# Patient Record
Sex: Female | Born: 1935 | Race: White | Hispanic: No | Smoking: Never smoker
Health system: Southern US, Community
[De-identification: ages and names within clinical notes are randomized; demographics above are authoritative.]

## PROBLEM LIST (undated history)

## (undated) DIAGNOSIS — M1990 Unspecified osteoarthritis, unspecified site: Secondary | ICD-10-CM

## (undated) DIAGNOSIS — Z8619 Personal history of other infectious and parasitic diseases: Secondary | ICD-10-CM

## (undated) DIAGNOSIS — Z85828 Personal history of other malignant neoplasm of skin: Secondary | ICD-10-CM

## (undated) DIAGNOSIS — R1031 Right lower quadrant pain: Secondary | ICD-10-CM

## (undated) DIAGNOSIS — D472 Monoclonal gammopathy: Secondary | ICD-10-CM

## (undated) DIAGNOSIS — I499 Cardiac arrhythmia, unspecified: Secondary | ICD-10-CM

## (undated) DIAGNOSIS — Z950 Presence of cardiac pacemaker: Secondary | ICD-10-CM

## (undated) DIAGNOSIS — K649 Unspecified hemorrhoids: Secondary | ICD-10-CM

## (undated) DIAGNOSIS — K219 Gastro-esophageal reflux disease without esophagitis: Secondary | ICD-10-CM

## (undated) DIAGNOSIS — D696 Thrombocytopenia, unspecified: Secondary | ICD-10-CM

## (undated) DIAGNOSIS — Z8489 Family history of other specified conditions: Secondary | ICD-10-CM

## (undated) DIAGNOSIS — C801 Malignant (primary) neoplasm, unspecified: Secondary | ICD-10-CM

## (undated) DIAGNOSIS — I5033 Acute on chronic diastolic (congestive) heart failure: Secondary | ICD-10-CM

## (undated) DIAGNOSIS — R002 Palpitations: Secondary | ICD-10-CM

## (undated) DIAGNOSIS — I10 Essential (primary) hypertension: Secondary | ICD-10-CM

## (undated) DIAGNOSIS — E039 Hypothyroidism, unspecified: Secondary | ICD-10-CM

## (undated) DIAGNOSIS — E785 Hyperlipidemia, unspecified: Secondary | ICD-10-CM

## (undated) DIAGNOSIS — D649 Anemia, unspecified: Secondary | ICD-10-CM

## (undated) DIAGNOSIS — I4891 Unspecified atrial fibrillation: Secondary | ICD-10-CM

## (undated) HISTORY — DX: Hyperlipidemia, unspecified: E78.5

## (undated) HISTORY — DX: Personal history of other infectious and parasitic diseases: Z86.19

## (undated) HISTORY — DX: Palpitations: R00.2

## (undated) HISTORY — DX: Essential (primary) hypertension: I10

## (undated) HISTORY — PX: CATARACT EXTRACTION: SUR2

## (undated) HISTORY — DX: Right lower quadrant pain: R10.31

## (undated) HISTORY — PX: INSERT / REPLACE / REMOVE PACEMAKER: SUR710

## (undated) HISTORY — DX: Monoclonal gammopathy: D47.2

## (undated) HISTORY — DX: Unspecified osteoarthritis, unspecified site: M19.90

## (undated) HISTORY — DX: Personal history of other malignant neoplasm of skin: Z85.828

## (undated) HISTORY — DX: Anemia, unspecified: D64.9

## (undated) HISTORY — DX: Acute on chronic diastolic (congestive) heart failure: I50.33

## (undated) HISTORY — DX: Thrombocytopenia, unspecified: D69.6

---

## 1994-11-14 HISTORY — PX: SHOULDER SURGERY: SHX246

## 1997-11-14 HISTORY — PX: CHOLECYSTECTOMY: SHX55

## 1998-07-14 ENCOUNTER — Other Ambulatory Visit: Admission: RE | Admit: 1998-07-14 | Discharge: 1998-07-14 | Payer: Self-pay | Admitting: Obstetrics and Gynecology

## 2000-01-20 ENCOUNTER — Emergency Department (HOSPITAL_COMMUNITY): Admission: EM | Admit: 2000-01-20 | Discharge: 2000-01-20 | Payer: Self-pay

## 2000-04-18 ENCOUNTER — Ambulatory Visit (HOSPITAL_COMMUNITY): Admission: RE | Admit: 2000-04-18 | Discharge: 2000-04-18 | Payer: Self-pay | Admitting: Gastroenterology

## 2002-04-16 ENCOUNTER — Encounter: Admission: RE | Admit: 2002-04-16 | Discharge: 2002-07-15 | Payer: Self-pay | Admitting: Orthopedic Surgery

## 2004-11-03 ENCOUNTER — Emergency Department (HOSPITAL_COMMUNITY): Admission: EM | Admit: 2004-11-03 | Discharge: 2004-11-03 | Payer: Self-pay | Admitting: *Deleted

## 2006-03-03 ENCOUNTER — Encounter: Admission: RE | Admit: 2006-03-03 | Discharge: 2006-03-03 | Payer: Self-pay | Admitting: Orthopedic Surgery

## 2006-08-14 ENCOUNTER — Ambulatory Visit: Payer: Self-pay | Admitting: Internal Medicine

## 2006-08-31 ENCOUNTER — Ambulatory Visit: Payer: Self-pay

## 2006-08-31 ENCOUNTER — Encounter: Payer: Self-pay | Admitting: Cardiology

## 2006-09-05 ENCOUNTER — Ambulatory Visit: Payer: Self-pay | Admitting: Internal Medicine

## 2006-12-11 ENCOUNTER — Ambulatory Visit: Payer: Self-pay | Admitting: Internal Medicine

## 2006-12-18 ENCOUNTER — Ambulatory Visit: Payer: Self-pay

## 2007-08-30 ENCOUNTER — Ambulatory Visit: Payer: Self-pay | Admitting: Internal Medicine

## 2007-11-19 ENCOUNTER — Ambulatory Visit: Payer: Self-pay | Admitting: Internal Medicine

## 2007-11-19 LAB — CONVERTED CEMR LAB
Alkaline Phosphatase: 70 units/L (ref 39–117)
Bilirubin, Direct: 0.2 mg/dL (ref 0.0–0.3)
Cholesterol: 179 mg/dL (ref 0–200)
LDL Cholesterol: 65 mg/dL (ref 0–99)
Total Bilirubin: 0.7 mg/dL (ref 0.3–1.2)
Total CHOL/HDL Ratio: 1.8
Total Protein: 6.9 g/dL (ref 6.0–8.3)
Triglycerides: 70 mg/dL (ref 0–149)

## 2008-01-10 ENCOUNTER — Ambulatory Visit: Payer: Self-pay

## 2008-01-25 ENCOUNTER — Ambulatory Visit: Payer: Self-pay

## 2008-01-25 LAB — CONVERTED CEMR LAB: TSH: 3.32 microintl units/mL (ref 0.35–5.50)

## 2008-02-05 ENCOUNTER — Ambulatory Visit: Payer: Self-pay | Admitting: Internal Medicine

## 2008-03-04 ENCOUNTER — Ambulatory Visit: Payer: Self-pay | Admitting: Internal Medicine

## 2008-04-10 ENCOUNTER — Ambulatory Visit: Payer: Self-pay | Admitting: Internal Medicine

## 2008-11-13 ENCOUNTER — Ambulatory Visit: Payer: Self-pay | Admitting: Internal Medicine

## 2008-11-19 ENCOUNTER — Ambulatory Visit: Payer: Self-pay | Admitting: Internal Medicine

## 2008-11-19 LAB — CONVERTED CEMR LAB
ALT: 14 units/L (ref 0–35)
AST: 15 units/L (ref 0–37)
Albumin: 3.6 g/dL (ref 3.5–5.2)
Alkaline Phosphatase: 74 units/L (ref 39–117)
Bilirubin, Direct: 0.2 mg/dL (ref 0.0–0.3)
Cholesterol: 182 mg/dL (ref 0–200)
Total Protein: 6.7 g/dL (ref 6.0–8.3)
Triglycerides: 76 mg/dL (ref 0–149)

## 2008-12-01 ENCOUNTER — Ambulatory Visit: Payer: Self-pay | Admitting: Cardiology

## 2008-12-02 ENCOUNTER — Observation Stay (HOSPITAL_COMMUNITY): Admission: EM | Admit: 2008-12-02 | Discharge: 2008-12-02 | Payer: Self-pay | Admitting: Emergency Medicine

## 2008-12-02 ENCOUNTER — Encounter: Payer: Self-pay | Admitting: Cardiovascular Disease

## 2008-12-15 DIAGNOSIS — E785 Hyperlipidemia, unspecified: Secondary | ICD-10-CM | POA: Insufficient documentation

## 2008-12-15 DIAGNOSIS — I10 Essential (primary) hypertension: Secondary | ICD-10-CM

## 2008-12-22 ENCOUNTER — Ambulatory Visit: Payer: Self-pay | Admitting: Internal Medicine

## 2009-01-13 ENCOUNTER — Ambulatory Visit: Payer: Self-pay

## 2009-05-29 ENCOUNTER — Encounter: Payer: Self-pay | Admitting: Internal Medicine

## 2009-06-01 ENCOUNTER — Encounter: Admission: RE | Admit: 2009-06-01 | Discharge: 2009-06-01 | Payer: Self-pay | Admitting: Family Medicine

## 2009-07-29 ENCOUNTER — Encounter (INDEPENDENT_AMBULATORY_CARE_PROVIDER_SITE_OTHER): Payer: Self-pay | Admitting: *Deleted

## 2009-09-18 ENCOUNTER — Encounter: Payer: Self-pay | Admitting: Internal Medicine

## 2009-09-21 ENCOUNTER — Ambulatory Visit: Payer: Self-pay | Admitting: Internal Medicine

## 2010-01-13 ENCOUNTER — Encounter: Payer: Self-pay | Admitting: Internal Medicine

## 2010-01-13 DIAGNOSIS — I6529 Occlusion and stenosis of unspecified carotid artery: Secondary | ICD-10-CM

## 2010-01-14 ENCOUNTER — Encounter: Payer: Self-pay | Admitting: Internal Medicine

## 2010-01-14 ENCOUNTER — Ambulatory Visit: Payer: Self-pay

## 2010-01-27 ENCOUNTER — Telehealth (INDEPENDENT_AMBULATORY_CARE_PROVIDER_SITE_OTHER): Payer: Self-pay | Admitting: *Deleted

## 2010-02-08 ENCOUNTER — Telehealth: Payer: Self-pay | Admitting: Internal Medicine

## 2010-08-16 ENCOUNTER — Ambulatory Visit: Payer: Self-pay | Admitting: Internal Medicine

## 2010-08-16 DIAGNOSIS — R079 Chest pain, unspecified: Secondary | ICD-10-CM | POA: Insufficient documentation

## 2010-08-16 DIAGNOSIS — R0602 Shortness of breath: Secondary | ICD-10-CM

## 2010-08-17 ENCOUNTER — Telehealth (INDEPENDENT_AMBULATORY_CARE_PROVIDER_SITE_OTHER): Payer: Self-pay | Admitting: *Deleted

## 2010-08-25 ENCOUNTER — Telehealth (INDEPENDENT_AMBULATORY_CARE_PROVIDER_SITE_OTHER): Payer: Self-pay | Admitting: Radiology

## 2010-08-26 ENCOUNTER — Ambulatory Visit: Payer: Self-pay | Admitting: Internal Medicine

## 2010-08-26 ENCOUNTER — Encounter: Payer: Self-pay | Admitting: Cardiology

## 2010-08-26 ENCOUNTER — Encounter (HOSPITAL_COMMUNITY)
Admission: RE | Admit: 2010-08-26 | Discharge: 2010-09-10 | Payer: Self-pay | Source: Home / Self Care | Attending: Internal Medicine | Admitting: Internal Medicine

## 2010-08-26 ENCOUNTER — Ambulatory Visit: Payer: Self-pay

## 2010-08-26 ENCOUNTER — Ambulatory Visit: Payer: Self-pay | Admitting: Cardiology

## 2010-08-31 ENCOUNTER — Telehealth: Payer: Self-pay | Admitting: Internal Medicine

## 2010-12-05 ENCOUNTER — Encounter: Payer: Self-pay | Admitting: Orthopedic Surgery

## 2010-12-16 NOTE — Progress Notes (Signed)
Summary: REFILL/ has been filled by pr   Phone Note Refill Request Message from:  Patient on February 08, 2010 11:11 AM  Refills Requested: Medication #1:  SYNTHROID 75 MCG TABS 1 tab once daily [facility] [id number] OPT 2  Initial call taken by: Judie Grieve,  February 08, 2010 11:11 AM  Follow-up for Phone Call        pt calling again, meds are running out, please call pt once sent, Migdalia Dk  February 09, 2010 11:39 AM   per pt calling stating that dr. [name] has been filled this meds. [phone] Lorne Skeens  February 10, 2010 11:42 AM     Prescriptions: SYNTHROID 75 MCG TABS (LEVOTHYROXINE SODIUM) 1 tab once daily  #90 x 3   Entered by:   Layne Benton, RN, BSN   Authorized by:   Sherrill Raring, MD, [facility]   Signed by:   Layne Benton, RN, BSN on 02/10/2010   Method used:   Electronically to        [facility] [facility]* (mail-order)             ,          Ph: [phone]       Fax: [phone]   RxID:   [id number]

## 2010-12-16 NOTE — Assessment & Plan Note (Signed)
Summary: Cardiology Nuclear Testing  Nuclear Med Background Indications for Stress Test: Evaluation for Ischemia   History: Echo, Myocardial Perfusion Study  History Comments: '07 NML MPI , 1/10 ECHO NML EF  Symptoms: Chest Tightness, Palpitations, SOB    Nuclear Pre-Procedure Cardiac Risk Factors: Carotid Disease, Family History - CAD, Hypertension, Lipids Caffeine/Decaff Intake: None NPO After: 8:30 PM Lungs: clear IV 0.9% NS with Angio Cath: 22g     IV Site: R Forearm IV Started by: Irean Hong, RN Chest Size (in) 42     Cup Size C     Height (in): 59 Weight (lb): 168 BMI: 34.05 Tech Comments: Held carvedilol 24 hrs.  Nuclear Med Study 1 or 2 day study:  1 day     Stress Test Type:  Eugenie Birks Reading MD:  Olga Millers, MD     Referring MD:  P.Ross Resting Radionuclide:  Technetium 69m Tetrofosmin     Resting Radionuclide Dose:  10.8 mCi  Stress Radionuclide:  Technetium 66m Tetrofosmin     Stress Radionuclide Dose:  33.0 mCi   Stress Protocol   Lexiscan: 0.4 mg   Stress Test Technologist:  Milana Na, EMT-P     Nuclear Technologist:  Domenic Polite, CNMT  Rest Procedure  Myocardial perfusion imaging was performed at rest 45 minutes following the intravenous administration of Technetium 85m Tetrofosmin.  Stress Procedure  The patient received IV Lexiscan 0.4 mg over 15-seconds.  Technetium 47m Tetrofosmin injected at 30-seconds.  There were no significant changes and freq pvcs/VBigemeny with infusion.  Quantitative spect images were obtained after a 45 minute delay.  QPS Raw Data Images:  Acquisition technically good; normal left ventricular size. Stress Images:  There is decreased uptake in the anterior wall. Rest Images:  There is decreased uptake in the anterior wall. Subtraction (SDS):  No evidence of ischemia. Transient Ischemic Dilatation:  1.09  (Normal <1.22)  Lung/Heart Ratio:  .30  (Normal <0.45)  Quantitative Gated Spect Images QGS cine  images:  Not gated   Overall Impression  Exercise Capacity: Lexiscan with no exercise. BP Response: Normal blood pressure response. Clinical Symptoms: Mild chest pain/dyspnea. ECG Impression: No significant ST segment change suggestive of ischemia. Overall Impression: Normal lexiscan nuclear study with soft tissue attenuation but no ischemia.  Appended Document: Cardiology Nuclear Testing No evidence of ischemia on myoview.  Chest pains do not appear to be due to blood flow problem  Appended Document: Cardiology Nuclear Testing Called patient with results.

## 2010-12-16 NOTE — Assessment & Plan Note (Signed)
Summary: ROV SL  Medications Added * I CAPS 1 tab once daily      Allergies Added: NKDA  Visit Type:  Follow-up   History of Present Illness: Patient is a 77 year old with a history of PVCs, aortic sclerosis and dyslipidemia.  I last saw her in November of last year SInce seen she notes occasion chest tightness.  Also complains of back pains.  Notes some SOB.  No significant palpitations.  Current Medications (verified): 1)  Aspirin 81 Mg Tbec (Aspirin) .... Take One Tablet By Mouth Daily 2)  Synthroid 75 Mcg Tabs (Levothyroxine Sodium) .Marland Kitchen.. 1 Tab Once Daily 3)  Quinapril Hcl 40 Mg Tabs (Quinapril Hcl) .Marland Kitchen.. 1 Tab Once Daily 4)  Caltrate .Marland Kitchen.. 1 Tab Two Times A Day 5)  Simvastatin 20 Mg Tabs (Simvastatin) .... Take One Tablet By Mouth Daily At Bedtime 6)  Carvedilol 3.125 Mg Tabs (Carvedilol) .... Take One Tablet By Mouth Twice A Day 7)  Nitrostat 0.4 Mg Subl (Nitroglycerin) .Marland Kitchen.. 1 Tablet Under Tongue At Onset of Chest Pain; You May Repeat Every 5 Minutes For Up To 3 Doses. 8)  I Caps .Marland Kitchen.. 1 Tab Once Daily  Allergies (verified): No Known Drug Allergies  Past History:  Past Medical History: Dyslipidemia Hypertension Palpitations (PVCs/bigeminy on event) Chest pain CV disease (moderate by USN 01/2010)  Review of Systems       All systems reviewed.  Neg to the above problem.  Vital Signs:  Patient profile:   77 year old female Height:      59 inches Weight:      169 pounds BMI:     34.26 Pulse rate:   65 / minute BP sitting:   132 / 60  (left arm) Cuff size:   regular  Vitals Entered By: Burnett Kanaris, CNA (August 16, 2010 11:29 AM)  Physical Exam  Additional Exam:  Patient is in NAD HEENT:  Normocephalic, atraumatic. EOMI, PERRLA.  Neck: JVP is normal. No thyromegaly. No bruits.  Lungs: clear to auscultation. No rales no wheezes.  Heart: Frequent skips.   Normal S1, S2. No S3.   No significant murmurs. PMI not displaced.  Abdomen:  Supple, nontender. Normal  bowel sounds. No masses. No hepatomegaly.  Extremities:   Good distal pulses throughout. No lower extremity edema.  Musculoskeletal :moving all extremities.  Neuro:   alert and oriented x3.    EKG  Procedure date:  08/16/2010  Findings:      NSR.  65 bpm.  First degree AV block.  Ventricular bigeminy.  Impression & Recommendations:  Problem # 1:  SHORTNESS OF BREATH (ICD-786.05) Patient wih some chst tightness and SOB.  I would recom a stress myoview as well as a holter monitor to evlauted.  Keep on same medicines for now.    Problem # 2:  CAROTID ARTERY DISEASE (ICD-433.10) Stable   FL/U in 2 years. Her updated medication list for this problem includes:    Aspirin 81 Mg Tbec (Aspirin) .Marland Kitchen... Take one tablet by mouth daily  Problem # 3:  HYPERTENSION, BENIGN (ICD-401.1) Good control. Her updated medication list for this problem includes:    Aspirin 81 Mg Tbec (Aspirin) .Marland Kitchen... Take one tablet by mouth daily    Quinapril Hcl 40 Mg Tabs (Quinapril hcl) .Marland Kitchen... 1 tab once daily    Carvedilol 3.125 Mg Tabs (Carvedilol) .Marland Kitchen... Take one tablet by mouth twice a day  Problem # 4:  HYPERLIPIDEMIA-MIXED (ICD-272.4) Keep on same regimen. Her updated medication  list for this problem includes:    Simvastatin 20 Mg Tabs (Simvastatin) .Marland Kitchen... Take one tablet by mouth daily at bedtime  Other Orders: EKG w/ Interpretation (93000) Holter (Holter) Nuclear Stress Test (Nuc Stress Test)  Patient Instructions: 1)  Your physician has recommended that you wear a holter monitor.  Holter monitors are medical devices that record the heart's electrical activity. Doctors most often use these monitors to diagnose arrhythmias. Arrhythmias are problems with the speed or rhythm of the heartbeat. The monitor is a small, portable device. You can wear one while you do your normal daily activities. This is usually used to diagnose what is causing palpitations/syncope (passing out). 2)  Your physician has requested  that you have a lexiscan myoview.  For further information please visit [email or web address]  Please follow instruction sheet, as given. 3)  Your physician wants you to follow-up in: 6 months  You will receive a reminder letter in the mail two months in advance. If you don't receive a letter, please call our office to schedule the follow-up appointment.

## 2010-12-16 NOTE — Progress Notes (Signed)
Summary: test results   Phone Note Call from Patient   Caller: Patient Reason for Call: Talk to Nurse, Lab or Test Results Summary of Call: pt wants to know results from monitor and xray. Initial call taken by: Roe Coombs,  August 31, 2010 3:34 PM  Follow-up for Phone Call        Spoke with pt. Pt. states she is calling to get the 24 hrs monitor results. Monitor was d/c last friday 10/14th/11 . I let pt. know Dr. [name] needs to review the monitor test results. We will call her back as soon as she does. The xrays pt is refering to, is the pictures taken during the stress test. Pt. aware. verbalized understanding. Follow-up by: Ollen Gross, RN, BSN,  August 31, 2010 4:16 PM     Appended Document: test results Called patient with Holter monitor results.

## 2010-12-16 NOTE — Progress Notes (Signed)
Summary: NUC PRE-PROCEDURE  Phone Note Outgoing Call   Call placed by: Domenic Polite, CNMT,  August 25, 2010 12:14 PM Call placed to: Patient Reason for Call: Confirm/change Appt Summary of Call: Reviewed information on Myoview Information Sheet (see scanned document for further details).  Spoke with patient.  Initial call taken by: Domenic Polite, CNMT,  August 25, 2010 12:14 PM     Nuclear Med Background Indications for Stress Test: Evaluation for Ischemia   History: Echo, Myocardial Perfusion Study  History Comments: '07 NML MPI , 1/10 ECHO NML EF  Symptoms: Chest Tightness, Palpitations, SOB    Nuclear Pre-Procedure Cardiac Risk Factors: Carotid Disease, Family History - CAD, Hypertension, Lipids Height (in): 59 Tech Comments: 3/11 40-59% RICA, 0-39% LICA

## 2010-12-16 NOTE — Progress Notes (Signed)
Summary: 24 hour holter monitor  Phone Note Outgoing Call Call back at Home Phone [phone]   Call placed by: Stanton Kidney, EMT-P,  August 17, 2010 4:53 PM Call placed to: Patient Action Taken: Phone Call Completed Summary of Call: Pt. scheduled for 24 holter monitor 08/26/10 after myoview appt.

## 2010-12-16 NOTE — Miscellaneous (Signed)
Summary: Orders Update  Clinical Lists Changes  Problems: Added new problem of CAROTID ARTERY DISEASE (ICD-433.10) Orders: Added new Test order of Carotid Duplex (Carotid Duplex) - Signed 

## 2010-12-16 NOTE — Progress Notes (Signed)
Summary: refill meds  Will check with Durwin Reges on 3.17,2011  to see if we filling synthroid. Phone Note Refill Request Call back at Home Phone [phone] Message from:  Patient on January 27, 2010 11:31 AM  Refills Requested: Medication #1:  SYNTHROID 75 MCG TABS 1 tab once daily   Supply Requested: 3 months [facility]    Method Requested: Fax to [facility] Initial call taken by: Lorne Skeens,  January 27, 2010 11:32 AM

## 2011-02-28 LAB — GLUCOSE, CAPILLARY

## 2011-02-28 LAB — CARDIAC PANEL(CRET KIN+CKTOT+MB+TROPI)
CK, MB: 2.9 ng/mL (ref 0.3–4.0)
Total CK: 41 U/L (ref 7–177)

## 2011-02-28 LAB — TROPONIN I: Troponin I: 0.19 ng/mL — ABNORMAL HIGH (ref 0.00–0.06)

## 2011-02-28 LAB — POCT I-STAT, CHEM 8
Calcium, Ion: 1.25 mmol/L (ref 1.12–1.32)
Chloride: 109 mEq/L (ref 96–112)
HCT: 46 % (ref 36.0–46.0)
Sodium: 138 mEq/L (ref 135–145)

## 2011-02-28 LAB — CBC
Hemoglobin: 14.4 g/dL (ref 12.0–15.0)
MCHC: 32.7 g/dL (ref 30.0–36.0)
Platelets: 178 10*3/uL (ref 150–400)
RDW: 13.1 % (ref 11.5–15.5)

## 2011-02-28 LAB — POCT CARDIAC MARKERS
Myoglobin, poc: 65.9 ng/mL (ref 12–200)
Troponin i, poc: 0.09 ng/mL (ref 0.00–0.09)
Troponin i, poc: <0.05 ng/mL (ref 0.00–0.09)

## 2011-02-28 LAB — CK TOTAL AND CKMB (NOT AT ARMC)
CK, MB: 2.5 ng/mL (ref 0.3–4.0)
Relative Index: INVALID (ref 0.0–2.5)

## 2011-02-28 LAB — DIFFERENTIAL
Basophils Absolute: 0 10*3/uL (ref 0.0–0.1)
Basophils Relative: 0 % (ref 0–1)
Lymphocytes Relative: 4 % — ABNORMAL LOW (ref 12–46)
Monocytes Absolute: 0 10*3/uL — ABNORMAL LOW (ref 0.1–1.0)
Neutro Abs: 9.1 10*3/uL — ABNORMAL HIGH (ref 1.7–7.7)
Neutrophils Relative %: 96 % — ABNORMAL HIGH (ref 43–77)

## 2011-02-28 LAB — TSH: TSH: 0.649 u[IU]/mL (ref 0.350–4.500)

## 2011-03-02 ENCOUNTER — Other Ambulatory Visit: Payer: Self-pay | Admitting: Orthopedic Surgery

## 2011-03-02 DIAGNOSIS — R52 Pain, unspecified: Secondary | ICD-10-CM

## 2011-03-05 ENCOUNTER — Other Ambulatory Visit: Payer: Self-pay

## 2011-03-07 ENCOUNTER — Ambulatory Visit
Admission: RE | Admit: 2011-03-07 | Discharge: 2011-03-07 | Disposition: A | Discharge status: Home / Self Care | Payer: Medicare Other | Source: Ambulatory Visit | Attending: Orthopedic Surgery | Admitting: Orthopedic Surgery

## 2011-03-07 DIAGNOSIS — R52 Pain, unspecified: Secondary | ICD-10-CM

## 2011-03-08 ENCOUNTER — Other Ambulatory Visit: Payer: Self-pay

## 2011-03-26 ENCOUNTER — Other Ambulatory Visit: Payer: Self-pay | Admitting: Internal Medicine

## 2011-03-29 NOTE — Assessment & Plan Note (Signed)
[facility]                            CARDIOLOGY OFFICE NOTE   NAME:[name], [name]                  MRN:          [id number]  DATE:04/10/2008                            DOB:          1925/04/01    IDENTIFICATION:  The patient is an 77 year old woman with a history of  palpitations (PVCs/bigeminy on event monitor), chest pressure, shortness  of breath.  I last saw her back in March.  Note, she has had a normal  Myoview and echocardiogram.  Echo showed only mild mitral regurgitation.   Since seen, the patient has done well.  She denies palpitations,  actually.  No shortness of breath.  Notes occasional lower extremity  edema, she does have varicosities.   CURRENT MEDICINES:  1. Aspirin 81.  2. Synthroid 75 mcg.  3. Quinapril 40.  4. Zocor 20.  5. Caltrate b.i.d.   PHYSICAL EXAMINATION:  The patient is in no distress.  Blood pressure is 164/84 (patient was stressed because of traffic this  morning), pulse is 63 and regular, weight 179.  Her lungs are clear.  CARDIAC EXAM:  Regular rate and rhythm, S1, S2, no S3, no murmurs.  ABDOMEN:  Benign, obese.  EXTREMITIES:  No edema.   A 12-lead EKG normal sinus rhythm, 63 beats per minute with PR intervals  232 ms.   IMPRESSION:  1. Palpitations:  Event monitor showed isolated premature ventricular      contractions, occasional bigeminy.  The patient is asymptomatic, I      would not treat further unless this changes.  Average heart rate on      Holter monitor was 68.  Again, anything could slow her down and      make her feel worse potentially.  I do not think that in the      setting of normal left ventricular function, there is any      significant concern for these premature ventricular contractions.  2. History of chest pressure:  Again, normal Myoview.  Patient      currently asymptomatic.  3. Dyslipidemia:  Good control on statin.   Overall, I think the patient is doing well.  I will  set to see her back  in the winter.  She is being evaluated for cataract surgery.  I think  from a cardiac standpoint she has a low risk for any cardiac event.     Pricilla Riffle, MD, [facility]  Electronically Signed    PVR/MedQ  DD: 04/10/2008  DT: 04/10/2008  Job #: [id number]   cc:   Carin Hock. Epes, M.D.  [facility]

## 2011-03-29 NOTE — Assessment & Plan Note (Signed)
[facility]                            CARDIOLOGY OFFICE NOTE   NAME:[name], [name]                  MRN:          [id number]  DATE:01/25/2008                            DOB:          1925-06-20    IDENTIFICATION:  [name] is an 77 year old woman with a history of  palpitations/PVCs, chest pressure and shortness of breath I last saw her  back in October.  The patient also has a history of moderate  cerebrovascular disease.   Since seen, she had an episode of chest pain on Friday.  She went to bed  and woke up on Saturday and her chest was still hurting though she said  it was not as bad.  She went to the Urgent Care ([facility]) on  Saturday. An EKG was done that was unchanged from previous.  They felt  it was probably some gas pain and she was sent home.   She still gets occasional chest and back pain that is intermittent,  occurs with and without activity.  She has noted no change in her  ability to do things.  Her breathing has been okay.  She notes  occasional palpitations, worse in the middle of night if she gets up to  go to the bathroom.   CURRENT MEDICATIONS:  Include aspirin 81, Synthroid 75, quinapril 40,  Caltrate b.i.d. and Zocor 20 q.h.s.   PHYSICAL EXAM:  The patient is in no distress.  Blood pressure is  130/80, pulse is 60 and regular, weight 180 up 3 pounds from previous.  LUNGS are clear.  CARDIAC exam regular rate rhythm, S1-S2 no S3, occasional skip noted.  No murmurs.  ABDOMEN:  Benign.  EXTREMITIES:  No edema.  Varicosities noted.   Carotid studies done 02/26 showed stable mild carotid disease with a 40-  59% bilateral ICA stenosis.  Recommend follow-up in 1 year.  12-lead EKG  shows sinus rhythm, first-degree AV block with a PR interval 224  milliseconds.  Occasional PVCs.   IMPRESSION:  1. Chest pain.  Again I am not convinced it is cardiac. It was      atypical.  She has noted no change in this plan on risk  factor      modification.  2. Palpitations.  Will set up for a 24-hour Holter.  3. CV disease.  Continue to follow up on a yearly basis as needed.  4. Dyslipidemia.  Lipid panel done this January showed great control      with an LDL of 65, HDL of 100, total cholesterol 179.   The patient was given a follow-up in October and a refill prescription  for nitroglycerine. If her symptoms change or worsen I would be happy to  see her sooner.     Pricilla Riffle, MD, [facility]  Electronically Signed    PVR/MedQ  DD: 01/27/2008  DT: 01/27/2008  Job #: [id number]   cc:   [facility] in [place]

## 2011-03-29 NOTE — Assessment & Plan Note (Signed)
[facility]                            CARDIOLOGY OFFICE NOTE   NAME:Simmon, SHANEL PRAZAK                  MRN:          [id number]  DATE:12/22/2008                            DOB:          1925-10-09    IDENTIFICATION:  Ms. Gramajo is an 77 year old woman with a history of  palpitations, chest pressure, dyspnea.  I last saw her in December 31.   In the interval, the patient was actually admitted to the Inpatient  Cardiology Service for palpitations, this was on January 18.  She had a  steroid shot the day prior and around 5 p.m., she was at her daughter's  house when she began having more palpitations with chest tightness  became persistent and she was brought to the emergency room.  There in  the emergency room, systolic blood pressure was elevated at 160.  Her  blood glucose was elevated at 240.  She was observed and sent home on  January 18.  On January 19, Coreg was added to her regimen.   Since discharge, she is back towards baseline.  She has only  intermittent palpitations.   CURRENT MEDICATIONS:  1. Aspirin 81.  2. Synthroid 75.  3. Quinapril 40.  4. Zocor 20.  5. Caltrate b.i.d.  6. Carvedilol 3.125 b.i.d.  7. Eye drops.   REVIEW OF SYSTEMS:  Denies dizziness, occasional shortness of breath  when she pushes things, no significant palpitations.   PHYSICAL EXAMINATION:  GENERAL:  The patient is in no distress.  VITAL SIGNS:  Blood pressure 117/66, pulse 60 and regular, weight 173.  LUNGS:  Clear.  CARDIAC:  Regular rate and rhythm.  S1, S2.  No S3, no murmurs.  ABDOMEN:  Benign, obese.  EXTREMITIES:  No edema.   A 12-lead EKG, sinus rhythm 60 beats per minute.  First-degree AV block  with PR interval of 242 milliseconds.  Possible septal infarct.  Unchanged from previous.   IMPRESSION:  1. Palpitations, asymptomatic on the Coreg.  I would keep her on this,      looking at her last EKG.  Her PR interval is minimally increased.  2. Dyslipidemia.  The patient's lipids from January are great would      continue.   I will set to see the patient back in the fall sooner if problems  develop.     Pricilla Riffle, MD, [facility]  Electronically Signed    PVR/MedQ  DD: 12/22/2008  DT: 12/23/2008  Job #: [id number]

## 2011-03-29 NOTE — Assessment & Plan Note (Signed)
[facility]                            CARDIOLOGY OFFICE NOTE   NAME:[name], [name]                  MRN:          [id number]  DATE:08/30/2007                            DOB:          09-30-1925    IDENTIFICATION:  Ms. Urizar is an 77 year old woman who I follow in  clinic, I last saw back in January.  She has a history of palpitations,  premature ventricular contractions, chest pressure, shortness of breath.  Note, she has been seen by Dr. Sampson Goon in the past.   Since seeing her, she notes some chest pain, but she attributes it more  to her back.  It occurs without activity and when her back hurts, it can  hurt.  No real chest discomfort with activity.  In fact, she was out at  [facility] the other day and denied chest discomfort.   She still feels fluttering in her chest area, more when she lays to one  side.  If she turns she does not feel as much.  She has venous  insufficiency in her left leg, has a little more swelling with some  discomfort.   CURRENT MEDICATIONS:  1. Aspirin 81.  2. Synthroid 75 mcg.  3. Quinapril 40.  4. Caltrate b.i.d.   PHYSICAL EXAMINATION:  The patient is in no distress.  Blood pressure is 130/72, pulse is 71 and regular.  Weight 177, up from  175 last visit.  LUNGS:  Clear.  CARDIAC EXAM:  Regular rate and rhythm, S1 and S2, occasional skip, no  S3, no significant murmurs.  ABDOMEN:  Benign.  EXTREMITIES:  No significant edema.  Varicosities left greater than  right lower leg.   IMPRESSION:  1. Palpitations:  Again, relatively asymptomatic.  I would continue to      follow.  We can try a calcium channel blocker if they worsen, or      possibly half of metoprolol.  2. Health care maintenance:  Carotid Dopplers showed she has moderate      narrowing.  Will need to follow up in the spring.  I would put her      on Zocor 20 and followup lipids.  3. Venous insufficiency:  I have given her the address  for elastic      therapy.  4. Chest pain:  I am not convinced this is cardiac.  It sounds more      muscular, and I encouraged her to increase stretching exercises and      try to improve her posture.   I will set to see the patient back in March, sooner if problems develop.  I will be in touch with her with the blood work.     Pricilla Riffle, MD, [facility]  Electronically Signed    PVR/MedQ  DD: 08/30/2007  DT: 08/31/2007  Job #: [id number]   cc:   Charlie Pitter

## 2011-03-29 NOTE — Discharge Summary (Signed)
NAMEBERNELL, HAYNIE           ACCOUNT NO.:  [id number]   MEDICAL RECORD NO.:  [id number]          PATIENT TYPE:  OBV   LOCATION:  2505                         FACILITY:  [facility]   PHYSICIAN:  Noralyn Pick. Eden Emms, MD, FACCDATE OF BIRTH:  May 21, 1925   DATE OF ADMISSION:  12/01/2008  DATE OF DISCHARGE:                               DISCHARGE SUMMARY   ADDENDUM:  The patient was noted slight bump in troponin was negative.  CK-MB since this admission, troponin at 0.19 and 0.20.  Discussed with  Dr. Charlton Haws.  The patient with normal 2-D echocardiogram, okay to  be discharged home and followup with Dr. [name] as previously stated.      Dorian Pod, ACNP      Noralyn Pick. Eden Emms, MD, [facility]  Electronically Signed    MB/MEDQ  D:  12/02/2008  T:  12/03/2008  Job:  [id number]

## 2011-03-29 NOTE — H&P (Signed)
[name], [name]           ACCOUNT NO.:  [id number]   MEDICAL RECORD NO.:  [id number]          Dickson TYPE:  OBV   LOCATION:  2505                         FACILITY:  [facility]   PHYSICIAN:  Darryl D. Prime, MD    DATE OF BIRTH:  10/04/1925   DATE OF ADMISSION:  12/01/2008  DATE OF DISCHARGE:                              HISTORY & PHYSICAL   CARDIOLOGIST:  Dr. [name].   CHIEF COMPLAINT:  1. Heart racing.  2. Headache.   HISTORY OF PRESENT ILLNESS:  [name] is an 77 year old female with  a history of lifelong palpitations.  She has a history of PVCs and  bigeminy on event monitoring.  This has been associated with chest pain  and shortness of breath in the past, who presents with tachy  palpitations, chest pain, and headache.  The Dickson notes her symptoms  have been relatively stable, but had a steroid shot on the day prior to  admission of the left knee for degenerative joint disease because of  significant pain there, and notes around 2 p.m. some hours after getting  the injection, developed a headache.  She notes around 5 p.m. she  developed significant palpitations and chest tightness that has been  persistent.  The chest tightness is described as substernal with no  radiation.  She notes the chest pain and tachy palpitations would go  away when she walks around, but returned with sitting and lying down.  The Dickson, in the emergency room, was found to have a blood pressure  systolic in the 160-range, which is elevated for her; and her blood  sugar is noted at 240, which is new for her.   PAST MEDICAL AND SURGICAL HISTORY:  1. She is a history of peripheral vascular disease with bilateral ICA      stenosis.  2. She is status post C-sections.  3. She has had a history of cataract surgery.  4. Status post gallbladder surgery.  5. She has a history of right rotator cuff surgery.  6. The Dickson has a history of hyperlipidemia.  7. The Dickson has a history of  palpitations as above with associated      chest pain with a normal Myoview in 2007, normal ejection fraction      by echo in 2007.   ALLERGIES:  No known drug allergies.   MEDICATIONS:  1. Aspirin 81 mg daily.  2. Synthroid 75 mcg daily.  3. Quinapril 40 mg daily.  4. Caltrate D twice a day.  5. She is on Zocor.  She is unsure of the dose.   SOCIAL HISTORY:  She has never smoked, drank, or used illicit drugs.  She is widowed and lives alone currently.   FAMILY HISTORY:  Her father had a pacemaker placed, mother had  Alzheimer's disease.   REVIEW OF SYSTEMS:  Her 14-point review of systems negative unless  stated above.   PHYSICAL EXAMINATION:  VITAL SIGNS:  Blood pressure is now 146/65.  She  was given nitroglycerin in the ER, which relieved some of her symptoms.  Temperature is 98.2 with a pulse of 96, respiratory rate  18, saturations  are 100% on room air.  GENERAL:  This is a female who looks much younger than stated age,  sitting upright in no acute distress.  HEENT:  Normocephalic/atraumatic.  Eye exam:  The left pupil is in the  range of 3 mm.  Right pupil is in range of 2 mm. Extraocular movements  intact.  The oropharynx reveals no posterior pharyngeal lesions.  Neck  is supple with no lymphadenopathy or thyromegaly.  The Dickson's  oropharynx reveals no posterior pharyngeal lesions.  NECK:  Supple with no lymphadenopathy or thyromegaly.  No carotid  bruits.  LUNGS:  Clear to auscultation bilaterally.  CARDIOVASCULAR:  Regular rhythm and rate.  No murmurs, rubs, or gallops.  ABDOMEN:  Soft, nontender, and nondistended with no hepatosplenomegaly.  EXTREMITIES:  Show no clubbing, cyanosis, or edema.  NEUROLOGIC:  She is alert and oriented x4.  Cranial nerve II-XII grossly  intact.  Strength and sensation grossly intact.   LABORATORY DATA:  Shows a white count of 9.5 with a hemoglobin of 14.4,  hematocrit 44.3, platelets 178.  Sodium 138 with a potassium of 5.1,   chloride 109, bicarb 25, BUN 25, creatinine 0.8 with a glucose of 240.  Cardiac markers at 1:58 a.m. and 1:02 in the morning were negative.  EKG  shows normal sinus rhythm at a rate of 96 beats per minute, PR interval  212, QRS 82,  QT corrected 455.  She had multifocal PVCs and left  ventricular hypertrophy.  There is an abnormal precordial lead  placement.  Chest x-ray showed no signs of acute cardiopulmonary  disease.   ASSESSMENT/PLAN:  This is a Dickson who presents with a history of  tachycardia/palpitations and chest pain who presents with similar  symptoms.  She notes that she has had these symptoms for years; and she  notes that the headaches, however, is new.  These symptoms may have been  precipitated by the steroid injection, in addition to her elevated blood  pressure and her increased glucose.  At this time, she will be admitted  for observation, following her blood pressure closely.  She apparently  has had some relief of her PVCs with verapamil in the past, and if she  was found to have a significant burden, this may be suggested.  For her  elevated blood sugars, we will follow this closely.  If it continues to  go up, she may need be treated for this, although, I suspect is likely  due to steroids and will normalize.  We will check a TSH.        Darryl D. Prime, MD  Electronically Signed     DDP/MEDQ  D:  12/02/2008  T:  12/02/2008  Job:  [id number]

## 2011-03-29 NOTE — Assessment & Plan Note (Signed)
[facility]                            CARDIOLOGY OFFICE NOTE   NAME:Dickson, Linda ALKHATIB                  MRN:          [id number]  DATE:11/13/2008                            DOB:          [birth year]    IDENTIFICATION:  [name] is an 77 year old woman with history of  palpitations (PVCs/bigeminy on event monitor), chest pressure, dyspnea.  I last saw her back in May.   In the interval, she states that she feels her heart beating and  sometimes usually if she lay on her left side.  She will turn to the  right and back on the left and she will be okay.  Rare chest pain not  associated with any particular activity.   CURRENT MEDICINES:  1. Aspirin 81 mg daily.  2. Synthroid 75 mcg.  3. Quinapril 40.  4. Caltrate b.i.d.  5. Zocor 50 at bedtime.   PAST MEDICAL HISTORY:  As noted above plus the patient with  cerebrovascular disease with bilateral ICA stenoses.   PHYSICAL EXAMINATION:  GENERAL:  The patient is in no distress.  VITAL SIGNS:  Blood pressure is 120/64, pulse is 77, weight 177.  NECK:  JVP is normal.  LUNGS:  Clear.  No rales.  CARDIAC:  Regular rate and rhythm, S1 and S2.  No S3.  No murmurs.  ABDOMEN:  Benign.  EXTREMITIES:  No edema.   LABORATORY DATA:  A 12-lead EKG, normal sinus rhythm, first-degree AV  block with PR interval of 232 msec.  Nonspecific T-wave changes.  Early  transition.  LVH with nonspecific ST changes.   IMPRESSION:  1. Palpitations, relatively asymptomatic with follow up.  Note, the      patient had an echocardiogram that showed normal LV function done      in 2007.  2. Hypertension, adequate control.  3. Chest pressure, normal Myoview in the past, relatively asymptomatic      now.  Myoview again was done in 2007.  4. Dyslipidemia.  Last lipid panel was in January 2009.  We will      repeat at her convenience.  5. Cardiovascular disease.  We will need repeat carotid scans coming      up year.  6.  Degenerative joint disease.  The patient being evaluated for a knee      problems.  From presurgical standpoint, if she is being evaluated,      I think she would be low and acceptable risk to proceed with      surgery if she needs this.   Otherwise, I will set follow up for next to winter, sooner if problems  develop.     Pricilla Riffle, MD, [facility]  Electronically Signed    PVR/MedQ  DD: 11/13/2008  DT: 11/14/2008  Job #: [id number]   cc:   Carin Hock. Epes, M.D.

## 2011-03-29 NOTE — Discharge Summary (Signed)
NAMESAVANA, Dickson           ACCOUNT NO.:  [id number]   MEDICAL RECORD NO.:  [id number]          PATIENT TYPE:  OBV   LOCATION:  2505                         FACILITY:  [facility]   PHYSICIAN:  Noralyn Pick. Eden Emms, MD, FACCDATE OF BIRTH:  1925/06/28   DATE OF ADMISSION:  12/02/2008  DATE OF DISCHARGE:  12/02/2008                               DISCHARGE SUMMARY   DISCHARGING PHYSICIAN:  Theron Arista C. Eden Emms, MD, [facility]   PRIMARY CARDIOLOGIST:  Pricilla Riffle, MD, [facility]   DISCHARGE DIAGNOSIS:  Palpitations.  Status post 12-lead EKG this  admission showing sinus rhythm with multifocal PVCs and LVH.  A 2D  echocardiogram showing normal ejection fraction.  The patient was  started on low-dose beta-blocker by Dr. Charlton Haws.  The patient to  follow up with Dr. [name], outpatient for further evaluation.   MEDICATIONS AT THE TIME OF DISCHARGE:  1. Coreg 3.125 mg b.i.d. which is new.  2. Aspirin 81 mg daily.  3. Synthroid 75 mcg daily.  4. Caltrate D as previously prescribed.  5. Zocor 20 mg daily.  6. Monopril 40 mg daily.   I have scheduled the patient to follow up with Dr. [name] on December 22, 2008, at 10:15.  The patient with less than 24-hour observation.   DISCHARGE TIME:  Less than 30 minutes.      Dorian Pod, ACNP      Noralyn Pick. Eden Emms, MD, [facility]  Electronically Signed    MB/MEDQ  D:  12/02/2008  T:  12/03/2008  Job:  [id number]

## 2011-04-01 NOTE — Assessment & Plan Note (Signed)
[name]                              CARDIOLOGY OFFICE NOTE   NAME:Schurman, [name]                  MRN:          [id number]  DATE:09/05/2006                            DOB:          Aug 02, 1925    IDENTIFICATION:  [name] is a 77 year old woman who I saw for Linda first  time back on August 14, 2006.  She had episodes of palpitations, PVC's,  chest pressure and shortness of breath.   I was concerned about her shortness of breath more than Linda chest pressure,  (which seemed more related to back pain.)  I went ahead and scheduled her  for a Myoview scan; she had this done on August 31, 2006, and showed normal  perfusion.  EF was not gaited because of PVC's.   [name] also had an echocardiogram done, and this showed normal LV and  RV size.  LVF was 60%.  There was mild regurgitation.  Note, pulmonary  artery pressure was estimated at 44 mm of mercury. Note again, throughout  Linda study she had frequent PVC's.   Since seeing [name], has done about Linda same.  Note, she brings in  today her report of her Health Fair Screen, and it did show some mild  carotid stenoses.  Again, echo findings almost, as we note, forgot to say  her echo report shows some mild diastolic dysfunction.   She denies dizziness any skips, actually does not notice Linda skips much.   CURRENT MEDICATIONS:  1. Aspirin 81 mg q.d.  2. Synthroid 75 mcg.  3. Quinapril 40 q.d.   PHYSICAL EXAMINATION:  GENERAL:  Dickson is in no distress.  VITAL SIGNS:  Blood pressure 132/70.  Pulse is 78.  Weight 178.  LUNGS:  Clear.  CARDIAC:  Regular rate and rhythm with occasional skip.  S1 and S2.  Grade  1/6 systolic murmur heard best at Linda apex.  ABDOMEN:  Benign.  EXTREMITIES:  Good pulses.  No edema.   IMPRESSION:  1. Shortness of breath and chest pressure.  Again, she has very mild      diastolic dysfunction.  I am not sure this explains it.  I have      encouraged  her to increase her activity level to see with training how      she improves.  She can also 1/2 of Metoprolol; it may decrease Linda      skips and make her feel better.  Again, Dr. Sampson Goon had seen her in      Linda past, and thought they were isolated premature ventricular      contractions from Linda left ventricle and suggested Verapamil, though      she has not taken.  Again, another consideration.  2. Healthcare maintenance lipid panel from her Health Fair Screen showed      an LDL of 130, HDL of 90.  She has some cholesterol buildup in Linda      carotids based on this.  I would go ahead and try a low-dose Zocor and      see if we can bring it  down and stabilize plaques.  Again, Dickson      agrees to this.  Would follow up lipids in 8 weeks' time with liver      function tests.  3. I would like to see [name] back later in Linda winter, sooner if      problems develop.    ______________________________  Pricilla Riffle, MD, [facility]    PVR/MedQ  DD: 09/05/2006  DT: 09/06/2006  Job #: [id number]   cc:   [facility]

## 2011-04-01 NOTE — Assessment & Plan Note (Signed)
[facility]                            CARDIOLOGY OFFICE NOTE   NAME:Mauceri, ADRIANE GABBERT                  MRN:          [id number]  DATE:12/11/2006                            DOB:          1924/11/24    IDENTIFICATION:  [name] is a 77 year old woman whom I saw back in  October.  She has a history of palpitations, PVCs, chest pressure, and  shortness of breath.   When I saw her last, I encouraged her to increase her activity, or to  try half of metoprolol, but she has not done this.  I did place her on  Zocor because she said that she had carotid findings that suggested  vascular disease of the carotids.  She did not tolerate this, felt bad,  nauseated, achy, stopped it, started again, felt the same, stopped it.   CURRENT MEDICATIONS:  1. Aspirin 81 mg daily.  2. Synthroid 75 mcg daily.  3. Quinapril 40 daily.  4. Fosamax stopped.   REVIEW OF SYSTEMS:  Overall, the patient is feeling well, denies  palpitations.  No chest pressure.  No shortness of breath.   PHYSICAL EXAM:  Blood pressure 136/70, pulse 80 and regular, weight 175  down from 178 in October.  The patient is in no acute distress.  NECK:  JVP is normal.  No audible bruits.  LUNGS:  Clear.  CARDIAC:  Regular rate and rhythm.  S1, S2.  No S3.  No murmurs.  ABDOMEN:  Benign, obese.  EXTREMITIES:  No significant edema.  Some varicosities.   A 12-lead EKG normal sinus rhythm 80 beats per minute, first degree AV  block with PR interval 0.21 seconds.  Frequent PVCs.   IMPRESSION:  1. Palpitations.  The patient is asymptomatic and she has been seen by      Dr. Sampson Goon at [facility] before.  He thought she had isolated      premature ventricular contractions from the left ventricle,      suggested verapamil, which she did not take.  Again, a      consideration.  2. Health care maintenance.  We will set her up for carotid studies      here to document if we need to pursue lipid  lowering further.   Otherwise, encouraged her to stay active.  I will set to see her back in  about a year.  We will be in touch with her regarding her lipids.     Pricilla Riffle, MD, [facility]  Electronically Signed    PVR/MedQ  DD: 12/11/2006  DT: 12/11/2006  Job #: [id number]   cc:   [facility]

## 2011-04-01 NOTE — Op Note (Signed)
. [facility]  Patient:    [name], [name]                  MRN: [id number] Proc. Date: 04/18/00 Adm. Date:  [id number] Disc. Date: [id number] Attending:  Charna Elizabeth CC:         Gabriel Earing, M.D., [facility] [address] [place],                           Operative Report  DATE OF BIRTH:  03/11/1925.  REFERRING PHYSICIAN: Gabriel Earing, M.D.  PROCEDURE PERFORMED:  Colonoscopy.  ENDOSCOPIST:  Anselmo Rod, M.D.  INSTRUMENT USED:  Olympus video colonoscope.  INDICATION FOR PROCEDURE:  Guaiac-positive stool in a 77 year old white female; rule out polyps, hemorrhoids, masses, etc.  PREPROCEDURE PREPARATION:  Informed consent was procured from the patient. The patient was fasted for eight hours prior to the procedure and prepped with a bottle of magnesium citrate and a gallon of NuLytely the night prior to the procedure.  PREPROCEDURE PHYSICAL:  VITAL SIGNS:  Patient had stable vital signs.  NECK:  Supple.  CHEST:  Clear to auscultation.  S1 and S2 regular.  No murmur, rub or gallop. No rales, rhonchi or wheezing.  ABDOMEN:  Soft with normal abdominal bowel sounds.  DESCRIPTION OF PROCEDURE:  The patient was placed in the left lateral decubitus position and sedated with Demerol 30 mg and Versed 2 mg intravenously.  Once the patient was adequately sedate and maintained on low-flow oxygen and continuous cardiac monitoring, the Olympus video colonoscope was advanced from the rectum to the cecum without difficulty. Except for a few left-sided diverticula and small external and internal hemorrhoids, no other abnormalities were seen.  No masses, polyps, erosions or ulcerations were present.  The patient tolerated the procedure well.  The procedure was completed up to the cecum.  IMPRESSION: 1. A few left-sided diverticula. 2. Small internal and external hemorrhoids.  RECOMMENDATIONS: 1. The patient has been advised to  increase the fluid and fiber in her diet. 2. Outpatient followup is advised in the office in the next two weeks for    repeat guaiac testing; further recommendation will be made at that time. DD:  04/18/00 TD:  04/21/00 Job: [id number]

## 2011-04-01 NOTE — Assessment & Plan Note (Signed)
[facility]                              CARDIOLOGY OFFICE NOTE   NAME:[name], [name]                  MRN:          [id number]  DATE:08/14/2006                            DOB:          1925/03/30    [name] is a 77 year old woman who was referred for evaluation of  shortness of breath with exertion, some chest pain.   HISTORY OF PRESENT ILLNESS:  The patient has no known coronary artery  disease.  Note, she had a stress test done; it sounds like an adenosine  Cardiolite back in 2000.  She was seen at [facility] by Dr. Sampson Goon for  PVCs.  This was negative for ischemia.   The patient has noted episodes of chest pain both front and back that occur  occasionally with and without activity.  She also notes some increased  shortness of breath with activity.  She takes things as tolerated.   She was seen by her primary physician on September 19 and, with these  complaints, was told to take an aspirin every day, nitroglycerin as needed,  and Toprol XL 25 daily.  She has not taken this.   The patient denies any episodes that have awoken her up from sleep.  She has  not had any PND.  Denies palpitations ever.   CURRENT MEDICATIONS:  1. Aspirin 81 mg daily.  2. Synthroid 75 mcg daily.  3. Quinapril 40 daily.  4. Zyrtec p.r.n.  5. Nitroglycerin p.r.n.  6. Tylenol p.r.n.  7. TUMS p.r.n.   PAST MEDICAL HISTORY:  1. PVCs seen by Dr. Clydie Braun at [facility].  The patient did not      have a followup. It sounds like they have been benign.  2. Hypothyroidism.  3. Status post cholecystectomy.  4. Cesarean section x2.   ALLERGIES:  No known drug allergies.   SOCIAL HISTORY:  The patient is widowed.  Does not smoke.  Does not drink.   FAMILY HISTORY:  Significant for CAD in her father.   REVIEW OF SYSTEMS:  All systems reviewed.  Negative to the above problem  except as noted.  The patient has had chest pain over the past couple  of  years and there is no real increase in this.   PHYSICAL EXAMINATION:  The patient is in no distress.  Blood pressure is 135/68.  Pulse is 81.  Weight 178.  LUNGS:  Clear.  NECK:  No bruits.  CARDIAC:  Regular rate and rhythm with frequent skips, S1, S2.  No S3.  No  significant murmurs.  ABDOMEN:  No hepatomegaly.  EXTREMITIES:  Good distal pulses.  No edema.   Twelve-lead EKG:  Normal sinus rhythm, 81 beats per minute.  Frequent  PVCs/occasional couplet.  Right bundle morphology with 1 left bundle.   IMPRESSION:  1. Shortness of breath, chest pain, chest pressure.  I am more concerned      about the shortness of breath.  Chest pressure, I am not convinced it      is cardiac;  her history, though is difficult.  I would like to  schedule an adenosine Myoview to evaluate.  We will be in touch with      the patient.  For now, take activities only as tolerated.  Has      nitroglycerin to take as needed.  Note, lipids show an HDL of 91 and LDL of  131.  Again, some protective  value but, with her age and symptoms, it is important to evaluate further.  1. With her shortness of breath, I would also like to get an      echocardiogram to evaluate for any increased pulmonary pressures.      Again, does not appear to be on examination but we will see, also with      her PVCs important as well.  2. PVCs, asymptomatic.  She does have quite frequent PVCs on EKG.  For      now, we will see what the Myoview and echocardiogram show.  May,      indeed, get a Holter monitor even if no other testing is scheduled.   I will be in touch with the patient regarding the test results and where to  proceed.   ADDENDUM:  Final evaluation found from Dr. Sampson Goon.  Felt to be PVCs,  left ventricular site of origin, sensitive to verapamil if she needed it,  which she has not.  Again, may indeed schedule for Holter to evaluate PVC  burden.            ______________________________  Pricilla Riffle, MD, [facility]     PVR/MedQ  DD:  08/14/2006  DT:  08/15/2006  Job #:  [id number]   cc:   [facility]

## 2011-04-15 ENCOUNTER — Encounter: Payer: Self-pay | Admitting: Internal Medicine

## 2011-05-02 ENCOUNTER — Encounter: Payer: Self-pay | Admitting: Internal Medicine

## 2011-05-02 ENCOUNTER — Ambulatory Visit (INDEPENDENT_AMBULATORY_CARE_PROVIDER_SITE_OTHER): Payer: Medicare Other | Admitting: Internal Medicine

## 2011-05-02 DIAGNOSIS — I10 Essential (primary) hypertension: Secondary | ICD-10-CM

## 2011-05-02 DIAGNOSIS — I119 Hypertensive heart disease without heart failure: Secondary | ICD-10-CM

## 2011-05-02 DIAGNOSIS — R002 Palpitations: Secondary | ICD-10-CM

## 2011-05-02 DIAGNOSIS — E785 Hyperlipidemia, unspecified: Secondary | ICD-10-CM

## 2011-05-02 DIAGNOSIS — R079 Chest pain, unspecified: Secondary | ICD-10-CM

## 2011-05-02 DIAGNOSIS — E782 Mixed hyperlipidemia: Secondary | ICD-10-CM

## 2011-05-02 LAB — AST: AST: 15 U/L (ref 0–37)

## 2011-05-02 NOTE — Assessment & Plan Note (Signed)
Denies

## 2011-05-02 NOTE — Progress Notes (Signed)
HPI Patient is an 77 year old with a history of aortic sclerosis, CV disease, dyslipidemia and hypertension I saw her last year. Since seen she has done ok froma a cardiac standpoint.  No chest pains.   Her breathing is occas short but not predictably She does get SOB with activity (chronic).  Note myoview last fall showed no inducible ischemia.   Since seen she denies chest pains.  No signif SOB> Notes occasional dizziness.  Infrequent.  No syncope.  No Known Allergies  Current Outpatient Prescriptions  Medication Sig Dispense Refill  . carvedilol (COREG) 3.125 MG tablet TAKE 1 TABLET TWICE A DAY  180 tablet  2  . levothyroxine (SYNTHROID, LEVOTHROID) 75 MCG tablet Take 75 mcg by mouth daily.        . quinapril (ACCUPRIL) 40 MG tablet Take 40 mg by mouth daily.        . simvastatin (ZOCOR) 20 MG tablet TAKE 1 TABLET DAILY AT BEDTIME  90 tablet  2  . aspirin 81 MG tablet Take 81 mg by mouth daily.        . nitroGLYCERIN (NITROSTAT) 0.4 MG SL tablet Place 0.4 mg under the tongue every 5 (five) minutes as needed.          Past Medical History  Diagnosis Date  . Dyslipidemia   . Hypertension   . Palpitations     PVCs/bigeminy on event  . Chest pain   . Cardiovascular disease     moderate by USN 01/2010    Past Surgical History  Procedure Date  . Cholecystectomy   . Cesarean section     times 2    Family History  Problem Relation Age of Onset  . Coronary artery disease Father     History   Social History  . Marital Status: Married    Spouse Name: N/A    Number of Children: N/A  . Years of Education: N/A   Occupational History  . Not on file.   Social History Main Topics  . Smoking status: Never Smoker   . Smokeless tobacco: Not on file  . Alcohol Use: No  . Drug Use:   . Sexually Active:    Other Topics Concern  . Not on file   Social History Narrative  . No narrative on file    Review of Systems:  All systems reviewed.  They are negative to the above  problem except as previously stated.  Vital Signs: BP 122/68  Pulse 64  Ht 4\' 11"  (1.499 m)  Wt 163 lb (73.936 kg)  BMI 32.92 kg/m2  Physical Exam Patient is in NAD HEENT:  Normocephalic, atraumatic. EOMI, PERRLA.  Neck: JVP is normal. No thyromegaly. bruits.  Lungs: clear to auscultation. No rales no wheezes.  Heart: Regular rate and rhythm. Normal S1, S2. No S3.   Gr II/Vi systolic murmur. PMI not displaced.  Abdomen:  Supple, nontender. Normal bowel sounds. No masses. No hepatomegaly.  Extremities:   Good distal pulses throughout. No lower extremity edema.  Musculoskeletal :moving all extremities.  Neuro:   alert and oriented x3.  CN II-XII grossly intact. EKG:  NSR.  61 bpm.  LVH.  First degree av block.  PVC.  Assessment and Plan:

## 2011-05-02 NOTE — Assessment & Plan Note (Signed)
Good control

## 2011-05-02 NOTE — Patient Instructions (Signed)
Lab work today. We will call you with results.  Your physician wants you to follow-up in Feb 2013 with Dr.Ross You will receive a reminder letter in the mail two months in advance. If you don't receive a letter, please call our office to schedule the follow-up appointment.

## 2011-05-02 NOTE — Assessment & Plan Note (Signed)
Patient did eat a cookie about 2 hrs ago.  Sitill I would get lipid panel. And AST.

## 2011-09-12 ENCOUNTER — Other Ambulatory Visit: Payer: Self-pay | Admitting: Internal Medicine

## 2011-09-15 ENCOUNTER — Observation Stay (HOSPITAL_COMMUNITY)
Admission: EM | Admit: 2011-09-15 | Discharge: 2011-09-16 | Disposition: A | Discharge status: Home / Self Care | Payer: Medicare Other | Attending: Family Medicine | Admitting: Family Medicine

## 2011-09-15 ENCOUNTER — Telehealth: Payer: Self-pay | Admitting: Physician Assistant

## 2011-09-15 ENCOUNTER — Emergency Department (HOSPITAL_COMMUNITY): Payer: Medicare Other

## 2011-09-15 DIAGNOSIS — M1990 Unspecified osteoarthritis, unspecified site: Secondary | ICD-10-CM | POA: Insufficient documentation

## 2011-09-15 DIAGNOSIS — R079 Chest pain, unspecified: Principal | ICD-10-CM | POA: Insufficient documentation

## 2011-09-15 DIAGNOSIS — E785 Hyperlipidemia, unspecified: Secondary | ICD-10-CM | POA: Insufficient documentation

## 2011-09-15 DIAGNOSIS — R002 Palpitations: Secondary | ICD-10-CM | POA: Insufficient documentation

## 2011-09-15 DIAGNOSIS — E039 Hypothyroidism, unspecified: Secondary | ICD-10-CM | POA: Insufficient documentation

## 2011-09-15 DIAGNOSIS — I10 Essential (primary) hypertension: Secondary | ICD-10-CM | POA: Insufficient documentation

## 2011-09-15 LAB — CBC
HCT: 37.5 % (ref 36.0–46.0)
MCHC: 33.3 g/dL (ref 30.0–36.0)
MCV: 91.2 fL (ref 78.0–100.0)
Platelets: 141 10*3/uL — ABNORMAL LOW (ref 150–400)
RDW: 13.3 % (ref 11.5–15.5)
WBC: 5.3 10*3/uL (ref 4.0–10.5)

## 2011-09-15 LAB — DIFFERENTIAL
Basophils Absolute: 0.1 10*3/uL (ref 0.0–0.1)
Eosinophils Absolute: 0.2 10*3/uL (ref 0.0–0.7)
Eosinophils Relative: 3 % (ref 0–5)
Lymphocytes Relative: 33 % (ref 12–46)
Monocytes Absolute: 0.4 10*3/uL (ref 0.1–1.0)

## 2011-09-15 LAB — BASIC METABOLIC PANEL
BUN: 23 mg/dL (ref 6–23)
Creatinine, Ser: 1.24 mg/dL — ABNORMAL HIGH (ref 0.50–1.10)
GFR calc Af Amer: 45 mL/min — ABNORMAL LOW (ref >90)
GFR calc non Af Amer: 39 mL/min — ABNORMAL LOW (ref >90)
Potassium: 3.8 mEq/L (ref 3.5–5.1)

## 2011-09-15 LAB — POCT I-STAT TROPONIN I: Troponin i, poc: 0.03 ng/mL (ref 0.00–0.08)

## 2011-09-15 LAB — TROPONIN I: Troponin I: <0.3 ng/mL (ref <0.30)

## 2011-09-15 LAB — CK TOTAL AND CKMB (NOT AT ARMC): Total CK: 76 U/L (ref 7–177)

## 2011-09-15 NOTE — Telephone Encounter (Signed)
Pt's granddaughter Shanda Bumps called to say that her grandmother is having chest palpitations and they want to bring her to the hospital but weren't sure if they should go to [place] or [place]. I advised they proceed to [facility] given 24-hr cardiology coverage. She expressed understanding and will proceed to [facility].

## 2011-09-16 ENCOUNTER — Telehealth: Payer: Self-pay | Admitting: Internal Medicine

## 2011-09-16 LAB — COMPREHENSIVE METABOLIC PANEL
ALT: 10 U/L (ref 0–35)
Albumin: 3.1 g/dL — ABNORMAL LOW (ref 3.5–5.2)
Alkaline Phosphatase: 60 U/L (ref 39–117)
BUN: 22 mg/dL (ref 6–23)
Chloride: 107 mEq/L (ref 96–112)
Potassium: 3.7 mEq/L (ref 3.5–5.1)
Total Bilirubin: 0.5 mg/dL (ref 0.3–1.2)

## 2011-09-16 LAB — CBC
HCT: 36 % (ref 36.0–46.0)
Hemoglobin: 11.6 g/dL — ABNORMAL LOW (ref 12.0–15.0)
RDW: 13.4 % (ref 11.5–15.5)
WBC: 4.7 10*3/uL (ref 4.0–10.5)

## 2011-09-16 LAB — CARDIAC PANEL(CRET KIN+CKTOT+MB+TROPI)
CK, MB: 3 ng/mL (ref 0.3–4.0)
Relative Index: INVALID (ref 0.0–2.5)
Total CK: 54 U/L (ref 7–177)
Total CK: 54 U/L (ref 7–177)
Troponin I: <0.3 ng/mL (ref <0.30)

## 2011-09-16 NOTE — Discharge Summary (Signed)
[name], HEENAN           ACCOUNT NO.:  [id number]  MEDICAL RECORD NO.:  [id number]  LOCATION:  2031                         FACILITY:  [facility]  PHYSICIAN:  [name], MD        DATE OF BIRTH:  1925-07-04  DATE OF ADMISSION:  09/15/2011 DATE OF DISCHARGE:                              DISCHARGE SUMMARY   DISCHARGE DIAGNOSES: 1. Chest pain - possibly unstable angina, relieved by nitroglycerin -     ruled out by 3 sets of cardiac enzymes.     a.     Hypertension controlled.     b.     History of hyperlipidemia.     c.     History of hypothyroidism.     d.     Degenerative joint disease.     e.     Palpitations with the event monitor performed in May 2009      revealing this was relatively asymptomatic.  DISCHARGE MEDICATIONS: 1. Quinapril 40 mg 1 tab daily. 2. Carvedilol 3.125 one tab b.i.d. 3. Zocor 20 at bedtime 1 tab. 4. Levothyroxine 75 mcg.  NEW MEDICATIONS:  Aspirin 81 mg daily and nitroglycerin sublingual 0.4 mg q.5 minutes p.r.n.  IMAGING STUDIES:  Chest x-ray two-view September 15, 2011, showed cardiac enlargement, which may be increased from prior, no tortuous aorta, thoracic spondylosis, and leftward scoliosis, previous surgery, cholecystectomy.  Please see full admission H and P by Dr. Orvan Falconer done just yesterday, but briefly this is an 77 year old lady with bigeminy and PVCs, treated conservatively by Dr. [name] who presented with 10-15 minutes of chest pain while she was sitting down on her granddaughter's bed after a very active and busy day.  She took sublingual nitroglycerin, but then had some palpitations, which is somewhat common to her.  She states that the pain was 8 on 10 and was worst beyond 10 after getting nitroglycerin.  She was seen by hospitalist and Dr. [name] was consulted regarding her care.  It was noted that the patient ruled out with 3 sets of cardiac enzymes and was seen on day of discharge and was doing well, had no chest pain  whatsoever.  I did speak with Dr. [name] who happened to be on-call today and mentioned the case to her she states that the patient actually had a Myoview stress test done in 2011 that was absolutely normal and that the patient can follow up in close interim with her as an outpatient.  Dr. [name] would arranged followup for the same.  The rest of her chronic medical conditions were stable while in the hospital, and although she had PVCs and some bigeminy on monitors, it was felt that conservative approach at this point would be appropriate per Dr. [name].  The patient seen on day of discharge, was doing well.  Has no nausea, chest pain, headache, vomiting.  Temperature was 97.8, pulse was 58, respirations were 18, blood pressures ranged between the 130s to 150s over 61 to 68, and O2 sats 95% on room air.  DISCHARGE EXAM:  Mild arcus senilis.  Good dentition.  S1 and S2. Murmur at left and right upper sternal edge, 4/6 crescendo-decrescendo. Mildly displaced PMI.  Chest clinically clear.  Abdomen is soft, nontender.  No rebound.  No lower extremity edema.  Neurologically grossly intact.  The patient understands to come back to the emergency room or follow up with Dr. [name] if she has any further issues, and the patient was discharged home in stable state.          ______________________________ [name], MD     JS/MEDQ  D:  09/16/2011  T:  09/16/2011  Job:  [id number]  Electronically Signed by [name] MD on 09/16/2011 09:12:20 PM

## 2011-09-16 NOTE — Telephone Encounter (Signed)
SPoke to hospitalist   Patient admtted for CP  R/O  Had freq PVCs  Feeling good D/C today.  Will schedule f/u with me or scott in the November.

## 2011-09-17 NOTE — H&P (Signed)
NAMELINNA, [name] NO.:  [id number]  MEDICAL RECORD NO.:  [id number]  LOCATION:  2031                         FACILITY:  [facility]  PHYSICIAN:  Vania Rea, M.D. DATE OF BIRTH:  [birth year]  DATE OF ADMISSION:  09/15/2011 DATE OF DISCHARGE:                             HISTORY & PHYSICAL   PRIMARY CARDIOLOGIST:  Pricilla Riffle, MD, [facility]  CHIEF COMPLAINT:  Chest pain this evening.  HISTORY OF PRESENT ILLNESS:  This is an 77 year old Caucasian lady with a history of recurrent chest pain associated with palpations shown on testing to be bigeminy and PVCs and followed and treated conservatively by Dr. [name].  Has no history of coronary artery disease.  The patient this afternoon had sudden onset of a central pressing chest pain, which lasted about 35 minutes and was eventually relieved by taking 1 sublingual nitroglycerin.  The patient said the sublingual nitroglycerin gave prompt relief of the pain, but then precipitated palpitations and the usual type of chest pain that she has with her palpitations.  The patient says this chest pain that she had this evening was not her typical chest pain, and she therefore decided to come to the emergency room for evaluation.  The pain was described as 8/10 at its worst, and was 0/10 after receiving nitroglycerin.  It was associated with some dizziness and lightheadedness, but there was no diaphoresis.  She does not have lower extremity edema.  She denies any fever, cough, or cold.  She denies any frequency or dysuria.  PAST MEDICAL HISTORY:  Hypertension, hyperlipidemia, hypothyroidism, palpitations.  MEDICATIONS:  Include: 1. Aspirin 81 mg daily. 2. Synthroid 75 mcg daily. 3. Quinapril 40 mg daily. 4. Simvastatin 20 mg at bedtime. 5. Carvedilol 3.125 mg twice daily. 6. Sublingual nitroglycerin p.r.n.  ALLERGIES:  No known drug allergies.  SOCIAL HISTORY:  Denies tobacco, alcohol, or illicit drug use. 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  FAMILY HISTORY:  Positive for father who had coronary artery disease in his 17s and a brother with lung cancer.  REVIEW OF SYSTEMS:  Other than noted above, a complete review of systems was unremarkable.  PHYSICAL EXAMINATION:  GENERAL:  A pleasant, mildly obese, elderly Caucasian lady reclining in the stretcher, currently in no acute distress. VITAL SIGNS:  Temperature is 97.9, her pulse is 62 and regular, respiration 15, blood pressure 119/54.  She is saturating at 99% on room air. HEENT:  Pupils are round and equal.  Mucous membranes pink.  Anicteric. She has no cervical lymphadenopathy.  No thyromegaly or carotid bruits. CHEST:  Clear to auscultation bilaterally. CARDIOVASCULAR:  Regularly irregular rhythm. ABDOMEN:  Obese, soft, nontender.  No masses. EXTREMITIES:  Without edema.  She has arthritic deformities of the knees and hands.  CENTRAL NERVOUS SYSTEM:  Cranial nerves 2-12 are grossly intact.  She has no focal lateralizing signs.  LABORATORY DATA:  CBC is reviewed and is unremarkable.  Her white count is 5.3, hemoglobin 12.5, platelets 241.  Her sodium is 140, potassium 3.8, chloride 105, CO2 27, glucose 148, BUN 23, creatinine 1.2.  Her troponin is undetectable.  Her total CK is 76, CK-MB is 3.7.  Her EKG shows bigeminy rhythm with no acute ST-segment changes.  ASSESSMENT: 1. Unstable angina as evidenced by chest pain, relieved by     nitroglycerin. 2. Hypertension, controlled. 3. History of hyperlipidemia. 4. Hypothyroidism. 5. We will bring this lady on observation on a cardiac rule out.  She     has been discussed with Dr. [name] from [facility]     and he agrees with admitting her overnight to rule out an MI by     cardiac enzymes but feels that if her enzymes are negative, the     patient can be discharged home to follow up  with Dr. [name] as     an outpatient for outpatient stress testing.  We will follow this. 6.   Other plans as per orders.     Vania Rea, M.D.     LC/MEDQ  D:  09/16/2011  T:  09/16/2011  Job:  [id number]  Electronically Signed by Vania Rea M.D. on 09/17/2011 11:33:01 AM

## 2011-09-30 ENCOUNTER — Encounter: Payer: Self-pay | Admitting: *Deleted

## 2011-10-03 ENCOUNTER — Encounter: Payer: Self-pay | Admitting: Physician Assistant

## 2011-10-03 ENCOUNTER — Ambulatory Visit (INDEPENDENT_AMBULATORY_CARE_PROVIDER_SITE_OTHER): Payer: Medicare Other | Admitting: Physician Assistant

## 2011-10-03 DIAGNOSIS — E785 Hyperlipidemia, unspecified: Secondary | ICD-10-CM

## 2011-10-03 DIAGNOSIS — R079 Chest pain, unspecified: Secondary | ICD-10-CM

## 2011-10-03 DIAGNOSIS — I493 Ventricular premature depolarization: Secondary | ICD-10-CM | POA: Insufficient documentation

## 2011-10-03 DIAGNOSIS — I4949 Other premature depolarization: Secondary | ICD-10-CM

## 2011-10-03 DIAGNOSIS — I10 Essential (primary) hypertension: Secondary | ICD-10-CM

## 2011-10-03 DIAGNOSIS — I6529 Occlusion and stenosis of unspecified carotid artery: Secondary | ICD-10-CM

## 2011-10-03 LAB — LIPID PANEL
Cholesterol: 172 mg/dL (ref 0–200)
HDL: 95.9 mg/dL (ref >39.00)
LDL Cholesterol: 64 mg/dL (ref 0–99)
Triglycerides: 63 mg/dL (ref 0.0–149.0)
VLDL: 12.6 mg/dL (ref 0.0–40.0)

## 2011-10-03 LAB — HEPATIC FUNCTION PANEL: Total Bilirubin: 0.5 mg/dL (ref 0.3–1.2)

## 2011-10-03 NOTE — Assessment & Plan Note (Signed)
No recurrence.  Thinks it may have been gas.  She had a normal myoview in 2011.  She is fairly active without exertional symptoms.  No further workup.  She will contact us if she has more pain.

## 2011-10-03 NOTE — Progress Notes (Signed)
History of Present Illness: PCP:  Dr. Paulino Rily at [place] [facility] [place] [facility] Cardiologist:  Dr. [name]   [name] is a 77 y.o. female with a hitory of aortic sclerosis, carotid stenosis, HLP and HTN.  She presents for post hospital follow up.  She was last seen by Dr. [name] 05/02/11.  Last echo 1/10: EF 65%, mild LVH, mild to mod thickened aortic valve, LA size upper limits of normal, mild RAE.  Carotids 3/08: 0-39% LICA and 40-59% RICA (follow up in 3/13).  Myoview 10/11: no ischemia.  Admitted 09/15/11-09/16/11 with chest pain and palpitations.  MI was ruled out.  She was on hospitalist's service and they spoke with Dr. [name] by phone.  It was recommended she follow up as an outpatient.    Labs: Hgb 11.6, Plt 127, K 3.7, Creatinine 0.98, ALT 10;  CXR: IMPRESSION: Cardiomegaly, no active infiltrates.    No complaints today except her LBP.  The patient denies chest pain, shortness of breath, syncope, orthopnea, PND or significant pedal edema.  She is a fairly active octogenarian.  She denies exertional dyspnea or chest pain.  She denies palpitations.    Past Medical History  Diagnosis Date  . Dyslipidemia   . Hypertension   . Palpitations     PVCs/bigeminy on event in May 2009 revealing this was relatively asymptomatic  . Chest pain   . Cardiovascular disease     moderate by USN 01/2010  . Degenerative joint disease     Current Outpatient Prescriptions  Medication Sig Dispense Refill  . carvedilol (COREG) 3.125 MG tablet Take 3.125 mg by mouth 2 (two) times daily with a meal.        . levothyroxine (SYNTHROID, LEVOTHROID) 75 MCG tablet TAKE 1 TABLET DAILY  90 tablet  1  . nitroGLYCERIN (NITROSTAT) 0.4 MG SL tablet Place 0.4 mg under the tongue every 5 (five) minutes as needed. For chest pain      . quinapril (ACCUPRIL) 40 MG tablet TAKE 1 TABLET DAILY  90 tablet  1  . simvastatin (ZOCOR) 20 MG tablet Take 20 mg by mouth at bedtime.          Allergies: No Known  Allergies  History  Substance Use Topics  . Smoking status: Never Smoker   . Smokeless tobacco: Not on file  . Alcohol Use: No     Vital Signs: BP 100/70  Pulse 78  Ht 4\' 11"  (1.499 m)  Wt 155 lb (70.308 kg)  BMI 31.31 kg/m2  PHYSICAL EXAM: Well nourished, well developed, in no acute distress HEENT: normal Neck: no JVD Cardiac:  normal S1, S2; RRR; no murmur Lungs:  clear to auscultation bilaterally, no wheezing, rhonchi or rales Abd: soft, nontender, no hepatomegaly Ext: no edema Skin: warm and dry Neuro:  CNs 2-12 intact, no focal abnormalities noted  EKG:   NSR, HR 78, 1st degree AVB, PVC's in bigeminal pattern, no change from prior.  ASSESSMENT AND PLAN:

## 2011-10-03 NOTE — Assessment & Plan Note (Signed)
Controlled.  Continue current therapy.  

## 2011-10-03 NOTE — Assessment & Plan Note (Signed)
Asymptomatic.  She had a monitor in 2009 that demonstrated bigeminy. No further workup.

## 2011-10-03 NOTE — Assessment & Plan Note (Addendum)
She is fasting today and will get FLP and LFTs.

## 2011-10-03 NOTE — Assessment & Plan Note (Signed)
Dopplers due in 01/2012.  These can be scheduled at her follow up with Dr. [name] in 12/2011.

## 2011-10-03 NOTE — Patient Instructions (Addendum)
Your physician recommends that you schedule a follow-up appointment in: FOLLOW UP WITH DR. ROSS 12/19/11 @ 9:15   NO CHANGES @ THIS TIME  Your physician recommends that you return for lab work in: FASTING LIVER/LIPID PANEL 272.4 TODAY

## 2011-11-28 ENCOUNTER — Other Ambulatory Visit: Payer: Self-pay

## 2011-11-28 MED ORDER — SIMVASTATIN 20 MG PO TABS: 20.0000 mg | ORAL_TABLET | Freq: Every day | ORAL | Status: DC

## 2011-11-28 MED ORDER — CARVEDILOL 3.125 MG PO TABS: 3.1250 mg | ORAL_TABLET | Freq: Two times a day (BID) | ORAL | Status: DC

## 2011-11-30 ENCOUNTER — Telehealth: Payer: Self-pay | Admitting: Internal Medicine

## 2011-11-30 MED ORDER — QUINAPRIL HCL 40 MG PO TABS: 40.0000 mg | ORAL_TABLET | Freq: Every day | ORAL | Status: DC

## 2011-11-30 MED ORDER — SIMVASTATIN 20 MG PO TABS: 20.0000 mg | ORAL_TABLET | Freq: Every day | ORAL | Status: DC

## 2011-11-30 MED ORDER — CARVEDILOL 3.125 MG PO TABS: 3.1250 mg | ORAL_TABLET | Freq: Two times a day (BID) | ORAL | Status: DC

## 2011-11-30 MED ORDER — LEVOTHYROXINE SODIUM 75 MCG PO TABS: 75.0000 ug | ORAL_TABLET | Freq: Every day | ORAL | Status: DC

## 2011-11-30 NOTE — Telephone Encounter (Signed)
Pt calling re refill for carvedilol 3.125 mg, levothyroxine 75 mcg, quinapril 40mg , and simvastatin 20 mg, pt [facility]

## 2011-11-30 NOTE — Telephone Encounter (Signed)
Prescription send to [facility] as requested, patient aware.

## 2011-12-05 ENCOUNTER — Other Ambulatory Visit: Payer: Self-pay

## 2011-12-05 MED ORDER — CARVEDILOL 3.125 MG PO TABS: 3.1250 mg | ORAL_TABLET | Freq: Two times a day (BID) | ORAL | Status: DC

## 2011-12-05 MED ORDER — SIMVASTATIN 20 MG PO TABS: 20.0000 mg | ORAL_TABLET | Freq: Every day | ORAL | Status: DC

## 2011-12-05 MED ORDER — LEVOTHYROXINE SODIUM 75 MCG PO TABS: 75.0000 ug | ORAL_TABLET | Freq: Every day | ORAL | Status: DC

## 2011-12-05 NOTE — Telephone Encounter (Signed)
....   Requested Prescriptions   Signed Prescriptions Disp Refills  . carvedilol (COREG) 3.125 MG tablet 90 tablet 11    Sig: Take 1 tablet (3.125 mg total) by mouth 2 (two) times daily with a meal.    Authorizing Provider: Pricilla Riffle    Ordering User: Callan Yontz M  . simvastatin (ZOCOR) 20 MG tablet 90 tablet 11    Sig: Take 1 tablet (20 mg total) by mouth at bedtime.    Authorizing Provider: Pricilla Riffle    Ordering User: Macarena Langseth M  . levothyroxine (SYNTHROID, LEVOTHROID) 75 MCG tablet 90 tablet 11    Sig: Take 1 tablet (75 mcg total) by mouth daily.    Authorizing Provider: Pricilla Riffle    Ordering User: Christella Hartigan, Ruth Kovich Judie Petit

## 2011-12-19 ENCOUNTER — Ambulatory Visit (INDEPENDENT_AMBULATORY_CARE_PROVIDER_SITE_OTHER): Payer: Medicare Other | Admitting: Internal Medicine

## 2011-12-19 ENCOUNTER — Encounter: Payer: Self-pay | Admitting: Internal Medicine

## 2011-12-19 DIAGNOSIS — I10 Essential (primary) hypertension: Secondary | ICD-10-CM

## 2011-12-19 DIAGNOSIS — R109 Unspecified abdominal pain: Secondary | ICD-10-CM

## 2011-12-19 DIAGNOSIS — I6529 Occlusion and stenosis of unspecified carotid artery: Secondary | ICD-10-CM

## 2011-12-19 DIAGNOSIS — I493 Ventricular premature depolarization: Secondary | ICD-10-CM

## 2011-12-19 DIAGNOSIS — R079 Chest pain, unspecified: Secondary | ICD-10-CM

## 2011-12-19 DIAGNOSIS — E785 Hyperlipidemia, unspecified: Secondary | ICD-10-CM

## 2011-12-19 DIAGNOSIS — I4949 Other premature depolarization: Secondary | ICD-10-CM

## 2011-12-19 LAB — CBC WITH DIFFERENTIAL/PLATELET
Basophils Absolute: 0.1 10*3/uL (ref 0.0–0.1)
Eosinophils Absolute: 0 10*3/uL (ref 0.0–0.7)
Lymphocytes Relative: 11.2 % — ABNORMAL LOW (ref 12.0–46.0)
MCHC: 33.8 g/dL (ref 30.0–36.0)
MCV: 90.6 fl (ref 78.0–100.0)
Monocytes Absolute: 0.8 10*3/uL (ref 0.1–1.0)
Neutrophils Relative %: 80.3 % — ABNORMAL HIGH (ref 43.0–77.0)
RDW: 13.6 % (ref 11.5–14.6)

## 2011-12-19 LAB — URINALYSIS
Bilirubin Urine: NEGATIVE
Leukocytes, UA: NEGATIVE
Nitrite: NEGATIVE
Specific Gravity, Urine: 1.01 (ref 1.000–1.030)
Urobilinogen, UA: 0.2 (ref 0.0–1.0)
pH: 6 (ref 5.0–8.0)

## 2011-12-19 LAB — BASIC METABOLIC PANEL
Chloride: 104 mEq/L (ref 96–112)
Creatinine, Ser: 0.9 mg/dL (ref 0.4–1.2)
GFR: 62.48 mL/min (ref >60.00)
Potassium: 4.2 mEq/L (ref 3.5–5.1)

## 2011-12-19 LAB — TSH: TSH: 2.11 u[IU]/mL (ref 0.35–5.50)

## 2011-12-19 NOTE — Assessment & Plan Note (Signed)
Will check CBC, BMET, TSH and UA.  If recurs and labs unremarkable should call primary.

## 2011-12-19 NOTE — Assessment & Plan Note (Signed)
Relatively asymptomatic.

## 2011-12-19 NOTE — Progress Notes (Signed)
HPI Patient is a 78 year old with a history of aortic sclerosis, CV stenosis, HTN, dyslpidemia.  She was last seen in clinic by [name] as a post hosp f/u  [facility] in November with CP and palpitations.  R/O MI.  Note that she had a normal myoview in 2011. Yesterday at 5 PM developed lower abdomenal pain.  Came and went.   Breathing is OK.  Occasional  Feels chest skip.  No CP. No Known Allergies  Current Outpatient Prescriptions  Medication Sig Dispense Refill  . aspirin 81 MG tablet Take 81 mg by mouth daily.      . carvedilol (COREG) 3.125 MG tablet Take 1 tablet (3.125 mg total) by mouth 2 (two) times daily with a meal.  90 tablet  11  . levothyroxine (SYNTHROID, LEVOTHROID) 75 MCG tablet Take 1 tablet (75 mcg total) by mouth daily.  90 tablet  11  . nitroGLYCERIN (NITROSTAT) 0.4 MG SL tablet Place 0.4 mg under the tongue every 5 (five) minutes as needed. For chest pain      . quinapril (ACCUPRIL) 40 MG tablet Take 1 tablet (40 mg total) by mouth daily.  90 tablet  3  . simvastatin (ZOCOR) 20 MG tablet Take 1 tablet (20 mg total) by mouth at bedtime.  90 tablet  11    Past Medical History  Diagnosis Date  . Dyslipidemia   . Hypertension   . Palpitations     PVCs/bigeminy on event in May 2009 revealing this was relatively asymptomatic  . Chest pain   . Cardiovascular disease     moderate by USN 01/2010  . Degenerative joint disease     Past Surgical History  Procedure Date  . Cholecystectomy   . Cesarean section     times 2    Family History  Problem Relation Age of Onset  . Coronary artery disease Father     History   Social History  . Marital Status: Married    Spouse Name: N/A    Number of Children: N/A  . Years of Education: N/A   Occupational History  . Not on file.   Social History Main Topics  . Smoking status: Never Smoker   . Smokeless tobacco: Not on file  . Alcohol Use: No  . Drug Use:   . Sexually Active:    Other Topics Concern  . Not on file    Social History Narrative  . No narrative on file    Review of Systems:  All systems reviewed.  They are negative to the above problem except as previously stated.  Vital Signs: BP 110/60  Pulse 80  Ht 4\' 11"  (1.499 m)  Wt 158 lb (71.668 kg)  BMI 31.91 kg/m2  Physical Exam  Patient is in NAD HEENT:  Normocephalic, atraumatic. EOMI, PERRLA.  Neck: JVP is normal. Lungs: clear to auscultation. No rales no wheezes.  Heart: Regular rate and rhythm. Normal S1, S2. No S3.   No significant murmurs. PMI not displaced.  Abdomen:  Normal BS.  Mild Lower abdomenal discomfort.  No rebound. Normal bowel sounds. No masses. No hepatomegaly.  Extremities:   Good distal pulses throughout. No lower extremity edema.  Musculoskeletal :moving all extremities.  Neuro:   alert and oriented x3.  CN II-XII grossly intact.   Assessment and Plan:

## 2011-12-19 NOTE — Assessment & Plan Note (Signed)
Good control

## 2011-12-19 NOTE — Patient Instructions (Signed)
Lab work today. We will call you with results.   Your physician wants you to follow-up in: August 2013 You will receive a reminder letter in the mail two months in advance. If you don't receive a letter, please call our office to schedule the follow-up appointment.

## 2011-12-19 NOTE — Assessment & Plan Note (Signed)
Excellent control.   

## 2011-12-19 NOTE — Assessment & Plan Note (Signed)
Denies.  Follow.

## 2012-01-23 ENCOUNTER — Other Ambulatory Visit: Payer: Self-pay

## 2012-01-23 MED ORDER — QUINAPRIL HCL 40 MG PO TABS: 40.0000 mg | ORAL_TABLET | Freq: Every day | ORAL | Status: DC

## 2012-01-29 ENCOUNTER — Emergency Department (HOSPITAL_COMMUNITY): Payer: Medicare Other

## 2012-01-29 ENCOUNTER — Encounter (HOSPITAL_COMMUNITY): Payer: Self-pay | Admitting: Emergency Medicine

## 2012-01-29 ENCOUNTER — Emergency Department (HOSPITAL_COMMUNITY)
Admission: EM | Admit: 2012-01-29 | Discharge: 2012-01-29 | Disposition: A | Discharge status: Home / Self Care | Payer: Medicare Other | Attending: Emergency Medicine | Admitting: Emergency Medicine

## 2012-01-29 DIAGNOSIS — R110 Nausea: Secondary | ICD-10-CM | POA: Insufficient documentation

## 2012-01-29 DIAGNOSIS — Z79899 Other long term (current) drug therapy: Secondary | ICD-10-CM | POA: Insufficient documentation

## 2012-01-29 DIAGNOSIS — R109 Unspecified abdominal pain: Secondary | ICD-10-CM

## 2012-01-29 DIAGNOSIS — K5730 Diverticulosis of large intestine without perforation or abscess without bleeding: Secondary | ICD-10-CM | POA: Insufficient documentation

## 2012-01-29 DIAGNOSIS — I10 Essential (primary) hypertension: Secondary | ICD-10-CM | POA: Insufficient documentation

## 2012-01-29 LAB — COMPREHENSIVE METABOLIC PANEL
ALT: 12 U/L (ref 0–35)
Alkaline Phosphatase: 82 U/L (ref 39–117)
BUN: 20 mg/dL (ref 6–23)
CO2: 25 mEq/L (ref 19–32)
Chloride: 105 mEq/L (ref 96–112)
GFR calc Af Amer: 60 mL/min — ABNORMAL LOW (ref >90)
GFR calc non Af Amer: 52 mL/min — ABNORMAL LOW (ref >90)
Glucose, Bld: 106 mg/dL — ABNORMAL HIGH (ref 70–99)
Potassium: 4.2 mEq/L (ref 3.5–5.1)
Sodium: 137 mEq/L (ref 135–145)
Total Bilirubin: 0.4 mg/dL (ref 0.3–1.2)
Total Protein: 6.6 g/dL (ref 6.0–8.3)

## 2012-01-29 LAB — DIFFERENTIAL
Lymphocytes Relative: 30 % (ref 12–46)
Lymphs Abs: 1.6 10*3/uL (ref 0.7–4.0)
Monocytes Relative: 9 % (ref 3–12)
Neutro Abs: 2.8 10*3/uL (ref 1.7–7.7)
Neutrophils Relative %: 56 % (ref 43–77)

## 2012-01-29 LAB — URINALYSIS, ROUTINE W REFLEX MICROSCOPIC
Bilirubin Urine: NEGATIVE
Ketones, ur: NEGATIVE mg/dL
Nitrite: NEGATIVE
Protein, ur: NEGATIVE mg/dL
pH: 6.5 (ref 5.0–8.0)

## 2012-01-29 LAB — CBC
Hemoglobin: 13.2 g/dL (ref 12.0–15.0)
Platelets: 154 10*3/uL (ref 150–400)
RBC: 4.45 MIL/uL (ref 3.87–5.11)
WBC: 5.1 10*3/uL (ref 4.0–10.5)

## 2012-01-29 MED ORDER — HYDROCODONE-ACETAMINOPHEN 5-500 MG PO TABS: 1.0000 | ORAL_TABLET | Freq: Four times a day (QID) | ORAL | Status: DC | PRN

## 2012-01-29 MED ORDER — MORPHINE SULFATE 4 MG/ML IJ SOLN
4.0000 mg | Freq: Once | INTRAMUSCULAR | Status: AC
  Administered 2012-01-29: 4 mg via INTRAVENOUS
  Filled 2012-01-29: qty 1

## 2012-01-29 MED ORDER — SODIUM CHLORIDE 0.9 % IV BOLUS (SEPSIS)
1000.0000 mL | Freq: Once | INTRAVENOUS | Status: AC
  Administered 2012-01-29: 1000 mL via INTRAVENOUS

## 2012-01-29 MED ORDER — ONDANSETRON HCL 4 MG/2ML IJ SOLN
4.0000 mg | Freq: Once | INTRAMUSCULAR | Status: AC
  Administered 2012-01-29: 4 mg via INTRAVENOUS
  Filled 2012-01-29: qty 2

## 2012-01-29 NOTE — Discharge Instructions (Signed)

## 2012-01-29 NOTE — ED Notes (Signed)
Patient is alert and oriented x3.  She was given DC instructions and follow up visit instructions.  Patient gave verbal understanding. She was DC via wheelchair to home with family.  V/S stable.  SHe was not showing any signs of distress on DC 

## 2012-01-29 NOTE — ED Provider Notes (Signed)
History     CSN: [id number]  Arrival date & time 01/29/12  0549   First MD Initiated Contact with Patient 01/29/12 [phone]      Chief Complaint  Patient presents with  . Abdominal Pain    (Consider location/radiation/quality/duration/timing/severity/associated sxs/prior treatment) Patient is a 78 y.o. female presenting with abdominal pain. The history is provided by the patient.  Abdominal Pain The primary symptoms of the illness include abdominal pain and nausea. The primary symptoms of the illness do not include fever, shortness of breath, vomiting, diarrhea, dysuria or vaginal discharge. The current episode started yesterday. The onset of the illness was sudden. The problem has been gradually worsening.  The abdominal pain is located in the LLQ and left flank. The abdominal pain radiates to the LLQ. The severity of the abdominal pain is 9/10. The abdominal pain is relieved by nothing. Exacerbated by: pain seems to be worse while lying still.  Symptoms associated with the illness do not include chills, anorexia, diaphoresis, constipation, urgency or frequency. Significant associated medical issues do not include PUD or GERD.    Past Medical History  Diagnosis Date  . Dyslipidemia   . Hypertension   . Palpitations     PVCs/bigeminy on event in May 2009 revealing this was relatively asymptomatic  . Chest pain   . Cardiovascular disease     moderate by USN 01/2010  . Degenerative joint disease     Past Surgical History  Procedure Date  . Cholecystectomy   . Cesarean section     times 2    Family History  Problem Relation Age of Onset  . Coronary artery disease Father     History  Substance Use Topics  . Smoking status: Never Smoker   . Smokeless tobacco: Not on file  . Alcohol Use: No    OB History    Grav Para Term Preterm Abortions TAB SAB Ect Mult Living                  Review of Systems  Constitutional: Negative for fever, chills and diaphoresis.    Respiratory: Negative for shortness of breath.   Gastrointestinal: Positive for nausea and abdominal pain. Negative for vomiting, diarrhea, constipation and anorexia.  Genitourinary: Negative for dysuria, urgency, frequency and vaginal discharge.  All other systems reviewed and are negative.    Allergies  Review of patient's allergies indicates no known allergies.  Home Medications   Current Outpatient Rx  Name Route Sig Dispense Refill  . ASPIRIN 81 MG PO CHEW Oral Chew 81 mg by mouth daily.    Marland Kitchen CARVEDILOL 3.125 MG PO TABS Oral Take 1 tablet (3.125 mg total) by mouth 2 (two) times daily with a meal. 90 tablet 11  . LEVOTHYROXINE SODIUM 75 MCG PO TABS Oral Take 1 tablet (75 mcg total) by mouth daily. 90 tablet 11  . NITROGLYCERIN 0.4 MG SL SUBL Sublingual Place 0.4 mg under the tongue every 5 (five) minutes as needed. For chest pain    . QUINAPRIL HCL 40 MG PO TABS Oral Take 40 mg by mouth daily.    Marland Kitchen SIMVASTATIN 20 MG PO TABS Oral Take 1 tablet (20 mg total) by mouth at bedtime. 90 tablet 11    BP 180/89  Pulse 83  Temp 98.2 F (36.8 C)  Resp 18  Wt 172 lb (78.019 kg)  SpO2 97%  Physical Exam  Nursing note and vitals reviewed. Constitutional: She is oriented to person, place, and time. She appears well-developed  and well-nourished. She appears distressed.  HENT:  Head: Normocephalic and atraumatic.  Mouth/Throat: Oropharynx is clear and moist.  Eyes: EOM are normal. Pupils are equal, round, and reactive to light.  Cardiovascular: Normal rate, regular rhythm, normal heart sounds and intact distal pulses.  Exam reveals no friction rub.   No murmur heard. Pulmonary/Chest: Effort normal and breath sounds normal. She has no wheezes. She has no rales.  Abdominal: Soft. Bowel sounds are normal. She exhibits no distension. There is no tenderness. There is CVA tenderness. There is no rebound and no guarding.       Unable to produce pain in the left lower quadrant with palpation   Musculoskeletal: Normal range of motion. She exhibits no tenderness.       No edema  Neurological: She is alert and oriented to person, place, and time. No cranial nerve deficit.  Skin: Skin is warm and dry. No rash noted.  Psychiatric: She has a normal mood and affect. Her behavior is normal.    ED Course  Procedures (including critical care time)  Labs Reviewed  DIFFERENTIAL - Abnormal; Notable for the following:    Basophils Relative 2 (*)    All other components within normal limits  COMPREHENSIVE METABOLIC PANEL - Abnormal; Notable for the following:    Glucose, Bld 106 (*)    GFR calc non Af Amer 52 (*)    GFR calc Af Amer 60 (*)    All other components within normal limits  CBC  URINALYSIS, ROUTINE W REFLEX MICROSCOPIC   Ct Abdomen Pelvis Wo Contrast  01/29/2012  *RADIOLOGY REPORT*  Clinical Data: Right lower quadrant pain, left flank pain nausea  CT ABDOMEN AND PELVIS WITHOUT CONTRAST  Technique:  Multidetector CT imaging of the abdomen and pelvis was performed following the standard protocol without intravenous contrast.  Comparison: CT 06/01/2009  Findings: Lung bases are clear.  No pericardial fluid.  Non-IV contrast images demonstrate no focal hepatic lesion.  Prior cholecystectomy.  The pancreas, spleen, adrenal glands are normal. The right kidney is small.  The left kidney is small.  There is a large low density cyst extending from the left kidney. This may represent peripelvic cyst.  Small low density cyst extend from the upper pole right kidney.  There is no evidence of nephrolithiasis or ureterolithiasis.  The stomach, small bowel, and colon are normal. Diverticula the sigmoid colon without inflammation.  Abdominal aorta is normal caliber.  There is intimal calcification of the aorta.  No retroperitoneal lymphadenopathy.  In the pelvis, uterus and bladder are normal.  Ovaries are normal. No pelvic lymphadenopathy. Review of  bone windows demonstrates no aggressive osseous  lesions. Degenerative changes of the spine noted.  IMPRESSION:  1.  No acute abdominal or pelvic findings. 2.  Kidneys appear small. 3.  Bilateral simple appearing renal cysts. 4.  Sigmoid diverticulosis without diverticulitis.  Original Report Authenticated By: Genevive Bi, M.D.     1. Flank pain       MDM   Pt with symptoms consistent with kidney stone.  Denies infectious sx, or GI symptoms.  Low concern for diverticulitis as pt denies fever or diarrhea and not suggestive of AAA.  No hx suggestive of GU source (discharge) and unable to make the pain worse by palpation.  Will hydrate, treat pain and ensure no infection with UA, CBC, CMP and will get stone study to further eval.  7:25 AM All labs wnl.  CT showed left renal cyst that is unchanged. No  signs of AAA. Diverticulosis without diverticulitis. After one dose of pain medication and IV fluids patient's pain is resolved. Discussed films and labs extensively with patient unclear what is causing her pain. She was given strict return precautions and will followup with her doctor on Monday if the pain persists.       Gwyneth Sprout, MD 01/29/12 [phone]

## 2012-01-29 NOTE — ED Notes (Signed)
Pt reports LLQ pain, states that sx started first at the RLQ and today around 0100 am pain radiated the left. 10/10

## 2012-01-30 ENCOUNTER — Inpatient Hospital Stay (HOSPITAL_COMMUNITY)
Admission: EM | Admit: 2012-01-30 | Discharge: 2012-02-03 | DRG: 391 | Disposition: A | Discharge status: Home Health | Payer: Medicare Other | Attending: Family Medicine | Admitting: Family Medicine

## 2012-01-30 DIAGNOSIS — E785 Hyperlipidemia, unspecified: Secondary | ICD-10-CM | POA: Diagnosis present

## 2012-01-30 DIAGNOSIS — I10 Essential (primary) hypertension: Secondary | ICD-10-CM | POA: Diagnosis present

## 2012-01-30 DIAGNOSIS — K5900 Constipation, unspecified: Secondary | ICD-10-CM | POA: Diagnosis present

## 2012-01-30 DIAGNOSIS — M5126 Other intervertebral disc displacement, lumbar region: Secondary | ICD-10-CM | POA: Diagnosis present

## 2012-01-30 DIAGNOSIS — M5135 Other intervertebral disc degeneration, thoracolumbar region: Secondary | ICD-10-CM | POA: Diagnosis present

## 2012-01-30 DIAGNOSIS — M549 Dorsalgia, unspecified: Secondary | ICD-10-CM | POA: Diagnosis present

## 2012-01-30 DIAGNOSIS — M1990 Unspecified osteoarthritis, unspecified site: Secondary | ICD-10-CM | POA: Diagnosis present

## 2012-01-30 DIAGNOSIS — I509 Heart failure, unspecified: Secondary | ICD-10-CM | POA: Diagnosis present

## 2012-01-30 DIAGNOSIS — IMO0002 Reserved for concepts with insufficient information to code with codable children: Secondary | ICD-10-CM | POA: Diagnosis present

## 2012-01-30 DIAGNOSIS — R109 Unspecified abdominal pain: Principal | ICD-10-CM | POA: Diagnosis present

## 2012-01-30 DIAGNOSIS — R001 Bradycardia, unspecified: Secondary | ICD-10-CM | POA: Diagnosis present

## 2012-01-30 DIAGNOSIS — I5033 Acute on chronic diastolic (congestive) heart failure: Secondary | ICD-10-CM | POA: Diagnosis present

## 2012-01-30 DIAGNOSIS — I959 Hypotension, unspecified: Secondary | ICD-10-CM | POA: Diagnosis present

## 2012-01-30 DIAGNOSIS — E039 Hypothyroidism, unspecified: Secondary | ICD-10-CM | POA: Diagnosis present

## 2012-01-30 NOTE — ED Notes (Signed)
Pt C/O LLQ  ABD pain starting on Friday. Was seen here on Sunday, 01/29/2012. Discharged with DX of cyst on kidney, sts pt.

## 2012-01-31 ENCOUNTER — Emergency Department (HOSPITAL_COMMUNITY): Payer: Medicare Other

## 2012-01-31 ENCOUNTER — Other Ambulatory Visit: Payer: Self-pay

## 2012-01-31 ENCOUNTER — Encounter (HOSPITAL_COMMUNITY): Payer: Self-pay

## 2012-01-31 DIAGNOSIS — K5900 Constipation, unspecified: Secondary | ICD-10-CM | POA: Diagnosis present

## 2012-01-31 DIAGNOSIS — I5033 Acute on chronic diastolic (congestive) heart failure: Secondary | ICD-10-CM

## 2012-01-31 DIAGNOSIS — I959 Hypotension, unspecified: Secondary | ICD-10-CM | POA: Diagnosis present

## 2012-01-31 HISTORY — DX: Acute on chronic diastolic (congestive) heart failure: I50.33

## 2012-01-31 LAB — POCT I-STAT, CHEM 8
HCT: 39 % (ref 36.0–46.0)
Hemoglobin: 13.3 g/dL (ref 12.0–15.0)
Potassium: 4.1 mEq/L (ref 3.5–5.1)
Sodium: 139 mEq/L (ref 135–145)

## 2012-01-31 LAB — DIFFERENTIAL
Basophils Relative: 1 % (ref 0–1)
Eosinophils Absolute: 0.1 10*3/uL (ref 0.0–0.7)
Monocytes Absolute: 0.4 10*3/uL (ref 0.1–1.0)
Monocytes Relative: 7 % (ref 3–12)
Neutrophils Relative %: 70 % (ref 43–77)

## 2012-01-31 LAB — URINALYSIS, ROUTINE W REFLEX MICROSCOPIC
Bilirubin Urine: NEGATIVE
Hgb urine dipstick: NEGATIVE
Protein, ur: NEGATIVE mg/dL
Urobilinogen, UA: 1 mg/dL (ref 0.0–1.0)

## 2012-01-31 LAB — CBC
Hemoglobin: 13 g/dL (ref 12.0–15.0)
MCH: 29.6 pg (ref 26.0–34.0)
MCHC: 32.9 g/dL (ref 30.0–36.0)
RDW: 13.3 % (ref 11.5–15.5)

## 2012-01-31 MED ORDER — MAGNESIUM CITRATE PO SOLN
1.0000 | Freq: Once | ORAL | Status: AC
  Administered 2012-02-01: 1 via ORAL

## 2012-01-31 MED ORDER — FENTANYL CITRATE 0.05 MG/ML IJ SOLN
50.0000 ug | Freq: Once | INTRAMUSCULAR | Status: AC
  Administered 2012-01-31: 50 ug via INTRAVENOUS
  Filled 2012-01-31: qty 2

## 2012-01-31 MED ORDER — NITROGLYCERIN 0.4 MG SL SUBL: 0.4000 mg | SUBLINGUAL_TABLET | SUBLINGUAL | Status: DC | PRN

## 2012-01-31 MED ORDER — LEVOTHYROXINE SODIUM 75 MCG PO TABS
75.0000 ug | ORAL_TABLET | Freq: Every day | ORAL | Status: DC
  Administered 2012-01-31 – 2012-02-03 (×4): 75 ug via ORAL
  Filled 2012-01-31 (×4): qty 1

## 2012-01-31 MED ORDER — SIMVASTATIN 20 MG PO TABS
20.0000 mg | ORAL_TABLET | Freq: Every day | ORAL | Status: DC
  Administered 2012-01-31 – 2012-02-02 (×3): 20 mg via ORAL
  Filled 2012-01-31 (×4): qty 1

## 2012-01-31 MED ORDER — LISINOPRIL 40 MG PO TABS
40.0000 mg | ORAL_TABLET | Freq: Every day | ORAL | Status: DC
  Administered 2012-01-31: 40 mg via ORAL
  Filled 2012-01-31 (×2): qty 1

## 2012-01-31 MED ORDER — HYDROMORPHONE HCL PF 1 MG/ML IJ SOLN
0.5000 mg | INTRAMUSCULAR | Status: DC | PRN
  Administered 2012-01-31 – 2012-02-03 (×5): 0.5 mg via INTRAVENOUS
  Filled 2012-01-31 (×5): qty 1

## 2012-01-31 MED ORDER — HYDROCODONE-ACETAMINOPHEN 5-325 MG PO TABS
1.0000 | ORAL_TABLET | Freq: Four times a day (QID) | ORAL | Status: DC | PRN
  Administered 2012-02-01: 2 via ORAL
  Administered 2012-02-01: 1 via ORAL
  Administered 2012-02-02 – 2012-02-03 (×4): 2 via ORAL
  Filled 2012-01-31 (×7): qty 2

## 2012-01-31 MED ORDER — SODIUM CHLORIDE 0.9 % IV SOLN
INTRAVENOUS | Status: AC
  Administered 2012-01-31 – 2012-02-01 (×3): via INTRAVENOUS

## 2012-01-31 MED ORDER — POLYETHYLENE GLYCOL 3350 17 G PO PACK
17.0000 g | PACK | Freq: Every day | ORAL | Status: DC
  Administered 2012-02-01: 17 g via ORAL
  Filled 2012-01-31 (×3): qty 1

## 2012-01-31 MED ORDER — SODIUM CHLORIDE 0.9 % IV SOLN: 250.0000 mL | INTRAVENOUS | Status: DC | PRN

## 2012-01-31 MED ORDER — ONDANSETRON HCL 4 MG/2ML IJ SOLN: 4.0000 mg | Freq: Four times a day (QID) | INTRAMUSCULAR | Status: DC | PRN

## 2012-01-31 MED ORDER — SODIUM CHLORIDE 0.9 % IJ SOLN: 3.0000 mL | INTRAMUSCULAR | Status: DC | PRN

## 2012-01-31 MED ORDER — SODIUM CHLORIDE 0.9 % IJ SOLN
3.0000 mL | Freq: Two times a day (BID) | INTRAMUSCULAR | Status: DC
  Administered 2012-01-31 – 2012-02-02 (×4): 3 mL via INTRAVENOUS

## 2012-01-31 MED ORDER — ACETAMINOPHEN 650 MG RE SUPP: 650.0000 mg | Freq: Four times a day (QID) | RECTAL | Status: DC | PRN

## 2012-01-31 MED ORDER — ACETAMINOPHEN 325 MG PO TABS: 650.0000 mg | ORAL_TABLET | Freq: Four times a day (QID) | ORAL | Status: DC | PRN

## 2012-01-31 MED ORDER — HYDROCODONE-ACETAMINOPHEN 5-325 MG PO TABS
1.0000 | ORAL_TABLET | Freq: Four times a day (QID) | ORAL | Status: DC | PRN
  Administered 2012-01-31: 1 via ORAL
  Filled 2012-01-31 (×2): qty 1

## 2012-01-31 MED ORDER — KETOROLAC TROMETHAMINE 30 MG/ML IJ SOLN
30.0000 mg | Freq: Once | INTRAMUSCULAR | Status: AC
  Administered 2012-01-31: 30 mg via INTRAVENOUS
  Filled 2012-01-31: qty 1

## 2012-01-31 MED ORDER — DOCUSATE SODIUM 100 MG PO CAPS
100.0000 mg | ORAL_CAPSULE | Freq: Two times a day (BID) | ORAL | Status: DC
  Administered 2012-01-31 – 2012-02-03 (×4): 100 mg via ORAL
  Filled 2012-01-31 (×7): qty 1

## 2012-01-31 MED ORDER — ONDANSETRON HCL 4 MG/2ML IJ SOLN
4.0000 mg | Freq: Once | INTRAMUSCULAR | Status: AC
  Administered 2012-01-31: 4 mg via INTRAVENOUS
  Filled 2012-01-31: qty 2

## 2012-01-31 MED ORDER — IOHEXOL 300 MG/ML  SOLN
100.0000 mL | Freq: Once | INTRAMUSCULAR | Status: AC | PRN
  Administered 2012-01-31: 100 mL via INTRAVENOUS

## 2012-01-31 MED ORDER — METHOCARBAMOL 500 MG PO TABS
750.0000 mg | ORAL_TABLET | Freq: Once | ORAL | Status: AC
  Administered 2012-01-31: 750 mg via ORAL
  Filled 2012-01-31: qty 2

## 2012-01-31 MED ORDER — SODIUM CHLORIDE 0.9 % IV BOLUS (SEPSIS): 250.0000 mL | Freq: Once | INTRAVENOUS | Status: DC

## 2012-01-31 MED ORDER — ASPIRIN 81 MG PO CHEW
81.0000 mg | CHEWABLE_TABLET | Freq: Every day | ORAL | Status: DC
  Administered 2012-01-31 – 2012-02-03 (×4): 81 mg via ORAL
  Filled 2012-01-31 (×4): qty 1

## 2012-01-31 MED ORDER — LISINOPRIL 20 MG PO TABS
40.0000 mg | ORAL_TABLET | Freq: Every day | ORAL | Status: DC
  Administered 2012-02-01 – 2012-02-02 (×2): 40 mg via ORAL
  Filled 2012-01-31 (×3): qty 2

## 2012-01-31 MED ORDER — QUINAPRIL HCL 10 MG PO TABS: 40.0000 mg | ORAL_TABLET | Freq: Every day | ORAL | Status: DC

## 2012-01-31 MED ORDER — CARVEDILOL 3.125 MG PO TABS
3.1250 mg | ORAL_TABLET | Freq: Two times a day (BID) | ORAL | Status: DC
  Administered 2012-01-31 – 2012-02-02 (×3): 3.125 mg via ORAL
  Filled 2012-01-31 (×8): qty 1

## 2012-01-31 NOTE — Progress Notes (Signed)
Presentation so far:  Patient was admitted earlier this morning with complaints of abdominal and back pain. Workup in the emergency room was essentially unremarkable. Abdominal x-ray and CT noted large amounts of stool. MRI of lumbar spine noted some signs of disc herniation and minimal nerve root encroachment. Initial plan was to discharge the patient to home.  While getting MRI and patient was flat on her back, she was noted to have a drop in her oxygen saturations. Chest x-ray noted cardiomegaly and a BNP was ordered which was mildly elevated at 821, but the patient has no history of CHF. In addition patient was noted to be hypotensive with systolic blood pressure in the 80s. She received a fluid bolus and increasing fluid support to improve her blood pressure to 90s. With all these findings, it was felt best that the patient come in the hospital for further evaluation and treatment.  Subjective: I saw the patient in the emergency room. She'll to be doing well, abdominal discomfort was much better after sitting something for pain. She denied any shortness of breath or chest pain or palpitations. Her back was not bothering her that much. She denied any complaints.  Objective: Weight change:   Intake/Output Summary (Last 24 hours) at 01/31/12 1757 Last data filed at 01/31/12 1259  Gross per 24 hour  Intake    503 ml  Output      0 ml  Net    503 ml    Filed Vitals:   01/31/12 1530  BP: 90/52  Pulse: 59  Temp: 98.4 F (36.9 C)  Resp: 18   HEENT: Normocephalic, atraumatic, mucous members are still be dry General: Alert and oriented x2, is a little bit off on the year. Fatigue. Looks about stated age. No acute distress. Cardiovascular: Regular rate and rhythm, S1-S2, 2/6 systolic ejection murmur Lungs: Clear auscultation bilaterally Abdomen: Soft, obese, some generalized nonspecific tenderness, hypoactive bowel sounds Extremities, clubbing or cyanosis, trace pitting edema   Lab  Results: Basic Metabolic Panel:  Basename 01/31/12 0045 01/29/12 0635  NA 139 137  K 4.1 4.2  CL 109 105  CO2 -- 25  GLUCOSE 122* 106*  BUN 23 20  CREATININE 1.00 0.97  CALCIUM -- 10.1  MG -- --  PHOS -- --   Liver Function Tests:  [facility] 01/29/12 0635  AST 17  ALT 12  ALKPHOS 82  BILITOT 0.4  PROT 6.6  ALBUMIN 3.5    Basename 01/31/12 0039  LIPASE 57  AMYLASE --    CBC:  Basename 01/31/12 0045 01/31/12 0039 01/29/12 0635  WBC -- 5.3 5.1  NEUTROABS -- 3.7 2.8  HGB 13.3 13.0 --  HCT 39.0 39.5 --  MCV -- 90.0 89.9  PLT -- 140* 154   Cardiac Enzymes: No results found for this basename: CKTOTAL:3,CKMB:3,CKMBINDEX:3,TROPONINI:3 in the last 72 hours BNP:  Basename 01/31/12 0039  PROBNP 821.9*    Mr Lumbar Spine Wo Contrast 01/31/2012     1.  No acute findings or left-sided nerve root compression identified. 2.  There is slightly progressive spondylosis since 2007 associated with a moderate convex left scoliosis. There is mild resulting central and lateral recess stenosis at L3-L4 and L4-L5.  Foraminal stenosis appears mild, greatest on the right at L1-L2. 3.  Chronic hydronephrosis of the lower pole moiety of the left kidney.     Ct Abdomen Pelvis W Contrast 01/31/2012   The colon is largely filled with stool.  1.  No acute abnormalities seen within  the abdomen or pelvis. 2.  Scattered diverticulosis noted along the sigmoid colon, without evidence of diverticulitis. 3.  Chronic obstruction of the lower pole moiety of the left kidney, with a large extrarenal pelvis; mild atrophy of the right kidney.  Scattered small bilateral renal cysts seen. 4.  Diffuse calcification along the abdominal aorta and its branches. 5.  Mild degenerative change along the lumbar spine.  Medications: Scheduled Meds:   . aspirin  81 mg Oral Daily  . carvedilol  3.125 mg Oral BID WC  . docusate sodium  100 mg Oral BID  . fentaNYL  50 mcg Intravenous Once  . fentaNYL  50 mcg Intravenous  Once  . ketorolac  30 mg Intravenous Once  . levothyroxine  75 mcg Oral QAC breakfast  . lisinopril  40 mg Oral Daily  . magnesium citrate  1 Bottle Oral Once  . methocarbamol  750 mg Oral Once  . ondansetron  4 mg Intravenous Once  . polyethylene glycol  17 g Oral Daily  . simvastatin  20 mg Oral QHS  . sodium chloride  250 mL Intravenous Once  . sodium chloride  3 mL Intravenous Q12H  . DISCONTD: quinapril  40 mg Oral Daily   Continuous Infusions:   . sodium chloride 75 mL/hr at 01/31/12 1613   PRN Meds:.sodium chloride, acetaminophen, acetaminophen, HYDROcodone-acetaminophen, HYDROmorphone (DILAUDID) injection, iohexol, nitroGLYCERIN, ondansetron (ZOFRAN) IV, sodium chloride  Assessment/Plan: Patient Active Hospital Problem List: Hypotension (01/31/2012)  unclear etiology. This may been in part to patient getting narcotic medications. Since we are having to aggressively hydrate her to improve her blood pressure, she likely will be in more failure. We'll continue to monitor closely. There is no signs of sepsis.  HYPERLIPIDEMIA-MIXED (12/15/2008)  stable. Continue statin.  HYPERTENSION, BENIGN (12/15/2008)  stable. Holding antihypertensives for low blood pressure.  Abdominal pain (12/19/2011)  likely this is from chronic constipation which is likely brought on by her being hypothyroid in general. (Note TSH was normal 6 weeks ago). See below.  Back pain (01/31/2012)  she likely has some mild herniation of disc. Would recommend PT plus pain control. Would not recommend surgery.  Constipation (01/31/2012)  start bowel regimen. Colace plus MiraLAX plus one dose of mag citrate  Diastolic CHF, acute on chronic (01/31/2012)  checking 2-D echo. No previous history. Cannot diurese until patient's blood pressure is stable off of IV fluids.   LOS: 1 day   Hollice Espy 01/31/2012, 5:57 PM

## 2012-01-31 NOTE — ED Notes (Signed)
Pt is aware of her holding for admission room

## 2012-01-31 NOTE — ED Notes (Signed)
[facility] reports will obtain BNP if normal pt will be discharged form ED- If abnormal pt will go to telemetry- [facility] called for bed change

## 2012-01-31 NOTE — ED Notes (Signed)
MD at bedside. 

## 2012-01-31 NOTE — ED Provider Notes (Signed)
History     CSN: [id number]  Arrival date & time 01/30/12  2249   First MD Initiated Contact with Patient 01/31/12 0002      Chief Complaint  Patient presents with  . Abdominal Pain    (Consider location/radiation/quality/duration/timing/severity/associated sxs/prior treatment) Patient is a 78 y.o. female presenting with abdominal pain and flank pain. The history is provided by the patient and a relative. No language interpreter was used.  Abdominal Pain The primary symptoms of the illness include abdominal pain and nausea. The primary symptoms of the illness do not include shortness of breath, vomiting, dysuria, vaginal discharge or vaginal bleeding. The current episode started more than 2 days ago. The onset of the illness was sudden. The problem has been gradually worsening.  The abdominal pain began more than 2 days ago. The pain came on gradually. The abdominal pain has been gradually worsening since its onset. The abdominal pain is located in the LLQ. The severity of the abdominal pain is 10/10. The abdominal pain is relieved by nothing. Exacerbated by: nothing.  Associated with: recent visit for same with negative CT scans for acute pathology. The patient has not had a change in bowel habit. Risk factors for an acute abdominal problem include being elderly. Symptoms associated with the illness do not include chills, constipation or hematuria. Significant associated medical issues do not include inflammatory bowel disease.  Flank Pain This is a recurrent problem. The current episode started more than 2 days ago. Episode frequency: occasionally. Associated symptoms include abdominal pain. Pertinent negatives include no chest pain and no shortness of breath. The symptoms are aggravated by nothing. The symptoms are relieved by nothing. Treatments tried: vicodin. The treatment provided no relief.    Past Medical History  Diagnosis Date  . Dyslipidemia   . Hypertension   . Palpitations       PVCs/bigeminy on event in May 2009 revealing this was relatively asymptomatic  . Chest pain   . Cardiovascular disease     moderate by [facility] 01/2010  . Degenerative joint disease     Past Surgical History  Procedure Date  . Cholecystectomy   . Cesarean section     times 2    Family History  Problem Relation Age of Onset  . Coronary artery disease Father     History  Substance Use Topics  . Smoking status: Never Smoker   . Smokeless tobacco: Not on file  . Alcohol Use: No    OB History    Grav Para Term Preterm Abortions TAB SAB Ect Mult Living                  Review of Systems  Constitutional: Negative for chills.  HENT: Negative.   Eyes: Negative.   Respiratory: Negative for shortness of breath.   Cardiovascular: Negative for chest pain.  Gastrointestinal: Positive for nausea and abdominal pain. Negative for vomiting and constipation.  Genitourinary: Positive for flank pain. Negative for dysuria, hematuria, vaginal bleeding and vaginal discharge.  Musculoskeletal: Negative for arthralgias.  Skin: Negative.   Neurological: Negative.   Hematological: Negative.   Psychiatric/Behavioral: Negative.     Allergies  Review of patient's allergies indicates no known allergies.  Home Medications   Current Outpatient Rx  Name Route Sig Dispense Refill  . ASPIRIN 81 MG PO CHEW Oral Chew 81 mg by mouth daily.    Marland Kitchen CARVEDILOL 3.125 MG PO TABS Oral Take 1 tablet (3.125 mg total) by mouth 2 (two) times daily with a  meal. 90 tablet 11  . HYDROCODONE-ACETAMINOPHEN 5-500 MG PO TABS Oral Take 1-2 tablets by mouth every 6 (six) hours as needed for pain. 15 tablet 0  . LEVOTHYROXINE SODIUM 75 MCG PO TABS Oral Take 1 tablet (75 mcg total) by mouth daily. 90 tablet 11  . NITROGLYCERIN 0.4 MG SL SUBL Sublingual Place 0.4 mg under the tongue every 5 (five) minutes as needed. For chest pain    . QUINAPRIL HCL 40 MG PO TABS Oral Take 40 mg by mouth daily.    Marland Kitchen SIMVASTATIN 20 MG PO  TABS Oral Take 1 tablet (20 mg total) by mouth at bedtime. 90 tablet 11    BP 184/59  Pulse 63  Temp(Src) 97.8 F (36.6 C) (Oral)  Resp 16  Ht 4\' 11"  (1.499 m)  Wt 173 lb (78.472 kg)  BMI 34.94 kg/m2  SpO2 95%  Physical Exam  Constitutional: She is oriented to person, place, and time. She appears well-developed and well-nourished.  HENT:  Head: Normocephalic and atraumatic.  Mouth/Throat: Oropharynx is clear and moist.  Eyes: Conjunctivae are normal. Pupils are equal, round, and reactive to light.  Neck: Normal range of motion. Neck supple.  Cardiovascular: Normal rate and regular rhythm.   Pulmonary/Chest: Effort normal and breath sounds normal. She has no wheezes. She has no rales.  Abdominal: Soft. Bowel sounds are normal. There is tenderness. There is no rebound and no guarding.  Musculoskeletal: Normal range of motion. She exhibits no edema.  Neurological: She is alert and oriented to person, place, and time.  Skin: Skin is warm and dry. She is not diaphoretic.  Psychiatric: She has a normal mood and affect.    ED Course  Procedures (including critical care time)  Labs Reviewed  CBC - Abnormal; Notable for the following:    Platelets 140 (*)    All other components within normal limits  URINALYSIS, ROUTINE W REFLEX MICROSCOPIC - Abnormal; Notable for the following:    Ketones, ur 15 (*)    All other components within normal limits  POCT I-STAT, CHEM 8 - Abnormal; Notable for the following:    Glucose, Bld 122 (*)    All other components within normal limits  DIFFERENTIAL  LIPASE, BLOOD  URINE CULTURE   Ct Abdomen Pelvis Wo Contrast  01/29/2012  *RADIOLOGY REPORT*  Clinical Data: Right lower quadrant pain, left flank pain nausea  CT ABDOMEN AND PELVIS WITHOUT CONTRAST  Technique:  Multidetector CT imaging of the abdomen and pelvis was performed following the standard protocol without intravenous contrast.  Comparison: CT 06/01/2009  Findings: Lung bases are clear.   No pericardial fluid.  Non-IV contrast images demonstrate no focal hepatic lesion.  Prior cholecystectomy.  The pancreas, spleen, adrenal glands are normal. The right kidney is small.  The left kidney is small.  There is a large low density cyst extending from the left kidney. This may represent peripelvic cyst.  Small low density cyst extend from the upper pole right kidney.  There is no evidence of nephrolithiasis or ureterolithiasis.  The stomach, small bowel, and colon are normal. Diverticula the sigmoid colon without inflammation.  Abdominal aorta is normal caliber.  There is intimal calcification of the aorta.  No retroperitoneal lymphadenopathy.  In the pelvis, uterus and bladder are normal.  Ovaries are normal. No pelvic lymphadenopathy. Review of  bone windows demonstrates no aggressive osseous lesions. Degenerative changes of the spine noted.  IMPRESSION:  1.  No acute abdominal or pelvic findings. 2.  Kidneys  appear small. 3.  Bilateral simple appearing renal cysts. 4.  Sigmoid diverticulosis without diverticulitis.  Original Report Authenticated By: Genevive Bi, M.D.     No diagnosis found.    MDM          Bexley Laubach K Otillia Cordone-Rasch, MD 01/31/12 [phone]

## 2012-01-31 NOTE — ED Notes (Signed)
Pt off floor for procedure

## 2012-01-31 NOTE — ED Notes (Signed)
Blood drawn with IV start and sent to lab

## 2012-01-31 NOTE — ED Notes (Signed)
Pt given dilaudid before MRI- [place] RN administered 0.5mg - pt tolerated- Ryan from MRI reports pt has no complaints- pt returned to room- Pt informed of transfer to TCU for awaiting admission bed- vitals taken- low BP as recorded- 250cc NS bolus infused and oxygen appllied for support-   Pt monitored- BP returned to normal.   Pt denied any symptoms and was pain free.  Pt remains alert and active with care.  HOLD transfer to TCU  will reevaluate

## 2012-01-31 NOTE — ED Notes (Signed)
Vital signs stable. 

## 2012-01-31 NOTE — ED Notes (Signed)
Patient is resting comfortably. 

## 2012-01-31 NOTE — ED Notes (Signed)
[facility] called Rito Ehrlich MD responded.  Orders to continue with admission- aware of telemetry bed available- current BNP and vitals given. No additional orders at this time- pt and family made aware

## 2012-01-31 NOTE — ED Notes (Signed)
Admitting [facility] MD Rito Ehrlich present to evaluate this pt

## 2012-01-31 NOTE — ED Notes (Signed)
Patient transported to MRI 

## 2012-01-31 NOTE — H&P (Addendum)
[name] is an 78 y.o. female.    Cc: [facility] (pcp), [name] (cardiologist)  Chief Complaint: abdominal pain HPI: 78 yo female with hx of hypertension, hyperlipidemia, degenerative disk disease, has complaints of lower abdominal pain bilaterally starting on the left this past Sunday evening, it was sharp, fairly constant, with intermittent worseing of the pain.  Pt was seen in ER and prescribed pain medication and sent home.  Yesterday evening she again had pain, severe and some lower back pain.  Pt denies fever, chills, n/v, diarrhea, constipation, brbpr, black stool, dysuria, hematuria.  Muscle relaxer seemed to help.  Pt notes that she had remote colonscopy that she believes was negative.   In ED today, pt had CT scan of the abd/pelvis that showed diverticulosis but no diverticulitis.  Lipase was negative.  No evidence of hernia on CT scan. Pt will be admitted for intractable back/ lower abdominal pain.     Past Medical History  Diagnosis Date  . Dyslipidemia   . Hypertension   . Palpitations     PVCs/bigeminy on event in May 2009 revealing this was relatively asymptomatic  . Chest pain   . Cardiovascular disease     moderate by [facility] 01/2010  . Degenerative joint disease     Past Surgical History  Procedure Date  . Cholecystectomy   . Cesarean section     times 2  . Shoulder surgery     Right   . Cataract extraction     Bilateral    Family History  Problem Relation Age of Onset  . Coronary artery disease Father    Social History:  reports that she has never smoked. She does not have any smokeless tobacco history on file. She reports that she does not drink alcohol or use illicit drugs.  Allergies: No Known Allergies  Medications Prior to Admission  Medication Dose Route Frequency Provider Last Rate Last Dose  . fentaNYL (SUBLIMAZE) injection 50 mcg  50 mcg Intravenous Once April K Palumbo-Rasch, MD   50 mcg at 01/31/12 0052  . fentaNYL  (SUBLIMAZE) injection 50 mcg  50 mcg Intravenous Once April K Palumbo-Rasch, MD   50 mcg at 01/31/12 0425  . iohexol (OMNIPAQUE) 300 MG/ML solution 100 mL  100 mL Intravenous Once PRN Medication Radiologist, MD   100 mL at 01/31/12 0256  . ketorolac (TORADOL) 30 MG/ML injection 30 mg  30 mg Intravenous Once April K Palumbo-Rasch, MD   30 mg at 01/31/12 0425  . methocarbamol (ROBAXIN) tablet 750 mg  750 mg Oral Once April K Palumbo-Rasch, MD   750 mg at 01/31/12 0140  . morphine 4 MG/ML injection 4 mg  4 mg Intravenous Once Gwyneth Sprout, MD   4 mg at 01/29/12 0700  . ondansetron (ZOFRAN) injection 4 mg  4 mg Intravenous Once Gwyneth Sprout, MD   4 mg at 01/29/12 0700  . ondansetron (ZOFRAN) injection 4 mg  4 mg Intravenous Once April K Palumbo-Rasch, MD   4 mg at 01/31/12 0052  . sodium chloride 0.9 % bolus 1,000 mL  1,000 mL Intravenous Once Gwyneth Sprout, MD   1,000 mL at 01/29/12 0701   Medications Prior to Admission  Medication Sig Dispense Refill  . aspirin 81 MG chewable tablet Chew 81 mg by mouth daily.      . carvedilol (COREG) 3.125 MG tablet Take 1 tablet (3.125 mg total) by mouth 2 (two) times daily with a meal.  90 tablet  11  .  HYDROcodone-acetaminophen (VICODIN) 5-500 MG per tablet Take 1-2 tablets by mouth every 6 (six) hours as needed for pain.  15 tablet  0  . levothyroxine (SYNTHROID, LEVOTHROID) 75 MCG tablet Take 1 tablet (75 mcg total) by mouth daily.  90 tablet  11  . nitroGLYCERIN (NITROSTAT) 0.4 MG SL tablet Place 0.4 mg under the tongue every 5 (five) minutes as needed. For chest pain      . quinapril (ACCUPRIL) 40 MG tablet Take 40 mg by mouth daily.      . simvastatin (ZOCOR) 20 MG tablet Take 1 tablet (20 mg total) by mouth at bedtime.  90 tablet  11    Results for orders placed during the hospital encounter of 01/30/12 (from the past 48 hour(s))  URINALYSIS, ROUTINE W REFLEX MICROSCOPIC     Status: Abnormal   Collection Time   01/31/12 12:38 AM       Component Value Range Comment   Color, Urine YELLOW  YELLOW     APPearance CLEAR  CLEAR     Specific Gravity, Urine 1.014  1.005 - 1.030     pH 6.0  5.0 - 8.0     Glucose, UA NEGATIVE  NEGATIVE (mg/dL)    Hgb urine dipstick NEGATIVE  NEGATIVE     Bilirubin Urine NEGATIVE  NEGATIVE     Ketones, ur 15 (*) NEGATIVE (mg/dL)    Protein, ur NEGATIVE  NEGATIVE (mg/dL)    Urobilinogen, UA 1.0  0.0 - 1.0 (mg/dL)    Nitrite NEGATIVE  NEGATIVE     Leukocytes, UA NEGATIVE  NEGATIVE  MICROSCOPIC NOT DONE ON URINES WITH NEGATIVE PROTEIN, BLOOD, LEUKOCYTES, NITRITE, OR GLUCOSE <1000 mg/dL.  CBC     Status: Abnormal   Collection Time   01/31/12 12:39 AM      Component Value Range Comment   WBC 5.3  4.0 - 10.5 (K/uL)    RBC 4.39  3.87 - 5.11 (MIL/uL)    Hemoglobin 13.0  12.0 - 15.0 (g/dL)    HCT 40.9  81.1 - 91.4 (%)    MCV 90.0  78.0 - 100.0 (fL)    MCH 29.6  26.0 - 34.0 (pg)    MCHC 32.9  30.0 - 36.0 (g/dL)    RDW 78.2  95.6 - 21.3 (%)    Platelets 140 (*) 150 - 400 (K/uL)   DIFFERENTIAL     Status: Normal   Collection Time   01/31/12 12:39 AM      Component Value Range Comment   Neutrophils Relative 70  43 - 77 (%)    Neutro Abs 3.7  1.7 - 7.7 (K/uL)    Lymphocytes Relative 20  12 - 46 (%)    Lymphs Abs 1.1  0.7 - 4.0 (K/uL)    Monocytes Relative 7  3 - 12 (%)    Monocytes Absolute 0.4  0.1 - 1.0 (K/uL)    Eosinophils Relative 3  0 - 5 (%)    Eosinophils Absolute 0.1  0.0 - 0.7 (K/uL)    Basophils Relative 1  0 - 1 (%)    Basophils Absolute 0.0  0.0 - 0.1 (K/uL)   LIPASE, BLOOD     Status: Normal   Collection Time   01/31/12 12:39 AM      Component Value Range Comment   Lipase 57  11 - 59 (U/L)   POCT I-STAT, CHEM 8     Status: Abnormal   Collection Time   01/31/12 12:45 AM  Component Value Range Comment   Sodium 139  135 - 145 (mEq/L)    Potassium 4.1  3.5 - 5.1 (mEq/L)    Chloride 109  96 - 112 (mEq/L)    BUN 23  6 - 23 (mg/dL)    Creatinine, Ser 9.14  0.50 - 1.10 (mg/dL)     Glucose, Bld 782 (*) 70 - 99 (mg/dL)    Calcium, Ion 9.56  1.12 - 1.32 (mmol/L)    TCO2 21  0 - 100 (mmol/L)    Hemoglobin 13.3  12.0 - 15.0 (g/dL)    HCT 21.3  08.6 - 57.8 (%)    Ct Abdomen Pelvis Wo Contrast  01/29/2012  *RADIOLOGY REPORT*  Clinical Data: Right lower quadrant pain, left flank pain nausea  CT ABDOMEN AND PELVIS WITHOUT CONTRAST  Technique:  Multidetector CT imaging of the abdomen and pelvis was performed following the standard protocol without intravenous contrast.  Comparison: CT 06/01/2009  Findings: Lung bases are clear.  No pericardial fluid.  Non-IV contrast images demonstrate no focal hepatic lesion.  Prior cholecystectomy.  The pancreas, spleen, adrenal glands are normal. The right kidney is small.  The left kidney is small.  There is a large low density cyst extending from the left kidney. This may represent peripelvic cyst.  Small low density cyst extend from the upper pole right kidney.  There is no evidence of nephrolithiasis or ureterolithiasis.  The stomach, small bowel, and colon are normal. Diverticula the sigmoid colon without inflammation.  Abdominal aorta is normal caliber.  There is intimal calcification of the aorta.  No retroperitoneal lymphadenopathy.  In the pelvis, uterus and bladder are normal.  Ovaries are normal. No pelvic lymphadenopathy. Review of  bone windows demonstrates no aggressive osseous lesions. Degenerative changes of the spine noted.  IMPRESSION:  1.  No acute abdominal or pelvic findings. 2.  Kidneys appear small. 3.  Bilateral simple appearing renal cysts. 4.  Sigmoid diverticulosis without diverticulitis.  Original Report Authenticated By: Genevive Bi, M.D.   Ct Abdomen Pelvis W Contrast  01/31/2012  *RADIOLOGY REPORT*  Clinical Data: Left lower quadrant abdominal pain.  CT ABDOMEN AND PELVIS WITH CONTRAST  Technique:  Multidetector CT imaging of the abdomen and pelvis was performed following the standard protocol during bolus administration  of intravenous contrast.  Contrast: OMNIPAQUE IOHEXOL 300 MG/ML IJ SOLN  Comparison: CT of the abdomen and pelvis performed 01/29/2012  Findings: Minimal left basilar atelectasis is noted.  There is minimal prominence of the intrahepatic biliary ducts, within normal limits status post cholecystectomy.  The liver is otherwise unremarkable in appearance.  Scattered clips are noted along the gallbladder fossa.  The spleen is unremarkable in appearance.  The pancreas and adrenal glands are unremarkable.  There is chronic obstruction of the lower pole moiety of the left kidney, with an enlarged extrarenal pelvis.  There is mild atrophy of the right kidney.  Scattered small bilateral renal cysts are seen. No significant perinephric stranding is appreciated.  No renal or ureteral stones are seen.  No free fluid is identified.  The small bowel is unremarkable in appearance.  The stomach is within normal limits.  No acute vascular abnormalities are seen.  Diffuse calcification is noted along the abdominal aorta and its branches.  The appendix is not definitely seen.  There is no definite evidence for appendicitis.  Contrast extends to the hepatic flexure of the colon.  The colon is largely filled with stool.  Scattered diverticulosis is noted along the  sigmoid colon, without evidence of diverticulitis.  The bladder is mildly distended and grossly unremarkable in appearance.  The uterus is within normal limits.  The ovaries are relatively symmetric; no suspicious adnexal masses are seen.  No inguinal lymphadenopathy is seen.  No acute osseous abnormalities are identified.  Multilevel vacuum phenomenon is noted along the lumbar spine.  There is mild grade 1 anterolisthesis of L5 on S1, with associated facet disease.  IMPRESSION:  1.  No acute abnormalities seen within the abdomen or pelvis. 2.  Scattered diverticulosis noted along the sigmoid colon, without evidence of diverticulitis. 3.  Chronic obstruction of the lower  pole moiety of the left kidney, with a large extrarenal pelvis; mild atrophy of the right kidney.  Scattered small bilateral renal cysts seen. 4.  Diffuse calcification along the abdominal aorta and its branches. 5.  Mild degenerative change along the lumbar spine.  Original Report Authenticated By: Tonia Ghent, M.D.    Review of Systems  Constitutional: Negative for fever, chills and weight loss.  HENT: Negative for hearing loss.   Respiratory: Negative for cough, hemoptysis and sputum production.   Cardiovascular: Negative for chest pain and palpitations.  Gastrointestinal: Positive for heartburn and abdominal pain. Negative for nausea, vomiting, diarrhea, constipation, blood in stool and melena.  Genitourinary: Negative for dysuria and urgency.  Musculoskeletal: Positive for back pain. Negative for falls.  Skin: Negative for itching and rash.  Neurological: Negative for dizziness, tingling, tremors and headaches.  Psychiatric/Behavioral: Negative for depression and suicidal ideas.    Blood pressure 121/49, pulse 61, temperature 97.9 F (36.6 C), temperature source Oral, resp. rate 16, height 4\' 11"  (1.499 m), weight 78.472 kg (173 lb), SpO2 93.00%. Physical Exam  Constitutional: She is oriented to person, place, and time. She appears well-developed and well-nourished.  HENT:  Head: Normocephalic and atraumatic.  Eyes: Conjunctivae and EOM are normal. Pupils are equal, round, and reactive to light. Scleral icterus is present.  Neck: Normal range of motion. Neck supple. No JVD present. No tracheal deviation present. No thyromegaly present.  Cardiovascular: Normal rate and regular rhythm.  Exam reveals no gallop and no friction rub.   Murmur heard. Respiratory: Effort normal and breath sounds normal. No respiratory distress. She has no wheezes. She has no rales. She exhibits no tenderness.  GI: She exhibits no distension. There is no tenderness. There is no rebound and no guarding.    Musculoskeletal: Normal range of motion. She exhibits no edema and no tenderness.  Lymphadenopathy:    She has no cervical adenopathy.  Neurological: She is alert and oriented to person, place, and time. She displays normal reflexes. No cranial nerve deficit. She exhibits normal muscle tone. Coordination normal.  Skin: Skin is warm and dry. No rash noted. No erythema. No pallor.  Psychiatric: She has a normal mood and affect. Her behavior is normal.   SLR negative, reflexes 2+ symmetric, diffuse with downgoing toes bilaterally, motor 5/5 in all 4 ext  Assessment/Plan  Abdominal pain: ? Could this be coming from her back, vs adhesions Back pain: check MRI L-spine to determine if she has some form of radiculopathy Pain control with dilaudid, robaxin 500mg  po qid prn Hypertension:  Continue on carvedilol and quinapril Hyperlipidemia:  Continue on simvastatin Hypothyroidism:  Continue levothyroxine Note if MRI L-spine unrevealing then she may need GI w/up Pearson Grippe 01/31/2012, 6:34 AM

## 2012-01-31 NOTE — ED Notes (Signed)
Patient denies pain and is resting comfortably.  

## 2012-01-31 NOTE — ED Notes (Signed)
Family at bedside. 

## 2012-02-01 DIAGNOSIS — I059 Rheumatic mitral valve disease, unspecified: Secondary | ICD-10-CM

## 2012-02-01 LAB — URINE CULTURE

## 2012-02-01 LAB — DIFFERENTIAL
Basophils Relative: 0 % (ref 0–1)
Monocytes Relative: 8 % (ref 3–12)
Neutro Abs: 2.7 10*3/uL (ref 1.7–7.7)
Neutrophils Relative %: 58 % (ref 43–77)

## 2012-02-01 LAB — CBC
Hemoglobin: 11.4 g/dL — ABNORMAL LOW (ref 12.0–15.0)
MCHC: 32.3 g/dL (ref 30.0–36.0)
Platelets: 121 10*3/uL — ABNORMAL LOW (ref 150–400)
RBC: 3.85 MIL/uL — ABNORMAL LOW (ref 3.87–5.11)

## 2012-02-01 LAB — COMPREHENSIVE METABOLIC PANEL
CO2: 24 mEq/L (ref 19–32)
Calcium: 9 mg/dL (ref 8.4–10.5)
Creatinine, Ser: 0.95 mg/dL (ref 0.50–1.10)
GFR calc Af Amer: 62 mL/min — ABNORMAL LOW (ref >90)
GFR calc non Af Amer: 53 mL/min — ABNORMAL LOW (ref >90)
Glucose, Bld: 102 mg/dL — ABNORMAL HIGH (ref 70–99)
Total Bilirubin: 0.4 mg/dL (ref 0.3–1.2)

## 2012-02-01 MED ORDER — FLEET ENEMA 7-19 GM/118ML RE ENEM
1.0000 | ENEMA | Freq: Once | RECTAL | Status: AC
  Administered 2012-02-01: 1 via RECTAL
  Filled 2012-02-01: qty 1

## 2012-02-01 MED ORDER — VITAMINS A & D EX OINT
TOPICAL_OINTMENT | CUTANEOUS | Status: AC
  Administered 2012-02-01: 23:00:00
  Filled 2012-02-01: qty 5

## 2012-02-01 NOTE — Progress Notes (Signed)
Attempted to insert Foley catheter with no success. Pt tolerated fair. After attempt pt got up to [facility] and voided adequate amount. Julio Sicks RN

## 2012-02-01 NOTE — Progress Notes (Signed)
  Echocardiogram 2D Echocardiogram has been performed.  Leotha Voeltz L 02/01/2012, 2:34 PM

## 2012-02-01 NOTE — Progress Notes (Signed)
[facility] Inpatient Progress Note  02/01/2012 Admission History: Patient was admitted with complaints of abdominal and back pain. Workup in the emergency room was essentially unremarkable. Abdominal x-ray and CT noted large amounts of stool. MRI of lumbar spine noted some signs of disc herniation and minimal nerve root encroachment. Initial plan was to discharge the patient to home. While getting MRI and patient was flat on her back, she was noted to have a drop in her oxygen saturations. Chest x-ray noted cardiomegaly and a BNP was ordered which was mildly elevated at 821, but the patient has no history of CHF. In addition patient was noted to be hypotensive with systolic blood pressure in the 80s. She received a fluid bolus and increasing fluid support to improve her blood pressure to 90s. With all these findings, it was felt best that the patient come in the hospital for further evaluation and treatment.  Subjective: Pt says her abdominal pain is better but still no BM.  Pt says she would like to try enema.  Pt is eating well and no nausea or vomiting. No SOB.  Objective:  Vital signs in last 24 hours: Filed Vitals:   02/01/12 0621 02/01/12 0842 02/01/12 1055 02/01/12 1057  BP: 112/62 165/62 138/67   Pulse: 49 52  63  Temp: 97.3 F (36.3 C) 97.5 F (36.4 C)    TempSrc: Oral Oral    Resp: 18 18    Height:      Weight: 75.9 kg (167 lb 5.3 oz)     SpO2: 97% 100%     Weight change: -2.872 kg (-6 lb 5.3 oz)  Intake/Output Summary (Last 24 hours) at 02/01/12 1210 Last data filed at 02/01/12 1000  Gross per 24 hour  Intake   2770 ml  Output    900 ml  Net   1870 ml   No results found for this basename: HGBA1C   Lab Results  Component Value Date   LDLCALC 64 10/03/2011   CREATININE 0.95 02/01/2012    Review of Systems As above, otherwise all reviewed and reported negative  Physical Exam General: Alert and oriented, eating jello, Looks about stated age. No acute  distress.  HEENT: Normocephalic, atraumatic, mucous membranes moist Cardiovascular: Regular rate and rhythm, S1-S2, 2/6 systolic ejection murmur  Lungs: Clear auscultation bilaterally  Abdomen: Soft, obese, nontender, no guarding, no masses palpated, normal BS.  Extremities: no clubbing or cyanosis, trace pitting edema   Lab Results: Results for orders placed during the hospital encounter of 01/30/12 (from the past 24 hour(s))  COMPREHENSIVE METABOLIC PANEL     Status: Abnormal   Collection Time   02/01/12  5:01 AM      Component Value Range   Sodium 137  135 - 145 (mEq/L)   Potassium 4.4  3.5 - 5.1 (mEq/L)   Chloride 109  96 - 112 (mEq/L)   CO2 24  19 - 32 (mEq/L)   Glucose, Bld 102 (*) 70 - 99 (mg/dL)   BUN 21  6 - 23 (mg/dL)   Creatinine, Ser 1.61  0.50 - 1.10 (mg/dL)   Calcium 9.0  8.4 - 09.6 (mg/dL)   Total Protein 5.9 (*) 6.0 - 8.3 (g/dL)   Albumin 2.9 (*) 3.5 - 5.2 (g/dL)   AST 20  0 - 37 (U/L)   ALT 19  0 - 35 (U/L)   Alkaline Phosphatase 69  39 - 117 (U/L)   Total Bilirubin 0.4  0.3 - 1.2 (mg/dL)   GFR  calc non Af Amer 53 (*) >90 (mL/min)   GFR calc Af Amer 62 (*) >90 (mL/min)  CBC     Status: Abnormal   Collection Time   02/01/12  5:01 AM      Component Value Range   WBC 4.7  4.0 - 10.5 (K/uL)   RBC 3.85 (*) 3.87 - 5.11 (MIL/uL)   Hemoglobin 11.4 (*) 12.0 - 15.0 (g/dL)   HCT 14.7 (*) 82.9 - 46.0 (%)   MCV 91.7  78.0 - 100.0 (fL)   MCH 29.6  26.0 - 34.0 (pg)   MCHC 32.3  30.0 - 36.0 (g/dL)   RDW 56.2  13.0 - 86.5 (%)   Platelets 121 (*) 150 - 400 (K/uL)  DIFFERENTIAL     Status: Normal   Collection Time   02/01/12  5:01 AM      Component Value Range   Neutrophils Relative 58  43 - 77 (%)   Neutro Abs 2.7  1.7 - 7.7 (K/uL)   Lymphocytes Relative 30  12 - 46 (%)   Lymphs Abs 1.4  0.7 - 4.0 (K/uL)   Monocytes Relative 8  3 - 12 (%)   Monocytes Absolute 0.4  0.1 - 1.0 (K/uL)   Eosinophils Relative 4  0 - 5 (%)   Eosinophils Absolute 0.2  0.0 - 0.7 (K/uL)    Basophils Relative 0  0 - 1 (%)   Basophils Absolute 0.0  0.0 - 0.1 (K/uL)    Micro Results: Recent Results (from the past 240 hour(s))  URINE CULTURE     Status: Normal (Preliminary result)   Collection Time   01/31/12 12:38 AM      Component Value Range Status Comment   Specimen Description URINE, RANDOM   Final    Special Requests NONE   Final    Culture  Setup Time [id number]   Final    Colony Count PENDING   Incomplete    Culture Culture reincubated for better growth   Final    Report Status PENDING   Incomplete     Medications:  Scheduled Meds:   . aspirin  81 mg Oral Daily  . carvedilol  3.125 mg Oral BID WC  . docusate sodium  100 mg Oral BID  . levothyroxine  75 mcg Oral QAC breakfast  . lisinopril  40 mg Oral Daily  . magnesium citrate  1 Bottle Oral Once  . polyethylene glycol  17 g Oral Daily  . simvastatin  20 mg Oral QHS  . sodium chloride  250 mL Intravenous Once  . sodium chloride  3 mL Intravenous Q12H  . DISCONTD: lisinopril  40 mg Oral Daily   Continuous Infusions:   . sodium chloride 75 mL/hr at 02/01/12 0440   PRN Meds:.sodium chloride, acetaminophen, acetaminophen, HYDROcodone-acetaminophen, HYDROmorphone (DILAUDID) injection, nitroGLYCERIN, ondansetron (ZOFRAN) IV, sodium chloride, DISCONTD: HYDROcodone-acetaminophen  Assessment/Plan: Hypotension (01/31/2012) Currently resolved now after gentle IVFs. This may been in part to patient getting narcotic medications.  Will follow.   HYPERLIPIDEMIA-MIXED (12/15/2008) stable. Continue statin.   HYPERTENSION, BENIGN (12/15/2008) stable. Holding antihypertensives for low blood pressure.   Abdominal pain (12/19/2011)  - currently symptoms resolved Suspect that this is from chronic constipation which is likely brought on by her being hypothyroid in general. (Note TSH was normal 6 weeks ago).    Back pain (01/31/2012) Pt has some mild herniation of lumbar discs. Would recommend PT plus pain control. Would not  recommend surgery.   Constipation (01/31/2012) Intensify bowel regimen.  Enema today and add lactulose.    Diastolic CHF, acute on chronic (01/31/2012) checking 2-D echo. Reviewed Echo from 2010 No CHF seen.  Monitoring I/Os.  Currently no symptoms of decompensation.  Will monitor.    LOS: 2 days   Kjuan Seipp 02/01/2012, 12:10 PM   Cleora Fleet, MD, CDE, FAAFP [facility] [place], [place]  [phone]

## 2012-02-01 NOTE — Progress Notes (Signed)
Pt complaint of a stabbing pain at night in her left lower abd. States it normally only happens at night. Pain meds given. Pt's HR in the 50's on admission, occasionally dropping into the 40's through the night. Pt is asymptomatic with bradycardia, no complaints. BP is better this am- 112/62. Will continue to monitor. Arletta Lumadue, [place] Edwards RN

## 2012-02-02 ENCOUNTER — Inpatient Hospital Stay (HOSPITAL_COMMUNITY): Payer: Medicare Other

## 2012-02-02 LAB — BASIC METABOLIC PANEL
BUN: 17 mg/dL (ref 6–23)
Calcium: 9.2 mg/dL (ref 8.4–10.5)
Creatinine, Ser: 0.85 mg/dL (ref 0.50–1.10)
GFR calc Af Amer: 71 mL/min — ABNORMAL LOW (ref >90)
GFR calc non Af Amer: 61 mL/min — ABNORMAL LOW (ref >90)
Glucose, Bld: 100 mg/dL — ABNORMAL HIGH (ref 70–99)

## 2012-02-02 MED ORDER — LACTULOSE 10 GM/15ML PO SOLN
20.0000 g | Freq: Two times a day (BID) | ORAL | Status: DC
  Administered 2012-02-02: 20 g via ORAL
  Filled 2012-02-02 (×3): qty 30

## 2012-02-02 NOTE — Evaluation (Signed)
Occupational Therapy Evaluation Patient Details Name: [name] MRN: [id number] DOB: 1925/07/21 Today's Date: 02/02/2012  Problem List:  Patient Active Problem List  Diagnoses  . HYPERLIPIDEMIA-MIXED  . HYPERTENSION, BENIGN  . CAROTID ARTERY DISEASE  . PALPITATIONS  . SHORTNESS OF BREATH  . CHEST PAIN-UNSPECIFIED  . PVC's (premature ventricular contractions)  . Abdominal pain  . Back pain  . Constipation  . Hypotension  . Diastolic CHF, acute on chronic    Past Medical History:  Past Medical History  Diagnosis Date  . Dyslipidemia   . Hypertension   . Palpitations     PVCs/bigeminy on event in May 2009 revealing this was relatively asymptomatic  . Chest pain   . Cardiovascular disease     moderate by USN 01/2010  . Degenerative joint disease    Past Surgical History:  Past Surgical History  Procedure Date  . Cholecystectomy   . Cesarean section     times 2  . Shoulder surgery     Right   . Cataract extraction     Bilateral    OT Assessment/Plan/Recommendation OT Assessment Clinical Impression Statement: Pt is an 78 yo female living independently who presents with hypotension. Skilled OT to maximize independence w/BADLs to mod I level for safe d/c home with HHOT. OT Recommendation/Assessment: Patient will need skilled OT in the acute care venue OT Problem List: Decreased activity tolerance;Decreased knowledge of use of DME or AE;Impaired balance (sitting and/or standing) OT Therapy Diagnosis : Generalized weakness OT Plan OT Frequency: Min 2X/week OT Treatment/Interventions: Self-care/ADL training;Therapeutic activities;DME and/or AE instruction;Patient/family education OT Recommendation Follow Up Recommendations: Home health OT Equipment Recommended: None recommended by OT Individuals Consulted Consulted and Agree with Results and Recommendations: Patient OT Goals Acute Rehab OT Goals OT Goal Formulation: With patient Time For Goal Achievement: 2  weeks ADL Goals Pt Will Perform Grooming: with modified independence ADL Goal: Grooming - Progress: Goal set today Pt Will Transfer to Toilet: with modified independence;Ambulation;Regular height toilet ADL Goal: Toilet Transfer - Progress: Goal set today Pt Will Perform Toileting - Clothing Manipulation: with modified independence;Standing ADL Goal: Toileting - Clothing Manipulation - Progress: Goal set today Pt Will Perform Toileting - Hygiene: with modified independence;Sit to stand from 3-in-1/toilet ADL Goal: Toileting - Hygiene - Progress: Goal set today Pt Will Perform Tub/Shower Transfer: Tub transfer;with modified independence;Ambulation ADL Goal: Tub/Shower Transfer - Progress: Goal set today  OT Evaluation Precautions/Restrictions  Precautions Precautions: Fall Prior Functioning Home Living Lives With: Alone Type of Home: House Home Layout: One level Home Access: Stairs to enter Entrance Stairs-Rails: Can reach both;Left;Right Entrance Stairs-Number of Steps: 3 Bathroom Shower/Tub: Engineer, manufacturing systems: Standard Home Adaptive Equipment: Walker - rolling;Straight cane;Shower chair with back Prior Function Level of Independence: Independent with basic ADLs;Independent with transfers;Independent with gait;Independent with homemaking with ambulation Driving: Yes Vocation: Retired ADL ADL Grooming: Performed;Wash/dry hands;Supervision/safety Where Assessed - Grooming: Standing at sink Upper Body Bathing: Simulated;Supervision/safety Where Assessed - Upper Body Bathing: Standing at sink Lower Body Bathing: Simulated;Minimal assistance Where Assessed - Lower Body Bathing: Sit to stand from chair Upper Body Dressing: Performed;Supervision/safety Where Assessed - Upper Body Dressing: Standing Lower Body Dressing: Simulated;Minimal assistance Where Assessed - Lower Body Dressing: Sit to stand from chair Toilet Transfer: Performed;Supervision/safety Toilet  Transfer Method: Proofreader: Regular height toilet Toileting - Clothing Manipulation: Performed;Supervision/safety Where Assessed - Toileting Clothing Manipulation: Sit to stand from 3-in-1 or toilet Toileting - Hygiene: Performed;Supervision/safety Where Assessed - Toileting Hygiene: Sit to stand  from 3-in-1 or toilet Tub/Shower Transfer: Not assessed Tub/Shower Transfer Method: Not assessed Ambulation Related to ADLs: Pt ambulated to bathroom taking short, shuffling steps. Heart rate at rest was 49, increased to mid 70s with mobility. Vision/Perception    Cognition Cognition Arousal/Alertness: Awake/alert Overall Cognitive Status: Appears within functional limits for tasks assessed Sensation/Coordination Sensation Light Touch: Appears Intact Extremity Assessment RUE Assessment RUE Assessment: Within Functional Limits LUE Assessment LUE Assessment: Within Functional Limits Mobility  Bed Mobility Bed Mobility: No (pt up in recliner upon arrival) Transfers Sit to Stand: 5: Supervision;With upper extremity assist;From toilet;From chair/3-in-1 Sit to Stand Details (indicate cue type and reason): supervision for safety 2* low HR and BP, pt reports slightly dizzy upon standing Stand to Sit: 5: Supervision;With upper extremity assist;To chair/3-in-1;To toilet Exercises   End of Session OT - End of Session Activity Tolerance: Patient tolerated treatment well Patient left: in chair;with call bell in reach General Behavior During Session: [facility] for tasks performed Cognition: [facility] for tasks performed   Ellouise Mcwhirter A OTR/L [phone] 02/02/2012, 1:13 PM

## 2012-02-02 NOTE — Progress Notes (Signed)
[facility] Inpatient Progress Note  02/02/2012  Subjective: Pt still having back pain and some report of left groin pain today.  Ambulating with PT today. Sitting up in chair.   Objective:  Vital signs in last 24 hours: Filed Vitals:   02/02/12 0506 02/02/12 0900 02/02/12 1054 02/02/12 1254  BP: 92/43 137/57 122/55 145/71  Pulse: 50  49 55  Temp: 97.8 F (36.6 C)   98.1 F (36.7 C)  TempSrc: Oral   Oral  Resp: 20   19  Height:      Weight: 75.388 kg (166 lb 3.2 oz)     SpO2: 96%   95%   Weight change: -0.212 kg (-7.5 oz)  Intake/Output Summary (Last 24 hours) at 02/02/12 1518 Last data filed at 02/02/12 1406  Gross per 24 hour  Intake   1510 ml  Output      0 ml  Net   1510 ml   No results found for this basename: HGBA1C   Lab Results  Component Value Date   LDLCALC 64 10/03/2011   CREATININE 0.85 02/02/2012    Review of Systems As above, otherwise all reviewed and reported negative  Physical Exam General: Alert and oriented,sitting up in chair, Looks stated age. No acute distress.  HEENT: Normocephalic, atraumatic, mucous membranes moist  Cardiovascular: Regular rate and rhythm, S1-S2 Lungs: Clear auscultation bilaterally  Abdomen: Soft, obese, nontender, no guarding, no masses palpated, normal BS.  Extremities: no clubbing or cyanosis, trace pitting edema   Lab Results: Results for orders placed during the hospital encounter of 01/30/12 (from the past 24 hour(s))  BASIC METABOLIC PANEL     Status: Abnormal   Collection Time   02/02/12  5:03 AM      Component Value Range   Sodium 139  135 - 145 (mEq/L)   Potassium 4.0  3.5 - 5.1 (mEq/L)   Chloride 108  96 - 112 (mEq/L)   CO2 28  19 - 32 (mEq/L)   Glucose, Bld 100 (*) 70 - 99 (mg/dL)   BUN 17  6 - 23 (mg/dL)   Creatinine, Ser 1.61  0.50 - 1.10 (mg/dL)   Calcium 9.2  8.4 - 09.6 (mg/dL)   GFR calc non Af Amer 61 (*) >90 (mL/min)   GFR calc Af Amer 71 (*) >90 (mL/min)    Micro Results: Recent  Results (from the past 240 hour(s))  URINE CULTURE     Status: Normal   Collection Time   01/31/12 12:38 AM      Component Value Range Status Comment   Specimen Description URINE, RANDOM   Final    Special Requests NONE   Final    Culture  Setup Time [id number]   Final    Colony Count 30,000 COLONIES/ML   Final    Culture     Final    Value: Multiple bacterial morphotypes present, none predominant. Suggest appropriate recollection if clinically indicated.   Report Status 02/01/2012 FINAL   Final     Medications:  Scheduled Meds:   . aspirin  81 mg Oral Daily  . carvedilol  3.125 mg Oral BID WC  . docusate sodium  100 mg Oral BID  . levothyroxine  75 mcg Oral QAC breakfast  . lisinopril  40 mg Oral Daily  . polyethylene glycol  17 g Oral Daily  . simvastatin  20 mg Oral QHS  . sodium chloride  250 mL Intravenous Once  . sodium chloride  3 mL Intravenous Q12H  .  vitamin A & D       Continuous Infusions:  PRN Meds:.sodium chloride, acetaminophen, acetaminophen, HYDROcodone-acetaminophen, HYDROmorphone (DILAUDID) injection, nitroGLYCERIN, ondansetron (ZOFRAN) IV, sodium chloride  Assessment/Plan: Hypotension (01/31/2012) Currently resolved now after gentle IVFs. This may been in part to patient getting narcotic medications. Will follow.   HYPERLIPIDEMIA-MIXED (12/15/2008) stable. Continue statin.   HYPERTENSION, BENIGN (12/15/2008) Some labile BPs noted, slowly resuming home BP meds.    Abdominal pain (12/19/2011) - currently symptoms resolved Suspect that this is from chronic constipation which is likely brought on by her being hypothyroid in general.  Back pain (01/31/2012) Pt has some mild herniation of lumbar discs. Would recommend PT plus pain control. Would not recommend surgery.  Check xray hips and repeat xray abdomen.  Constipation (01/31/2012) Pt had large BM yesterday after enema.     LOS: 3 days   [name] 02/02/2012, 3:18 PM   Cleora Fleet, MD,  CDE, FAAFP [facility] [place], [place]  [phone]

## 2012-02-02 NOTE — Evaluation (Addendum)
Physical Therapy Evaluation Patient Details Name: [name] MRN: [id number] DOB: 04-Aug-1925 Today's Date: 02/02/2012  Problem List:  Patient Active Problem List  Diagnoses  . HYPERLIPIDEMIA-MIXED  . HYPERTENSION, BENIGN  . CAROTID ARTERY DISEASE  . PALPITATIONS  . SHORTNESS OF BREATH  . CHEST PAIN-UNSPECIFIED  . PVC's (premature ventricular contractions)  . Abdominal pain  . Back pain  . Constipation  . Hypotension  . Diastolic CHF, acute on chronic    Past Medical History:  Past Medical History  Diagnosis Date  . Dyslipidemia   . Hypertension   . Palpitations     PVCs/bigeminy on event in May 2009 revealing this was relatively asymptomatic  . Chest pain   . Cardiovascular disease     moderate by USN 01/2010  . Degenerative joint disease    Past Surgical History:  Past Surgical History  Procedure Date  . Cholecystectomy   . Cesarean section     times 2  . Shoulder surgery     Right   . Cataract extraction     Bilateral    PT Assessment/Plan/Recommendation PT Assessment Clinical Impression Statement: Pt admitted with hypotension and has hx of back pain.  Pt describes occasional creepy crawling feeling around L buttocks/hip and down thigh however not present on evaluation.  Pt reports she has had MRI with no significant findings.  Pt would benefit from acute PT services in order to improve activity tolerance and increase mobility for safe d/c back home alone. PT Recommendation/Assessment: Patient will need skilled PT in the acute care venue PT Problem List: Decreased activity tolerance;Decreased strength;Decreased mobility;Pain;Decreased knowledge of use of DME;Cardiopulmonary status limiting activity PT Therapy Diagnosis : Difficulty walking;Acute pain PT Plan PT Frequency: Min 3X/week PT Treatment/Interventions: Gait training;DME instruction;Stair training;Functional mobility training;Therapeutic activities;Therapeutic exercise;Patient/family education PT  Recommendation Follow Up Recommendations: Home health PT Equipment Recommended: None recommended by PT PT Goals  Acute Rehab PT Goals PT Goal Formulation: With patient Time For Goal Achievement: 7 days Pt will go Sit to Stand: with modified independence PT Goal: Sit to Stand - Progress: Goal set today Pt will go Stand to Sit: with modified independence PT Goal: Stand to Sit - Progress: Goal set today Pt will Ambulate: >150 feet;with modified independence;with least restrictive assistive device PT Goal: Ambulate - Progress: Goal set today Pt will Go Up / Down Stairs: 3-5 stairs;with rail(s) PT Goal: Up/Down Stairs - Progress: Goal set today Pt will Perform Home Exercise Program: with supervision, verbal cues required/provided PT Goal: Perform Home Exercise Program - Progress: Goal set today  PT Evaluation Precautions/Restrictions  Precautions Precautions: Fall Prior Functioning  Home Living Lives With: Alone Type of Home: House Home Layout: One level Home Access: Stairs to enter Entrance Stairs-Rails: Can reach both;Left;Right Entrance Stairs-Number of Steps: 3 Bathroom Shower/Tub: Engineer, manufacturing systems: Standard Home Adaptive Equipment: Walker - rolling;Straight cane;Shower chair with back Prior Function Level of Independence: Independent with basic ADLs;Independent with transfers;Independent with gait;Independent with homemaking with ambulation Driving: Yes Vocation: Retired Financial risk analyst Arousal/Alertness: Awake/alert Overall Cognitive Status: Appears within functional limits for tasks assessed Sensation/Coordination Sensation Light Touch: Appears Intact Extremity Assessment RLE Strength RLE Overall Strength Comments: grossly 4/5 LLE Strength LLE Overall Strength Comments: grossly 4-/5, pt reports increased L groin pain with hip flexion Mobility (including Balance) Bed Mobility Bed Mobility: No (pt up in recliner upon arrival) Transfers Transfers:  Yes Sit to Stand: 5: Supervision;With upper extremity assist;From toilet;From chair/3-in-1 Sit to Stand Details (indicate cue type and reason):  supervision for safety 2* low HR and BP, pt reports slightly dizzy upon standing Stand to Sit: 5: Supervision;With upper extremity assist;To chair/3-in-1;To toilet Ambulation/Gait Ambulation/Gait: Yes Ambulation/Gait Assistance: 4: Min assist Ambulation/Gait Assistance Details (indicate cue type and reason): min/guard, pt reaching out for doorframe and foot of bed however declined using RW, only ambulated to bathroom then back to recliner 2* low BP and HR (HR did increase to 79 while in bathroom) Ambulation Distance (Feet): 10 Feet Assistive device: None Gait Pattern: Step-through pattern;Decreased stride length;Trendelenburg    Exercise    End of Session PT - End of Session Activity Tolerance: Patient tolerated treatment well Patient left: in chair;with call bell in reach General Behavior During Session: [facility] for tasks performed Cognition: [facility] for tasks performed [place] with OT.  Maida Sale E 02/02/2012, 12:42 PM Pager: [phone]

## 2012-02-03 DIAGNOSIS — R001 Bradycardia, unspecified: Secondary | ICD-10-CM | POA: Diagnosis present

## 2012-02-03 DIAGNOSIS — M5126 Other intervertebral disc displacement, lumbar region: Secondary | ICD-10-CM | POA: Diagnosis present

## 2012-02-03 DIAGNOSIS — M5135 Other intervertebral disc degeneration, thoracolumbar region: Secondary | ICD-10-CM | POA: Diagnosis present

## 2012-02-03 MED ORDER — HYDROCODONE-ACETAMINOPHEN 5-325 MG PO TABS: 1.0000 | ORAL_TABLET | Freq: Four times a day (QID) | ORAL | Status: AC | PRN

## 2012-02-03 MED ORDER — LACTULOSE 10 GM/15ML PO SOLN: 20.0000 g | Freq: Two times a day (BID) | ORAL | Status: AC

## 2012-02-03 NOTE — Progress Notes (Signed)
Pt selected [facility] for Home Health needs. Referral given to [facility]. mp

## 2012-02-03 NOTE — Discharge Summary (Signed)
HOSPITAL DISCHARGE SUMMARY   @n   [name], 78 y.o., DOB Jul 03, 1925, MRN [id number]  Admission date: 01/30/2012 Discharge Date: 02/03/2012  Primary MD Emeterio Reeve, MD, MD  Admitting Physician Hollice Espy, MD  Admission Diagnosis  Abdominal pain [789.0] abdominal pain  Discharge Diagnoses:   Active Hospital Problems  Diagnoses Date Noted   . Back pain 01/31/2012   . Diastolic CHF, acute on chronic 01/31/2012   . Abdominal pain 12/19/2011   . HYPERLIPIDEMIA-MIXED 12/15/2008   . HYPERTENSION, BENIGN 12/15/2008     Resolved Hospital Problems  Diagnoses Date Noted Date Resolved  . Hypotension 01/31/2012 02/03/2012  . Constipation 01/31/2012 02/03/2012    Past Medical History  Diagnosis Date  . Dyslipidemia   . Hypertension   . Palpitations     PVCs/bigeminy on event in May 2009 revealing this was relatively asymptomatic  . Chest pain   . Cardiovascular disease     moderate by USN 01/2010  . Degenerative joint disease     Past Surgical History  Procedure Date  . Cholecystectomy   . Cesarean section     times 2  . Shoulder surgery     Right   . Cataract extraction     Bilateral    Significant Tests:  See full reports for all details   Transthoracic Echocardiography  Patient: [name], [name] MR #: [id number] Study Date: 02/01/2012 Gender: F Age: 80 Height: 149.9cm Weight: 75.9kg BSA: 1.24m^2 Pt. Status: Room: 1422  PERFORMING [place], [facility] [place], [place] SONOGRAPHER Melissa Morford, RDCS cc:  ------------------------------------------------------------ LV EF: 55% - 60%  ------------------------------------------------------------ Indications: CHF - 428.0.  ------------------------------------------------------------ History: PMH: Chest pain. Risk factors: Dyslipidemia.  ------------------------------------------------------------ Study Conclusions  - Left ventricle: The cavity size was normal. Wall  thickness was normal. Systolic function was normal. The estimated ejection fraction was in the range of 55% to 60%. - Aortic valve: Trivial regurgitation. - Mitral valve: Mild to moderate regurgitation. - Left atrium: The atrium was moderately dilated. - Atrial septum: No defect or patent foramen ovale was identified. - Pulmonary arteries: PA peak pressure: 44mm Hg (S). Transthoracic echocardiography. M-mode, complete 2D, spectral Doppler, and color Doppler. Height: Height: 149.9cm. Height: 59in. Weight: Weight: 75.9kg. Weight: 167lb. Body mass index: BMI: 33.8kg/m^2. Body surface area: BSA: 1.40m^2. Blood pressure: 112/62. Patient status: Inpatient. Location: Bedside.  ------------------------------------------------------------  ------------------------------------------------------------ Left ventricle: The cavity size was normal. Wall thickness was normal. Systolic function was normal. The estimated ejection fraction was in the range of 55% to 60%.  ------------------------------------------------------------ Aortic valve: Mildly thickened leaflets. Doppler: Trivial regurgitation.  ------------------------------------------------------------ Aorta: The aorta was normal, not dilated, and non-diseased.  ------------------------------------------------------------ Mitral valve: Doppler: Mild to moderate regurgitation. Peak gradient: 5mm Hg (D).  ------------------------------------------------------------ Left atrium: The atrium was moderately dilated.  ------------------------------------------------------------ Atrial septum: No defect or patent foramen ovale was identified.  ------------------------------------------------------------ Right ventricle: The cavity size was normal. Wall thickness was normal. Systolic function was normal.  ------------------------------------------------------------ Pulmonic valve: Doppler: Trivial  regurgitation.  ------------------------------------------------------------ Tricuspid valve: Doppler: Mild regurgitation.  ------------------------------------------------------------ Right atrium: The atrium was normal in size.  ------------------------------------------------------------ Pericardium: The pericardium was normal in appearance.  ------------------------------------------------------------ Post procedure conclusions Ascending Aorta:  - The aorta was normal, not dilated, and non-diseased.  ------------------------------------------------------------  2D measurements Normal Doppler Normal Left ventricle measurements LVID ED, 48 mm 43-52 Main pulmonary chord, artery PLAX Pressure, S 44 mm =30 LVID ES, 35 mm 23-38 Hg chord, Left ventricle PLAX Ea, lat 7.02 cm/ ------- FS, chord, 27 % >  29 ann, tiss s PLAX DP LVPW, ED 12 mm ------ E/Ea, lat 16.24 ------- IVS/LVPW 0.92 <1.3 ann, tiss ratio, ED DP Ventricular septum Ea, med 4.68 cm/ ------- IVS, ED 11 mm ------ ann, tiss s Aorta DP Root diam, 26 mm ------ E/Ea, med 24.36 ------- ED ann, tiss Left atrium DP AP dim 42 mm ------ Mitral valve AP dim 2.32 cm/m^2 <2.2 Peak E vel 114 cm/ ------- index s Peak A vel 93.3 cm/ ------- s Deceleratio 303 ms 150-230 n time Peak 5 mm ------- gradient, D Hg Peak E/A 1.2 ------- ratio Tricuspid valve Regurg peak 293 cm/ ------- vel s Peak RV-RA 34 mm ------- gradient, S Hg Systemic veins Estimated 10 mm ------- CVP Hg Right ventricle Pressure, S 44 mm <30 Hg  ------------------------------------------------------------ Prepared and Electronically Authenticated by  Charlton Haws 2013-03-20T14:[id number]  CT ABD PELVIS IMPRESSION:  1. No acute abnormalities seen within the abdomen or pelvis.  2. Scattered diverticulosis noted along the sigmoid colon, without  evidence of diverticulitis.  3. Chronic obstruction of the lower pole moiety of the left  kidney,  with a large extrarenal pelvis; mild atrophy of the right  kidney. Scattered small bilateral renal cysts seen.  4. Diffuse calcification along the abdominal aorta and its  branches.  5. Mild degenerative change along the lumbar spine.  Original Report Authenticated By: Tonia Ghent, M.D.  MRI LUMBAR SPINE WITHOUT CONTRAST  Technique: Multiplanar and multiecho pulse sequences of the lumbar  spine were obtained without intravenous contrast.  Comparison: Abdominal pelvic CT same date. Lumbar myelogram CT  03/03/2006.  Findings: There are five lumbar type vertebral bodies demonstrating  a moderate convex left scoliosis. There is 6 mm of anterolisthesis  at L5-S1 due to advanced bilateral facet disease. The alignment is  otherwise normal. There is no evidence of acute fracture or pars  defect. Endplate degenerative changes at L1-L2 are asymmetric to  the right.  The conus medullaris extends to the L1-L2 level and appears normal.  There are no paraspinal abnormalities. There is chronic atrophy of  the erector spinae musculature. There is chronic hydronephrosis  involving the lower pole moiety of the left kidney.  L1-L2: Asymmetric disc bulging and osteophytes on the right  contribute to progressive right foraminal stenosis and possible  right L1 nerve root encroachment. Mild narrowing of the right  lateral recess appears unchanged.  L2-L3: Disc bulging osteophytes are asymmetric to the right at  this level as well. There is a small posterolateral disc  protrusion on the right which is partially calcified. This  contributes to right lateral recess stenosis and possible right L3  nerve root encroachment. The foramina appear sufficiently patent.  L3-L4: Annular disc bulging, facet and ligamentous hypertrophy  contribute to mild central, lateral recess and foraminal stenosis  bilaterally.  L4-L5: There is annular disc bulging with asymmetric facet  hypertrophy on the left. There is mild  resulting central and  lateral recess stenosis bilaterally. The foramina appear  sufficiently patent.  L5-S1: There is asymmetric facet hypertrophy on the left  accounting for the anterolisthesis. There is only minimal disc  bulging. The left foramen is mildly narrowed. The lateral recesses  appear widely patent. There is no nerve root encroachment.  IMPRESSION:  1. No acute findings or left-sided nerve root compression  identified.  2. There is slightly progressive spondylosis since 2007 associated  with a moderate convex left scoliosis. There is mild resulting  central and lateral recess stenosis at L3-L4 and L4-L5. Foraminal  stenosis appears mild, greatest on the right at L1-L2.  3. Chronic hydronephrosis of the lower pole moiety of the left  kidney.  Original Report Authenticated By: [name], M.D.       Hospital Course See H&P, Labs, Consult and Test reports for all details. In brief, this patient was admitted for HPI: 78 yo female with hx of hypertension, hyperlipidemia, degenerative disk disease, has complaints of lower abdominal pain bilaterally starting on the left this past Sunday evening, it was sharp, fairly constant, with intermittent worseing of the pain. Pt was seen in ER and prescribed pain medication and sent home. Yesterday evening she again had pain, severe and some lower back pain. Pt denies fever, chills, n/v, diarrhea, constipation, brbpr, black stool, dysuria, hematuria. Muscle relaxer seemed to help. Pt notes that she had remote colonscopy that she believes was negative.  In ED today, pt had CT scan of the abd/pelvis that showed diverticulosis but no diverticulitis. Lipase was negative. No evidence of hernia on CT scan. Pt was admitted for intractable back/ lower abdominal pain.   Pt was admitted to the medicine service and had a CT scan as above and MRI scan as above revealing DDD with bulging discs in the lumbar spine.  No herniations or severe nerve  impingement noted (please see full report).  The patient was also found to be constipated and required multiple laxatives and fleets enema with very good results.  Pt was seen by physical therapy and was assessed and they recommended home health PT / OT.  We have arranged for that for patient.  Pt was monitored for several days.  Initially she was hypotensive and required fluid boluses which improved her BPs.  Her home BP meds were resumed and BP was followed.  She had some bradycardia (asymptomatic) HR in 50s thought to be related to her beta blocker therapy.  This has been totally asymptomatic to patient.  I have recommended that she follow up with her cardiologist in the next 3-5 days to be reassessed.  I have also recommended she follow up with her back doctor to discuss treatment options regarding her bulging discs.  She has had spine injections with steroids in the past which seem to help.   Prior to discharge pt has remained stable and ambulating well enough to go home.  I am providing pain medications for her to use as needed for exacerbations of back pain and abdominal pain.  She has had left lower quadrant abdominal pain and groin pain.  Her left hip was xrayed and found to have some degenerative changes and her CT abdomen revealed some diverticulosis (sigmoid) but no active diverticulitis.  Pt has been eating and drinking well during this hospitalization,  No nausea, vomiting or diarrhea.     Active Hospital Problems  Diagnoses Date Noted   . Back pain 01/31/2012   . Diastolic CHF, acute on chronic 01/31/2012   . Abdominal pain 12/19/2011   . HYPERLIPIDEMIA-MIXED 12/15/2008   . HYPERTENSION, BENIGN 12/15/2008     Resolved Hospital Problems  Diagnoses Date Noted Date Resolved  . Hypotension 01/31/2012 02/03/2012  . Constipation 01/31/2012 02/03/2012     Today's Assessment:   Subjective:   [name]  Pt sitting up in bed, comfortable, no distress, alert and oriented, asking to  go home. Pt ate breakfast with no problems.  Pt had some abdominal pain last night but it resolved with pain medications.  Not currently present.  Pt says she is having  gas pain and passing flatus.    Objective:   Blood pressure 99/61, pulse 54, temperature 98.1 F (36.7 C), temperature source Oral, resp. rate 20, height 4\' 11"  (1.499 m), weight 77.202 kg (170 lb 3.2 oz), SpO2 94.00%.  Intake/Output Summary (Last 24 hours) at 02/03/12 1051 Last data filed at 02/03/12 0900  Gross per 24 hour  Intake   1740 ml  Output    300 ml  Net   1440 ml    Exam General - awake, alert, NAD HEENT - MMM Lungs - BBS CTA CV - normal s1, s2 sounds ABD - soft, no masses, mild LLQ tenderness with deep palpation, no guarding or rebound tenderness, normal BS Ext - no cyanosis  Neuro - awake, alert and oriented x 4    Lab Results  Component Value Date   WBC 4.7 02/01/2012   HGB 11.4* 02/01/2012   HCT 35.3* 02/01/2012   PLT 121* 02/01/2012   LYMPHOPCT 30 02/01/2012   MONOPCT 8 02/01/2012   EOSPCT 4 02/01/2012   BASOPCT 0 02/01/2012   CMP: Lab Results  Component Value Date   NA 139 02/02/2012   K 4.0 02/02/2012   CL 108 02/02/2012   CO2 28 02/02/2012   BUN 17 02/02/2012   CREATININE 0.85 02/02/2012   PROT 5.9* 02/01/2012   ALBUMIN 2.9* 02/01/2012   BILITOT 0.4 02/01/2012   ALKPHOS 69 02/01/2012   AST 20 02/01/2012   ALT 19 02/01/2012  .  Discharge Instructions     Please see discharge AVS form.   DISCHARGE MEDICATION: Medication List  As of 02/03/2012 10:51 AM   STOP taking these medications         HYDROcodone-acetaminophen 5-500 MG per tablet         TAKE these medications         aspirin 81 MG chewable tablet   Chew 81 mg by mouth daily.      carvedilol 3.125 MG tablet   Commonly known as: COREG   Take 1 tablet (3.125 mg total) by mouth 2 (two) times daily with a meal.      HYDROcodone-acetaminophen 5-325 MG per tablet   Commonly known as: NORCO   Take 1-2 tablets by mouth every 6  (six) hours as needed for pain.      lactulose 10 GM/15ML solution   Commonly known as: CHRONULAC   Take 30 mLs (20 g total) by mouth 2 (two) times daily.      levothyroxine 75 MCG tablet   Commonly known as: SYNTHROID, LEVOTHROID   Take 1 tablet (75 mcg total) by mouth daily.      nitroGLYCERIN 0.4 MG SL tablet   Commonly known as: NITROSTAT   Place 0.4 mg under the tongue every 5 (five) minutes as needed. For chest pain      quinapril 40 MG tablet   Commonly known as: ACCUPRIL   Take 40 mg by mouth daily.      simvastatin 20 MG tablet   Commonly known as: ZOCOR   Take 1 tablet (20 mg total) by mouth at bedtime.            Disposition and Follow-up:  Discharge Orders    Future Appointments: Provider: Department: Dept Phone: Center:   02/15/2012 2:45 PM Madelin Headings, MD [facility] [phone] [facility]   02/21/2012 10:00 AM [facility] Pv 2 [facility]  None     Future Orders Please Complete By Expires   Diet - low sodium heart healthy  Scheduling Instructions:   HIGHER FIBER DIET RECOMMENDED   Increase activity slowly      Discharge instructions      Comments:   PLEASE FOLLOW UP WITH YOUR BACK SPECIALIST DOCTOR AS SOON AS POSSIBLE SEE YOUR PRIMARY CARE PHYSICIAN IN 1-2 WEEKS SEE YOUR CARDIOLOGIST IN 1 WEEK FOR FOLLOW UP RETURN IF SYMPTOMS RECUR, WORSEN OR NEW PROBLEMS DEVELOP.    Call MD for:  persistant nausea and vomiting      Call MD for:  severe uncontrolled pain      Call MD for:  difficulty breathing, headache or visual disturbances      Call MD for:  persistant dizziness or light-headedness        Follow-up Information    Follow up with Emeterio Reeve, MD in 10 days.      Follow up with [name], MD in 1 week.   Contact information:   [address] [place] [address] [phone]       Follow up with [facility] in 5 days.     Make appt with [facility] in 2 weeks      The risks,  benefits, and possible side effects of all treatments and tests were explained to the patient.  The patient verbalized understanding.  The importance of close follow up with the primary care medical provider was explained clearly to the patient.  The patient verbalized understanding.  The patient was given instructions to return if symptoms recur, worsen or new changes develop.  The patient verbalized understanding.   Cleora Fleet, MD, CDE, FAAFP [facility] [place], [place]   Total Time spent reviewing critical document, reviewing this patient's comprehensive hospitalization, arranging follow up and coordination of care, reviewing data and todays exam greater than 45 minutes.   Signed: Ema Hebner Laural Benes 02/03/2012 10:51 AM

## 2012-02-03 NOTE — Plan of Care (Signed)
Problem: Food- and Nutrition-Related Knowledge Deficit (NB-1.1) Goal: Nutrition education Formal process to instruct or train a patient/client in a skill or to impart knowledge to help patients/clients voluntarily manage or modify food choices and eating behavior to maintain or improve health.  Outcome: Adequate for Discharge Discussed high fiber nutrition therapy with pt.  Pt reports she typically has 2 BMs per day, however 2 days PTA she stopped going to the bathroom completely.  Pt started vicodin for back pain relief.  Narcotics may be contributing to pt's constipation.  Encouraged high-fiber foods.  Provided with handout listing recommended foods. Also encouraged fluid intake.  Pt verbalizes understanding.  RD contact information provided.  Expect food compliance as pt demonstrate understanding, asks about current foods she typically consumes, and does her own shopping.

## 2012-02-03 NOTE — Discharge Instructions (Signed)
DO NOT DRIVE OR OPERATE MOTOR VEHICLE UNDER INFLUENCE OF PAIN MEDICATIONS PLEASE PARTICIPATE IN HOME PHYSICAL THERAPY AND OCCUPATIONAL THERAPY   Abdominal Pain (Nonspecific) Your exam might not show the exact reason you have abdominal pain. Since there are many different causes of abdominal pain, another checkup and more tests may be needed. It is very important to follow up for lasting (persistent) or worsening symptoms. A possible cause of abdominal pain in any person who still has his or her appendix is acute appendicitis. Appendicitis is often hard to diagnose. Normal blood tests, urine tests, ultrasound, and CT scans do not completely rule out early appendicitis or other causes of abdominal pain. Sometimes, only the changes that happen over time will allow appendicitis and other causes of abdominal pain to be determined. Other potential problems that may require surgery may also take time to become more apparent. Because of this, it is important that you follow all of the instructions below. HOME CARE INSTRUCTIONS   Rest as much as possible.   Do not eat solid food until your pain is gone.   While adults or children have pain: A diet of water, weak decaffeinated tea, broth or bouillon, gelatin, oral rehydration solutions (ORS), frozen ice pops, or ice chips may be helpful.   When pain is gone in adults or children: Start a light diet (dry toast, crackers, applesauce, or white rice). Increase the diet slowly as long as it does not bother you. Eat no dairy products (including cheese and eggs) and no spicy, fatty, fried, or high-fiber foods.   Use no alcohol, caffeine, or cigarettes.   Take your regular medicines unless your caregiver told you not to.   Take any prescribed medicine as directed.   Only take over-the-counter or prescription medicines for pain, discomfort, or fever as directed by your caregiver. Do not give aspirin to children.  If your caregiver has given you a follow-up  appointment, it is very important to keep that appointment. Not keeping the appointment could result in a permanent injury and/or lasting (chronic) pain and/or disability. If there is any problem keeping the appointment, you must call to reschedule.  SEEK IMMEDIATE MEDICAL CARE IF:   Your pain is not gone in 24 hours.   Your pain becomes worse, changes location, or feels different.   You or your child has an oral temperature above 102 F (38.9 C), not controlled by medicine.   Your baby is older than 3 months with a rectal temperature of 102 F (38.9 C) or higher.   Your baby is 34 months old or younger with a rectal temperature of 100.4 F (38 C) or higher.   You have shaking chills.   You keep throwing up (vomiting) or cannot drink liquids.   There is blood in your vomit or you see blood in your bowel movements.   Your bowel movements become dark or black.   You have frequent bowel movements.   Your bowel movements stop (become blocked) or you cannot pass gas.   You have bloody, frequent, or painful urination.   You have yellow discoloration in the skin or whites of the eyes.   Your stomach becomes bloated or bigger.   You have dizziness or fainting.   You have chest or back pain.  MAKE SURE YOU:   Understand these instructions.   Will watch your condition.   Will get help right away if you are not doing well or get worse.  Document Released: 10/31/2005 Document Revised:  10/20/2011 Document Reviewed: 09/28/2009 [employer] Patient Information 2012 [place], [place].  Degenerative Disc Disease Degenerative disc disease is a condition caused by the changes that occur in the cushions of the backbone (spinal discs) as you grow older. Spinal discs are soft and compressible discs located between the bones of the spine (vertebrae). They act like shock absorbers. Degenerative disc disease can affect the wholespine. However, the neck and lower back are most commonly affected. Many  changes can occur in the spinal discs with aging, such as:  The spinal discs may dry and shrink.   Small tears may occur in the tough, outer covering of the disc (annulus).   The disc space may become smaller due to loss of water.   Abnormal growths in the bone (spurs) may occur. This can put pressure on the nerve roots exiting the spinal canal, causing pain.   The spinal canal may become narrowed.  CAUSES  Degenerative disc disease is a condition caused by the changes that occur in the spinal discs with aging. The exact cause is not known, but there is a genetic basis for many patients. Degenerative changes can occur due to loss of fluid in the disc. This makes the disc thinner and reduces the space between the backbones. Small cracks can develop in the outer layer of the disc. This can lead to the breakdown of the disc. You are more likely to get degenerative disc disease if you are overweight. Smoking cigarettes and doing heavy work such as weightlifting can also increase your risk of this condition. Degenerative changes can start after a sudden injury. Growth of bone spurs can compress the nerve roots and cause pain.  SYMPTOMS  The symptoms vary from person to person. Some people may have no pain, while others have severe pain. The pain may be so severe that it can limit your activities. The location of the pain depends on the part of your backbone that is affected. You will have neck or arm pain if a disc in the neck area is affected. You will have pain in your back, buttocks, or legs if a disc in the lower back is affected. The pain becomes worse while bending, reaching up, or with twisting movements. The pain may start gradually and then get worse as time passes. It may also start after a major or minor injury. You may feel numbness or tingling in the arms or legs.  DIAGNOSIS  Your caregiver will ask you about your symptoms and about activities or habits that may cause the pain. He or she may  also ask about any injuries, diseases, ortreatments you have had earlier. Your caregiver will examine you to check for the range of movement that is possible in the affected area, to check for strength in your extremities, and to check for sensation in the areas of the arms and legs supplied by different nerve roots. An X-ray of the spine may be taken. Your caregiver may suggest other imaging tests, such as a computerized magnetic scan (MRI), if needed.  TREATMENT  Treatment includes rest, modifying your activities, and applying ice and heat. Your caregiver may prescribe medicines to reduce your pain and may ask you to do some exercises to strengthen your back. In some cases, you may need surgery. You and your caregiver will decide on the treatment that is best for you. HOME CARE INSTRUCTIONS   Follow proper lifting and walking techniques as advised by your caregiver.   Maintain good posture.   Exercise regularly  as advised.   Perform relaxation exercises.   Change your sitting, standing, and sleeping habits as advised. Change positions frequently.   Lose weight as advised.   Stop smoking if you smoke.   Wear supportive footwear.  SEEK MEDICAL CARE IF:  The pain does not go away within 1 to 4 weeks. SEEK IMMEDIATE MEDICAL CARE IF:   The pain is severe.   You notice weakness in your arms, hands, or legs.   You begin to lose control of your bladder or bowel.  MAKE SURE YOU:   Understand these instructions.   Will watch your condition.   Will get help right away if you are not doing well or get worse.  Document Released: 08/28/2007 Document Revised: 10/20/2011 Document Reviewed: 08/28/2007 [facility] Patient Information 2012 [place], [place].  Back Pain, Adult Low back pain is very common. About 1 in 5 people have back pain.The cause of low back pain is rarely dangerous. The pain often gets better over time.About half of people with a sudden onset of back pain feel better in just  2 weeks. About 8 in 10 people feel better by 6 weeks.  CAUSES Some common causes of back pain include:  Strain of the muscles or ligaments supporting the spine.   Wear and tear (degeneration) of the spinal discs.   Arthritis.   Direct injury to the back.  DIAGNOSIS Most of the time, the direct cause of low back pain is not known.However, back pain can be treated effectively even when the exact cause of the pain is unknown.Answering your caregiver's questions about your overall health and symptoms is one of the most accurate ways to make sure the cause of your pain is not dangerous. If your caregiver needs more information, he or she may order lab work or imaging tests (X-rays or MRIs).However, even if imaging tests show changes in your back, this usually does not require surgery. HOME CARE INSTRUCTIONS For many people, back pain returns.Since low back pain is rarely dangerous, it is often a condition that people can learn to [facility] their own.   Remain active. It is stressful on the back to sit or stand in one place. Do not sit, drive, or stand in one place for more than 30 minutes at a time. Take short walks on level surfaces as soon as pain allows.Try to increase the length of time you walk each day.   Do not stay in bed.Resting more than 1 or 2 days can delay your recovery.   Do not avoid exercise or work.Your body is made to move.It is not dangerous to be active, even though your back may hurt.Your back will likely heal faster if you return to being active before your pain is gone.   Pay attention to your body when you bend and lift. Many people have less discomfortwhen lifting if they bend their knees, keep the load close to their bodies,and avoid twisting. Often, the most comfortable positions are those that put less stress on your recovering back.   Find a comfortable position to sleep. Use a firm mattress and lie on your side with your knees slightly bent. If you lie on  your back, put a pillow under your knees.   Only take over-the-counter or prescription medicines as directed by your caregiver. Over-the-counter medicines to reduce pain and inflammation are often the most helpful.Your caregiver may prescribe muscle relaxant drugs.These medicines help dull your pain so you can more quickly return to your normal activities and healthy  exercise.   Put ice on the injured area.   Put ice in a plastic bag.   Place a towel between your skin and the bag.   Leave the ice on for 15 to 20 minutes, 3 to 4 times a day for the first 2 to 3 days. After that, ice and heat may be alternated to reduce pain and spasms.   Ask your caregiver about trying back exercises and gentle massage. This may be of some benefit.   Avoid feeling anxious or stressed.Stress increases muscle tension and can worsen back pain.It is important to recognize when you are anxious or stressed and learn ways to manage it.Exercise is a great option.  SEEK MEDICAL CARE IF:  You have pain that is not relieved with rest or medicine.   You have pain that does not improve in 1 week.   You have new symptoms.   You are generally not feeling well.  SEEK IMMEDIATE MEDICAL CARE IF:   You have pain that radiates from your back into your legs.   You develop new bowel or bladder control problems.   You have unusual weakness or numbness in your arms or legs.   You develop nausea or vomiting.   You develop abdominal pain.   You feel faint.  Document Released: 10/31/2005 Document Revised: 10/20/2011 Document Reviewed: 03/21/2011 [facility] Patient Information 2012 [place], [place].  Constipation in Adults Constipation is having fewer than 2 bowel movements per week. Usually, the stools are hard. As we grow older, constipation is more common. If you try to fix constipation with laxatives, the problem may get worse. This is because laxatives taken over a long period of time make the colon muscles  weaker. A low-fiber diet, not taking in enough fluids, and taking some medicines may make these problems worse. MEDICATIONS THAT MAY CAUSE CONSTIPATION  Water pills (diuretics).   Calcium channel blockers (used to control blood pressure and for the heart).   Certain pain medicines (narcotics).   Anticholinergics.   Anti-inflammatory agents.   Antacids that contain aluminum.  DISEASES THAT CONTRIBUTE TO CONSTIPATION  Diabetes.   Parkinson's disease.   Dementia.   Stroke.   Depression.   Illnesses that cause problems with salt and water metabolism.  HOME CARE INSTRUCTIONS   Constipation is usually best cared for without medicines. Increasing dietary fiber and eating more fruits and vegetables is the best way to manage constipation.   Slowly increase fiber intake to 25 to 38 grams per day. Whole grains, fruits, vegetables, and legumes are good sources of fiber. A dietitian can further help you incorporate high-fiber foods into your diet.   Drink enough water and fluids to keep your urine clear or pale yellow.   A fiber supplement may be added to your diet if you cannot get enough fiber from foods.   Increasing your activities also helps improve regularity.   Suppositories, as suggested by your caregiver, will also help. If you are using antacids, such as aluminum or calcium containing products, it will be helpful to switch to products containing magnesium if your caregiver says it is okay.   If you have been given a liquid injection (enema) today, this is only a temporary measure. It should not be relied on for treatment of longstanding (chronic) constipation.   Stronger measures, such as magnesium sulfate, should be avoided if possible. This may cause uncontrollable diarrhea. Using magnesium sulfate may not allow you time to make it to the bathroom.  SEEK IMMEDIATE MEDICAL  CARE IF:   There is bright red blood in the stool.   The constipation stays for more than 4 days.     There is belly (abdominal) or rectal pain.   You do not seem to be getting better.   You have any questions or concerns.  MAKE SURE YOU:   Understand these instructions.   Will watch your condition.   Will get help right away if you are not doing well or get worse.  Document Released: 07/29/2004 Document Revised: 10/20/2011 Document Reviewed: 10/04/2011 [facility] Patient Information 2012 [place], [place].

## 2012-02-15 ENCOUNTER — Ambulatory Visit (INDEPENDENT_AMBULATORY_CARE_PROVIDER_SITE_OTHER): Payer: Medicare Other | Admitting: Internal Medicine

## 2012-02-15 ENCOUNTER — Encounter: Payer: Self-pay | Admitting: Internal Medicine

## 2012-02-15 VITALS — BP 136/60 | HR 103 | Temp 98.7°F | Ht 59.5 in | Wt 157.0 lb

## 2012-02-15 DIAGNOSIS — I6529 Occlusion and stenosis of unspecified carotid artery: Secondary | ICD-10-CM

## 2012-02-15 DIAGNOSIS — I10 Essential (primary) hypertension: Secondary | ICD-10-CM

## 2012-02-15 DIAGNOSIS — E785 Hyperlipidemia, unspecified: Secondary | ICD-10-CM

## 2012-02-15 DIAGNOSIS — I509 Heart failure, unspecified: Secondary | ICD-10-CM

## 2012-02-15 DIAGNOSIS — M549 Dorsalgia, unspecified: Secondary | ICD-10-CM

## 2012-02-15 DIAGNOSIS — M5135 Other intervertebral disc degeneration, thoracolumbar region: Secondary | ICD-10-CM

## 2012-02-15 DIAGNOSIS — R21 Rash and other nonspecific skin eruption: Secondary | ICD-10-CM

## 2012-02-15 DIAGNOSIS — IMO0002 Reserved for concepts with insufficient information to code with codable children: Secondary | ICD-10-CM

## 2012-02-15 DIAGNOSIS — R109 Unspecified abdominal pain: Secondary | ICD-10-CM

## 2012-02-15 DIAGNOSIS — I5033 Acute on chronic diastolic (congestive) heart failure: Secondary | ICD-10-CM

## 2012-02-15 DIAGNOSIS — Z85828 Personal history of other malignant neoplasm of skin: Secondary | ICD-10-CM

## 2012-02-15 NOTE — Patient Instructions (Signed)
I am not sure why you have the pain  Agree with seeing Dr Darrelyn Hillock.  Intrigued but the rash on the left buttocks area  ? If this could have been shingles with radiation.  Will have to review your record that I have and then plan follow  Up  . Either way plan recheck   1 month or so.

## 2012-02-15 NOTE — Progress Notes (Signed)
Subjective:    Patient ID: [name], female    DOB: 1925-04-19, 78 y.o.   MRN: [id number]  HPI Patient comes in as new patient visit . Previous care was  [facility].  She also sees Dr. [name] cardiology for skipped beats and cardiovascular disease.   She had a primary care doctor but was told to get a doctor in [place].   She was recently hospitalized with abdominal pain of unclear etiology. She presented 2 times to the emergency room and was finally admitted by the hospitalists. She describes severe pain in the lower abdomen more on the left than the right and somewhat to the back area. She had CT scans of her abdomen and spine films laboratory studies done. She was told she could have had constipation causing her symptoms. Was given narcotic medications in hospital at discharge she was seen by [facility] physicians a day later and told to go on any stool softeners or some type of laxative possibly MiraLax to help with her stool. Has been seen at [facility]  Last week.   And was discharged the next day. And given stool medication  2 weeks ago and was given pain killers  As she was given pain kjiller and pain meds. 3 21  Never had cards consult   She is scheduled to see  see Dr Darrelyn Hillock on April 10th. In case it is from her back  She is also to follow up with Dr. [name].  At this point in time she states that her pain is about a 4/10 and since her hospitalization she has had to walk and steady with a cane. She's also had a rash that has been on her left buttocks area. Before all this started for months she is described in sticking kind of pain in her left hip area. No obvious radiation down her legs. She denies a fall or specific injury.   Review of Systems ROS:  GEN/ HEENT: No fever, significant weight changes sweats headaches vision problems  Has cataracts hearing changes, CV/ PULM; No chest pain shortness of breath cough, syncope,edema  change in exercise tolerance. GI /GU: No  vomiting, . No blood in  the stool. No significant GU symptoms. SKIN/HEME: ,no a suspicious lesions or bleeding. No lymphadenopathy, nodules, masses.  NEURO/ PSYCH:  No neurologic signs such as weakness numbness. No depression anxiety. Patient does relate mental fogginess since hospitalization. IMM/ Allergy: No unusual infections.  Allergy .   REST of 12 system review negative except as per HPI  Outpatient Prescriptions Prior to Visit  Medication Sig Dispense Refill  . aspirin 81 MG chewable tablet Chew 81 mg by mouth daily.      . carvedilol (COREG) 3.125 MG tablet Take 1 tablet (3.125 mg total) by mouth 2 (two) times daily with a meal.  90 tablet  11  . levothyroxine (SYNTHROID, LEVOTHROID) 75 MCG tablet Take 1 tablet (75 mcg total) by mouth daily.  90 tablet  11  . nitroGLYCERIN (NITROSTAT) 0.4 MG SL tablet Place 0.4 mg under the tongue every 5 (five) minutes as needed. For chest pain      . quinapril (ACCUPRIL) 40 MG tablet Take 40 mg by mouth daily.      . simvastatin (ZOCOR) 20 MG tablet Take 1 tablet (20 mg total) by mouth at bedtime.  90 tablet  11  . aspirin 81 MG tablet Take 81 mg by mouth daily.      . quinapril (ACCUPRIL) 40 MG tablet Take 1  tablet (40 mg total) by mouth daily.  90 tablet  3   Past history family history social history entered and  reviewed in the electronic medical record.      Objective:   Physical Exam Weight: 157 lb (71.215 kg)  BP 136/60  Pulse 103  Temp 98.7 F (37.1 C)  Ht 4' 11.5" (1.511 m)  Wt 157 lb (71.215 kg)  BMI 31.18 kg/m2  SpO2 97% Well-developed alert cognitively intact elderly female in no acute distress. She walks independently but better with a cane a little bit bent forward. HEENT: Normocephalic ;atraumatic , Eyes;  PERRL, EOMs  Full, lids and conjunctiva clear,,Ears: no deformities, canals nl, TM landmarks normal, Nose: no deformity or discharge  Mouth : OP clear without lesion or edema .  Teeth in need of repair Neck: Supple without adenopathy or masses or  bruits Chest:  Clear to A&P without wheezes rales or rhonchi CV:  S1-S2 no gallops or murmurs peripheral perfusion is normal Abdomen:  Sof,t normal bowel sounds without hepatosplenomegaly, no guarding rebound or masses no CVA tenderness she points to the lower left abdomen groin area as area of concern there is no obvious hernia  range of motion of the hip no psoas sign SKIN ; fading scant scab-type rash on the left buttocks but does not go past midline and appears to be involved in a dermatomal distribution going to the left lateral hip. None on the abdomen. Musculoskeletal no bony tenderness though a changes gait antalgic.    Unsure if med list is complete. Patient did not bring in her bottles today     Assessment & Plan:  New patient   Hx of recent abdominal pain with hospital   admission   uncertain etiology   Poss radiating to back . She was noted to have hypotension at some point and given gentle IV fluids low blood pressure was felt to possibly be from narcotics that were given. Patient was told her pain had been from constipation brought on by hypothyroid in general this seems a bit far patch for the severity of pain that she describes. She states that she does not have a history of chronic constipation.   History of hypertension PVCs degenerative disease CHF hyperlipidemia hypothyroidism  BNP  was 800 s  in the hospital   Previous primary care notes are not available to this physician or her followup from hospital.  Reviewed hospital records .  Fading rash  Left buttocks are ? If could be shingles.  Versus other cause.  Certainly agree keeping her appointment with orthopedics.  We will plan a follow up visit in about a month to try to put things together collect information as appropriate.

## 2012-02-19 ENCOUNTER — Encounter: Payer: Self-pay | Admitting: Internal Medicine

## 2012-02-19 DIAGNOSIS — R21 Rash and other nonspecific skin eruption: Secondary | ICD-10-CM | POA: Insufficient documentation

## 2012-02-19 DIAGNOSIS — R109 Unspecified abdominal pain: Secondary | ICD-10-CM | POA: Insufficient documentation

## 2012-02-19 DIAGNOSIS — Z85828 Personal history of other malignant neoplasm of skin: Secondary | ICD-10-CM | POA: Insufficient documentation

## 2012-02-19 DIAGNOSIS — E039 Hypothyroidism, unspecified: Secondary | ICD-10-CM | POA: Insufficient documentation

## 2012-03-01 ENCOUNTER — Encounter (INDEPENDENT_AMBULATORY_CARE_PROVIDER_SITE_OTHER): Payer: Medicare Other

## 2012-03-01 DIAGNOSIS — I6529 Occlusion and stenosis of unspecified carotid artery: Secondary | ICD-10-CM

## 2012-03-07 ENCOUNTER — Telehealth: Payer: Self-pay | Admitting: Internal Medicine

## 2012-03-07 NOTE — Telephone Encounter (Signed)
Fu call °Pt returning your call  °

## 2012-03-07 NOTE — Telephone Encounter (Signed)
Pt called with results

## 2012-03-16 ENCOUNTER — Ambulatory Visit (INDEPENDENT_AMBULATORY_CARE_PROVIDER_SITE_OTHER): Payer: Medicare Other | Admitting: Internal Medicine

## 2012-03-16 ENCOUNTER — Encounter: Payer: Self-pay | Admitting: Internal Medicine

## 2012-03-16 ENCOUNTER — Other Ambulatory Visit: Payer: Self-pay | Admitting: Internal Medicine

## 2012-03-16 VITALS — BP 102/68 | HR 77 | Temp 98.2°F | Wt 156.0 lb

## 2012-03-16 DIAGNOSIS — R209 Unspecified disturbances of skin sensation: Secondary | ICD-10-CM

## 2012-03-16 DIAGNOSIS — E8809 Other disorders of plasma-protein metabolism, not elsewhere classified: Secondary | ICD-10-CM

## 2012-03-16 DIAGNOSIS — R7309 Other abnormal glucose: Secondary | ICD-10-CM

## 2012-03-16 DIAGNOSIS — I509 Heart failure, unspecified: Secondary | ICD-10-CM

## 2012-03-16 DIAGNOSIS — I10 Essential (primary) hypertension: Secondary | ICD-10-CM

## 2012-03-16 DIAGNOSIS — I5033 Acute on chronic diastolic (congestive) heart failure: Secondary | ICD-10-CM

## 2012-03-16 DIAGNOSIS — D649 Anemia, unspecified: Secondary | ICD-10-CM

## 2012-03-16 DIAGNOSIS — R739 Hyperglycemia, unspecified: Secondary | ICD-10-CM

## 2012-03-16 DIAGNOSIS — M5126 Other intervertebral disc displacement, lumbar region: Secondary | ICD-10-CM

## 2012-03-16 LAB — CBC WITH DIFFERENTIAL/PLATELET
Basophils Absolute: 0.1 10*3/uL (ref 0.0–0.1)
Eosinophils Absolute: 0.2 10*3/uL (ref 0.0–0.7)
Lymphocytes Relative: 21.5 % (ref 12.0–46.0)
MCHC: 33.4 g/dL (ref 30.0–36.0)
Monocytes Relative: 7.6 % (ref 3.0–12.0)
Neutro Abs: 3.8 10*3/uL (ref 1.4–7.7)
Neutrophils Relative %: 66.9 % (ref 43.0–77.0)
Platelets: 138 10*3/uL — ABNORMAL LOW (ref 150.0–400.0)
RDW: 13.9 % (ref 11.5–14.6)

## 2012-03-16 LAB — BASIC METABOLIC PANEL
BUN: 19 mg/dL (ref 6–23)
CO2: 25 mEq/L (ref 19–32)
Calcium: 9.8 mg/dL (ref 8.4–10.5)
Glucose, Bld: 101 mg/dL — ABNORMAL HIGH (ref 70–99)
Sodium: 140 mEq/L (ref 135–145)

## 2012-03-16 LAB — IBC PANEL
Iron: 93 ug/dL (ref 42–145)
Saturation Ratios: 23.5 % (ref 20.0–50.0)
Transferrin: 283 mg/dL (ref 212.0–360.0)

## 2012-03-16 NOTE — Progress Notes (Signed)
  Subjective:    Patient ID: [name], female    DOB: Oct 09, 1925, 78 y.o.   MRN: [id number]  HPI Patient comes in today for follow up of  multiple medical problems.   See last visit: Left buttock feels like running  Sensation lateral but no more pain and no abd pain  And rash is gone . Saw Dr Magdalene Patricia   Gave rx for cortisone taper.   For her chronic back pain flare  Gi taking prune juice.  Helps constipation.  No falling.  Breathing  Ok No  chest pain . Had carotid check and no change.  Review of Systems Neg chest pain sob cough new rash   Syncope fall bleed new abd pain no fever .  No change vision or hearing  Eats well eats at daughters she prepares food .     Objective:   Physical Exam BP 102/68  Pulse 77  Temp(Src) 98.2 F (36.8 C) (Oral)  Wt 156 lb (70.761 kg)  SpO2 98% wdwn in nad Oriented x 3 and no noted deficits in memory, attention, and speech. HEENT atraumatic  Gait cautious  Bends forward but independent  Has cane not used Neck: Supple without adenopathy or masses or bruits no jvd  Chest:  Clear to A&P without wheezes rales or rhonchi CV:  S1-S2 no gallops or murmurs peripheral perfusion is normal No clubbing cyanosis or edema Skin: normal capillary refill ,turgor , color: No acute rashes ,petechiae or bruising  Reviewed labs from ed area and last notes     Assessment & Plan:   Anemia ? cause  No hx of bleeding but on asa  No gi sx but had constipation ad rx with prune juice .  Low albumin  Last ua neg protein  No edema  Looks good today Back pain chronic degenerative disease History of abdominal pain could be an atypical neuritis shingles etc. This part is better and she is taking produced help with constipation. Will follow no further workup at this time. residual left skin sensation change  Left thigh borderline elevated BG   Carotid  Disease chf  Stable  Labs today tsh lfts anemia labs ife and urine if a1c  Then plan fu or 4 months   Get zostavax  in 6 months or thereabout,

## 2012-03-16 NOTE — Patient Instructions (Signed)
I am still suspicious that your episode of abdominal pain could have been related to an atypical shingles attack. Either way no further evaluation at this time.  In about 6 months we can get  A shingles vaccine.  We are doing laboratory tests today to check for the mild anemia cause and give you advice. We'll also check her blood sugar situation. We'll plan followup depending on labs or -4 months

## 2012-03-17 DIAGNOSIS — R209 Unspecified disturbances of skin sensation: Secondary | ICD-10-CM | POA: Insufficient documentation

## 2012-03-19 ENCOUNTER — Telehealth: Payer: Self-pay

## 2012-03-19 NOTE — Telephone Encounter (Signed)
Becky from [facility] called and states that a urine specimen was not received on pt.  The only thing received was bloodwork.   Spoke with Laney Potash about pt's urine and he states that pt bought back urine specimen today.  Dr. Fabian Sharp aware.

## 2012-03-20 LAB — PROTEIN / CREATININE RATIO, URINE: Protein Creatinine Ratio: 0.04 (ref <0.15)

## 2012-03-21 LAB — IMMUNOFIXATION ELECTROPHORESIS: IgM, Serum: 252 mg/dL (ref 52–322)

## 2012-03-22 LAB — HEPATIC FUNCTION PANEL
Bilirubin, Direct: 0.1 mg/dL (ref 0.0–0.3)
Total Bilirubin: 0.6 mg/dL (ref 0.3–1.2)

## 2012-03-22 NOTE — Progress Notes (Signed)
Quick Note:  Left a message for pt to return call. ______ 

## 2012-03-23 LAB — UIFE/LIGHT CHAINS/TP QN, 24-HR UR
Albumin, U: DETECTED
Free Kappa/Lambda Ratio: 24 ratio — ABNORMAL HIGH (ref 2.04–10.37)
Free Lambda Excretion/Day: 0.95 mg/d
Total Protein, Urine: 1.3 mg/dL

## 2012-03-23 NOTE — Progress Notes (Signed)
Quick Note:  Spoke with pt and pt is aware of lab results. ______ 

## 2012-03-28 NOTE — Progress Notes (Signed)
Quick Note:  Attempt to call- VM - LMTCB if questions - gave Dr. Rosezella [place] instructions about lab normal. Needs rov in 2 mos - please call to make . Copy of lab sent to home. KIK ______

## 2012-05-16 ENCOUNTER — Encounter: Payer: Self-pay | Admitting: Internal Medicine

## 2012-05-16 ENCOUNTER — Ambulatory Visit (INDEPENDENT_AMBULATORY_CARE_PROVIDER_SITE_OTHER): Payer: Medicare Other | Admitting: Internal Medicine

## 2012-05-16 VITALS — BP 152/54 | HR 61 | Temp 98.5°F | Wt 153.0 lb

## 2012-05-16 DIAGNOSIS — W19XXXA Unspecified fall, initial encounter: Secondary | ICD-10-CM

## 2012-05-16 DIAGNOSIS — R0789 Other chest pain: Secondary | ICD-10-CM

## 2012-05-16 DIAGNOSIS — IMO0002 Reserved for concepts with insufficient information to code with codable children: Secondary | ICD-10-CM

## 2012-05-16 DIAGNOSIS — M5135 Other intervertebral disc degeneration, thoracolumbar region: Secondary | ICD-10-CM

## 2012-05-16 DIAGNOSIS — R071 Chest pain on breathing: Secondary | ICD-10-CM

## 2012-05-16 NOTE — Patient Instructions (Addendum)
I  think this is a  bruised chest wall . Lungs and heart seem ok. This will be sore  For a while a  week or so. But if gets worse and breathing issues   Contact on call service . Tylenol and change in position will help .      Chest Wall Pain Chest wall pain is pain in or around the bones and muscles of your chest. It may take up to 6 weeks to get better. It may take longer if you must stay physically active in your work and activities.  CAUSES  Chest wall pain may happen on its own. However, it may be caused by:  A viral illness like the flu.   Injury.   Coughing.   Exercise.   Arthritis.   Fibromyalgia.   Shingles.  HOME CARE INSTRUCTIONS   Avoid overtiring physical activity. Try not to strain or perform activities that cause pain. This includes any activities using your chest or your abdominal and side muscles, especially if heavy weights are used.   Put ice on the sore area.   Put ice in a plastic bag.   Place a towel between your skin and the bag.   Leave the ice on for 15 to 20 minutes per hour while awake for the first 2 days.   Only take over-the-counter or prescription medicines for pain, discomfort, or fever as directed by your caregiver.  SEEK IMMEDIATE MEDICAL CARE IF:   Your pain increases, or you are very uncomfortable.   You have a fever.   Your chest pain becomes worse.   You have new, unexplained symptoms.   You have nausea or vomiting.   You feel sweaty or lightheaded.   You have a cough with phlegm (sputum), or you cough up blood.  MAKE SURE YOU:   Understand these instructions.   Will watch your condition.   Will get help right away if you are not doing well or get worse.  Document Released: 10/31/2005 Document Revised: 10/20/2011 Document Reviewed: 06/27/2011 [facility] Patient Information 2012 [place], [place].

## 2012-05-16 NOTE — Progress Notes (Signed)
  Subjective:    Patient ID: [name], female    DOB: Oct 14, 1925, 78 y.o.   MRN: [id number]  HPI Patient comes in today for an acute visit. She had an injury in the fall. Dog on a leash was in her hand in her high him and they were evaluating and air are bed furniture the dog lurched and she held onto the leash and tripped over the furniture. And fell right  Chest and shoulder and side  This occurred 2 days ago at 4 30 in the afternoon was fairly okay at that time but down hours later and then began to have pain on the right chest area. and worse the next day.   Now is tolerable and not worse. Supposed to travel ot beach for holiday.  Not steady pain  Worse with bending over.  And motion.  No radiation down the arm no obvious bruising.  Review of Systems NO cough fever.  Deep breath sometimes hurts. No constitutional symptoms has ongoing back pain Dr. Saunders Revel not surgical asks about back brace at times.  Past history family history social history reviewed in the electronic medical record. Outpatient Encounter Prescriptions as of 05/16/2012  Medication Sig Dispense Refill  . aspirin 81 MG chewable tablet Chew 81 mg by mouth daily.      . carvedilol (COREG) 3.125 MG tablet Take 1 tablet (3.125 mg total) by mouth 2 (two) times daily with a meal.  90 tablet  11  . levothyroxine (SYNTHROID, LEVOTHROID) 75 MCG tablet Take 1 tablet (75 mcg total) by mouth daily.  90 tablet  11  . nitroGLYCERIN (NITROSTAT) 0.4 MG SL tablet Place 0.4 mg under the tongue every 5 (five) minutes as needed. For chest pain      . quinapril (ACCUPRIL) 40 MG tablet Take 40 mg by mouth daily.      . simvastatin (ZOCOR) 20 MG tablet Take 1 tablet (20 mg total) by mouth at bedtime.  90 tablet  11       Objective:   Physical Exam BP 152/54  Pulse 61  Temp 98.5 F (36.9 C) (Oral)  Wt 153 lb (69.4 kg)  SpO2 97% WDWN in nad walks  Bent over some from back pain. Able to get up on table with out assistance.    Neck: Supple without adenopathy or masses Chest:  Clear to A&P without wheezes rales or rhonchi CV:  S1-S2 no gallops or murmurs peripheral perfusion is normal, Breast pendulous.    Abdomen:  Sof,t normal bowel sounds without hepatosplenomegaly, no guarding rebound or masses no CVA tenderness Tender right mid cc area nd lat chest wall but no bruising or crepitus of point tenderness.  Oriented x 3 and no noted deficits in memory, attention, and speech. Right arm senile changes  Good rom  No swelling.      Assessment & Plan:   Hx of fall trip  Pet related.   Chest wall soreness and pain seems ms and no ob fx rib although possible  As disc .  X ray at this time will not help therapy.  No alarm features at this time.    Expectant management. Back arthritis problematic   Back braces  Should be rxed by back specialists .

## 2012-05-24 ENCOUNTER — Ambulatory Visit (INDEPENDENT_AMBULATORY_CARE_PROVIDER_SITE_OTHER): Payer: Medicare Other | Admitting: Nurse Practitioner

## 2012-05-24 ENCOUNTER — Ambulatory Visit
Admission: RE | Admit: 2012-05-24 | Discharge: 2012-05-24 | Disposition: A | Discharge status: Home / Self Care | Payer: Medicare Other | Source: Ambulatory Visit | Attending: Nurse Practitioner | Admitting: Nurse Practitioner

## 2012-05-24 ENCOUNTER — Encounter: Payer: Self-pay | Admitting: Nurse Practitioner

## 2012-05-24 ENCOUNTER — Other Ambulatory Visit: Payer: Self-pay | Admitting: Nurse Practitioner

## 2012-05-24 VITALS — BP 116/70 | HR 52 | Ht 59.0 in | Wt 153.8 lb

## 2012-05-24 DIAGNOSIS — R0600 Dyspnea, unspecified: Secondary | ICD-10-CM

## 2012-05-24 DIAGNOSIS — R0989 Other specified symptoms and signs involving the circulatory and respiratory systems: Secondary | ICD-10-CM

## 2012-05-24 DIAGNOSIS — R0609 Other forms of dyspnea: Secondary | ICD-10-CM

## 2012-05-24 DIAGNOSIS — W19XXXA Unspecified fall, initial encounter: Secondary | ICD-10-CM

## 2012-05-24 NOTE — Assessment & Plan Note (Signed)
She has had a fall 2 weeks ago. Still with right sided pain and some DOE. She is concerned. Will check a CXR and rib films. I have suggested Tylenol. We will have her see Dr. [name] at the end of August as planned. Patient is agreeable to this plan and will call if any problems develop in the interim.

## 2012-05-24 NOTE — Progress Notes (Signed)
Sharon Mt Date of Birth: 1925-10-12 Medical Record #[id number]  History of Present Illness: Ms. Peyser is seen today for a work in visit. She is seen for Dr. [name]. She has HTN, HLD, aortic sclerosis and had a normal myoview back in 2011. She comes in today after having a fall two weeks ago. She notes that she tripped over an air mattress while holding on to her dog's leash. She hit the floor. She did not pass out. Ever since, she has had right sided lower chest pain. Worse with deep breathing. Hurts with moving and especially trying to get in and out of bed. Saw her PCP initially and was felt to be ok. Her symptoms have persisted and she wanted further evaluation. Has not had a CXR. Does have some dyspnea in association. No actual chest pain. Not dizzy and has had no passing out spells. She is due to see Dr. [name] for her regular visit at the end of August.   Current Outpatient Prescriptions on File Prior to Visit  Medication Sig Dispense Refill  . aspirin 81 MG chewable tablet Chew 81 mg by mouth daily.      . carvedilol (COREG) 3.125 MG tablet Take 1 tablet (3.125 mg total) by mouth 2 (two) times daily with a meal.  90 tablet  11  . levothyroxine (SYNTHROID, LEVOTHROID) 75 MCG tablet Take 1 tablet (75 mcg total) by mouth daily.  90 tablet  11  . nitroGLYCERIN (NITROSTAT) 0.4 MG SL tablet Place 0.4 mg under the tongue every 5 (five) minutes as needed. For chest pain      . quinapril (ACCUPRIL) 40 MG tablet Take 40 mg by mouth daily.      . simvastatin (ZOCOR) 20 MG tablet Take 1 tablet (20 mg total) by mouth at bedtime.  90 tablet  11    No Known Allergies  Past Medical History  Diagnosis Date  . Dyslipidemia   . Hypertension   . Palpitations     PVCs/bigeminy on event in May 2009 revealing this was relatively asymptomatic  . Chest pain   . Cardiovascular disease     moderate by USN 01/2010  . Degenerative joint disease   . Hx of varicella   . History of skin cancer    tafeen    Past Surgical History  Procedure Date  . Cholecystectomy 1999  . Cesarean section     times 2  . Shoulder surgery 1996    Right   . Cataract extraction     Bilateral  implantt    History  Smoking status  . Never Smoker   Smokeless tobacco  . Never Used    History  Alcohol Use No    Family History  Problem Relation Age of Onset  . Coronary artery disease Father   . Lung cancer      Review of Systems: The review of systems is per the HPI.  All other systems were reviewed and are negative.  Physical Exam: BP 116/70  Pulse 52  Ht 4\' 11"  (1.499 m)  Wt 153 lb 12.8 oz (69.763 kg)  BMI 31.06 kg/m2 Patient is very pleasant and in no acute distress. Skin is warm and dry. Color is normal.  HEENT is unremarkable except for poor dentition. Normocephalic/atraumatic. PERRL. Sclera are nonicteric. Neck is supple. No masses. No JVD. Lungs are clear. Cardiac exam shows a regular rate and rhythm. Heart rate is up to 70 for me. No bruising noted on the right side  and no real tenderness with palpation.  Abdomen is soft. Extremities are without edema. Gait and ROM are intact. No gross neurologic deficits noted.   LABORATORY DATA:   Assessment / Plan:

## 2012-05-24 NOTE — Patient Instructions (Signed)
We are going to send you for a CXR and an xray of your ribs on the right side.  Go to [facility] at the [employer].   We will get you an appointment with Dr. [name] for the end of August  You may use Tylenol if needed.  Call the [facility] office at [phone] if you have any questions, problems or concerns.

## 2012-05-28 ENCOUNTER — Ambulatory Visit: Payer: Medicare Other | Admitting: Internal Medicine

## 2012-06-21 ENCOUNTER — Other Ambulatory Visit: Payer: Self-pay | Admitting: Internal Medicine

## 2012-06-21 DIAGNOSIS — D472 Monoclonal gammopathy: Secondary | ICD-10-CM

## 2012-06-21 DIAGNOSIS — D649 Anemia, unspecified: Secondary | ICD-10-CM

## 2012-06-22 ENCOUNTER — Telehealth: Payer: Self-pay | Admitting: Speech Pathology

## 2012-06-22 ENCOUNTER — Encounter: Payer: Self-pay | Admitting: Oncology

## 2012-06-22 ENCOUNTER — Telehealth: Payer: Self-pay | Admitting: Oncology

## 2012-06-22 DIAGNOSIS — D696 Thrombocytopenia, unspecified: Secondary | ICD-10-CM | POA: Insufficient documentation

## 2012-06-22 NOTE — Telephone Encounter (Signed)
pt called back,ret. her call and new pt appt was made for 8/12    aom

## 2012-06-22 NOTE — Telephone Encounter (Signed)
Pt would like you to call her to discuss lab results.

## 2012-06-22 NOTE — Telephone Encounter (Signed)
l/m to call  re:new pt appt

## 2012-06-25 ENCOUNTER — Telehealth: Payer: Self-pay | Admitting: Oncology

## 2012-06-25 ENCOUNTER — Ambulatory Visit (HOSPITAL_BASED_OUTPATIENT_CLINIC_OR_DEPARTMENT_OTHER): Payer: Medicare Other | Admitting: Oncology

## 2012-06-25 ENCOUNTER — Other Ambulatory Visit (HOSPITAL_BASED_OUTPATIENT_CLINIC_OR_DEPARTMENT_OTHER): Payer: Medicare Other | Admitting: Lab

## 2012-06-25 ENCOUNTER — Encounter: Payer: Self-pay | Admitting: Oncology

## 2012-06-25 ENCOUNTER — Ambulatory Visit: Payer: Medicare Other

## 2012-06-25 VITALS — BP 128/59 | HR 64 | Temp 97.9°F | Resp 20 | Ht 59.0 in | Wt 153.9 lb

## 2012-06-25 DIAGNOSIS — D472 Monoclonal gammopathy: Secondary | ICD-10-CM

## 2012-06-25 DIAGNOSIS — E039 Hypothyroidism, unspecified: Secondary | ICD-10-CM

## 2012-06-25 DIAGNOSIS — D696 Thrombocytopenia, unspecified: Secondary | ICD-10-CM

## 2012-06-25 LAB — CBC WITH DIFFERENTIAL/PLATELET
Basophils Absolute: 0.1 10*3/uL (ref 0.0–0.1)
EOS%: 2.3 % (ref 0.0–7.0)
Eosinophils Absolute: 0.1 10*3/uL (ref 0.0–0.5)
HGB: 12.8 g/dL (ref 11.6–15.9)
MCH: 29.7 pg (ref 25.1–34.0)
NEUT#: 3.7 10*3/uL (ref 1.5–6.5)
RDW: 13.8 % (ref 11.2–14.5)
lymph#: 1.2 10*3/uL (ref 0.9–3.3)

## 2012-06-25 LAB — MORPHOLOGY

## 2012-06-25 NOTE — Telephone Encounter (Signed)
Referred by Dr. Berniece Andreas Dx- Anemia. NP packet mailed out.

## 2012-06-25 NOTE — Progress Notes (Signed)
[facility]  Telephone:(336) [phone] Fax:(336) [phone]     INITIAL HEMATOLOGY CONSULTATION    Referral MD:  Dr. Neta Mends. Panosh, M.D.  Reason for Referral: positive monoclonal spike.     HPI:  [name] is an 78 year old woman.  She has been in USOH until late last year when her Hgb starts to decrease.  Work up showed normal iron panel, Vit B12, folate, TSH.  A screening immunoglobulin showed normal levels; however, IFIX showed monoclonal IgM kappa.  She was thus kindly referred to the [facility] for evaluation.   [name] presented to the clinic by herself today.  She reported feeling well.  She still drives and lives by herself.  She has chronic lower back pain; worst in the morning when she first wakes up, improved with activities.  Her lower back pain does not radiate.  There is no leg weakness, bowel/blader incontinence, paresthesia in her legs.  Pain improved with at most once daily Tylenol.   Patient denies fever, anorexia, weight loss, fatigue, headache, visual changes, confusion, drenching night sweats, palpable lymph node swelling, mucositis, odynophagia, dysphagia, nausea vomiting, jaundice, chest pain, palpitation, shortness of breath, dyspnea on exertion, productive cough, gum bleeding, epistaxis, hematemesis, hemoptysis, abdominal pain, abdominal swelling, early satiety, melena, hematochezia, hematuria, skin rash, spontaneous bleeding, heat or cold intolerance, depression, suicidal or homicidal ideation, feeling hopelessness.    Past Medical History  Diagnosis Date  . Dyslipidemia   . Hypertension   . Palpitations     PVCs/bigeminy on event in May 2009 revealing this was relatively asymptomatic  . Chest pain   . Cardiovascular disease     moderate by USN 01/2010  . Degenerative joint disease   . Hx of varicella   . History of skin cancer     tafeen/ non melanoma.   . Thrombocytopenia   . Anemia   . Monoclonal gammopathies     :    Past Surgical History  Procedure Date  . Cholecystectomy 1999  . Cesarean section     times 2  . Shoulder surgery 1996    Right   . Cataract extraction     Bilateral  implantt  :   CURRENT MEDS: Current Outpatient Prescriptions  Medication Sig Dispense Refill  . acetaminophen (TYLENOL) 325 MG tablet Take 650 mg by mouth as needed.      Marland Kitchen aspirin 81 MG chewable tablet Chew 81 mg by mouth daily.      . calcium carbonate 200 MG capsule Take 250 mg by mouth daily.      . carvedilol (COREG) 3.125 MG tablet Take 1 tablet (3.125 mg total) by mouth 2 (two) times daily with a meal.  90 tablet  11  . levothyroxine (SYNTHROID, LEVOTHROID) 75 MCG tablet Take 1 tablet (75 mcg total) by mouth daily.  90 tablet  11  . nitroGLYCERIN (NITROSTAT) 0.4 MG SL tablet Place 0.4 mg under the tongue every 5 (five) minutes as needed. For chest pain      . quinapril (ACCUPRIL) 40 MG tablet Take 40 mg by mouth daily.      . simvastatin (ZOCOR) 20 MG tablet Take 1 tablet (20 mg total) by mouth at bedtime.  90 tablet  11      No Known Allergies:  Family History  Problem Relation Age of Onset  . Coronary artery disease Father   . Heart disease Father   . Lung cancer    . Alzheimer's disease  Mother   . Cancer Brother 24    lung cancer  :  History   Social History  . Marital Status: Widowed    Spouse Name: N/A    Number of Children: 3  . Years of Education: N/A   Occupational History  .      [employer], book keeping   Social History Main Topics  . Smoking status: Never Smoker   . Smokeless tobacco: Never Used  . Alcohol Use: No  . Drug Use: No  . Sexually Active: Not Currently   Other Topics Concern  . 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 Eats with her.   :  REVIEW OF SYSTEM:  The rest of the 14-point review of sytem was negative.   Exam: ECOG 1.   General:  well-nourished woman, in no acute distress.  Eyes:  no scleral icterus.  ENT:  There were no oropharyngeal lesions.  Neck was without thyromegaly.  Lymphatics:  Negative cervical, supraclavicular or axillary adenopathy.  Respiratory: lungs were clear bilaterally without wheezing or crackles.  Cardiovascular:  Regular rate and rhythm, S1/S2, without murmur, rub or gallop.  There was no pedal edema.  GI:  abdomen was soft, flat, nontender, nondistended, without organomegaly.  Muscoloskeletal:  no spinal tenderness of palpation of vertebral spine.  Skin exam was without echymosis, petichae.  Neuro exam was nonfocal.  Patient was able to get on and off exam table with only minimal assistance.  Gait was measured; she needs a cane to steady herself.  Patient was alerted and oriented.  Attention was good.   Language was appropriate.  Mood was normal without depression.  Speech was not pressured.  Thought content was not tangential.    LABS:  Lab Results  Component Value Date   WBC 5.6 06/25/2012   HGB 12.8 06/25/2012   HCT 39.2 06/25/2012   PLT 147 Platelet count consistent in citrate 06/25/2012   GLUCOSE 102* 06/25/2012   CHOL 172 10/03/2011   TRIG 63.0 10/03/2011   HDL 95.90 10/03/2011   LDLCALC 64 10/03/2011   ALT 10 06/25/2012   AST 14 06/25/2012   NA 141 06/25/2012   K 4.6 06/25/2012   CL 107 06/25/2012   CREATININE 0.96 06/25/2012   BUN 28* 06/25/2012   CO2 29 06/25/2012    ASSESSMENT AND PLAN:   1.  Hypertension:  Her blood pressure was well controlled with  Coreg, Accupril per PCP.  2.  hyperlipidemia: She is on simvastatin per PCP.  3.  hypothyroidism: She is on Synthroid per PCP. TSH today was within normal range.   4.  Slight anemia, and thrombocytopenia: These have resolved today.  Her last colonoscopy was reportedly negative about 9 years ago.  5.  monoclonal IgG kappa: -   Most likely abnormal protein is due to monoclonal gammopathy of undetermined significance (MGUS) and not active myeloma.  -  Work up:  I sent for the following: Serum protein electrophoresis, serum free kappa and lambda light chains, urine test 24 hours for Bence-Jones protein; skeletal X-ray.  If there are significant findings on blood test or Xray, I may consider bone marrow biopsy to diagnose myeloma.  - Otherwise, for MGUS, the treatment is observation.  The chance of progression from  MGUS to active myeloma is 1% per year.  I explained to the patient that it is unlikely that she has active multiple myeloma because she lacks anemia, renal insufficiency, hypercalcemia.  However, if she turns out to have active multiple murmur because of lytic bone lesion, and the treatment is certainly easy with oral chemotherapy by mouth. The patient was relieved to hear this.   6.  followup: In about 2 weeks to go over the results workup today.   Thank you for this referral.   The length of time of the face-to-face encounter was 45 minutes. More than 50% of time was spent counseling and coordination of care.

## 2012-06-25 NOTE — Telephone Encounter (Signed)
Pt was notified of results by Helmut Muster on 03/23/12.  Gave results again.  Pt is seeing hematology today.

## 2012-06-25 NOTE — Patient Instructions (Addendum)
1.  Issues:  Slight anemia, and low platelet count.  Presence of abnormal protein in blood.  2.  Most likely abnormal protein is due to monoclonal gammopathy of undetermined significance (MGUS) and not active myeloma.  3.  Work up:  Lab test, urine test 24 hours; skeletal X-ray to see if there are lytic lesions. If there are significant findings on blood test or Xray, I may consider bone marrow biopsy to diagnose myeloma.  5.  Otherwise, for MGUS, the treatment is observation.  The chance of progression from MGUS to active myeloma is 1% per year.    6.  If you were to have myeloma, the treatment is very mild with chemo pill.

## 2012-06-25 NOTE — Progress Notes (Signed)
Patient came in today as a new patient and she has one insurance Blue-Medicare,she said that she should be [place] as far as financial assistance.

## 2012-06-25 NOTE — Telephone Encounter (Signed)
Gave pt appt calendar for Bone Survey tomorrow 8/13, 1pm @ [facility] then see MD a few weeks later

## 2012-06-26 ENCOUNTER — Ambulatory Visit (HOSPITAL_COMMUNITY)
Admission: RE | Admit: 2012-06-26 | Discharge: 2012-06-26 | Disposition: A | Discharge status: Home / Self Care | Payer: Medicare Other | Source: Ambulatory Visit | Attending: Oncology | Admitting: Oncology

## 2012-06-26 DIAGNOSIS — D696 Thrombocytopenia, unspecified: Secondary | ICD-10-CM

## 2012-06-26 DIAGNOSIS — I700 Atherosclerosis of aorta: Secondary | ICD-10-CM | POA: Insufficient documentation

## 2012-06-26 DIAGNOSIS — IMO0002 Reserved for concepts with insufficient information to code with codable children: Secondary | ICD-10-CM | POA: Insufficient documentation

## 2012-06-26 DIAGNOSIS — D472 Monoclonal gammopathy: Secondary | ICD-10-CM | POA: Insufficient documentation

## 2012-06-26 DIAGNOSIS — M545 Low back pain, unspecified: Secondary | ICD-10-CM | POA: Insufficient documentation

## 2012-06-26 DIAGNOSIS — M5137 Other intervertebral disc degeneration, lumbosacral region: Secondary | ICD-10-CM | POA: Insufficient documentation

## 2012-06-26 DIAGNOSIS — M51379 Other intervertebral disc degeneration, lumbosacral region without mention of lumbar back pain or lower extremity pain: Secondary | ICD-10-CM | POA: Insufficient documentation

## 2012-06-26 DIAGNOSIS — M8448XA Pathological fracture, other site, initial encounter for fracture: Secondary | ICD-10-CM | POA: Insufficient documentation

## 2012-06-26 DIAGNOSIS — Q762 Congenital spondylolisthesis: Secondary | ICD-10-CM | POA: Insufficient documentation

## 2012-06-26 DIAGNOSIS — M4120 Other idiopathic scoliosis, site unspecified: Secondary | ICD-10-CM | POA: Insufficient documentation

## 2012-06-26 DIAGNOSIS — E039 Hypothyroidism, unspecified: Secondary | ICD-10-CM

## 2012-06-26 DIAGNOSIS — M5030 Other cervical disc degeneration, unspecified cervical region: Secondary | ICD-10-CM | POA: Insufficient documentation

## 2012-06-27 LAB — COMPREHENSIVE METABOLIC PANEL
ALT: 10 U/L (ref 0–35)
BUN: 28 mg/dL — ABNORMAL HIGH (ref 6–23)
CO2: 29 mEq/L (ref 19–32)
Creatinine, Ser: 0.96 mg/dL (ref 0.50–1.10)
Glucose, Bld: 102 mg/dL — ABNORMAL HIGH (ref 70–99)
Total Bilirubin: 0.6 mg/dL (ref 0.3–1.2)

## 2012-06-27 LAB — SPEP & IFE WITH QIG
Alpha-1-Globulin: 4.3 % (ref 2.9–4.9)
Beta 2: 6.9 % — ABNORMAL HIGH (ref 3.2–6.5)
Gamma Globulin: 14.3 % (ref 11.1–18.8)
IgA: 249 mg/dL (ref 69–380)
IgG (Immunoglobin G), Serum: 1030 mg/dL (ref 690–1700)
IgM, Serum: 206 mg/dL (ref 52–322)

## 2012-06-27 LAB — KAPPA/LAMBDA LIGHT CHAINS
Kappa free light chain: 2.29 mg/dL — ABNORMAL HIGH (ref 0.33–1.94)
Kappa:Lambda Ratio: 0.91 (ref 0.26–1.65)
Lambda Free Lght Chn: 2.51 mg/dL (ref 0.57–2.63)

## 2012-06-27 LAB — TSH: TSH: 1.78 u[IU]/mL (ref 0.350–4.500)

## 2012-07-04 ENCOUNTER — Other Ambulatory Visit: Payer: Self-pay | Admitting: Oncology

## 2012-07-05 LAB — UIFE/LIGHT CHAINS/TP QN, 24-HR UR
Albumin, U: DETECTED
Alpha 1, Urine: DETECTED — AB
Alpha 2, Urine: DETECTED — AB
Beta, Urine: DETECTED — AB
Free Kappa/Lambda Ratio: 12.44 ratio — ABNORMAL HIGH (ref 2.04–10.37)
Total Protein, Urine-Ur/day: 30 mg/d (ref 10–140)
Total Protein, Urine: 2.7 mg/dL

## 2012-07-11 ENCOUNTER — Telehealth: Payer: Self-pay | Admitting: Oncology

## 2012-07-11 ENCOUNTER — Ambulatory Visit (HOSPITAL_BASED_OUTPATIENT_CLINIC_OR_DEPARTMENT_OTHER): Payer: Medicare Other | Admitting: Oncology

## 2012-07-11 VITALS — BP 142/62 | HR 55 | Temp 97.1°F | Resp 20 | Ht 59.0 in | Wt 153.7 lb

## 2012-07-11 DIAGNOSIS — D472 Monoclonal gammopathy: Secondary | ICD-10-CM

## 2012-07-11 DIAGNOSIS — I10 Essential (primary) hypertension: Secondary | ICD-10-CM

## 2012-07-11 NOTE — Telephone Encounter (Signed)
gv pt appt schedule for February/March 2014.

## 2012-07-11 NOTE — Progress Notes (Signed)
[facility]  Telephone:(336) [phone] Fax:(336) [phone]   OFFICE PROGRESS NOTE   Cc:  Lorretta Harp, MD  DIAGNOSIS:   Monoclonal gammopathy of undetermined significance (MGUS).  CURRENT THERAPY:  Watchful observation.  INTERVAL HISTORY: [name] 78 y.o. female returns for her regular followup with her daughter to go over the workup for her positive M spike from 3 weeks ago.  She reports feeling well. She has baseline low back pain has been on for many years. The back pain does not require chronic pain medication. The pain does not prevent her from activities of daily living. She denies lower extremity paresthesia, bowel bladder incontinence.    Past Medical History  Diagnosis Date  . Dyslipidemia   . Hypertension   . Palpitations     PVCs/bigeminy on event in May 2009 revealing this was relatively asymptomatic  . Chest pain   . Cardiovascular disease     moderate by USN 01/2010  . Degenerative joint disease   . Hx of varicella   . History of skin cancer     tafeen/ non melanoma.   . Thrombocytopenia   . Anemia   . Monoclonal gammopathies     Past Surgical History  Procedure Date  . Cholecystectomy 1999  . Cesarean section     times 2  . Shoulder surgery 1996    Right   . Cataract extraction     Bilateral  implantt    Current Outpatient Prescriptions  Medication Sig Dispense Refill  . acetaminophen (TYLENOL) 325 MG tablet Take 650 mg by mouth as needed.      Marland Kitchen aspirin 81 MG chewable tablet Chew 81 mg by mouth daily.      . calcium carbonate 200 MG capsule Take 250 mg by mouth daily.      . carvedilol (COREG) 3.125 MG tablet Take 1 tablet (3.125 mg total) by mouth 2 (two) times daily with a meal.  90 tablet  11  . levothyroxine (SYNTHROID, LEVOTHROID) 75 MCG tablet Take 1 tablet (75 mcg total) by mouth daily.  90 tablet  11  . nitroGLYCERIN (NITROSTAT) 0.4 MG SL tablet Place 0.4 mg under the tongue every 5 (five) minutes as needed.  For chest pain      . quinapril (ACCUPRIL) 40 MG tablet Take 40 mg by mouth daily.      . simvastatin (ZOCOR) 20 MG tablet Take 1 tablet (20 mg total) by mouth at bedtime.  90 tablet  11    ALLERGIES:   has no known allergies.  REVIEW OF SYSTEMS:  The rest of the 14-point review of system was negative.   Filed Vitals:   07/11/12 1044  BP: 142/62  Pulse: 55  Temp: 97.1 F (36.2 C)  Resp: 20   Wt Readings from Last 3 Encounters:  07/11/12 153 lb 11.2 oz (69.718 kg)  06/25/12 153 lb 14.4 oz (69.809 kg)  05/24/12 153 lb 12.8 oz (69.763 kg)   ECOG Performance status: 1  PHYSICAL EXAMINATION: patient deferred exam since there was no new symptoms compared to last visit.   LABORATORY/RADIOLOGY DATA:  Lab Results  Component Value Date   WBC 5.6 06/25/2012   HGB 12.8 06/25/2012   HCT 39.2 06/25/2012   PLT 147 Platelet count consistent in citrate 06/25/2012   GLUCOSE 102* 06/25/2012   CHOL 172 10/03/2011   TRIG 63.0 10/03/2011   HDL 95.90 10/03/2011   LDLCALC 64 10/03/2011   ALKPHOS 69 06/25/2012   ALT 10  06/25/2012   AST 14 06/25/2012   NA 141 06/25/2012   K 4.6 06/25/2012   CL 107 06/25/2012   CREATININE 0.96 06/25/2012   BUN 28* 06/25/2012   CO2 29 06/25/2012    Dg Bone Survey Met  06/26/2012  *RADIOLOGY REPORT*  Clinical Data: Chronic low back pain.  Monoclonal spike.  Rule out myeloma.  METASTATIC BONE SURVEY  Comparison: Lumbar MRI 01/31/2012  Findings: The skull is negative.  No lytic skeletal lesions are seen suggestive of myeloma.  Disc and facet degeneration with spondylosis in the cervical spine. Lumbar scoliosis.  Thoracic disc degeneration and spurring.  Lumbar disc degeneration and spurring.  Grade 1 slip L5-S1.  Facet degeneration in the lumbar spine.  Mild compression fracture T12 is unchanged from prior MRI and appears benign on the MRI.  Aortic calcification without abdominal aortic aneurysm.  Degenerative changes in the shoulder joint and AC joint bilaterally.  Prior  rotator cuff repair on the right.  IMPRESSION: Chronic compression fracture T12.  Moderate to advanced disc and facet degeneration in the cervical, thoracic, and lumbar spine.  Chronic fractures T12, unchanged from the prior MRI.  No lytic bone lesions are identified.  Original Report Authenticated By: Camelia Phenes, M.D.       ASSESSMENT AND PLAN:  1. Hypertension: Her blood pressure was well controlled with Coreg, Accupril per PCP.   2. hyperlipidemia: She is on simvastatin per PCP.   3. hypothyroidism: She is on Synthroid per PCP.   4. Slight anemia, and thrombocytopenia: These have resolved.  Her last colonoscopy was reportedly negative about 9 years ago.   5.  IgG kappa monoclonal gammopathy of undetermined significance (MGUS):  - Work up: She had evidence of active multiple myeloma. She does not have anemia, hypercalcemia, renal disease, or lytic bone lesion on skeletal x-ray. - The chance of progression from MGUS to active myeloma is 1% per year. I explained to the patient and her daughter that there is no evidence of treatment of MGUS to prevent multiple myeloma because 99% the patient would be overtreated.  The patient and her daughter expressed informed understanding were happy that she did not have active disease.  6. Followup: Lab and return visit at the cancer Center in about 6 months.         The length of time of the face-to-face encounter was . More than 50% of time was spent counseling and coordination of care.

## 2012-07-11 NOTE — Patient Instructions (Addendum)
1. Issue: Positive monoclonal protein spike by testing with primary care doctor.  2. Diagnosis: MGUS (monoclonal gammopathy of undetermined significance). There is no evidence of active multiple myeloma this time. The chance of conversion from MGUS to active myeloma is about 1% per year.  3. Recommendation: Observation. There is no evidence of treatment MGUS to prevent multiple myeloma.  4. Follow up: Lab and return visit in about 6 months.   

## 2012-07-13 ENCOUNTER — Encounter: Payer: Self-pay | Admitting: Internal Medicine

## 2012-07-13 ENCOUNTER — Ambulatory Visit (INDEPENDENT_AMBULATORY_CARE_PROVIDER_SITE_OTHER): Payer: Medicare Other | Admitting: Internal Medicine

## 2012-07-13 VITALS — BP 102/50 | HR 73 | Ht 59.0 in | Wt 152.0 lb

## 2012-07-13 DIAGNOSIS — R079 Chest pain, unspecified: Secondary | ICD-10-CM

## 2012-07-13 NOTE — Patient Instructions (Signed)
Your physician wants you to follow-up in:  6 months. You will receive a reminder letter in the mail two months in advance. If you don't receive a letter, please call our office to schedule the follow-up appointment.   

## 2012-07-13 NOTE — Progress Notes (Signed)
HPI Patient is an 32 year od with a history of HTN, HL, aortic sclerosis  Had a normal myoview in 2011. Seen by Norma Fredrickson in July.  Had pain in R chest after falling.  Worst with inspiration.  XRAY showed no fx. Since seen she has been seen by Dr. Gaylyn Rong in oncologyl  Found to have MGUS. She currently denies CP.  No SOB  Does complain of back pain No Known Allergies  Current Outpatient Prescriptions  Medication Sig Dispense Refill  . acetaminophen (TYLENOL) 325 MG tablet Take 650 mg by mouth as needed.      Marland Kitchen aspirin 81 MG chewable tablet Chew 81 mg by mouth daily.      . calcium carbonate 200 MG capsule Take 250 mg by mouth daily.      . carvedilol (COREG) 3.125 MG tablet Take 1 tablet (3.125 mg total) by mouth 2 (two) times daily with a meal.  90 tablet  11  . levothyroxine (SYNTHROID, LEVOTHROID) 75 MCG tablet Take 1 tablet (75 mcg total) by mouth daily.  90 tablet  11  . nitroGLYCERIN (NITROSTAT) 0.4 MG SL tablet Place 0.4 mg under the tongue every 5 (five) minutes as needed. For chest pain      . quinapril (ACCUPRIL) 40 MG tablet Take 40 mg by mouth daily.      . simvastatin (ZOCOR) 20 MG tablet Take 1 tablet (20 mg total) by mouth at bedtime.  90 tablet  11    Past Medical History  Diagnosis Date  . Dyslipidemia   . Hypertension   . Palpitations     PVCs/bigeminy on event in May 2009 revealing this was relatively asymptomatic  . Chest pain   . Cardiovascular disease     moderate by USN 01/2010  . Degenerative joint disease   . Hx of varicella   . History of skin cancer     tafeen/ non melanoma.   . Thrombocytopenia   . Anemia   . Monoclonal gammopathies     Past Surgical History  Procedure Date  . Cholecystectomy 1999  . Cesarean section     times 2  . Shoulder surgery 1996    Right   . Cataract extraction     Bilateral  implantt    Family History  Problem Relation Age of Onset  . Coronary artery disease Father   . Heart disease Father   . Lung cancer    .  Alzheimer's disease Mother   . Cancer Brother 89    lung cancer    History   Social History  . Marital Status: Widowed    Spouse Name: N/A    Number of Children: 3  . Years of Education: N/A   Occupational History  .      [employer], book keeping   Social History Main Topics  . Smoking status: Never Smoker   . Smokeless tobacco: Never Used  . Alcohol Use: No  . Drug Use: No  . Sexually Active: Not Currently   Other Topics Concern  . 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 Eats with her.    Review of Systems:  All systems reviewed.  They are negative to the above problem except as previously stated.  Vital  Signs: BP 102/50  Pulse 73  Ht 4\' 11"  (1.499 m)  Wt 152 lb (68.947 kg)  BMI 30.70 kg/m2  Physical Exam Patient is in NAD HEENT:  Normocephalic, atraumatic. EOMI, PERRLA.  Neck: JVP is normal.  No bruits.  Lungs: clear to auscultation. No rales no wheezes.  Heart: Regular rate and rhythm. Normal S1, S2. No S3.   No significant murmurs. PMI not displaced.  Abdomen:  Supple, nontender. Normal bowel sounds. No masses. No hepatomegaly.  Extremities:   Good distal pulses throughout. No lower extremity edema.  Musculoskeletal :moving all extremities.  Neuro:   alert and oriented x3.  CN II-XII grossly intact.  EKG  SR.  76 bpm.  First degree av block  PR 264 msec. Assessment and Plan:  1.  CP  Denies.  2.  HTN  Good control  Denies dizziness  Wll need to follow closely esp with dx MGUS 3.  HL  Excellent in NOvember 2012.

## 2012-07-19 ENCOUNTER — Ambulatory Visit (INDEPENDENT_AMBULATORY_CARE_PROVIDER_SITE_OTHER): Payer: Medicare Other | Admitting: Internal Medicine

## 2012-07-19 ENCOUNTER — Encounter: Payer: Self-pay | Admitting: Internal Medicine

## 2012-07-19 VITALS — BP 120/60 | HR 41 | Temp 98.5°F | Wt 153.0 lb

## 2012-07-19 DIAGNOSIS — IMO0002 Reserved for concepts with insufficient information to code with codable children: Secondary | ICD-10-CM

## 2012-07-19 DIAGNOSIS — M79609 Pain in unspecified limb: Secondary | ICD-10-CM

## 2012-07-19 DIAGNOSIS — R1031 Right lower quadrant pain: Secondary | ICD-10-CM

## 2012-07-19 DIAGNOSIS — I999 Unspecified disorder of circulatory system: Secondary | ICD-10-CM | POA: Insufficient documentation

## 2012-07-19 DIAGNOSIS — M5135 Other intervertebral disc degeneration, thoracolumbar region: Secondary | ICD-10-CM

## 2012-07-19 DIAGNOSIS — M79604 Pain in right leg: Secondary | ICD-10-CM | POA: Insufficient documentation

## 2012-07-19 DIAGNOSIS — D472 Monoclonal gammopathy: Secondary | ICD-10-CM

## 2012-07-19 HISTORY — DX: Right lower quadrant pain: R10.31

## 2012-07-19 LAB — POCT URINALYSIS DIP (MANUAL ENTRY)
Bilirubin, UA: NEGATIVE
Blood, UA: NEGATIVE
Nitrite, UA: NEGATIVE
Protein Ur, POC: NEGATIVE
pH, UA: 5.5

## 2012-07-19 NOTE — Progress Notes (Signed)
  Subjective:    Patient ID: [name], female    DOB: 1925/09/28, 78 y.o.   MRN: [id number]  HPI Patient comes in today for follow up of  multiple medical problems.  Since last  Visit    Had heme eval for anemia and has MGUS following  CV stable   Injury from fall better.  NEw problems: Leg pain from ankle to hip and last 20 minutes  To hoirs and then comes and goes burning type of pain without incitors  Some mild back pain  No weakness  Intermittent right lower q pain.  With nausea.  No associated sx   Otherwise .   Review of Systems No fever dysuria hemturia  Some constipation at times .      Recent falling bleeding  CP sob.  Past history family history social history reviewed in the electronic medical record. Had ct 6 months go for lwoer left abd pain  No diverticulitis  Outpatient Encounter Prescriptions as of 07/19/2012  Medication Sig Dispense Refill  . acetaminophen (TYLENOL) 325 MG tablet Take 650 mg by mouth as needed.      Marland Kitchen aspirin 81 MG chewable tablet Chew 81 mg by mouth daily.      . calcium carbonate 200 MG capsule Take 250 mg by mouth daily.      . carvedilol (COREG) 3.125 MG tablet Take 1 tablet (3.125 mg total) by mouth 2 (two) times daily with a meal.  90 tablet  11  . levothyroxine (SYNTHROID, LEVOTHROID) 75 MCG tablet Take 1 tablet (75 mcg total) by mouth daily.  90 tablet  11  . nitroGLYCERIN (NITROSTAT) 0.4 MG SL tablet Place 0.4 mg under the tongue every 5 (five) minutes as needed. For chest pain      . quinapril (ACCUPRIL) 40 MG tablet Take 40 mg by mouth daily.      . simvastatin (ZOCOR) 20 MG tablet Take 1 tablet (20 mg total) by mouth at bedtime.  90 tablet  11       Objective:   Physical Exam BP 120/60  Pulse 41  Temp 98.5 F (36.9 C) (Oral)  Wt 153 lb (69.4 kg)  SpO2 98%  WDWN in nad  Oriented x 3 and no noted deficits in memory, attention, and speech. Abdomen:  Sof,t normal bowel sounds without hepatosplenomegaly, no guarding rebound  or masses no CVA tenderness  But  tender right lower quadrant upright and no obv mass  cannot tell if any hernia   No clubbing cyanosis or edema  leg hip nl rom   Gait mildly antalgic   No gross motor deficits Oriented x 3 and no noted deficits in memory, attention, and speech. Walks slwo bent over a bit     Assessment & Plan:   Episodic right LQ pain  New problem : Get ct abd pelvis abd .   ua today  Then plan fu. Right leg pain seems neuralgic  And radiating from back and cannot relat essily to her abd pain   Hip joint seems ok today.  Fall injury better  MGUS following hematology.  CV: stable

## 2012-07-19 NOTE — Assessment & Plan Note (Signed)
Fu depending on  Results.

## 2012-07-19 NOTE — Patient Instructions (Addendum)
Uncertain why you have pain in the right lower abdomen but  We are going to schedule a abd pelvic ct scan.  To decide on next step. Will contact you when results back .  The other discomfort leg seem to be pinched nerve possible from back.  And can take tylenol in the meantime.

## 2012-07-20 ENCOUNTER — Telehealth: Payer: Self-pay | Admitting: Internal Medicine

## 2012-07-20 ENCOUNTER — Ambulatory Visit (INDEPENDENT_AMBULATORY_CARE_PROVIDER_SITE_OTHER)
Admission: RE | Admit: 2012-07-20 | Discharge: 2012-07-20 | Disposition: A | Discharge status: Home / Self Care | Payer: Medicare Other | Source: Ambulatory Visit | Attending: Internal Medicine | Admitting: Internal Medicine

## 2012-07-20 DIAGNOSIS — R1031 Right lower quadrant pain: Secondary | ICD-10-CM

## 2012-07-20 MED ORDER — IOHEXOL 300 MG/ML  SOLN
100.0000 mL | Freq: Once | INTRAMUSCULAR | Status: AC | PRN
  Administered 2012-07-20: 100 mL via INTRAVENOUS

## 2012-07-20 NOTE — Telephone Encounter (Signed)
Patient is aware of CT results 

## 2012-07-20 NOTE — Telephone Encounter (Signed)
Pt called re: CT results also pt has a question re: her ov with Dr Fabian Sharp on 07/19/12.

## 2012-10-16 ENCOUNTER — Other Ambulatory Visit: Payer: Self-pay

## 2012-10-16 MED ORDER — CARVEDILOL 3.125 MG PO TABS: 3.1250 mg | ORAL_TABLET | Freq: Two times a day (BID) | ORAL | Status: DC

## 2012-12-10 ENCOUNTER — Ambulatory Visit (INDEPENDENT_AMBULATORY_CARE_PROVIDER_SITE_OTHER): Payer: Medicare Other | Admitting: Internal Medicine

## 2012-12-10 ENCOUNTER — Encounter: Payer: Self-pay | Admitting: Internal Medicine

## 2012-12-10 VITALS — BP 130/64 | HR 79 | Temp 98.1°F | Wt 154.0 lb

## 2012-12-10 DIAGNOSIS — R229 Localized swelling, mass and lump, unspecified: Secondary | ICD-10-CM

## 2012-12-10 DIAGNOSIS — I999 Unspecified disorder of circulatory system: Secondary | ICD-10-CM

## 2012-12-10 DIAGNOSIS — R300 Dysuria: Secondary | ICD-10-CM | POA: Insufficient documentation

## 2012-12-10 DIAGNOSIS — IMO0001 Reserved for inherently not codable concepts without codable children: Secondary | ICD-10-CM | POA: Insufficient documentation

## 2012-12-10 LAB — POCT URINALYSIS DIPSTICK
Blood, UA: NEGATIVE
Glucose, UA: NEGATIVE
Spec Grav, UA: 1.025
Urobilinogen, UA: 0.2

## 2012-12-10 MED ORDER — CEPHALEXIN 500 MG PO CAPS: 500.0000 mg | ORAL_CAPSULE | Freq: Two times a day (BID) | ORAL | Status: DC

## 2012-12-10 NOTE — Progress Notes (Signed)
Chief Complaint  Patient presents with  . Dysuria    Also has a burning when she urinates.  Started 3wks ago.  Thought it would clear up.  Treated with Vaseline.    HPI: Patient comes in today for SDA for  new problem evaluation. ? Had a pimple  Near vaginal area   And used vaseline at time  A while back  Then resolved  Has come back And now  Has frequency and  Burning with  Urination. no fever  Flank pain ROS: See pertinent positives and negatives per HPI. Had flushing dizziness momentarily recently no cp sob syncope. No abd pain nvd   Past Medical History  Diagnosis Date  . Dyslipidemia   . Hypertension   . Palpitations     PVCs/bigeminy on event in May 2009 revealing this was relatively asymptomatic  . Chest pain   . Cardiovascular disease     moderate by USN 01/2010  . Degenerative joint disease   . Hx of varicella   . History of skin cancer     tafeen/ non melanoma.   . Thrombocytopenia   . Anemia   . Monoclonal gammopathies   . Right lower quadrant abdominal pain 07/19/2012    Recurrent  With nausea    This is new since she had ct for llq pain in [facility]    R/o appendiceal problem  Hernia  Get ct scan  And plan  Fu     . Diastolic CHF, acute on chronic 01/31/2012    Family History  Problem Relation Age of Onset  . Coronary artery disease Father   . Heart disease Father   . Lung cancer    . Alzheimer's disease Mother   . Cancer Brother 37    lung cancer    History   Social History  . Marital Status: Widowed    Spouse Name: N/A    Number of Children: 3  . Years of Education: N/A   Occupational History  .      [employer], book keeping   Social History Main Topics  . Smoking status: Never Smoker   . Smokeless tobacco: Never Used  . Alcohol Use: No  . Drug Use: No  . Sexually Active: Not Currently   Other Topics Concern  . 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 Eats with her.    Outpatient Encounter Prescriptions as of 12/10/2012  Medication Sig Dispense Refill  . acetaminophen (TYLENOL) 325 MG tablet Take 650 mg by mouth as needed.      Marland Kitchen aspirin 81 MG chewable tablet Chew 81 mg by mouth daily.      . calcium carbonate 200 MG capsule Take 250 mg by mouth daily.      Marland Kitchen levothyroxine (SYNTHROID, LEVOTHROID) 75 MCG tablet Take 1 tablet (75 mcg total) by mouth daily.  90 tablet  11  . nitroGLYCERIN (NITROSTAT) 0.4 MG SL tablet Place 0.4 mg under the tongue every 5 (five) minutes as needed. For chest pain      . quinapril (ACCUPRIL) 40 MG tablet Take 40 mg by mouth daily.      . simvastatin (ZOCOR) 20 MG tablet Take 1 tablet (20 mg total) by mouth at bedtime.  90 tablet  11  .  cephALEXin (KEFLEX) 500 MG capsule Take 1 capsule (500 mg total) by mouth 2 (two) times daily.  10 capsule  0  . [DISCONTINUED] carvedilol (COREG) 3.125 MG tablet Take 1 tablet (3.125 mg total) by mouth 2 (two) times daily with a meal.  180 tablet  3    EXAM:  BP 130/64  Pulse 79  Temp 98.1 F (36.7 C) (Oral)  Wt 154 lb (69.854 kg)  SpO2 96%  There is no height on file to calculate BMI.  GENERAL: vitals reviewed and listed above, alert, oriented, appears well hydrated and in no acute distress  HEENT: atraumatic, conjunctiva  clear, no obvious abnormalities on inspection of external nose and ears OP : no lesion edema or exudate  NECK: no obvious masses on inspection palpation  No jvd Kyphosis independent gait . MS: moves all extremities without noticeable focal  Abnormality abd soft  EXT GU:  3-4 mm yellow  nodule non tender upper r labia   Rest of gu mild redness no lesion  PSYCH: pleasant and cooperative, no obvious depression or anxiety cognition appears intact, ua trc leuk ASSESSMENT AND PLAN:  Discussed the following assessment and  plan:  1. Dysuria  POCT urinalysis dipstick, POC Urinalysis Dipstick, Urine culture   poss uti  rx and do ucs  2. Bump labial nodule    3. Vascular disease     stable by hx and context    -Patient advised to return or notify health care team  if symptoms worsen or persist or new concerns arise.  Patient Instructions  I think the  Bump is probably a  Plugged gland .  Can use warm compresses  A couple times a day and see how this does.    Also you may have a UTI and will  Send in an antibiotic   In the meantime will contact ou adout culture results when available.    Expect improvement in the next 3 - 5 days.   Urinary Tract Infection Urinary tract infections (UTIs) can develop anywhere along your urinary tract. Your urinary tract is your body's drainage system for removing wastes and extra water. Your urinary tract includes two kidneys, two ureters, a bladder, and a urethra. Your kidneys are a pair of bean-shaped organs. Each kidney is about the size of your fist. They are located below your ribs, one on each side of your spine. CAUSES Infections are caused by microbes, which are microscopic organisms, including fungi, viruses, and bacteria. These organisms are so small that they can only be seen through a microscope. Bacteria are the microbes that most commonly cause UTIs. SYMPTOMS  Symptoms of UTIs may vary by age and gender of the patient and by the location of the infection. Symptoms in young women typically include a frequent and intense urge to urinate and a painful, burning feeling in the bladder or urethra during urination. Older women and men are more likely to be tired, shaky, and weak and have muscle aches and abdominal pain. A fever may mean the infection is in your kidneys. Other symptoms of a kidney infection include pain in your back or sides below the ribs, nausea, and vomiting. DIAGNOSIS To diagnose a UTI, your caregiver will ask you about your symptoms. Your caregiver also  will ask to provide a urine sample. The urine sample will be tested for bacteria and white blood cells. White blood cells are made by your body to help fight infection. TREATMENT  Typically, UTIs can be treated with  medication. Because most UTIs are caused by a bacterial infection, they usually can be treated with the use of antibiotics. The choice of antibiotic and length of treatment depend on your symptoms and the type of bacteria causing your infection. HOME CARE INSTRUCTIONS  If you were prescribed antibiotics, take them exactly as your caregiver instructs you. Finish the medication even if you feel better after you have only taken some of the medication.  Drink enough water and fluids to keep your urine clear or pale yellow.  Avoid caffeine, tea, and carbonated beverages. They tend to irritate your bladder.  Empty your bladder often. Avoid holding urine for long periods of time.  Empty your bladder before and after sexual intercourse.  After a bowel movement, women should cleanse from front to back. Use each tissue only once. SEEK MEDICAL CARE IF:   You have back pain.  You develop a fever.  Your symptoms do not begin to resolve within 3 days. SEEK IMMEDIATE MEDICAL CARE IF:   You have severe back pain or lower abdominal pain.  You develop chills.  You have nausea or vomiting.  You have continued burning or discomfort with urination. MAKE SURE YOU:   Understand these instructions.  Will watch your condition.  Will get help right away if you are not doing well or get worse. Document Released: 08/10/2005 Document Revised: 05/01/2012 Document Reviewed: 12/09/2011 [facility] Patient Information 2013 [place], [place].      Neta Mends. Cowan Pilar M.D.

## 2012-12-10 NOTE — Patient Instructions (Addendum)
I think the  Bump is probably a  Plugged gland .  Can use warm compresses  A couple times a day and see how this does.    Also you may have a UTI and will  Send in an antibiotic   In the meantime will contact ou adout culture results when available.    Expect improvement in the next 3 - 5 days.   Urinary Tract Infection Urinary tract infections (UTIs) can develop anywhere along your urinary tract. Your urinary tract is your body's drainage system for removing wastes and extra water. Your urinary tract includes two kidneys, two ureters, a bladder, and a urethra. Your kidneys are a pair of bean-shaped organs. Each kidney is about the size of your fist. They are located below your ribs, one on each side of your spine. CAUSES Infections are caused by microbes, which are microscopic organisms, including fungi, viruses, and bacteria. These organisms are so small that they can only be seen through a microscope. Bacteria are the microbes that most commonly cause UTIs. SYMPTOMS  Symptoms of UTIs may vary by age and gender of the patient and by the location of the infection. Symptoms in young women typically include a frequent and intense urge to urinate and a painful, burning feeling in the bladder or urethra during urination. Older women and men are more likely to be tired, shaky, and weak and have muscle aches and abdominal pain. A fever may mean the infection is in your kidneys. Other symptoms of a kidney infection include pain in your back or sides below the ribs, nausea, and vomiting. DIAGNOSIS To diagnose a UTI, your caregiver will ask you about your symptoms. Your caregiver also will ask to provide a urine sample. The urine sample will be tested for bacteria and white blood cells. White blood cells are made by your body to help fight infection. TREATMENT  Typically, UTIs can be treated with medication. Because most UTIs are caused by a bacterial infection, they usually can be treated with the use of  antibiotics. The choice of antibiotic and length of treatment depend on your symptoms and the type of bacteria causing your infection. HOME CARE INSTRUCTIONS  If you were prescribed antibiotics, take them exactly as your caregiver instructs you. Finish the medication even if you feel better after you have only taken some of the medication.  Drink enough water and fluids to keep your urine clear or pale yellow.  Avoid caffeine, tea, and carbonated beverages. They tend to irritate your bladder.  Empty your bladder often. Avoid holding urine for long periods of time.  Empty your bladder before and after sexual intercourse.  After a bowel movement, women should cleanse from front to back. Use each tissue only once. SEEK MEDICAL CARE IF:   You have back pain.  You develop a fever.  Your symptoms do not begin to resolve within 3 days. SEEK IMMEDIATE MEDICAL CARE IF:   You have severe back pain or lower abdominal pain.  You develop chills.  You have nausea or vomiting.  You have continued burning or discomfort with urination. MAKE SURE YOU:   Understand these instructions.  Will watch your condition.  Will get help right away if you are not doing well or get worse. Document Released: 08/10/2005 Document Revised: 05/01/2012 Document Reviewed: 12/09/2011 [facility] Patient Information 2013 [place], [place].

## 2012-12-12 LAB — URINE CULTURE: Organism ID, Bacteria: NO GROWTH

## 2012-12-13 ENCOUNTER — Telehealth: Payer: Self-pay | Admitting: Internal Medicine

## 2012-12-13 NOTE — Telephone Encounter (Signed)
[facility] Triage Call Report Triage Record Num: [id number] Operator: Alphonsa Overall Patient Name: [name] Call Date & Time: 12/12/2012 6:09:34PM Patient Phone: [phone] PCP: Neta Mends. Panosh Patient Gender: Female PCP Fax : [phone] Patient DOB: [birth year] Practice Name: [facility] Reason for Call: [name] calling office nurse back from message left 12/12/12. Per EPIC Notes Recorded by Madelin Headings, MD on 12/12/2012 at 1:54 PM "Inform patient that culture shows no growth but take the med any way and fu if persistent or progressive". Pt aware and declines further triage. Is continuing with urinary burning. Will continue treatment and will f/u as needed. Protocol(s) Used: Office Note Recommended Outcome per Protocol: Information Noted and Sent to Office Reason for Outcome: Caller information to office Care Advice: ~ 01/29/

## 2012-12-13 NOTE — Telephone Encounter (Signed)
Caller was calling about the urine culture results.  RN found results in EPIC:  "Notes Recorded by Madelin Headings, MD on 12/12/2012 at 1:54 PM Inform patient that culture shows no growth but take the med any way and fu if persistent or progressive"  Pt verbalized understanding of information.  Nothing further needed.

## 2012-12-31 ENCOUNTER — Telehealth: Payer: Self-pay | Admitting: Internal Medicine

## 2012-12-31 NOTE — Telephone Encounter (Signed)
Per refill line  Pt would like Simvastatin and Levothyroxine refilled to [facility]

## 2013-01-01 ENCOUNTER — Other Ambulatory Visit: Payer: Self-pay

## 2013-01-01 MED ORDER — LEVOTHYROXINE SODIUM 75 MCG PO TABS: 75.0000 ug | ORAL_TABLET | Freq: Every day | ORAL | Status: DC

## 2013-01-01 MED ORDER — QUINAPRIL HCL 40 MG PO TABS: 40.0000 mg | ORAL_TABLET | Freq: Every day | ORAL | Status: DC

## 2013-01-01 MED ORDER — SIMVASTATIN 20 MG PO TABS: 20.0000 mg | ORAL_TABLET | Freq: Every day | ORAL | Status: DC

## 2013-01-01 NOTE — Telephone Encounter (Signed)
Per refill line  Pt called again in the afternoon

## 2013-01-03 ENCOUNTER — Other Ambulatory Visit: Payer: Self-pay

## 2013-01-03 MED ORDER — LEVOTHYROXINE SODIUM 75 MCG PO TABS: 75.0000 ug | ORAL_TABLET | Freq: Every day | ORAL | Status: DC

## 2013-01-03 MED ORDER — SIMVASTATIN 20 MG PO TABS: 20.0000 mg | ORAL_TABLET | Freq: Every day | ORAL | Status: DC

## 2013-01-11 ENCOUNTER — Other Ambulatory Visit (HOSPITAL_BASED_OUTPATIENT_CLINIC_OR_DEPARTMENT_OTHER): Payer: Medicare Other | Admitting: Lab

## 2013-01-11 DIAGNOSIS — D472 Monoclonal gammopathy: Secondary | ICD-10-CM

## 2013-01-11 DIAGNOSIS — D696 Thrombocytopenia, unspecified: Secondary | ICD-10-CM

## 2013-01-11 LAB — CBC WITH DIFFERENTIAL/PLATELET
BASO%: 1.3 % (ref 0.0–2.0)
EOS%: 2.4 % (ref 0.0–7.0)
HCT: 40.7 % (ref 34.8–46.6)
MCH: 29.9 pg (ref 25.1–34.0)
MCHC: 33.5 g/dL (ref 31.5–36.0)
MONO#: 0.5 10*3/uL (ref 0.1–0.9)
NEUT%: 58.6 % (ref 38.4–76.8)
RDW: 13.6 % (ref 11.2–14.5)
WBC: 5.7 10*3/uL (ref 3.9–10.3)
lymph#: 1.6 10*3/uL (ref 0.9–3.3)

## 2013-01-11 LAB — COMPREHENSIVE METABOLIC PANEL (CC13)
ALT: 13 U/L (ref 0–55)
AST: 18 U/L (ref 5–34)
Albumin: 3.4 g/dL — ABNORMAL LOW (ref 3.5–5.0)
CO2: 26 mEq/L (ref 22–29)
Calcium: 10.1 mg/dL (ref 8.4–10.4)
Chloride: 107 mEq/L (ref 98–107)
Potassium: 4.5 mEq/L (ref 3.5–5.1)
Sodium: 140 mEq/L (ref 136–145)
Total Protein: 6.9 g/dL (ref 6.4–8.3)

## 2013-01-15 LAB — KAPPA/LAMBDA LIGHT CHAINS
Kappa free light chain: 2.27 mg/dL — ABNORMAL HIGH (ref 0.33–1.94)
Lambda Free Lght Chn: 2.25 mg/dL (ref 0.57–2.63)

## 2013-01-15 LAB — PROTEIN ELECTROPHORESIS, SERUM
Alpha-2-Globulin: 10.6 % (ref 7.1–11.8)
Gamma Globulin: 14 % (ref 11.1–18.8)
Total Protein, Serum Electrophoresis: 6.9 g/dL (ref 6.0–8.3)

## 2013-01-18 ENCOUNTER — Encounter: Payer: Medicare Other | Admitting: Oncology

## 2013-01-18 ENCOUNTER — Telehealth: Payer: Self-pay | Admitting: Oncology

## 2013-01-18 NOTE — Telephone Encounter (Signed)
S/w pt re appt for 3/18.

## 2013-01-18 NOTE — Progress Notes (Signed)
This encounter was created in error - please disregard.

## 2013-01-29 ENCOUNTER — Encounter: Payer: Self-pay | Admitting: Oncology

## 2013-01-29 ENCOUNTER — Ambulatory Visit (HOSPITAL_BASED_OUTPATIENT_CLINIC_OR_DEPARTMENT_OTHER): Payer: Medicare Other | Admitting: Oncology

## 2013-01-29 ENCOUNTER — Telehealth: Payer: Self-pay | Admitting: Oncology

## 2013-01-29 VITALS — BP 147/66 | HR 61 | Temp 97.4°F | Resp 18

## 2013-01-29 DIAGNOSIS — D472 Monoclonal gammopathy: Secondary | ICD-10-CM

## 2013-01-29 DIAGNOSIS — I10 Essential (primary) hypertension: Secondary | ICD-10-CM

## 2013-01-29 NOTE — Telephone Encounter (Signed)
Gave pt appt for lab and inj , MD for April 2014

## 2013-01-29 NOTE — Progress Notes (Signed)
[facility]  Telephone:([phone] Fax:(336) [phone]   OFFICE PROGRESS NOTE   Cc:  Lorretta Harp, MD  DIAGNOSIS:   Monoclonal gammopathy of undetermined significance (MGUS).  CURRENT THERAPY:  Watchful observation.  INTERVAL HISTORY: [name] 79 y.o. female returns for her regular followup by herself.  She reports feeling well. She has baseline low back pain has been on for many years. The back pain does not require chronic pain medication. The pain does not prevent her from activities of daily living. She denies lower extremity paresthesia, bowel bladder incontinence.    Past Medical History  Diagnosis Date  . Dyslipidemia   . Hypertension   . Palpitations     PVCs/bigeminy on event in May 2009 revealing this was relatively asymptomatic  . Chest pain   . Cardiovascular disease     moderate by USN 01/2010  . Degenerative joint disease   . Hx of varicella   . History of skin cancer     tafeen/ non melanoma.   . Thrombocytopenia   . Anemia   . Monoclonal gammopathies   . Right lower quadrant abdominal pain 07/19/2012    Recurrent  With nausea    This is new since she had ct for llq pain in [facility]    R/o appendiceal problem  Hernia  Get ct scan  And plan  Fu     . Diastolic CHF, acute on chronic 01/31/2012    Past Surgical History  Procedure Laterality Date  . Cholecystectomy  1999  . Cesarean section      times 2  . Shoulder surgery  1996    Right   . Cataract extraction      Bilateral  implantt    Current Outpatient Prescriptions  Medication Sig Dispense Refill  . acetaminophen (TYLENOL) 325 MG tablet Take 650 mg by mouth as needed.      Marland Kitchen aspirin 81 MG chewable tablet Chew 81 mg by mouth daily.      . calcium carbonate 200 MG capsule Take 250 mg by mouth daily.      . carvedilol (COREG) 3.125 MG tablet Take 3.125 mg by mouth 2 (two) times daily.      Marland Kitchen levothyroxine (SYNTHROID, LEVOTHROID) 75 MCG tablet Take 1 tablet (75 mcg  total) by mouth daily.  90 tablet  3  . nitroGLYCERIN (NITROSTAT) 0.4 MG SL tablet Place 0.4 mg under the tongue every 5 (five) minutes as needed. For chest pain      . quinapril (ACCUPRIL) 40 MG tablet Take 1 tablet (40 mg total) by mouth daily.  90 tablet  3  . simvastatin (ZOCOR) 20 MG tablet Take 1 tablet (20 mg total) by mouth at bedtime.  90 tablet  3   No current facility-administered medications for this visit.    ALLERGIES:  has No Known Allergies.  REVIEW OF SYSTEMS:  The rest of the 14-point review of system was negative.   Filed Vitals:   01/29/13 1150  BP: 147/66  Pulse: 61  Temp: 97.4 F (36.3 C)  Resp: 18   Wt Readings from Last 3 Encounters:  12/10/12 154 lb (69.854 kg)  07/19/12 153 lb (69.4 kg)  07/13/12 152 lb (68.947 kg)   ECOG Performance status: 1  PHYSICAL EXAMINATION: patient deferred exam since there was no new symptoms compared to last visit.   LABORATORY/RADIOLOGY DATA:  Lab Results  Component Value Date   WBC 5.7 01/11/2013   HGB 13.6 01/11/2013  HCT 40.7 01/11/2013   PLT 140 01/11/2013   GLUCOSE 94 01/11/2013   CHOL 172 10/03/2011   TRIG 63.0 10/03/2011   HDL 95.90 10/03/2011   LDLCALC 64 10/03/2011   ALKPHOS 81 01/11/2013   ALT 13 01/11/2013   AST 18 01/11/2013   NA 140 01/11/2013   K 4.5 01/11/2013   CL 107 01/11/2013   CREATININE 0.9 01/11/2013   BUN 21.2 01/11/2013   CO2 26 01/11/2013     ASSESSMENT AND PLAN:  1. Hypertension: Her blood pressure is slightly elevated today, but patient nervous about driving in the bad weather to get here. I encouraged her to continue to monitor it periodically at home and f/u with PCP fir further titration of medications as needed.    2. hyperlipidemia: She is on simvastatin per PCP.   3. hypothyroidism: She is on Synthroid per PCP.   4. Slight anemia, and thrombocytopenia: These have resolved.  Her last colonoscopy was reportedly negative about 9 years ago.   5.  IgG kappa monoclonal gammopathy of  undetermined significance (MGUS):  - Work up: She has no evidence of active multiple myeloma. She does not have anemia, hypercalcemia, renal disease, or lytic bone lesion on skeletal x-ray. - The chance of progression from MGUS to active myeloma is 1% per year. I explained to the patient and her daughter that there is no evidence of treatment of MGUS to prevent multiple myeloma because 99% the patient would be overtreated.  Plan is for continued observation.  6. Followup: Lab and return visit at the cancer Center in about 6 months.         The length of time of the face-to-face encounter was 15 minutes. More than 50% of time was spent counseling and coordination of care.

## 2013-01-29 NOTE — Patient Instructions (Signed)
1. Issue: Positive monoclonal protein spike by testing with primary care doctor.  2. Diagnosis: MGUS (monoclonal gammopathy of undetermined significance). There is no evidence of active multiple myeloma this time. The chance of conversion from MGUS to active myeloma is about 1% per year.  3. Recommendation: Observation. There is no evidence of treatment MGUS to prevent multiple myeloma.  4. Follow up: Lab and return visit in about 6 months.

## 2013-03-07 ENCOUNTER — Encounter (INDEPENDENT_AMBULATORY_CARE_PROVIDER_SITE_OTHER): Payer: Medicare Other

## 2013-03-07 DIAGNOSIS — I6529 Occlusion and stenosis of unspecified carotid artery: Secondary | ICD-10-CM

## 2013-07-10 ENCOUNTER — Emergency Department (HOSPITAL_COMMUNITY): Payer: Medicare Other

## 2013-07-10 ENCOUNTER — Encounter (HOSPITAL_COMMUNITY): Payer: Self-pay | Admitting: Emergency Medicine

## 2013-07-10 ENCOUNTER — Emergency Department (HOSPITAL_COMMUNITY)
Admission: EM | Admit: 2013-07-10 | Discharge: 2013-07-11 | Disposition: A | Discharge status: Home / Self Care | Payer: Medicare Other | Attending: Emergency Medicine | Admitting: Emergency Medicine

## 2013-07-10 DIAGNOSIS — Z79899 Other long term (current) drug therapy: Secondary | ICD-10-CM | POA: Insufficient documentation

## 2013-07-10 DIAGNOSIS — S4980XA Other specified injuries of shoulder and upper arm, unspecified arm, initial encounter: Secondary | ICD-10-CM | POA: Insufficient documentation

## 2013-07-10 DIAGNOSIS — W19XXXA Unspecified fall, initial encounter: Secondary | ICD-10-CM

## 2013-07-10 DIAGNOSIS — Z862 Personal history of diseases of the blood and blood-forming organs and certain disorders involving the immune mechanism: Secondary | ICD-10-CM | POA: Insufficient documentation

## 2013-07-10 DIAGNOSIS — I5033 Acute on chronic diastolic (congestive) heart failure: Secondary | ICD-10-CM | POA: Insufficient documentation

## 2013-07-10 DIAGNOSIS — E785 Hyperlipidemia, unspecified: Secondary | ICD-10-CM | POA: Insufficient documentation

## 2013-07-10 DIAGNOSIS — Z8639 Personal history of other endocrine, nutritional and metabolic disease: Secondary | ICD-10-CM | POA: Insufficient documentation

## 2013-07-10 DIAGNOSIS — Z8679 Personal history of other diseases of the circulatory system: Secondary | ICD-10-CM | POA: Insufficient documentation

## 2013-07-10 DIAGNOSIS — I10 Essential (primary) hypertension: Secondary | ICD-10-CM | POA: Insufficient documentation

## 2013-07-10 DIAGNOSIS — Y9301 Activity, walking, marching and hiking: Secondary | ICD-10-CM | POA: Insufficient documentation

## 2013-07-10 DIAGNOSIS — M25511 Pain in right shoulder: Secondary | ICD-10-CM

## 2013-07-10 DIAGNOSIS — S0003XA Contusion of scalp, initial encounter: Secondary | ICD-10-CM | POA: Insufficient documentation

## 2013-07-10 DIAGNOSIS — Z23 Encounter for immunization: Secondary | ICD-10-CM | POA: Insufficient documentation

## 2013-07-10 DIAGNOSIS — Z8619 Personal history of other infectious and parasitic diseases: Secondary | ICD-10-CM | POA: Insufficient documentation

## 2013-07-10 DIAGNOSIS — Y9289 Other specified places as the place of occurrence of the external cause: Secondary | ICD-10-CM | POA: Insufficient documentation

## 2013-07-10 DIAGNOSIS — S0990XA Unspecified injury of head, initial encounter: Secondary | ICD-10-CM | POA: Insufficient documentation

## 2013-07-10 DIAGNOSIS — H5789 Other specified disorders of eye and adnexa: Secondary | ICD-10-CM | POA: Insufficient documentation

## 2013-07-10 DIAGNOSIS — W1809XA Striking against other object with subsequent fall, initial encounter: Secondary | ICD-10-CM | POA: Insufficient documentation

## 2013-07-10 DIAGNOSIS — S298XXA Other specified injuries of thorax, initial encounter: Secondary | ICD-10-CM | POA: Insufficient documentation

## 2013-07-10 DIAGNOSIS — Z7982 Long term (current) use of aspirin: Secondary | ICD-10-CM | POA: Insufficient documentation

## 2013-07-10 DIAGNOSIS — S51809A Unspecified open wound of unspecified forearm, initial encounter: Secondary | ICD-10-CM | POA: Insufficient documentation

## 2013-07-10 DIAGNOSIS — M1990 Unspecified osteoarthritis, unspecified site: Secondary | ICD-10-CM | POA: Insufficient documentation

## 2013-07-10 DIAGNOSIS — S46909A Unspecified injury of unspecified muscle, fascia and tendon at shoulder and upper arm level, unspecified arm, initial encounter: Secondary | ICD-10-CM | POA: Insufficient documentation

## 2013-07-10 DIAGNOSIS — IMO0002 Reserved for concepts with insufficient information to code with codable children: Secondary | ICD-10-CM | POA: Insufficient documentation

## 2013-07-10 DIAGNOSIS — Z85828 Personal history of other malignant neoplasm of skin: Secondary | ICD-10-CM | POA: Insufficient documentation

## 2013-07-10 LAB — POCT I-STAT, CHEM 8
HCT: 37 % (ref 36.0–46.0)
Hemoglobin: 12.6 g/dL (ref 12.0–15.0)
Sodium: 140 mEq/L (ref 135–145)
TCO2: 23 mmol/L (ref 0–100)

## 2013-07-10 MED ORDER — TETANUS-DIPHTH-ACELL PERTUSSIS 5-2.5-18.5 LF-MCG/0.5 IM SUSP
0.5000 mL | Freq: Once | INTRAMUSCULAR | Status: AC
  Administered 2013-07-10: 0.5 mL via INTRAMUSCULAR
  Filled 2013-07-10: qty 0.5

## 2013-07-10 NOTE — ED Provider Notes (Signed)
CSN: [id number]     Arrival date & time 07/10/13  2116 History   First MD Initiated Contact with Patient 07/10/13 2150     Chief Complaint  Patient presents with  . Fall     HPI  [name] is a 79 year old female with a PMH of HTN, CAD, CHF, anemia, thrombocytopenia, hx of skin cancer, degenerative joint disease, and dyslipidemia who presents to the ED for evaluation of a fall.  Patient was brought in by her daughter and husband in law. Patient states that on Monday (07/07/13) she was taking her dog outside when her dog lunged forward pulling her her forward causing her to fall face-first.  When she stood up she hit the top of her head on her porch.  She sustained a hematoma to her forehead and top of her head. She denied any loss of consciousness. She currently denies any headache, dizziness, or lightheadedness.  She was able to ambulate after her injury. She sustained a laceration to her right lower forearm where the leash got wrapped around her wrist.  She states that today she developed swelling and bruising under her eyes bilaterally.  No eye pain, photophobia, or vision changes.  No jaw pain, dental trauma, neck pain or stiffness.  She states she has some mid-sternal sharp chest pain only with right shoulder movement.  No chest pain at rest.  She denies any SOB, cough, or wheezing.  She also states that the pain radiates to her right shoulder blade, which is also causing her pain from her fall.  She has bruising on her right shoulder with good ROM.  She denies any numbness, tingling, loss of sensation, or weakness.  She denies any abdominal pain, nausea, emesis, diarrhea, constipation, hematuria, or dysuria.   She states she had right sided hip pain at baseline before her accident with no acute changes.  She has been able to ambulate without difficulty.  She was seen at [facility] on Monday after her accident.     Past Medical History  Diagnosis Date  . Dyslipidemia   . Hypertension   .  Palpitations     PVCs/bigeminy on event in May 2009 revealing this was relatively asymptomatic  . Chest pain   . Cardiovascular disease     moderate by USN 01/2010  . Degenerative joint disease   . Hx of varicella   . History of skin cancer     tafeen/ non melanoma.   . Thrombocytopenia   . Anemia   . Monoclonal gammopathies   . Right lower quadrant abdominal pain 07/19/2012    Recurrent  With nausea    This is new since she had ct for llq pain in [facility]    R/o appendiceal problem  Hernia  Get ct scan  And plan  Fu     . Diastolic CHF, acute on chronic 01/31/2012   Past Surgical History  Procedure Laterality Date  . Cholecystectomy  1999  . Cesarean section      times 2  . Shoulder surgery  1996    Right   . Cataract extraction      Bilateral  implantt   Family History  Problem Relation Age of Onset  . Coronary artery disease Father   . Heart disease Father   . Lung cancer    . Alzheimer's disease Mother   . Cancer Brother 65    lung cancer   History  Substance Use Topics  . Smoking status: Never Smoker   .  Smokeless tobacco: Never Used  . Alcohol Use: No   OB History   Grav Para Term Preterm Abortions TAB SAB Ect Mult Living                 Review of Systems  Constitutional: Negative for fever, chills, activity change, appetite change and fatigue.  HENT: Positive for facial swelling. Negative for ear pain, nosebleeds, congestion, sore throat, rhinorrhea, trouble swallowing, neck pain, neck stiffness, dental problem and sinus pressure.   Eyes: Negative for photophobia, pain, discharge, redness, itching and visual disturbance.  Respiratory: Negative for cough, shortness of breath and wheezing.   Cardiovascular: Positive for chest pain. Negative for leg swelling.  Gastrointestinal: Negative for nausea, vomiting, abdominal pain, diarrhea, constipation and abdominal distention.  Genitourinary: Negative for dysuria and hematuria.  Musculoskeletal: Positive for back pain  (at baseline). Negative for gait problem.  Skin: Positive for wound.  Neurological: Negative for dizziness, syncope, weakness, light-headedness, numbness and headaches.  Psychiatric/Behavioral: Negative for confusion.    Allergies  Review of patient's allergies indicates no known allergies.  Home Medications   Current Outpatient Rx  Name  Route  Sig  Dispense  Refill  . acetaminophen (TYLENOL) 325 MG tablet   Oral   Take 650 mg by mouth as needed.         Marland Kitchen aspirin 81 MG chewable tablet   Oral   Chew 81 mg by mouth daily.         . calcium carbonate 200 MG capsule   Oral   Take 250 mg by mouth daily.         . carvedilol (COREG) 3.125 MG tablet   Oral   Take 3.125 mg by mouth 2 (two) times daily.         Marland Kitchen levothyroxine (SYNTHROID, LEVOTHROID) 75 MCG tablet   Oral   Take 1 tablet (75 mcg total) by mouth daily.   90 tablet   3   . nitroGLYCERIN (NITROSTAT) 0.4 MG SL tablet   Sublingual   Place 0.4 mg under the tongue every 5 (five) minutes as needed. For chest pain         . quinapril (ACCUPRIL) 40 MG tablet   Oral   Take 1 tablet (40 mg total) by mouth daily.   90 tablet   3   . simvastatin (ZOCOR) 20 MG tablet   Oral   Take 1 tablet (20 mg total) by mouth at bedtime.   90 tablet   3    BP 173/70  Pulse 71  Temp(Src) 98.1 F (36.7 C) (Oral)  Resp 20  Ht 4\' 10"  (1.473 m)  Wt 157 lb (71.215 kg)  BMI 32.82 kg/m2  SpO2 98%  Filed Vitals:   07/10/13 2124 07/10/13 2137 07/10/13 2339 07/11/13 0112  BP: 173/70  132/63 135/66  Pulse: 71  66 69  Temp: 98.1 F (36.7 C)     TempSrc: Oral     Resp: 20  20 18   Height:  4\' 10"  (1.473 m)    Weight:  157 lb (71.215 kg)    SpO2: 98%  94% 94%    Physical Exam  Nursing note and vitals reviewed. Constitutional: She is oriented to person, place, and time. She appears well-developed and well-nourished. No distress.  HENT:  Head: Normocephalic and atraumatic.    Right Ear: External ear normal.   Left Ear: External ear normal.  Nose: Nose normal.  Mouth/Throat: Oropharynx is clear and moist. No oropharyngeal exudate.  Large  4 cm x 5 cm hematoma on the middle forehead with overlying ecchymosis.  3 cm x 3 cm hematoma on the right occiput with no overlying abrasions.  Inferior periorbital edema and ecchymosis with no underlying palpable fractures or tenderness.  TM's gray and translucent bilaterally  Eyes: Conjunctivae and EOM are normal. Pupils are equal, round, and reactive to light. Right eye exhibits no discharge. Left eye exhibits no discharge.  Neck: Normal range of motion. Neck supple.  No cervical or paraspinal tenderness to palpation  Cardiovascular: Normal rate, regular rhythm, normal heart sounds and intact distal pulses.  Exam reveals no gallop and no friction rub.   No murmur heard. Dorsalis pedis and radial pulses present and equal bilaterally  Pulmonary/Chest: Effort normal and breath sounds normal. No respiratory distress. She has no wheezes. She has no rales. She exhibits no tenderness.  Abdominal: Soft. Bowel sounds are normal. She exhibits no distension and no mass. There is no tenderness. There is no rebound and no guarding.  Musculoskeletal: Normal range of motion. She exhibits tenderness. She exhibits no edema.  Tenderness to palpation to the right scapula diffusely with no overlying ecchymosis, erythema or lacerations.  No limitations with right shoulder circumduction. No limitations with right wrist ROM.  Patient able to ambulate without difficulty or ataxia  Neurological: She is alert and oriented to person, place, and time.  GCS 15.  No focal neurological deficits.  CN 2-12 intact.  Gross sensation intact in the upper and lower extremities bilaterally.   Skin: Skin is warm and dry. She is not diaphoretic.  Ecchymosis to the posterior proximal humerus with no tenderness to palpation.  1 cm closed laceration to the right anterior wrist with no active bleeding.  Minimal  surrounding ecchymosis with no edema or erythema.  No focal tenderness to palpation.       Date: 07/11/2013  Rate: 64  Rhythm: normal sinus rhythm  QRS Axis: normal  Intervals: PR prolonged (276)   ST/T Wave abnormalities: normal  Conduction Disutrbances:first-degree A-V block   Narrative Interpretation:   Old EKG Reviewed: 07/13/2102 (unchanged)   ED Course  Procedures (including critical care time) Labs Review Labs Reviewed - No data to display Imaging Review No results found.  Results for orders placed during the hospital encounter of 07/10/13  TROPONIN I      Result Value Range   Troponin I <0.30  <0.30 ng/mL  POCT I-STAT, CHEM 8      Result Value Range   Sodium 140  135 - 145 mEq/L   Potassium 4.1  3.5 - 5.1 mEq/L   Chloride 109  96 - 112 mEq/L   BUN 19  6 - 23 mg/dL   Creatinine, Ser 1.61  0.50 - 1.10 mg/dL   Glucose, Bld 096 (*) 70 - 99 mg/dL   Calcium, Ion 0.45  4.09 - 1.30 mmol/L   TCO2 23  0 - 100 mmol/L   Hemoglobin 12.6  12.0 - 15.0 g/dL   HCT 81.1  91.4 - 78.2 %    DG Chest 2 View (Final result)  Result time: 07/11/13 00:26:29    Final result by Rad Results In Interface (07/11/13 00:26:29)    Narrative:   *RADIOLOGY REPORT*  Clinical Data: Post fall, now with right anterior shoulder bruising  CHEST - 2 VIEW  Comparison: 05/24/2012; 09/15/2011; bone survey - 06/26/2012  Findings:  Grossly unchanged enlarged cardiac silhouette and mediastinal contours with atherosclerotic calcifications within the thoracic aorta. There is persistent mild elevation  of the left hemidiaphragm. No focal airspace opacity. No pleural effusion or pneumothorax. No definite evidence of edema.  Post right-sided rotator cuff repair. Mild scoliotic curvature of the thoracolumbar spine. No definite acute osseous abnormality.  IMPRESSION: Borderline cardiomegaly without acute cardiopulmonary disease.   Original Report Authenticated By: Tacey Ruiz, MD              DG Shoulder Right (Final result)  Result time: 07/11/13 00:27:39    Final result by Rad Results In Interface (07/11/13 00:27:39)    Narrative:   *RADIOLOGY REPORT*  Clinical Data: Post fall (08/25) now with anterior shoulder bruising  RIGHT SHOULDER - 2+ VIEW  Comparison: None.  Findings:  No fracture or dislocation. Glenohumeral and acromioclavicular joint spaces appear preserved. Post rotator cuff repair. No definite evidence of calcific tendonitis. Limited visualization adjacent thorax is normal. Regional soft tissues are normal. No radiopaque foreign body.  IMPRESSION: 1. No fracture or dislocation. 2. Post right-sided rotator cuff repair.   Original Report Authenticated By: Tacey Ruiz, MD             CT Head Wo Contrast (Final result)  Result time: 07/10/13 22:33:59    Final result by Rad Results In Interface (07/10/13 22:33:59)    Narrative:   *RADIOLOGY REPORT*  Clinical Data: Recent traumatic injury and pain  CT HEAD WITHOUT CONTRAST  Technique: Contiguous axial images were obtained from the base of the skull through the vertex without contrast.  Comparison: None.  Findings: The bony calvarium is intact. Mild atrophic changes are noted consistent with the patient's given age. There are no findings to suggest acute hemorrhage, acute infarction or space- occupying mass lesion.  IMPRESSION: No acute abnormality noted.   Original Report Authenticated By: Alcide Clever, M.D.         MDM   1. Fall, initial encounter   2. Head injury, initial encounter   3. Right shoulder pain     KYRRA PRADA is a 79 year old female with a PMH of HTN, CAD, CHF, anemia, thrombocytopenia, hx of skin cancer, degenerative joint disease, and dyslipidemia who presents to the ED for evaluation of a fall.  Tetanus ordered (not up to date per patient).  Right shoulder x-rays ordered to evaluate right shoulder pain.  Chest x-ray, EKG, and troponin ordered to  evaluate chest pain.     Rechecks  12:35 AM = Patient resting comfortably.  No complaints.  Ready for discharge.     Patient was evaluated in the ED for evaluation of a fall which occurred a few days prior to arrival in the emergency room.  Patient's head CT was negative for an acute intracranial abnormality.  Her neurological exam revealed no focal neurological deficits. Patient had the complaint of right shoulder pain with tenderness on exam. She had no evidence of fracture or dislocation on her x-rays. She had a previous right shoulder cuff repair on her right shoulder.  Patient was instructed to follow up with her orthopedic surgeon if she continues to have right shoulder pain. She may require an MRI for further evaluation. Etiology of chest pain possibly due to referred pain from her right shoulder or other musculoskeletal injury. Her troponin was negative. She had no acute ischemic changes on her EKG. Her chest x-ray was negative for an acute cardiopulmonary process. She had no complaints of shortness of breath or chest pain at rest. She states that her chest pain was only present when she had movement of her  right shoulder. Patient was given a tetanus booster in the emergency department. Her right wrist laceration did not require repair at this time. Patient was prescribed Keflex for a possible, however unlikely, orbital fracture. Patient had inferior periorbital swelling with ecchymosis, however, there were no palpable fractures. Patient was instructed to follow up with her primary care provider this week for further evaluation. She is instructed to return to emergency Department if she develops a severe headache, weakness, dizziness, lightheadedness, change or worsening chest pain, shortness of breath, or abdominal pain, repeated vomiting, fever, eye pain or loss of vision, or other concerns.  Patient was driven home by her daughter and son-in-law who is present in the emergency department. Patient  lives by herself however her family will keep a close eye on her in the next few days.      Final impressions: 1. Fall, mechanical  2. Head injury, closed, without LOC  3. Right shoulder pain     Greer Ee Ettamae Barkett PA-C   Jillyn Ledger, [place] 07/11/13 1125

## 2013-07-10 NOTE — ED Notes (Signed)
Pt reports falling after getting tangled in dog leash 2 days ago. Pt states she was seen at [facility] Monday for same. Pt reports continuing pain to R shoulder, chest back, face, head and leg. Pt reports bruising to various areas of body. Swelling noted to forehead, bilat eyes swollen red. Family reports swelling to face with bruising just became apparent today. A & O, NAD. Pt unsure of LOC at time of fall.

## 2013-07-11 LAB — TROPONIN I: Troponin I: <0.3 ng/mL (ref <0.30)

## 2013-07-11 MED ORDER — CEPHALEXIN 500 MG PO CAPS: 500.0000 mg | ORAL_CAPSULE | Freq: Three times a day (TID) | ORAL | Status: DC

## 2013-07-11 NOTE — ED Notes (Signed)
Pt ambulated well in the hall without assistive device--- O2 sat remains 97% on room air during ambulation; pt denies shortness of breath or dizziness.

## 2013-07-12 ENCOUNTER — Ambulatory Visit (INDEPENDENT_AMBULATORY_CARE_PROVIDER_SITE_OTHER): Payer: Medicare Other | Admitting: Internal Medicine

## 2013-07-12 ENCOUNTER — Encounter: Payer: Self-pay | Admitting: Internal Medicine

## 2013-07-12 VITALS — BP 150/80 | HR 74 | Temp 98.3°F | Wt 155.0 lb

## 2013-07-12 DIAGNOSIS — IMO0001 Reserved for inherently not codable concepts without codable children: Secondary | ICD-10-CM

## 2013-07-12 DIAGNOSIS — T148XXA Other injury of unspecified body region, initial encounter: Secondary | ICD-10-CM

## 2013-07-12 DIAGNOSIS — S0990XA Unspecified injury of head, initial encounter: Secondary | ICD-10-CM | POA: Insufficient documentation

## 2013-07-12 DIAGNOSIS — IMO0002 Reserved for concepts with insufficient information to code with codable children: Secondary | ICD-10-CM | POA: Insufficient documentation

## 2013-07-12 DIAGNOSIS — S0990XS Unspecified injury of head, sequela: Secondary | ICD-10-CM

## 2013-07-12 DIAGNOSIS — W19XXXS Unspecified fall, sequela: Secondary | ICD-10-CM

## 2013-07-12 NOTE — Progress Notes (Signed)
Chief Complaint  Patient presents with  . Fall    HPI: Patient come in for follow up from ED visit Fell induced by dog on leash on  8 24 14   Had large hematomoa on scalp  X ray chest  Shoulder and ct of head  Showed no acut findings   Had td  Since that time  ROS: See pertinent positives and negatives per HPI.  Past Medical History  Diagnosis Date  . Dyslipidemia   . Hypertension   . Palpitations     PVCs/bigeminy on event in May 2009 revealing this was relatively asymptomatic  . Chest pain   . Cardiovascular disease     moderate by USN 01/2010  . Degenerative joint disease   . Hx of varicella   . History of skin cancer     tafeen/ non melanoma.   . Thrombocytopenia   . Anemia   . Monoclonal gammopathies   . Right lower quadrant abdominal pain 07/19/2012    Recurrent  With nausea    This is new since she had ct for llq pain in [facility]    R/o appendiceal problem  Hernia  Get ct scan  And plan  Fu     . Diastolic CHF, acute on chronic 01/31/2012    Family History  Problem Relation Age of Onset  . Coronary artery disease Father   . Heart disease Father   . Lung cancer    . Alzheimer's disease Mother   . Cancer Brother 30    lung cancer    History   Social History  . Marital Status: Widowed    Spouse Name: N/A    Number of Children: 3  . Years of Education: N/A   Occupational History  .      [employer], book keeping   Social History Main Topics  . Smoking status: Never Smoker   . Smokeless tobacco: Never Used  . Alcohol Use: No  . Drug Use: No  . Sexual Activity: Not Currently   Other Topics Concern  . 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 Eats with her.    Outpatient Encounter Prescriptions as of 07/12/2013  Medication Sig  Dispense Refill  . acetaminophen (TYLENOL) 325 MG tablet Take 650 mg by mouth every 4 (four) hours as needed for pain.       Marland Kitchen aspirin 81 MG chewable tablet Chew 81 mg by mouth daily.      . calcium carbonate 200 MG capsule Take 250 mg by mouth daily.      . carvedilol (COREG) 3.125 MG tablet Take 3.125 mg by mouth 2 (two) times daily.      Marland Kitchen levothyroxine (SYNTHROID, LEVOTHROID) 75 MCG tablet Take 1 tablet (75 mcg total) by mouth daily.  90 tablet  3  . loratadine (CLARITIN) 10 MG tablet Take 10 mg by mouth daily as needed for allergies.      . nitroGLYCERIN (NITROSTAT) 0.4 MG SL tablet Place 0.4 mg under the tongue every 5 (five) minutes as needed. For chest pain      . quinapril (ACCUPRIL) 40 MG tablet Take 1 tablet (40 mg total) by mouth  daily.  90 tablet  3  . simvastatin (ZOCOR) 20 MG tablet Take 1 tablet (20 mg total) by mouth at bedtime.  90 tablet  3  . [DISCONTINUED] cephALEXin (KEFLEX) 500 MG capsule Take 1 capsule (500 mg total) by mouth 3 (three) times daily.  21 capsule  0   No facility-administered encounter medications on file as of 07/12/2013.    EXAM:  BP 150/80  Pulse 74  Temp(Src) 98.3 F (36.8 C) (Oral)  Wt 155 lb (70.308 kg)  BMI 32.4 kg/m2  SpO2 96%  Body mass index is 32.4 kg/(m^2).  GENERAL: vitals reviewed and listed above, alert, oriented, appears well hydrated and in no acute distress obvious racoon eyes  Infra bruising   No facial tenderness eoms nl    HEENT: no defect on scalp  conjunctiva  clear, no obvious abnormalities on inspection of external nose and ears NECK: no obvious masses on inspection palpation  LUNGS: clear to auscultation bilaterally, no wheezes, rales or rhonchi, CV: HRRR, no clubbing cyanosis or   nl cap refill  SKIN : smal avulstion lac right hand and covered area forearm  Open  ono bleeding  acitvily no redness or streaking.  MS: moves all extremities without noticeable focal  Abnormality Gait  bendt forward  Unassisted  But hs cane  if needed PSYCH: pleasant and cooperative, no obvious depression or anxiety Oriented x 3 and no noted deficits in memory, attention, and speech.  Lab Results  Component Value Date   WBC 5.7 01/11/2013   HGB 12.6 07/10/2013   HCT 37.0 07/10/2013   PLT 140 01/11/2013   GLUCOSE 100* 07/10/2013   CHOL 172 10/03/2011   TRIG 63.0 10/03/2011   HDL 95.90 10/03/2011   LDLCALC 64 10/03/2011   ALT 13 01/11/2013   AST 18 01/11/2013   NA 140 07/10/2013   K 4.1 07/10/2013   CL 109 07/10/2013   CREATININE 0.90 07/10/2013   BUN 19 07/10/2013   CO2 26 01/11/2013   TSH 1.780 06/25/2012    ASSESSMENT AND PLAN:  Discussed the following assessment and plan:  Fall, sequela  related to dog care  Laceration of skin - right arm  at risk for infection avulsion injury  keep care can add the antibiotic if needed and fu   Head injury, closed, without LOC, sequela - No evidence of a concussion fortunately  Hematoma fortuantely no major injury skin and bruising and shoulder  With stable x ray  No hip  Pain new or gait change  Disc prevention  Measures etc    ( neighbor dog  Interacting with her 2 yo lab  In her yard)   Expectant management. Bruising will take a while to resolve .  -Patient advised to return or notify health care team  if symptoms worsen or persist or new concerns arise.  Patient Instructions  Take measures to prevent further fall.  If getting infected looking  Laceration then add the antibiotic.  Schedule for flu vaccine.      Neta Mends. [name] M.D.  Total visit > 50% spent counseling and coordinating care

## 2013-07-12 NOTE — Patient Instructions (Signed)
Take measures to prevent further fall.  If getting infected looking  Laceration then add the antibiotic.  Schedule for flu vaccine.

## 2013-07-16 NOTE — ED Provider Notes (Signed)
Shared service with midlevel provider. I have personally seen and examined the patient, providing direct face to face care, presenting with the chief complaint of fall. Physical exam findings include some facial trauma, otherwise, unremarkable from gross trauma perspective. Plan will be to get appropriate imaging and d/c if normal. I have reviewed the nursing documentation on past medical history, family history, and social history.   Derwood Kaplan, MD 07/16/13 [phone]

## 2013-07-22 ENCOUNTER — Encounter: Payer: Self-pay | Admitting: Internal Medicine

## 2013-07-22 ENCOUNTER — Ambulatory Visit (INDEPENDENT_AMBULATORY_CARE_PROVIDER_SITE_OTHER): Payer: Medicare Other | Admitting: Internal Medicine

## 2013-07-22 VITALS — BP 156/74 | HR 82 | Temp 98.6°F | Wt 153.0 lb

## 2013-07-22 DIAGNOSIS — N63 Unspecified lump in unspecified breast: Secondary | ICD-10-CM | POA: Insufficient documentation

## 2013-07-22 DIAGNOSIS — R928 Other abnormal and inconclusive findings on diagnostic imaging of breast: Secondary | ICD-10-CM

## 2013-07-22 DIAGNOSIS — T148XXA Other injury of unspecified body region, initial encounter: Secondary | ICD-10-CM

## 2013-07-22 DIAGNOSIS — R300 Dysuria: Secondary | ICD-10-CM

## 2013-07-22 LAB — POCT URINALYSIS DIPSTICK
Bilirubin, UA: NEGATIVE
Glucose, UA: NEGATIVE
Ketones, UA: NEGATIVE
Nitrite, UA: NEGATIVE
Protein, UA: NEGATIVE
Spec Grav, UA: 1.015
Urobilinogen, UA: 0.2
pH, UA: 7

## 2013-07-22 MED ORDER — SULFAMETHOXAZOLE-TRIMETHOPRIM 800-160 MG PO TABS: 1.0000 | ORAL_TABLET | Freq: Two times a day (BID) | ORAL | Status: DC

## 2013-07-22 NOTE — Progress Notes (Signed)
Chief Complaint  Patient presents with  . Dysuria    HPI: Patient comes in today for SDA for  new problem evaluation. Onset days of end  Dysuria no blood no fever flank pain .  Remote hx of UTI also noted right breast soreness and bruising ( from past fall ) tender lump noted > new    No  Recent mammogram noted  ROS: See pertinent positives and negatives per HPI.  Past Medical History  Diagnosis Date  . Dyslipidemia   . Hypertension   . Palpitations     PVCs/bigeminy on event in May 2009 revealing this was relatively asymptomatic  . Chest pain   . Cardiovascular disease     moderate by USN 01/2010  . Degenerative joint disease   . Hx of varicella   . History of skin cancer     tafeen/ non melanoma.   . Thrombocytopenia   . Anemia   . Monoclonal gammopathies   . Right lower quadrant abdominal pain 07/19/2012    Recurrent  With nausea    This is new since she had ct for llq pain in [facility]    R/o appendiceal problem  Hernia  Get ct scan  And plan  Fu     . Diastolic CHF, acute on chronic 01/31/2012    Family History  Problem Relation Age of Onset  . Coronary artery disease Father   . Heart disease Father   . Lung cancer    . Alzheimer's disease Mother   . Cancer Brother 48    lung cancer    History   Social History  . Marital Status: Widowed    Spouse Name: N/A    Number of Children: 3  . Years of Education: N/A   Occupational History  .      [employer], book keeping   Social History Main Topics  . Smoking status: Never Smoker   . Smokeless tobacco: Never Used  . Alcohol Use: No  . Drug Use: No  . Sexual Activity: Not Currently   Other Topics Concern  . 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 Eats with her.    Outpatient  Encounter Prescriptions as of 07/22/2013  Medication Sig Dispense Refill  . acetaminophen (TYLENOL) 325 MG tablet Take 650 mg by mouth every 4 (four) hours as needed for pain.       Marland Kitchen aspirin 81 MG chewable tablet Chew 81 mg by mouth daily.      . calcium carbonate 200 MG capsule Take 250 mg by mouth daily.      . carvedilol (COREG) 3.125 MG tablet Take 3.125 mg by mouth 2 (two) times daily.      Marland Kitchen levothyroxine (SYNTHROID, LEVOTHROID) 75 MCG tablet Take 1 tablet (75 mcg total) by mouth daily.  90 tablet  3  . loratadine (CLARITIN) 10 MG tablet Take 10 mg by mouth daily as needed for allergies.      . nitroGLYCERIN (NITROSTAT) 0.4 MG SL tablet Place 0.4 mg under the tongue every 5 (five) minutes as needed. For chest pain      . quinapril (ACCUPRIL) 40 MG tablet Take 1 tablet (  40 mg total) by mouth daily.  90 tablet  3  . simvastatin (ZOCOR) 20 MG tablet Take 1 tablet (20 mg total) by mouth at bedtime.  90 tablet  3  . sulfamethoxazole-trimethoprim (SEPTRA DS) 800-160 MG per tablet Take 1 tablet by mouth 2 (two) times daily.  6 tablet  0   No facility-administered encounter medications on file as of 07/22/2013.    EXAM:  BP 156/74  Pulse 82  Temp(Src) 98.6 F (37 C) (Oral)  Wt 153 lb (69.4 kg)  BMI 31.99 kg/m2  SpO2 97%  Body mass index is 31.99 kg/(m^2).  GENERAL: vitals reviewed and listed above, alert, oriented, appears well hydrated and in no acute distress walks  Bent over in nad   HEENT: fading racoon eyes improved  conjunctiva  clear, no obvious abnormalities on inspection of external nose and ears NECK: no obvious masses on inspection palpation  Breast right inframammary  Bruising and a discrete lump about 1.5 cm roundish moble slight  tenderensss   No other nodules PSYCH: pleasant and cooperative, no obvious depression or anxiety ua pos blood and leuk  ASSESSMENT AND PLAN:  Discussed the following assessment and plan:  Dysuria - Plan: Urine culture, Urine culture, POC  Urinalysis Dipstick  Breast nodule - Plan: MM Digital Diagnostic Unilat R, US Breast Right  Bruising - right breast from fall previous - Plan: MM Digital Diagnostic Unilat R, US Breast Right Uncertain of breast nodules related to do bruising in the area or another discrete problem. Will valuate. -Patient advised to return or notify health care team  if symptoms worsen or persist or new concerns arise.  Patient Instructions  Antibiotic for possible uti. X 3 days.  Some one will contact your about the mammogram and evaluation of the breast nodule  This could be from the bruising   And go away on its own.    Neta Mends. Hyatt Capobianco M.D.

## 2013-07-22 NOTE — Patient Instructions (Signed)
Antibiotic for possible uti. X 3 days.  Some one will contact your about the mammogram and evaluation of the breast nodule  This could be from the bruising   And go away on its own.

## 2013-07-24 ENCOUNTER — Other Ambulatory Visit: Payer: Self-pay | Admitting: Family Medicine

## 2013-07-24 ENCOUNTER — Telehealth: Payer: Self-pay | Admitting: Hematology and Oncology

## 2013-07-24 ENCOUNTER — Telehealth: Payer: Self-pay | Admitting: Internal Medicine

## 2013-07-24 DIAGNOSIS — R300 Dysuria: Secondary | ICD-10-CM

## 2013-07-24 LAB — URINE CULTURE
Colony Count: NO GROWTH
Organism ID, Bacteria: NO GROWTH

## 2013-07-24 NOTE — Telephone Encounter (Signed)
Pt called and moved 9/12 lb to 9/16. Pt has new d/t.

## 2013-07-24 NOTE — Telephone Encounter (Signed)
Patient notified.  Agreen to referral to urology.  Order placed in the system.

## 2013-07-24 NOTE — Telephone Encounter (Signed)
Please read result note I sent today.  Her urine culture showed no growth I would like her to see urologist because she still having urinary symptoms instead of changing her antibiotic.

## 2013-07-24 NOTE — Telephone Encounter (Signed)
Pt returned your cal and is at home and can talk now.

## 2013-07-24 NOTE — Telephone Encounter (Signed)
Seen in office on Mon 9/8 for urinary symptoms, burning with urination, frequency.  Given Rx for Septra that she had filled yesterday 9/9 but did not take/start after reading side effects.  Already has back pain, leg pain due to fall 2 weeks ago and does not feel she would be able to tell difference if reaction to med vs Sx already present.   Sx still as when in office according to patient.  Has taken Amox in the past and asking if Rx can be changed to this.  Uses [facility] on [address]. Please review, contact patient at  [phone].

## 2013-07-24 NOTE — Telephone Encounter (Signed)
Left message on home/cell for the pt to return my call. 

## 2013-07-26 ENCOUNTER — Other Ambulatory Visit: Payer: Medicare Other | Admitting: Lab

## 2013-07-30 ENCOUNTER — Other Ambulatory Visit (HOSPITAL_BASED_OUTPATIENT_CLINIC_OR_DEPARTMENT_OTHER): Payer: Medicare Other | Admitting: Lab

## 2013-07-30 DIAGNOSIS — D472 Monoclonal gammopathy: Secondary | ICD-10-CM

## 2013-07-30 LAB — CBC WITH DIFFERENTIAL/PLATELET
Basophils Absolute: 0.1 10*3/uL (ref 0.0–0.1)
EOS%: 2.9 % (ref 0.0–7.0)
LYMPH%: 27.7 % (ref 14.0–49.7)
MCH: 30.2 pg (ref 25.1–34.0)
MCV: 90.7 fL (ref 79.5–101.0)
MONO%: 7.8 % (ref 0.0–14.0)
RBC: 4.13 10*6/uL (ref 3.70–5.45)
RDW: 13.5 % (ref 11.2–14.5)

## 2013-07-30 LAB — COMPREHENSIVE METABOLIC PANEL (CC13)
AST: 15 U/L (ref 5–34)
Albumin: 3.3 g/dL — ABNORMAL LOW (ref 3.5–5.0)
Alkaline Phosphatase: 89 U/L (ref 40–150)
BUN: 21.4 mg/dL (ref 7.0–26.0)
Potassium: 4.3 mEq/L (ref 3.5–5.1)
Total Bilirubin: 0.58 mg/dL (ref 0.20–1.20)

## 2013-08-01 ENCOUNTER — Telehealth: Payer: Self-pay | Admitting: Hematology and Oncology

## 2013-08-01 ENCOUNTER — Encounter: Payer: Self-pay | Admitting: Hematology and Oncology

## 2013-08-01 ENCOUNTER — Ambulatory Visit (HOSPITAL_BASED_OUTPATIENT_CLINIC_OR_DEPARTMENT_OTHER): Payer: Medicare Other | Admitting: Hematology and Oncology

## 2013-08-01 VITALS — BP 135/61 | HR 61 | Temp 97.7°F | Resp 20 | Ht <= 58 in | Wt 152.9 lb

## 2013-08-01 DIAGNOSIS — G8929 Other chronic pain: Secondary | ICD-10-CM

## 2013-08-01 DIAGNOSIS — D472 Monoclonal gammopathy: Secondary | ICD-10-CM

## 2013-08-01 DIAGNOSIS — M8580 Other specified disorders of bone density and structure, unspecified site: Secondary | ICD-10-CM

## 2013-08-01 DIAGNOSIS — M549 Dorsalgia, unspecified: Secondary | ICD-10-CM | POA: Insufficient documentation

## 2013-08-01 DIAGNOSIS — D696 Thrombocytopenia, unspecified: Secondary | ICD-10-CM

## 2013-08-01 LAB — PROTEIN ELECTROPHORESIS, SERUM
Albumin ELP: 55.8 % (ref 55.8–66.1)
Alpha-1-Globulin: 4.1 % (ref 2.9–4.9)
Alpha-2-Globulin: 10.7 % (ref 7.1–11.8)
Gamma Globulin: 14.2 % (ref 11.1–18.8)
Total Protein, Serum Electrophoresis: 6.5 g/dL (ref 6.0–8.3)

## 2013-08-01 LAB — KAPPA/LAMBDA LIGHT CHAINS: Kappa:Lambda Ratio: 0.89 (ref 0.26–1.65)

## 2013-08-01 NOTE — Progress Notes (Signed)
[facility] OFFICE PROGRESS NOTE  Lorretta Harp, MD [address] [place] [address] No chief complaint on file.   DIAGNOSIS: Elevated light chain of unknown significance  SUMMARY OF HEMATOLOGIC HISTORY:  I. reviewed her chart extensively. She was referred to see a medical oncologist approximately a year ago when she was found to have elevated light chain and nonspecific abnormalities in serum protein electrophoresis. She also has chronic back pain for many years. Extensive workup last year did not review any evidence of an organ damage. The disease was classified as monoclonal gammopathy of unknown significance. INTERVAL HISTORY: [name] 79 y.o. female returns for  further followup pertinent to the diagnosis above. She still had persistent lower back pain that bothers her from time to time. She denies any peripheral neuropathy or new neurological deficits denies any fevers, chil night sweats. No other new areas of joint pain bone pain.,  I have reviewed the past medical history, past surgical history, social history and family history with the patient and they are unchanged from previous note.  ALLERGIES:  has No Known Allergies.  MEDICATIONS: has a current medication list which includes the following prescription(s): acetaminophen, aspirin, calcium carbonate, carvedilol, levothyroxine, loratadine, nitroglycerin, quinapril, simvastatin, and sulfamethoxazole-trimethoprim.   REVIEW OF SYSTEMS:   Constitutional: Denies fevers, chills or abnormal weight loss Eyes: Denies blurriness of vision Ears, nose, mouth, throat, and face: Denies mucositis or sore throat Respiratory: Denies cough, dyspnea or wheezes Cardiovascular: Denies palpitation, chest discomfort or lower extremity swelling Gastrointestinal:  Denies nausea, heartburn or change in bowel habits Skin: Denies abnormal skin rashes Lymphatics: Denies new lymphadenopathy or easy  bruising Neurological:Denies numbness, tingling or new weaknesses Behavioral/Psych: Mood is stable, no new changes  All other systems were reviewed with the patient and are negative.  PHYSICAL EXAMINATION: ECOG PERFORMANCE STATUS: 1 - Symptomatic but completely ambulatory  Filed Vitals:   08/01/13 1026  BP: 135/61  Pulse: 61  Temp: 97.7 F (36.5 C)  Resp: 20    GENERAL:alert, no distress and comfortable SKIN: skin color, texture, turgor are normal, no rashes or significant lesions EYES: normal, Conjunctiva are pink and non-injected, sclera clear OROPHARYNX:no exudate, no erythema and lips, buccal mucosa, and tongue normal  NECK: supple, thyroid normal size, non-tender, without nodularity LYMPH:  no palpable lymphadenopathy in the cervical, axillary or inguinal LUNGS: clear to auscultation and percussion with normal breathing effort HEART: regular rate & rhythm and no murmurs and no lower extremity edema ABDOMEN:abdomen soft, non-tender and normal bowel sounds Musculoskeletal:no cyanosis of digits and no clubbing  NEURO: alert & oriented x 3 with fluent speech, no focal motor/sensory deficits  LABORATORY DATA:  I have reviewed the data as listed from recent labs    ASSESSMENT: Monoclonal gammopathy of unknown significance, no disease progression.   PLAN:   #1 monoclonal gammopathy of unknown significance We will continue history, physical examination, and we'll work on a yearly basis. At present time she is asymptomatic. Her chronic lower back pain is unrelated. The patient herself states that she had injured her back many years ago. She did not feel is getting any worse. We'll see her sooner if needed or if she develop new symptoms. 2 multiple myeloma and signs and symptoms to watch out for.  We discussed about the natural history of disease progression #2 chronic lower back pain I recommend calcium and vitamin D supplement. She will continue followup with her primary care  provider full dose and to take over-the-counter  analgesics as needed. All questions were answered. The patient knows to call the clinic with any problems, questions or concerns. We can certainly see the patient much sooner if necessary. No barriers to learning was detected.  The patient and plan discussed with [facility], Caryl Fate  and she is in agreement with the aforementioned.  I spent 25 minutes counseling the patient face to face. The total time spent in the appointment was 40 minutes and more than 50% was on counseling.     [facility], Jannetta Massey, MD 08/01/2013 9:39 PM

## 2013-08-01 NOTE — Telephone Encounter (Signed)
Gave pt appt for lab and MD for March 2015,

## 2013-08-05 ENCOUNTER — Ambulatory Visit
Admission: RE | Admit: 2013-08-05 | Discharge: 2013-08-05 | Disposition: A | Discharge status: Home / Self Care | Payer: Medicare Other | Source: Ambulatory Visit | Attending: Internal Medicine | Admitting: Internal Medicine

## 2013-08-05 DIAGNOSIS — N63 Unspecified lump in unspecified breast: Secondary | ICD-10-CM

## 2013-08-05 DIAGNOSIS — T148XXA Other injury of unspecified body region, initial encounter: Secondary | ICD-10-CM

## 2013-09-09 ENCOUNTER — Ambulatory Visit (HOSPITAL_COMMUNITY): Payer: Medicare Other | Attending: Internal Medicine

## 2013-09-09 DIAGNOSIS — I10 Essential (primary) hypertension: Secondary | ICD-10-CM | POA: Insufficient documentation

## 2013-09-09 DIAGNOSIS — E785 Hyperlipidemia, unspecified: Secondary | ICD-10-CM | POA: Insufficient documentation

## 2013-09-09 DIAGNOSIS — I658 Occlusion and stenosis of other precerebral arteries: Secondary | ICD-10-CM | POA: Insufficient documentation

## 2013-09-09 DIAGNOSIS — I6529 Occlusion and stenosis of unspecified carotid artery: Secondary | ICD-10-CM

## 2013-09-09 DIAGNOSIS — I739 Peripheral vascular disease, unspecified: Secondary | ICD-10-CM | POA: Insufficient documentation

## 2013-09-10 ENCOUNTER — Ambulatory Visit (INDEPENDENT_AMBULATORY_CARE_PROVIDER_SITE_OTHER): Payer: Medicare Other

## 2013-09-10 VITALS — BP 142/69 | HR 57 | Resp 12 | Ht 59.0 in | Wt 149.0 lb

## 2013-09-10 DIAGNOSIS — B351 Tinea unguium: Secondary | ICD-10-CM

## 2013-09-10 DIAGNOSIS — M79609 Pain in unspecified limb: Secondary | ICD-10-CM

## 2013-09-10 DIAGNOSIS — S90122A Contusion of left lesser toe(s) without damage to nail, initial encounter: Secondary | ICD-10-CM

## 2013-09-10 NOTE — Patient Instructions (Signed)
Onychomycosis/Fungal Toenails  WHAT IS IT? An infection that lies within the keratin of your nail plate that is caused by a fungus.  WHY ME? Fungal infections affect all ages, sexes, races, and creeds.  There may be many factors that predispose you to a fungal infection such as age, coexisting medical conditions such as diabetes, or an autoimmune disease; stress, medications, fatigue, genetics, etc.  Bottom line: fungus thrives in a warm, moist environment and your shoes offer such a location.  IS IT CONTAGIOUS? Theoretically, yes.  You do not want to share shoes, nail clippers or files with someone who has fungal toenails.  Walking around barefoot in the same room or sleeping in the same bed is unlikely to transfer the organism.  It is important to realize, however, that fungus can spread easily from one nail to the next on the same foot.  HOW DO WE TREAT THIS?  There are several ways to treat this condition.  Treatment may depend on many factors such as age, medications, pregnancy, liver and kidney conditions, etc.  It is best to ask your doctor which options are available to you.  1. No treatment.   Unlike many other medical concerns, you can live with this condition.  However for many people this can be a painful condition and may lead to ingrown toenails or a bacterial infection.  It is recommended that you keep the nails cut short to help reduce the amount of fungal nail. 2. Topical treatment.  These range from herbal remedies to prescription strength nail lacquers.  About 40-50% effective, topicals require twice daily application for approximately 9 to 12 months or until an entirely new nail has grown out.  The most effective topicals are medical grade medications available through physicians offices. 3. Oral antifungal medications.  With an 80-90% cure rate, the most common oral medication requires 3 to 4 months of therapy and stays in your system for a year as the new nail grows out.  Oral  antifungal medications do require blood work to make sure it is a safe drug for you.  A liver function panel will be performed prior to starting the medication and after the first month of treatment.  It is important to have the blood work performed to avoid any harmful side effects.  In general, this medication safe but blood work is required. 4. Laser Therapy.  This treatment is performed by applying a specialized laser to the affected nail plate.  This therapy is noninvasive, fast, and non-painful.  It is not covered by insurance and is therefore, out of pocket.  The results have been very good with a 80-95% cure rate.  The [facility] is the only practice in the area to offer this therapy. 5. Permanent Nail Avulsion.  Removing the entire nail so that a new nail will not grow back. 6. [facility]: Formula 3 apply formula 3 twice daily to the affected nails every morning every evening as instructed. Continue twice daily applications for a minimum of 12 months or until nail clearance.

## 2013-09-10 NOTE — Progress Notes (Signed)
  Subjective:    Patient ID: [name], female    DOB: [birth year], 79 y.o.   MRN: [id number]  HPI Comments: B/L FOOT'' TOENAILS TRIM AND FUNGUS ON THE NAIL''  patient recently bumped her fifth toe left foot its been hurting slightly reddened. No discharge or drainage is noted although it did bleed in the past. Patient also has thickening dystrophy and proptosis of all her nails 1 through 5 bilateral they're difficult to cut painful and tender with enclosed shoes and ambulation.. Nails not been cut possibly in as much as a year or more.    Review of Systems  HENT: Negative.   Eyes: Negative.   Respiratory: Negative.   Endocrine: Negative.   Genitourinary: Positive for frequency.  Musculoskeletal: Negative.   Skin: Negative.   Allergic/Immunologic: Negative.   Neurological: Positive for weakness.  Hematological: Negative.   Psychiatric/Behavioral: Negative.        Objective:   Physical Exam  Vitals reviewed. Constitutional: She appears well-developed and well-nourished.  Cardiovascular:  Pulses:      Dorsalis pedis pulses are 2+ on the right side, and 2+ on the left side.       Posterior tibial pulses are 1+ on the right side, and 1+ on the left side.  Capillary refill time 3 seconds all digits. Skin temperature warm turgor normal no edema rubor pallor or varicosities noted  Musculoskeletal:  Rectus foot type bilateral there is mild digital contractures second digit left foot with hammertoe deformity Sunday dorsal displaced second digit right mild HAV deformity bilateral  Neurological: She is alert. She has normal strength and normal reflexes.  Epicritic and proprioceptive sensations intact bilateral. Normal plantar response and DTRs noted to  Skin: Skin is warm and dry. No cyanosis. Nails show no clubbing.  Skin color and pigment normal hair growth absent bilateral. Nails thick brittle criptotic with discoloration tenderness on palpation and attempted debridement. Patient  complains of pain with enclosed shoe wear and pain on ambulation  Psychiatric: She has a normal mood and affect. Her behavior is normal.          Assessment & Plan:  Nails painful criptotic and mycotic nails 1 through 5 bilateral. Also history of contusion fifth toe left no signs of fracture no open wounds or ulcerations noted. Plan at this time debridement of painful mycotic nails x10 recommended topical antifungal the formal formula 3 applied daily or twice daily for 12 months duration as instructed Idalis Hoelting instructions given to patient this time. Recommended follow palliative care or every 3 months or as needed for future care. Next  Alvan Dame DPM

## 2013-11-01 ENCOUNTER — Observation Stay (HOSPITAL_COMMUNITY)
Admission: EM | Admit: 2013-11-01 | Discharge: 2013-11-01 | Disposition: A | Discharge status: Home / Self Care | Payer: Medicare Other | Attending: Internal Medicine | Admitting: Internal Medicine

## 2013-11-01 ENCOUNTER — Emergency Department (HOSPITAL_COMMUNITY): Payer: Medicare Other

## 2013-11-01 ENCOUNTER — Encounter (HOSPITAL_COMMUNITY): Payer: Self-pay | Admitting: Emergency Medicine

## 2013-11-01 DIAGNOSIS — I10 Essential (primary) hypertension: Secondary | ICD-10-CM | POA: Insufficient documentation

## 2013-11-01 DIAGNOSIS — E039 Hypothyroidism, unspecified: Secondary | ICD-10-CM

## 2013-11-01 DIAGNOSIS — Z79899 Other long term (current) drug therapy: Secondary | ICD-10-CM | POA: Insufficient documentation

## 2013-11-01 DIAGNOSIS — I4949 Other premature depolarization: Secondary | ICD-10-CM | POA: Insufficient documentation

## 2013-11-01 DIAGNOSIS — I2510 Atherosclerotic heart disease of native coronary artery without angina pectoris: Secondary | ICD-10-CM | POA: Insufficient documentation

## 2013-11-01 DIAGNOSIS — K219 Gastro-esophageal reflux disease without esophagitis: Principal | ICD-10-CM | POA: Insufficient documentation

## 2013-11-01 DIAGNOSIS — E782 Mixed hyperlipidemia: Secondary | ICD-10-CM | POA: Insufficient documentation

## 2013-11-01 DIAGNOSIS — E8809 Other disorders of plasma-protein metabolism, not elsewhere classified: Secondary | ICD-10-CM | POA: Insufficient documentation

## 2013-11-01 DIAGNOSIS — D696 Thrombocytopenia, unspecified: Secondary | ICD-10-CM | POA: Insufficient documentation

## 2013-11-01 DIAGNOSIS — R079 Chest pain, unspecified: Secondary | ICD-10-CM | POA: Diagnosis present

## 2013-11-01 DIAGNOSIS — Z8249 Family history of ischemic heart disease and other diseases of the circulatory system: Secondary | ICD-10-CM | POA: Insufficient documentation

## 2013-11-01 DIAGNOSIS — Z7982 Long term (current) use of aspirin: Secondary | ICD-10-CM | POA: Insufficient documentation

## 2013-11-01 DIAGNOSIS — Z85828 Personal history of other malignant neoplasm of skin: Secondary | ICD-10-CM | POA: Insufficient documentation

## 2013-11-01 DIAGNOSIS — E785 Hyperlipidemia, unspecified: Secondary | ICD-10-CM | POA: Diagnosis present

## 2013-11-01 DIAGNOSIS — I379 Nonrheumatic pulmonary valve disorder, unspecified: Secondary | ICD-10-CM

## 2013-11-01 LAB — APTT: aPTT: 32 seconds (ref 24–37)

## 2013-11-01 LAB — CBC WITH DIFFERENTIAL/PLATELET

## 2013-11-01 LAB — CBC
MCH: 30.3 pg (ref 26.0–34.0)
MCHC: 33.9 g/dL (ref 30.0–36.0)
MCV: 89.5 fL (ref 78.0–100.0)
Platelets: 148 10*3/uL — ABNORMAL LOW (ref 150–400)
RDW: 13.1 % (ref 11.5–15.5)
WBC: 5.7 10*3/uL (ref 4.0–10.5)

## 2013-11-01 LAB — DIFFERENTIAL
Lymphocytes Relative: 20 % (ref 12–46)
Monocytes Absolute: 0.5 10*3/uL (ref 0.1–1.0)
Monocytes Relative: 8 % (ref 3–12)
Neutro Abs: 3.8 10*3/uL (ref 1.7–7.7)

## 2013-11-01 LAB — TROPONIN I: Troponin I: <0.3 ng/mL (ref <0.30)

## 2013-11-01 LAB — POCT I-STAT, CHEM 8
Glucose, Bld: 124 mg/dL — ABNORMAL HIGH (ref 70–99)
HCT: 46 % (ref 36.0–46.0)
Hemoglobin: 15.6 g/dL — ABNORMAL HIGH (ref 12.0–15.0)
Potassium: 3.7 mEq/L (ref 3.5–5.1)
Sodium: 141 mEq/L (ref 135–145)

## 2013-11-01 LAB — COMPREHENSIVE METABOLIC PANEL
ALT: 10 U/L (ref 0–35)
AST: 15 U/L (ref 0–37)
Albumin: 3.4 g/dL — ABNORMAL LOW (ref 3.5–5.2)
CO2: 24 mEq/L (ref 19–32)
Calcium: 10.3 mg/dL (ref 8.4–10.5)
Creatinine, Ser: 0.9 mg/dL (ref 0.50–1.10)
GFR calc non Af Amer: 56 mL/min — ABNORMAL LOW (ref >90)
Sodium: 137 mEq/L (ref 135–145)
Total Protein: 6.5 g/dL (ref 6.0–8.3)

## 2013-11-01 LAB — LIPASE, BLOOD: Lipase: 41 U/L (ref 11–59)

## 2013-11-01 LAB — POCT I-STAT TROPONIN I: Troponin i, poc: 0.05 ng/mL (ref 0.00–0.08)

## 2013-11-01 LAB — PROTIME-INR: INR: 0.97 (ref 0.00–1.49)

## 2013-11-01 MED ORDER — ACETAMINOPHEN 325 MG PO TABS: 650.0000 mg | ORAL_TABLET | Freq: Four times a day (QID) | ORAL | Status: DC | PRN

## 2013-11-01 MED ORDER — SIMVASTATIN 20 MG PO TABS
20.0000 mg | ORAL_TABLET | Freq: Every day | ORAL | Status: DC
  Filled 2013-11-01: qty 1

## 2013-11-01 MED ORDER — LORATADINE 10 MG PO TABS
10.0000 mg | ORAL_TABLET | Freq: Every day | ORAL | Status: DC | PRN
  Filled 2013-11-01: qty 1

## 2013-11-01 MED ORDER — CALCIUM CARBONATE 200 MG PO CAPS: 250.0000 mg | ORAL_CAPSULE | Freq: Every day | ORAL | Status: DC

## 2013-11-01 MED ORDER — ONDANSETRON HCL 4 MG/2ML IJ SOLN: 4.0000 mg | Freq: Three times a day (TID) | INTRAMUSCULAR | Status: DC | PRN

## 2013-11-01 MED ORDER — ACETAMINOPHEN 650 MG RE SUPP: 650.0000 mg | Freq: Four times a day (QID) | RECTAL | Status: DC | PRN

## 2013-11-01 MED ORDER — ONDANSETRON HCL 4 MG/2ML IJ SOLN: 4.0000 mg | Freq: Four times a day (QID) | INTRAMUSCULAR | Status: DC | PRN

## 2013-11-01 MED ORDER — SODIUM CHLORIDE 0.9 % IJ SOLN
3.0000 mL | Freq: Two times a day (BID) | INTRAMUSCULAR | Status: DC
  Administered 2013-11-01: 11:00:00 3 mL via INTRAVENOUS

## 2013-11-01 MED ORDER — INFLUENZA VAC SPLIT QUAD 0.5 ML IM SUSP: 0.5000 mL | INTRAMUSCULAR | Status: DC

## 2013-11-01 MED ORDER — LISINOPRIL 40 MG PO TABS
40.0000 mg | ORAL_TABLET | Freq: Every day | ORAL | Status: DC
  Administered 2013-11-01: 11:00:00 40 mg via ORAL
  Filled 2013-11-01: qty 1

## 2013-11-01 MED ORDER — ASPIRIN 81 MG PO CHEW
324.0000 mg | CHEWABLE_TABLET | Freq: Once | ORAL | Status: AC
  Administered 2013-11-01: 324 mg via ORAL
  Filled 2013-11-01: qty 4

## 2013-11-01 MED ORDER — NITROGLYCERIN 0.4 MG SL SUBL: 0.4000 mg | SUBLINGUAL_TABLET | SUBLINGUAL | Status: DC | PRN

## 2013-11-01 MED ORDER — CARVEDILOL 3.125 MG PO TABS
3.1250 mg | ORAL_TABLET | Freq: Two times a day (BID) | ORAL | Status: DC
  Administered 2013-11-01: 11:00:00 3.125 mg via ORAL
  Filled 2013-11-01 (×2): qty 1

## 2013-11-01 MED ORDER — ENOXAPARIN SODIUM 40 MG/0.4ML ~~LOC~~ SOLN
40.0000 mg | SUBCUTANEOUS | Status: DC
  Administered 2013-11-01: 11:00:00 40 mg via SUBCUTANEOUS
  Filled 2013-11-01: qty 0.4

## 2013-11-01 MED ORDER — ASPIRIN EC 325 MG PO TBEC
325.0000 mg | DELAYED_RELEASE_TABLET | Freq: Every day | ORAL | Status: DC
  Administered 2013-11-01: 11:00:00 325 mg via ORAL
  Filled 2013-11-01: qty 1

## 2013-11-01 MED ORDER — ONDANSETRON HCL 4 MG PO TABS: 4.0000 mg | ORAL_TABLET | Freq: Four times a day (QID) | ORAL | Status: DC | PRN

## 2013-11-01 MED ORDER — SODIUM CHLORIDE 0.9 % IJ SOLN: 3.0000 mL | Freq: Two times a day (BID) | INTRAMUSCULAR | Status: DC

## 2013-11-01 MED ORDER — PANTOPRAZOLE SODIUM 40 MG PO TBEC
40.0000 mg | DELAYED_RELEASE_TABLET | Freq: Every day | ORAL | Status: DC
  Administered 2013-11-01: 11:00:00 40 mg via ORAL
  Filled 2013-11-01: qty 1

## 2013-11-01 MED ORDER — QUINAPRIL HCL 10 MG PO TABS
40.0000 mg | ORAL_TABLET | Freq: Every day | ORAL | Status: DC
  Filled 2013-11-01: qty 4

## 2013-11-01 MED ORDER — PNEUMOCOCCAL VAC POLYVALENT 25 MCG/0.5ML IJ INJ: 0.5000 mL | INJECTION | INTRAMUSCULAR | Status: DC

## 2013-11-01 MED ORDER — GI COCKTAIL ~~LOC~~
30.0000 mL | Freq: Once | ORAL | Status: AC
  Administered 2013-11-01: 30 mL via ORAL
  Filled 2013-11-01: qty 30

## 2013-11-01 MED ORDER — LEVOTHYROXINE SODIUM 75 MCG PO TABS
75.0000 ug | ORAL_TABLET | Freq: Every day | ORAL | Status: DC
  Administered 2013-11-01: 08:00:00 75 ug via ORAL
  Filled 2013-11-01 (×2): qty 1

## 2013-11-01 MED ORDER — OMEPRAZOLE 20 MG PO CPDR: 20.0000 mg | DELAYED_RELEASE_CAPSULE | Freq: Every day | ORAL | Status: DC

## 2013-11-01 MED ORDER — CALCIUM CARBONATE ANTACID 500 MG PO CHEW
1.0000 | CHEWABLE_TABLET | Freq: Every day | ORAL | Status: DC
  Administered 2013-11-01: 11:00:00 200 mg via ORAL
  Filled 2013-11-01: qty 1

## 2013-11-01 NOTE — Progress Notes (Signed)
[facility]  2D Echocardiogram completed.  [name], RDCS 11/01/2013 1:03 PM

## 2013-11-01 NOTE — ED Provider Notes (Signed)
CSN: [id number]     Arrival date & time 11/01/13  0407 History   First MD Initiated Contact with Patient 11/01/13 0410     Chief Complaint  Patient presents with  . Chest Pain   (Consider location/radiation/quality/duration/timing/severity/associated sxs/prior Treatment) HPI Comments: 79 year old female, history of hypertension and hyperlipidemia and acid reflux as well as having frequent PVCs and history of bigeminy. She presents with a complaint of chest pain which she states starts around her umbilicus, radiates up into her chest and feels like a pressure sensation, feels like it goes to her left shoulder as well. She does have some nausea but no shortness of breath and no diaphoresis. The symptoms started at 9:30 last night, they gradually improved and she was able to go to sleep but then it woke her up again a short time ago. She has never had symptoms like this before. It is somewhat similar to her acid reflux but she states the symptoms are severe this evening. She took a nitroglycerin at home with no improvement in her symptoms. She denies any history of obstructive coronary disease, she has never had a heart catheterization but does endorse having a stress test which she states was nonobstructive in the past.  The history is provided by the patient.    Past Medical History  Diagnosis Date  . Dyslipidemia   . Hypertension   . Palpitations     PVCs/bigeminy on event in May 2009 revealing this was relatively asymptomatic  . Chest pain   . Cardiovascular disease     moderate by USN 01/2010  . Degenerative joint disease   . Hx of varicella   . History of skin cancer     tafeen/ non melanoma.   . Thrombocytopenia   . Anemia   . Monoclonal gammopathies   . Right lower quadrant abdominal pain 07/19/2012    Recurrent  With nausea    This is new since she had ct for llq pain in [facility]    R/o appendiceal problem  Hernia  Get ct scan  And plan  Fu     . Diastolic CHF, acute on chronic  01/31/2012   Past Surgical History  Procedure Laterality Date  . Cholecystectomy  1999  . Cesarean section      times 2  . Shoulder surgery  1996    Right   . Cataract extraction      Bilateral  implantt   Family History  Problem Relation Age of Onset  . Coronary artery disease Father   . Heart disease Father   . Lung cancer    . Alzheimer's disease Mother   . Cancer Brother 54    lung cancer   History  Substance Use Topics  . Smoking status: Never Smoker   . Smokeless tobacco: Never Used  . Alcohol Use: No   OB History   Grav Para Term Preterm Abortions TAB SAB Ect Mult Living                 Review of Systems  All other systems reviewed and are negative.    Allergies  Review of patient's allergies indicates no known allergies.  Home Medications   Current Outpatient Rx  Name  Route  Sig  Dispense  Refill  . acetaminophen (TYLENOL) 325 MG tablet   Oral   Take 650 mg by mouth every 4 (four) hours as needed for pain.          Marland Kitchen aspirin 81 MG chewable  tablet   Oral   Chew 81 mg by mouth daily.         . calcium carbonate 200 MG capsule   Oral   Take 250 mg by mouth daily.         . carvedilol (COREG) 3.125 MG tablet   Oral   Take 3.125 mg by mouth 2 (two) times daily.         Marland Kitchen levothyroxine (SYNTHROID, LEVOTHROID) 75 MCG tablet   Oral   Take 1 tablet (75 mcg total) by mouth daily.   90 tablet   3   . loratadine (CLARITIN) 10 MG tablet   Oral   Take 10 mg by mouth daily as needed for allergies.         . nitroGLYCERIN (NITROSTAT) 0.4 MG SL tablet   Sublingual   Place 0.4 mg under the tongue every 5 (five) minutes as needed. For chest pain         . quinapril (ACCUPRIL) 40 MG tablet   Oral   Take 1 tablet (40 mg total) by mouth daily.   90 tablet   3   . simvastatin (ZOCOR) 20 MG tablet   Oral   Take 1 tablet (20 mg total) by mouth at bedtime.   90 tablet   3    BP 151/74  Pulse 73  Temp(Src) 97.9 F (36.6 C) (Oral)   Resp 18  SpO2 98% Physical Exam  Nursing note and vitals reviewed. Constitutional: She appears well-developed and well-nourished. No distress.  HENT:  Head: Normocephalic and atraumatic.  Mouth/Throat: Oropharynx is clear and moist. No oropharyngeal exudate.  Eyes: Conjunctivae and EOM are normal. Pupils are equal, round, and reactive to light. Right eye exhibits no discharge. Left eye exhibits no discharge. No scleral icterus.  Neck: Normal range of motion. Neck supple. No JVD present. No thyromegaly present.  Cardiovascular: Normal rate, regular rhythm, normal heart sounds and intact distal pulses.  Exam reveals no gallop and no friction rub.   No murmur heard. Occasional ectopy, PVC seen on the monitor that correlate with pulse  Pulmonary/Chest: Effort normal and breath sounds normal. No respiratory distress. She has no wheezes. She has no rales.  Abdominal: Soft. Bowel sounds are normal. She exhibits no distension and no mass. There is tenderness ( Minimal tenderness in the right upper quadrant, epigastrium and left upper quadrant).  Musculoskeletal: Normal range of motion. She exhibits no edema and no tenderness.  Lymphadenopathy:    She has no cervical adenopathy.  Neurological: She is alert. Coordination normal.  Skin: Skin is warm and dry. No rash noted. No erythema.  Psychiatric: She has a normal mood and affect. Her behavior is normal.    ED Course  Procedures (including critical care time) Labs Review Labs Reviewed  CBC - Abnormal; Notable for the following:    Platelets 148 (*)    All other components within normal limits  POCT I-STAT, CHEM 8 - Abnormal; Notable for the following:    BUN 26 (*)    Glucose, Bld 124 (*)    Calcium, Ion 1.31 (*)    Hemoglobin 15.6 (*)    All other components within normal limits  PROTIME-INR  APTT  COMPREHENSIVE METABOLIC PANEL  LIPASE, BLOOD  POCT I-STAT TROPONIN I   Imaging Review No results found.  EKG Interpretation   None        MDM   1. Chest pain   2. GERD (gastroesophageal reflux disease)    Overall  the patient appears well, is concerning that she has some hypertension and ongoing chest pain with radiation to the left shoulder. Despite her history of acid reflux these symptoms do appear different and more intense than prior symptoms, EKG shows occasional ectopy but no acute ischemia, will need to be ruled out with coronary markers, anticipate admission for this.  ED ECG REPORT  I personally interpreted this EKG   Date: 11/01/2013   Rate: 89  Rhythm: normal sinus rhythm and premature ventricular contractions (PVC)  QRS Axis: normal  Intervals: normal  ST/T Wave abnormalities: nonspecific T wave changes  Conduction Disutrbances:none  Narrative Interpretation:   Old EKG Reviewed: unchanged  The patient has improved slightly after GI cocktail, symptoms are very mild, troponin 0.05, below the cutoff for a myocardial infarction, discussed with hospitalist and with the patient, we'll provide observational admission with cardiac rule out, serial cardiac enzymes.  Dr. Toniann Fail has authorized holding orders.   Vida Roller, MD 11/01/13 [phone]

## 2013-11-01 NOTE — Progress Notes (Signed)
Patient admitted earlier this morning with chest pain.  Patient feels well at this time. Denies chest pain or other symptoms. Pain started in upper abdomen and radiated to chest.  Vitas are stable. Tele shows PVC's which is chronic for her.  Lungs are CTA Heart: S1S2 normal regular with occ premature beats, No s3s4.  Abdomen: Minimal tenderness in epigastrium without rebound rigidity or guarding. No masses or organomegaly.  Chest pain is most likely due to GERD/Gastritis. Will get serial troponins. ECHo ordered as well.  If troponins are normal, she can be discharged with close follow up with PCP. She will be prescribed PPI as she does have significant GERD and takes Rolaid on regular basis. Patient is agreeable with plan. Will take her off O2 and check RA sats. Discussed with Rn as well.  [name] 8:18 AM

## 2013-11-01 NOTE — Progress Notes (Signed)
Patient refused flu vaccine and pneumonia vaccine. Wants to receive vaccine at doctor's office this week. Erskin Burnet RN

## 2013-11-01 NOTE — H&P (Signed)
[facility] History and Physical  [name] ZOX:[id number] DOB: 1925/03/27 DOA: 11/01/2013  Referring physician: ER physician. PCP: Lorretta Harp, MD  Specialists: Dr. [name].  Chief Complaint: Chest pain.  HPI: [name] is a 79 y.o. female history of hypertension, hyperlipidemia, hypothyroidism, diastolic CHF, MGUS presented to the ER because of chest pain. Patient last evening was at her daughter's house and had some barbecue steak. Following which patient felt slight discomfort in the chest and epigastric area. This resolved by itself and patient went to sleep at her house. At around 2:30 AM chest discomfort woke her up again. At this point patient came to the ER. Patient chest pain improved after she was given GI cocktail. The chest pain is pressure-like sometimes radiating to back. No associated shortness of breath diaphoresis palpitations dizziness. In the ER EKG showed normal sinus rhythm with frequent PVCs and chest x-ray did not show anything acute. Patient has been admitted for further management. Patient denies any nausea vomiting diarrhea fever chills or productive cough.   Review of Systems: As presented in the history of presenting illness, rest negative.  Past Medical History  Diagnosis Date  . Dyslipidemia   . Hypertension   . Palpitations     PVCs/bigeminy on event in May 2009 revealing this was relatively asymptomatic  . Chest pain   . Cardiovascular disease     moderate by USN 01/2010  . Degenerative joint disease   . Hx of varicella   . History of skin cancer     tafeen/ non melanoma.   . Thrombocytopenia   . Anemia   . Monoclonal gammopathies   . Right lower quadrant abdominal pain 07/19/2012    Recurrent  With nausea    This is new since she had ct for llq pain in [facility]    R/o appendiceal problem  Hernia  Get ct scan  And plan  Fu     . Diastolic CHF, acute on chronic 01/31/2012   Past Surgical History  Procedure Laterality Date  .  Cholecystectomy  1999  . Cesarean section      times 2  . Shoulder surgery  1996    Right   . Cataract extraction      Bilateral  implantt   Social History:  reports that she has never smoked. She has never used smokeless tobacco. She reports that she does not drink alcohol or use illicit drugs. Where does patient live home. Can patient participate in ADLs? Yes.  No Known Allergies  Family History:  Family History  Problem Relation Age of Onset  . Coronary artery disease Father   . Heart disease Father   . Lung cancer    . Alzheimer's disease Mother   . Cancer Brother 96    lung cancer      Prior to Admission medications   Medication Sig Start Date End Date Taking? Authorizing Provider  acetaminophen (TYLENOL) 325 MG tablet Take 650 mg by mouth every 4 (four) hours as needed for pain.    Yes Historical Provider, MD  aspirin 81 MG chewable tablet Chew 81 mg by mouth daily.   Yes Historical Provider, MD  calcium carbonate 200 MG capsule Take 250 mg by mouth daily.   Yes Historical Provider, MD  carvedilol (COREG) 3.125 MG tablet Take 3.125 mg by mouth 2 (two) times daily. 12/24/12  Yes Historical Provider, MD  levothyroxine (SYNTHROID, LEVOTHROID) 75 MCG tablet Take 1 tablet (75 mcg total) by mouth daily. 01/03/13  Yes Pricilla Riffle, MD  loratadine (CLARITIN) 10 MG tablet Take 10 mg by mouth daily as needed for allergies.   Yes Historical Provider, MD  nitroGLYCERIN (NITROSTAT) 0.4 MG SL tablet Place 0.4 mg under the tongue every 5 (five) minutes as needed. For chest pain   Yes Historical Provider, MD  quinapril (ACCUPRIL) 40 MG tablet Take 1 tablet (40 mg total) by mouth daily. 01/01/13  Yes Pricilla Riffle, MD  simvastatin (ZOCOR) 20 MG tablet Take 1 tablet (20 mg total) by mouth at bedtime. 01/03/13  Yes Pricilla Riffle, MD    Physical Exam: Filed Vitals:   11/01/13 0415 11/01/13 0417 11/01/13 0622 11/01/13 0635  BP: 151/74  109/58   Pulse: 73     Temp: 97.9 F (36.6 C)  97.9 F  (36.6 C)   TempSrc: Oral  Oral   Resp: 18  20   Height:    4\' 10"  (1.473 m)  Weight:    70.2 kg (154 lb 12.2 oz)  SpO2: 95% 98% 98%      General:  Well-developed and nourished.  Eyes: Anicteric no pallor.  ENT: No discharge from ears eyes nose mouth.  Neck: No mass felt.  Cardiovascular: S1-S2 heard.  Respiratory: No rhonchi or crepitations.  Abdomen: Soft nontender bowel sounds present.  Skin: No rash.  Musculoskeletal: No edema.  Psychiatric: Appears normal.  Neurologic: Alert awake oriented to time place and person. Moves all extremities.  Labs on Admission:  Basic Metabolic Panel:  Recent Labs Lab 11/01/13 0500 11/01/13 0505  NA 137 141  K 4.2 3.7  CL 104 105  CO2 24  --   GLUCOSE 125* 124*  BUN 24* 26*  CREATININE 0.90 1.00  CALCIUM 10.3  --    Liver Function Tests:  Recent Labs Lab 11/01/13 0500  AST 15  ALT 10  ALKPHOS 70  BILITOT 0.8  PROT 6.5  ALBUMIN 3.4*    Recent Labs Lab 11/01/13 0500  LIPASE 41   No results found for this basename: AMMONIA,  in the last 168 hours CBC:  Recent Labs Lab 11/01/13 0500 11/01/13 0505  WBC 5.7  --   HGB 14.2 15.6*  HCT 41.9 46.0  MCV 89.5  --   PLT 148*  --    Cardiac Enzymes: No results found for this basename: CKTOTAL, CKMB, CKMBINDEX, TROPONINI,  in the last 168 hours  BNP (last 3 results) No results found for this basename: PROBNP,  in the last 8760 hours CBG: No results found for this basename: GLUCAP,  in the last 168 hours  Radiological Exams on Admission: Dg Chest Portable 1 View  11/01/2013   CLINICAL DATA:  Chest pain  EXAM: PORTABLE CHEST - 1 VIEW  COMPARISON:  07/11/2013  FINDINGS: Cardiomegaly. Difference from prior likely related to lower lung volumes. No acute upper mediastinal contour change, with widening of the upper mediastinum likely ectatic vasculature. Chronic bronchitic changes. No acute infiltrate, edema, effusion, or pneumothorax.  IMPRESSION: Chronic bronchitic  changes without evidence of acute superimposed disease.   Electronically Signed   By: Tiburcio Pea M.D.   On: 11/01/2013 05:52    EKG: Independently reviewed. Normal sinus rhythm with frequent PVCs.  Assessment/Plan Principal Problem:   Chest pain Active Problems:   HYPERLIPIDEMIA-MIXED   HYPERTENSION, BENIGN   Hypothyroidism   GERD (gastroesophageal reflux disease)   1. Chest pain - at this time patient is chest pain-free. Patient's symptoms improved with GI cocktail so symptoms are also  concerning for gastric causes. Given patient's history of hypertension hyperlipidemia and age at this time we will cycle cardiac markers. Continue with aspirin and when necessary nitroglycerin. Patient has had stress test in 2011 which was unremarkable and 2-D echo done in March 2013 showed EF of 55-60%. I have ordered a repeat 2-D echo. On exam patient has mild epigastric discomfort for which I place patient on PPI and also ordered repeat LFTs and lipase. 2. Hypertension - would continue present home medications. 3. History of frequent PVCs - continue Coreg. 4. History of hyperlipidemia - continue statins. 5. Hypothyroidism - continue Synthroid. 6. Mild thrombocytopenia - follow CBC. 7. History of MGUS  I have reviewed patient's old charts and compared to labs and independently reviewed patient's EKG and chest x-ray.    Code Status: Full code.  Family Communication: None.  Disposition Plan: Admit for observation.    Eber Ferrufino N. [facility] Pager [phone].  If 7PM-7AM, please contact night-coverage [email or web address] Password [facility] 11/01/2013, 6:54 AM

## 2013-11-01 NOTE — ED Notes (Signed)
Patient states that she was sitting when she felt like she had heartburn initially that was unrelieved with an antiacid. Then pain became more pressure in the chest and left shoulder. She denies nausea, vomiting, or diaphoresis.

## 2013-11-01 NOTE — Progress Notes (Signed)
Report called to Amanda, RN.

## 2013-11-01 NOTE — Discharge Summary (Signed)
[facility]  Physician Discharge Summary   Patient ID: KESLYN Dickson MRN: [id number] DOB/AGE: 1925/02/21 79 y.o.  Admit date: 11/01/2013 Discharge date: 11/01/2013  PCP: Lorretta Harp, MD  DISCHARGE DIAGNOSES:  Active Problems:   HYPERLIPIDEMIA-MIXED   HYPERTENSION, BENIGN   Hypothyroidism   GERD (gastroesophageal reflux disease)   RECOMMENDATIONS FOR OUTPATIENT FOLLOW UP: 1. It appears her chest pain was secondary to GERD. She has been prescribed Omeprazole for same.   DISCHARGE CONDITION: fair  Diet recommendation: Low Sodium  Filed Weights   11/01/13 0635  Weight: 70.2 kg (154 lb 12.2 oz)    INITIAL HISTORY: [name] is a 79 y.o. female history of hypertension, hyperlipidemia, hypothyroidism, diastolic CHF, MGUS presented to the ER because of chest pain. Patient last evening was at her daughter's house and had some barbecue steak. Following which patient felt slight discomfort in the chest and epigastric area. This resolved by itself and patient went to sleep at her house. At around 2:30 AM chest discomfort woke her up again. At this point patient came to the ER. Patient chest pain improved after she was given GI cocktail. In the ER EKG showed normal sinus rhythm with frequent PVCs and chest x-ray did not show anything acute. Patient was admitted for further management.   Consultations:  None  Procedures: 2D ECHO 12/19 Study Conclusions - Left ventricle: The cavity size was normal. Wall thickness  was increased in a pattern of mild LVH. Systolic function was normal. The estimated ejection fraction was in the range of 55% to 60%. - Mitral valve: Calcified annulus. Mildly thickened leaflets . - Left atrium: The atrium was moderately to severely dilated. - Atrial septum: No defect or patent foramen ovale was identified.  HOSPITAL COURSE:   Patient was admitted with chest pain which was most likely secondary to GERD. She had PVC's on EKg  which were chronic based on cardiology notes. She was observed in the hospital due to her risk factors. Troponins were normal. ECHO did not show any wall motion abnormalities and her pain had resolved with Gi cocktail. She was prescribed PPI to take at home. She admitted to taking Rolaids on a very regular basis. She will follow with her PCP for further management of same.  ECHO does show mild LVH for which she is already on a ACEI. Other non specific findings can be pursued further by her PCP or cardiologist.  She may resume rest of her home medications as before.  She is stable for discharge. All of the above discussed with patient and she is agreeable with the plan.   PERTINENT LABS:  The results of significant diagnostics from this hospitalization (including imaging, microbiology, ancillary and laboratory) are listed below for reference.    Labs: Basic Metabolic Panel:  Recent Labs Lab 11/01/13 0500 11/01/13 0505  NA 137 141  K 4.2 3.7  CL 104 105  CO2 24  --   GLUCOSE 125* 124*  BUN 24* 26*  CREATININE 0.90 1.00  CALCIUM 10.3  --    Liver Function Tests:  Recent Labs Lab 11/01/13 0500  AST 15  ALT 10  ALKPHOS 70  BILITOT 0.8  PROT 6.5  ALBUMIN 3.4*    Recent Labs Lab 11/01/13 0500  LIPASE 41   CBC:  Recent Labs Lab 11/01/13 0500 11/01/13 0505 11/01/13 0730  WBC 5.7  --  DUPLICATE REQUEST SEE [id number],AMANDA,RN  NEUTROABS 3.8  --  PENDING  HGB 14.2 15.6* DUPLICATE REQUEST SEE [employer]  HCT 41.9 46.0 DUPLICATE REQUEST SEE [id number],AMANDA,RN  MCV 89.5  --  DUPLICATE REQUEST SEE [id number],AMANDA,RN  PLT 148*  --  DUPLICATE REQUEST SEE [id number],AMANDA,RN   Cardiac Enzymes:  Recent Labs Lab 11/01/13 1100 11/01/13 1701  TROPONINI <0.30 <0.30   IMAGING STUDIES Dg Chest Portable 1 View  11/01/2013   CLINICAL DATA:  Chest pain  EXAM: PORTABLE CHEST - 1 VIEW  COMPARISON:  07/11/2013  FINDINGS: Cardiomegaly. Difference from prior likely related to lower  lung volumes. No acute upper mediastinal contour change, with widening of the upper mediastinum likely ectatic vasculature. Chronic bronchitic changes. No acute infiltrate, edema, effusion, or pneumothorax.  IMPRESSION: Chronic bronchitic changes without evidence of acute superimposed disease.   Electronically Signed   By: Tiburcio Pea M.D.   On: 11/01/2013 05:52    DISCHARGE EXAMINATION: See progress note from earlier today.  DISPOSITION: Home with family  Discharge Orders   Future Appointments Provider Department Dept Phone   12/03/2013 3:15 PM Alvan Dame, [facility] [phone]   07/25/2014 3:00 PM [facility] [phone]   07/25/2014 3:30 PM [facility] [phone]   08/01/2014 2:30 PM Artis Delay, MD [facility] [phone]   Future Orders Complete By Expires   Diet - low sodium heart healthy  As directed    Discharge instructions  As directed    Comments:     Please follow up with your PCP next week. You have been prescribed a medication for acid reflux which should help with your symptoms. If you don't feel better please discuss it further with your doctor.   Increase activity slowly  As directed       ALLERGIES: No Known Allergies  Current Discharge Medication List    START taking these medications   Details  omeprazole (PRILOSEC) 20 MG capsule Take 1 capsule (20 mg total) by mouth daily. Qty: 30 capsule, Refills: 0      CONTINUE these medications which have NOT CHANGED   Details  acetaminophen (TYLENOL) 325 MG tablet Take 650 mg by mouth every 4 (four) hours as needed for pain.    Associated Diagnoses: Thrombocytopenia; Hypothyroidism; Monoclonal gammopathies    aspirin 81 MG chewable tablet Chew 81 mg by mouth daily.    calcium carbonate 200 MG capsule Take 250 mg by mouth daily.   Associated Diagnoses:  Thrombocytopenia; Hypothyroidism; Monoclonal gammopathies    carvedilol (COREG) 3.125 MG tablet Take 3.125 mg by mouth 2 (two) times daily.    levothyroxine (SYNTHROID, LEVOTHROID) 75 MCG tablet Take 1 tablet (75 mcg total) by mouth daily. Qty: 90 tablet, Refills: 3    loratadine (CLARITIN) 10 MG tablet Take 10 mg by mouth daily as needed for allergies.    nitroGLYCERIN (NITROSTAT) 0.4 MG SL tablet Place 0.4 mg under the tongue every 5 (five) minutes as needed. For chest pain    quinapril (ACCUPRIL) 40 MG tablet Take 1 tablet (40 mg total) by mouth daily. Qty: 90 tablet, Refills: 3    simvastatin (ZOCOR) 20 MG tablet Take 1 tablet (20 mg total) by mouth at bedtime. Qty: 90 tablet, Refills: 3       Follow-up Information   Follow up with Lorretta Harp, MD. Schedule an appointment as soon as possible for a visit in 1 week.   Specialty:  Internal Medicine   Contact information:   [address] [place] [address] [phone]  TOTAL DISCHARGE TIME: 35 mins  [facility]  [facility] Pager [phone]  11/01/2013, 5:45 PM

## 2013-11-15 ENCOUNTER — Telehealth: Payer: Self-pay | Admitting: Internal Medicine

## 2013-11-15 NOTE — Telephone Encounter (Signed)
Noted  

## 2013-11-15 NOTE — Telephone Encounter (Signed)
Patient Information:  Caller Name: [name]  Phone: [phone]  Patient: [name], [name]  Gender: Female  DOB: 11-Jan-1925  Age: 80 Years  PCP: Shanon Ace (Family Practice)  Office Follow Up:  Does the office need to follow up with this patient?: No  Instructions For The Office: N/A  RN Note:  Onset of pus to right eye 11/14/13 AM.  Afebrile.  Eyelid is tender and mildly swollen.  Patient has appt with Dr. Regis Bill 11/22/12 post hospital follow up.  Per eye discharge protocol, emergent symptoms denied; care advice given, with callback parameters.  Rx for polytrim eye drops, 2 gtts OU QID x 5 days, #1 bottle, NR called in per standing orders to [facility] [phone].  krs/can  Symptoms  Reason For Call & Symptoms: possible pinkeye  Reviewed Health History In EMR: Yes  Reviewed Medications In EMR: Yes  Reviewed Allergies In EMR: Yes  Reviewed Surgeries / Procedures: Yes  Date of Onset of Symptoms: 11/15/2013  Guideline(s) Used:  Eye - Pus or Discharge  Disposition Per Guideline:   Home Care  Reason For Disposition Reached:   Eye with yellow/green discharge or eyelashes stick together, and PCP standing order to call in antibiotic eye drops  Advice Given:  N/A  Patient Will Follow Care Advice:  YES

## 2013-11-22 ENCOUNTER — Encounter: Payer: Self-pay | Admitting: Internal Medicine

## 2013-11-22 ENCOUNTER — Ambulatory Visit (INDEPENDENT_AMBULATORY_CARE_PROVIDER_SITE_OTHER): Payer: Medicare Other | Admitting: Internal Medicine

## 2013-11-22 VITALS — BP 160/82 | HR 65 | Temp 97.8°F | Wt 156.0 lb

## 2013-11-22 DIAGNOSIS — E039 Hypothyroidism, unspecified: Secondary | ICD-10-CM

## 2013-11-22 DIAGNOSIS — Z23 Encounter for immunization: Secondary | ICD-10-CM

## 2013-11-22 DIAGNOSIS — K219 Gastro-esophageal reflux disease without esophagitis: Secondary | ICD-10-CM

## 2013-11-22 DIAGNOSIS — I10 Essential (primary) hypertension: Secondary | ICD-10-CM

## 2013-11-22 DIAGNOSIS — M542 Cervicalgia: Secondary | ICD-10-CM

## 2013-11-22 DIAGNOSIS — R0789 Other chest pain: Secondary | ICD-10-CM | POA: Insufficient documentation

## 2013-11-22 LAB — TSH: TSH: 2.08 u[IU]/mL (ref 0.35–5.50)

## 2013-11-22 NOTE — Progress Notes (Signed)
Chief Complaint  Patient presents with  . Hospital Follow up    Seen for chest pain.  Complains today of neck pain.    HPI: Patient comes in today as follow up from hospitalization for cheest pain in 12 19 dc  Felt to be from poss gerd   Neg enzymes  Had chest che  Used lots of rolaids  .  Didn't get  The omeprezole  Yet. Concern about cost and is suggested generic No active heart disease recently but does have diastolic heart failure valvular disease.  7 years ago.  Had stress test negative echo in hospital showed mild LVH  Concern about popping in her neck when she turns to the left side no associated pain chest pain shortness of breath with..  Think she's overdue for thyroid test Dr. Harrington Challenger is been giving her refills.  Never got a flu shot when she was in the hospital was supposed to get it. They forgot.  Follows up with hematology no new symptoms no unusual bleeding. ROS: See pertinent positives and negatives per HPI. No current cough recent syncope falling. Did have some dizziness in the past without associated symptoms.  Past Medical History  Diagnosis Date  . Dyslipidemia   . Hypertension   . Palpitations     PVCs/bigeminy on event in May 2009 revealing this was relatively asymptomatic  . Chest pain   . Cardiovascular disease     moderate by USN 01/2010  . Degenerative joint disease   . Hx of varicella   . History of skin cancer     tafeen/ non melanoma.   . Thrombocytopenia   . Anemia   . Monoclonal gammopathies   . Right lower quadrant abdominal pain 07/19/2012    Recurrent  With nausea    This is new since she had ct for llq pain in [facility]    R/o appendiceal problem  Hernia  Get ct scan  And plan  Fu     . Diastolic CHF, acute on chronic 01/31/2012    Family History  Problem Relation Age of Onset  . Coronary artery disease Father   . Heart disease Father   . Lung cancer    . Alzheimer's disease Mother   . Cancer Brother 17    lung cancer    History   Social  History  . Marital Status: Widowed    Spouse Name: N/A    Number of Children: 3  . Years of Education: N/A   Occupational History  .      [employer], book keeping   Social History Main Topics  . Smoking status: Never Smoker   . Smokeless tobacco: Never Used  . Alcohol Use: No  . Drug Use: No  . Sexual Activity: Not Currently   Other Topics Concern  . 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 Eats with her.    Outpatient Encounter Prescriptions as of 11/22/2013  Medication Sig  . acetaminophen (TYLENOL) 325 MG tablet Take 650 mg by mouth every 4 (four) hours as needed for pain.   Marland Kitchen aspirin 81 MG chewable tablet Chew 81 mg by mouth daily.  . calcium  carbonate 200 MG capsule Take 250 mg by mouth daily.  . carvedilol (COREG) 3.125 MG tablet Take 3.125 mg by mouth 2 (two) times daily.  Marland Kitchen levothyroxine (SYNTHROID, LEVOTHROID) 75 MCG tablet Take 1 tablet (75 mcg total) by mouth daily.  Marland Kitchen loratadine (CLARITIN) 10 MG tablet Take 10 mg by mouth daily as needed for allergies.  . nitroGLYCERIN (NITROSTAT) 0.4 MG SL tablet Place 0.4 mg under the tongue every 5 (five) minutes as needed. For chest pain  . omeprazole (PRILOSEC) 20 MG capsule Take 1 capsule (20 mg total) by mouth daily.  . quinapril (ACCUPRIL) 40 MG tablet Take 1 tablet (40 mg total) by mouth daily.  . simvastatin (ZOCOR) 20 MG tablet Take 1 tablet (20 mg total) by mouth at bedtime.    EXAM:  BP 160/82  Pulse 65  Temp(Src) 97.8 F (36.6 C) (Oral)  Wt 156 lb (70.761 kg)  SpO2 97%  Body mass index is 32.61 kg/(m^2).  GENERAL: vitals reviewed and listed above, alert, oriented, appears well hydrated and in no acute distress alert ambulatory able to get on table by herself significant  kyphosis  HEENT: atraumatic, conjunctiva  clear, no obvious abnormalities on inspection of external nose and ears OP : no lesion edema or exudate   NECK: no obvious masses on inspection palpation neck is some decreased range of motion no obvious masses Abdomen soft bowel sounds normal no obvious masses hepatosplenomegaly. LUNGS: clear to auscultation bilaterally, no wheezes, rales or rhonchi,   CV: HRRR, no clubbing cyanosis or  peripheral edema some varicose veins slight edema nl cap refill short 2/6 systolic ejection murmur heard occasionally but not in her neck PSYCH: pleasant and cooperative, no obvious depression or anxiety Lab Results  Component Value Date   WBC DUPLICATE REQUEST SEE [id number],OZ 30/86/5784   HGB DUPLICATE REQUEST SEE [id number],GM 11/16/7251   HCT DUPLICATE REQUEST SEE [id number],DG 38/75/6433   PLT DUPLICATE REQUEST SEE [id number],TK 11/01/2013   GLUCOSE 124* 11/01/2013   CHOL 172 10/03/2011   TRIG 63.0 10/03/2011   HDL 95.90 10/03/2011   LDLCALC 64 10/03/2011   ALT 10 11/01/2013   AST 15 11/01/2013   NA 141 11/01/2013   K 3.7 11/01/2013   CL 105 11/01/2013   CREATININE 1.00 11/01/2013   BUN 26* 11/01/2013   CO2 24 11/01/2013   TSH 2.08 11/22/2013   INR 0.97 11/01/2013    ASSESSMENT AND PLAN:  Discussed the following assessment and plan:  Chest discomfort  GERD (gastroesophageal reflux disease) - Probable culprit of her symptoms however she has many risk factors. Advised to go back on the PPI in the short run and followup Rolaids would not be the answer  Hypothyroidism - Due for TSH we'll check - Plan: TSH  Hypertension - elevaetd bp today low at hospital we'll follow due for followup with cardiology also.  Need for prophylactic vaccination and inoculation against influenza - Plan: Flu Vaccine QUAD 36+ mos PF IM (Fluarix)  Neck pain - Really more of a popping and discomfort probably from degenerative disease. Reassured does not sound  vascular Wants to hold off on the pneumonia vaccine Prevnar -Patient advised to return or notify health care team  if symptoms worsen or persist or new concerns arise.  Patient Instructions    Take  The acid reflux  medication   omeprezole  Every day preferable 30 - 60 minutes pre meal    It is generic .  ROV in 1-2 months to see how you are doing  with this.  You are due for the thyroid test.  Will let you know results.  Keep cardiology appt  For fu .   Dr Harrington Challenger can decide if any further testing necessary for heart.   Make appt with her  Or cardiology department.  YOur blood pressure is up today  And may need to be treated .  Check readings   Flu vaccine today  prevnar 13 ( baby pneumonia vaccine) now advised . Can get any.  time   The neck popping seems ot be from arthritis in neck and not from vascular causes.    Diet for Gastroesophageal Reflux Disease, Adult Reflux (acid reflux) is when acid from your stomach flows up into the esophagus. When acid comes in contact with the esophagus, the acid causes irritation and soreness (inflammation) in the esophagus. When reflux happens often or so severely that it causes damage to the esophagus, it is called gastroesophageal reflux disease (GERD). Nutrition therapy can help ease the discomfort of GERD. FOODS OR DRINKS TO AVOID OR LIMIT  Smoking or chewing tobacco. Nicotine is one of the most potent stimulants to acid production in the gastrointestinal tract.  Caffeinated and decaffeinated coffee and black tea.  Regular or low-calorie carbonated beverages or energy drinks (caffeine-free carbonated beverages are allowed).   Strong spices, such as black pepper, white pepper, red pepper, cayenne, curry powder, and chili powder.  Peppermint or spearmint.  Chocolate.  High-fat foods, including meats and fried foods. Extra added fats including oils, butter, salad dressings, and nuts. Limit these to less than 8 tsp per day.  Fruits  and vegetables if they are not tolerated, such as citrus fruits or tomatoes.  Alcohol.  Any food that seems to aggravate your condition. If you have questions regarding your diet, call your caregiver or a registered dietitian. OTHER THINGS THAT MAY HELP GERD INCLUDE:   Eating your meals slowly, in a relaxed setting.  Eating 5 to 6 small meals per day instead of 3 large meals.  Eliminating food for a period of time if it causes distress.  Not lying down until 3 hours after eating a meal.  Keeping the head of your bed raised 6 to 9 inches (15 to 23 cm) by using a foam wedge or blocks under the legs of the bed. Lying flat may make symptoms worse.  Being physically active. Weight loss may be helpful in reducing reflux in overweight or obese adults.  Wear loose fitting clothing EXAMPLE MEAL PLAN This meal plan is approximately 2,000 calories based on [email or web address] meal planning guidelines. Breakfast   cup cooked oatmeal.  1 cup strawberries.  1 cup low-fat milk.  1 oz almonds. Snack  1 cup cucumber slices.  6 oz yogurt (made from low-fat or fat-free milk). Lunch  2 slice whole-wheat bread.  2 oz sliced [place].  2 tsp mayonnaise.  1 cup blueberries.  1 cup snap peas. Snack  6 whole-wheat crackers.  1 oz string cheese. Dinner   cup brown rice.  1 cup mixed veggies.  1 tsp olive oil.  3 oz grilled fish. Document Released: 10/31/2005 Document Revised: 01/23/2012 Document Reviewed: 09/16/2011 [facility] Patient Information 2014 [place], [place].      Standley Brooking. Vickie Ponds M.D.  Pre visit review using our clinic review tool, if applicable. No additional management support is needed unless otherwise documented below in the visit note.

## 2013-11-22 NOTE — Patient Instructions (Signed)
   Take  The acid reflux  medication   omeprezole  Every day preferable 30 - 60 minutes pre meal    It is generic .  ROV in 1-2 months to see how you are doing with this.  You are due for the thyroid test.  Will let you know results.  Keep cardiology appt  For fu .   Dr Harrington Challenger can decide if any further testing necessary for heart.   Make appt with her  Or cardiology department.  YOur blood pressure is up today  And may need to be treated .  Check readings   Flu vaccine today  prevnar 13 ( baby pneumonia vaccine) now advised . Can get any.  time   The neck popping seems ot be from arthritis in neck and not from vascular causes.    Diet for Gastroesophageal Reflux Disease, Adult Reflux (acid reflux) is when acid from your stomach flows up into the esophagus. When acid comes in contact with the esophagus, the acid causes irritation and soreness (inflammation) in the esophagus. When reflux happens often or so severely that it causes damage to the esophagus, it is called gastroesophageal reflux disease (GERD). Nutrition therapy can help ease the discomfort of GERD. FOODS OR DRINKS TO AVOID OR LIMIT  Smoking or chewing tobacco. Nicotine is one of the most potent stimulants to acid production in the gastrointestinal tract.  Caffeinated and decaffeinated coffee and black tea.  Regular or low-calorie carbonated beverages or energy drinks (caffeine-free carbonated beverages are allowed).   Strong spices, such as black pepper, white pepper, red pepper, cayenne, curry powder, and chili powder.  Peppermint or spearmint.  Chocolate.  High-fat foods, including meats and fried foods. Extra added fats including oils, butter, salad dressings, and nuts. Limit these to less than 8 tsp per day.  Fruits and vegetables if they are not tolerated, such as citrus fruits or tomatoes.  Alcohol.  Any food that seems to aggravate your condition. If you have questions regarding your diet, call your  caregiver or a registered dietitian. OTHER THINGS THAT MAY HELP GERD INCLUDE:   Eating your meals slowly, in a relaxed setting.  Eating 5 to 6 small meals per day instead of 3 large meals.  Eliminating food for a period of time if it causes distress.  Not lying down until 3 hours after eating a meal.  Keeping the head of your bed raised 6 to 9 inches (15 to 23 cm) by using a foam wedge or blocks under the legs of the bed. Lying flat may make symptoms worse.  Being physically active. Weight loss may be helpful in reducing reflux in overweight or obese adults.  Wear loose fitting clothing EXAMPLE MEAL PLAN This meal plan is approximately 2,000 calories based on [email or web address] meal planning guidelines. Breakfast   cup cooked oatmeal.  1 cup strawberries.  1 cup low-fat milk.  1 oz almonds. Snack  1 cup cucumber slices.  6 oz yogurt (made from low-fat or fat-free milk). Lunch  2 slice whole-wheat bread.  2 oz sliced [place].  2 tsp mayonnaise.  1 cup blueberries.  1 cup snap peas. Snack  6 whole-wheat crackers.  1 oz string cheese. Dinner   cup brown rice.  1 cup mixed veggies.  1 tsp olive oil.  3 oz grilled fish. Document Released: 10/31/2005 Document Revised: 01/23/2012 Document Reviewed: 09/16/2011 [facility] Patient Information 2014 [place], [place].

## 2013-11-25 ENCOUNTER — Encounter: Payer: Self-pay | Admitting: Family Medicine

## 2013-12-02 ENCOUNTER — Encounter: Payer: Self-pay | Admitting: Internal Medicine

## 2013-12-02 ENCOUNTER — Ambulatory Visit (INDEPENDENT_AMBULATORY_CARE_PROVIDER_SITE_OTHER): Payer: Medicare Other | Admitting: Internal Medicine

## 2013-12-02 VITALS — BP 130/82 | HR 65 | Ht <= 58 in | Wt 155.0 lb

## 2013-12-02 DIAGNOSIS — I4949 Other premature depolarization: Secondary | ICD-10-CM

## 2013-12-02 DIAGNOSIS — I493 Ventricular premature depolarization: Secondary | ICD-10-CM

## 2013-12-02 MED ORDER — NITROGLYCERIN 0.4 MG SL SUBL: 0.4000 mg | SUBLINGUAL_TABLET | SUBLINGUAL | Status: DC | PRN

## 2013-12-02 NOTE — Progress Notes (Signed)
HPI Patient is an 80 year old with a history of HTN, HL, aortic sclerosis  Had a normal myoview in 2011.No Known Allergies I saw her in August 2013.   Notes occasional chest tightness.  Associated with chang in position. Breathing OK Had episode of dizziness on 1/9  Has not had any dizziness since Sleeps good.   Appetite OK Notes occasional reflux  Does complain of buring.   Didn't fill omeprazole yet. Current Outpatient Prescriptions  Medication Sig Dispense Refill  . acetaminophen (TYLENOL) 325 MG tablet Take 650 mg by mouth every 4 (four) hours as needed for pain.       Marland Kitchen aspirin 81 MG chewable tablet Chew 81 mg by mouth daily.      . calcium carbonate 200 MG capsule Take 250 mg by mouth daily.      . carvedilol (COREG) 3.125 MG tablet Take 3.125 mg by mouth 2 (two) times daily.      Marland Kitchen levothyroxine (SYNTHROID, LEVOTHROID) 75 MCG tablet Take 1 tablet (75 mcg total) by mouth daily.  90 tablet  3  . loratadine (CLARITIN) 10 MG tablet Take 10 mg by mouth daily as needed for allergies.      . nitroGLYCERIN (NITROSTAT) 0.4 MG SL tablet Place 0.4 mg under the tongue every 5 (five) minutes as needed. For chest pain      . omeprazole (PRILOSEC) 20 MG capsule Take 1 capsule (20 mg total) by mouth daily.  30 capsule  0  . quinapril (ACCUPRIL) 40 MG tablet Take 1 tablet (40 mg total) by mouth daily.  90 tablet  3  . simvastatin (ZOCOR) 20 MG tablet Take 1 tablet (20 mg total) by mouth at bedtime.  90 tablet  3   No current facility-administered medications for this visit.    Past Medical History  Diagnosis Date  . Dyslipidemia   . Hypertension   . Palpitations     PVCs/bigeminy on event in May 2009 revealing this was relatively asymptomatic  . Chest pain   . Cardiovascular disease     moderate by USN 01/2010  . Degenerative joint disease   . Hx of varicella   . History of skin cancer     tafeen/ non melanoma.   . Thrombocytopenia   . Anemia   . Monoclonal gammopathies   . Right lower  quadrant abdominal pain 07/19/2012    Recurrent  With nausea    This is new since she had ct for llq pain in [facility]    R/o appendiceal problem  Hernia  Get ct scan  And plan  Fu     . Diastolic CHF, acute on chronic 01/31/2012    Past Surgical History  Procedure Laterality Date  . Cholecystectomy  1999  . Cesarean section      times 2  . Shoulder surgery  1996    Right   . Cataract extraction      Bilateral  implantt    Family History  Problem Relation Age of Onset  . Coronary artery disease Father   . Heart disease Father   . Lung cancer    . Alzheimer's disease Mother   . Cancer Brother 22    lung cancer    History   Social History  . Marital Status: Widowed    Spouse Name: N/A    Number of Children: 3  . Years of Education: N/A   Occupational History  .      [employer], book keeping  Social History Main Topics  . Smoking status: Never Smoker   . Smokeless tobacco: Never Used  . Alcohol Use: No  . Drug Use: No  . Sexual Activity: Not Currently   Other Topics Concern  . 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 Eats with her.    Review of Systems:  All systems reviewed.  They are negative to the above problem except as previously stated.  Vital Signs: BP 130/82  Pulse 65  Ht 4\' 10"  (1.473 m)  Wt 155 lb (70.308 kg)  BMI 32.40 kg/m2  Physical Exam Patient is in NAD HEENT:  Normocephalic, atraumatic. EOMI, PERRLA.  Neck: JVP is normal.  No bruits.  Lungs: clear to auscultation. No rales no wheezes.  Heart: Regular rate and rhythm. Normal S1, S2. No S3.   No significant murmurs. PMI not displaced.  Abdomen:  Supple, nontender. Normal bowel sounds. No masses. No hepatomegaly.  Extremities:   Good distal pulses throughout.  No lower extremity edema.  Musculoskeletal :moving all extremities.  Neuro:   alert and oriented x3.  CN II-XII grossly intact.  EKG  SR.  76 bpm.  First degree av block  PR 258 msec.  Frequent PVCs (LBBB morphology) Assessment and Plan:  1.  CP  Admitted in [place] to be GI in origin Echo wsa normal Told her to start omeprazole  2.  PVCs  Will setu up for holter monitor  2.  HTN  Good control  Denies dizziness    3.  HL  Excellent in NOvember 2012.

## 2013-12-02 NOTE — Patient Instructions (Signed)
Your physician has recommended that you wear a holter monitor. Holter monitors are medical devices that record the heart's electrical activity. Doctors most often use these monitors to diagnose arrhythmias. Arrhythmias are problems with the speed or rhythm of the heartbeat. The monitor is a small, portable device. You can wear one while you do your normal daily activities. This is usually used to diagnose what is causing palpitations/syncope (passing out).  Dr. Harrington Challenger will follow up with you regarding the results of the holter monitor.  Continue on same medicines as listed.  Your physician wants you to follow-up in: July or August 2015.  You will receive a reminder letter in the mail two months in advance. If you don't receive a letter, please call our office to schedule the follow-up appointment.

## 2013-12-03 ENCOUNTER — Ambulatory Visit: Payer: Medicare Other

## 2013-12-04 ENCOUNTER — Encounter (INDEPENDENT_AMBULATORY_CARE_PROVIDER_SITE_OTHER): Payer: Medicare Other

## 2013-12-04 ENCOUNTER — Encounter: Payer: Self-pay | Admitting: *Deleted

## 2013-12-04 DIAGNOSIS — I493 Ventricular premature depolarization: Secondary | ICD-10-CM

## 2013-12-04 DIAGNOSIS — I4949 Other premature depolarization: Secondary | ICD-10-CM

## 2013-12-04 NOTE — Progress Notes (Signed)
Patient ID: [name], female   DOB: 02-Jun-1925, 80 y.o.   MRN: [id number] E-Cardio 24 hour holter monitor applied to patient.

## 2013-12-13 ENCOUNTER — Other Ambulatory Visit: Payer: Self-pay | Admitting: *Deleted

## 2013-12-13 MED ORDER — LEVOTHYROXINE SODIUM 75 MCG PO TABS: 75.0000 ug | ORAL_TABLET | Freq: Every day | ORAL | Status: DC

## 2013-12-13 MED ORDER — QUINAPRIL HCL 40 MG PO TABS: 40.0000 mg | ORAL_TABLET | Freq: Every day | ORAL | Status: DC

## 2013-12-13 MED ORDER — SIMVASTATIN 20 MG PO TABS
20.0000 mg | ORAL_TABLET | Freq: Every day | ORAL | Status: DC
Start: 2013-12-13 — End: 2014-01-16

## 2013-12-16 ENCOUNTER — Telehealth: Payer: Self-pay | Admitting: Internal Medicine

## 2013-12-16 NOTE — Telephone Encounter (Signed)
Called patinet with holter results.   Freq PVCs  Not always symptomatic  Had sympotms without echopy Recomm  Could hold quinipril for 2 days and double up on coreg (6.25 bid) See if feels better.  Call back with response Will need to f/u with HR and BP if she does this long term

## 2013-12-19 ENCOUNTER — Telehealth: Payer: Self-pay | Admitting: Internal Medicine

## 2013-12-19 NOTE — Telephone Encounter (Signed)
Spoke with pt, these bp and pulse were done at [facility]. She does reports she does feel better. Aware will forward for dr Harrington Challenger review.

## 2013-12-19 NOTE — Telephone Encounter (Signed)
New Message  Pt requests a call back to discuss BP readings// Pt states that she was advised to retrieve these readings// Please call back to assist.   02/5 @ 9:30am 101/64 pulse 76  123/77 pulse 92

## 2013-12-24 ENCOUNTER — Telehealth: Payer: Self-pay | Admitting: Internal Medicine

## 2013-12-24 MED ORDER — CARVEDILOL 6.25 MG PO TABS: 6.2500 mg | ORAL_TABLET | Freq: Two times a day (BID) | ORAL | Status: DC

## 2013-12-24 NOTE — Telephone Encounter (Signed)
Left message for pt, dr Harrington Challenger is pleased with the HR and BP. She will cont on the same medicine. Pt to call with questions or problems.

## 2013-12-24 NOTE — Telephone Encounter (Signed)
Spoke with pt, she reports bp running good, always below 130. Sometimes the blood pressure monitor does not always give the pulse because it is skipping. Pt will try checking the pulse by palpitation for one minute. She will call if concerned.

## 2013-12-24 NOTE — Telephone Encounter (Signed)
New Problem:  Pt states she is returning a call to Hilda Blades with questions about her lab results. Pt would like a call back from [place].

## 2014-01-16 ENCOUNTER — Other Ambulatory Visit: Payer: Self-pay | Admitting: *Deleted

## 2014-01-16 MED ORDER — CARVEDILOL 6.25 MG PO TABS: 6.2500 mg | ORAL_TABLET | Freq: Two times a day (BID) | ORAL | Status: DC

## 2014-01-16 MED ORDER — SIMVASTATIN 20 MG PO TABS: 20.0000 mg | ORAL_TABLET | Freq: Every day | ORAL | Status: DC

## 2014-01-16 MED ORDER — LEVOTHYROXINE SODIUM 75 MCG PO TABS: 75.0000 ug | ORAL_TABLET | Freq: Every day | ORAL | Status: DC

## 2014-01-27 ENCOUNTER — Telehealth: Payer: Self-pay | Admitting: Internal Medicine

## 2014-01-27 NOTE — Telephone Encounter (Signed)
Spoke with pt, for about one month now she can hear the irregular beats on her bp monitor when checking her bp. Her bp is running 130-120/70-80, with pulse rates 70 to 80. We stopped her quinipril related to monitor results and increased her carvedilol to 6.25 mg twice daily. She is also having some SOB but is also having some back issues that may contribute. Will forward for dr Harrington Challenger review.

## 2014-01-27 NOTE — Telephone Encounter (Signed)
New message    For about a month now getting more irregular heart beats .

## 2014-01-29 ENCOUNTER — Emergency Department (HOSPITAL_COMMUNITY): Payer: Medicare Other

## 2014-01-29 ENCOUNTER — Encounter (HOSPITAL_COMMUNITY)
Admission: EM | Disposition: A | Discharge status: Home / Self Care | Payer: Self-pay | Source: Home / Self Care | Attending: Internal Medicine

## 2014-01-29 ENCOUNTER — Inpatient Hospital Stay (HOSPITAL_COMMUNITY)
Admission: EM | Admit: 2014-01-29 | Discharge: 2014-01-30 | DRG: 280 | Disposition: A | Discharge status: Home / Self Care | Payer: Medicare Other | Attending: Internal Medicine | Admitting: Internal Medicine

## 2014-01-29 ENCOUNTER — Encounter (HOSPITAL_COMMUNITY): Payer: Self-pay | Admitting: Emergency Medicine

## 2014-01-29 DIAGNOSIS — I493 Ventricular premature depolarization: Secondary | ICD-10-CM | POA: Diagnosis present

## 2014-01-29 DIAGNOSIS — E785 Hyperlipidemia, unspecified: Secondary | ICD-10-CM | POA: Diagnosis present

## 2014-01-29 DIAGNOSIS — Z8249 Family history of ischemic heart disease and other diseases of the circulatory system: Secondary | ICD-10-CM

## 2014-01-29 DIAGNOSIS — E669 Obesity, unspecified: Secondary | ICD-10-CM | POA: Diagnosis present

## 2014-01-29 DIAGNOSIS — I214 Non-ST elevation (NSTEMI) myocardial infarction: Principal | ICD-10-CM

## 2014-01-29 DIAGNOSIS — I509 Heart failure, unspecified: Secondary | ICD-10-CM

## 2014-01-29 DIAGNOSIS — Z6836 Body mass index (BMI) 36.0-36.9, adult: Secondary | ICD-10-CM

## 2014-01-29 DIAGNOSIS — I4891 Unspecified atrial fibrillation: Secondary | ICD-10-CM

## 2014-01-29 DIAGNOSIS — I2510 Atherosclerotic heart disease of native coronary artery without angina pectoris: Secondary | ICD-10-CM

## 2014-01-29 DIAGNOSIS — I5033 Acute on chronic diastolic (congestive) heart failure: Secondary | ICD-10-CM

## 2014-01-29 DIAGNOSIS — M1990 Unspecified osteoarthritis, unspecified site: Secondary | ICD-10-CM | POA: Diagnosis present

## 2014-01-29 DIAGNOSIS — I4949 Other premature depolarization: Secondary | ICD-10-CM

## 2014-01-29 DIAGNOSIS — I10 Essential (primary) hypertension: Secondary | ICD-10-CM | POA: Diagnosis present

## 2014-01-29 DIAGNOSIS — Z820 Family history of epilepsy and other diseases of the nervous system: Secondary | ICD-10-CM

## 2014-01-29 DIAGNOSIS — E039 Hypothyroidism, unspecified: Secondary | ICD-10-CM | POA: Diagnosis present

## 2014-01-29 HISTORY — DX: Unspecified atrial fibrillation: I48.91

## 2014-01-29 HISTORY — PX: LEFT HEART CATHETERIZATION WITH CORONARY ANGIOGRAM: SHX5451

## 2014-01-29 LAB — CBC
HEMATOCRIT: 38.9 % (ref 36.0–46.0)
HEMOGLOBIN: 13.1 g/dL (ref 12.0–15.0)
MCH: 30.3 pg (ref 26.0–34.0)
MCHC: 33.7 g/dL (ref 30.0–36.0)
MCV: 89.8 fL (ref 78.0–100.0)
Platelets: 127 10*3/uL — ABNORMAL LOW (ref 150–400)
RBC: 4.33 MIL/uL (ref 3.87–5.11)
RDW: 13.4 % (ref 11.5–15.5)
WBC: 5.2 10*3/uL (ref 4.0–10.5)

## 2014-01-29 LAB — I-STAT TROPONIN, ED
TROPONIN I, POC: 0.12 ng/mL — AB (ref 0.00–0.08)
Troponin i, poc: 0.14 ng/mL (ref 0.00–0.08)

## 2014-01-29 LAB — BASIC METABOLIC PANEL
BUN: 26 mg/dL — ABNORMAL HIGH (ref 6–23)
CO2: 22 meq/L (ref 19–32)
Calcium: 9.9 mg/dL (ref 8.4–10.5)
Chloride: 105 mEq/L (ref 96–112)
Creatinine, Ser: 1 mg/dL (ref 0.50–1.10)
GFR calc Af Amer: 57 mL/min — ABNORMAL LOW (ref >90)
GFR calc non Af Amer: 50 mL/min — ABNORMAL LOW (ref >90)
GLUCOSE: 122 mg/dL — AB (ref 70–99)
POTASSIUM: 4.3 meq/L (ref 3.7–5.3)
SODIUM: 140 meq/L (ref 137–147)

## 2014-01-29 LAB — MRSA PCR SCREENING: MRSA BY PCR: NEGATIVE

## 2014-01-29 LAB — PRO B NATRIURETIC PEPTIDE: PRO B NATRI PEPTIDE: 2970 pg/mL — AB (ref 0–450)

## 2014-01-29 LAB — PROTIME-INR
INR: 1.05 (ref 0.00–1.49)
INR: 1.14 (ref 0.00–1.49)
PROTHROMBIN TIME: 13.5 s (ref 11.6–15.2)
Prothrombin Time: 14.4 seconds (ref 11.6–15.2)

## 2014-01-29 LAB — MAGNESIUM: Magnesium: 2.1 mg/dL (ref 1.5–2.5)

## 2014-01-29 LAB — TROPONIN I: TROPONIN I: 0.3 ng/mL — AB (ref <0.30)

## 2014-01-29 LAB — APTT: APTT: 29 s (ref 24–37)

## 2014-01-29 SURGERY — LEFT HEART CATHETERIZATION WITH CORONARY ANGIOGRAM
Anesthesia: LOCAL

## 2014-01-29 MED ORDER — ASPIRIN 81 MG PO CHEW: 81.0000 mg | CHEWABLE_TABLET | ORAL | Status: DC

## 2014-01-29 MED ORDER — ASPIRIN 81 MG PO CHEW: 81.0000 mg | CHEWABLE_TABLET | Freq: Every day | ORAL | Status: DC

## 2014-01-29 MED ORDER — SODIUM CHLORIDE 0.9 % IJ SOLN: 3.0000 mL | INTRAMUSCULAR | Status: DC | PRN

## 2014-01-29 MED ORDER — NITROGLYCERIN 0.4 MG SL SUBL
0.4000 mg | SUBLINGUAL_TABLET | SUBLINGUAL | Status: DC | PRN
  Administered 2014-01-29 (×2): 0.4 mg via SUBLINGUAL
  Filled 2014-01-29: qty 1

## 2014-01-29 MED ORDER — LIDOCAINE HCL (PF) 1 % IJ SOLN
INTRAMUSCULAR | Status: AC
  Filled 2014-01-29: qty 30

## 2014-01-29 MED ORDER — VERAPAMIL HCL 2.5 MG/ML IV SOLN
INTRAVENOUS | Status: AC
Start: 2014-01-29 — End: 2014-01-29
  Filled 2014-01-29: qty 2

## 2014-01-29 MED ORDER — SODIUM CHLORIDE 0.9 % IJ SOLN: 3.0000 mL | Freq: Two times a day (BID) | INTRAMUSCULAR | Status: DC

## 2014-01-29 MED ORDER — PANTOPRAZOLE SODIUM 40 MG PO TBEC: 40.0000 mg | DELAYED_RELEASE_TABLET | Freq: Every day | ORAL | Status: DC

## 2014-01-29 MED ORDER — HEPARIN (PORCINE) IN NACL 100-0.45 UNIT/ML-% IJ SOLN
750.0000 [IU]/h | INTRAMUSCULAR | Status: DC
  Administered 2014-01-29: 750 [IU]/h via INTRAVENOUS
  Filled 2014-01-29: qty 250

## 2014-01-29 MED ORDER — NITROGLYCERIN 0.4 MG SL SUBL: 0.4000 mg | SUBLINGUAL_TABLET | SUBLINGUAL | Status: DC | PRN

## 2014-01-29 MED ORDER — SODIUM CHLORIDE 0.9 % IV SOLN
1.0000 mL/kg/h | INTRAVENOUS | Status: AC
  Administered 2014-01-29: 1 mL/kg/h via INTRAVENOUS

## 2014-01-29 MED ORDER — NITROGLYCERIN 0.2 MG/ML ON CALL CATH LAB
INTRAVENOUS | Status: AC
  Filled 2014-01-29: qty 1

## 2014-01-29 MED ORDER — LEVOTHYROXINE SODIUM 75 MCG PO TABS
75.0000 ug | ORAL_TABLET | Freq: Every day | ORAL | Status: DC
  Filled 2014-01-29: qty 1

## 2014-01-29 MED ORDER — SODIUM CHLORIDE 0.9 % IV SOLN: 250.0000 mL | INTRAVENOUS | Status: DC | PRN

## 2014-01-29 MED ORDER — APIXABAN 5 MG PO TABS
5.0000 mg | ORAL_TABLET | Freq: Two times a day (BID) | ORAL | Status: DC
  Administered 2014-01-30 (×2): 5 mg via ORAL
  Filled 2014-01-29 (×3): qty 1

## 2014-01-29 MED ORDER — SODIUM CHLORIDE 0.9 % IV SOLN: INTRAVENOUS | Status: DC

## 2014-01-29 MED ORDER — SIMVASTATIN 20 MG PO TABS
20.0000 mg | ORAL_TABLET | Freq: Every day | ORAL | Status: DC
  Administered 2014-01-29: 21:00:00 20 mg via ORAL
  Filled 2014-01-29 (×3): qty 1

## 2014-01-29 MED ORDER — SODIUM CHLORIDE 0.9 % IJ SOLN
3.0000 mL | INTRAMUSCULAR | Status: DC | PRN
Start: 2014-01-29 — End: 2014-01-29

## 2014-01-29 MED ORDER — FUROSEMIDE 10 MG/ML IJ SOLN
20.0000 mg | Freq: Once | INTRAMUSCULAR | Status: AC
  Administered 2014-01-29: 20 mg via INTRAVENOUS
  Filled 2014-01-29: qty 2

## 2014-01-29 MED ORDER — ASPIRIN 325 MG PO TABS
325.0000 mg | ORAL_TABLET | Freq: Once | ORAL | Status: AC
  Administered 2014-01-29: 325 mg via ORAL
  Filled 2014-01-29: qty 1

## 2014-01-29 MED ORDER — MIDAZOLAM HCL 2 MG/2ML IJ SOLN
INTRAMUSCULAR | Status: AC
  Filled 2014-01-29: qty 2

## 2014-01-29 MED ORDER — CARVEDILOL 6.25 MG PO TABS
6.2500 mg | ORAL_TABLET | Freq: Two times a day (BID) | ORAL | Status: DC
  Administered 2014-01-29 – 2014-01-30 (×2): 6.25 mg via ORAL
  Filled 2014-01-29 (×2): qty 1
  Filled 2014-01-29: qty 2
  Filled 2014-01-29 (×2): qty 1

## 2014-01-29 MED ORDER — ONDANSETRON HCL 4 MG/2ML IJ SOLN: 4.0000 mg | Freq: Four times a day (QID) | INTRAMUSCULAR | Status: DC | PRN

## 2014-01-29 MED ORDER — SODIUM CHLORIDE 0.9 % IJ SOLN
3.0000 mL | Freq: Two times a day (BID) | INTRAMUSCULAR | Status: DC
  Administered 2014-01-29: 3 mL via INTRAVENOUS

## 2014-01-29 MED ORDER — ACETAMINOPHEN 325 MG PO TABS
650.0000 mg | ORAL_TABLET | ORAL | Status: DC | PRN
  Administered 2014-01-30: 650 mg via ORAL
  Filled 2014-01-29: qty 2

## 2014-01-29 MED ORDER — HEPARIN BOLUS VIA INFUSION
2500.0000 [IU] | Freq: Once | INTRAVENOUS | Status: AC
  Administered 2014-01-29: 2500 [IU] via INTRAVENOUS
  Filled 2014-01-29: qty 2500

## 2014-01-29 MED ORDER — HEPARIN (PORCINE) IN NACL 2-0.9 UNIT/ML-% IJ SOLN
INTRAMUSCULAR | Status: AC
  Filled 2014-01-29: qty 1000

## 2014-01-29 MED ORDER — SODIUM CHLORIDE 0.9 % IV SOLN: 1.0000 mL/kg/h | INTRAVENOUS | Status: DC
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
## 2014-01-29 NOTE — Progress Notes (Signed)
ANTICOAGULATION CONSULT NOTE - Initial Consult  Pharmacy Consult for Eliquis Indication: atrial fibrillation  No Known Allergies  Patient Measurements: Height: 4\' 7"  (139.7 cm) Weight: 152 lb 8.9 oz (69.2 kg) IBW/kg (Calculated) : 34  Vital Signs: Temp: 97.4 F (36.3 C) (03/18 2030) Temp src: Oral (03/18 2030) BP: 87/42 mmHg (03/18 2200) Pulse Rate: 72 (03/18 2200)  Labs:  Recent Labs  01/29/14 0530 01/29/14 1045 01/29/14 2038  HGB 13.1  --   --   HCT 38.9  --   --   PLT 127*  --   --   APTT  --  29  --   LABPROT  --  13.5 14.4  INR  --  1.05 1.14  CREATININE 1.00  --   --   TROPONINI  --   --  0.30*    Estimated Creatinine Clearance: 30.7 ml/min (by C-G formula based on Cr of 1).   Medical History: Past Medical History  Diagnosis Date  . Dyslipidemia   . Hypertension   . Palpitations     PVCs/bigeminy on event in May 2009 revealing this was relatively asymptomatic  . Chest pain   . Cardiovascular disease     moderate by USN 01/2010  . Degenerative joint disease   . Hx of varicella   . History of skin cancer     tafeen/ non melanoma.   . Thrombocytopenia   . Anemia   . Monoclonal gammopathies   . Right lower quadrant abdominal pain 07/19/2012    Recurrent  With nausea    This is new since she had ct for llq pain in [facility]    R/o appendiceal problem  Hernia  Get ct scan  And plan  Fu     . Diastolic CHF, acute on chronic 01/31/2012    Medications:  Prescriptions prior to admission  Medication Sig Dispense Refill  . aspirin 81 MG chewable tablet Chew 81 mg by mouth daily.      . carvedilol (COREG) 6.25 MG tablet Take 1 tablet (6.25 mg total) by mouth 2 (two) times daily.  90 tablet  2  . levothyroxine (SYNTHROID, LEVOTHROID) 75 MCG tablet Take 1 tablet (75 mcg total) by mouth daily.  90 tablet  2  . nitroGLYCERIN (NITROSTAT) 0.4 MG SL tablet Place 1 tablet (0.4 mg total) under the tongue every 5 (five) minutes as needed. For chest pain  25 tablet  6  .  simvastatin (ZOCOR) 20 MG tablet Take 1 tablet (20 mg total) by mouth at bedtime.  90 tablet  2   Scheduled:  . [START ON 01/30/2014] apixaban  5 mg Oral BID  . carvedilol  6.25 mg Oral BID  . simvastatin  20 mg Oral QHS   Infusions:  . sodium chloride 1 mL/kg/hr (01/29/14 1645)    Assessment: 80yo female was admitted for evaluation of CP, found to be in new Afib, s/p cath, to begin Eliquis for stroke prevention.   Plan:  Will begin Eliquis 5mg  po BID and begin Eliquis education; of note, pt does not meet criteria for lowered dose (two criteria from body wt <60kg, SCr >1.5, age>80) but given small stature and compromised CrCl, may need to consider risks/benefits of full-dose vs lowered dose.  Wynona Neat, PharmD, BCPS  01/29/2014,11:51 PM

## 2014-01-29 NOTE — ED Notes (Signed)
Please call Sherron Monday [phone] pt's daughter with updates

## 2014-01-29 NOTE — ED Notes (Signed)
Troponin results given to Dr. Sheldon 

## 2014-01-29 NOTE — ED Notes (Signed)
Troponin results reported to Dr.Bednar

## 2014-01-29 NOTE — ED Notes (Signed)
[phone] [facility] Daughter.

## 2014-01-29 NOTE — Progress Notes (Signed)
Reviewed cath findings with the patient and her daughter. She has new atrial fibrillation and should be anticoagulated. Long discussion with patient and daughter about options of warfarin versus novel [place]. Favor eliquis 5 mg BID. Will ask case manager to check on copay. Anticipate d/c in am.  Sherren Mocha 01/29/2014 Now
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
## 2014-01-29 NOTE — ED Notes (Signed)
Cardiology at bedside.

## 2014-01-29 NOTE — Interval H&P Note (Signed)
History and Physical Interval Note:  01/29/2014 3:33 PM  [name]  has presented today for surgery, with the diagnosis of cp  The various methods of treatment have been discussed with the patient and family. After consideration of risks, benefits and other options for treatment, the patient has consented to  Procedure(s): LEFT HEART CATHETERIZATION WITH CORONARY ANGIOGRAM (N/A) as a surgical intervention .  The patient's history has been reviewed, patient examined, no change in status, stable for surgery.  I have reviewed the patient's chart and labs.  Questions were answered to the patient's satisfaction.    Cath Lab Visit (complete for each Cath Lab visit)  Clinical Evaluation Leading to the Procedure:   ACS: yes  Non-ACS:    Anginal Classification: CCS IV  Anti-ischemic medical therapy: Minimal Therapy (1 class of medications)  Non-Invasive Test Results: No non-invasive testing performed  Prior CABG: No previous CABG       Sherren Mocha

## 2014-01-29 NOTE — Progress Notes (Signed)
ANTICOAGULATION CONSULT NOTE - Initial Consult  Pharmacy Consult for heparin Indication: chest pain/ACS  No Known Allergies  Patient Measurements: Height: 4\' 9"  (144.8 cm) Weight: 156 lb 8.4 oz (71 kg) IBW/kg (Calculated) : 38.6 Heparin Dosing Weight: 61kg  Vital Signs: Temp: 97.9 F (36.6 C) (03/18 0508) Temp src: Oral (03/18 0508) BP: 110/56 mmHg (03/18 0930) Pulse Rate: 76 (03/18 0930)  Labs:  Recent Labs  01/29/14 0530  HGB 13.1  HCT 38.9  PLT 127*  CREATININE 1.00    Estimated Creatinine Clearance: 32.9 ml/min (by C-G formula based on Cr of 1).   Medical History: Past Medical History  Diagnosis Date  . Dyslipidemia   . Hypertension   . Palpitations     PVCs/bigeminy on event in May 2009 revealing this was relatively asymptomatic  . Chest pain   . Cardiovascular disease     moderate by USN 01/2010  . Degenerative joint disease   . Hx of varicella   . History of skin cancer     tafeen/ non melanoma.   . Thrombocytopenia   . Anemia   . Monoclonal gammopathies   . Right lower quadrant abdominal pain 07/19/2012    Recurrent  With nausea    This is new since she had ct for llq pain in [facility]    R/o appendiceal problem  Hernia  Get ct scan  And plan  Fu     . Diastolic CHF, acute on chronic 01/31/2012    Assessment: 63 YOF brought in with CP after she underwent a stressful situation and also noted to have AFib. To start heparin for plans to undergo cath to define anatomy. She was not on an anticoagulant PTA. Baseline Hgb 13.1, plts low at 127. No bleeding noted.  Goal of Therapy:  Heparin level 0.3-0.7 units/ml Monitor platelets by anticoagulation protocol: Yes   Plan:   1. Heparin bolus with 2500 units IV x1 2. Start heparin drip at 750units/hr 3. Heparin level at 2000 vs f/u for anticoagulation plans after cath 4. Daily HL and CBC while on heparin 5. Follow for s/s bleeding  Timara Loma D. Emeterio Balke, PharmD, BCPS Clinical Pharmacist Pager:  [phone] 01/29/2014 10:40 AM
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
## 2014-01-29 NOTE — CV Procedure (Signed)
    Cardiac Catheterization Procedure Note  Name: ADDISEN CHAPPELLE MRN: [id number] DOB: [birth year]  Procedure: Left Heart Cath, Selective Coronary Angiography, LV angiography  Indication: Chest pain with abnormal troponin   Procedural Details: The right wrist was prepped, draped, and anesthetized with 1% lidocaine. Using the modified Seldinger technique, a 5 French sheath was introduced into the right radial artery. 3 mg of verapamil was administered through the sheath, weight-based unfractionated heparin was administered intravenously. Standard Judkins catheters were used for selective coronary angiography and left ventriculography. The right coronary artery was difficult to engage. An AR-1 catheter was utilized. Catheter exchanges were performed over an exchange length guidewire. There were no immediate procedural complications. A TR band was used for radial hemostasis at the completion of the procedure.  The patient was transferred to the post catheterization recovery area for further monitoring.  Procedural Findings: Hemodynamics: AO 122/59 LV 132/13  Coronary angiography: Coronary dominance: right  Left mainstem: The left main is patent without obstructive disease. There is diffuse irregularity noted. The mid left main has 20-30% stenosis and there is moderate calcification noted.  Left anterior descending (LAD): The LAD is patent to the left ventricular apex. There is mild diffuse plaquing without significant stenosis. The first diagonal is patent.  Left circumflex (LCx): Left circumflex is patent. The first OM is patent without significant stenosis.  Right coronary artery (RCA): This is a large, dominant vessel. The vessel is moderately calcified. The mid vessel has 30-40% stenosis. The PDA and PLA branches are patent.  Left ventriculography: Left ventricular systolic function is vigorous, LVEF is estimated at 65%, there is no significant mitral regurgitation   Final  Conclusions:   1. Diffuse nonobstructive coronary artery disease 2. Normal left ventricular systolic function  Recommendations: Medical therapy for nonobstructive CAD.  Sherren Mocha 01/29/2014, 4:11 PM

## 2014-01-30 ENCOUNTER — Other Ambulatory Visit: Payer: Self-pay | Admitting: Nurse Practitioner

## 2014-01-30 ENCOUNTER — Encounter (HOSPITAL_COMMUNITY): Payer: Self-pay | Admitting: Nurse Practitioner

## 2014-01-30 DIAGNOSIS — I200 Unstable angina: Secondary | ICD-10-CM | POA: Insufficient documentation

## 2014-01-30 DIAGNOSIS — I4891 Unspecified atrial fibrillation: Secondary | ICD-10-CM

## 2014-01-30 LAB — BASIC METABOLIC PANEL
BUN: 27 mg/dL — AB (ref 6–23)
CO2: 24 mEq/L (ref 19–32)
Calcium: 9.4 mg/dL (ref 8.4–10.5)
Chloride: 106 mEq/L (ref 96–112)
Creatinine, Ser: 1.09 mg/dL (ref 0.50–1.10)
GFR calc non Af Amer: 45 mL/min — ABNORMAL LOW (ref >90)
GFR, EST AFRICAN AMERICAN: 52 mL/min — AB (ref >90)
GLUCOSE: 100 mg/dL — AB (ref 70–99)
POTASSIUM: 4.2 meq/L (ref 3.7–5.3)
SODIUM: 142 meq/L (ref 137–147)

## 2014-01-30 LAB — CBC
HCT: 36.5 % (ref 36.0–46.0)
HEMOGLOBIN: 12 g/dL (ref 12.0–15.0)
MCH: 29.7 pg (ref 26.0–34.0)
MCHC: 32.9 g/dL (ref 30.0–36.0)
MCV: 90.3 fL (ref 78.0–100.0)
PLATELETS: 118 10*3/uL — AB (ref 150–400)
RBC: 4.04 MIL/uL (ref 3.87–5.11)
RDW: 13.6 % (ref 11.5–15.5)
WBC: 5.5 10*3/uL (ref 4.0–10.5)

## 2014-01-30 LAB — LIPID PANEL
Cholesterol: 136 mg/dL (ref 0–200)
HDL: 89 mg/dL (ref >39)
LDL Cholesterol: 36 mg/dL (ref 0–99)
Total CHOL/HDL Ratio: 1.5 RATIO
Triglycerides: 54 mg/dL (ref <150)
VLDL: 11 mg/dL (ref 0–40)

## 2014-01-30 LAB — TROPONIN I: Troponin I: <0.3 ng/mL (ref <0.30)

## 2014-01-30 LAB — TSH: TSH: 2.463 u[IU]/mL (ref 0.350–4.500)

## 2014-01-30 MED ORDER — APIXABAN 5 MG PO TABS: 5.0000 mg | ORAL_TABLET | Freq: Two times a day (BID) | ORAL | Status: DC

## 2014-01-30 MED ORDER — OFF THE BEAT BOOK
Freq: Once | Status: AC
  Administered 2014-01-30: 07:00:00
  Filled 2014-01-30: qty 1

## 2014-01-30 NOTE — Telephone Encounter (Signed)
Left message for pt to call.

## 2014-01-30 NOTE — Progress Notes (Signed)
TR BAND REMOVAL  LOCATION:    right radial  DEFLATED PER PROTOCOL:    yes  TIME BAND OFF / DRESSING APPLIED:    20:30   SITE UPON ARRIVAL:    Level 0  SITE AFTER BAND REMOVAL:    Level 0  REVERSE ALLEN'S TEST:     positive  CIRCULATION SENSATION AND MOVEMENT:    Within Normal Limits   yes  COMMENTS:   Pt tolerated removal of TR band without complications.  Will continue to monitor pt.

## 2014-01-30 NOTE — Telephone Encounter (Signed)
Message from dr Harrington Challenger     Please place patinet on my schedule        Will discuss with dr Harrington Challenger tomorrow and call the pt.

## 2014-01-30 NOTE — Care Management Note (Addendum)
    Page 1 of 1   01/30/2014     1:00:39 PM   CARE MANAGEMENT NOTE 01/30/2014  Patient:  [name], [name]   Account Number:  [id number]  Date Initiated:  01/30/2014  Documentation initiated by:  [facility]  Subjective/Objective Assessment:   80 yo WF with history of HTN, hyperlipidemia and PVCs.//Home alone     Action/Plan:   Procedure:  LEFT HEART CATHETERIZATION WITH CORONARY ANGIOGRAM//Benefits check for Eliquis   Anticipated DC Date:  01/30/2014   Anticipated DC Plan:  [place]  CM consult      Choice offered to / List presented to:             Status of service:  In process, will continue to follow Medicare Important Message given?   (If response is "NO", the following Medicare IM given date fields will be blank) Date Medicare IM given:   Date Additional Medicare IM given:    Discharge Disposition:    Per UR Regulation:    If discussed at Long Length of Stay Meetings, dates discussed:    Comments:  01/30/14 [place], RN, BSN, NCM [phone] Spoke with pt at bedside regarding benefits check for Eliquis.  Pt has brochure with 30 day free card and refill assistance card intact.  Pt utilizes [facility] on [address] for prescription needs.  NCM called pharmacy to confirm availability of medication.  Information relayed to pt.  Pt verbalizes importance of filling medication upon discharge.  PER REP AT [place]- 47 DAY SUPPLY$80.00 NO AUTH REQUIRED [facility], [place], [facility], [facility]
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
## 2014-01-30 NOTE — Telephone Encounter (Signed)
I would keep on the changes she has made and see how she feels. Call back in a couple wks She had just isolated skips on monitor

## 2014-01-30 NOTE — Progress Notes (Signed)
Utilization Review Completed Tomia Enlow J. Tashira Torre, RN, BSN, NCM [phone]  
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
## 2014-01-30 NOTE — Discharge Instructions (Signed)

## 2014-01-31 ENCOUNTER — Telehealth: Payer: Self-pay | Admitting: Internal Medicine

## 2014-01-31 NOTE — Telephone Encounter (Signed)
Spoke with pt, Follow up scheduled  

## 2014-01-31 NOTE — Telephone Encounter (Signed)
New problem     Pt would like a call back.  Pt is very dizzy since she came back from the hospital.  She thinks medication maybe the reason??

## 2014-02-03 ENCOUNTER — Encounter: Payer: Self-pay | Admitting: Internal Medicine

## 2014-02-03 ENCOUNTER — Ambulatory Visit (INDEPENDENT_AMBULATORY_CARE_PROVIDER_SITE_OTHER): Payer: Medicare Other | Admitting: Internal Medicine

## 2014-02-03 VITALS — BP 113/58 | HR 73 | Ht <= 58 in | Wt 157.0 lb

## 2014-02-03 DIAGNOSIS — I4891 Unspecified atrial fibrillation: Secondary | ICD-10-CM
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
## 2014-02-03 NOTE — Telephone Encounter (Signed)
Seen by Dr. Harrington Challenger today in clinic.

## 2014-02-03 NOTE — Patient Instructions (Signed)
Your physician wants you to follow-up in: 5 MONTHS WITH DR ROSS You will receive a reminder letter in the mail two months in advance. If you don't receive a letter, please call our office to schedule the follow-up appointment.  

## 2014-02-07 ENCOUNTER — Telehealth: Payer: Self-pay | Admitting: Internal Medicine

## 2014-02-07 NOTE — Telephone Encounter (Signed)
New message  ° ° °Returning call back to nurse.  °

## 2014-02-07 NOTE — Telephone Encounter (Signed)
Spoke with pt, she is returning a call from an old message. She is more fatigued than usual but thinks it maybe due to recent hosp. Her hosp labs discussed with the pt. She will call back with concerns.

## 2014-02-17 ENCOUNTER — Other Ambulatory Visit (HOSPITAL_COMMUNITY): Payer: Medicare Other

## 2014-02-17 ENCOUNTER — Encounter: Payer: Medicare Other | Admitting: Nurse Practitioner

## 2014-02-19 ENCOUNTER — Encounter: Payer: Self-pay | Admitting: Internal Medicine

## 2014-02-19 ENCOUNTER — Telehealth: Payer: Self-pay | Admitting: Internal Medicine

## 2014-02-19 ENCOUNTER — Telehealth: Payer: Self-pay | Admitting: Family Medicine

## 2014-02-19 ENCOUNTER — Ambulatory Visit (INDEPENDENT_AMBULATORY_CARE_PROVIDER_SITE_OTHER)
Admission: RE | Admit: 2014-02-19 | Discharge: 2014-02-19 | Disposition: A | Discharge status: Home / Self Care | Payer: Medicare Other | Source: Ambulatory Visit | Attending: Internal Medicine | Admitting: Internal Medicine

## 2014-02-19 ENCOUNTER — Ambulatory Visit (INDEPENDENT_AMBULATORY_CARE_PROVIDER_SITE_OTHER): Payer: Medicare Other | Admitting: Internal Medicine

## 2014-02-19 VITALS — BP 154/84 | HR 112 | Temp 99.4°F | Wt 160.0 lb

## 2014-02-19 DIAGNOSIS — I4891 Unspecified atrial fibrillation: Secondary | ICD-10-CM

## 2014-02-19 DIAGNOSIS — R0602 Shortness of breath: Secondary | ICD-10-CM

## 2014-02-19 DIAGNOSIS — I10 Essential (primary) hypertension: Secondary | ICD-10-CM

## 2014-02-19 DIAGNOSIS — I5033 Acute on chronic diastolic (congestive) heart failure: Secondary | ICD-10-CM

## 2014-02-19 DIAGNOSIS — I509 Heart failure, unspecified: Secondary | ICD-10-CM

## 2014-02-19 DIAGNOSIS — I2510 Atherosclerotic heart disease of native coronary artery without angina pectoris: Secondary | ICD-10-CM

## 2014-02-19 NOTE — Patient Instructions (Addendum)
We need to arrange   Fu with cardiology within the week..  Chest x ray  [place] building  ekg : shows higher rate that may not be comfortable .  May need to increase medication, dosing in the short run.

## 2014-02-19 NOTE — Telephone Encounter (Signed)
Received a call from [place] at [facility] [employer]Stated patient is on their schedule to be seen today for sob,she wanted to make sure patient did'nt need to come to see Dr.Ross.Dr.Ross out of office this week.They will see patient and call back if patient needs to see Dr.Ross.

## 2014-02-19 NOTE — Telephone Encounter (Signed)
New message  Pt called states that she is having trouble breathing// this has started within the last 24 Hrs. Requests a same day appt with Dr. Harrington Challenger.. Pt is not sure if it is due to the eliquis or not.. she is requesting a call back to discuss.

## 2014-02-19 NOTE — Telephone Encounter (Signed)
I spoke with pt to get more information and she said that she was laying in bed last night and she woke up feeling short of breath. It has continued this morning. She does not have any chest pain or any swelling. I called Dr. Harrington Challenger with cardiology and she is out of the office this week. Pt is going to stay on the schedule for noon today and Dr.Panosh is aware of this.

## 2014-02-19 NOTE — Progress Notes (Signed)
Pre visit review using our clinic review tool, if applicable. No additional management support is needed unless otherwise documented below in the visit note.   Chief Complaint  Patient presents with  . Shortness of Breath    Started last month after  hospitalization    HPI: Patient comes in today for SDA for  acute problem evaluation. She was recently discharged from a short hospitalization on 318 when she presented with acute stressful event chest pain shortness of breath where catheterization showed noncritical cardiac obstruction but acute on chronic diastolic heart failure with elevated BNP.  Non stemi MI by markers She also developed atrial fibrillation was new onset. She saw Dr. Ross/cardiology in followup on 323. She had been on an ACE inhibitor which was stopped and switched over to Coreg had a recent increase in her dose a couple weeks ago. No obvious side effects.   She called today to be seen today because of shortness of breath that came on when laying down at night and is also noticed decreased exercise tolerance and walking across the room. This all his have been occurring since her hospitalization and not just last night.  Ok breathing at time after leaving hhosptial but then slowly is getting back Then having  Sob laying down   Heart racing ? When this happens possibly uncertain if anxiety could be related because of the stressful episode at home but doesn't think so Denies a specific cough leg swelling or significant weight gain. Sob across room sob.  ROS: See pertinent positives and negatives per HPI. No bleeding she is on an anticoagulant and no diuretic. She's currently having no chest pain. She called cardiology at our device but hasn't heard yet.  Past Medical History  Diagnosis Date  . Dyslipidemia   . Hypertension   . Palpitations     PVCs/bigeminy on event in May 2009 revealing this was relatively asymptomatic  . Chest pain     a. 2011 Neg MV;  b. 01/2014 Cath:  LM 20-30, LAD nl, D1 nl, LCX nl, OM1 nl, RCA dom 30-88m, PD/PL nl, EF 65%->Med Rx.  . Degenerative joint disease   . Hx of varicella   . History of skin cancer     tafeen/ non melanoma.   . Thrombocytopenia   . Anemia   . Monoclonal gammopathies   . Right lower quadrant abdominal pain 07/19/2012    Recurrent  With nausea    This is new since she had ct for llq pain in [facility]    R/o appendiceal problem  Hernia  Get ct scan  And plan  Fu     . Diastolic CHF, acute on chronic 01/31/2012  . Atrial fibrillation     a. Dx 01/2014->Eliquis started.    Family History  Problem Relation Age of Onset  . Coronary artery disease Father   . Heart disease Father   . Lung cancer    . Alzheimer's disease Mother   . Cancer Brother 74    lung cancer    History   Social History  . Marital Status: Widowed    Spouse Name: N/A    Number of Children: 3  . Years of Education: N/A   Occupational History  .      [employer], book keeping   Social History Main Topics  . Smoking status: Never Smoker   . Smokeless tobacco: Never Used  . Alcohol Use: No  . Drug Use: No  . Sexual Activity: Not Currently  Other Topics Concern  . 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 Eats with her.    Outpatient Encounter Prescriptions as of 02/19/2014  Medication Sig  . apixaban (ELIQUIS) 5 MG TABS tablet Take 1 tablet (5 mg total) by mouth 2 (two) times daily.  Marland Kitchen levothyroxine (SYNTHROID, LEVOTHROID) 75 MCG tablet Take 1 tablet (75 mcg total) by mouth daily.  . nitroGLYCERIN (NITROSTAT) 0.4 MG SL tablet Place 1 tablet (0.4 mg total) under the tongue every 5 (five) minutes as needed. For chest pain  . simvastatin (ZOCOR) 20 MG tablet Take 1 tablet (20 mg total) by mouth at bedtime.  .  [DISCONTINUED] carvedilol (COREG) 6.25 MG tablet Take 1 tablet (6.25 mg total) by mouth 2 (two) times daily.  . carvedilol (COREG) 12.5 MG tablet Take 1 tablet (12.5 mg total) by mouth 2 (two) times daily with a meal.    EXAM:  BP 154/84  Pulse 112  Temp(Src) 99.4 F (37.4 C) (Oral)  Wt 160 lb (72.576 kg)  SpO2 96%  Body mass index is 37.19 kg/(m^2).  GENERAL: vitals reviewed and listed above, alert, oriented, appears well hydrated and in no acute distress slightly dyspneic when walking. HEENT: atraumatic, conjunctiva  clear, no obvious abnormalities on inspection of external nose and ears OP : no lesion edema or exudate  NECK: no obvious masses on inspection palpation no elevated JVD right above left click that your line LUNGS: clear to auscultation bilaterally, no wheezes, rales or rhonchi, breath sounds are equal CV: Heart rate irregularly irregular and speeds up and then slows down counted rate by me was about 90 EKG 111 no clubbing cyanosis minimal trace  peripheral edema nl cap refill  MS: moves all extremities without noticeable focal  abnormality PSYCH: pleasant and cooperative cognitively intact. Skin no acute bruising or bleeding.  Lab Results  Component Value Date   WBC 5.5 01/30/2014   HGB 12.0 01/30/2014   HCT 36.5 01/30/2014   PLT 118* 01/30/2014   GLUCOSE 100* 01/30/2014   CHOL 136 01/30/2014   TRIG 54 01/30/2014   HDL 89 01/30/2014   LDLCALC 36 01/30/2014   ALT 10 11/01/2013   AST 15 11/01/2013   NA 142 01/30/2014   K 4.2 01/30/2014   CL 106 01/30/2014   CREATININE 1.09 01/30/2014   BUN 27* 01/30/2014   CO2 24 01/30/2014   TSH 2.463 01/29/2014   INR 1.14 01/29/2014   EKG shows atrial fibrillation rate of about 111  ASSESSMENT AND PLAN:  Discussed the following assessment and plan:  Shortness of breath - Plan: DG Chest 2 View, EKG 12-Lead  Atrial fibrillation - Plan: DG Chest 2 View  Diastolic CHF, acute on chronic - no acute findings on exam   Coronary disease  non obstucted  - cath 3 15  Hypertension - up today  Recent hospitalization her symptoms seem more cardiac than pulmonary see me perhaps focus on decreasing her heart rate no acute CHF symptoms on clinical exam we'll get chest x-ray We will contact cardiology office again in plan on getting her seen sooner or advice for interim care if she gets severely symptomatic she should contact on  call service again in or go to the emergency room. She appears to be stable at this moment. -Patient advised to return or notify health care team  if symptoms worsen ,persist or new concerns arise.  Patient Instructions  We need to arrange   Fu with cardiology within the week..  Chest x ray  [facility]  ekg : shows higher rate that may not be comfortable .  May need to increase medication, dosing in the short run.    Standley Brooking. Panosh M.D. Total visit 46mins > 50% spent counseling and coordinating care    Chest x-ray shows improvement in her CHF reported to patient will contact cardiology  Discussed with Dr. Radford Pax today on Thursday, 02/20/2014 She agreed increasing dose to carvedilol 12.5 twice a day until she is seen next week she will have an appointment next Thursday for followup. No change in symptoms today seek care if worsening in the meantime.

## 2014-02-20 ENCOUNTER — Telehealth: Payer: Self-pay | Admitting: Cardiology

## 2014-02-20 DIAGNOSIS — I2510 Atherosclerotic heart disease of native coronary artery without angina pectoris: Secondary | ICD-10-CM | POA: Insufficient documentation

## 2014-02-20 DIAGNOSIS — I10 Essential (primary) hypertension: Secondary | ICD-10-CM | POA: Insufficient documentation

## 2014-02-20 MED ORDER — CARVEDILOL 12.5 MG PO TABS: 12.5000 mg | ORAL_TABLET | Freq: Two times a day (BID) | ORAL | Status: DC

## 2014-02-20 NOTE — Telephone Encounter (Signed)
Received call from Dr. Keenan Bachelor at [facility].  Patient seen today in their office due to SOB.  She just had a cath showing nonobstructive ASCAD and had afib.  She was treated for acute on chronic diastolic CHF in the hospital recently at the time of her cath.  According to Dr. Keenan Bachelor she does not have any evidence of CHF on exam today but her HR is running 90-110bpm.  Instructed her to increase Coreg to 12.5MG  BID and followup with Dr. Harrington Challenger next week.

## 2014-02-24 ENCOUNTER — Emergency Department (HOSPITAL_COMMUNITY): Payer: Medicare Other

## 2014-02-24 ENCOUNTER — Telehealth: Payer: Self-pay | Admitting: Internal Medicine

## 2014-02-24 ENCOUNTER — Inpatient Hospital Stay (HOSPITAL_COMMUNITY)
Admission: EM | Admit: 2014-02-24 | Discharge: 2014-02-27 | DRG: 308 | Disposition: A | Discharge status: Home / Self Care | Payer: Medicare Other | Attending: Cardiovascular Disease | Admitting: Cardiovascular Disease

## 2014-02-24 ENCOUNTER — Encounter (HOSPITAL_COMMUNITY): Payer: Self-pay | Admitting: Emergency Medicine

## 2014-02-24 DIAGNOSIS — I10 Essential (primary) hypertension: Secondary | ICD-10-CM | POA: Diagnosis present

## 2014-02-24 DIAGNOSIS — I509 Heart failure, unspecified: Secondary | ICD-10-CM

## 2014-02-24 DIAGNOSIS — I2510 Atherosclerotic heart disease of native coronary artery without angina pectoris: Secondary | ICD-10-CM | POA: Diagnosis present

## 2014-02-24 DIAGNOSIS — I4891 Unspecified atrial fibrillation: Principal | ICD-10-CM

## 2014-02-24 DIAGNOSIS — M549 Dorsalgia, unspecified: Secondary | ICD-10-CM

## 2014-02-24 DIAGNOSIS — R079 Chest pain, unspecified: Secondary | ICD-10-CM

## 2014-02-24 DIAGNOSIS — I5032 Chronic diastolic (congestive) heart failure: Secondary | ICD-10-CM | POA: Diagnosis present

## 2014-02-24 DIAGNOSIS — Z7901 Long term (current) use of anticoagulants: Secondary | ICD-10-CM

## 2014-02-24 DIAGNOSIS — Z8249 Family history of ischemic heart disease and other diseases of the circulatory system: Secondary | ICD-10-CM

## 2014-02-24 DIAGNOSIS — R001 Bradycardia, unspecified: Secondary | ICD-10-CM | POA: Diagnosis present

## 2014-02-24 DIAGNOSIS — D696 Thrombocytopenia, unspecified: Secondary | ICD-10-CM | POA: Diagnosis present

## 2014-02-24 DIAGNOSIS — E039 Hypothyroidism, unspecified: Secondary | ICD-10-CM | POA: Diagnosis present

## 2014-02-24 DIAGNOSIS — I214 Non-ST elevation (NSTEMI) myocardial infarction: Secondary | ICD-10-CM

## 2014-02-24 DIAGNOSIS — I5033 Acute on chronic diastolic (congestive) heart failure: Secondary | ICD-10-CM

## 2014-02-24 DIAGNOSIS — E785 Hyperlipidemia, unspecified: Secondary | ICD-10-CM | POA: Diagnosis present

## 2014-02-24 LAB — BASIC METABOLIC PANEL
BUN: 26 mg/dL — ABNORMAL HIGH (ref 6–23)
CALCIUM: 9.6 mg/dL (ref 8.4–10.5)
CHLORIDE: 110 meq/L (ref 96–112)
CO2: 22 meq/L (ref 19–32)
CREATININE: 1.18 mg/dL — AB (ref 0.50–1.10)
GFR calc Af Amer: 47 mL/min — ABNORMAL LOW (ref >90)
GFR calc non Af Amer: 41 mL/min — ABNORMAL LOW (ref >90)
GLUCOSE: 103 mg/dL — AB (ref 70–99)
Potassium: 4.7 mEq/L (ref 3.7–5.3)
Sodium: 145 mEq/L (ref 137–147)

## 2014-02-24 LAB — PROTIME-INR
INR: 1.43 (ref 0.00–1.49)
Prothrombin Time: 17.1 seconds — ABNORMAL HIGH (ref 11.6–15.2)

## 2014-02-24 LAB — I-STAT TROPONIN, ED: Troponin i, poc: 0.11 ng/mL (ref 0.00–0.08)

## 2014-02-24 LAB — TROPONIN I

## 2014-02-24 LAB — CBC
HCT: 36.9 % (ref 36.0–46.0)
Hemoglobin: 11.9 g/dL — ABNORMAL LOW (ref 12.0–15.0)
MCH: 29.2 pg (ref 26.0–34.0)
MCHC: 32.2 g/dL (ref 30.0–36.0)
MCV: 90.7 fL (ref 78.0–100.0)
PLATELETS: 125 10*3/uL — AB (ref 150–400)
RBC: 4.07 MIL/uL (ref 3.87–5.11)
RDW: 13.6 % (ref 11.5–15.5)
WBC: 4.8 10*3/uL (ref 4.0–10.5)

## 2014-02-24 LAB — PRO B NATRIURETIC PEPTIDE: Pro B Natriuretic peptide (BNP): 3537 pg/mL — ABNORMAL HIGH (ref 0–450)

## 2014-02-24 MED ORDER — ASPIRIN 325 MG PO TABS
325.0000 mg | ORAL_TABLET | Freq: Once | ORAL | Status: AC
  Administered 2014-02-24: 325 mg via ORAL
  Filled 2014-02-24: qty 1

## 2014-02-24 MED ORDER — FUROSEMIDE 10 MG/ML IJ SOLN
40.0000 mg | Freq: Once | INTRAMUSCULAR | Status: AC
  Administered 2014-02-25: 40 mg via INTRAVENOUS
  Filled 2014-02-24: qty 4

## 2014-02-24 MED ORDER — NITROGLYCERIN 0.4 MG SL SUBL
0.4000 mg | SUBLINGUAL_TABLET | SUBLINGUAL | Status: DC | PRN
  Administered 2014-02-24: 0.4 mg via SUBLINGUAL
  Filled 2014-02-24: qty 1

## 2014-02-24 MED ORDER — APIXABAN 5 MG PO TABS
5.0000 mg | ORAL_TABLET | Freq: Once | ORAL | Status: AC
  Administered 2014-02-24: 5 mg via ORAL
  Filled 2014-02-24: qty 1

## 2014-02-24 NOTE — ED Notes (Signed)
Patient began having weird sensation in chest. RN sat patient upright and patient belched and felt better. Patient states this has been going on for years and belching always makes it feel better.

## 2014-02-24 NOTE — Telephone Encounter (Signed)
New Message  Pt states she received a letter to Schedule the ECHO. However, Pt states that she was advised in the hospital recently and was advised to cancel ECHO. Pt is requests call back for Confirmation.. Please assist

## 2014-02-24 NOTE — ED Notes (Signed)
One dose of nitro given. BP dropped from 419 to 96 systolic. MD notified.

## 2014-02-24 NOTE — ED Notes (Addendum)
i-stat Trop. 0.11ng/mL result given to Dr. Tawnya Crook
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
## 2014-02-24 NOTE — ED Notes (Signed)
PER EMS: pt from home with complaints of chest palpitations and sharp, left sided non radiating CP associated with SOB all day. CP 7/10. Denies N/V/D. Hx of A-fib and pt currently in A-fib, HR-102. Given 2 nitro tabs and CP has since relieved. Pt on blood thinner. 20g IV LAC. Pt A&Ox4.

## 2014-02-24 NOTE — Telephone Encounter (Signed)
Spoke with pt, aware she needs a carotid. Patient voiced understanding and follow up scheduled.

## 2014-02-24 NOTE — ED Notes (Signed)
RN went into room to give nitro and ASA, patient in radiology at this time.

## 2014-02-24 NOTE — ED Provider Notes (Signed)
CSN: [id number]     Arrival date & time 02/24/14  1856 History   First MD Initiated Contact with Patient 02/24/14 1921     Chief Complaint  Patient presents with  . Chest Pain     (Consider location/radiation/quality/duration/timing/severity/associated sxs/prior Treatment) Patient is a 80 y.o. female presenting with chest pain. The history is provided by the patient. No language interpreter was used.  Chest Pain Pain location:  L chest Pain quality: pressure   Pain radiates to:  Does not radiate Pain radiates to the back: no   Pain severity:  Moderate Onset quality:  Sudden Duration:  20 minutes Timing:  Constant Progression:  Resolved (resolved after 2 SL NTG) Chronicity:  Recurrent Context: at rest   Relieved by:  Nitroglycerin Worsened by:  Nothing tried Ineffective treatments:  None tried Associated symptoms: fatigue and shortness of breath   Associated symptoms: no abdominal pain, no back pain, no cough, no diaphoresis, no fever, no headache, no nausea, no numbness, no palpitations, not vomiting and no weakness   Shortness of breath:    Severity:  Moderate   Onset quality:  Unable to specify   Duration:  1 day   Timing:  Constant   Progression:  Unchanged Risk factors: aortic disease, coronary artery disease, diabetes mellitus, high cholesterol and hypertension   Risk factors: not obese, no prior DVT/PE and no smoking     Past Medical History  Diagnosis Date  . Dyslipidemia   . Hypertension   . Palpitations     PVCs/bigeminy on event in May 2009 revealing this was relatively asymptomatic  . Chest pain     a. 2011 Neg MV;  b. 01/2014 Cath: LM 20-30, LAD nl, D1 nl, LCX nl, OM1 nl, RCA dom 30-96m, PD/PL nl, EF 65%->Med Rx.  . Degenerative joint disease   . Hx of varicella   . History of skin cancer     tafeen/ non melanoma.   . Thrombocytopenia   . Anemia   . Monoclonal gammopathies   . Right lower quadrant abdominal pain 07/19/2012    Recurrent  With nausea     This is new since she had ct for llq pain in [facility]    R/o appendiceal problem  Hernia  Get ct scan  And plan  Fu     . Diastolic CHF, acute on chronic 01/31/2012  . Atrial fibrillation     a. Dx 01/2014->Eliquis started.   Past Surgical History  Procedure Laterality Date  . Cholecystectomy  1999  . Cesarean section      times 2  . Shoulder surgery  1996    Right   . Cataract extraction      Bilateral  implantt   Family History  Problem Relation Age of Onset  . Coronary artery disease Father   . Heart disease Father   . Lung cancer    . Alzheimer's disease Mother   . Cancer Brother 66    lung cancer   History  Substance Use Topics  . Smoking status: Never Smoker   . Smokeless tobacco: Never Used  . Alcohol Use: No   OB History   Grav Para Term Preterm Abortions TAB SAB Ect Mult Living                 Review of Systems  Constitutional: Positive for fatigue. Negative for fever, chills, diaphoresis, activity change and appetite change.  HENT: Negative for congestion, facial swelling, rhinorrhea and sore throat.   Eyes: Negative  for photophobia and discharge.  Respiratory: Positive for shortness of breath. Negative for cough and chest tightness.   Cardiovascular: Positive for chest pain. Negative for palpitations and leg swelling.  Gastrointestinal: Negative for nausea, vomiting, abdominal pain and diarrhea.  Endocrine: Negative for polydipsia and polyuria.  Genitourinary: Negative for dysuria, frequency, difficulty urinating and pelvic pain.  Musculoskeletal: Negative for arthralgias, back pain, neck pain and neck stiffness.  Skin: Negative for color change and wound.  Allergic/Immunologic: Negative for immunocompromised state.  Neurological: Negative for facial asymmetry, weakness, numbness and headaches.  Hematological: Does not bruise/bleed easily.  Psychiatric/Behavioral: Negative for confusion and agitation.      Allergies  Review of patient's allergies indicates  no known allergies.  Home Medications   No current outpatient prescriptions on file. BP 121/48  Pulse 83  Temp(Src) 98 F (36.7 C) (Oral)  Resp 18  Ht 4\' 8"  (1.422 m)  Wt 151 lb 0.2 oz (68.5 kg)  BMI 33.88 kg/m2  SpO2 95% Physical Exam  Constitutional: She is oriented to person, place, and time. She appears well-developed and well-nourished. No distress.  HENT:  Head: Normocephalic and atraumatic.  Mouth/Throat: No oropharyngeal exudate.  Eyes: Pupils are equal, round, and reactive to light.  Neck: Normal range of motion. Neck supple.  Cardiovascular: Normal heart sounds.  An irregularly irregular rhythm present. Tachycardia present.  Exam reveals no gallop and no friction rub.   No murmur heard. Pulmonary/Chest: Effort normal and breath sounds normal. No respiratory distress. She has no wheezes. She has no rales.  Abdominal: Soft. Bowel sounds are normal. She exhibits no distension and no mass. There is no tenderness. There is no rebound and no guarding.  Musculoskeletal: Normal range of motion. She exhibits no edema and no tenderness.  Neurological: She is alert and oriented to person, place, and time.  Skin: Skin is warm and dry.  Psychiatric: She has a normal mood and affect.    ED Course  Procedures (including critical care time) Labs Review Labs Reviewed  PRO B NATRIURETIC PEPTIDE - Abnormal; Notable for the following:    Pro B Natriuretic peptide (BNP) 3537.0 (*)    All other components within normal limits  BASIC METABOLIC PANEL - Abnormal; Notable for the following:    Glucose, Bld 103 (*)    BUN 26 (*)    Creatinine, Ser 1.18 (*)    GFR calc non Af Amer 41 (*)    GFR calc Af Amer 47 (*)    All other components within normal limits  CBC - Abnormal; Notable for the following:    Hemoglobin 11.9 (*)    Platelets 125 (*)    All other components within normal limits  PROTIME-INR - Abnormal; Notable for the following:    Prothrombin Time 17.1 (*)    All other  components within normal limits  TSH - Abnormal; Notable for the following:    TSH 5.690 (*)    All other components within normal limits  CBC - Abnormal; Notable for the following:    Platelets 135 (*)    All other components within normal limits  BASIC METABOLIC PANEL - Abnormal; Notable for the following:    Glucose, Bld 111 (*)    BUN 26 (*)    Creatinine, Ser 1.25 (*)    GFR calc non Af Amer 38 (*)    GFR calc Af Amer 44 (*)    All other components within normal limits  PROTIME-INR - Abnormal; Notable for the following:  Prothrombin Time 17.2 (*)    All other components within normal limits  I-STAT TROPOININ, ED - Abnormal; Notable for the following:    Troponin i, poc 0.11 (*)    All other components within normal limits  TROPONIN I  TROPONIN I  TROPONIN I  TROPONIN I   Imaging Review Dg Chest 2 View  02/24/2014   CLINICAL DATA:  Left-sided chest pain.  Shortness of breath.  EXAM: CHEST  2 VIEW  COMPARISON:  02/19/2014 and 01/29/2014 and 11/01/2013  FINDINGS: There is persistent cardiomegaly with pulmonary vascular congestion. There is increased slight interstitial pulmonary edema with new small bilateral pleural effusions. Thoracolumbar scoliosis, unchanged.  IMPRESSION: Recurrent interstitial pulmonary edema with new small pleural effusions.   Electronically Signed   By: Rozetta Nunnery M.D.   On: 02/24/2014 20:43     EKG Interpretation   Date/Time:  Monday February 24 2014 19:04:33 EDT Ventricular Rate:  92 PR Interval:    QRS Duration: 80 QT Interval:  390 QTC Calculation: 482 R Axis:   41 Text Interpretation:  Atrial fibrillation Anteroseptal infarct, old    Cardiac Cath 01/29/2014: 1. Diffuse nonobstructive coronary artery disease  2. Normal left ventricular systolic function  MDM   Final diagnoses:  Atrial fibrillation  Chest pain, unspecified  NSTEMI (non-ST elevated myocardial infarction)  Acute on chronic diastolic CHF (congestive heart failure)    Pt  is a 80 y.o. female with Pmhx as above including recent dx of afib, NSTEMI, who presents with 1 day of malaise and SOB, with onset of CP around 6pm relieved by 2 SL NTG. Upon arrival, pt had no pain, on my exam, complains of a 1/10 L sided pressure w/o radiation. No fever, chills, cough, LE pain/edema. She has been compliant meds.  EKG with rate controlled afib w/o acute ischemic changes. CXR w/ recurrent interstitial pulmonary edema with new small pleural effusions. BNP >3500, somewhat elevated from prior. Istat trop 0.11.  EKG w/ rate controlled afib, no acute ischemic changes. Will consult cardiology for possible NSTEMI, though has proven nonobstructive CAD on recent cath. Will hold heparin as it is under 12 hrs since Eliquis dose.    10:20 PM Spoke w/ cardiology, requested lab trop be sent, and to give home eliquis as opposed to heparin. They will admit.      Neta Ehlers, MD 02/25/14 1025

## 2014-02-25 ENCOUNTER — Encounter (HOSPITAL_COMMUNITY): Payer: Self-pay | Admitting: *Deleted

## 2014-02-25 DIAGNOSIS — I369 Nonrheumatic tricuspid valve disorder, unspecified: Secondary | ICD-10-CM

## 2014-02-25 DIAGNOSIS — Z7901 Long term (current) use of anticoagulants: Secondary | ICD-10-CM

## 2014-02-25 LAB — CBC
HCT: 39.2 % (ref 36.0–46.0)
Hemoglobin: 12.8 g/dL (ref 12.0–15.0)
MCH: 29.4 pg (ref 26.0–34.0)
MCHC: 32.7 g/dL (ref 30.0–36.0)
MCV: 90.1 fL (ref 78.0–100.0)
PLATELETS: 135 10*3/uL — AB (ref 150–400)
RBC: 4.35 MIL/uL (ref 3.87–5.11)
RDW: 13.6 % (ref 11.5–15.5)
WBC: 5.1 10*3/uL (ref 4.0–10.5)

## 2014-02-25 LAB — BASIC METABOLIC PANEL
BUN: 26 mg/dL — ABNORMAL HIGH (ref 6–23)
CO2: 24 mEq/L (ref 19–32)
Calcium: 10 mg/dL (ref 8.4–10.5)
Chloride: 104 mEq/L (ref 96–112)
Creatinine, Ser: 1.25 mg/dL — ABNORMAL HIGH (ref 0.50–1.10)
GFR, EST AFRICAN AMERICAN: 44 mL/min — AB (ref >90)
GFR, EST NON AFRICAN AMERICAN: 38 mL/min — AB (ref >90)
Glucose, Bld: 111 mg/dL — ABNORMAL HIGH (ref 70–99)
Potassium: 3.9 mEq/L (ref 3.7–5.3)
SODIUM: 143 meq/L (ref 137–147)

## 2014-02-25 LAB — TROPONIN I
Troponin I: <0.3 ng/mL (ref <0.30)
Troponin I: <0.3 ng/mL (ref <0.30)

## 2014-02-25 LAB — PROTIME-INR
INR: 1.44 (ref 0.00–1.49)
Prothrombin Time: 17.2 seconds — ABNORMAL HIGH (ref 11.6–15.2)

## 2014-02-25 LAB — TSH: TSH: 5.69 u[IU]/mL — AB (ref 0.350–4.500)

## 2014-02-25 MED ORDER — SODIUM CHLORIDE 0.9 % IV SOLN
250.0000 mL | INTRAVENOUS | Status: DC | PRN
Start: 2014-02-25 — End: 2014-02-27

## 2014-02-25 MED ORDER — LEVOTHYROXINE SODIUM 75 MCG PO TABS
75.0000 ug | ORAL_TABLET | Freq: Every day | ORAL | Status: DC
  Administered 2014-02-25 – 2014-02-27 (×3): 75 ug via ORAL
  Filled 2014-02-25 (×4): qty 1

## 2014-02-25 MED ORDER — ASPIRIN 81 MG PO CHEW: 324.0000 mg | CHEWABLE_TABLET | ORAL | Status: AC

## 2014-02-25 MED ORDER — SODIUM CHLORIDE 0.9 % IJ SOLN
3.0000 mL | Freq: Two times a day (BID) | INTRAMUSCULAR | Status: DC
  Administered 2014-02-25 – 2014-02-27 (×4): 3 mL via INTRAVENOUS

## 2014-02-25 MED ORDER — DIAZEPAM 5 MG PO TABS
5.0000 mg | ORAL_TABLET | Freq: Two times a day (BID) | ORAL | Status: DC | PRN
  Administered 2014-02-25: 5 mg via ORAL
  Filled 2014-02-25: qty 1

## 2014-02-25 MED ORDER — NITROGLYCERIN 0.4 MG SL SUBL: 0.4000 mg | SUBLINGUAL_TABLET | SUBLINGUAL | Status: DC | PRN

## 2014-02-25 MED ORDER — CARVEDILOL 6.25 MG PO TABS
6.2500 mg | ORAL_TABLET | Freq: Two times a day (BID) | ORAL | Status: DC
  Administered 2014-02-25 – 2014-02-27 (×4): 6.25 mg via ORAL
  Filled 2014-02-25 (×7): qty 1

## 2014-02-25 MED ORDER — SIMVASTATIN 20 MG PO TABS
20.0000 mg | ORAL_TABLET | Freq: Every day | ORAL | Status: DC
  Administered 2014-02-25 – 2014-02-26 (×2): 20 mg via ORAL
  Filled 2014-02-25 (×4): qty 1

## 2014-02-25 MED ORDER — FUROSEMIDE 10 MG/ML IJ SOLN
40.0000 mg | Freq: Two times a day (BID) | INTRAMUSCULAR | Status: DC
  Administered 2014-02-25 – 2014-02-26 (×3): 40 mg via INTRAVENOUS
  Filled 2014-02-25 (×4): qty 4

## 2014-02-25 MED ORDER — ASPIRIN 300 MG RE SUPP
300.0000 mg | RECTAL | Status: AC
  Filled 2014-02-25: qty 1

## 2014-02-25 MED ORDER — APIXABAN 5 MG PO TABS
5.0000 mg | ORAL_TABLET | Freq: Two times a day (BID) | ORAL | Status: DC
  Administered 2014-02-25 – 2014-02-27 (×5): 5 mg via ORAL
  Filled 2014-02-25 (×7): qty 1

## 2014-02-25 MED ORDER — SODIUM CHLORIDE 0.9 % IJ SOLN
3.0000 mL | INTRAMUSCULAR | Status: DC | PRN
Start: 2014-02-25 — End: 2014-02-27

## 2014-02-25 MED ORDER — ACETAMINOPHEN 325 MG PO TABS
650.0000 mg | ORAL_TABLET | ORAL | Status: DC | PRN
  Administered 2014-02-25: 650 mg via ORAL
  Filled 2014-02-25: qty 2

## 2014-02-25 MED ORDER — SODIUM CHLORIDE 0.9 % IV BOLUS (SEPSIS)
250.0000 mL | Freq: Once | INTRAVENOUS | Status: AC
  Administered 2014-02-25: 250 mL via INTRAVENOUS

## 2014-02-25 NOTE — Progress Notes (Signed)
Assigned nurse Tiffany, RN notified of pt having leg cramp.  Will continue to monitor.  Karie Kirks, Therapist, sports.

## 2014-02-25 NOTE — Progress Notes (Signed)
    Subjective:  Breathing better after 3/4 L diuresis.   Objective:  Vital Signs in the last 24 hours: Temp:  [97.7 F (36.5 C)-99 F (37.2 C)] 98 F (36.7 C) (04/14 0950) Pulse Rate:  [58-123] 83 (04/14 0950) Resp:  [16-22] 18 (04/14 0950) BP: (96-136)/(36-96) 121/48 mmHg (04/14 0950) SpO2:  [95 %-100 %] 95 % (04/14 0950) Weight:  [151 lb 0.2 oz (68.5 kg)-157 lb 6.5 oz (71.4 kg)] 151 lb 0.2 oz (68.5 kg) (04/14 0500)  Intake/Output from previous day:  Intake/Output Summary (Last 24 hours) at 02/25/14 1100 Last data filed at 02/25/14 0824  Gross per 24 hour  Intake      0 ml  Output   3475 ml  Net  -3475 ml    Physical Exam: General appearance: alert, cooperative and no distress Lungs: clear to auscultation bilaterally Heart: irregularly irregular rhythm   Rate: 84  Rhythm: atrial fibrillation  Lab Results:  Recent Labs  02/24/14 1939 02/25/14 0230  WBC 4.8 5.1  HGB 11.9* 12.8  PLT 125* 135*    Recent Labs  02/24/14 1939 02/25/14 0230  NA 145 143  K 4.7 3.9  CL 110 104  CO2 22 24  GLUCOSE 103* 111*  BUN 26* 26*  CREATININE 1.18* 1.25*    Recent Labs  02/25/14 0230 02/25/14 0645  TROPONINI <0.30 <0.30    Recent Labs  02/25/14 0230  INR 1.44    Imaging: Imaging results have been reviewed  Cardiac Studies:  Assessment/Plan:   Principal Problem:   Acute on chronic diastolic CHF (congestive heart failure) Active Problems:   Atrial fibrillation   Hypertension   Bradycardia, sinus   Hypothyroidism   Coronary disease non obstucted    Chronic anticoagulation    PLAN: She is NPO for possible bedside DCCV today.   Kerin Ransom PA-C [facility] [phone] 02/25/2014, 11:00 AM   I have seen and examined the patient along with Kerin Ransom PA-C.  I have reviewed the chart, notes and new data.  I agree with PA's note.  Key new complaints: feels better, breathing almost back to baseline. Had a severe le cramp earlier, but it has resolved Key  examination changes: remains in AF, controlled rate; JVP around 7 cm elevation, no edema, clear lungs Key new findings / data: all cTnI levels are normal. TSH borderline - was normal just 1 month ago  PLAN: Cardioversion tomorrow - she could benefit from a little more diuresis, but will be ready by then.  Sanda Klein, MD, [place] [phone] 02/25/2014, 11:23 AM

## 2014-02-25 NOTE — Progress Notes (Signed)
Patient arrived to unit from ED via stretcher. Patient alert, oriented and ambulatory with minimal assistance.  Admission weight, vitals and assessment completed. Fall and safety plan reviewed with patient. Patient currently resting comfortably, call light within reach. Will continue to monitor. Blood pressure 108/63, pulse 84, temperature 97.7 F (36.5 C), temperature source Oral, resp. rate 18, height 4\' 8"  (1.422 m), weight 71.4 kg (157 lb 6.5 oz), SpO2 96.00%. [name]

## 2014-02-25 NOTE — Progress Notes (Signed)
Pt c/o a charlie horse in her right leg.  Pt says tylenol has not helped the cramps.  Notified PA and order given for valium, will carry out order and continue to monitor.

## 2014-02-25 NOTE — Progress Notes (Addendum)
Pts BP 78/42 manually and patient c/o dizziness, paged PA and MD.

## 2014-02-25 NOTE — Care Management Note (Signed)
    Page 1 of 1   02/25/2014     11:14:15 AM   CARE MANAGEMENT NOTE 02/25/2014  Patient:  [name], [name]   Account Number:  [id number]  Date Initiated:  02/25/2014  Documentation initiated by:  [facility]  Subjective/Objective Assessment:   80 yo with history of persistent atrial fibrillation and diastolic CHF presents to ER tonight with dyspnea and chest pain.//Home alone     Action/Plan:   IV diureses//Access for Home Health needs   Anticipated DC Date:  03/01/2014   Anticipated DC Plan:  [place]         Choice offered to / List presented to:             Status of service:  In process, will continue to follow Medicare Important Message given?   (If response is "NO", the following Medicare IM given date fields will be blank) Date Medicare IM given:   Date Additional Medicare IM given:    Discharge Disposition:    Per UR Regulation:    If discussed at Long Length of Stay Meetings, dates discussed:    Comments:

## 2014-02-25 NOTE — Progress Notes (Signed)
  Echocardiogram 2D Echocardiogram has been performed.  Valinda Hoar 02/25/2014, 3:44 PM

## 2014-02-25 NOTE — ED Notes (Signed)
Attempted report x1. 

## 2014-02-25 NOTE — ED Notes (Signed)
Barnett - Pt's daughter

## 2014-02-25 NOTE — Progress Notes (Signed)
Pt c/o cramping to Rt. Leg. 10/10 . Pain.  Angelica Ran PA notified  & ordered tylenol. Will continue to monitor.  Karie Kirks, Therapist, sports.

## 2014-02-26 ENCOUNTER — Encounter (HOSPITAL_COMMUNITY)
Admission: EM | Disposition: A | Discharge status: Home / Self Care | Payer: Self-pay | Source: Home / Self Care | Attending: Cardiology

## 2014-02-26 ENCOUNTER — Encounter (HOSPITAL_COMMUNITY): Payer: Self-pay | Admitting: Certified Registered"

## 2014-02-26 ENCOUNTER — Inpatient Hospital Stay (HOSPITAL_COMMUNITY): Payer: Medicare Other | Admitting: Certified Registered"

## 2014-02-26 ENCOUNTER — Encounter (HOSPITAL_COMMUNITY): Payer: Medicare Other | Admitting: Certified Registered"

## 2014-02-26 DIAGNOSIS — M549 Dorsalgia, unspecified: Secondary | ICD-10-CM

## 2014-02-26 HISTORY — PX: CARDIOVERSION: SHX1299

## 2014-02-26 SURGERY — CARDIOVERSION
Anesthesia: LOCAL

## 2014-02-26 SURGERY — CARDIOVERSION
Anesthesia: General

## 2014-02-26 MED ORDER — ACETAMINOPHEN 325 MG PO TABS
650.0000 mg | ORAL_TABLET | ORAL | Status: DC | PRN
Start: 2014-02-26 — End: 2014-02-27

## 2014-02-26 MED ORDER — SODIUM CHLORIDE 0.9 % IJ SOLN: 3.0000 mL | Freq: Two times a day (BID) | INTRAMUSCULAR | Status: DC

## 2014-02-26 MED ORDER — SODIUM CHLORIDE 0.9 % IV SOLN: 250.0000 mL | INTRAVENOUS | Status: DC | PRN

## 2014-02-26 MED ORDER — PHENYLEPHRINE HCL 10 MG/ML IJ SOLN
INTRAMUSCULAR | Status: DC | PRN
  Administered 2014-02-26 (×2): 40 ug via INTRAVENOUS

## 2014-02-26 MED ORDER — FUROSEMIDE 40 MG PO TABS
40.0000 mg | ORAL_TABLET | Freq: Every day | ORAL | Status: DC
  Administered 2014-02-27: 40 mg via ORAL
  Filled 2014-02-26 (×2): qty 1

## 2014-02-26 MED ORDER — NITROGLYCERIN 0.4 MG SL SUBL: 0.4000 mg | SUBLINGUAL_TABLET | SUBLINGUAL | Status: DC | PRN

## 2014-02-26 MED ORDER — PROPOFOL 10 MG/ML IV BOLUS
INTRAVENOUS | Status: DC | PRN
  Administered 2014-02-26: 80 mg via INTRAVENOUS

## 2014-02-26 MED ORDER — SODIUM CHLORIDE 0.9 % IJ SOLN: 3.0000 mL | INTRAMUSCULAR | Status: DC | PRN

## 2014-02-26 MED ORDER — AMIODARONE HCL 200 MG PO TABS: 400.0000 mg | ORAL_TABLET | Freq: Two times a day (BID) | ORAL | Status: DC

## 2014-02-26 MED ORDER — ASPIRIN 300 MG RE SUPP
300.0000 mg | RECTAL | Status: DC
  Filled 2014-02-26: qty 1

## 2014-02-26 MED ORDER — SODIUM CHLORIDE 0.9 % IV SOLN: INTRAVENOUS | Status: DC

## 2014-02-26 MED ORDER — ASPIRIN 81 MG PO CHEW: 324.0000 mg | CHEWABLE_TABLET | ORAL | Status: DC

## 2014-02-26 MED ORDER — ASPIRIN EC 81 MG PO TBEC
81.0000 mg | DELAYED_RELEASE_TABLET | Freq: Every day | ORAL | Status: DC
  Administered 2014-02-27: 81 mg via ORAL
  Filled 2014-02-26: qty 1

## 2014-02-26 MED ORDER — SODIUM CHLORIDE 0.9 % IV SOLN
INTRAVENOUS | Status: DC | PRN
  Administered 2014-02-26: 19:00:00 via INTRAVENOUS

## 2014-02-26 MED ORDER — ONDANSETRON HCL 4 MG/2ML IJ SOLN: 4.0000 mg | Freq: Four times a day (QID) | INTRAMUSCULAR | Status: DC | PRN

## 2014-02-26 NOTE — CV Procedure (Signed)
    CARDIOVERSION NOTE  Procedure: Electrical Cardioversion Indications:  Atrial Fibrillation  Procedure Details:  Consent: Risks of procedure as well as the alternatives and risks of each were explained to the (patient/caregiver).  Consent for procedure obtained.  Time Out: Verified patient identification, verified procedure, site/side was marked, verified correct patient position, special equipment/implants available, medications/allergies/relevent history reviewed, required imaging and test results available.  Performed  Patient placed on cardiac monitor, pulse oximetry, supplemental oxygen as necessary.  Sedation given: Propofol per anesthesia Pacer pads placed anterior and posterior chest.  Cardioverted 1 time(s).  Cardioverted at 120J biphasic.  Impression: Findings: Post procedure EKG shows: NSR Complications: None Patient did tolerate procedure well.  Plan: 1. Successful DCCV to NSR. 2. Continue current medical treatment. Anticipate d/c in the am tomorrow.  Time Spent Directly with the Patient:  30 minutes   Pixie Casino, MD, [facility] Attending Cardiologist [place] 02/26/2014, 7:05 PM

## 2014-02-26 NOTE — H&P (Signed)
    INTERVAL PROCEDURE H&P  History and Physical Interval Note:  02/26/2014 6:25 PM  [name] has presented today for their planned procedure. The various methods of treatment have been discussed with the patient and family. After consideration of risks, benefits and other options for treatment, the patient has consented to the procedure.  The patients' outpatient history has been reviewed, patient examined, and no change in status from most recent office note within the past 30 days. I have reviewed the patients' chart and labs and will proceed as planned. Questions were answered to the patient's satisfaction.   Pixie Casino, MD, [facility] Attending Cardiologist [place] 02/26/2014, 6:25 PM

## 2014-02-26 NOTE — Transfer of Care (Signed)
Immediate Anesthesia Transfer of Care Note  Patient: [name]  Procedure(s) Performed: Procedure(s): CARDIOVERSION AT BEDSIDE (N/A)  Patient Location: Nursing Unit  Anesthesia Type:General  Level of Consciousness: awake, alert  and oriented  Airway & Oxygen Therapy: Patient Spontanous Breathing and Patient connected to nasal cannula oxygen  Post-op Assessment: Report given to PACU RN, Post -op Vital signs reviewed and stable, Patient moving all extremities and Patient able to stick tongue midline  Post vital signs: Reviewed and stable  Complications: No apparent anesthesia complications

## 2014-02-26 NOTE — Anesthesia Preprocedure Evaluation (Signed)
Anesthesia Evaluation  Patient identified by MRN, date of birth, ID band Patient awake    Reviewed: Allergy & Precautions, H&P , NPO status , Patient's Chart, lab work & pertinent test results, reviewed documented beta blocker date and time   Airway Mallampati: II TM Distance: >3 FB Neck ROM: full    Dental   Pulmonary shortness of breath and at rest,  breath sounds clear to auscultation        Cardiovascular hypertension, On Medications and On Home Beta Blockers + angina + CAD, + Past MI, + Peripheral Vascular Disease and +CHF Atrial Fibrillation Rhythm:regular     Neuro/Psych negative neurological ROS  negative psych ROS   GI/Hepatic negative GI ROS, Neg liver ROS, GERD-  Medicated and Controlled,  Endo/Other  negative endocrine ROSHypothyroidism   Renal/GU negative Renal ROS  negative genitourinary   Musculoskeletal   Abdominal   Peds  Hematology  (+) anemia ,   Anesthesia Other Findings See surgeon's H&P   Reproductive/Obstetrics negative OB ROS                           Anesthesia Physical Anesthesia Plan  ASA: III and emergent  Anesthesia Plan: General   Post-op Pain Management:    Induction: Intravenous  Airway Management Planned: Mask  Additional Equipment:   Intra-op Plan:   Post-operative Plan:   Informed Consent: I have reviewed the patients History and Physical, chart, labs and discussed the procedure including the risks, benefits and alternatives for the proposed anesthesia with the patient or authorized representative who has indicated his/her understanding and acceptance.   Dental Advisory Given  Plan Discussed with: CRNA and Surgeon  Anesthesia Plan Comments:         Anesthesia Quick Evaluation

## 2014-02-26 NOTE — Plan of Care (Signed)
Problem: Phase I Progression Outcomes Goal: EF % per last Echo/documented,Core Reminder form on chart Outcome: Completed/Met Date Met:  02/26/14 EF = 60-65% per ECHO performed on 02/25/14.

## 2014-02-26 NOTE — Anesthesia Postprocedure Evaluation (Signed)
  Anesthesia Post-op Note  Patient: [name]  Procedure(s) Performed: Procedure(s): CARDIOVERSION AT BEDSIDE (N/A)  Patient Location: Nursing Unit  Anesthesia Type:General  Level of Consciousness: awake, alert  and oriented  Airway and Oxygen Therapy: Patient Spontanous Breathing and Patient connected to nasal cannula oxygen  Post-op Pain: none  Post-op Assessment: Post-op Vital signs reviewed, Patient's Cardiovascular Status Stable, Respiratory Function Stable, Patent Airway, No signs of Nausea or vomiting, Adequate PO intake, Pain level controlled, No headache and No backache  Post-op Vital Signs: Reviewed and stable  Last Vitals:  Filed Vitals:   02/26/14 1835  BP:   Pulse:   Temp: 36.8 C  Resp:     Complications: No apparent anesthesia complications

## 2014-02-26 NOTE — Progress Notes (Signed)
   Subjective:  Breathing better after diuresis.   Marland Kitchen apixaban  5 mg Oral BID  . carvedilol  6.25 mg Oral BID WC  . furosemide  40 mg Intravenous BID  . levothyroxine  75 mcg Oral QAC breakfast  . simvastatin  20 mg Oral QHS  . sodium chloride  3 mL Intravenous Q12H    Objective:  Vital Signs in the last 24 hours: Temp:  [97.5 F (36.4 C)-98 F (36.7 C)] 98 F (36.7 C) (04/15 0529) Pulse Rate:  [82-88] 82 (04/15 1114) Resp:  [18-20] 20 (04/15 1114) BP: (78-132)/(42-72) 93/60 mmHg (04/15 1114) SpO2:  [91 %-98 %] 91 % (04/15 1114) Weight:  [149 lb 9.6 oz (67.858 kg)] 149 lb 9.6 oz (67.858 kg) (04/15 0509)  Intake/Output from previous day:  Intake/Output Summary (Last 24 hours) at 02/26/14 1126 Last data filed at 02/26/14 1054  Gross per 24 hour  Intake    700 ml  Output   2225 ml  Net  -1525 ml   Physical Exam:  General appearance: alert, cooperative and no distress Lungs: clear to auscultation bilaterally Heart: irregularly irregular rhythm   Rate: 84  Rhythm: atrial fibrillation  Lab Results:  Recent Labs  02/24/14 1939 02/25/14 0230  WBC 4.8 5.1  HGB 11.9* 12.8  PLT 125* 135*    Recent Labs  02/24/14 1939 02/25/14 0230  NA 145 143  K 4.7 3.9  CL 110 104  CO2 22 24  GLUCOSE 103* 111*  BUN 26* 26*  CREATININE 1.18* 1.25*    Recent Labs  02/25/14 0645 02/25/14 1205  TROPONINI <0.30 <0.30    Recent Labs  02/25/14 0230  INR 1.44    Imaging: Imaging results have been reviewed  Cardiac Studies:  Assessment/Plan:   Principal Problem:   Acute on chronic diastolic CHF (congestive heart failure) Active Problems:   Bradycardia, sinus   Hypothyroidism   Atrial fibrillation   Coronary disease non obstucted    Hypertension   Chronic anticoagulation   She feels better, breathing almost back to baseline. Had a severe le cramp earlier, but it has resolved (with mustard). Remains in AF, controlled rate; JVP around 6 cm elevation, no  edema, clear lungs, negative 2.5 L overall, worsening CRea, we will switch IV lasix to PO.  Key new findings / data: all cTnI levels are normal. TSH borderline - was normal just 1 month ago  PLAN: Cardioversion today.    02/26/2014, 11:26 AM

## 2014-02-27 ENCOUNTER — Encounter (HOSPITAL_COMMUNITY): Payer: Self-pay | Admitting: Internal Medicine

## 2014-02-27 ENCOUNTER — Ambulatory Visit: Payer: Medicare Other | Admitting: Physician Assistant

## 2014-02-27 MED ORDER — ASPIRIN 81 MG PO TBEC: 81.0000 mg | DELAYED_RELEASE_TABLET | Freq: Every day | ORAL | Status: DC

## 2014-02-27 MED ORDER — TRAMADOL HCL 50 MG PO TABS: 50.0000 mg | ORAL_TABLET | Freq: Four times a day (QID) | ORAL | Status: DC

## 2014-02-27 MED ORDER — FUROSEMIDE 40 MG PO TABS: 20.0000 mg | ORAL_TABLET | Freq: Every day | ORAL | Status: DC

## 2014-02-27 MED ORDER — TRAMADOL HCL 50 MG PO TABS
50.0000 mg | ORAL_TABLET | Freq: Four times a day (QID) | ORAL | Status: DC
  Administered 2014-02-27: 50 mg via ORAL
  Filled 2014-02-27: qty 1

## 2014-02-27 NOTE — Progress Notes (Signed)
D/C Tele, D/C IV, D/C paperwork reviewed with pt, prescriptions given to pt., pt. Verbalized understanding of D/C instructions, D/C information reviewed with pt. Around 1400 and pt. Was waiting on her family to get to [place] from out of town, pt. Left unit via wheelchair by Charge nurse and transported home via her daughter and family, pt. Showed no signs or symptoms of distress.

## 2014-02-27 NOTE — Discharge Instructions (Signed)
Information on my medicine - ELIQUIS (apixaban)  This medication education was reviewed with me or my healthcare representative as part of my discharge preparation.  The pharmacist that spoke with me during my hospital stay was:  Pat Patrick, [facility]  Why was Eliquis prescribed for you? Eliquis was prescribed for you to reduce the risk of a blood clot forming that can cause a stroke if you have a medical condition called atrial fibrillation (a type of irregular heartbeat).  What do You need to know about Eliquis ? Take your Eliquis TWICE DAILY - one tablet in the morning and one tablet in the evening with or without food. If you have difficulty swallowing the tablet whole please discuss with your pharmacist how to take the medication safely.  Take Eliquis exactly as prescribed by your doctor and DO NOT stop taking Eliquis without talking to the doctor who prescribed the medication.  Stopping may increase your risk of developing a stroke.  Refill your prescription before you run out.  After discharge, you should have regular check-up appointments with your healthcare provider that is prescribing your Eliquis.  In the future your dose may need to be changed if your kidney function or weight changes by a significant amount or as you get older.  What do you do if you miss a dose? If you miss a dose, take it as soon as you remember on the same day and resume taking twice daily.  Do not take more than one dose of ELIQUIS at the same time to make up a missed dose.  Important Safety Information A possible side effect of Eliquis is bleeding. You should call your healthcare provider right away if you experience any of the following:   Bleeding from an injury or your nose that does not stop.   Unusual colored urine (red or dark brown) or unusual colored stools (red or black).   Unusual bruising for unknown reasons.   A serious fall or if you hit your head (even if there is no bleeding).  Some  medicines may interact with Eliquis and might increase your risk of bleeding or clotting while on Eliquis. To help avoid this, consult your healthcare provider or pharmacist prior to using any new prescription or non-prescription medications, including herbals, vitamins, non-steroidal anti-inflammatory drugs (NSAIDs) and supplements.  This website has more information on Eliquis (apixaban): [email or web address].no.
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
## 2014-02-27 NOTE — Progress Notes (Signed)
   Subjective:  Breathing better after diuresis. In SR, complains of left hip and shoulder pain as well as neck pain.  Marland Kitchen apixaban  5 mg Oral BID  . aspirin EC  81 mg Oral Daily  . carvedilol  6.25 mg Oral BID WC  . furosemide  40 mg Oral Daily  . levothyroxine  75 mcg Oral QAC breakfast  . simvastatin  20 mg Oral QHS  . sodium chloride  3 mL Intravenous Q12H   Objective:  Vital Signs in the last 24 hours: Temp:  [97.3 F (36.3 C)-98.2 F (36.8 C)] 97.8 F (36.6 C) (04/16 0544) Pulse Rate:  [56-90] 66 (04/16 0835) Resp:  [18-20] 18 (04/16 0544) BP: (93-144)/(36-88) 121/51 mmHg (04/16 0835) SpO2:  [91 %-99 %] 95 % (04/16 0544) Weight:  [148 lb 3.2 oz (67.223 kg)] 148 lb 3.2 oz (67.223 kg) (04/16 0544)  Intake/Output from previous day:  Intake/Output Summary (Last 24 hours) at 02/27/14 0952 Last data filed at 02/27/14 0133  Gross per 24 hour  Intake     75 ml  Output   1150 ml  Net  -1075 ml   Physical Exam:  General appearance: alert, cooperative and no distress Lungs: clear to auscultation bilaterally Heart: irregularly irregular rhythm   Rate: 84  Rhythm: atrial fibrillation  Lab Results:  Recent Labs  02/24/14 1939 02/25/14 0230  WBC 4.8 5.1  HGB 11.9* 12.8  PLT 125* 135*    Recent Labs  02/24/14 1939 02/25/14 0230  NA 145 143  K 4.7 3.9  CL 110 104  CO2 22 24  GLUCOSE 103* 111*  BUN 26* 26*  CREATININE 1.18* 1.25*    Recent Labs  02/25/14 0645 02/25/14 1205  TROPONINI <0.30 <0.30    Recent Labs  02/25/14 0230  INR 1.44    Imaging: Imaging results have been reviewed  Cardiac Studies:  Assessment/Plan:   Principal Problem:   Acute on chronic diastolic CHF (congestive heart failure) Active Problems:   Bradycardia, sinus   Hypothyroidism   Atrial fibrillation   Coronary disease non obstucted    Hypertension   Chronic anticoagulation   She feels better, breathing almost back to baseline. Remain in SR with good diuresis in  the last 24 hours.  Her pain appears musculoskeletal. We will give now and prescribe Tramadol. (avoid NSAIDS with Eliquis).  Follow up with Dr Harrington Challenger.   02/27/2014, 9:52 AM

## 2014-02-27 NOTE — Discharge Summary (Signed)
[name] 02/27/2014

## 2014-02-28 ENCOUNTER — Telehealth: Payer: Self-pay | Admitting: Internal Medicine

## 2014-02-28 ENCOUNTER — Telehealth: Payer: Self-pay | Admitting: Physician Assistant

## 2014-02-28 MED ORDER — TRAMADOL HCL 50 MG PO TABS: 50.0000 mg | ORAL_TABLET | Freq: Two times a day (BID) | ORAL | Status: DC | PRN

## 2014-02-28 NOTE — Telephone Encounter (Signed)
Called stating on discharged summary from last night her medications indicated for her to be on ASA and Eliquis.  She states she thought Dr. Harrington Challenger had told her to stop ASA at her last OV.  Looked thru the notes and could not find note for her to discontinue ASA.  States she is not having any bleeding.  Advised her to continue on the ASA until her OV on 4/29 w/Michele Lenze,PA.  States she will continue and let us know if has any bleeding.

## 2014-02-28 NOTE — Telephone Encounter (Signed)
New message    Patient has questions regarding discharge instruction from hospital - ASA & Eliquis.

## 2014-02-28 NOTE — Telephone Encounter (Signed)
Pt is requesting a call back from the nurse, states she was released from the hospital last night and she wants to pass on information to dr. Regis Bill regarding her states. Pt states she did not need a fu appointment.

## 2014-02-28 NOTE — Telephone Encounter (Addendum)
Pt called with question about her Tramadol. Was discharged yesterday and rx was sent in to [facility] for tramadol. Unfortunately it cannot be electronically prescribed. I offered to fax in copy of RX to her pharmacy. She prefers [facility] locally - I faxed in an RX for 30 tab with 0 RF consistent with original rx. I did change dose instructions to Tramadol 50mg  1 tab BID PRN given her age and renal function. She verbalized understanding and gratitude. Will also forward to nursing staff to please call [facility] to cancel rx in their system.  Dayna Dunn PA-C

## 2014-02-28 NOTE — Telephone Encounter (Signed)
Spoke to the pt.  She wanted you to know she was seen in the ED.  Informed her that the information is in the chart and that Dr. Regis Bill could review.

## 2014-02-28 NOTE — Telephone Encounter (Signed)
i am aware that her heart faiure got worse and she is to follow up with cardiology . Hope shes  Feeling better .

## 2014-03-01 ENCOUNTER — Emergency Department (HOSPITAL_COMMUNITY)
Admission: EM | Admit: 2014-03-01 | Discharge: 2014-03-01 | Disposition: A | Discharge status: Home / Self Care | Payer: Medicare Other | Attending: Emergency Medicine | Admitting: Emergency Medicine

## 2014-03-01 ENCOUNTER — Encounter (HOSPITAL_COMMUNITY): Payer: Self-pay

## 2014-03-01 ENCOUNTER — Emergency Department (HOSPITAL_COMMUNITY): Payer: Medicare Other

## 2014-03-01 DIAGNOSIS — Z8619 Personal history of other infectious and parasitic diseases: Secondary | ICD-10-CM | POA: Insufficient documentation

## 2014-03-01 DIAGNOSIS — Z7902 Long term (current) use of antithrombotics/antiplatelets: Secondary | ICD-10-CM | POA: Insufficient documentation

## 2014-03-01 DIAGNOSIS — M1990 Unspecified osteoarthritis, unspecified site: Secondary | ICD-10-CM | POA: Insufficient documentation

## 2014-03-01 DIAGNOSIS — Z79899 Other long term (current) drug therapy: Secondary | ICD-10-CM | POA: Insufficient documentation

## 2014-03-01 DIAGNOSIS — Z7982 Long term (current) use of aspirin: Secondary | ICD-10-CM | POA: Insufficient documentation

## 2014-03-01 DIAGNOSIS — E785 Hyperlipidemia, unspecified: Secondary | ICD-10-CM | POA: Insufficient documentation

## 2014-03-01 DIAGNOSIS — R51 Headache: Secondary | ICD-10-CM | POA: Insufficient documentation

## 2014-03-01 DIAGNOSIS — Z85828 Personal history of other malignant neoplasm of skin: Secondary | ICD-10-CM | POA: Insufficient documentation

## 2014-03-01 DIAGNOSIS — R110 Nausea: Secondary | ICD-10-CM | POA: Insufficient documentation

## 2014-03-01 DIAGNOSIS — I10 Essential (primary) hypertension: Secondary | ICD-10-CM | POA: Insufficient documentation

## 2014-03-01 DIAGNOSIS — R079 Chest pain, unspecified: Secondary | ICD-10-CM

## 2014-03-01 DIAGNOSIS — I5033 Acute on chronic diastolic (congestive) heart failure: Secondary | ICD-10-CM | POA: Insufficient documentation

## 2014-03-01 DIAGNOSIS — M25519 Pain in unspecified shoulder: Secondary | ICD-10-CM | POA: Insufficient documentation

## 2014-03-01 DIAGNOSIS — M542 Cervicalgia: Secondary | ICD-10-CM | POA: Insufficient documentation

## 2014-03-01 DIAGNOSIS — Z862 Personal history of diseases of the blood and blood-forming organs and certain disorders involving the immune mechanism: Secondary | ICD-10-CM | POA: Insufficient documentation

## 2014-03-01 DIAGNOSIS — I4891 Unspecified atrial fibrillation: Secondary | ICD-10-CM | POA: Insufficient documentation

## 2014-03-01 LAB — CBC
HCT: 40.2 % (ref 36.0–46.0)
Hemoglobin: 13.1 g/dL (ref 12.0–15.0)
MCH: 29.4 pg (ref 26.0–34.0)
MCHC: 32.6 g/dL (ref 30.0–36.0)
MCV: 90.3 fL (ref 78.0–100.0)
Platelets: 139 10*3/uL — ABNORMAL LOW (ref 150–400)
RBC: 4.45 MIL/uL (ref 3.87–5.11)
RDW: 13.4 % (ref 11.5–15.5)
WBC: 7.6 10*3/uL (ref 4.0–10.5)

## 2014-03-01 LAB — TROPONIN I
Troponin I: <0.3 ng/mL (ref <0.30)
Troponin I: <0.3 ng/mL (ref <0.30)

## 2014-03-01 LAB — BASIC METABOLIC PANEL
BUN: 28 mg/dL — ABNORMAL HIGH (ref 6–23)
CO2: 25 mEq/L (ref 19–32)
Calcium: 10.1 mg/dL (ref 8.4–10.5)
Chloride: 98 mEq/L (ref 96–112)
Creatinine, Ser: 0.98 mg/dL (ref 0.50–1.10)
GFR calc Af Amer: 59 mL/min — ABNORMAL LOW (ref >90)
GFR calc non Af Amer: 51 mL/min — ABNORMAL LOW (ref >90)
Glucose, Bld: 128 mg/dL — ABNORMAL HIGH (ref 70–99)
Potassium: 4 mEq/L (ref 3.7–5.3)
Sodium: 136 mEq/L — ABNORMAL LOW (ref 137–147)

## 2014-03-01 MED ORDER — HYDROCODONE-ACETAMINOPHEN 5-325 MG PO TABS: ORAL_TABLET | ORAL | Status: DC

## 2014-03-01 MED ORDER — HYDROCODONE-ACETAMINOPHEN 5-325 MG PO TABS
1.0000 | ORAL_TABLET | Freq: Once | ORAL | Status: AC
  Administered 2014-03-01: 1 via ORAL
  Filled 2014-03-01: qty 1

## 2014-03-01 MED ORDER — MORPHINE SULFATE 4 MG/ML IJ SOLN: 4.0000 mg | Freq: Once | INTRAMUSCULAR | Status: DC

## 2014-03-01 NOTE — ED Notes (Signed)
Phlebotomy at bedside.

## 2014-03-01 NOTE — ED Notes (Signed)
Pt c/o center chest pain that radiates to B/L shoulder, neck and upper back onset Thursday. Pt was recently here in hospital and sent home Thursday. Pt reports pain began that evening. Pt also reports that she had cardioversion done here.

## 2014-03-01 NOTE — ED Provider Notes (Signed)
Complains of right shoulder pain occipital headache and neck pain and chest pain onset 2 days ago gradual onset worse with moving or changing position. Treated with Tylenol this morning, without relief. No fever.. Patient had elective cardioversion 02/24/2014. On exam no distress lungs clear auscultation heart regular rate and rhythm abdomen nondistended nontender right upper extremity no redness no swelling or tenderness. Pain is exacerbated by active motion of shoulder. Radial pulse 2+. Chest is nontender. Head CT ordered as patient is on blood thinners with headache. Neurologic exam is nonfocal. I don't believe patient suffering from acute coronary syndrome in that she had normal cardiac catheterization March 2015  Orlie Dakin, MD 03/01/14 [phone]

## 2014-03-01 NOTE — Discharge Instructions (Signed)
Today you were evaluated for chest pain, neck, and shoulder pain.  It was determined no emergent process is taking place at this, pain is believed to be due to muscle cramps or spasms.    Do not take tramadol for pain anymore.  You may take norco every 4-6 hours as needed for pain.  This may cause drowsiness, do not drive while taking.  Be sure to follow up with Dr. Harrington Challenger, cardiology as scheduled. You should receive a call from the office early next week to reschedule your follow up appointment. Return to ER if NEW or worsening symptoms occur.  See below for further instructions.

## 2014-03-01 NOTE — ED Provider Notes (Signed)
Medical screening examination/treatment/procedure(s) were conducted as a shared visit with non-physician practitioner(s) and myself.  I personally evaluated the patient during the encounter.   EKG Interpretation None       Orlie Dakin, MD 03/01/14 [phone]

## 2014-03-01 NOTE — ED Provider Notes (Signed)
CSN: [id number]     Arrival date & time 03/01/14  0754 History   First MD Initiated Contact with Patient 03/01/14 0815     Chief Complaint  Patient presents with  . Chest Pain     (Consider location/radiation/quality/duration/timing/severity/associated sxs/prior Treatment) HPI Pt is an 80yo female with hx of CHF, HTN, palpitations, and afib presenting to ED c/o centralized chest pain that radiates to bilateral shoulders, neck and upper back.  Pain has been intermittent since Thursday, 4/16 evening.  Pt was admitted on 4/13 by Dr. Aundra Dubin for chest pain and afib. Pt was cardioverted on 4/15 back into [facility].  Pt states pain is intermittent, aching, 8/10, worse with movement.  She tried tramadol w/o relief. Denies SOB, diaphoresis, n/v/d. Denies recent falls or trauma.   Past Medical History  Diagnosis Date  . Dyslipidemia   . Hypertension   . Palpitations     PVCs/bigeminy on event in May 2009 revealing this was relatively asymptomatic  . Chest pain     a. 2011 Neg MV;  b. 01/2014 Cath: LM 20-30, LAD nl, D1 nl, LCX nl, OM1 nl, RCA dom 30-71m, PD/PL nl, EF 65%->Med Rx.  . Degenerative joint disease   . Hx of varicella   . History of skin cancer     tafeen/ non melanoma.   . Thrombocytopenia   . Anemia   . Monoclonal gammopathies   . Right lower quadrant abdominal pain 07/19/2012    Recurrent  With nausea    This is new since she had ct for llq pain in [facility]    R/o appendiceal problem  Hernia  Get ct scan  And plan  Fu     . Diastolic CHF, acute on chronic 01/31/2012  . Atrial fibrillation     a. Dx 01/2014->Eliquis started.   Past Surgical History  Procedure Laterality Date  . Cholecystectomy  1999  . Cesarean section      times 2  . Shoulder surgery  1996    Right   . Cataract extraction      Bilateral  implantt  . Cardioversion N/A 02/26/2014    Procedure: CARDIOVERSION AT BEDSIDE;  Surgeon: Pixie Casino, MD;  Location: [facility] OR;  Service: Cardiovascular;  Laterality: N/A;    Family History  Problem Relation Age of Onset  . Coronary artery disease Father   . Heart disease Father   . Lung cancer    . Alzheimer's disease Mother   . Cancer Brother 53    lung cancer   History  Substance Use Topics  . Smoking status: Never Smoker   . Smokeless tobacco: Never Used  . Alcohol Use: No   OB History   Grav Para Term Preterm Abortions TAB SAB Ect Mult Living                 Review of Systems  Constitutional: Negative for fever and chills.  Respiratory: Negative for cough, chest tightness and shortness of breath.   Cardiovascular: Positive for chest pain.  Gastrointestinal: Positive for nausea. Negative for vomiting, abdominal pain, diarrhea and constipation.  Musculoskeletal: Positive for back pain ( upper), myalgias and neck pain. Negative for neck stiffness.  All other systems reviewed and are negative.     Allergies  Review of patient's allergies indicates no known allergies.  Home Medications   Prior to Admission medications   Medication Sig Start Date End Date Taking? Authorizing Provider  apixaban (ELIQUIS) 5 MG TABS tablet Take 1 tablet (5 mg  total) by mouth 2 (two) times daily. 01/30/14   Rogelia Mire, NP  aspirin EC 81 MG EC tablet Take 1 tablet (81 mg total) by mouth daily. 02/27/14   Tarri Fuller, PA-C  CALCIUM PO Take 1 tablet by mouth daily.    Historical Provider, MD  carvedilol (COREG) 12.5 MG tablet Take 1 tablet (12.5 mg total) by mouth 2 (two) times daily with a meal. 02/20/14   Burnis Medin, MD  furosemide (LASIX) 40 MG tablet Take 0.5 tablets (20 mg total) by mouth daily. 02/27/14   Tarri Fuller, PA-C  levothyroxine (SYNTHROID, LEVOTHROID) 75 MCG tablet Take 1 tablet (75 mcg total) by mouth daily. 01/16/14   Fay Records, MD  nitroGLYCERIN (NITROSTAT) 0.4 MG SL tablet Place 1 tablet (0.4 mg total) under the tongue every 5 (five) minutes as needed. For chest pain 12/02/13   Fay Records, MD  simvastatin (ZOCOR) 20 MG tablet Take 1  tablet (20 mg total) by mouth at bedtime. 01/16/14   Fay Records, MD  traMADol (ULTRAM) 50 MG tablet Take 1 tablet (50 mg total) by mouth 2 (two) times daily as needed for moderate pain or severe pain. 02/28/14   Dayna N Dunn, PA-C   BP 119/38  Pulse 58  Temp(Src) 97.9 F (36.6 C) (Oral)  Resp 12  Ht 4\' 9"  (1.448 m)  Wt 147 lb (66.679 kg)  BMI 31.80 kg/m2  SpO2 96% Physical Exam  Nursing note and vitals reviewed. Constitutional: She appears well-developed and well-nourished. No distress.  HENT:  Head: Normocephalic and atraumatic.  Eyes: Conjunctivae are normal. No scleral icterus.  Neck: Normal range of motion. Neck supple.  FROM, pain with head rotation left and right. tenderness along cervical spinal muscles.   Cardiovascular: Normal rate, regular rhythm and normal heart sounds.   RRR  Pulmonary/Chest: Effort normal and breath sounds normal. No respiratory distress. She has no wheezes. She has no rales. She exhibits no tenderness.  Abdominal: Soft. Bowel sounds are normal. She exhibits no distension and no mass. There is no tenderness. There is no rebound and no guarding.  Musculoskeletal: Normal range of motion. She exhibits tenderness. She exhibits no edema.  Tenderness to upper trapezius.  Neurological: She is alert.  Skin: Skin is warm and dry. She is not diaphoretic.    ED Course  Procedures (including critical care time) Labs Review Labs Reviewed  CBC - Abnormal; Notable for the following:    Platelets 139 (*)    All other components within normal limits  BASIC METABOLIC PANEL - Abnormal; Notable for the following:    Sodium 136 (*)    Glucose, Bld 128 (*)    BUN 28 (*)    GFR calc non Af Amer 51 (*)    GFR calc Af Amer 59 (*)    All other components within normal limits  TROPONIN I  TROPONIN I    Imaging Review Ct Head Wo Contrast  03/01/2014   CLINICAL DATA:  Occipital headache, neck pain, chest pain, history of elective cardioversion (02/24/2014) on blood  thinners  EXAM: CT HEAD WITHOUT CONTRAST  TECHNIQUE: Contiguous axial images were obtained from the base of the skull through the vertex without intravenous contrast.  COMPARISON:  Head CT - 07/10/2013  FINDINGS: There is mild likely age-appropriate atrophy with sulcal prominence. Scattered periventricular hypodensities compatible with microvascular ischemic disease. The gray-white differentiation is otherwise well maintained without CT evidence of acute large territory infarct. No intraparenchymal or extra-axial mass  or hemorrhage. Unchanged size and configuration of the ventricles and basal cisterns. No midline shift. Limited visualization of the paranasal sinuses and mastoid air cells are normal. Regional soft tissues appear normal. Post bilateral cataract surgery.  IMPRESSION: Mild atrophy and microvascular ischemic disease without acute intracranial process.   Electronically Signed   By: Sandi Mariscal M.D.   On: 03/01/2014 09:54     EKG Interpretation  Date: 03/01/2014  Rate: 66  Rhythm: normal sinus rhythm  QRS Axis: normal  Intervals: PR prolonged  ST/T Wave abnormalities: normal  Conduction Disutrbances:first-degree A-V block   Narrative Interpretation: sinus rhythm with 1st degree AV block with possible premature atrial complex with aberrant conduction. Otherwise normal ECG.  Old EKG Reviewed: unchanged and unchanged except 1 PVC        MDM   Final diagnoses:  Chest pain  Shoulder pain  Neck pain    Pt is an 80yo female discharged from [facility] hospital on 4/16 for chest pain and afib. Pt had successful cardioversion on 4/15.  C/o chest, neck and upper back pain since being home.  No relief with tramadol. Pt appears well, no respiratory distress. Vitals: WNL.  Heart-regular rate and rhythm, lungs: CTAB. Pt is tender along neck and upper back musculature.    Review of medical records from 4/16, Dr. Meda Coffee, pt was c/o similar neck and shoulder pain believed to be musculoskeletal in nature.  Pt was prescribed tramadol. Advised to not sure NSAIDs as pt is on Eliquis.   EKG: unchanged Troponin: negative.  CBC: unremarkable BMP: mild hyponatremia-136, otherwise unremarkable.   Discussed pt with Dr. Winfred Leeds.  Will get head CT as pt c/o occipital headache and is on Eliquis.  Will give norco for pain.   Head CT: mild atrophy and microvascular ischemic disease w/o acute intracranial process.   Pt states pain has improved with norco, however pt and family unsure about pt being discharged home.  Dr. Winfred Leeds spoke with family, will consult cardiology to see if f/u appointment can be made sooner as f/u is currently scheduled for 4/29.    Consulted with Dr. Domenic Polite, cardiology, who stated Dr. Harrington Challenger, cardiology office will be notified to call pt to reschedule appointment.    Pt discharged home, advised to d/c tramadol. May take norco as needed for pain. Home care instructions for musculoskeletal pain provided. Return precautions provided. Pt and family verbalized understanding and agreement with tx plan.     Noland Fordyce, PA-C 03/01/14 1211

## 2014-03-03 NOTE — Telephone Encounter (Signed)
Will cancel. 

## 2014-03-04 ENCOUNTER — Telehealth: Payer: Self-pay | Admitting: *Deleted

## 2014-03-04 NOTE — Telephone Encounter (Signed)
I s/w Heather at [facility] to make sure the tramadol was cancelled with the mail order and it never actually went through to mail order but she did see it was sent in to the local pharm. I said great and thank you.

## 2014-03-05 ENCOUNTER — Telehealth: Payer: Self-pay | Admitting: Internal Medicine

## 2014-03-05 NOTE — Telephone Encounter (Signed)
Spoke with pt, she is concerned because at one time she was told to stop her asa due to the eliquis. Now, from the hospital, it is back on her medicine list. Pt instructed to stop the asa. She is also c/o dizziness most of the time. It is there sitting or standing and when lying down.  her bp has been 131/60 to 128/40 with pulse 70's to high 50's. Explained to pt did not feel this was related to her bp. She is going to try Claritin to see if that will help with any vertigo. If no change she will let us know. She has a follow up appt with dr Harrington Challenger next week.

## 2014-03-05 NOTE — Telephone Encounter (Signed)
New Message  Pt called states that she is taking eliquis// believes that it may have a high risk of addition bleeding if taken with Asprin. Pt states that baby asprin is on the list of meds should be taking. Pt requesting a call back to discuss if she should continue to take Eliquis given the high risk symptoms.  She states that she is extremly dizzy and unable to drive// requests a call back to discuss.

## 2014-03-10 ENCOUNTER — Telehealth: Payer: Self-pay | Admitting: Internal Medicine

## 2014-03-10 ENCOUNTER — Other Ambulatory Visit (HOSPITAL_COMMUNITY): Payer: Self-pay | Admitting: Cardiology

## 2014-03-10 DIAGNOSIS — I6529 Occlusion and stenosis of unspecified carotid artery: Secondary | ICD-10-CM

## 2014-03-10 NOTE — Telephone Encounter (Signed)
Pt was in hospital last week and need hosp fup what day can I schedule her for 30 minutes

## 2014-03-10 NOTE — Telephone Encounter (Signed)
Patient should schedule an appt with cardiology and then Dr. Regis Bill will see her for post hospital follow up.

## 2014-03-12 ENCOUNTER — Encounter: Payer: Self-pay | Admitting: Physician Assistant

## 2014-03-12 ENCOUNTER — Ambulatory Visit (INDEPENDENT_AMBULATORY_CARE_PROVIDER_SITE_OTHER): Payer: Medicare Other | Admitting: Physician Assistant

## 2014-03-12 VITALS — BP 110/60 | HR 62 | Ht <= 58 in | Wt 153.0 lb

## 2014-03-12 DIAGNOSIS — I10 Essential (primary) hypertension: Secondary | ICD-10-CM

## 2014-03-12 DIAGNOSIS — I5033 Acute on chronic diastolic (congestive) heart failure: Secondary | ICD-10-CM

## 2014-03-12 DIAGNOSIS — I4891 Unspecified atrial fibrillation: Secondary | ICD-10-CM

## 2014-03-12 DIAGNOSIS — I509 Heart failure, unspecified: Secondary | ICD-10-CM

## 2014-03-12 NOTE — Assessment & Plan Note (Signed)
Patient just underwent DC cardioversion on 02/26/14. She was in normal sinus rhythm on 03/01/14 in the emergency room. Today she is back in atrial fibrillation and complaining of dyspnea on exertion. She is on Eliquis and baby aspirin. Her rate is controlled. I will discuss with Dr. Harrington Challenger to see if she wants to try an antiarrhythmic.

## 2014-03-12 NOTE — Progress Notes (Signed)
HPI:  This is an 80 year old female patient Dr. Harrington Challenger who has had several hospitalizations and emergency room visits over the past month. She was admitted to the hospital with chest pain on 3/15 and underwent cardiac catheterization that showed mild nonobstructive disease and normal EF. She also had atrial fibrillation and diastolic heart failure. She was sent home on Eliquis and her rate was controlled. She continued to have dyspnea on exertion and heart failure and was readmitted. She underwent DC cardioversion on 02/26/14. 2-D echo revealed normal LV function and wall motion. She returned to the emergency room on 03/01/14 with chest pain that was felt to be musculoskeletal. She was sent home with a prescription for tramadol.  She also has a history of hypertension, hyperlipidemia, and aortic sclerosis.  Patient has been feeling poorly since her last emergency room visit. She still complains of dyspnea on exertion with little activity as well as some dizziness if she bends over. She denies any further chest pain, palpitations, orthopnea, or edema.   No Known Allergies  Current Outpatient Prescriptions on File Prior to Visit: apixaban (ELIQUIS) 5 MG TABS tablet, Take 1 tablet (5 mg total) by mouth 2 (two) times daily., Disp: 60 tablet, Rfl: 6 aspirin EC 81 MG EC tablet, Take 1 tablet (81 mg total) by mouth daily., Disp: , Rfl:  CALCIUM PO, Take 1 tablet by mouth daily., Disp: , Rfl:  carvedilol (COREG) 12.5 MG tablet, Take 1 tablet (12.5 mg total) by mouth 2 (two) times daily with a meal., Disp: 60 tablet, Rfl: 3 furosemide (LASIX) 40 MG tablet, Take 0.5 tablets (20 mg total) by mouth daily., Disp: 30 tablet, Rfl: 5 HYDROcodone-acetaminophen (NORCO/VICODIN) 5-325 MG per tablet, Take 1 tablet every 4-6 hours as needed for pain., Disp: 10 tablet, Rfl: 0 levothyroxine (SYNTHROID, LEVOTHROID) 75 MCG tablet, Take 1 tablet (75 mcg total) by mouth daily., Disp: 90 tablet, Rfl: 2 nitroGLYCERIN (NITROSTAT)  0.4 MG SL tablet, Place 1 tablet (0.4 mg total) under the tongue every 5 (five) minutes as needed. For chest pain, Disp: 25 tablet, Rfl: 6 simvastatin (ZOCOR) 20 MG tablet, Take 1 tablet (20 mg total) by mouth at bedtime., Disp: 90 tablet, Rfl: 2  No current facility-administered medications on file prior to visit.   Past Medical History:   Dyslipidemia                                                 Hypertension                                                 Palpitations                                                   Comment:PVCs/bigeminy on event in May 2009 revealing               this was relatively asymptomatic   Chest pain  Comment:a. 2011 Neg MV;  b. 01/2014 Cath: LM 20-30, LAD               nl, D1 nl, LCX nl, OM1 nl, RCA dom 30-16m,               PD/PL nl, EF 65%->Med Rx.   Degenerative joint disease                                   Hx of varicella                                              History of skin cancer                                         Comment:tafeen/ non melanoma.    Thrombocytopenia                                             Anemia                                                       Monoclonal gammopathies                                      Right lower quadrant abdominal pain             07/19/2012       Comment:Recurrent  With nausea    This is new since she              had ct for llq pain in [facility]    R/o appendiceal              problem  Hernia  Get ct scan  And plan  Fu      Diastolic CHF, acute on chronic                 01/31/2012    Atrial fibrillation                                            Comment:a. Dx 01/2014->Eliquis started.  Past Surgical History:   CHOLECYSTECTOMY                                  1999         CESAREAN SECTION                                                Comment:times 2   SHOULDER SURGERY  1996           Comment:Right    CATARACT  EXTRACTION                                             Comment:Bilateral  implantt   CARDIOVERSION                                   N/A 02/26/2014      Comment:Procedure: CARDIOVERSION AT BEDSIDE;  Surgeon:               Pixie Casino, MD;  Location: [facility] OR;                Service: Cardiovascular;  Laterality: N/A;  Review of patient's family history indicates:   Coronary artery disease        Father                   Heart disease                  Father                   Lung cancer                                             Alzheimer's disease            Mother                   Cancer                         Brother                    Comment: lung cancer   Social History   Marital Status: Widowed             Spouse Name:                      Years of Education:                 Number of children: 3           Occupational History Occupation          Regulatory affairs officer, book                                         keeping  Social History Main Topics   Smoking Status: Never Smoker                     Smokeless Status: Never Used  Alcohol Use: No             Drug Use: No             Sexual Activity: Not Currently      Other Topics            Concern   None on file  Social History Narrative   Occupation: formerly [employer], 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 Eats with her.    ROS: See history of present illness otherwise negative   PHYSICAL EXAM: Well-nournished, in no acute distress. Neck: No JVD, HJR, Bruit, or thyroid enlargement  Lungs: Decreased breath sounds but No tachypnea, clear without wheezing, rales, or rhonchi  Cardiovascular: Irregular irregular, PMI not displaced, heart sounds distant, 1/6  systolic murmur at the left sternal border, no gallops, bruit, thrill, or heave.  Abdomen: BS normal. Soft without organomegaly, masses, lesions or tenderness.  Extremities: without cyanosis, clubbing or edema. Good distal pulses bilateral  SKin: Warm, no lesions or rashes   Musculoskeletal: No deformities  Neuro: no focal signs  There were no vitals taken for this visit.   EKG: Atrial fibrillation at 75 beats per minute with nonspecific ST-T wave changes  Left mainstem: The left main is patent without obstructive disease. There is diffuse irregularity noted. The mid left main has 20-30% stenosis and there is moderate calcification noted.  Left anterior descending (LAD): The LAD is patent to the left ventricular apex. There is mild diffuse plaquing without significant stenosis. The first diagonal is patent.  Left circumflex (LCx): Left circumflex is patent. The first OM is patent without significant stenosis.  Right coronary artery (RCA): This is a large, dominant vessel. The vessel is moderately calcified. The mid vessel has 30-40% stenosis. The PDA and PLA branches are patent.  Left ventriculography: Left ventricular systolic function is vigorous, LVEF is estimated at 65%, there is no significant mitral regurgitation   Final Conclusions:   1. Diffuse nonobstructive coronary artery disease 2. Normal left ventricular systolic function  Recommendations: Medical therapy for nonobstructive CAD.  Sherren Mocha 01/29/2014, 4:11 PM   Echocardiogram 02/25/14 Study Conclusions  - Left ventricle: The cavity size was normal. Wall thickness was increased in a pattern of mild LVH. There was focal basal hypertrophy. Systolic function was normal. The estimated ejection fraction was in the range of 60% to 65%. Wall motion was normal; there were no regional wall motion abnormalities. - Left atrium: The atrium was mildly dilated. - Right atrium: The atrium was mildly dilated. -  Pericardium, extracardiac: A trivial pericardial effusion was identified.

## 2014-03-12 NOTE — Assessment & Plan Note (Signed)
Stable

## 2014-03-12 NOTE — Patient Instructions (Signed)
Your physician recommends that you schedule a follow-up appointment in: WITH DR. ROSS IN 2-3 WEEKS  Your physician recommends that you continue on your current medications as directed. Please refer to the Current Medication list given to you today.

## 2014-03-12 NOTE — Assessment & Plan Note (Signed)
No evidence of heart failure on exam today. 

## 2014-03-14 ENCOUNTER — Telehealth: Payer: Self-pay | Admitting: *Deleted

## 2014-03-14 MED ORDER — AMIODARONE HCL 200 MG PO TABS: 200.0000 mg | ORAL_TABLET | Freq: Two times a day (BID) | ORAL | Status: DC

## 2014-03-14 NOTE — Telephone Encounter (Signed)
Would recomm 200 bid of amiodarone.  Plan f/u in clinic for a couple of wks. Keep on eliquis ----- Message ----- From: Imogene Burn, PA-C to Fay Records, MD  Dr. Harrington Challenger, patient is back in atrial fib and feeling poorly. Can you review my note? Would she benefit from Amiodarone?  Trying to get her back into see you soon. Patient and daughter quite anxious.   Spoke with pt, Aware of dr Harrington Challenger recommendation  Script sent to the pharm

## 2014-03-17 ENCOUNTER — Ambulatory Visit (HOSPITAL_COMMUNITY): Payer: Medicare Other | Attending: Cardiovascular Disease | Admitting: Cardiology

## 2014-03-17 ENCOUNTER — Telehealth: Payer: Self-pay | Admitting: Internal Medicine

## 2014-03-17 DIAGNOSIS — I499 Cardiac arrhythmia, unspecified: Secondary | ICD-10-CM | POA: Insufficient documentation

## 2014-03-17 DIAGNOSIS — I10 Essential (primary) hypertension: Secondary | ICD-10-CM | POA: Insufficient documentation

## 2014-03-17 DIAGNOSIS — I658 Occlusion and stenosis of other precerebral arteries: Secondary | ICD-10-CM | POA: Insufficient documentation

## 2014-03-17 DIAGNOSIS — I739 Peripheral vascular disease, unspecified: Secondary | ICD-10-CM | POA: Insufficient documentation

## 2014-03-17 DIAGNOSIS — R42 Dizziness and giddiness: Secondary | ICD-10-CM | POA: Insufficient documentation

## 2014-03-17 DIAGNOSIS — I6529 Occlusion and stenosis of unspecified carotid artery: Secondary | ICD-10-CM

## 2014-03-17 DIAGNOSIS — E785 Hyperlipidemia, unspecified: Secondary | ICD-10-CM | POA: Insufficient documentation

## 2014-03-17 NOTE — Progress Notes (Signed)
Carotid duplex performed 

## 2014-03-17 NOTE — Telephone Encounter (Signed)
Walk in pt form " Medication Question" Dropped Off paper gave to [place]

## 2014-04-10 NOTE — Progress Notes (Signed)
HPI:  This is an 80 year old female patient  who was admitted to the hospital with chest pain on 3/15 and underwent cardiac catheterization that showed mild nonobstructive disease and normal EF. She also had atrial fibrillation and diastolic heart failure. She was sent home on Eliquis and her rate was controlled. She continued to have dyspnea on exertion and heart failure and was readmitted. She underwent DC cardioversion on 02/26/14. 2-D echo revealed normal LV function and wall motion. She returned to the emergency room on 03/01/14 with chest pain that was felt to be musculoskeletal. She was sent home with a prescription for tramadol.  She also has a history of hypertension, hyperlipidemia, and aortic sclerosis. She was seen by [name] at the end of  April  More SOB Found to be in afib   Started on amiodarone  Sinc then she has done some better  No dizziness.  No palpitations  Breathing is OK  HPI  No Known Allergies  Current Outpatient Prescriptions  Medication Sig Dispense Refill  . amiodarone (PACERONE) 200 MG tablet Take 1 tablet (200 mg total) by mouth 2 (two) times daily.  60 tablet  12  . apixaban (ELIQUIS) 5 MG TABS tablet Take 1 tablet (5 mg total) by mouth 2 (two) times daily.  60 tablet  6  . aspirin EC 81 MG EC tablet Take 1 tablet (81 mg total) by mouth daily.      Marland Kitchen CALCIUM PO Take 1 tablet by mouth daily.      . carvedilol (COREG) 12.5 MG tablet Take 1 tablet (12.5 mg total) by mouth 2 (two) times daily with a meal.  60 tablet  3  . furosemide (LASIX) 40 MG tablet Take 0.5 tablets (20 mg total) by mouth daily.  30 tablet  5  . HYDROcodone-acetaminophen (NORCO/VICODIN) 5-325 MG per tablet Take 1 tablet every 4-6 hours as needed for pain.  10 tablet  0  . levothyroxine (SYNTHROID, LEVOTHROID) 75 MCG tablet Take 1 tablet (75 mcg total) by mouth daily.  90 tablet  2  . nitroGLYCERIN (NITROSTAT) 0.4 MG SL tablet Place 1 tablet (0.4 mg total) under the tongue every 5 (five) minutes as  needed. For chest pain  25 tablet  6  . simvastatin (ZOCOR) 20 MG tablet Take 1 tablet (20 mg total) by mouth at bedtime.  90 tablet  2  . traMADol (ULTRAM) 50 MG tablet        No current facility-administered medications for this visit.    Past Medical History  Diagnosis Date  . Dyslipidemia   . Hypertension   . Palpitations     PVCs/bigeminy on event in May 2009 revealing this was relatively asymptomatic  . Chest pain     a. 2011 Neg MV;  b. 01/2014 Cath: LM 20-30, LAD nl, D1 nl, LCX nl, OM1 nl, RCA dom 30-77m, PD/PL nl, EF 65%->Med Rx.  . Degenerative joint disease   . Hx of varicella   . History of skin cancer     tafeen/ non melanoma.   . Thrombocytopenia   . Anemia   . Monoclonal gammopathies   . Right lower quadrant abdominal pain 07/19/2012    Recurrent  With nausea    This is new since she had ct for llq pain in [facility]    R/o appendiceal problem  Hernia  Get ct scan  And plan  Fu     . Diastolic CHF, acute on chronic 01/31/2012  . Atrial fibrillation  a. Dx 01/2014->Eliquis started.    Past Surgical History  Procedure Laterality Date  . Cholecystectomy  1999  . Cesarean section      times 2  . Shoulder surgery  1996    Right   . Cataract extraction      Bilateral  implantt  . Cardioversion N/A 02/26/2014    Procedure: CARDIOVERSION AT BEDSIDE;  Surgeon: Pixie Casino, MD;  Location: [facility] OR;  Service: Cardiovascular;  Laterality: N/A;    Family History  Problem Relation Age of Onset  . Coronary artery disease Father   . Heart disease Father   . Lung cancer    . Alzheimer's disease Mother   . Cancer Brother 57    lung cancer    History   Social History  . Marital Status: Widowed    Spouse Name: N/A    Number of Children: 3  . Years of Education: N/A   Occupational History  .      [employer], book keeping   Social History Main Topics  . Smoking status: Never Smoker   . Smokeless tobacco: Never Used  . Alcohol Use: No  . Drug Use: No  .  Sexual Activity: Not Currently   Other Topics Concern  . 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 Eats with her.    Review of Systems:  All systems reviewed.  They are negative to the above problem except as previously stated.  Vital Signs: BP 116/50  Pulse 75  Ht 4\' 8"  (1.422 m)  Wt 156 lb (70.761 kg)  BMI 34.99 kg/m2  Physical Exam  HEENT:  Normocephalic, atraumatic. EOMI, PERRLA.  Neck: JVP is normal.  No bruits.  Lungs: clear to auscultation. No rales no wheezes.  Heart: Irregular rate and rhythm. Normal S1, S2. No S3.   No significant murmurs. PMI not displaced.  Abdomen:  Supple, nontender. Normal bowel sounds. No masses. No hepatomegaly.  Extremities:   Good distal pulses throughout. No lower extremity edema.  Musculoskeletal :moving all extremities.  Neuro:   alert and oriented x3.  CN II-XII grossly intact.  EKG  Atrial fib 79 bpm    Assessment and Plan:  1.  Atrial fibrillaton.  Patient appears to be tolerating better since amio started/  Ques due to slowing of rate  I would continue current loading  F/U later this sommer  2.  CP  Denies  Mild CAD at cath.

## 2014-04-11 ENCOUNTER — Encounter: Payer: Self-pay | Admitting: Internal Medicine

## 2014-04-11 ENCOUNTER — Ambulatory Visit (INDEPENDENT_AMBULATORY_CARE_PROVIDER_SITE_OTHER): Payer: Medicare Other | Admitting: Internal Medicine

## 2014-04-11 VITALS — BP 116/50 | HR 75 | Ht <= 58 in | Wt 156.0 lb

## 2014-04-11 DIAGNOSIS — I4891 Unspecified atrial fibrillation: Secondary | ICD-10-CM

## 2014-04-11 NOTE — Patient Instructions (Signed)
Your physician recommends that you continue on your current medications as directed. Please refer to the Current Medication list given to you today.  Your physician recommends that you schedule a follow-up appointment in: July 2015.

## 2014-04-13 ENCOUNTER — Observation Stay (HOSPITAL_COMMUNITY)
Admission: EM | Admit: 2014-04-13 | Discharge: 2014-04-14 | Disposition: A | Discharge status: Home / Self Care | Payer: Medicare Other | Attending: Family Medicine | Admitting: Family Medicine

## 2014-04-13 ENCOUNTER — Encounter (HOSPITAL_COMMUNITY): Payer: Self-pay | Admitting: Emergency Medicine

## 2014-04-13 ENCOUNTER — Emergency Department (HOSPITAL_COMMUNITY): Payer: Medicare Other

## 2014-04-13 DIAGNOSIS — W19XXXA Unspecified fall, initial encounter: Secondary | ICD-10-CM

## 2014-04-13 DIAGNOSIS — D472 Monoclonal gammopathy: Secondary | ICD-10-CM | POA: Diagnosis not present

## 2014-04-13 DIAGNOSIS — R001 Bradycardia, unspecified: Secondary | ICD-10-CM

## 2014-04-13 DIAGNOSIS — J811 Chronic pulmonary edema: Secondary | ICD-10-CM | POA: Insufficient documentation

## 2014-04-13 DIAGNOSIS — M549 Dorsalgia, unspecified: Secondary | ICD-10-CM

## 2014-04-13 DIAGNOSIS — E039 Hypothyroidism, unspecified: Secondary | ICD-10-CM | POA: Insufficient documentation

## 2014-04-13 DIAGNOSIS — N289 Disorder of kidney and ureter, unspecified: Secondary | ICD-10-CM | POA: Diagnosis not present

## 2014-04-13 DIAGNOSIS — R0602 Shortness of breath: Secondary | ICD-10-CM | POA: Diagnosis not present

## 2014-04-13 DIAGNOSIS — I4949 Other premature depolarization: Secondary | ICD-10-CM | POA: Diagnosis not present

## 2014-04-13 DIAGNOSIS — I214 Non-ST elevation (NSTEMI) myocardial infarction: Secondary | ICD-10-CM

## 2014-04-13 DIAGNOSIS — T148XXA Other injury of unspecified body region, initial encounter: Secondary | ICD-10-CM

## 2014-04-13 DIAGNOSIS — M8580 Other specified disorders of bone density and structure, unspecified site: Secondary | ICD-10-CM

## 2014-04-13 DIAGNOSIS — Z79899 Other long term (current) drug therapy: Secondary | ICD-10-CM | POA: Insufficient documentation

## 2014-04-13 DIAGNOSIS — I493 Ventricular premature depolarization: Secondary | ICD-10-CM | POA: Diagnosis present

## 2014-04-13 DIAGNOSIS — M5136 Other intervertebral disc degeneration, lumbar region: Secondary | ICD-10-CM

## 2014-04-13 DIAGNOSIS — R0789 Other chest pain: Secondary | ICD-10-CM

## 2014-04-13 DIAGNOSIS — D696 Thrombocytopenia, unspecified: Secondary | ICD-10-CM

## 2014-04-13 DIAGNOSIS — I200 Unstable angina: Secondary | ICD-10-CM

## 2014-04-13 DIAGNOSIS — I779 Disorder of arteries and arterioles, unspecified: Secondary | ICD-10-CM | POA: Diagnosis not present

## 2014-04-13 DIAGNOSIS — M5135 Other intervertebral disc degeneration, thoracolumbar region: Secondary | ICD-10-CM

## 2014-04-13 DIAGNOSIS — N63 Unspecified lump in unspecified breast: Secondary | ICD-10-CM

## 2014-04-13 DIAGNOSIS — R0609 Other forms of dyspnea: Secondary | ICD-10-CM

## 2014-04-13 DIAGNOSIS — I509 Heart failure, unspecified: Secondary | ICD-10-CM

## 2014-04-13 DIAGNOSIS — Z9089 Acquired absence of other organs: Secondary | ICD-10-CM | POA: Insufficient documentation

## 2014-04-13 DIAGNOSIS — N179 Acute kidney failure, unspecified: Secondary | ICD-10-CM

## 2014-04-13 DIAGNOSIS — E785 Hyperlipidemia, unspecified: Secondary | ICD-10-CM | POA: Diagnosis not present

## 2014-04-13 DIAGNOSIS — R209 Unspecified disturbances of skin sensation: Secondary | ICD-10-CM

## 2014-04-13 DIAGNOSIS — M5126 Other intervertebral disc displacement, lumbar region: Secondary | ICD-10-CM

## 2014-04-13 DIAGNOSIS — I10 Essential (primary) hypertension: Secondary | ICD-10-CM | POA: Diagnosis not present

## 2014-04-13 DIAGNOSIS — I5033 Acute on chronic diastolic (congestive) heart failure: Secondary | ICD-10-CM | POA: Diagnosis present

## 2014-04-13 DIAGNOSIS — Z8249 Family history of ischemic heart disease and other diseases of the circulatory system: Secondary | ICD-10-CM | POA: Insufficient documentation

## 2014-04-13 DIAGNOSIS — I4891 Unspecified atrial fibrillation: Secondary | ICD-10-CM | POA: Diagnosis not present

## 2014-04-13 DIAGNOSIS — R0600 Dyspnea, unspecified: Secondary | ICD-10-CM

## 2014-04-13 DIAGNOSIS — R300 Dysuria: Secondary | ICD-10-CM

## 2014-04-13 DIAGNOSIS — R079 Chest pain, unspecified: Secondary | ICD-10-CM

## 2014-04-13 DIAGNOSIS — D649 Anemia, unspecified: Secondary | ICD-10-CM

## 2014-04-13 DIAGNOSIS — IMO0001 Reserved for inherently not codable concepts without codable children: Secondary | ICD-10-CM

## 2014-04-13 DIAGNOSIS — Z85828 Personal history of other malignant neoplasm of skin: Secondary | ICD-10-CM | POA: Insufficient documentation

## 2014-04-13 DIAGNOSIS — K219 Gastro-esophageal reflux disease without esophagitis: Secondary | ICD-10-CM

## 2014-04-13 DIAGNOSIS — E8809 Other disorders of plasma-protein metabolism, not elsewhere classified: Secondary | ICD-10-CM

## 2014-04-13 DIAGNOSIS — I5032 Chronic diastolic (congestive) heart failure: Secondary | ICD-10-CM | POA: Diagnosis present

## 2014-04-13 DIAGNOSIS — S0990XA Unspecified injury of head, initial encounter: Secondary | ICD-10-CM

## 2014-04-13 DIAGNOSIS — Z7901 Long term (current) use of anticoagulants: Secondary | ICD-10-CM

## 2014-04-13 DIAGNOSIS — R0989 Other specified symptoms and signs involving the circulatory and respiratory systems: Secondary | ICD-10-CM

## 2014-04-13 DIAGNOSIS — I2510 Atherosclerotic heart disease of native coronary artery without angina pectoris: Secondary | ICD-10-CM | POA: Insufficient documentation

## 2014-04-13 DIAGNOSIS — I6529 Occlusion and stenosis of unspecified carotid artery: Secondary | ICD-10-CM | POA: Diagnosis present

## 2014-04-13 DIAGNOSIS — IMO0002 Reserved for concepts with insufficient information to code with codable children: Secondary | ICD-10-CM

## 2014-04-13 DIAGNOSIS — R739 Hyperglycemia, unspecified: Secondary | ICD-10-CM

## 2014-04-13 DIAGNOSIS — M79604 Pain in right leg: Secondary | ICD-10-CM

## 2014-04-13 DIAGNOSIS — I999 Unspecified disorder of circulatory system: Secondary | ICD-10-CM

## 2014-04-13 LAB — URINALYSIS, ROUTINE W REFLEX MICROSCOPIC
Bilirubin Urine: NEGATIVE
Glucose, UA: NEGATIVE mg/dL
Hgb urine dipstick: NEGATIVE
Ketones, ur: NEGATIVE mg/dL
LEUKOCYTES UA: NEGATIVE
NITRITE: NEGATIVE
PH: 5 (ref 5.0–8.0)
Protein, ur: NEGATIVE mg/dL
SPECIFIC GRAVITY, URINE: 1.007 (ref 1.005–1.030)
UROBILINOGEN UA: 0.2 mg/dL (ref 0.0–1.0)

## 2014-04-13 LAB — CBC
HEMATOCRIT: 39.8 % (ref 36.0–46.0)
HEMOGLOBIN: 12.6 g/dL (ref 12.0–15.0)
MCH: 28 pg (ref 26.0–34.0)
MCHC: 31.7 g/dL (ref 30.0–36.0)
MCV: 88.4 fL (ref 78.0–100.0)
Platelets: 144 10*3/uL — ABNORMAL LOW (ref 150–400)
RBC: 4.5 MIL/uL (ref 3.87–5.11)
RDW: 14.6 % (ref 11.5–15.5)
WBC: 6.4 10*3/uL (ref 4.0–10.5)

## 2014-04-13 LAB — COMPREHENSIVE METABOLIC PANEL
ALK PHOS: 91 U/L (ref 39–117)
ALT: 11 U/L (ref 0–35)
AST: 17 U/L (ref 0–37)
Albumin: 3.5 g/dL (ref 3.5–5.2)
BILIRUBIN TOTAL: 0.8 mg/dL (ref 0.3–1.2)
BUN: 30 mg/dL — AB (ref 6–23)
CHLORIDE: 101 meq/L (ref 96–112)
CO2: 24 mEq/L (ref 19–32)
Calcium: 10 mg/dL (ref 8.4–10.5)
Creatinine, Ser: 1.46 mg/dL — ABNORMAL HIGH (ref 0.50–1.10)
GFR calc Af Amer: 36 mL/min — ABNORMAL LOW (ref >90)
GFR calc non Af Amer: 31 mL/min — ABNORMAL LOW (ref >90)
Glucose, Bld: 100 mg/dL — ABNORMAL HIGH (ref 70–99)
POTASSIUM: 4.4 meq/L (ref 3.7–5.3)
Sodium: 138 mEq/L (ref 137–147)
TOTAL PROTEIN: 7.4 g/dL (ref 6.0–8.3)

## 2014-04-13 LAB — MAGNESIUM: Magnesium: 2.2 mg/dL (ref 1.5–2.5)

## 2014-04-13 LAB — I-STAT TROPONIN, ED: TROPONIN I, POC: 0.04 ng/mL (ref 0.00–0.08)

## 2014-04-13 LAB — TROPONIN I: Troponin I: <0.3 ng/mL (ref <0.30)

## 2014-04-13 LAB — PRO B NATRIURETIC PEPTIDE: Pro B Natriuretic peptide (BNP): 4319 pg/mL — ABNORMAL HIGH (ref 0–450)

## 2014-04-13 MED ORDER — SIMVASTATIN 20 MG PO TABS
20.0000 mg | ORAL_TABLET | Freq: Every day | ORAL | Status: DC
  Administered 2014-04-13: 20 mg via ORAL
  Filled 2014-04-13 (×2): qty 1

## 2014-04-13 MED ORDER — SODIUM CHLORIDE 0.9 % IV SOLN: 250.0000 mL | INTRAVENOUS | Status: DC | PRN

## 2014-04-13 MED ORDER — ONDANSETRON HCL 4 MG/2ML IJ SOLN: 4.0000 mg | Freq: Four times a day (QID) | INTRAMUSCULAR | Status: DC | PRN

## 2014-04-13 MED ORDER — ASPIRIN EC 81 MG PO TBEC
81.0000 mg | DELAYED_RELEASE_TABLET | Freq: Every day | ORAL | Status: DC
  Administered 2014-04-13 – 2014-04-14 (×2): 81 mg via ORAL
  Filled 2014-04-13 (×2): qty 1

## 2014-04-13 MED ORDER — ACETAMINOPHEN 325 MG PO TABS: 650.0000 mg | ORAL_TABLET | ORAL | Status: DC | PRN

## 2014-04-13 MED ORDER — NITROGLYCERIN 0.4 MG SL SUBL: 0.4000 mg | SUBLINGUAL_TABLET | SUBLINGUAL | Status: DC | PRN

## 2014-04-13 MED ORDER — LORATADINE 10 MG PO TABS
10.0000 mg | ORAL_TABLET | Freq: Every day | ORAL | Status: DC
  Administered 2014-04-14: 10 mg via ORAL
  Filled 2014-04-13: qty 1

## 2014-04-13 MED ORDER — SODIUM CHLORIDE 0.9 % IJ SOLN
3.0000 mL | Freq: Two times a day (BID) | INTRAMUSCULAR | Status: DC
  Administered 2014-04-13 – 2014-04-14 (×2): 3 mL via INTRAVENOUS

## 2014-04-13 MED ORDER — TRAMADOL HCL 50 MG PO TABS: 50.0000 mg | ORAL_TABLET | Freq: Four times a day (QID) | ORAL | Status: DC | PRN

## 2014-04-13 MED ORDER — APIXABAN 5 MG PO TABS
5.0000 mg | ORAL_TABLET | Freq: Two times a day (BID) | ORAL | Status: DC
  Administered 2014-04-13 – 2014-04-14 (×2): 5 mg via ORAL
  Filled 2014-04-13 (×3): qty 1

## 2014-04-13 MED ORDER — FUROSEMIDE 10 MG/ML IJ SOLN
20.0000 mg | Freq: Once | INTRAMUSCULAR | Status: AC
  Administered 2014-04-13: 20 mg via INTRAVENOUS
  Filled 2014-04-13: qty 4

## 2014-04-13 MED ORDER — ASPIRIN EC 81 MG PO TBEC
81.0000 mg | DELAYED_RELEASE_TABLET | Freq: Every day | ORAL | Status: DC
  Filled 2014-04-13: qty 1

## 2014-04-13 MED ORDER — SODIUM CHLORIDE 0.9 % IJ SOLN: 3.0000 mL | INTRAMUSCULAR | Status: DC | PRN

## 2014-04-13 MED ORDER — CARVEDILOL 12.5 MG PO TABS
12.5000 mg | ORAL_TABLET | Freq: Two times a day (BID) | ORAL | Status: DC
  Administered 2014-04-14: 12.5 mg via ORAL
  Filled 2014-04-13 (×4): qty 1

## 2014-04-13 MED ORDER — AMIODARONE HCL 200 MG PO TABS
200.0000 mg | ORAL_TABLET | Freq: Two times a day (BID) | ORAL | Status: DC
  Administered 2014-04-13 – 2014-04-14 (×2): 200 mg via ORAL
  Filled 2014-04-13 (×3): qty 1

## 2014-04-13 MED ORDER — LEVOTHYROXINE SODIUM 75 MCG PO TABS
75.0000 ug | ORAL_TABLET | Freq: Every day | ORAL | Status: DC
  Administered 2014-04-14: 75 ug via ORAL
  Filled 2014-04-13 (×3): qty 1

## 2014-04-13 NOTE — ED Notes (Signed)
Patient currently denies chest pain and this time. States she feels her heart is beating really fast. Patient HR  90

## 2014-04-13 NOTE — ED Notes (Signed)
Patient will call when she has to urinate again. She used it before she came back to the room.

## 2014-04-13 NOTE — ED Provider Notes (Signed)
CSN: [id number]     Arrival date & time 04/13/14  1612 History   First MD Initiated Contact with Patient 04/13/14 1640     Chief Complaint  Patient presents with  . Chest Pain     (Consider location/radiation/quality/duration/timing/severity/associated sxs/prior Treatment) Patient is a 80 y.o. female presenting with chest pain. The history is provided by the patient and a relative.  Chest Pain Associated symptoms: palpitations and shortness of breath   Associated symptoms: no abdominal pain, no back pain, no cough, no fever, no headache, no numbness, not vomiting and no weakness   pt w hx afib, c/o feeling sob earlier this afternoon, at rest. States felt her breathing was off, then got anxious, felt more nervous about her breathing, and then felt tight in chest. Lasted several minutes, now improved. No current chest pain or discomfort. Mild sense palpitations earlier, not currently. No other recent cp or discomfort. Denies cough or uri c/o. No fever or chills. No leg pain or swelling. +orthopnea, no pnd. States compliant w normal meds. Pt had cardiac cath 01/29/14 showing diffuse, non obstructive disease, and had cardioversion 02/26/14 w conversion to nsr, which was short lived. At recent cardiology follow up was back in afib, is on amiodarone and eliquis.       Past Medical History  Diagnosis Date  . Dyslipidemia   . Hypertension   . Palpitations     PVCs/bigeminy on event in May 2009 revealing this was relatively asymptomatic  . Chest pain     a. 2011 Neg MV;  b. 01/2014 Cath: LM 20-30, LAD nl, D1 nl, LCX nl, OM1 nl, RCA dom 30-13m, PD/PL nl, EF 65%->Med Rx.  . Degenerative joint disease   . Hx of varicella   . History of skin cancer     tafeen/ non melanoma.   . Thrombocytopenia   . Anemia   . Monoclonal gammopathies   . Right lower quadrant abdominal pain 07/19/2012    Recurrent  With nausea    This is new since she had ct for llq pain in [facility]    R/o appendiceal problem  Hernia   Get ct scan  And plan  Fu     . Diastolic CHF, acute on chronic 01/31/2012  . Atrial fibrillation     a. Dx 01/2014->Eliquis started.   Past Surgical History  Procedure Laterality Date  . Cholecystectomy  1999  . Cesarean section      times 2  . Shoulder surgery  1996    Right   . Cataract extraction      Bilateral  implantt  . Cardioversion N/A 02/26/2014    Procedure: CARDIOVERSION AT BEDSIDE;  Surgeon: Pixie Casino, MD;  Location: [facility] OR;  Service: Cardiovascular;  Laterality: N/A;   Family History  Problem Relation Age of Onset  . Coronary artery disease Father   . Heart disease Father   . Lung cancer    . Alzheimer's disease Mother   . Cancer Brother 55    lung cancer   History  Substance Use Topics  . Smoking status: Never Smoker   . Smokeless tobacco: Never Used  . Alcohol Use: No   OB History   Grav Para Term Preterm Abortions TAB SAB Ect Mult Living                 Review of Systems  Constitutional: Negative for fever and chills.  HENT: Negative for sore throat.   Eyes: Negative for redness.  Respiratory: Positive  for shortness of breath. Negative for cough.   Cardiovascular: Positive for chest pain and palpitations. Negative for leg swelling.  Gastrointestinal: Negative for vomiting, abdominal pain and diarrhea.  Genitourinary: Negative for dysuria and flank pain.  Musculoskeletal: Negative for back pain and neck pain.  Skin: Negative for rash.  Neurological: Negative for weakness, numbness and headaches.  Hematological: Does not bruise/bleed easily.  Psychiatric/Behavioral: Negative for confusion.      Allergies  Review of patient's allergies indicates no known allergies.  Home Medications   Prior to Admission medications   Medication Sig Start Date End Date Taking? Authorizing Provider  amiodarone (PACERONE) 200 MG tablet Take 1 tablet (200 mg total) by mouth 2 (two) times daily. 03/14/14   Fay Records, MD  apixaban (ELIQUIS) 5 MG TABS tablet  Take 1 tablet (5 mg total) by mouth 2 (two) times daily. 01/30/14   Rogelia Mire, NP  aspirin EC 81 MG EC tablet Take 1 tablet (81 mg total) by mouth daily. 02/27/14   Tarri Fuller, PA-C  CALCIUM PO Take 1 tablet by mouth daily.    Historical Provider, MD  carvedilol (COREG) 12.5 MG tablet Take 1 tablet (12.5 mg total) by mouth 2 (two) times daily with a meal. 02/20/14   Burnis Medin, MD  furosemide (LASIX) 40 MG tablet Take 0.5 tablets (20 mg total) by mouth daily. 02/27/14   Tarri Fuller, PA-C  HYDROcodone-acetaminophen (NORCO/VICODIN) 5-325 MG per tablet Take 1 tablet every 4-6 hours as needed for pain. 03/01/14   Noland Fordyce, PA-C  levothyroxine (SYNTHROID, LEVOTHROID) 75 MCG tablet Take 1 tablet (75 mcg total) by mouth daily. 01/16/14   Fay Records, MD  nitroGLYCERIN (NITROSTAT) 0.4 MG SL tablet Place 1 tablet (0.4 mg total) under the tongue every 5 (five) minutes as needed. For chest pain 12/02/13   Fay Records, MD  simvastatin (ZOCOR) 20 MG tablet Take 1 tablet (20 mg total) by mouth at bedtime. 01/16/14   Fay Records, MD  traMADol Veatrice Bourbon) 50 MG tablet  02/28/14   Historical Provider, MD   BP 148/81  Pulse 95  Temp(Src) 98.6 F (37 C) (Oral)  Resp 18  SpO2 93% Physical Exam  Nursing note and vitals reviewed. Constitutional: She is oriented to person, place, and time. She appears well-developed and well-nourished. No distress.  HENT:  Mouth/Throat: Oropharynx is clear and moist.  Eyes: Conjunctivae are normal. Pupils are equal, round, and reactive to light. No scleral icterus.  Neck: Neck supple. No tracheal deviation present.  Cardiovascular: Normal rate, normal heart sounds and intact distal pulses.  Exam reveals no gallop and no friction rub.   No murmur heard. Irregularly irregular rhythm  Pulmonary/Chest: Effort normal. No respiratory distress.  Basilar rale  Abdominal: Soft. Normal appearance. She exhibits no distension. There is no tenderness.  Musculoskeletal: She  exhibits edema. She exhibits no tenderness.  Mild lower leg/ankle edema   Neurological: She is alert and oriented to person, place, and time.  Skin: Skin is warm and dry. No rash noted. She is not diaphoretic.  Psychiatric: She has a normal mood and affect.    ED Course  Procedures (including critical care time) Labs Review  Results for orders placed during the hospital encounter of 04/13/14  CBC      Result Value Ref Range   WBC 6.4  4.0 - 10.5 K/uL   RBC 4.50  3.87 - 5.11 MIL/uL   Hemoglobin 12.6  12.0 - 15.0 g/dL  HCT 39.8  36.0 - 46.0 %   MCV 88.4  78.0 - 100.0 fL   MCH 28.0  26.0 - 34.0 pg   MCHC 31.7  30.0 - 36.0 g/dL   RDW 14.6  11.5 - 15.5 %   Platelets 144 (*) 150 - 400 K/uL  PRO B NATRIURETIC PEPTIDE      Result Value Ref Range   Pro B Natriuretic peptide (BNP) 4319.0 (*) 0 - 450 pg/mL  COMPREHENSIVE METABOLIC PANEL      Result Value Ref Range   Sodium 138  137 - 147 mEq/L   Potassium 4.4  3.7 - 5.3 mEq/L   Chloride 101  96 - 112 mEq/L   CO2 24  19 - 32 mEq/L   Glucose, Bld 100 (*) 70 - 99 mg/dL   BUN 30 (*) 6 - 23 mg/dL   Creatinine, Ser 1.46 (*) 0.50 - 1.10 mg/dL   Calcium 10.0  8.4 - 10.5 mg/dL   Total Protein 7.4  6.0 - 8.3 g/dL   Albumin 3.5  3.5 - 5.2 g/dL   AST 17  0 - 37 U/L   ALT 11  0 - 35 U/L   Alkaline Phosphatase 91  39 - 117 U/L   Total Bilirubin 0.8  0.3 - 1.2 mg/dL   GFR calc non Af Amer 31 (*) >90 mL/min   GFR calc Af Amer 36 (*) >90 mL/min  TROPONIN I      Result Value Ref Range   Troponin I <0.30  <0.30 ng/mL  I-STAT TROPOININ, ED      Result Value Ref Range   Troponin i, poc 0.04  0.00 - 0.08 ng/mL   Comment 3            Dg Chest 2 View  04/13/2014   CLINICAL DATA:  Short of breath.  EXAM: CHEST  2 VIEW  COMPARISON:  02/24/2014  FINDINGS: Cardiac silhouette is mildly enlarged. Normal mediastinal contours. No hilar masses.  There are irregularly thickened interstitial markings bilaterally similar to the prior exam. No focal lung  consolidation. No pleural effusion or pneumothorax.  The bony thorax is demineralized but grossly intact.  IMPRESSION: Cardiomegaly with diffuse bilateral interstitial thickening suggests congestive heart failure. No focal consolidation to suggest lobar pneumonia.   Electronically Signed   By: Lajean Manes M.D.   On: 04/13/2014 17:06       EKG Interpretation   Date/Time:  Sunday Apr 13 2014 16:18:36 EDT Ventricular Rate:  100 PR Interval:    QRS Duration: 85 QT Interval:  450 QTC Calculation: 580 R Axis:   21 Text Interpretation:  Atrial fibrillation Borderline repolarization  abnormality Baseline wander in lead(s) I Confirmed by Ashok Cordia  MD, Lennette Bihari  ([id number]) on 04/13/2014 4:59:50 PM      MDM   Iv ns. Labs. Monitor. Ecg.   Reviewed nursing notes and prior charts for additional history.   cxr and bnp c/w chf.  ?whether pt not tolerating persistent afib.  Rate is controlled.  As cr sl elev, will give low dose/pts normal dose lasix. Lasix 20 iv.  Recheck no chest pain, no palpitations.  Given dyspnea, chf, afib, will admit to med service.       Mirna Mires, MD 04/13/14 [phone]

## 2014-04-13 NOTE — H&P (Signed)
[facility] History and Physical  MILLEY VINING KXF:[id number] DOB: 10-Nov-1925 DOA: 04/13/2014  Referring physician:  PCP: [name], MD  Specialists:   Chief Complaint: Chest pain, SOB  HPI: [name] is a 80 y.o. WF PMHx hypothyroidism, HLD, MGUS, HTN,  diastolic heart failure, admitted to the hospital on 3/15 underwent cardiac catheterization that showed mild nonobstructive disease and normal EF. Atrial relation S/P DCCV 02/26/14, started on Eliquis and amiodarone, cardiologist Dr. Wyatt Haste. Presenting with chest pain no abdominal pain, no back pain, no cough, no fever, no headache, no numbness, not vomiting and no weakness  Pt w hx afib, c/o feeling sob earlier this afternoon, at rest. States felt her breathing was off, then got anxious, felt more nervous about her breathing, and then felt tight in chest. Lasted several minutes, now improved. No current chest pain or discomfort. Mild sense palpitations earlier, not currently. No other recent cp or discomfort. Denies cough or uri c/o. No fever or chills. No leg pain or swelling. +orthopnea, no pnd. States compliant w normal meds. Pt had cardiac cath 01/29/14 showing diffuse, non obstructive disease, and had cardioversion 02/26/14 w conversion to nsr, which was short lived. At recent cardiology follow up was back in afib, is on amiodarone and eliquis.  Currently negative CP/SOB states initial signs and symptoms started around 1500 today, positive palpitations,, positive CP left breast area, positive radiation to right breast area, positive SOB, positive nausea, negative diaphoresis negative vomiting, positive dizziness. States signs and symptoms resolved after approximately 30 minutes. States did not take nitroglycerin. States last saw Dr. Wyatt Haste (cardiologist) on Friday. Base weight= 150 pounds. States weighs herself daily and her a.m. weight today= 154 pounds.     Review of Systems: The patient denies anorexia,  fever, weight loss,, vision loss, decreased hearing, hoarseness, syncope, peripheral edema, balance deficits, hemoptysis, abdominal pain, melena, hematochezia, severe indigestion/heartburn, hematuria, incontinence, genital sores, muscle weakness, suspicious skin lesions, transient blindness, difficulty walking, depression, abnormal bleeding, enlarged lymph nodes, angioedema, and breast masses.    TRAVEL HISTORY: NA   Consultants:  NA  Procedure/Significant Events:  02/25/2014 echocardiogram - Left ventricle: mild LVH. Focal basal hypertrophy.  -LVEF= 60% -65%.  - Left atrium: Mildly dilated. - Right atrium: Mildly dilated.  5/31 CXR -Cardiomegaly with diffuse bilateral interstitial thickening suggests CHF - No focal consolidation to suggest lobar pneumonia.   Culture  NA   Antibiotics:  NA   DVT prophylaxis:  Eliquis and SCD  Devices  NA   LINES / TUBES:  5/31 20ga right forearm   Past Medical History  Diagnosis Date  . Dyslipidemia   . Hypertension   . Palpitations     PVCs/bigeminy on event in May 2009 revealing this was relatively asymptomatic  . Chest pain     a. 2011 Neg MV;  b. 01/2014 Cath: LM 20-30, LAD nl, D1 nl, LCX nl, OM1 nl, RCA dom 30-31m, PD/PL nl, EF 65%->Med Rx.  . Degenerative joint disease   . Hx of varicella   . History of skin cancer     tafeen/ non melanoma.   . Thrombocytopenia   . Anemia   . Monoclonal gammopathies   . Right lower quadrant abdominal pain 07/19/2012    Recurrent  With nausea    This is new since she had ct for llq pain in [facility]    R/o appendiceal problem  Hernia  Get ct scan  And plan  Fu     . Diastolic CHF,  acute on chronic 01/31/2012  . Atrial fibrillation     a. Dx 01/2014->Eliquis started.   Past Surgical History  Procedure Laterality Date  . Cholecystectomy  1999  . Cesarean section      times 2  . Shoulder surgery  1996    Right   . Cataract extraction      Bilateral  implantt  . Cardioversion N/A 02/26/2014     Procedure: CARDIOVERSION AT BEDSIDE;  Surgeon: Pixie Casino, MD;  Location: [facility] OR;  Service: Cardiovascular;  Laterality: N/A;   Social History:  reports that she has never smoked. She has never used smokeless tobacco. She reports that she does not drink alcohol or use illicit drugs.   No Known Allergies  Family History  Problem Relation Age of Onset  . Coronary artery disease Father   . Heart disease Father   . Lung cancer    . Alzheimer's disease Mother   . Cancer Brother 49    lung cancer    Prior to Admission medications   Medication Sig Start Date End Date Taking? Authorizing Provider  amiodarone (PACERONE) 200 MG tablet Take 200 mg by mouth 2 (two) times daily. 03/14/14  Yes Fay Records, MD  apixaban (ELIQUIS) 5 MG TABS tablet Take 1 tablet (5 mg total) by mouth 2 (two) times daily. 01/30/14  Yes Rogelia Mire, NP  aspirin EC 81 MG EC tablet Take 1 tablet (81 mg total) by mouth daily. 02/27/14  Yes Tarri Fuller, PA-C  CALCIUM PO Take 1 tablet by mouth daily.   Yes Historical Provider, MD  carvedilol (COREG) 12.5 MG tablet Take 1 tablet (12.5 mg total) by mouth 2 (two) times daily with a meal. 02/20/14  Yes Burnis Medin, MD  furosemide (LASIX) 40 MG tablet Take 0.5 tablets (20 mg total) by mouth daily. 02/27/14  Yes Tarri Fuller, PA-C  HYDROcodone-acetaminophen (NORCO/VICODIN) 5-325 MG per tablet Take 1 tablet every 4-6 hours as needed for pain. 03/01/14  Yes Noland Fordyce, PA-C  levothyroxine (SYNTHROID, LEVOTHROID) 75 MCG tablet Take 1 tablet (75 mcg total) by mouth daily. 01/16/14  Yes Fay Records, MD  loratadine (CLARITIN) 10 MG tablet Take 10 mg by mouth daily.   Yes Historical Provider, MD  simvastatin (ZOCOR) 20 MG tablet Take 1 tablet (20 mg total) by mouth at bedtime. 01/16/14  Yes Fay Records, MD  traMADol (ULTRAM) 50 MG tablet Take 50 mg by mouth every 6 (six) hours as needed (pain).  02/28/14  Yes Historical Provider, MD  nitroGLYCERIN (NITROSTAT) 0.4 MG SL tablet  Place 1 tablet (0.4 mg total) under the tongue every 5 (five) minutes as needed. For chest pain 12/02/13   Fay Records, MD   Physical Exam: Filed Vitals:   04/13/14 1621 04/13/14 1800 04/13/14 1848 04/13/14 2004  BP: 148/81 134/50 134/50   Pulse: 95 87 103   Temp: 98.6 F (37 C)     TempSrc: Oral     Resp: 18 19 18    Height:    4\' 8"  (1.422 m)  Weight:    68.5 kg (151 lb 0.2 oz)  SpO2: 93% 92% 93%      General: A./O. x4, NAD  Eyes: Pupils equal reactive to light and accommodation  Neck: Negative JVD, negative lymphadenopathy  Cardiovascular: Irregular irregular rhythm and rate, negative murmurs rubs or gallops, DP/PT pulse +2 bilateral  Respiratory: Clear to auscultation bilateral  Abdomen: Soft, nontender, nondistended, plus bowel sound  Skin: Negative lacerations  Musculoskeletal: Negative pedal edema  Neurologic: Cranial nerves II through XII intact, muscle strength all extremities 5/5, sensation intact throughout  Labs on Admission:  Basic Metabolic Panel:  Recent Labs Lab 04/13/14 1650 04/13/14 1928  NA 138  --   K 4.4  --   CL 101  --   CO2 24  --   GLUCOSE 100*  --   BUN 30*  --   CREATININE 1.46*  --   CALCIUM 10.0  --   MG  --  2.2   Liver Function Tests:  Recent Labs Lab 04/13/14 1650  AST 17  ALT 11  ALKPHOS 91  BILITOT 0.8  PROT 7.4  ALBUMIN 3.5   No results found for this basename: LIPASE, AMYLASE,  in the last 168 hours No results found for this basename: AMMONIA,  in the last 168 hours CBC:  Recent Labs Lab 04/13/14 1646  WBC 6.4  HGB 12.6  HCT 39.8  MCV 88.4  PLT 144*   Cardiac Enzymes:  Recent Labs Lab 04/13/14 1650 04/13/14 1928  TROPONINI <0.30 <0.30    BNP (last 3 results)  Recent Labs  01/29/14 2038 02/24/14 1914 04/13/14 1646  PROBNP 2970.0* 3537.0* 4319.0*   CBG: No results found for this basename: GLUCAP,  in the last 168 hours  Radiological Exams on Admission: Dg Chest 2 View  04/13/2014    CLINICAL DATA:  Short of breath.  EXAM: CHEST  2 VIEW  COMPARISON:  02/24/2014  FINDINGS: Cardiac silhouette is mildly enlarged. Normal mediastinal contours. No hilar masses.  There are irregularly thickened interstitial markings bilaterally similar to the prior exam. No focal lung consolidation. No pleural effusion or pneumothorax.  The bony thorax is demineralized but grossly intact.  IMPRESSION: Cardiomegaly with diffuse bilateral interstitial thickening suggests congestive heart failure. No focal consolidation to suggest lobar pneumonia.   Electronically Signed   By: Lajean Manes M.D.   On: 04/13/2014 17:06    EKG: Atrial fibrillation, borderline rate control (100 bpm)   Assessment/Plan Principal Problem:   Acute on chronic diastolic CHF (congestive heart failure) Active Problems:   CAROTID ARTERY DISEASE   Shortness of breath   PVC's (premature ventricular contractions)   Hypothyroidism   MGUS (monoclonal gammopathy of unknown significance)   Hypertension   Acute renal insufficiency   Pulmonary edema    Acute on chronic diastolic CHF/CP -Heart score 7+ -ProBNP= 4319 with elevated above her previous high of 2537 in April 2015 -Troponin x1 negative, will continue cycle troponins -Patient above baseline; weight in ED 155.8 pounds; -Lasix 40 mg IV daily -Daily a.m. Weights -Strict I&O  Atrial fibrillation/pulmonary edema -Continue Eliquis -Continue amiodarone 200 mg BID -Continue Coreg 12.5 mg BID; currently rate controlled and BP control, however if patient exhibits A. fib with RVR would increase Coreg to 25 mg BID   CAD -Troponins x3 -Consult cardiology in a.m? Only if symptoms recur -Patient with recent cardiac cath in March, and DCCV in April, medical management most likely would be the result of consultation.  HTN -Stable see atrial fibrillation  SOB  -Resolved monitor overnight  Acute Renal insufficiency -Most likely secondary to CHF exacerbation should return  to normal with gentle diuresis  Hypothyroidism -Continue Synthroid 75 mcg daily -Obtain TSH level  HLD -Obtain lipid panel with a.m. Lab -Continue Zocor 20 mg daily  MGUS    Code Status: Full  Family Communication: Family present at bedside (indicate person spoken with, if applicable, with phone number if by telephone)  Disposition Plan:  Resolution of chest pain/SOB  Time spent: 60 minutes  [facility] Pager [phone]  If 7PM-7AM, please contact night-coverage [email or web address] Password [email or web address] 04/13/2014, 8:10 PM

## 2014-04-13 NOTE — ED Notes (Addendum)
Pt reports chest pain 49min PTA, has moved across chest and is now below L breast. Felt like a fluttering in her chest. Also c/o "fullness in her head, but not a headache." Denies diaphoresis, +nausea, denies vomiting.  Shob, feels as though she is working hard for a breath. +dizziness.

## 2014-04-14 LAB — LIPID PANEL
CHOLESTEROL: 141 mg/dL (ref 0–200)
HDL: 89 mg/dL (ref >39)
LDL Cholesterol: 41 mg/dL (ref 0–99)
TRIGLYCERIDES: 56 mg/dL (ref <150)
Total CHOL/HDL Ratio: 1.6 RATIO
VLDL: 11 mg/dL (ref 0–40)

## 2014-04-14 LAB — CBC WITH DIFFERENTIAL/PLATELET
BASOS ABS: 0 10*3/uL (ref 0.0–0.1)
Basophils Relative: 1 % (ref 0–1)
EOS PCT: 7 % — AB (ref 0–5)
Eosinophils Absolute: 0.5 10*3/uL (ref 0.0–0.7)
HCT: 36.8 % (ref 36.0–46.0)
Hemoglobin: 11.9 g/dL — ABNORMAL LOW (ref 12.0–15.0)
LYMPHS ABS: 0.9 10*3/uL (ref 0.7–4.0)
LYMPHS PCT: 14 % (ref 12–46)
MCH: 28.7 pg (ref 26.0–34.0)
MCHC: 32.3 g/dL (ref 30.0–36.0)
MCV: 88.7 fL (ref 78.0–100.0)
Monocytes Absolute: 0.7 10*3/uL (ref 0.1–1.0)
Monocytes Relative: 11 % (ref 3–12)
NEUTROS PCT: 67 % (ref 43–77)
Neutro Abs: 4.1 10*3/uL (ref 1.7–7.7)
PLATELETS: 127 10*3/uL — AB (ref 150–400)
RBC: 4.15 MIL/uL (ref 3.87–5.11)
RDW: 14.6 % (ref 11.5–15.5)
WBC: 6.1 10*3/uL (ref 4.0–10.5)

## 2014-04-14 LAB — COMPREHENSIVE METABOLIC PANEL
ALK PHOS: 75 U/L (ref 39–117)
ALT: 8 U/L (ref 0–35)
AST: 12 U/L (ref 0–37)
Albumin: 2.9 g/dL — ABNORMAL LOW (ref 3.5–5.2)
BUN: 27 mg/dL — ABNORMAL HIGH (ref 6–23)
CO2: 26 meq/L (ref 19–32)
Calcium: 9.4 mg/dL (ref 8.4–10.5)
Chloride: 102 mEq/L (ref 96–112)
Creatinine, Ser: 1.29 mg/dL — ABNORMAL HIGH (ref 0.50–1.10)
GFR calc Af Amer: 42 mL/min — ABNORMAL LOW (ref >90)
GFR calc non Af Amer: 36 mL/min — ABNORMAL LOW (ref >90)
Glucose, Bld: 95 mg/dL (ref 70–99)
POTASSIUM: 3.9 meq/L (ref 3.7–5.3)
SODIUM: 139 meq/L (ref 137–147)
Total Bilirubin: 1 mg/dL (ref 0.3–1.2)
Total Protein: 6.2 g/dL (ref 6.0–8.3)

## 2014-04-14 LAB — TSH: TSH: 4.45 u[IU]/mL (ref 0.350–4.500)

## 2014-04-14 LAB — TROPONIN I: Troponin I: <0.3 ng/mL (ref <0.30)

## 2014-04-14 NOTE — Progress Notes (Signed)
UR completed 

## 2014-04-14 NOTE — Progress Notes (Signed)
Talked to patient about DCP ; PCP: [name], MD; patient lives alone; CM talked to patient about [place] services, [facility] choices offered, patient chose [facility]; Nat Math with [facility] called for arrangements/ Disease Management program for CHF; Attending MD, if in agreement, please order HHC/ Disease Management Program for CHF at discharge; Aneta Mins [phone]

## 2014-04-14 NOTE — Discharge Summary (Signed)
Physician Discharge Summary  [name] PZW:[id number] DOB: [birth year] DOA: 04/13/2014  PCP: [name], MD  Admit date: 04/13/2014 Discharge date: 04/14/2014  Time spent: > 35 minutes  Recommendations for Outpatient Follow-up:  1. Please see d/c instructions listed below.  Discharge Diagnoses:  Principal Problem:   Acute on chronic diastolic CHF (congestive heart failure) Active Problems:   CAROTID ARTERY DISEASE   Shortness of breath   PVC's (premature ventricular contractions)   Hypothyroidism   MGUS (monoclonal gammopathy of unknown significance)   Hypertension   Acute renal insufficiency   Pulmonary edema   Discharge Condition: stable  Diet recommendation: low sodium heart healthy  Filed Weights   04/13/14 2004 04/14/14 0414  Weight: 68.5 kg (151 lb 0.2 oz) 68.5 kg (151 lb 0.2 oz)    History of present illness:  Pt is an 80 y/o CF PMHx hypothyroidism, HLD, MGUS, HTN, diastolic heart failure, admitted to the hospital on 3/15 underwent cardiac catheterization that showed mild nonobstructive disease and normal EF. Atrial relation S/P DCCV 02/26/14, started on Eliquis and amiodarone, cardiologist Dr. Wyatt Haste.  Pt was found to have an elevated BNP and is being treated for acute diastolic HF  Hospital Course:  Chest pain - resolved. Troponins negative x 3 - suspect psych component - pain reported at breast not typical for cardiac origin  Acute diastolic HF - Pt back at baseline dry weight per H and P of 150 lbs - Will continue home lasix regimen - Pt has been advised to weigh herself daily - H/H Nursing has been ordered to assist patient at home - Pt ambulated today and is refusing official PT evaluation prior to discharge.  Procedures:  None  Consultations:  None  Discharge Exam: Filed Vitals:   04/14/14 1454  BP: 108/55  Pulse: 68  Temp: 98.2 F (36.8 C)  Resp: 18    General: Pt in NAD, alert and awake Cardiovascular: RRR, no  MRG Respiratory: CTA BL, no wheezes, breathing comfortably on room air.  Discharge Instructions You were cared for by a hospitalist during your hospital stay. If you have any questions about your discharge medications or the care you received while you were in the hospital after you are discharged, you can call the unit and asked to speak with the hospitalist on call if the hospitalist that took care of you is not available. Once you are discharged, your primary care physician will handle any further medical issues. Please note that NO REFILLS for any discharge medications will be authorized once you are discharged, as it is imperative that you return to your primary care physician (or establish a relationship with a primary care physician if you do not have one) for your aftercare needs so that they can reassess your need for medications and monitor your lab values.  Discharge Instructions   Call MD for:  difficulty breathing, headache or visual disturbances    Complete by:  As directed      Call MD for:  temperature >100.4    Complete by:  As directed      Diet - low sodium heart healthy    Complete by:  As directed      Discharge instructions    Complete by:  As directed   Please be sure to weigh yourself daily in the morning after urinating.  Monitor your weight.  Follow up with your cardiologist or pcp in 1-2 weeks or sooner should any new concerns arise.     Increase  activity slowly    Complete by:  As directed             Medication List         amiodarone 200 MG tablet  Commonly known as:  PACERONE  Take 200 mg by mouth 2 (two) times daily.     apixaban 5 MG Tabs tablet  Commonly known as:  ELIQUIS  Take 1 tablet (5 mg total) by mouth 2 (two) times daily.     aspirin 81 MG EC tablet  Take 1 tablet (81 mg total) by mouth daily.     CALCIUM PO  Take 1 tablet by mouth daily.     carvedilol 12.5 MG tablet  Commonly known as:  COREG  Take 1 tablet (12.5 mg total) by  mouth 2 (two) times daily with a meal.     furosemide 40 MG tablet  Commonly known as:  LASIX  Take 0.5 tablets (20 mg total) by mouth daily.     HYDROcodone-acetaminophen 5-325 MG per tablet  Commonly known as:  NORCO/VICODIN  Take 1 tablet every 4-6 hours as needed for pain.     levothyroxine 75 MCG tablet  Commonly known as:  SYNTHROID, LEVOTHROID  Take 1 tablet (75 mcg total) by mouth daily.     loratadine 10 MG tablet  Commonly known as:  CLARITIN  Take 10 mg by mouth daily.     nitroGLYCERIN 0.4 MG SL tablet  Commonly known as:  NITROSTAT  Place 1 tablet (0.4 mg total) under the tongue every 5 (five) minutes as needed. For chest pain     simvastatin 20 MG tablet  Commonly known as:  ZOCOR  Take 1 tablet (20 mg total) by mouth at bedtime.     traMADol 50 MG tablet  Commonly known as:  ULTRAM  Take 50 mg by mouth every 6 (six) hours as needed (pain).       No Known Allergies    The results of significant diagnostics from this hospitalization (including imaging, microbiology, ancillary and laboratory) are listed below for reference.    Significant Diagnostic Studies: Dg Chest 2 View  04/13/2014   CLINICAL DATA:  Short of breath.  EXAM: CHEST  2 VIEW  COMPARISON:  02/24/2014  FINDINGS: Cardiac silhouette is mildly enlarged. Normal mediastinal contours. No hilar masses.  There are irregularly thickened interstitial markings bilaterally similar to the prior exam. No focal lung consolidation. No pleural effusion or pneumothorax.  The bony thorax is demineralized but grossly intact.  IMPRESSION: Cardiomegaly with diffuse bilateral interstitial thickening suggests congestive heart failure. No focal consolidation to suggest lobar pneumonia.   Electronically Signed   By: Lajean Manes M.D.   On: 04/13/2014 17:06    Microbiology: No results found for this or any previous visit (from the past 240 hour(s)).   Labs: Basic Metabolic Panel:  Recent Labs Lab 04/13/14 1650  04/13/14 1928 04/14/14 0656  NA 138  --  139  K 4.4  --  3.9  CL 101  --  102  CO2 24  --  26  GLUCOSE 100*  --  95  BUN 30*  --  27*  CREATININE 1.46*  --  1.29*  CALCIUM 10.0  --  9.4  MG  --  2.2  --    Liver Function Tests:  Recent Labs Lab 04/13/14 1650 04/14/14 0656  AST 17 12  ALT 11 8  ALKPHOS 91 75  BILITOT 0.8 1.0  PROT 7.4 6.2  ALBUMIN  3.5 2.9*   No results found for this basename: LIPASE, AMYLASE,  in the last 168 hours No results found for this basename: AMMONIA,  in the last 168 hours CBC:  Recent Labs Lab 04/13/14 1646 04/14/14 0656  WBC 6.4 6.1  NEUTROABS  --  4.1  HGB 12.6 11.9*  HCT 39.8 36.8  MCV 88.4 88.7  PLT 144* 127*   Cardiac Enzymes:  Recent Labs Lab 04/13/14 1650 04/13/14 1928 04/14/14 0055 04/14/14 0655  TROPONINI <0.30 <0.30 <0.30 <0.30   BNP: BNP (last 3 results)  Recent Labs  01/29/14 2038 02/24/14 1914 04/13/14 1646  PROBNP 2970.0* 3537.0* 4319.0*   CBG: No results found for this basename: GLUCAP,  in the last 168 hours     Signed:  [facility] 04/14/2014, 3:01 PM

## 2014-04-14 NOTE — Evaluation (Addendum)
Physical Therapy One Time Evaluation Patient Details Name: [name] MRN: [id number] DOB: 11/07/1925 Today's Date: 04/14/2014   History of Present Illness  Pt is an 80 year old female admitted with chest pain and diagnosed with acute on chronic CHF with PMHx hypothyroidism, HLD, MGUS, HTN, diastolic heart failure, cardiac catheterization that showed mild nonobstructive disease and normal EF,  S/P DCCV 02/26/14,  Clinical Impression  Patient evaluated by Physical Therapy with no further acute PT needs identified. All education has been completed and the patient has no further questions. Pt states she is at her baseline and declines further PT services at this time.  Recommended pt use RW however she states she does not like it and strongly prefers SPC.  Pt states she has no trouble with household tasks and still goes grocery shopping with daughter or alone.  See below for any follow-up Physial Therapy or equipment needs. PT is signing off. Thank you for this referral.     Follow Up Recommendations Home health PT (however pt declines at this time)    Equipment Recommendations  None recommended by PT    Recommendations for Other Services       Precautions / Restrictions Precautions Precautions: Fall      Mobility  Bed Mobility                  Transfers Overall transfer level: Needs assistance Equipment used: None Transfers: Sit to/from Stand Sit to Stand: Supervision            Ambulation/Gait Ambulation/Gait assistance: Supervision Ambulation Distance (Feet): 80 Feet Assistive device: Straight cane Gait Pattern/deviations: Step-through pattern;Trunk flexed;Wide base of support     General Gait Details: pt reports her ambulation is baseline, pt had SPC from home which was too high in height so adjusted to lowest possible notch which was still a little too tall for pt.  Stairs            Wheelchair Mobility    Modified Rankin (Stroke Patients  Only)       Balance Overall balance assessment: History of Falls;No apparent balance deficits (not formally assessed)                                           Pertinent Vitals/Pain No pain stated    Home Living Family/patient expects to be discharged to:: Private residence Living Arrangements: Alone   Type of Home: House Home Access: Stairs to enter Entrance Stairs-Rails: Can reach both;Left;Right Entrance Stairs-Number of Steps: 3 Home Layout: One level Home Equipment: [place] - 2 wheels;Cane - single point      Prior Function Level of Independence: Independent with assistive device(s)         Comments: typically uses SPC, does not like to use RW     Hand Dominance        Extremity/Trunk Assessment               Lower Extremity Assessment: Overall WFL for tasks assessed      Cervical / Trunk Assessment: Kyphotic  Communication   Communication: No difficulties  Cognition Arousal/Alertness: Awake/alert Behavior During Therapy: WFL for tasks assessed/performed Overall Cognitive Status: Within Functional Limits for tasks assessed                      General Comments General comments (skin integrity, edema, etc.): Pt  steady with SPC short distance, no LOB observed, pt agreeable to use RW if having increased back pain or feeling unsteady, reports hx of only one fall due to misplaced dust pan she tripped over    Exercises        Assessment/Plan    PT Assessment Patent does not need any further PT services  PT Diagnosis     PT Problem List    PT Treatment Interventions     PT Goals (Current goals can be found in the Care Plan section) Acute Rehab PT Goals PT Goal Formulation: No goals set, d/c therapy    Frequency     Barriers to discharge        Co-evaluation               End of Session   Activity Tolerance: Patient tolerated treatment well Patient left: in bed;with call bell/phone within reach Nurse  Communication: Mobility status (pt sitting EOB)    Functional Assessment Tool Used: clinical judgement Functional Limitation: Mobility: Walking and moving around Mobility: Walking and Moving Around Current Status ([id number]): At least 1 percent but less than 20 percent impaired, limited or restricted Mobility: Walking and Moving Around Goal Status [phone]): At least 1 percent but less than 20 percent impaired, limited or restricted Mobility: Walking and Moving Around Discharge Status [phone]): At least 1 percent but less than 20 percent impaired, limited or restricted    Time: 1410-1423 PT Time Calculation (min): 13 min   Charges:   PT Evaluation $Initial PT Evaluation Tier I: 1 Procedure     PT G Codes:   Functional Assessment Tool Used: clinical judgement Functional Limitation: Mobility: Walking and moving around    [employer] 04/14/2014, 2:56 PM Carmelia Bake, PT, DPT 04/14/2014 Pager: [phone]

## 2014-04-19 ENCOUNTER — Encounter (HOSPITAL_COMMUNITY): Payer: Self-pay | Admitting: Emergency Medicine

## 2014-04-19 ENCOUNTER — Emergency Department (HOSPITAL_COMMUNITY): Payer: Medicare Other

## 2014-04-19 ENCOUNTER — Inpatient Hospital Stay (HOSPITAL_COMMUNITY)
Admission: EM | Admit: 2014-04-19 | Discharge: 2014-05-02 | DRG: 242 | Disposition: A | Discharge status: Skilled Nursing Facility | Payer: Medicare Other | Attending: Internal Medicine | Admitting: Internal Medicine

## 2014-04-19 DIAGNOSIS — D62 Acute posthemorrhagic anemia: Secondary | ICD-10-CM | POA: Diagnosis not present

## 2014-04-19 DIAGNOSIS — M1990 Unspecified osteoarthritis, unspecified site: Secondary | ICD-10-CM | POA: Diagnosis present

## 2014-04-19 DIAGNOSIS — I959 Hypotension, unspecified: Secondary | ICD-10-CM

## 2014-04-19 DIAGNOSIS — R579 Shock, unspecified: Secondary | ICD-10-CM | POA: Diagnosis present

## 2014-04-19 DIAGNOSIS — N183 Chronic kidney disease, stage 3 unspecified: Secondary | ICD-10-CM

## 2014-04-19 DIAGNOSIS — IMO0002 Reserved for concepts with insufficient information to code with codable children: Secondary | ICD-10-CM

## 2014-04-19 DIAGNOSIS — E8809 Other disorders of plasma-protein metabolism, not elsewhere classified: Secondary | ICD-10-CM

## 2014-04-19 DIAGNOSIS — R0602 Shortness of breath: Secondary | ICD-10-CM

## 2014-04-19 DIAGNOSIS — Z950 Presence of cardiac pacemaker: Secondary | ICD-10-CM

## 2014-04-19 DIAGNOSIS — M79604 Pain in right leg: Secondary | ICD-10-CM

## 2014-04-19 DIAGNOSIS — T148XXA Other injury of unspecified body region, initial encounter: Secondary | ICD-10-CM

## 2014-04-19 DIAGNOSIS — N63 Unspecified lump in unspecified breast: Secondary | ICD-10-CM

## 2014-04-19 DIAGNOSIS — I10 Essential (primary) hypertension: Secondary | ICD-10-CM

## 2014-04-19 DIAGNOSIS — N289 Disorder of kidney and ureter, unspecified: Secondary | ICD-10-CM

## 2014-04-19 DIAGNOSIS — R739 Hyperglycemia, unspecified: Secondary | ICD-10-CM

## 2014-04-19 DIAGNOSIS — Z820 Family history of epilepsy and other diseases of the nervous system: Secondary | ICD-10-CM

## 2014-04-19 DIAGNOSIS — D472 Monoclonal gammopathy: Secondary | ICD-10-CM

## 2014-04-19 DIAGNOSIS — I509 Heart failure, unspecified: Secondary | ICD-10-CM

## 2014-04-19 DIAGNOSIS — Z9849 Cataract extraction status, unspecified eye: Secondary | ICD-10-CM

## 2014-04-19 DIAGNOSIS — R209 Unspecified disturbances of skin sensation: Secondary | ICD-10-CM

## 2014-04-19 DIAGNOSIS — D500 Iron deficiency anemia secondary to blood loss (chronic): Secondary | ICD-10-CM

## 2014-04-19 DIAGNOSIS — R300 Dysuria: Secondary | ICD-10-CM

## 2014-04-19 DIAGNOSIS — I480 Paroxysmal atrial fibrillation: Secondary | ICD-10-CM

## 2014-04-19 DIAGNOSIS — I469 Cardiac arrest, cause unspecified: Secondary | ICD-10-CM | POA: Diagnosis not present

## 2014-04-19 DIAGNOSIS — E785 Hyperlipidemia, unspecified: Secondary | ICD-10-CM

## 2014-04-19 DIAGNOSIS — R42 Dizziness and giddiness: Secondary | ICD-10-CM | POA: Diagnosis not present

## 2014-04-19 DIAGNOSIS — Z801 Family history of malignant neoplasm of trachea, bronchus and lung: Secondary | ICD-10-CM

## 2014-04-19 DIAGNOSIS — I440 Atrioventricular block, first degree: Secondary | ICD-10-CM | POA: Diagnosis present

## 2014-04-19 DIAGNOSIS — R1031 Right lower quadrant pain: Secondary | ICD-10-CM | POA: Diagnosis present

## 2014-04-19 DIAGNOSIS — N179 Acute kidney failure, unspecified: Secondary | ICD-10-CM | POA: Diagnosis not present

## 2014-04-19 DIAGNOSIS — R001 Bradycardia, unspecified: Secondary | ICD-10-CM

## 2014-04-19 DIAGNOSIS — K219 Gastro-esophageal reflux disease without esophagitis: Secondary | ICD-10-CM

## 2014-04-19 DIAGNOSIS — I999 Unspecified disorder of circulatory system: Secondary | ICD-10-CM

## 2014-04-19 DIAGNOSIS — D696 Thrombocytopenia, unspecified: Secondary | ICD-10-CM

## 2014-04-19 DIAGNOSIS — I4891 Unspecified atrial fibrillation: Secondary | ICD-10-CM

## 2014-04-19 DIAGNOSIS — Y831 Surgical operation with implant of artificial internal device as the cause of abnormal reaction of the patient, or of later complication, without mention of misadventure at the time of the procedure: Secondary | ICD-10-CM | POA: Diagnosis not present

## 2014-04-19 DIAGNOSIS — I2510 Atherosclerotic heart disease of native coronary artery without angina pectoris: Secondary | ICD-10-CM

## 2014-04-19 DIAGNOSIS — M51369 Other intervertebral disc degeneration, lumbar region without mention of lumbar back pain or lower extremity pain: Secondary | ICD-10-CM

## 2014-04-19 DIAGNOSIS — Z7982 Long term (current) use of aspirin: Secondary | ICD-10-CM

## 2014-04-19 DIAGNOSIS — I129 Hypertensive chronic kidney disease with stage 1 through stage 4 chronic kidney disease, or unspecified chronic kidney disease: Secondary | ICD-10-CM | POA: Diagnosis present

## 2014-04-19 DIAGNOSIS — Z7901 Long term (current) use of anticoagulants: Secondary | ICD-10-CM

## 2014-04-19 DIAGNOSIS — M8580 Other specified disorders of bone density and structure, unspecified site: Secondary | ICD-10-CM

## 2014-04-19 DIAGNOSIS — M5126 Other intervertebral disc displacement, lumbar region: Secondary | ICD-10-CM

## 2014-04-19 DIAGNOSIS — M5136 Other intervertebral disc degeneration, lumbar region: Secondary | ICD-10-CM

## 2014-04-19 DIAGNOSIS — I495 Sick sinus syndrome: Principal | ICD-10-CM | POA: Diagnosis present

## 2014-04-19 DIAGNOSIS — I493 Ventricular premature depolarization: Secondary | ICD-10-CM

## 2014-04-19 DIAGNOSIS — I200 Unstable angina: Secondary | ICD-10-CM

## 2014-04-19 DIAGNOSIS — R002 Palpitations: Secondary | ICD-10-CM | POA: Diagnosis present

## 2014-04-19 DIAGNOSIS — I700 Atherosclerosis of aorta: Secondary | ICD-10-CM | POA: Diagnosis present

## 2014-04-19 DIAGNOSIS — R0789 Other chest pain: Secondary | ICD-10-CM

## 2014-04-19 DIAGNOSIS — IMO0001 Reserved for inherently not codable concepts without codable children: Secondary | ICD-10-CM

## 2014-04-19 DIAGNOSIS — M5135 Other intervertebral disc degeneration, thoracolumbar region: Secondary | ICD-10-CM

## 2014-04-19 DIAGNOSIS — I214 Non-ST elevation (NSTEMI) myocardial infarction: Secondary | ICD-10-CM

## 2014-04-19 DIAGNOSIS — Z79899 Other long term (current) drug therapy: Secondary | ICD-10-CM

## 2014-04-19 DIAGNOSIS — I5033 Acute on chronic diastolic (congestive) heart failure: Secondary | ICD-10-CM

## 2014-04-19 DIAGNOSIS — Z8249 Family history of ischemic heart disease and other diseases of the circulatory system: Secondary | ICD-10-CM

## 2014-04-19 DIAGNOSIS — I4949 Other premature depolarization: Secondary | ICD-10-CM | POA: Diagnosis present

## 2014-04-19 DIAGNOSIS — Z85828 Personal history of other malignant neoplasm of skin: Secondary | ICD-10-CM

## 2014-04-19 DIAGNOSIS — D649 Anemia, unspecified: Secondary | ICD-10-CM

## 2014-04-19 DIAGNOSIS — I6529 Occlusion and stenosis of unspecified carotid artery: Secondary | ICD-10-CM

## 2014-04-19 DIAGNOSIS — I5032 Chronic diastolic (congestive) heart failure: Secondary | ICD-10-CM | POA: Diagnosis present

## 2014-04-19 DIAGNOSIS — E039 Hypothyroidism, unspecified: Secondary | ICD-10-CM

## 2014-04-19 DIAGNOSIS — R079 Chest pain, unspecified: Secondary | ICD-10-CM

## 2014-04-19 LAB — BASIC METABOLIC PANEL
BUN: 28 mg/dL — AB (ref 6–23)
CHLORIDE: 104 meq/L (ref 96–112)
CO2: 23 mEq/L (ref 19–32)
CREATININE: 1.39 mg/dL — AB (ref 0.50–1.10)
Calcium: 10 mg/dL (ref 8.4–10.5)
GFR calc Af Amer: 38 mL/min — ABNORMAL LOW (ref >90)
GFR calc non Af Amer: 33 mL/min — ABNORMAL LOW (ref >90)
GLUCOSE: 109 mg/dL — AB (ref 70–99)
POTASSIUM: 4.6 meq/L (ref 3.7–5.3)
Sodium: 141 mEq/L (ref 137–147)

## 2014-04-19 LAB — CBC
HCT: 37.9 % (ref 36.0–46.0)
HEMOGLOBIN: 12 g/dL (ref 12.0–15.0)
MCH: 28.2 pg (ref 26.0–34.0)
MCHC: 31.7 g/dL (ref 30.0–36.0)
MCV: 89.2 fL (ref 78.0–100.0)
Platelets: 173 10*3/uL (ref 150–400)
RBC: 4.25 MIL/uL (ref 3.87–5.11)
RDW: 14.9 % (ref 11.5–15.5)
WBC: 6.3 10*3/uL (ref 4.0–10.5)

## 2014-04-19 LAB — PRO B NATRIURETIC PEPTIDE: Pro B Natriuretic peptide (BNP): 3779 pg/mL — ABNORMAL HIGH (ref 0–450)

## 2014-04-19 LAB — I-STAT TROPONIN, ED: Troponin i, poc: 0.03 ng/mL (ref 0.00–0.08)

## 2014-04-19 MED ORDER — HYDROCODONE-ACETAMINOPHEN 5-325 MG PO TABS
1.0000 | ORAL_TABLET | ORAL | Status: DC | PRN
  Administered 2014-04-21: 1 via ORAL
  Filled 2014-04-19: qty 1

## 2014-04-19 MED ORDER — TRAMADOL HCL 50 MG PO TABS
50.0000 mg | ORAL_TABLET | Freq: Four times a day (QID) | ORAL | Status: DC | PRN
  Administered 2014-04-22 – 2014-04-23 (×3): 50 mg via ORAL
  Filled 2014-04-19 (×3): qty 1

## 2014-04-19 MED ORDER — CARVEDILOL 12.5 MG PO TABS
12.5000 mg | ORAL_TABLET | Freq: Two times a day (BID) | ORAL | Status: DC
Start: 2014-04-20 — End: 2014-04-21
  Administered 2014-04-20 – 2014-04-21 (×3): 12.5 mg via ORAL
  Filled 2014-04-19 (×5): qty 1

## 2014-04-19 MED ORDER — SODIUM CHLORIDE 0.9 % IV SOLN: 250.0000 mL | INTRAVENOUS | Status: DC | PRN

## 2014-04-19 MED ORDER — FUROSEMIDE 10 MG/ML IJ SOLN
40.0000 mg | Freq: Once | INTRAMUSCULAR | Status: AC
  Administered 2014-04-19: 40 mg via INTRAVENOUS
  Filled 2014-04-19: qty 4

## 2014-04-19 MED ORDER — ASPIRIN EC 81 MG PO TBEC
81.0000 mg | DELAYED_RELEASE_TABLET | Freq: Every day | ORAL | Status: DC
  Administered 2014-04-20 – 2014-04-23 (×4): 81 mg via ORAL
  Filled 2014-04-19 (×4): qty 1

## 2014-04-19 MED ORDER — SIMVASTATIN 20 MG PO TABS
20.0000 mg | ORAL_TABLET | Freq: Every day | ORAL | Status: DC
  Administered 2014-04-20 – 2014-04-22 (×4): 20 mg via ORAL
  Filled 2014-04-19 (×7): qty 1

## 2014-04-19 MED ORDER — SODIUM CHLORIDE 0.9 % IJ SOLN: 3.0000 mL | INTRAMUSCULAR | Status: DC | PRN

## 2014-04-19 MED ORDER — FUROSEMIDE 40 MG PO TABS
40.0000 mg | ORAL_TABLET | Freq: Every day | ORAL | Status: DC
  Administered 2014-04-20 – 2014-04-21 (×2): 40 mg via ORAL
  Filled 2014-04-19 (×2): qty 1

## 2014-04-19 MED ORDER — LORATADINE 10 MG PO TABS
10.0000 mg | ORAL_TABLET | Freq: Every day | ORAL | Status: DC
  Administered 2014-04-20 – 2014-04-23 (×4): 10 mg via ORAL
  Filled 2014-04-19 (×4): qty 1

## 2014-04-19 MED ORDER — SODIUM CHLORIDE 0.9 % IJ SOLN
3.0000 mL | Freq: Two times a day (BID) | INTRAMUSCULAR | Status: DC
  Administered 2014-04-20 – 2014-04-23 (×7): 3 mL via INTRAVENOUS

## 2014-04-19 MED ORDER — AMIODARONE HCL 200 MG PO TABS
200.0000 mg | ORAL_TABLET | Freq: Two times a day (BID) | ORAL | Status: DC
  Administered 2014-04-20 – 2014-04-22 (×6): 200 mg via ORAL
  Filled 2014-04-19 (×8): qty 1

## 2014-04-19 MED ORDER — APIXABAN 5 MG PO TABS
5.0000 mg | ORAL_TABLET | Freq: Two times a day (BID) | ORAL | Status: DC
  Administered 2014-04-20 – 2014-04-21 (×5): 5 mg via ORAL
  Filled 2014-04-19 (×9): qty 1

## 2014-04-19 MED ORDER — LEVOTHYROXINE SODIUM 75 MCG PO TABS
75.0000 ug | ORAL_TABLET | Freq: Every day | ORAL | Status: DC
  Administered 2014-04-20 – 2014-04-22 (×3): 75 ug via ORAL
  Filled 2014-04-19 (×4): qty 1

## 2014-04-19 NOTE — ED Notes (Signed)
Report attempted x 1

## 2014-04-19 NOTE — ED Provider Notes (Signed)
CSN: [id number]     Arrival date & time 04/19/14  1707 History   First MD Initiated Contact with Patient 04/19/14 2028     Chief Complaint  Patient presents with  . Shortness of Breath     (Consider location/radiation/quality/duration/timing/severity/associated sxs/prior Treatment) Patient is a 80 y.o. female presenting with shortness of breath. The history is provided by the patient.  Shortness of Breath  patient here with acute onset of dyspnea which occurred at rest right arrival. Symptoms lasted for 2-3 hours that since resolved. She was discharged from hospital 5 days ago after an admission for CHF. Denies any anginal type chest pain at this time. She has had increased cough as well as some orthopnea but denies any dyspnea on exertion. Patient feels that she needs to have her dose of Lasix increased. No fever or chills. No vomiting or diarrhea. No new treatments used prior to arrival  Past Medical History  Diagnosis Date  . Dyslipidemia   . Hypertension   . Palpitations     PVCs/bigeminy on event in May 2009 revealing this was relatively asymptomatic  . Chest pain     a. 2011 Neg MV;  b. 01/2014 Cath: LM 20-30, LAD nl, D1 nl, LCX nl, OM1 nl, RCA dom 30-67m, PD/PL nl, EF 65%->Med Rx.  . Degenerative joint disease   . Hx of varicella   . History of skin cancer     tafeen/ non melanoma.   . Thrombocytopenia   . Anemia   . Monoclonal gammopathies   . Right lower quadrant abdominal pain 07/19/2012    Recurrent  With nausea    This is new since she had ct for llq pain in [facility]    R/o appendiceal problem  Hernia  Get ct scan  And plan  Fu     . Diastolic CHF, acute on chronic 01/31/2012  . Atrial fibrillation     a. Dx 01/2014->Eliquis started.   Past Surgical History  Procedure Laterality Date  . Cholecystectomy  1999  . Cesarean section      times 2  . Shoulder surgery  1996    Right   . Cataract extraction      Bilateral  implantt  . Cardioversion N/A 02/26/2014    Procedure:  CARDIOVERSION AT BEDSIDE;  Surgeon: Pixie Casino, MD;  Location: [facility] OR;  Service: Cardiovascular;  Laterality: N/A;   Family History  Problem Relation Age of Onset  . Coronary artery disease Father   . Heart disease Father   . Lung cancer    . Alzheimer's disease Mother   . Cancer Brother 65    lung cancer   History  Substance Use Topics  . Smoking status: Never Smoker   . Smokeless tobacco: Never Used  . Alcohol Use: No   OB History   Grav Para Term Preterm Abortions TAB SAB Ect Mult Living                 Review of Systems  Respiratory: Positive for shortness of breath.   All other systems reviewed and are negative.     Allergies  Review of patient's allergies indicates no known allergies.  Home Medications   Prior to Admission medications   Medication Sig Start Date End Date Taking? Authorizing Provider  amiodarone (PACERONE) 200 MG tablet Take 200 mg by mouth 2 (two) times daily. 03/14/14   Fay Records, MD  apixaban (ELIQUIS) 5 MG TABS tablet Take 1 tablet (5 mg total) by  mouth 2 (two) times daily. 01/30/14   Rogelia Mire, NP  aspirin EC 81 MG EC tablet Take 1 tablet (81 mg total) by mouth daily. 02/27/14   Tarri Fuller, PA-C  CALCIUM PO Take 1 tablet by mouth daily.    Historical Provider, MD  carvedilol (COREG) 12.5 MG tablet Take 1 tablet (12.5 mg total) by mouth 2 (two) times daily with a meal. 02/20/14   Burnis Medin, MD  furosemide (LASIX) 40 MG tablet Take 0.5 tablets (20 mg total) by mouth daily. 02/27/14   Tarri Fuller, PA-C  HYDROcodone-acetaminophen (NORCO/VICODIN) 5-325 MG per tablet Take 1 tablet every 4-6 hours as needed for pain. 03/01/14   Noland Fordyce, PA-C  levothyroxine (SYNTHROID, LEVOTHROID) 75 MCG tablet Take 1 tablet (75 mcg total) by mouth daily. 01/16/14   Fay Records, MD  loratadine (CLARITIN) 10 MG tablet Take 10 mg by mouth daily.    Historical Provider, MD  nitroGLYCERIN (NITROSTAT) 0.4 MG SL tablet Place 1 tablet (0.4 mg total) under  the tongue every 5 (five) minutes as needed. For chest pain 12/02/13   Fay Records, MD  simvastatin (ZOCOR) 20 MG tablet Take 1 tablet (20 mg total) by mouth at bedtime. 01/16/14   Fay Records, MD  traMADol (ULTRAM) 50 MG tablet Take 50 mg by mouth every 6 (six) hours as needed (pain).  02/28/14   Historical Provider, MD   BP 133/77  Pulse 88  Temp(Src) 97.9 F (36.6 C) (Oral)  Resp 20  SpO2 96% Physical Exam  Nursing note and vitals reviewed. Constitutional: She is oriented to person, place, and time. She appears well-developed and well-nourished.  Non-toxic appearance. No distress.  HENT:  Head: Normocephalic and atraumatic.  Eyes: Conjunctivae, EOM and lids are normal. Pupils are equal, round, and reactive to light.  Neck: Normal range of motion. Neck supple. No tracheal deviation present. No mass present.  Cardiovascular: Normal rate, regular rhythm and normal heart sounds.  Exam reveals no gallop.   No murmur heard. Pulmonary/Chest: Effort normal. No stridor. No respiratory distress. She has decreased breath sounds. She has no wheezes. She has no rhonchi. She has no rales.  Abdominal: Soft. Normal appearance and bowel sounds are normal. She exhibits no distension. There is no tenderness. There is no rebound and no CVA tenderness.  Musculoskeletal: Normal range of motion. She exhibits no edema and no tenderness.  Neurological: She is alert and oriented to person, place, and time. She has normal strength. No cranial nerve deficit or sensory deficit. GCS eye subscore is 4. GCS verbal subscore is 5. GCS motor subscore is 6.  Skin: Skin is warm and dry. No abrasion and no rash noted.  Psychiatric: She has a normal mood and affect. Her speech is normal and behavior is normal.    ED Course  Procedures (including critical care time) Labs Review Labs Reviewed  BASIC METABOLIC PANEL - Abnormal; Notable for the following:    Glucose, Bld 109 (*)    BUN 28 (*)    Creatinine, Ser 1.39 (*)     GFR calc non Af Amer 33 (*)    GFR calc Af Amer 38 (*)    All other components within normal limits  PRO B NATRIURETIC PEPTIDE - Abnormal; Notable for the following:    Pro B Natriuretic peptide (BNP) 3779.0 (*)    All other components within normal limits  CBC  I-STAT TROPOININ, ED    Imaging Review Dg Chest 2 View  04/19/2014   CLINICAL DATA:  Shortness of breath and wheezing  EXAM: CHEST  2 VIEW  COMPARISON:  Apr 13, 2014  FINDINGS: There remains cardiomegaly mild pulmonary venous hypertension. There is a mild degree of interstitial edema, minimally less than on recent prior study. There is no airspace consolidation. No new opacity. No adenopathy. There is thoracolumbar levoscoliosis. There is postoperative change in the right shoulder.  IMPRESSION: Evidence of a degree of congestive heart failure with marginally less edema compared to recent prior study. No airspace consolidation.   Electronically Signed   By: Lowella Grip M.D.   On: 04/19/2014 19:03     EKG Interpretation None      MDM   Final diagnoses:  None     Date: 04/19/2014  Rate: 91  Rhythm: atrial fibrillation  QRS Axis: normal  Intervals: normal  ST/T Wave abnormalities: nonspecific ST changes  Conduction Disutrbances:none  Narrative Interpretation:   Old EKG Reviewed: none available  Pt given lasix and will be admitted by [facility]      Leota Jacobsen, MD 04/19/14 2118

## 2014-04-19 NOTE — H&P (Signed)
PCP:  [name], MD  Cardiology [facility]  Chief Complaint:  Shortness of breath HPI: [name] is a 80 y.o. female   has a past medical history of Dyslipidemia; Hypertension; Palpitations; Chest pain; Degenerative joint disease; varicella; History of skin cancer; Thrombocytopenia; Anemia; Monoclonal gammopathies; Right lower quadrant abdominal pain (07/19/2012); Diastolic CHF, acute on chronic (01/31/2012); and Atrial fibrillation.   Presented with Sudden onset of dyspnea while watching TV that has rapidly resolved but she did call EMS. Patient has been recently admitted with acute on chronic diastolic CHF exacerbation and was discharged to home 04/14/14 on Lasix 20 mg po q day that seems to be half of her home dose. Patient states she was taken 40 mg prior to admission.  She did well initial y after discharge but this Am noted worsening leg swelling and her weight went up to 154 lb from dry weight of 152 lb. (of note in the past dry weight was recorded as 150 lb) Patient denies any chest pain. She received Lasix 40 mg iV in ER has diuresed since then. She states that her shortness of breath have resolved even prior to arrival to ER.   Patietn has hx of A.fib on amiodarone and Eliquis. Last cardiac cath was 3/15 and showed mild nonobstructive disease and normal EF. She had DCCV on 02/26/14 but went back to A.fib. Last echo: 02/25/2014 echocardiogram  - Left ventricle: mild LVH. Focal basal hypertrophy.  -LVEF= 60% -65%.  - Left atrium: Mildly dilated. - Right atrium: Mildly dilated.   Hospitalist was called for admission for mild CHF exacerbation  Review of Systems:    Pertinent positives include:  shortness of breath at rest. Bilateral lower extremity swelling   Constitutional:  No weight loss, night sweats, Fevers, chills, fatigue, weight loss  HEENT:  No headaches, Difficulty swallowing,Tooth/dental problems,Sore throat,  No sneezing, itching, ear ache, nasal  congestion, post nasal drip,  Cardio-vascular:  No chest pain, Orthopnea, PND, anasarca, dizziness, palpitations  GI:  No heartburn, indigestion, abdominal pain, nausea, vomiting, diarrhea, change in bowel habits, loss of appetite, melena, blood in stool, hematemesis Resp:  no No dyspnea on exertion, No excess mucus, no productive cough, No non-productive cough, No coughing up of blood.No change in color of mucus.No wheezing. Skin:  no rash or lesions. No jaundice GU:  no dysuria, change in color of urine, no urgency or frequency. No straining to urinate.  No flank pain.  Musculoskeletal:  No joint pain or no joint swelling. No decreased range of motion. No back pain.  Psych:  No change in mood or affect. No depression or anxiety. No memory loss.  Neuro: no localizing neurological complaints, no tingling, no weakness, no double vision, no gait abnormality, no slurred speech, no confusion  Otherwise ROS are negative except for above, 10 systems were reviewed  Past Medical History: Past Medical History  Diagnosis Date  . Dyslipidemia   . Hypertension   . Palpitations     PVCs/bigeminy on event in May 2009 revealing this was relatively asymptomatic  . Chest pain     a. 2011 Neg MV;  b. 01/2014 Cath: LM 20-30, LAD nl, D1 nl, LCX nl, OM1 nl, RCA dom 30-64m, PD/PL nl, EF 65%->Med Rx.  . Degenerative joint disease   . Hx of varicella   . History of skin cancer     tafeen/ non melanoma.   . Thrombocytopenia   . Anemia   . Monoclonal gammopathies   . Right  lower quadrant abdominal pain 07/19/2012    Recurrent  With nausea    This is new since she had ct for llq pain in [facility]    R/o appendiceal problem  Hernia  Get ct scan  And plan  Fu     . Diastolic CHF, acute on chronic 01/31/2012  . Atrial fibrillation     a. Dx 01/2014->Eliquis started.   Past Surgical History  Procedure Laterality Date  . Cholecystectomy  1999  . Cesarean section      times 2  . Shoulder surgery  1996    Right    . Cataract extraction      Bilateral  implantt  . Cardioversion N/A 02/26/2014    Procedure: CARDIOVERSION AT BEDSIDE;  Surgeon: Pixie Casino, MD;  Location: [facility] OR;  Service: Cardiovascular;  Laterality: N/A;     Medications: Prior to Admission medications   Medication Sig Start Date End Date Taking? Authorizing Provider  amiodarone (PACERONE) 200 MG tablet Take 200 mg by mouth 2 (two) times daily. 03/14/14  Yes Fay Records, MD  apixaban (ELIQUIS) 5 MG TABS tablet Take 1 tablet (5 mg total) by mouth 2 (two) times daily. 01/30/14  Yes Rogelia Mire, NP  aspirin EC 81 MG EC tablet Take 1 tablet (81 mg total) by mouth daily. 02/27/14  Yes Tarri Fuller, PA-C  CALCIUM PO Take 1 tablet by mouth daily.   Yes Historical Provider, MD  carvedilol (COREG) 12.5 MG tablet Take 1 tablet (12.5 mg total) by mouth 2 (two) times daily with a meal. 02/20/14  Yes Burnis Medin, MD  furosemide (LASIX) 40 MG tablet Take 0.5 tablets (20 mg total) by mouth daily. 02/27/14  Yes Tarri Fuller, PA-C  HYDROcodone-acetaminophen (NORCO/VICODIN) 5-325 MG per tablet Take 1 tablet every 4-6 hours as needed for pain. 03/01/14  Yes Noland Fordyce, PA-C  levothyroxine (SYNTHROID, LEVOTHROID) 75 MCG tablet Take 1 tablet (75 mcg total) by mouth daily. 01/16/14  Yes Fay Records, MD  loratadine (CLARITIN) 10 MG tablet Take 10 mg by mouth daily.   Yes Historical Provider, MD  nitroGLYCERIN (NITROSTAT) 0.4 MG SL tablet Place 1 tablet (0.4 mg total) under the tongue every 5 (five) minutes as needed. For chest pain 12/02/13  Yes Fay Records, MD  simvastatin (ZOCOR) 20 MG tablet Take 1 tablet (20 mg total) by mouth at bedtime. 01/16/14  Yes Fay Records, MD  traMADol (ULTRAM) 50 MG tablet Take 50 mg by mouth every 6 (six) hours as needed (pain).  02/28/14  Yes Historical Provider, MD    Allergies:  No Known Allergies  Social History:  Ambulatory   independently   Lives at home alone,        reports that she has never smoked. She  has never used smokeless tobacco. She reports that she does not drink alcohol or use illicit drugs.    Family History: family history includes Alzheimer's disease in her mother; Cancer (age of onset: 63) in her brother; Coronary artery disease in her father; Heart disease in her father; Lung cancer in an other family member.    Physical Exam: Patient Vitals for the past 24 hrs:  BP Temp Temp src Pulse Resp SpO2 Height Weight  04/19/14 2112 - - - - - - 4' 7.91" (1.42 m) 68.5 kg (151 lb 0.2 oz)  04/19/14 1714 133/77 mmHg 97.9 F (36.6 C) Oral 88 20 96 % - -    1. General:  in No  Acute distress 2. Psychological: Alert and Oriented 3. Head/ENT:   Moist Mucous Membranes                          Head Non traumatic, neck supple                          Normal  Dentition 4. SKIN: normal  Skin turgor,  Skin clean Dry and intact no rash 5. Heart: irregular rate and rhythm systolic Murmur, Rub or gallop 6. Lungs:   no wheezes occasional mild crackles   7. Abdomen: Soft, non-tender, Non distended 8. Lower extremities: no clubbing, cyanosis, or edema 9. Neurologically Grossly intact, moving all 4 extremities equally 10. MSK: Normal range of motion  body mass index is 33.97 kg/(m^2).   Labs on Admission:   Recent Labs  04/19/14 1722  NA 141  K 4.6  CL 104  CO2 23  GLUCOSE 109*  BUN 28*  CREATININE 1.39*  CALCIUM 10.0   No results found for this basename: AST, ALT, ALKPHOS, BILITOT, PROT, ALBUMIN,  in the last 72 hours No results found for this basename: LIPASE, AMYLASE,  in the last 72 hours  Recent Labs  04/19/14 1722  WBC 6.3  HGB 12.0  HCT 37.9  MCV 89.2  PLT 173   No results found for this basename: CKTOTAL, CKMB, CKMBINDEX, TROPONINI,  in the last 72 hours No results found for this basename: TSH, T4TOTAL, FREET3, T3FREE, THYROIDAB,  in the last 72 hours No results found for this basename: VITAMINB12, FOLATE, FERRITIN, TIBC, IRON, RETICCTPCT,  in the last 72 hours No  results found for this basename: HGBA1C    Estimated Creatinine Clearance: 22.1 ml/min (by C-G formula based on Cr of 1.39). ABG    Component Value Date/Time   TCO2 23 11/01/2013 0505     No results found for this basename: DDIMER     Other results:  I have pearsonaly reviewed this: ECG REPORT   Rhythm: A.fib ST&T Change: no evidence of ischemia  BNP (last 3 results)  Recent Labs  02/24/14 1914 04/13/14 1646 04/19/14 1722  PROBNP 3537.0* 4319.0* 3779.0*    Filed Weights   04/19/14 2112  Weight: 68.5 kg (151 lb 0.2 oz)     Cultures:    Component Value Date/Time   SDES URINE, RANDOM 01/31/2012 0038   SPECREQUEST NONE 01/31/2012 0038   CULT Multiple bacterial morphotypes present, none predominant. Suggest appropriate recollection if clinically indicated. 01/31/2012 0038   REPTSTATUS 02/01/2012 FINAL 01/31/2012 0038     Radiological Exams on Admission: Dg Chest 2 View  04/19/2014   CLINICAL DATA:  Shortness of breath and wheezing  EXAM: CHEST  2 VIEW  COMPARISON:  Apr 13, 2014  FINDINGS: There remains cardiomegaly mild pulmonary venous hypertension. There is a mild degree of interstitial edema, minimally less than on recent prior study. There is no airspace consolidation. No new opacity. No adenopathy. There is thoracolumbar levoscoliosis. There is postoperative change in the right shoulder.  IMPRESSION: Evidence of a degree of congestive heart failure with marginally less edema compared to recent prior study. No airspace consolidation.   Electronically Signed   By: Lowella Grip M.D.   On: 04/19/2014 19:03    Chart has been reviewed  Assessment/Plan  80 yo F with known hx of A.fib and diastolic CHF here with mild CHF exacerbation  Present on Admission:  . Acute exacerbation of  CHF (congestive heart failure) - given elevated Cr and good diuresis with lasix 40 IV in ER will restart on lasix 40 mg po q day as it was her prior dose. Cycle CE,  . Atrial fibrillation -  continue amiodarone and eliquis . Coronary disease non obstucted  - cycle CE ,continue aspirin an statin . Hypertension - currently well controlled, continue home meds   Prophylaxis: eliquis  CODE STATUS:  FULL CODE    Other plan as per orders.  I have spent a total of 55 min on this admission  Tayna Smethurst 04/19/2014, 9:25 PM

## 2014-04-19 NOTE — ED Notes (Addendum)
She states she was discharged last week from hospital for "heart trouble." today she began to feel her heart "pounding and racing again and it felt like i couldn't breathe." A&Ox4, resp e/u now. Denies any pain

## 2014-04-20 LAB — TROPONIN I
Troponin I: <0.3 ng/mL (ref <0.30)
Troponin I: <0.3 ng/mL (ref <0.30)

## 2014-04-20 LAB — BASIC METABOLIC PANEL
BUN: 26 mg/dL — AB (ref 6–23)
CHLORIDE: 103 meq/L (ref 96–112)
CO2: 27 meq/L (ref 19–32)
Calcium: 9.6 mg/dL (ref 8.4–10.5)
Creatinine, Ser: 1.43 mg/dL — ABNORMAL HIGH (ref 0.50–1.10)
GFR calc Af Amer: 37 mL/min — ABNORMAL LOW (ref >90)
GFR calc non Af Amer: 32 mL/min — ABNORMAL LOW (ref >90)
GLUCOSE: 109 mg/dL — AB (ref 70–99)
Potassium: 3.9 mEq/L (ref 3.7–5.3)
SODIUM: 143 meq/L (ref 137–147)

## 2014-04-20 LAB — PRO B NATRIURETIC PEPTIDE: Pro B Natriuretic peptide (BNP): 3528 pg/mL — ABNORMAL HIGH (ref 0–450)

## 2014-04-20 MED ORDER — GI COCKTAIL ~~LOC~~
30.0000 mL | Freq: Once | ORAL | Status: AC
  Administered 2014-04-20: 30 mL via ORAL
  Filled 2014-04-20: qty 30

## 2014-04-20 NOTE — Progress Notes (Signed)
[facility] PROGRESS NOTE  [name] NWG:[id number] DOB: 1925/03/14 DOA: 04/19/2014 PCP: [name], MD  Assessment/Plan: 1. Acute diastolic congestive heart failure. -Transthoracic echocardiogram performed on 02/26/2014 showing ejection fraction of 60-65% without wall motion abnormalities -She presents with clinical signs symptoms suggestive of acute CHF, with initial lab showed an elevated BNP of 3779 -She initially received 40 mg of IV Lasix, change to 40 mg by mouth daily this morning. - Overnight she diuresed 1.1 L  2.  Atrial fibrillation -She remains rate controlled with heart rates in the 70s to 80s -Continue amiodarone and beta blocker therapy with Coreg  3. Anticoagulation -Continue apixaban  4. Hypertension -Stable, continue Coreg  Code Status: Full Family Communication:  Disposition Plan: Continue supportive care, diuresis, anticipate discharge to home in the next 24 hours    HPI/Subjective: Is a pleasant 80 year old with a past medical history diastolic congestive heart failure, admitted to the medicine service on 04/19/2014 when she presented with complaints of shortness of breath. Initial labs revealed a BNP of 3779 as chest x-ray revealed degree of congestive heart failure without consolidation. She was started on IV Lasix.   Objective: Filed Vitals:   04/20/14 0930  BP: 115/64  Pulse: 85  Temp: 97.8 F (36.6 C)  Resp: 20    Intake/Output Summary (Last 24 hours) at 04/20/14 1349 Last data filed at 04/20/14 0940  Gross per 24 hour  Intake    240 ml  Output   1300 ml  Net  -1060 ml   Filed Weights   04/19/14 2112 04/19/14 2338  Weight: 68.5 kg (151 lb 0.2 oz) 68.493 kg (151 lb)    Exam:   General:  Patient reports having some dizziness and nausea, otherwise in no acute distress awake alert oriented x3  Cardiovascular: 2-6 systolic ejection murmur  Respiratory: Clear to auscultation and a few by basilar crackles normal  respiratory effort  Abdomen: Soft nontender nondistended  Musculoskeletal: No edema   Data Reviewed: Basic Metabolic Panel:  Recent Labs Lab 04/13/14 1650 04/13/14 1928 04/14/14 0656 04/19/14 1722 04/20/14 0408  NA 138  --  139 141 143  K 4.4  --  3.9 4.6 3.9  CL 101  --  102 104 103  CO2 24  --  26 23 27   GLUCOSE 100*  --  95 109* 109*  BUN 30*  --  27* 28* 26*  CREATININE 1.46*  --  1.29* 1.39* 1.43*  CALCIUM 10.0  --  9.4 10.0 9.6  MG  --  2.2  --   --   --    Liver Function Tests:  Recent Labs Lab 04/13/14 1650 04/14/14 0656  AST 17 12  ALT 11 8  ALKPHOS 91 75  BILITOT 0.8 1.0  PROT 7.4 6.2  ALBUMIN 3.5 2.9*   No results found for this basename: LIPASE, AMYLASE,  in the last 168 hours No results found for this basename: AMMONIA,  in the last 168 hours CBC:  Recent Labs Lab 04/13/14 1646 04/14/14 0656 04/19/14 1722  WBC 6.4 6.1 6.3  NEUTROABS  --  4.1  --   HGB 12.6 11.9* 12.0  HCT 39.8 36.8 37.9  MCV 88.4 88.7 89.2  PLT 144* 127* 173   Cardiac Enzymes:  Recent Labs Lab 04/14/14 0055 04/14/14 0655 04/19/14 0052 04/20/14 0408 04/20/14 1049  TROPONINI <0.30 <0.30 <0.30 <0.30 <0.30   BNP (last 3 results)  Recent Labs  04/13/14 1646 04/19/14 1722 04/20/14 0408  PROBNP 4319.0* 3779.0*  3528.0*   CBG: No results found for this basename: GLUCAP,  in the last 168 hours  No results found for this or any previous visit (from the past 240 hour(s)).   Studies: Dg Chest 2 View  04/19/2014   CLINICAL DATA:  Shortness of breath and wheezing  EXAM: CHEST  2 VIEW  COMPARISON:  Apr 13, 2014  FINDINGS: There remains cardiomegaly mild pulmonary venous hypertension. There is a mild degree of interstitial edema, minimally less than on recent prior study. There is no airspace consolidation. No new opacity. No adenopathy. There is thoracolumbar levoscoliosis. There is postoperative change in the right shoulder.  IMPRESSION: Evidence of a degree of congestive  heart failure with marginally less edema compared to recent prior study. No airspace consolidation.   Electronically Signed   By: Lowella Grip M.D.   On: 04/19/2014 19:03    Scheduled Meds: . amiodarone  200 mg Oral BID  . apixaban  5 mg Oral BID  . aspirin EC  81 mg Oral Daily  . carvedilol  12.5 mg Oral BID WC  . furosemide  40 mg Oral Daily  . levothyroxine  75 mcg Oral QAC breakfast  . loratadine  10 mg Oral Daily  . simvastatin  20 mg Oral QHS  . sodium chloride  3 mL Intravenous Q12H   Continuous Infusions:   Active Problems:   Shortness of breath   Atrial fibrillation   Coronary disease non obstucted    Hypertension   Acute on chronic diastolic CHF (congestive heart failure)   Chronic anticoagulation   Acute exacerbation of CHF (congestive heart failure)    Time spent: 30 min    [facility] Pager [phone]. If 7PM-7AM, please contact night-coverage at [email or web address], password [facility] 04/20/2014, 1:49 PM  LOS: 1 day

## 2014-04-20 NOTE — Progress Notes (Signed)
Pt called with c/o Feeling lightheaded, SOB and nauseated. 2l Winston applied and pt placed back in bed . Vs 108/47 rhythm a- fib with no changes. Ekg done. Dr Coralyn Pear paged

## 2014-04-21 DIAGNOSIS — I5033 Acute on chronic diastolic (congestive) heart failure: Secondary | ICD-10-CM

## 2014-04-21 DIAGNOSIS — I959 Hypotension, unspecified: Secondary | ICD-10-CM

## 2014-04-21 DIAGNOSIS — R079 Chest pain, unspecified: Secondary | ICD-10-CM

## 2014-04-21 DIAGNOSIS — I509 Heart failure, unspecified: Secondary | ICD-10-CM

## 2014-04-21 LAB — CBC
HCT: 37.2 % (ref 36.0–46.0)
Hemoglobin: 11.8 g/dL — ABNORMAL LOW (ref 12.0–15.0)
MCH: 28.2 pg (ref 26.0–34.0)
MCHC: 31.7 g/dL (ref 30.0–36.0)
MCV: 88.8 fL (ref 78.0–100.0)
Platelets: 167 10*3/uL (ref 150–400)
RBC: 4.19 MIL/uL (ref 3.87–5.11)
RDW: 14.8 % (ref 11.5–15.5)
WBC: 5.7 10*3/uL (ref 4.0–10.5)

## 2014-04-21 LAB — BASIC METABOLIC PANEL
BUN: 28 mg/dL — ABNORMAL HIGH (ref 6–23)
CO2: 29 mEq/L (ref 19–32)
Calcium: 9.7 mg/dL (ref 8.4–10.5)
Chloride: 100 mEq/L (ref 96–112)
Creatinine, Ser: 1.47 mg/dL — ABNORMAL HIGH (ref 0.50–1.10)
GFR calc Af Amer: 36 mL/min — ABNORMAL LOW (ref >90)
GFR calc non Af Amer: 31 mL/min — ABNORMAL LOW (ref >90)
Glucose, Bld: 93 mg/dL (ref 70–99)
Potassium: 4.1 mEq/L (ref 3.7–5.3)
Sodium: 141 mEq/L (ref 137–147)

## 2014-04-21 LAB — TROPONIN I
Troponin I: <0.3 ng/mL (ref <0.30)
Troponin I: <0.3 ng/mL (ref <0.30)

## 2014-04-21 MED ORDER — CARVEDILOL 6.25 MG PO TABS
6.2500 mg | ORAL_TABLET | Freq: Two times a day (BID) | ORAL | Status: DC
  Filled 2014-04-21 (×2): qty 1

## 2014-04-21 MED ORDER — CARVEDILOL 3.125 MG PO TABS
3.1250 mg | ORAL_TABLET | Freq: Two times a day (BID) | ORAL | Status: DC
  Administered 2014-04-21 – 2014-04-22 (×2): 3.125 mg via ORAL
  Filled 2014-04-21 (×4): qty 1
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
## 2014-04-21 NOTE — Evaluation (Signed)
Physical Therapy Evaluation Patient Details Name: OGECHI KUEHNEL MRN: [id number] DOB: 1925/07/31 Today's Date: 04/21/2014   History of Present Illness  Pt is 80 yo female admiited due to sudden onset of dyspnea while watching tv. Pt recently admitted witha ctue on chronic diastolic CHF exacerbation d/c'd home 04/14/14. Pt with PMH of HTN, chest pain, DJD, thrombocytopenia, aneamia, CHF and Afib.  Clinical Impression  Pt with increased falls risk and is much more stable and safe with RW during ambulation. Pt currently unsafe to return home alone due to increased falls risk due to dizziness however pt declines any assist or help reporting "i'm safe in my home, I don't fall there." Acute PT to follow to progress ambulation and stair negotiation.    Follow Up Recommendations Home health PT;Supervision/Assistance - 24 hour (however pt declining)    Equipment Recommendations   (daughter to have RW pt can use)    Recommendations for Other Services       Precautions / Restrictions Precautions Precautions: Fall Restrictions Weight Bearing Restrictions: No      Mobility  Bed Mobility Overal bed mobility: Modified Independent             General bed mobility comments: pt with use of bed rail to pull self up to EOB  Transfers Overall transfer level: Needs assistance Equipment used: None Transfers: Sit to/from Stand Sit to Stand: Min guard         General transfer comment: pt required increased time, guarded for safety due to instability due to mild dizziness  Ambulation/Gait Ambulation/Gait assistance: Min guard Ambulation Distance (Feet): 75 Feet (x2) Assistive device: Rolling walker (2 wheeled);Straight cane       General Gait Details: pt extremely unsteady with cane with short, antalgic, wobbly steps. Pt with much improved stability with RW and more fluid gait pattern with increased step lenght and minimal antalgia. Pt reports she feels better with the RW.  Stairs             Wheelchair Mobility    Modified Rankin (Stroke Patients Only)       Balance Overall balance assessment: History of Falls                                           Pertinent Vitals/Pain Denies pain    Home Living Family/patient expects to be discharged to:: Private residence Living Arrangements: Alone Available Help at Discharge: Family;Available PRN/intermittently Type of Home: House Home Access: Stairs to enter Entrance Stairs-Rails: Can reach both;Left;Right Entrance Stairs-Number of Steps: 3 Home Layout: One level Home Equipment: Walker - 2 wheels;Cane - single point;Grab bars - tub/shower;Grab bars - toilet      Prior Function Level of Independence: Independent with assistive device(s)         Comments: typically uses SPC, does not like to use RW     Hand Dominance   Dominant Hand: Right    Extremity/Trunk Assessment   Upper Extremity Assessment: Generalized weakness           Lower Extremity Assessment: Generalized weakness      Cervical / Trunk Assessment: Kyphotic  Communication   Communication: No difficulties  Cognition Arousal/Alertness: Awake/alert Behavior During Therapy: WFL for tasks assessed/performed Overall Cognitive Status: Within Functional Limits for tasks assessed (reports [facility] hospital but re-oriented to cone)  General Comments General comments (skin integrity, edema, etc.): pt with need to use restroom during ambulation due to lasix. Pt able to perform hygiene with supervision'    Exercises        Assessment/Plan    PT Assessment Patient needs continued PT services  PT Diagnosis Difficulty walking;Generalized weakness   PT Problem List Decreased strength;Decreased activity tolerance;Decreased balance  PT Treatment Interventions DME instruction;Gait training;Stair training;Functional mobility training;Therapeutic activities;Therapeutic exercise   PT Goals  (Current goals can be found in the Care Plan section) Acute Rehab PT Goals Patient Stated Goal: to go home PT Goal Formulation: With patient Time For Goal Achievement: 04/28/14 Potential to Achieve Goals: Good    Frequency Min 3X/week   Barriers to discharge Decreased caregiver support      Co-evaluation               End of Session Equipment Utilized During Treatment: Gait belt Activity Tolerance: Patient tolerated treatment well Patient left: in chair;with call bell/phone within reach Nurse Communication: Mobility status    Functional Assessment Tool Used: clinical judgment Functional Limitation: Mobility: Walking and moving around Mobility: Walking and Moving Around Current Status [phone]): At least 20 percent but less than 40 percent impaired, limited or restricted Mobility: Walking and Moving Around Goal Status [phone]): At least 1 percent but less than 20 percent impaired, limited or restricted    Time: 1512-1536 PT Time Calculation (min): 24 min   Charges:   PT Evaluation $Initial PT Evaluation Tier I: 1 Procedure PT Treatments $Gait Training: 8-22 mins   PT G Codes:   Functional Assessment Tool Used: clinical judgment Functional Limitation: Mobility: Walking and moving around    [employer] 04/21/2014, 3:45 PM  Kittie Plater, PT, DPT Pager #: [phone] Office #: [phone]

## 2014-04-21 NOTE — Progress Notes (Signed)
I agree with 04/20/14 7a-7p assessment of Kalman Drape LPN

## 2014-04-21 NOTE — Progress Notes (Addendum)
[facility] PROGRESS NOTE  [name] EXB:[id number] DOB: June 16, 1925 DOA: 04/19/2014 PCP: [name], MD  Assessment/Plan: 1. Acute diastolic congestive heart failure. -Transthoracic echocardiogram performed on 02/26/2014 showing ejection fraction of 60-65% without wall motion abnormalities -She presents with clinical signs symptoms suggestive of acute CHF, with initial lab showed an elevated BNP of 3779 -She initially received 40 mg of IV Lasix, change to 40 mg by mouth daily this morning. - She diuresed 1,600 mL yesterday, had been on Lasix 40 mg PO q daily, had episode of hypotension today. Will hold diuresis until seen by cardiology.   2.  Dizziness -Patient reporting dizziness today likely resulting from hypotension -I held diuretic therapy and decreased her Coreg dose to 3.25 mg PO BID from 12.5 BID  3.  Hypotension -Likely secondary to antihypertensive agents and diuresis -Systolic blood pressures in the 70's to 90's this morning, likely explaining her dizziness.  -Coreg decreased to 3.25 mg PO BID and lasix held until seen by cardiology.   4. Chest Pain -Overnight patient complaining of chest pain -Had cath in march of 2015 which showed nonobstructive CAD. Cardiac enzymes were cycled and remained negative.  -Patient had low BP's in the 90's overnight. Decreased beta blocker dose. Cardiology consulted.   5.  Atrial fibrillation -She remains rate controlled with heart rates in the 70s to 80s -Continue amiodarone and beta blocker therapy with Coreg  6. Anticoagulation -Continue apixaban  7. Hypertension -Stable, continue Coreg  Code Status: Full Family Communication:  Disposition Plan: Cardiology consulted    HPI/Subjective: Is a pleasant 80 year old with a past medical history diastolic congestive heart failure, admitted to the medicine service on 04/19/2014 when she presented with complaints of shortness of breath. Initial labs revealed a BNP of  3779 as chest x-ray revealed degree of congestive heart failure without consolidation. She was started on IV Lasix.   Objective: Filed Vitals:   04/21/14 1340  BP: 93/55  Pulse: 80  Temp: 97.7 F (36.5 C)  Resp: 18    Intake/Output Summary (Last 24 hours) at 04/21/14 1455 Last data filed at 04/21/14 1308  Gross per 24 hour  Intake   1140 ml  Output   1300 ml  Net   -160 ml   Filed Weights   04/19/14 2112 04/19/14 2338 04/21/14 0701  Weight: 68.5 kg (151 lb 0.2 oz) 68.493 kg (151 lb) 68.21 kg (150 lb 6 oz)    Exam:   General:  Patient reports having some dizziness and nausea, otherwise in no acute distress awake alert oriented x3  Cardiovascular: 2-6 systolic ejection murmur  Respiratory: Clear to auscultation and a few by basilar crackles normal respiratory effort  Abdomen: Soft nontender nondistended  Musculoskeletal: No edema   Data Reviewed: Basic Metabolic Panel:  Recent Labs Lab 04/19/14 1722 04/20/14 0408 04/21/14 0104  NA 141 143 141  K 4.6 3.9 4.1  CL 104 103 100  CO2 23 27 29   GLUCOSE 109* 109* 93  BUN 28* 26* 28*  CREATININE 1.39* 1.43* 1.47*  CALCIUM 10.0 9.6 9.7   Liver Function Tests: No results found for this basename: AST, ALT, ALKPHOS, BILITOT, PROT, ALBUMIN,  in the last 168 hours No results found for this basename: LIPASE, AMYLASE,  in the last 168 hours No results found for this basename: AMMONIA,  in the last 168 hours CBC:  Recent Labs Lab 04/19/14 1722 04/21/14 0104  WBC 6.3 5.7  HGB 12.0 11.8*  HCT 37.9 37.2  MCV 89.2 88.8  PLT 173 167   Cardiac Enzymes:  Recent Labs Lab 04/20/14 0408 04/20/14 1049 04/20/14 2030 04/21/14 0104 04/21/14 0638  TROPONINI <0.30 <0.30 <0.30 <0.30 <0.30   BNP (last 3 results)  Recent Labs  04/13/14 1646 04/19/14 1722 04/20/14 0408  PROBNP 4319.0* 3779.0* 3528.0*   CBG: No results found for this basename: GLUCAP,  in the last 168 hours  No results found for this or any  previous visit (from the past 240 hour(s)).   Studies: Dg Chest 2 View  04/19/2014   CLINICAL DATA:  Shortness of breath and wheezing  EXAM: CHEST  2 VIEW  COMPARISON:  Apr 13, 2014  FINDINGS: There remains cardiomegaly mild pulmonary venous hypertension. There is a mild degree of interstitial edema, minimally less than on recent prior study. There is no airspace consolidation. No new opacity. No adenopathy. There is thoracolumbar levoscoliosis. There is postoperative change in the right shoulder.  IMPRESSION: Evidence of a degree of congestive heart failure with marginally less edema compared to recent prior study. No airspace consolidation.   Electronically Signed   By: Lowella Grip M.D.   On: 04/19/2014 19:03    Scheduled Meds: . amiodarone  200 mg Oral BID  . apixaban  5 mg Oral BID  . aspirin EC  81 mg Oral Daily  . carvedilol  12.5 mg Oral BID WC  . furosemide  40 mg Oral Daily  . levothyroxine  75 mcg Oral QAC breakfast  . loratadine  10 mg Oral Daily  . simvastatin  20 mg Oral QHS  . sodium chloride  3 mL Intravenous Q12H   Continuous Infusions:   Active Problems:   Shortness of breath   Atrial fibrillation   Coronary disease non obstucted    Hypertension   Acute on chronic diastolic CHF (congestive heart failure)   Chronic anticoagulation   Acute exacerbation of CHF (congestive heart failure)    Time spent: 53 min    [place] Pager [phone]. If 7PM-7AM, please contact night-coverage at [email or web address], password [facility] 04/21/2014, 2:55 PM  LOS: 2 days

## 2014-04-21 NOTE — Progress Notes (Signed)
Pt alert and oriented x 4. Sitting on bedside. Ambulates in room to [facility] when needed. Denies pain and discomfort. No respiratory distress noted.

## 2014-04-22 ENCOUNTER — Ambulatory Visit: Payer: Medicare Other | Admitting: Internal Medicine

## 2014-04-22 ENCOUNTER — Encounter (HOSPITAL_COMMUNITY)
Admission: EM | Disposition: A | Discharge status: Skilled Nursing Facility | Payer: Self-pay | Source: Home / Self Care | Attending: Internal Medicine

## 2014-04-22 ENCOUNTER — Observation Stay (HOSPITAL_COMMUNITY): Payer: Medicare Other | Admitting: Anesthesiology

## 2014-04-22 ENCOUNTER — Encounter (HOSPITAL_COMMUNITY): Payer: Self-pay | Admitting: Anesthesiology

## 2014-04-22 ENCOUNTER — Encounter (HOSPITAL_COMMUNITY): Payer: Medicare Other | Admitting: Anesthesiology

## 2014-04-22 ENCOUNTER — Ambulatory Visit (HOSPITAL_COMMUNITY): Admit: 2014-04-22 | Payer: Self-pay | Admitting: Cardiovascular Disease

## 2014-04-22 DIAGNOSIS — I498 Other specified cardiac arrhythmias: Secondary | ICD-10-CM

## 2014-04-22 DIAGNOSIS — I4891 Unspecified atrial fibrillation: Secondary | ICD-10-CM

## 2014-04-22 DIAGNOSIS — Z7901 Long term (current) use of anticoagulants: Secondary | ICD-10-CM

## 2014-04-22 DIAGNOSIS — R570 Cardiogenic shock: Secondary | ICD-10-CM

## 2014-04-22 DIAGNOSIS — R0789 Other chest pain: Secondary | ICD-10-CM

## 2014-04-22 DIAGNOSIS — N183 Chronic kidney disease, stage 3 unspecified: Secondary | ICD-10-CM | POA: Diagnosis present

## 2014-04-22 DIAGNOSIS — D649 Anemia, unspecified: Secondary | ICD-10-CM

## 2014-04-22 HISTORY — PX: CARDIOVERSION: SHX1299

## 2014-04-22 LAB — BASIC METABOLIC PANEL
BUN: 27 mg/dL — ABNORMAL HIGH (ref 6–23)
CALCIUM: 9.5 mg/dL (ref 8.4–10.5)
CHLORIDE: 99 meq/L (ref 96–112)
CO2: 30 meq/L (ref 19–32)
Creatinine, Ser: 1.65 mg/dL — ABNORMAL HIGH (ref 0.50–1.10)
GFR calc Af Amer: 31 mL/min — ABNORMAL LOW (ref >90)
GFR calc non Af Amer: 27 mL/min — ABNORMAL LOW (ref >90)
GLUCOSE: 93 mg/dL (ref 70–99)
Potassium: 4.3 mEq/L (ref 3.7–5.3)
SODIUM: 140 meq/L (ref 137–147)

## 2014-04-22 SURGERY — CARDIOVERSION
Anesthesia: Monitor Anesthesia Care

## 2014-04-22 SURGERY — TEMPORARY PACEMAKER INSERTION

## 2014-04-22 MED ORDER — PROPOFOL 10 MG/ML IV BOLUS
INTRAVENOUS | Status: AC
  Filled 2014-04-22: qty 20

## 2014-04-22 MED ORDER — PHENYLEPHRINE HCL 10 MG/ML IJ SOLN
INTRAMUSCULAR | Status: DC | PRN
  Administered 2014-04-22: 80 ug via INTRAVENOUS

## 2014-04-22 MED ORDER — DEXTROSE 5 % IV SOLN
30.0000 ug/min | INTRAVENOUS | Status: DC
  Administered 2014-04-22: 30 ug/min via INTRAVENOUS
  Filled 2014-04-22 (×2): qty 1

## 2014-04-22 MED ORDER — AMIODARONE HCL 200 MG PO TABS
200.0000 mg | ORAL_TABLET | Freq: Every day | ORAL | Status: DC
  Administered 2014-04-23 – 2014-05-02 (×10): 200 mg via ORAL
  Filled 2014-04-22 (×10): qty 1

## 2014-04-22 MED ORDER — LIDOCAINE HCL (CARDIAC) 20 MG/ML IV SOLN
INTRAVENOUS | Status: DC | PRN
  Administered 2014-04-22: 25 mg via INTRAVENOUS

## 2014-04-22 MED ORDER — APIXABAN 2.5 MG PO TABS
2.5000 mg | ORAL_TABLET | Freq: Two times a day (BID) | ORAL | Status: DC
  Administered 2014-04-22 – 2014-04-24 (×3): 2.5 mg via ORAL
  Filled 2014-04-22 (×5): qty 1

## 2014-04-22 MED ORDER — ONDANSETRON HCL 4 MG/2ML IJ SOLN
INTRAMUSCULAR | Status: AC
  Filled 2014-04-22: qty 2

## 2014-04-22 MED ORDER — ATROPINE SULFATE 0.1 MG/ML IJ SOLN
0.5000 mg | Freq: Once | INTRAMUSCULAR | Status: AC
  Administered 2014-04-22: 0.5 mg via INTRAVENOUS

## 2014-04-22 MED ORDER — SODIUM CHLORIDE 0.9 % IV SOLN
INTRAVENOUS | Status: DC | PRN
  Administered 2014-04-22: 13:00:00 via INTRAVENOUS

## 2014-04-22 MED ORDER — SODIUM CHLORIDE 0.9 % IV SOLN
INTRAVENOUS | Status: DC
  Administered 2014-04-23 – 2014-04-25 (×4): via INTRAVENOUS

## 2014-04-22 MED ORDER — LEVOTHYROXINE SODIUM 75 MCG PO TABS
75.0000 ug | ORAL_TABLET | Freq: Every day | ORAL | Status: DC
  Administered 2014-04-23: 75 ug via ORAL
  Filled 2014-04-22 (×2): qty 1

## 2014-04-22 MED ORDER — LIDOCAINE HCL (PF) 1 % IJ SOLN
INTRAMUSCULAR | Status: AC
  Filled 2014-04-22: qty 30

## 2014-04-22 MED ORDER — DOPAMINE-DEXTROSE 3.2-5 MG/ML-% IV SOLN
5.0000 ug/kg/min | INTRAVENOUS | Status: DC
  Administered 2014-04-22: 5 ug/kg/min via INTRAVENOUS

## 2014-04-22 MED ORDER — ONDANSETRON HCL 4 MG/2ML IJ SOLN
4.0000 mg | Freq: Four times a day (QID) | INTRAMUSCULAR | Status: DC | PRN
  Administered 2014-04-22: 4 mg via INTRAVENOUS
  Filled 2014-04-22: qty 2

## 2014-04-22 MED ORDER — DOPAMINE-DEXTROSE 3.2-5 MG/ML-% IV SOLN
2.0000 ug/kg/min | INTRAVENOUS | Status: DC
  Administered 2014-04-22: 10 ug/kg/min via INTRAVENOUS

## 2014-04-22 MED ORDER — PROMETHAZINE HCL 25 MG/ML IJ SOLN
12.5000 mg | Freq: Four times a day (QID) | INTRAMUSCULAR | Status: DC | PRN
Start: 2014-04-22 — End: 2014-04-23
  Administered 2014-04-22: 12.5 mg via INTRAVENOUS
  Filled 2014-04-22: qty 1

## 2014-04-22 MED ORDER — LIDOCAINE HCL (CARDIAC) 20 MG/ML IV SOLN
INTRAVENOUS | Status: AC
  Filled 2014-04-22: qty 5

## 2014-04-22 MED ORDER — PROPOFOL 10 MG/ML IV BOLUS
INTRAVENOUS | Status: DC | PRN
  Administered 2014-04-22: 60 mg via INTRAVENOUS

## 2014-04-22 MED ORDER — SODIUM CHLORIDE 0.9 % IV BOLUS (SEPSIS)
500.0000 mL | Freq: Once | INTRAVENOUS | Status: AC
  Administered 2014-04-22: 500 mL via INTRAVENOUS

## 2014-04-22 MED FILL — Medication: Qty: 1 | Status: AC

## 2014-04-22 NOTE — CV Procedure (Addendum)
Electrical Cardioversion Procedure Note [name] [id number] 08-Feb-1925  Procedure: Electrical Cardioversion Indications:  Atrial Fibrillation  Time Out: Verified patient identification, verified procedure,medications/allergies/relevent history reviewed, required imaging and test results available.  Performed  Procedure Details  The patient was NPO after midnight. Anesthesia was administered at the beside  by Dr.Joslin with 60mg  of propofol and Lidocaine 20mg .  Cardioversion was done with synchronized biphasic defibrillation with AP pads with 150watts.  The patient converted to sinus bradycardia rhythm. The patient tolerated the procedure well   IMPRESSION:  Successful cardioversion of atrial fibrillation Sinus bradycardia with HR 45bpm - will stop Coreg    Traci R Turner 04/22/2014, 12:46 PM
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
## 2014-04-22 NOTE — Interval H&P Note (Signed)
History and Physical Interval Note:  04/22/2014 9:07 PM  [name]  has presented today for surgery, with the diagnosis of AFIB  The various methods of treatment have been discussed with the patient and family. After consideration of risks, benefits and other options for treatment, the patient has consented to   Temporary Transvenous Pacemaker placement as a surgical intervention .  The patient's history has been reviewed, patient examined, no change in status, stable for surgery.  I have reviewed the patient's chart and labs.  Questions were answered to the patient's satisfaction.     Sherren Mocha

## 2014-04-22 NOTE — Plan of Care (Signed)
Called to assess patient due to symptomatic bradycardia.  She is short of breath and nauseous.  Her HR is 30 and SBP in 80s and requiring 6L [place].  Patient is on 76mcg of phenylephrine.  Daughter is at bedside.  Plan: Discussed case with Dr. Burt Knack Will plan on temporary transvenous pacer Family updated on plan.  Consent previously obtained.  [name] 20:30

## 2014-04-22 NOTE — Progress Notes (Signed)
Patient seen and examined. Agree with above note. Will increase Dopamine to 7.48mcg/min and see if HR improves.  Will move to CCU and I will notify Dr. Rayann Heman with EP.  She has tachy brady syndrome and will need a PPM

## 2014-04-22 NOTE — Care Management Note (Addendum)
  Page 1 of 1   05/02/2014     3:53:51 PM CARE MANAGEMENT NOTE 05/02/2014  Patient:  [name], [name]   Account Number:  [id number]  Date Initiated:  04/22/2014  Documentation initiated by:  [facility]  Subjective/Objective Assessment:   80 yo F with known hx of Dickson.fib and diastolic CHF here with mild CHF exacerbation.//Home alone.     Action/Plan:   Bedside cardioversion.//Access for [facility] needs   Anticipated DC Date:  04/23/2014   Anticipated DC Plan:  SKILLED NURSING FACILITY  In-house referral  Clinical Social Worker      DC Planning Services  CM consult      Choice offered to / List presented to:             Status of service:  Completed, signed off Medicare Important Message given?  YES (If response is "NO", the following Medicare IM given date fields will be blank) Date Medicare IM given:  04/19/2014 Date Additional Medicare IM given:  05/01/2014  Discharge Disposition:  [place]  Per UR Regulation:  Reviewed for med. necessity/level of care/duration of stay  If discussed at [place] of Stay Meetings, dates discussed:   04/24/2014  04/29/2014  05/01/2014    Comments:  Donella Stade Hutchinson RN, BSN, MSHL, CCM  Nurse - Case Manager,  ([facility])  [phone]  04/30/2014 Discharged to SNF:  [facility] for CHF mgmt and Rehab IM DATES:  6/6, 6/10, 6/15, 6/18,   Crystal Hutchinson RN, BSN, MSHL, CCM  Nurse - Case Manager,  (Unit [place])  [phone]  04/30/2014 PT RECS:  SNF (SW/Donna Crowder active with case) Remains on IV Lasix 40 bid and Oxygen. Additional IM given 04/28/2014

## 2014-04-22 NOTE — Progress Notes (Signed)
Pt still having nausea and vomiting, HR 52 NSR with PVCs. B/P 100/60- 72/40.  Dopamine stopped but pt still c/o nausea and systolic B/P down to 76H. HR 52. Dr [place] present. Plan is for another 250cc NS bolus (approx 750cc so far) and try Neosynepherine  For B/P.     Kerin Ransom PA-C 04/22/2014 5:01 PM

## 2014-04-22 NOTE — Significant Event (Signed)
Rapid Response Event Note  Overview:  Called to assist with patient with symptomatic bradycardia Time Called: 1535 Arrival Time: 1547 Event Type: Cardiac  Initial Focused Assessment:  At time of call unable to current critical patient - assessed patient thru phone conversation with RN Lovette Cliche- she reports pt had cardioversion this AM for AF - now with SB into the 20's - patient is c/o dizziness and generally not feeling well - no CP but pain in "back of head."  She also reports patient is sitting in chair - advised her to place O2, get Atropine to the bedside - get the Zoll to the room and prepare to place the pacing pads - get the patient back to bed and stat page the cardiology group - I would be there as soon as I placed current patient in the ICU.  On my arrival to the bedside - Kerin Ransom PA present - patient is pale - warm - alert - c/o dizziness and weakness - some nausea.  HR is 35 - JR  - BP 100/49.  Denies CP at this point.   Interventions:  Atropine 1/2 amp IV given PTA .  No response.  Pads placed - attempted to pace - not capturing - MA to 50 - uncomfortable to patient.  BP now 76/38.    Dopamine started at 64mcg per order right PIV - new line just started - site ok.  HR to 42.  New PIV left wrist #20 cath - site unremarkable.  Dr. Radford Pax present.  Dopamine increased to 7.5 mcg per order.  HR increasing to 56.  BP 98/45.  C/o increased nausea.  NS bolus started at wide open.  Zofran 4mg  IV given.  Transported to 5F29 via bed with nasal cannula - Dopamine - NS on Zoll monitor.  On arrival to [place] - HR 63 - BP 114/49 - patient now c/o chest pressure - stat 12 lead EKG done. Kerin Ransom present - Dopamine decreased to 74mcg/hr.  Nausea has resolved.  Patient feels less weak.  Handoff to [employer].  Family updated.     Event Summary: Name of Physician Notified: Dr. Radford Pax -  Kerin Ransom PA at 1535    at    Outcome: Transferred (Comment) [phone])  Event End Time: [place]

## 2014-04-22 NOTE — Progress Notes (Signed)
Pt heart rate down in the 20's. Assessed pt. Complained of some diziness, and just not feeling well. Rapid response paged. Md paged.orders given to give 1/2 AMP of atropine given IV as ordered. Pt feeling better as BP and HR has increased, Complains of pain in the back of her head.

## 2014-04-22 NOTE — Progress Notes (Signed)
agree

## 2014-04-22 NOTE — Progress Notes (Signed)
Pt transferred to CCU. HR now 60 but nausea and vomiting, Dopamine cut back to 5 ug/min. EKG shows NSR 60 with PVCs. B/P stable 114/ 78. Will decrease IVF to 74 cc hr.   Kerin Ransom PA-C 04/22/2014 4:31 PM Patient seen and examined and history reviewed. Agree with above findings and plan. I discussed with Kerin Ransom PA-C. Patient with nausea and increased PVCs on dopamine. She has received 500 cc of saline IV this afternoon. HR now in the 50s but she remains hypotensive. Will give additional 250 cc IV saline and start IV neosynephrine for BP support and discontinue IV dopamine as this is aggravating her ectopy and nausea. Lasix, coreg and amiodarone will be held for now. Some of nausea and hypotension may be related to hypovagatonia and  residual from anesthetia earlier.   Peter M Martinique, [place] 04/22/2014 5:17 PM

## 2014-04-22 NOTE — Progress Notes (Signed)
Called to see for symptomatic bradycardia. HR down to low 30s. Pt symptomatic with nausea and weakness. No response to Atropine 1/2 amp and Dopamine 5 ug/min. Dr Radford Pax present. External pacer pads in place but did not capture at 50 MA. Plan is to transfer to CCU. She may need an urgent pacemaker.  Kerin Ransom PA-C 04/22/2014 4:11 PM

## 2014-04-22 NOTE — CV Procedure (Signed)
Bedside Arterial Line Insertion  Indication: shock, symptomatic bradycardia, need for invasive hemodynamic monitoring  Using normal sterile technique, the right wrist was prepped, draped, and anesthetized with 1% lidocaine. Using the modified Seldinger technique, a radial arterial line was inserted without difficulty. The patient tolerated the procedure without immediate complications.  [name] 04/22/2014 10:26 PM

## 2014-04-22 NOTE — Progress Notes (Signed)
    Subjective:  No SOB, still complaining of M/S chest and back pain.  Objective:  Vital Signs in the last 24 hours: Temp:  [97.7 F (36.5 C)-98 F (36.7 C)] 98 F (36.7 C) (06/09 0414) Pulse Rate:  [67-87] 87 (06/09 0414) Resp:  [18-20] 18 (06/09 0414) BP: (89-115)/(46-55) 115/53 mmHg (06/09 0414) SpO2:  [94 %-97 %] 94 % (06/09 0414) Weight:  [68 kg (149 lb 14.6 oz)] 68 kg (149 lb 14.6 oz) (06/09 0414)  Intake/Output from previous day:  Intake/Output Summary (Last 24 hours) at 04/22/14 0850 Last data filed at 04/22/14 2595  Gross per 24 hour  Intake   1260 ml  Output   1550 ml  Net   -290 ml   . amiodarone  200 mg Oral BID  . apixaban  5 mg Oral BID  . aspirin EC  81 mg Oral Daily  . carvedilol  3.125 mg Oral BID WC  . levothyroxine  75 mcg Oral QAC breakfast  . loratadine  10 mg Oral Daily  . simvastatin  20 mg Oral QHS  . sodium chloride  3 mL Intravenous Q12H   Physical Exam: General appearance: alert, cooperative and no distress No JVD Lungs: few crackles Lt base Heart: irregularly irregular rhythm 1/6 sem ABS: BS+ Extremities: no edema, significant varicosities   Rate: 72  Rhythm: atrial fibrillation  Lab Results:  Recent Labs  04/19/14 1722 04/21/14 0104  WBC 6.3 5.7  HGB 12.0 11.8*  PLT 173 167    Recent Labs  04/21/14 0104 04/22/14 0356  NA 141 140  K 4.1 4.3  CL 100 99  CO2 29 30  GLUCOSE 93 93  BUN 28* 27*  CREATININE 1.47* 1.65*    Recent Labs  04/21/14 0104 04/21/14 0638  TROPONINI <0.30 <0.30   No results found for this basename: INR,  in the last 72 hours  Imaging: Imaging results have been reviewed  Cardiac Studies:  Assessment/Plan:  The patient is an 80 year old female patient with a history of history of HTN, HLD, and aortic sclerosis, with PAF and  diastolic heart failure. She was admitted to the hospital with chest pain on 3/15 and underwent cardiac catheterization that showed mild nonobstructive disease and  normal EF. She was sent home on Eliquis and her rate was controlled. She continued to have dyspnea on exertion and heart failure and was readmitted in April. She underwent DC cardioversion on 02/26/14. 2-D echo revealed normal LV function and wall motion. She didn't hold NSR and Amiodarone was added. She presented to the ER with SOB 04/19/14. She is noted to be in AF with CVR.   Principal Problem:   Acute on chronic diastolic CHF (congestive heart failure) Active Problems:   Atrial fibrillation   Hypertension   Chronic renal disease, stage III   Bradycardia, sinus   DDD (degenerative disc disease), thoracolumbar   Hypothyroidism   Coronary disease non obstucted    Chronic anticoagulation   Shortness of breath    PLAN: Cardioversion today 10 am.   Kerin Ransom [facility] [phone] 04/22/2014, 8:50 AM   Patient seen and examined. Agree with assessment and plan. AF with rate in the 70-80's on eliquis, amiodarone and low dose carvedilol. For cardioversion.  Cr increased to 1.65 today.   Troy Sine, MD, [facility] 04/22/2014 9:14 AM

## 2014-04-22 NOTE — Progress Notes (Signed)
Pt transferred to [facility] for further treatment and monitoring. Heart rate in the 50's while being transferred.

## 2014-04-22 NOTE — Progress Notes (Signed)
PT Cancellation Note  Patient Details Name: [name] MRN: [id number] DOB: 03-13-1925   Cancelled Treatment:    Reason Eval/Treat Not Completed: Medical issues which prohibited therapy. Attempted to see again at 1415 and still groggy from cardioversion; pt now with bradycardia and Rapid Response/MD working with pt. Will follow and resume PT/activity on 6/10 if appropriate.   Jeanie Cooks Nevaen Tredway 04/22/2014, 4:10 PM Pager [phone]

## 2014-04-22 NOTE — Progress Notes (Signed)
PT Cancellation Note  Patient Details Name: LAEL WETHERBEE MRN: [id number] DOB: 1925/01/07   Cancelled Treatment:    Reason Eval/Treat Not Completed: Patient prepared to go for cardioversion (per MD note to be at 10:00). She requested to defer PT at this time and agreed PT can follow-up with her later today.   Jeanie Cooks Valeree Leidy 04/22/2014, 9:59 AM Pager [phone]

## 2014-04-22 NOTE — Progress Notes (Signed)
[facility] PROGRESS NOTE  [name]:[id number] DOB: 1925-01-24 DOA: 04/19/2014 PCP: [name], MD  Interim Summary Is a pleasant 80 year old with a past medical history diastolic congestive heart failure, admitted to the medicine service on 04/19/2014 when she presented with complaints of shortness of breath. Initial labs revealed a BNP of 3779 as chest x-ray revealed degree of congestive heart failure without consolidation. She was initially diuresed with IV Lasix 4 acute decompensated congestive heart failure. Transthoracic echocardiogram that was performed on 02/26/2014 showed an ejection fraction of 60-65% without wall motion abnormalities and probable diastolic dysfunction. Patient meanwhile complaining of chest pain, dizziness, shortness of breath. She was found to be hypotensive on 04/21/2014 for which her Coreg dose was decreased and diuretic therapy placed on hold. Because of the difficulties managing volume cardiology was consulted. Cardiology recommending cardioversion as it was felt that controlling volume would be facilitated by keeping her in sinus rhythm. On 04/22/2014 she underwent bedside cardioversion. She tolerated procedure well.                                   .  Assessment/Plan: 1. Acute diastolic congestive heart failure. -Transthoracic echocardiogram performed on 02/26/2014 showing ejection fraction of 60-65% without wall motion abnormalities -She presents with clinical signs symptoms suggestive of acute CHF, with initial lab showed an elevated BNP of 3779 -She initially received 40 mg of IV Lasix. -Diuretics currently held do to hypotension  2.  Hypotension -Likely secondary to antihypertensive agents and diuresis -Will continue holding lasix for now as she appears to be compensated.  3. Acute kidney injury. -Patient's creatinine continues to trend up from 1.47 on 04/21/2014 to 1.65 on 04/22/2014 -Diuretic therapy held -Repeat labs in AM.    4. Bradycardia -Heart rates in the 40's to 50's, coreg stopped.   5. Chest Pain -Overnight patient complaining of chest pain -Had cath in march of 2015 which showed nonobstructive CAD. Cardiac enzymes were cycled and remained negative.  -Patient today reporting resolution to her CP, I suspect musculoskeletal in origin  6.  Atrial fibrillation -S/P cardioversion on 04/22/2014 -Continue amiodarone, coreg discontinued due to development of bradycardia.  -Remains anticoagulated  7. Anticoagulation -Continue apixaban, which was decreased to 2.5 mg for renal adjustment   Code Status: Full Family Communication:  Disposition Plan: Cardiology consulted    HPI/Subjective:   Objective: Filed Vitals:   04/22/14 0414  BP: 115/53  Pulse: 87  Temp: 98 F (36.7 C)  Resp: 18    Intake/Output Summary (Last 24 hours) at 04/22/14 1441 Last data filed at 04/22/14 0941  Gross per 24 hour  Intake    360 ml  Output   1500 ml  Net  -1140 ml   Filed Weights   04/19/14 2338 04/21/14 0701 04/22/14 0414  Weight: 68.493 kg (151 lb) 68.21 kg (150 lb 6 oz) 68 kg (149 lb 14.6 oz)    Exam:   General:  Patient reports having some dizziness and nausea, otherwise in no acute distress awake alert oriented x3  Cardiovascular: 2-6 systolic ejection murmur  Respiratory: Clear to auscultation and a few by basilar crackles normal respiratory effort  Abdomen: Soft nontender nondistended  Musculoskeletal: No edema   Data Reviewed: Basic Metabolic Panel:  Recent Labs Lab 04/19/14 1722 04/20/14 0408 04/21/14 0104 04/22/14 0356  NA 141 143 141 140  K 4.6 3.9 4.1 4.3  CL 104 103 100 99  CO2 23 27 29 30   GLUCOSE 109* 109* 93 93  BUN 28* 26* 28* 27*  CREATININE 1.39* 1.43* 1.47* 1.65*  CALCIUM 10.0 9.6 9.7 9.5   Liver Function Tests: No results found for this basename: AST, ALT, ALKPHOS, BILITOT, PROT, ALBUMIN,  in the last 168 hours No results found for this basename: LIPASE,  AMYLASE,  in the last 168 hours No results found for this basename: AMMONIA,  in the last 168 hours CBC:  Recent Labs Lab 04/19/14 1722 04/21/14 0104  WBC 6.3 5.7  HGB 12.0 11.8*  HCT 37.9 37.2  MCV 89.2 88.8  PLT 173 167   Cardiac Enzymes:  Recent Labs Lab 04/20/14 0408 04/20/14 1049 04/20/14 2030 04/21/14 0104 04/21/14 0638  TROPONINI <0.30 <0.30 <0.30 <0.30 <0.30   BNP (last 3 results)  Recent Labs  04/13/14 1646 04/19/14 1722 04/20/14 0408  PROBNP 4319.0* 3779.0* 3528.0*   CBG: No results found for this basename: GLUCAP,  in the last 168 hours  No results found for this or any previous visit (from the past 240 hour(s)).   Studies: No results found.  Scheduled Meds: . amiodarone  200 mg Oral BID  . apixaban  2.5 mg Oral BID  . aspirin EC  81 mg Oral Daily  . levothyroxine  75 mcg Oral QAC breakfast  . loratadine  10 mg Oral Daily  . simvastatin  20 mg Oral QHS  . sodium chloride  3 mL Intravenous Q12H   Continuous Infusions:   Principal Problem:   Acute on chronic diastolic CHF (congestive heart failure) Active Problems:   Shortness of breath   Bradycardia, sinus   DDD (degenerative disc disease), thoracolumbar   Hypothyroidism   Atrial fibrillation   Coronary disease non obstucted    Hypertension   Chronic anticoagulation   Chronic renal disease, stage III    Time spent: 35 min    [facility] Pager [phone]. If 7PM-7AM, please contact night-coverage at [email or web address], password [facility] 04/22/2014, 2:41 PM  LOS: 3 days

## 2014-04-22 NOTE — Progress Notes (Signed)
Orthopedic Tech Progress Note Patient Details:  [name] 1925-07-03 [id number]  Ortho Devices Type of Ortho Device: Knee Immobilizer Ortho Device/Splint Interventions: Ordered   Braulio Bosch 04/22/2014, 10:55 PM

## 2014-04-22 NOTE — Progress Notes (Signed)
MD paged about pt's bradycardia.

## 2014-04-22 NOTE — Transfer of Care (Signed)
Immediate Anesthesia Transfer of Care Note  Patient: [name]  Procedure(s) Performed: Procedure(s): CARDIOVERSION (BEDSIDE) (N/A)  Patient Location: PACU  Anesthesia Type:MAC  Level of Consciousness: awake, alert  and oriented  Airway & Oxygen Therapy: Patient Spontanous Breathing and Patient connected to nasal cannula oxygen  Post-op Assessment: Report given to PACU RN and Post -op Vital signs reviewed and stable  Post vital signs: Reviewed and stable  Complications: No apparent anesthesia complications

## 2014-04-22 NOTE — Progress Notes (Signed)
Rapid response in the room working with pt. Md also present in room.Spoke with patients son in law to notifiy of condition.

## 2014-04-22 NOTE — Progress Notes (Signed)
Called by Dr Colon Flattery to see patient. The patient is 80 years old with paroxysmal atrial fibrillation and diastolic heart failure. She was cardioverted this morning and has had marked bradycardia since cardioversion. This is been associated with hypotension. She was initially treated with dopamine but was intolerant with nausea and vomiting. She has been given IV fluids to support blood pressure. Later this afternoon she was changed over to Neo-Synephrine. I was asked to evaluate her late this evening for consideration of a temporary transvenous pacemaker. However, at that time she had responded well to IV fluids and was hemodynamically stable. I felt it was best to wait for permanent pacemaker tomorrow as long as she remains stable. Over the last hour she has had progressive shortness of breath and persistent hypotension with heart rate in the low 30s. After discussion with Dr Colon Flattery, we agreed it was best to proceed with a temporary pacemaker. The patient has been fully anticoagulated with pradaxa. Risks, indications, and alternatives have been discussed extensively with the patient's family. Her daughter is present and at the bedside.  Sherren Mocha 04/22/2014 9:07 PM

## 2014-04-22 NOTE — Progress Notes (Signed)
Called by RN- HR down in high 30's. Pt "tired" but otherwise tolerating well. Coreg has been stopped, will hold Amiodarone tonight, decrease to one a day in AM if HR > 50. Move to stepdown if HR continues to drop or pt becomes more symptomatic.   Kerin Ransom PA-C 04/22/2014 3:07 PM

## 2014-04-22 NOTE — Anesthesia Preprocedure Evaluation (Signed)
Anesthesia Evaluation    Airway        Dental   Pulmonary          Cardiovascular hypertension,     Neuro/Psych    GI/Hepatic   Endo/Other    Renal/GU      Musculoskeletal   Abdominal   Peds  Hematology   Anesthesia Other Findings   Reproductive/Obstetrics                             Anesthesia Physical Anesthesia Plan  ASA: III  Anesthesia Plan:    Post-op Pain Management:    Induction:   Airway Management Planned:   Additional Equipment:   Intra-op Plan:   Post-operative Plan:   Informed Consent:   Plan Discussed with:   Anesthesia Plan Comments:         Anesthesia Quick Evaluation  

## 2014-04-22 NOTE — Anesthesia Postprocedure Evaluation (Signed)
  Anesthesia Post-op Note  Patient: [name]  Procedure(s) Performed: Procedure(s): CARDIOVERSION (BEDSIDE) (N/A)  Patient Location:  3 [place]  Anesthesia Type:General  Level of Consciousness: awake, alert  and oriented  Airway and Oxygen Therapy: Patient Spontanous Breathing and Patient connected to nasal cannula oxygen  Post-op Pain: none  Post-op Assessment: Post-op Vital signs reviewed, Patient's Cardiovascular Status Stable, Respiratory Function Stable, Patent Airway and Pain level controlled  Post-op Vital Signs: stable  Last Vitals:  Filed Vitals:   04/22/14 0414  BP: 115/53  Pulse: 87  Temp: 36.7 C  Resp: 18    Complications: No apparent anesthesia complications

## 2014-04-22 NOTE — Interval H&P Note (Signed)
History and Physical Interval Note:  04/22/2014 11:42 AM  [name]  has presented today for surgery, with the diagnosis of AFIB  The various methods of treatment have been discussed with the patient and family. After consideration of risks, benefits and other options for treatment, the patient has consented to  Procedure(s): CARDIOVERSION (BEDSIDE) (N/A) as a surgical intervention .  The patient's history has been reviewed, patient examined, no change in status, stable for surgery.  I have reviewed the patient's chart and labs.  Questions were answered to the patient's satisfaction.     [name]

## 2014-04-22 NOTE — Progress Notes (Signed)
No acute events happened overnight. Pt has been on NPO since midnight.

## 2014-04-22 NOTE — CV Procedure (Signed)
   Cardiac Catheterization Procedure Note  Name: [name] MRN: [id number] DOB: 05-28-1925  Procedure: Temporary transvenous pacemaker placement under fluoroscopic guidance  Indication: Symptomatic bradycardia with hypotension  Procedural details: The right groin was prepped, draped, and anesthetized with 1% lidocaine. Using modified Seldinger technique, a 6 French sheath was introduced into the right femoral vein. Under fluoroscopic guidance, a balloon temporary transvenous pacing wire was advanced into the right ventricular apex. Pacing thresholds were tested. The wire was repositioned to obtain a pacing threshold less than 1 mA. The pacemaker was set at a rate of 80 beats per minute and output of 5 mA. The patient tolerated the procedure well and there were no immediate complications.  Final Conclusions:  Successful transvenous pacemaker placement  Sherren Mocha 04/22/2014, 9:28 PM

## 2014-04-22 NOTE — Progress Notes (Signed)
Agree with above note

## 2014-04-23 ENCOUNTER — Encounter (HOSPITAL_COMMUNITY)
Admission: EM | Disposition: A | Discharge status: Skilled Nursing Facility | Payer: Self-pay | Source: Home / Self Care | Attending: Internal Medicine

## 2014-04-23 ENCOUNTER — Inpatient Hospital Stay (HOSPITAL_COMMUNITY): Payer: Medicare Other

## 2014-04-23 DIAGNOSIS — I495 Sick sinus syndrome: Secondary | ICD-10-CM

## 2014-04-23 DIAGNOSIS — K219 Gastro-esophageal reflux disease without esophagitis: Secondary | ICD-10-CM

## 2014-04-23 DIAGNOSIS — I509 Heart failure, unspecified: Secondary | ICD-10-CM | POA: Diagnosis present

## 2014-04-23 DIAGNOSIS — I498 Other specified cardiac arrhythmias: Secondary | ICD-10-CM

## 2014-04-23 HISTORY — PX: PERMANENT PACEMAKER INSERTION: SHX5480

## 2014-04-23 HISTORY — PX: PACEMAKER INSERTION: SHX728

## 2014-04-23 LAB — URINALYSIS, ROUTINE W REFLEX MICROSCOPIC
GLUCOSE, UA: NEGATIVE mg/dL
Hgb urine dipstick: NEGATIVE
Ketones, ur: NEGATIVE mg/dL
Nitrite: NEGATIVE
Protein, ur: NEGATIVE mg/dL
Specific Gravity, Urine: 1.024 (ref 1.005–1.030)
UROBILINOGEN UA: 1 mg/dL (ref 0.0–1.0)
pH: 5.5 (ref 5.0–8.0)

## 2014-04-23 LAB — CBC
HCT: 37.1 % (ref 36.0–46.0)
HCT: 36 % (ref 36.0–46.0)
Hemoglobin: 11.6 g/dL — ABNORMAL LOW (ref 12.0–15.0)
Hemoglobin: 11.3 g/dL — ABNORMAL LOW (ref 12.0–15.0)
MCH: 28.4 pg (ref 26.0–34.0)
MCH: 28 pg (ref 26.0–34.0)
MCHC: 31.3 g/dL (ref 30.0–36.0)
MCHC: 31.4 g/dL (ref 30.0–36.0)
MCV: 90.7 fL (ref 78.0–100.0)
MCV: 89.3 fL (ref 78.0–100.0)
PLATELETS: 161 10*3/uL (ref 150–400)
Platelets: 143 10*3/uL — ABNORMAL LOW (ref 150–400)
RBC: 4.09 MIL/uL (ref 3.87–5.11)
RBC: 4.03 MIL/uL (ref 3.87–5.11)
RDW: 14.9 % (ref 11.5–15.5)
RDW: 14.7 % (ref 11.5–15.5)
WBC: 5.3 10*3/uL (ref 4.0–10.5)
WBC: 7.4 10*3/uL (ref 4.0–10.5)

## 2014-04-23 LAB — BASIC METABOLIC PANEL
BUN: 27 mg/dL — ABNORMAL HIGH (ref 6–23)
CO2: 23 mEq/L (ref 19–32)
Calcium: 9.1 mg/dL (ref 8.4–10.5)
Chloride: 101 mEq/L (ref 96–112)
Creatinine, Ser: 1.43 mg/dL — ABNORMAL HIGH (ref 0.50–1.10)
GFR calc Af Amer: 37 mL/min — ABNORMAL LOW (ref >90)
GFR calc non Af Amer: 32 mL/min — ABNORMAL LOW (ref >90)
Glucose, Bld: 125 mg/dL — ABNORMAL HIGH (ref 70–99)
Potassium: 4.5 mEq/L (ref 3.7–5.3)
Sodium: 138 mEq/L (ref 137–147)

## 2014-04-23 LAB — URINE MICROSCOPIC-ADD ON

## 2014-04-23 LAB — PROTIME-INR
INR: 1.64 — ABNORMAL HIGH (ref 0.00–1.49)
Prothrombin Time: 19 seconds — ABNORMAL HIGH (ref 11.6–15.2)

## 2014-04-23 LAB — ABO/RH: ABO/RH(D): B POS

## 2014-04-23 LAB — MRSA PCR SCREENING: MRSA by PCR: NEGATIVE

## 2014-04-23 LAB — APTT: aPTT: 42 seconds — ABNORMAL HIGH (ref 24–37)

## 2014-04-23 SURGERY — PERMANENT PACEMAKER INSERTION
Anesthesia: LOCAL

## 2014-04-23 MED ORDER — HEPARIN (PORCINE) IN NACL 2-0.9 UNIT/ML-% IJ SOLN
INTRAMUSCULAR | Status: AC
  Filled 2014-04-23: qty 500

## 2014-04-23 MED ORDER — ACETAMINOPHEN 325 MG PO TABS
325.0000 mg | ORAL_TABLET | ORAL | Status: DC | PRN
  Administered 2014-04-24 (×2): 650 mg via ORAL
  Administered 2014-04-24: 325 mg via ORAL
  Administered 2014-04-24 – 2014-05-02 (×12): 650 mg via ORAL
  Filled 2014-04-23 (×16): qty 2

## 2014-04-23 MED ORDER — CEFAZOLIN SODIUM 1-5 GM-% IV SOLN
1.0000 g | Freq: Four times a day (QID) | INTRAVENOUS | Status: AC
  Administered 2014-04-23 – 2014-04-24 (×3): 1 g via INTRAVENOUS
  Filled 2014-04-23 (×3): qty 50

## 2014-04-23 MED ORDER — CHLORHEXIDINE GLUCONATE 4 % EX LIQD
60.0000 mL | Freq: Once | CUTANEOUS | Status: DC
  Filled 2014-04-23: qty 15

## 2014-04-23 MED ORDER — CEFAZOLIN SODIUM-DEXTROSE 2-3 GM-% IV SOLR
2.0000 g | INTRAVENOUS | Status: DC
  Filled 2014-04-23: qty 50

## 2014-04-23 MED ORDER — MIDAZOLAM HCL 5 MG/5ML IJ SOLN
INTRAMUSCULAR | Status: AC
  Filled 2014-04-23: qty 5

## 2014-04-23 MED ORDER — LIDOCAINE HCL (PF) 1 % IJ SOLN
INTRAMUSCULAR | Status: DC
Start: 2014-04-23 — End: 2014-04-23
  Filled 2014-04-23: qty 30

## 2014-04-23 MED ORDER — LIDOCAINE HCL (PF) 1 % IJ SOLN
INTRAMUSCULAR | Status: AC
  Filled 2014-04-23: qty 30

## 2014-04-23 MED ORDER — ONDANSETRON HCL 4 MG/2ML IJ SOLN
4.0000 mg | Freq: Four times a day (QID) | INTRAMUSCULAR | Status: DC | PRN
  Administered 2014-04-23 – 2014-05-01 (×3): 4 mg via INTRAVENOUS
  Filled 2014-04-23 (×3): qty 2

## 2014-04-23 MED ORDER — CHLORHEXIDINE GLUCONATE 4 % EX LIQD
60.0000 mL | Freq: Once | CUTANEOUS | Status: AC
Start: 2014-04-23 — End: 2014-04-23
  Administered 2014-04-23: 4 via TOPICAL
  Filled 2014-04-23: qty 15

## 2014-04-23 MED ORDER — SODIUM CHLORIDE 0.9 % IV SOLN
INTRAVENOUS | Status: DC
  Administered 2014-04-23: 09:00:00 via INTRAVENOUS

## 2014-04-23 MED ORDER — FENTANYL CITRATE 0.05 MG/ML IJ SOLN
INTRAMUSCULAR | Status: AC
  Filled 2014-04-23: qty 2

## 2014-04-23 MED ORDER — SODIUM CHLORIDE 0.9 % IR SOLN
80.0000 mg | Status: DC
  Filled 2014-04-23: qty 2

## 2014-04-23 MED ORDER — MIDAZOLAM HCL 5 MG/5ML IJ SOLN
INTRAMUSCULAR | Status: AC
Start: 2014-04-23 — End: 2014-04-23
  Filled 2014-04-23: qty 5

## 2014-04-23 MED ORDER — SODIUM CHLORIDE 0.9 % IR SOLN
80.0000 mg | Status: DC
  Filled 2014-04-23 (×2): qty 2

## 2014-04-23 NOTE — Consult Note (Signed)
ELECTROPHYSIOLOGY CONSULT NOTE   Patient ID: KORINNA TAT MRN: [id number], DOB/AGE: Jan 13, 1925   Admit date: 04/19/2014 Date of Consult: 04/23/2014  Primary Physician: [name], MD Primary Cardiologist: Harrington Challenger, MD Reason for Consultation: Tachy-brady syndrome  History of Present Illness [name] is an 80 y.o. female with nonobstructive CAD s/p recent cardiac cath March 2015, atrial fibrillation, HTN and chronic diastolic HF who was admitted on 04/19/2014 with SOB, found to have recurrent atrial fibrillation and acute on chronic diastolic HF. She was continued on amiodarone (started 03/12/2014), rate control and anticoagulation. She underwent DCCV yesterday which was successful for restoring SR. However, she was bradycardic in the low 50s and Coreg was discontinued. Overnight she became profoundly bradycardic and hypotensive post DCCV with symptoms of dizziness, nausea and vomiting, requiring pressor support and temporary pacing wire placement. EP has been asked to see and provide recommendations regarding PPM.  Past Medical History Past Medical History  Diagnosis Date  . Dyslipidemia   . Hypertension   . Palpitations     PVCs/bigeminy on event in May 2009 revealing this was relatively asymptomatic  . Chest pain     a. 2011 Neg MV;  b. 01/2014 Cath: LM 20-30, LAD nl, D1 nl, LCX nl, OM1 nl, RCA dom 30-68m, PD/PL nl, EF 65%->Med Rx.  . Degenerative joint disease   . Hx of varicella   . History of skin cancer     tafeen/ non melanoma.   . Thrombocytopenia   . Anemia   . Monoclonal gammopathies   . Right lower quadrant abdominal pain 07/19/2012    Recurrent  With nausea    This is new since she had ct for llq pain in [facility]    R/o appendiceal problem  Hernia  Get ct scan  And plan  Fu     . Diastolic CHF, acute on chronic 01/31/2012  . Atrial fibrillation     a. Dx 01/2014->Eliquis started.    Past Surgical History Past Surgical History  Procedure Laterality Date    . Cholecystectomy  1999  . Cesarean section      times 2  . Shoulder surgery  1996    Right   . Cataract extraction      Bilateral  implantt  . Cardioversion N/A 02/26/2014    Procedure: CARDIOVERSION AT BEDSIDE;  Surgeon: Pixie Casino, MD;  Location: [facility] OR;  Service: Cardiovascular;  Laterality: N/A;    Allergies/Intolerances No Known Allergies  Current Home Medications      amiodarone 200 MG tablet  Commonly known as:  PACERONE  Take 200 mg by mouth 2 (two) times daily.     apixaban 5 MG Tabs tablet  Commonly known as:  ELIQUIS  Take 1 tablet (5 mg total) by mouth 2 (two) times daily.     aspirin 81 MG EC tablet  Take 1 tablet (81 mg total) by mouth daily.     CALCIUM PO  Take 1 tablet by mouth daily.     carvedilol 12.5 MG tablet  Commonly known as:  COREG  Take 1 tablet (12.5 mg total) by mouth 2 (two) times daily with a meal.     furosemide 40 MG tablet  Commonly known as:  LASIX  Take 0.5 tablets (20 mg total) by mouth daily.     HYDROcodone-acetaminophen 5-325 MG per tablet  Commonly known as:  NORCO/VICODIN  Take 1 tablet every 4-6 hours as needed for pain.     levothyroxine 75 MCG tablet  Commonly known as:  SYNTHROID, LEVOTHROID  Take 1 tablet (75 mcg total) by mouth daily.     loratadine 10 MG tablet  Commonly known as:  CLARITIN  Take 10 mg by mouth daily.     nitroGLYCERIN 0.4 MG SL tablet  Commonly known as:  NITROSTAT  Place 1 tablet (0.4 mg total) under the tongue every 5 (five) minutes as needed. For chest pain     simvastatin 20 MG tablet  Commonly known as:  ZOCOR  Take 1 tablet (20 mg total) by mouth at bedtime.     traMADol 50 MG tablet  Commonly known as:  ULTRAM  Take 50 mg by mouth every 6 (six) hours as needed (pain).     Inpatient Medications . amiodarone  200 mg Oral Daily  . apixaban  2.5 mg Oral BID  . aspirin EC  81 mg Oral Daily  . levothyroxine  75 mcg Oral QAC breakfast  . loratadine  10 mg Oral Daily  .  simvastatin  20 mg Oral QHS  . sodium chloride  3 mL Intravenous Q12H   . sodium chloride Stopped (04/22/14 2200)  . DOPamine Stopped (04/22/14 1655)  . phenylephrine (NEO-SYNEPHRINE) Adult infusion Stopped (04/22/14 2200)    Family History Family History  Problem Relation Age of Onset  . Coronary artery disease Father   . Heart disease Father   . Lung cancer    . Alzheimer's disease Mother   . Cancer Brother 97    lung cancer     Social History Social History  . Marital Status: Widowed    Spouse Name: N/A    Number of Children: 3  . Years of Education: N/A   Occupational History  .      [employer], book keeping   Social History Main Topics  . Smoking status: Never Smoker   . Smokeless tobacco: Never Used  . Alcohol Use: No  . Drug Use: No   Review of Systems General: No chills, fever, night sweats or weight changes  Cardiovascular:  No chest pain, dyspnea on exertion, edema, orthopnea, palpitations, paroxysmal nocturnal dyspnea Dermatological: No rash, lesions or masses Respiratory: No cough, dyspnea Urologic: No hematuria, dysuria Abdominal: No nausea, vomiting, diarrhea, bright red blood per rectum, melena, or hematemesis Neurologic: No visual changes, weakness, changes in mental status All other systems reviewed and are otherwise negative except as noted above.  Physical Exam Vitals: Blood pressure 114/61, pulse 81, temperature 97.5 F (36.4 C), temperature source Oral, resp. rate 17, height 4\' 8"  (1.422 m), weight 158 lb 11.7 oz (72 kg), SpO2 88.00%.  General: Well developed, elderly  80 y.o. female in no acute distress. HEENT: Normocephalic, atraumatic. EOMs intact. Sclera nonicteric. Oropharynx clear.  Neck: Supple. No JVD. Lungs: Respirations regular and unlabored, CTA bilaterally. No wheezes, rales or rhonchi. Heart: Regular. S1, S2 present. II/VI systolic murmur at apex. No rub, S3 or S4. Abdomen: Soft, non-tender, non-distended. BS present x 4  quadrants.  Extremities: No clubbing, cyanosis or edema. DP/PT/Radials 2+ and equal bilaterally. Temp TVP in right groin. Psych: Normal affect. Neuro: Alert and oriented X 3. Moves all extremities spontaneously. Musculoskeletal: No kyphosis. Skin: Intact. Warm and dry. No rashes or petechiae in exposed areas.   Labs  Recent Labs  04/20/14 1049 04/20/14 2030 04/21/14 0104 04/21/14 0638  TROPONINI <0.30 <0.30 <0.30 <0.30   Lab Results  Component Value Date   WBC 5.3 04/23/2014   HGB 11.6* 04/23/2014   HCT 37.1 04/23/2014  MCV 90.7 04/23/2014   PLT 143* 04/23/2014    Recent Labs Lab 04/23/14 0423  NA 138  K 4.5  CL 101  CO2 23  BUN 27*  CREATININE 1.43*  CALCIUM 9.1  GLUCOSE 125*    Radiology/Studies Dg Chest 2 View  04/19/2014   CLINICAL DATA:  Shortness of breath and wheezing  EXAM: CHEST  2 VIEW  COMPARISON:  Apr 13, 2014  FINDINGS: There remains cardiomegaly mild pulmonary venous hypertension. There is a mild degree of interstitial edema, minimally less than on recent prior study. There is no airspace consolidation. No new opacity. No adenopathy. There is thoracolumbar levoscoliosis. There is postoperative change in the right shoulder.  IMPRESSION: Evidence of a degree of congestive heart failure with marginally less edema compared to recent prior study. No airspace consolidation.   Electronically Signed   By: Lowella Grip M.D.   On: 04/19/2014 19:03   Dg Chest 2 View  04/13/2014   CLINICAL DATA:  Short of breath.  EXAM: CHEST  2 VIEW  COMPARISON:  02/24/2014  FINDINGS: Cardiac silhouette is mildly enlarged. Normal mediastinal contours. No hilar masses.  There are irregularly thickened interstitial markings bilaterally similar to the prior exam. No focal lung consolidation. No pleural effusion or pneumothorax.  The bony thorax is demineralized but grossly intact.  IMPRESSION: Cardiomegaly with diffuse bilateral interstitial thickening suggests congestive heart failure. No  focal consolidation to suggest lobar pneumonia.   Electronically Signed   By: Lajean Manes M.D.   On: 04/13/2014 17:06    Echocardiogram April 2015  Study Conclusions - Left ventricle: The cavity size was normal. Wall thickness was increased in a pattern of mild LVH. There was focal basal hypertrophy. Systolic function was normal. The estimated ejection fraction was in the range of 60% to 65%. Wall motion was normal; there were no regional wall motion abnormalities. - Left atrium: The atrium was mildly dilated. - Right atrium: The atrium was mildly dilated. - Pericardium, extracardiac: A trivial pericardial effusion was identified.  12-lead ECG on admission - atrial fibrillation with V rate 98 bpm 12-lead ECG post DCCV 04/22/2014 - sinus bradycardia at 37 bpm with first degree AV block Telemetry - V paced at 80 bpm currently; underlying rhythm - profound sinus bradycardia at 38 bpm; had junctional bradycardia competing with sinus brady prior to temp TVP being placed yesterday  Assessment and Plan Tachy-brady syndrome Sinus node dysfunction Atrial fibrillation  [name] has profound sinus bradycardia. She also requires rate controlling medications for AFib w/RVR. Therefore, she will need PPM implantation for treatment of bradycardia, to allow for continuation of rate controlling medications. Risks, benefits and alternatives to PPM implantation were discussed in detail with [name] today. These risks include, but are not limited to, bleeding, infection, pneumothorax, perforation, tamponade, vascular damage, renal failure, MI, stroke, death, and lead dislodgement. We spoke with both daughters, Mliss Sax and Pamala Hurry. She and her daughters expressed verbal understanding and agree to proceed with PPM implantation.  Signed, Ileene Hutchinson, PA-C 04/23/2014, 7:13 AM  EP Attending  Patient seen and examined. Agree with above. She has symptomatic atrial fibrillation with a RVR/CVR  and underwent DCCV and had profound bradycardia requiring temporary and emergent pacing. She has been on medical therapy for her atrial fib which is contributing to her sinus node dysfunction but she also has symptomatic rapid atrial fib with an RVR. With all of above findings, I have recommended insertion of a DDD PM. The risks/benefits/goals/expectations of the procedure have been  discussed with the patient and she wishes to proceed.  Mikle Bosworth.D.

## 2014-04-23 NOTE — Progress Notes (Addendum)
[facility] PROGRESS NOTE  [name] KGU:[id number] DOB: [birth year] DOA: 04/19/2014 PCP: [name], MD  Assessment/Plan: 1. Acute diastolic congestive heart failure. -Transthoracic echocardiogram performed on 02/26/2014 showing ejection fraction of 60-65% without wall motion abnormalities -She presents with clinical signs symptoms suggestive of acute CHF, with initial lab showed an elevated BNP of 3779 - change to 40 mg by mouth daily this morning. - She diuresed 1,600 mL 6/8; She initially received 40 mg of IV Lasix, was chaned Lasix 40 mg PO q daily>> had episode of hypotension 6/9>> diuretics were then held 6/9 d/t hypotension as discussed below, cardiology to follow and resume when appropriate>> follow. -Patient remains on amiodarone, follow.  2.  Dizziness -Patient reporting dizziness today likely resulting from hypotension and bradycardia>> see treatment as discussed below  3.  Hypotension -Likely secondary to antihypertensive agents and diuresis -Systolic blood pressures in the 70's to 90's this morning and 6/9, likely explaining her dizziness.  -Coreg DC'd on 6/9 d/t hypotension and bradycardia -Patient required vasopressors on 6/9>> initially dopamine, changed to Neo-Synephrine (d/t persistent nausea vomiting) -Resolved status post temporal pacer initiated last p.m. per cards, and vasopressors discontinued -Follow.  4. Chest Pain -Overnight 6/8-9 patient complaining of chest pain  -Had cath in march of 2015 which showed nonobstructive CAD. Cardiac enzymes were cycled and remained negative.  -Per cards  5.  Atrial fibrillation with symptomatic bradycardia /sick sinus syndrome -P.m. of 6/9 patient became bradycardic down to the 30s >> Coreg was discontinued and amiodarone held. -On followup per cards last p.m. patient became symptomatic>> transvenous pacer was placed 6/9 -Permanent pacemaker placed today 6/10 per cardiology -Patient remains on  amiodarone, follow.  6. Anticoagulation -Continue apixaban  7. Hypertension -Antihypertensives discontinued on 6/9 d/t hypotension as above -BP stable today, follow. Code Status: Full Family Communication:  Disposition Plan: Cardiology consulted  Procedures -Temporal transvenous pacemaker placement on 6/9 per Dr. Burt Knack -Per renal and pacemaker placement 6/10 Consultants  -Cardiology  HPI/Subjective: S/p pacemaker, denies n/v and no sob.  Objective: Filed Vitals:   04/23/14 1100  BP:   Pulse: 79  Temp:   Resp: 22    Intake/Output Summary (Last 24 hours) at 04/23/14 1219 Last data filed at 04/23/14 1100  Gross per 24 hour  Intake 1450.33 ml  Output    220 ml  Net 1230.33 ml   Filed Weights   04/21/14 0701 04/22/14 0414 04/23/14 0300  Weight: 68.21 kg (150 lb 6 oz) 68 kg (149 lb 14.6 oz) 72 kg (158 lb 11.7 oz)    Exam:   General:  s/p permanent pacemaker in no acute distress awake alert oriented x3  Cardiovascular: 2-6 systolic ejection murmur. Pressure dressing on chest clean and dry   Respiratory: Clear to auscultation and a few by basilar crackles normal respiratory effort  Abdomen: Soft nontender nondistended  Musculoskeletal: No edema   Data Reviewed: Basic Metabolic Panel:  Recent Labs Lab 04/19/14 1722 04/20/14 0408 04/21/14 0104 04/22/14 0356 04/23/14 0423  NA 141 143 141 140 138  K 4.6 3.9 4.1 4.3 4.5  CL 104 103 100 99 101  CO2 23 27 29 30 23   GLUCOSE 109* 109* 93 93 125*  BUN 28* 26* 28* 27* 27*  CREATININE 1.39* 1.43* 1.47* 1.65* 1.43*  CALCIUM 10.0 9.6 9.7 9.5 9.1   Liver Function Tests: No results found for this basename: AST, ALT, ALKPHOS, BILITOT, PROT, ALBUMIN,  in the last 168 hours No results found for this basename: LIPASE, AMYLASE,  in the last 168 hours No results found for this basename: AMMONIA,  in the last 168 hours CBC:  Recent Labs Lab 04/19/14 1722 04/21/14 0104 04/23/14 0423  WBC 6.3 5.7 5.3  HGB 12.0  11.8* 11.6*  HCT 37.9 37.2 37.1  MCV 89.2 88.8 90.7  PLT 173 167 143*   Cardiac Enzymes:  Recent Labs Lab 04/20/14 0408 04/20/14 1049 04/20/14 2030 04/21/14 0104 04/21/14 0638  TROPONINI <0.30 <0.30 <0.30 <0.30 <0.30   BNP (last 3 results)  Recent Labs  04/13/14 1646 04/19/14 1722 04/20/14 0408  PROBNP 4319.0* 3779.0* 3528.0*   CBG: No results found for this basename: GLUCAP,  in the last 168 hours  Recent Results (from the past 240 hour(s))  MRSA PCR SCREENING     Status: None   Collection Time    04/22/14  4:45 PM      Result Value Ref Range Status   MRSA by PCR NEGATIVE  NEGATIVE Final   Comment:            The GeneXpert MRSA Assay (FDA     approved for NASAL specimens     only), is one component of a     comprehensive MRSA colonization     surveillance program. It is not     intended to diagnose MRSA     infection nor to guide or     monitor treatment for     MRSA infections.     Studies: No results found.  Scheduled Meds: . amiodarone  200 mg Oral Daily  . apixaban  2.5 mg Oral BID  . aspirin EC  81 mg Oral Daily  .  ceFAZolin (ANCEF) IV  2 g Intravenous On Call  . chlorhexidine  60 mL Topical Once  . gentamicin irrigation  80 mg Irrigation On Call  . levothyroxine  75 mcg Oral QAC breakfast  . loratadine  10 mg Oral Daily  . simvastatin  20 mg Oral QHS  . sodium chloride  3 mL Intravenous Q12H   Continuous Infusions: . sodium chloride Stopped (04/22/14 2200)  . sodium chloride 50 mL/hr at 04/23/14 0918  . sodium chloride 50 mL/hr at 04/23/14 0918  . DOPamine Stopped (04/22/14 1655)  . phenylephrine (NEO-SYNEPHRINE) Adult infusion Stopped (04/22/14 2200)    Principal Problem:   Acute on chronic diastolic CHF (congestive heart failure) Active Problems:   Shortness of breath   Bradycardia, sinus   DDD (degenerative disc disease), thoracolumbar   Hypothyroidism   Atrial fibrillation   Coronary disease non obstucted    Hypertension    Chronic anticoagulation   Chronic renal disease, stage III   Acute exacerbation of CHF (congestive heart failure)    Time spent: 35 min    [facility] Pager [phone]. If 7PM-7AM, please contact night-coverage at [email or web address], password [facility] 04/23/2014, 12:19 PM  LOS: 4 days

## 2014-04-23 NOTE — CV Procedure (Signed)
Electrophysiology procedure note  Procedure: Very difficult insertion of a dual-chamber pacemaker in a patient with symptomatic sinus node dysfunction  Introduction: The patient is an 80 year old woman with a history of atrial fibrillation and a rapid ventricular response, who presented to the hospital with the above problem and underwent cardioversion. She had profound bradycardia post procedure and developed asystole requiring a temporary pacemaker insertion. While the patient was on medical therapy, she also had rapid atrial fibrillation and required this medication therapy for her A. fib. She is now referred for pacemaker insertion.   Description of procedure: After informed consent was obtained, the patient was taken to the diagnostic electrophysiology laboratory in the fasting state. After the usual preparation and draping, intravenous Versed and fentanyl were used for sedation. 30 cc of lidocaine was infiltrated into the left infraclavicular region. A 5 cm incision was carried out. Electrocautery was utilized to dissect out the fascial plane. Initial attempts to puncture the left subclavian vein were unsuccessful. 10 cc of IV contrast was injected into the left upper extremity venous system demonstrating that the vein was in fact patent but very small and displaced caudally. It was then punctured and a 9 French sheath advanced over the guidewire. At this point the [place] 1688 active fixation pacing lead was advanced under fluoroscopic guidance into the right ventricle. The St. Jude model [id number] active fixation pacing lead was advanced into the right atrium. The R waves are reduced throughout. At the initial site, the R waves measured 30mv, and active-fixation demonstrated a large injury current. 10 V pacing did not submit the diaphragm. Attention was then turned to placement of the atrial lead. It was placed in the anterolateral portion of the right atrium. P waves measured 2 mV. The pacing  impedance was 500 ohms. With the satisfactory parameters, the lead was secured to the fascial plane with silk suture. Of note, hemostasis was difficult to obtain. Next a subcutaneous pocket was made with electrocautery. The sewing sleeves were secured with silk suture. At this point hemostasis was still very difficult. We evaluated the atrial lead at this point and the atrial lead was not reliably capture. It was repositioned. The atrial lead ultimately dislodged a second time. It was repositioned again. At this point the [facility]. Jude dual-chamber pacemaker was connected to the atrial and ventricular leads in place back in the subcutaneous pocket. Unfortunately the lead could not reliably capture. A new St. Jude lead was inserted into the left subclavian vein and advanced to the right atrium and was found work satisfactorily. Unfortunately hemostasis was very difficult to obtain. While we were in fact trying to obtain hemostasis, we noticed that both atrial and ventricular leads have been retracted for reasons that are not entirely clear. At this point we were over 2-1/2 hours into the procedure, and because we could not obtain hemostasis, I elected to remove both leads.  At this point we focused on hemostasis of the left subclavian pocket. Additional exposure utilizing blunt dissection and electrocautery demonstrated what appeared to be a lacerated subclavian vein. It was bleeding freely and rapidly. After pressure was held, sutures were placed over the vein and subcutaneous tissue and hemostasis was obtained. At this point it was deemed most appropriate to close the pocket and attempt to place the pacemaker on the contralateral side.  The left subclavian pocket was irrigated, and electrocautery used to help assure hemostasis. Multiple Prolene mattress sutures were then used to secure the skin. At this point I scrubbed  out of the case to attempt pacemaker insertion on the right side.  After the usual preparation  and draping on the right side, 30 cc of lidocaine was infiltrated into the right infraclavicular region. A second venogram was carried out demonstrating a patent right subclavian vein. It was centrally punctured x2. This time the Medtronic model 5076, 52 cm active-fixation pacing lead, serial number [id number] was inserted into the right ventricle and the Medtronic model 5076, 45 cm active-fixation pacing lead, serial number [id number] was inserted into the right atrium. Mapping of the right ventricle was carried out first and that the final site the R waves measured 7 mV, pacing impedance was 524 ohms, and the threshold was less than a volt at 0.4 ms. attention was then turned to placement of the atrial lead was placed in the anterolateral portion of the right atrium. The P wave measured 2 mV. The pacing impedance was 415 ohms. Threshold was 2 V at 0.4 ms. At this point the leads were secured to the fascial plane with silk suture and the sewing sleeve secured with silk suture. Electrocautery was utilized to assure hemostasis. The St. Jude dual chamber pacemaker, serial number [id number] was connected to the atrial and ventricular pacing leads in place back and the subcutaneous pocket. The pocket was irrigated with antibiotic irrigation. The incision was closed with 2 layers of Vicryl suture. Benzoin and Steri-Strips for painted on the skin, and a pressure dressing placed on both pockets, and the patient returned to her room in satisfactory condition.  Complications: The procedure was complicated by left sided pocket bleeding with an estimated 250 cc of blood loss. The patient was transfused with 2 units of fresh frozen plasma.  Conclusion: This demonstrates successful albeit difficult insertion of St. Jude dual-chamber pacemaker in a patient with symptomatic bradycardia, paroxysmal atrial fibrillation with rapid ventricular response, status post cardioversion. This procedure took 4 hours.  Cristopher Peru, M.D.

## 2014-04-23 NOTE — Progress Notes (Signed)
Physical Therapy Discharge Patient Details Name: [name] MRN: [id number] DOB: Sep 18, 1925 Today's Date: 04/23/2014 Time:  -     Patient discharged from PT services secondary to medical decline - will need to re-order PT to resume therapy services. Pt now with temporary transvenous pacemaker in Right groin.   Please see latest therapy progress note for current level of functioning and progress toward goals.    Progress and discharge plan discussed with patient and/or caregiver: pt unavailable  GP     Iban Utz Pager [phone]  04/23/2014, 7:32 AM

## 2014-04-23 NOTE — Progress Notes (Signed)
Right femoral venous sheath in rt groin, site level 0. Rt DP pulse 1+. Sheath pulled and manual pressure held for 15 minutes. Hemostasis obtained, site level 0.Tegaderm dressing applied. Rt DP 1+. Vital signs stable. Patient remains sedated; unable at this time to give patient instructions. Rt knee immobilizer remains on. Unit nurse updated. Bedrest started at 1850; patient's nurse aware. No complications.

## 2014-04-24 ENCOUNTER — Encounter (HOSPITAL_COMMUNITY): Payer: Self-pay | Admitting: Cardiology

## 2014-04-24 ENCOUNTER — Inpatient Hospital Stay (HOSPITAL_COMMUNITY): Payer: Medicare Other

## 2014-04-24 DIAGNOSIS — I10 Essential (primary) hypertension: Secondary | ICD-10-CM

## 2014-04-24 DIAGNOSIS — I495 Sick sinus syndrome: Principal | ICD-10-CM

## 2014-04-24 LAB — PREPARE FRESH FROZEN PLASMA
UNIT DIVISION: 0
UNIT DIVISION: 0
Unit division: 0
Unit division: 0
Unit division: 0
Unit division: 0

## 2014-04-24 LAB — BASIC METABOLIC PANEL
BUN: 27 mg/dL — AB (ref 6–23)
CALCIUM: 9.1 mg/dL (ref 8.4–10.5)
CO2: 24 meq/L (ref 19–32)
CREATININE: 1.24 mg/dL — AB (ref 0.50–1.10)
Chloride: 100 mEq/L (ref 96–112)
GFR calc Af Amer: 44 mL/min — ABNORMAL LOW (ref >90)
GFR, EST NON AFRICAN AMERICAN: 38 mL/min — AB (ref >90)
GLUCOSE: 127 mg/dL — AB (ref 70–99)
Potassium: 4 mEq/L (ref 3.7–5.3)
Sodium: 139 mEq/L (ref 137–147)

## 2014-04-24 LAB — CBC
HCT: 29.9 % — ABNORMAL LOW (ref 36.0–46.0)
HCT: 28.1 % — ABNORMAL LOW (ref 36.0–46.0)
HEMOGLOBIN: 9.4 g/dL — AB (ref 12.0–15.0)
Hemoglobin: 8.9 g/dL — ABNORMAL LOW (ref 12.0–15.0)
MCH: 28 pg (ref 26.0–34.0)
MCH: 28.2 pg (ref 26.0–34.0)
MCHC: 31.4 g/dL (ref 30.0–36.0)
MCHC: 31.7 g/dL (ref 30.0–36.0)
MCV: 89 fL (ref 78.0–100.0)
MCV: 88.9 fL (ref 78.0–100.0)
PLATELETS: 131 10*3/uL — AB (ref 150–400)
Platelets: 136 10*3/uL — ABNORMAL LOW (ref 150–400)
RBC: 3.36 MIL/uL — AB (ref 3.87–5.11)
RBC: 3.16 MIL/uL — ABNORMAL LOW (ref 3.87–5.11)
RDW: 14.7 % (ref 11.5–15.5)
RDW: 14.9 % (ref 11.5–15.5)
WBC: 9.2 10*3/uL (ref 4.0–10.5)
WBC: 8.2 10*3/uL (ref 4.0–10.5)

## 2014-04-24 NOTE — Progress Notes (Signed)
Discussed foley need with attending provider. Order to maintain foley till tomorrow due to patients hypotension and decreased urine output and  Post op monitoring of bilateral upper extremities. J.Zana Biancardi RN

## 2014-04-24 NOTE — Progress Notes (Signed)
Spoke with Dr. Elias Else regarding Eliquis dose due at 2200, aware patient received FFP during pacemaker placement today, instructed to hold 2200 dose this PM. Also aware of desat to 80% x1, 85% x1 while asleep, aware O2 increased to 6L nasal cannula with increased O2 sat >92% upon awakening. Aware CXR recently completed post procedure. Will continue to assess and monitor patient closely.

## 2014-04-24 NOTE — Progress Notes (Signed)
Patient ID: [name], female   DOB: 1925/10/16, 80 y.o.   MRN: [id number]   Patient Name: [name] Date of Encounter: 04/24/2014     Principal Problem:   Acute on chronic diastolic CHF (congestive heart failure) Active Problems:   Shortness of breath   Bradycardia, sinus   DDD (degenerative disc disease), thoracolumbar   Hypothyroidism   Atrial fibrillation   Coronary disease non obstucted    Hypertension   Chronic anticoagulation   Chronic renal disease, stage III   Acute exacerbation of CHF (congestive heart failure)    SUBJECTIVE  "I am sore"  CURRENT MEDS . amiodarone  200 mg Oral Daily  . apixaban  2.5 mg Oral BID    OBJECTIVE  Filed Vitals:   04/24/14 0400 04/24/14 0500 04/24/14 0600 04/24/14 0700  BP: 103/47 103/25 91/38 94/26   Pulse: 60 69 60 60  Temp: 97.6 F (36.4 C)     TempSrc: Oral     Resp: 15 18 18 14   Height:      Weight:  156 lb 4.9 oz (70.9 kg)    SpO2: 96% 93% 96% 99%    Intake/Output Summary (Last 24 hours) at 04/24/14 0749 Last data filed at 04/24/14 0700  Gross per 24 hour  Intake 2663.75 ml  Output   1076 ml  Net 1587.75 ml   Filed Weights   04/22/14 0414 04/23/14 0300 04/24/14 0500  Weight: 149 lb 14.6 oz (68 kg) 158 lb 11.7 oz (72 kg) 156 lb 4.9 oz (70.9 kg)    PHYSICAL EXAM  General: Pleasant, well bandaged, NAD. Neuro: Alert and oriented X 3. Moves all extremities spontaneously. Psych: Normal affect. Patient describes hallucinations in the night. HEENT:  Normal  Neck: Supple without bruits or JVD. Lungs:  Resp regular and unlabored, CTA. Right and left PPM bandages are clean and dry. Heart: RRR no s3, s4, or murmurs. Abdomen: Soft, non-tender, non-distended, BS + x 4.  Extremities: No clubbing, cyanosis or edema. DP/PT/Radials 2+ and equal bilaterally.  Accessory Clinical Findings  CBC  Recent Labs  04/23/14 1459 04/24/14 0500  WBC 7.4 9.2  HGB 11.3* 9.4*  HCT 36.0 29.9*  MCV 89.3 89.0  PLT  161 160*   Basic Metabolic Panel  Recent Labs  04/23/14 0423 04/24/14 0500  NA 138 139  K 4.5 4.0  CL 101 100  CO2 23 24  GLUCOSE 125* 127*  BUN 27* 27*  CREATININE 1.43* 1.24*  CALCIUM 9.1 9.1   Liver Function Tests No results found for this basename: AST, ALT, ALKPHOS, BILITOT, PROT, ALBUMIN,  in the last 72 hours No results found for this basename: LIPASE, AMYLASE,  in the last 72 hours Cardiac Enzymes No results found for this basename: CKTOTAL, CKMB, CKMBINDEX, TROPONINI,  in the last 72 hours BNP No components found with this basename: POCBNP,  D-Dimer No results found for this basename: DDIMER,  in the last 72 hours Hemoglobin A1C No results found for this basename: HGBA1C,  in the last 72 hours Fasting Lipid Panel No results found for this basename: CHOL, HDL, LDLCALC, TRIG, CHOLHDL, LDLDIRECT,  in the last 72 hours Thyroid Function Tests No results found for this basename: TSH, T4TOTAL, FREET3, T3FREE, THYROIDAB,  in the last 72 hours  TELE AV paced   Radiology/Studies  Dg Chest 2 View  04/19/2014   CLINICAL DATA:  Shortness of breath and wheezing  EXAM: CHEST  2 VIEW  COMPARISON:  Apr 13, 2014  FINDINGS:  There remains cardiomegaly mild pulmonary venous hypertension. There is a mild degree of interstitial edema, minimally less than on recent prior study. There is no airspace consolidation. No new opacity. No adenopathy. There is thoracolumbar levoscoliosis. There is postoperative change in the right shoulder.  IMPRESSION: Evidence of a degree of congestive heart failure with marginally less edema compared to recent prior study. No airspace consolidation.   Electronically Signed   By: Lowella Grip M.D.   On: 04/19/2014 19:03   Dg Chest 2 View  04/13/2014   CLINICAL DATA:  Short of breath.  EXAM: CHEST  2 VIEW  COMPARISON:  02/24/2014  FINDINGS: Cardiac silhouette is mildly enlarged. Normal mediastinal contours. No hilar masses.  There are irregularly thickened  interstitial markings bilaterally similar to the prior exam. No focal lung consolidation. No pleural effusion or pneumothorax.  The bony thorax is demineralized but grossly intact.  IMPRESSION: Cardiomegaly with diffuse bilateral interstitial thickening suggests congestive heart failure. No focal consolidation to suggest lobar pneumonia.   Electronically Signed   By: Lajean Manes M.D.   On: 04/13/2014 17:06   Dg Chest Portable 1 View  04/23/2014   CLINICAL DATA:  Cardiac pacemaker implant.  Rule out pneumothorax.  EXAM: PORTABLE CHEST - 1 VIEW  COMPARISON:  04/19/2014  FINDINGS: A right dual lead cardiac pacemaker has been placed. Leads in the region of the right atrium and right ventricle. No evidence for a pneumothorax. Coarse lung markings suggest chronic changes. Hazy densities in the right lung suggest mild edema or atelectasis. There is volume loss or consolidation at the left lung base. Prominent linear densities along the periphery of the left lung could be related to interstitial edema.  IMPRESSION: Placement of right cardiac pacemaker.  Negative for a pneumothorax.  Densities at the left lung base are suggestive for atelectasis.  Probable mild edema.   Electronically Signed   By: Markus Daft M.D.   On: 04/23/2014 20:44    ASSESSMENT AND PLAN 1. Symptomatic bradycardia 2. Symptomatic atrial fib with an RVR, s/p DCCV 72 hours ago. 3. S/p PPM complicated by severe bleeding 4. Hypotension, presumably from blood loss Rec: A difficult constellation of problems. Bleeding during her PPM was excessive and associated with drop in Hgb. On the other hand, she has been cardioverted and is at increased risk for clotting. Her Eliquis was held yesterday and I think we will hold again today, look at wounds in the morning and if stable, restart Eliquis tomorrow. Will follow CBC as well. Her prognosis is guarded.  Curley Fayette,M.D.  Romulo Okray,M.D.  04/24/2014 7:49 AM

## 2014-04-24 NOTE — Progress Notes (Signed)
Orthopedic Tech Progress Note Patient Details:  [name] 06/23/1925 [id number] Spoke with nurse over the phone. Patient has arm sling, no action needed form Ortho Tech at this time. Patient ID: [name], female   DOB: 10/10/1925, 80 y.o.   MRN: [id number]   [name] 04/24/2014, 10:40 AM

## 2014-04-24 NOTE — Progress Notes (Addendum)
[facility] PROGRESS NOTE  [name] BSJ:[id number] DOB: 1925/08/07 DOA: 04/19/2014 PCP: [name], MD  Assessment/Plan: 1. Acute diastolic congestive heart failure. -Transthoracic echocardiogram performed on 02/26/2014 showing ejection fraction of 60-65% without wall motion abnormalities -She presents with clinical signs symptoms suggestive of acute CHF, with initial lab showed an elevated BNP of 3779 - change to 40 mg by mouth daily this morning. - She diuresed 1,600 mL 6/8; She initially received 40 mg of IV Lasix, was chaned Lasix 40 mg PO q daily>> had episode of hypotension 6/9>> diuretics were then held 6/9 d/t hypotension as discussed below, cardiology to follow and resume when appropriate>> follow. -Patient remains on amiodarone, follow.  2.  Dizziness -Patient reporting dizziness today likely resulting from hypotension and bradycardia>> see treatment as discussed below  3.  Hypotension -Likely secondary to antihypertensive agents and diuresis -Systolic blood pressures in the 70's to 90's this morning and 6/9, likely explaining her dizziness.  -Coreg DC'd on 6/9 d/t hypotension and bradycardia -Patient required vasopressors on 6/9>> initially dopamine, changed to Neo-Synephrine (d/t persistent nausea vomiting) -Resolved status post temporal pacer initiated last p.m. per cards, and vasopressors discontinued -Follow.  4. Chest Pain -Overnight 6/8-9 patient complaining of chest pain  -Had cath in march of 2015 which showed nonobstructive CAD. Cardiac enzymes were cycled and remained negative.  -Per cards  5.  Atrial fibrillation with symptomatic bradycardia /sick sinus syndrome -P.m. of 6/9 patient became bradycardic down to the 30s >> Coreg was discontinued and amiodarone held. -On followup per cards last p.m. patient became symptomatic>> transvenous pacer was placed 6/9 -Permanent pacemaker placed today 6/10 per cardiology -Patient remains on  amiodarone, follow.  6. Anticoagulation - apixaban remains on hold today s/p pacemaker placement on 7/65 complicated by bleeding  7. Hypertension -Antihypertensives discontinued on 6/9 d/t hypotension as above -BP stable today, follow.  8. S/p Pacemaker with Bleeding complication -apixaban on hold per cards -hgb 9.4 from 11.3 on 6/10>> continue to monitor and transfuse as appropriate -BP stable Code Status: Full Family Communication:  Disposition Plan: Cardiology consulted  Procedures -Temporal transvenous pacemaker placement on 6/9 per Dr. Burt Knack -Per renal and pacemaker placement 6/10 Consultants  -Cardiology  HPI/Subjective: sore in chest area s/p pacemaker insertion. Denies cp, and no SOB  Objective: Filed Vitals:   04/24/14 1405  BP: 97/29  Pulse: 64  Temp:   Resp: 17    Intake/Output Summary (Last 24 hours) at 04/24/14 1508 Last data filed at 04/24/14 1400  Gross per 24 hour  Intake 3048.75 ml  Output   1264 ml  Net 1784.75 ml   Filed Weights   04/22/14 0414 04/23/14 0300 04/24/14 0500  Weight: 68 kg (149 lb 14.6 oz) 72 kg (158 lb 11.7 oz) 70.9 kg (156 lb 4.9 oz)    Exam:   General:  s/p permanent pacemaker in no acute distress awake alert oriented x3  Cardiovascular: 2-6 systolic ejection murmur. Chest area with pressure dressing clean and dry  Respiratory: Clear to auscultation and a few by basilar crackles normal respiratory effort  Abdomen: Soft nontender nondistended  Musculoskeletal: No edema   Data Reviewed: Basic Metabolic Panel:  Recent Labs Lab 04/20/14 0408 04/21/14 0104 04/22/14 0356 04/23/14 0423 04/24/14 0500  NA 143 141 140 138 139  K 3.9 4.1 4.3 4.5 4.0  CL 103 100 99 101 100  CO2 27 29 30 23 24   GLUCOSE 109* 93 93 125* 127*  BUN 26* 28* 27* 27* 27*  CREATININE  1.43* 1.47* 1.65* 1.43* 1.24*  CALCIUM 9.6 9.7 9.5 9.1 9.1   Liver Function Tests: No results found for this basename: AST, ALT, ALKPHOS, BILITOT, PROT,  ALBUMIN,  in the last 168 hours No results found for this basename: LIPASE, AMYLASE,  in the last 168 hours No results found for this basename: AMMONIA,  in the last 168 hours CBC:  Recent Labs Lab 04/19/14 1722 04/21/14 0104 04/23/14 0423 04/23/14 1459 04/24/14 0500  WBC 6.3 5.7 5.3 7.4 9.2  HGB 12.0 11.8* 11.6* 11.3* 9.4*  HCT 37.9 37.2 37.1 36.0 29.9*  MCV 89.2 88.8 90.7 89.3 89.0  PLT 173 167 143* 161 136*   Cardiac Enzymes:  Recent Labs Lab 04/20/14 0408 04/20/14 1049 04/20/14 2030 04/21/14 0104 04/21/14 0638  TROPONINI <0.30 <0.30 <0.30 <0.30 <0.30   BNP (last 3 results)  Recent Labs  04/13/14 1646 04/19/14 1722 04/20/14 0408  PROBNP 4319.0* 3779.0* 3528.0*   CBG: No results found for this basename: GLUCAP,  in the last 168 hours  Recent Results (from the past 240 hour(s))  MRSA PCR SCREENING     Status: None   Collection Time    04/22/14  4:45 PM      Result Value Ref Range Status   MRSA by PCR NEGATIVE  NEGATIVE Final   Comment:            The GeneXpert MRSA Assay (FDA     approved for NASAL specimens     only), is one component of a     comprehensive MRSA colonization     surveillance program. It is not     intended to diagnose MRSA     infection nor to guide or     monitor treatment for     MRSA infections.     Studies: Dg Chest Portable 1 View  04/23/2014   CLINICAL DATA:  Cardiac pacemaker implant.  Rule out pneumothorax.  EXAM: PORTABLE CHEST - 1 VIEW  COMPARISON:  04/19/2014  FINDINGS: A right dual lead cardiac pacemaker has been placed. Leads in the region of the right atrium and right ventricle. No evidence for a pneumothorax. Coarse lung markings suggest chronic changes. Hazy densities in the right lung suggest mild edema or atelectasis. There is volume loss or consolidation at the left lung base. Prominent linear densities along the periphery of the left lung could be related to interstitial edema.  IMPRESSION: Placement of right cardiac  pacemaker.  Negative for a pneumothorax.  Densities at the left lung base are suggestive for atelectasis.  Probable mild edema.   Electronically Signed   By: Markus Daft M.D.   On: 04/23/2014 20:44    Scheduled Meds: . amiodarone  200 mg Oral Daily   Continuous Infusions: . sodium chloride 75 mL/hr at 04/24/14 1256    Principal Problem:   Acute on chronic diastolic CHF (congestive heart failure) Active Problems:   Shortness of breath   Bradycardia, sinus   DDD (degenerative disc disease), thoracolumbar   Hypothyroidism   Atrial fibrillation   Coronary disease non obstucted    Hypertension   Chronic anticoagulation   Chronic renal disease, stage III   Acute exacerbation of CHF (congestive heart failure)    Time spent: 25 min    [facility] Pager [phone]. If 7PM-7AM, please contact night-coverage at [email or web address], password [facility] 04/24/2014, 3:08 PM  LOS: 5 days

## 2014-04-24 NOTE — Progress Notes (Signed)
Clinical Social Work Department BRIEF PSYCHOSOCIAL ASSESSMENT 04/24/2014  Patient:  [name], [name]     Account Number:  [id number]     Admit date:  08/01/2014  Clinical Social Worker:  Freeman Caldron  Date/Time:  04/24/2014 01:42 PM  Referred by:  Physician  Date Referred:  04/24/2014 Referred for  Other - See comment   Other Referral:   SNF placement?   Interview type:  Patient Other interview type:    PSYCHOSOCIAL DATA Living Status:  ALONE Admitted from facility:   Level of care:   Primary support name:  [name] [phone]) Primary support relationship to patient:  CHILD, ADULT Degree of support available:   Good--pt has support from daughter.    CURRENT CONCERNS Current Concerns  Post-Acute Placement   Other Concerns:    SOCIAL WORK ASSESSMENT / PLAN CSW introduced self and explained that when PT/OT work with pt they may recommend she get rehab before returning home. Pt states they have not been able to work with her yet due to complications from pacemaker procedure yesterday and exhaustion. CSW following and provided support to pt; RNCM and CSW coordinate to establish most appropriate discharge plan.   Assessment/plan status:  Psychosocial Support/Ongoing Assessment of Needs Other assessment/ plan:   Information/referral to community resources:   SNF? Home health?    PATIENT'S/FAMILY'S RESPONSE TO PLAN OF CARE: Good--pt participated in conversation with CSW and thanked CSW for concern--pt stated she was tired and asked CSW to follow up after PT/OT have worked with her.       Ky Barban, MSW, [facility] Clinical Social Worker [phone]

## 2014-04-25 ENCOUNTER — Inpatient Hospital Stay (HOSPITAL_COMMUNITY): Payer: Medicare Other

## 2014-04-25 DIAGNOSIS — R928 Other abnormal and inconclusive findings on diagnostic imaging of breast: Secondary | ICD-10-CM

## 2014-04-25 DIAGNOSIS — E039 Hypothyroidism, unspecified: Secondary | ICD-10-CM

## 2014-04-25 DIAGNOSIS — I2510 Atherosclerotic heart disease of native coronary artery without angina pectoris: Secondary | ICD-10-CM

## 2014-04-25 LAB — BASIC METABOLIC PANEL
BUN: 19 mg/dL (ref 6–23)
CHLORIDE: 104 meq/L (ref 96–112)
CO2: 25 meq/L (ref 19–32)
CREATININE: 0.97 mg/dL (ref 0.50–1.10)
Calcium: 8.9 mg/dL (ref 8.4–10.5)
GFR calc Af Amer: 59 mL/min — ABNORMAL LOW (ref >90)
GFR calc non Af Amer: 51 mL/min — ABNORMAL LOW (ref >90)
Glucose, Bld: 136 mg/dL — ABNORMAL HIGH (ref 70–99)
Potassium: 4.7 mEq/L (ref 3.7–5.3)
Sodium: 139 mEq/L (ref 137–147)

## 2014-04-25 LAB — CBC
HCT: 24.9 % — ABNORMAL LOW (ref 36.0–46.0)
Hemoglobin: 7.8 g/dL — ABNORMAL LOW (ref 12.0–15.0)
MCH: 28.4 pg (ref 26.0–34.0)
MCHC: 31.3 g/dL (ref 30.0–36.0)
MCV: 90.5 fL (ref 78.0–100.0)
Platelets: 112 10*3/uL — ABNORMAL LOW (ref 150–400)
RBC: 2.75 MIL/uL — ABNORMAL LOW (ref 3.87–5.11)
RDW: 15.1 % (ref 11.5–15.5)
WBC: 6.4 10*3/uL (ref 4.0–10.5)

## 2014-04-25 LAB — PREPARE RBC (CROSSMATCH)

## 2014-04-25 LAB — GLUCOSE, CAPILLARY: Glucose-Capillary: 146 mg/dL — ABNORMAL HIGH (ref 70–99)

## 2014-04-25 MED ORDER — SENNA 8.6 MG PO TABS
1.0000 | ORAL_TABLET | Freq: Every day | ORAL | Status: DC
  Administered 2014-04-27 – 2014-05-02 (×5): 8.6 mg via ORAL
  Filled 2014-04-25 (×8): qty 1

## 2014-04-25 MED ORDER — LORAZEPAM 0.5 MG PO TABS
0.2500 mg | ORAL_TABLET | Freq: Once | ORAL | Status: AC
  Administered 2014-04-25: 0.25 mg via ORAL
  Filled 2014-04-25: qty 1

## 2014-04-25 MED ORDER — SODIUM CHLORIDE 0.9 % IV SOLN
INTRAVENOUS | Status: AC
  Administered 2014-04-25: 10:00:00 via INTRAVENOUS

## 2014-04-25 MED ORDER — BISACODYL 5 MG PO TBEC
10.0000 mg | DELAYED_RELEASE_TABLET | Freq: Every day | ORAL | Status: DC | PRN
  Administered 2014-04-25: 10 mg via ORAL
  Filled 2014-04-25: qty 2

## 2014-04-25 MED ORDER — BISACODYL 10 MG RE SUPP: 10.0000 mg | Freq: Every day | RECTAL | Status: DC | PRN

## 2014-04-25 NOTE — Progress Notes (Signed)
[facility] PROGRESS NOTE  [name] VPX:[id number] DOB: 05-20-1925 DOA: 04/19/2014 PCP: [name], MD  Assessment/Plan: 1. Acute diastolic congestive heart failure. -Transthoracic echocardiogram performed on 02/26/2014 showing ejection fraction of 60-65% without wall motion abnormalities -She presents with clinical signs symptoms suggestive of acute CHF, with initial lab showed an elevated BNP of 3779 - change to 40 mg by mouth daily this morning. - She diuresed 1,600 mL 6/8; She initially received 40 mg of IV Lasix, was chaned Lasix 40 mg PO q daily>> had episode of hypotension 6/9>> diuretics were then held 6/9 d/t hypotension as discussed below, cardiology to follow and resume when appropriate>> follow. -Patient remains on amiodarone, follow.  2.  Dizziness -Patient reporting dizziness 6/9 likely resulting from hypotension and bradycardia>> was treated as discussed below  3.  Hypotension -Likely secondary to antihypertensive agents and diuresis -Systolic blood pressures in the 70's to 90's this morning and 6/9, likely explaining her dizziness.  -Coreg DC'd on 6/9 d/t hypotension and bradycardia -Patient required vasopressors on 6/9>> initially dopamine, changed to Neo-Synephrine (d/t persistent nausea vomiting) -Resolved status post temporal pacer initiated last p.m. per cards, and vasopressors discontinued -Follow.  4. Chest Pain -Overnight 6/8-9 patient complaining of chest pain  -Had cath in march of 2015 which showed nonobstructive CAD. Cardiac enzymes were cycled and remained negative.  -Per cards  5.  Atrial fibrillation with symptomatic bradycardia /sick sinus syndrome -P.m. of 6/9 patient became bradycardic down to the 30s >> Coreg was discontinued and amiodarone held. -On followup per cards last p.m. patient became symptomatic>> transvenous pacer was placed 6/9 -Permanent pacemaker placed  6/10 per cardiology -Patient remains on amiodarone,  follow.  6. Anticoagulation - apixaban remains on hold today s/p pacemaker placement on 4/62 complicated by bleeding  7. Hypertension -Antihypertensives discontinued on 6/9 d/t hypotension as above -BP stable today, follow.  8. acute blood loss anemia, S/p Pacemaker with Bleeding complication -apixaban on hold per cards -hgb 7.8 from 9.4 6/11 and 11.3 on 6/10, and she is hypotensive as well today -Will transfuse a unit of packed red blood cells follow and recheck  Code Status: Full Family Communication: None at bedside Disposition Plan: Cardiology consulted  Procedures -Temporal transvenous pacemaker placement on 6/9 per Dr. Burt Knack -Per renal and pacemaker placement 6/10 Consultants  -Cardiology  HPI/Subjective: Reports still sore in the chest bilaterally, no gross bleeding  Objective: Filed Vitals:   04/25/14 0744  BP: 96/33  Pulse: 60  Temp: 98.1 F (36.7 C)  Resp: 16    Intake/Output Summary (Last 24 hours) at 04/25/14 0751 Last data filed at 04/25/14 0600  Gross per 24 hour  Intake   2445 ml  Output   1251 ml  Net   1194 ml   Filed Weights   04/22/14 0414 04/23/14 0300 04/24/14 0500  Weight: 68 kg (149 lb 14.6 oz) 72 kg (158 lb 11.7 oz) 70.9 kg (156 lb 4.9 oz)    Exam:   General:  s/p permanent pacemaker in no acute distress awake alert oriented x3  Cardiovascular: 2-6 systolic ejection murmur. Chest area with pressure dressing clean and dry  Respiratory: Clear to auscultation and a few by basilar crackles normal respiratory effort  Abdomen: Soft nontender nondistended  Musculoskeletal: No edema   Data Reviewed: Basic Metabolic Panel:  Recent Labs Lab 04/20/14 0408 04/21/14 0104 04/22/14 0356 04/23/14 0423 04/24/14 0500  NA 143 141 140 138 139  K 3.9 4.1 4.3 4.5 4.0  CL 103 100 99  101 100  CO2 27 29 30 23 24   GLUCOSE 109* 93 93 125* 127*  BUN 26* 28* 27* 27* 27*  CREATININE 1.43* 1.47* 1.65* 1.43* 1.24*  CALCIUM 9.6 9.7 9.5 9.1 9.1    Liver Function Tests: No results found for this basename: AST, ALT, ALKPHOS, BILITOT, PROT, ALBUMIN,  in the last 168 hours No results found for this basename: LIPASE, AMYLASE,  in the last 168 hours No results found for this basename: AMMONIA,  in the last 168 hours CBC:  Recent Labs Lab 04/23/14 0423 04/23/14 1459 04/24/14 0500 04/24/14 2022 04/25/14 0342  WBC 5.3 7.4 9.2 8.2 6.4  HGB 11.6* 11.3* 9.4* 8.9* 7.8*  HCT 37.1 36.0 29.9* 28.1* 24.9*  MCV 90.7 89.3 89.0 88.9 90.5  PLT 143* 161 136* 131* 112*   Cardiac Enzymes:  Recent Labs Lab 04/20/14 0408 04/20/14 1049 04/20/14 2030 04/21/14 0104 04/21/14 0638  TROPONINI <0.30 <0.30 <0.30 <0.30 <0.30   BNP (last 3 results)  Recent Labs  04/13/14 1646 04/19/14 1722 04/20/14 0408  PROBNP 4319.0* 3779.0* 3528.0*   CBG: No results found for this basename: GLUCAP,  in the last 168 hours  Recent Results (from the past 240 hour(s))  MRSA PCR SCREENING     Status: None   Collection Time    04/22/14  4:45 PM      Result Value Ref Range Status   MRSA by PCR NEGATIVE  NEGATIVE Final   Comment:            The GeneXpert MRSA Assay (FDA     approved for NASAL specimens     only), is one component of a     comprehensive MRSA colonization     surveillance program. It is not     intended to diagnose MRSA     infection nor to guide or     monitor treatment for     MRSA infections.     Studies: Dg Chest Port 1 View  04/24/2014   CLINICAL DATA:  Hypotension.  EXAM: PORTABLE CHEST - 1 VIEW  COMPARISON:  Chest x-ray 04/23/2014.  FINDINGS: Cardiomegaly with diffuse bilateral pulmonary interstitial prominence noted consistent with congestive heart failure and pulmonary interstitial edema. Bilateral pleural effusions. Cardiac pacer noted with lead tips in right atrium and right ventricle.  IMPRESSION: 1. Congestive heart failure with pulmonary interstitial edema and bilateral pleural effusions.  2. Cardiac pacer with lead tips  in the right atrium and right ventricle.   Electronically Signed   By: Marcello Moores  Register   On: 04/24/2014 08:36   Dg Chest Portable 1 View  04/23/2014   CLINICAL DATA:  Cardiac pacemaker implant.  Rule out pneumothorax.  EXAM: PORTABLE CHEST - 1 VIEW  COMPARISON:  04/19/2014  FINDINGS: A right dual lead cardiac pacemaker has been placed. Leads in the region of the right atrium and right ventricle. No evidence for a pneumothorax. Coarse lung markings suggest chronic changes. Hazy densities in the right lung suggest mild edema or atelectasis. There is volume loss or consolidation at the left lung base. Prominent linear densities along the periphery of the left lung could be related to interstitial edema.  IMPRESSION: Placement of right cardiac pacemaker.  Negative for a pneumothorax.  Densities at the left lung base are suggestive for atelectasis.  Probable mild edema.   Electronically Signed   By: Markus Daft M.D.   On: 04/23/2014 20:44    Scheduled Meds: . amiodarone  200 mg Oral Daily  Continuous Infusions: . sodium chloride 75 mL/hr at 04/25/14 4967    Principal Problem:   Acute on chronic diastolic CHF (congestive heart failure) Active Problems:   Shortness of breath   Bradycardia, sinus   DDD (degenerative disc disease), thoracolumbar   Hypothyroidism   Atrial fibrillation   Coronary disease non obstucted    Hypertension   Chronic anticoagulation   Chronic renal disease, stage III   Acute exacerbation of CHF (congestive heart failure)    Time spent: 25 min    [facility] Pager [phone]. If 7PM-7AM, please contact night-coverage at [email or web address], password [facility] 04/25/2014, 7:51 AM  LOS: 6 days

## 2014-04-25 NOTE — Progress Notes (Signed)
Patient Name: [name]      SUBJECTIVE: with shortness of breath and chest heaviness  Low blood pressure  Past Medical History  Diagnosis Date  . Dyslipidemia   . Hypertension   . Palpitations     PVCs/bigeminy on event in May 2009 revealing this was relatively asymptomatic  . Chest pain     a. 2011 Neg MV;  b. 01/2014 Cath: LM 20-30, LAD nl, D1 nl, LCX nl, OM1 nl, RCA dom 30-54m, PD/PL nl, EF 65%->Med Rx.  . Degenerative joint disease   . Hx of varicella   . History of skin cancer     tafeen/ non melanoma.   . Thrombocytopenia   . Anemia   . Monoclonal gammopathies   . Right lower quadrant abdominal pain 07/19/2012    Recurrent  With nausea    This is new since she had ct for llq pain in [facility]    R/o appendiceal problem  Hernia  Get ct scan  And plan  Fu     . Diastolic CHF, acute on chronic 01/31/2012  . Atrial fibrillation     a. Dx 01/2014->Eliquis started.    Scheduled Meds:  Scheduled Meds: . amiodarone  200 mg Oral Daily   Continuous Infusions: . sodium chloride 75 mL/hr at 04/25/14 0734    PHYSICAL EXAM Filed Vitals:   04/25/14 0300 04/25/14 0400 04/25/14 0600 04/25/14 0744  BP: 92/29 90/34 91/63  96/33  Pulse: 60 60 63 60  Temp:    98.1 F (36.7 C)  TempSrc:    Oral  Resp: 17 18 11 16   Height:      Weight:      SpO2: 94% 94% 96% 95%    General appearance: alert, cooperative and no distress Lungs: rales bibasilar Heart: regular rate and rhythm, S1, S2 normal, no murmur, click, rub or gallop Extremities: extremities normal, atraumatic, no cyanosis or edema Skin: ecchymoss left shoulder Neurologic: Alert and oriented X 3, normal strength and tone. Normal symmetric reflexes. Normal coordination and gait  TELEMETRY: Reviewed telemetry pt in  Atrial paced    Intake/Output Summary (Last 24 hours) at 04/25/14 0909 Last data filed at 04/25/14 0858  Gross per 24 hour  Intake   2325 ml  Output   1196 ml  Net   1129 ml    LABS: Basic  Metabolic Panel:  Recent Labs Lab 04/19/14 1722 04/20/14 0408 04/21/14 0104 04/22/14 0356 04/23/14 0423 04/24/14 0500  NA 141 143 141 140 138 139  K 4.6 3.9 4.1 4.3 4.5 4.0  CL 104 103 100 99 101 100  CO2 23 27 29 30 23 24   GLUCOSE 109* 109* 93 93 125* 127*  BUN 28* 26* 28* 27* 27* 27*  CREATININE 1.39* 1.43* 1.47* 1.65* 1.43* 1.24*  CALCIUM 10.0 9.6 9.7 9.5 9.1 9.1   Cardiac Enzymes: No results found for this basename: CKTOTAL, CKMB, CKMBINDEX, TROPONINI,  in the last 72 hours CBC:  Recent Labs Lab 04/19/14 1722 04/21/14 0104 04/23/14 0423 04/23/14 1459 04/24/14 0500 04/24/14 2022 04/25/14 0342  WBC 6.3 5.7 5.3 7.4 9.2 8.2 6.4  HGB 12.0 11.8* 11.6* 11.3* 9.4* 8.9* 7.8*  HCT 37.9 37.2 37.1 36.0 29.9* 28.1* 24.9*  MCV 89.2 88.8 90.7 89.3 89.0 88.9 90.5  PLT 173 167 143* 161 136* 131* 112*   PROTIME:  Recent Labs  04/23/14 1459  LABPROT 19.0*  INR 1.64*   Liver Function Tests: No results found for this  basename: AST, ALT, ALKPHOS, BILITOT, PROT, ALBUMIN,  in the last 72 hours No results found for this basename: LIPASE, AMYLASE,  in the last 72 hours BNP: BNP (last 3 results)  Recent Labs  04/13/14 1646 04/19/14 1722 04/20/14 0408  PROBNP 4319.0* 3779.0* 3528.0*       ASSESSMENT AND PLAN:  Principal Problem:   Acute on chronic diastolic CHF (congestive heart failure) Active Problems:   Shortness of breath   Bradycardia, sinus   DDD (degenerative disc disease), thoracolumbar   Hypothyroidism   Atrial fibrillation   Coronary disease non obstucted    Hypertension   Chronic anticoagulation   Chronic renal disease, stage III   Acute exacerbation of CHF (congestive heart failure)  S/P DIFFICULT PACER insertion procedure cx by bleeding She also is s.p DCCV and apixoban held 2/2 bleeding furhter drop in HgB over night  Will check CXR v CT  Radiology suggests latter to exclude hemothorax as source  Transfuse 1 u  Increase iv fluids to 150 cchr   X 6 hrs Check bmet   Signed, Virl Axe MD  04/25/2014

## 2014-04-25 NOTE — Addendum Note (Signed)
Addendum created 04/25/14 1553 by Roberts Gaudy, MD   Modules edited: Anesthesia Responsible Staff

## 2014-04-26 LAB — BASIC METABOLIC PANEL
BUN: 17 mg/dL (ref 6–23)
CALCIUM: 8.9 mg/dL (ref 8.4–10.5)
CHLORIDE: 102 meq/L (ref 96–112)
CO2: 22 meq/L (ref 19–32)
CREATININE: 0.86 mg/dL (ref 0.50–1.10)
GFR, EST AFRICAN AMERICAN: 68 mL/min — AB (ref >90)
GFR, EST NON AFRICAN AMERICAN: 59 mL/min — AB (ref >90)
Glucose, Bld: 113 mg/dL — ABNORMAL HIGH (ref 70–99)
Potassium: 4.1 mEq/L (ref 3.7–5.3)
Sodium: 136 mEq/L — ABNORMAL LOW (ref 137–147)

## 2014-04-26 LAB — TYPE AND SCREEN
ABO/RH(D): B POS
Antibody Screen: NEGATIVE
Unit division: 0

## 2014-04-26 LAB — CBC
HEMATOCRIT: 28.4 % — AB (ref 36.0–46.0)
Hemoglobin: 9.2 g/dL — ABNORMAL LOW (ref 12.0–15.0)
MCH: 28.8 pg (ref 26.0–34.0)
MCHC: 32.4 g/dL (ref 30.0–36.0)
MCV: 89 fL (ref 78.0–100.0)
Platelets: 128 10*3/uL — ABNORMAL LOW (ref 150–400)
RBC: 3.19 MIL/uL — AB (ref 3.87–5.11)
RDW: 15.4 % (ref 11.5–15.5)
WBC: 10 10*3/uL (ref 4.0–10.5)

## 2014-04-26 LAB — GLUCOSE, CAPILLARY: Glucose-Capillary: 127 mg/dL — ABNORMAL HIGH (ref 70–99)

## 2014-04-26 MED ORDER — LEVOFLOXACIN IN D5W 750 MG/150ML IV SOLN
750.0000 mg | INTRAVENOUS | Status: DC
  Administered 2014-04-26 – 2014-04-30 (×3): 750 mg via INTRAVENOUS
  Filled 2014-04-26 (×3): qty 150

## 2014-04-26 MED ORDER — SODIUM CHLORIDE 0.9 % IJ SOLN
3.0000 mL | Freq: Two times a day (BID) | INTRAMUSCULAR | Status: DC
  Administered 2014-04-26 – 2014-05-02 (×12): 3 mL via INTRAVENOUS

## 2014-04-26 NOTE — Progress Notes (Signed)
[facility] PROGRESS NOTE  [name] TJQ:[id number] DOB: 1925-11-04 DOA: 04/19/2014 PCP: [name], MD  Assessment/Plan: 1. Acute diastolic congestive heart failure. -Transthoracic echocardiogram performed on 02/26/2014 showing ejection fraction of 60-65% without wall motion abnormalities -She presents with clinical signs symptoms suggestive of acute CHF, with initial lab showed an elevated BNP of 3779 - She diuresed 1,600 mL 6/8; She initially received 40 mg of IV Lasix, was changed Lasix 40 mg PO q daily>> had episode of hypotension 6/9>> diuretics were then held 6/9 d/t hypotension as discussed below, cardiology to follow and resume when appropriate>> follow. -Patient remains on amiodarone, follow.  2.  Dizziness -Patient reporting dizziness 6/9 likely resulting from hypotension and bradycardia>> was treated as discussed below  3.  Hypotension -Likely secondary to antihypertensive agents and diuresis -Systolic blood pressures in the 70's to 90's this morning and 6/9, likely explaining her dizziness.  -Coreg DC'd on 6/9 d/t hypotension and bradycardia -Patient required vasopressors on 6/9>> initially dopamine, changed to Neo-Synephrine (d/t persistent nausea vomiting) -Resolved status post temporal pacer initiated last p.m. per cards, and vasopressors discontinued -Improved, BP remaining stable  4. Chest Pain -Overnight 6/8-9 patient complaining of chest pain  -Had cath in march of 2015 which showed nonobstructive CAD. Cardiac enzymes were cycled and remained negative.  -Per cards  5.  Atrial fibrillation with symptomatic bradycardia /sick sinus syndrome -P.m. of 6/9 patient became bradycardic down to the 30s >> Coreg was discontinued and amiodarone held. -On followup per cards last p.m. patient became symptomatic>> transvenous pacer was placed 6/9 -Permanent pacemaker placed  6/10 per cardiology -Patient remains on amiodarone, follow.  6. Anticoagulation -  apixaban remains on hold today s/p pacemaker placement on 7/62 complicated by bleeding>> cards to follow and resume picks and appropriate.  7. Hypertension -Antihypertensives discontinued on 6/9 d/t hypotension as above -BP stable today, follow.  8. acute blood loss anemia, S/p Pacemaker with Bleeding complication -apixaban on hold per cards -hgb 7.8 from 9.4 6/11 and 11.3 on 6/10, and she is hypotensive as well 6/12 -Hemoglobin improved to 9.2 today status post transfusion 2 units of blood on 6/12 9. Probable pneumonia/HCAP -CT scan with bilateral lower lobe airspace consolidation -Will place on empiric antibiotics with Levaquin and follow.  Code Status: Full Family Communication: None at bedside Disposition Plan: Transfer to telemetry  Procedures -Temporal transvenous pacemaker placement on 6/9 per Dr. Burt Knack -Per renal and pacemaker placement 6/10 Consultants  -Cardiology  HPI/Subjective: She is sitting up in chair, she feels better today. She admits to mild cough, denies shortness of breath  Objective: Filed Vitals:   04/26/14 0730  BP: 107/41  Pulse: 60  Temp: 98.2 F (36.8 C)  Resp: 19    Intake/Output Summary (Last 24 hours) at 04/26/14 1024 Last data filed at 04/26/14 0500  Gross per 24 hour  Intake 2471.5 ml  Output    226 ml  Net 2245.5 ml   Filed Weights   04/22/14 0414 04/23/14 0300 04/24/14 0500  Weight: 68 kg (149 lb 14.6 oz) 72 kg (158 lb 11.7 oz) 70.9 kg (156 lb 4.9 oz)    Exam:   General:  s/p permanent pacemaker in no acute distress awake alert oriented x3  Cardiovascular: 2-6 systolic ejection murmur. Chest area with pressure dressing clean and dry  Respiratory: Decreased breath sounds at bases, no wheezes  Abdomen: Soft nontender nondistended  Musculoskeletal: No edema   Data Reviewed: Basic Metabolic Panel:  Recent Labs Lab 04/22/14 0356 04/23/14 0423  04/24/14 0500 04/25/14 1100 04/26/14 0258  NA 140 138 139 139 136*  K 4.3  4.5 4.0 4.7 4.1  CL 99 101 100 104 102  CO2 30 23 24 25 22   GLUCOSE 93 125* 127* 136* 113*  BUN 27* 27* 27* 19 17  CREATININE 1.65* 1.43* 1.24* 0.97 0.86  CALCIUM 9.5 9.1 9.1 8.9 8.9   Liver Function Tests: No results found for this basename: AST, ALT, ALKPHOS, BILITOT, PROT, ALBUMIN,  in the last 168 hours No results found for this basename: LIPASE, AMYLASE,  in the last 168 hours No results found for this basename: AMMONIA,  in the last 168 hours CBC:  Recent Labs Lab 04/23/14 1459 04/24/14 0500 04/24/14 2022 04/25/14 0342 04/26/14 0258  WBC 7.4 9.2 8.2 6.4 10.0  HGB 11.3* 9.4* 8.9* 7.8* 9.2*  HCT 36.0 29.9* 28.1* 24.9* 28.4*  MCV 89.3 89.0 88.9 90.5 89.0  PLT 161 136* 131* 112* 128*   Cardiac Enzymes:  Recent Labs Lab 04/20/14 0408 04/20/14 1049 04/20/14 2030 04/21/14 0104 04/21/14 0638  TROPONINI <0.30 <0.30 <0.30 <0.30 <0.30   BNP (last 3 results)  Recent Labs  04/13/14 1646 04/19/14 1722 04/20/14 0408  PROBNP 4319.0* 3779.0* 3528.0*   CBG:  Recent Labs Lab 04/25/14 2007 04/25/14 2337  GLUCAP 146* 127*    Recent Results (from the past 240 hour(s))  MRSA PCR SCREENING     Status: None   Collection Time    04/22/14  4:45 PM      Result Value Ref Range Status   MRSA by PCR NEGATIVE  NEGATIVE Final   Comment:            The GeneXpert MRSA Assay (FDA     approved for NASAL specimens     only), is one component of a     comprehensive MRSA colonization     surveillance program. It is not     intended to diagnose MRSA     infection nor to guide or     monitor treatment for     MRSA infections.     Studies: Ct Chest Wo Contrast  04/25/2014   CLINICAL DATA:  Evaluate for hemothorax. Failed pacemaker attempted.  EXAM: CT CHEST WITHOUT CONTRAST  TECHNIQUE: Multidetector CT imaging of the chest was performed following the standard protocol without IV contrast.  COMPARISON:  None.  FINDINGS: Small bilateral, low attenuation pleural effusions are  identified. There is airspace consolidation in both lower lobes. Mild increased interstitial markings noted bilaterally compatible with interstitial edema.  The heart size appears enlarged. There is a right chest wall pacer device with leads in the right atrial appendage and right ventricle. There is no pericardial effusion. Calcified atherosclerotic disease involves the thoracic aorta as well as the LAD and RCA coronary arteries. No mediastinal or hilar adenopathy identified.  Cavity containing high attenuation material in the left chest wall measures 6.1 x 3.5 cm and is presumed to represent pocket for pacemaker battery pack, image 14/series 2. There is subcutaneous fat stranding throughout the left breast and left chest wall with overlying skin thickening. No adenopathy identified.  Incidental imaging through the upper abdomen shows no acute findings. There is a cyst arising from the upper pole of the right kidney measuring 2.7 cm.  Review of the visualized osseous structures shows scoliosis deformity and multi level degenerative disc disease.  IMPRESSION: 1. No evidence for hemothorax. 2. Bilateral pleural effusions and mild interstitial edema. 3. Bilateral lower lobe airspace consolidation. 4.  Cardiac enlargement, atherosclerotic disease, and multi vessel coronary artery calcifications. 5. Left chest wall cavity containing high attenuation material is presumed to represent the surgical site of the attempted pacer placement. There is a left chest wall subcutaneous soft tissue infiltration and skin thickening which may reflect cellulitis.   Electronically Signed   By: Kerby Moors M.D.   On: 04/25/2014 17:22    Scheduled Meds: . amiodarone  200 mg Oral Daily  . senna  1 tablet Oral Daily   Continuous Infusions: . sodium chloride 75 mL/hr at 04/25/14 2245    Principal Problem:   Acute on chronic diastolic CHF (congestive heart failure) Active Problems:   Shortness of breath   Bradycardia, sinus    DDD (degenerative disc disease), thoracolumbar   Hypothyroidism   Atrial fibrillation   Coronary disease non obstucted    Hypertension   Chronic anticoagulation   Chronic renal disease, stage III   Acute exacerbation of CHF (congestive heart failure)    Time spent: 35 min    [facility] Pager [phone]. If 7PM-7AM, please contact night-coverage at [email or web address], password [facility] 04/26/2014, 10:24 AM  LOS: 7 days

## 2014-04-26 NOTE — Progress Notes (Addendum)
ANTIBIOTIC CONSULT NOTE - INITIAL  Pharmacy Consult for Levaquin Indication: rule out pneumonia (HCAP)  No Known Allergies  Patient Measurements: Height: 4\' 8"  (142.2 cm) Weight: 156 lb 4.9 oz (70.9 kg) IBW/kg (Calculated) : 36.3  Vital Signs: Temp: 98.2 F (36.8 C) (06/13 0730) Temp src: Oral (06/13 0730) BP: 107/41 mmHg (06/13 0730) Pulse Rate: 60 (06/13 0730) Intake/Output from previous day: 06/12 0701 - 06/13 0700 In: 2771.5 [I.V.:2100; Blood:671.5] Out: 316 [Urine:316] Intake/Output from this shift:    Labs:  Recent Labs  04/24/14 0500 04/24/14 2022 04/25/14 0342 04/25/14 1100 04/26/14 0258  WBC 9.2 8.2 6.4  --  10.0  HGB 9.4* 8.9* 7.8*  --  9.2*  PLT 136* 131* 112*  --  128*  CREATININE 1.24*  --   --  0.97 0.86   Estimated Creatinine Clearance: 36.5 ml/min (by C-G formula based on Cr of 0.86). No results found for this basename: VANCOTROUGH, Corlis Leak, VANCORANDOM, [place], GENTPEAK, GENTRANDOM, TOBRATROUGH, TOBRAPEAK, TOBRARND, AMIKACINPEAK, AMIKACINTROU, AMIKACIN,  in the last 72 hours   Microbiology: Recent Results (from the past 720 hour(s))  MRSA PCR SCREENING     Status: None   Collection Time    04/22/14  4:45 PM      Result Value Ref Range Status   MRSA by PCR NEGATIVE  NEGATIVE Final   Comment:            The GeneXpert MRSA Assay (FDA     approved for NASAL specimens     only), is one component of a     comprehensive MRSA colonization     surveillance program. It is not     intended to diagnose MRSA     infection nor to guide or     monitor treatment for     MRSA infections.    Medical History: Past Medical History  Diagnosis Date  . Dyslipidemia   . Hypertension   . Palpitations     PVCs/bigeminy on event in May 2009 revealing this was relatively asymptomatic  . Chest pain     a. 2011 Neg MV;  b. 01/2014 Cath: LM 20-30, LAD nl, D1 nl, LCX nl, OM1 nl, RCA dom 30-36m, PD/PL nl, EF 65%->Med Rx.  . Degenerative joint disease   . Hx  of varicella   . History of skin cancer     tafeen/ non melanoma.   . Thrombocytopenia   . Anemia   . Monoclonal gammopathies   . Right lower quadrant abdominal pain 07/19/2012    Recurrent  With nausea    This is new since she had ct for llq pain in [facility]    R/o appendiceal problem  Hernia  Get ct scan  And plan  Fu     . Diastolic CHF, acute on chronic 01/31/2012  . Atrial fibrillation     a. Dx 01/2014->Eliquis started.    Assessment: 89yoF recently admitted with acute on chronic diastolic CHF exacerbation discharged 6/1 on lasix 20mg  daily. Did well initially but started noticing swelling. Now s/p PPM this admission 9/56 complicated with bleeding. Pharmacy consulted to dose levaquin for r/o HCAP.   WBC 6.4>10 but afebrile  No cultures   Goal of Therapy:  Eradication of infection  Plan:  Start Levaquin 750mg  IV Q48h F/u cultures, clinical status, need for therapy? Additional abx?  Oval Linsey. Leitha Schuller, PharmD Clinical Pharmacist - Resident Phone: [phone] Pager: [phone] 04/26/2014 10:55 AM

## 2014-04-26 NOTE — Progress Notes (Addendum)
Patient Name: [name]      SUBJECTIVE:   CT neg yesterday for hemothorax BP and HgB stable   Past Medical History  Diagnosis Date  . Dyslipidemia   . Hypertension   . Palpitations     PVCs/bigeminy on event in May 2009 revealing this was relatively asymptomatic  . Chest pain     a. 2011 Neg MV;  b. 01/2014 Cath: LM 20-30, LAD nl, D1 nl, LCX nl, OM1 nl, RCA dom 30-45m, PD/PL nl, EF 65%->Med Rx.  . Degenerative joint disease   . Hx of varicella   . History of skin cancer     tafeen/ non melanoma.   . Thrombocytopenia   . Anemia   . Monoclonal gammopathies   . Right lower quadrant abdominal pain 07/19/2012    Recurrent  With nausea    This is new since she had ct for llq pain in [facility]    R/o appendiceal problem  Hernia  Get ct scan  And plan  Fu     . Diastolic CHF, acute on chronic 01/31/2012  . Atrial fibrillation     a. Dx 01/2014->Eliquis started.    Scheduled Meds:  Scheduled Meds: . amiodarone  200 mg Oral Daily  . senna  1 tablet Oral Daily   Continuous Infusions: . sodium chloride 75 mL/hr at 04/25/14 2245    PHYSICAL EXAM Filed Vitals:   04/26/14 0500 04/26/14 0630 04/26/14 0700 04/26/14 0730  BP: 109/45  107/41 107/41  Pulse: 60 66 60 60  Temp:    98.2 F (36.8 C)  TempSrc:    Oral  Resp: 20 14 20 19   Height:      Weight:      SpO2: 98% 100% 94% 92%    General appearance: alert, cooperative and no distress Lungs: rales bibasilar Heart: regular rate and rhythm, S1, S2 normal, no murmur, click, rub or gallop Extremities: extremities normal, atraumatic, no cyanosis or edema Skin: ecchymoss left shoulder Neurologic: Alert and oriented X 3, normal strength and tone. Normal symmetric reflexes. Normal coordination and gait  TELEMETRY: Reviewed telemetry pt in  Atrial paced    Intake/Output Summary (Last 24 hours) at 04/26/14 0958 Last data filed at 04/26/14 0500  Gross per 24 hour  Intake 2621.5 ml  Output    296 ml  Net  2325.5 ml    LABS: Basic Metabolic Panel:  Recent Labs Lab 04/20/14 0408 04/21/14 0104 04/22/14 0356 04/23/14 0423 04/24/14 0500 04/25/14 1100 04/26/14 0258  NA 143 141 140 138 139 139 136*  K 3.9 4.1 4.3 4.5 4.0 4.7 4.1  CL 103 100 99 101 100 104 102  CO2 27 29 30 23 24 25 22   GLUCOSE 109* 93 93 125* 127* 136* 113*  BUN 26* 28* 27* 27* 27* 19 17  CREATININE 1.43* 1.47* 1.65* 1.43* 1.24* 0.97 0.86  CALCIUM 9.6 9.7 9.5 9.1 9.1 8.9 8.9   Cardiac Enzymes: No results found for this basename: CKTOTAL, CKMB, CKMBINDEX, TROPONINI,  in the last 72 hours CBC:  Recent Labs Lab 04/21/14 0104 04/23/14 0423 04/23/14 1459 04/24/14 0500 04/24/14 2022 04/25/14 0342 04/26/14 0258  WBC 5.7 5.3 7.4 9.2 8.2 6.4 10.0  HGB 11.8* 11.6* 11.3* 9.4* 8.9* 7.8* 9.2*  HCT 37.2 37.1 36.0 29.9* 28.1* 24.9* 28.4*  MCV 88.8 90.7 89.3 89.0 88.9 90.5 89.0  PLT 167 143* 161 136* 131* 112* 128*   PROTIME:  Recent Labs  04/23/14 1459  LABPROT 19.0*  INR 1.64*   Liver Function Tests: No results found for this basename: AST, ALT, ALKPHOS, BILITOT, PROT, ALBUMIN,  in the last 72 hours No results found for this basename: LIPASE, AMYLASE,  in the last 72 hours BNP: BNP (last 3 results)  Recent Labs  04/13/14 1646 04/19/14 1722 04/20/14 0408  PROBNP 4319.0* 3779.0* 3528.0*       ASSESSMENT AND PLAN:  Principal Problem:   Acute on chronic diastolic CHF (congestive heart failure) Active Problems:   Shortness of breath   Bradycardia, sinus   DDD (degenerative disc disease), thoracolumbar   Hypothyroidism   Atrial fibrillation   Coronary disease non obstucted    Hypertension   Chronic anticoagulation   Chronic renal disease, stage III   Acute exacerbation of CHF (congestive heart failure)   Stable  Agree with transfer  Hopefully home 24-48 hrs  Signed, Virl Axe MD  04/26/2014

## 2014-04-27 DIAGNOSIS — Z950 Presence of cardiac pacemaker: Secondary | ICD-10-CM

## 2014-04-27 DIAGNOSIS — D500 Iron deficiency anemia secondary to blood loss (chronic): Secondary | ICD-10-CM

## 2014-04-27 LAB — CBC
HCT: 29 % — ABNORMAL LOW (ref 36.0–46.0)
Hemoglobin: 9.3 g/dL — ABNORMAL LOW (ref 12.0–15.0)
MCH: 29 pg (ref 26.0–34.0)
MCHC: 32.1 g/dL (ref 30.0–36.0)
MCV: 90.3 fL (ref 78.0–100.0)
Platelets: 135 10*3/uL — ABNORMAL LOW (ref 150–400)
RBC: 3.21 MIL/uL — ABNORMAL LOW (ref 3.87–5.11)
RDW: 15.7 % — AB (ref 11.5–15.5)
WBC: 7.2 10*3/uL (ref 4.0–10.5)

## 2014-04-27 LAB — BASIC METABOLIC PANEL
BUN: 17 mg/dL (ref 6–23)
CO2: 24 mEq/L (ref 19–32)
CREATININE: 0.97 mg/dL (ref 0.50–1.10)
Calcium: 9.3 mg/dL (ref 8.4–10.5)
Chloride: 103 mEq/L (ref 96–112)
GFR, EST AFRICAN AMERICAN: 59 mL/min — AB (ref >90)
GFR, EST NON AFRICAN AMERICAN: 51 mL/min — AB (ref >90)
Glucose, Bld: 98 mg/dL (ref 70–99)
Potassium: 4.1 mEq/L (ref 3.7–5.3)
Sodium: 139 mEq/L (ref 137–147)

## 2014-04-27 MED ORDER — FUROSEMIDE 10 MG/ML IJ SOLN
40.0000 mg | Freq: Every day | INTRAMUSCULAR | Status: DC
  Administered 2014-04-27: 40 mg via INTRAVENOUS
  Filled 2014-04-27 (×2): qty 4

## 2014-04-27 NOTE — Progress Notes (Signed)
Patient complains of discomfort in left shoulder due to pressure from dressing.  Resting quietly at this time.

## 2014-04-27 NOTE — Progress Notes (Signed)
[facility] PROGRESS NOTE  [name] WUJ:[id number] DOB: Nov 04, 1925 DOA: 04/19/2014 PCP: [name], MD  Assessment/Plan: 1. Acute diastolic congestive heart failure. -Transthoracic echocardiogram performed on 02/26/2014 showing ejection fraction of 60-65% without wall motion abnormalities -She presents with clinical signs symptoms suggestive of acute CHF, with initial lab showed an elevated BNP of 3779 - She diuresed 1,600 mL 6/8; She initially received 40 mg of IV Lasix, was changed Lasix 40 mg PO q daily>> had episode of hypotension 6/9>> diuretics were then held 6/9 d/t hypotension as discussed below, cardiology to follow and resume when appropriate>> follow. -Patient remains on amiodarone, follow. -12 lb wt gain noted since admission likely d/t blood transfusion, IVF, holding diuretcs she has required d/t anemia and hypotension -lasix resume today per cards, she is assymptomatic-follow 2.  Dizziness -Patient reporting dizziness 6/9 likely resulting from hypotension and bradycardia>> was treated as discussed below  3.  Hypotension -Likely secondary to antihypertensive agents and diuresis -Systolic blood pressures in the 70's to 90's this morning and 6/9, likely explaining her dizziness.  -Coreg DC'd on 6/9 d/t hypotension and bradycardia -Patient required vasopressors on 6/9>> initially dopamine, changed to Neo-Synephrine (d/t persistent nausea vomiting) -Resolved status post temporal pacer initiated last p.m. per cards, and vasopressors discontinued -Improved, BP remaining stable  4. Chest Pain -Overnight 6/8-9 patient complaining of chest pain  -Had cath in march of 2015 which showed nonobstructive CAD. Cardiac enzymes were cycled and remained negative.  -Per cards  5.  Atrial fibrillation with symptomatic bradycardia /sick sinus syndrome -P.m. of 6/9 patient became bradycardic down to the 30s >> Coreg was discontinued and amiodarone held. -On followup per  cards last p.m. patient became symptomatic>> transvenous pacer was placed 6/9 -Permanent pacemaker placed  6/10 per cardiology -Patient remains on amiodarone, follow.  6. Anticoagulation - apixaban remains on hold today s/p pacemaker placement on 9/56 complicated by bleeding>> cards to follow and resume picks and appropriate.  7. Hypertension -Antihypertensives discontinued on 6/9 d/t hypotension as above -BP stable today, follow.  8. acute blood loss anemia, S/p Pacemaker with Bleeding complication -apixaban on hold per cards -hgb 7.8 from 9.4 6/11 and 11.3 on 6/10, and she is hypotensive as well 6/12 -status post transfusion 2 units of blood on 6/12, hgb remains stable- 9.3 today - 9. Probable pneumonia/HCAP -CT scan with bilateral lower lobe airspace consolidation -continue Levaquin and follow.  Code Status: Full Family Communication: None at bedside Disposition Plan: Transfer to telemetry  Procedures -Temporal transvenous pacemaker placement on 6/9 per Dr. Burt Knack -Per renal and pacemaker placement 6/10 Consultants  -Cardiology  HPI/Subjective: Pt denies CP, no shortness of breath. Per nsg staff 12lb wt gain noted since admission Objective: Filed Vitals:   04/27/14 0342  BP: 118/47  Pulse: 67  Temp: 98.2 F (36.8 C)  Resp: 19    Intake/Output Summary (Last 24 hours) at 04/27/14 1546 Last data filed at 04/27/14 1517  Gross per 24 hour  Intake    480 ml  Output   2201 ml  Net  -1721 ml   Filed Weights   04/24/14 0500 04/26/14 1426 04/27/14 0350  Weight: 70.9 kg (156 lb 4.9 oz) 74.6 kg (164 lb 7.4 oz) 73.8 kg (162 lb 11.2 oz)    Exam:   General:  s/p permanent pacemaker in no acute distress awake alert oriented x3  Cardiovascular: 2-6 systolic ejection murmur. Chest area with pressure dressing clean and dry  Respiratory: Decreased breath sounds at bases, no wheezes  Abdomen: Soft nontender nondistended  Musculoskeletal: trace edema   Data  Reviewed: Basic Metabolic Panel:  Recent Labs Lab 04/23/14 0423 04/24/14 0500 04/25/14 1100 04/26/14 0258 04/27/14 0435  NA 138 139 139 136* 139  K 4.5 4.0 4.7 4.1 4.1  CL 101 100 104 102 103  CO2 23 24 25 22 24   GLUCOSE 125* 127* 136* 113* 98  BUN 27* 27* 19 17 17   CREATININE 1.43* 1.24* 0.97 0.86 0.97  CALCIUM 9.1 9.1 8.9 8.9 9.3   Liver Function Tests: No results found for this basename: AST, ALT, ALKPHOS, BILITOT, PROT, ALBUMIN,  in the last 168 hours No results found for this basename: LIPASE, AMYLASE,  in the last 168 hours No results found for this basename: AMMONIA,  in the last 168 hours CBC:  Recent Labs Lab 04/24/14 0500 04/24/14 2022 04/25/14 0342 04/26/14 0258 04/27/14 0435  WBC 9.2 8.2 6.4 10.0 7.2  HGB 9.4* 8.9* 7.8* 9.2* 9.3*  HCT 29.9* 28.1* 24.9* 28.4* 29.0*  MCV 89.0 88.9 90.5 89.0 90.3  PLT 136* 131* 112* 128* 135*   Cardiac Enzymes:  Recent Labs Lab 04/20/14 2030 04/21/14 0104 04/21/14 0638  TROPONINI <0.30 <0.30 <0.30   BNP (last 3 results)  Recent Labs  04/13/14 1646 04/19/14 1722 04/20/14 0408  PROBNP 4319.0* 3779.0* 3528.0*   CBG:  Recent Labs Lab 04/25/14 2007 04/25/14 2337  GLUCAP 146* 127*    Recent Results (from the past 240 hour(s))  MRSA PCR SCREENING     Status: None   Collection Time    04/22/14  4:45 PM      Result Value Ref Range Status   MRSA by PCR NEGATIVE  NEGATIVE Final   Comment:            The GeneXpert MRSA Assay (FDA     approved for NASAL specimens     only), is one component of a     comprehensive MRSA colonization     surveillance program. It is not     intended to diagnose MRSA     infection nor to guide or     monitor treatment for     MRSA infections.     Studies: Ct Chest Wo Contrast  04/25/2014   CLINICAL DATA:  Evaluate for hemothorax. Failed pacemaker attempted.  EXAM: CT CHEST WITHOUT CONTRAST  TECHNIQUE: Multidetector CT imaging of the chest was performed following the standard  protocol without IV contrast.  COMPARISON:  None.  FINDINGS: Small bilateral, low attenuation pleural effusions are identified. There is airspace consolidation in both lower lobes. Mild increased interstitial markings noted bilaterally compatible with interstitial edema.  The heart size appears enlarged. There is a right chest wall pacer device with leads in the right atrial appendage and right ventricle. There is no pericardial effusion. Calcified atherosclerotic disease involves the thoracic aorta as well as the LAD and RCA coronary arteries. No mediastinal or hilar adenopathy identified.  Cavity containing high attenuation material in the left chest wall measures 6.1 x 3.5 cm and is presumed to represent pocket for pacemaker battery pack, image 14/series 2. There is subcutaneous fat stranding throughout the left breast and left chest wall with overlying skin thickening. No adenopathy identified.  Incidental imaging through the upper abdomen shows no acute findings. There is a cyst arising from the upper pole of the right kidney measuring 2.7 cm.  Review of the visualized osseous structures shows scoliosis deformity and multi level degenerative disc disease.  IMPRESSION: 1. No evidence  for hemothorax. 2. Bilateral pleural effusions and mild interstitial edema. 3. Bilateral lower lobe airspace consolidation. 4. Cardiac enlargement, atherosclerotic disease, and multi vessel coronary artery calcifications. 5. Left chest wall cavity containing high attenuation material is presumed to represent the surgical site of the attempted pacer placement. There is a left chest wall subcutaneous soft tissue infiltration and skin thickening which may reflect cellulitis.   Electronically Signed   By: Kerby Moors M.D.   On: 04/25/2014 17:22    Scheduled Meds: . amiodarone  200 mg Oral Daily  . furosemide  40 mg Intravenous Daily  . levofloxacin (LEVAQUIN) IV  750 mg Intravenous Q48H  . senna  1 tablet Oral Daily  . sodium  chloride  3 mL Intravenous Q12H   Continuous Infusions:    Principal Problem:   Acute on chronic diastolic CHF (congestive heart failure) Active Problems:   Shortness of breath   Bradycardia, sinus   DDD (degenerative disc disease), thoracolumbar   Hypothyroidism   Atrial fibrillation   Coronary disease non obstucted    Hypertension   Chronic anticoagulation   Chronic renal disease, stage III   Pacemaker   Blood loss anemia    Time spent: 25 min    [facility] Pager [phone]. If 7PM-7AM, please contact night-coverage at [email or web address], password [email or web address] 04/27/2014, 3:46 PM  LOS: 8 days

## 2014-04-27 NOTE — Progress Notes (Signed)
Patient ID: [name], female   DOB: 02/25/1925, 80 y.o.   MRN: [id number]    SUBJECTIVE:  The patient has some mild discomfort in her left arm after her pacemaker. This is mild. She is having increased shortness of breath. Chart review shows that she has been on a diuretic at home. She had bleeding with her pacemaker and she was hypotensive. With this her diuretics were put on hold. She does not have edema but she has gained over 10 pounds since being here.   Filed Vitals:   04/26/14 2052 04/27/14 0200 04/27/14 0342 04/27/14 0350  BP: 101/44 121/50 118/47   Pulse: 61 66 67   Temp: 98.7 F (37.1 C) 97.4 F (36.3 C) 98.2 F (36.8 C)   TempSrc: Oral Oral Oral   Resp: 18 19 19    Height:      Weight:    162 lb 11.2 oz (73.8 kg)  SpO2: 91% 100% 100%      Intake/Output Summary (Last 24 hours) at 04/27/14 1227 Last data filed at 04/27/14 0900  Gross per 24 hour  Intake    555 ml  Output   1600 ml  Net  -1045 ml    LABS: Basic Metabolic Panel:  Recent Labs  04/26/14 0258 04/27/14 0435  NA 136* 139  K 4.1 4.1  CL 102 103  CO2 22 24  GLUCOSE 113* 98  BUN 17 17  CREATININE 0.86 0.97  CALCIUM 8.9 9.3   Liver Function Tests: No results found for this basename: AST, ALT, ALKPHOS, BILITOT, PROT, ALBUMIN,  in the last 72 hours No results found for this basename: LIPASE, AMYLASE,  in the last 72 hours CBC:  Recent Labs  04/26/14 0258 04/27/14 0435  WBC 10.0 7.2  HGB 9.2* 9.3*  HCT 28.4* 29.0*  MCV 89.0 90.3  PLT 128* 135*   Cardiac Enzymes: No results found for this basename: CKTOTAL, CKMB, CKMBINDEX, TROPONINI,  in the last 72 hours BNP: No components found with this basename: POCBNP,  D-Dimer: No results found for this basename: DDIMER,  in the last 72 hours Hemoglobin A1C: No results found for this basename: HGBA1C,  in the last 72 hours Fasting Lipid Panel: No results found for this basename: CHOL, HDL, LDLCALC, TRIG, CHOLHDL, LDLDIRECT,  in the last 72  hours Thyroid Function Tests: No results found for this basename: TSH, T4TOTAL, FREET3, T3FREE, THYROIDAB,  in the last 72 hours  RADIOLOGY: Dg Chest 2 View  04/19/2014   CLINICAL DATA:  Shortness of breath and wheezing  EXAM: CHEST  2 VIEW  COMPARISON:  Apr 13, 2014  FINDINGS: There remains cardiomegaly mild pulmonary venous hypertension. There is a mild degree of interstitial edema, minimally less than on recent prior study. There is no airspace consolidation. No new opacity. No adenopathy. There is thoracolumbar levoscoliosis. There is postoperative change in the right shoulder.  IMPRESSION: Evidence of a degree of congestive heart failure with marginally less edema compared to recent prior study. No airspace consolidation.   Electronically Signed   By: Lowella Grip M.D.   On: 04/19/2014 19:03   Dg Chest 2 View  04/13/2014   CLINICAL DATA:  Short of breath.  EXAM: CHEST  2 VIEW  COMPARISON:  02/24/2014  FINDINGS: Cardiac silhouette is mildly enlarged. Normal mediastinal contours. No hilar masses.  There are irregularly thickened interstitial markings bilaterally similar to the prior exam. No focal lung consolidation. No pleural effusion or pneumothorax.  The bony thorax is demineralized  but grossly intact.  IMPRESSION: Cardiomegaly with diffuse bilateral interstitial thickening suggests congestive heart failure. No focal consolidation to suggest lobar pneumonia.   Electronically Signed   By: Lajean Manes M.D.   On: 04/13/2014 17:06   Ct Chest Wo Contrast  04/25/2014   CLINICAL DATA:  Evaluate for hemothorax. Failed pacemaker attempted.  EXAM: CT CHEST WITHOUT CONTRAST  TECHNIQUE: Multidetector CT imaging of the chest was performed following the standard protocol without IV contrast.  COMPARISON:  None.  FINDINGS: Small bilateral, low attenuation pleural effusions are identified. There is airspace consolidation in both lower lobes. Mild increased interstitial markings noted bilaterally compatible  with interstitial edema.  The heart size appears enlarged. There is a right chest wall pacer device with leads in the right atrial appendage and right ventricle. There is no pericardial effusion. Calcified atherosclerotic disease involves the thoracic aorta as well as the LAD and RCA coronary arteries. No mediastinal or hilar adenopathy identified.  Cavity containing high attenuation material in the left chest wall measures 6.1 x 3.5 cm and is presumed to represent pocket for pacemaker battery pack, image 14/series 2. There is subcutaneous fat stranding throughout the left breast and left chest wall with overlying skin thickening. No adenopathy identified.  Incidental imaging through the upper abdomen shows no acute findings. There is a cyst arising from the upper pole of the right kidney measuring 2.7 cm.  Review of the visualized osseous structures shows scoliosis deformity and multi level degenerative disc disease.  IMPRESSION: 1. No evidence for hemothorax. 2. Bilateral pleural effusions and mild interstitial edema. 3. Bilateral lower lobe airspace consolidation. 4. Cardiac enlargement, atherosclerotic disease, and multi vessel coronary artery calcifications. 5. Left chest wall cavity containing high attenuation material is presumed to represent the surgical site of the attempted pacer placement. There is a left chest wall subcutaneous soft tissue infiltration and skin thickening which may reflect cellulitis.   Electronically Signed   By: Kerby Moors M.D.   On: 04/25/2014 17:22   Dg Chest Port 1 View  04/24/2014   CLINICAL DATA:  Hypotension.  EXAM: PORTABLE CHEST - 1 VIEW  COMPARISON:  Chest x-ray 04/23/2014.  FINDINGS: Cardiomegaly with diffuse bilateral pulmonary interstitial prominence noted consistent with congestive heart failure and pulmonary interstitial edema. Bilateral pleural effusions. Cardiac pacer noted with lead tips in right atrium and right ventricle.  IMPRESSION: 1. Congestive heart  failure with pulmonary interstitial edema and bilateral pleural effusions.  2. Cardiac pacer with lead tips in the right atrium and right ventricle.   Electronically Signed   By: Marcello Moores  Register   On: 04/24/2014 08:36   Dg Chest Portable 1 View  04/23/2014   CLINICAL DATA:  Cardiac pacemaker implant.  Rule out pneumothorax.  EXAM: PORTABLE CHEST - 1 VIEW  COMPARISON:  04/19/2014  FINDINGS: A right dual lead cardiac pacemaker has been placed. Leads in the region of the right atrium and right ventricle. No evidence for a pneumothorax. Coarse lung markings suggest chronic changes. Hazy densities in the right lung suggest mild edema or atelectasis. There is volume loss or consolidation at the left lung base. Prominent linear densities along the periphery of the left lung could be related to interstitial edema.  IMPRESSION: Placement of right cardiac pacemaker.  Negative for a pneumothorax.  Densities at the left lung base are suggestive for atelectasis.  Probable mild edema.   Electronically Signed   By: Markus Daft M.D.   On: 04/23/2014 20:44    PHYSICAL EXAM  the patient is stable in a chair. I've spoken with her daughter in the room. She is oriented to person time and place. Affect is normal. The pacemaker is bandaged and stable. Cardiac exam reveals S1 and S2. Lungs reveal diffuse rales. The abdomen is soft. There is no significant peripheral edema.   TELEMETRY: I have reviewed telemetry today April 27, 2014. The rhythm is paced.   ASSESSMENT AND PLAN:    Acute on chronic diastolic CHF (congestive heart failure)    The patient is a little worse today. We have not restarted her diuretics because she was hypotensive initially when her pacemaker was placed. She has gained fluid and needs to be diuresis at this time. I started her on IV Lasix daily. I've ordered strict I and O. I will order chemistry for tomorrow.    Atrial fibrillation     The patient has underlying atrial fibrillation. The plan would  be to restart her anticoagulation. However I have not yet done this because of her significant blood loss.    Chronic anticoagulation      Anticoagulation has been held since she bled at the time of her pacemaker. He needs to be restarted. We need to be sure that her overall status is stable.    Chronic renal disease, stage III     Renal function is being followed carefully.    Pacemaker     The patient now has a new permanent pacemaker that was placed on April 23, 2014. There was significant bleeding at that time. Pacemaker is working well now.    Blood loss anemia     Patient developed significant blood loss anemia when she lost blood related to her pacemaker placement. She was actually transfused one unit. Her hemoglobin came back up to 9.3. He will be checked again tomorrow. If this appears to be stable and her pacer site is stable, consideration can be given to restarting her anticoagulation.  The patient's daughter mentions that she will try to speak with case management and the next day or so to begin to discuss eventual discharge planning.   Dola Argyle 04/27/2014 12:27 PM

## 2014-04-28 DIAGNOSIS — D500 Iron deficiency anemia secondary to blood loss (chronic): Secondary | ICD-10-CM

## 2014-04-28 LAB — CBC
HCT: 29.7 % — ABNORMAL LOW (ref 36.0–46.0)
Hemoglobin: 9.4 g/dL — ABNORMAL LOW (ref 12.0–15.0)
MCH: 28.4 pg (ref 26.0–34.0)
MCHC: 31.6 g/dL (ref 30.0–36.0)
MCV: 89.7 fL (ref 78.0–100.0)
Platelets: 159 10*3/uL (ref 150–400)
RBC: 3.31 MIL/uL — AB (ref 3.87–5.11)
RDW: 16 % — ABNORMAL HIGH (ref 11.5–15.5)
WBC: 6.3 10*3/uL (ref 4.0–10.5)

## 2014-04-28 LAB — BASIC METABOLIC PANEL
BUN: 17 mg/dL (ref 6–23)
CO2: 27 mEq/L (ref 19–32)
Calcium: 9.1 mg/dL (ref 8.4–10.5)
Chloride: 104 mEq/L (ref 96–112)
Creatinine, Ser: 0.95 mg/dL (ref 0.50–1.10)
GFR, EST AFRICAN AMERICAN: 61 mL/min — AB (ref >90)
GFR, EST NON AFRICAN AMERICAN: 52 mL/min — AB (ref >90)
Glucose, Bld: 95 mg/dL (ref 70–99)
Potassium: 3.6 mEq/L — ABNORMAL LOW (ref 3.7–5.3)
SODIUM: 140 meq/L (ref 137–147)

## 2014-04-28 MED ORDER — FUROSEMIDE 10 MG/ML IJ SOLN
40.0000 mg | Freq: Two times a day (BID) | INTRAMUSCULAR | Status: DC
  Administered 2014-04-28 – 2014-04-30 (×5): 40 mg via INTRAVENOUS
  Filled 2014-04-28 (×7): qty 4

## 2014-04-28 NOTE — Evaluation (Signed)
Physical Therapy Evaluation Patient Details Name: [name] MRN: [id number] DOB: 1925-08-29 Today's Date: 04/28/2014   History of Present Illness  Pt is 80 yo female admiited due to sudden onset of dyspnea while watching tv. Pt recently admitted witha ctue on chronic diastolic CHF exacerbation d/c'd home 04/14/14. Pt with PMH of HTN, chest pain, DJD, thrombocytopenia, aneamia, CHF and Afib. Pt now s/p pacemaker placement on 04/23/14.   Clinical Impression  Pt admitted with the above. Pt currently with functional limitations due to the deficits listed below (see PT Problem List). At the time of PT eval, pt was able to ambulate a fair distance (55' in hall, 20' to bathroom). Pt reports she cannot function alone at home at this time, and that her and her daughter have discussed rehab at d/c prior to returning to her home. PT in agreement, and feel pt will greatly benefit from short term rehab in the SNF setting before transitioning back to independent living. Pt will benefit from skilled PT to increase their independence and safety with mobility to allow discharge to the venue listed below.     Follow Up Recommendations SNF;Supervision/Assistance - 24 hour    Equipment Recommendations  Rolling walker with 5" wheels (Youth height)    Recommendations for Other Services       Precautions / Restrictions Precautions Precautions: Fall;ICD/Pacemaker Restrictions Weight Bearing Restrictions: No      Mobility  Bed Mobility Overal bed mobility: Needs Assistance Bed Mobility: Supine to Sit     Supine to sit: Min assist     General bed mobility comments: PT able to use bed rails for support, with  min assist for trunk elevation to sitting position on EOB. Able to scoot hips around well.   Transfers Overall transfer level: Needs assistance Equipment used: Rolling walker (2 wheeled) Transfers: Sit to/from Stand Sit to Stand: Min guard         General transfer comment: Increased  time to achieve full standing. Pt reports mild dizziness that resolves ~20 sec  Ambulation/Gait Ambulation/Gait assistance: Min guard Ambulation Distance (Feet): 70 Feet Assistive device: Rolling walker (2 wheeled) Gait Pattern/deviations: Step-through pattern;Decreased stride length;Trunk flexed Gait velocity: Decreased Gait velocity interpretation: Below normal speed for age/gender General Gait Details: Pt would benefit from youth sized walker. Overall demonstrated good safety awareness with little unsteadiness noted. VC's for breathing techniques.   Stairs            Wheelchair Mobility    Modified Rankin (Stroke Patients Only)       Balance Overall balance assessment: History of Falls;No apparent balance deficits (not formally assessed)                                           Pertinent Vitals/Pain Pt on RA throughout session. After gait training, sats at 97% after seated rest break.    Home Living Family/patient expects to be discharged to:: Private residence Living Arrangements: Alone Available Help at Discharge: Family;Available PRN/intermittently Type of Home: House Home Access: Stairs to enter Entrance Stairs-Rails: Can reach both;Left;Right Entrance Stairs-Number of Steps: 4 Home Layout: One level Home Equipment: Cane - single point;Grab bars - tub/shower;Grab bars - toilet      Prior Function Level of Independence: Independent with assistive device(s)         Comments: Still driving, Doing minimal grocery shopping with daughter  Hand Dominance   Dominant Hand: Right    Extremity/Trunk Assessment   Upper Extremity Assessment: Overall WFL for tasks assessed           Lower Extremity Assessment: Overall WFL for tasks assessed      Cervical / Trunk Assessment: Kyphotic  Communication   Communication: No difficulties  Cognition Arousal/Alertness: Awake/alert Behavior During Therapy: WFL for tasks  assessed/performed Overall Cognitive Status: Within Functional Limits for tasks assessed                      General Comments      Exercises        Assessment/Plan    PT Assessment Patient needs continued PT services  PT Diagnosis Difficulty walking;Generalized weakness   PT Problem List Decreased strength;Decreased activity tolerance;Decreased mobility;Decreased balance;Decreased knowledge of use of DME;Decreased safety awareness;Decreased knowledge of precautions  PT Treatment Interventions DME instruction;Gait training;Stair training;Functional mobility training;Therapeutic activities;Therapeutic exercise;Neuromuscular re-education;Patient/family education   PT Goals (Current goals can be found in the Care Plan section) Acute Rehab PT Goals Patient Stated Goal: To return home after rehab PT Goal Formulation: With patient Time For Goal Achievement: 04/28/14 Potential to Achieve Goals: Good    Frequency Min 2X/week   Barriers to discharge Decreased caregiver support      Co-evaluation               End of Session Equipment Utilized During Treatment: Gait belt Activity Tolerance: Patient tolerated treatment well Patient left: Other (comment);with call bell/phone within reach (In bathroom on toilet. NT notified. ) Nurse Communication: Mobility status         Time: 9470-9628 PT Time Calculation (min): 31 min   Charges:   PT Evaluation $Initial PT Evaluation Tier I: 1 Procedure PT Treatments $Therapeutic Activity: 23-37 mins   PT G Codes:          Jolyn Lent 04/28/2014, 9:58 AM  Jolyn Lent, PT, DPT Acute Rehabilitation Services Pager: [phone]

## 2014-04-28 NOTE — Progress Notes (Signed)
Pt transferred to [facility]. CSW provided unit CSW a handoff. This CSW signing off.   Ky Barban, MSW, [facility] Social Worker

## 2014-04-28 NOTE — Progress Notes (Signed)
Patient Name: [name]      SUBJECTIVE:  Shortness of breath yesterday with volume overload,  Diuretics resumed Weak    Past Medical History  Diagnosis Date  . Dyslipidemia   . Hypertension   . Palpitations     PVCs/bigeminy on event in May 2009 revealing this was relatively asymptomatic  . Chest pain     a. 2011 Neg MV;  b. 01/2014 Cath: LM 20-30, LAD nl, D1 nl, LCX nl, OM1 nl, RCA dom 30-58m, PD/PL nl, EF 65%->Med Rx.  . Degenerative joint disease   . Hx of varicella   . History of skin cancer     tafeen/ non melanoma.   . Thrombocytopenia   . Anemia   . Monoclonal gammopathies   . Right lower quadrant abdominal pain 07/19/2012    Recurrent  With nausea    This is new since she had ct for llq pain in [facility]    R/o appendiceal problem  Hernia  Get ct scan  And plan  Fu     . Diastolic CHF, acute on chronic 01/31/2012  . Atrial fibrillation     a. Dx 01/2014->Eliquis started.    Scheduled Meds:  Scheduled Meds: . amiodarone  200 mg Oral Daily  . furosemide  40 mg Intravenous Daily  . levofloxacin (LEVAQUIN) IV  750 mg Intravenous Q48H  . senna  1 tablet Oral Daily  . sodium chloride  3 mL Intravenous Q12H   Continuous Infusions:  acetaminophen, bisacodyl, bisacodyl, ondansetron (ZOFRAN) IV    PHYSICAL EXAM Filed Vitals:   04/27/14 1600 04/27/14 2311 04/28/14 0119 04/28/14 0446  BP: 111/37 113/43 120/45 128/43  Pulse: 62 66 64 66  Temp: 98 F (36.7 C) 97.8 F (36.6 C) 97.5 F (36.4 C) 97.9 F (36.6 C)  TempSrc: Oral Oral Oral Oral  Resp: 18 19 19 18   Height:      Weight:    160 lb (72.576 kg)  SpO2: 98% 100% 96% 100%    General appearance: alert, cooperative and mild distress Neck: JVD - 8 cm above sternal notch Lungs: rales bilaterally Heart: regular rate and rhythm and systolic murmur: early systolic 2/6, crescendo and decrescendo at 2nd right intercostal space Abdomen: soft, non-tender; bowel sounds normal; no masses,  no  organomegaly Extremities: edema 1 Skin: Skin color, texture, turgor normal. No rashes or lesions Neurologic: Alert and oriented X 3, normal strength and tone. Normal symmetric reflexes. Normal coordination and gait  TELEMETRY:: av pacing    Intake/Output Summary (Last 24 hours) at 04/28/14 0803 Last data filed at 04/28/14 0233  Gross per 24 hour  Intake    480 ml  Output   2301 ml  Net  -1821 ml    LABS: Basic Metabolic Panel:  Recent Labs Lab 04/22/14 0356 04/23/14 0423 04/24/14 0500 04/25/14 1100 04/26/14 0258 04/27/14 0435 04/28/14 0430  NA 140 138 139 139 136* 139 140  K 4.3 4.5 4.0 4.7 4.1 4.1 3.6*  CL 99 101 100 104 102 103 104  CO2 30 23 24 25 22 24 27   GLUCOSE 93 125* 127* 136* 113* 98 95  BUN 27* 27* 27* 19 17 17 17   CREATININE 1.65* 1.43* 1.24* 0.97 0.86 0.97 0.95  CALCIUM 9.5 9.1 9.1 8.9 8.9 9.3 9.1   Cardiac Enzymes: No results found for this basename: CKTOTAL, CKMB, CKMBINDEX, TROPONINI,  in the last 72 hours CBC:  Recent Labs Lab 04/23/14 1459 04/24/14 0500 04/24/14  2022 04/25/14 0342 04/26/14 0258 04/27/14 0435 04/28/14 0430  WBC 7.4 9.2 8.2 6.4 10.0 7.2 6.3  HGB 11.3* 9.4* 8.9* 7.8* 9.2* 9.3* 9.4*  HCT 36.0 29.9* 28.1* 24.9* 28.4* 29.0* 29.7*  MCV 89.3 89.0 88.9 90.5 89.0 90.3 89.7  PLT 161 136* 131* 112* 128* 135* 159   PROTIME: No results found for this basename: LABPROT, INR,  in the last 72 hours Liver Function Tests: No results found for this basename: AST, ALT, ALKPHOS, BILITOT, PROT, ALBUMIN,  in the last 72 hours No results found for this basename: LIPASE, AMYLASE,  in the last 72 hours BNP: BNP (last 3 results)  Recent Labs  04/13/14 1646 04/19/14 1722 04/20/14 0408  PROBNP 4319.0* 3779.0* 3528.0*   D-Dimer:    ASSESSMENT AND PLAN:  Principal Problem:   Acute on chronic diastolic CHF (congestive heart failure) Active Problems:   Shortness of breath   Bradycardia, sinus   DDD (degenerative disc disease),  thoracolumbar   Hypothyroidism   Atrial fibrillation   Coronary disease non obstucted    Hypertension   Chronic anticoagulation   Chronic renal disease, stage III   Pacemaker   Blood loss anemia  contniue ( increase )  IV diuresis OT and PT for mobilization  Hope ready for home 24-48 hrs   Signed, Virl Axe MD  04/28/2014

## 2014-04-28 NOTE — Progress Notes (Signed)
I have read and agree with this note.   [place], OTR/L [phone]

## 2014-04-28 NOTE — Progress Notes (Signed)
OT Cancellation Note  Patient Details Name: [name] MRN: [id number] DOB: 1925-04-22   Cancelled Treatment:    Reason Eval/Treat Not Completed: Pt's current D/C plan is SNF. No apparent immediate acute care OT needs, therefore will defer OT to SNF. If OT eval is needed please call Acute Rehab Dept. at [phone] or text page OT at [phone].   Lyda Perone, [place] 04/28/2014, 11:39 AM

## 2014-04-28 NOTE — Progress Notes (Signed)
[facility] PROGRESS NOTE  [name] AYT:[id number] DOB: 1925/01/27 DOA: 04/19/2014 PCP: [name], MD  Assessment/Plan: 1. Acute diastolic congestive heart failure. -Transthoracic echocardiogram performed on 02/26/2014 showing ejection fraction of 60-65% without wall motion abnormalities -She presents with clinical signs symptoms suggestive of acute CHF, with initial lab showed an elevated BNP of 3779 - She diuresed 1,600 mL 6/8; She initially received 40 mg of IV Lasix, was changed Lasix 40 mg PO q daily>> had episode of hypotension 6/9>> diuretics were then held 6/9 d/t hypotension as discussed below, cardiology to follow and resume when appropriate>> follow. -Patient remains on amiodarone, follow. -12 lb wt gain noted since admission likely d/t blood transfusion, IVF, holding diuretcs she has required d/t anemia and hypotension -lasix resume today per cards, she is assymptomatic-follow>> about 1.5L out overnight, follow -Noted per cards home in 24-48 hours 2.  Dizziness -Patient reporting dizziness 6/9 likely resulting from hypotension and bradycardia>> was treated as discussed below -Follow and check orthostatics 3.  Hypotension -Likely secondary to antihypertensive agents and diuresis -Systolic blood pressures in the 70's to 90's this morning and 6/9, likely explaining her dizziness.  -Coreg DC'd on 6/9 d/t hypotension and bradycardia -Patient required vasopressors on 6/9>> initially dopamine, changed to Neo-Synephrine (d/t persistent nausea vomiting) -Resolved status post temporal pacer initiated last p.m. per cards, and vasopressors discontinued -Improved, BP remaining stable  4. Chest Pain -Overnight 6/8-9 patient complaining of chest pain  -Had cath in march of 2015 which showed nonobstructive CAD. Cardiac enzymes were cycled and remained negative.  -Per cards  5.  Atrial fibrillation with symptomatic bradycardia /sick sinus syndrome -P.m. of 6/9  patient became bradycardic down to the 30s >> Coreg was discontinued and amiodarone held. -On followup per cards last p.m. patient became symptomatic>> transvenous pacer was placed 6/9 -Permanent pacemaker placed  6/10 per cardiology -Patient remains on amiodarone, follow.  6. Anticoagulation - apixaban remains on hold today s/p pacemaker placement on 3/55 complicated by bleeding>> cards to follow and resume picks and appropriate.  7. Hypertension -Antihypertensives discontinued on 6/9 d/t hypotension as above -BP stable today, follow.  8. acute blood loss anemia, S/p Pacemaker with Bleeding complication -apixaban on hold per cards -hgb 7.8 from 9.4 6/11 and 11.3 on 6/10, and she is hypotensive as well 6/12 -status post transfusion 2 units of blood on 6/12, hgb remains stable- 9.3 today - 9. Probable pneumonia/HCAP -CT scan with bilateral lower lobe airspace consolidation -continue Levaquin and follow.  Code Status: Full Family Communication: None at bedside Disposition Plan: Transfer to telemetry  Procedures -Temporal transvenous pacemaker placement on 6/9 per Dr. Burt Knack -Per renal and pacemaker placement 6/10 Consultants  -Cardiology  HPI/Subjective: Pt denies shortness of breath. Complaining of some dizziness with standing and reports brief episode of chest pain this a.m. Objective: Filed Vitals:   04/28/14 0446  BP: 128/43  Pulse: 66  Temp: 97.9 F (36.6 C)  Resp: 18    Intake/Output Summary (Last 24 hours) at 04/28/14 1122 Last data filed at 04/28/14 0852  Gross per 24 hour  Intake    600 ml  Output   2001 ml  Net  -1401 ml   Filed Weights   04/26/14 1426 04/27/14 0350 04/28/14 0446  Weight: 74.6 kg (164 lb 7.4 oz) 73.8 kg (162 lb 11.2 oz) 72.576 kg (160 lb)    Exam:   General:  s/p permanent pacemaker in no acute distress awake alert oriented x3  Cardiovascular: 2-6 systolic ejection murmur. Chest  area with dressing clean and dry  Respiratory:  Decreased breath sounds at bases, no wheezes  Abdomen: Soft nontender nondistended  Musculoskeletal: trace edema   Data Reviewed: Basic Metabolic Panel:  Recent Labs Lab 04/24/14 0500 04/25/14 1100 04/26/14 0258 04/27/14 0435 04/28/14 0430  NA 139 139 136* 139 140  K 4.0 4.7 4.1 4.1 3.6*  CL 100 104 102 103 104  CO2 24 25 22 24 27   GLUCOSE 127* 136* 113* 98 95  BUN 27* 19 17 17 17   CREATININE 1.24* 0.97 0.86 0.97 0.95  CALCIUM 9.1 8.9 8.9 9.3 9.1   Liver Function Tests: No results found for this basename: AST, ALT, ALKPHOS, BILITOT, PROT, ALBUMIN,  in the last 168 hours No results found for this basename: LIPASE, AMYLASE,  in the last 168 hours No results found for this basename: AMMONIA,  in the last 168 hours CBC:  Recent Labs Lab 04/24/14 2022 04/25/14 0342 04/26/14 0258 04/27/14 0435 04/28/14 0430  WBC 8.2 6.4 10.0 7.2 6.3  HGB 8.9* 7.8* 9.2* 9.3* 9.4*  HCT 28.1* 24.9* 28.4* 29.0* 29.7*  MCV 88.9 90.5 89.0 90.3 89.7  PLT 131* 112* 128* 135* 159   Cardiac Enzymes: No results found for this basename: CKTOTAL, CKMB, CKMBINDEX, TROPONINI,  in the last 168 hours BNP (last 3 results)  Recent Labs  04/13/14 1646 04/19/14 1722 04/20/14 0408  PROBNP 4319.0* 3779.0* 3528.0*   CBG:  Recent Labs Lab 04/25/14 2007 04/25/14 2337  GLUCAP 146* 127*    Recent Results (from the past 240 hour(s))  MRSA PCR SCREENING     Status: None   Collection Time    04/22/14  4:45 PM      Result Value Ref Range Status   MRSA by PCR NEGATIVE  NEGATIVE Final   Comment:            The GeneXpert MRSA Assay (FDA     approved for NASAL specimens     only), is one component of a     comprehensive MRSA colonization     surveillance program. It is not     intended to diagnose MRSA     infection nor to guide or     monitor treatment for     MRSA infections.     Studies: No results found.  Scheduled Meds: . amiodarone  200 mg Oral Daily  . furosemide  40 mg Intravenous  BID  . levofloxacin (LEVAQUIN) IV  750 mg Intravenous Q48H  . senna  1 tablet Oral Daily  . sodium chloride  3 mL Intravenous Q12H   Continuous Infusions:    Principal Problem:   Acute on chronic diastolic CHF (congestive heart failure) Active Problems:   Shortness of breath   Bradycardia, sinus   DDD (degenerative disc disease), thoracolumbar   Hypothyroidism   Atrial fibrillation   Coronary disease non obstucted    Hypertension   Chronic anticoagulation   Chronic renal disease, stage III   Pacemaker   Blood loss anemia    Time spent: 25 min    [place] [facility] Pager [phone]. If 7PM-7AM, please contact night-coverage at [email or web address], password [facility] 04/28/2014, 11:22 AM  LOS: 9 days

## 2014-04-29 LAB — BASIC METABOLIC PANEL
BUN: 15 mg/dL (ref 6–23)
CO2: 25 meq/L (ref 19–32)
Calcium: 9.7 mg/dL (ref 8.4–10.5)
Chloride: 100 mEq/L (ref 96–112)
Creatinine, Ser: 0.91 mg/dL (ref 0.50–1.10)
GFR calc Af Amer: 64 mL/min — ABNORMAL LOW (ref >90)
GFR, EST NON AFRICAN AMERICAN: 55 mL/min — AB (ref >90)
GLUCOSE: 92 mg/dL (ref 70–99)
POTASSIUM: 3.7 meq/L (ref 3.7–5.3)
Sodium: 140 mEq/L (ref 137–147)

## 2014-04-29 LAB — TROPONIN I

## 2014-04-29 MED ORDER — LORATADINE 10 MG PO TABS
10.0000 mg | ORAL_TABLET | Freq: Every day | ORAL | Status: DC
  Administered 2014-04-29 – 2014-05-02 (×4): 10 mg via ORAL
  Filled 2014-04-29 (×4): qty 1

## 2014-04-29 MED ORDER — LORAZEPAM 1 MG PO TABS
1.0000 mg | ORAL_TABLET | Freq: Once | ORAL | Status: AC
  Administered 2014-04-29: 1 mg via ORAL
  Filled 2014-04-29: qty 1

## 2014-04-29 NOTE — Clinical Social Work Placement (Addendum)
    Clinical Social Work Department CLINICAL SOCIAL WORK PLACEMENT NOTE 05/02/2014  Patient:  AKEILA, LANA  Account Number:  [id number] Admit date:  04/19/2014  Clinical Social Worker:  Butch Penny CROWDER, LCSWA  Date/time:  04/29/2014 01:58 PM  Clinical Social Work is seeking post-discharge placement for this patient at the following level of care:   SKILLED NURSING   (*CSW will update this form in Epic as items are completed)   04/29/2014  Patient/family provided with [place] Department of Clinical Social Work's list of facilities offering this level of care within the geographic area requested by the patient (or if unable, by the patient's family).  04/29/2014  Patient/family informed of their freedom to choose among providers that offer the needed level of care, that participate in Medicare, Medicaid or managed care program needed by the patient, have an available bed and are willing to accept the patient.  04/29/2014  Patient/family informed of [facility]' ownership interest in [facility], as well as of the fact that they are under no obligation to receive care at this facility.  PASARR submitted to EDS on 04/29/2014 PASARR number received on 04/29/2014  FL2 transmitted to all facilities in geographic area requested by pt/family on  04/29/2014 FL2 transmitted to all facilities within larger geographic area on   Patient informed that his/her managed care company has contracts with or will negotiate with  certain facilities, including the following:   [place] received     Patient/family informed of bed offers received:  05/01/2014 Patient chooses bed at [place] Physician recommends and patient chooses bed at    Patient to be transferred to [place] on  05/02/2014 Patient to be transferred to facility by Ambulance  [facility]) Patient and family notified of transfer on 05/02/2014 Name of family  member notified:  Sherron Monday- Daughter  The following physician request were entered in Epic: Physician Request  Please sign FL2.  Please prepare priority discharge summary and prescriptions.  Please prepare priority discharge summary, including medications    Additional Comments:05/02/14  DC today to SNF at [facility] per [place] of [place]. Patient and daughter are aware and agreeable to d/c. This was facility of choice.  Patient is hopeful to rehab and return home.  Daughter was happy about d/c arrangement and denied any concerns.  Nursing to call report. CSW signing off.

## 2014-04-29 NOTE — Progress Notes (Signed)
Patient complaining of intermittent left-sided chest pain/pressure.  VSS.  Dr. Dillard Essex made aware, received orders for EKG and troponin and to let cardiology know.  EKG obtained. Brooke from cardiology made aware.  Will continue to monitor patient.

## 2014-04-29 NOTE — Progress Notes (Signed)
ANTIBIOTIC CONSULT NOTE - FOLLOW UP  Pharmacy Consult for Levaquin Indication: pneumonia  No Known Allergies  Patient Measurements: Height: 4\' 10"  (147.3 cm) Weight: 157 lb 1.6 oz (71.26 kg) (Scale A) IBW/kg (Calculated) : 40.9  Vital Signs: Temp: 97.4 F (36.3 C) (06/16 0604) Temp src: Oral (06/16 0604) BP: 132/42 mmHg (06/16 0809) Pulse Rate: 61 (06/16 0809) Intake/Output from previous day: 06/15 0701 - 06/16 0700 In: 1200 [P.O.:1200] Out: 1425 [Urine:1425] Intake/Output from this shift: Total I/O In: 243 [P.O.:240; I.V.:3] Out: 500 [Urine:500]  Labs:  Recent Labs  04/27/14 0435 04/28/14 0430 04/29/14 0356  WBC 7.2 6.3  --   HGB 9.3* 9.4*  --   PLT 135* 159  --   CREATININE 0.97 0.95 0.91   Estimated Creatinine Clearance: 36.5 ml/min (by C-G formula based on Cr of 0.91). No results found for this basename: VANCOTROUGH, Corlis Leak, VANCORANDOM, [place], GENTPEAK, GENTRANDOM, TOBRATROUGH, TOBRAPEAK, TOBRARND, AMIKACINPEAK, AMIKACINTROU, AMIKACIN,  in the last 72 hours   Microbiology: Recent Results (from the past 720 hour(s))  MRSA PCR SCREENING     Status: None   Collection Time    04/22/14  4:45 PM      Result Value Ref Range Status   MRSA by PCR NEGATIVE  NEGATIVE Final   Comment:            The GeneXpert MRSA Assay (FDA     approved for NASAL specimens     only), is one component of a     comprehensive MRSA colonization     surveillance program. It is not     intended to diagnose MRSA     infection nor to guide or     monitor treatment for     MRSA infections.    Anti-infectives   Start     Dose/Rate Route Frequency Ordered Stop   04/26/14 1045  levofloxacin (LEVAQUIN) IVPB 750 mg     750 mg 100 mL/hr over 90 Minutes Intravenous Every 48 hours 04/26/14 1039     04/23/14 1930  ceFAZolin (ANCEF) IVPB 1 g/50 mL premix     1 g 100 mL/hr over 30 Minutes Intravenous 4 times per day 04/23/14 1840 04/24/14 0614   04/23/14 1645  gentamicin (GARAMYCIN)  80 mg in sodium chloride irrigation 0.9 % 500 mL irrigation  Status:  Discontinued     80 mg Irrigation To Cath Lab 04/23/14 1638 04/23/14 1840   04/23/14 0915  gentamicin (GARAMYCIN) 80 mg in sodium chloride irrigation 0.9 % 500 mL irrigation  Status:  Discontinued     80 mg Irrigation On call 04/23/14 0907 04/23/14 1840   04/23/14 0915  ceFAZolin (ANCEF) IVPB 2 g/50 mL premix  Status:  Discontinued     2 g 100 mL/hr over 30 Minutes Intravenous On call 04/23/14 0907 04/23/14 1840      Assessment: 80 y/o female on day 4 Levaquin for pneumonia. Renal function is stable. Patient is afebrile, WBC are normal, and no culture data exists.  Goal of Therapy:  Eradication of infection  Plan:  - Change Levaquin 750 mg PO q48h - Consider stop date of 8 days total - Levothyroxine and simvastatin were discontinued upon patient transfer 6/10;   consider resuming these meds  [facility], Pharm.D., BCPS Clinical Pharmacist Pager: [phone] 04/29/2014 11:04 AM

## 2014-04-29 NOTE — Progress Notes (Addendum)
Patient Name: [name]      SUBJECTIVE:  Shortness of breath improved    Diuretics resumed Weak but stronger  Chest pain today non pleuritic persistent for 25 minutes  troponins pending   Past Medical History  Diagnosis Date  . Dyslipidemia   . Hypertension   . Palpitations     PVCs/bigeminy on event in May 2009 revealing this was relatively asymptomatic  . Chest pain     a. 2011 Neg MV;  b. 01/2014 Cath: LM 20-30, LAD nl, D1 nl, LCX nl, OM1 nl, RCA dom 30-49m, PD/PL nl, EF 65%->Med Rx.  . Degenerative joint disease   . Hx of varicella   . History of skin cancer     tafeen/ non melanoma.   . Thrombocytopenia   . Anemia   . Monoclonal gammopathies   . Right lower quadrant abdominal pain 07/19/2012    Recurrent  With nausea    This is new since she had ct for llq pain in [facility]    R/o appendiceal problem  Hernia  Get ct scan  And plan  Fu     . Diastolic CHF, acute on chronic 01/31/2012  . Atrial fibrillation     a. Dx 01/2014->Eliquis started.    Scheduled Meds:  Scheduled Meds: . amiodarone  200 mg Oral Daily  . furosemide  40 mg Intravenous BID  . levofloxacin (LEVAQUIN) IV  750 mg Intravenous Q48H  . senna  1 tablet Oral Daily  . sodium chloride  3 mL Intravenous Q12H   Continuous Infusions:  acetaminophen, bisacodyl, bisacodyl, ondansetron (ZOFRAN) IV    PHYSICAL EXAM Filed Vitals:   04/28/14 2300 04/29/14 0604 04/29/14 0809 04/29/14 1448  BP: 128/68 119/64 132/42 130/46  Pulse: 57 58 61 66  Temp: 97.9 F (36.6 C) 97.4 F (36.3 C)  97.6 F (36.4 C)  TempSrc: Oral Oral  Oral  Resp: 20 20  20   Height:      Weight:  157 lb 1.6 oz (71.26 kg)    SpO2: 100% 100% 100% 100%    General appearance: alert, cooperative and mild distress Neck: JVD - 8 cm above sternal notch Lungs: rales bilaterally Heart: regular rate and rhythm and systolic murmur: early systolic 2/6, crescendo and decrescendo at 2nd right intercostal space Abdomen: soft,  non-tender; bowel sounds normal; no masses,  no organomegaly Extremities: edema 1 Skin: Skin color, texture, turgor normal. No rashes or lesions Neurologic: Alert and oriented X 3, normal strength and tone. Normal symmetric reflexes. Normal coordination and gait  TELEMETRY:: av pacing    Intake/Output Summary (Last 24 hours) at 04/29/14 1451 Last data filed at 04/29/14 1344  Gross per 24 hour  Intake   1083 ml  Output   2125 ml  Net  -1042 ml    LABS: Basic Metabolic Panel:  Recent Labs Lab 04/23/14 0423 04/24/14 0500 04/25/14 1100 04/26/14 0258 04/27/14 0435 04/28/14 0430 04/29/14 0356  NA 138 139 139 136* 139 140 140  K 4.5 4.0 4.7 4.1 4.1 3.6* 3.7  CL 101 100 104 102 103 104 100  CO2 23 24 25 22 24 27 25   GLUCOSE 125* 127* 136* 113* 98 95 92  BUN 27* 27* 19 17 17 17 15   CREATININE 1.43* 1.24* 0.97 0.86 0.97 0.95 0.91  CALCIUM 9.1 9.1 8.9 8.9 9.3 9.1 9.7   Cardiac Enzymes:  Recent Labs  04/29/14 0900  TROPONINI <0.30   CBC:  Recent Labs  Lab 04/23/14 1459 04/24/14 0500 04/24/14 2022 04/25/14 0342 04/26/14 0258 04/27/14 0435 04/28/14 0430  WBC 7.4 9.2 8.2 6.4 10.0 7.2 6.3  HGB 11.3* 9.4* 8.9* 7.8* 9.2* 9.3* 9.4*  HCT 36.0 29.9* 28.1* 24.9* 28.4* 29.0* 29.7*  MCV 89.3 89.0 88.9 90.5 89.0 90.3 89.7  PLT 161 136* 131* 112* 128* 135* 159   PROTIME: No results found for this basename: LABPROT, INR,  in the last 72 hours Liver Function Tests: No results found for this basename: AST, ALT, ALKPHOS, BILITOT, PROT, ALBUMIN,  in the last 72 hours No results found for this basename: LIPASE, AMYLASE,  in the last 72 hours BNP: BNP (last 3 results)  Recent Labs  04/13/14 1646 04/19/14 1722 04/20/14 0408  PROBNP 4319.0* 3779.0* 3528.0*   D-Dimer:    ASSESSMENT AND PLAN:  Principal Problem:   Acute on chronic diastolic CHF (congestive heart failure) Active Problems:   Shortness of breath   Bradycardia, sinus   DDD (degenerative disc disease),  thoracolumbar   Hypothyroidism   Atrial fibrillation   Coronary disease non obstucted    Hypertension   Chronic anticoagulation   Chronic renal disease, stage III   Pacemaker   Blood loss anemia  contniue ( increase )  IV diuresis until bun cr bumps OT and PT for mobilization   await troponin Will need to resume apixoban  Still with some drainage from the left subclavicular incision   Signed, Virl Axe MD  04/29/2014

## 2014-04-29 NOTE — Progress Notes (Signed)
[facility] PROGRESS NOTE  [name] JKK:[id number] DOB: 1925/04/27 DOA: 04/19/2014 PCP: [name], MD Brief narrative Patient is an 80 year old with history of Dyslipidemia; Hypertension; Palpitations; Chest pain; Degenerative joint disease; varicella; History of skin cancer; Thrombocytopenia; Anemia; Monoclonal gammopathies; Right lower quadrant abdominal pain (07/19/2012); Diastolic CHF, acute on chronic (01/31/2012); and Atrial fibrillation who presented with sudden onset dyspnea while watching TV. It was noted that she had DCCV on 4/15 but converted back to A. Fib. She was started on IV Lasix for diuresis, Cardiology was consulted following admission she was cardioverted on 6/9 am>> later that day she became hypotensive requiring vasopressors and subsequently was bradycardic and required temporary transvenous pacemaker. Cardiology followed up on 6/10 the permanent pacemaker was placed >> complicated by bleeding, requiring transfusion of 2 units of packed red blood cells, as a result Eliquis was placed on hold per cardiology, and remains on hold at this time   Assessment and Plan: 1. Acute diastolic congestive heart failure. -Transthoracic echocardiogram performed on 02/26/2014 showing ejection fraction of 60-65% without wall motion abnormalities -She presents with clinical signs symptoms suggestive of acute CHF, with initial lab showed an elevated BNP of 3779 -She initially received 40 mg of IV Lasix, was changed Lasix 40 mg PO q daily>> had episode of hypotension 6/9>> diuretics were then held 6/9 d/t hypotension as discussed below, cardiology to follow and resume when appropriate>> follow. -Patient remains on amiodarone, follow. -12 lb wt gain noted since admission likely d/t blood transfusion, IVF, holding diuretcs she has required d/t anemia and hypotension -lasix resume 6/15 per cards, she is assymptomatic-diuresing.  2.  Dizziness -Patient reported dizziness 6/9 likely  resulting from hypotension and bradycardia>> was treated as discussed below -Follow and check orthostatics in am 3.  Hypotension -Likely secondary to antihypertensive agents and diuresis -Systolic blood pressures in the 70's to 90's this morning and 6/9, likely explaining her dizziness.  -Coreg DC'd on 6/9 d/t hypotension and bradycardia -Patient required vasopressors on 6/9>> initially dopamine, changed to Neo-Synephrine (d/t persistent nausea vomiting) -Resolved status post temporal pacer initiated last p.m. per cards, and vasopressors discontinued -Improved, BP remaining stable  4. Chest Pain -Overnight 6/8-9 patient complaining of chest pain  -Had cath in march of 2015 which showed nonobstructive CAD. Cardiac enzymes were cycled and remained negative.  -Chest pain again this a.m. and 6/16>> EKG with paced rhythm at 72>> cycle troponins and follow up-cardiology notified.   5.  Atrial fibrillation with symptomatic bradycardia /sick sinus syndrome -P.m. of 6/9 patient became bradycardic down to the 30s >> Coreg was discontinued and amiodarone held. -On followup per cards last p.m. patient became symptomatic>> transvenous pacer was placed 6/9 -Permanent pacemaker placed  6/10 per cardiology -Patient remains on amiodarone, follow. -apixaban as discussed below, cardiology to follow and resume/recommend when to resume as appropriate 6. Anticoagulation - apixaban remains on hold today s/p pacemaker placement on 7/16 complicated by bleeding>> cards to follow and resume  the apixaban when appropriate.  7. Hypertension -Antihypertensives discontinued on 6/9 d/t hypotension as above -BP stable today, follow.  8. acute blood loss anemia, S/p Pacemaker with Bleeding complication -apixaban on hold per cards -hgb 7.8 from 9.4 6/11 and 11.3 on 6/10, and she is hypotensive as well 6/12 -status post transfusion 2 units of blood on 6/12, hgb remains stable- 9.3 today - 9. Probable  pneumonia/HCAP -CT scan with bilateral lower lobe airspace consolidation -continue Levaquin-started on 6/13, recommend treating for 7-10 days total  Code  Status: Full Family Communication: None at bedside Disposition Plan: Transfer to telemetry  Procedures -Temporal transvenous pacemaker placement on 6/9 per Dr. Burt Knack -Per renal and pacemaker placement 6/10 Consultants  -Cardiology  HPI/Subjective: C/o chest pain this a.m, denies shortness of breath. Objective: Filed Vitals:   04/29/14 0809  BP: 132/42  Pulse: 61  Temp:   Resp:     Intake/Output Summary (Last 24 hours) at 04/29/14 1106 Last data filed at 04/29/14 1048  Gross per 24 hour  Intake   1083 ml  Output   1925 ml  Net   -842 ml   Filed Weights   04/27/14 0350 04/28/14 0446 04/29/14 0604  Weight: 73.8 kg (162 lb 11.2 oz) 72.576 kg (160 lb) 71.26 kg (157 lb 1.6 oz)    Exam:   General:  s/p permanent pacemaker in no acute distress awake alert oriented x3  Cardiovascular: 2-6 systolic ejection murmur. Chest area with dressing clean and dry  Respiratory: Decreased breath sounds at bases, no wheezes  Abdomen: Soft nontender nondistended  Musculoskeletal: +1 edema, no cyanosis  Data Reviewed: Basic Metabolic Panel:  Recent Labs Lab 04/25/14 1100 04/26/14 0258 04/27/14 0435 04/28/14 0430 04/29/14 0356  NA 139 136* 139 140 140  K 4.7 4.1 4.1 3.6* 3.7  CL 104 102 103 104 100  CO2 25 22 24 27 25   GLUCOSE 136* 113* 98 95 92  BUN 19 17 17 17 15   CREATININE 0.97 0.86 0.97 0.95 0.91  CALCIUM 8.9 8.9 9.3 9.1 9.7   Liver Function Tests: No results found for this basename: AST, ALT, ALKPHOS, BILITOT, PROT, ALBUMIN,  in the last 168 hours No results found for this basename: LIPASE, AMYLASE,  in the last 168 hours No results found for this basename: AMMONIA,  in the last 168 hours CBC:  Recent Labs Lab 04/24/14 2022 04/25/14 0342 04/26/14 0258 04/27/14 0435 04/28/14 0430  WBC 8.2 6.4 10.0 7.2  6.3  HGB 8.9* 7.8* 9.2* 9.3* 9.4*  HCT 28.1* 24.9* 28.4* 29.0* 29.7*  MCV 88.9 90.5 89.0 90.3 89.7  PLT 131* 112* 128* 135* 159   Cardiac Enzymes:  Recent Labs Lab 04/29/14 0900  TROPONINI <0.30   BNP (last 3 results)  Recent Labs  04/13/14 1646 04/19/14 1722 04/20/14 0408  PROBNP 4319.0* 3779.0* 3528.0*   CBG:  Recent Labs Lab 04/25/14 2007 04/25/14 2337  GLUCAP 146* 127*    Recent Results (from the past 240 hour(s))  MRSA PCR SCREENING     Status: None   Collection Time    04/22/14  4:45 PM      Result Value Ref Range Status   MRSA by PCR NEGATIVE  NEGATIVE Final   Comment:            The GeneXpert MRSA Assay (FDA     approved for NASAL specimens     only), is one component of a     comprehensive MRSA colonization     surveillance program. It is not     intended to diagnose MRSA     infection nor to guide or     monitor treatment for     MRSA infections.     Studies: No results found.  Scheduled Meds: . amiodarone  200 mg Oral Daily  . furosemide  40 mg Intravenous BID  . levofloxacin (LEVAQUIN) IV  750 mg Intravenous Q48H  . senna  1 tablet Oral Daily  . sodium chloride  3 mL Intravenous Q12H   Continuous Infusions:  Principal Problem:   Acute on chronic diastolic CHF (congestive heart failure) Active Problems:   Shortness of breath   Bradycardia, sinus   DDD (degenerative disc disease), thoracolumbar   Hypothyroidism   Atrial fibrillation   Coronary disease non obstucted    Hypertension   Chronic anticoagulation   Chronic renal disease, stage III   Pacemaker   Blood loss anemia    Time spent: 35 min    [facility] Pager [phone]. If 7PM-7AM, please contact night-coverage at [email or web address], password [facility] 04/29/2014, 11:06 AM  LOS: 10 days

## 2014-04-29 NOTE — Progress Notes (Addendum)
CSW met with patient and spoke to her daughter Sherron Monday via telephone per patient's request. PT is now recommending SNF placement and patient is agreeable. CSW discussed bed search process and provided SNF list to patient. She stated that she defers all decisions to her daughter but would like to be considered for [facility] if possible. Patient has [facility] which will require prior authorization. FL2 will be initiated and placed on chart for MD's signature and active bed search initiated today.  Patient is alert and oriented; lived alone and plans to rehab and then return home when stronger. Her daughter fully supports this.  Lorie Phenix. [place], [place]

## 2014-04-29 NOTE — Progress Notes (Signed)
The patient received Tylenol overnight for complaints of right chest surgical pain.  She did not have any other complaints and was able to sleep overnight.  Her pressure dressing is clean, dry, and intact.  The pacemaker dressing has a small amount of old drainage present.  Her VS are stable.

## 2014-04-30 DIAGNOSIS — N289 Disorder of kidney and ureter, unspecified: Secondary | ICD-10-CM

## 2014-04-30 LAB — CBC
HEMATOCRIT: 32.8 % — AB (ref 36.0–46.0)
Hemoglobin: 10.6 g/dL — ABNORMAL LOW (ref 12.0–15.0)
MCH: 29 pg (ref 26.0–34.0)
MCHC: 32.3 g/dL (ref 30.0–36.0)
MCV: 89.9 fL (ref 78.0–100.0)
Platelets: 192 10*3/uL (ref 150–400)
RBC: 3.65 MIL/uL — AB (ref 3.87–5.11)
RDW: 15.8 % — ABNORMAL HIGH (ref 11.5–15.5)
WBC: 6.3 10*3/uL (ref 4.0–10.5)

## 2014-04-30 LAB — BASIC METABOLIC PANEL
BUN: 14 mg/dL (ref 6–23)
CALCIUM: 9.5 mg/dL (ref 8.4–10.5)
CHLORIDE: 98 meq/L (ref 96–112)
CO2: 27 mEq/L (ref 19–32)
CREATININE: 1.01 mg/dL (ref 0.50–1.10)
GFR calc Af Amer: 56 mL/min — ABNORMAL LOW (ref >90)
GFR calc non Af Amer: 49 mL/min — ABNORMAL LOW (ref >90)
GLUCOSE: 99 mg/dL (ref 70–99)
Potassium: 3.8 mEq/L (ref 3.7–5.3)
Sodium: 140 mEq/L (ref 137–147)

## 2014-04-30 LAB — TROPONIN I: Troponin I: <0.3 ng/mL (ref <0.30)

## 2014-04-30 MED ORDER — LEVOTHYROXINE SODIUM 75 MCG PO TABS
75.0000 ug | ORAL_TABLET | Freq: Every day | ORAL | Status: DC
  Administered 2014-05-01 – 2014-05-02 (×2): 75 ug via ORAL
  Filled 2014-04-30 (×3): qty 1

## 2014-04-30 MED ORDER — SIMVASTATIN 20 MG PO TABS
20.0000 mg | ORAL_TABLET | Freq: Every day | ORAL | Status: DC
  Administered 2014-04-30 – 2014-05-01 (×2): 20 mg via ORAL
  Filled 2014-04-30 (×3): qty 1

## 2014-04-30 MED ORDER — LEVOFLOXACIN 750 MG PO TABS
750.0000 mg | ORAL_TABLET | ORAL | Status: DC
  Administered 2014-05-01: 750 mg via ORAL
  Filled 2014-04-30: qty 1

## 2014-04-30 NOTE — Progress Notes (Signed)
Patient Name: [name]      SUBJECTIVE:  Shortness of breath improved    Diuretics contning  Weak but stronger  No more chest pain  Tn neg   Past Medical History  Diagnosis Date  . Dyslipidemia   . Hypertension   . Palpitations     PVCs/bigeminy on event in May 2009 revealing this was relatively asymptomatic  . Chest pain     a. 2011 Neg MV;  b. 01/2014 Cath: LM 20-30, LAD nl, D1 nl, LCX nl, OM1 nl, RCA dom 30-69m, PD/PL nl, EF 65%->Med Rx.  . Degenerative joint disease   . Hx of varicella   . History of skin cancer     tafeen/ non melanoma.   . Thrombocytopenia   . Anemia   . Monoclonal gammopathies   . Right lower quadrant abdominal pain 07/19/2012    Recurrent  With nausea    This is new since she had ct for llq pain in [facility]    R/o appendiceal problem  Hernia  Get ct scan  And plan  Fu     . Diastolic CHF, acute on chronic 01/31/2012  . Atrial fibrillation     a. Dx 01/2014->Eliquis started.    Scheduled Meds:  Scheduled Meds: . amiodarone  200 mg Oral Daily  . furosemide  40 mg Intravenous BID  . levofloxacin (LEVAQUIN) IV  750 mg Intravenous Q48H  . loratadine  10 mg Oral Daily  . senna  1 tablet Oral Daily  . sodium chloride  3 mL Intravenous Q12H   Continuous Infusions:  acetaminophen, bisacodyl, bisacodyl, ondansetron (ZOFRAN) IV    PHYSICAL EXAM Filed Vitals:   04/29/14 0809 04/29/14 1448 04/29/14 2104 04/30/14 0500  BP: 132/42 130/46 113/39 131/49  Pulse: 61 66 62 60  Temp:  97.6 F (36.4 C) 97.5 F (36.4 C) 97.5 F (36.4 C)  TempSrc:  Oral Oral Oral  Resp:  20 18 18   Height:      Weight:    156 lb 6.7 oz (70.95 kg)  SpO2: 100% 100% 100% 98%    General appearance: alert, cooperative and mild distress Neck: supple Lungs clear  Heart: regular rate and rhythm and systolic murmur: early systolic 2/6, crescendo and decrescendo at 2nd right intercostal space Abdomen: soft, non-tender; bowel sounds normal; no masses,  no  organomegaly Extremities: edema 1+ Skin: Skin color, texture, turgor normal. No rashes or lesions Neurologic: Alert and oriented X 3, normal strength and tone. Normal symmetric reflexes. Normal coordination and gait  TELEMETRY:: av pacing    Intake/Output Summary (Last 24 hours) at 04/30/14 0744 Last data filed at 04/30/14 0502  Gross per 24 hour  Intake    603 ml  Output   1925 ml  Net  -1322 ml    LABS: Basic Metabolic Panel:  Recent Labs Lab 04/24/14 0500 04/25/14 1100 04/26/14 0258 04/27/14 0435 04/28/14 0430 04/29/14 0356 04/30/14 0016  NA 139 139 136* 139 140 140 140  K 4.0 4.7 4.1 4.1 3.6* 3.7 3.8  CL 100 104 102 103 104 100 98  CO2 24 25 22 24 27 25 27   GLUCOSE 127* 136* 113* 98 95 92 99  BUN 27* 19 17 17 17 15 14   CREATININE 1.24* 0.97 0.86 0.97 0.95 0.91 1.01  CALCIUM 9.1 8.9 8.9 9.3 9.1 9.7 9.5   Cardiac Enzymes:  Recent Labs  04/29/14 0900 04/29/14 2059 04/30/14 0016  TROPONINI <0.30 <0.30 <0.30  CBC:  Recent Labs Lab 04/23/14 1459 04/24/14 0500 04/24/14 2022 04/25/14 0342 04/26/14 0258 04/27/14 0435 04/28/14 0430  WBC 7.4 9.2 8.2 6.4 10.0 7.2 6.3  HGB 11.3* 9.4* 8.9* 7.8* 9.2* 9.3* 9.4*  HCT 36.0 29.9* 28.1* 24.9* 28.4* 29.0* 29.7*  MCV 89.3 89.0 88.9 90.5 89.0 90.3 89.7  PLT 161 136* 131* 112* 128* 135* 159   PROTIME: No results found for this basename: LABPROT, INR,  in the last 72 hours Liver Function Tests: No results found for this basename: AST, ALT, ALKPHOS, BILITOT, PROT, ALBUMIN,  in the last 72 hours No results found for this basename: LIPASE, AMYLASE,  in the last 72 hours BNP: BNP (last 3 results)  Recent Labs  04/13/14 1646 04/19/14 1722 04/20/14 0408  PROBNP 4319.0* 3779.0* 3528.0*   D-Dimer:    ASSESSMENT AND PLAN:  Principal Problem:   Acute on chronic diastolic CHF (congestive heart failure) Active Problems:   Shortness of breath   Bradycardia, sinus   DDD (degenerative disc disease),  thoracolumbar   Hypothyroidism   Atrial fibrillation   Coronary disease non obstucted    Hypertension   Chronic anticoagulation   Chronic renal disease, stage III   Pacemaker   Blood loss anemia  contniue ( increase )  IV diuresis until bun cr bumps OT and PT for mobilization  Troponin neg Will need to resume apixoban  Still with some drainage from the left subclavicular incision anticpate home on Friday   Signed, Virl Axe MD  04/30/2014

## 2014-04-30 NOTE — Progress Notes (Signed)
Nutrition Brief Note   Patient screened for Length of Stay >/= 10 days   Wt Readings from Last 15 Encounters:  04/30/14 156 lb 6.7 oz (70.95 kg)  04/30/14 156 lb 6.7 oz (70.95 kg)  04/30/14 156 lb 6.7 oz (70.95 kg)  04/30/14 156 lb 6.7 oz (70.95 kg)  04/14/14 151 lb 0.2 oz (68.5 kg)  04/11/14 156 lb (70.761 kg)  03/12/14 153 lb (69.4 kg)  03/01/14 147 lb (66.679 kg)  02/27/14 148 lb 3.2 oz (67.223 kg)  02/27/14 148 lb 3.2 oz (67.223 kg)  02/27/14 148 lb 3.2 oz (67.223 kg)  02/19/14 160 lb (72.576 kg)  02/03/14 157 lb (71.215 kg)  01/30/14 155 lb 6.8 oz (70.5 kg)  01/30/14 155 lb 6.8 oz (70.5 kg)    Body mass index is 32.7 kg/(m^2). Patient meets criteria for Obesity Class I based on current BMI.   Current diet order is Heart, patient is consuming approximately 75-100% of meals at this time. Labs and medications reviewed.   Pt reports that she has never had much of an appetite but tries to eat well. Pt with no recent weight loss. Pt is well nourished.   No nutrition interventions warranted at this time. If nutrition issues arise, please consult RD.   Carrolyn Leigh, BS Dietetic Intern Pager: [phone]

## 2014-04-30 NOTE — Progress Notes (Signed)
Physical Therapy Treatment Patient Details Name: [name] MRN: [id number] DOB: [birth year] Today's Date: 04/30/2014    History of Present Illness Pt is 80 yo female admiited due to sudden onset of dyspnea while watching tv. Pt recently admitted witha ctue on chronic diastolic CHF exacerbation d/c'd home 04/14/14. Pt with PMH of HTN, chest pain, DJD, thrombocytopenia, aneamia, CHF and Afib. Pt now s/p pacemaker placement on 04/23/14.     PT Comments    Pt progressing towards physical therapy goals. Pt complaining of strained muscle "charlie horse" in her R adductor. PT provided instant heat pack which pt states helps her pain. Will continue to follow and progress as able per POC.   Follow Up Recommendations  SNF;Supervision for mobility/OOB     Equipment Recommendations  Rolling walker with 5" wheels    Recommendations for Other Services       Precautions / Restrictions Precautions Precautions: Fall;ICD/Pacemaker Restrictions Weight Bearing Restrictions: No    Mobility  Bed Mobility Overal bed mobility: Needs Assistance Bed Mobility: Sit to Supine       Sit to supine: Min assist   General bed mobility comments: Assist to elevate LE's into bed. Increased time for pt to scoot to [facility] using bridging technique.   Transfers Overall transfer level: Needs assistance Equipment used: Rolling walker (2 wheeled) Transfers: Sit to/from Stand Sit to Stand: Min guard         General transfer comment: Pt able to power-up to full standing with steadying assist only.   Ambulation/Gait Ambulation/Gait assistance: Min guard Ambulation Distance (Feet): 30 Feet Assistive device: Rolling walker (2 wheeled) Gait Pattern/deviations: Step-through pattern;Decreased stride length Gait velocity: Decreased Gait velocity interpretation: Below normal speed for age/gender General Gait Details: Pt ambulating in room due to O2 (no tank available). Pt able to negotiate well with the RW.     Stairs            Wheelchair Mobility    Modified Rankin (Stroke Patients Only)       Balance Overall balance assessment: History of Falls;No apparent balance deficits (not formally assessed)                                  Cognition Arousal/Alertness: Awake/alert Behavior During Therapy: WFL for tasks assessed/performed Overall Cognitive Status: Within Functional Limits for tasks assessed                      Exercises General Exercises - Lower Extremity Ankle Circles/Pumps: 20 reps Quad Sets: 15 reps Short Arc Quad: 10 reps Hip ABduction/ADduction: 10 reps Straight Leg Raises: 10 reps    General Comments        Pertinent Vitals/Pain Vitals stable throughout session.     Home Living                      Prior Function            PT Goals (current goals can now be found in the care plan section) Acute Rehab PT Goals Patient Stated Goal: To return home after rehab PT Goal Formulation: With patient Time For Goal Achievement: 05/05/14 Potential to Achieve Goals: Good Progress towards PT goals: Progressing toward goals    Frequency  Min 2X/week    PT Plan Current plan remains appropriate    Co-evaluation             End of  Session Equipment Utilized During Treatment: Gait belt Activity Tolerance: Patient tolerated treatment well Patient left: in bed;with call bell/phone within reach;with nursing/sitter in room     Time: 2244-9753 PT Time Calculation (min): 26 min  Charges:  $Gait Training: 8-22 mins $Therapeutic Exercise: 8-22 mins                    G Codes:      [name] 05-11-14, 4:40 PM  [name], PT, DPT Acute Rehabilitation Services Pager: [phone]

## 2014-04-30 NOTE — Progress Notes (Signed)
[facility] PROGRESS NOTE  [name] TFT:[id number] DOB: 13-Jan-1925 DOA: 04/19/2014 PCP: [name], MD Brief narrative Patient is an 80 year old with history of Dyslipidemia; Hypertension; Palpitations; Chest pain; Degenerative joint disease; varicella; History of skin cancer; Thrombocytopenia; Anemia; Monoclonal gammopathies; Right lower quadrant abdominal pain (07/19/2012); Diastolic CHF, acute on chronic (01/31/2012); and Atrial fibrillation who presented with sudden onset dyspnea while watching TV. It was noted that she had DCCV on 4/15 but converted back to A. Fib. She was started on IV Lasix for diuresis, Cardiology was consulted following admission she was cardioverted on 6/9 am>> later that day she became hypotensive requiring vasopressors and subsequently was bradycardic and required temporary transvenous pacemaker. Cardiology followed up on 6/10 the permanent pacemaker was placed >> complicated by bleeding, requiring transfusion of 2 units of packed red blood cells, as a result Eliquis was placed on hold per cardiology, and remains on hold at this time   Assessment and Plan: 1. Acute diastolic congestive heart failure. -Transthoracic echocardiogram performed on 02/26/2014 showing ejection fraction of 60-65% without wall motion abnormalities -She presents with clinical signs symptoms suggestive of acute CHF, with initial lab showed an elevated BNP of 3779 -She initially received 40 mg of IV Lasix, was changed Lasix 40 mg PO q daily>> had episode of hypotension 6/9>> diuretics were then held 6/9 d/t hypotension as discussed below, cardiology to follow and resume when appropriate>> follow. -Patient remains on amiodarone, follow. -12 lb wt gain noted since admission likely d/t blood transfusion, IVF, holding diuretcs she has required d/t anemia and hypotension -lasix resumed 6/15 per cards, she is assymptomatic-diuresing. -continue IV lasix, Bmet daily  2.   Dizziness -Patient reported dizziness 6/9 likely resulting from hypotension and bradycardia>> was treated as discussed below -resolved  3.  Hypotension -Likely secondary to antihypertensive agents and diuresis -Systolic blood pressures in the 70's to 90's this morning and 6/9, likely explaining her dizziness.  -Coreg DC'd on 6/9 d/t hypotension and bradycardia -Patient required vasopressors on 6/9>> initially dopamine, changed to Neo-Synephrine (d/t persistent nausea vomiting) -Resolved status post temporal pacer  -Improved, BP remaining stable  4. Chest Pain -Overnight 6/8-9 patient complaining of chest pain  -Had cath in march of 2015 which showed nonobstructive CAD. Cardiac enzymes were cycled and remained negative.   5.  Atrial fibrillation with symptomatic bradycardia /sick sinus syndrome -P.m. of 6/9 patient became bradycardic down to the 30s >> Coreg was discontinued and amiodarone held. -On followup per cards last p.m. patient became symptomatic>> transvenous pacer was placed 6/9 -Permanent pacemaker placed  6/10 per cardiology -Patient remains on amiodarone, follow. -apixaban as discussed below, cardiology to follow and resume/recommend when to resume as appropriate, will hold now due to bleeding from pacer site  6. Anticoagulation - apixaban remains on hold today s/p pacemaker placement on 7/06 complicated by bleeding>> cards to follow and resume  the apixaban when appropriate.  7. Hypertension -Antihypertensives discontinued on 6/9 d/t hypotension as above -BP stable now, follow.  8. acute blood loss anemia, S/p Pacemaker with Bleeding complication -apixaban on hold per cards -hgb 7.8 from 9.4 6/11 and 11.3 on 6/10, and she is hypotensive as well 6/12 -status post transfusion 2 units of blood on 6/12,  -hgb remains stable  9. Probable pneumonia/HCAP -CT scan with bilateral lower lobe airspace consolidation -continue Levaquin-started on 6/13, recommend treating for  7-10 days total  Code Status: Full Family Communication: None at bedside Disposition Plan: Transfer to telemetry  Procedures -Temporal transvenous pacemaker  placement on 6/9 per Dr. Burt Knack -Permanent pacemaker placement 6/10  Consultants  -Cardiology  HPI/Subjective: Some numbness of arm   Filed Vitals:   04/30/14 0500  BP: 131/49  Pulse: 60  Temp: 97.5 F (36.4 C)  Resp: 18    Intake/Output Summary (Last 24 hours) at 04/30/14 1238 Last data filed at 04/30/14 1045  Gross per 24 hour  Intake    600 ml  Output   1325 ml  Net   -725 ml   Filed Weights   04/28/14 0446 04/29/14 0604 04/30/14 0500  Weight: 72.576 kg (160 lb) 71.26 kg (157 lb 1.6 oz) 70.95 kg (156 lb 6.7 oz)    Exam:   General:  s/p permanent pacemaker in no acute distress awake alert oriented x3  Cardiovascular: 2-6 systolic ejection murmur. Chest area with dressing clean and dry  Chest Left: Dressing overlying the site with scant ooze of serosanguinous discharge  Respiratory: Decreased breath sounds at bases, no wheezes  Abdomen: Soft nontender nondistended  Musculoskeletal: +1 edema, no cyanosis  Data Reviewed: Basic Metabolic Panel:  Recent Labs Lab 04/26/14 0258 04/27/14 0435 04/28/14 0430 04/29/14 0356 04/30/14 0016  NA 136* 139 140 140 140  K 4.1 4.1 3.6* 3.7 3.8  CL 102 103 104 100 98  CO2 22 24 27 25 27   GLUCOSE 113* 98 95 92 99  BUN 17 17 17 15 14   CREATININE 0.86 0.97 0.95 0.91 1.01  CALCIUM 8.9 9.3 9.1 9.7 9.5   Liver Function Tests: No results found for this basename: AST, ALT, ALKPHOS, BILITOT, PROT, ALBUMIN,  in the last 168 hours No results found for this basename: LIPASE, AMYLASE,  in the last 168 hours No results found for this basename: AMMONIA,  in the last 168 hours CBC:  Recent Labs Lab 04/25/14 0342 04/26/14 0258 04/27/14 0435 04/28/14 0430 04/30/14 1115  WBC 6.4 10.0 7.2 6.3 6.3  HGB 7.8* 9.2* 9.3* 9.4* 10.6*  HCT 24.9* 28.4* 29.0* 29.7* 32.8*   MCV 90.5 89.0 90.3 89.7 89.9  PLT 112* 128* 135* 159 192   Cardiac Enzymes:  Recent Labs Lab 04/29/14 0900 04/29/14 2059 04/30/14 0016  TROPONINI <0.30 <0.30 <0.30   BNP (last 3 results)  Recent Labs  04/13/14 1646 04/19/14 1722 04/20/14 0408  PROBNP 4319.0* 3779.0* 3528.0*   CBG:  Recent Labs Lab 04/25/14 2007 04/25/14 2337  GLUCAP 146* 127*    Recent Results (from the past 240 hour(s))  MRSA PCR SCREENING     Status: None   Collection Time    04/22/14  4:45 PM      Result Value Ref Range Status   MRSA by PCR NEGATIVE  NEGATIVE Final   Comment:            The GeneXpert MRSA Assay (FDA     approved for NASAL specimens     only), is one component of a     comprehensive MRSA colonization     surveillance program. It is not     intended to diagnose MRSA     infection nor to guide or     monitor treatment for     MRSA infections.     Studies: No results found.  Scheduled Meds: . amiodarone  200 mg Oral Daily  . furosemide  40 mg Intravenous BID  . [START ON 05/01/2014] levofloxacin  750 mg Oral Q48H  . [START ON 05/01/2014] levothyroxine  75 mcg Oral QAC breakfast  . loratadine  10 mg Oral  Daily  . senna  1 tablet Oral Daily  . simvastatin  20 mg Oral q1800  . sodium chloride  3 mL Intravenous Q12H   Continuous Infusions:    Principal Problem:   Acute on chronic diastolic CHF (congestive heart failure) Active Problems:   Shortness of breath   Bradycardia, sinus   DDD (degenerative disc disease), thoracolumbar   Hypothyroidism   Atrial fibrillation   Coronary disease non obstucted    Hypertension   Chronic anticoagulation   Chronic renal disease, stage III   Pacemaker   Blood loss anemia    Time spent: 35 min    [place] [facility] Pager [phone]. If 7PM-7AM, please contact night-coverage at [email or web address], password [facility] 04/30/2014, 12:38 PM  LOS: 11 days

## 2014-04-30 NOTE — Progress Notes (Signed)
Reanne Barnett RD, LDN Inpatient Clinical Dietitian Pager: [phone] After Hours Pager: [phone]  

## 2014-04-30 NOTE — Progress Notes (Signed)
Bed offers in place; awaiting stability per MD. Patient has [facility] which will require pre-auth- thus will need as much prior notice as possible re: tentative d/c.  Patient wants [facility] and bed offer is in place with this facility.  CSW will monitor.  Lorie Phenix. [place], [place]

## 2014-04-30 NOTE — Progress Notes (Signed)
Dressing to left shoulder reinforced, small amount of bleeding noted, Dr. Broadus John informed, remains alert and oriented times 4, NAD noted, able to communicate needs.

## 2014-05-01 LAB — BASIC METABOLIC PANEL
BUN: 17 mg/dL (ref 6–23)
CALCIUM: 9.5 mg/dL (ref 8.4–10.5)
CO2: 30 mEq/L (ref 19–32)
Chloride: 99 mEq/L (ref 96–112)
Creatinine, Ser: 1.16 mg/dL — ABNORMAL HIGH (ref 0.50–1.10)
GFR calc Af Amer: 48 mL/min — ABNORMAL LOW (ref >90)
GFR, EST NON AFRICAN AMERICAN: 41 mL/min — AB (ref >90)
Glucose, Bld: 100 mg/dL — ABNORMAL HIGH (ref 70–99)
Potassium: 3.6 mEq/L — ABNORMAL LOW (ref 3.7–5.3)
Sodium: 140 mEq/L (ref 137–147)

## 2014-05-01 LAB — CBC
HCT: 29.6 % — ABNORMAL LOW (ref 36.0–46.0)
Hemoglobin: 9.2 g/dL — ABNORMAL LOW (ref 12.0–15.0)
MCH: 28 pg (ref 26.0–34.0)
MCHC: 31.1 g/dL (ref 30.0–36.0)
MCV: 90 fL (ref 78.0–100.0)
PLATELETS: 183 10*3/uL (ref 150–400)
RBC: 3.29 MIL/uL — AB (ref 3.87–5.11)
RDW: 15.9 % — ABNORMAL HIGH (ref 11.5–15.5)
WBC: 5.7 10*3/uL (ref 4.0–10.5)

## 2014-05-01 MED ORDER — ENOXAPARIN SODIUM 30 MG/0.3ML ~~LOC~~ SOLN
30.0000 mg | SUBCUTANEOUS | Status: DC
  Administered 2014-05-01: 30 mg via SUBCUTANEOUS
  Filled 2014-05-01 (×2): qty 0.3

## 2014-05-01 MED ORDER — FUROSEMIDE 20 MG PO TABS
20.0000 mg | ORAL_TABLET | Freq: Two times a day (BID) | ORAL | Status: DC
  Administered 2014-05-01 – 2014-05-02 (×2): 20 mg via ORAL
  Filled 2014-05-01 (×4): qty 1

## 2014-05-01 NOTE — Progress Notes (Signed)
Patient Name: [name]      SUBJECTIVE:  Shortness of breath improved    Diuretics contning  Weak but stronger  No more chest pain  Tn neg   Past Medical History  Diagnosis Date  . Dyslipidemia   . Hypertension   . Palpitations     PVCs/bigeminy on event in May 2009 revealing this was relatively asymptomatic  . Chest pain     a. 2011 Neg MV;  b. 01/2014 Cath: LM 20-30, LAD nl, D1 nl, LCX nl, OM1 nl, RCA dom 30-38m, PD/PL nl, EF 65%->Med Rx.  . Degenerative joint disease   . Hx of varicella   . History of skin cancer     tafeen/ non melanoma.   . Thrombocytopenia   . Anemia   . Monoclonal gammopathies   . Right lower quadrant abdominal pain 07/19/2012    Recurrent  With nausea    This is new since she had ct for llq pain in [place]    R/o appendiceal problem  Hernia  Get ct scan  And plan  Fu     . Diastolic CHF, acute on chronic 01/31/2012  . Atrial fibrillation     a. Dx 01/2014->Eliquis started.    Scheduled Meds:  Scheduled Meds: . amiodarone  200 mg Oral Daily  . furosemide  20 mg Oral BID  . levofloxacin  750 mg Oral Q48H  . levothyroxine  75 mcg Oral QAC breakfast  . loratadine  10 mg Oral Daily  . senna  1 tablet Oral Daily  . simvastatin  20 mg Oral q1800  . sodium chloride  3 mL Intravenous Q12H   Continuous Infusions:  acetaminophen, bisacodyl, bisacodyl, ondansetron (ZOFRAN) IV    PHYSICAL EXAM Filed Vitals:   05/01/14 0548 05/01/14 0550 05/01/14 0552 05/01/14 0559  BP: 126/50 121/85 120/59 148/55  Pulse: 58 78    Temp: 97.3 F (36.3 C)     TempSrc: Oral     Resp: 18     Height:      Weight: 153 lb (69.4 kg)     SpO2: 97%       General appearance: alert, cooperative and mild distress Neck: supple Lungs clear  Heart: regular rate and rhythm and systolic murmur: early systolic 2/6, crescendo and decrescendo at 2nd right intercostal space Abdomen: soft, non-tender; bowel sounds normal; no masses,  no organomegaly Extremities:  edema 1+ Skin: Skin color, texture, turgor normal. No rashes or lesions Neurologic: Alert and oriented X 3, normal strength and tone. Normal symmetric reflexes. Normal coordination and gait  TELEMETRY:: av pacing    Intake/Output Summary (Last 24 hours) at 05/01/14 1105 Last data filed at 05/01/14 0858  Gross per 24 hour  Intake    960 ml  Output   1050 ml  Net    -90 ml    LABS: Basic Metabolic Panel:  Recent Labs Lab 04/25/14 1100 04/26/14 0258 04/27/14 0435 04/28/14 0430 04/29/14 0356 04/30/14 0016 05/01/14 0431  NA 139 136* 139 140 140 140 140  K 4.7 4.1 4.1 3.6* 3.7 3.8 3.6*  CL 104 102 103 104 100 98 99  CO2 25 22 24 27 25 27 30   GLUCOSE 136* 113* 98 95 92 99 100*  BUN 19 17 17 17 15 14 17   CREATININE 0.97 0.86 0.97 0.95 0.91 1.01 1.16*  CALCIUM 8.9 8.9 9.3 9.1 9.7 9.5 9.5   Cardiac Enzymes:  Recent Labs  04/29/14 0900 04/29/14  2059 04/30/14 0016  TROPONINI <0.30 <0.30 <0.30   CBC:  Recent Labs Lab 04/24/14 2022 04/25/14 0342 04/26/14 0258 04/27/14 0435 04/28/14 0430 04/30/14 1115 05/01/14 0431  WBC 8.2 6.4 10.0 7.2 6.3 6.3 5.7  HGB 8.9* 7.8* 9.2* 9.3* 9.4* 10.6* 9.2*  HCT 28.1* 24.9* 28.4* 29.0* 29.7* 32.8* 29.6*  MCV 88.9 90.5 89.0 90.3 89.7 89.9 90.0  PLT 131* 112* 128* 135* 159 192 183   PROTIME: No results found for this basename: LABPROT, INR,  in the last 72 hours Liver Function Tests: No results found for this basename: AST, ALT, ALKPHOS, BILITOT, PROT, ALBUMIN,  in the last 72 hours No results found for this basename: LIPASE, AMYLASE,  in the last 72 hours BNP: BNP (last 3 results)  Recent Labs  04/13/14 1646 04/19/14 1722 04/20/14 0408  PROBNP 4319.0* 3779.0* 3528.0*   D-Dimer:    ASSESSMENT AND PLAN:  Principal Problem:   Acute on chronic diastolic CHF (congestive heart failure) Active Problems:   Shortness of breath   Bradycardia, sinus   DDD (degenerative disc disease), thoracolumbar   Hypothyroidism    Atrial fibrillation   Coronary disease non obstucted    Hypertension   Chronic anticoagulation   Chronic renal disease, stage III   Pacemaker   Blood loss anemia  Transition to PO lasix   Will need to resume apixoban at discharge Still with some drainage from the left subclavicular incision anticpate home/SNF  on Friday   Signed, Virl Axe MD  05/01/2014

## 2014-05-01 NOTE — Progress Notes (Addendum)
[facility] PROGRESS NOTE  JONAH NESTLE PJA:[id number] DOB: 07-04-1925 DOA: 04/19/2014 PCP: [name], MD Brief narrative Patient is an 80 year old with history of Dyslipidemia; Hypertension; Palpitations; Chest pain; Degenerative joint disease; varicella; History of skin cancer; Thrombocytopenia; Anemia; Monoclonal gammopathies; Right lower quadrant abdominal pain (07/19/2012); Diastolic CHF, acute on chronic (01/31/2012); and Atrial fibrillation who presented with sudden onset dyspnea while watching TV. It was noted that she had DCCV on 4/15 but converted back to A. Fib. She was started on IV Lasix for diuresis, Cardiology was consulted following admission she was cardioverted on 6/9 am>> later that day she became hypotensive requiring vasopressors and subsequently was bradycardic and required temporary transvenous pacemaker. Cardiology followed up on 6/10 the permanent pacemaker was placed >> complicated by bleeding, requiring transfusion of 2 units of packed red blood cells, as a result Eliquis was placed on hold   Assessment and Plan: 1. Acute diastolic congestive heart failure. -Transthoracic echocardiogram performed on 02/26/2014 showing ejection fraction of 60-65% without wall motion abnormalities -She presents with clinical signs symptoms suggestive of acute CHF, with initial lab showed an elevated BNP of 3779 -She initially received 40 mg of IV Lasix, was changed Lasix 40 mg PO q daily>> had episode of hypotension 6/9>> diuretics were then held 6/9 d/t hypotension as discussed below, cardiology to follow and resume when appropriate>> follow. -Patient remains on amiodarone, follow. -12 lb wt gain noted since admission likely d/t blood transfusion, IVF, holding diuretcs  -lasix resumed 6/15 per cards, she is assymptomatic -change lasix to PO, slight rise in creatinine and starting to feel dizzy, check orthostatics -SNF tomorrow if stable  2.  Hypotension -Likely secondary  to antihypertensive agents and diuresis -Systolic blood pressures in the 70's to 90's 6/8, 6/9, likely explaining her dizziness.  -Coreg DC'd on 6/9 d/t hypotension and bradycardia -Patient required vasopressors on 6/9>> initially dopamine, changed to Neo-Synephrine (d/t persistent nausea vomiting) -Resolved status post temporal pacer  -Improved, BP remaining stable  4. Chest Pain -Overnight 6/8-9 patient complaining of chest pain  -Had cath in march of 2015 which showed nonobstructive CAD. Cardiac enzymes were cycled and remained negative.   5.  Atrial fibrillation with symptomatic bradycardia /sick sinus syndrome -P.m. of 6/9 patient became bradycardic down to the 30s >> Coreg was discontinued and amiodarone held. -On followup per cards last p.m. patient became symptomatic>> transvenous pacer was placed 6/9 -Permanent pacemaker placed  6/10 per cardiology -Patient remains on amiodarone -apixaban as discussed below, cardiology to follow and resume/recommend when to resume as appropriate, will hold now due to bleeding from pacer site  6. Anticoagulation - apixaban remains on hold s/p pacemaker placement on 3/41 complicated by bleeding>> cards to follow and resume the apixaban when appropriate, defer to cards either today or tomorrow  7. Hypertension -Antihypertensives discontinued on 6/9 d/t hypotension as above -BP stable now, follow.  8. Acute blood loss anemia, S/p Pacemaker with Bleeding complication -apixaban on hold per cards -hgb 7.8 from 9.4 6/11 and 11.3 on 6/10, and she is hypotensive as well 6/12 -status post transfusion 2 units of blood on 6/12,  -hgb remains stable  9. Probable pneumonia/HCAP -CT scan with bilateral lower lobe airspace consolidation -continue Levaquin-started on 6/13, needs 1 more day  Code Status: Full Family Communication: None at bedside Disposition Plan: SNF tomorrow  Procedures -Temporal transvenous pacemaker placement on 6/9 per Dr.  Burt Knack -Permanent pacemaker placement 6/10  Consultants  -Cardiology  HPI/Subjective: Some numbness of arm, improved Dizzy  this am   Filed Vitals:   05/01/14 0559  BP: 148/55  Pulse:   Temp:   Resp:     Intake/Output Summary (Last 24 hours) at 05/01/14 1027 Last data filed at 05/01/14 0858  Gross per 24 hour  Intake    960 ml  Output   1300 ml  Net   -340 ml   Filed Weights   04/29/14 0604 04/30/14 0500 05/01/14 0548  Weight: 71.26 kg (157 lb 1.6 oz) 70.95 kg (156 lb 6.7 oz) 69.4 kg (153 lb)    Exam:   General:  s/p permanent pacemaker in no acute distress awake alert oriented x3  Cardiovascular: 2-6 systolic ejection murmur. Chest area with dressing clean and dry  Chest Left: Dressing overlying the site with scant ooze of serosanguinous discharge  Respiratory: Decreased breath sounds at bases, no wheezes  Abdomen: Soft nontender nondistended  Musculoskeletal: trace edema, no cyanosis  Data Reviewed: Basic Metabolic Panel:  Recent Labs Lab 04/27/14 0435 04/28/14 0430 04/29/14 0356 04/30/14 0016 05/01/14 0431  NA 139 140 140 140 140  K 4.1 3.6* 3.7 3.8 3.6*  CL 103 104 100 98 99  CO2 24 27 25 27 30   GLUCOSE 98 95 92 99 100*  BUN 17 17 15 14 17   CREATININE 0.97 0.95 0.91 1.01 1.16*  CALCIUM 9.3 9.1 9.7 9.5 9.5   Liver Function Tests: No results found for this basename: AST, ALT, ALKPHOS, BILITOT, PROT, ALBUMIN,  in the last 168 hours No results found for this basename: LIPASE, AMYLASE,  in the last 168 hours No results found for this basename: AMMONIA,  in the last 168 hours CBC:  Recent Labs Lab 04/26/14 0258 04/27/14 0435 04/28/14 0430 04/30/14 1115 05/01/14 0431  WBC 10.0 7.2 6.3 6.3 5.7  HGB 9.2* 9.3* 9.4* 10.6* 9.2*  HCT 28.4* 29.0* 29.7* 32.8* 29.6*  MCV 89.0 90.3 89.7 89.9 90.0  PLT 128* 135* 159 192 183   Cardiac Enzymes:  Recent Labs Lab 04/29/14 0900 04/29/14 2059 04/30/14 0016  TROPONINI <0.30 <0.30 <0.30   BNP  (last 3 results)  Recent Labs  04/13/14 1646 04/19/14 1722 04/20/14 0408  PROBNP 4319.0* 3779.0* 3528.0*   CBG:  Recent Labs Lab 04/25/14 2007 04/25/14 2337  GLUCAP 146* 127*    Recent Results (from the past 240 hour(s))  MRSA PCR SCREENING     Status: None   Collection Time    04/22/14  4:45 PM      Result Value Ref Range Status   MRSA by PCR NEGATIVE  NEGATIVE Final   Comment:            The GeneXpert MRSA Assay (FDA     approved for NASAL specimens     only), is one component of a     comprehensive MRSA colonization     surveillance program. It is not     intended to diagnose MRSA     infection nor to guide or     monitor treatment for     MRSA infections.     Studies: No results found.  Scheduled Meds: . amiodarone  200 mg Oral Daily  . furosemide  20 mg Oral BID  . levofloxacin  750 mg Oral Q48H  . levothyroxine  75 mcg Oral QAC breakfast  . loratadine  10 mg Oral Daily  . senna  1 tablet Oral Daily  . simvastatin  20 mg Oral q1800  . sodium chloride  3 mL Intravenous Q12H  Continuous Infusions:    Principal Problem:   Acute on chronic diastolic CHF (congestive heart failure) Active Problems:   Shortness of breath   Bradycardia, sinus   DDD (degenerative disc disease), thoracolumbar   Hypothyroidism   Atrial fibrillation   Coronary disease non obstucted    Hypertension   Chronic anticoagulation   Chronic renal disease, stage III   Pacemaker   Blood loss anemia    Time spent: 35 min    [place] [facility] Pager [phone]. If 7PM-7AM, please contact night-coverage at [email or web address], password [facility] 05/01/2014, 10:27 AM  LOS: 12 days

## 2014-05-02 LAB — BASIC METABOLIC PANEL
BUN: 14 mg/dL (ref 6–23)
CALCIUM: 9.6 mg/dL (ref 8.4–10.5)
CO2: 31 mEq/L (ref 19–32)
Chloride: 102 mEq/L (ref 96–112)
Creatinine, Ser: 1.21 mg/dL — ABNORMAL HIGH (ref 0.50–1.10)
GFR calc Af Amer: 45 mL/min — ABNORMAL LOW (ref >90)
GFR, EST NON AFRICAN AMERICAN: 39 mL/min — AB (ref >90)
GLUCOSE: 88 mg/dL (ref 70–99)
Potassium: 4.1 mEq/L (ref 3.7–5.3)
SODIUM: 142 meq/L (ref 137–147)

## 2014-05-02 LAB — CBC
HCT: 30.2 % — ABNORMAL LOW (ref 36.0–46.0)
Hemoglobin: 9.5 g/dL — ABNORMAL LOW (ref 12.0–15.0)
MCH: 28.5 pg (ref 26.0–34.0)
MCHC: 31.5 g/dL (ref 30.0–36.0)
MCV: 90.7 fL (ref 78.0–100.0)
PLATELETS: 174 10*3/uL (ref 150–400)
RBC: 3.33 MIL/uL — ABNORMAL LOW (ref 3.87–5.11)
RDW: 16.1 % — ABNORMAL HIGH (ref 11.5–15.5)
WBC: 5.5 10*3/uL (ref 4.0–10.5)

## 2014-05-02 MED ORDER — AMIODARONE HCL 200 MG PO TABS: 200.0000 mg | ORAL_TABLET | Freq: Every day | ORAL | Status: DC

## 2014-05-02 MED ORDER — SENNA 8.6 MG PO TABS: 1.0000 | ORAL_TABLET | Freq: Every day | ORAL | Status: DC

## 2014-05-02 MED ORDER — FUROSEMIDE 20 MG PO TABS: 20.0000 mg | ORAL_TABLET | Freq: Two times a day (BID) | ORAL | Status: DC

## 2014-05-02 MED ORDER — APIXABAN 2.5 MG PO TABS: 2.5000 mg | ORAL_TABLET | Freq: Two times a day (BID) | ORAL | Status: DC

## 2014-05-02 NOTE — Discharge Summary (Addendum)
Physician Discharge Summary  [name]:[id number] DOB: Apr 18, 1925 DOA: 04/19/2014  PCP: [name], MD  Admit date: 04/19/2014 Discharge date: 05/02/2014  Time spent: 45 minutes  Recommendations for Outpatient Follow-up:  1. Dr.Gregg Lovena Le in [place], office to call 2. Bmet in 1 week 3. Daily dry dressing at Attempted L chest pacer site  Discharge Diagnoses:  Principal Problem:   Acute on chronic diastolic CHF (congestive heart failure)   S/p insertion of a dual-chamber pacemaker for symptomatic sinus node dysfunction    Pacemaker site bleeding   Acute blood loss anemia   Hypotension   Shortness of breath   Bradycardia, sinus   DDD (degenerative disc disease), thoracolumbar   Hypothyroidism   Atrial fibrillation   Coronary disease non obstucted    Hypertension   Chronic anticoagulation   Chronic renal disease, stage III   Pacemaker   Blood loss anemia   HCAP  Discharge Condition: stable  Diet recommendation: low sodium  Filed Weights   04/30/14 0500 05/01/14 0548 05/02/14 0536  Weight: 70.95 kg (156 lb 6.7 oz) 69.4 kg (153 lb) 69.037 kg (152 lb 3.2 oz)    History of present illness:  The patient is an 80 year old female patient with a history of history of hypertension, hyperlipidemia, and aortic sclerosis, diastolic heart failure and Afib who was admitted to the hospital with chest pain on 3/15 and underwent cardiac catheterization that showed mild nonobstructive disease and normal EF. She was sent home on Eliquis and her rate was controlled. Presented with Sudden onset of dyspnea while watching TV that has rapidly resolved but she did call EMS. Patient has been recently admitted with acute on chronic diastolic CHF exacerbation and was discharged to home 04/14/14 on Lasix 20 mg po q day that seems to be half of her home dose. Patient states she was taken 40 mg prior to admission.  She did well initial y after discharge but this Am noted worsening leg  swelling and her weight went up to 154 lb from dry weight of 152 lb. (of note in the past dry weight was recorded as 150 lb)  Patient denies any chest pain. She received Lasix 40 mg iV in ER has diuresed since then. She states that her shortness of breath have resolved even prior to arrival to ER.   Hospital Course:   Patient is an 80 year old with history of Diastolic CHF, Atrial fibrillation who presented with sudden onset dyspnea while watching TV, she was found to be in CHF. She was started on IV Lasix for diuresis, Cardiology was consulted following admission she was cardioverted on 6/9 am>> later that day she became hypotensive requiring vasopressors and subsequently was bradycardic and required temporary transvenous pacemaker. Cardiology followed up on 6/10 the permanent pacemaker was placed >> complicated by bleeding at pacemaker site, requiring transfusion of 2 units of packed red blood cells, as a result Eliquis was placed on hold. Now stable, bleeding resolved, eliquis resumed  1. Acute diastolic congestive heart failure. -Transthoracic echocardiogram performed on 02/26/2014 showing ejection fraction of 60-65% without wall motion abnormalities  -She presented with clinical signs symptoms suggestive of acute CHF, with initial lab showed an elevated BNP of 3779  -She initially received  IV Lasix, then held due to episode of Hypotension 6/9 as discussed below,   -subsequently lasix resumed again and now BP stable  2. Hypotension  -Likely secondary to antihypertensive agents, diuresis, bradycardia etc -Systolic blood pressures in the 70's to 90's 6/8, 6/9 -  Coreg DC'd on 6/9 d/t hypotension and bradycardia  -Patient required vasopressors on 6/9>> initially dopamine, changed to Neo-Synephrine -Resolved status post temporal pacer  -Improved, BP remaining stable   4. Chest Pain  -Overnight 6/8-9 patient complaining of chest pain  -Had cath in march of 2015 which showed nonobstructive CAD.  Cardiac enzymes were cycled and remained negative.   5. Atrial fibrillation with symptomatic bradycardia /sick sinus syndrome  -P.m. of 6/9 patient became bradycardic down to the 30s >> Coreg was discontinued and amiodarone held.  -subsequently  patient became symptomatic with bradycardia>> transvenous pacer was placed 6/9  -Permanent pacemaker placed 6/10 per cardiology  -Patient remains on amiodarone now on 200mg  daily -apixaban resumed at discharge on 2.5mg  BID    6. Acute blood loss anemia, S/p Pacemaker with Bleeding complication  -apixaban held during this time -hgb 7.8 from 9.4 6/11 and 11.3 on 6/10, and she was hypotensive as well 6/12  -status post transfusion 2 units of blood on 6/12,  -hgb remains stable  -still has small wound at attempted pacer site, which needs daily dry dressings till healed  9. Probable pneumonia/HCAP  -CT scan with bilateral lower lobe airspace consolidation  -continue Levaquin-started on 6/13, completed 7 days of this and hence stopped    Procedures: -transvenous pacer was placed 6/9  -Permanent pacemaker placed 6/10 Blunt   Consultations:  Cardiology  Discharge Exam: Filed Vitals:   05/02/14 0943  BP: 121/33  Pulse: 52  Temp:   Resp:     General: AAOx3 Cardiovascular: S1S2/RRR Respiratory: CTAB  Discharge Instructions You were cared for by a hospitalist during your hospital stay. If you have any questions about your discharge medications or the care you received while you were in the hospital after you are discharged, you can call the unit and asked to speak with the hospitalist on call if the hospitalist that took care of you is not available. Once you are discharged, your primary care physician will handle any further medical issues. Please note that NO REFILLS for any discharge medications will be authorized once you are discharged, as it is imperative that you return to your primary care physician (or establish a  relationship with a primary care physician if you do not have one) for your aftercare needs so that they can reassess your need for medications and monitor your lab values.  Discharge Instructions   Diet - low sodium heart healthy    Complete by:  As directed      Increase activity slowly    Complete by:  As directed             Medication List    STOP taking these medications       carvedilol 12.5 MG tablet  Commonly known as:  COREG      TAKE these medications       amiodarone 200 MG tablet  Commonly known as:  PACERONE  Take 1 tablet (200 mg total) by mouth daily.     apixaban 2.5 MG Tabs tablet  Commonly known as:  ELIQUIS  Take 1 tablet (2.5 mg total) by mouth 2 (two) times daily.     aspirin 81 MG EC tablet  Take 1 tablet (81 mg total) by mouth daily.     CALCIUM PO  Take 1 tablet by mouth daily.     furosemide 20 MG tablet  Commonly known as:  LASIX  Take 1 tablet (20 mg total) by mouth 2 (two) times daily.  HYDROcodone-acetaminophen 5-325 MG per tablet  Commonly known as:  NORCO/VICODIN  Take 1 tablet every 4-6 hours as needed for pain.     levothyroxine 75 MCG tablet  Commonly known as:  SYNTHROID, LEVOTHROID  Take 1 tablet (75 mcg total) by mouth daily.     loratadine 10 MG tablet  Commonly known as:  CLARITIN  Take 10 mg by mouth daily.     nitroGLYCERIN 0.4 MG SL tablet  Commonly known as:  NITROSTAT  Place 1 tablet (0.4 mg total) under the tongue every 5 (five) minutes as needed. For chest pain     senna 8.6 MG Tabs tablet  Commonly known as:  SENOKOT  Take 1 tablet (8.6 mg total) by mouth daily.     simvastatin 20 MG tablet  Commonly known as:  ZOCOR  Take 1 tablet (20 mg total) by mouth at bedtime.     traMADol 50 MG tablet  Commonly known as:  ULTRAM  Take 50 mg by mouth every 6 (six) hours as needed (pain).       No Known Allergies    The results of significant diagnostics from this hospitalization (including imaging,  microbiology, ancillary and laboratory) are listed below for reference.    Significant Diagnostic Studies: Dg Chest 2 View  04/19/2014   CLINICAL DATA:  Shortness of breath and wheezing  EXAM: CHEST  2 VIEW  COMPARISON:  Apr 13, 2014  FINDINGS: There remains cardiomegaly mild pulmonary venous hypertension. There is a mild degree of interstitial edema, minimally less than on recent prior study. There is no airspace consolidation. No new opacity. No adenopathy. There is thoracolumbar levoscoliosis. There is postoperative change in the right shoulder.  IMPRESSION: Evidence of a degree of congestive heart failure with marginally less edema compared to recent prior study. No airspace consolidation.   Electronically Signed   By: Lowella Grip M.D.   On: 04/19/2014 19:03   Dg Chest 2 View  04/13/2014   CLINICAL DATA:  Short of breath.  EXAM: CHEST  2 VIEW  COMPARISON:  02/24/2014  FINDINGS: Cardiac silhouette is mildly enlarged. Normal mediastinal contours. No hilar masses.  There are irregularly thickened interstitial markings bilaterally similar to the prior exam. No focal lung consolidation. No pleural effusion or pneumothorax.  The bony thorax is demineralized but grossly intact.  IMPRESSION: Cardiomegaly with diffuse bilateral interstitial thickening suggests congestive heart failure. No focal consolidation to suggest lobar pneumonia.   Electronically Signed   By: Lajean Manes M.D.   On: 04/13/2014 17:06   Ct Chest Wo Contrast  04/25/2014   CLINICAL DATA:  Evaluate for hemothorax. Failed pacemaker attempted.  EXAM: CT CHEST WITHOUT CONTRAST  TECHNIQUE: Multidetector CT imaging of the chest was performed following the standard protocol without IV contrast.  COMPARISON:  None.  FINDINGS: Small bilateral, low attenuation pleural effusions are identified. There is airspace consolidation in both lower lobes. Mild increased interstitial markings noted bilaterally compatible with interstitial edema.  The heart  size appears enlarged. There is a right chest wall pacer device with leads in the right atrial appendage and right ventricle. There is no pericardial effusion. Calcified atherosclerotic disease involves the thoracic aorta as well as the LAD and RCA coronary arteries. No mediastinal or hilar adenopathy identified.  Cavity containing high attenuation material in the left chest wall measures 6.1 x 3.5 cm and is presumed to represent pocket for pacemaker battery pack, image 14/series 2. There is subcutaneous fat stranding throughout the left breast and left  chest wall with overlying skin thickening. No adenopathy identified.  Incidental imaging through the upper abdomen shows no acute findings. There is a cyst arising from the upper pole of the right kidney measuring 2.7 cm.  Review of the visualized osseous structures shows scoliosis deformity and multi level degenerative disc disease.  IMPRESSION: 1. No evidence for hemothorax. 2. Bilateral pleural effusions and mild interstitial edema. 3. Bilateral lower lobe airspace consolidation. 4. Cardiac enlargement, atherosclerotic disease, and multi vessel coronary artery calcifications. 5. Left chest wall cavity containing high attenuation material is presumed to represent the surgical site of the attempted pacer placement. There is a left chest wall subcutaneous soft tissue infiltration and skin thickening which may reflect cellulitis.   Electronically Signed   By: Kerby Moors M.D.   On: 04/25/2014 17:22   Dg Chest Port 1 View  04/24/2014   CLINICAL DATA:  Hypotension.  EXAM: PORTABLE CHEST - 1 VIEW  COMPARISON:  Chest x-ray 04/23/2014.  FINDINGS: Cardiomegaly with diffuse bilateral pulmonary interstitial prominence noted consistent with congestive heart failure and pulmonary interstitial edema. Bilateral pleural effusions. Cardiac pacer noted with lead tips in right atrium and right ventricle.  IMPRESSION: 1. Congestive heart failure with pulmonary interstitial edema  and bilateral pleural effusions.  2. Cardiac pacer with lead tips in the right atrium and right ventricle.   Electronically Signed   By: Marcello Moores  Register   On: 04/24/2014 08:36   Dg Chest Portable 1 View  04/23/2014   CLINICAL DATA:  Cardiac pacemaker implant.  Rule out pneumothorax.  EXAM: PORTABLE CHEST - 1 VIEW  COMPARISON:  04/19/2014  FINDINGS: A right dual lead cardiac pacemaker has been placed. Leads in the region of the right atrium and right ventricle. No evidence for a pneumothorax. Coarse lung markings suggest chronic changes. Hazy densities in the right lung suggest mild edema or atelectasis. There is volume loss or consolidation at the left lung base. Prominent linear densities along the periphery of the left lung could be related to interstitial edema.  IMPRESSION: Placement of right cardiac pacemaker.  Negative for a pneumothorax.  Densities at the left lung base are suggestive for atelectasis.  Probable mild edema.   Electronically Signed   By: Markus Daft M.D.   On: 04/23/2014 20:44    Microbiology: Recent Results (from the past 240 hour(s))  MRSA PCR SCREENING     Status: None   Collection Time    04/22/14  4:45 PM      Result Value Ref Range Status   MRSA by PCR NEGATIVE  NEGATIVE Final   Comment:            The GeneXpert MRSA Assay (FDA     approved for NASAL specimens     only), is one component of a     comprehensive MRSA colonization     surveillance program. It is not     intended to diagnose MRSA     infection nor to guide or     monitor treatment for     MRSA infections.     Labs: Basic Metabolic Panel:  Recent Labs Lab 04/28/14 0430 04/29/14 0356 04/30/14 0016 05/01/14 0431 05/02/14 0650  NA 140 140 140 140 142  K 3.6* 3.7 3.8 3.6* 4.1  CL 104 100 98 99 102  CO2 27 25 27 30 31   GLUCOSE 95 92 99 100* 88  BUN 17 15 14 17 14   CREATININE 0.95 0.91 1.01 1.16* 1.21*  CALCIUM 9.1 9.7 9.5  9.5 9.6   Liver Function Tests: No results found for this  basename: AST, ALT, ALKPHOS, BILITOT, PROT, ALBUMIN,  in the last 168 hours No results found for this basename: LIPASE, AMYLASE,  in the last 168 hours No results found for this basename: AMMONIA,  in the last 168 hours CBC:  Recent Labs Lab 04/27/14 0435 04/28/14 0430 04/30/14 1115 05/01/14 0431 05/02/14 0650  WBC 7.2 6.3 6.3 5.7 5.5  HGB 9.3* 9.4* 10.6* 9.2* 9.5*  HCT 29.0* 29.7* 32.8* 29.6* 30.2*  MCV 90.3 89.7 89.9 90.0 90.7  PLT 135* 159 192 183 174   Cardiac Enzymes:  Recent Labs Lab 04/29/14 0900 04/29/14 2059 04/30/14 0016  TROPONINI <0.30 <0.30 <0.30   BNP: BNP (last 3 results)  Recent Labs  04/13/14 1646 04/19/14 1722 04/20/14 0408  PROBNP 4319.0* 3779.0* 3528.0*   CBG:  Recent Labs Lab 04/25/14 2007 04/25/14 2337  GLUCAP 146* 127*       Signed:  JOSEPH,PREETHA  [facility] 05/02/2014, 11:35 AM

## 2014-05-02 NOTE — Progress Notes (Signed)
Patient ID: [name], female   DOB: 01/14/1925, 80 y.o.   MRN: [id number]   Patient Name: [name] Date of Encounter: 05/02/2014     Principal Problem:   Acute on chronic diastolic CHF (congestive heart failure) Active Problems:   Shortness of breath   Bradycardia, sinus   DDD (degenerative disc disease), thoracolumbar   Hypothyroidism   Atrial fibrillation   Coronary disease non obstucted    Hypertension   Chronic anticoagulation   Chronic renal disease, stage III   Pacemaker   Blood loss anemia    SUBJECTIVE No chest pain. Still weak. Anxious about going to rehab.  CURRENT MEDS . amiodarone  200 mg Oral Daily  . enoxaparin (LOVENOX) injection  30 mg Subcutaneous Q24H  . furosemide  20 mg Oral BID  . levofloxacin  750 mg Oral Q48H  . levothyroxine  75 mcg Oral QAC breakfast  . loratadine  10 mg Oral Daily  . senna  1 tablet Oral Daily  . simvastatin  20 mg Oral q1800  . sodium chloride  3 mL Intravenous Q12H    OBJECTIVE  Filed Vitals:   05/01/14 1447 05/01/14 2034 05/02/14 0536 05/02/14 0943  BP: 99/34 117/35 113/34 121/33  Pulse: 61 59 63 52  Temp: 98.1 F (36.7 C) 97.9 F (36.6 C) 98.2 F (36.8 C)   TempSrc: Oral Oral Oral   Resp: 18 20 18    Height:      Weight:   152 lb 3.2 oz (69.037 kg)   SpO2: 96% 96% 97%     Intake/Output Summary (Last 24 hours) at 05/02/14 1019 Last data filed at 05/02/14 0900  Gross per 24 hour  Intake    460 ml  Output    750 ml  Net   -290 ml   Filed Weights   04/30/14 0500 05/01/14 0548 05/02/14 0536  Weight: 156 lb 6.7 oz (70.95 kg) 153 lb (69.4 kg) 152 lb 3.2 oz (69.037 kg)    PHYSICAL EXAM  General: Pleasant, elderly woman, NAD. Neuro: Alert and oriented X 3. Moves all extremities spontaneously. Psych: Normal affect HEENT:  Normal  Neck: Supple without bruits or JVD. Lungs:  Resp regular and unlabored, CTA. Heart: RRR no s3, s4, or murmurs. Abdomen: Soft, non-tender, non-distended, BS + x  4.  Extremities: No clubbing, cyanosis or edema. DP/PT/Radials 2+ and equal bilaterally.  Accessory Clinical Findings  CBC  Recent Labs  05/01/14 0431 05/02/14 0650  WBC 5.7 5.5  HGB 9.2* 9.5*  HCT 29.6* 30.2*  MCV 90.0 90.7  PLT 183 563   Basic Metabolic Panel  Recent Labs  05/01/14 0431 05/02/14 0650  NA 140 142  K 3.6* 4.1  CL 99 102  CO2 30 31  GLUCOSE 100* 88  BUN 17 14  CREATININE 1.16* 1.21*  CALCIUM 9.5 9.6   Liver Function Tests No results found for this basename: AST, ALT, ALKPHOS, BILITOT, PROT, ALBUMIN,  in the last 72 hours No results found for this basename: LIPASE, AMYLASE,  in the last 72 hours Cardiac Enzymes  Recent Labs  04/29/14 2059 04/30/14 0016  TROPONINI <0.30 <0.30   BNP No components found with this basename: POCBNP,  D-Dimer No results found for this basename: DDIMER,  in the last 72 hours Hemoglobin A1C No results found for this basename: HGBA1C,  in the last 72 hours Fasting Lipid Panel No results found for this basename: CHOL, HDL, LDLCALC, TRIG, CHOLHDL, LDLDIRECT,  in the last 72 hours  Thyroid Function Tests No results found for this basename: TSH, T4TOTAL, FREET3, T3FREE, THYROIDAB,  in the last 72 hours  TELE  nsr with AV pacing and PVC's.   Radiology/Studies  Dg Chest 2 View  04/19/2014   CLINICAL DATA:  Shortness of breath and wheezing  EXAM: CHEST  2 VIEW  COMPARISON:  Apr 13, 2014  FINDINGS: There remains cardiomegaly mild pulmonary venous hypertension. There is a mild degree of interstitial edema, minimally less than on recent prior study. There is no airspace consolidation. No new opacity. No adenopathy. There is thoracolumbar levoscoliosis. There is postoperative change in the right shoulder.  IMPRESSION: Evidence of a degree of congestive heart failure with marginally less edema compared to recent prior study. No airspace consolidation.   Electronically Signed   By: Lowella Grip M.D.   On: 04/19/2014 19:03    Dg Chest 2 View  04/13/2014   CLINICAL DATA:  Short of breath.  EXAM: CHEST  2 VIEW  COMPARISON:  02/24/2014  FINDINGS: Cardiac silhouette is mildly enlarged. Normal mediastinal contours. No hilar masses.  There are irregularly thickened interstitial markings bilaterally similar to the prior exam. No focal lung consolidation. No pleural effusion or pneumothorax.  The bony thorax is demineralized but grossly intact.  IMPRESSION: Cardiomegaly with diffuse bilateral interstitial thickening suggests congestive heart failure. No focal consolidation to suggest lobar pneumonia.   Electronically Signed   By: Lajean Manes M.D.   On: 04/13/2014 17:06   Ct Chest Wo Contrast  04/25/2014   CLINICAL DATA:  Evaluate for hemothorax. Failed pacemaker attempted.  EXAM: CT CHEST WITHOUT CONTRAST  TECHNIQUE: Multidetector CT imaging of the chest was performed following the standard protocol without IV contrast.  COMPARISON:  None.  FINDINGS: Small bilateral, low attenuation pleural effusions are identified. There is airspace consolidation in both lower lobes. Mild increased interstitial markings noted bilaterally compatible with interstitial edema.  The heart size appears enlarged. There is a right chest wall pacer device with leads in the right atrial appendage and right ventricle. There is no pericardial effusion. Calcified atherosclerotic disease involves the thoracic aorta as well as the LAD and RCA coronary arteries. No mediastinal or hilar adenopathy identified.  Cavity containing high attenuation material in the left chest wall measures 6.1 x 3.5 cm and is presumed to represent pocket for pacemaker battery pack, image 14/series 2. There is subcutaneous fat stranding throughout the left breast and left chest wall with overlying skin thickening. No adenopathy identified.  Incidental imaging through the upper abdomen shows no acute findings. There is a cyst arising from the upper pole of the right kidney measuring 2.7 cm.   Review of the visualized osseous structures shows scoliosis deformity and multi level degenerative disc disease.  IMPRESSION: 1. No evidence for hemothorax. 2. Bilateral pleural effusions and mild interstitial edema. 3. Bilateral lower lobe airspace consolidation. 4. Cardiac enlargement, atherosclerotic disease, and multi vessel coronary artery calcifications. 5. Left chest wall cavity containing high attenuation material is presumed to represent the surgical site of the attempted pacer placement. There is a left chest wall subcutaneous soft tissue infiltration and skin thickening which may reflect cellulitis.   Electronically Signed   By: Kerby Moors M.D.   On: 04/25/2014 17:22   Dg Chest Port 1 View  04/24/2014   CLINICAL DATA:  Hypotension.  EXAM: PORTABLE CHEST - 1 VIEW  COMPARISON:  Chest x-ray 04/23/2014.  FINDINGS: Cardiomegaly with diffuse bilateral pulmonary interstitial prominence noted consistent with congestive heart failure and pulmonary  interstitial edema. Bilateral pleural effusions. Cardiac pacer noted with lead tips in right atrium and right ventricle.  IMPRESSION: 1. Congestive heart failure with pulmonary interstitial edema and bilateral pleural effusions.  2. Cardiac pacer with lead tips in the right atrium and right ventricle.   Electronically Signed   By: Marcello Moores  Register   On: 04/24/2014 08:36   Dg Chest Portable 1 View  04/23/2014   CLINICAL DATA:  Cardiac pacemaker implant.  Rule out pneumothorax.  EXAM: PORTABLE CHEST - 1 VIEW  COMPARISON:  04/19/2014  FINDINGS: A right dual lead cardiac pacemaker has been placed. Leads in the region of the right atrium and right ventricle. No evidence for a pneumothorax. Coarse lung markings suggest chronic changes. Hazy densities in the right lung suggest mild edema or atelectasis. There is volume loss or consolidation at the left lung base. Prominent linear densities along the periphery of the left lung could be related to interstitial edema.   IMPRESSION: Placement of right cardiac pacemaker.  Negative for a pneumothorax.  Densities at the left lung base are suggestive for atelectasis.  Probable mild edema.   Electronically Signed   By: Markus Daft M.D.   On: 04/23/2014 20:44    ASSESSMENT AND PLAN 1. Symptomatic bradycardia, s/p PPM 2. Anemia, s/p transfusion 3. Acute on chronic diastolic CHF, now mostly euvolemic 4. HTN 5. Atrial fib on amio and Eliquis. Rec: ok for discharge from rehab. She will need her sutures removed over left chest prior to discharge. Will arrange.   Gregg Taylor,M.D.  Gregg Taylor,M.D.  05/02/2014 10:19 AM

## 2014-05-08 ENCOUNTER — Emergency Department (HOSPITAL_COMMUNITY): Payer: Medicare Other

## 2014-05-08 ENCOUNTER — Emergency Department (HOSPITAL_COMMUNITY)
Admission: EM | Admit: 2014-05-08 | Discharge: 2014-05-08 | Disposition: A | Discharge status: Home / Self Care | Payer: Medicare Other | Attending: Emergency Medicine | Admitting: Emergency Medicine

## 2014-05-08 DIAGNOSIS — E7800 Pure hypercholesterolemia, unspecified: Secondary | ICD-10-CM | POA: Insufficient documentation

## 2014-05-08 DIAGNOSIS — E1149 Type 2 diabetes mellitus with other diabetic neurological complication: Secondary | ICD-10-CM | POA: Insufficient documentation

## 2014-05-08 DIAGNOSIS — Z792 Long term (current) use of antibiotics: Secondary | ICD-10-CM | POA: Insufficient documentation

## 2014-05-08 DIAGNOSIS — R55 Syncope and collapse: Secondary | ICD-10-CM

## 2014-05-08 DIAGNOSIS — N289 Disorder of kidney and ureter, unspecified: Secondary | ICD-10-CM

## 2014-05-08 DIAGNOSIS — Z7982 Long term (current) use of aspirin: Secondary | ICD-10-CM | POA: Insufficient documentation

## 2014-05-08 DIAGNOSIS — G8929 Other chronic pain: Secondary | ICD-10-CM | POA: Insufficient documentation

## 2014-05-08 DIAGNOSIS — R0789 Other chest pain: Secondary | ICD-10-CM

## 2014-05-08 DIAGNOSIS — Z791 Long term (current) use of non-steroidal anti-inflammatories (NSAID): Secondary | ICD-10-CM | POA: Insufficient documentation

## 2014-05-08 DIAGNOSIS — Z794 Long term (current) use of insulin: Secondary | ICD-10-CM | POA: Insufficient documentation

## 2014-05-08 DIAGNOSIS — R599 Enlarged lymph nodes, unspecified: Secondary | ICD-10-CM | POA: Insufficient documentation

## 2014-05-08 DIAGNOSIS — Z79899 Other long term (current) drug therapy: Secondary | ICD-10-CM | POA: Insufficient documentation

## 2014-05-08 DIAGNOSIS — E1142 Type 2 diabetes mellitus with diabetic polyneuropathy: Secondary | ICD-10-CM | POA: Insufficient documentation

## 2014-05-08 DIAGNOSIS — E559 Vitamin D deficiency, unspecified: Secondary | ICD-10-CM | POA: Insufficient documentation

## 2014-05-08 DIAGNOSIS — I10 Essential (primary) hypertension: Secondary | ICD-10-CM | POA: Insufficient documentation

## 2014-05-08 DIAGNOSIS — Z8719 Personal history of other diseases of the digestive system: Secondary | ICD-10-CM | POA: Insufficient documentation

## 2014-05-08 DIAGNOSIS — M129 Arthropathy, unspecified: Secondary | ICD-10-CM | POA: Insufficient documentation

## 2014-05-08 DIAGNOSIS — R21 Rash and other nonspecific skin eruption: Secondary | ICD-10-CM | POA: Insufficient documentation

## 2014-05-08 DIAGNOSIS — H9210 Otorrhea, unspecified ear: Secondary | ICD-10-CM | POA: Insufficient documentation

## 2014-05-08 LAB — CBC WITH DIFFERENTIAL/PLATELET
BASOS ABS: 0.1 10*3/uL (ref 0.0–0.1)
BASOS PCT: 1 % (ref 0–1)
Eosinophils Absolute: 0.3 10*3/uL (ref 0.0–0.7)
Eosinophils Relative: 5 % (ref 0–5)
HCT: 38.7 % (ref 36.0–46.0)
Hemoglobin: 12.1 g/dL (ref 12.0–15.0)
Lymphocytes Relative: 16 % (ref 12–46)
Lymphs Abs: 1.1 10*3/uL (ref 0.7–4.0)
MCH: 28.9 pg (ref 26.0–34.0)
MCHC: 31.3 g/dL (ref 30.0–36.0)
MCV: 92.6 fL (ref 78.0–100.0)
Monocytes Absolute: 0.6 10*3/uL (ref 0.1–1.0)
Monocytes Relative: 9 % (ref 3–12)
NEUTROS ABS: 4.6 10*3/uL (ref 1.7–7.7)
NEUTROS PCT: 69 % (ref 43–77)
Platelets: 220 10*3/uL (ref 150–400)
RBC: 4.18 MIL/uL (ref 3.87–5.11)
RDW: 16.8 % — AB (ref 11.5–15.5)
WBC: 6.6 10*3/uL (ref 4.0–10.5)

## 2014-05-08 LAB — I-STAT CHEM 8, ED
BUN: 19 mg/dL (ref 6–23)
Calcium, Ion: 1.3 mmol/L (ref 1.13–1.30)
Chloride: 98 mEq/L (ref 96–112)
Creatinine, Ser: 1.6 mg/dL — ABNORMAL HIGH (ref 0.50–1.10)
Glucose, Bld: 127 mg/dL — ABNORMAL HIGH (ref 70–99)
HEMATOCRIT: 40 % (ref 36.0–46.0)
Hemoglobin: 13.6 g/dL (ref 12.0–15.0)
POTASSIUM: 4 meq/L (ref 3.7–5.3)
SODIUM: 141 meq/L (ref 137–147)
TCO2: 30 mmol/L (ref 0–100)

## 2014-05-08 LAB — I-STAT TROPONIN, ED
TROPONIN I, POC: 0.06 ng/mL (ref 0.00–0.08)
TROPONIN I, POC: 0.03 ng/mL (ref 0.00–0.08)

## 2014-05-08 LAB — PRO B NATRIURETIC PEPTIDE: Pro B Natriuretic peptide (BNP): 2511 pg/mL — ABNORMAL HIGH (ref 0–450)

## 2014-05-08 MED ORDER — ACETAMINOPHEN 325 MG PO TABS
650.0000 mg | ORAL_TABLET | Freq: Once | ORAL | Status: AC
  Administered 2014-05-08: 650 mg via ORAL
  Filled 2014-05-08: qty 2

## 2014-05-08 MED ORDER — MORPHINE SULFATE 4 MG/ML IJ SOLN
4.0000 mg | Freq: Once | INTRAMUSCULAR | Status: DC
  Filled 2014-05-08: qty 1

## 2014-05-08 NOTE — ED Notes (Signed)
[place], PA at bedside 

## 2014-05-08 NOTE — ED Notes (Signed)
Per EMS, called out to facility, pt c/o right chest pain, tightness near pacemaker. Pt alert and oriented X4, c/o dizziness/lightheadness, pt denies nausea/vomitting. NAD noted. Pt has had chest pain since last night. Pacemaker has been placed 2 weeks ago. VS stable. IV place 20G left hand. No meds given.

## 2014-05-08 NOTE — ED Provider Notes (Signed)
CSN: [id number]     Arrival date & time 05/08/14  1645 History   First MD Initiated Contact with Patient 05/08/14 1657     No chief complaint on file.    (Consider location/radiation/quality/duration/timing/severity/associated sxs/prior Treatment) HPI  80 year old female with history of atrial fibrillation on Eliquis, CAD with cardiac Cath on March 2015 and recent pacemaker placed 2 weeks ago was brought in EMS for evaluation of chest pain. Patient states she lives at [facility]. Today when she was walking back from the dining hall she felt lightheadedness and dizziness. She sat down and watch TV and subsequently experience sharp pain to her right breast that radiates to the left shoulder and down to her left arm with margins sensation. She also endorsed some shortness of breath, and felt flushed. Symptoms concerning prompting her to call her doctor who recommend patient to come to the ER. Patient states her chest pain has been intermittent, started roughly 3 hours ago. Nothing makes it better or worse. Denies any fever, chills, headache, neck stiffness, productive cough, hemoptysis, abdominal pain, nausea vomiting, numbness or weakness. Patient reports she received 5 baby aspirin prior to arrival. States it did help her discomfort.  Past Medical History  Diagnosis Date  . Dyslipidemia   . Hypertension   . Palpitations     PVCs/bigeminy on event in May 2009 revealing this was relatively asymptomatic  . Chest pain     a. 2011 Neg MV;  b. 01/2014 Cath: LM 20-30, LAD nl, D1 nl, LCX nl, OM1 nl, RCA dom 30-51m, PD/PL nl, EF 65%->Med Rx.  . Degenerative joint disease   . Hx of varicella   . History of skin cancer     tafeen/ non melanoma.   . Thrombocytopenia   . Anemia   . Monoclonal gammopathies   . Right lower quadrant abdominal pain 07/19/2012    Recurrent  With nausea    This is new since she had ct for llq pain in [facility]    R/o appendiceal problem  Hernia  Get ct scan  And  plan  Fu     . Diastolic CHF, acute on chronic 01/31/2012  . Atrial fibrillation     a. Dx 01/2014->Eliquis started.   Past Surgical History  Procedure Laterality Date  . Cholecystectomy  1999  . Cesarean section      times 2  . Shoulder surgery  1996    Right   . Cataract extraction      Bilateral  implantt  . Cardioversion N/A 02/26/2014    Procedure: CARDIOVERSION AT BEDSIDE;  Surgeon: Pixie Casino, MD;  Location: [place];  Service: Cardiovascular;  Laterality: N/A;  . Cardioversion N/A 04/22/2014    Procedure: CARDIOVERSION (BEDSIDE);  Surgeon: [name], MD;  Location: [facility] OR;  Service: Cardiovascular;  Laterality: N/A;  . Pacemaker insertion  04/23/2014    STJ Assurity dual chamber pacemaker implanted by Dr Rayann Heman   Family History  Problem Relation Age of Onset  . Coronary artery disease Father   . Heart disease Father   . Lung cancer    . Alzheimer's disease Mother   . Cancer Brother 72    lung cancer   History  Substance Use Topics  . Smoking status: Never Smoker   . Smokeless tobacco: Never Used  . Alcohol Use: No   OB History   Grav Para Term Preterm Abortions TAB SAB Ect Mult Living  Review of Systems  All other systems reviewed and are negative.     Allergies  Review of patient's allergies indicates no known allergies.  Home Medications   Prior to Admission medications   Medication Sig Start Date End Date Taking? Authorizing Provider  amiodarone (PACERONE) 200 MG tablet Take 1 tablet (200 mg total) by mouth daily. 05/02/14   Domenic Polite, MD  apixaban (ELIQUIS) 2.5 MG TABS tablet Take 1 tablet (2.5 mg total) by mouth 2 (two) times daily. 05/02/14   Domenic Polite, MD  aspirin EC 81 MG EC tablet Take 1 tablet (81 mg total) by mouth daily. 02/27/14   Tarri Fuller, PA-C  CALCIUM PO Take 1 tablet by mouth daily.    Historical Provider, MD  furosemide (LASIX) 20 MG tablet Take 1 tablet (20 mg total) by mouth 2 (two) times daily. 05/02/14    Domenic Polite, MD  HYDROcodone-acetaminophen (NORCO/VICODIN) 5-325 MG per tablet Take 1 tablet every 4-6 hours as needed for pain. 03/01/14   Noland Fordyce, PA-C  levothyroxine (SYNTHROID, LEVOTHROID) 75 MCG tablet Take 1 tablet (75 mcg total) by mouth daily. 01/16/14   Fay Records, MD  loratadine (CLARITIN) 10 MG tablet Take 10 mg by mouth daily.    Historical Provider, MD  nitroGLYCERIN (NITROSTAT) 0.4 MG SL tablet Place 1 tablet (0.4 mg total) under the tongue every 5 (five) minutes as needed. For chest pain 12/02/13   Fay Records, MD  senna (SENOKOT) 8.6 MG TABS tablet Take 1 tablet (8.6 mg total) by mouth daily. 05/02/14   Domenic Polite, MD  simvastatin (ZOCOR) 20 MG tablet Take 1 tablet (20 mg total) by mouth at bedtime. 01/16/14   Fay Records, MD  traMADol (ULTRAM) 50 MG tablet Take 50 mg by mouth every 6 (six) hours as needed (pain).  02/28/14   Historical Provider, MD   BP 126/42  Pulse 84  Temp(Src) 98.2 F (36.8 C) (Oral)  Resp 24  SpO2 98% Physical Exam  Nursing note and vitals reviewed. Constitutional: She is oriented to person, place, and time. She appears well-developed and well-nourished. No distress.  HENT:  Head: Atraumatic.  Mouth/Throat: Oropharynx is clear and moist.  Eyes: Conjunctivae are normal.  Neck: Neck supple. No JVD present.  Cardiovascular:  Irregularly irregular rhythm without murmurs rubs or gallops  Pulmonary/Chest: Effort normal and breath sounds normal.  Pacemaker to L upper chest, normal appearance.   Abdominal: Soft.  No abdominal bruit  Musculoskeletal: She exhibits no edema and no tenderness.  Neurological: She is alert and oriented to person, place, and time.  Skin: No rash noted.  Psychiatric: She has a normal mood and affect.    ED Course  Procedures (including critical care time)  5:16 PM Patient here with dizziness and lightheadedness status post pacemaker placed. Her pacemaker was given because patient has episodes of hypotension  requiring pressors. Pacemaker was placed on 04-23-2014 by Dr. Crissie Sickles. Unsure the specific type of pacemaker. Patient also complaining of chest discomfort. No active chest pain at this time the patient states pain is waxing and waning. She is currently on was and has also received aspirin prior to arrival. She is hemodynamically stable at this time and is alert and oriented. Workup initiated. Care discussed with Dr. Winfred Leeds  6:03 PM Pt with atypical chest pain.  Has recent cath in march that shows nonobstructive disease.  Plan to interrogate [employer] pacemaker and will obtain delta trop.  Will consult cardiology.    6:29 PM  I have consulted with Cardiology who will schedule an outpt f/u appointment.  Pace maker has been interrogated and demonstrates pt with rate controlled afib without acute event.  Given that pt is currently on Eliquis, and is rate control.  Will have cardiology to assess pt outpt.  Care discussed with oncoming provider who will d/c pt pending delta troponin result.  Pt currently CP free, care disccussed with pt and with family members who is at bedside. Pt will also need to have her renal function reexamined.  Renal insufficiency likely 2/2 diuretic medication for her diastolic heart failure.  However, no over fluid overload at this time.     Labs Review Labs Reviewed  CBC WITH DIFFERENTIAL - Abnormal; Notable for the following:    RDW 16.8 (*)    All other components within normal limits  PRO B NATRIURETIC PEPTIDE - Abnormal; Notable for the following:    Pro B Natriuretic peptide (BNP) 2511.0 (*)    All other components within normal limits  I-STAT CHEM 8, ED - Abnormal; Notable for the following:    Creatinine, Ser 1.60 (*)    Glucose, Bld 127 (*)    All other components within normal limits  I-STAT TROPOININ, ED    Imaging Review Dg Chest Portable 1 View  05/08/2014   CLINICAL DATA:  Chest pain.  EXAM: PORTABLE CHEST - 1 VIEW  COMPARISON:  CT 04/25/2014 .  Chest  x-ray 04/13/2014 and 11/01/2013.  FINDINGS: Mediastinum hilar structures normal. Cardiomegaly with normal pulmonary vascularity. Cardiac pacer and lead tips right atrium and right ventricle. No pleural effusion or pneumothorax. Elevation of the left hemidiaphragm is noted. No focal infiltrate. No acute bony abnormality  IMPRESSION: 1. Cardiomegaly. No evidence of overt congestive heart failure. Cardiac pacer noted with lead tips in right atrium right ventricle. 2. Stable elevation of the left hemidiaphragm.   Electronically Signed   By: Marcello Moores  Register   On: 05/08/2014 17:53     EKG Interpretation None      MDM   Final diagnoses:  Near syncope  Chest pain, atypical  Acute renal insufficiency    BP 124/61  Pulse 96  Temp(Src) 98.2 F (36.8 C) (Oral)  Resp 18  SpO2 98%  I have reviewed nursing notes and vital signs. I personally reviewed the imaging tests through PACS system  I reviewed available ER/hospitalization records thought the EMR     Domenic Moras, PA-C 05/09/14 1101

## 2014-05-08 NOTE — ED Notes (Signed)
Portable at bedside 

## 2014-05-08 NOTE — ED Provider Notes (Addendum)
Patient had chest pain at right lateral chest intermittently with chest pain at left lateral chest onset approximately 1:30 p.m. today. Symptoms accompanied by shortness of breath and lightheadedness which lasted for approximately 3 hours. She is presently asymptomatic. treated with aspirin prior to coming here. Nothing makes symptoms better or worse pain is intermittent lasting 5 minutes at a time. On exam patient in no distress lungs clear auscultation heart irregular trace edema skin warm and dry  Orlie Dakin, MD 05/08/14 1735  Orlie Dakin, MD 05/10/14 1884

## 2014-05-08 NOTE — ED Notes (Signed)
Attempted to call [facility] x2. Report given to [facility].

## 2014-05-08 NOTE — ED Notes (Signed)
Pt. Converted from sinus rhythm to atrial fibrillation 05/02/14 and has maintained a.fib since. Ventricular rate is being controlled. No high ventricular rates noted.   Pacemaker is working- no lead displacement.

## 2014-05-08 NOTE — ED Notes (Signed)
[employer] pacemaker- reset, able to transmit.

## 2014-05-08 NOTE — ED Notes (Signed)
Attempted to interrogate pacemaker without success

## 2014-05-08 NOTE — Discharge Instructions (Signed)
Your Heart doctor will call and schedule a follow up appointment for further management of your chest discomfort.  Please continue to take your medication as prescribed.  If your symptoms worsen please return for further care.    Chest Pain (Nonspecific) It is often hard to give a diagnosis for the cause of chest pain. There is always a chance that your pain could be related to something serious, such as a heart attack or a blood clot in the lungs. You need to follow up with your doctor. HOME CARE  If antibiotic medicine was given, take it as directed by your doctor. Finish the medicine even if you start to feel better.  For the next few days, avoid activities that bring on chest pain. Continue physical activities as told by your doctor.  Do not use any tobacco products. This includes cigarettes, chewing tobacco, and e-cigarettes.  Avoid drinking alcohol.  Only take medicine as told by your doctor.  Follow your doctor's suggestions for more testing if your chest pain does not go away.  Keep all doctor visits you made. GET HELP IF:  Your chest pain does not go away, even after treatment.  You have a rash with blisters on your chest.  You have a fever. GET HELP RIGHT AWAY IF:   You have more pain or pain that spreads to your arm, neck, jaw, back, or belly (abdomen).  You have shortness of breath.  You cough more than usual or cough up blood.  You have very bad back or belly pain.  You feel sick to your stomach (nauseous) or throw up (vomit).  You have very bad weakness.  You pass out (faint).  You have chills. This is an emergency. Do not wait to see if the problems will go away. Call your local emergency services (911 in [place] Do not drive yourself to the hospital. MAKE SURE YOU:   Understand these instructions.  Will watch your condition.  Will get help right away if you are not doing well or get worse. Document Released: 04/18/2008 Document Revised: 11/05/2013  Document Reviewed: 04/18/2008 [facility] Patient Information 2015 [place], [place]. This information is not intended to replace advice given to you by your health care provider. Make sure you discuss any questions you have with your health care provider.

## 2014-05-08 NOTE — ED Provider Notes (Signed)
2040 - Patient care assumed from Linda Moras, PA-C at shift change. Patient presenting to the emergency department for chest pain. Plan discussed with [name] which includes discharge if delta troponin negative. Labs reviewed and troponin negative x 2. VSS. Patient stable for d/c with instruction to f/u with her PCP as outpatient. Return precautions provided.  Filed Vitals:   05/08/14 1945 05/08/14 2000 05/08/14 2015 05/08/14 2030  BP: 127/59 135/86 137/46 110/49  Pulse:  74 45 60  Temp:      TempSrc:      Resp:  17 25 19   SpO2:  99% 99% 99%     Linda Breach, PA-C 05/08/14 2046

## 2014-05-08 NOTE — ED Notes (Signed)
Notified [employer]

## 2014-05-08 NOTE — ED Notes (Signed)
VS are wnl. NAD noted. IV taken out. Pt given discharge instructions. Pt and family understood. All questions answered. Pt discharge by wheelchair with family back to nursing facility[facility]. Attempted to give report prior to discharge, unsuccessful.

## 2014-05-09 NOTE — ED Provider Notes (Signed)
Medical screening examination/treatment/procedure(s) were performed by non-physician practitioner and as supervising physician I was immediately available for consultation/collaboration.   EKG Interpretation   Date/Time:  Thursday May 08 2014 16:53:32 EDT Ventricular Rate:  102 PR Interval:    QRS Duration: 90 QT Interval:  359 QTC Calculation: 468 R Axis:   24 Text Interpretation:  Atrial fibrillation Ventricular premature complex  Low voltage, precordial leads Nonspecific T abnormalities, lateral leads  last tracing   paced Confirmed by Winfred Leeds  MD, SAM [phone]) on 05/08/2014  5:27:37 PM       Jasper Riling. Alvino Chapel, MD 05/09/14 [phone]

## 2014-05-10 NOTE — ED Provider Notes (Signed)
Medical screening examination/treatment/procedure(s) were conducted as a shared visit with non-physician practitioner(s) and myself.  I personally evaluated the patient during the encounter.   EKG Interpretation   Date/Time:  Thursday May 08 2014 16:53:32 EDT Ventricular Rate:  102 PR Interval:    QRS Duration: 90 QT Interval:  359 QTC Calculation: 468 R Axis:   24 Text Interpretation:  Atrial fibrillation Ventricular premature complex  Low voltage, precordial leads Nonspecific T abnormalities, lateral leads  last tracing   paced Confirmed by Winfred Leeds  MD, SAM [phone]) on 05/08/2014  5:27:37 PM       Linda Dakin, MD 05/10/14 4656

## 2014-05-14 ENCOUNTER — Telehealth: Payer: Self-pay | Admitting: Family Medicine

## 2014-05-14 NOTE — Telephone Encounter (Signed)
Message copied by Hulda Humphrey on Wed May 14, 2014  1:01 PM ------      Message from: [facility]      Created: Sun May 11, 2014  4:33 PM      Regarding: ed and hosp fu       Please make sure this patient has post hosp  and ed visit with CARDIOLOGY       Dr Harrington Challenger  Et all (not just dr Lovena Le)  .        She has appt with me July 7 but better ir seen bu cards first if possible.              ------

## 2014-05-15 NOTE — Telephone Encounter (Signed)
Spoke to Cardiology and advised that the pt needed a post hospital follow up.  First appt available is with Richardson Dopp, [facility] on July 28 @ 11:30.  Due to providers on vacation.  Will need WP to call for sooner appt.

## 2014-05-20 ENCOUNTER — Ambulatory Visit (INDEPENDENT_AMBULATORY_CARE_PROVIDER_SITE_OTHER): Payer: Medicare Other | Admitting: Internal Medicine

## 2014-05-20 ENCOUNTER — Encounter: Payer: Self-pay | Admitting: Internal Medicine

## 2014-05-20 VITALS — BP 126/76 | HR 80 | Temp 98.5°F | Wt 152.0 lb

## 2014-05-20 DIAGNOSIS — I10 Essential (primary) hypertension: Secondary | ICD-10-CM

## 2014-05-20 DIAGNOSIS — Z950 Presence of cardiac pacemaker: Secondary | ICD-10-CM

## 2014-05-20 DIAGNOSIS — N183 Chronic kidney disease, stage 3 unspecified: Secondary | ICD-10-CM

## 2014-05-20 DIAGNOSIS — Z7901 Long term (current) use of anticoagulants: Secondary | ICD-10-CM

## 2014-05-20 DIAGNOSIS — I482 Chronic atrial fibrillation, unspecified: Secondary | ICD-10-CM

## 2014-05-20 DIAGNOSIS — I5033 Acute on chronic diastolic (congestive) heart failure: Secondary | ICD-10-CM

## 2014-05-20 DIAGNOSIS — I4891 Unspecified atrial fibrillation: Secondary | ICD-10-CM

## 2014-05-20 DIAGNOSIS — I509 Heart failure, unspecified: Secondary | ICD-10-CM

## 2014-05-20 DIAGNOSIS — Z09 Encounter for follow-up examination after completed treatment for conditions other than malignant neoplasm: Secondary | ICD-10-CM

## 2014-05-20 NOTE — Patient Instructions (Signed)
Medication review as planned from [facility] .   Continue   Will send a flag to cardiology to  About your meds to make sure they agree with medication dosing.   Advise repeat  Chem lab   Bmp in about 1-2 weeks   To check kidney function and potassium level.   Weight yourself daily  And call cardiology  Or [place]  If  Large increase in  weight .

## 2014-05-20 NOTE — Progress Notes (Signed)
Pre visit review using our clinic review tool, if applicable. No additional management support is needed unless otherwise documented below in the visit note.  Chief Complaint  Patient presents with  . Hospitalization Follow-up    HPI: Patient comes in today for = visist after multiple hosp for cv causes afib syncope sob and recent pacer  04/23/14  When admitted for chr and last ed visit  For near syncope. She is here today with her daughter Pamala Hurry just discharged from [facility]. I have no discharge summary but a list of medications on discharge that or not the same as on entry to hospital need to review.  Last seen  In primary care Jan 2015 for fy chest pain from ed and then April 2015 urgently sp hosp for AFIB and referred back to card because she had increasing dyspnea and some elevaetion of hr . She was admitted again for HF and afib reasons . Hosp x 4 in 6 months and ed visit x 2  Last card ov was 5 25 15   She has not been seen in op since her last hospitalization 5 31 and pacer procedure . To be seen at the end of July to incision needed to be made because the left upper chest had some fibrosis has a bandage on it please check the right side without significant pain or redness. There has been a home physical therapy not the patient will therapy begun She is on anticoagulatin and cardiology is managing medications . Went to [facility] per hopsital team on June 19th Below are the noted changes: elequis was 5 bid and now 2.5 bid . Amiodarone was 200 bid and now q d  Lasix 20 bid .   Instead of 40 1/2 once a day  lebvothyr 75 mcg continues  Has   appt  With scott weaver  July 27th    seen in ed July 25  See below: Because of some type of chest pressure lightheadedness and dizziness see below 80 year old female with history of atrial fibrillation on Eliquis, CAD with cardiac Cath on March 2015 and recent pacemaker placed 2 weeks ago was brought in EMS for evaluation of chest pain.  Patient states she lives at [place]. Today when she was walking back from the dining hall she felt lightheadedness and dizziness. She sat down and watch TV and subsequently experience sharp pain to her right breast that radiates to the left shoulder and down to her left arm with margins sensation. She also endorsed some shortness of breath, and felt flushed. Symptoms concerning prompting her to call her doctor who recommend patient to come to the ER. Patient states her chest pain has been intermittent, started roughly 3 hours ago. Nothing makes it better or worse. Denies any fever, chills, headache, neck stiffness, productive cough, hemoptysis, abdominal pain, nausea vomiting, numbness or weakness. Patient reports she received 5 baby aspirin prior to arrival. States it did help her discomfort.    Health Maintenance  Topic Date Due  . Colonoscopy  03/14/1977  . Zostavax  03/15/1987  . Pneumococcal Polysaccharide Vaccine Age 45 And Over  03/14/1992  . Influenza Vaccine  06/14/2014  . Tetanus/tdap  07/11/2023   Health Maintenance Review   ROS:  GEN/ HEENT: No fever, significant weight changes sweats headaches vision problems hearing changes, CV/ PULM; No chest pain curerntly edema is better has a small amount no current change in exercise tolerance GI /GU: No adominal pain, vomiting, change in bowel habits. No blood  in the stool. No significant GU symptoms. SKIN/HEME: ,no acute skin rashes suspicious lesions or bleeding. No lymphadenopathy, nodules, masses.  NEURO/ PSYCH:  No neurologic signs such as weakness numbness. No depression anxiety. IMM/ Allergy: No unusual infections.  Allergy .   REST of 12 system review negative except as per HPI   Past Medical History  Diagnosis Date  . Dyslipidemia   . Hypertension   . Palpitations     PVCs/bigeminy on event in May 2009 revealing this was relatively asymptomatic  . Chest pain     a. 2011 Neg MV;  b. 01/2014 Cath: LM 20-30,  LAD nl, D1 nl, LCX nl, OM1 nl, RCA dom 30-28m, PD/PL nl, EF 65%->Med Rx.  . Degenerative joint disease   . Hx of varicella   . History of skin cancer     tafeen/ non melanoma.   . Thrombocytopenia   . Anemia   . Monoclonal gammopathies   . Right lower quadrant abdominal pain 07/19/2012    Recurrent  With nausea    This is new since she had ct for llq pain in [facility]    R/o appendiceal problem  Hernia  Get ct scan  And plan  Fu     . Diastolic CHF, acute on chronic 01/31/2012  . Atrial fibrillation     a. Dx 01/2014->Eliquis started.    Family History  Problem Relation Age of Onset  . Coronary artery disease Father   . Heart disease Father   . Lung cancer    . Alzheimer's disease Mother   . Cancer Brother 63    lung cancer    History   Social History  . Marital Status: Widowed    Spouse Name: N/A    Number of Children: 3  . Years of Education: N/A   Occupational History  .      [employer], book keeping   Social History Main Topics  . Smoking status: Never Smoker   . Smokeless tobacco: Never Used  . Alcohol Use: No  . Drug Use: No  . Sexual Activity: Not Currently   Other Topics Concern  . 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 Eats with her.    Outpatient Encounter Prescriptions as of 05/20/2014  Medication Sig  . amiodarone (PACERONE) 200 MG tablet Take 1 tablet (200 mg total) by mouth daily.  Marland Kitchen apixaban (ELIQUIS) 2.5 MG TABS tablet Take 1 tablet (2.5 mg total) by mouth 2 (two) times daily.  Marland Kitchen aspirin EC 81 MG EC tablet Take 1 tablet (81 mg total) by mouth daily.  Marland Kitchen CALCIUM PO Take 600 mg by mouth daily.   . furosemide (LASIX) 20 MG tablet Take 1 tablet (20 mg total) by mouth 2 (two) times daily.  Marland Kitchen levothyroxine (SYNTHROID, LEVOTHROID)  75 MCG tablet Take 1 tablet (75 mcg total) by mouth daily.  Marland Kitchen loratadine (CLARITIN) 10 MG tablet Take 10 mg by mouth daily.  . nitroGLYCERIN (NITROSTAT) 0.4 MG SL tablet Place 1 tablet (0.4 mg total) under the tongue every 5 (five) minutes as needed. For chest pain  . senna (SENOKOT) 8.6 MG TABS tablet Take 1 tablet (8.6 mg  total) by mouth daily.  . simvastatin (ZOCOR) 20 MG tablet Take 1 tablet (20 mg total) by mouth at bedtime.  . [DISCONTINUED] HYDROcodone-acetaminophen (NORCO/VICODIN) 5-325 MG per tablet Take 1 tablet every 4-6 hours as needed for pain.  . [DISCONTINUED] traMADol (ULTRAM) 50 MG tablet Take 50 mg by mouth every 6 (six) hours as needed (pain).     EXAM:  BP 126/76  Pulse 80  Temp(Src) 98.5 F (36.9 C) (Oral)  Wt 152 lb (68.947 kg)  SpO2 96%  Body mass index is 31.78 kg/(m^2).  Physical Exam: Vital signs reviewed OVZ:CHYI is a well-developed well-nourished alert cooperative    who appearsr stated age in no acute distress. Nontoxic appearing HEENT: normocephalic atraumatic , Eyes: PERRL EOM's full, conjunctiva clear, Nares: paten,t no deformity discharge or tenderness., Ears: no deformityMouth: clear OP, no lesions, edema.  Moist mucous membranes.  NECK: supple without masses, thyromegal CHEST/PULM:  Clear to auscultation and percussion breath sounds equal no wheeze , rales or rhonchi. Well-healed scar on left upper chest bandage from the hospital was removed appears to be healed and nontender right upper chest Steri-Strips are still present with no weeping oozing or redness CV: PMI is nondisplaced, S1 S2 irregularly irregular rhythm no gallops, murmurs, rubs. Peripheral pulses are present.No JVD . Extremities trace to +1 edema ABDOMEN: Bowel sounds normal nontender  No guard or rebound, no hepato splenomegal no CVA tenderness.  No hernia. Extremtities:  No clubbing cyanosis minimal edema, no acute joint swelling or redness no focal atrophy NEURO:  Oriented x3, cranial  nerves 3-12 appear to be intact, no obvious focal weakness,walks with a cane is ambulatory SKIN: No acute rashes normal turgor, color, no bruising or petechiae. PSYCH: Oriented, good eye contact, no obvious depression anxiety, cognition and judgment appear normal. LN: no cervical l adenopathy  Lab Results  Component Value Date   WBC 6.6 05/08/2014   HGB 13.6 05/08/2014   HCT 40.0 05/08/2014   PLT 220 05/08/2014   GLUCOSE 127* 05/08/2014   CHOL 141 04/14/2014   TRIG 56 04/14/2014   HDL 89 04/14/2014   LDLCALC 41 04/14/2014   ALT 8 04/14/2014   AST 12 04/14/2014   NA 141 05/08/2014   K 4.0 05/08/2014   CL 98 05/08/2014   CREATININE 1.60* 05/08/2014   BUN 19 05/08/2014   CO2 31 05/02/2014   TSH 4.450 04/13/2014   INR 1.64* 04/23/2014    ASSESSMENT AND PLAN:  Discussed the following assessment and plan:  Chronic atrial fibrillation  Diastolic CHF, acute on chronic  Essential hypertension  Chronic anticoagulation  Chronic renal disease, stage III - cr worse on last check 1.6 prev 1.3 range   Pacemaker  Hospital discharge follow-up Piecing together information form multiple hosp visits managmed rightfully so by cardiology .  No information except what is in EHR .   Medication changes that daughter questions I assume were done in hospital and a [facility] and are appropriate. We'll send a message to the cardiology team and asked them if they have been input  regard to her medicine before they see her. She seems to be stable today encouraged daily weights.  Creatinine and elevated on last check from baseline would repeat her chemistries including her potassium and creatinine in the next few weeks. Definitely before she sees cardiology Patient Care Team: Burnis Medin, MD as PCP - General (Internal Medicine) Patria Mane, MD (Family Medicine) Fay Records, MD (Cardiology) Tobi Bastos, MD (Orthopedic Surgery) Patient Instructions  Medication review as planned from [facility] .    Continue   Will send a flag to cardiology to  About your meds to make sure they agree with medication dosing.   Advise repeat  Chem lab   Bmp in about 1-2 weeks   To check kidney function and potassium level.   Weight yourself daily  And call cardiology  Or [place]  If  Large increase in  weight .    Standley Brooking. Panosh M.D.  Total visit 60mins > 50% spent counseling and coordinating care  reveiweing meds with pt daughter and plan

## 2014-05-28 ENCOUNTER — Telehealth: Payer: Self-pay | Admitting: Physician Assistant

## 2014-05-28 NOTE — Telephone Encounter (Signed)
Asked by primary care to review medications.    Patient with hx of nonobstructive CAD, persistent AFib, diast CHF, HTN, HL.  She has been on Eliquis.  Amiodarone was started in 03/2014.  Admitted x 2 in 05/6719 with a/c diastolic CHF.  Second admission complicated by profound bradycardia and hypotension after DCCV restored NSR.  She required PPM implantation which was complicated by acute blood loss anemia requiring transfusion with PRBCs.  Eliquis was held for several days.  She was d/c to SNF.  Eliquis was resumed at 2.5 mg bid.  Coreg held (2/2 low BP).  Lasix resumed at slightly higher dose for control of volume.  Amiodarone continued at 200 QD.  After review of medications and recent note by [name], MD, her current medications appear appropriate.    Eliquis should remain at 2.5 mg bid (age > 80 and creatinine > 1.5 >>> meets requirements for dose of 2.5 mg). Stay off [place].  Recent BP was optimal. She only needs to take Amiodarone 200 mg QD now that she has been on it for several months.   Remain on Lasix 20 mg bid.  [name], MD is arranging follow up labs soon.  Her recent weight was stable.    ** Please have patient reduce Simvastatin to 10 mg QHS.  This is the appropriate dose with concomitant Amiodarone.**  Signed,  Richardson Dopp, PA-C   05/28/2014 5:26 PM

## 2014-05-29 ENCOUNTER — Telehealth: Payer: Self-pay | Admitting: *Deleted

## 2014-05-29 MED ORDER — SIMVASTATIN 10 MG PO TABS: 10.0000 mg | ORAL_TABLET | Freq: Every day | ORAL | Status: DC

## 2014-05-29 NOTE — Telephone Encounter (Signed)
s/w pt in refernce to conversation between [place]. PA and Dr. Regis Bill about meds and doses. I advised pt per their conversation to decrease simvastatin to 10 mg QHS. Pt verbalized understanding and verified her f/u 7/28.

## 2014-05-29 NOTE — Telephone Encounter (Signed)
s/w pt in refernce to conversation between [place]. PA and Dr. Regis Bill about meds and doses. I advised pt per their conversation to decrease simvastatin to 10 mg QHS. Pt verbalized understanding and verified her f/u 7/28.

## 2014-06-10 ENCOUNTER — Ambulatory Visit (INDEPENDENT_AMBULATORY_CARE_PROVIDER_SITE_OTHER): Payer: Medicare Other | Admitting: Physician Assistant

## 2014-06-10 ENCOUNTER — Ambulatory Visit (INDEPENDENT_AMBULATORY_CARE_PROVIDER_SITE_OTHER): Payer: Medicare Other | Admitting: *Deleted

## 2014-06-10 ENCOUNTER — Encounter: Payer: Self-pay | Admitting: Physician Assistant

## 2014-06-10 VITALS — BP 139/70 | HR 96 | Ht <= 58 in | Wt 151.0 lb

## 2014-06-10 DIAGNOSIS — I2510 Atherosclerotic heart disease of native coronary artery without angina pectoris: Secondary | ICD-10-CM

## 2014-06-10 DIAGNOSIS — N183 Chronic kidney disease, stage 3 unspecified: Secondary | ICD-10-CM

## 2014-06-10 DIAGNOSIS — I4891 Unspecified atrial fibrillation: Secondary | ICD-10-CM

## 2014-06-10 DIAGNOSIS — Z950 Presence of cardiac pacemaker: Secondary | ICD-10-CM

## 2014-06-10 DIAGNOSIS — I4819 Other persistent atrial fibrillation: Secondary | ICD-10-CM

## 2014-06-10 DIAGNOSIS — I498 Other specified cardiac arrhythmias: Secondary | ICD-10-CM

## 2014-06-10 DIAGNOSIS — I5032 Chronic diastolic (congestive) heart failure: Secondary | ICD-10-CM

## 2014-06-10 DIAGNOSIS — I482 Chronic atrial fibrillation, unspecified: Secondary | ICD-10-CM

## 2014-06-10 DIAGNOSIS — E785 Hyperlipidemia, unspecified: Secondary | ICD-10-CM

## 2014-06-10 DIAGNOSIS — I10 Essential (primary) hypertension: Secondary | ICD-10-CM

## 2014-06-10 DIAGNOSIS — R001 Bradycardia, unspecified: Secondary | ICD-10-CM

## 2014-06-10 LAB — MDC_IDC_ENUM_SESS_TYPE_INCLINIC
Battery Remaining Longevity: 122.4 mo
Battery Voltage: 3.04 V
Brady Statistic RA Percent Paced: 12 %
Brady Statistic RV Percent Paced: 46 %
Date Time Interrogation Session: 20150728133315
Implantable Pulse Generator Model: 2240
Lead Channel Impedance Value: 412.5 Ohm
Lead Channel Impedance Value: 562.5 Ohm
Lead Channel Pacing Threshold Amplitude: 1.25 V
Lead Channel Pacing Threshold Pulse Width: 0.4 ms
Lead Channel Sensing Intrinsic Amplitude: 2.4 mV
Lead Channel Setting Pacing Amplitude: 2 V
Lead Channel Setting Pacing Amplitude: 2.5 V
Lead Channel Setting Sensing Sensitivity: 2 mV
MDC IDC MSMT LEADCHNL RA PACING THRESHOLD AMPLITUDE: 0 V
MDC IDC MSMT LEADCHNL RV PACING THRESHOLD AMPLITUDE: 1.25 V
MDC IDC MSMT LEADCHNL RV PACING THRESHOLD PULSEWIDTH: 0.4 ms
MDC IDC MSMT LEADCHNL RV PACING THRESHOLD PULSEWIDTH: 0.4 ms
MDC IDC MSMT LEADCHNL RV SENSING INTR AMPL: 12 mV
MDC IDC PG SERIAL: 7627922
MDC IDC SET LEADCHNL RV PACING PULSEWIDTH: 0.4 ms

## 2014-06-10 LAB — BASIC METABOLIC PANEL
BUN: 21 mg/dL (ref 6–23)
CHLORIDE: 103 meq/L (ref 96–112)
CO2: 28 mEq/L (ref 19–32)
CREATININE: 1.4 mg/dL — AB (ref 0.4–1.2)
Calcium: 9.8 mg/dL (ref 8.4–10.5)
GFR: 38.1 mL/min — ABNORMAL LOW (ref >60.00)
GLUCOSE: 104 mg/dL — AB (ref 70–99)
POTASSIUM: 4.3 meq/L (ref 3.5–5.1)
Sodium: 139 mEq/L (ref 135–145)

## 2014-06-10 MED ORDER — METOPROLOL TARTRATE 25 MG PO TABS: 25.0000 mg | ORAL_TABLET | Freq: Two times a day (BID) | ORAL | Status: DC

## 2014-06-10 NOTE — Patient Instructions (Signed)
STOP AMIODARONE  START METOPROLOL TARTRATE 25 MG 1 TABLET TWICE DAILY; SPACE OUT EVERY 12 HOURS  LAB WORK TODAY; BMET  KEEP YOUR FOLLOW YOU WITH DR. ROSS 06/16/14  YOU WILL NEED A FOLLOW UP WITH DR. Lovena Le FOR A DR APPT AND DEVICE CHECK AS WELL

## 2014-06-10 NOTE — Progress Notes (Signed)
Overdue wound check appointment. Steri-strips already removed. Wound without redness or edema. Incision edges approximated, wound well healed, removed 1 suture. Normal device function. Threshold, sensing, and impedances consistent with implant measurements. Device initially programmed at 3.5V but changed to chronic settings due to overdue wound check. Changed atrial sensing from 0.5 to 0.52mV. Histogram distribution appropriate for patient and level of activity. 83% mode switch + eliquis. No high ventricular rates noted. ROV w/ Dr. Lovena Le in 68mo.

## 2014-06-10 NOTE — Progress Notes (Signed)
Cardiology Office Note    Date:  06/10/2014   ID:  [name], DOB [birth year], MRN [id number]  PCP:  Linda Dawson, MD  Cardiologist:  Dr. Dorris Carnes   Electrophysiologist:  Dr. Cristopher Peru    History of Present Illness: [name] is a 80 y.o. female with the history of nonobstructive CAD by cardiac catheterization in 01/2014, persistent atrial fibrillation, diastolic CHF, HTN, HL, CKD. Patient was admitted 04/2014 x2 with acute on chronic diastolic CHF. She underwent cardioversion with restoration of NSR. This resulted in profound bradycardia and hypotension requiring pacemaker implantation. Pacemaker implant was complicated by acute blood loss anemia requiring transfusion with PRBCs. Her Eliquis had been held for several days. She was also discharged to SNF. Carvedilol was held secondary to hypotension. She returns for followup.  Review of hospital records demonstrates that she was in NSR at d/c.  She was seen in the emergency room 6/25 with chest discomfort. ECG at that time demonstrated atrial fibrillation.  She denies any significant worsening dyspnea since discharge from the hospital. She denies significant chest pain.  She has been working with home health physical therapy. She is NYHA 2-2b.  She denies orthopnea, PND or edema. Weights have been stable. She denies syncope.   Studies:  - LHC (3/15):  Mid LM 20-30%, mid RCA 30-40%, EF 65%  - Echo (4/15):  Mild LVH, EF 60-65%, no RWMA, mild BAE, trivial eff  - Carotid US (5/15):  40-59% RICA stenosis. 3-61% LICA stenosis.   Recent Labs: 04/13/2014: TSH 4.450  04/14/2014: ALT 8; HDL Cholesterol by NMR 89; LDL (calc) 41  05/08/2014: Creatinine 1.60*; Hemoglobin 13.6; Potassium 4.0; Pro B Natriuretic peptide (BNP) 2511.0*   Wt Readings from Last 3 Encounters:  06/10/14 151 lb (68.493 kg)  05/20/14 152 lb (68.947 kg)  05/02/14 152 lb 3.2 oz (69.037 kg)     Past Medical History  Diagnosis Date  .  Dyslipidemia   . Hypertension   . Palpitations     PVCs/bigeminy on event in May 2009 revealing this was relatively asymptomatic  . Chest pain     a. 2011 Neg MV;  b. 01/2014 Cath: LM 20-30, LAD nl, D1 nl, LCX nl, OM1 nl, RCA dom 30-68m, PD/PL nl, EF 65%->Med Rx.  . Degenerative joint disease   . Hx of varicella   . History of skin cancer     tafeen/ non melanoma.   . Thrombocytopenia   . Anemia   . Monoclonal gammopathies   . Right lower quadrant abdominal pain 07/19/2012    Recurrent  With nausea    This is new since she had ct for llq pain in [facility]    R/o appendiceal problem  Hernia  Get ct scan  And plan  Fu     . Diastolic CHF, acute on chronic 01/31/2012  . Atrial fibrillation     a. Dx 01/2014->Eliquis started.    Current Outpatient Prescriptions  Medication Sig Dispense Refill  . amiodarone (PACERONE) 200 MG tablet Take 1 tablet (200 mg total) by mouth daily.      Marland Kitchen apixaban (ELIQUIS) 2.5 MG TABS tablet Take 1 tablet (2.5 mg total) by mouth 2 (two) times daily.      Marland Kitchen aspirin EC 81 MG EC tablet Take 1 tablet (81 mg total) by mouth daily.      Marland Kitchen CALCIUM PO Take 600 mg by mouth daily.       . furosemide (LASIX) 20 MG tablet Take 1  tablet (20 mg total) by mouth 2 (two) times daily.      Marland Kitchen levothyroxine (SYNTHROID, LEVOTHROID) 75 MCG tablet Take 1 tablet (75 mcg total) by mouth daily.  90 tablet  2  . loratadine (CLARITIN) 10 MG tablet Take 10 mg by mouth daily.      . nitroGLYCERIN (NITROSTAT) 0.4 MG SL tablet Place 1 tablet (0.4 mg total) under the tongue every 5 (five) minutes as needed. For chest pain  25 tablet  6  . simvastatin (ZOCOR) 10 MG tablet Take 1 tablet (10 mg total) by mouth at bedtime.       No current facility-administered medications for this visit.    Allergies:   Review of patient's allergies indicates no known allergies.   Social History:  The patient  reports that she has never smoked. She has never used smokeless tobacco. She reports that she does not drink  alcohol or use illicit drugs.   Family History:  The patient's family history includes Alzheimer's disease in her mother; Cancer (age of onset: 72) in her brother; Coronary artery disease in her father; Heart attack in her father; Heart disease in her father; Lung cancer in an other family member.   ROS:  Please see the history of present illness.   She has noted recent tremors in her right hand.   All other systems reviewed and negative.   PHYSICAL EXAM: VS:  BP 139/70  Pulse 96  Ht 4\' 10"  (1.473 m)  Wt 151 lb (68.493 kg)  BMI 31.57 kg/m2 Well nourished, well developed, in no acute distress HEENT: normal Neck: no JVD Cardiac:  normal S1, S2; irregularly irregular rhythm; no murmur Lungs:  Decreased breath sounds bilaterally, no wheezing, rhonchi or rales Abd: soft, nontender, no hepatomegaly Ext: no edema Skin: warm and dry Neuro:  CNs 2-12 intact, no focal abnormalities noted  EKG:  Atrial fibrillation, HR 96, normal axis, PVCs     ASSESSMENT AND PLAN:  Persistent atrial fibrillation:  She is in atrial fibrillation. Pacemaker was interrogated today. She has been in atrial fibrillation 100% of the time since she was in the emergency room last month. Overall, she does not appear to be that symptomatic. Overall, rate control is reasonable. She has been having some tremors which may be related to the amiodarone. She was on amiodarone for several weeks prior to cardioversion. At this point, I do not think she is receiving much benefit from amiodarone. I will stop amiodarone. I will place her on metoprolol tartrate 25 mg twice a day for rate control. If her rate becomes difficult to control off of amiodarone, we can certainly consider resuming it for rate control purposes only. If she demonstrates instability while in atrial fibrillation over time, we will need to consider Tikosyn.  Chronic diastolic heart failure:  Volume remains stable. Check a basic metabolic panel today.  Essential  hypertension, benign:  Controlled.  Coronary artery disease:  No angina. Continue aspirin, statin.  Chronic renal disease, stage III:  Check followup basic metabolic panel today. If her creatinine improves and remains less than 1.5, consider changing Eliquis back to 5 mg twice a day.  HYPERLIPIDEMIA-MIXED:  Continue statin. She will need follow up lipids and LFTs in the next several months as her simvastatin dose was recently reduced.  If her lipids are uncontrolled, she may be able to resume her prior dose as she is now off of amiodarone.  Pacemaker:  Device interrogated today. Followup with EP as planned.  Disposition:  Follow up with Dr. Harrington Challenger next week as planned to review rate control and make further recommendations if any.   Signed, Versie Starks, MHS 06/10/2014 12:14 PM    [facility] [address], [place], [place]  [address] Phone: [phone]; Fax: [phone]

## 2014-06-11 ENCOUNTER — Telehealth: Payer: Self-pay | Admitting: *Deleted

## 2014-06-11 DIAGNOSIS — I4819 Other persistent atrial fibrillation: Secondary | ICD-10-CM

## 2014-06-11 MED ORDER — METOPROLOL TARTRATE 25 MG PO TABS: 12.5000 mg | ORAL_TABLET | Freq: Two times a day (BID) | ORAL | Status: DC

## 2014-06-11 NOTE — Telephone Encounter (Signed)
lmptcb to pt know per Richardson Dopp, PA to decrease metoprolol tartrate to 12.5 mg bid. See previous phone note from earlier today. I will try pt again later today to go over new instructions.

## 2014-06-11 NOTE — Telephone Encounter (Signed)
pt notified per Richardson Dopp, pac to cut the metoprolol tartrate in 1/2 to = 12.5 mg twice daily. Pt verbalized understanding to Plan of Care.

## 2014-06-11 NOTE — Telephone Encounter (Signed)
pt notified about lab results with verbal understading. Pt states to me today that she felt a little funny w/new start of metoprolol,states lasted about 1 hour or so. Pt had me s/w PT Asst. Kasey from [facility] and she said BP today 98/56 both arms. I advised I will d/w Richardson Dopp, PA to see if this is ok or maybe decrease metoprolol and that I will cb later today, both pt and Kasey PT Asst verbalized understanding to Plan of Care.

## 2014-06-12 ENCOUNTER — Telehealth: Payer: Self-pay | Admitting: Internal Medicine

## 2014-06-12 DIAGNOSIS — M8580 Other specified disorders of bone density and structure, unspecified site: Secondary | ICD-10-CM

## 2014-06-12 DIAGNOSIS — M5136 Other intervertebral disc degeneration, lumbar region: Secondary | ICD-10-CM

## 2014-06-12 DIAGNOSIS — M5135 Other intervertebral disc degeneration, thoracolumbar region: Secondary | ICD-10-CM

## 2014-06-12 DIAGNOSIS — M5126 Other intervertebral disc displacement, lumbar region: Secondary | ICD-10-CM

## 2014-06-12 NOTE — Telephone Encounter (Signed)
Per nicole pt needs order for rollator walker fax order to  [facility]

## 2014-06-13 NOTE — Telephone Encounter (Signed)
yes

## 2014-06-13 NOTE — Telephone Encounter (Signed)
Order placed

## 2014-06-13 NOTE — Telephone Encounter (Signed)
Okay to order?

## 2014-06-16 ENCOUNTER — Ambulatory Visit (INDEPENDENT_AMBULATORY_CARE_PROVIDER_SITE_OTHER): Payer: Medicare Other | Admitting: Internal Medicine

## 2014-06-16 ENCOUNTER — Encounter: Payer: Self-pay | Admitting: Internal Medicine

## 2014-06-16 VITALS — BP 124/77 | HR 93 | Ht <= 58 in | Wt 154.0 lb

## 2014-06-16 DIAGNOSIS — I4819 Other persistent atrial fibrillation: Secondary | ICD-10-CM

## 2014-06-16 DIAGNOSIS — R079 Chest pain, unspecified: Secondary | ICD-10-CM

## 2014-06-16 DIAGNOSIS — I4891 Unspecified atrial fibrillation: Secondary | ICD-10-CM

## 2014-06-16 LAB — BRAIN NATRIURETIC PEPTIDE: Pro B Natriuretic peptide (BNP): 543 pg/mL — ABNORMAL HIGH (ref 0.0–100.0)

## 2014-06-16 NOTE — Progress Notes (Signed)
HPI:  This is an 80 year old female patient  who was admitted to the hospital with chest pain on 3/15 and underwent cardiac catheterization that showed mild nonobstructive disease and normal EF. She also had atrial fibrillation and diastolic heart failure. She was sent home on Eliquis and her rate was controlled. She continued to have dyspnea on exertion and heart failure and was readmitted. She underwent DC cardioversion on 02/26/14. 2-D echo revealed normal LV function and wall motion. She returned to the emergency room on 03/01/14 with chest pain that was felt to be musculoskeletal. She was sent home with a prescription for tramadol. She went back in afib and was started on amiodarone.    She also has a history of hypertension, hyperlipidemia, and aortic sclerosis. She was seen by [name] at the end of  April  More SOB Found to be in afib   Started on amiodarone  I saw her in May   Since seen she is feeling about the same  She cannot say she noticed a difference after cardioversion.    No Known Allergies  Current Outpatient Prescriptions  Medication Sig Dispense Refill  . apixaban (ELIQUIS) 2.5 MG TABS tablet Take 1 tablet (2.5 mg total) by mouth 2 (two) times daily.      Marland Kitchen aspirin EC 81 MG EC tablet Take 1 tablet (81 mg total) by mouth daily.      Marland Kitchen CALCIUM PO Take 600 mg by mouth daily.       . furosemide (LASIX) 20 MG tablet Take 1 tablet (20 mg total) by mouth 2 (two) times daily.      Marland Kitchen levothyroxine (SYNTHROID, LEVOTHROID) 75 MCG tablet Take 1 tablet (75 mcg total) by mouth daily.  90 tablet  2  . loratadine (CLARITIN) 10 MG tablet Take 10 mg by mouth daily.      . metoprolol tartrate (LOPRESSOR) 25 MG tablet Take 0.5 tablets (12.5 mg total) by mouth 2 (two) times daily.      . nitroGLYCERIN (NITROSTAT) 0.4 MG SL tablet Place 1 tablet (0.4 mg total) under the tongue every 5 (five) minutes as needed. For chest pain  25 tablet  6  . simvastatin (ZOCOR) 10 MG tablet Take 1 tablet (10 mg  total) by mouth at bedtime.       No current facility-administered medications for this visit.    Past Medical History  Diagnosis Date  . Dyslipidemia   . Hypertension   . Palpitations     PVCs/bigeminy on event in May 2009 revealing this was relatively asymptomatic  . Chest pain     a. 2011 Neg MV;  b. 01/2014 Cath: LM 20-30, LAD nl, D1 nl, LCX nl, OM1 nl, RCA dom 30-56m, PD/PL nl, EF 65%->Med Rx.  . Degenerative joint disease   . Hx of varicella   . History of skin cancer     tafeen/ non melanoma.   . Thrombocytopenia   . Anemia   . Monoclonal gammopathies   . Right lower quadrant abdominal pain 07/19/2012    Recurrent  With nausea    This is new since she had ct for llq pain in [facility]    R/o appendiceal problem  Hernia  Get ct scan  And plan  Fu     . Diastolic CHF, acute on chronic 01/31/2012  . Atrial fibrillation     a. Dx 01/2014->Eliquis started.    Past Surgical History  Procedure Laterality Date  . Cholecystectomy  1999  . Cesarean  section      times 2  . Shoulder surgery  1996    Right   . Cataract extraction      Bilateral  implantt  . Cardioversion N/A 02/26/2014    Procedure: CARDIOVERSION AT BEDSIDE;  Surgeon: Pixie Casino, MD;  Location: [place];  Service: Cardiovascular;  Laterality: N/A;  . Cardioversion N/A 04/22/2014    Procedure: CARDIOVERSION (BEDSIDE);  Surgeon: [name], MD;  Location: [facility] OR;  Service: Cardiovascular;  Laterality: N/A;  . Pacemaker insertion  04/23/2014    STJ Assurity dual chamber pacemaker implanted by Dr Rayann Heman    Family History  Problem Relation Age of Onset  . Coronary artery disease Father   . Heart disease Father   . Lung cancer    . Alzheimer's disease Mother   . Cancer Brother 71    lung cancer  . Heart attack Father     History   Social History  . Marital Status: Widowed    Spouse Name: N/A    Number of Children: 3  . Years of Education: N/A   Occupational History  .      [employer], book keeping    Social History Main Topics  . Smoking status: Never Smoker   . Smokeless tobacco: Never Used  . Alcohol Use: No  . Drug Use: No  . Sexual Activity: Not Currently   Other Topics Concern  . 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 Eats with her.    Review of Systems:  All systems reviewed.  They are negative to the above problem except as previously stated.  Vital Signs: BP 124/77  Pulse 93  Ht 4\' 10"  (1.473 m)  Wt 154 lb (69.854 kg)  BMI 32.19 kg/m2  Physical Exam  HEENT:  Normocephalic, atraumatic. EOMI, PERRLA.  Neck: JVP is normal.  No bruits.  Lungs: clear to auscultation. No rales no wheezes.  Heart: Irregular rate and rhythm. Normal S1, S2. No S3.   No significant murmurs. PMI not displaced.  Abdomen:  Supple, nontender. Normal bowel sounds. No masses. No hepatomegaly.  Extremities:   Good distal pulses throughout. No lower extremity edema.  Musculoskeletal :moving all extremities.  Neuro:   alert and oriented x3.  CN II-XII grossly intact.  EKG  Atrial fib 93 bpm  Occasional PVC    Assessment and Plan:  1.  Atrial fibrillaton.  Patient is back in afib  It is difficult to tell but I am not convinced she felt better when in SR than now with rate control  I would keep on same regimen.    2.  CP  Denies

## 2014-06-16 NOTE — Patient Instructions (Signed)
Your physician recommends that you continue on your current medications as directed. Please refer to the Current Medication list given to you today.  Lab today: BNP

## 2014-06-23 ENCOUNTER — Other Ambulatory Visit: Payer: Self-pay | Admitting: *Deleted

## 2014-06-23 ENCOUNTER — Telehealth: Payer: Self-pay | Admitting: Internal Medicine

## 2014-06-23 MED ORDER — FUROSEMIDE 20 MG PO TABS: 20.0000 mg | ORAL_TABLET | Freq: Two times a day (BID) | ORAL | Status: DC

## 2014-06-23 NOTE — Telephone Encounter (Signed)
Spoke with pt who states she received call from Dr. Harrington Challenger with instructions about furosemide based on lab results.  Pt calling for clarification. I gave pt instructions from Dr. Harrington Challenger and answered her questions.

## 2014-06-23 NOTE — Telephone Encounter (Signed)
New message     Question about her furosemide.

## 2014-06-26 ENCOUNTER — Telehealth: Payer: Self-pay | Admitting: Internal Medicine

## 2014-06-26 NOTE — Telephone Encounter (Signed)
Patient Information:  Caller Name: [name]  Phone: [phone]  Patient: [name], [name]  Gender: Female  DOB: 06-Jan-1925  Age: 80 Years  PCP: Shanon Ace (Family Practice)  Office Follow Up:  Does the office need to follow up with this patient?: No  Instructions For The Office: N/A  RN Note:  Painful Lump, Back of Neck, onset 1 week. Lump is warm to touch, larger than 1 inch.  Pt denies breathing or swallowing problems. Afebrile.  Lymph Nodes protocol used, see now d/t single large node and size over 1 inch. Same day appt offered, Pt does not have transportation on 8-13, Pt requests appt for 8-14, scheduled next available at 0930 on 8-13 w/ Dr Yong Channel.  Advised Pt to call back if sxs worsen or breathing/swallowing problems developed.   Symptoms  Reason For Call & Symptoms: Painful Lump, Back of Neck, onset 1 week.  Reviewed Health History In EMR: Yes  Reviewed Medications In EMR: Yes  Reviewed Allergies In EMR: Yes  Reviewed Surgeries / Procedures: Yes  Date of Onset of Symptoms: 06/19/2014  Guideline(s) Used:  Lymph Nodes - Swollen  Disposition Per Guideline:   Go to Office Now  Reason For Disposition Reached:   Single large node and size > 1 inch (2.5 cm)  Advice Given:  N/A  Patient Will Follow Care Advice:  YES  Appointment Scheduled:  06/27/2014 09:30:00 Appointment Scheduled Provider:  Garret Reddish

## 2014-06-26 NOTE — Telephone Encounter (Signed)
Sent to Dr. Hunter. 

## 2014-06-26 NOTE — Telephone Encounter (Signed)
Keba-I informed Misty of this and she advised to send to you as FYI.

## 2014-06-27 ENCOUNTER — Inpatient Hospital Stay (HOSPITAL_COMMUNITY)
Admission: EM | Admit: 2014-06-27 | Discharge: 2014-06-30 | DRG: 293 | Disposition: A | Discharge status: Home / Self Care | Payer: Medicare Other | Attending: Family Medicine | Admitting: Family Medicine

## 2014-06-27 ENCOUNTER — Ambulatory Visit (INDEPENDENT_AMBULATORY_CARE_PROVIDER_SITE_OTHER): Payer: Medicare Other | Admitting: Family Medicine

## 2014-06-27 ENCOUNTER — Telehealth: Payer: Self-pay | Admitting: Internal Medicine

## 2014-06-27 ENCOUNTER — Encounter: Payer: Self-pay | Admitting: Family Medicine

## 2014-06-27 ENCOUNTER — Encounter (HOSPITAL_COMMUNITY): Payer: Self-pay | Admitting: Emergency Medicine

## 2014-06-27 ENCOUNTER — Emergency Department (HOSPITAL_COMMUNITY): Payer: Medicare Other

## 2014-06-27 VITALS — BP 128/70 | HR 60 | Temp 98.3°F | Ht <= 58 in | Wt 156.0 lb

## 2014-06-27 DIAGNOSIS — T148XXA Other injury of unspecified body region, initial encounter: Secondary | ICD-10-CM

## 2014-06-27 DIAGNOSIS — Z09 Encounter for follow-up examination after completed treatment for conditions other than malignant neoplasm: Secondary | ICD-10-CM

## 2014-06-27 DIAGNOSIS — I482 Chronic atrial fibrillation, unspecified: Secondary | ICD-10-CM

## 2014-06-27 DIAGNOSIS — E785 Hyperlipidemia, unspecified: Secondary | ICD-10-CM

## 2014-06-27 DIAGNOSIS — I5033 Acute on chronic diastolic (congestive) heart failure: Secondary | ICD-10-CM

## 2014-06-27 DIAGNOSIS — K5900 Constipation, unspecified: Secondary | ICD-10-CM | POA: Diagnosis not present

## 2014-06-27 DIAGNOSIS — Z85828 Personal history of other malignant neoplasm of skin: Secondary | ICD-10-CM | POA: Diagnosis not present

## 2014-06-27 DIAGNOSIS — I999 Unspecified disorder of circulatory system: Secondary | ICD-10-CM

## 2014-06-27 DIAGNOSIS — Z820 Family history of epilepsy and other diseases of the nervous system: Secondary | ICD-10-CM | POA: Diagnosis not present

## 2014-06-27 DIAGNOSIS — M5136 Other intervertebral disc degeneration, lumbar region: Secondary | ICD-10-CM

## 2014-06-27 DIAGNOSIS — Z950 Presence of cardiac pacemaker: Secondary | ICD-10-CM | POA: Diagnosis not present

## 2014-06-27 DIAGNOSIS — IMO0002 Reserved for concepts with insufficient information to code with codable children: Secondary | ICD-10-CM

## 2014-06-27 DIAGNOSIS — I4819 Other persistent atrial fibrillation: Secondary | ICD-10-CM

## 2014-06-27 DIAGNOSIS — D508 Other iron deficiency anemias: Secondary | ICD-10-CM

## 2014-06-27 DIAGNOSIS — M5135 Other intervertebral disc degeneration, thoracolumbar region: Secondary | ICD-10-CM

## 2014-06-27 DIAGNOSIS — M5126 Other intervertebral disc displacement, lumbar region: Secondary | ICD-10-CM

## 2014-06-27 DIAGNOSIS — I509 Heart failure, unspecified: Secondary | ICD-10-CM | POA: Diagnosis present

## 2014-06-27 DIAGNOSIS — D696 Thrombocytopenia, unspecified: Secondary | ICD-10-CM

## 2014-06-27 DIAGNOSIS — I2510 Atherosclerotic heart disease of native coronary artery without angina pectoris: Secondary | ICD-10-CM | POA: Diagnosis present

## 2014-06-27 DIAGNOSIS — M79604 Pain in right leg: Secondary | ICD-10-CM

## 2014-06-27 DIAGNOSIS — Z8249 Family history of ischemic heart disease and other diseases of the circulatory system: Secondary | ICD-10-CM | POA: Diagnosis not present

## 2014-06-27 DIAGNOSIS — Z7982 Long term (current) use of aspirin: Secondary | ICD-10-CM

## 2014-06-27 DIAGNOSIS — I4949 Other premature depolarization: Secondary | ICD-10-CM

## 2014-06-27 DIAGNOSIS — I200 Unstable angina: Secondary | ICD-10-CM

## 2014-06-27 DIAGNOSIS — R079 Chest pain, unspecified: Secondary | ICD-10-CM

## 2014-06-27 DIAGNOSIS — I5032 Chronic diastolic (congestive) heart failure: Secondary | ICD-10-CM | POA: Diagnosis present

## 2014-06-27 DIAGNOSIS — I6529 Occlusion and stenosis of unspecified carotid artery: Secondary | ICD-10-CM

## 2014-06-27 DIAGNOSIS — M1990 Unspecified osteoarthritis, unspecified site: Secondary | ICD-10-CM | POA: Diagnosis present

## 2014-06-27 DIAGNOSIS — Z7901 Long term (current) use of anticoagulants: Secondary | ICD-10-CM

## 2014-06-27 DIAGNOSIS — R209 Unspecified disturbances of skin sensation: Secondary | ICD-10-CM

## 2014-06-27 DIAGNOSIS — I214 Non-ST elevation (NSTEMI) myocardial infarction: Secondary | ICD-10-CM

## 2014-06-27 DIAGNOSIS — I10 Essential (primary) hypertension: Secondary | ICD-10-CM

## 2014-06-27 DIAGNOSIS — I4891 Unspecified atrial fibrillation: Secondary | ICD-10-CM | POA: Diagnosis present

## 2014-06-27 DIAGNOSIS — R0789 Other chest pain: Secondary | ICD-10-CM

## 2014-06-27 DIAGNOSIS — M25511 Pain in right shoulder: Secondary | ICD-10-CM

## 2014-06-27 DIAGNOSIS — E038 Other specified hypothyroidism: Secondary | ICD-10-CM

## 2014-06-27 DIAGNOSIS — R0602 Shortness of breath: Secondary | ICD-10-CM

## 2014-06-27 DIAGNOSIS — R739 Hyperglycemia, unspecified: Secondary | ICD-10-CM

## 2014-06-27 DIAGNOSIS — E8809 Other disorders of plasma-protein metabolism, not elsewhere classified: Secondary | ICD-10-CM

## 2014-06-27 DIAGNOSIS — N183 Chronic kidney disease, stage 3 unspecified: Secondary | ICD-10-CM | POA: Diagnosis present

## 2014-06-27 DIAGNOSIS — Z9849 Cataract extraction status, unspecified eye: Secondary | ICD-10-CM | POA: Diagnosis not present

## 2014-06-27 DIAGNOSIS — I493 Ventricular premature depolarization: Secondary | ICD-10-CM

## 2014-06-27 DIAGNOSIS — N63 Unspecified lump in unspecified breast: Secondary | ICD-10-CM

## 2014-06-27 DIAGNOSIS — D509 Iron deficiency anemia, unspecified: Secondary | ICD-10-CM | POA: Diagnosis present

## 2014-06-27 DIAGNOSIS — N289 Disorder of kidney and ureter, unspecified: Secondary | ICD-10-CM

## 2014-06-27 DIAGNOSIS — D472 Monoclonal gammopathy: Secondary | ICD-10-CM

## 2014-06-27 DIAGNOSIS — IMO0001 Reserved for inherently not codable concepts without codable children: Secondary | ICD-10-CM

## 2014-06-27 DIAGNOSIS — D500 Iron deficiency anemia secondary to blood loss (chronic): Secondary | ICD-10-CM

## 2014-06-27 DIAGNOSIS — E039 Hypothyroidism, unspecified: Secondary | ICD-10-CM

## 2014-06-27 DIAGNOSIS — D649 Anemia, unspecified: Secondary | ICD-10-CM

## 2014-06-27 DIAGNOSIS — R001 Bradycardia, unspecified: Secondary | ICD-10-CM

## 2014-06-27 DIAGNOSIS — K219 Gastro-esophageal reflux disease without esophagitis: Secondary | ICD-10-CM

## 2014-06-27 DIAGNOSIS — M8580 Other specified disorders of bone density and structure, unspecified site: Secondary | ICD-10-CM

## 2014-06-27 DIAGNOSIS — M25519 Pain in unspecified shoulder: Secondary | ICD-10-CM

## 2014-06-27 DIAGNOSIS — M51369 Other intervertebral disc degeneration, lumbar region without mention of lumbar back pain or lower extremity pain: Secondary | ICD-10-CM

## 2014-06-27 DIAGNOSIS — I129 Hypertensive chronic kidney disease with stage 1 through stage 4 chronic kidney disease, or unspecified chronic kidney disease: Secondary | ICD-10-CM | POA: Diagnosis present

## 2014-06-27 DIAGNOSIS — R300 Dysuria: Secondary | ICD-10-CM

## 2014-06-27 LAB — CBC
HEMATOCRIT: 31.2 % — AB (ref 36.0–46.0)
Hemoglobin: 9.7 g/dL — ABNORMAL LOW (ref 12.0–15.0)
MCH: 26.9 pg (ref 26.0–34.0)
MCHC: 31.1 g/dL (ref 30.0–36.0)
MCV: 86.7 fL (ref 78.0–100.0)
Platelets: 167 10*3/uL (ref 150–400)
RBC: 3.6 MIL/uL — AB (ref 3.87–5.11)
RDW: 15.4 % (ref 11.5–15.5)
WBC: 6.3 10*3/uL (ref 4.0–10.5)

## 2014-06-27 LAB — BASIC METABOLIC PANEL
ANION GAP: 13 (ref 5–15)
BUN: 28 mg/dL — AB (ref 6–23)
CHLORIDE: 101 meq/L (ref 96–112)
CO2: 26 mEq/L (ref 19–32)
Calcium: 9.7 mg/dL (ref 8.4–10.5)
Creatinine, Ser: 1.54 mg/dL — ABNORMAL HIGH (ref 0.50–1.10)
GFR calc non Af Amer: 29 mL/min — ABNORMAL LOW (ref >90)
GFR, EST AFRICAN AMERICAN: 34 mL/min — AB (ref >90)
Glucose, Bld: 118 mg/dL — ABNORMAL HIGH (ref 70–99)
Potassium: 4.1 mEq/L (ref 3.7–5.3)
Sodium: 140 mEq/L (ref 137–147)

## 2014-06-27 LAB — TROPONIN I

## 2014-06-27 LAB — I-STAT TROPONIN, ED: Troponin i, poc: 0.02 ng/mL (ref 0.00–0.08)

## 2014-06-27 LAB — PRO B NATRIURETIC PEPTIDE: Pro B Natriuretic peptide (BNP): 1950 pg/mL — ABNORMAL HIGH (ref 0–450)

## 2014-06-27 LAB — RETICULOCYTES
RBC.: 3.45 MIL/uL — AB (ref 3.87–5.11)
RETIC COUNT ABSOLUTE: 65.6 10*3/uL (ref 19.0–186.0)
Retic Ct Pct: 1.9 % (ref 0.4–3.1)

## 2014-06-27 MED ORDER — SODIUM CHLORIDE 0.9 % IV SOLN: 250.0000 mL | INTRAVENOUS | Status: DC | PRN

## 2014-06-27 MED ORDER — APIXABAN 2.5 MG PO TABS
2.5000 mg | ORAL_TABLET | Freq: Two times a day (BID) | ORAL | Status: DC
  Administered 2014-06-27 – 2014-06-30 (×6): 2.5 mg via ORAL
  Filled 2014-06-27 (×7): qty 1

## 2014-06-27 MED ORDER — MORPHINE SULFATE 2 MG/ML IJ SOLN
2.0000 mg | Freq: Once | INTRAMUSCULAR | Status: AC
  Administered 2014-06-27: 2 mg via INTRAVENOUS
  Filled 2014-06-27: qty 1

## 2014-06-27 MED ORDER — GI COCKTAIL ~~LOC~~
30.0000 mL | Freq: Four times a day (QID) | ORAL | Status: DC | PRN
  Administered 2014-06-30: 30 mL via ORAL
  Filled 2014-06-27: qty 30

## 2014-06-27 MED ORDER — SODIUM CHLORIDE 0.9 % IJ SOLN
3.0000 mL | Freq: Two times a day (BID) | INTRAMUSCULAR | Status: DC
  Administered 2014-06-27 – 2014-06-29 (×5): 3 mL via INTRAVENOUS

## 2014-06-27 MED ORDER — ASPIRIN EC 81 MG PO TBEC
81.0000 mg | DELAYED_RELEASE_TABLET | Freq: Every day | ORAL | Status: DC
  Administered 2014-06-28: 81 mg via ORAL
  Filled 2014-06-27 (×2): qty 1

## 2014-06-27 MED ORDER — ACETAMINOPHEN 325 MG PO TABS: 650.0000 mg | ORAL_TABLET | ORAL | Status: DC | PRN

## 2014-06-27 MED ORDER — LEVOTHYROXINE SODIUM 75 MCG PO TABS
75.0000 ug | ORAL_TABLET | Freq: Every day | ORAL | Status: DC
  Administered 2014-06-28 – 2014-06-30 (×3): 75 ug via ORAL
  Filled 2014-06-27 (×5): qty 1

## 2014-06-27 MED ORDER — MORPHINE SULFATE 2 MG/ML IJ SOLN
2.0000 mg | INTRAMUSCULAR | Status: DC | PRN
  Administered 2014-06-27 – 2014-06-28 (×3): 2 mg via INTRAVENOUS
  Filled 2014-06-27 (×3): qty 1

## 2014-06-27 MED ORDER — ONDANSETRON HCL 4 MG/2ML IJ SOLN
4.0000 mg | Freq: Four times a day (QID) | INTRAMUSCULAR | Status: DC | PRN
  Administered 2014-06-29: 4 mg via INTRAVENOUS
  Filled 2014-06-27: qty 2

## 2014-06-27 MED ORDER — SODIUM CHLORIDE 0.9 % IJ SOLN: 3.0000 mL | INTRAMUSCULAR | Status: DC | PRN

## 2014-06-27 MED ORDER — METOPROLOL TARTRATE 12.5 MG HALF TABLET
12.5000 mg | ORAL_TABLET | Freq: Two times a day (BID) | ORAL | Status: DC
  Filled 2014-06-27 (×3): qty 1

## 2014-06-27 MED ORDER — SIMVASTATIN 10 MG PO TABS
10.0000 mg | ORAL_TABLET | Freq: Every day | ORAL | Status: DC
  Administered 2014-06-27 – 2014-06-29 (×3): 10 mg via ORAL
  Filled 2014-06-27 (×4): qty 1

## 2014-06-27 MED ORDER — FUROSEMIDE 10 MG/ML IJ SOLN
20.0000 mg | Freq: Two times a day (BID) | INTRAMUSCULAR | Status: DC
  Administered 2014-06-27 – 2014-06-28 (×3): 20 mg via INTRAVENOUS
  Filled 2014-06-27 (×6): qty 2

## 2014-06-27 NOTE — ED Provider Notes (Signed)
CSN: [id number]     Arrival date & time 06/27/14  1657 History   First MD Initiated Contact with Patient 06/27/14 1800     Chief Complaint  Patient presents with  . Chest Pain  . Leg Swelling     (Consider location/radiation/quality/duration/timing/severity/associated sxs/prior Treatment) HPI Comments: Patient here complaining of left-sided chest pain which began around 3:00 today. Describes the pain as a sharp and pressure with associated dyspnea. Pain lasted for approximately 2 hours. She took 2 nitroglycerin tablets without relief. She does have a history of CHF but denies any orthopnea dyspnea on exertion. She does note a recent weight gain. Denies any black or bloody stools. No fever but some cough. She is currently pain-free at this time. Saw her Dr. today for increased leg discomfort and was sent her for further evaluation. No pleuritic symptoms at this time  Patient is a 80 y.o. female presenting with chest pain. The history is provided by the patient.  Chest Pain   Past Medical History  Diagnosis Date  . Dyslipidemia   . Hypertension   . Palpitations     PVCs/bigeminy on event in May 2009 revealing this was relatively asymptomatic  . Chest pain     a. 2011 Neg MV;  b. 01/2014 Cath: LM 20-30, LAD nl, D1 nl, LCX nl, OM1 nl, RCA dom 30-75m, PD/PL nl, EF 65%->Med Rx.  . Degenerative joint disease   . Hx of varicella   . History of skin cancer     tafeen/ non melanoma.   . Thrombocytopenia   . Anemia   . Monoclonal gammopathies   . Right lower quadrant abdominal pain 07/19/2012    Recurrent  With nausea    This is new since she had ct for llq pain in [facility]    R/o appendiceal problem  Hernia  Get ct scan  And plan  Fu     . Diastolic CHF, acute on chronic 01/31/2012  . Atrial fibrillation     a. Dx 01/2014->Eliquis started.   Past Surgical History  Procedure Laterality Date  . Cholecystectomy  1999  . Cesarean section      times 2  . Shoulder surgery  1996    Right   .  Cataract extraction      Bilateral  implantt  . Cardioversion N/A 02/26/2014    Procedure: CARDIOVERSION AT BEDSIDE;  Surgeon: Pixie Casino, MD;  Location: [place];  Service: Cardiovascular;  Laterality: N/A;  . Cardioversion N/A 04/22/2014    Procedure: CARDIOVERSION (BEDSIDE);  Surgeon: [name], MD;  Location: [facility] OR;  Service: Cardiovascular;  Laterality: N/A;  . Pacemaker insertion  04/23/2014    STJ Assurity dual chamber pacemaker implanted by Dr Rayann Heman   Family History  Problem Relation Age of Onset  . Coronary artery disease Father   . Heart disease Father   . Lung cancer    . Alzheimer's disease Mother   . Cancer Brother 1    lung cancer  . Heart attack Father    History  Substance Use Topics  . Smoking status: Never Smoker   . Smokeless tobacco: Never Used  . Alcohol Use: No   OB History   Grav Para Term Preterm Abortions TAB SAB Ect Mult Living                 Review of Systems  Cardiovascular: Positive for chest pain.  All other systems reviewed and are negative.     Allergies  Review  of patient's allergies indicates no known allergies.  Home Medications   Prior to Admission medications   Medication Sig Start Date End Date Taking? Authorizing Provider  apixaban (ELIQUIS) 2.5 MG TABS tablet Take 1 tablet (2.5 mg total) by mouth 2 (two) times daily. 05/02/14   Domenic Polite, MD  aspirin EC 81 MG EC tablet Take 1 tablet (81 mg total) by mouth daily. 02/27/14   Tarri Fuller, PA-C  CALCIUM PO Take 600 mg by mouth daily.     Historical Provider, MD  furosemide (LASIX) 20 MG tablet Take 1 tablet (20 mg total) by mouth 2 (two) times daily. 06/23/14   Fay Records, MD  levothyroxine (SYNTHROID, LEVOTHROID) 75 MCG tablet Take 1 tablet (75 mcg total) by mouth daily. 01/16/14   Fay Records, MD  loratadine (CLARITIN) 10 MG tablet Take 10 mg by mouth daily.    Historical Provider, MD  metoprolol tartrate (LOPRESSOR) 25 MG tablet Take 0.5 tablets (12.5 mg total) by  mouth 2 (two) times daily. 06/11/14   Liliane Shi, PA-C  nitroGLYCERIN (NITROSTAT) 0.4 MG SL tablet Place 1 tablet (0.4 mg total) under the tongue every 5 (five) minutes as needed. For chest pain 12/02/13   Fay Records, MD  simvastatin (ZOCOR) 10 MG tablet Take 1 tablet (10 mg total) by mouth at bedtime. 05/29/14   Liliane Shi, PA-C   BP 123/48  Temp(Src) 98.8 F (37.1 C) (Oral)  Resp 22  Ht 4\' 9"  (1.448 m)  Wt 153 lb 11.2 oz (69.718 kg)  BMI 33.25 kg/m2  SpO2 96% Physical Exam  Nursing note and vitals reviewed. Constitutional: She is oriented to person, place, and time. She appears well-developed and well-nourished.  Non-toxic appearance. No distress.  HENT:  Head: Normocephalic and atraumatic.  Eyes: Conjunctivae, EOM and lids are normal. Pupils are equal, round, and reactive to light.  Neck: Normal range of motion. Neck supple. No tracheal deviation present. No mass present.  Cardiovascular: Normal rate, regular rhythm and normal heart sounds.  Exam reveals no gallop.   No murmur heard. Pulmonary/Chest: Effort normal and breath sounds normal. No stridor. No respiratory distress. She has no decreased breath sounds. She has no wheezes. She has no rhonchi. She has no rales.  Abdominal: Soft. Normal appearance and bowel sounds are normal. She exhibits no distension. There is no tenderness. There is no rebound and no CVA tenderness.  Musculoskeletal: Normal range of motion. She exhibits no edema and no tenderness.  Neurological: She is alert and oriented to person, place, and time. She has normal strength. No cranial nerve deficit or sensory deficit. GCS eye subscore is 4. GCS verbal subscore is 5. GCS motor subscore is 6.  Skin: Skin is warm and dry. No abrasion and no rash noted.  Psychiatric: She has a normal mood and affect. Her speech is normal and behavior is normal.    ED Course  Procedures (including critical care time) Labs Review Labs Reviewed  CBC - Abnormal; Notable for  the following:    RBC 3.60 (*)    Hemoglobin 9.7 (*)    HCT 31.2 (*)    All other components within normal limits  BASIC METABOLIC PANEL - Abnormal; Notable for the following:    Glucose, Bld 118 (*)    BUN 28 (*)    Creatinine, Ser 1.54 (*)    GFR calc non Af Amer 29 (*)    GFR calc Af Amer 34 (*)    All other  components within normal limits  PRO B NATRIURETIC PEPTIDE  I-STAT TROPOININ, ED    Imaging Review No results found.   EKG Interpretation   Date/Time:  Friday June 27 2014 17:01:55 EDT Ventricular Rate:  81 PR Interval:    QRS Duration: 86 QT Interval:  322 QTC Calculation: 374 R Axis:   45 Text Interpretation:  Atrial fibrillation with premature ventricular or  aberrantly conducted complexes Nonspecific ST and T wave abnormality ,  probably digitalis effect Abnormal ECG Confirmed by Zenia Resides  MD, Adana Marik  ([id number]) on 06/27/2014 6:19:17 PM      MDM   Final diagnoses:  None    Patient to be admitted for further evaluation of her chest pain    Leota Jacobsen, MD 06/27/14 2025

## 2014-06-27 NOTE — Progress Notes (Signed)
Garret Reddish, MD Phone: [phone]  Subjective:   [name] is a 80 y.o. year old very pleasant female patient who presents with the following:  Right upper shoulder pain Shortness of Breath Chest Pain Started about a week ago. Comes on for 5-10 minutes about once an hour. Feels like area is raised up and tight when it occurs. Goes away on its own. Not clear what causes it but comes on randomly. Up to 6/10 burning pain.   Does describes some mild shorness of breath over last day. She noted mild chest discomfort in upper chest this morning lasting about an hour. DId not take nitroglyerin. Symptoms resolved within an hour. Initial pulse ox at home was 90% (not clear if had good waveform). Still feels mildly short of breath. Has bene sleeping on 2 pillows instead of 1. No PND. States gained 2 lbs in 2 days. Small swelling in legs.   ROS- No fever/chills/ expanding redness or redness  Past Medical History- diastolic CHF, pacemaker, CKD III, hx NSTEMI, HLD, HTN  Medications- reviewed and updated Current Outpatient Prescriptions  Medication Sig Dispense Refill  . apixaban (ELIQUIS) 2.5 MG TABS tablet Take 1 tablet (2.5 mg total) by mouth 2 (two) times daily.      Marland Kitchen aspirin EC 81 MG EC tablet Take 1 tablet (81 mg total) by mouth daily.      Marland Kitchen CALCIUM PO Take 600 mg by mouth daily.       . furosemide (LASIX) 20 MG tablet Take 1 tablet (20 mg total) by mouth 2 (two) times daily.  60 tablet  3  . levothyroxine (SYNTHROID, LEVOTHROID) 75 MCG tablet Take 1 tablet (75 mcg total) by mouth daily.  90 tablet  2  . loratadine (CLARITIN) 10 MG tablet Take 10 mg by mouth daily.      . metoprolol tartrate (LOPRESSOR) 25 MG tablet Take 0.5 tablets (12.5 mg total) by mouth 2 (two) times daily.      . simvastatin (ZOCOR) 10 MG tablet Take 1 tablet (10 mg total) by mouth at bedtime.      . nitroGLYCERIN (NITROSTAT) 0.4 MG SL tablet Place 1 tablet (0.4 mg total) under the tongue every 5 (five)  minutes as needed. For chest pain  25 tablet  6   No current facility-administered medications for this visit.    Objective: BP 128/70  Pulse 60  Temp(Src) 98.3 F (36.8 C)  Ht 4\' 10"  (1.473 m)  Wt 156 lb (70.761 kg)  BMI 32.61 kg/m2  SpO2 96% with good wave form. Unsure of wave form on reported 90% at home or in office.  Gen: NAD, resting comfortably in chair CV: irregularly irregular, no murmurs, no JVD Lungs: CTAB no crackles, wheeze, rhonchi Abdomen: soft/nontender/nondistended/normal bowel sounds. Ext: trace edema Skin: warm, dry Shoulder: no rash or lesions over shoulder, mild tenderness to palpation of trapezius but no nodularity or obvious spasm.  Neuro: normal strength in arms and intact distal sensation  Assessment/Plan:  Right upper shoulder pain No palpable nodule on exam today. Unclear what would pop up and down as she describes other than possible muscle spasm. She is tight over trapezius. Encouraged warm compresses.   Diastolic CHF, acute on chronic Weight up 2 lbs in 2 days per patient. Orthopnea with 2 pillows. Slight increase in edema. Patient reporting mild increase in shortness of breath.  Encouraged use of lasix 40 mg in AM and 20 mg 6 hours later for next 4 days and follow up  early next week. Red flags given for seeking care over weekend.   CHEST PAIN-UNSPECIFIED Had mild upper chest pain this morning which she did not take nitroglycerin for which resolved on its own in an hour. Encouraged patient to use nitroglycerin and if no improvement in chest pain if recurs to seek immediate care.

## 2014-06-27 NOTE — H&P (Addendum)
[facility] History and Physical  [name] QAS:[id number] DOB: October 31, 1925 DOA: 06/27/2014  Referring physician: Dr Zenia Resides - ED PCP: [name], MD  Specialists: None  Chief Complaint: CP and SOB  Assessment/Plan Active Problems:   Chest pain Systolic CHF exacerbation CP r/o Hypothyroidism Afib.  Sinus node dysfunction s/p pacemaker placement  Acute on chronic diastolic CHF: BNP 7989. Wt up 4 lbs from baseline. Echo 02/2014 EF 60-65%.  - Admit to Tele - Daily wts - strict I/O - Lasix 20 IV BID  CP: CHF exacerbation vs, ischemia vs msk vs pacemaker (complications w/ initial placement). POC trop neg. EKG w/ Afib and numerou PVC, no evidence of ischemia.  - Cycle Trop x3 - Tele - CHF workup as above - Nitro PRN - GI cocktail - Morphine IV PRN - cont home lopressor  Afib: Currently in Afib. Rate appropriate - Tele - continue Eliquis (may need to hold if Hgb drops more) - continue home lopressor  AoCKD: Cr 1.54 on admission. Baseline around 1.2. Likely to remain elevated due to diuresis.  - BMET in am  Hypothyroidism: Last TSH in range.  - cont home synthroid  Anemia: Hgb of 9.7 on admission. Baseline around 12. Normocytic. Intermittent anemia in the past. On Eliquis.  - Anemia panel - Stool guaiac  - CBC in am  DVT Prophylaxis: on eliquis  Code Status: Limited - No intubation  Family Communication: No family available at time of admission  Disposition Plan: pending CP r/o and CHF exacerbation improvement   HPI: [name] is a 80 y.o. female came to [facility] ed 06/27/2014 with  CP and SOB. Started at 14:30. Took 2 nitro w/o relief. Denies diaphoresis, palpitations, syncope. Noted 4lb wt gain in last couple of days. Pt states that her wt typically doesn't fluctuate. Takes lasix 20mg  twice a day. Did not take extra today. No home O2, but noted that Os sat at home was 90%. Now sleeping on 2 pillow at night instead of 1.  Denies dark or  melanic stools.    Review of Systems: Per HPI w/ all other systems negative.   Past Medical History  Diagnosis Date  . Dyslipidemia   . Hypertension   . Palpitations     PVCs/bigeminy on event in May 2009 revealing this was relatively asymptomatic  . Chest pain     a. 2011 Neg MV;  b. 01/2014 Cath: LM 20-30, LAD nl, D1 nl, LCX nl, OM1 nl, RCA dom 30-30m, PD/PL nl, EF 65%->Med Rx.  . Degenerative joint disease   . Hx of varicella   . History of skin cancer     tafeen/ non melanoma.   . Thrombocytopenia   . Anemia   . Monoclonal gammopathies   . Right lower quadrant abdominal pain 07/19/2012    Recurrent  With nausea    This is new since she had ct for llq pain in [facility]    R/o appendiceal problem  Hernia  Get ct scan  And plan  Fu     . Diastolic CHF, acute on chronic 01/31/2012  . Atrial fibrillation     a. Dx 01/2014->Eliquis started.   Past Surgical History  Procedure Laterality Date  . Cholecystectomy  1999  . Cesarean section      times 2  . Shoulder surgery  1996    Right   . Cataract extraction      Bilateral  implantt  . Cardioversion N/A 02/26/2014    Procedure: CARDIOVERSION AT  BEDSIDE;  Surgeon: Pixie Casino, MD;  Location: [place];  Service: Cardiovascular;  Laterality: N/A;  . Cardioversion N/A 04/22/2014    Procedure: CARDIOVERSION (BEDSIDE);  Surgeon: [name], MD;  Location: [facility] OR;  Service: Cardiovascular;  Laterality: N/A;  . 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 Eats with her.    No Known Allergies  Family History  Problem Relation Age of Onset  . Coronary artery disease Father   . Heart disease Father    . Lung cancer    . Alzheimer's disease Mother   . Cancer Brother 49    lung cancer  . Heart attack Father      Prior to Admission medications   Medication Sig Start Date End Date Taking? Authorizing Provider  apixaban (ELIQUIS) 2.5 MG TABS tablet Take 1 tablet (2.5 mg total) by mouth 2 (two) times daily. 05/02/14  Yes Domenic Polite, MD  aspirin EC 81 MG EC tablet Take 1 tablet (81 mg total) by mouth daily. 02/27/14  Yes Tarri Fuller, PA-C  CALCIUM PO Take 600 mg by mouth daily.    Yes Historical Provider, MD  furosemide (LASIX) 20 MG tablet Take 1 tablet (20 mg total) by mouth 2 (two) times daily. 06/23/14  Yes Fay Records, MD  levothyroxine (SYNTHROID, LEVOTHROID) 75 MCG tablet Take 1 tablet (75 mcg total) by mouth daily. 01/16/14  Yes Fay Records, MD  loratadine (CLARITIN) 10 MG tablet Take 10 mg by mouth daily as needed for allergies.    Yes Historical Provider, MD  metoprolol tartrate (LOPRESSOR) 25 MG tablet Take 0.5 tablets (12.5 mg total) by mouth 2 (two) times daily. 06/11/14  Yes Scott T Kathlen Mody, PA-C  nitroGLYCERIN (NITROSTAT) 0.4 MG SL tablet Place 1 tablet (0.4 mg total) under the tongue every 5 (five) minutes as needed. For chest pain 12/02/13  Yes Fay Records, MD  simvastatin (ZOCOR) 10 MG tablet Take 1 tablet (10 mg total) by mouth at bedtime. 05/29/14  Yes Liliane Shi, PA-C   Physical Exam: Filed Vitals:   06/27/14 1845 06/27/14 1930 06/27/14 2030 06/27/14 2126  BP: 99/85 142/83 107/56 106/55  Pulse: 90 93 92 77  Temp:      TempSrc:      Resp: 19 22 22    Height:      Weight:      SpO2: 93% 95% 95% 96%     General:  NAD, WNWD, Elderly  Eyes: EOMI, PERRL  ENT: MMM  Neck: soft, FROM, No JVD  Cardiovascular: regularly irregular, no m/r/g  Respiratory: CTAB bilat. nml effort. On RA  Abdomen: Nttp, soft  Skin: intact, no rash  Musculoskeletal: 1+ LE edema  Psychiatric: nmnl affect  Neurologic: CN 2-12 grossly intact moves all extremities spontaneously     Labs on Admission:  Basic Metabolic Panel:  Recent Labs Lab 06/27/14 1721  NA 140  K 4.1  CL 101  CO2 26  GLUCOSE 118*  BUN 28*  CREATININE 1.54*  CALCIUM 9.7   Liver Function Tests:  No results found for this basename: AST, ALT, ALKPHOS, BILITOT, PROT, ALBUMIN,  in the last 168 hours No results found for this basename: LIPASE, AMYLASE,  in the last 168 hours No results found for this basename: AMMONIA,  in the last 168 hours CBC:  Recent Labs Lab 06/27/14 1721  WBC 6.3  HGB 9.7*  HCT 31.2*  MCV 86.7  PLT 167   Cardiac Enzymes: No results found for this basename: CKTOTAL, CKMB, CKMBINDEX, TROPONINI,  in the last 168 hours  BNP (last 3 results)  Recent Labs  05/08/14 1727 06/16/14 1237 06/27/14 1721  PROBNP 2511.0* 543.0* 1950.0*   CBG: No results found for this basename: GLUCAP,  in the last 168 hours  Radiological Exams on Admission: Dg Chest 2 View  06/27/2014   CLINICAL DATA:  Right shoulder pain for 2 days.  EXAM: CHEST  2 VIEW  COMPARISON:  CT chest 04/25/2014. Single view of the chest 05/08/2014.  FINDINGS: Lung volumes are lower than on the comparison plain film of the chest with crowding of the bronchovascular structures. Heart size is enlarged but there is no pulmonary edema. No pneumothorax or pleural effusion.  IMPRESSION: Cardiomegaly without acute disease.   Electronically Signed   By: Inge Rise M.D.   On: 06/27/2014 19:19        Time spent:  > 70 min in direct pt care and counseling   MERRELL, DAVID J, MD [facility] [email or web address] Password [facility] 06/27/2014, 9:40 PM

## 2014-06-27 NOTE — ED Notes (Signed)
Patient C/O right shoulder pain X 2 days.  States that she went to see her MD who examined her and sent her home.  States that she began having pain across her chest.  Patient indicates that the pain is across her lower rib cage. Pain worsens when she takes a deep breath.  Lower costal margin is tender to palpation. Patient has a right chest pacemaker that was placed April 19, 2014

## 2014-06-27 NOTE — ED Notes (Signed)
Pt c/o Left upper chest pain that started today around 3pm, sts she went to her PCP, already had a doctors appointment for increased leg swelling. Pt sts she has gained 2 lbs in the past few days. Hx of CHF. Pt sts she took 2 nitro, no change in pain level. Hx of a.fib and is on a blood thinner. Nad, skin warm and dry, resp e/u.

## 2014-06-27 NOTE — Patient Instructions (Signed)
Raised area on neck  Suspect this is muscle spasm.   Would try a warm compress directly on the area when it occurs  Shortness of breath  You have gained 2 lbs and have little more swelling so let's take 40 mg of lasix in morning and 20mg  later in the day for the next 3 days.   Let's check in next Monday or Tuesday  If your symptoms worsen or if you have chest pain not relieved by nitroglycerin, I ask that you seek care through ER immediately  Very nice to meet you, [name]

## 2014-06-27 NOTE — Progress Notes (Signed)
Patient B/P 108/51 and Metoprolol 12.5 mg ordered, MD notified, ordered to hold dose. Will continue to monitor patient.

## 2014-06-27 NOTE — Assessment & Plan Note (Addendum)
Weight up 2 lbs in 2 days per patient. Orthopnea with 2 pillows. Slight increase in edema. Patient reporting mild increase in shortness of breath.  Encouraged use of lasix 40 mg in AM and 20 mg 6 hours later for next 4 days and follow up early next week. Red flags given for seeking care over weekend.

## 2014-06-27 NOTE — Telephone Encounter (Signed)
New Prob  Pt has some questions regarding recent medication changes her PCP suggested. Please call.

## 2014-06-27 NOTE — Assessment & Plan Note (Signed)
Had mild upper chest pain this morning which she did not take nitroglycerin for which resolved on its own in an hour. Encouraged patient to use nitroglycerin and if no improvement in chest pain if recurs to seek immediate care.

## 2014-06-28 ENCOUNTER — Inpatient Hospital Stay (HOSPITAL_COMMUNITY): Payer: Medicare Other

## 2014-06-28 DIAGNOSIS — I509 Heart failure, unspecified: Secondary | ICD-10-CM

## 2014-06-28 DIAGNOSIS — R0789 Other chest pain: Secondary | ICD-10-CM

## 2014-06-28 DIAGNOSIS — I5033 Acute on chronic diastolic (congestive) heart failure: Principal | ICD-10-CM

## 2014-06-28 DIAGNOSIS — R0602 Shortness of breath: Secondary | ICD-10-CM

## 2014-06-28 DIAGNOSIS — I4891 Unspecified atrial fibrillation: Secondary | ICD-10-CM

## 2014-06-28 LAB — CBC
HEMATOCRIT: 28.4 % — AB (ref 36.0–46.0)
Hemoglobin: 8.9 g/dL — ABNORMAL LOW (ref 12.0–15.0)
MCH: 27.2 pg (ref 26.0–34.0)
MCHC: 31.3 g/dL (ref 30.0–36.0)
MCV: 86.9 fL (ref 78.0–100.0)
Platelets: 147 10*3/uL — ABNORMAL LOW (ref 150–400)
RBC: 3.27 MIL/uL — ABNORMAL LOW (ref 3.87–5.11)
RDW: 15.8 % — AB (ref 11.5–15.5)
WBC: 7.7 10*3/uL (ref 4.0–10.5)

## 2014-06-28 LAB — BASIC METABOLIC PANEL
Anion gap: 12 (ref 5–15)
BUN: 27 mg/dL — AB (ref 6–23)
CALCIUM: 9.4 mg/dL (ref 8.4–10.5)
CO2: 25 mEq/L (ref 19–32)
CREATININE: 1.32 mg/dL — AB (ref 0.50–1.10)
Chloride: 101 mEq/L (ref 96–112)
GFR calc Af Amer: 41 mL/min — ABNORMAL LOW (ref >90)
GFR, EST NON AFRICAN AMERICAN: 35 mL/min — AB (ref >90)
GLUCOSE: 165 mg/dL — AB (ref 70–99)
Potassium: 4.3 mEq/L (ref 3.7–5.3)
Sodium: 138 mEq/L (ref 137–147)

## 2014-06-28 LAB — IRON AND TIBC
Iron: 13 ug/dL — ABNORMAL LOW (ref 42–135)
Saturation Ratios: 3 % — ABNORMAL LOW (ref 20–55)
TIBC: 431 ug/dL (ref 250–470)
UIBC: 418 ug/dL — ABNORMAL HIGH (ref 125–400)

## 2014-06-28 LAB — TROPONIN I

## 2014-06-28 LAB — TSH: TSH: 2.56 u[IU]/mL (ref 0.350–4.500)

## 2014-06-28 LAB — VITAMIN B12: VITAMIN B 12: 386 pg/mL (ref 211–911)

## 2014-06-28 LAB — FOLATE

## 2014-06-28 LAB — FERRITIN: Ferritin: 23 ng/mL (ref 10–291)

## 2014-06-28 MED ORDER — HYDROCODONE-ACETAMINOPHEN 5-325 MG PO TABS: 1.0000 | ORAL_TABLET | ORAL | Status: DC | PRN

## 2014-06-28 MED ORDER — DOCUSATE SODIUM 100 MG PO CAPS
200.0000 mg | ORAL_CAPSULE | Freq: Two times a day (BID) | ORAL | Status: DC
  Administered 2014-06-28 – 2014-06-30 (×4): 200 mg via ORAL
  Filled 2014-06-28 (×5): qty 2

## 2014-06-28 MED ORDER — AMIODARONE HCL 200 MG PO TABS
200.0000 mg | ORAL_TABLET | Freq: Two times a day (BID) | ORAL | Status: DC
  Administered 2014-06-28 – 2014-06-30 (×5): 200 mg via ORAL
  Filled 2014-06-28 (×6): qty 1

## 2014-06-28 MED ORDER — POLYETHYLENE GLYCOL 3350 17 G PO PACK
17.0000 g | PACK | Freq: Two times a day (BID) | ORAL | Status: DC
  Administered 2014-06-28 – 2014-06-29 (×2): 17 g via ORAL
  Filled 2014-06-28 (×5): qty 1

## 2014-06-28 MED ORDER — METOPROLOL TARTRATE 12.5 MG HALF TABLET
12.5000 mg | ORAL_TABLET | Freq: Two times a day (BID) | ORAL | Status: DC
  Administered 2014-06-28 – 2014-06-30 (×3): 12.5 mg via ORAL
  Filled 2014-06-28 (×5): qty 1

## 2014-06-28 MED ORDER — METOPROLOL TARTRATE 25 MG PO TABS
25.0000 mg | ORAL_TABLET | Freq: Two times a day (BID) | ORAL | Status: DC
  Filled 2014-06-28 (×2): qty 1

## 2014-06-28 NOTE — Consult Note (Addendum)
Admit date: 06/27/2014 Referring Physician  Dr. Marily Memos Primary Physician [name], MD Primary Cardiologist  Dr. Harrington Challenger, EP Dr. Lovena Le Reason for Consultation  chest pain and shortness of breath  HPI: 80 year old female status post pacemaker with chest pain, shortness of breath, likely diastolic heart failure with history of atrial fibrillation.  She complained of both chest pain and shortness of breath which started approximately 2:30 PM on 06/27/14, took 2 nitroglycerin without any significant relief. Over the last few day she has noted an increase in weight of approximately 5 pounds. Her weight typically does not fluctuate. She usually takes Lasix 20 mg twice a day. Oxygen saturation was 90% in the emergency department and she is sleeping on 2 pillows. No recent bleeding episodes. No fevers, no chills, no cough.  She is currently on Eliquis for anticoagulation for her atrial fibrillation. Hemoglobin 9.7, baseline is 12.  Her last office visit on 06/10/14 with Richardson Dopp, PA was reviewed. She has a history of nonobstructive CAD by cardiac catheterization in 3/15. Chronic kidney disease as well. She was admitted twice in June of 2015 with acute on chronic diastolic heart failure. She underwent cardioversion with restoration of normal sinus rhythm which resulted in profound bradycardia/pacemaker implantation. She had acute blood loss requiring transfusion.  Studies:  - LHC (3/15): Mid LM 20-30%, mid RCA 30-40%, EF 65%  - Echo (4/15): Mild LVH, EF 60-65%, no RWMA, mild BAE, trivial eff  - Carotid US (5/15): 40-59% RICA stenosis. 0-81% LICA stenosis.     PMH:   Past Medical History  Diagnosis Date  . Dyslipidemia   . Hypertension   . Palpitations     PVCs/bigeminy on event in May 2009 revealing this was relatively asymptomatic  . Chest pain     a. 2011 Neg MV;  b. 01/2014 Cath: LM 20-30, LAD nl, D1 nl, LCX nl, OM1 nl, RCA dom 30-20m, PD/PL nl, EF 65%->Med Rx.  . Degenerative  joint disease   . Hx of varicella   . History of skin cancer     tafeen/ non melanoma.   . Thrombocytopenia   . Anemia   . Monoclonal gammopathies   . Right lower quadrant abdominal pain 07/19/2012    Recurrent  With nausea    This is new since she had ct for llq pain in [facility]    R/o appendiceal problem  Hernia  Get ct scan  And plan  Fu     . Diastolic CHF, acute on chronic 01/31/2012  . Atrial fibrillation     a. Dx 01/2014->Eliquis started.    PSH:   Past Surgical History  Procedure Laterality Date  . Cholecystectomy  1999  . Cesarean section      times 2  . Shoulder surgery  1996    Right   . Cataract extraction      Bilateral  implantt  . Cardioversion N/A 02/26/2014    Procedure: CARDIOVERSION AT BEDSIDE;  Surgeon: Pixie Casino, MD;  Location: [place];  Service: Cardiovascular;  Laterality: N/A;  . Cardioversion N/A 04/22/2014    Procedure: CARDIOVERSION (BEDSIDE);  Surgeon: [name], MD;  Location: [facility];  Service: Cardiovascular;  Laterality: N/A;  . Pacemaker insertion  04/23/2014    STJ Assurity dual chamber pacemaker implanted by Dr Rayann Heman   Allergies:  Review of patient's allergies indicates no known allergies. Prior to Admit Meds:   Prior to Admission medications   Medication Sig Start Date End Date Taking? Authorizing Provider  apixaban (ELIQUIS) 2.5 MG TABS tablet Take 1 tablet (2.5 mg total) by mouth 2 (two) times daily. 05/02/14  Yes Domenic Polite, MD  aspirin EC 81 MG EC tablet Take 1 tablet (81 mg total) by mouth daily. 02/27/14  Yes Tarri Fuller, PA-C  CALCIUM PO Take 600 mg by mouth daily.    Yes Historical Provider, MD  furosemide (LASIX) 20 MG tablet Take 1 tablet (20 mg total) by mouth 2 (two) times daily. 06/23/14  Yes Fay Records, MD  levothyroxine (SYNTHROID, LEVOTHROID) 75 MCG tablet Take 1 tablet (75 mcg total) by mouth daily. 01/16/14  Yes Fay Records, MD  loratadine (CLARITIN) 10 MG tablet Take 10 mg by mouth daily as needed for allergies.    Yes  Historical Provider, MD  metoprolol tartrate (LOPRESSOR) 25 MG tablet Take 0.5 tablets (12.5 mg total) by mouth 2 (two) times daily. 06/11/14  Yes Scott T Kathlen Mody, PA-C  nitroGLYCERIN (NITROSTAT) 0.4 MG SL tablet Place 1 tablet (0.4 mg total) under the tongue every 5 (five) minutes as needed. For chest pain 12/02/13  Yes Fay Records, MD  simvastatin (ZOCOR) 10 MG tablet Take 1 tablet (10 mg total) by mouth at bedtime. 05/29/14  Yes Liliane Shi, PA-C   Fam HX:    Family History  Problem Relation Age of Onset  . Coronary artery disease Father   . Heart disease Father   . Lung cancer    . Alzheimer's disease Mother   . Cancer Brother 24    lung cancer  . Heart attack Father    Social HX:    History   Social History  . Marital Status: Widowed    Spouse Name: N/A    Number of Children: 3  . Years of Education: N/A   Occupational History  .      [employer], book keeping   Social History Main Topics  . Smoking status: Never Smoker   . Smokeless tobacco: Never Used  . Alcohol Use: No  . Drug Use: No  . Sexual Activity: Not Currently   Other Topics Concern  . 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 Eats with her.     ROS:  Positive for neck, back pain, chronic. Previous chest pain. All 11 ROS were addressed and are negative except what is stated in the HPI   Physical Exam: Blood pressure 102/63, pulse 121, temperature 98.5 F (36.9 C), temperature source Oral, resp. rate 17, height 4\' 9"  (1.448 m), weight 156 lb 12.8 oz (71.124 kg), SpO2 92.00%.   General: Elderly well nourished, in no acute distress Head: Eyes PERRLA, No xanthomas.   Normal cephalic and atramatic  Lungs:   Clear bilaterally to auscultation and percussion. Normal  respiratory effort. No wheezes, no rales. Heart:   Mostly regular with occasional ectopy (currently paced with occasional PVC) Pulses are 2+ & equal. No murmur, rubs, gallops.  No carotid bruit. No JVD.  No abdominal bruits.  Abdomen: Bowel sounds are positive, abdomen soft and non-tender without masses. No hepatosplenomegaly. Msk:  Back normal. Normal strength and tone for age. Heating pad Extremities:  No clubbing, cyanosis  or edema.  DP +1 Neuro: Alert and oriented X 3, non-focal, MAE x 4 GU: Deferred Rectal: Deferred Psych:  Good affect, responds appropriately      Labs: Lab Results  Component Value Date   WBC 7.7 06/28/2014   HGB 8.9* 06/28/2014   HCT 28.4* 06/28/2014   MCV 86.9 06/28/2014   PLT 147* 06/28/2014     Recent Labs Lab 06/28/14 0417  NA 138  K 4.3  CL 101  CO2 25  BUN 27*  CREATININE 1.32*  CALCIUM 9.4  GLUCOSE 165*    Recent Labs  06/27/14 2256 06/28/14 0417  TROPONINI <0.30 <0.30   Lab Results  Component Value Date   CHOL 141 04/14/2014   HDL 89 04/14/2014   LDLCALC 41 04/14/2014   TRIG 56 04/14/2014    Recent Labs:  04/13/2014: TSH 4.450  04/14/2014: ALT 8; HDL Cholesterol by NMR 89; LDL (calc) 41  05/08/2014: Creatinine 1.60*; Hemoglobin 13.6; Potassium 4.0; Pro B Natriuretic peptide (BNP) 2511.0*   Radiology:  Dg Chest 2 View  06/27/2014   CLINICAL DATA:  Right shoulder pain for 2 days.  EXAM: CHEST  2 VIEW  COMPARISON:  CT chest 04/25/2014. Single view of the chest 05/08/2014.  FINDINGS: Lung volumes are lower than on the comparison plain film of the chest with crowding of the bronchovascular structures. Heart size is enlarged but there is no pulmonary edema. No pneumothorax or pleural effusion.  IMPRESSION: Cardiomegaly without acute disease.   Electronically Signed   By: Inge Rise M.D.   On: 06/27/2014 19:19   Personally viewed.  EKG:   06/27/14-atrial fibrillation with frequent PVCs, nonspecific ST-T wave changes. When compared to prior on  05/08/14-PVCs are more frequent. Personally viewed.   ASSESSMENT/PLAN:    80 year old female with nonobstructive coronary artery disease her recent heart catheterization with pacemaker implantation secondary to bradycardia, atrial fibrillation on chronic anticoagulation, PVCs and prior hospitalization/ER visits for chest pain/acute diastolic heart failure.  1. Acute diastolic heart failure-it is possible that her shortness of breath he is in part secondary to increased weight. I agree with Lasix. Her blood pressure is limiting on increase in beta blocker. In late July, Richardson Dopp discontinued her amiodarone because of her 100% atrial fibrillation noted on pacemaker. With her more challenging rate control issues currently, more frequent PVCs, I will resume amiodarone at 200 mg twice a day. Certainly her atrial fibrillation could be playing a role in her recurrent symptoms of diastolic heart failure however her weight gain is likely the main culprit. Given her GFR of 38, I do not think that dofetilide would be a great option for her.  2. Pacemaker-functioning normally. Dr. Lovena Le.  3. Chronic anticoagulation-we'll continue with Eliquis 2.5 mg twice a day, low dose given her prior bouts of anemia, current anemia, as well as chronic kidney disease.  4. Hyperlipidemia-continue with low dose simvastatin.  5. Chest pain-atypical. Likely musculoskeletal. Heating pad okay. Troponin normal.  6. Anemia-continue to monitor closely. She is on  anticoagulation.  We will follow.  Candee Furbish, MD  06/28/2014  9:34 AM

## 2014-06-28 NOTE — Progress Notes (Signed)
Patient Demographics  [name], is a 80 y.o. female, DOB - 1925-04-12, KPT:[id number]  Admit date - 06/27/2014   Admitting Physician Waldemar Dickens, MD  Outpatient Primary MD for the patient is [name], MD  LOS - 1   Chief Complaint  Patient presents with  . Chest Pain  . Leg Swelling        Subjective:   [name] today has, No headache, mild musculoskeletal chest pain, No abdominal pain - No Nausea, No new weakness tingling or numbness, No Cough - SOB.    Assessment & Plan    1. Acute on chronic diastolic CHF EF 17% on recent echo. Continue Lasix, blood pressure on the softer side, low-dose beta blocker if it can tolerate, salt and fluid restriction, oxygen nebulizer treatments as needed. Cardiology following.   2. A. fib with RVR. Discussed with cardiology, since blood pressure is low we will resume amiodarone which was stopped recently, continue Eliquis. Monitor rate.    3. Musculoskeletal chest pain. Due to shortness of breath and mild cough, repeat 2 view chest x-ray, troponin negative x2. Supportive care.    4. Chronic Kidney disease stage III. Baseline creatinine around 1.3-1.5. At this line. Monitor.    5. Dyslipidemia continue home dose statin.    6. Hypothyroidism. On home dose Synthroid will check TSH.   Lab Results  Component Value Date   TSH 4.450 04/13/2014       Code Status: Partial  Family Communication: none present  Disposition Plan: Home   Procedures recent TEE noted with EF 00% and  diastolic dysfunction   Consults cardiology   Medications  Scheduled Meds: . amiodarone  200 mg Oral BID  . apixaban  2.5 mg Oral BID  . aspirin EC  81 mg Oral Daily  . furosemide  20 mg Intravenous Q12H  . levothyroxine  75 mcg  Oral QAC breakfast  . metoprolol tartrate  25 mg Oral BID  . simvastatin  10 mg Oral QHS  . sodium chloride  3 mL Intravenous Q12H   Continuous Infusions:  PRN Meds:.sodium chloride, acetaminophen, gi cocktail, HYDROcodone-acetaminophen, morphine injection, ondansetron (ZOFRAN) IV, sodium chloride  DVT Prophylaxis Eliquis  Lab Results  Component Value Date   PLT 147* 06/28/2014    Antibiotics    Anti-infectives   None          Objective:   Filed Vitals:   06/27/14 2126 06/27/14 2330 06/28/14 0400 06/28/14 0813  BP: 106/55 108/51 117/69 102/63  Pulse: 77  101 121  Temp:   98.1 F (36.7 C) 98.5 F (36.9 C)  TempSrc:   Oral Oral  Resp:   20 17  Height:   4\' 9"  (1.448 m)   Weight:   71.124 kg (156 lb 12.8 oz)   SpO2: 96%  94% 92%    Wt Readings from Last 3 Encounters:  06/28/14 71.124 kg (156 lb 12.8 oz)  06/27/14 70.761 kg (156 lb)  06/16/14 69.854 kg (154 lb)     Intake/Output Summary (Last 24 hours) at 06/28/14 1009 Last data filed at 06/28/14 0421  Gross per 24 hour  Intake      0 ml  Output    200 ml  Net   -  200 ml     Physical Exam  Awake Alert, Oriented X 3, No new F.N deficits, Normal affect [name].AT,PERRAL Supple Neck,No JVD, No cervical lymphadenopathy appriciated.  Symmetrical Chest wall movement, reduced by basilar breath sounds iRRR,No Gallops,Rubs or new Murmurs, No Parasternal Heave +ve B.Sounds, Abd Soft, No tenderness, No organomegaly appriciated, No rebound - guarding or rigidity. No Cyanosis, Clubbing or edema, No new Rash or bruise      Data Review   Micro Results No results found for this or any previous visit (from the past 240 hour(s)).  Radiology Reports Dg Chest 2 View  06/27/2014   CLINICAL DATA:  Right shoulder pain for 2 days.  EXAM: CHEST  2 VIEW  COMPARISON:  CT chest 04/25/2014. Single view of the chest 05/08/2014.  FINDINGS: Lung volumes are lower than on the comparison plain film of the chest with crowding of the  bronchovascular structures. Heart size is enlarged but there is no pulmonary edema. No pneumothorax or pleural effusion.  IMPRESSION: Cardiomegaly without acute disease.   Electronically Signed   By: Inge Rise M.D.   On: 06/27/2014 19:19    CBC  Recent Labs Lab 06/27/14 1721 06/28/14 0417  WBC 6.3 7.7  HGB 9.7* 8.9*  HCT 31.2* 28.4*  PLT 167 147*  MCV 86.7 86.9  MCH 26.9 27.2  MCHC 31.1 31.3  RDW 15.4 15.8*    Chemistries   Recent Labs Lab 06/27/14 1721 06/28/14 0417  NA 140 138  K 4.1 4.3  CL 101 101  CO2 26 25  GLUCOSE 118* 165*  BUN 28* 27*  CREATININE 1.54* 1.32*  CALCIUM 9.7 9.4   ------------------------------------------------------------------------------------------------------------------ estimated creatinine clearance is 24.5 ml/min (by C-G formula based on Cr of 1.32). ------------------------------------------------------------------------------------------------------------------ No results found for this basename: HGBA1C,  in the last 72 hours ------------------------------------------------------------------------------------------------------------------ No results found for this basename: CHOL, HDL, LDLCALC, TRIG, CHOLHDL, LDLDIRECT,  in the last 72 hours ------------------------------------------------------------------------------------------------------------------ No results found for this basename: TSH, T4TOTAL, FREET3, T3FREE, THYROIDAB,  in the last 72 hours ------------------------------------------------------------------------------------------------------------------  Recent Labs  06/27/14 2256  RETICCTPCT 1.9    Coagulation profile No results found for this basename: INR, PROTIME,  in the last 168 hours  No results found for this basename: DDIMER,  in the last 72 hours  Cardiac Enzymes  Recent Labs Lab 06/27/14 2256 06/28/14 0417  TROPONINI <0.30 <0.30    ------------------------------------------------------------------------------------------------------------------ No components found with this basename: POCBNP,      Time Spent in minutes   35   SINGH,PRASHANT K M.D on 06/28/2014 at 10:09 AM  Between 7am to 7pm - Pager - [phone]  After 7pm go to [email or web address] - password [email or web address]  And look for the night coverage person covering for me after hours  [facility] Office  [phone]   **Disclaimer: This note may have been dictated with voice recognition software. Similar sounding words can inadvertently be transcribed and this note may contain transcription errors which may not have been corrected upon publication of note.**

## 2014-06-28 NOTE — Progress Notes (Signed)
  Amiodarone Drug - Drug Interaction Consult Note  Recommendations: No significant drug interactions on current regimen.  Patient is on Lasix so will monitor for hypokalemia.  Plan to monitor BMP daily and reevaluate drug interactions if any new medications are added.   Amiodarone is metabolized by the cytochrome P450 system and therefore has the potential to cause many drug interactions. Amiodarone has an average plasma half-life of 50 days (range 20 to 100 days).   There is potential for drug interactions to occur several weeks or months after stopping treatment and the onset of drug interactions may be slow after initiating amiodarone.   [x]  Statins: Increased risk of myopathy. Simvastatin- restrict dose to 20mg  daily. Other statins: counsel patients to report any muscle pain or weakness immediately.  Patient on Simvastatin 10 mg, dose okay  []  Anticoagulants: Amiodarone can increase anticoagulant effect. Consider warfarin dose reduction. Patients should be monitored closely and the dose of anticoagulant altered accordingly, remembering that amiodarone levels take several weeks to stabilize.  []  Antiepileptics: Amiodarone can increase plasma concentration of phenytoin, the dose should be reduced. Note that small changes in phenytoin dose can result in large changes in levels. Monitor patient and counsel on signs of toxicity.  []  Beta blockers: increased risk of bradycardia, AV block and myocardial depression. Sotalol - avoid concomitant use.  []   Calcium channel blockers (diltiazem and verapamil): increased risk of bradycardia, AV block and myocardial depression.  []   Cyclosporine: Amiodarone increases levels of cyclosporine. Reduced dose of cyclosporine is recommended.  []  Digoxin dose should be halved when amiodarone is started.  [x]  Diuretics: increased risk of cardiotoxicity if hypokalemia occurs. Patient is on Lasix 20 mg IV Q 12H.  K currently 4.3.  []  Oral hypoglycemic agents  (glyburide, glipizide, glimepiride): increased risk of hypoglycemia. Patient's glucose levels should be monitored closely when initiating amiodarone therapy.   []  Drugs that prolong the QT interval:  Torsades de pointes risk may be increased with concurrent use - avoid if possible.  Monitor QTc, also keep magnesium/potassium WNL if concurrent therapy can't be avoided. Marland Kitchen Antibiotics: e.g. fluoroquinolones, erythromycin. . Antiarrhythmics: e.g. quinidine, procainamide, disopyramide, sotalol. . Antipsychotics: e.g. phenothiazines, haloperidol.  . Lithium, tricyclic antidepressants, and methadone. Thank You,  Theron Arista, PharmD Clinical Pharmacist - Resident Pager: [phone] 8/15/20159:52 AM

## 2014-06-29 LAB — BASIC METABOLIC PANEL
ANION GAP: 9 (ref 5–15)
BUN: 26 mg/dL — ABNORMAL HIGH (ref 6–23)
CHLORIDE: 100 meq/L (ref 96–112)
CO2: 27 mEq/L (ref 19–32)
Calcium: 9.3 mg/dL (ref 8.4–10.5)
Creatinine, Ser: 1.28 mg/dL — ABNORMAL HIGH (ref 0.50–1.10)
GFR, EST AFRICAN AMERICAN: 42 mL/min — AB (ref >90)
GFR, EST NON AFRICAN AMERICAN: 36 mL/min — AB (ref >90)
Glucose, Bld: 115 mg/dL — ABNORMAL HIGH (ref 70–99)
POTASSIUM: 4.1 meq/L (ref 3.7–5.3)
Sodium: 136 mEq/L — ABNORMAL LOW (ref 137–147)

## 2014-06-29 LAB — OCCULT BLOOD X 1 CARD TO LAB, STOOL: Fecal Occult Bld: NEGATIVE

## 2014-06-29 MED ORDER — FUROSEMIDE 20 MG PO TABS
20.0000 mg | ORAL_TABLET | Freq: Every day | ORAL | Status: DC
  Filled 2014-06-29: qty 1

## 2014-06-29 MED ORDER — MAGNESIUM HYDROXIDE 400 MG/5ML PO SUSP
30.0000 mL | Freq: Two times a day (BID) | ORAL | Status: AC
  Administered 2014-06-29: 30 mL via ORAL
  Filled 2014-06-29: qty 30

## 2014-06-29 MED ORDER — FERROUS SULFATE 325 (65 FE) MG PO TABS
325.0000 mg | ORAL_TABLET | Freq: Two times a day (BID) | ORAL | Status: DC
  Administered 2014-06-29 – 2014-06-30 (×2): 325 mg via ORAL
  Filled 2014-06-29 (×4): qty 1

## 2014-06-29 MED ORDER — LORATADINE 10 MG PO TABS
10.0000 mg | ORAL_TABLET | Freq: Every day | ORAL | Status: DC
  Administered 2014-06-29: 10 mg via ORAL
  Filled 2014-06-29 (×2): qty 1

## 2014-06-29 MED ORDER — FUROSEMIDE 20 MG PO TABS
20.0000 mg | ORAL_TABLET | Freq: Two times a day (BID) | ORAL | Status: DC
  Administered 2014-06-29 – 2014-06-30 (×3): 20 mg via ORAL
  Filled 2014-06-29 (×5): qty 1

## 2014-06-29 NOTE — Progress Notes (Signed)
Utilization Review Completed.[name] T8/16/2015  

## 2014-06-29 NOTE — Progress Notes (Signed)
Pt up walked approx 16ft in hallway with rolling walker standby assist. C/o of nausea and weakness following walk pt took MOM this AM and has some GI cramping has had small very hard pellet BM. Cool cloth given up in chair will cont to monitor

## 2014-06-29 NOTE — Progress Notes (Signed)
Patient Demographics  [name], is a 80 y.o. female, DOB - Dec 22, 1924, LEX:[id number]  Admit date - 06/27/2014   Admitting Physician Waldemar Dickens, MD  Outpatient Primary MD for the patient is [name], MD  LOS - 2   Chief Complaint  Patient presents with  . Chest Pain  . Leg Swelling        Subjective:   [name] today has, No headache, no chest pain, No abdominal pain - No Nausea, No new weakness tingling or numbness, No Cough - SOB.  Mild constipation.  Assessment & Plan    1. Acute on chronic diastolic CHF EF 44% on recent echo. Continue Lasix,  low-dose beta blocker if BP can tolerate, salt and fluid restriction, oxygen nebulizer treatments as needed. Cardiology following. Typically much improved.   2. A. fib with RVR. Cardiology following, amiodarone added as blood pressures were low to low dose beta blocker with good effect in rate control, continue Eliquis. Monitor rate.    3. Musculoskeletal chest pain. Due to shortness of breath and mild cough, repeat 2 view chest x-ray, troponin negative x2. Resolved with supportive care.    4. Chronic Kidney disease stage III. Baseline creatinine around 1.3-1.5. At this line. Monitor.    5. Dyslipidemia continue home dose statin.    6. Hypothyroidism. On home dose Synthroid    Lab Results  Component Value Date   TSH 2.560 06/28/2014     7. Mild constipation. Initiate bowel regimen.    8. Iron deficiency anemia. Place on oral iron supplementation outpatient workup by PCP.      Code Status: Partial  Family Communication: none present  Disposition Plan: Home   Procedures recent TEE noted with EF 96% and diastolic dysfunction   Consults Cardiology   Medications  Scheduled Meds: .  amiodarone  200 mg Oral BID  . apixaban  2.5 mg Oral BID  . docusate sodium  200 mg Oral BID  . furosemide  20 mg Oral BID  . levothyroxine  75 mcg Oral QAC breakfast  . loratadine  10 mg Oral QHS  . magnesium hydroxide  30 mL Oral BID  . metoprolol tartrate  12.5 mg Oral BID  . polyethylene glycol  17 g Oral BID  . simvastatin  10 mg Oral QHS  . sodium chloride  3 mL Intravenous Q12H   Continuous Infusions:  PRN Meds:.sodium chloride, acetaminophen, gi cocktail, HYDROcodone-acetaminophen, morphine injection, ondansetron (ZOFRAN) IV, sodium chloride  DVT Prophylaxis Eliquis  Lab Results  Component Value Date   PLT 147* 06/28/2014    Antibiotics    Anti-infectives   None          Objective:   Filed Vitals:   06/29/14 0400 06/29/14 0631 06/29/14 0806 06/29/14 0924  BP: 116/50  110/42 108/41  Pulse: 82  72 66  Temp: 98.9 F (37.2 C)  99.5 F (37.5 C)   TempSrc: Oral  Oral   Resp: 15  16   Height:      Weight:  69.899 kg (154 lb 1.6 oz)    SpO2: 97%  92%     Wt Readings from Last 3 Encounters:  06/29/14 69.899 kg (154 lb 1.6 oz)  06/27/14 70.761 kg (156 lb)  06/16/14 69.854 kg (154 lb)     Intake/Output Summary (Last 24 hours) at 06/29/14 1115 Last data filed at 06/29/14 1102  Gross per 24 hour  Intake    266 ml  Output   1201 ml  Net   -935 ml     Physical Exam  Awake Alert, Oriented X 3, No new F.N deficits, Normal affect [place].AT,PERRAL Supple Neck,No JVD, No cervical lymphadenopathy appriciated.  Symmetrical Chest wall movement, reduced by basilar breath sounds iRRR,No Gallops,Rubs or new Murmurs, No Parasternal Heave +ve B.Sounds, Abd Soft, No tenderness, No organomegaly appriciated, No rebound - guarding or rigidity. No Cyanosis, Clubbing or edema, No new Rash or bruise      Data Review   Micro Results No results found for this or any previous visit (from the past 240 hour(s)).  Radiology Reports Dg Chest 2 View  06/27/2014   CLINICAL  DATA:  Right shoulder pain for 2 days.  EXAM: CHEST  2 VIEW  COMPARISON:  CT chest 04/25/2014. Single view of the chest 05/08/2014.  FINDINGS: Lung volumes are lower than on the comparison plain film of the chest with crowding of the bronchovascular structures. Heart size is enlarged but there is no pulmonary edema. No pneumothorax or pleural effusion.  IMPRESSION: Cardiomegaly without acute disease.   Electronically Signed   By: Inge Rise M.D.   On: 06/27/2014 19:19    CBC  Recent Labs Lab 06/27/14 1721 06/28/14 0417  WBC 6.3 7.7  HGB 9.7* 8.9*  HCT 31.2* 28.4*  PLT 167 147*  MCV 86.7 86.9  MCH 26.9 27.2  MCHC 31.1 31.3  RDW 15.4 15.8*    Chemistries   Recent Labs Lab 06/27/14 1721 06/28/14 0417 06/29/14 0515  NA 140 138 136*  K 4.1 4.3 4.1  CL 101 101 100  CO2 26 25 27   GLUCOSE 118* 165* 115*  BUN 28* 27* 26*  CREATININE 1.54* 1.32* 1.28*  CALCIUM 9.7 9.4 9.3   ------------------------------------------------------------------------------------------------------------------ estimated creatinine clearance is 25 ml/min (by C-G formula based on Cr of 1.28). ------------------------------------------------------------------------------------------------------------------ No results found for this basename: HGBA1C,  in the last 72 hours ------------------------------------------------------------------------------------------------------------------ No results found for this basename: CHOL, HDL, LDLCALC, TRIG, CHOLHDL, LDLDIRECT,  in the last 72 hours ------------------------------------------------------------------------------------------------------------------  Recent Labs  06/28/14 1030  TSH 2.560   ------------------------------------------------------------------------------------------------------------------  Recent Labs  06/27/14 2256  VITAMINB12 386  FOLATE >20.0  FERRITIN 23  TIBC 431  IRON 13*  RETICCTPCT 1.9    Coagulation profile No  results found for this basename: INR, PROTIME,  in the last 168 hours  No results found for this basename: DDIMER,  in the last 72 hours  Cardiac Enzymes  Recent Labs Lab 06/27/14 2256 06/28/14 0417 06/28/14 1030  TROPONINI <0.30 <0.30 <0.30   ------------------------------------------------------------------------------------------------------------------ No components found with this basename: POCBNP,      Time Spent in minutes   35   Temara Lanum K M.D on 06/29/2014 at 11:15 AM  Between 7am to 7pm - Pager - [phone]  After 7pm go to [email or web address] - password [email or web address]  And look for the night coverage person covering for me after hours  [facility] Office  [phone]   **Disclaimer: This note may have been dictated with voice recognition software. Similar sounding words can inadvertently be transcribed and this note may contain transcription errors which may not have been corrected upon publication of note.**

## 2014-06-29 NOTE — Progress Notes (Signed)
Subjective:  Shortness of breath has improved, no chest pain. She did have a couple somatic complaints.  Objective:  Vital Signs in the last 24 hours: Temp:  [98.5 F (36.9 C)-98.9 F (37.2 C)] 98.9 F (37.2 C) (08/16 0400) Pulse Rate:  [82-121] 82 (08/16 0400) Resp:  [15-17] 15 (08/16 0400) BP: (102-117)/(44-63) 116/50 mmHg (08/16 0400) SpO2:  [92 %-97 %] 97 % (08/16 0400) Weight:  [154 lb 1.6 oz (69.899 kg)] 154 lb 1.6 oz (69.899 kg) (08/16 0631)  Intake/Output from previous day: 08/15 0701 - 08/16 0700 In: 263 [P.O.:260; I.V.:3] Out: 700 [Urine:700]   Physical Exam: General: Elderly in no acute distress. Head:  Normocephalic and atraumatic. Lungs: Clear to auscultation and percussion. Heart: Normal S1 and S2 with occasional ectopy.  No murmur, rubs or gallops.  Abdomen: soft, non-tender, positive bowel sounds. Extremities: No clubbing or cyanosis. No edema. Neurologic: Alert and oriented x 3.    Lab Results:  Recent Labs  06/27/14 1721 06/28/14 0417  WBC 6.3 7.7  HGB 9.7* 8.9*  PLT 167 147*    Recent Labs  06/28/14 0417 06/29/14 0515  NA 138 136*  K 4.3 4.1  CL 101 100  CO2 25 27  GLUCOSE 165* 115*  BUN 27* 26*  CREATININE 1.32* 1.28*    Recent Labs  06/28/14 0417 06/28/14 1030  TROPONINI <0.30 <0.30   Imaging: Chest x-ray showed cardiomegaly Personally viewed.   Telemetry: Atrial fibrillation, fairly well rate controlled, PVCs Personally viewed.   EKG:  A. fib with frequent PVCs on admission  Cardiac Studies:   Studies:  - LHC (3/15): Mid LM 20-30%, mid RCA 30-40%, EF 65%  - Echo (4/15): Mild LVH, EF 60-65%, no RWMA, mild BAE, trivial eff  - Carotid US (5/15): 40-59% RICA stenosis. 1-44% LICA stenosis.  Scheduled Meds: . amiodarone  200 mg Oral BID  . apixaban  2.5 mg Oral BID  . aspirin EC  81 mg Oral Daily  . docusate sodium  200 mg Oral BID  . furosemide  20 mg Oral Daily  . levothyroxine  75 mcg Oral QAC breakfast  .  metoprolol tartrate  12.5 mg Oral BID  . polyethylene glycol  17 g Oral BID  . simvastatin  10 mg Oral QHS  . sodium chloride  3 mL Intravenous Q12H   Continuous Infusions:  PRN Meds:.sodium chloride, acetaminophen, gi cocktail, HYDROcodone-acetaminophen, morphine injection, ondansetron (ZOFRAN) IV, sodium chloride   Assessment/Plan:   80 year old with now persistent atrial fibrillation, chronic anticoagulation, acute diastolic heart failure, anemia, hyperlipidemia, pacemaker.  1. Paroxysmal atrial fibrillation/now persistent-restart of amiodarone on 06/28/14 to improve rate control as well as suppression of PVCs which may be contributing to her diastolic heart failure. She did not maintain sinus rhythm very long despite amiodarone and cardioversion a few months ago, however traditional rate controlling medications such as diltiazem are limited because of hypotension. Amiodarone currently 200 twice a day. In 2 weeks weeks decreased to 200 once a day.  2. Acute diastolic heart failure-likely multifactorial. Improved. No dyspnea. I will change her to Lasix 20 mg twice a day. By mouth.  3. Frequent PVCs-suppressed with amiodarone. This will improve her diastolic heart failure. Continue to monitor as outpatient, TSH, liver functions.  4. Nonobstructive CAD-recent heart catheterization 3/15-. Reassuring.   5. Chronic anticoagulation-continue with Eliquis. Watch for signs of worsening anemia. I will stop aspirin given her Eliquis use.  6. Hyperlipidemia-continue with low dose simvastatin.  7. Chest pain-atypical, likely musculoskeletal.  Reassuring troponin.  8. Pacemaker-functioning well.  Tomorrow, reasonable for discharge. We will sign off. Discussed with Dr.Singh  She will followup with Dr. Rachel Bo, [place] 06/29/2014, 8:06 AM

## 2014-06-30 ENCOUNTER — Ambulatory Visit: Payer: Medicare Other | Admitting: Family Medicine

## 2014-06-30 DIAGNOSIS — Z0289 Encounter for other administrative examinations: Secondary | ICD-10-CM

## 2014-06-30 LAB — BASIC METABOLIC PANEL
ANION GAP: 8 (ref 5–15)
BUN: 22 mg/dL (ref 6–23)
CHLORIDE: 98 meq/L (ref 96–112)
CO2: 27 mEq/L (ref 19–32)
Calcium: 9.3 mg/dL (ref 8.4–10.5)
Creatinine, Ser: 1.23 mg/dL — ABNORMAL HIGH (ref 0.50–1.10)
GFR, EST AFRICAN AMERICAN: 44 mL/min — AB (ref >90)
GFR, EST NON AFRICAN AMERICAN: 38 mL/min — AB (ref >90)
Glucose, Bld: 115 mg/dL — ABNORMAL HIGH (ref 70–99)
Potassium: 4.2 mEq/L (ref 3.7–5.3)
Sodium: 133 mEq/L — ABNORMAL LOW (ref 137–147)

## 2014-06-30 MED ORDER — DICYCLOMINE HCL 20 MG PO TABS
20.0000 mg | ORAL_TABLET | Freq: Three times a day (TID) | ORAL | Status: DC
  Administered 2014-06-30: 20 mg via ORAL
  Filled 2014-06-30 (×3): qty 1

## 2014-06-30 MED ORDER — FERROUS SULFATE 325 (65 FE) MG PO TABS: 325.0000 mg | ORAL_TABLET | Freq: Two times a day (BID) | ORAL | Status: DC

## 2014-06-30 MED ORDER — SIMETHICONE 80 MG PO CHEW
40.0000 mg | CHEWABLE_TABLET | Freq: Once | ORAL | Status: AC
  Administered 2014-06-30: 40 mg via ORAL
  Filled 2014-06-30: qty 1

## 2014-06-30 MED ORDER — AMIODARONE HCL 200 MG PO TABS: 200.0000 mg | ORAL_TABLET | Freq: Two times a day (BID) | ORAL | Status: DC

## 2014-06-30 MED ORDER — SIMETHICONE 40 MG/0.6ML PO SUSP
40.0000 mg | Freq: Once | ORAL | Status: DC
  Filled 2014-06-30: qty 0.6

## 2014-06-30 NOTE — Care Management Note (Unsigned)
    Page 1 of 1   06/30/2014     11:56:23 AM CARE MANAGEMENT NOTE 06/30/2014  Patient:  [name], [name]   Account Number:  [id number]  Date Initiated:  06/30/2014  Documentation initiated by:  GRAVES-BIGELOW,Tyquan Carmickle  Subjective/Objective Assessment:   Pt admitted for chest pain, afib RVR. Plan for d/c home today.     Action/Plan:   CM did speak to pt in ref to [facility] services. Pt states she is active with [place]. CM did call [facility] to clarify services. Awaiting call back from [facility].   Anticipated DC Date:  06/30/2014   Anticipated DC Plan:  [place]  CM consult      [facility] Choice  Resumption Of Svcs/PTA Provider   Choice offered to / List presented to:  C-1 Patient        [facility] arranged  HH-1 RN  [facility]      [place] [facility]  [facility]   Status of service:  Completed, signed off Medicare Important Message given?  YES (If response is "NO", the following Medicare IM given date fields will be blank) Date Medicare IM given:  06/30/2014 Medicare IM given by:  GRAVES-BIGELOW,Garielle Mroz Date Additional Medicare IM given:   Additional Medicare IM given by:    Discharge Disposition:  [place]  Per UR Regulation:  Reviewed for med. necessity/level of care/duration of stay  If discussed at [place] of Stay Meetings, dates discussed:    Comments:

## 2014-06-30 NOTE — Discharge Summary (Signed)
[name], is a 80 y.o. female  DOB 1925/01/17  MRN [id number].  Admission date:  06/27/2014  Admitting Physician  Waldemar Dickens, MD  Discharge Date:  06/30/2014   Primary MD  [name], MD  Recommendations for primary care physician for things to follow:   Repeat CBC, BMP in one week, monitor weight and diuretic dose closely  Admission Diagnosis  Other chest pain [786.59]   Discharge Diagnosis  Other chest pain [786.59]    Active Problems:   Shortness of breath   PVC's (premature ventricular contractions)   Hypothyroidism   Atrial fibrillation   Hypertension   Acute on chronic diastolic CHF (congestive heart failure)   Chronic anticoagulation   Chronic renal disease, stage III   Pacemaker   Chest pain      Past Medical History  Diagnosis Date  . Dyslipidemia   . Hypertension   . Palpitations     PVCs/bigeminy on event in May 2009 revealing this was relatively asymptomatic  . Chest pain     a. 2011 Neg MV;  b. 01/2014 Cath: LM 20-30, LAD nl, D1 nl, LCX nl, OM1 nl, RCA dom 30-77m, PD/PL nl, EF 65%->Med Rx.  . Degenerative joint disease   . Hx of varicella   . History of skin cancer     tafeen/ non melanoma.   . Thrombocytopenia   . Anemia   . Monoclonal gammopathies   . Right lower quadrant abdominal pain 07/19/2012    Recurrent  With nausea    This is new since she had ct for llq pain in [facility]    R/o appendiceal problem  Hernia  Get ct scan  And plan  Fu     . Diastolic CHF, acute on chronic 01/31/2012  . Atrial fibrillation     a. Dx 01/2014->Eliquis started.    Past Surgical History  Procedure Laterality Date  . Cholecystectomy  1999  . Cesarean section      times 2  . Shoulder surgery  1996    Right   . Cataract extraction      Bilateral  implantt  . Cardioversion N/A 02/26/2014   Procedure: CARDIOVERSION AT BEDSIDE;  Surgeon: Pixie Casino, MD;  Location: [place];  Service: Cardiovascular;  Laterality: N/A;  . Cardioversion N/A 04/22/2014    Procedure: CARDIOVERSION (BEDSIDE);  Surgeon: [name], MD;  Location: [facility] OR;  Service: Cardiovascular;  Laterality: N/A;  . Pacemaker insertion  04/23/2014    STJ Assurity dual chamber pacemaker implanted by Dr Rayann Heman       History of present illness and  Hospital Course:     Kindly see H&P for history of present illness and admission details, please review complete Labs, Consult reports and Test reports for all details in brief  HPI  from the history and physical done on the day of admission   [name] is a 80 y.o. female came to [facility] ed 06/27/2014 with  CP and SOB. Started at 14:30. Took 2  nitro w/o relief. Denies diaphoresis, palpitations, syncope. Noted 4lb wt gain in last couple of days. Pt states that her wt typically doesn't fluctuate. Takes lasix 20mg  twice a day. Did not take extra today. No home O2, but noted that Os sat at home was 90%. Now sleeping on 2 pillow at night instead of 1. Denies dark or melanic stools.    Hospital Course    1. Acute on chronic diastolic CHF EF 16% on recent echo. Continue Lasix orally, low-dose beta blocker along with salt and fluid restriction, now at baseline oxygen requirement or shortness of breath, continue oral diuretic at home. We'll request PCP to monitor BMP weight and diuretic dose closely.   2. A. fib with RVR. Cardiology following, amiodarone added as blood pressures were low to low dose beta blocker with good effect and she is in good rate control now, continue Eliquis. Outpatient cardiology followup.   3. Musculoskeletal chest pain. Due to shortness of breath and mild cough, repeat 2 view chest x-ray, troponin negative x2. Resolved with supportive care.    4. Chronic Kidney disease stage III. Baseline creatinine around 1.3-1.5. At this line. Monitor.     5. Dyslipidemia continue home dose statin.    6. Hypothyroidism. On home dose Synthroid   Lab Results   Component  Value  Date    TSH  2.560  06/28/2014      7. Mild constipation. Resolved with bowel regimen.     8. Iron deficiency anemia. Placed on oral iron supplementation outpatient workup by PCP.         Discharge Condition: stable   Follow UP  Follow-up Information   Follow up with [name], MD. Schedule an appointment as soon as possible for a visit in 3 days.   Specialties:  Internal Medicine, Pediatrics   Contact information:   [place] [address] [phone]       Follow up with Candee Furbish, MD. Schedule an appointment as soon as possible for a visit in 1 week.   Specialty:  Cardiology   Contact information:   [address] [place] [address] [phone]         Discharge Instructions  and  Discharge Medications      Discharge Instructions   Discharge instructions    Complete by:  As directed   Follow with Primary MD [name], MD in 3 days   Get CBC, CMP, 2 view Chest X ray checked  by Primary MD next visit.    Activity: As tolerated with Full fall precautions use walker/cane & assistance as needed   Disposition Home    Diet: Heart Healthy , Check your Weight same time everyday, if you gain over 2 pounds, or you develop in leg swelling, experience more shortness of breath or chest pain, call your Primary MD immediately. Follow Cardiac Low Salt Diet and 1.5 lit/day fluid restriction.   On your next visit with her primary care physician please Get Medicines reviewed and adjusted.  Please request your Prim.MD to go over all Hospital Tests and Procedure/Radiological results at the follow up, please get all Hospital records sent to your Prim MD by signing hospital release before you go home.   If you experience worsening of your admission symptoms, develop shortness of  breath, life threatening emergency, suicidal or homicidal thoughts you must seek medical attention immediately by calling 911 or calling your MD immediately  if symptoms less severe.  You Must read  complete instructions/literature along with all the possible adverse reactions/side effects for all the Medicines you take and that have been prescribed to you. Take any new Medicines after you have completely understood and accpet all the possible adverse reactions/side effects.   Do not drive, operating heavy machinery, perform activities at heights, swimming or participation in water activities or provide baby sitting services if your were admitted for syncope or siezures until you have seen by Primary MD or a Neurologist and advised to do so again.  Do not drive when taking Pain medications.    Do not take more than prescribed Pain, Sleep and Anxiety Medications  Special Instructions: If you have smoked or chewed Tobacco  in the last 2 yrs please stop smoking, stop any regular Alcohol  and or any Recreational drug use.  Wear Seat belts while driving.   Please note  You were cared for by a hospitalist during your hospital stay. If you have any questions about your discharge medications or the care you received while you were in the hospital after you are discharged, you can call the unit and asked to speak with the hospitalist on call if the hospitalist that took care of you is not available. Once you are discharged, your primary care physician will handle any further medical issues. Please note that NO REFILLS for any discharge medications will be authorized once you are discharged, as it is imperative that you return to your primary care physician (or establish a relationship with a primary care physician if you do not have one) for your aftercare needs so that they can reassess your need for medications and monitor your lab values.     Increase activity slowly    Complete by:  As directed              Medication List         amiodarone 200 MG tablet  Commonly known as:  PACERONE  Take 1 tablet (200 mg total) by mouth 2 (two) times daily. BID for 2 weeks then 1 daily     apixaban 2.5 MG Tabs tablet  Commonly known as:  ELIQUIS  Take 1 tablet (2.5 mg total) by mouth 2 (two) times daily.     aspirin 81 MG EC tablet  Take 1 tablet (81 mg total) by mouth daily.     CALCIUM PO  Take 600 mg by mouth daily.     ferrous sulfate 325 (65 FE) MG tablet  Take 1 tablet (325 mg total) by mouth 2 (two) times daily with a meal.     furosemide 20 MG tablet  Commonly known as:  LASIX  Take 1 tablet (20 mg total) by mouth 2 (two) times daily.     levothyroxine 75 MCG tablet  Commonly known as:  SYNTHROID, LEVOTHROID  Take 1 tablet (75 mcg total) by mouth daily.     loratadine 10 MG tablet  Commonly known as:  CLARITIN  Take 10 mg by mouth daily as needed for allergies.     metoprolol tartrate 25 MG tablet  Commonly known as:  LOPRESSOR  Take 0.5 tablets (12.5 mg total) by mouth 2 (two) times daily.     nitroGLYCERIN 0.4 MG SL tablet  Commonly known as:  NITROSTAT  Place 1 tablet (0.4 mg total) under the tongue every 5 (five) minutes as needed. For chest pain     simvastatin 10 MG tablet  Commonly known as:  ZOCOR  Take 1 tablet (10 mg total)  by mouth at bedtime.          Diet and Activity recommendation: See Discharge Instructions above   Consults obtained - Cards   Major procedures and Radiology Reports - PLEASE review detailed and final reports for all details, in brief -       Dg Chest 2 View  06/28/2014   CLINICAL DATA:  Short of breath.  Chest pressure.  EXAM: CHEST  2 VIEW  COMPARISON:  /14/15  FINDINGS: Moderate enlargement cardiac silhouette. No mediastinal or hilar masses. No lung consolidation or edema. No pleural effusion or pneumothorax.  Right anterior chest wall sequential pacemaker is stable in well positioned.  Bony thorax is diffusely  demineralized.  IMPRESSION: No acute findings.  Stable appearance from the previous day's study.   Electronically Signed   By: Lajean Manes M.D.   On: 06/28/2014 11:32   Dg Chest 2 View  06/27/2014   CLINICAL DATA:  Right shoulder pain for 2 days.  EXAM: CHEST  2 VIEW  COMPARISON:  CT chest 04/25/2014. Single view of the chest 05/08/2014.  FINDINGS: Lung volumes are lower than on the comparison plain film of the chest with crowding of the bronchovascular structures. Heart size is enlarged but there is no pulmonary edema. No pneumothorax or pleural effusion.  IMPRESSION: Cardiomegaly without acute disease.   Electronically Signed   By: Inge Rise M.D.   On: 06/27/2014 19:19    Micro Results     No results found for this or any previous visit (from the past 240 hour(s)).     Today   Subjective:   [name] today has no headache,no chest abdominal pain,no new weakness tingling or numbness, feels much better wants to go home today.    Objective:   Blood pressure 132/44, pulse 89, temperature 97.9 F (36.6 C), temperature source Oral, resp. rate 16, height 4\' 9"  (1.448 m), weight 69.899 kg (154 lb 1.6 oz), SpO2 94.00%.   Intake/Output Summary (Last 24 hours) at 06/30/14 0758 Last data filed at 06/29/14 2149  Gross per 24 hour  Intake    246 ml  Output    851 ml  Net   -605 ml    Exam Awake Alert, Oriented x 3, No new F.N deficits, Normal affect [place].AT,PERRAL Supple Neck,No JVD, No cervical lymphadenopathy appriciated.  Symmetrical Chest wall movement, Good air movement bilaterally, CTAB RRR,No Gallops,Rubs or new Murmurs, No Parasternal Heave +ve B.Sounds, Abd Soft, Non tender, No organomegaly appriciated, No rebound -guarding or rigidity. No Cyanosis, Clubbing or edema, No new Rash or bruise  Data Review   CBC w Diff: Lab Results  Component Value Date   WBC 7.7 06/28/2014   WBC 5.2 07/30/2013   HGB 8.9* 06/28/2014   HGB 12.5 07/30/2013   HCT 28.4* 06/28/2014    HCT 37.5 07/30/2013   PLT 147* 06/28/2014   PLT 153 07/30/2013   LYMPHOPCT 16 05/08/2014   LYMPHOPCT 27.7 07/30/2013   BANDSPCT PENDING 11/01/2013   MONOPCT 9 05/08/2014   MONOPCT 7.8 07/30/2013   EOSPCT 5 05/08/2014   EOSPCT 2.9 07/30/2013   BASOPCT 1 05/08/2014   BASOPCT 1.4 07/30/2013    CMP: Lab Results  Component Value Date   NA 133* 06/30/2014   NA 142 07/30/2013   K 4.2 06/30/2014   K 4.3 07/30/2013   CL 98 06/30/2014   CL 107 01/11/2013   CO2 27 06/30/2014   CO2 28 07/30/2013   BUN 22 06/30/2014   BUN 21.4  07/30/2013   CREATININE 1.23* 06/30/2014   CREATININE 1.0 07/30/2013   PROT 6.2 04/14/2014   PROT 6.6 07/30/2013   ALBUMIN 2.9* 04/14/2014   ALBUMIN 3.3* 07/30/2013   BILITOT 1.0 04/14/2014   BILITOT 0.58 07/30/2013   ALKPHOS 75 04/14/2014   ALKPHOS 89 07/30/2013   AST 12 04/14/2014   AST 15 07/30/2013   ALT 8 04/14/2014   ALT 12 07/30/2013  .   Total Time in preparing paper work, data evaluation and todays exam - 35 minutes  Thurnell Lose M.D on 06/30/2014 at 7:58 AM  [facility] Office  [phone]   **Disclaimer: This note may have been dictated with voice recognition software. Similar sounding words can inadvertently be transcribed and this note may contain transcription errors which may not have been corrected upon publication of note.**

## 2014-06-30 NOTE — Discharge Instructions (Signed)
Follow with Primary MD [name], MD in 3 days   Get CBC, CMP, 2 view Chest X ray checked  by Primary MD next visit.    Activity: As tolerated with Full fall precautions use walker/cane & assistance as needed   Disposition Home    Diet: Heart Healthy , Check your Weight same time everyday, if you gain over 2 pounds, or you develop in leg swelling, experience more shortness of breath or chest pain, call your Primary MD immediately. Follow Cardiac Low Salt Diet and 1.5 lit/day fluid restriction.   On your next visit with her primary care physician please Get Medicines reviewed and adjusted.  Please request your Prim.MD to go over all Hospital Tests and Procedure/Radiological results at the follow up, please get all Hospital records sent to your Prim MD by signing hospital release before you go home.   If you experience worsening of your admission symptoms, develop shortness of breath, life threatening emergency, suicidal or homicidal thoughts you must seek medical attention immediately by calling 911 or calling your MD immediately  if symptoms less severe.  You Must read complete instructions/literature along with all the possible adverse reactions/side effects for all the Medicines you take and that have been prescribed to you. Take any new Medicines after you have completely understood and accpet all the possible adverse reactions/side effects.   Do not drive, operating heavy machinery, perform activities at heights, swimming or participation in water activities or provide baby sitting services if your were admitted for syncope or siezures until you have seen by Primary MD or a Neurologist and advised to do so again.  Do not drive when taking Pain medications.    Do not take more than prescribed Pain, Sleep and Anxiety Medications  Special Instructions: If you have smoked or chewed Tobacco  in the last 2 yrs please stop smoking, stop any regular Alcohol  and or any Recreational  drug use.  Wear Seat belts while driving.   Please note  You were cared for by a hospitalist during your hospital stay. If you have any questions about your discharge medications or the care you received while you were in the hospital after you are discharged, you can call the unit and asked to speak with the hospitalist on call if the hospitalist that took care of you is not available. Once you are discharged, your primary care physician will handle any further medical issues. Please note that NO REFILLS for any discharge medications will be authorized once you are discharged, as it is imperative that you return to your primary care physician (or establish a relationship with a primary care physician if you do not have one) for your aftercare needs so that they can reassess your need for medications and monitor your lab values.   Cardiac Diet This diet can help prevent heart disease and stroke. Many factors influence your heart health, including eating and exercise habits. Coronary risk rises a lot with abnormal blood fat (lipid) levels. Cardiac meal planning includes limiting unhealthy fats, increasing healthy fats, and making other small dietary changes. General guidelines are as follows:  Adjust calorie intake to reach and maintain desirable body weight.  Limit total fat intake to less than 30% of total calories. Saturated fat should be less than 7% of calories.  Saturated fats are found in animal products and in some vegetable products. Saturated vegetable fats are found in coconut oil, cocoa butter, palm oil, and palm kernel oil. Read labels carefully to avoid  these products as much as possible. Use butter in moderation. Choose tub margarines and oils that have 2 grams of fat or less. Good cooking oils are canola and olive oils.  Practice low-fat cooking techniques. Do not fry food. Instead, broil, bake, boil, steam, grill, roast on a rack, stir-fry, or microwave it. Other fat reducing  suggestions include:  Remove the skin from poultry.  Remove all visible fat from meats.  Skim the fat off stews, soups, and gravies before serving them.  Steam vegetables in water or broth instead of sauting them in fat.  Avoid foods with trans fat (or hydrogenated oils), such as commercially fried foods and commercially baked goods. Commercial shortening and deep-frying fats will contain trans fat.  Increase intake of fruits, vegetables, whole grains, and legumes to replace foods high in fat.  Increase consumption of nuts, legumes, and seeds to at least 4 servings weekly. One serving of a legume equals  cup, and 1 serving of nuts or seeds equals  cup.  Choose whole grains more often. Have 3 servings per day (a serving is 1 ounce [oz]).  Eat 4 to 5 servings of vegetables per day. A serving of vegetables is 1 cup of raw leafy vegetables;  cup of raw or cooked cut-up vegetables;  cup of vegetable juice.  Eat 4 to 5 servings of fruit per day. A serving of fruit is 1 medium whole fruit;  cup of dried fruit;  cup of fresh, frozen, or canned fruit;  cup of 100% fruit juice.  Increase your intake of dietary fiber to 20 to 30 grams per day. Insoluble fiber may help lower your risk of heart disease and may help curb your appetite.  Soluble fiber binds cholesterol to be removed from the blood. Foods high in soluble fiber are dried beans, citrus fruits, oats, apples, bananas, broccoli, Brussels sprouts, and eggplant.  Try to include foods fortified with plant sterols or stanols, such as yogurt, breads, juices, or margarines. Choose several fortified foods to achieve a daily intake of 2 to 3 grams of plant sterols or stanols.  Foods with omega-3 fats can help reduce your risk of heart disease. Aim to have a 3.5 oz portion of fatty fish twice per week, such as salmon, mackerel, albacore tuna, sardines, lake trout, or herring. If you wish to take a fish oil supplement, choose one that contains 1  gram of both DHA and EPA.  Limit processed meats to 2 servings (3 oz portion) weekly.  Limit the sodium in your diet to 1500 milligrams (mg) per day. If you have high blood pressure, talk to a registered dietitian about a DASH (Dietary Approaches to Stop Hypertension) eating plan.  Limit sweets and beverages with added sugar, such as soda, to no more than 5 servings per week. One serving is:   1 tablespoon sugar.  1 tablespoon jelly or jam.   cup sorbet.  1 cup lemonade.   cup regular soda. CHOOSING FOODS Starches  Allowed: Breads: All kinds (wheat, rye, raisin, white, oatmeal, [place], [place], and English muffin bread). Low-fat rolls: English muffins, frankfurter and hamburger buns, bagels, pita bread, tortillas (not fried). Pancakes, waffles, biscuits, and muffins made with recommended oil.  Avoid: Products made with saturated or trans fats, oils, or whole milk products. Butter rolls, cheese breads, croissants. Commercial doughnuts, muffins, sweet rolls, biscuits, waffles, pancakes, store-bought mixes. Crackers  Allowed: Low-fat crackers and snacks: Animal, graham, rye, saltine (with recommended oil, no lard), oyster, and matzo crackers. Bread sticks,  melba toast, rusks, flatbread, pretzels, and light popcorn.  Avoid: High-fat crackers: cheese crackers, butter crackers, and those made with coconut, palm oil, or trans fat (hydrogenated oils). Buttered popcorn. Cereals  Allowed: Hot or cold whole-grain cereals.  Avoid: Cereals containing coconut, hydrogenated vegetable fat, or animal fat. Potatoes / Pasta / Rice  Allowed: All kinds of potatoes, rice, and pasta (such as macaroni, spaghetti, and noodles).  Avoid: Pasta or rice prepared with cream sauce or high-fat cheese. Chow mein noodles, [place] fries. Vegetables  Allowed: All vegetables and vegetable juices.  Avoid: Fried vegetables. Vegetables in cream, butter, or high-fat cheese sauces. Limit coconut. Fruit in cream or  custard. Protein  Allowed: Limit your intake of meat, seafood, and poultry to no more than 6 oz (cooked weight) per day. All lean, well-trimmed beef, veal, pork, and lamb. All chicken and [place] without skin. All fish and shellfish. Wild game: wild duck, rabbit, pheasant, and venison. Egg whites or low-cholesterol egg substitutes may be used as desired. Meatless dishes: recipes with dried beans, peas, lentils, and tofu (soybean curd). Seeds and nuts: all seeds and most nuts.  Avoid: Prime grade and other heavily marbled and fatty meats, such as short ribs, spare ribs, rib eye roast or steak, frankfurters, sausage, bacon, and high-fat luncheon meats, mutton. Caviar. Commercially fried fish. Domestic duck, goose, venison sausage. Organ meats: liver, gizzard, heart, chitterlings, brains, kidney, sweetbreads. Dairy  Allowed: Low-fat cheeses: nonfat or low-fat cottage cheese (1% or 2% fat), cheeses made with part skim milk, such as mozzarella, farmers, string, or ricotta. (Cheeses should be labeled no more than 2 to 6 grams fat per oz.). Skim (or 1%) milk: liquid, powdered, or evaporated. Buttermilk made with low-fat milk. Drinks made with skim or low-fat milk or cocoa. Chocolate milk or cocoa made with skim or low-fat (1%) milk. Nonfat or low-fat yogurt.  Avoid: Whole milk cheeses, including colby, cheddar, mu[place], Monterey Jack, [place], [place], [place], American, Swiss, and blue. Creamed cottage cheese, cream cheese. Whole milk and whole milk products, including buttermilk or yogurt made from whole milk, drinks made from whole milk. Condensed milk, evaporated whole milk, and 2% milk. Soups and Combination Foods  Allowed: Low-fat low-sodium soups: broth, dehydrated soups, homemade broth, soups with the fat removed, homemade cream soups made with skim or low-fat milk. Low-fat spaghetti, lasagna, chili, and Spanish rice if low-fat ingredients and low-fat cooking techniques are used.  Avoid: Cream soups  made with whole milk, cream, or high-fat cheese. All other soups. Desserts and Sweets  Allowed: Sherbet, fruit ices, gelatins, meringues, and angel food cake. Homemade desserts with recommended fats, oils, and milk products. Jam, jelly, honey, marmalade, sugars, and syrups. Pure sugar candy, such as gum drops, hard candy, jelly beans, marshmallows, mints, and small amounts of dark chocolate.  Avoid: Commercially prepared cakes, pies, cookies, frosting, pudding, or mixes for these products. Desserts containing whole milk products, chocolate, coconut, lard, palm oil, or palm kernel oil. Ice cream or ice cream drinks. Candy that contains chocolate, coconut, butter, hydrogenated fat, or unknown ingredients. Buttered syrups. Fats and Oils  Allowed: Vegetable oils: safflower, sunflower, corn, soybean, cottonseed, sesame, canola, olive, or peanut. Non-hydrogenated margarines. Salad dressing or mayonnaise: homemade or commercial, made with a recommended oil. Low or nonfat salad dressing or mayonnaise.  Limit added fats and oils to 6 to 8 tsp per day (includes fats used in cooking, baking, salads, and spreads on bread). Remember to count the "hidden fats" in foods.  Avoid: Solid fats and shortenings:  butter, lard, salt pork, bacon drippings. Gravy containing meat fat, shortening, or suet. Cocoa butter, coconut. Coconut oil, palm oil, palm kernel oil, or hydrogenated oils: these ingredients are often used in bakery products, nondairy creamers, whipped toppings, candy, and commercially fried foods. Read labels carefully. Salad dressings made of unknown oils, sour cream, or cheese, such as blue cheese and Roquefort. Cream, all kinds: half-and-half, light, heavy, or whipping. Sour cream or cream cheese (even if "light" or low-fat). Nondairy cream substitutes: coffee creamers and sour cream substitutes made with palm, palm kernel, hydrogenated oils, or coconut oil. Beverages  Allowed: Coffee (regular or  decaffeinated), tea. Diet carbonated beverages, mineral water. Alcohol: Check with your caregiver. Moderation is recommended.  Avoid: Whole milk, regular sodas, and juice drinks with added sugar. Condiments  Allowed: All seasonings and condiments. Cocoa powder. "Cream" sauces made with recommended ingredients.  Avoid: Carob powder made with hydrogenated fats. SAMPLE MENU Breakfast   cup orange juice   cup oatmeal  1 slice toast  1 tsp margarine  1 cup skim milk Lunch  [place] sandwich with 2 oz [place], 2 slices bread  Lettuce and tomato slices  Fresh fruit  Carrot sticks  Coffee or tea Snack  Fresh fruit or low-fat crackers Dinner  3 oz lean ground beef  1 baked potato  1 tsp margarine   cup asparagus  Lettuce salad  1 tbs non-creamy dressing   cup peach slices  1 cup skim milk Document Released: 08/09/2008 Document Revised: 05/01/2012 Document Reviewed: 12/31/2013 Exit[employer] Patient Information 2015 [place], [place]. This information is not intended to replace advice given to you by your health care provider. Make sure you discuss any questions you have with your health care provider.

## 2014-06-30 NOTE — Progress Notes (Signed)
      Spoke to Ms. Mattioli. Seemed quit anxious about several issues. Reassurance. I discussed with her the fact that her recent heart cath was reassuring. Exam was unremarkable: RRR, no new murmurs. Lungs were clear.   I am comfortable from cardiology perspective with her discharge.   Candee Furbish, MD

## 2014-07-01 NOTE — Telephone Encounter (Signed)
Pt was notified.  

## 2014-07-02 ENCOUNTER — Telehealth: Payer: Self-pay | Admitting: Internal Medicine

## 2014-07-02 ENCOUNTER — Telehealth: Payer: Self-pay | Admitting: *Deleted

## 2014-07-02 NOTE — Telephone Encounter (Signed)
Pam triage nurse with [facility] transferred Elmyra Ricks, nurse with [facility] [phone])  to me at 1pm and Elmyra Ricks states she is at the pts home now and the pt complains of black tarry stools this am and she thought this was due to the iron supplement but the pt has not picked up the Rx for this since discharged from the hospital 2 days ago.  Elmyra Ricks states the pt is very anxious, BP 160/80-irregular pulse of 70, oxygen 93-95% and pt complains of shortness of breath even while walking to the restroom, no change in weight, lungs are clear.  Please advise. Message forwarded to Dr Regis Bill.

## 2014-07-02 NOTE — Telephone Encounter (Signed)
Patient needs to go to ER to get evaluated

## 2014-07-02 NOTE — Telephone Encounter (Signed)
Called and spoke with Elmyra Ricks from [place].  Advised that the pt go into the ED if she thinks there is blood in her stool.  Pt on ASA and Eliquis.  If she does not think it is blood and could be iron related than can be seen by [facility] on 07/03/14.  Advised the pt call me if any questions.  Elmyra Ricks will call the pt and relay all.  Will forward note to Dr. Harrington Challenger for any further instructions.

## 2014-07-02 NOTE — Telephone Encounter (Signed)
Spoke with Misty CMA at Dr Mertie Moores PCP's office . She spoke with Elmyra Ricks at [place]. Pt has been having black tarry stools this AM and having SOB when walking . Pt's saturation is 93 to 95 % HR 70 beats/minute irregular, BP 160/80. Pt was D/C from the hospital with iron pills 2 days ago, but she has not peak up  the medication from the pharmacy it this time. Pt is taking Eliquis and aspirin. Elmyra Ricks at [place] is concern, because pt is taking the anticoagulant medication.

## 2014-07-02 NOTE — Telephone Encounter (Signed)
Pt need a hospital fup. What day can I use to get her in for 30 minute visit

## 2014-07-02 NOTE — Telephone Encounter (Signed)
I spoke to the pt.  She is unwilling at this time to go to ER.  She stated she will "wait it out."  Has an appt with cardiology on 07/07/14 with Dr. Harrington Challenger for post hospital follow up.  Tried to contact Whitlock from [place] to see if she is able to do hemoccult testing.

## 2014-07-02 NOTE — Telephone Encounter (Signed)
Pt called back to fu on an appt for thurs. Advised pt that after [facility] asst speaks w/ her other drs, we will call pt with fu iinstructions.

## 2014-07-02 NOTE — Telephone Encounter (Signed)
Per nicole rn [facility] [phone] pt woke up this morning with black tarry stool.[name] was transferred to [facility]

## 2014-07-02 NOTE — Telephone Encounter (Signed)
Spoke with [name] with [facility], she  Will call pt to go   to the ER to be evaluated. [name] states she will let pt know.

## 2014-07-02 NOTE — Telephone Encounter (Signed)
Spoke to triage at cardiology.  Nurse took all information and will review with Dr. Harrington Challenger.  Will wait on a return phone call.

## 2014-07-03 ENCOUNTER — Ambulatory Visit (INDEPENDENT_AMBULATORY_CARE_PROVIDER_SITE_OTHER): Payer: Medicare Other | Admitting: Family Medicine

## 2014-07-03 ENCOUNTER — Encounter: Payer: Self-pay | Admitting: Family Medicine

## 2014-07-03 VITALS — BP 150/86 | HR 88 | Temp 98.0°F | Wt 157.0 lb

## 2014-07-03 DIAGNOSIS — R195 Other fecal abnormalities: Secondary | ICD-10-CM

## 2014-07-03 DIAGNOSIS — I5033 Acute on chronic diastolic (congestive) heart failure: Secondary | ICD-10-CM

## 2014-07-03 DIAGNOSIS — I509 Heart failure, unspecified: Secondary | ICD-10-CM

## 2014-07-03 DIAGNOSIS — R0602 Shortness of breath: Secondary | ICD-10-CM

## 2014-07-03 LAB — BASIC METABOLIC PANEL
BUN: 25 mg/dL — AB (ref 6–23)
CALCIUM: 9.5 mg/dL (ref 8.4–10.5)
CO2: 29 mEq/L (ref 19–32)
Chloride: 100 mEq/L (ref 96–112)
Creatinine, Ser: 1.5 mg/dL — ABNORMAL HIGH (ref 0.4–1.2)
GFR: 34.1 mL/min — AB (ref >60.00)
GLUCOSE: 93 mg/dL (ref 70–99)
POTASSIUM: 4.9 meq/L (ref 3.5–5.1)
Sodium: 135 mEq/L (ref 135–145)

## 2014-07-03 LAB — POCT HEMOGLOBIN: Hemoglobin: 8.9 g/dL — AB (ref 12.2–16.2)

## 2014-07-03 NOTE — Progress Notes (Signed)
Garret Reddish, MD Phone: [phone]  Subjective:   [name] is a 80 y.o. year old very pleasant female patient who presents with the following:  Shortness of breath Review records-Patient recently discharged from hospital after a three-day stay for chest pain rule out and diastolic congestive heart failure. Appears she received 20 mg IV Lasix twice a day during her stay. She was converted to 20 mg by mouth twice a day at time of discharge. She did have A. fib with RVR during hospitalization and she had amiodarone added. Her TSH was normal during her stay. She was continued on eliquis. She ruled out for ACS with negative troponins and pains thought to be musculoskeletal.  Patient states since being home from the hospital she has had intermittent shortness of breath typically for periods around 5 minutes every 2-3 hours. She can be resting when these occur. Do not seem to be worse with exertion. Denies chest pain or diaphoresis. She thinks she has worsening swelling in her legs. She noted one black tarry stool yesterday. She denies starting taking her iron until this morning. Her stool this morning was slightly dark. No prior red blood per rectum. she denies weight gain at home. She does feel fatigued when she has periods of shortness of breath but otherwise feels well. She states she has a pacemaker but this is not helped her atrial fibrillation. Patient reiterates her desire not go to the hospital today just as she refused yesterday. Patient states she is taking her medicines as prescribed but she cannot recall specific names and did not bring her medicines with her today. ROS-no fevers chills or worsening cough. See history of present illness. No palpitations  Past Medical History-diastolic CHF with EF 70%, pacemaker, CKD III, hx NSTEMI, HLD, HTN, CAD  Medications- reviewed and updated Current Outpatient Prescriptions  Medication Sig Dispense Refill  . amiodarone (PACERONE) 200 MG  tablet Take 1 tablet (200 mg total) by mouth 2 (two) times daily. BID for 2 weeks then 1 daily  60 tablet  0  . apixaban (ELIQUIS) 2.5 MG TABS tablet Take 1 tablet (2.5 mg total) by mouth 2 (two) times daily.      Marland Kitchen aspirin EC 81 MG EC tablet Take 1 tablet (81 mg total) by mouth daily.      Marland Kitchen CALCIUM PO Take 600 mg by mouth daily.       . ferrous sulfate 325 (65 FE) MG tablet Take 1 tablet (325 mg total) by mouth 2 (two) times daily with a meal.  60 tablet  1  . furosemide (LASIX) 20 MG tablet Take 1 tablet (20 mg total) by mouth 2 (two) times daily.  60 tablet  3  . levothyroxine (SYNTHROID, LEVOTHROID) 75 MCG tablet Take 1 tablet (75 mcg total) by mouth daily.  90 tablet  2  . loratadine (CLARITIN) 10 MG tablet Take 10 mg by mouth daily as needed for allergies.       . metoprolol tartrate (LOPRESSOR) 25 MG tablet Take 0.5 tablets (12.5 mg total) by mouth 2 (two) times daily.      . nitroGLYCERIN (NITROSTAT) 0.4 MG SL tablet Place 1 tablet (0.4 mg total) under the tongue every 5 (five) minutes as needed. For chest pain  25 tablet  6  . simvastatin (ZOCOR) 10 MG tablet Take 1 tablet (10 mg total) by mouth at bedtime.       No current facility-administered medications for this visit.    Objective: BP 150/86  Pulse 88  Temp(Src) 98 F (36.7 C)  Wt 157 lb (71.215 kg)  SpO2 97% Gen: NAD, resting comfortably in chair CV: irregularly irregular , normal rate, no JVD Lungs: CTAB no crackles, wheeze, rhonchi Ext: trace edema (actually less than previous exam before hospitalization) Skin: warm, dry, no rash  Results for orders placed in visit on 07/03/14 (from the past 24 hour(s))  POCT HEMOGLOBIN     Status: Abnormal   Collection Time    07/03/14  3:24 PM      Result Value Ref Range   Hemoglobin 8.9 (*) 12.2 - 16.2 g/dL   Assessment/Plan:  Shortness of breath My concern is that patient may be experiencing periods of A. fib with RVR when she becomes short of breath. She has checked her  pulse ox at home and her sats maybe 97% but her heart rate is often in the 120s or 130s when she feels short of breath. Her heart rate today is 88 on my exam and she was not short of breath. She had no labored breathing and her lungs are clear. Certainly think her anemia could be contributing with a hemoglobin of 8.9 but this is stable since hospital discharge and she was diagnosed with iron deficiency anemia. I do not think her hemoglobin of 8.9 would cause intermittent shortness of breath without exertion. I have called the cardiology office and had patient's appointment moved from Monday to this Friday. I have asked patient to followup next week with Dr. Regis Bill for hospital followup. Have given patient reason for seeing return to care. Check bmet for electrolytes on Lasix. Encouraged extra dose of Lasix tonight given weight 1 pound heavier from when I saw her before hospitalization but overall I do not think she is particularly fluid overloaded. Cardiology can reassess tomorrow.  Anemia Hemoccult cards sent home with patient. She preferred not to have rectal exam. Hemoglobin stable at 8.9. Encouraged patient to take iron. Patient was not interested in hospital visit. She agreed to potential colonoscopy if blood was noted in the stool.   Orders Placed This Encounter  Procedures  . Basic metabolic panel    [place]  . POC occult blood, ED    Standing Status: Future     Number of Occurrences:      Standing Expiration Date: 07/11/2014  . POCT hemoglobin

## 2014-07-03 NOTE — Assessment & Plan Note (Addendum)
Hemoccult cards sent home with patient. She preferred not to have rectal exam. Hemoglobin stable at 8.9. Encouraged patient to take iron. Patient was not interested in hospital visit. She agreed to potential colonoscopy if blood was noted in the stool.

## 2014-07-03 NOTE — Assessment & Plan Note (Signed)
My concern is that patient may be experiencing periods of A. fib with RVR when she becomes short of breath. She has checked her pulse ox at home and her sats maybe 97% but her heart rate is often in the 120s or 130s when she feels short of breath. Her heart rate today is 88 on my exam and she was not short of breath. She had no labored breathing and her lungs are clear. Certainly think her anemia could be contributing with a hemoglobin of 8.9 but this is stable since hospital discharge and she was diagnosed with iron deficiency anemia. I do not think her hemoglobin of 8.9 would cause intermittent shortness of breath without exertion. I have called the cardiology office and had patient's appointment moved from Monday to this Friday. I have asked patient to followup next week with Dr. Regis Bill for hospital followup. Have given patient reason for seeing return to care. Check bmet for electrolytes on Lasix. Encouraged extra dose of Lasix tonight given weight 1 pound heavier from when I saw her before hospitalization but overall I do not think she is particularly fluid overloaded. Cardiology can reassess tomorrow.

## 2014-07-03 NOTE — Patient Instructions (Addendum)
Check your hemoglobin (blood counts) and your stool for blood (lab will give you instructions). If you were to develop chest pain or worsening shortness of breath, please go to the emergency room.   Regarding shortness of breath  I wonder if this is related to your atrial fibrillation as seems to be related to when your HR is higher  I had them move up your appointment with cardiology to tomorrow  Take an extra 20 mg of lasix this evening  Check kidney function today   See Dr. Regis Bill very soon for hospital follow up, [name]

## 2014-07-03 NOTE — Telephone Encounter (Signed)
[facility] should be able to do CBC and BMET

## 2014-07-03 NOTE — Telephone Encounter (Signed)
Spoke with patient who has concerns about her medications she is taking since being discharged from hospital. Reviewed hospital med list with her. She has all meds and is taking as ordered from the hospital. I discussed that I would send a message to Dr. Harrington Challenger asking her to clarify if she wanted her to be on these medications but patient does have appointment tomorrow with Marcellina Millin, NP.

## 2014-07-03 NOTE — Telephone Encounter (Signed)
Hemoccult cards, CBC and BMET ordered through [place] to be done today. Thanks for your help Dr. Harrington Challenger

## 2014-07-04 ENCOUNTER — Encounter: Payer: Self-pay | Admitting: Nurse Practitioner

## 2014-07-04 ENCOUNTER — Encounter (HOSPITAL_COMMUNITY): Payer: Self-pay | Admitting: General Practice

## 2014-07-04 ENCOUNTER — Ambulatory Visit (INDEPENDENT_AMBULATORY_CARE_PROVIDER_SITE_OTHER): Payer: Medicare Other | Admitting: Nurse Practitioner

## 2014-07-04 ENCOUNTER — Other Ambulatory Visit: Payer: Self-pay | Admitting: Nurse Practitioner

## 2014-07-04 ENCOUNTER — Inpatient Hospital Stay (HOSPITAL_COMMUNITY)
Admission: AD | Admit: 2014-07-04 | Discharge: 2014-07-14 | DRG: 291 | Disposition: A | Discharge status: Home Health | Payer: Medicare Other | Source: Ambulatory Visit | Attending: Internal Medicine | Admitting: Internal Medicine

## 2014-07-04 ENCOUNTER — Telehealth: Payer: Self-pay | Admitting: Internal Medicine

## 2014-07-04 VITALS — BP 110/70 | HR 94 | Ht <= 58 in | Wt 156.1 lb

## 2014-07-04 DIAGNOSIS — K219 Gastro-esophageal reflux disease without esophagitis: Secondary | ICD-10-CM | POA: Diagnosis present

## 2014-07-04 DIAGNOSIS — D509 Iron deficiency anemia, unspecified: Secondary | ICD-10-CM | POA: Diagnosis present

## 2014-07-04 DIAGNOSIS — K449 Diaphragmatic hernia without obstruction or gangrene: Secondary | ICD-10-CM | POA: Diagnosis present

## 2014-07-04 DIAGNOSIS — I5023 Acute on chronic systolic (congestive) heart failure: Secondary | ICD-10-CM | POA: Diagnosis present

## 2014-07-04 DIAGNOSIS — Z85828 Personal history of other malignant neoplasm of skin: Secondary | ICD-10-CM

## 2014-07-04 DIAGNOSIS — R079 Chest pain, unspecified: Secondary | ICD-10-CM

## 2014-07-04 DIAGNOSIS — N183 Chronic kidney disease, stage 3 unspecified: Secondary | ICD-10-CM | POA: Diagnosis present

## 2014-07-04 DIAGNOSIS — N289 Disorder of kidney and ureter, unspecified: Secondary | ICD-10-CM

## 2014-07-04 DIAGNOSIS — Z79899 Other long term (current) drug therapy: Secondary | ICD-10-CM

## 2014-07-04 DIAGNOSIS — Z950 Presence of cardiac pacemaker: Secondary | ICD-10-CM

## 2014-07-04 DIAGNOSIS — I10 Essential (primary) hypertension: Secondary | ICD-10-CM

## 2014-07-04 DIAGNOSIS — J9600 Acute respiratory failure, unspecified whether with hypoxia or hypercapnia: Secondary | ICD-10-CM | POA: Diagnosis present

## 2014-07-04 DIAGNOSIS — D500 Iron deficiency anemia secondary to blood loss (chronic): Secondary | ICD-10-CM

## 2014-07-04 DIAGNOSIS — K921 Melena: Secondary | ICD-10-CM | POA: Diagnosis present

## 2014-07-04 DIAGNOSIS — I509 Heart failure, unspecified: Secondary | ICD-10-CM

## 2014-07-04 DIAGNOSIS — I4819 Other persistent atrial fibrillation: Secondary | ICD-10-CM

## 2014-07-04 DIAGNOSIS — J9601 Acute respiratory failure with hypoxia: Secondary | ICD-10-CM

## 2014-07-04 DIAGNOSIS — I2510 Atherosclerotic heart disease of native coronary artery without angina pectoris: Secondary | ICD-10-CM | POA: Diagnosis present

## 2014-07-04 DIAGNOSIS — Z888 Allergy status to other drugs, medicaments and biological substances status: Secondary | ICD-10-CM

## 2014-07-04 DIAGNOSIS — N179 Acute kidney failure, unspecified: Secondary | ICD-10-CM | POA: Diagnosis present

## 2014-07-04 DIAGNOSIS — E039 Hypothyroidism, unspecified: Secondary | ICD-10-CM | POA: Diagnosis present

## 2014-07-04 DIAGNOSIS — I5031 Acute diastolic (congestive) heart failure: Secondary | ICD-10-CM

## 2014-07-04 DIAGNOSIS — Z8249 Family history of ischemic heart disease and other diseases of the circulatory system: Secondary | ICD-10-CM

## 2014-07-04 DIAGNOSIS — Z801 Family history of malignant neoplasm of trachea, bronchus and lung: Secondary | ICD-10-CM

## 2014-07-04 DIAGNOSIS — M545 Low back pain, unspecified: Secondary | ICD-10-CM | POA: Diagnosis present

## 2014-07-04 DIAGNOSIS — R627 Adult failure to thrive: Secondary | ICD-10-CM | POA: Diagnosis present

## 2014-07-04 DIAGNOSIS — I4891 Unspecified atrial fibrillation: Secondary | ICD-10-CM | POA: Diagnosis present

## 2014-07-04 DIAGNOSIS — I5032 Chronic diastolic (congestive) heart failure: Secondary | ICD-10-CM

## 2014-07-04 DIAGNOSIS — G8929 Other chronic pain: Secondary | ICD-10-CM | POA: Diagnosis present

## 2014-07-04 DIAGNOSIS — E785 Hyperlipidemia, unspecified: Secondary | ICD-10-CM | POA: Diagnosis present

## 2014-07-04 DIAGNOSIS — Z7901 Long term (current) use of anticoagulants: Secondary | ICD-10-CM

## 2014-07-04 DIAGNOSIS — N189 Chronic kidney disease, unspecified: Secondary | ICD-10-CM

## 2014-07-04 DIAGNOSIS — R0602 Shortness of breath: Secondary | ICD-10-CM

## 2014-07-04 DIAGNOSIS — I129 Hypertensive chronic kidney disease with stage 1 through stage 4 chronic kidney disease, or unspecified chronic kidney disease: Secondary | ICD-10-CM | POA: Diagnosis present

## 2014-07-04 DIAGNOSIS — I480 Paroxysmal atrial fibrillation: Secondary | ICD-10-CM

## 2014-07-04 DIAGNOSIS — I5033 Acute on chronic diastolic (congestive) heart failure: Secondary | ICD-10-CM

## 2014-07-04 DIAGNOSIS — R4181 Age-related cognitive decline: Secondary | ICD-10-CM | POA: Diagnosis present

## 2014-07-04 DIAGNOSIS — I482 Chronic atrial fibrillation, unspecified: Secondary | ICD-10-CM

## 2014-07-04 DIAGNOSIS — Z820 Family history of epilepsy and other diseases of the nervous system: Secondary | ICD-10-CM

## 2014-07-04 DIAGNOSIS — K2960 Other gastritis without bleeding: Secondary | ICD-10-CM | POA: Diagnosis present

## 2014-07-04 DIAGNOSIS — K5900 Constipation, unspecified: Secondary | ICD-10-CM | POA: Diagnosis present

## 2014-07-04 HISTORY — DX: Malignant (primary) neoplasm, unspecified: C80.1

## 2014-07-04 HISTORY — DX: Presence of cardiac pacemaker: Z95.0

## 2014-07-04 HISTORY — DX: Cardiac arrhythmia, unspecified: I49.9

## 2014-07-04 LAB — PROTIME-INR
INR: 1.53 — ABNORMAL HIGH (ref 0.00–1.49)
Prothrombin Time: 18.4 seconds — ABNORMAL HIGH (ref 11.6–15.2)

## 2014-07-04 LAB — COMPREHENSIVE METABOLIC PANEL
ALT: 10 U/L (ref 0–35)
AST: 14 U/L (ref 0–37)
Albumin: 3.2 g/dL — ABNORMAL LOW (ref 3.5–5.2)
Alkaline Phosphatase: 99 U/L (ref 39–117)
Anion gap: 13 (ref 5–15)
BUN: 24 mg/dL — ABNORMAL HIGH (ref 6–23)
CO2: 28 mEq/L (ref 19–32)
Calcium: 9.9 mg/dL (ref 8.4–10.5)
Chloride: 100 mEq/L (ref 96–112)
Creatinine, Ser: 1.49 mg/dL — ABNORMAL HIGH (ref 0.50–1.10)
GFR calc Af Amer: 35 mL/min — ABNORMAL LOW (ref >90)
GFR calc non Af Amer: 30 mL/min — ABNORMAL LOW (ref >90)
Glucose, Bld: 117 mg/dL — ABNORMAL HIGH (ref 70–99)
Potassium: 4.5 mEq/L (ref 3.7–5.3)
Sodium: 141 mEq/L (ref 137–147)
Total Bilirubin: 0.6 mg/dL (ref 0.3–1.2)
Total Protein: 7 g/dL (ref 6.0–8.3)

## 2014-07-04 LAB — CBC WITH DIFFERENTIAL/PLATELET
Basophils Absolute: 0 10*3/uL (ref 0.0–0.1)
Basophils Relative: 1 % (ref 0–1)
Eosinophils Absolute: 0 10*3/uL (ref 0.0–0.7)
Eosinophils Relative: 1 % (ref 0–5)
HCT: 31.3 % — ABNORMAL LOW (ref 36.0–46.0)
Hemoglobin: 9.6 g/dL — ABNORMAL LOW (ref 12.0–15.0)
Lymphocytes Relative: 12 % (ref 12–46)
Lymphs Abs: 0.7 10*3/uL (ref 0.7–4.0)
MCH: 26.7 pg (ref 26.0–34.0)
MCHC: 30.7 g/dL (ref 30.0–36.0)
MCV: 87.2 fL (ref 78.0–100.0)
Monocytes Absolute: 0.6 10*3/uL (ref 0.1–1.0)
Monocytes Relative: 10 % (ref 3–12)
Neutro Abs: 4.3 10*3/uL (ref 1.7–7.7)
Neutrophils Relative %: 76 % (ref 43–77)
Platelets: 217 10*3/uL (ref 150–400)
RBC: 3.59 MIL/uL — ABNORMAL LOW (ref 3.87–5.11)
RDW: 15.7 % — ABNORMAL HIGH (ref 11.5–15.5)
WBC: 5.7 10*3/uL (ref 4.0–10.5)

## 2014-07-04 LAB — TROPONIN I
Troponin I: <0.3 ng/mL (ref <0.30)
Troponin I: <0.3 ng/mL (ref <0.30)

## 2014-07-04 LAB — PRO B NATRIURETIC PEPTIDE: Pro B Natriuretic peptide (BNP): 2180 pg/mL — ABNORMAL HIGH (ref 0–450)

## 2014-07-04 LAB — TSH: TSH: 2.76 u[IU]/mL (ref 0.350–4.500)

## 2014-07-04 LAB — APTT: aPTT: 42 seconds — ABNORMAL HIGH (ref 24–37)

## 2014-07-04 LAB — MAGNESIUM: Magnesium: 2.2 mg/dL (ref 1.5–2.5)

## 2014-07-04 MED ORDER — AMIODARONE HCL 200 MG PO TABS
200.0000 mg | ORAL_TABLET | Freq: Two times a day (BID) | ORAL | Status: DC
  Administered 2014-07-04 – 2014-07-14 (×21): 200 mg via ORAL
  Filled 2014-07-04 (×23): qty 1

## 2014-07-04 MED ORDER — SODIUM CHLORIDE 0.9 % IJ SOLN
3.0000 mL | INTRAMUSCULAR | Status: DC | PRN
  Administered 2014-07-07: 3 mL via INTRAVENOUS

## 2014-07-04 MED ORDER — LORATADINE 10 MG PO TABS
10.0000 mg | ORAL_TABLET | Freq: Every day | ORAL | Status: DC | PRN
  Administered 2014-07-04 – 2014-07-14 (×9): 10 mg via ORAL
  Filled 2014-07-04 (×9): qty 1

## 2014-07-04 MED ORDER — METOPROLOL TARTRATE 12.5 MG HALF TABLET
12.5000 mg | ORAL_TABLET | Freq: Two times a day (BID) | ORAL | Status: DC
  Administered 2014-07-04 – 2014-07-05 (×3): 12.5 mg via ORAL
  Filled 2014-07-04 (×5): qty 1

## 2014-07-04 MED ORDER — SIMVASTATIN 10 MG PO TABS
10.0000 mg | ORAL_TABLET | Freq: Every day | ORAL | Status: DC
  Administered 2014-07-04 – 2014-07-13 (×10): 10 mg via ORAL
  Filled 2014-07-04 (×12): qty 1

## 2014-07-04 MED ORDER — ONDANSETRON HCL 4 MG PO TABS
4.0000 mg | ORAL_TABLET | Freq: Three times a day (TID) | ORAL | Status: DC | PRN
Start: 2014-07-04 — End: 2014-07-14
  Administered 2014-07-06: 4 mg via ORAL
  Filled 2014-07-04: qty 1

## 2014-07-04 MED ORDER — FUROSEMIDE 10 MG/ML IJ SOLN
40.0000 mg | Freq: Two times a day (BID) | INTRAMUSCULAR | Status: DC
  Administered 2014-07-04 – 2014-07-05 (×4): 40 mg via INTRAVENOUS
  Filled 2014-07-04 (×6): qty 4

## 2014-07-04 MED ORDER — FERROUS SULFATE 325 (65 FE) MG PO TABS
325.0000 mg | ORAL_TABLET | Freq: Two times a day (BID) | ORAL | Status: DC
  Administered 2014-07-04 – 2014-07-05 (×2): 325 mg via ORAL
  Filled 2014-07-04 (×4): qty 1

## 2014-07-04 MED ORDER — SODIUM CHLORIDE 0.9 % IJ SOLN
3.0000 mL | Freq: Two times a day (BID) | INTRAMUSCULAR | Status: DC
  Administered 2014-07-04 – 2014-07-14 (×20): 3 mL via INTRAVENOUS

## 2014-07-04 MED ORDER — LEVOTHYROXINE SODIUM 75 MCG PO TABS
75.0000 ug | ORAL_TABLET | Freq: Every day | ORAL | Status: DC
  Administered 2014-07-05 – 2014-07-14 (×10): 75 ug via ORAL
  Filled 2014-07-04 (×11): qty 1

## 2014-07-04 MED ORDER — ONDANSETRON HCL 4 MG/2ML IJ SOLN
4.0000 mg | Freq: Three times a day (TID) | INTRAMUSCULAR | Status: DC | PRN
  Administered 2014-07-04 – 2014-07-10 (×2): 4 mg via INTRAVENOUS
  Filled 2014-07-04 (×2): qty 2

## 2014-07-04 MED ORDER — ACETAMINOPHEN 325 MG PO TABS
650.0000 mg | ORAL_TABLET | ORAL | Status: DC | PRN
  Administered 2014-07-04 – 2014-07-14 (×8): 650 mg via ORAL
  Filled 2014-07-04 (×8): qty 2

## 2014-07-04 MED ORDER — OXYCODONE HCL 5 MG PO TABS
5.0000 mg | ORAL_TABLET | Freq: Once | ORAL | Status: AC
  Administered 2014-07-04: 5 mg via ORAL
  Filled 2014-07-04: qty 1

## 2014-07-04 MED ORDER — SODIUM CHLORIDE 0.9 % IV SOLN: 250.0000 mL | INTRAVENOUS | Status: DC | PRN

## 2014-07-04 NOTE — Progress Notes (Signed)
[name] Date of Birth: [birth year] Medical Record #[id number]  History of Present Illness: [name] is a 80 y.o. female with the history of nonobstructive CAD by cardiac catheterization in 01/2014, persistent atrial fibrillation, diastolic CHF, HTN, HL, CKD.   Patient was admitted 04/2014 x2 with acute on chronic diastolic CHF. She underwent cardioversion with restoration of NSR. This resulted in profound bradycardia and hypotension requiring pacemaker implantation. Pacemaker implant was complicated by acute blood loss anemia requiring transfusion with PRBCs. Her Eliquis was held for several days. She was discharged to SNF. Carvedilol was held secondary to hypotension.   Review of hospital records demonstrates that she was in NSR at d/c. She was seen in the emergency room 6/25 with chest discomfort. ECG at that time demonstrated atrial fibrillation. Has been back here and saw Richardson Dopp, [place] in July and then Dr. Harrington Challenger earlier this August. Difficult to say if she felt better in sinus - Dr. Harrington Challenger elected to leave her in AF with rate control and anticoagulation. Has been placed on amiodarone - more for rate control.   Most recently in the hospital and discharged this past Monday with shortness of breath, weight gain and chest pain. Saw a covering MD for Dr. Regis Bill yesterday - more anemic. More heart failure symptoms. Poor rate control with her AF. She refused admission.   Comes back today for evaluation. Here with her daughter. She remains short of breath. 2 pillow orthopnea. Still with swelling. Anxious and air hungry at times. Had a normal hemoglobin a month ago. Her stools are quite dark - has just started iron. No BM today. Some chest heaviness as well. She feels her heart racing.   Studies:  - LHC (3/15): Mid LM 20-30%, mid RCA 30-40%, EF 65%  - Echo (4/15): Mild LVH, EF 60-65%, no RWMA, mild BAE, trivial eff  - Carotid US (5/15): 40-59% RICA stenosis. 9-32% LICA stenosis.     Current Outpatient Prescriptions  Medication Sig Dispense Refill  . amiodarone (PACERONE) 200 MG tablet Take 1 tablet (200 mg total) by mouth 2 (two) times daily. BID for 2 weeks then 1 daily  60 tablet  0  . apixaban (ELIQUIS) 2.5 MG TABS tablet Take 1 tablet (2.5 mg total) by mouth 2 (two) times daily.      Marland Kitchen aspirin EC 81 MG EC tablet Take 1 tablet (81 mg total) by mouth daily.      Marland Kitchen CALCIUM PO Take 600 mg by mouth daily.       . ferrous sulfate 325 (65 FE) MG tablet Take 1 tablet (325 mg total) by mouth 2 (two) times daily with a meal.  60 tablet  1  . furosemide (LASIX) 20 MG tablet Take 1 tablet (20 mg total) by mouth 2 (two) times daily.  60 tablet  3  . levothyroxine (SYNTHROID, LEVOTHROID) 75 MCG tablet Take 1 tablet (75 mcg total) by mouth daily.  90 tablet  2  . loratadine (CLARITIN) 10 MG tablet Take 10 mg by mouth daily as needed for allergies.       . metoprolol tartrate (LOPRESSOR) 25 MG tablet Take 0.5 tablets (12.5 mg total) by mouth 2 (two) times daily.      . nitroGLYCERIN (NITROSTAT) 0.4 MG SL tablet Place 1 tablet (0.4 mg total) under the tongue every 5 (five) minutes as needed. For chest pain  25 tablet  6  . simvastatin (ZOCOR) 10 MG tablet Take 1 tablet (10 mg total) by mouth  at bedtime.       No current facility-administered medications for this visit.    No Known Allergies  Past Medical History  Diagnosis Date  . Dyslipidemia   . Hypertension   . Palpitations     PVCs/bigeminy on event in May 2009 revealing this was relatively asymptomatic  . Chest pain     a. 2011 Neg MV;  b. 01/2014 Cath: LM 20-30, LAD nl, D1 nl, LCX nl, OM1 nl, RCA dom 30-19m, PD/PL nl, EF 65%->Med Rx.  . Degenerative joint disease   . Hx of varicella   . History of skin cancer     tafeen/ non melanoma.   . Thrombocytopenia   . Anemia   . Monoclonal gammopathies   . Right lower quadrant abdominal pain 07/19/2012    Recurrent  With nausea    This is new since she had ct for llq pain  in [facility]    R/o appendiceal problem  Hernia  Get ct scan  And plan  Fu     . Diastolic CHF, acute on chronic 01/31/2012  . Atrial fibrillation     a. Dx 01/2014->Eliquis started.    Past Surgical History  Procedure Laterality Date  . Cholecystectomy  1999  . Cesarean section      times 2  . Shoulder surgery  1996    Right   . Cataract extraction      Bilateral  implantt  . Cardioversion N/A 02/26/2014    Procedure: CARDIOVERSION AT BEDSIDE;  Surgeon: Pixie Casino, MD;  Location: [place];  Service: Cardiovascular;  Laterality: N/A;  . Cardioversion N/A 04/22/2014    Procedure: CARDIOVERSION (BEDSIDE);  Surgeon: [name], MD;  [facility];  Service: Cardiovascular;  Laterality: N/A;  . Pacemaker insertion  04/23/2014    STJ Assurity dual chamber pacemaker implanted by Dr Rayann Heman    History  Smoking status  . Never Smoker   Smokeless tobacco  . Never Used    History  Alcohol Use No    Family History  Problem Relation Age of Onset  . Coronary artery disease Father   . Heart disease Father   . Lung cancer    . Alzheimer's disease Mother   . Cancer Brother 48    lung cancer  . Heart attack Father     Review of Systems: The review of systems is per the HPI.  All other systems were reviewed and are negative.  Physical Exam: BP 110/70  Pulse 94  Ht 4\' 10"  (1.473 m)  Wt 156 lb 1.9 oz (70.816 kg)  BMI 32.64 kg/m2  SpO2 97% Patient is quite anxious. Skin is warm and dry. Color is normal.  HEENT is unremarkable. Normocephalic/atraumatic. PERRL. Sclera are nonicteric. Neck is supple. No masses. No JVD. Lungs are clear. Cardiac exam shows an irregular rate and rhythm. Rate is about 100 by me. Abdomen is soft. Extremities are with 1+ edema. Gait and ROM are intact. She is using a walker. No gross neurologic deficits noted.  Wt Readings from Last 3 Encounters:  07/04/14 156 lb 1.9 oz (70.816 kg)  07/03/14 157 lb (71.215 kg)  06/29/14 154 lb 1.6 oz (69.899 kg)     LABORATORY DATA/PROCEDURES:  Lab Results  Component Value Date   WBC 7.7 06/28/2014   HGB 8.9* 07/03/2014   HCT 28.4* 06/28/2014   PLT 147* 06/28/2014   GLUCOSE 93 07/03/2014   CHOL 141 04/14/2014   TRIG 56 04/14/2014   HDL 89  04/14/2014   LDLCALC 41 04/14/2014   ALT 8 04/14/2014   AST 12 04/14/2014   NA 135 07/03/2014   K 4.9 07/03/2014   CL 100 07/03/2014   CREATININE 1.5* 07/03/2014   BUN 25* 07/03/2014   CO2 29 07/03/2014   TSH 2.560 06/28/2014   INR 1.64* 04/23/2014   CBC Latest Ref Rng 07/03/2014 06/28/2014 06/27/2014  WBC 4.0 - 10.5 K/uL - 7.7 6.3  Hemoglobin 12.2 - 16.2 g/dL 8.9(A) 8.9(L) 9.7(L)  Hematocrit 36.0 - 46.0 % - 28.4(L) 31.2(L)  Platelets 150 - 400 K/uL - 147(L) 167    BNP (last 3 results)  Recent Labs  05/08/14 1727 06/16/14 1237 06/27/14 1721  PROBNP 2511.0* 543.0* 1950.0*   Echo Study Conclusions from April 2015  - Left ventricle: The cavity size was normal. Wall thickness was increased in a pattern of mild LVH. There was focal basal hypertrophy. Systolic function was normal. The estimated ejection fraction was in the range of 60% to 65%. Wall motion was normal; there were no regional wall motion abnormalities. - Left atrium: The atrium was mildly dilated. - Right atrium: The atrium was mildly dilated. - Pericardium, extracardiac: A trivial pericardial effusion was identified.    Assessment / Plan: 1. Acute diastolic HF - more CHF symptoms - most likely driven by her anemia and AF.  2. AF with uncertain rate control -  Now on amiodarone - may need to consider repeat cardioversion since she is on amiodarone - but stopping Eliquis today. Recheck labs.   3. Anemia - probable GI bleed - her blood count was normal a month ago. Stools are dark. She will need GI consult.  Will stop her Eliquis. Will need repeat labs. Will need Gi consult.   Patient has been seen with Dr. Burt Knack who is in agreement with this plan of care.   Patient is agreeable to this plan.    Burtis Junes, RN, [place] [address] [place], [place]  [address] [phone]

## 2014-07-04 NOTE — Telephone Encounter (Signed)
please see all of the phone messages back and forth from cardiology and home health ' Pt has appt next week with me and to see cards today .  Was seen yesterday by dr Yong Channel.

## 2014-07-04 NOTE — Telephone Encounter (Signed)
[name] do you have the number for Dr. Harrington Challenger office?

## 2014-07-04 NOTE — H&P (Signed)
[name]  07/04/2014 11:30 AM   Office Visit  MRN:  [id number]   Description: 80 year old female  Provider: Burtis Junes, NP  Department: [facility]          Vital Signs Most recent update: 07/04/2014 11:48 AM by Tamsen Snider      BP Pulse Ht Wt BMI Sp02      110/70 94 4\' 10"  (1.473 m) 156 lb 1.9 oz (70.816 kg) 32.64 kg/m2 97% Comment: at rest        Vitals History Recorded                Progress Notes      Burtis Junes, NP at 07/04/2014 11:43 AM      Status: Signed               [name] Date of Birth: [birth year] Medical Record #[id number]   History of Present Illness: [name] is a 80 y.o. female with the history of nonobstructive CAD by cardiac catheterization in 01/2014, persistent atrial fibrillation, diastolic CHF, HTN, HL, CKD.    Patient was admitted 04/2014 x2 with acute on chronic diastolic CHF. She underwent cardioversion with restoration of NSR. This resulted in profound bradycardia and hypotension requiring pacemaker implantation. Pacemaker implant was complicated by acute blood loss anemia requiring transfusion with PRBCs. Her Eliquis was held for several days. She was discharged to SNF. Carvedilol was held secondary to hypotension.    Review of hospital records demonstrates that she was in NSR at d/c. She was seen in the emergency room 6/25 with chest discomfort. ECG at that time demonstrated atrial fibrillation. Has been back here and saw Richardson Dopp, [place] in July and then Dr. Harrington Challenger earlier this August. Difficult to say if she felt better in sinus - Dr. Harrington Challenger elected to leave her in AF with rate control and anticoagulation. Has been placed on amiodarone - more for rate control.    Most recently in the hospital and discharged this past Monday with shortness of breath, weight gain and chest pain. Saw a covering MD for Dr. Regis Bill yesterday - more anemic. More heart failure symptoms. Poor rate control with her AF.  She refused admission.    Comes back today for evaluation. Here with her daughter. She remains short of breath. 2 pillow orthopnea. Still with swelling. Anxious and air hungry at times. Had a normal hemoglobin a month ago. Her stools are quite dark - has just started iron. No BM today. Some chest heaviness as well. She feels her heart racing.     Studies:   - LHC (3/15): Mid LM 20-30%, mid RCA 30-40%, EF 65%   - Echo (4/15): Mild LVH, EF 60-65%, no RWMA, mild BAE, trivial eff   - Carotid US (5/15): 40-59% RICA stenosis. 0-62% LICA stenosis.       Current Outpatient Prescriptions   Medication  Sig  Dispense  Refill   .  amiodarone (PACERONE) 200 MG tablet  Take 1 tablet (200 mg total) by mouth 2 (two) times daily. BID for 2 weeks then 1 daily   60 tablet   0   .  apixaban (ELIQUIS) 2.5 MG TABS tablet  Take 1 tablet (2.5 mg total) by mouth 2 (two) times daily.         Marland Kitchen  aspirin EC 81 MG EC tablet  Take 1 tablet (81 mg total) by mouth daily.         Marland Kitchen  CALCIUM PO  Take 600 mg by mouth daily.          .  ferrous sulfate 325 (65 FE) MG tablet  Take 1 tablet (325 mg total) by mouth 2 (two) times daily with a meal.   60 tablet   1   .  furosemide (LASIX) 20 MG tablet  Take 1 tablet (20 mg total) by mouth 2 (two) times daily.   60 tablet   3   .  levothyroxine (SYNTHROID, LEVOTHROID) 75 MCG tablet  Take 1 tablet (75 mcg total) by mouth daily.   90 tablet   2   .  loratadine (CLARITIN) 10 MG tablet  Take 10 mg by mouth daily as needed for allergies.          .  metoprolol tartrate (LOPRESSOR) 25 MG tablet  Take 0.5 tablets (12.5 mg total) by mouth 2 (two) times daily.         .  nitroGLYCERIN (NITROSTAT) 0.4 MG SL tablet  Place 1 tablet (0.4 mg total) under the tongue every 5 (five) minutes as needed. For chest pain   25 tablet   6   .  simvastatin (ZOCOR) 10 MG tablet  Take 1 tablet (10 mg total) by mouth at bedtime.             No current facility-administered medications for this visit.         No Known Allergies    Past Medical History   Diagnosis  Date   .  Dyslipidemia     .  Hypertension     .  Palpitations         PVCs/bigeminy on event in May 2009 revealing this was relatively asymptomatic   .  Chest pain         a. 2011 Neg MV;  b. 01/2014 Cath: LM 20-30, LAD nl, D1 nl, LCX nl, OM1 nl, RCA dom 30-44m, PD/PL nl, EF 65%->Med Rx.   .  Degenerative joint disease     .  Hx of varicella     .  History of skin cancer         tafeen/ non melanoma.    .  Thrombocytopenia     .  Anemia     .  Monoclonal gammopathies     .  Right lower quadrant abdominal pain  07/19/2012       Recurrent  With nausea    This is new since she had ct for llq pain in [facility]    R/o appendiceal problem  Hernia  Get ct scan  And plan  Fu      .  Diastolic CHF, acute on chronic  01/31/2012   .  Atrial fibrillation         a. Dx 01/2014->Eliquis started.         Past Surgical History   Procedure  Laterality  Date   .  Cholecystectomy    1999   .  Cesarean section           times 2   .  Shoulder surgery    1996       Right    .  Cataract extraction           Bilateral  implantt   .  Cardioversion  N/A  02/26/2014       Procedure: CARDIOVERSION AT BEDSIDE;  Surgeon: Pixie Casino, MD;  Location: [place];  Service: Cardiovascular;  Laterality: N/A;   .  Cardioversion  N/A  04/22/2014       Procedure: CARDIOVERSION (BEDSIDE);  Surgeon: [name], MD;  Location[facility];  Service: Cardiovascular;  Laterality: N/A;   .  Pacemaker insertion    04/23/2014       STJ Assurity dual chamber pacemaker implanted by Dr Rayann Heman         History   Smoking status   .  Never Smoker    Smokeless tobacco   .  Never Used         History   Alcohol Use  No         Family History   Problem  Relation  Age of Onset   .  Coronary artery disease  Father     .  Heart disease  Father     .  Lung cancer       .  Alzheimer's disease  Mother     .  Cancer  Brother  75       lung cancer   .  Heart  attack  Father          Review of Systems: The review of systems is per the HPI.  All other systems were reviewed and are negative.   Physical Exam: BP 110/70  Pulse 94  Ht 4\' 10"  (1.473 m)  Wt 156 lb 1.9 oz (70.816 kg)  BMI 32.64 kg/m2  SpO2 97% Patient is quite anxious. Skin is warm and dry. Color is normal.  HEENT is unremarkable. Normocephalic/atraumatic. PERRL. Sclera are nonicteric. Neck is supple. No masses. No JVD. Lungs are clear. Cardiac exam shows an irregular rate and rhythm. Rate is about 100 by me. Abdomen is soft. Extremities are with 1+ edema. Gait and ROM are intact. She is using a walker. No gross neurologic deficits noted.    Wt Readings from Last 3 Encounters:   07/04/14  156 lb 1.9 oz (70.816 kg)   07/03/14  157 lb (71.215 kg)   06/29/14  154 lb 1.6 oz (69.899 kg)        LABORATORY DATA/PROCEDURES:    Lab Results   Component  Value  Date     WBC  7.7  06/28/2014     HGB  8.9*  07/03/2014     HCT  28.4*  06/28/2014     PLT  147*  06/28/2014     GLUCOSE  93  07/03/2014     CHOL  141  04/14/2014     TRIG  56  04/14/2014     HDL  89  04/14/2014     LDLCALC  41  04/14/2014     ALT  8  04/14/2014     AST  12  04/14/2014     NA  135  07/03/2014     K  4.9  07/03/2014     CL  100  07/03/2014     CREATININE  1.5*  07/03/2014     BUN  25*  07/03/2014     CO2  29  07/03/2014     TSH  2.560  06/28/2014     INR  1.64*  04/23/2014       CBC  Latest Ref Rng  07/03/2014  06/28/2014  06/27/2014   WBC  4.0 - 10.5 K/uL  -  7.7  6.3   Hemoglobin  12.2 - 16.2 g/dL  8.9(A)  8.9(L)  9.7(L)   Hematocrit  36.0 - 46.0 %  -  28.4(L)  31.2(L)   Platelets  150 - 400 K/uL  -  147(L)  167        BNP (last 3 results)   Recent Labs   05/08/14 1727  06/16/14 1237  06/27/14 1721   PROBNP  2511.0*  543.0*  1950.0*      Echo Study Conclusions from April 2015  - Left ventricle: The cavity size was normal. Wall thickness was increased in a pattern of mild LVH. There was focal basal  hypertrophy. Systolic function was normal. The estimated ejection fraction was in the range of 60% to 65%. Wall motion was normal; there were no regional wall motion abnormalities. - Left atrium: The atrium was mildly dilated. - Right atrium: The atrium was mildly dilated. - Pericardium, extracardiac: A trivial pericardial effusion was identified.       Assessment / Plan: 1. Acute diastolic HF - more CHF symptoms - most likely driven by her anemia and AF.   2. AF with uncertain rate control -  Now on amiodarone - may need to consider repeat cardioversion since she is on amiodarone - but stopping Eliquis today. Recheck labs.    3. Anemia - probable GI bleed - her blood count was normal a month ago. Stools are dark. She will need GI consult.   Will stop her Eliquis and her aspirin. Will need repeat labs. Diurese with IV Lasix. Will need Gi consult.    Patient has been seen with Dr. Burt Knack who is in agreement with this plan of care.    Patient is agreeable to this plan.     Burtis Junes, RN, [place] [address] [place], [place]  [address] [phone]                    Encounter-Level Documents:            Electronic signature on 07/04/2014 11:28 AM            Referring Provider      Burnis Medin, MD           Diagnoses      Acute on chronic diastolic CHF (congestive heart failure)    -  Primary      ICD-9-CM: 428.33,  428.0 ICD-10-CM: I50.33      Chronic atrial fibrillation          ICD-9-CM: 427.31 ICD-10-CM: I48.2      Chronic diastolic heart failure          ICD-9-CM: 428.32 ICD-10-CM: I50.32      Essential hypertension, benign          ICD-9-CM: 401.1 ICD-10-CM: I10             Reason for Visit      Follow-up      Post ER visit - seen for Dr. Harrington Challenger.                           Level of Service      PR OFFICE OUTPATIENT VISIT 25 MINUTES [99214]            Follow-up and Disposition      Routing History Recorded             All Charges for This Encounter      Code Description Service Date Service Provider Modifiers Qty      [phone] PR OFFICE OUTPATIENT VISIT 25 MINUTES  07/04/2014 Burtis Junes, NP   1             Patient Instructions      We are admitting you to the hospital.              Patient Instructions History Recorded               Previous Visit        Provider Department Encounter #      07/04/2014  8:52 AM Dorris Carnes, MD [facility] [id number]

## 2014-07-04 NOTE — Telephone Encounter (Signed)
Pt is very upset, states [name] cancelled her appt with dr. Harrington Challenger and she doesn't understand why. Pt states it took her months to get the appt and she want to keep the appt. Wants to know if [name] can call back and get her back on the schedule for Monday.

## 2014-07-04 NOTE — Telephone Encounter (Signed)
google [facility] and number will come up. That's what I did yesterday.

## 2014-07-04 NOTE — Telephone Encounter (Signed)
Followed up with Dr. Harrington Challenger office and pt is at the office now being seen

## 2014-07-04 NOTE — Patient Instructions (Addendum)
We are admitting you to the hospital. 

## 2014-07-04 NOTE — Progress Notes (Signed)
Report given to receiving RN. Patient sitting in recliner. No verbal complaints and no signs or symptoms of distress or discomfort noted.

## 2014-07-04 NOTE — Telephone Encounter (Signed)
Spoke with pt. Appt was arranged with Truitt Merle, NP today after visit with primary care yesterday. Pt was scheduled to see Dr. Harrington Challenger on July 07, 2014 and this appt was cancelled due to appt scheduled for today. Pt concerned about when she will see Dr. Harrington Challenger again. I told pt follow up would be arranged at appt today. Pt will be here for appt with Truitt Merle, NP today

## 2014-07-04 NOTE — Telephone Encounter (Signed)
New message      Pt request a nurse to call her regarding her appt today with Cecille Rubin

## 2014-07-05 ENCOUNTER — Observation Stay (HOSPITAL_COMMUNITY): Payer: Medicare Other

## 2014-07-05 ENCOUNTER — Inpatient Hospital Stay (HOSPITAL_COMMUNITY): Payer: Medicare Other

## 2014-07-05 DIAGNOSIS — Z888 Allergy status to other drugs, medicaments and biological substances status: Secondary | ICD-10-CM | POA: Diagnosis not present

## 2014-07-05 DIAGNOSIS — K219 Gastro-esophageal reflux disease without esophagitis: Secondary | ICD-10-CM | POA: Diagnosis present

## 2014-07-05 DIAGNOSIS — N183 Chronic kidney disease, stage 3 unspecified: Secondary | ICD-10-CM | POA: Diagnosis present

## 2014-07-05 DIAGNOSIS — N179 Acute kidney failure, unspecified: Secondary | ICD-10-CM | POA: Diagnosis present

## 2014-07-05 DIAGNOSIS — E785 Hyperlipidemia, unspecified: Secondary | ICD-10-CM | POA: Diagnosis present

## 2014-07-05 DIAGNOSIS — K2960 Other gastritis without bleeding: Secondary | ICD-10-CM | POA: Diagnosis present

## 2014-07-05 DIAGNOSIS — Z85828 Personal history of other malignant neoplasm of skin: Secondary | ICD-10-CM | POA: Diagnosis not present

## 2014-07-05 DIAGNOSIS — I4891 Unspecified atrial fibrillation: Secondary | ICD-10-CM | POA: Diagnosis present

## 2014-07-05 DIAGNOSIS — K921 Melena: Secondary | ICD-10-CM | POA: Diagnosis present

## 2014-07-05 DIAGNOSIS — Z950 Presence of cardiac pacemaker: Secondary | ICD-10-CM | POA: Diagnosis not present

## 2014-07-05 DIAGNOSIS — D509 Iron deficiency anemia, unspecified: Secondary | ICD-10-CM | POA: Diagnosis present

## 2014-07-05 DIAGNOSIS — I5033 Acute on chronic diastolic (congestive) heart failure: Secondary | ICD-10-CM | POA: Diagnosis present

## 2014-07-05 DIAGNOSIS — M545 Low back pain, unspecified: Secondary | ICD-10-CM | POA: Diagnosis present

## 2014-07-05 DIAGNOSIS — E039 Hypothyroidism, unspecified: Secondary | ICD-10-CM | POA: Diagnosis present

## 2014-07-05 DIAGNOSIS — R0602 Shortness of breath: Secondary | ICD-10-CM

## 2014-07-05 DIAGNOSIS — R4181 Age-related cognitive decline: Secondary | ICD-10-CM | POA: Diagnosis present

## 2014-07-05 DIAGNOSIS — K5900 Constipation, unspecified: Secondary | ICD-10-CM | POA: Diagnosis present

## 2014-07-05 DIAGNOSIS — Z8249 Family history of ischemic heart disease and other diseases of the circulatory system: Secondary | ICD-10-CM | POA: Diagnosis not present

## 2014-07-05 DIAGNOSIS — I129 Hypertensive chronic kidney disease with stage 1 through stage 4 chronic kidney disease, or unspecified chronic kidney disease: Secondary | ICD-10-CM | POA: Diagnosis present

## 2014-07-05 DIAGNOSIS — Z801 Family history of malignant neoplasm of trachea, bronchus and lung: Secondary | ICD-10-CM | POA: Diagnosis not present

## 2014-07-05 DIAGNOSIS — J9600 Acute respiratory failure, unspecified whether with hypoxia or hypercapnia: Secondary | ICD-10-CM | POA: Diagnosis present

## 2014-07-05 DIAGNOSIS — R627 Adult failure to thrive: Secondary | ICD-10-CM | POA: Diagnosis present

## 2014-07-05 DIAGNOSIS — I509 Heart failure, unspecified: Secondary | ICD-10-CM | POA: Diagnosis present

## 2014-07-05 DIAGNOSIS — I5023 Acute on chronic systolic (congestive) heart failure: Secondary | ICD-10-CM | POA: Diagnosis present

## 2014-07-05 DIAGNOSIS — Z7901 Long term (current) use of anticoagulants: Secondary | ICD-10-CM | POA: Diagnosis not present

## 2014-07-05 DIAGNOSIS — Z79899 Other long term (current) drug therapy: Secondary | ICD-10-CM | POA: Diagnosis not present

## 2014-07-05 DIAGNOSIS — Z820 Family history of epilepsy and other diseases of the nervous system: Secondary | ICD-10-CM | POA: Diagnosis not present

## 2014-07-05 DIAGNOSIS — G8929 Other chronic pain: Secondary | ICD-10-CM | POA: Diagnosis present

## 2014-07-05 DIAGNOSIS — K449 Diaphragmatic hernia without obstruction or gangrene: Secondary | ICD-10-CM | POA: Diagnosis present

## 2014-07-05 DIAGNOSIS — D500 Iron deficiency anemia secondary to blood loss (chronic): Secondary | ICD-10-CM

## 2014-07-05 DIAGNOSIS — N289 Disorder of kidney and ureter, unspecified: Secondary | ICD-10-CM

## 2014-07-05 DIAGNOSIS — I2510 Atherosclerotic heart disease of native coronary artery without angina pectoris: Secondary | ICD-10-CM | POA: Diagnosis present

## 2014-07-05 LAB — URINALYSIS, ROUTINE W REFLEX MICROSCOPIC
Bilirubin Urine: NEGATIVE
Glucose, UA: NEGATIVE mg/dL
Hgb urine dipstick: NEGATIVE
KETONES UR: NEGATIVE mg/dL
Leukocytes, UA: NEGATIVE
Nitrite: NEGATIVE
Protein, ur: NEGATIVE mg/dL
Specific Gravity, Urine: 1.011 (ref 1.005–1.030)
UROBILINOGEN UA: 1 mg/dL (ref 0.0–1.0)
pH: 5.5 (ref 5.0–8.0)

## 2014-07-05 LAB — BASIC METABOLIC PANEL
Anion gap: 14 (ref 5–15)
BUN: 23 mg/dL (ref 6–23)
CO2: 24 mEq/L (ref 19–32)
Calcium: 9 mg/dL (ref 8.4–10.5)
Chloride: 102 mEq/L (ref 96–112)
Creatinine, Ser: 1.55 mg/dL — ABNORMAL HIGH (ref 0.50–1.10)
GFR calc Af Amer: 34 mL/min — ABNORMAL LOW (ref >90)
GFR calc non Af Amer: 29 mL/min — ABNORMAL LOW (ref >90)
Glucose, Bld: 106 mg/dL — ABNORMAL HIGH (ref 70–99)
Potassium: 4 mEq/L (ref 3.7–5.3)
Sodium: 140 mEq/L (ref 137–147)

## 2014-07-05 LAB — TROPONIN I: Troponin I: <0.3 ng/mL (ref <0.30)

## 2014-07-05 MED ORDER — DOCUSATE SODIUM 100 MG PO CAPS
100.0000 mg | ORAL_CAPSULE | Freq: Two times a day (BID) | ORAL | Status: DC
  Administered 2014-07-05 – 2014-07-08 (×6): 100 mg via ORAL
  Filled 2014-07-05 (×10): qty 1

## 2014-07-05 MED ORDER — SODIUM CHLORIDE 0.9 % IV SOLN
INTRAVENOUS | Status: DC
  Administered 2014-07-05: 23:00:00 via INTRAVENOUS
  Administered 2014-07-06: 500 mL via INTRAVENOUS

## 2014-07-05 MED ORDER — SENNA 8.6 MG PO TABS
2.0000 | ORAL_TABLET | Freq: Every day | ORAL | Status: DC
  Administered 2014-07-05 – 2014-07-14 (×7): 17.2 mg via ORAL
  Filled 2014-07-05 (×10): qty 2

## 2014-07-05 MED ORDER — POLYETHYLENE GLYCOL 3350 17 G PO PACK
17.0000 g | PACK | Freq: Two times a day (BID) | ORAL | Status: DC
  Administered 2014-07-05 – 2014-07-12 (×7): 17 g via ORAL
  Filled 2014-07-05 (×20): qty 1

## 2014-07-05 MED ORDER — METOPROLOL TARTRATE 25 MG PO TABS
25.0000 mg | ORAL_TABLET | Freq: Two times a day (BID) | ORAL | Status: DC
  Administered 2014-07-05: 12.5 mg via ORAL
  Administered 2014-07-06 – 2014-07-14 (×17): 25 mg via ORAL
  Filled 2014-07-05 (×19): qty 1

## 2014-07-05 NOTE — Progress Notes (Signed)
Patient is sleeping peacefully. No signs/symptoms of discomfort/distress noted at present time. Will continue to monitor.  Esperanza Heir, RN

## 2014-07-05 NOTE — Consult Note (Signed)
Medical Consultation  [name]:[id number] DOB: 1925-10-10 DOA: 07/04/2014 PCP: [name], MD   Requesting physician: Allred Date of consultation: 07/05/14 Reason for consultation: medical management  Impression/Recommendations Iron deficiency anemia/melanotic stool -The patient's hemoglobin has gradually dropped over the past 2 months -Although she has had a hemoglobin of 13.6 on 05/08/2014, the overall trend of her hemoglobin over the past 3 months has been 11-12  -Case was discussed with Dr. Verlin Fester has called the GI consult -Iron saturation 3% with low ferritin--06/27/2014 -continue iron supplementation acute on chronic diastolic CHF  -57/84/6962 after she was EF 60-65%, -Continue IV furosemide per cardiology  -Discharge weight on 06/29/2014--69.9 kg  -Daily weights  -Fluid restrict  atrial fibrillation  -Continue metoprolol tartrate and amiodarone  -Rate controlled  -Apixiban presently on hold pending GI evaluation   hypothyroidism -Continue Synthroid -TSH 2.760 Hyperlipidemia -Continue statin CKD stage III -Since early July, baseline creatinine has been 1.2-1.5 -Prior to June 2015--baseline creatinine 0.9-1.2 -Continue to monitor with diuresis Low back pain -UA -lumbar xray     Chief Complaint: dyspnea  HPI:  80 year old female with nonobstructive CAD, diastolic CHF, permanent atrial fibrillation, CKD stage III, hyperlipidemia, hypothyroidism, and iron deficiency anemia was admitted on 07/04/2014 because of shortness of breath, orthopnea, and leg edema. She was admitted to the cardiology service for acute on chronic diastolic CHF. The patient was started on intravenous furosemide 40 mg IV every 12 hours. She has had a good clinical response.  Dr. Rayann Heman has requested the hospitalist service assume care of patient due to patient's multiple co-morbidities.  Notably, the patient was recently discharged from the hospital on 06/30/2014 acute  on chronic CHF. Her weight at the time of discharge was 69.9 kg. The patient was seen in her primary care provider's office on 07/03/2014 and refused admission. After being seen by her cardiologist on 07/04/2014, the patient agreed for admission.Patient was admitted 04/2014 x2 with acute on chronic diastolic CHF. She underwent cardioversion with restoration of NSR. This resulted in profound bradycardia and hypotension requiring pacemaker implantation. Pacemaker implant was complicated by acute blood loss anemia requiring transfusion with PRBCs. Her Eliquis was held for several days. She was discharged to SNF. Carvedilol was held secondary to hypotension. The apixiban has been since restarted. However, the patient reported melanotic stools prior to admission. She denies any hematochezia or hematemesis. She states that she was recently started on iron supplementation.   Review of Systems:  Constitutional:  No weight loss, night sweats, Fevers, chills Head&Eyes: No headache.  No vision loss.  No eye pain or scotoma ENT:  No Difficulty swallowing,Tooth/dental problems,Sore throat,  No ear ache, post nasal drip,  Cardio-vascular:  No chest pain, Orthopnea, PND, swelling in lower extremities,  dizziness, palpitations  GI:  No heartburn, indigestion, abdominal pain, nausea, vomiting, diarrhea, loss of appetite, hematochezia, melena Resp:  No shortness of breath with exertion or at rest. No excess mucus, no productive cough, No non-productive cough, No coughing up of blood.No change in color of mucus.No wheezing.No chest wall deformity  Skin:  no rash or lesions.  GU:  no dysuria, change in color of urine, no urgency or frequency. No flank pain.  Musculoskeletal:  No joint pain or swelling. No decreased range of motion. No back pain.  Psych:  No change in mood or affect. No depression or anxiety. Neurologic: No headache, no dysesthesia, no focal weakness, no vision loss. No syncope   Past Medical  History  Diagnosis Date  . Dyslipidemia   .  Hypertension   . Palpitations     PVCs/bigeminy on event in May 2009 revealing this was relatively asymptomatic  . Chest pain     a. 2011 Neg MV;  b. 01/2014 Cath: LM 20-30, LAD nl, D1 nl, LCX nl, OM1 nl, RCA dom 30-33m, PD/PL nl, EF 65%->Med Rx.  . Degenerative joint disease   . Hx of varicella   . History of skin cancer     tafeen/ non melanoma.   . Thrombocytopenia   . Anemia   . Monoclonal gammopathies   . Right lower quadrant abdominal pain 07/19/2012    Recurrent  With nausea    This is new since she had ct for llq pain in [facility]    R/o appendiceal problem  Hernia  Get ct scan  And plan  Fu     . Diastolic CHF, acute on chronic 01/31/2012  . Atrial fibrillation     a. Dx 01/2014->Eliquis started.  . Cancer   . Pacemaker   . Dysrhythmia    Past Surgical History  Procedure Laterality Date  . Cholecystectomy  1999  . Cesarean section      times 2  . Shoulder surgery  1996    Right   . Cataract extraction      Bilateral  implantt  . Cardioversion N/A 02/26/2014    Procedure: CARDIOVERSION AT BEDSIDE;  Surgeon: Pixie Casino, MD;  Location: [facility];  Service: Cardiovascular;  Laterality: N/A;  . Cardioversion N/A 04/22/2014    Procedure: CARDIOVERSION (BEDSIDE);  Surgeon: [name], MD;  Location: [facility] OR;  Service: Cardiovascular;  Laterality: N/A;  . Pacemaker insertion  04/23/2014    STJ Assurity dual chamber pacemaker implanted by Dr Rayann Heman  . Insert / replace / remove pacemaker     Social History:  reports that she has never smoked. She has never used smokeless tobacco. She reports that she does not drink alcohol or use illicit drugs.  Family History  Problem Relation Age of Onset  . Coronary artery disease Father   . Heart disease Father   . Lung cancer    . Alzheimer's disease Mother   . Cancer Brother 42    lung cancer  . Heart attack Father     No Known Allergies   Prior to Admission medications   Medication Sig  Start Date End Date Taking? Authorizing Provider  amiodarone (PACERONE) 200 MG tablet Take 1 tablet (200 mg total) by mouth 2 (two) times daily. BID for 2 weeks then 1 daily 06/30/14  Yes Thurnell Lose, MD  apixaban (ELIQUIS) 2.5 MG TABS tablet Take 1 tablet (2.5 mg total) by mouth 2 (two) times daily. 05/02/14  Yes Domenic Polite, MD  aspirin 81 MG tablet Take 81 mg by mouth daily. Pt has received conflicting instructions whether or not to continue ASA but did take day PTA   Yes Historical Provider, MD  CALCIUM PO Take 600 mg by mouth daily.    Yes Historical Provider, MD  ferrous sulfate 325 (65 FE) MG tablet Take 1 tablet (325 mg total) by mouth 2 (two) times daily with a meal. 06/30/14  Yes Thurnell Lose, MD  furosemide (LASIX) 20 MG tablet Take 20 mg by mouth 2 (two) times daily. Took additional dose yesterday 06/23/14  Yes Fay Records, MD  levothyroxine (SYNTHROID, LEVOTHROID) 75 MCG tablet Take 1 tablet (75 mcg total) by mouth daily. 01/16/14  Yes Fay Records, MD  loratadine (CLARITIN) 10 MG tablet  Take 10 mg by mouth daily as needed for allergies.    Yes Historical Provider, MD  metoprolol tartrate (LOPRESSOR) 25 MG tablet Take 0.5 tablets (12.5 mg total) by mouth 2 (two) times daily. 06/11/14  Yes Scott T Kathlen Mody, PA-C  nitroGLYCERIN (NITROSTAT) 0.4 MG SL tablet Place 1 tablet (0.4 mg total) under the tongue every 5 (five) minutes as needed. For chest pain 12/02/13  Yes Fay Records, MD  simvastatin (ZOCOR) 10 MG tablet Take 1 tablet (10 mg total) by mouth at bedtime. 05/29/14  Yes Liliane Shi, PA-C    Physical Exam: Filed Vitals:   07/04/14 1531 07/04/14 2035 07/05/14 0530 07/05/14 1042  BP: 110/58 104/59 101/53 105/58  Pulse: 86 84 72 76  Temp:  97.9 F (36.6 C) 97.7 F (36.5 C) 98 F (36.7 C)  TempSrc:  Oral Oral Oral  Resp:  17 18 16   Height:  4\' 10"  (1.473 m)    Weight:   71.2 kg (156 lb 15.5 oz)   SpO2:  96% 98% 95%   General:  A&O x 3, NAD, nontoxic,  pleasant/cooperative Head/Eye: No conjunctival hemorrhage, no icterus, [place]/AT, No nystagmus ENT:  No icterus,  No thrush, good dentition, no pharyngeal exudate Neck:  No masses, no lymphadenpathy, no bruits CV:  RRR, no rub, no gallop, no S3 Lung:  CTAB, good air movement, no wheeze, no rhonchi Abdomen: soft/NT, +BS, nondistended, no peritoneal signs Ext: No cyanosis, No rashes, No petechiae, No lymphangitis, No edema Neuro: CNII-XII intact, strength 4/5 in bilateral upper and lower extremities, no dysmetria  Labs on Admission:  Basic Metabolic Panel:  Recent Labs Lab 06/29/14 0515 06/30/14 0420 07/03/14 1516 07/04/14 1349 07/05/14 0147  NA 136* 133* 135 141 140  K 4.1 4.2 4.9 4.5 4.0  CL 100 98 100 100 102  CO2 27 27 29 28 24   GLUCOSE 115* 115* 93 117* 106*  BUN 26* 22 25* 24* 23  CREATININE 1.28* 1.23* 1.5* 1.49* 1.55*  CALCIUM 9.3 9.3 9.5 9.9 9.0  MG  --   --   --  2.2  --    Liver Function Tests:  Recent Labs Lab 07/04/14 1349  AST 14  ALT 10  ALKPHOS 99  BILITOT 0.6  PROT 7.0  ALBUMIN 3.2*   No results found for this basename: LIPASE, AMYLASE,  in the last 168 hours No results found for this basename: AMMONIA,  in the last 168 hours CBC:  Recent Labs Lab 07/03/14 1524 07/04/14 1349  WBC  --  5.7  NEUTROABS  --  4.3  HGB 8.9* 9.6*  HCT  --  31.3*  MCV  --  87.2  PLT  --  217   Cardiac Enzymes:  Recent Labs Lab 07/04/14 1349 07/04/14 2016 07/05/14 0147  TROPONINI <0.30 <0.30 <0.30   BNP: No components found with this basename: POCBNP,  CBG: No results found for this basename: GLUCAP,  in the last 168 hours  Radiological Exams on Admission: Dg Chest 2 View  07/05/2014   CLINICAL DATA:  Diastolic heart failure, dyspnea and abdominal pain  EXAM: CHEST  2 VIEW  COMPARISON:  Prior chest x-ray 06/28/2014  FINDINGS: Stable position of right subclavian approach cardiac rhythm maintenance device with leads projecting over the right atrium and right  ventricle. Unchanged globular cardiomegaly. Atherosclerotic calcifications present in the transverse aorta. Linear opacities in the left mid lung are similar compared to prior and may reflect atelectasis or scarring. Persistent mild elevation of the  left hemidiaphragm. Is no new focal airspace consolidation, pulmonary edema or pneumothorax. Stable central bronchitic changes and diffuse mild interstitial prominence. No acute osseous abnormality. The visualized upper abdominal bowel gas pattern is unremarkable.  IMPRESSION: Stable chest x-ray without evidence of acute process.   Electronically Signed   By: Jacqulynn Cadet M.D.   On: 07/05/2014 09:06    EKG: Independently reviewed.  atrial fibrillation, nonspecific T wave change   Time spent: 60 min  Kimani Hovis [facility] Pager [phone]  If 7PM-7AM, please contact night-coverage [email or web address] Password [facility] 07/05/2014, 12:07 PM

## 2014-07-05 NOTE — Progress Notes (Signed)
The patient requested Claritin for her allergies and pain medicine for her chronic back pain .  Dr. Kennith Center was notified.  New orders were given for 5 mg of po Oxy IR and 10 mg of po Claritin.  The RN carried out the orders.

## 2014-07-05 NOTE — Progress Notes (Signed)
SUBJECTIVE: The patient is doing a little today.  She continues to have some SOB but feels that this continues to improve.  Her primary concern today is with being unable to have a BM.  At this time, she denies chest pain or any new concerns.  Marland Kitchen amiodarone  200 mg Oral BID  . ferrous sulfate  325 mg Oral BID WC  . furosemide  40 mg Intravenous Q12H  . levothyroxine  75 mcg Oral QAC breakfast  . metoprolol tartrate  25 mg Oral Q12H  . simvastatin  10 mg Oral q1800  . sodium chloride  3 mL Intravenous Q12H      OBJECTIVE: Physical Exam: Filed Vitals:   07/04/14 1531 07/04/14 2035 07/05/14 0530 07/05/14 1042  BP: 110/58 104/59 101/53 105/58  Pulse: 86 84 72 76  Temp:  97.9 F (36.6 C) 97.7 F (36.5 C) 98 F (36.7 C)  TempSrc:  Oral Oral Oral  Resp:  17 18 16   Height:  4\' 10"  (1.473 m)    Weight:   156 lb 15.5 oz (71.2 kg)   SpO2:  96% 98% 95%    Intake/Output Summary (Last 24 hours) at 07/05/14 1110 Last data filed at 07/05/14 1044  Gross per 24 hour  Intake    963 ml  Output    675 ml  Net    288 ml    Telemetry reveals afib, controlled V rates  GEN- The patient is elderly appearing, alert and oriented x 3 today.   Head- normocephalic, atraumatic Eyes-  Sclera clear, conjunctiva pink Ears- hearing intact Oropharynx- clear Neck- supple,  Lungs- few basilar rales, normal work of breathing Heart- irregular rate and rhythm  GI- soft, NT, ND, + BS Extremities- no clubbing, cyanosis, + dependant edema Skin- no rash or lesion Psych- euthymic mood, full affect Neuro- strength and sensation are intact  LABS: Basic Metabolic Panel:  Recent Labs  07/04/14 1349 07/05/14 0147  NA 141 140  K 4.5 4.0  CL 100 102  CO2 28 24  GLUCOSE 117* 106*  BUN 24* 23  CREATININE 1.49* 1.55*  CALCIUM 9.9 9.0  MG 2.2  --    Liver Function Tests:  Recent Labs  07/04/14 1349  AST 14  ALT 10  ALKPHOS 99  BILITOT 0.6  PROT 7.0  ALBUMIN 3.2*   No results found for  this basename: LIPASE, AMYLASE,  in the last 72 hours CBC:  Recent Labs  07/03/14 1524 07/04/14 1349  WBC  --  5.7  NEUTROABS  --  4.3  HGB 8.9* 9.6*  HCT  --  31.3*  MCV  --  87.2  PLT  --  217   Cardiac Enzymes:  Recent Labs  07/04/14 1349 07/04/14 2016 07/05/14 0147  TROPONINI <0.30 <0.30 <0.30   Thyroid Function Tests:  Recent Labs  07/04/14 1349  TSH 2.760   Anemia Panel: No results found for this basename: VITAMINB12, FOLATE, FERRITIN, TIBC, IRON, RETICCTPCT,  in the last 72 hours  RADIOLOGY: Dg Chest 2 View  07/05/2014   CLINICAL DATA:  Diastolic heart failure, dyspnea and abdominal pain  EXAM: CHEST  2 VIEW  COMPARISON:  Prior chest x-ray 06/28/2014  FINDINGS: Stable position of right subclavian approach cardiac rhythm maintenance device with leads projecting over the right atrium and right ventricle. Unchanged globular cardiomegaly. Atherosclerotic calcifications present in the transverse aorta. Linear opacities in the left mid lung are similar compared to prior and may reflect atelectasis or scarring. Persistent  mild elevation of the left hemidiaphragm. Is no new focal airspace consolidation, pulmonary edema or pneumothorax. Stable central bronchitic changes and diffuse mild interstitial prominence. No acute osseous abnormality. The visualized upper abdominal bowel gas pattern is unremarkable.  IMPRESSION: Stable chest x-ray without evidence of acute process.   Electronically Signed   By: Jacqulynn Cadet M.D.   On: 07/05/2014 09:06   Dg Chest 2 View  06/28/2014   CLINICAL DATA:  Short of breath.  Chest pressure.  EXAM: CHEST  2 VIEW  COMPARISON:  /14/15  FINDINGS: Moderate enlargement cardiac silhouette. No mediastinal or hilar masses. No lung consolidation or edema. No pleural effusion or pneumothorax.  Right anterior chest wall sequential pacemaker is stable in well positioned.  Bony thorax is diffusely demineralized.  IMPRESSION: No acute findings.  Stable  appearance from the previous day's study.   Electronically Signed   By: Lajean Manes M.D.   On: 06/28/2014 11:32   Dg Chest 2 View  06/27/2014   CLINICAL DATA:  Right shoulder pain for 2 days.  EXAM: CHEST  2 VIEW  COMPARISON:  CT chest 04/25/2014. Single view of the chest 05/08/2014.  FINDINGS: Lung volumes are lower than on the comparison plain film of the chest with crowding of the bronchovascular structures. Heart size is enlarged but there is no pulmonary edema. No pneumothorax or pleural effusion.  IMPRESSION: Cardiomegaly without acute disease.   Electronically Signed   By: Inge Rise M.D.   On: 06/27/2014 19:19    ASSESSMENT AND PLAN:  Active Problems:   Acute diastolic heart failure   Acute on chronic systolic ACC/AHA stage C congestive heart failure  1. Acute on chronic diastolic CHF She appears acutely volume overloaded Continue IV lasix Though she may ultimately do better in sinus rhythm, we cannot pursue sinus until she is appropriately anticoagulated and stable, without bleeding/ worsening anemia  2. Concerns for GI bleeding/ anemia Concerns from [place] noted.  It does not appear that anyone has consulted GI yet I will place consult Holding anticoagulation as per office team at this time.  Will make decisions about anticoagulation once seen by GI  3. Acute on chronic renal failure Makes diuresis difficult Will follow closely  4. afib Rates are reasonably controlled Increase metoprolol to 25mg   Anticoagulation on hold Continue amiodarone  This patient is acutely ill but has chronic medical problems and will be in the hospital for > 2 days for IV diuresis and management of her multiple issues.  I will transfer to inpatient status.  I will consult [facility] for medical management of multiple issues.  She should probably return to their service (just discharged 8/17).     Thompson Grayer, MD 07/05/2014 11:10 AM

## 2014-07-05 NOTE — Progress Notes (Signed)
UR completed 

## 2014-07-05 NOTE — Consult Note (Addendum)
Referring Provider: No ref. provider found Primary Care Physician:  [name], MD Primary Gastroenterologist:  None, [place] unassigned  Reason for Consultation:  Anemia; melena; on Eliquis  HPI: [name] is a 80 y.o. female with the history of nonobstructive CAD by cardiac catheterization in 01/2014, persistent atrial fibrillation placed on Eliquis 05/8294, diastolic CHF, HTN, HL, CKD.  Patient was admitted 04/2014 x2 with acute on chronic diastolic CHF. She underwent cardioversion with restoration of NSR. This resulted in profound bradycardia and hypotension requiring pacemaker implantation. Pacemaker implant was complicated by acute blood loss anemia requiring transfusion with PRBCs. Her Eliquis was held for several days. She was discharged to SNF. Carvedilol was held secondary to hypotension.  Review of hospital records demonstrates that she was in NSR at d/c. She was seen in the emergency room 6/25 with chest discomfort. ECG at that time demonstrated atrial fibrillation. Has saw Richardson Dopp, [place] in July and then Dr. Harrington Challenger earlier this August. Difficult to say if she felt better in sinus - Dr. Harrington Challenger elected to leave her in AF with rate control and anticoagulation. Has been placed on amiodarone - more for rate control.   Most recently in the hospital and discharged this past Monday with shortness of breath, weight gain and chest pain. Saw a covering MD for Dr. Regis Bill 8/20. More heart failure symptoms. Poor rate control with her AF. She refused admission.   Went back 8/21 for evaluation with her daughter. She remained short of breath. 2 pillow orthopnea. Still with swelling. Anxious and air hungry at times.  Reported dark stool - has just started iron.  Some chest heaviness as well. She feels her heart racing.   She was admitted and GI is being consulted for anemia and black stools.  Iron was just started this past week.  She tells me that since her hospital discharge on Monday that  she had one large, dark and tarry stool.  Only very small amount of stool since then, however.  They have not been able to collect a sample to check for blood.  She was FOBT negative on 06/29/2014, however.  Hgb was 9.7 grams just one week ago and was 8.9 grams on 8/20, but up again today at 9.6 grams.  One month ago Hgb was 13.6 grams and it appears that up until that time that her Hgb was running in the 11-12 gram range.  She has never seen GI or undergone EGD and colonoscopy (at least not in over 30 years).  She denies abdominal pain.  Had some nausea last night but no vomiting.  Appetite is not great but had some breakfast and is eating lunch now.  Says that she's had a lot of issues with heartburn/indigestion in the past and used to take a lot of Tums.  Had some indigestion this AM with coffee.  No complaints of dysphagia.   Past Medical History  Diagnosis Date  . Dyslipidemia   . Hypertension   . Palpitations     PVCs/bigeminy on event in May 2009 revealing this was relatively asymptomatic  . Chest pain     a. 2011 Neg MV;  b. 01/2014 Cath: LM 20-30, LAD nl, D1 nl, LCX nl, OM1 nl, RCA dom 30-109m, PD/PL nl, EF 65%->Med Rx.  . Degenerative joint disease   . Hx of varicella   . History of skin cancer     tafeen/ non melanoma.   . Thrombocytopenia   . Anemia   . Monoclonal gammopathies   .  Right lower quadrant abdominal pain 07/19/2012    Recurrent  With nausea    This is new since she had ct for llq pain in [facility]    R/o appendiceal problem  Hernia  Get ct scan  And plan  Fu     . Diastolic CHF, acute on chronic 01/31/2012  . Atrial fibrillation     a. Dx 01/2014->Eliquis started.  . Cancer   . Pacemaker   . Dysrhythmia     Past Surgical History  Procedure Laterality Date  . Cholecystectomy  1999  . Cesarean section      times 2  . Shoulder surgery  1996    Right   . Cataract extraction      Bilateral  implantt  . Cardioversion N/A 02/26/2014    Procedure: CARDIOVERSION AT  BEDSIDE;  Surgeon: Pixie Casino, MD;  Location: [facility];  Service: Cardiovascular;  Laterality: N/A;  . Cardioversion N/A 04/22/2014    Procedure: CARDIOVERSION (BEDSIDE);  Surgeon: [name], MD;  Location: [facility] OR;  Service: Cardiovascular;  Laterality: N/A;  . Pacemaker insertion  04/23/2014    STJ Assurity dual chamber pacemaker implanted by Dr Rayann Heman  . Insert / replace / remove pacemaker      Prior to Admission medications   Medication Sig Start Date End Date Taking? Authorizing Provider  amiodarone (PACERONE) 200 MG tablet Take 1 tablet (200 mg total) by mouth 2 (two) times daily. BID for 2 weeks then 1 daily 06/30/14  Yes Thurnell Lose, MD  apixaban (ELIQUIS) 2.5 MG TABS tablet Take 1 tablet (2.5 mg total) by mouth 2 (two) times daily. 05/02/14  Yes Domenic Polite, MD  aspirin 81 MG tablet Take 81 mg by mouth daily. Pt has received conflicting instructions whether or not to continue ASA but did take day PTA   Yes Historical Provider, MD  CALCIUM PO Take 600 mg by mouth daily.    Yes Historical Provider, MD  ferrous sulfate 325 (65 FE) MG tablet Take 1 tablet (325 mg total) by mouth 2 (two) times daily with a meal. 06/30/14  Yes Thurnell Lose, MD  furosemide (LASIX) 20 MG tablet Take 20 mg by mouth 2 (two) times daily. Took additional dose yesterday 06/23/14  Yes Fay Records, MD  levothyroxine (SYNTHROID, LEVOTHROID) 75 MCG tablet Take 1 tablet (75 mcg total) by mouth daily. 01/16/14  Yes Fay Records, MD  loratadine (CLARITIN) 10 MG tablet Take 10 mg by mouth daily as needed for allergies.    Yes Historical Provider, MD  metoprolol tartrate (LOPRESSOR) 25 MG tablet Take 0.5 tablets (12.5 mg total) by mouth 2 (two) times daily. 06/11/14  Yes Scott T Kathlen Mody, PA-C  nitroGLYCERIN (NITROSTAT) 0.4 MG SL tablet Place 1 tablet (0.4 mg total) under the tongue every 5 (five) minutes as needed. For chest pain 12/02/13  Yes Fay Records, MD  simvastatin (ZOCOR) 10 MG tablet Take 1 tablet (10 mg  total) by mouth at bedtime. 05/29/14  Yes Liliane Shi, PA-C    Current Facility-Administered Medications  Medication Dose Route Frequency Provider Last Rate Last Dose  . 0.9 %  sodium chloride infusion  250 mL Intravenous PRN Burtis Junes, NP      . acetaminophen (TYLENOL) tablet 650 mg  650 mg Oral Q4H PRN Burtis Junes, NP   650 mg at 07/04/14 2111  . amiodarone (PACERONE) tablet 200 mg  200 mg Oral BID Burtis Junes, NP  200 mg at 07/05/14 1044  . ferrous sulfate tablet 325 mg  325 mg Oral BID WC Burtis Junes, NP   325 mg at 07/05/14 0655  . furosemide (LASIX) injection 40 mg  40 mg Intravenous Q12H Burtis Junes, NP   40 mg at 07/05/14 1044  . levothyroxine (SYNTHROID, LEVOTHROID) tablet 75 mcg  75 mcg Oral QAC breakfast Burtis Junes, NP   75 mcg at 07/05/14 [phone]  . loratadine (CLARITIN) tablet 10 mg  10 mg Oral Daily PRN Stephani Police, MD   10 mg at 07/04/14 2312  . metoprolol tartrate (LOPRESSOR) tablet 25 mg  25 mg Oral Q12H Thompson Grayer, MD      . ondansetron [facility]) injection 4 mg  4 mg Intravenous Q8H PRN Candee Furbish, MD   4 mg at 07/04/14 1846   And  . ondansetron (ZOFRAN) tablet 4 mg  4 mg Oral Q8H PRN Candee Furbish, MD      . simvastatin (ZOCOR) tablet 10 mg  10 mg Oral q1800 Burtis Junes, NP   10 mg at 07/04/14 1723  . sodium chloride 0.9 % injection 3 mL  3 mL Intravenous Q12H Burtis Junes, NP   3 mL at 07/05/14 1044  . sodium chloride 0.9 % injection 3 mL  3 mL Intravenous PRN Burtis Junes, NP        Allergies as of 07/04/2014  . (No Known Allergies)    Family History  Problem Relation Age of Onset  . Coronary artery disease Father   . Heart disease Father   . Lung cancer    . Alzheimer's disease Mother   . Cancer Brother 88    lung cancer  . Heart attack Father     History   Social History  . Marital Status: Widowed    Spouse Name: N/A    Number of Children: 3  . Years of Education: N/A   Occupational History  .      [employer], book keeping   Social History Main Topics  . Smoking status: Never Smoker   . Smokeless tobacco: Never Used  . Alcohol Use: No  . Drug Use: No  . Sexual Activity: Not Currently   Other Topics Concern  . 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 Eats with her.    Review of Systems: Ten point ROS is O/W negative except as mentioned in HPI.  Physical Exam: Vital signs in last 24 hours: Temp:  [97.7 F (36.5 C)-98 F (36.7 C)] 98 F (36.7 C) (08/22 1042) Pulse Rate:  [72-93] 76 (08/22 1042) Resp:  [16-18] 16 (08/22 1042) BP: (101-148)/(53-64) 105/58 mmHg (08/22 1042) SpO2:  [95 %-100 %] 95 % (08/22 1042) Weight:  [155 lb (70.308 kg)-156 lb 15.5 oz (71.2 kg)] 156 lb 15.5 oz (71.2 kg) (08/22 0530) Last BM Date: 07/03/14  General:   Alert, Well-developed, well-nourished, pleasant and cooperative in NAD Head:  Normocephalic and atraumatic. Eyes:  Sclera clear, no icterus.  Conjunctiva pink. Ears:  Normal auditory acuity. Mouth:  No deformity or lesions.   Lungs:  Clear throughout to auscultation.  No wheezes,  crackles, or rhonchi.  Heart:  Irregular. Abdomen:  Soft, non-distended.  BS present.  Non-tender.   Rectal:  Deferred  Msk:  Symmetrical without gross deformities. Pulses:  Normal pulses noted. Extremities:  Mild edema in B/L LE's. Neurologic:  Alert and  oriented x4;  grossly normal neurologically. Skin:  Intact without significant lesions or rashes. Psych:  Alert and cooperative. Normal mood and affect.  Intake/Output from previous day: 08/21 0701 - 08/22 0700 In: 600 [P.O.:600] Out: 675 [Urine:675] Intake/Output this shift: Total I/O In: 363 [P.O.:360; I.V.:3] Out: -   Lab Results:  Recent Labs  07/03/14 1524  07/04/14 1349  WBC  --  5.7  HGB 8.9* 9.6*  HCT  --  31.3*  PLT  --  217   BMET  Recent Labs  07/03/14 1516 07/04/14 1349 07/05/14 0147  NA 135 141 140  K 4.9 4.5 4.0  CL 100 100 102  CO2 29 28 24   GLUCOSE 93 117* 106*  BUN 25* 24* 23  CREATININE 1.5* 1.49* 1.55*  CALCIUM 9.5 9.9 9.0   LFT  Recent Labs  07/04/14 1349  PROT 7.0  ALBUMIN 3.2*  AST 14  ALT 10  ALKPHOS 99  BILITOT 0.6   PT/INR  Recent Labs  07/04/14 1349  LABPROT 18.4*  INR 1.53*   Studies/Results: Dg Chest 2 View  07/05/2014   CLINICAL DATA:  Diastolic heart failure, dyspnea and abdominal pain  EXAM: CHEST  2 VIEW  COMPARISON:  Prior chest x-ray 06/28/2014  FINDINGS: Stable position of right subclavian approach cardiac rhythm maintenance device with leads projecting over the right atrium and right ventricle. Unchanged globular cardiomegaly. Atherosclerotic calcifications present in the transverse aorta. Linear opacities in the left mid lung are similar compared to prior and may reflect atelectasis or scarring. Persistent mild elevation of the left hemidiaphragm. Is no new focal airspace consolidation, pulmonary edema or pneumothorax. Stable central bronchitic changes and diffuse mild interstitial prominence. No acute osseous abnormality. The visualized upper abdominal bowel gas pattern is unremarkable.  IMPRESSION: Stable chest x-ray without evidence of acute process.   Electronically Signed   By: Jacqulynn Cadet M.D.   On: 07/05/2014 09:06    IMPRESSION:  -Anemia, Fe deficiency and GI bleeding, subacute with complaints of black stools in the setting of Eliquis.  Recently started on iron just this week.  Rule out ulcer disease, erosive esophagitis, AVM, etc.  Hgb is stable.  -Atrial fibrillation:  On Eliquis since 01/2014, but currently on hold. -CHF/CAD -CKD:  Fairly stable.  PLAN: -Monitor Hgb and transfuse if needed. -Fe replacement -Plan for EGD 8/23.   ZEHR, JESSICA D.  07/05/2014, 11:40  AM  Pager number [phone]     Attending physician's note   I have taken a history, examined the patient and reviewed the chart. I agree with the Advanced Practitioner's note, impression and recommendations. Fe deficiency anemia with history of melena earlier this week in setting of Eliquis therapy. Started Fe recently with darker stools and mild constipation noted. Pacemaker implant in June was complicated by bleeding that required transfusion. Hemoccults from 8/16 were negative. Acute on chronic CHF with volume overload and mild SOB this admission. Proceed with EGD given history of melena tomorrow. Eliquis on hold since Friday. Start Miralax for constipation. The risks, benefits, and alternatives to endoscopy with possible biopsy and possible dilation were discussed with the patient and they consent to proceed.    Ladene Artist, MD Marval Regal

## 2014-07-06 ENCOUNTER — Encounter (HOSPITAL_COMMUNITY): Payer: Self-pay

## 2014-07-06 ENCOUNTER — Encounter (HOSPITAL_COMMUNITY)
Admission: AD | Disposition: A | Discharge status: Home Health | Payer: Self-pay | Source: Ambulatory Visit | Attending: Internal Medicine

## 2014-07-06 DIAGNOSIS — N179 Acute kidney failure, unspecified: Secondary | ICD-10-CM

## 2014-07-06 DIAGNOSIS — N189 Chronic kidney disease, unspecified: Secondary | ICD-10-CM

## 2014-07-06 DIAGNOSIS — K2960 Other gastritis without bleeding: Secondary | ICD-10-CM | POA: Diagnosis present

## 2014-07-06 HISTORY — PX: ESOPHAGOGASTRODUODENOSCOPY: SHX5428

## 2014-07-06 LAB — BASIC METABOLIC PANEL
Anion gap: 11 (ref 5–15)
BUN: 25 mg/dL — ABNORMAL HIGH (ref 6–23)
CO2: 29 mEq/L (ref 19–32)
Calcium: 9 mg/dL (ref 8.4–10.5)
Chloride: 98 mEq/L (ref 96–112)
Creatinine, Ser: 1.73 mg/dL — ABNORMAL HIGH (ref 0.50–1.10)
GFR calc Af Amer: 29 mL/min — ABNORMAL LOW (ref >90)
GFR calc non Af Amer: 25 mL/min — ABNORMAL LOW (ref >90)
Glucose, Bld: 103 mg/dL — ABNORMAL HIGH (ref 70–99)
Potassium: 3.7 mEq/L (ref 3.7–5.3)
Sodium: 138 mEq/L (ref 137–147)

## 2014-07-06 LAB — CBC
HEMATOCRIT: 28.9 % — AB (ref 36.0–46.0)
Hemoglobin: 9.1 g/dL — ABNORMAL LOW (ref 12.0–15.0)
MCH: 26.9 pg (ref 26.0–34.0)
MCHC: 31.5 g/dL (ref 30.0–36.0)
MCV: 85.5 fL (ref 78.0–100.0)
Platelets: 201 10*3/uL (ref 150–400)
RBC: 3.38 MIL/uL — ABNORMAL LOW (ref 3.87–5.11)
RDW: 16 % — AB (ref 11.5–15.5)
WBC: 5.9 10*3/uL (ref 4.0–10.5)

## 2014-07-06 LAB — OCCULT BLOOD X 1 CARD TO LAB, STOOL: Fecal Occult Bld: NEGATIVE

## 2014-07-06 SURGERY — EGD (ESOPHAGOGASTRODUODENOSCOPY)
Anesthesia: Moderate Sedation

## 2014-07-06 MED ORDER — PANTOPRAZOLE SODIUM 40 MG PO TBEC
40.0000 mg | DELAYED_RELEASE_TABLET | Freq: Every day | ORAL | Status: DC
  Administered 2014-07-06 – 2014-07-13 (×8): 40 mg via ORAL
  Filled 2014-07-06 (×7): qty 1

## 2014-07-06 MED ORDER — FENTANYL CITRATE 0.05 MG/ML IJ SOLN
INTRAMUSCULAR | Status: AC
  Filled 2014-07-06: qty 2

## 2014-07-06 MED ORDER — MIDAZOLAM HCL 5 MG/ML IJ SOLN
INTRAMUSCULAR | Status: AC
  Filled 2014-07-06: qty 2

## 2014-07-06 MED ORDER — MIDAZOLAM HCL 10 MG/2ML IJ SOLN
INTRAMUSCULAR | Status: DC | PRN
  Administered 2014-07-06: 2 mg via INTRAVENOUS
  Administered 2014-07-06: 1 mg via INTRAVENOUS

## 2014-07-06 MED ORDER — BUTAMBEN-TETRACAINE-BENZOCAINE 2-2-14 % EX AERO
INHALATION_SPRAY | CUTANEOUS | Status: DC | PRN
  Administered 2014-07-06: 2 via TOPICAL

## 2014-07-06 MED ORDER — FENTANYL CITRATE 0.05 MG/ML IJ SOLN
INTRAMUSCULAR | Status: DC | PRN
  Administered 2014-07-06: 25 ug via INTRAVENOUS

## 2014-07-06 NOTE — Op Note (Signed)
[facility] [place], [id number]   ENDOSCOPY PROCEDURE REPORT  PATIENT: [name], [name]  MR#: [id number] BIRTHDATE: 1925-09-08 , 68  yrs. old GENDER: Female ENDOSCOPIST: Ladene Artist, MD, [facility] REFERRED BY:  [facility] PROCEDURE DATE:  07/06/2014 PROCEDURE:  EGD w/ biopsy ASA CLASS:     Class III INDICATIONS:  Melena.   Iron deficiency anemia. MEDICATIONS: These medications were titrated to patient response per physician's verbal order, Fentanyl 25 mcg IV, and Versed 3 mg IV TOPICAL ANESTHETIC: Cetacaine Spray DESCRIPTION OF PROCEDURE: After the risks benefits and alternatives of the procedure were thoroughly explained, informed consent was obtained.  The Pentax Gastroscope [id number] endoscope was introduced through the mouth and advanced to the second portion of the duodenum. Without limitations.  The instrument was slowly withdrawn as the mucosa was fully examined.    STOMACH: Mild non erosive gastritis was found in the gastric antrum and gastric body.  Multiple biopsies were performed.   The stomach otherwise appeared normal. ESOPHAGUS: The mucosa of the esophagus appeared normal. DUODENUM: The duodenal mucosa showed no abnormalities.  Retroflexed views revealed a small hiatal hernia.     The scope was then withdrawn from the patient and the procedure completed.  COMPLICATIONS: There were no complications.  ENDOSCOPIC IMPRESSION: 1.   Gastritis in the antrum and body; multiple biopsies 2.   Small hiatal hernia  RECOMMENDATIONS: 1.  Await pathology results 2.  Continue PPI 3.  Colonoscopy as inpatient when CHF improves and before resuming anticoagulation  eSigned:  Ladene Artist, MD, [facility] 07/06/2014 8:21 AM

## 2014-07-06 NOTE — Progress Notes (Signed)
SUBJECTIVE: The patient is doing well today.  She is recovering s/p EGD.  At this time, she denies chest pain, shortness of breath, or any new concerns.  Marland Kitchen amiodarone  200 mg Oral BID  . docusate sodium  100 mg Oral BID  . furosemide  40 mg Intravenous Q12H  . levothyroxine  75 mcg Oral QAC breakfast  . metoprolol tartrate  25 mg Oral Q12H  . pantoprazole  40 mg Oral Q0600  . polyethylene glycol  17 g Oral BID  . senna  2 tablet Oral Daily  . simvastatin  10 mg Oral q1800  . sodium chloride  3 mL Intravenous Q12H   . sodium chloride 500 mL (07/06/14 0823)    OBJECTIVE: Physical Exam: Filed Vitals:   07/06/14 0820 07/06/14 0830 07/06/14 0856 07/06/14 0857  BP:  93/50 104/64   Pulse: 81 70 70   Temp:      TempSrc:      Resp: 16 19 14    Height:      Weight:      SpO2: 100% 95% 88% 96%    Intake/Output Summary (Last 24 hours) at 07/06/14 1058 Last data filed at 07/06/14 0824  Gross per 24 hour  Intake    760 ml  Output   1200 ml  Net   -440 ml    Telemetry reveals afib  GEN- The patient is elderly appearing, alert and oriented x 3 today.   Head- normocephalic, atraumatic Eyes-  Sclera clear, conjunctiva pink Ears- hearing intact Oropharynx- clear with dry MM Neck- supple, JVP 6 cm Lungs- Clear to ausculation bilaterally, normal work of breathing Heart- irregular rate and rhythm  GI- soft, NT, ND, + BS Extremities- no clubbing, cyanosis, or edema Skin- no rash or lesion Psych- euthymic mood, full affect Neuro- strength and sensation are intact  LABS: Basic Metabolic Panel:  Recent Labs  07/04/14 1349 07/05/14 0147 07/06/14 0430  NA 141 140 138  K 4.5 4.0 3.7  CL 100 102 98  CO2 28 24 29   GLUCOSE 117* 106* 103*  BUN 24* 23 25*  CREATININE 1.49* 1.55* 1.73*  CALCIUM 9.9 9.0 9.0  MG 2.2  --   --    Liver Function Tests:  Recent Labs  07/04/14 1349  AST 14  ALT 10  ALKPHOS 99  BILITOT 0.6  PROT 7.0  ALBUMIN 3.2*   No results found for  this basename: LIPASE, AMYLASE,  in the last 72 hours CBC:  Recent Labs  07/04/14 1349 07/06/14 0430  WBC 5.7 5.9  NEUTROABS 4.3  --   HGB 9.6* 9.1*  HCT 31.3* 28.9*  MCV 87.2 85.5  PLT 217 201   Cardiac Enzymes:  Recent Labs  07/04/14 1349 07/04/14 2016 07/05/14 0147  TROPONINI <0.30 <0.30 <0.30   Thyroid Function Tests:  Recent Labs  07/04/14 1349  TSH 2.760   Anemia Panel: No results found for this basename: VITAMINB12, FOLATE, FERRITIN, TIBC, IRON, RETICCTPCT,  in the last 72 hours  RADIOLOGY: Dg Chest 2 View  07/05/2014   CLINICAL DATA:  Diastolic heart failure, dyspnea and abdominal pain  EXAM: CHEST  2 VIEW  COMPARISON:  Prior chest x-ray 06/28/2014  FINDINGS: Stable position of right subclavian approach cardiac rhythm maintenance device with leads projecting over the right atrium and right ventricle. Unchanged globular cardiomegaly. Atherosclerotic calcifications present in the transverse aorta. Linear opacities in the left mid lung are similar compared to prior and may reflect atelectasis or scarring. Persistent  mild elevation of the left hemidiaphragm. Is no new focal airspace consolidation, pulmonary edema or pneumothorax. Stable central bronchitic changes and diffuse mild interstitial prominence. No acute osseous abnormality. The visualized upper abdominal bowel gas pattern is unremarkable.  IMPRESSION: Stable chest x-ray without evidence of acute process.   Electronically Signed   By: Jacqulynn Cadet M.D.   On: 07/05/2014 09:06   Dg Chest 2 View  06/28/2014   CLINICAL DATA:  Short of breath.  Chest pressure.  EXAM: CHEST  2 VIEW  COMPARISON:  /14/15  FINDINGS: Moderate enlargement cardiac silhouette. No mediastinal or hilar masses. No lung consolidation or edema. No pleural effusion or pneumothorax.  Right anterior chest wall sequential pacemaker is stable in well positioned.  Bony thorax is diffusely demineralized.  IMPRESSION: No acute findings.  Stable  appearance from the previous day's study.   Electronically Signed   By: Lajean Manes M.D.   On: 06/28/2014 11:32   Dg Chest 2 View  06/27/2014   CLINICAL DATA:  Right shoulder pain for 2 days.  EXAM: CHEST  2 VIEW  COMPARISON:  CT chest 04/25/2014. Single view of the chest 05/08/2014.  FINDINGS: Lung volumes are lower than on the comparison plain film of the chest with crowding of the bronchovascular structures. Heart size is enlarged but there is no pulmonary edema. No pneumothorax or pleural effusion.  IMPRESSION: Cardiomegaly without acute disease.   Electronically Signed   By: Inge Rise M.D.   On: 06/27/2014 19:19   Dg Lumbar Spine 2-3 Views  07/05/2014   CLINICAL DATA:  Back pain; known scoliosis  EXAM: LUMBAR SPINE - 2-3 VIEW  COMPARISON:  Coronal and sagittal images from and abdominal pelvic CT scan of July 20, 2012.  FINDINGS: There is moderate mid thoracic scoliosis with a rotatory component. There is mild disc space narrowing at all lumbar levels. There is no acute compression fracture. The observed portions of the sacrum are grossly normal. There is dense calcification in the wall of the abdominal aorta.  IMPRESSION: There is levoscoliosis of the lumbar spine with moderate degenerative disc and facet joint change.   Electronically Signed   By: David  [place]   On: 07/05/2014 19:38    ASSESSMENT AND PLAN:  Active Problems:   Acute diastolic heart failure   Acute on chronic systolic ACC/AHA stage C congestive heart failure   Melena   Anemia, iron deficiency   CKD (chronic kidney disease) stage 3, GFR 30-59 ml/min   Other specified gastritis without mention of hemorrhage  1. Acute on chronic diastolic CHF  I think that she is probably optivolemic Though she may ultimately do better in sinus rhythm, we cannot pursue sinus until she is appropriately anticoagulated and stable, without bleeding/ worsening anemia   2. Concerns for GI bleeding/ anemia  Appreciate GI input.  Would  proceed with colonoscopy without any additional cardiology workup if medically indicated  3. Acute on chronic renal failure  Makes diuresis difficult  Will follow closely   4. afib  Rates are reasonably controlled  Continue metoprolol to 25mg   Anticoagulation on hold  Continue amiodarone   Appreciate assistance by [facility] and GI services  This patient is chronically ill with multiple medical issues ongoing.  Given her advanced age, her prognosis is poor.    Thompson Grayer, MD 07/06/2014 10:58 AM

## 2014-07-06 NOTE — Interval H&P Note (Signed)
History and Physical Interval Note:  07/06/2014 8:05 AM  [name]  has presented today for surgery, with the diagnosis of Black stool and anemia  The various methods of treatment have been discussed with the patient and family. After consideration of risks, benefits and other options for treatment, the patient has consented to  Procedure(s): ESOPHAGOGASTRODUODENOSCOPY (EGD) (N/A) as a surgical intervention .  The patient's history has been reviewed, patient examined, no change in status, stable for surgery.  I have reviewed the patient's chart and labs.  Questions were answered to the patient's satisfaction.     Pricilla Riffle. Fuller Plan MD

## 2014-07-06 NOTE — Progress Notes (Signed)
Pt returned from endo somewhat sleepy. Sats88% room air. 2L [place] applied

## 2014-07-06 NOTE — Progress Notes (Signed)
Patient is alert and oriented x 4. Patient was complaining of right abdominal pain at the start of the shift, and rated it a 5/10. Patient medicated with 650 mg of Tylenol as per MD order, and reports that the Tylenol has been effective. She rates her pain a 1/10 at present time. Will continue to monitor.  Esperanza Heir, RN

## 2014-07-06 NOTE — H&P (View-Only) (Signed)
Referring Provider: No ref. provider found Primary Care Physician:  [name], MD Primary Gastroenterologist:  None, [place] unassigned  Reason for Consultation:  Anemia; melena; on Eliquis  HPI: [name] is a 80 y.o. female with the history of nonobstructive CAD by cardiac catheterization in 01/2014, persistent atrial fibrillation placed on Eliquis 02/101, diastolic CHF, HTN, HL, CKD.  Patient was admitted 04/2014 x2 with acute on chronic diastolic CHF. She underwent cardioversion with restoration of NSR. This resulted in profound bradycardia and hypotension requiring pacemaker implantation. Pacemaker implant was complicated by acute blood loss anemia requiring transfusion with PRBCs. Her Eliquis was held for several days. She was discharged to SNF. Carvedilol was held secondary to hypotension.  Review of hospital records demonstrates that she was in NSR at d/c. She was seen in the emergency room 6/25 with chest discomfort. ECG at that time demonstrated atrial fibrillation. Has saw Richardson Dopp, [place] in July and then Dr. Harrington Challenger earlier this August. Difficult to say if she felt better in sinus - Dr. Harrington Challenger elected to leave her in AF with rate control and anticoagulation. Has been placed on amiodarone - more for rate control.   Most recently in the hospital and discharged this past Monday with shortness of breath, weight gain and chest pain. Saw a covering MD for Dr. Regis Bill 8/20. More heart failure symptoms. Poor rate control with her AF. She refused admission.   Went back 8/21 for evaluation with her daughter. She remained short of breath. 2 pillow orthopnea. Still with swelling. Anxious and air hungry at times.  Reported dark stool - has just started iron.  Some chest heaviness as well. She feels her heart racing.   She was admitted and GI is being consulted for anemia and black stools.  Iron was just started this past week.  She tells me that since her hospital discharge on Monday that  she had one large, dark and tarry stool.  Only very small amount of stool since then, however.  They have not been able to collect a sample to check for blood.  She was FOBT negative on 06/29/2014, however.  Hgb was 9.7 grams just one week ago and was 8.9 grams on 8/20, but up again today at 9.6 grams.  One month ago Hgb was 13.6 grams and it appears that up until that time that her Hgb was running in the 11-12 gram range.  She has never seen GI or undergone EGD and colonoscopy (at least not in over 30 years).  She denies abdominal pain.  Had some nausea last night but no vomiting.  Appetite is not great but had some breakfast and is eating lunch now.  Says that she's had a lot of issues with heartburn/indigestion in the past and used to take a lot of Tums.  Had some indigestion this AM with coffee.  No complaints of dysphagia.   Past Medical History  Diagnosis Date  . Dyslipidemia   . Hypertension   . Palpitations     PVCs/bigeminy on event in May 2009 revealing this was relatively asymptomatic  . Chest pain     a. 2011 Neg MV;  b. 01/2014 Cath: LM 20-30, LAD nl, D1 nl, LCX nl, OM1 nl, RCA dom 30-34m, PD/PL nl, EF 65%->Med Rx.  . Degenerative joint disease   . Hx of varicella   . History of skin cancer     tafeen/ non melanoma.   . Thrombocytopenia   . Anemia   . Monoclonal gammopathies   .  Right lower quadrant abdominal pain 07/19/2012    Recurrent  With nausea    This is new since she had ct for llq pain in [facility]    R/o appendiceal problem  Hernia  Get ct scan  And plan  Fu     . Diastolic CHF, acute on chronic 01/31/2012  . Atrial fibrillation     a. Dx 01/2014->Eliquis started.  . Cancer   . Pacemaker   . Dysrhythmia     Past Surgical History  Procedure Laterality Date  . Cholecystectomy  1999  . Cesarean section      times 2  . Shoulder surgery  1996    Right   . Cataract extraction      Bilateral  implantt  . Cardioversion N/A 02/26/2014    Procedure: CARDIOVERSION AT  BEDSIDE;  Surgeon: Pixie Casino, MD;  Location: [facility];  Service: Cardiovascular;  Laterality: N/A;  . Cardioversion N/A 04/22/2014    Procedure: CARDIOVERSION (BEDSIDE);  Surgeon: [name], MD;  Location: [facility];  Service: Cardiovascular;  Laterality: N/A;  . Pacemaker insertion  04/23/2014    STJ Assurity dual chamber pacemaker implanted by Dr Rayann Heman  . Insert / replace / remove pacemaker      Prior to Admission medications   Medication Sig Start Date End Date Taking? Authorizing Provider  amiodarone (PACERONE) 200 MG tablet Take 1 tablet (200 mg total) by mouth 2 (two) times daily. BID for 2 weeks then 1 daily 06/30/14  Yes Thurnell Lose, MD  apixaban (ELIQUIS) 2.5 MG TABS tablet Take 1 tablet (2.5 mg total) by mouth 2 (two) times daily. 05/02/14  Yes Domenic Polite, MD  aspirin 81 MG tablet Take 81 mg by mouth daily. Pt has received conflicting instructions whether or not to continue ASA but did take day PTA   Yes Historical Provider, MD  CALCIUM PO Take 600 mg by mouth daily.    Yes Historical Provider, MD  ferrous sulfate 325 (65 FE) MG tablet Take 1 tablet (325 mg total) by mouth 2 (two) times daily with a meal. 06/30/14  Yes Thurnell Lose, MD  furosemide (LASIX) 20 MG tablet Take 20 mg by mouth 2 (two) times daily. Took additional dose yesterday 06/23/14  Yes Fay Records, MD  levothyroxine (SYNTHROID, LEVOTHROID) 75 MCG tablet Take 1 tablet (75 mcg total) by mouth daily. 01/16/14  Yes Fay Records, MD  loratadine (CLARITIN) 10 MG tablet Take 10 mg by mouth daily as needed for allergies.    Yes Historical Provider, MD  metoprolol tartrate (LOPRESSOR) 25 MG tablet Take 0.5 tablets (12.5 mg total) by mouth 2 (two) times daily. 06/11/14  Yes Scott T Kathlen Mody, PA-C  nitroGLYCERIN (NITROSTAT) 0.4 MG SL tablet Place 1 tablet (0.4 mg total) under the tongue every 5 (five) minutes as needed. For chest pain 12/02/13  Yes Fay Records, MD  simvastatin (ZOCOR) 10 MG tablet Take 1 tablet (10 mg  total) by mouth at bedtime. 05/29/14  Yes Liliane Shi, PA-C    Current Facility-Administered Medications  Medication Dose Route Frequency Provider Last Rate Last Dose  . 0.9 %  sodium chloride infusion  250 mL Intravenous PRN Burtis Junes, NP      . acetaminophen (TYLENOL) tablet 650 mg  650 mg Oral Q4H PRN Burtis Junes, NP   650 mg at 07/04/14 2111  . amiodarone (PACERONE) tablet 200 mg  200 mg Oral BID Burtis Junes, NP  200 mg at 07/05/14 1044  . ferrous sulfate tablet 325 mg  325 mg Oral BID WC Burtis Junes, NP   325 mg at 07/05/14 0655  . furosemide (LASIX) injection 40 mg  40 mg Intravenous Q12H Burtis Junes, NP   40 mg at 07/05/14 1044  . levothyroxine (SYNTHROID, LEVOTHROID) tablet 75 mcg  75 mcg Oral QAC breakfast Burtis Junes, NP   75 mcg at 07/05/14 [phone]  . loratadine (CLARITIN) tablet 10 mg  10 mg Oral Daily PRN Stephani Police, MD   10 mg at 07/04/14 2312  . metoprolol tartrate (LOPRESSOR) tablet 25 mg  25 mg Oral Q12H Thompson Grayer, MD      . ondansetron [facility]) injection 4 mg  4 mg Intravenous Q8H PRN Candee Furbish, MD   4 mg at 07/04/14 1846   And  . ondansetron (ZOFRAN) tablet 4 mg  4 mg Oral Q8H PRN Candee Furbish, MD      . simvastatin (ZOCOR) tablet 10 mg  10 mg Oral q1800 Burtis Junes, NP   10 mg at 07/04/14 1723  . sodium chloride 0.9 % injection 3 mL  3 mL Intravenous Q12H Burtis Junes, NP   3 mL at 07/05/14 1044  . sodium chloride 0.9 % injection 3 mL  3 mL Intravenous PRN Burtis Junes, NP        Allergies as of 07/04/2014  . (No Known Allergies)    Family History  Problem Relation Age of Onset  . Coronary artery disease Father   . Heart disease Father   . Lung cancer    . Alzheimer's disease Mother   . Cancer Brother 3    lung cancer  . Heart attack Father     History   Social History  . Marital Status: Widowed    Spouse Name: N/A    Number of Children: 3  . Years of Education: N/A   Occupational History  .      [employer], book keeping   Social History Main Topics  . Smoking status: Never Smoker   . Smokeless tobacco: Never Used  . Alcohol Use: No  . Drug Use: No  . Sexual Activity: Not Currently   Other Topics Concern  . 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 Eats with her.    Review of Systems: Ten point ROS is O/W negative except as mentioned in HPI.  Physical Exam: Vital signs in last 24 hours: Temp:  [97.7 F (36.5 C)-98 F (36.7 C)] 98 F (36.7 C) (08/22 1042) Pulse Rate:  [72-93] 76 (08/22 1042) Resp:  [16-18] 16 (08/22 1042) BP: (101-148)/(53-64) 105/58 mmHg (08/22 1042) SpO2:  [95 %-100 %] 95 % (08/22 1042) Weight:  [155 lb (70.308 kg)-156 lb 15.5 oz (71.2 kg)] 156 lb 15.5 oz (71.2 kg) (08/22 0530) Last BM Date: 07/03/14  General:   Alert, Well-developed, well-nourished, pleasant and cooperative in NAD Head:  Normocephalic and atraumatic. Eyes:  Sclera clear, no icterus.  Conjunctiva pink. Ears:  Normal auditory acuity. Mouth:  No deformity or lesions.   Lungs:  Clear throughout to auscultation.  No wheezes,  crackles, or rhonchi.  Heart:  Irregular. Abdomen:  Soft, non-distended.  BS present.  Non-tender.   Rectal:  Deferred  Msk:  Symmetrical without gross deformities. Pulses:  Normal pulses noted. Extremities:  Mild edema in B/L LE's. Neurologic:  Alert and  oriented x4;  grossly normal neurologically. Skin:  Intact without significant lesions or rashes. Psych:  Alert and cooperative. Normal mood and affect.  Intake/Output from previous day: 08/21 0701 - 08/22 0700 In: 600 [P.O.:600] Out: 675 [Urine:675] Intake/Output this shift: Total I/O In: 363 [P.O.:360; I.V.:3] Out: -   Lab Results:  Recent Labs  07/03/14 1524  07/04/14 1349  WBC  --  5.7  HGB 8.9* 9.6*  HCT  --  31.3*  PLT  --  217   BMET  Recent Labs  07/03/14 1516 07/04/14 1349 07/05/14 0147  NA 135 141 140  K 4.9 4.5 4.0  CL 100 100 102  CO2 29 28 24   GLUCOSE 93 117* 106*  BUN 25* 24* 23  CREATININE 1.5* 1.49* 1.55*  CALCIUM 9.5 9.9 9.0   LFT  Recent Labs  07/04/14 1349  PROT 7.0  ALBUMIN 3.2*  AST 14  ALT 10  ALKPHOS 99  BILITOT 0.6   PT/INR  Recent Labs  07/04/14 1349  LABPROT 18.4*  INR 1.53*   Studies/Results: Dg Chest 2 View  07/05/2014   CLINICAL DATA:  Diastolic heart failure, dyspnea and abdominal pain  EXAM: CHEST  2 VIEW  COMPARISON:  Prior chest x-ray 06/28/2014  FINDINGS: Stable position of right subclavian approach cardiac rhythm maintenance device with leads projecting over the right atrium and right ventricle. Unchanged globular cardiomegaly. Atherosclerotic calcifications present in the transverse aorta. Linear opacities in the left mid lung are similar compared to prior and may reflect atelectasis or scarring. Persistent mild elevation of the left hemidiaphragm. Is no new focal airspace consolidation, pulmonary edema or pneumothorax. Stable central bronchitic changes and diffuse mild interstitial prominence. No acute osseous abnormality. The visualized upper abdominal bowel gas pattern is unremarkable.  IMPRESSION: Stable chest x-ray without evidence of acute process.   Electronically Signed   By: Jacqulynn Cadet M.D.   On: 07/05/2014 09:06    IMPRESSION:  -Anemia, Fe deficiency and GI bleeding, subacute with complaints of black stools in the setting of Eliquis.  Recently started on iron just this week.  Rule out ulcer disease, erosive esophagitis, AVM, etc.  Hgb is stable.  -Atrial fibrillation:  On Eliquis since 01/2014, but currently on hold. -CHF/CAD -CKD:  Fairly stable.  PLAN: -Monitor Hgb and transfuse if needed. -Fe replacement -Plan for EGD 8/23.   ZEHR, JESSICA D.  07/05/2014, 11:40  AM  Pager number [phone]     Attending physician's note   I have taken a history, examined the patient and reviewed the chart. I agree with the Advanced Practitioner's note, impression and recommendations. Fe deficiency anemia with history of melena earlier this week in setting of Eliquis therapy. Started Fe recently with darker stools and mild constipation noted. Pacemaker implant in June was complicated by bleeding that required transfusion. Hemoccults from 8/16 were negative. Acute on chronic CHF with volume overload and mild SOB this admission. Proceed with EGD given history of melena tomorrow. Eliquis on hold since Friday. Start Miralax for constipation. The risks, benefits, and alternatives to endoscopy with possible biopsy and possible dilation were discussed with the patient and they consent to proceed.    Ladene Artist, MD Mar[facility] Regal

## 2014-07-06 NOTE — Progress Notes (Signed)
PROGRESS NOTE    [name] FTD:[id number] DOB: 1925-10-26 DOA: 07/04/2014 PCP: [name], MD  HPI/Brief narrative 80 year old female with nonobstructive CAD, diastolic CHF, permanent atrial fibrillation, CKD stage III, hyperlipidemia, hypothyroidism, and iron deficiency anemia was admitted on 07/04/2014 because of shortness of breath, orthopnea, and leg edema. She was admitted to the cardiology service for acute on chronic diastolic CHF. The patient was started on intravenous furosemide 40 mg IV every 12 hours. She has had a good clinical response. Dr. Rayann Heman has requested the hospitalist service assume care of patient due to patient's multiple co-morbidities.   Notably, the patient was recently discharged from the hospital on 06/30/2014 acute on chronic CHF. Her weight at the time of discharge was 69.9 kg. The patient was seen in her primary care provider's office on 07/03/2014 and refused admission. After being seen by her cardiologist on 07/04/2014, the patient agreed for admission.Patient was admitted 04/2014 x2 with acute on chronic diastolic CHF. She underwent cardioversion with restoration of NSR. This resulted in profound bradycardia and hypotension requiring pacemaker implantation. Pacemaker implant was complicated by acute blood loss anemia requiring transfusion with PRBCs. Her Eliquis was held for several days. She was discharged to SNF. Carvedilol was held secondary to hypotension. The apixiban has been since restarted. However, the patient reported melanotic stools prior to admission. She denies any hematochezia or hematemesis. She states that she was recently started on iron supplementation.    Assessment/Plan:  Iron deficiency anemia/melanotic stool  -The patient's hemoglobin has gradually dropped over the past 2 months  -Although she has had a hemoglobin of 13.6 on 05/08/2014, the overall trend of her hemoglobin over the past 3 months has been 11-12  -Iron  saturation 3% with low ferritin--06/27/2014  -continue iron supplementation  - EGD 8/23 showed gastritis> FU Bx. Continue PPI. In pt colonoscopy per GI.  acute on chronic diastolic CHF  -05/07/7627 after she was EF 60-65%,  - treated with IV furosemide per cardiology  -Discharge weight on 06/29/2014--69.9 kg  - now appears euvolemic & Lasix DC'ed  atrial fibrillation  -Continue metoprolol tartrate and amiodarone  -Rate controlled  -Apixiban presently on hold pending GI evaluation   hypothyroidism  -Continue Synthroid  -TSH 2.760   Hyperlipidemia  -Continue statin   AoCKD stage III  -Since early July, baseline creatinine has been 1.2-1.5  -Prior to June 2015--baseline creatinine 0.9-1.2  -Continue to monitor with diuresis   Low back pain  -UA neg -lumbar xray- no acute findings.    Code Status: Full Family Communication: None at bedside Disposition Plan: Home when medically stable   Consultants:  Cardiology  GI  Procedures:  EGD 8/23  Antibiotics:  None   Subjective: Denies complaints. States that she had a dark brown and not black stool this am. No CP or SOB reported. Nasal stuffiness.  Objective: Filed Vitals:   07/06/14 0856 07/06/14 0857 07/06/14 1400 07/06/14 1500  BP: 104/64  97/55 122/58  Pulse: 70  70   Temp:   97.8 F (36.6 C)   TempSrc:   Oral   Resp: 14  17   Height:      Weight:      SpO2: 88% 96% 95% 92%    Intake/Output Summary (Last 24 hours) at 07/06/14 1622 Last data filed at 07/06/14 1417  Gross per 24 hour  Intake    520 ml  Output    950 ml  Net   -430 ml   [employer]  07/04/14 1259 07/05/14 0530 07/06/14 0529  Weight: 70.308 kg (155 lb) 71.2 kg (156 lb 15.5 oz) 71.7 kg (158 lb 1.1 oz)     Exam:  General exam: Pleasant elderly frail female lying comfortably propped up in bed without distress. Respiratory system: Few basal crackles but otherwise clear to auscultation. No increased work of  breathing. Cardiovascular system: S1 & S2 heard, RRR. No JVD, murmurs, gallops, clicks or pedal edema. Telemetry: A. fib with on demand Vpaced rhythm. Gastrointestinal system: Abdomen is nondistended, soft and nontender. Normal bowel sounds heard. Central nervous system: Alert and oriented. No focal neurological deficits. Extremities: Symmetric 5 x 5 power.   Data Reviewed: Basic Metabolic Panel:  Recent Labs Lab 06/30/14 0420 07/03/14 1516 07/04/14 1349 07/05/14 0147 07/06/14 0430  NA 133* 135 141 140 138  K 4.2 4.9 4.5 4.0 3.7  CL 98 100 100 102 98  CO2 27 29 28 24 29   GLUCOSE 115* 93 117* 106* 103*  BUN 22 25* 24* 23 25*  CREATININE 1.23* 1.5* 1.49* 1.55* 1.73*  CALCIUM 9.3 9.5 9.9 9.0 9.0  MG  --   --  2.2  --   --    Liver Function Tests:  Recent Labs Lab 07/04/14 1349  AST 14  ALT 10  ALKPHOS 99  BILITOT 0.6  PROT 7.0  ALBUMIN 3.2*   No results found for this basename: LIPASE, AMYLASE,  in the last 168 hours No results found for this basename: AMMONIA,  in the last 168 hours CBC:  Recent Labs Lab 07/03/14 1524 07/04/14 1349 07/06/14 0430  WBC  --  5.7 5.9  NEUTROABS  --  4.3  --   HGB 8.9* 9.6* 9.1*  HCT  --  31.3* 28.9*  MCV  --  87.2 85.5  PLT  --  217 201   Cardiac Enzymes:  Recent Labs Lab 07/04/14 1349 07/04/14 2016 07/05/14 0147  TROPONINI <0.30 <0.30 <0.30   BNP (last 3 results)  Recent Labs  06/16/14 1237 06/27/14 1721 07/04/14 1349  PROBNP 543.0* 1950.0* 2180.0*   CBG: No results found for this basename: GLUCAP,  in the last 168 hours  No results found for this or any previous visit (from the past 240 hour(s)).        Studies: Dg Chest 2 View  07/05/2014   CLINICAL DATA:  Diastolic heart failure, dyspnea and abdominal pain  EXAM: CHEST  2 VIEW  COMPARISON:  Prior chest x-ray 06/28/2014  FINDINGS: Stable position of right subclavian approach cardiac rhythm maintenance device with leads projecting over the right atrium  and right ventricle. Unchanged globular cardiomegaly. Atherosclerotic calcifications present in the transverse aorta. Linear opacities in the left mid lung are similar compared to prior and may reflect atelectasis or scarring. Persistent mild elevation of the left hemidiaphragm. Is no new focal airspace consolidation, pulmonary edema or pneumothorax. Stable central bronchitic changes and diffuse mild interstitial prominence. No acute osseous abnormality. The visualized upper abdominal bowel gas pattern is unremarkable.  IMPRESSION: Stable chest x-ray without evidence of acute process.   Electronically Signed   By: Jacqulynn Cadet M.D.   On: 07/05/2014 09:06   Dg Lumbar Spine 2-3 Views  07/05/2014   CLINICAL DATA:  Back pain; known scoliosis  EXAM: LUMBAR SPINE - 2-3 VIEW  COMPARISON:  Coronal and sagittal images from and abdominal pelvic CT scan of July 20, 2012.  FINDINGS: There is moderate mid thoracic scoliosis with a rotatory component. There is mild disc space narrowing  at all lumbar levels. There is no acute compression fracture. The observed portions of the sacrum are grossly normal. There is dense calcification in the wall of the abdominal aorta.  IMPRESSION: There is levoscoliosis of the lumbar spine with moderate degenerative disc and facet joint change.   Electronically Signed   By: David  Martinique   On: 07/05/2014 19:38        Scheduled Meds: . amiodarone  200 mg Oral BID  . docusate sodium  100 mg Oral BID  . levothyroxine  75 mcg Oral QAC breakfast  . metoprolol tartrate  25 mg Oral Q12H  . pantoprazole  40 mg Oral Q0600  . polyethylene glycol  17 g Oral BID  . senna  2 tablet Oral Daily  . simvastatin  10 mg Oral q1800  . sodium chloride  3 mL Intravenous Q12H   Continuous Infusions: . sodium chloride 500 mL (07/06/14 0823)    Active Problems:   Acute diastolic heart failure   Acute on chronic systolic ACC/AHA stage C congestive heart failure   Melena   Anemia, iron  deficiency   CKD (chronic kidney disease) stage 3, GFR 30-59 ml/min   Other specified gastritis without mention of hemorrhage    Time spent: 30 minutes.    Vernell Leep, MD, FACP, FHM. [facility] Pager [phone]  If 7PM-7AM, please contact night-coverage [email or web address] Password [email or web address] 07/06/2014, 4:22 PM    LOS: 2 days

## 2014-07-06 NOTE — Progress Notes (Signed)
Patient sleeping peacefully. IV fluids of NSS at 20 ml an hour started at midnight as per MD order. Patient denies c/o pain /discomfort at present time. Will continue to monitor.  Esperanza Heir, RN

## 2014-07-07 ENCOUNTER — Ambulatory Visit: Payer: Medicare Other | Admitting: Internal Medicine

## 2014-07-07 ENCOUNTER — Encounter (HOSPITAL_COMMUNITY): Payer: Self-pay | Admitting: Gastroenterology

## 2014-07-07 ENCOUNTER — Inpatient Hospital Stay (HOSPITAL_COMMUNITY): Payer: Medicare Other

## 2014-07-07 ENCOUNTER — Telehealth: Payer: Self-pay | Admitting: Internal Medicine

## 2014-07-07 LAB — BASIC METABOLIC PANEL
Anion gap: 11 (ref 5–15)
BUN: 25 mg/dL — ABNORMAL HIGH (ref 6–23)
CO2: 28 mEq/L (ref 19–32)
Calcium: 9.5 mg/dL (ref 8.4–10.5)
Chloride: 100 mEq/L (ref 96–112)
Creatinine, Ser: 1.53 mg/dL — ABNORMAL HIGH (ref 0.50–1.10)
GFR calc Af Amer: 34 mL/min — ABNORMAL LOW (ref >90)
GFR calc non Af Amer: 29 mL/min — ABNORMAL LOW (ref >90)
Glucose, Bld: 107 mg/dL — ABNORMAL HIGH (ref 70–99)
Potassium: 4 mEq/L (ref 3.7–5.3)
Sodium: 139 mEq/L (ref 137–147)

## 2014-07-07 LAB — CBC
HEMATOCRIT: 30.4 % — AB (ref 36.0–46.0)
HEMOGLOBIN: 9.5 g/dL — AB (ref 12.0–15.0)
MCH: 26.6 pg (ref 26.0–34.0)
MCHC: 31.3 g/dL (ref 30.0–36.0)
MCV: 85.2 fL (ref 78.0–100.0)
Platelets: 212 10*3/uL (ref 150–400)
RBC: 3.57 MIL/uL — AB (ref 3.87–5.11)
RDW: 16 % — ABNORMAL HIGH (ref 11.5–15.5)
WBC: 5.7 10*3/uL (ref 4.0–10.5)

## 2014-07-07 MED ORDER — FUROSEMIDE 20 MG PO TABS
20.0000 mg | ORAL_TABLET | Freq: Every day | ORAL | Status: DC
  Administered 2014-07-07: 20 mg via ORAL
  Filled 2014-07-07 (×2): qty 1

## 2014-07-07 MED ORDER — MORPHINE SULFATE 2 MG/ML IJ SOLN
1.0000 mg | INTRAMUSCULAR | Status: DC | PRN
  Administered 2014-07-07 – 2014-07-11 (×5): 2 mg via INTRAVENOUS
  Filled 2014-07-07 (×5): qty 1

## 2014-07-07 MED ORDER — GI COCKTAIL ~~LOC~~
30.0000 mL | Freq: Two times a day (BID) | ORAL | Status: DC | PRN
  Filled 2014-07-07: qty 30

## 2014-07-07 NOTE — Progress Notes (Signed)
Received PRN order for 2 mg of Morphine IVP from Dr. Sherral Hammers and administered to patient for complaint of severe right sided abdominal pain as well as lower back pain. Patient rates pain a 10.10 at present time. Will continue to monitor.  Esperanza Heir, RN

## 2014-07-07 NOTE — Progress Notes (Signed)
Patient complaining of left side pain and rates it a 4/10. Requesting a heat pad as well as Tylenol. Tylenol 650 mg administered as per MD order, and heating pad supplied. Helped reposition patient in the bed. Will continue to monitor.  Esperanza Heir, RN

## 2014-07-07 NOTE — Progress Notes (Signed)
Patient states had frequent loose stools yesterday.  Notified Alonza Bogus PA with GI.   Will hold stool softeners and laxatives.   Will continue to monitor

## 2014-07-07 NOTE — Progress Notes (Signed)
Notified CCMD that patient is now nontele.  Telemetry box discontinued.

## 2014-07-07 NOTE — Progress Notes (Signed)
Patient removed oxygen.  O2 sat dropped to 76% on room air.  O2 at 2LPM applied via nasal cannula.  O2 sats increase to 95%. Will continue to monitor

## 2014-07-07 NOTE — Progress Notes (Signed)
[facility] Progress Note  Subjective:  Was given Dickson, [name], Linda colace yesterday Linda had several BMs that she describes as medium to dark brown in color.  No BM this AM.  She says that he breathing is about at baseline.  Objective:  Vital signs in last 24 hours: Temp:  [97.5 F (36.4 C)-98.1 F (36.7 C)] 98.1 F (36.7 C) (08/24 0500) Pulse Rate:  [70-75] 75 (08/24 0900) Resp:  [16-18] 16 (08/24 0500) BP: (97-122)/(50-67) 110/67 mmHg (08/24 0900) SpO2:  [92 %-100 %] 94 % (08/24 0900) Weight:  [155 lb 3.3 oz (70.4 kg)] 155 lb 3.3 oz (70.4 kg) (08/24 0500) Last BM Date: 07/06/14 General:  Alert, Well-developed, in NAD Heart:  Irregular. Pulm:  CTAB.  No W/R/R. Abdomen:  Soft, non-distended. Normal bowel sounds.  Non-tender. Extremities:  Without edema. Neurologic:  Alert Linda  oriented x4;  grossly normal neurologically. Psych:  Alert Linda cooperative. Normal mood Linda affect.  Intake/Output from previous day: 08/23 0701 - 08/24 0700 In: 740 [P.O.:640; I.V.:100] Out: 550 [Urine:550] Intake/Output this shift: Total I/O In: 243 [P.O.:240; I.V.:3] Out: -   Lab Results:  Recent Labs  07/04/14 1349 07/06/14 0430 07/07/14 0440  WBC 5.7 5.9 5.7  HGB 9.6* 9.1* 9.5*  HCT 31.3* 28.9* 30.4*  PLT 217 201 212   BMET  Recent Labs  07/05/14 0147 07/06/14 0430 07/07/14 0440  NA 140 138 139  K 4.0 3.7 4.0  CL 102 98 100  CO2 24 29 28   GLUCOSE 106* 103* 107*  BUN 23 25* 25*  CREATININE 1.55* 1.73* 1.53*  CALCIUM 9.0 9.0 9.5   LFT  Recent Labs  07/04/14 1349  PROT 7.0  ALBUMIN 3.2*  AST 14  ALT 10  ALKPHOS 99  BILITOT 0.6   PT/INR  Recent Labs  07/04/14 1349  LABPROT 18.4*  INR 1.53*   Dg Lumbar Spine 2-3 Views  07/05/2014   CLINICAL DATA:  Back pain; known scoliosis  EXAM: LUMBAR SPINE - 2-3 VIEW  COMPARISON:  Coronal Linda sagittal images from Linda abdominal pelvic CT scan of July 20, 2012.  FINDINGS: There is moderate mid  thoracic scoliosis with a rotatory component. There is mild disc space narrowing at all lumbar levels. There is no acute compression fracture. The observed portions of the sacrum are grossly normal. There is dense calcification in the wall of the abdominal aorta.  IMPRESSION: There is levoscoliosis of the lumbar spine with moderate degenerative disc Linda facet joint change.   Electronically Signed   By: David  Martinique   On: 07/05/2014 19:38    Assessment / Plan: -Anemia, Fe deficiency Linda GI bleeding, subacute with complaints of black stools last in the setting of Eliquis. Recently started on iron just last week as well.  Hgb is stable.  EGD with only mild gastritis Linda small HH; pathology pending.  No source of GI bleeding found. Heme negative x 2 since admission. -Atrial fibrillation: On Eliquis since 01/2014, but currently on hold since 8/21.  -CHF/CAD/Atrial fibrillation:  Stable.  -CKD: Fairly stable.   *Possibly colonoscopy 8/25 per Dr. Ardis Hughs.  Will place her on clear liquids for now in case of procedure. *Monitor Hgb.   LOS: 3 days   ZEHR, JESSICA D.  07/07/2014, 10:45 AM  Pager number [phone]   ________________________________________________________________________  Velora Heckler GI MD note:  I personally examined the patient, reviewed the data.  She become sob this early afternoon Linda oxygen sat was in 70s on  RA, [place] oxygen restarted.  She is heme negative, not overtly bleeding Linda now having breathing problems.  Originally planning on colonoscopy tomorrow however with her breathing trouble that would not be smart.  Will recheck on her tomorrow, restart low salt diet for now.   Owens Loffler, MD [facility] Pager [phone]

## 2014-07-07 NOTE — Progress Notes (Signed)
    SUBJECTIVE:  No SOB.   PHYSICAL EXAM Filed Vitals:   07/06/14 1500 07/06/14 2100 07/07/14 0225 07/07/14 0500  BP: 122/58 101/50 98/67 110/65  Pulse:  72 71 74  Temp:  98 F (36.7 C) 97.5 F (36.4 C) 98.1 F (36.7 C)  TempSrc:  Oral Oral Oral  Resp:  18 17 16   Height:      Weight:    155 lb 3.3 oz (70.4 kg)  SpO2: 92% 98% 95% 100%   General:  No distress Lungs:  Clear Heart:  Irregular Abdomen:  Positive bowel sounds, no rebound no guarding Extremities:  No edema   LABS:  Results for orders placed during the hospital encounter of 07/04/14 (from the past 24 hour(s))  BASIC METABOLIC PANEL     Status: Abnormal   Collection Time    07/07/14  4:40 AM      Result Value Ref Range   Sodium 139  137 - 147 mEq/L   Potassium 4.0  3.7 - 5.3 mEq/L   Chloride 100  96 - 112 mEq/L   CO2 28  19 - 32 mEq/L   Glucose, Bld 107 (*) 70 - 99 mg/dL   BUN 25 (*) 6 - 23 mg/dL   Creatinine, Ser 1.53 (*) 0.50 - 1.10 mg/dL   Calcium 9.5  8.4 - 10.5 mg/dL   GFR calc non Af Amer 29 (*) >90 mL/min   GFR calc Af Amer 34 (*) >90 mL/min   Anion gap 11  5 - 15  CBC     Status: Abnormal   Collection Time    07/07/14  4:40 AM      Result Value Ref Range   WBC 5.7  4.0 - 10.5 K/uL   RBC 3.57 (*) 3.87 - 5.11 MIL/uL   Hemoglobin 9.5 (*) 12.0 - 15.0 g/dL   HCT 30.4 (*) 36.0 - 46.0 %   MCV 85.2  78.0 - 100.0 fL   MCH 26.6  26.0 - 34.0 pg   MCHC 31.3  30.0 - 36.0 g/dL   RDW 16.0 (*) 11.5 - 15.5 %   Platelets 212  150 - 400 K/uL    Intake/Output Summary (Last 24 hours) at 07/07/14 0930 Last data filed at 07/06/14 2240  Gross per 24 hour  Intake    640 ml  Output    450 ml  Net    190 ml    ASSESSMENT AND PLAN:  Acute diastolic heart failure:  Anemia, iron deficiency:  Upper GI with gastritis.    Atrial fib:    Plan to try to get her back into sinus in the future when she has been restarted on anticoagulation.  Continue amio and beta blocker as rate control.  Discontinue telemetry.     We will see as needed.     Jeneen Rinks [facility] 07/07/2014 9:30 AM

## 2014-07-07 NOTE — Progress Notes (Signed)
PROGRESS NOTE    [name] LPF:[id number] DOB: 1925-06-06 DOA: 07/04/2014 PCP: [name], MD  HPI/Brief narrative 80 year old female with nonobstructive CAD, diastolic CHF, permanent atrial fibrillation, CKD stage III, hyperlipidemia, hypothyroidism, and iron deficiency anemia was admitted on 07/04/2014 because of shortness of breath, orthopnea, and leg edema. She was admitted to the cardiology service for acute on chronic diastolic CHF. The patient was started on intravenous furosemide 40 mg IV every 12 hours. She has had a good clinical response. Dr. Rayann Heman has requested the hospitalist service assume care of patient due to patient's multiple co-morbidities.   Notably, the patient was recently discharged from the hospital on 06/30/2014 acute on chronic CHF. Her weight at the time of discharge was 69.9 kg. The patient was seen in her primary care provider's office on 07/03/2014 and refused admission. After being seen by her cardiologist on 07/04/2014, the patient agreed for admission.Patient was admitted 04/2014 x2 with acute on chronic diastolic CHF. She underwent cardioversion with restoration of NSR. This resulted in profound bradycardia and hypotension requiring pacemaker implantation. Pacemaker implant was complicated by acute blood loss anemia requiring transfusion with PRBCs. Her Eliquis was held for several days. She was discharged to SNF. Carvedilol was held secondary to hypotension. The apixiban has been since restarted. However, the patient reported melanotic stools prior to admission. She denies any hematochezia or hematemesis. She states that she was recently started on iron supplementation.    Assessment/Plan:  Iron deficiency anemia/melanotic stool  -The patient's hemoglobin has gradually dropped over the past 2 months  -Although she has had a hemoglobin of 13.6 on 05/08/2014, the overall trend of her hemoglobin over the past 3 months has been 11-12  -Iron  saturation 3% with low ferritin--06/27/2014  -continue iron supplementation  - EGD 8/23 showed gastritis> FU Bx. Continue PPI. In pt colonoscopy per GI- deferred due to new SOB on 8/24. GI will reassess in AM. FOBT x2 negative since admission. Brown stools currently per patient. - Hb stable  acute on chronic diastolic CHF  -53/29/9242 after she was EF 60-65%,  - treated with IV furosemide per cardiology  -Discharge weight on 06/29/2014--69.9 kg  - now appears euvolemic & Lasix DC'ed - Hypoxia on 8/24: Probably still with component of pulmonary edema. Resume Lasix. Follow chest x-ray.  Acute hypoxic respiratory failure - Secondary to decompensated CHF. Follow chest x-ray. Consider resuming Lasix. Oxygen supplement and follow  atrial fibrillation  -Continue metoprolol tartrate and amiodarone  -Rate controlled  -Apixiban presently on hold pending GI evaluation   hypothyroidism  -Continue Synthroid  -TSH 2.760   Hyperlipidemia  -Continue statin   AoCKD stage III  -Since early July, baseline creatinine has been 1.2-1.5  -Prior to June 2015--baseline creatinine 0.9-1.2  -Continue to monitor with diuresis   Low back pain/upper back pain  -UA neg -lumbar xray- no acute findings. - On 8/4, patient complained of pain of shoulder blades overnight-resolved after pain medications. Monitor   Code Status: Full Family Communication: None at bedside Disposition Plan: Home when medically stable   Consultants:  Cardiology  GI  Procedures:  EGD 8/23  Antibiotics:  None   Subjective: Complained of significant pain at shoulder blades overnight-resolved after pain medications. Had some right lower quadrant abdominal pain which also resolved spontaneously. Several soft tissue watery brown BMs overnight denies black tarry stools or blood in stools. Did not complain of dyspnea when seen this morning but subsequently had some dyspnea while GI evaluated.  Objective: Filed Vitals:  07/07/14 0900 07/07/14 1217 07/07/14 1218 07/07/14 1219  BP: 110/67     Pulse: 75     Temp:      TempSrc:      Resp:      Height:      Weight:      SpO2: 94% 78% 93% 95%   temperature 98.47F, pulse 75 per minute, respiratory rate 16 per minute  Intake/Output Summary (Last 24 hours) at 07/07/14 1407 Last data filed at 07/07/14 1100  Gross per 24 hour  Intake    683 ml  Output    600 ml  Net     83 ml   Filed Weights   07/05/14 0530 07/06/14 0529 07/07/14 0500  Weight: 71.2 kg (156 lb 15.5 oz) 71.7 kg (158 lb 1.1 oz) 70.4 kg (155 lb 3.3 oz)     Exam:  General exam: Pleasant elderly frail female was sitting at edge of bed comfortably this morning. Respiratory system: Few basal crackles but otherwise clear to auscultation. No increased work of breathing. Cardiovascular system: S1 & S2 heard, RRR. No JVD, murmurs, gallops, clicks or pedal edema.  Gastrointestinal system: Abdomen is nondistended, soft and nontender. Normal bowel sounds heard. Central nervous system: Alert and oriented. No focal neurological deficits. Extremities: Symmetric 5 x 5 power. Musculoskeletal: No reproducible tenderness across scapula and no acute findings.   Data Reviewed: Basic Metabolic Panel:  Recent Labs Lab 07/03/14 1516 07/04/14 1349 07/05/14 0147 07/06/14 0430 07/07/14 0440  NA 135 141 140 138 139  K 4.9 4.5 4.0 3.7 4.0  CL 100 100 102 98 100  CO2 29 28 24 29 28   GLUCOSE 93 117* 106* 103* 107*  BUN 25* 24* 23 25* 25*  CREATININE 1.5* 1.49* 1.55* 1.73* 1.53*  CALCIUM 9.5 9.9 9.0 9.0 9.5  MG  --  2.2  --   --   --    Liver Function Tests:  Recent Labs Lab 07/04/14 1349  AST 14  ALT 10  ALKPHOS 99  BILITOT 0.6  PROT 7.0  ALBUMIN 3.2*   No results found for this basename: LIPASE, AMYLASE,  in the last 168 hours No results found for this basename: AMMONIA,  in the last 168 hours CBC:  Recent Labs Lab 07/03/14 1524 07/04/14 1349 07/06/14 0430 07/07/14 0440  WBC  --   5.7 5.9 5.7  NEUTROABS  --  4.3  --   --   HGB 8.9* 9.6* 9.1* 9.5*  HCT  --  31.3* 28.9* 30.4*  MCV  --  87.2 85.5 85.2  PLT  --  217 201 212   Cardiac Enzymes:  Recent Labs Lab 07/04/14 1349 07/04/14 2016 07/05/14 0147  TROPONINI <0.30 <0.30 <0.30   BNP (last 3 results)  Recent Labs  06/16/14 1237 06/27/14 1721 07/04/14 1349  PROBNP 543.0* 1950.0* 2180.0*   CBG: No results found for this basename: GLUCAP,  in the last 168 hours  No results found for this or any previous visit (from the past 240 hour(s)).        Studies: Dg Lumbar Spine 2-3 Views  07/05/2014   CLINICAL DATA:  Back pain; known scoliosis  EXAM: LUMBAR SPINE - 2-3 VIEW  COMPARISON:  Coronal and sagittal images from and abdominal pelvic CT scan of July 20, 2012.  FINDINGS: There is moderate mid thoracic scoliosis with a rotatory component. There is mild disc space narrowing at all lumbar levels. There is no acute compression fracture. The observed portions of the sacrum  are grossly normal. There is dense calcification in the wall of the abdominal aorta.  IMPRESSION: There is levoscoliosis of the lumbar spine with moderate degenerative disc and facet joint change.   Electronically Signed   By: David  Martinique   On: 07/05/2014 19:38        Scheduled Meds: . amiodarone  200 mg Oral BID  . docusate sodium  100 mg Oral BID  . levothyroxine  75 mcg Oral QAC breakfast  . metoprolol tartrate  25 mg Oral Q12H  . pantoprazole  40 mg Oral Q0600  . polyethylene glycol  17 g Oral BID  . senna  2 tablet Oral Daily  . simvastatin  10 mg Oral q1800  . sodium chloride  3 mL Intravenous Q12H   Continuous Infusions:    Active Problems:   Acute diastolic heart failure   Acute on chronic systolic ACC/AHA stage C congestive heart failure   Melena   Anemia, iron deficiency   CKD (chronic kidney disease) stage 3, GFR 30-59 ml/min   Other specified gastritis without mention of hemorrhage    Time spent: 30  minutes.    Vernell Leep, MD, FACP, FHM. [facility] Pager [phone]  If 7PM-7AM, please contact night-coverage [email or web address] Password [email or web address] 07/07/2014, 2:07 PM    LOS: 3 days

## 2014-07-07 NOTE — Telephone Encounter (Signed)
Relevant patient education assigned to patient using Emmi. ° °

## 2014-07-07 NOTE — Clinical Documentation Improvement (Signed)
  Conflicting information is currently documented in the medical record.  "Acute Diastolic Heart Failure" AND "Acute on chronic systolic ACC/AHA stage C congestive heart failure" also documented in current medical record.  Last Echo 02/25/14 - Study Conclusions - Left ventricle: The cavity size was normal. Wall thickness was increased in a pattern of mild LVH. There was focal basal hypertrophy. Systolic function was normal. The estimated ejection fraction was in the range of 60% to 65%. Wall motion was normal; there were no regional wall motion abnormalities. - Left atrium: The atrium was mildly dilated. - Right atrium: The atrium was mildly dilated. - Pericardium, extracardiac: A trivial pericardial effusion was identified.  Please clarify the TYPE of Acute on Chronic Heart Failure monitored and treated this admission:   - Systolic  - Diastolic  - Combined  - Other Type of Heart Failure  - Unable to Clinically Determine  Thank You, [name] ,RN Clinical Documentation Specialist:  [phone] [facility] [employer]

## 2014-07-08 DIAGNOSIS — J9601 Acute respiratory failure with hypoxia: Secondary | ICD-10-CM | POA: Diagnosis present

## 2014-07-08 DIAGNOSIS — J9600 Acute respiratory failure, unspecified whether with hypoxia or hypercapnia: Secondary | ICD-10-CM

## 2014-07-08 DIAGNOSIS — I5031 Acute diastolic (congestive) heart failure: Secondary | ICD-10-CM

## 2014-07-08 LAB — CBC
HEMATOCRIT: 28.1 % — AB (ref 36.0–46.0)
Hemoglobin: 8.7 g/dL — ABNORMAL LOW (ref 12.0–15.0)
MCH: 26.6 pg (ref 26.0–34.0)
MCHC: 31 g/dL (ref 30.0–36.0)
MCV: 85.9 fL (ref 78.0–100.0)
Platelets: 183 10*3/uL (ref 150–400)
RBC: 3.27 MIL/uL — ABNORMAL LOW (ref 3.87–5.11)
RDW: 16.2 % — AB (ref 11.5–15.5)
WBC: 4.8 10*3/uL (ref 4.0–10.5)

## 2014-07-08 LAB — BASIC METABOLIC PANEL
Anion gap: 10 (ref 5–15)
BUN: 23 mg/dL (ref 6–23)
CHLORIDE: 98 meq/L (ref 96–112)
CO2: 27 mEq/L (ref 19–32)
Calcium: 9.5 mg/dL (ref 8.4–10.5)
Creatinine, Ser: 1.52 mg/dL — ABNORMAL HIGH (ref 0.50–1.10)
GFR calc non Af Amer: 30 mL/min — ABNORMAL LOW (ref >90)
GFR, EST AFRICAN AMERICAN: 34 mL/min — AB (ref >90)
Glucose, Bld: 108 mg/dL — ABNORMAL HIGH (ref 70–99)
Potassium: 4.3 mEq/L (ref 3.7–5.3)
Sodium: 135 mEq/L — ABNORMAL LOW (ref 137–147)

## 2014-07-08 MED ORDER — FUROSEMIDE 20 MG PO TABS
20.0000 mg | ORAL_TABLET | Freq: Two times a day (BID) | ORAL | Status: DC
  Filled 2014-07-08 (×3): qty 1

## 2014-07-08 MED ORDER — FUROSEMIDE 10 MG/ML IJ SOLN
INTRAMUSCULAR | Status: AC
  Filled 2014-07-08: qty 4

## 2014-07-08 MED ORDER — FUROSEMIDE 10 MG/ML IJ SOLN
20.0000 mg | Freq: Two times a day (BID) | INTRAMUSCULAR | Status: DC
  Administered 2014-07-08 (×2): 20 mg via INTRAVENOUS
  Filled 2014-07-08 (×2): qty 2

## 2014-07-08 NOTE — Progress Notes (Signed)
PROGRESS NOTE    [name]:[id number] DOB: 08-18-1925 DOA: 07/04/2014 PCP: [name], MD  HPI/Brief narrative 81 year old female with nonobstructive CAD, diastolic CHF, permanent atrial fibrillation, CKD stage III, hyperlipidemia, hypothyroidism, and iron deficiency anemia was admitted on 07/04/2014 because of shortness of breath, orthopnea, and leg edema. She was admitted to the cardiology service for acute on chronic diastolic CHF. The patient was started on intravenous furosemide 40 mg IV every 12 hours. She has had a good clinical response. Dr. Rayann Heman has requested the hospitalist service assume care of patient due to patient's multiple co-morbidities.   Notably, the patient was recently discharged from the hospital on 06/30/2014 acute on chronic CHF. Her weight at the time of discharge was 69.9 kg. The patient was seen in her primary care provider's office on 07/03/2014 and refused admission. After being seen by her cardiologist on 07/04/2014, the patient agreed for admission.Patient was admitted 04/2014 x2 with acute on chronic diastolic CHF. She underwent cardioversion with restoration of NSR. This resulted in profound bradycardia and hypotension requiring pacemaker implantation. Pacemaker implant was complicated by acute blood loss anemia requiring transfusion with PRBCs. Her Eliquis was held for several days. She was discharged to SNF. Carvedilol was held secondary to hypotension. The apixiban has been since restarted. However, the patient reported melanotic stools prior to admission. She denies any hematochezia or hematemesis. She states that she was recently started on iron supplementation.  Patient status post EGD that showed gastritis. Colonoscopy is pending stabilization of patient's respiratory status. Cardiology had signed off 8/24 but re consulted 8/25 due to ongoing SOB 2/2 decompensated CHF    Assessment/Plan:  Iron deficiency anemia/melanotic stool  -The  patient's hemoglobin has gradually dropped over the past 2 months  -Although she has had a hemoglobin of 13.6 on 05/08/2014, the overall trend of her hemoglobin over the past 3 months has been 11-12  -Iron saturation 3% with low ferritin--06/27/2014  -continue iron supplementation  - EGD 8/23 showed gastritis> FU Bx. Continue PPI. In pt colonoscopy per GI- deferred due to new SOB on 8/24. FOBT x2 negative since admission. Brown stools currently per patient. FOBT x2 negative. - GI has reevaluated today and tentatively plan for colonoscopy on 07/09/14 pending stabilization of respiratory status. - Hemoglobin has slightly dropped from 9.5 > 8.7. Follow CBC in a.m.  acute on chronic diastolic CHF  -40/06/6760 after she was EF 60-65%,  -Discharge weight on 06/29/2014--69.9 kg  - Patient was initially treated with IV Lasix and then Lasix was discontinued by cardiology over the weekend. On 8/24, patient dyspneic and hypoxic. Chest x-ray revealed CHF. By mouth Lasix was resumed but changed to IV 20 mg twice a day on 8/25. - Monitor closely. Cardiology was reconsulted on 8/25.  Acute hypoxic respiratory failure - Secondary to decompensated CHF. Management as above.  atrial fibrillation  -Continue metoprolol tartrate and amiodarone  -Rate controlled  -Apixiban presently on hold pending GI evaluation   hypothyroidism  -Continue Synthroid  -TSH 2.760   Hyperlipidemia  -Continue statin   AoCKD stage III  -Since early July, baseline creatinine has been 1.2-1.5  -Prior to June 2015--baseline creatinine 0.9-1.2  -Continue to monitor with diuresis. Creatinine stable in the 1.5 range   Low back pain/upper back pain  -UA neg -lumbar xray- no acute findings. - On 8/4, patient complained of pain of shoulder blades overnight-resolved after pain medications. Monitor - Has not reported any further back pain.  Hypertension - Soft blood pressures: Monitor  Failure to thrive - As per discussion with  patient's daughter in [place], patient has been progressively declining with frequent hospitalizations over the last few months. She is going to discuss with her siblings in [place] regarding further course of action including palliative care consultation.     Code Status: Full Family Communication: Discussed with Ms. Sherron Monday on 8/25. She will discuss with siblings and get back to [place] regarding further course of action i.e. aggressive care versus palliative care consultation.  Disposition Plan: Home when medically stable   Consultants:  Cardiology  GI  Procedures:  EGD 8/23  Antibiotics:  None   Subjective: Complaints of dyspnea and weakness. Denies cough or chest pain.  Objective: Filed Vitals:   07/08/14 0455 07/08/14 0810 07/08/14 0930 07/08/14 1025  BP: 104/57 100/61 95/43   Pulse: 70 81 70   Temp: 97.5 F (36.4 C) 97.2 F (36.2 C) 97.7 F (36.5 C)   TempSrc: Oral Oral Oral   Resp: 17 18 17    Height:      Weight: 70.1 kg (154 lb 8.7 oz)     SpO2: 98% 99% 99% 100%     Intake/Output Summary (Last 24 hours) at 07/08/14 1331 Last data filed at 07/08/14 1319  Gross per 24 hour  Intake    690 ml  Output   1075 ml  Net   -385 ml   Filed Weights   07/06/14 0529 07/07/14 0500 07/08/14 0455  Weight: 71.7 kg (158 lb 1.1 oz) 70.4 kg (155 lb 3.3 oz) 70.1 kg (154 lb 8.7 oz)     Exam:  General exam: Pleasant elderly frail female sitting up in chair without distress. Respiratory system: Reduced breath sounds in the bases with occasional basal crackles. Rest of lung fields clear to auscultation. No increased work of breathing. Cardiovascular system: S1 & S2 heard, RRR. No JVD, murmurs, gallops, clicks or pedal edema.  Gastrointestinal system: Abdomen is nondistended, soft and nontender. Normal bowel sounds heard. Central nervous system: Alert and oriented. No focal neurological deficits. Extremities: Symmetric 5 x 5 power. Musculoskeletal: No reproducible  tenderness across scapula and no acute findings.   Data Reviewed: Basic Metabolic Panel:  Recent Labs Lab 07/04/14 1349 07/05/14 0147 07/06/14 0430 07/07/14 0440 07/08/14 0430  NA 141 140 138 139 135*  K 4.5 4.0 3.7 4.0 4.3  CL 100 102 98 100 98  CO2 28 24 29 28 27   GLUCOSE 117* 106* 103* 107* 108*  BUN 24* 23 25* 25* 23  CREATININE 1.49* 1.55* 1.73* 1.53* 1.52*  CALCIUM 9.9 9.0 9.0 9.5 9.5  MG 2.2  --   --   --   --    Liver Function Tests:  Recent Labs Lab 07/04/14 1349  AST 14  ALT 10  ALKPHOS 99  BILITOT 0.6  PROT 7.0  ALBUMIN 3.2*   No results found for this basename: LIPASE, AMYLASE,  in the last 168 hours No results found for this basename: AMMONIA,  in the last 168 hours CBC:  Recent Labs Lab 07/03/14 1524 07/04/14 1349 07/06/14 0430 07/07/14 0440 07/08/14 0430  WBC  --  5.7 5.9 5.7 4.8  NEUTROABS  --  4.3  --   --   --   HGB 8.9* 9.6* 9.1* 9.5* 8.7*  HCT  --  31.3* 28.9* 30.4* 28.1*  MCV  --  87.2 85.5 85.2 85.9  PLT  --  217 201 212 183   Cardiac Enzymes:  Recent Labs Lab 07/04/14 1349 07/04/14  2016 07/05/14 0147  TROPONINI <0.30 <0.30 <0.30   BNP (last 3 results)  Recent Labs  06/16/14 1237 06/27/14 1721 07/04/14 1349  PROBNP 543.0* 1950.0* 2180.0*   CBG: No results found for this basename: GLUCAP,  in the last 168 hours  No results found for this or any previous visit (from the past 240 hour(s)).        Studies: Dg Chest 2 View  07/07/2014   CLINICAL DATA:  Shortness of breath with several week history of sinus drainage  EXAM: CHEST  2 VIEW  COMPARISON:  PA and lateral chest x-ray of July 05, 2014  FINDINGS: The lungs are adequately inflated. The cardiopericardial silhouette is enlarged. The left hemidiaphragm is obscured. The pulmonary vascularity is mildly prominent though stable. There is stable density lateral to the left heart border consistent with atelectasis or scarring. There is pleural fluid layering  posteriorly on the left. The permanent pacemaker is in appropriate position. There is no acute bony thoracic abnormality.  IMPRESSION: Congestive heart failure with mild interstitial edema, left lower lobe atelectasis, and small left pleural effusion.   Electronically Signed   By: David  Martinique   On: 07/07/2014 16:01        Scheduled Meds: . amiodarone  200 mg Oral BID  . docusate sodium  100 mg Oral BID  . furosemide      . furosemide  20 mg Intravenous BID  . levothyroxine  75 mcg Oral QAC breakfast  . metoprolol tartrate  25 mg Oral Q12H  . pantoprazole  40 mg Oral Q0600  . polyethylene glycol  17 g Oral BID  . senna  2 tablet Oral Daily  . simvastatin  10 mg Oral q1800  . sodium chloride  3 mL Intravenous Q12H   Continuous Infusions:    Active Problems:   Acute on chronic diastolic heart failure   Melena   Anemia, iron deficiency   CKD (chronic kidney disease) stage 3, GFR 30-59 ml/min   Other specified gastritis without mention of hemorrhage    Time spent: 40 minutes.    Vernell Leep, MD, FACP, FHM. [facility] Pager [phone]  If 7PM-7AM, please contact night-coverage [email or web address] Password [email or web address] 07/08/2014, 1:31 PM    LOS: 4 days

## 2014-07-08 NOTE — Progress Notes (Signed)
    SUBJECTIVE:  Mild SOB.   PHYSICAL EXAM Filed Vitals:   07/08/14 0455 07/08/14 0810 07/08/14 0930 07/08/14 1025  BP: 104/57 100/61 95/43   Pulse: 70 81 70   Temp: 97.5 F (36.4 C) 97.2 F (36.2 C) 97.7 F (36.5 C)   TempSrc: Oral Oral Oral   Resp: 17 18 17    Height:      Weight: 154 lb 8.7 oz (70.1 kg)     SpO2: 98% 99% 99% 100%   General:  No distress Lungs:  Few basilar crackles. Heart:  Irregular Abdomen:  Positive bowel sounds, no rebound no guarding Extremities:  No edema   LABS:  Results for orders placed during the hospital encounter of 07/04/14 (from the past 24 hour(s))  CBC     Status: Abnormal   Collection Time    07/08/14  4:30 AM      Result Value Ref Range   WBC 4.8  4.0 - 10.5 K/uL   RBC 3.27 (*) 3.87 - 5.11 MIL/uL   Hemoglobin 8.7 (*) 12.0 - 15.0 g/dL   HCT 28.1 (*) 36.0 - 46.0 %   MCV 85.9  78.0 - 100.0 fL   MCH 26.6  26.0 - 34.0 pg   MCHC 31.0  30.0 - 36.0 g/dL   RDW 16.2 (*) 11.5 - 15.5 %   Platelets 183  150 - 400 K/uL  BASIC METABOLIC PANEL     Status: Abnormal   Collection Time    07/08/14  4:30 AM      Result Value Ref Range   Sodium 135 (*) 137 - 147 mEq/L   Potassium 4.3  3.7 - 5.3 mEq/L   Chloride 98  96 - 112 mEq/L   CO2 27  19 - 32 mEq/L   Glucose, Bld 108 (*) 70 - 99 mg/dL   BUN 23  6 - 23 mg/dL   Creatinine, Ser 1.52 (*) 0.50 - 1.10 mg/dL   Calcium 9.5  8.4 - 10.5 mg/dL   GFR calc non Af Amer 30 (*) >90 mL/min   GFR calc Af Amer 34 (*) >90 mL/min   Anion gap 10  5 - 15    Intake/Output Summary (Last 24 hours) at 07/08/14 1349 Last data filed at 07/08/14 1319  Gross per 24 hour  Intake    690 ml  Output   1075 ml  Net   -385 ml    ASSESSMENT AND PLAN:  Acute diastolic heart failure:  Difficult to assess her volume.  However, she will likely continue with some dyspnea and decreased sats related to diastolic dysfunction exacerbated by atrial fib and anemia.   I might suggest Lasix 40 mg bid po for now.    Anemia,  iron deficiency:  Upper GI with gastritis.    Atrial fib:    Plan to try to get her back into sinus in the future when she has been restarted on anticoagulation.  Continue amio and beta blocker as rate control.  Discontinue telemetry.      Minus Breeding 07/08/2014 1:49 PM

## 2014-07-08 NOTE — Progress Notes (Addendum)
[facility] Progress Note    Since last GI note: Was satting 78% yesterday on RA.  Currently on 2L [place] with good sats. Feels week.     Objective: Vital signs in last 24 hours: Temp:  [97.2 F (36.2 C)-98.1 F (36.7 C)] 97.2 F (36.2 C) (08/25 0810) Pulse Rate:  [69-81] 81 (08/25 0810) Resp:  [17-18] 18 (08/25 0810) BP: (100-140)/(57-71) 100/61 mmHg (08/25 0810) SpO2:  [78 %-100 %] 99 % (08/25 0810) Weight:  [154 lb 8.7 oz (70.1 kg)] 154 lb 8.7 oz (70.1 kg) (08/25 0455) Last BM Date: 07/07/14 General: alert and oriented times 3 Heart: regular rate and rythm Abdomen: soft, non-tender, non-distended, normal bowel sounds Lungs CTA B  Lab Results:  Recent Labs  07/06/14 0430 07/07/14 0440 07/08/14 0430  WBC 5.9 5.7 4.8  HGB 9.1* 9.5* 8.7*  PLT 201 212 183  MCV 85.5 85.2 85.9    Recent Labs  07/06/14 0430 07/07/14 0440 07/08/14 0430  NA 138 139 135*  K 3.7 4.0 4.3  CL 98 100 98  CO2 29 28 27   GLUCOSE 103* 107* 108*  BUN 25* 25* 23  CREATININE 1.73* 1.53* 1.52*  CALCIUM 9.0 9.5 9.5   Studies/Results: Dg Chest 2 View  07/07/2014   CLINICAL DATA:  Shortness of breath with several week history of sinus drainage  EXAM: CHEST  2 VIEW  COMPARISON:  PA and lateral chest x-ray of July 05, 2014  FINDINGS: The lungs are adequately inflated. The cardiopericardial silhouette is enlarged. The left hemidiaphragm is obscured. The pulmonary vascularity is mildly prominent though stable. There is stable density lateral to the left heart border consistent with atelectasis or scarring. There is pleural fluid layering posteriorly on the left. The permanent pacemaker is in appropriate position. There is no acute bony thoracic abnormality.  IMPRESSION: Congestive heart failure with mild interstitial edema, left lower lobe atelectasis, and small left pleural effusion.   Electronically Signed   By: David  Martinique   On: 07/07/2014 16:01    Medications: Scheduled Meds: .  amiodarone  200 mg Oral BID  . docusate sodium  100 mg Oral BID  . furosemide  20 mg Oral BID  . levothyroxine  75 mcg Oral QAC breakfast  . metoprolol tartrate  25 mg Oral Q12H  . pantoprazole  40 mg Oral Q0600  . polyethylene glycol  17 g Oral BID  . senna  2 tablet Oral Daily  . simvastatin  10 mg Oral q1800  . sodium chloride  3 mL Intravenous Q12H   Continuous Infusions:  PRN Meds:.sodium chloride, acetaminophen, gi cocktail, loratadine, morphine injection, ondansetron (ZOFRAN) IV, ondansetron, sodium chloride    Assessment/Plan: 80 y.o. female with IDA, hemocult negative twice in hospital  Given low Hb, IDA, essentially normal EGD we prefer that she undergo colonoscopy to check for significant colon lesions, neoplasia.  She was satting 78% yesterday on RA.  Today feeling still pretty weak.  There was edema in lungs on CXR yesterday.  Will plan on colonoscopy Thursday to give her another day to improve from pulm edema perspective.  Currently scheduled for Thursday 1:30 colonoscopy.  Will need to order prep, diet tomorrow.   Milus Banister, MD  07/08/2014, 9:29 AM [facility] Pager [phone]

## 2014-07-08 NOTE — Progress Notes (Signed)
UR completed Sabria Florido K. Caitlyne Ingham, RN, BSN, [place], CCM  07/08/2014 5:48 PM

## 2014-07-08 NOTE — Progress Notes (Signed)
Pt.is A/Ox4 and is ambulatory with 1 person assist and walker. She had an episode of SOB at rest. Oxygen saturation was taken and lung sounds auscultated and were documented. MD was paged and notified. Was told cardiology would be consulted.

## 2014-07-09 ENCOUNTER — Encounter: Payer: Self-pay | Admitting: Internal Medicine

## 2014-07-09 LAB — BASIC METABOLIC PANEL
ANION GAP: 10 (ref 5–15)
BUN: 25 mg/dL — AB (ref 6–23)
CALCIUM: 9.6 mg/dL (ref 8.4–10.5)
CHLORIDE: 99 meq/L (ref 96–112)
CO2: 27 meq/L (ref 19–32)
CREATININE: 1.44 mg/dL — AB (ref 0.50–1.10)
GFR calc Af Amer: 37 mL/min — ABNORMAL LOW (ref >90)
GFR calc non Af Amer: 32 mL/min — ABNORMAL LOW (ref >90)
Glucose, Bld: 96 mg/dL (ref 70–99)
Potassium: 4.5 mEq/L (ref 3.7–5.3)
Sodium: 136 mEq/L — ABNORMAL LOW (ref 137–147)

## 2014-07-09 LAB — CBC
HEMATOCRIT: 31.3 % — AB (ref 36.0–46.0)
Hemoglobin: 9.7 g/dL — ABNORMAL LOW (ref 12.0–15.0)
MCH: 26.5 pg (ref 26.0–34.0)
MCHC: 31 g/dL (ref 30.0–36.0)
MCV: 85.5 fL (ref 78.0–100.0)
PLATELETS: 194 10*3/uL (ref 150–400)
RBC: 3.66 MIL/uL — ABNORMAL LOW (ref 3.87–5.11)
RDW: 16.1 % — ABNORMAL HIGH (ref 11.5–15.5)
WBC: 5.9 10*3/uL (ref 4.0–10.5)

## 2014-07-09 MED ORDER — FUROSEMIDE 40 MG PO TABS
40.0000 mg | ORAL_TABLET | Freq: Two times a day (BID) | ORAL | Status: DC
  Filled 2014-07-09 (×3): qty 1

## 2014-07-09 MED ORDER — FUROSEMIDE 10 MG/ML IJ SOLN
40.0000 mg | Freq: Two times a day (BID) | INTRAMUSCULAR | Status: DC
  Administered 2014-07-09 – 2014-07-11 (×5): 40 mg via INTRAVENOUS
  Filled 2014-07-09 (×6): qty 4

## 2014-07-09 MED ORDER — FERROUS SULFATE 325 (65 FE) MG PO TABS
325.0000 mg | ORAL_TABLET | Freq: Every day | ORAL | Status: DC
Start: 2014-07-09 — End: 2014-07-14
  Administered 2014-07-09 – 2014-07-13 (×5): 325 mg via ORAL
  Filled 2014-07-09 (×7): qty 1

## 2014-07-09 NOTE — Progress Notes (Signed)
[facility] Progress Note  Subjective:  Is tired today because she has not been getting much sleep due to back pain.  Otherwise no new complaints.  Objective:  Vital signs in last 24 hours: Temp:  [97.5 F (36.4 C)-98.3 F (36.8 C)] 97.5 F (36.4 C) (08/26 0439) Pulse Rate:  [66-70] 66 (08/26 0930) Resp:  [18] 18 (08/26 0439) BP: (93-128)/(52-62) 128/62 mmHg (08/26 0930) SpO2:  [96 %-100 %] 98 % (08/26 0439) Weight:  [155 lb (70.308 kg)] 155 lb (70.308 kg) (08/26 0439) Last BM Date: 07/08/14 General:  Alert, Well-developed, in NAD Heart:  Irregular  Pulm:  Crackles heard at lung bases Abdomen:  Soft, non-distended. Normal bowel sounds.  Non-tender.  Extremities:  Without edema. Neurologic:  Alert and  oriented x4;  grossly normal neurologically. Psych:  Alert and cooperative. Normal mood and affect.  Intake/Output from previous day: 08/25 0701 - 08/26 0700 In: 930 [P.O.:920; I.V.:10] Out: 1050 [Urine:1050] Intake/Output this shift: Total I/O In: 240 [P.O.:240] Out: 126 [Urine:125; Stool:1]  Lab Results:  Recent Labs  07/07/14 0440 07/08/14 0430 07/09/14 0444  WBC 5.7 4.8 5.9  HGB 9.5* 8.7* 9.7*  HCT 30.4* 28.1* 31.3*  PLT 212 183 194   BMET  Recent Labs  07/07/14 0440 07/08/14 0430 07/09/14 0444  NA 139 135* 136*  K 4.0 4.3 4.5  CL 100 98 99  CO2 28 27 27   GLUCOSE 107* 108* 96  BUN 25* 23 25*  CREATININE 1.53* 1.52* 1.44*  CALCIUM 9.5 9.5 9.6   Dg Chest 2 View  07/07/2014   CLINICAL DATA:  Shortness of breath with several week history of sinus drainage  EXAM: CHEST  2 VIEW  COMPARISON:  PA and lateral chest x-ray of July 05, 2014  FINDINGS: The lungs are adequately inflated. The cardiopericardial silhouette is enlarged. The left hemidiaphragm is obscured. The pulmonary vascularity is mildly prominent though stable. There is stable density lateral to the left heart border consistent with atelectasis or scarring. There is pleural  fluid layering posteriorly on the left. The permanent pacemaker is in appropriate position. There is no acute bony thoracic abnormality.  IMPRESSION: Congestive heart failure with mild interstitial edema, left lower lobe atelectasis, and small left pleural effusion.   Electronically Signed   By: David  Martinique   On: 07/07/2014 16:01    Assessment / Plan: -80 y.o. female with IDA, hemocult negative twice in hospital  Given low Hgb, IDA, essentially normal EGD we prefer that she undergo colonoscopy to check for significant colon lesions, neoplasia.  We were tentatively planning for colonoscopy tomorrow, 8/27, but she is still requiring O2 and is planning to go home with it; also has increased crackles on exam.  I have paged Dr. Percival Spanish and am awaiting a call back to discuss whether or not we should proceed with colonoscopy tomorrow.  I will leave her on clear liquids for now until I hear back from him.   LOS: 5 days   ZEHR, JESSICA D.  07/09/2014, 9:39 AM  Pager number [phone]   ________________________________________________________________________  [facility] MD note:  I personally examined the patient, reviewed the data and agree with the assessment and plan described above.  I had a very nice conversation with her daughter Mliss Sax who is CFO of a [employer] in [place].  We agreed that her mother is very frail, elderly, has been in and out of hosp 5-6 times in as many months. She has not had any significant  overt GI bleeding and has been heme neg twice while in hosp.  She has IDA and we discussed simply starting her on oral iron supplements (one pill once daily), restarting her blood thinners needed to reduce risk of stroke and seeing how she does clinically.  The patient and her mother both liked this plan.  I will start iron supplement, will leave blood thinners to primary team, cardiology.  Please call if she has overt bleeding, otherwise no plan for invasive testing for  now.   Owens Loffler, MD [facility] Pager [phone]

## 2014-07-09 NOTE — Progress Notes (Signed)
PROGRESS NOTE    [name] GEX:[id number] DOB: 07-22-1925 DOA: 07/04/2014 PCP: [name], MD  HPI/Brief narrative 80 year old female with nonobstructive CAD, diastolic CHF, permanent atrial fibrillation, CKD stage III, hyperlipidemia, hypothyroidism, and iron deficiency anemia was admitted on 07/04/2014 because of shortness of breath, orthopnea, and leg edema. She was admitted to the cardiology service for acute on chronic diastolic CHF. The patient was started on intravenous furosemide 40 mg IV every 12 hours. She has had a good clinical response. Dr. Rayann Heman has requested the hospitalist service assume care of patient due to patient's multiple co-morbidities.   Notably, the patient was recently discharged from the hospital on 06/30/2014 acute on chronic CHF. Her weight at the time of discharge was 69.9 kg. The patient was seen in her primary care provider's office on 07/03/2014 and refused admission. After being seen by her cardiologist on 07/04/2014, the patient agreed for admission.Patient was admitted 04/2014 x2 with acute on chronic diastolic CHF. She underwent cardioversion with restoration of NSR. This resulted in profound bradycardia and hypotension requiring pacemaker implantation. Pacemaker implant was complicated by acute blood loss anemia requiring transfusion with PRBCs. Her Eliquis was held for several days. She was discharged to SNF. Carvedilol was held secondary to hypotension. The apixiban has been since restarted. However, the patient reported melanotic stools prior to admission. She denies any hematochezia or hematemesis. She states that she was recently started on iron supplementation.  Patient status post EGD that showed gastritis. Colonoscopy is pending stabilization of patient's respiratory status. Cardiology had signed off 8/24 but re consulted 8/25 due to ongoing SOB 2/2 decompensated CHF    Assessment/Plan:  Iron deficiency anemia/melanotic stool  -The  patient's hemoglobin has gradually dropped over the past 2 months  -Iron saturation 3% with low ferritin--06/27/2014  -continue iron supplementation  - EGD 8/23 showed gastritis> FU Bx. Continue PPI.  -Plan for colonoscopy per GI for Thursday if ok with Cards -FOBT x2 negative. -eloquis on hold  acute on chronic diastolic CHF  -11/16/7251 after she was EF 60-65%,  -Discharge weight on 06/29/2014--69.9 kg  - Patient was initially treated with IV Lasix and then Lasix was discontinued by cardiology over the weekend. On 8/24, patient dyspneic and hypoxic, now back on IV lasix per Cards  Acute hypoxic respiratory failure - Secondary to decompensated CHF. Management as above.  atrial fibrillation  -Continue metoprolol tartrate and amiodarone  -Rate controlled  -Apixiban presently on hold pending GI evaluation   hypothyroidism  -Continue Synthroid  -TSH 2.760   Hyperlipidemia  -Continue statin   AoCKD stage III  -Since early July, baseline creatinine has been 1.2-1.5  -Continue to monitor with diuresis. Creatinine stable in the 1.5 range   Low back pain/upper back pain  -UA neg -lumbar xray- no acute findings. - On 8/4, patient complained of pain of shoulder blades overnight-resolved after pain medications. Monitor - Has not reported any further back pain.  Hypertension - Soft blood pressures: Monitor   Failure to thrive - As per Dr.Hongalgi's discussion with patient's daughter in [place], patient has been progressively declining with frequent hospitalizations over the last few months. She is going to discuss with her siblings in [place] regarding further course of action including palliative care consultation.     Code Status: Full Family Communication: none at bedside, Dr.Hongalgi d/w daughter regarding choosing aggressive vs Palliative approach, family to decide Disposition Plan: Home when medically stable   Consultants:  Cardiology  GI  Procedures:  EGD  8/23  Antibiotics:  None   Subjective: Complaints of some dizziness and dyspnea earlier, better now  Objective: Filed Vitals:   07/08/14 1400 07/08/14 2158 07/09/14 0439 07/09/14 0930  BP: 93/52 118/54 107/54 128/62  Pulse: 70 69 69 66  Temp: 98.2 F (36.8 C) 98.3 F (36.8 C) 97.5 F (36.4 C)   TempSrc: Oral Oral Oral   Resp: 18 18 18    Height:      Weight:   70.308 kg (155 lb)   SpO2: 99% 96% 98%      Intake/Output Summary (Last 24 hours) at 07/09/14 1019 Last data filed at 07/09/14 0902  Gross per 24 hour  Intake    970 ml  Output   1026 ml  Net    -56 ml   Filed Weights   07/07/14 0500 07/08/14 0455 07/09/14 0439  Weight: 70.4 kg (155 lb 3.3 oz) 70.1 kg (154 lb 8.7 oz) 70.308 kg (155 lb)     Exam:  General exam: Pleasant elderly frail female sitting up in chair without distress. Respiratory system: scant crackles at R base Cardiovascular system: S1 & S2 heard, RRR. No JVD, murmurs, gallops, clicks or pedal edema.  Gastrointestinal system: Abdomen is nondistended, soft and nontender. Normal bowel sounds heard. Central nervous system: Alert and oriented. No focal neurological deficits. Extremities: Symmetric 5 x 5 power. Musculoskeletal: No reproducible tenderness across scapula and no acute findings.   Data Reviewed: Basic Metabolic Panel:  Recent Labs Lab 07/04/14 1349 07/05/14 0147 07/06/14 0430 07/07/14 0440 07/08/14 0430 07/09/14 0444  NA 141 140 138 139 135* 136*  K 4.5 4.0 3.7 4.0 4.3 4.5  CL 100 102 98 100 98 99  CO2 28 24 29 28 27 27   GLUCOSE 117* 106* 103* 107* 108* 96  BUN 24* 23 25* 25* 23 25*  CREATININE 1.49* 1.55* 1.73* 1.53* 1.52* 1.44*  CALCIUM 9.9 9.0 9.0 9.5 9.5 9.6  MG 2.2  --   --   --   --   --    Liver Function Tests:  Recent Labs Lab 07/04/14 1349  AST 14  ALT 10  ALKPHOS 99  BILITOT 0.6  PROT 7.0  ALBUMIN 3.2*   No results found for this basename: LIPASE, AMYLASE,  in the last 168 hours No results found for  this basename: AMMONIA,  in the last 168 hours CBC:  Recent Labs Lab 07/04/14 1349 07/06/14 0430 07/07/14 0440 07/08/14 0430 07/09/14 0444  WBC 5.7 5.9 5.7 4.8 5.9  NEUTROABS 4.3  --   --   --   --   HGB 9.6* 9.1* 9.5* 8.7* 9.7*  HCT 31.3* 28.9* 30.4* 28.1* 31.3*  MCV 87.2 85.5 85.2 85.9 85.5  PLT 217 201 212 183 194   Cardiac Enzymes:  Recent Labs Lab 07/04/14 1349 07/04/14 2016 07/05/14 0147  TROPONINI <0.30 <0.30 <0.30   BNP (last 3 results)  Recent Labs  06/16/14 1237 06/27/14 1721 07/04/14 1349  PROBNP 543.0* 1950.0* 2180.0*   CBG: No results found for this basename: GLUCAP,  in the last 168 hours  No results found for this or any previous visit (from the past 240 hour(s)).        Studies: Dg Chest 2 View  07/07/2014   CLINICAL DATA:  Shortness of breath with several week history of sinus drainage  EXAM: CHEST  2 VIEW  COMPARISON:  PA and lateral chest x-ray of July 05, 2014  FINDINGS: The lungs are adequately inflated. The cardiopericardial silhouette is enlarged.  The left hemidiaphragm is obscured. The pulmonary vascularity is mildly prominent though stable. There is stable density lateral to the left heart border consistent with atelectasis or scarring. There is pleural fluid layering posteriorly on the left. The permanent pacemaker is in appropriate position. There is no acute bony thoracic abnormality.  IMPRESSION: Congestive heart failure with mild interstitial edema, left lower lobe atelectasis, and small left pleural effusion.   Electronically Signed   By: David  Martinique   On: 07/07/2014 16:01        Scheduled Meds: . amiodarone  200 mg Oral BID  . furosemide  40 mg Intravenous BID  . levothyroxine  75 mcg Oral QAC breakfast  . metoprolol tartrate  25 mg Oral Q12H  . pantoprazole  40 mg Oral Q0600  . polyethylene glycol  17 g Oral BID  . senna  2 tablet Oral Daily  . simvastatin  10 mg Oral q1800  . sodium chloride  3 mL Intravenous Q12H    Continuous Infusions:    Active Problems:   Hypothyroidism   Atrial fibrillation   Acute on chronic diastolic heart failure   Melena   Anemia, iron deficiency   CKD (chronic kidney disease) stage 3, GFR 30-59 ml/min   Other specified gastritis without mention of hemorrhage   Acute respiratory failure with hypoxia    Time spent: 25 minutes.    Domenic Polite, MD,  [facility] Pager [phone]  If 7PM-7AM, please contact night-coverage [email or web address] Password [email or web address] 07/09/2014, 10:19 AM    LOS: 5 days

## 2014-07-09 NOTE — Progress Notes (Signed)
Pt had clear liq at lunch and informed pt she can have something solid for lunch as diet has been changed by MD.  [name] ated "I would like just milk and chocolate ensure."  Gave milk and chocolate ensure as requested.  Will continue to [employer].  Karie Kirks, Therapist, sports.

## 2014-07-09 NOTE — Progress Notes (Signed)
Pt voice that she feel better.   Denies any sob.  Resting quietly in chair watching TV.  Karie Kirks, Therapist, sports.

## 2014-07-09 NOTE — Progress Notes (Signed)
Pt states "I feel a little short of breath."  02 sat 100% 2LN/C.  Lasix given as ordered.  Encourage to get back in bed with HOB elevated.  Pt refused and stated "I want to stay up in chair.  Extra pillow added to her back .  Pt voiced " feels little better."   Will continue to monitor.  Maleya Leever, RN

## 2014-07-09 NOTE — Progress Notes (Signed)
Pt made aware that Dr. Edison Nasuti, the GI MD is coming to talk to her about colonoscopy test to do.  Not likely going to do test and pt's daughter Joseph Art notified.  Also and verbalized understanding.  Karie Kirks, Therapist, sports.

## 2014-07-09 NOTE — Care Management Note (Addendum)
    Page 1 of 2   07/14/2014     2:21:10 PM CARE MANAGEMENT NOTE 07/14/2014  Patient:  [name], [name]   Account Number:  [id number]  Date Initiated:  07/09/2014  Documentation initiated by:  [facility]  Subjective/Objective Assessment:   80 y.o. female with the history of nonobstructive CAD by cardiac cath in 01/2014, persistent atrial fibrillation, diastolic CHF, HTN, HL, CKD. Admit with CHF symptoms - most likely driven by her anemia and AF.//Home alone     Action/Plan:   amiodarone - may need to consider repeat cardioversion since she is on amiodarone -d/c Eliquis; IV diureses.//Access for [facility] services.   Anticipated DC Date:  07/10/2014   Anticipated DC Plan:  [place]  In-house referral  Hospice / [place]  CM consult  HF Clinic      [facility] Choice  HOME HEALTH  DURABLE MEDICAL EQUIPMENT   Choice offered to / List presented to:  C-4 Adult Children   DME arranged  HOSPITAL BED  [place]      DME agency  [place] arranged  HH-1 RN  HH-10 DISEASE MANAGEMENT  HH-2 PT  HH-3 OT      [place] agency  [facility] of the [place]   Status of service:  Completed, signed off Medicare Important Message given?  YES (If response is "NO", the following Medicare IM given date fields will be blank) Date Medicare IM given:  07/11/2014 Medicare IM given by:  [facility] Date Additional Medicare IM given:  07/14/2014 Additional Medicare IM given by:  Zeynab Klett  Discharge Disposition:  [place]  Per UR Regulation:  Reviewed for med. necessity/level of care/duration of stay  If discussed at [place] of Stay Meetings, dates discussed:   07/09/2014    Comments:  07/14/14 [place], RN, BSN, [place] [phone] Copy of Medicare Important Message given to pt: 07/09/14 by CJW.  07/13/14 15:12 PER MD ([place])'s request, CM gave pt Private Duty Agency List in  room.  Pt states when she is discharged she will go home with [facility] as her home health agency.   No other CM needs at this time.  Mariane Masters, BSN, [place].  07/11/14 [place], RN, BSN, [place] [phone] Orders taking from Dr. Hilma Favors to order: DME hospital bed and oxygen (ordered through [place]);  Home Palliative Care via [place]; [facility] referral (not eligible r/t pt insurance); HF Clinic referral ([facility] HF navigator contacted and made appt with pt).  07/09/14 Fuller Mandril, RN, BSN, NCM [phone] CM spoke to pt in ref to [facility] services. Pt states she is active with [place]. CM called [facility] to clarify services. Pt has RN, PT and OT services.  Request for resumption of care orders placed.

## 2014-07-09 NOTE — Progress Notes (Signed)
Pt's daughter called and wanted to know if pt still getting colonoscopy.  Dr. Broadus John notified and asked that I call Jessica with GI.  Spoke with Janett Billow and informed nurse that Dr. Edison Nasuti will be coming to see pt to talk to her as she likely not having test done.  Also stated she will put pt back on previous diet.  Also made Dr. Broadus John aware that daughter has been asking about palliative care.  Will continue to monitor.  Terence Googe,RN.

## 2014-07-09 NOTE — Progress Notes (Signed)
Addendum, Called and d/w daughter Sherron Monday in [place], we discussed clinical situation, recurrent hospitalizations and she would like to proceed with Palliative Consult for Goals of care, she d/w GI as well and Colonoscopy cancelled at this time. -I have ordered a Palliative consult and left msg with Palliative coordinator  Domenic Polite, MD [phone]

## 2014-07-09 NOTE — Progress Notes (Signed)
    SUBJECTIVE:  Mild SOB still.   PHYSICAL EXAM Filed Vitals:   07/08/14 1025 07/08/14 1400 07/08/14 2158 07/09/14 0439  BP:  93/52 118/54 107/54  Pulse:  70 69 69  Temp:  98.2 F (36.8 C) 98.3 F (36.8 C) 97.5 F (36.4 C)  TempSrc:  Oral Oral Oral  Resp:  18 18 18   Height:      Weight:    155 lb (70.308 kg)  SpO2: 100% 99% 96% 98%   General:  No distress Lungs:  Increased basilar crackles. Heart:  Irregular Abdomen:  Positive bowel sounds, no rebound no guarding Extremities:  No edema   LABS:  Results for orders placed during the hospital encounter of 07/04/14 (from the past 24 hour(s))  CBC     Status: Abnormal   Collection Time    07/09/14  4:44 AM      Result Value Ref Range   WBC 5.9  4.0 - 10.5 K/uL   RBC 3.66 (*) 3.87 - 5.11 MIL/uL   Hemoglobin 9.7 (*) 12.0 - 15.0 g/dL   HCT 31.3 (*) 36.0 - 46.0 %   MCV 85.5  78.0 - 100.0 fL   MCH 26.5  26.0 - 34.0 pg   MCHC 31.0  30.0 - 36.0 g/dL   RDW 16.1 (*) 11.5 - 15.5 %   Platelets 194  150 - 400 K/uL  BASIC METABOLIC PANEL     Status: Abnormal   Collection Time    07/09/14  4:44 AM      Result Value Ref Range   Sodium 136 (*) 137 - 147 mEq/L   Potassium 4.5  3.7 - 5.3 mEq/L   Chloride 99  96 - 112 mEq/L   CO2 27  19 - 32 mEq/L   Glucose, Bld 96  70 - 99 mg/dL   BUN 25 (*) 6 - 23 mg/dL   Creatinine, Ser 1.44 (*) 0.50 - 1.10 mg/dL   Calcium 9.6  8.4 - 10.5 mg/dL   GFR calc non Af Amer 32 (*) >90 mL/min   GFR calc Af Amer 37 (*) >90 mL/min   Anion gap 10  5 - 15    Intake/Output Summary (Last 24 hours) at 07/09/14 0908 Last data filed at 07/09/14 1478  Gross per 24 hour  Intake    970 ml  Output   1026 ml  Net    -56 ml    ASSESSMENT AND PLAN:  Acute diastolic heart failure:  Please see my comments from yesterday.    She will need O2 at discharge.  Today she seems to have increased crackles on exam.  I will continue IV diuresis and increase slightly the dose.   Anemia, iron deficiency:  Upper GI with  gastritis.    Atrial fib:    Plan to try to get her back into sinus in the future when she has been restarted on anticoagulation.  Continue amio and beta blocker as rate control.  Discontinue telemetry.      Jeneen Rinks [facility] 07/09/2014 9:08 AM

## 2014-07-10 ENCOUNTER — Encounter (HOSPITAL_COMMUNITY)
Admission: AD | Disposition: A | Discharge status: Home Health | Payer: Medicare Other | Source: Ambulatory Visit | Attending: Internal Medicine

## 2014-07-10 ENCOUNTER — Ambulatory Visit: Payer: Medicare Other | Admitting: Internal Medicine

## 2014-07-10 LAB — CBC
HEMATOCRIT: 33.3 % — AB (ref 36.0–46.0)
Hemoglobin: 10.1 g/dL — ABNORMAL LOW (ref 12.0–15.0)
MCH: 26.5 pg (ref 26.0–34.0)
MCHC: 30.3 g/dL (ref 30.0–36.0)
MCV: 87.4 fL (ref 78.0–100.0)
Platelets: 222 10*3/uL (ref 150–400)
RBC: 3.81 MIL/uL — ABNORMAL LOW (ref 3.87–5.11)
RDW: 16.2 % — ABNORMAL HIGH (ref 11.5–15.5)
WBC: 5.3 10*3/uL (ref 4.0–10.5)

## 2014-07-10 LAB — BASIC METABOLIC PANEL
Anion gap: 12 (ref 5–15)
BUN: 24 mg/dL — AB (ref 6–23)
CO2: 29 mEq/L (ref 19–32)
Calcium: 9.4 mg/dL (ref 8.4–10.5)
Chloride: 96 mEq/L (ref 96–112)
Creatinine, Ser: 1.4 mg/dL — ABNORMAL HIGH (ref 0.50–1.10)
GFR calc non Af Amer: 33 mL/min — ABNORMAL LOW (ref >90)
GFR, EST AFRICAN AMERICAN: 38 mL/min — AB (ref >90)
Glucose, Bld: 97 mg/dL (ref 70–99)
POTASSIUM: 4.2 meq/L (ref 3.7–5.3)
Sodium: 137 mEq/L (ref 137–147)

## 2014-07-10 SURGERY — COLONOSCOPY
Anesthesia: Moderate Sedation

## 2014-07-10 MED ORDER — APIXABAN 2.5 MG PO TABS
2.5000 mg | ORAL_TABLET | Freq: Two times a day (BID) | ORAL | Status: DC
  Administered 2014-07-10 – 2014-07-14 (×9): 2.5 mg via ORAL
  Filled 2014-07-10 (×13): qty 1

## 2014-07-10 NOTE — Progress Notes (Signed)
Utilization Review Completed Adali Pennings J. Zannah Melucci, RN, BSN, NCM [phone]  

## 2014-07-10 NOTE — Progress Notes (Signed)
Palliative Medicine Consult received. Spoke with patient's daughter [name] by phone. High levels of anxiety about Palliative care but once I explained our role and teh nature of the conversation she was very open to meeting. [name] is going to coordinate with her other sister [name] who will be present and we will phone in patients other daughter who is in [place]. Patient is able to participate in her own goals of care. Will meet them tomorrow at 11AM per family request. Full consult to follow.  Lane Hacker, DO Palliative Medicine

## 2014-07-10 NOTE — Progress Notes (Signed)
PROGRESS NOTE    BRANDE UNCAPHER CVE:[id number] DOB: 12-19-1924 DOA: 07/04/2014 PCP: [name], MD  HPI/Brief narrative 80 year old female with nonobstructive CAD, diastolic CHF, permanent atrial fibrillation, CKD stage III, hyperlipidemia, hypothyroidism, and iron deficiency anemia was admitted on 07/04/2014 because of shortness of breath, orthopnea, and leg edema. She was admitted to the cardiology service for acute on chronic diastolic CHF. The patient was started on intravenous furosemide 40 mg IV every 12 hours. She has had a good clinical response. Dr. Rayann Heman has requested the hospitalist service assume care of patient due to patient's multiple co-morbidities.   Notably, the patient was recently discharged from the hospital on 06/30/2014 acute on chronic CHF. Her weight at the time of discharge was 69.9 kg. The patient was seen in her primary care provider's office on 07/03/2014 and refused admission. After being seen by her cardiologist on 07/04/2014, the patient agreed for admission.Patient was admitted 04/2014 x2 with acute on chronic diastolic CHF. She underwent cardioversion with restoration of NSR. This resulted in profound bradycardia and hypotension requiring pacemaker implantation. Pacemaker implant was complicated by acute blood loss anemia requiring transfusion with PRBCs. Her Eliquis was held for several days. She was discharged to SNF. Carvedilol was held secondary to hypotension. The apixiban has been since restarted. However, the patient reported melanotic stools prior to admission. She denies any hematochezia or hematemesis. She states that she was recently started on iron supplementation.  Patient status post EGD that showed gastritis. Colonoscopy cancelled. Cardiology had signed off 8/24 but re consulted 8/25 due to ongoing SOB 2/2 decompensated CHF  Palliative consulted, colonoscopy cancelled  Assessment/Plan:  Iron deficiency anemia/melanotic stool  -The  patient's hemoglobin has gradually dropped over the past 2 months  -Iron saturation 3% with low ferritin--06/27/2014  -continue iron supplementation  - EGD 8/23 showed gastritis, Continue PPI.  -Initially plans for colonoscopy per GI for Thursday, but this has been cancelled after d/w family -FOBT x2 negative. -eloquis resumed  Acute on chronic diastolic CHF  -ECHo EF 02-58%,  - Patient was initially treated with IV Lasix and then Lasix was discontinued over the weekend. On 8/24, patient dyspneic and hypoxic, now back on IV lasix per Cards  Acute hypoxic respiratory failure - Secondary to decompensated CHF. Management as above.  atrial fibrillation  -Continue metoprolol tartrate and amiodarone  -Rate controlled  -Apixiban resumed today  hypothyroidism  -Continue Synthroid  -TSH 2.760   Hyperlipidemia  -Continue statin   AoCKD stage III  -Since early July, baseline creatinine has been 1.2-1.5  -Continue to monitor with diuresis. Creatinine stable in the 1.5 range   Low back pain/upper back pain  -UA neg -lumbar xray- no acute findings. - On 8/4, patient complained of pain of shoulder blades overnight-resolved after pain medications. Monitor - Has not reported any further back pain.  Hypertension - Soft blood pressures: Monitor   Failure to thrive - After Dr.Hongalgi's and my discussion with daughter Sherron Monday in [facility], patient has been progressively declining with frequent hospitalizations over the last few months.  -Family requested a Palliative meeting and this is pending at this time  Code Status: Full Family Communication: d/w daughter Ms.Spong via telephone 8/27 Disposition Plan: Home possibly with hospice soon   Consultants:  Cardiology  GI  Palliative  Procedures:  EGD 8/23  Antibiotics:  None   Subjective: Complaints of some nausea, improved now, breathing better now  Objective: Filed Vitals:   07/09/14 1429 07/09/14 2056 07/10/14 5277  07/10/14 1107  BP: 99/57 97/63 109/60 104/64  Pulse: 70 71 73 76  Temp: 98.4 F (36.9 C) 97.3 F (36.3 C) 97.6 F (36.4 C)   TempSrc: Oral Oral Oral   Resp: 18 17 18    Height:      Weight:   70.625 kg (155 lb 11.2 oz)   SpO2: 97% 100% 99%      Intake/Output Summary (Last 24 hours) at 07/10/14 1142 Last data filed at 07/10/14 1053  Gross per 24 hour  Intake    790 ml  Output   1651 ml  Net   -861 ml   Filed Weights   07/08/14 0455 07/09/14 0439 07/10/14 0442  Weight: 70.1 kg (154 lb 8.7 oz) 70.308 kg (155 lb) 70.625 kg (155 lb 11.2 oz)     Exam:  General exam: Pleasant elderly frail female sitting up in chair without distress. Respiratory system: scant crackles at R base Cardiovascular system: S1 & S2 heard, RRR. No JVD, murmurs, gallops, clicks or pedal edema.  Gastrointestinal system: Abdomen is nondistended, soft and nontender. Normal bowel sounds heard. Central nervous system: Alert and oriented. No focal neurological deficits. Extremities: Symmetric 5 x 5 power. Musculoskeletal: No reproducible tenderness across scapula and no acute findings.   Data Reviewed: Basic Metabolic Panel:  Recent Labs Lab 07/04/14 1349  07/06/14 0430 07/07/14 0440 07/08/14 0430 07/09/14 0444 07/10/14 0806  NA 141  < > 138 139 135* 136* 137  K 4.5  < > 3.7 4.0 4.3 4.5 4.2  CL 100  < > 98 100 98 99 96  CO2 28  < > 29 28 27 27 29   GLUCOSE 117*  < > 103* 107* 108* 96 97  BUN 24*  < > 25* 25* 23 25* 24*  CREATININE 1.49*  < > 1.73* 1.53* 1.52* 1.44* 1.40*  CALCIUM 9.9  < > 9.0 9.5 9.5 9.6 9.4  MG 2.2  --   --   --   --   --   --   < > = values in this interval not displayed. Liver Function Tests:  Recent Labs Lab 07/04/14 1349  AST 14  ALT 10  ALKPHOS 99  BILITOT 0.6  PROT 7.0  ALBUMIN 3.2*   No results found for this basename: LIPASE, AMYLASE,  in the last 168 hours No results found for this basename: AMMONIA,  in the last 168 hours CBC:  Recent Labs Lab  07/04/14 1349 07/06/14 0430 07/07/14 0440 07/08/14 0430 07/09/14 0444 07/10/14 0806  WBC 5.7 5.9 5.7 4.8 5.9 5.3  NEUTROABS 4.3  --   --   --   --   --   HGB 9.6* 9.1* 9.5* 8.7* 9.7* 10.1*  HCT 31.3* 28.9* 30.4* 28.1* 31.3* 33.3*  MCV 87.2 85.5 85.2 85.9 85.5 87.4  PLT 217 201 212 183 194 222   Cardiac Enzymes:  Recent Labs Lab 07/04/14 1349 07/04/14 2016 07/05/14 0147  TROPONINI <0.30 <0.30 <0.30   BNP (last 3 results)  Recent Labs  06/16/14 1237 06/27/14 1721 07/04/14 1349  PROBNP 543.0* 1950.0* 2180.0*   CBG: No results found for this basename: GLUCAP,  in the last 168 hours  No results found for this or any previous visit (from the past 240 hour(s)).        Studies: No results found.      Scheduled Meds: . amiodarone  200 mg Oral BID  . apixaban  2.5 mg Oral BID  . ferrous sulfate  325 mg Oral Q  breakfast  . furosemide  40 mg Intravenous BID  . levothyroxine  75 mcg Oral QAC breakfast  . metoprolol tartrate  25 mg Oral Q12H  . pantoprazole  40 mg Oral Q0600  . polyethylene glycol  17 g Oral BID  . senna  2 tablet Oral Daily  . simvastatin  10 mg Oral q1800  . sodium chloride  3 mL Intravenous Q12H   Continuous Infusions:    Active Problems:   Hypothyroidism   Atrial fibrillation   Acute on chronic diastolic heart failure   Melena   Anemia, iron deficiency   CKD (chronic kidney disease) stage 3, GFR 30-59 ml/min   Other specified gastritis without mention of hemorrhage   Acute respiratory failure with hypoxia    Time spent: 25 minutes.    Domenic Polite, MD,  [facility] Pager [phone]  If 7PM-7AM, please contact night-coverage [email or web address] Password [email or web address] 07/10/2014, 11:42 AM    LOS: 6 days

## 2014-07-10 NOTE — Progress Notes (Signed)
    SUBJECTIVE:  Mild SOB still but improved.     PHYSICAL EXAM Filed Vitals:   07/09/14 1429 07/09/14 2056 07/10/14 0442 07/10/14 1107  BP: 99/57 97/63 109/60 104/64  Pulse: 70 71 73 76  Temp: 98.4 F (36.9 C) 97.3 F (36.3 C) 97.6 F (36.4 C)   TempSrc: Oral Oral Oral   Resp: 18 17 18    Height:      Weight:   155 lb 11.2 oz (70.625 kg)   SpO2: 97% 100% 99%    General:  No distress Lungs:  Decreased basilar crackles. Heart:  Irregular Abdomen:  Positive bowel sounds, no rebound no guarding Extremities:  Mild edema  Neuro:  Nonfocal  LABS:  Results for orders placed during the hospital encounter of 07/04/14 (from the past 24 hour(s))  CBC     Status: Abnormal   Collection Time    07/10/14  8:06 AM      Result Value Ref Range   WBC 5.3  4.0 - 10.5 K/uL   RBC 3.81 (*) 3.87 - 5.11 MIL/uL   Hemoglobin 10.1 (*) 12.0 - 15.0 g/dL   HCT 33.3 (*) 36.0 - 46.0 %   MCV 87.4  78.0 - 100.0 fL   MCH 26.5  26.0 - 34.0 pg   MCHC 30.3  30.0 - 36.0 g/dL   RDW 16.2 (*) 11.5 - 15.5 %   Platelets 222  150 - 400 K/uL  BASIC METABOLIC PANEL     Status: Abnormal   Collection Time    07/10/14  8:06 AM      Result Value Ref Range   Sodium 137  137 - 147 mEq/L   Potassium 4.2  3.7 - 5.3 mEq/L   Chloride 96  96 - 112 mEq/L   CO2 29  19 - 32 mEq/L   Glucose, Bld 97  70 - 99 mg/dL   BUN 24 (*) 6 - 23 mg/dL   Creatinine, Ser 1.40 (*) 0.50 - 1.10 mg/dL   Calcium 9.4  8.4 - 10.5 mg/dL   GFR calc non Af Amer 33 (*) >90 mL/min   GFR calc Af Amer 38 (*) >90 mL/min   Anion gap 12  5 - 15    Intake/Output Summary (Last 24 hours) at 07/10/14 1118 Last data filed at 07/10/14 1053  Gross per 24 hour  Intake    790 ml  Output   1651 ml  Net   -861 ml    ASSESSMENT AND PLAN:  Acute diastolic heart failure:  She will need O2 at discharge.  Today she seems to have increased crackles on exam.  I will continue IV diuresis again today.  Palliative care consult noted.   Anemia, iron deficiency:   Upper GI with gastritis.  GI note noted with permission to restart blood thinner.  See below.    Atrial fib:    Plan to try to get her back into sinus in the future when she has been restarted on anticoagulation.  Continue amio and beta blocker as rate control.  I will restart the Eliquis.  No plans for colonoscopy.  The patient is aware of the risk of bleeding and the need for close lab follow up.  I would still suggest plan for DCCV if she can maintain 3 weeks of Eliquis.  This might help her with her diastolic HF.     Jeneen Rinks [facility] 07/10/2014 11:18 AM

## 2014-07-11 DIAGNOSIS — I5023 Acute on chronic systolic (congestive) heart failure: Secondary | ICD-10-CM

## 2014-07-11 DIAGNOSIS — Z515 Encounter for palliative care: Secondary | ICD-10-CM

## 2014-07-11 LAB — CBC
HCT: 30.3 % — ABNORMAL LOW (ref 36.0–46.0)
Hemoglobin: 9.3 g/dL — ABNORMAL LOW (ref 12.0–15.0)
MCH: 26.8 pg (ref 26.0–34.0)
MCHC: 30.7 g/dL (ref 30.0–36.0)
MCV: 87.3 fL (ref 78.0–100.0)
Platelets: 201 10*3/uL (ref 150–400)
RBC: 3.47 MIL/uL — AB (ref 3.87–5.11)
RDW: 16.3 % — ABNORMAL HIGH (ref 11.5–15.5)
WBC: 5.5 10*3/uL (ref 4.0–10.5)

## 2014-07-11 MED ORDER — FUROSEMIDE 40 MG PO TABS
40.0000 mg | ORAL_TABLET | Freq: Two times a day (BID) | ORAL | Status: DC
  Administered 2014-07-11 – 2014-07-13 (×4): 40 mg via ORAL
  Filled 2014-07-11 (×6): qty 1

## 2014-07-11 NOTE — Progress Notes (Signed)
PROGRESS NOTE    [name] HQP:[id number] DOB: 1925-09-29 DOA: 07/04/2014 PCP: [name], MD  HPI/Brief narrative 80 year old female with nonobstructive CAD, diastolic CHF, permanent atrial fibrillation, CKD stage III, hyperlipidemia, hypothyroidism, and iron deficiency anemia was admitted on 07/04/2014 because of shortness of breath, orthopnea, and leg edema. She was admitted to the cardiology service for acute on chronic diastolic CHF. The patient was started on intravenous furosemide 40 mg IV every 12 hours. She has had a good clinical response. Dr. Rayann Heman has requested the hospitalist service assume care of patient due to patient's multiple co-morbidities.   Notably, the patient was recently discharged from the hospital on 06/30/2014 acute on chronic CHF. Her weight at the time of discharge was 69.9 kg. The patient was seen in her primary care provider's office on 07/03/2014 and refused admission. After being seen by her cardiologist on 07/04/2014, the patient agreed for admission.Patient was admitted 04/2014 x2 with acute on chronic diastolic CHF. She underwent cardioversion with restoration of NSR. This resulted in profound bradycardia and hypotension requiring pacemaker implantation. Pacemaker implant was complicated by acute blood loss anemia requiring transfusion with PRBCs. Her Eliquis was held for several days. She was discharged to SNF. Carvedilol was held secondary to hypotension. The apixiban has been since restarted. However, the patient reported melanotic stools prior to admission. She denies any hematochezia or hematemesis. She states that she was recently started on iron supplementation.  Patient status post EGD that showed gastritis. Colonoscopy cancelled. Cardiology had signed off 8/24 but re consulted 8/25 due to ongoing SOB 2/2 decompensated CHF  Palliative consulted, colonoscopy cancelled  Assessment/Plan:  Iron deficiency anemia/melanotic stool  -The  patient's hemoglobin has gradually dropped over the past 2 months  -Iron saturation 3% with low ferritin--06/27/2014  -continue iron supplementation  -EGD 8/23 showed gastritis, Continue PPI.  -Initially plans for colonoscopy per GI for Thursday, but this has been cancelled after d/w family -FOBT x2 negative. -eloquis resumed, Hb stable in 9 range  Acute on chronic diastolic CHF  -ECHo EF 59-93%,  - Patient was initially treated with IV Lasix and then Lasix was discontinued over the weekend. On 8/24, patient dyspneic and hypoxic, now back on IV lasix per Cards, still with crackles, weight unchanged despite diuresis  Acute hypoxic respiratory failure - Secondary to decompensated CHF. Management as above.  atrial fibrillation  -Continue metoprolol tartrate and amiodarone  -Rate controlled  -Apixiban resumed  hypothyroidism  -Continue Synthroid  -TSH 2.760   Hyperlipidemia  -Continue statin   AoCKD stage III  -Since early July, baseline creatinine has been 1.2-1.5  -Continue to monitor with diuresis. Creatinine stable in the 1.5 range   Low back pain/upper back pain  -UA neg -lumbar xray- no acute findings. - On 8/4, patient complained of pain of shoulder blades overnight-resolved after pain medications. Monitor - Has not reported any further back pain.  Hypertension - Soft blood pressures: Monitor   Failure to thrive - based on discusson with daughter Sherron Monday in [facility], patient has been progressively declining with frequent hospitalizations over the last few months.  -Palliative meeting at 11am today  Code Status: Full Family Communication: d/w daughter Ms.Spong via telephone 8/27 Disposition Plan: Home possibly with hospice soon   Consultants:  Cardiology  GI  Palliative  Procedures:  EGD 8/23  Antibiotics:  None   Subjective: No new complaints, some intermittent dyspnea  Objective: Filed Vitals:   07/10/14 1714 07/10/14 2100 07/11/14 0453  07/11/14 0945  BP: 126/61  100/62 102/55 120/49  Pulse: 70 72 70 68  Temp:  98.1 F (36.7 C) 97.8 F (36.6 C)   TempSrc:  Oral Oral   Resp:  18 18   Height:      Weight:   70.171 kg (154 lb 11.2 oz)   SpO2:  99% 97%      Intake/Output Summary (Last 24 hours) at 07/11/14 1258 Last data filed at 07/11/14 1057  Gross per 24 hour  Intake    720 ml  Output    950 ml  Net   -230 ml   Filed Weights   07/09/14 0439 07/10/14 0442 07/11/14 0453  Weight: 70.308 kg (155 lb) 70.625 kg (155 lb 11.2 oz) 70.171 kg (154 lb 11.2 oz)     Exam:  General exam: Pleasant elderly frail female sitting up in chair without distress. Respiratory system: fine crackles at R base Cardiovascular system: S1 & S2 heard, RRR. No JVD, murmurs, gallops, clicks or pedal edema.  Gastrointestinal system: Abdomen is nondistended, soft and nontender. Normal bowel sounds heard. Central nervous system: Alert and oriented. No focal neurological deficits. Extremities: Symmetric 5 x 5 power. Musculoskeletal: No reproducible tenderness across scapula and no acute findings.   Data Reviewed: Basic Metabolic Panel:  Recent Labs Lab 07/04/14 1349  07/06/14 0430 07/07/14 0440 07/08/14 0430 07/09/14 0444 07/10/14 0806  NA 141  < > 138 139 135* 136* 137  K 4.5  < > 3.7 4.0 4.3 4.5 4.2  CL 100  < > 98 100 98 99 96  CO2 28  < > 29 28 27 27 29   GLUCOSE 117*  < > 103* 107* 108* 96 97  BUN 24*  < > 25* 25* 23 25* 24*  CREATININE 1.49*  < > 1.73* 1.53* 1.52* 1.44* 1.40*  CALCIUM 9.9  < > 9.0 9.5 9.5 9.6 9.4  MG 2.2  --   --   --   --   --   --   < > = values in this interval not displayed. Liver Function Tests:  Recent Labs Lab 07/04/14 1349  AST 14  ALT 10  ALKPHOS 99  BILITOT 0.6  PROT 7.0  ALBUMIN 3.2*   No results found for this basename: LIPASE, AMYLASE,  in the last 168 hours No results found for this basename: AMMONIA,  in the last 168 hours CBC:  Recent Labs Lab 07/04/14 1349  07/07/14 0440  07/08/14 0430 07/09/14 0444 07/10/14 0806 07/11/14 0549  WBC 5.7  < > 5.7 4.8 5.9 5.3 5.5  NEUTROABS 4.3  --   --   --   --   --   --   HGB 9.6*  < > 9.5* 8.7* 9.7* 10.1* 9.3*  HCT 31.3*  < > 30.4* 28.1* 31.3* 33.3* 30.3*  MCV 87.2  < > 85.2 85.9 85.5 87.4 87.3  PLT 217  < > 212 183 194 222 201  < > = values in this interval not displayed. Cardiac Enzymes:  Recent Labs Lab 07/04/14 1349 07/04/14 2016 07/05/14 0147  TROPONINI <0.30 <0.30 <0.30   BNP (last 3 results)  Recent Labs  06/16/14 1237 06/27/14 1721 07/04/14 1349  PROBNP 543.0* 1950.0* 2180.0*   CBG: No results found for this basename: GLUCAP,  in the last 168 hours  No results found for this or any previous visit (from the past 240 hour(s)).        Studies: No results found.      Scheduled Meds: .  amiodarone  200 mg Oral BID  . apixaban  2.5 mg Oral BID  . ferrous sulfate  325 mg Oral Q breakfast  . furosemide  40 mg Oral BID  . levothyroxine  75 mcg Oral QAC breakfast  . metoprolol tartrate  25 mg Oral Q12H  . pantoprazole  40 mg Oral Q0600  . polyethylene glycol  17 g Oral BID  . senna  2 tablet Oral Daily  . simvastatin  10 mg Oral q1800  . sodium chloride  3 mL Intravenous Q12H   Continuous Infusions:    Active Problems:   Hypothyroidism   Atrial fibrillation   Acute on chronic diastolic heart failure   Melena   Anemia, iron deficiency   CKD (chronic kidney disease) stage 3, GFR 30-59 ml/min   Other specified gastritis without mention of hemorrhage   Acute respiratory failure with hypoxia    Time spent: 25 minutes.    Domenic Polite, MD,  [facility] Pager [phone]  If 7PM-7AM, please contact night-coverage [email or web address] Password [facility] 07/11/2014, 12:58 PM    LOS: 7 days

## 2014-07-11 NOTE — Progress Notes (Signed)
Heart Failure Navigator Consult Note  Presentation: [name] is a 80 y.o. female with the history of nonobstructive CAD by cardiac catheterization in 01/2014, persistent atrial fibrillation, diastolic CHF, HTN, HL, CKD.  Patient was admitted 04/2014 x2 with acute on chronic diastolic CHF. She underwent cardioversion with restoration of NSR. This resulted in profound bradycardia and hypotension requiring pacemaker implantation. Pacemaker implant was complicated by acute blood loss anemia requiring transfusion with PRBCs. Her Eliquis was held for several days. She was discharged to SNF. Carvedilol was held secondary to hypotension.  Review of hospital records demonstrates that she was in NSR at d/c. She was seen in the emergency room 6/25 with chest discomfort. ECG at that time demonstrated atrial fibrillation. Has been back here and saw Richardson Dopp, [place] in July and then Dr. Harrington Challenger earlier this August. Difficult to say if she felt better in sinus - Dr. Harrington Challenger elected to leave her in AF with rate control and anticoagulation. Has been placed on amiodarone - more for rate control.  Most recently in the hospital and discharged this past Monday with shortness of breath, weight gain and chest pain. Saw a covering MD for Dr. Regis Bill yesterday - more anemic. More heart failure symptoms. Poor rate control with her AF. She refused admission.  Comes back today for evaluation. Here with her daughter. She remains short of breath. 2 pillow orthopnea. Still with swelling. Anxious and air hungry at times. Had a normal hemoglobin a month ago. Her stools are quite dark - has just started iron. No BM today. Some chest heaviness as well. She feels her heart racing.   Past Medical History  Diagnosis Date  . Dyslipidemia   . Hypertension   . Palpitations     PVCs/bigeminy on event in May 2009 revealing this was relatively asymptomatic  . Chest pain     a. 2011 Neg MV;  b. 01/2014 Cath: LM 20-30, LAD nl, D1 nl, LCX nl,  OM1 nl, RCA dom 30-47m, PD/PL nl, EF 65%->Med Rx.  . Degenerative joint disease   . Hx of varicella   . History of skin cancer     tafeen/ non melanoma.   . Thrombocytopenia   . Anemia   . Monoclonal gammopathies   . Right lower quadrant abdominal pain 07/19/2012    Recurrent  With nausea    This is new since she had ct for llq pain in [facility]    R/o appendiceal problem  Hernia  Get ct scan  And plan  Fu     . Diastolic CHF, acute on chronic 01/31/2012  . Atrial fibrillation     a. Dx 01/2014->Eliquis started.  . Cancer   . Pacemaker   . Dysrhythmia     History   Social History  . Marital Status: Widowed    Spouse Name: N/A    Number of Children: 3  . Years of Education: N/A   Occupational History  .      [employer], book keeping   Social History Main Topics  . Smoking status: Never Smoker   . Smokeless tobacco: Never Used  . Alcohol Use: No  . Drug Use: No  . Sexual Activity: Not Currently   Other Topics Concern  . 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 Eats with her.    ECHO:Study Conclusions  - Left ventricle: The cavity size was normal. Wall thickness was increased in a pattern of mild LVH. There was focal basal hypertrophy. Systolic function was normal. The estimated ejection fraction was in the range of 60% to 65%. Wall motion was normal; there were no regional wall motion abnormalities. - Left atrium: The atrium was mildly dilated. - Right atrium: The atrium was mildly dilated. - Pericardium, extracardiac: A trivial pericardial effusion was identified. Transthoracic echocardiography. M-mode, complete 2D, spectral Doppler, and color Doppler. Height: Height: 142.2cm. Height: 56in. Weight: Weight: 68.5kg. Weight: 150.7lb. Body  mass index: BMI: 33.9kg/m^2. Body surface area: BSA: 1.37m^2. Blood pressure: 97/42. Patient status: Inpatient. Location: Bedside.   BNP    Component Value Date/Time   PROBNP 2180.0* 07/04/2014 1349    Education Assessment and Provision:  Detailed education and instructions provided on heart failure disease management including the following:  Signs and symptoms of Heart Failure When to call the physician Importance of daily weights Low sodium diet Fluid restriction Medication management Anticipated future follow-up appointments  Patient education given on each of the above topics.  Patient acknowledges understanding and acceptance of all instructions.  Ms. Halleck and I spent some time discussing HF.  She is says that she went home from prior hosp admission with [facility] Go and that she was aware of weight gain since last admission--she communicated that with [facility] RN.   She does weigh daily.  She usually eats breakfast at home --cold cereal, then she eats out for lunch and her daughter makes supper for her.  Her daughter will be available to transport her to and from appointments.  Education Materials:  "Living Better With Heart Failure" Booklet, Daily Weight Tracker Tool .   High Risk Criteria for Readmission and/or Poor Patient Outcomes:  (Recommend Follow-up with Advanced Heart Failure Clinic)--Yes   EF <30%- No--60-65%  2 or more admissions in 6 months- Yes--6 adm in 6 mo  Difficult social situation- No--Lives alone however 2 daughters local  Demonstrates medication noncompliance- No--daughter helps with pill box   Barriers of Care:  Knowledge of all family members, compliance  Discharge Planning:   Plans to discharge to home alone with [facility] and daughter 4 min away.  She was referred by Dr. Broadus John and Dr. Hilma Favors to outpatient follow-up in the AHF clinic.

## 2014-07-11 NOTE — Progress Notes (Signed)
    SUBJECTIVE:  Mild SOB still but improved.     PHYSICAL EXAM Filed Vitals:   07/10/14 1714 07/10/14 2100 07/11/14 0453 07/11/14 0945  BP: 126/61 100/62 102/55 120/49  Pulse: 70 72 70 68  Temp:  98.1 F (36.7 C) 97.8 F (36.6 C)   TempSrc:  Oral Oral   Resp:  18 18   Height:      Weight:   154 lb 11.2 oz (70.171 kg)   SpO2:  99% 97%    General:  No distress Lungs:  Decreased basilar crackles. Heart:  Irregular Abdomen:  Positive bowel sounds, no rebound no guarding Extremities:  Mild edema  Neuro:  Nonfocal  LABS:  Results for orders placed during the hospital encounter of 07/04/14 (from the past 24 hour(s))  CBC     Status: Abnormal   Collection Time    07/11/14  5:49 AM      Result Value Ref Range   WBC 5.5  4.0 - 10.5 K/uL   RBC 3.47 (*) 3.87 - 5.11 MIL/uL   Hemoglobin 9.3 (*) 12.0 - 15.0 g/dL   HCT 30.3 (*) 36.0 - 46.0 %   MCV 87.3  78.0 - 100.0 fL   MCH 26.8  26.0 - 34.0 pg   MCHC 30.7  30.0 - 36.0 g/dL   RDW 16.3 (*) 11.5 - 15.5 %   Platelets 201  150 - 400 K/uL    Intake/Output Summary (Last 24 hours) at 07/11/14 0955 Last data filed at 07/11/14 0841  Gross per 24 hour  Intake    720 ml  Output   1150 ml  Net   -430 ml    ASSESSMENT AND PLAN:  Acute diastolic heart failure:  She will need O2 at discharge. Palliative care consult noted. Switch to PO diuretic today.   Anemia, iron deficiency:  Upper GI with gastritis.  No active bleeding.  GI evaluated.  No plans for colonoscopy.   Atrial fib:    Plan to try to get her back into sinus in the future when she has been on Eliquis for 3 weeks.  Restarted Eliquis  She is aware of the risk of bleeding.  Continue amio and beta blocker as rate control.  No plans for colonoscopy.  Hbg is lower today.      Minus Breeding 07/11/2014 9:55 AM

## 2014-07-11 NOTE — Consult Note (Signed)
Palliative Medicine Team at [facility]  Date: 07/11/2014   Patient Name: [name]  DOB: Apr 04, 1925  MRN: [id number]  Age / Sex: 80 y.o., female   PCP: Burnis Medin, MD Referring Physician: Domenic Polite, MD  Active Problems: Active Problems:   Hypothyroidism   Atrial fibrillation   Acute on chronic diastolic heart failure   Melena   Anemia, iron deficiency   CKD (chronic kidney disease) stage 3, GFR 30-59 ml/min   Other specified gastritis without mention of hemorrhage   Acute respiratory failure with hypoxia   HPI/Reason for Consultation: [name] is a 80 y.o. female with diastolic CHF, chronic A. Fib now on 3rd admission for acute on chronic CHF decompensation. PMT consulted for goals of care- family enquiring about options.  Participants in Discussion: HCPOA: yes Met with patients 2 daughter who are local [name] and [name] and her daughter [name] on the phone.  Advance Directive: Patient has a Living Will- it is at home in her important documents folder-I requested she bring this in for review and update if needed.   Code Status Orders        Start     Ordered   07/04/14 1304  Full code   Continuous     07/04/14 1303      I have reviewed the medical record, interviewed the patient and family, and examined the patient. The following aspects are pertinent.  Past Medical History  Diagnosis Date  . Dyslipidemia   . Hypertension   . Palpitations     PVCs/bigeminy on event in May 2009 revealing this was relatively asymptomatic  . Chest pain     a. 2011 Neg MV;  b. 01/2014 Cath: LM 20-30, LAD nl, D1 nl, LCX nl, OM1 nl, RCA dom 30-39m PD/PL nl, EF 65%->Med Rx.  . Degenerative joint disease   . Hx of varicella   . History of skin cancer     tafeen/ non melanoma.   . Thrombocytopenia   . Anemia   . Monoclonal gammopathies   . Right lower quadrant abdominal pain 07/19/2012    Recurrent  With nausea    This is new since she had ct for llq pain  in [facility]    R/o appendiceal problem  Hernia  Get ct scan  And plan  Fu     . Diastolic CHF, acute on chronic 01/31/2012  . Atrial fibrillation     a. Dx 01/2014->Eliquis started.  . Cancer   . Pacemaker   . Dysrhythmia    History   Social History  . Marital Status: Widowed    Spouse Name: N/A    Number of Children: 3  . Years of Education: N/A   Occupational History  .      [employer] book keeping   Social History Main Topics  . Smoking status: Never Smoker   . Smokeless tobacco: Never Used  . Alcohol Use: No  . Drug Use: No  . Sexual Activity: Not Currently   Other Topics Concern  . 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 Eats with her.   Family History  Problem Relation Age of Onset  .  Coronary artery disease Father   . Heart disease Father   . Lung cancer    . Alzheimer's disease Mother   . Cancer Brother 85    lung cancer  . Heart attack Father    Scheduled Meds: . amiodarone  200 mg Oral BID  . apixaban  2.5 mg Oral BID  . ferrous sulfate  325 mg Oral Q breakfast  . furosemide  40 mg Oral BID  . levothyroxine  75 mcg Oral QAC breakfast  . metoprolol tartrate  25 mg Oral Q12H  . pantoprazole  40 mg Oral Q0600  . polyethylene glycol  17 g Oral BID  . senna  2 tablet Oral Daily  . simvastatin  10 mg Oral q1800  . sodium chloride  3 mL Intravenous Q12H   Continuous Infusions:  PRN Meds:.acetaminophen, loratadine, morphine injection, ondansetron (ZOFRAN) IV, ondansetron, sodium chloride No Known Allergies CBC:    Component Value Date/Time   WBC 5.5 07/11/2014 0549   WBC 5.2 07/30/2013 1528   HGB 9.3* 07/11/2014 0549   HGB 8.9* 07/03/2014 1524   HGB 12.5 07/30/2013 1528   HCT 30.3* 07/11/2014 0549   HCT 37.5 07/30/2013 1528    PLT 201 07/11/2014 0549   PLT 153 07/30/2013 1528   MCV 87.3 07/11/2014 0549   MCV 90.7 07/30/2013 1528   NEUTROABS 4.3 07/04/2014 1349   NEUTROABS 3.1 07/30/2013 1528   LYMPHSABS 0.7 07/04/2014 1349   LYMPHSABS 1.4 07/30/2013 1528   MONOABS 0.6 07/04/2014 1349   MONOABS 0.4 07/30/2013 1528   EOSABS 0.0 07/04/2014 1349   EOSABS 0.1 07/30/2013 1528   BASOSABS 0.0 07/04/2014 1349   BASOSABS 0.1 07/30/2013 1528   Comprehensive Metabolic Panel:    Component Value Date/Time   NA 137 07/10/2014 0806   NA 142 07/30/2013 1528   K 4.2 07/10/2014 0806   K 4.3 07/30/2013 1528   CL 96 07/10/2014 0806   CL 107 01/11/2013 1248   CO2 29 07/10/2014 0806   CO2 28 07/30/2013 1528   BUN 24* 07/10/2014 0806   BUN 21.4 07/30/2013 1528   CREATININE 1.40* 07/10/2014 0806   CREATININE 1.0 07/30/2013 1528   GLUCOSE 97 07/10/2014 0806   GLUCOSE 131 07/30/2013 1528   GLUCOSE 94 01/11/2013 1248   CALCIUM 9.4 07/10/2014 0806   CALCIUM 9.9 07/30/2013 1528   AST 14 07/04/2014 1349   AST 15 07/30/2013 1528   ALT 10 07/04/2014 1349   ALT 12 07/30/2013 1528   ALKPHOS 99 07/04/2014 1349   ALKPHOS 89 07/30/2013 1528   BILITOT 0.6 07/04/2014 1349   BILITOT 0.58 07/30/2013 1528   PROT 7.0 07/04/2014 1349   PROT 6.6 07/30/2013 1528   ALBUMIN 3.2* 07/04/2014 1349   ALBUMIN 3.3* 07/30/2013 1528    Vital Signs: BP 95/49  Pulse 70  Temp(Src) 98.3 F (36.8 C) (Oral)  Resp 20  Ht 4' 10" (1.473 m)  Wt 70.171 kg (154 lb 11.2 oz)  BMI 32.34 kg/m2  SpO2 100% Filed Weights   07/09/14 0439 07/10/14 0442 07/11/14 0453  Weight: 70.308 kg (155 lb) 70.625 kg (155 lb 11.2 oz) 70.171 kg (154 lb 11.2 oz)   08/27 0701 - 08/28 0700 In: 480 [P.O.:480] Out: 1350 [Urine:1350]  Physical Exam:  AOx4, talkative, active and cooperative-active participant in her own decisions and care, she is ambulatory. Scattered crackles and trace LE edema. No distress. Nasal cannula O2.   Summary of Established Goals of Care and Medical Treatment Preferences  At  this  time Ms. Chaudhary desires aggressive treatment of her CHF, and related heart conditions. She does not desire invasive procedures such as surgery or colonoscopy. Her main goal is to avoid hospitalization and be better able to manage her symptoms at home. She has little if no knowledge about how to manage her heart failure- ie. Diet, activity, medications, weights etc... Which is really why she has returned to teh hospital-her family are very axnious- when she goes home she starts to gain weight and get dyspneic and ends up back in the hospital.  I do not think she is quite ready for Hospice- but I have provided education on this should she decline.She needs a coordinated home intervention for her CHF and caregivers.  Primary Diagnoses  Acute on Chronic Diastolic CHF EF 60%  Active Symptoms: 1. Dyspnea on Exertion main complaint and breathlessness 2. Throat congestion 3. GERD  Psycho-social/Spiritual:  Live independently and this is very important to her. Her husband died 10 years ago. She has a 90 lb lab at home who is her companion. Her daughter live 2 miles away and check on her several times a day. They are worried about her living alone-especially at night, they are concerned about managing her medications. 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 originally from [place] but moved to [place] then to [place].  Prognosis: Difficult to determine- she has improved significantly- she may have capacity to live for quite a while with optimal CHF management-but she is also 89 and still fragile. A fall would be devastating and I have discussed this with her in terms of her safety. She is eating fairly well.   Palliative Performance Scale: 50%  Recommendations:  1. Code Status: Remains FULL Code- she wants to think about it - her daughter were very emotional over this conversation-I provided education- her stated goals semm in line with DNR- will  hopefully formalize this tomorrow and review her living will. 2. Scope of Treatment:  1. Full scope with a focus on QOL and reasonable interventions given her age and debility 3. Symptom Management:  1. Needs Heart failure management mostly 2. PPI for her GERD 3. MObility and PT for independence 4. Palliative Prophylaxis: Bowel regimen, PRN pain and dyspnea medication 5. Disposition:  Home with the following services and resources:  Referral to [facility]  [facility] Heart Failure Navigator  Referral to [facility] program  Hospital bed, Home O2,[facility], HHPT  Time Total: 70 minutes Greater than 50%  of this time was spent counseling and coordinating care related to the above assessment and plan.  Signed by: GOLDING,ELIZABETH, DO  Elizabeth L Golding, DO  07/11/2014, 7:00 PM  Please contact Palliative Medicine Team phone at [phone] for questions and concerns.     

## 2014-07-12 ENCOUNTER — Inpatient Hospital Stay (HOSPITAL_COMMUNITY): Payer: Medicare Other

## 2014-07-12 LAB — CBC
HEMATOCRIT: 30.7 % — AB (ref 36.0–46.0)
Hemoglobin: 9.2 g/dL — ABNORMAL LOW (ref 12.0–15.0)
MCH: 25.7 pg — ABNORMAL LOW (ref 26.0–34.0)
MCHC: 30 g/dL (ref 30.0–36.0)
MCV: 85.8 fL (ref 78.0–100.0)
PLATELETS: 202 10*3/uL (ref 150–400)
RBC: 3.58 MIL/uL — ABNORMAL LOW (ref 3.87–5.11)
RDW: 16.4 % — ABNORMAL HIGH (ref 11.5–15.5)
WBC: 6 10*3/uL (ref 4.0–10.5)

## 2014-07-12 MED ORDER — FUROSEMIDE 10 MG/ML IJ SOLN
40.0000 mg | Freq: Once | INTRAMUSCULAR | Status: AC
  Administered 2014-07-12: 40 mg via INTRAVENOUS
  Filled 2014-07-12: qty 4

## 2014-07-12 MED ORDER — TRAMADOL HCL 50 MG PO TABS: 50.0000 mg | ORAL_TABLET | Freq: Two times a day (BID) | ORAL | Status: DC | PRN

## 2014-07-12 MED ORDER — FUROSEMIDE 40 MG PO TABS: 40.0000 mg | ORAL_TABLET | Freq: Two times a day (BID) | ORAL | Status: DC

## 2014-07-12 MED ORDER — TRAMADOL HCL 50 MG PO TABS
50.0000 mg | ORAL_TABLET | Freq: Two times a day (BID) | ORAL | Status: DC | PRN
  Administered 2014-07-13: 50 mg via ORAL
  Filled 2014-07-12: qty 1

## 2014-07-12 MED ORDER — METOPROLOL TARTRATE 25 MG PO TABS: 25.0000 mg | ORAL_TABLET | Freq: Two times a day (BID) | ORAL | Status: DC

## 2014-07-12 MED ORDER — SENNA 8.6 MG PO TABS: 2.0000 | ORAL_TABLET | Freq: Every day | ORAL | Status: DC | PRN

## 2014-07-12 NOTE — Progress Notes (Addendum)
PROGRESS NOTE    [name]:[id number] DOB: [birth year] DOA: 07/04/2014 PCP: [name], MD  HPI/Brief narrative 80 year old female with nonobstructive CAD, diastolic CHF, permanent atrial fibrillation, CKD stage III, hyperlipidemia, hypothyroidism, and iron deficiency anemia was admitted on 07/04/2014 because of shortness of breath, orthopnea, and leg edema. She was admitted to the cardiology service for acute on chronic diastolic CHF. The patient was started on intravenous furosemide 40 mg IV every 12 hours. She has had a good clinical response. Dr. Rayann Heman has requested the hospitalist service assume care of patient due to patient's multiple co-morbidities.  The patient reported melanotic stools prior to admission, found to have Iron defi anemia, GI consulted Patient status post EGD that showed gastritis. Colonoscopy cancelled. Cardiology had signed off 8/24 but re consulted 8/25 due to ongoing SOB 2/2 decompensated CHF  Palliative following, colonoscopy cancelled  Assessment/Plan:  Iron deficiency anemia/melanotic stool  -The patient's hemoglobin has gradually dropped over the past 2 months  -Iron saturation 3% with low ferritin--06/27/2014  -continue iron supplementation  -EGD 8/23 showed gastritis, Continue PPI.  -Initially plans for colonoscopy per GI for Thursday, but this has been cancelled after d/w family -FOBT x2 negative. -eloquis resumed, Hb stable in 9 range  Acute on chronic diastolic CHF  -ECHo EF 34-19%,  - Patient was initially treated with IV Lasix and then Lasix was discontinued over the weekend. On 8/24, patient dyspneic and hypoxic, started back on IV lasix per Cards, still with crackles, weight unchanged despite diuresis -changed to PO lasix per dr.Hochrein yesterday  Acute hypoxic respiratory failure - Secondary to decompensated CHF. Management as above.  atrial fibrillation  -Continue metoprolol tartrate and amiodarone  -Rate controlled   -Apixiban resumed  hypothyroidism  -Continue Synthroid  -TSH 2.760   Hyperlipidemia  -Continue statin   AoCKD stage III  -Since early July, baseline creatinine has been 1.2-1.5  -Continue to monitor with diuresis. Creatinine stable in the 1.5 range   Low back pain/upper back pain  -UA neg -lumbar xray- no acute findings. -PRN tramadol  Hypertension - Soft blood pressures: Monitor   Failure to thrive - based on discusson with daughter Sherron Monday in [facility], patient has been progressively declining with frequent hospitalizations over the last few months.  -Palliative meeting completed 8/28 -greatly appreciate Dr.Goldings input, Pt and family wanting Full scope of Rx -I re discussed Code status with her this am, she is till leaning towards being a full Code: Told me twice, "I would be ok with being resuscitated atleast once", i asked her to reconsider this again, and how it would not be appropriate for her given advanced age, fragility and her chronic medical conditions   Code Status: Full Family Communication: d/w daughter Ms.Spong via telephone 2days ago, no family at bedside Disposition Plan: Home with [facility] and O2. I left msg with CHF service for appt next week and she has a FU with Dr.Koneswaran [facility] on 9/4   Consultants:  Cardiology  GI  Palliative  Procedures:  EGD 8/23  Antibiotics:  None   Subjective: No new complaints, some intermittent dyspnea, slightly dizzy earlier this am better now  Objective: Filed Vitals:   07/11/14 1405 07/11/14 1745 07/11/14 2141 07/12/14 0500  BP: 99/44 95/49 103/64 108/54  Pulse: 70 70 70 76  Temp: 98.3 F (36.8 C)  97.5 F (36.4 C) 97.3 F (36.3 C)  TempSrc: Oral  Oral Oral  Resp: 20  19 19   Height:  Weight:    70.217 kg (154 lb 12.8 oz)  SpO2: 100%  97% 96%     Intake/Output Summary (Last 24 hours) at 07/12/14 1120 Last data filed at 07/12/14 0904  Gross per 24 hour  Intake    900 ml    Output    775 ml  Net    125 ml   Filed Weights   07/10/14 0442 07/11/14 0453 07/12/14 0500  Weight: 70.625 kg (155 lb 11.2 oz) 70.171 kg (154 lb 11.2 oz) 70.217 kg (154 lb 12.8 oz)     Exam:  General exam: Pleasant elderly frail female sitting up in chair without distress. Respiratory system: fine crackles at both bases Cardiovascular system: S1 & S2 heard, RRR. No JVD, murmurs, gallops, clicks or pedal edema.  Gastrointestinal system: Abdomen is nondistended, soft and nontender. Normal bowel sounds heard. Central nervous system: Alert and oriented. No focal neurological deficits. Extremities: Symmetric 5 x 5 power. Musculoskeletal: No reproducible tenderness across scapula and no acute findings.   Data Reviewed: Basic Metabolic Panel:  Recent Labs Lab 07/06/14 0430 07/07/14 0440 07/08/14 0430 07/09/14 0444 07/10/14 0806  NA 138 139 135* 136* 137  K 3.7 4.0 4.3 4.5 4.2  CL 98 100 98 99 96  CO2 29 28 27 27 29   GLUCOSE 103* 107* 108* 96 97  BUN 25* 25* 23 25* 24*  CREATININE 1.73* 1.53* 1.52* 1.44* 1.40*  CALCIUM 9.0 9.5 9.5 9.6 9.4   Liver Function Tests: No results found for this basename: AST, ALT, ALKPHOS, BILITOT, PROT, ALBUMIN,  in the last 168 hours No results found for this basename: LIPASE, AMYLASE,  in the last 168 hours No results found for this basename: AMMONIA,  in the last 168 hours CBC:  Recent Labs Lab 07/08/14 0430 07/09/14 0444 07/10/14 0806 07/11/14 0549 07/12/14 0333  WBC 4.8 5.9 5.3 5.5 6.0  HGB 8.7* 9.7* 10.1* 9.3* 9.2*  HCT 28.1* 31.3* 33.3* 30.3* 30.7*  MCV 85.9 85.5 87.4 87.3 85.8  PLT 183 194 222 201 202   Cardiac Enzymes: No results found for this basename: CKTOTAL, CKMB, CKMBINDEX, TROPONINI,  in the last 168 hours BNP (last 3 results)  Recent Labs  06/16/14 1237 06/27/14 1721 07/04/14 1349  PROBNP 543.0* 1950.0* 2180.0*   CBG: No results found for this basename: GLUCAP,  in the last 168 hours  No results found for  this or any previous visit (from the past 240 hour(s)).        Studies: No results found.      Scheduled Meds: . amiodarone  200 mg Oral BID  . apixaban  2.5 mg Oral BID  . ferrous sulfate  325 mg Oral Q breakfast  . furosemide  40 mg Oral BID  . levothyroxine  75 mcg Oral QAC breakfast  . metoprolol tartrate  25 mg Oral Q12H  . pantoprazole  40 mg Oral Q0600  . polyethylene glycol  17 g Oral BID  . senna  2 tablet Oral Daily  . simvastatin  10 mg Oral q1800  . sodium chloride  3 mL Intravenous Q12H   Continuous Infusions:    Active Problems:   Hypothyroidism   Atrial fibrillation   Acute on chronic diastolic heart failure   Melena   Anemia, iron deficiency   CKD (chronic kidney disease) stage 3, GFR 30-59 ml/min   Other specified gastritis without mention of hemorrhage   Acute respiratory failure with hypoxia    Time spent: 25 minutes.  Domenic Polite, MD,  [facility] Pager [phone]  If 7PM-7AM, please contact night-coverage [email or web address] Password [email or web address] 07/12/2014, 11:20 AM    LOS: 8 days

## 2014-07-12 NOTE — Progress Notes (Signed)
Patient ID: [name], female   DOB: November 04, 1925, 80 y.o.   MRN: [id number]      Subjective:    SOB improving but not resolved  Objective:   Temp:  [97.3 F (36.3 C)-98.3 F (36.8 C)] 97.3 F (36.3 C) (08/29 0500) Pulse Rate:  [70-76] 76 (08/29 0500) Resp:  [19-20] 19 (08/29 0500) BP: (95-108)/(44-64) 108/54 mmHg (08/29 0500) SpO2:  [96 %-100 %] 96 % (08/29 0500) Weight:  [154 lb 12.8 oz (70.217 kg)] 154 lb 12.8 oz (70.217 kg) (08/29 0500) Last BM Date: 07/10/14  Filed Weights   07/10/14 0442 07/11/14 0453 07/12/14 0500  Weight: 155 lb 11.2 oz (70.625 kg) 154 lb 11.2 oz (70.171 kg) 154 lb 12.8 oz (70.217 kg)    Intake/Output Summary (Last 24 hours) at 07/12/14 1245 Last data filed at 07/12/14 0904  Gross per 24 hour  Intake    900 ml  Output    775 ml  Net    125 ml    Exam:  General: NAD  Resp: faint crackles bilateral bases  Cardiac: RRR, no m/r/g, no JVD  GI: abdomen soft, NT, ND  MSK: trace bilateral edema  Neuro: no focal deficits  Psych: appropriate affect  Lab Results:  Basic Metabolic Panel:  Recent Labs Lab 07/08/14 0430 07/09/14 0444 07/10/14 0806  NA 135* 136* 137  K 4.3 4.5 4.2  CL 98 99 96  CO2 27 27 29   GLUCOSE 108* 96 97  BUN 23 25* 24*  CREATININE 1.52* 1.44* 1.40*  CALCIUM 9.5 9.6 9.4    Liver Function Tests: No results found for this basename: AST, ALT, ALKPHOS, BILITOT, PROT, ALBUMIN,  in the last 168 hours  CBC:  Recent Labs Lab 07/10/14 0806 07/11/14 0549 07/12/14 0333  WBC 5.3 5.5 6.0  HGB 10.1* 9.3* 9.2*  HCT 33.3* 30.3* 30.7*  MCV 87.4 87.3 85.8  PLT 222 201 202    Cardiac Enzymes: No results found for this basename: CKTOTAL, CKMB, CKMBINDEX, TROPONINI,  in the last 168 hours  BNP:  Recent Labs  06/16/14 1237 06/27/14 1721 07/04/14 1349  PROBNP 543.0* 1950.0* 2180.0*    Coagulation: No results found for this basename: INR,  in the last 168 hours  ECG:   Medications:   Scheduled  Medications: . amiodarone  200 mg Oral BID  . apixaban  2.5 mg Oral BID  . ferrous sulfate  325 mg Oral Q breakfast  . furosemide  40 mg Oral BID  . levothyroxine  75 mcg Oral QAC breakfast  . metoprolol tartrate  25 mg Oral Q12H  . pantoprazole  40 mg Oral Q0600  . polyethylene glycol  17 g Oral BID  . senna  2 tablet Oral Daily  . simvastatin  10 mg Oral q1800  . sodium chloride  3 mL Intravenous Q12H     Infusions:     PRN Medications:  acetaminophen, loratadine, ondansetron (ZOFRAN) IV, ondansetron, sodium chloride, traMADol     Assessment/Plan   1. Acute diastolic heart failure - echo 02/2014 LVEF 37-10%, diastolic function not described.  - negative 154mL yesterday, net negative 1.2 liters since admission. Downtrend in Cr, fairly labile baseline 1 to 1.5). Changed to oral lasix yesterday.  - would repeat CXR today given continued SOB. Would recommend more aggressive diuresis, will give additional IV lasix today. F/u CXR.   2. AFib - on amio, no anticoag given anemia        Carlyle Dolly, M.D., F.A.C.C.

## 2014-07-12 NOTE — Evaluation (Signed)
Physical Therapy Evaluation Patient Details Name: MIKITA LESMEISTER MRN: [id number] DOB: 07/07/1925 Today's Date: 07/12/2014   History of Present Illness    DorotheaMcMullen is a 80 y.o. female with diastolic CHF, chronic A. Fib now on 3rd admission for acute on chronic CHF decompensation. PMT consulted for goals of care- family enquiring about options.   Clinical Impression  Pt admitted with above. Pt currently with functional limitations due to the deficits listed below (see PT Problem List). Pt is a fall risk unless she uses rollator.  Pt probably close to or at her baseline right now. No LOB with min challenges with rollator.  Pt instructed that she must use rollator at all times.  Prior to this admit, she admits she does not always use the rollator. This PT feels that home is appropriate if pt uses rollator at all times, receives [place], [place], [place] and [place].  A hospital bed is also recommended.    Also may need O2.  Nursing to recheck later today and see if a different machine will pick up.  Pt's fingers were cyanotic and the O2 sat would not register with the ambulation during PT session.  Pt will benefit from skilled PT to increase their independence and safety with mobility to allow discharge to the venue listed below.      Follow Up Recommendations Home health PT;Supervision - Intermittent    Equipment Recommendations  Hospital bed (home O2 possibly)    Recommendations for Other Services       Precautions / Restrictions Precautions Precautions: Fall Restrictions Weight Bearing Restrictions: No      Mobility  Bed Mobility               General bed mobility comments: sitting EOB on arrival.  Transfers Overall transfer level: Needs assistance Equipment used: Rolling walker (2 wheeled) Transfers: Sit to/from Stand Sit to Stand: Supervision         General transfer comment: Needed cues for hand placement.  Ambulation/Gait Ambulation/Gait  assistance: Supervision Ambulation Distance (Feet): 235 Feet Assistive device: Rolling walker (2 wheeled) Gait Pattern/deviations: Step-through pattern;Decreased stride length;Wide base of support;Trunk flexed   Gait velocity interpretation: <1.8 ft/sec, indicative of risk for recurrent falls General Gait Details: Pt can ambulate with RW safely.  At times needed cues to stay close to RW but no LOB with RW.  In speaking with pt pt did not always use RW at home.  Discussed with pt that she needs to use RW at all times on d/c for safety.  Pt states she will do so.  Checked pulse ox with pt ambulating and it would not pick up on RA.  Pts fingers cyanotic and cold.  Replaced 2LO2 upon return to room and when O2 sats finally registered it read 86%.  Nursing aware.  Decided with nursing that she would ambulate pt in an hour or so and monitor with the portable wheeled monitor and make sure it wasnt just the portable monitor that PT had that was not registering.    Stairs            Wheelchair Mobility    Modified Rankin (Stroke Patients Only)       Balance Overall balance assessment: Needs assistance;History of Falls Sitting-balance support: No upper extremity supported;Feet supported Sitting balance-Leahy Scale: Good     Standing balance support: Bilateral upper extremity supported;During functional activity Standing balance-Leahy Scale: Poor Standing balance comment: Pt needed bil UE support.  High level balance activites: Direction changes;Turns;Sudden stops High Level Balance Comments: can perform these activities in controlled environment with RW with supervision.  No LOB noted.               Pertinent Vitals/Pain Pain Assessment: No/denies pain VSS    Home Living Family/patient expects to be discharged to:: Private residence Living Arrangements: Alone Available Help at Discharge: Family;Available PRN/intermittently Type of Home: House Home Access:  Stairs to enter Entrance Stairs-Rails: Can reach both;Left;Right Entrance Stairs-Number of Steps: 4 Home Layout: One level Home Equipment: Walker - 4 wheels;Cane - single point;Grab bars - toilet;Grab bars - tub/shower Additional Comments: Used cane or no device indoors and cane or Rollator outdoors.     Prior Function Level of Independence: Independent with assistive device(s)         Comments: Still driving, Doing minimal grocery shopping with daughter     Hand Dominance   Dominant Hand: Right    Extremity/Trunk Assessment   Upper Extremity Assessment: Defer to OT evaluation           Lower Extremity Assessment: Generalized weakness      Cervical / Trunk Assessment: Kyphotic  Communication   Communication: No difficulties  Cognition Arousal/Alertness: Awake/alert Behavior During Therapy: WFL for tasks assessed/performed Overall Cognitive Status: Within Functional Limits for tasks assessed                      General Comments General comments (skin integrity, edema, etc.): Pt c/o some "raw" area at sides on vagina and buttock.  Place barrier cream on buttock however called nursing to look at vaginal area as PT felt it llooked like yeast.  Nursing agreed that pt with yeast at vaginal area and placed Microguard powder on this area.      Exercises General Exercises - Lower Extremity Ankle Circles/Pumps: AROM;Both;5 reps;Seated Long Arc Quad: AROM;Both;5 reps;Seated      Assessment/Plan    PT Assessment Patient needs continued PT services  PT Diagnosis Generalized weakness   PT Problem List Decreased activity tolerance;Decreased balance;Decreased mobility;Decreased knowledge of use of DME;Decreased safety awareness;Decreased knowledge of precautions  PT Treatment Interventions DME instruction;Gait training;Functional mobility training;Therapeutic activities;Therapeutic exercise;Balance training;Patient/family education   PT Goals (Current goals can be  found in the Care Plan section) Acute Rehab PT Goals Patient Stated Goal: to go home PT Goal Formulation: With patient Time For Goal Achievement: 07/19/14 Potential to Achieve Goals: Good    Frequency Min 3X/week   Barriers to discharge Decreased caregiver support      Co-evaluation               End of Session Equipment Utilized During Treatment: Gait belt;Oxygen Activity Tolerance: Patient limited by fatigue Patient left: in chair;with call bell/phone within reach;with nursing/sitter in room Nurse Communication: Mobility status         Time: 0920-0946 PT Time Calculation (min): 26 min   Charges:   PT Evaluation $Initial PT Evaluation Tier I: 1 Procedure PT Treatments $Gait Training: 8-22 mins   PT G Codes:          INGOLD,Armonie Mettler July 14, 2014, 10:50 AM [facility] [phone] (pager)

## 2014-07-13 DIAGNOSIS — R079 Chest pain, unspecified: Secondary | ICD-10-CM

## 2014-07-13 LAB — BASIC METABOLIC PANEL
Anion gap: 10 (ref 5–15)
BUN: 20 mg/dL (ref 6–23)
CO2: 31 meq/L (ref 19–32)
CREATININE: 1.42 mg/dL — AB (ref 0.50–1.10)
Calcium: 9.5 mg/dL (ref 8.4–10.5)
Chloride: 96 mEq/L (ref 96–112)
GFR calc Af Amer: 37 mL/min — ABNORMAL LOW (ref >90)
GFR calc non Af Amer: 32 mL/min — ABNORMAL LOW (ref >90)
Glucose, Bld: 163 mg/dL — ABNORMAL HIGH (ref 70–99)
Potassium: 3.9 mEq/L (ref 3.7–5.3)
SODIUM: 137 meq/L (ref 137–147)

## 2014-07-13 LAB — CBC
HEMATOCRIT: 31.2 % — AB (ref 36.0–46.0)
Hemoglobin: 9.5 g/dL — ABNORMAL LOW (ref 12.0–15.0)
MCH: 26.7 pg (ref 26.0–34.0)
MCHC: 30.4 g/dL (ref 30.0–36.0)
MCV: 87.6 fL (ref 78.0–100.0)
Platelets: 197 10*3/uL (ref 150–400)
RBC: 3.56 MIL/uL — AB (ref 3.87–5.11)
RDW: 16.5 % — ABNORMAL HIGH (ref 11.5–15.5)
WBC: 5.7 10*3/uL (ref 4.0–10.5)

## 2014-07-13 MED ORDER — SALINE SPRAY 0.65 % NA SOLN
2.0000 | Freq: Three times a day (TID) | NASAL | Status: DC
  Administered 2014-07-13 – 2014-07-14 (×4): 2 via NASAL
  Filled 2014-07-13: qty 44

## 2014-07-13 MED ORDER — ONDANSETRON HCL 8 MG PO TABS
8.0000 mg | ORAL_TABLET | Freq: Once | ORAL | Status: AC
  Administered 2014-07-13: 8 mg via ORAL
  Filled 2014-07-13: qty 2
  Filled 2014-07-13: qty 1

## 2014-07-13 MED ORDER — FUROSEMIDE 10 MG/ML IJ SOLN
40.0000 mg | Freq: Four times a day (QID) | INTRAMUSCULAR | Status: AC
  Administered 2014-07-13 (×2): 40 mg via INTRAVENOUS
  Filled 2014-07-13 (×2): qty 4

## 2014-07-13 MED ORDER — PANTOPRAZOLE SODIUM 40 MG PO TBEC
40.0000 mg | DELAYED_RELEASE_TABLET | Freq: Two times a day (BID) | ORAL | Status: DC
  Administered 2014-07-13 – 2014-07-14 (×2): 40 mg via ORAL
  Filled 2014-07-13 (×2): qty 1

## 2014-07-13 MED ORDER — HYDROCODONE-ACETAMINOPHEN 5-325 MG PO TABS: 1.0000 | ORAL_TABLET | ORAL | Status: DC | PRN

## 2014-07-13 NOTE — Progress Notes (Signed)
PROGRESS NOTE    [name]:[id number] DOB: 01/23/1925 DOA: 07/04/2014 PCP: [name], MD  HPI/Brief narrative 80 year old female with nonobstructive CAD, diastolic CHF, permanent atrial fibrillation, CKD stage III, hyperlipidemia, hypothyroidism, and iron deficiency anemia was admitted on 07/04/2014 because of shortness of breath, orthopnea, and leg edema. She was admitted to the cardiology service for acute on chronic diastolic CHF. The patient was started on intravenous furosemide 40 mg IV every 12 hours. She has had a good clinical response. Dr. Rayann Heman has requested the hospitalist service assume care of patient due to patient's multiple co-morbidities.  The patient reported melanotic stools prior to admission, found to have Iron defi anemia, GI consulted Patient status post EGD that showed gastritis. Colonoscopy cancelled. Cardiology had signed off 8/24 but re consulted 8/25 due to ongoing SOB 2/2 decompensated CHF  Palliative following, colonoscopy cancelled  Assessment/Plan:  Iron deficiency anemia/melanotic stool  -Iron saturation 3% with low ferritin--06/27/2014  -continue iron supplementation  -EGD 8/23 showed gastritis, Continue PPI.  -Initially plans for colonoscopy per GI for Thursday, but this has been cancelled after d/w family -FOBT x2 negative. -eloquis resumed, Hb stable in 9 range  Acute on chronic diastolic CHF  -ECHo EF 56-38%,  - Patient was initially treated with IV Lasix and then Lasix was discontinued over the weekend. On 8/24, patient dyspneic and hypoxic, started back on IV lasix per Cards, still with crackles, weight unchanged despite diuresis -changed to PO lasix per dr.Hochrein 8/28, given Iv lasix x1 yesterday  Acute hypoxic respiratory failure - Secondary to decompensated CHF. Management as above.  atrial fibrillation  -Continue metoprolol tartrate and amiodarone  -Rate controlled  -Apixiban resumed  hypothyroidism    -Continue Synthroid  -TSH 2.760   Hyperlipidemia  -Continue statin   AoCKD stage III  -Since early July, baseline creatinine has been 1.2-1.5  -Continue to monitor with diuresis. Creatinine stable in the 1.5 range   Low back pain/upper back pain  -UA neg -lumbar xray- no acute findings. -PRN tramadol  Hypertension - Soft blood pressures: Monitor   Failure to thrive - based on discusson with daughter Sherron Monday in [facility], patient has been progressively declining with frequent hospitalizations over the last few months.  -Palliative meeting completed 8/28 -greatly appreciate Dr.Goldings input, Pt and family wanting Full scope of Rx -I re discussed Code status with her this am, she is till leaning towards being a full Code: Told me twice, "I would be ok with being resuscitated atleast once", i asked her to reconsider this again, and how it would not be appropriate for her given advanced age, fragility and her chronic medical conditions   Code Status: Full Family Communication: d/w daughter Ms.Spong via telephone 2days ago and today, no family at bedside Disposition Plan: Home with Palliative care Services and O2. I left msg with CHF service for appt next week and she has a FU with Dr.Koneswaran [facility] on 9/4   Consultants:  Cardiology  GI  Palliative  Procedures:  EGD 8/23  Antibiotics:  None   Subjective: Complains of nausea and some back/chest discomfort after tramadol  Objective: Filed Vitals:   07/12/14 1419 07/12/14 2007 07/12/14 2012 07/13/14 0555  BP: 90/43  109/55 118/92  Pulse: 68 71 80 70  Temp: 98 F (36.7 C) 97.9 F (36.6 C) 97.6 F (36.4 C) 98 F (36.7 C)  TempSrc: Oral Oral Oral   Resp: 20 20 20 18   Height:      Weight:  69.854 kg (154 lb)  SpO2: 99% 99% 100% 100%     Intake/Output Summary (Last 24 hours) at 07/13/14 1254 Last data filed at 07/13/14 1000  Gross per 24 hour  Intake   1401 ml  Output   1250 ml  Net    151 ml    Filed Weights   07/11/14 0453 07/12/14 0500 07/13/14 0555  Weight: 70.171 kg (154 lb 11.2 oz) 70.217 kg (154 lb 12.8 oz) 69.854 kg (154 lb)     Exam:  General exam: Pleasant elderly frail female sitting up in chair without distress. Respiratory system: fine crackles at both bases Cardiovascular system: S1 & S2 heard, RRR. No JVD, murmurs, gallops, clicks or pedal edema.  Gastrointestinal system: Abdomen is nondistended, soft and nontender. Normal bowel sounds heard. Central nervous system: Alert and oriented. No focal neurological deficits. Extremities: Symmetric 5 x 5 power. Musculoskeletal: No reproducible tenderness across scapula and no acute findings.   Data Reviewed: Basic Metabolic Panel:  Recent Labs Lab 07/07/14 0440 07/08/14 0430 07/09/14 0444 07/10/14 0806  NA 139 135* 136* 137  K 4.0 4.3 4.5 4.2  CL 100 98 99 96  CO2 28 27 27 29   GLUCOSE 107* 108* 96 97  BUN 25* 23 25* 24*  CREATININE 1.53* 1.52* 1.44* 1.40*  CALCIUM 9.5 9.5 9.6 9.4   Liver Function Tests: No results found for this basename: AST, ALT, ALKPHOS, BILITOT, PROT, ALBUMIN,  in the last 168 hours No results found for this basename: LIPASE, AMYLASE,  in the last 168 hours No results found for this basename: AMMONIA,  in the last 168 hours CBC:  Recent Labs Lab 07/09/14 0444 07/10/14 0806 07/11/14 0549 07/12/14 0333 07/13/14 0402  WBC 5.9 5.3 5.5 6.0 5.7  HGB 9.7* 10.1* 9.3* 9.2* 9.5*  HCT 31.3* 33.3* 30.3* 30.7* 31.2*  MCV 85.5 87.4 87.3 85.8 87.6  PLT 194 222 201 202 197   Cardiac Enzymes: No results found for this basename: CKTOTAL, CKMB, CKMBINDEX, TROPONINI,  in the last 168 hours BNP (last 3 results)  Recent Labs  06/16/14 1237 06/27/14 1721 07/04/14 1349  PROBNP 543.0* 1950.0* 2180.0*   CBG: No results found for this basename: GLUCAP,  in the last 168 hours  No results found for this or any previous visit (from the past 240 hour(s)).        Studies: Dg Chest  Port 1 View  07/12/2014   CLINICAL DATA:  Shortness of breath  EXAM: PORTABLE CHEST - 1 VIEW  COMPARISON:  8/24/ 2015  FINDINGS: Cardiac shadow is enlarged. A pacing device is again seen. Lungs are well aerated. Mild left basilar atelectasis is again seen. No pneumothorax is noted. No bony abnormality is noted.  IMPRESSION: Persistent left basilar atelectasis.   Electronically Signed   By: Inez Catalina M.D.   On: 07/12/2014 16:31        Scheduled Meds: . amiodarone  200 mg Oral BID  . apixaban  2.5 mg Oral BID  . ferrous sulfate  325 mg Oral Q breakfast  . furosemide  40 mg Oral BID  . levothyroxine  75 mcg Oral QAC breakfast  . metoprolol tartrate  25 mg Oral Q12H  . pantoprazole  40 mg Oral BID  . polyethylene glycol  17 g Oral BID  . senna  2 tablet Oral Daily  . simvastatin  10 mg Oral q1800  . sodium chloride  2-4 spray Each Nare TID  . sodium chloride  3 mL Intravenous  Q12H   Continuous Infusions:    Active Problems:   Hypothyroidism   Atrial fibrillation   Acute on chronic diastolic heart failure   Melena   Anemia, iron deficiency   CKD (chronic kidney disease) stage 3, GFR 30-59 ml/min   Other specified gastritis without mention of hemorrhage   Acute respiratory failure with hypoxia    Time spent: 25 minutes.    Domenic Polite, MD,  [facility] Pager [phone]  If 7PM-7AM, please contact night-coverage [email or web address] Password [email or web address] 07/13/2014, 12:54 PM    LOS: 9 days

## 2014-07-13 NOTE — Progress Notes (Signed)
Patient ID: [name], female   DOB: [birth year], 80 y.o.   MRN: [id number]     Subjective:    Still with some SOB  Objective:   Temp:  [97.6 F (36.4 C)-98 F (36.7 C)] 98 F (36.7 C) (08/30 0555) Pulse Rate:  [68-80] 70 (08/30 0555) Resp:  [18-20] 18 (08/30 0555) BP: (90-118)/(43-92) 118/92 mmHg (08/30 0555) SpO2:  [99 %-100 %] 100 % (08/30 0555) Weight:  [154 lb (69.854 kg)] 154 lb (69.854 kg) (08/30 0555) Last BM Date: 07/12/14  Filed Weights   07/11/14 0453 07/12/14 0500 07/13/14 0555  Weight: 154 lb 11.2 oz (70.171 kg) 154 lb 12.8 oz (70.217 kg) 154 lb (69.854 kg)    Intake/Output Summary (Last 24 hours) at 07/13/14 1103 Last data filed at 07/13/14 1000  Gross per 24 hour  Intake   1401 ml  Output   1250 ml  Net    151 ml    Exam:  General: NAD:  Resp: faint crackles bilateral bases  Cardiac: RRR, no m/r/g, no JVD  GI: abdomen soft, NT, ND  MSK: 1+ LE edema  Neuro: no focal deficits   Lab Results:  Basic Metabolic Panel:  Recent Labs Lab 07/08/14 0430 07/09/14 0444 07/10/14 0806  NA 135* 136* 137  K 4.3 4.5 4.2  CL 98 99 96  CO2 27 27 29   GLUCOSE 108* 96 97  BUN 23 25* 24*  CREATININE 1.52* 1.44* 1.40*  CALCIUM 9.5 9.6 9.4    Liver Function Tests: No results found for this basename: AST, ALT, ALKPHOS, BILITOT, PROT, ALBUMIN,  in the last 168 hours  CBC:  Recent Labs Lab 07/11/14 0549 07/12/14 0333 07/13/14 0402  WBC 5.5 6.0 5.7  HGB 9.3* 9.2* 9.5*  HCT 30.3* 30.7* 31.2*  MCV 87.3 85.8 87.6  PLT 201 202 197    Cardiac Enzymes: No results found for this basename: CKTOTAL, CKMB, CKMBINDEX, TROPONINI,  in the last 168 hours  BNP:  Recent Labs  06/16/14 1237 06/27/14 1721 07/04/14 1349  PROBNP 543.0* 1950.0* 2180.0*    Coagulation: No results found for this basename: INR,  in the last 168 hours  ECG:   Medications:   Scheduled Medications: . amiodarone  200 mg Oral BID  . apixaban  2.5 mg Oral BID  .  ferrous sulfate  325 mg Oral Q breakfast  . furosemide  40 mg Oral BID  . levothyroxine  75 mcg Oral QAC breakfast  . metoprolol tartrate  25 mg Oral Q12H  . pantoprazole  40 mg Oral Q0600  . polyethylene glycol  17 g Oral BID  . senna  2 tablet Oral Daily  . simvastatin  10 mg Oral q1800  . sodium chloride  3 mL Intravenous Q12H     Infusions:     PRN Medications:  acetaminophen, loratadine, ondansetron (ZOFRAN) IV, ondansetron, sodium chloride, traMADol     Assessment/Plan   1. Acute diastolic heart failure  - echo 02/2014 LVEF 17-51%, diastolic function not described.  - negative 146mL yesterday, net negative 1 liters since admission. Has not had repeat BMET, will order. Her baseline Cr appears to be 1.2-1.6.  - CXR yesterday without significant abnormality.  - she continues to have SOB, she really has not diuresed much since admission. Has continued O2 requirement, she has not been on home oxygen previously. I would try to diurese her more, her exam still has some evidence of extra volume and she has continued symptoms. Will await repeat  labs, if renal function ok will dose IV lasix 40mg  IV bid today.    2. AFib  - on amio, no anticoag given anemia - soft bp's at times, decrease to lopressor 12.5mg  bid.         Carlyle Dolly, M.D., F.A.C.C.

## 2014-07-13 NOTE — Progress Notes (Signed)
Palliative Medicine Team Progress Note  [name] had back pain this AM "from being in the bed". She was given Tramadol at 0800 and developed moderate to severe nausea-likely from that medication. She complained of vague chest pain which I think was mostly associated with her nausea and back pain-known esophagitis and gastritis.   I called her daughter [name] to update her-she is very nervous and anxious about her mother's discharge home- she wants someone to come in an check on her mother everyday- he mother calls them regularly with many different complaints and needs and I think it is overwhelming for them. I explained that other than paid private duty caregivers there is no covered service that will provide this level of in home are-the only other option is a SNF and [name] adamantly refuses that option.  Recommendations:  1. D/C Tramadol- changed to hydrocodone/apap 2. CM please give private duty caregiver list 3. Provided reassurance to the daughter [name] 4. Pall referral completed 5. CHF clinic referral completed 6. Patient unable to fully understand nature of her code status-I requested the actual living will that she has at home- including HCPOA information. She remains a Full Code- she wants "just one try" 7. No equipment in the home yet.  Will follow.  Time: 11:00-1200PM 60 minutes Greater than 50%  of this time was spent counseling and coordinating care related to the above assessment and plan.  Lane Hacker, DO Palliative Medicine

## 2014-07-13 NOTE — Progress Notes (Signed)
Pt ambulated in hallway with walker and without oxygen. Sats checked by 2 pulse oximeters showed sat of 98-100%. Pt insisted on continuing to use O2 so 2L placed for comfort.

## 2014-07-13 NOTE — Discharge Instructions (Signed)

## 2014-07-14 LAB — BASIC METABOLIC PANEL
Anion gap: 12 (ref 5–15)
BUN: 21 mg/dL (ref 6–23)
CHLORIDE: 97 meq/L (ref 96–112)
CO2: 29 mEq/L (ref 19–32)
Calcium: 9.2 mg/dL (ref 8.4–10.5)
Creatinine, Ser: 1.46 mg/dL — ABNORMAL HIGH (ref 0.50–1.10)
GFR calc non Af Amer: 31 mL/min — ABNORMAL LOW (ref >90)
GFR, EST AFRICAN AMERICAN: 36 mL/min — AB (ref >90)
Glucose, Bld: 87 mg/dL (ref 70–99)
Potassium: 4.1 mEq/L (ref 3.7–5.3)
Sodium: 138 mEq/L (ref 137–147)

## 2014-07-14 LAB — CBC
HEMATOCRIT: 31.9 % — AB (ref 36.0–46.0)
Hemoglobin: 9.8 g/dL — ABNORMAL LOW (ref 12.0–15.0)
MCH: 26.2 pg (ref 26.0–34.0)
MCHC: 30.7 g/dL (ref 30.0–36.0)
MCV: 85.3 fL (ref 78.0–100.0)
Platelets: 217 10*3/uL (ref 150–400)
RBC: 3.74 MIL/uL — ABNORMAL LOW (ref 3.87–5.11)
RDW: 16.7 % — AB (ref 11.5–15.5)
WBC: 5.8 10*3/uL (ref 4.0–10.5)

## 2014-07-14 MED ORDER — FUROSEMIDE 40 MG PO TABS
40.0000 mg | ORAL_TABLET | Freq: Two times a day (BID) | ORAL | Status: DC
  Administered 2014-07-14: 40 mg via ORAL
  Filled 2014-07-14 (×3): qty 1

## 2014-07-14 MED ORDER — FERROUS SULFATE 325 (65 FE) MG PO TABS
325.0000 mg | ORAL_TABLET | Freq: Every day | ORAL | Status: DC
  Administered 2014-07-14: 325 mg via ORAL
  Filled 2014-07-14 (×2): qty 1

## 2014-07-14 NOTE — Progress Notes (Signed)
SATURATION QUALIFICATIONS: (This note is used to comply with regulatory documentation for home oxygen)  Patient Saturations on Room Air at Rest = 98%  Patient Saturations on Room Air while Ambulating = 83%  Patient Saturations on 2 Liters of oxygen while Ambulating = 98%  Please briefly explain why patient needs home oxygen: Pt's oxygen saturation decreases to 83% while ambulating without oxygen.

## 2014-07-14 NOTE — Progress Notes (Signed)
Subjective: Feeling better today.    Objective: Vital signs in last 24 hours: Temp:  [97.4 F (36.3 C)-97.9 F (36.6 C)] 97.5 F (36.4 C) (08/31 0549) Pulse Rate:  [62-72] 72 (08/31 0926) Resp:  [17-20] 17 (08/31 0549) BP: (92-108)/(40-64) 98/48 mmHg (08/31 0926) SpO2:  [97 %-98 %] 97 % (08/31 0549) Weight:  [152 lb 5.4 oz (69.1 kg)] 152 lb 5.4 oz (69.1 kg) (08/31 0549) Last BM Date: 07/13/14  Intake/Output from previous day: 08/30 0701 - 08/31 0700 In: 803 [P.O.:800; I.V.:3] Out: 1700 [Urine:1700] Intake/Output this shift: Total I/O In: 3 [I.V.:3] Out: 100 [Urine:100]  Medications Current Facility-Administered Medications  Medication Dose Route Frequency Provider Last Rate Last Dose  . acetaminophen (TYLENOL) tablet 650 mg  650 mg Oral Q4H PRN Burtis Junes, NP   650 mg at 07/14/14 0351  . amiodarone (PACERONE) tablet 200 mg  200 mg Oral BID Burtis Junes, NP   200 mg at 07/14/14 2725  . apixaban (ELIQUIS) tablet 2.5 mg  2.5 mg Oral BID Minus Breeding, MD   2.5 mg at 07/14/14 0931  . ferrous sulfate tablet 325 mg  325 mg Oral Q breakfast Domenic Polite, MD   325 mg at 07/14/14 [phone]  . HYDROcodone-acetaminophen (NORCO/VICODIN) 5-325 MG per tablet 1-2 tablet  1-2 tablet Oral Q4H PRN Acquanetta Chain, DO      . levothyroxine (SYNTHROID, LEVOTHROID) tablet 75 mcg  75 mcg Oral QAC breakfast Burtis Junes, NP   75 mcg at 07/14/14 [phone]  . loratadine (CLARITIN) tablet 10 mg  10 mg Oral Daily PRN Stephani Police, MD   10 mg at 07/14/14 [phone]  . metoprolol tartrate (LOPRESSOR) tablet 25 mg  25 mg Oral Q12H Thompson Grayer, MD   25 mg at 07/14/14 0926  . ondansetron (ZOFRAN) injection 4 mg  4 mg Intravenous Q8H PRN Candee Furbish, MD   4 mg at 07/10/14 0740   And  . ondansetron [facility]) tablet 4 mg  4 mg Oral Q8H PRN Candee Furbish, MD   4 mg at 07/06/14 1709  . pantoprazole (PROTONIX) EC tablet 40 mg  40 mg Oral BID Acquanetta Chain, DO   40 mg at 07/14/14 0931  . polyethylene  glycol (MIRALAX / GLYCOLAX) packet 17 g  17 g Oral BID Ladene Artist, MD   17 g at 07/12/14 2144  . senna (SENOKOT) tablet 17.2 mg  2 tablet Oral Daily Orson Eva, MD   17.2 mg at 07/14/14 0932  . simvastatin (ZOCOR) tablet 10 mg  10 mg Oral q1800 Burtis Junes, NP   10 mg at 07/13/14 1711  . sodium chloride (OCEAN) 0.65 % nasal spray 2-4 spray  2-4 spray Each Nare TID Acquanetta Chain, DO   2 spray at 07/14/14 0932  . sodium chloride 0.9 % injection 3 mL  3 mL Intravenous Q12H Burtis Junes, NP   3 mL at 07/14/14 0932  . sodium chloride 0.9 % injection 3 mL  3 mL Intravenous PRN Burtis Junes, NP   3 mL at 07/07/14 0957    PE: General appearance: alert, cooperative and no distress Lungs: clear to auscultation bilaterally Heart: regular rate and rhythm, S1, S2 normal, no murmur, click, rub or gallop Extremities: Trace LEE Pulses: 1+ radials and 2+ DPs Skin: Warm and dry Neurologic: Grossly normal  Lab Results:   Recent Labs  07/12/14 0333 07/13/14 0402 07/14/14 0705  WBC 6.0 5.7 5.8  HGB 9.2*  9.5* 9.8*  HCT 30.7* 31.2* 31.9*  PLT 202 197 217   BMET  Recent Labs  07/13/14 1250 07/14/14 0705  NA 137 138  K 3.9 4.1  CL 96 97  CO2 31 29  GLUCOSE 163* 87  BUN 20 21  CREATININE 1.42* 1.46*  CALCIUM 9.5 9.2    Assessment/Plan   Active Problems:   Hypothyroidism   Atrial fibrillation   Acute on chronic diastolic heart failure   Melena   Anemia, iron deficiency   CKD (chronic kidney disease) stage 3, GFR 30-59 ml/min   Other specified gastritis without mention of hemorrhage   Acute respiratory failure with hypoxia  1. Acute diastolic heart failure  - echo 02/2014 LVEF 84-66%, diastolic function not described.  - Net fluids:  -0.9L/-2.5L.  Her baseline Cr appears to be 1.2-1.6.  Today 1.46 and stable. She appears euvolemic.  Just atrace of LEE.  Lasix was stopped yesterday.  - CXR Sat. without significant abnormality.  - Breathing better.  Has continued O2  requirement, she has not been on home oxygen previously.  -Ambulate this morning.    2. AFib  - on amio and Eliquis  - Continues to have soft bp's.  On lopressor 25mg  bid  - Reg rhythm and rate on exam.  Not on tele.    LOS: 10 days    HAGER, BRYAN PA-C 07/14/2014 9:33 AM  As above, patient seen and examined. She appears to be euvolemic on examination. No chest pain. Would continue present dose of Lasix. She will need close followup of renal function after discharge. Continue amiodarone, metoprolol and apixaban at discharge. She will need close followup of hemoglobin. Followup with Dr. Harrington Challenger. Kirk Ruths

## 2014-07-14 NOTE — Progress Notes (Signed)
Discussed discharge instructions with pt including how and when to call the dr, follow up appts, medications to take at home, prescriptions, diet, and activity. Pt verbalized understanding and denied questions.   Eulis Canner, RN

## 2014-07-14 NOTE — Discharge Summary (Signed)
Physician Discharge Summary  [name] PPI:[id number] DOB: 01-22-1925 DOA: 07/04/2014  PCP: [name], MD  Admit date: 07/04/2014 Discharge date: 07/14/2014  Time spent: 45 minutes  Recommendations for Outpatient Follow-up:  1. Dr.Bensimhon CHF clinic on 9/4 2. [place], advised both daughters to also hire private home Linda  Discharge Diagnoses:     Acute on chronic diastolic heart failure    Dark stools, heme negative   Anemia, iron deficiency   CKD (chronic kidney disease) stage 3, GFR 30-59 ml/min   Other specified gastritis without mention of hemorrhage   Acute respiratory failure with hypoxia  Hypothyroidism   Atrial fibrillation  Symptomatic bradycardia s/p Pacer  Chronic back pain  Discharge Condition: stable  Diet recommendation: low sodium, heart healthy  Filed Weights   07/12/14 0500 07/13/14 0555 07/14/14 0549  Weight: 70.217 kg (154 lb 12.8 oz) 69.854 kg (154 lb) 69.1 kg (152 lb 5.4 oz)    History of present illness:  [name] is a 80 y.o. female with the history of nonobstructive CAD by cardiac catheterization in 01/2014, persistent atrial fibrillation, diastolic CHF, HTN, HL, CKD.  Patient was admitted 04/2014 x2 with acute on chronic diastolic CHF. She underwent cardioversion with restoration of NSR. This resulted in profound bradycardia and hypotension requiring pacemaker implantation. Pacemaker implant was complicated by acute blood loss anemia requiring transfusion with PRBCs. Her Eliquis was held for several days. She was discharged to SNF. Carvedilol was held secondary to hypotension.  In cards FU was placed on amiodarone - more for rate control.  Was seen in the Office with shortness of breath, 2 pillow orthopnea, legh swelling. Her stools were quite dark - had just started iron.Also reported vague chest heaviness as well  Hospital Course:  80 year old female with nonobstructive CAD, diastolic CHF, permanent  atrial fibrillation, CKD stage III, hyperlipidemia, hypothyroidism, and iron deficiency anemia was admitted on 07/04/2014 because of shortness of breath, orthopnea, and leg edema. She was admitted to the cardiology service for acute on chronic diastolic CHF. The patient was started on intravenous furosemide 40 mg IV every 12 hours. She has had a good clinical response. Dr. Rayann Heman has requested the hospitalist service assume Linda of patient due to patient's multiple co-morbidities.  The patient reported dark stools prior to admission, just started PO Iron, found to have Iron defi anemia, GI consulted. Patient status post EGD that showed gastritis. Colonoscopy was considered then cancelled per daughters. Cardiology had signed off 8/24 but re consulted 8/25 due to ongoing SOB 2/2 decompensated CHF  Palliative consulted as well.   Detailed course  Iron deficiency anemia/dark stools -FOBT negative x2, suspect dark stool due to Iron supplementation -Iron saturation 3% with low ferritin--06/27/2014  -continue iron supplementation  -EGD 8/23 showed gastritis, Continue PPI.  -Initially plans for colonoscopy per GI for Thursday, but this has been cancelled after d/w family  -eloquis resumed, Hb stable in 9 range   Acute on chronic diastolic CHF  -ECHo EF 06-30%,  - Patient was initially treated with IV Lasix and then Lasix was discontinued over the weekend. On 8/24, patient dyspneic and hypoxic, started back on IV lasix per Cards, still with crackles, weight was mostly unchanged despite diuresis, finally starting to improve -changed to PO lasix per dr.Hochrein 8/28, given Iv lasix x1 8/29 and 8/30 -now stable on PO lasix -Will be discharged home with Home health services/RN for Chronic disease management -made Fu with CHF service for 89/4  Acute hypoxic respiratory  failure  - Secondary to decompensated CHF. Management as above.   Atrial fibrillation  -Continue metoprolol tartrate and amiodarone   -Rate controlled  -Apixiban resumed   hypothyroidism  -Continue Synthroid  -TSH 2.760   Hyperlipidemia  -Continue statin   AoCKD stage III  -Since early July, baseline creatinine has been 1.2-1.5  -Continue to monitor with diuresis. Creatinine stable in the 1.5 range   Low back pain/upper back pain  -UA neg  -lumbar xray- no acute findings.  -PRN tylenol  Failure to thrive  - based on discusson with daughter [name] in [facility], patient has been progressively declining with frequent hospitalizations over the last few months.  -Palliative meeting completed 8/28 was seen by Dr.Golding but Pt and family wanting Full scope of Rx, including FULL CODE -I re discussed Code status with her 3 times, she still wants to be a full Code: Told us multiple times that she would like to be resuscitated atleast once.  i asked her to reconsider this again, and how it would not be appropriate for her given advanced age, fragility and her chronic medical conditions    Consultations:  Cardiology  Palliative Linda  GI  Discharge Exam: Filed Vitals:   07/14/14 0926  BP: 98/48  Pulse: 72  Temp: 97.4 F (36.3 C)  Resp: 16    General: AAOx3 Cardiovascular: S1S2/RRR Respiratory: faint basilar crackles  Discharge Instructions You were cared for by a hospitalist during your hospital stay. If you have any questions about your discharge medications or the Linda you received while you were in the hospital after you are discharged, you can call the unit and asked to speak with the hospitalist on call if the hospitalist that took Linda of you is not available. Once you are discharged, your primary Linda physician will handle any further medical issues. Please note that NO REFILLS for any discharge medications will be authorized once you are discharged, as it is imperative that you return to your primary Linda physician (or establish a relationship with a primary Linda physician if you do not have one)  for your aftercare needs so that they can reassess your need for medications and monitor your lab values.  Discharge Instructions   Diet - low sodium heart healthy    Complete by:  As directed      Increase activity slowly    Complete by:  As directed             Medication List    STOP taking these medications       aspirin 81 MG tablet      TAKE these medications       amiodarone 200 MG tablet  Commonly known as:  PACERONE  Take 1 tablet (200 mg total) by mouth 2 (two) times daily. BID for 2 weeks then 1 daily     apixaban 2.5 MG Tabs tablet  Commonly known as:  ELIQUIS  Take 1 tablet (2.5 mg total) by mouth 2 (two) times daily.     CALCIUM PO  Take 600 mg by mouth daily.     ferrous sulfate 325 (65 FE) MG tablet  Take 1 tablet (325 mg total) by mouth 2 (two) times daily with a meal.     furosemide 40 MG tablet  Commonly known as:  LASIX  Take 1 tablet (40 mg total) by mouth 2 (two) times daily. Took additional dose yesterday     levothyroxine 75 MCG tablet  Commonly known as:  SYNTHROID,  LEVOTHROID  Take 1 tablet (75 mcg total) by mouth daily.     loratadine 10 MG tablet  Commonly known as:  CLARITIN  Take 10 mg by mouth daily as needed for allergies.     metoprolol tartrate 25 MG tablet  Commonly known as:  LOPRESSOR  Take 1 tablet (25 mg total) by mouth 2 (two) times daily.     nitroGLYCERIN 0.4 MG SL tablet  Commonly known as:  NITROSTAT  Place 1 tablet (0.4 mg total) under the tongue every 5 (five) minutes as needed. For chest pain     senna 8.6 MG Tabs tablet  Commonly known as:  SENOKOT  Take 2 tablets (17.2 mg total) by mouth daily as needed for mild constipation.     simvastatin 10 MG tablet  Commonly known as:  ZOCOR  Take 1 tablet (10 mg total) by mouth at bedtime.       Allergies  Allergen Reactions  . Tramadol Nausea Only       Follow-up Information   Follow up with [facility]. (RN/PT/OT)    Contact information:   [address] [place] [phone]        The results of significant diagnostics from this hospitalization (including imaging, microbiology, ancillary and laboratory) are listed below for reference.    Significant Diagnostic Studies: Dg Chest 2 View  07/07/2014   CLINICAL DATA:  Shortness of breath with several week history of sinus drainage  EXAM: CHEST  2 VIEW  COMPARISON:  PA and lateral chest x-ray of July 05, 2014  FINDINGS: The lungs are adequately inflated. The cardiopericardial silhouette is enlarged. The left hemidiaphragm is obscured. The pulmonary vascularity is mildly prominent though stable. There is stable density lateral to the left heart border consistent with atelectasis or scarring. There is pleural fluid layering posteriorly on the left. The permanent pacemaker is in appropriate position. There is no acute bony thoracic abnormality.  IMPRESSION: Congestive heart failure with mild interstitial edema, left lower lobe atelectasis, and small left pleural effusion.   Electronically Signed   By: David  Martinique   On: 07/07/2014 16:01   Dg Chest 2 View  07/05/2014   CLINICAL DATA:  Diastolic heart failure, dyspnea and abdominal pain  EXAM: CHEST  2 VIEW  COMPARISON:  Prior chest x-ray 06/28/2014  FINDINGS: Stable position of right subclavian approach cardiac rhythm maintenance device with leads projecting over the right atrium and right ventricle. Unchanged globular cardiomegaly. Atherosclerotic calcifications present in the transverse aorta. Linear opacities in the left mid lung are similar compared to prior and may reflect atelectasis or scarring. Persistent mild elevation of the left hemidiaphragm. Is no new focal airspace consolidation, pulmonary edema or pneumothorax. Stable central bronchitic changes and diffuse mild interstitial prominence. No acute osseous abnormality. The visualized upper abdominal bowel gas pattern is unremarkable.  IMPRESSION: Stable chest  x-ray without evidence of acute process.   Electronically Signed   By: Jacqulynn Cadet M.D.   On: 07/05/2014 09:06   Dg Chest 2 View  06/28/2014   CLINICAL DATA:  Short of breath.  Chest pressure.  EXAM: CHEST  2 VIEW  COMPARISON:  /14/15  FINDINGS: Moderate enlargement cardiac silhouette. No mediastinal or hilar masses. No lung consolidation or edema. No pleural effusion or pneumothorax.  Right anterior chest wall sequential pacemaker is stable in well positioned.  Bony thorax is diffusely demineralized.  IMPRESSION: No acute findings.  Stable appearance from the previous day's study.  Electronically Signed   By: Lajean Manes M.D.   On: 06/28/2014 11:32   Dg Chest 2 View  06/27/2014   CLINICAL DATA:  Right shoulder pain for 2 days.  EXAM: CHEST  2 VIEW  COMPARISON:  CT chest 04/25/2014. Single view of the chest 05/08/2014.  FINDINGS: Lung volumes are lower than on the comparison plain film of the chest with crowding of the bronchovascular structures. Heart size is enlarged but there is no pulmonary edema. No pneumothorax or pleural effusion.  IMPRESSION: Cardiomegaly without acute disease.   Electronically Signed   By: Inge Rise M.D.   On: 06/27/2014 19:19   Dg Lumbar Spine 2-3 Views  07/05/2014   CLINICAL DATA:  Back pain; known scoliosis  EXAM: LUMBAR SPINE - 2-3 VIEW  COMPARISON:  Coronal and sagittal images from and abdominal pelvic CT scan of July 20, 2012.  FINDINGS: There is moderate mid thoracic scoliosis with a rotatory component. There is mild disc space narrowing at all lumbar levels. There is no acute compression fracture. The observed portions of the sacrum are grossly normal. There is dense calcification in the wall of the abdominal aorta.  IMPRESSION: There is levoscoliosis of the lumbar spine with moderate degenerative disc and facet joint change.   Electronically Signed   By: David  Martinique   On: 07/05/2014 19:38   Dg Chest Port 1 View  07/12/2014   CLINICAL DATA:   Shortness of breath  EXAM: PORTABLE CHEST - 1 VIEW  COMPARISON:  8/24/ 2015  FINDINGS: Cardiac shadow is enlarged. A pacing device is again seen. Lungs are well aerated. Mild left basilar atelectasis is again seen. No pneumothorax is noted. No bony abnormality is noted.  IMPRESSION: Persistent left basilar atelectasis.   Electronically Signed   By: Inez Catalina M.D.   On: 07/12/2014 16:31    Microbiology: No results found for this or any previous visit (from the past 240 hour(s)).   Labs: Basic Metabolic Panel:  Recent Labs Lab 07/08/14 0430 07/09/14 0444 07/10/14 0806 07/13/14 1250 07/14/14 0705  NA 135* 136* 137 137 138  K 4.3 4.5 4.2 3.9 4.1  CL 98 99 96 96 97  CO2 27 27 29 31 29   GLUCOSE 108* 96 97 163* 87  BUN 23 25* 24* 20 21  CREATININE 1.52* 1.44* 1.40* 1.42* 1.46*  CALCIUM 9.5 9.6 9.4 9.5 9.2   Liver Function Tests: No results found for this basename: AST, ALT, ALKPHOS, BILITOT, PROT, ALBUMIN,  in the last 168 hours No results found for this basename: LIPASE, AMYLASE,  in the last 168 hours No results found for this basename: AMMONIA,  in the last 168 hours CBC:  Recent Labs Lab 07/10/14 0806 07/11/14 0549 07/12/14 0333 07/13/14 0402 07/14/14 0705  WBC 5.3 5.5 6.0 5.7 5.8  HGB 10.1* 9.3* 9.2* 9.5* 9.8*  HCT 33.3* 30.3* 30.7* 31.2* 31.9*  MCV 87.4 87.3 85.8 87.6 85.3  PLT 222 201 202 197 217   Cardiac Enzymes: No results found for this basename: CKTOTAL, CKMB, CKMBINDEX, TROPONINI,  in the last 168 hours BNP: BNP (last 3 results)  Recent Labs  06/16/14 1237 06/27/14 1721 07/04/14 1349  PROBNP 543.0* 1950.0* 2180.0*   CBG: No results found for this basename: GLUCAP,  in the last 168 hours     Signed:  Rober Skeels  [facility] 07/14/2014, 10:46 AM

## 2014-07-14 NOTE — Progress Notes (Signed)
Physical Therapy Treatment Patient Details Name: [name] MRN: [id number] DOB: [birth year] Today's Date: 07/14/2014    History of Present Illness Pt is 80 yo female admiited due to sudden onset of dyspnea while watching tv. Pt recently admitted witha ctue on chronic diastolic CHF exacerbation d/c'd home 04/14/14. Pt with PMH of HTN, chest pain, DJD, thrombocytopenia, aneamia, CHF and Afib. Pt now s/p pacemaker placement on 04/23/14.     PT Comments    *Pt ambulated 200' with 2L O2 and RW. No LOB. Supervision for transfers. **  Follow Up Recommendations  Home health PT;Supervision - Intermittent     Equipment Recommendations  Hospital bed (home O2 )    Recommendations for Other Services       Precautions / Restrictions Precautions Precautions: Fall Precaution Comments: monitor O2 Restrictions Weight Bearing Restrictions: No    Mobility  Bed Mobility               General bed mobility comments: up in recliner on arrival  Transfers Overall transfer level: Needs assistance Equipment used: Rolling walker (2 wheeled) Transfers: Sit to/from Stand Sit to Stand: Supervision         General transfer comment: Needed cues for hand placement.  Ambulation/Gait Ambulation/Gait assistance: Supervision Ambulation Distance (Feet): 200 Feet Assistive device: Rolling walker (2 wheeled) Gait Pattern/deviations: Step-through pattern;Decreased stride length;Trunk flexed     General Gait Details: Pt ambulated with 2L O2, 1/4 dyspnea. Noted per nursing that she walked earlier this morning and desaturated to 83% on RA. No LOB with walking.   Stairs            Wheelchair Mobility    Modified Rankin (Stroke Patients Only)       Balance     Sitting balance-Leahy Scale: Good       Standing balance-Leahy Scale: Poor                 High Level Balance Comments: can perform these activities in controlled environment with RW with supervision.  No LOB  noted.      Cognition Arousal/Alertness: Awake/alert Behavior During Therapy: WFL for tasks assessed/performed Overall Cognitive Status: Within Functional Limits for tasks assessed                      Exercises      General Comments        Pertinent Vitals/Pain Pain Assessment: No/denies pain    Home Living                      Prior Function            PT Goals (current goals can now be found in the care plan section) Acute Rehab PT Goals Patient Stated Goal: to go home PT Goal Formulation: With patient Time For Goal Achievement: 07/19/14 Potential to Achieve Goals: Good Progress towards PT goals: Progressing toward goals    Frequency  Min 3X/week    PT Plan Current plan remains appropriate    Co-evaluation             End of Session Equipment Utilized During Treatment: Gait belt;Oxygen Activity Tolerance: Patient limited by fatigue Patient left: in chair;with call bell/phone within reach;with nursing/sitter in room     Time: 1140-1155 PT Time Calculation (min): 15 min  Charges:  $Gait Training: 8-22 mins                    G Codes:  [name] 07/14/2014, 12:08 PM [phone]

## 2014-07-17 ENCOUNTER — Telehealth: Payer: Self-pay | Admitting: Internal Medicine

## 2014-07-17 NOTE — Telephone Encounter (Signed)
New message  Tim with [facility] called, will need verbal orders for resumption of care for the pt.. Once verbal orders are received,  will fax back for a signature. Please call

## 2014-07-17 NOTE — Progress Notes (Signed)
Patient ID: [name], female   DOB: 06/18/1925, 80 y.o.   MRN: [id number]  PCP: Dr Netty Starring   HPI: [name] is a 80 y.o. female with the history of nonobstructive CAD by cardiac catheterization in 01/2014, persistent atrial fibrillation, diastolic CHF, HTN, HL, anemia, and CKD. Multiple admits for HF  Admitted to [facility] August 21 with increased dyspnea. Diuresed with IV lasix and transitioned to po lasix 40 mg twice a day. GI evaluated due to anemia and black stool. Had EGD with no acute findings. Colonoscopy was cancelled per daughter due to multiple medical problems. She continued on eliquis 2.5 mg twice a day.  Palliative Care consulted and the patient and family request aggressive care for heart failure. Discharge weight was 152 pounds.   She returns for follow up. Complains of fatigue. Complaining of hand tremors. Appetite ok. Uses oxygen at bed time and as needed. Has episodes of anxiety . Weight at home 150-152 pounds. Ambulates with rolling walker. [facility] following for RN/PT. Taking all medications. Has hospital bed.   Studies:  - LHC (3/15): Mid LM 20-30%, mid RCA 30-40%, EF 65%  - Echo (4/15): Mild LVH, EF 60-65%, no RWMA, mild BAE, trivial eff  - Carotid US (5/15): 40-59% RICA stenosis. 6-19% LICA stenosis.    Labs 07/14/14 K 4.1 Creatinine 1.46  Hgb 9.8  Labs 07/04/14 TSH 2.7  ROS: All systems negative except as listed in HPI, PMH and Problem List.  Past Medical History  Diagnosis Date  . Dyslipidemia   . Hypertension   . Palpitations     PVCs/bigeminy on event in May 2009 revealing this was relatively asymptomatic  . Chest pain     a. 2011 Neg MV;  b. 01/2014 Cath: LM 20-30, LAD nl, D1 nl, LCX nl, OM1 nl, RCA dom 30-82m, PD/PL nl, EF 65%->Med Rx.  . Degenerative joint disease   . Hx of varicella   . History of skin cancer     tafeen/ non melanoma.   . Thrombocytopenia   . Anemia   . Monoclonal gammopathies   . Right lower quadrant abdominal pain 07/19/2012     Recurrent  With nausea    This is new since she had ct for llq pain in [facility]    R/o appendiceal problem  Hernia  Get ct scan  And plan  Fu     . Diastolic CHF, acute on chronic 01/31/2012  . Atrial fibrillation     a. Dx 01/2014->Eliquis started.  . Cancer   . Pacemaker   . Dysrhythmia     Current Outpatient Prescriptions  Medication Sig Dispense Refill  . amiodarone (PACERONE) 200 MG tablet Take 1 tablet (200 mg total) by mouth 2 (two) times daily. BID for 2 weeks then 1 daily  60 tablet  0  . apixaban (ELIQUIS) 2.5 MG TABS tablet Take 1 tablet (2.5 mg total) by mouth 2 (two) times daily.      Marland Kitchen CALCIUM PO Take 600 mg by mouth daily.       . ferrous sulfate 325 (65 FE) MG tablet Take 1 tablet (325 mg total) by mouth 2 (two) times daily with a meal.  60 tablet  1  . furosemide (LASIX) 40 MG tablet Take 1 tablet (40 mg total) by mouth 2 (two) times daily. Took additional dose yesterday  60 tablet  0  . levothyroxine (SYNTHROID, LEVOTHROID) 75 MCG tablet Take 1 tablet (75 mcg total) by mouth daily.  90 tablet  2  . loratadine (CLARITIN) 10 MG tablet Take 10 mg by mouth daily as needed for allergies.       . metoprolol tartrate (LOPRESSOR) 25 MG tablet Take 1 tablet (25 mg total) by mouth 2 (two) times daily.  30 tablet  0  . nitroGLYCERIN (NITROSTAT) 0.4 MG SL tablet Place 1 tablet (0.4 mg total) under the tongue every 5 (five) minutes as needed. For chest pain  25 tablet  6  . senna (SENOKOT) 8.6 MG TABS tablet Take 2 tablets (17.2 mg total) by mouth daily as needed for mild constipation.  20 each  0  . simvastatin (ZOCOR) 10 MG tablet Take 1 tablet (10 mg total) by mouth at bedtime.       No current facility-administered medications for this encounter.     PHYSICAL EXAM: Filed Vitals:   07/18/14 0852  BP: 106/68  Pulse: 72  Weight: 151 lb (68.493 kg)  SpO2: 97%   Standing SBP 110 Sitting SBP 110  General:  Elderly Chronically ill appearing. No resp difficulty. Ambulated in the  clinic with rolling walker  HEENT: normal Neck: supple. JVP 7-8. Carotids 2+ bilaterally; no bruits. No lymphadenopathy or thryomegaly appreciated. Cor: PMI normal. Irregular rate & rhythm. No rubs, gallops or murmurs. Lungs: clear Abdomen: Obese, soft, nontender, nondistended. No hepatosplenomegaly. No bruits or masses. Good bowel sounds. Extremities: no cyanosis, clubbing, rash, edema Neuro: alert & orientedx3, cranial nerves grossly intact. Moves all 4 extremities w/o difficulty. Affect pleasant.      ASSESSMENT & PLAN:  1. Chronic Diastolic Heart Failure Reviewed D/C summary from 06/16/14 .  NYHA III. Volume status stable. Continue lasix 40 mg twice a day. Discussed sliding scale diuretics. Instructed to take an extra 40 mg of lasix for weight 156 pounds or greater.  Continue oxygen as needed for dyspnea.  Reinforced daily weights, low salt food choices, and limiting fluid intake < 2 liters per day.  Check BMET/Pro BNP now. 2. Iron Def Anemia- on Iron. No BRBPR. Follow up with PCP 3. Chronic A fib- Rate controlled. on eliquis 2.5 mg twice a day. Cut back amiodarone to 200 mg daily with had tremors. Continue lopressor 25 mg twice a day   I have asked her to schedule follow up with PCP.   Kelcy Laible NP-C  9:39 AM

## 2014-07-17 NOTE — Telephone Encounter (Signed)
Spoke with Tim from [facility]. Gave OK to continue [place] after recent hospitalization, but to change the order to Dr. Dorris Carnes (primary cardiologist) and to fax the new order to the office. Tim agreed and said the fax would be sent today.

## 2014-07-18 ENCOUNTER — Ambulatory Visit (HOSPITAL_COMMUNITY)
Admit: 2014-07-18 | Discharge: 2014-07-18 | Disposition: A | Discharge status: Home / Self Care | Payer: Medicare Other | Source: Ambulatory Visit | Attending: Internal Medicine | Admitting: Internal Medicine

## 2014-07-18 ENCOUNTER — Other Ambulatory Visit (HOSPITAL_COMMUNITY): Payer: Self-pay

## 2014-07-18 ENCOUNTER — Telehealth (HOSPITAL_COMMUNITY): Payer: Self-pay

## 2014-07-18 VITALS — BP 106/68 | HR 72 | Wt 151.0 lb

## 2014-07-18 DIAGNOSIS — D509 Iron deficiency anemia, unspecified: Secondary | ICD-10-CM | POA: Diagnosis not present

## 2014-07-18 DIAGNOSIS — I5033 Acute on chronic diastolic (congestive) heart failure: Secondary | ICD-10-CM | POA: Insufficient documentation

## 2014-07-18 DIAGNOSIS — I509 Heart failure, unspecified: Secondary | ICD-10-CM | POA: Diagnosis not present

## 2014-07-18 DIAGNOSIS — I10 Essential (primary) hypertension: Secondary | ICD-10-CM | POA: Insufficient documentation

## 2014-07-18 DIAGNOSIS — I4891 Unspecified atrial fibrillation: Secondary | ICD-10-CM | POA: Insufficient documentation

## 2014-07-18 DIAGNOSIS — E785 Hyperlipidemia, unspecified: Secondary | ICD-10-CM | POA: Diagnosis not present

## 2014-07-18 DIAGNOSIS — Z950 Presence of cardiac pacemaker: Secondary | ICD-10-CM | POA: Diagnosis not present

## 2014-07-18 DIAGNOSIS — Z7901 Long term (current) use of anticoagulants: Secondary | ICD-10-CM | POA: Insufficient documentation

## 2014-07-18 DIAGNOSIS — I482 Chronic atrial fibrillation, unspecified: Secondary | ICD-10-CM

## 2014-07-18 DIAGNOSIS — I5032 Chronic diastolic (congestive) heart failure: Secondary | ICD-10-CM | POA: Diagnosis not present

## 2014-07-18 DIAGNOSIS — Z85828 Personal history of other malignant neoplasm of skin: Secondary | ICD-10-CM | POA: Insufficient documentation

## 2014-07-18 LAB — BASIC METABOLIC PANEL
Anion gap: 12 (ref 5–15)
BUN: 20 mg/dL (ref 6–23)
CALCIUM: 9.1 mg/dL (ref 8.4–10.5)
CO2: 31 mEq/L (ref 19–32)
Chloride: 96 mEq/L (ref 96–112)
Creatinine, Ser: 1.44 mg/dL — ABNORMAL HIGH (ref 0.50–1.10)
GFR calc Af Amer: 37 mL/min — ABNORMAL LOW (ref >90)
GFR, EST NON AFRICAN AMERICAN: 32 mL/min — AB (ref >90)
Glucose, Bld: 130 mg/dL — ABNORMAL HIGH (ref 70–99)
Potassium: 3.5 mEq/L — ABNORMAL LOW (ref 3.7–5.3)
Sodium: 139 mEq/L (ref 137–147)

## 2014-07-18 LAB — CBC
HEMATOCRIT: 34.3 % — AB (ref 36.0–46.0)
HEMOGLOBIN: 10.7 g/dL — AB (ref 12.0–15.0)
MCH: 26.4 pg (ref 26.0–34.0)
MCHC: 31.2 g/dL (ref 30.0–36.0)
MCV: 84.5 fL (ref 78.0–100.0)
Platelets: 220 10*3/uL (ref 150–400)
RBC: 4.06 MIL/uL (ref 3.87–5.11)
RDW: 16.6 % — ABNORMAL HIGH (ref 11.5–15.5)
WBC: 5.5 10*3/uL (ref 4.0–10.5)

## 2014-07-18 LAB — PRO B NATRIURETIC PEPTIDE: Pro B Natriuretic peptide (BNP): 2626 pg/mL — ABNORMAL HIGH (ref 0–450)

## 2014-07-18 MED ORDER — POTASSIUM CHLORIDE CRYS ER 20 MEQ PO TBCR: 20.0000 meq | EXTENDED_RELEASE_TABLET | Freq: Every day | ORAL | Status: DC

## 2014-07-18 MED ORDER — AMIODARONE HCL 200 MG PO TABS: 200.0000 mg | ORAL_TABLET | Freq: Every day | ORAL | Status: DC

## 2014-07-18 MED ORDER — FUROSEMIDE 40 MG PO TABS: 40.0000 mg | ORAL_TABLET | Freq: Two times a day (BID) | ORAL | Status: DC

## 2014-07-18 NOTE — Telephone Encounter (Signed)
Lab results reviewed with patient, instructed to start taking potassium 20 meq tablet once daily.  Aware and agreeable.  Rx sent to preferred pharmacy electronically. [name]

## 2014-07-18 NOTE — Patient Instructions (Addendum)
Follow up in 2 weeks  Take amiodarone 200 mg daily  Take an extra 40 mg of lasix if your weight is 156 pounds or greater.   Do the following things EVERYDAY: 1) Weigh yourself in the morning before breakfast. Write it down and keep it in a log. 2) Take your medicines as prescribed 3) Eat low salt foods-Limit salt (sodium) to 2000 mg per day.  4) Stay as active as you can everyday 5) Limit all fluids for the day to less than 2 liters

## 2014-07-25 ENCOUNTER — Inpatient Hospital Stay (HOSPITAL_COMMUNITY): Admission: RE | Admit: 2014-07-25 | Payer: Medicare Other | Source: Ambulatory Visit

## 2014-07-25 ENCOUNTER — Other Ambulatory Visit: Payer: Medicare Other

## 2014-07-31 NOTE — Progress Notes (Signed)
Patient ID: [name], female   DOB: Sep 27, 1925, 80 y.o.   MRN: [id number]  PCP: Dr Netty Starring   HPI: [name] is a 80 y.o. female with the history of nonobstructive CAD by cardiac catheterization in 01/2014, persistent atrial fibrillation, diastolic CHF, HTN, HL, anemia, and CKD. Multiple admits for HF  Admitted to [facility] August 21 with increased dyspnea. Diuresed with IV lasix and transitioned to po lasix 40 mg twice a day. GI evaluated due to anemia and black stool. Had EGD with no acute findings. Colonoscopy was cancelled per daughter due to multiple medical problems. She continued on eliquis 2.5 mg twice a day.  Palliative Care consulted and the patient and family request aggressive care for heart failure. Discharge weight was 152 pounds.   She returns for follow up. Last visit amiodarone was cut back to 200 mg daily.  Says she has good days and bad days. Intermittent dyspnea with exertion. Uses oxygen at night. Weight at 144-148 pounds. Ambulates with rolling walker. [facility] following for RN/PT. Taking all medications. Has hospital bed. Lives alone.   Studies:  - LHC (3/15): Mid LM 20-30%, mid RCA 30-40%, EF 65%  - Echo (4/15): Mild LVH, EF 60-65%, no RWMA, mild BAE, trivial eff  - Carotid US (5/15): 40-59% RICA stenosis. 4-09% LICA stenosis.   Labs 07/18/14 K 3.5 Creatinine 1.44 Hgb 10.7  Labs 07/14/14 K 4.1 Creatinine 1.46  Hgb 9.8  Labs 07/04/14 TSH 2.7  ROS: All systems negative except as listed in HPI, PMH and Problem List.  Past Medical History  Diagnosis Date  . Dyslipidemia   . Hypertension   . Palpitations     PVCs/bigeminy on event in May 2009 revealing this was relatively asymptomatic  . Chest pain     a. 2011 Neg MV;  b. 01/2014 Cath: LM 20-30, LAD nl, D1 nl, LCX nl, OM1 nl, RCA dom 30-55m, PD/PL nl, EF 65%->Med Rx.  . Degenerative joint disease   . Hx of varicella   . History of skin cancer     tafeen/ non melanoma.   . Thrombocytopenia   . Anemia   .  Monoclonal gammopathies   . Right lower quadrant abdominal pain 07/19/2012    Recurrent  With nausea    This is new since she had ct for llq pain in [facility]    R/o appendiceal problem  Hernia  Get ct scan  And plan  Fu     . Diastolic CHF, acute on chronic 01/31/2012  . Atrial fibrillation     a. Dx 01/2014->Eliquis started.  . Cancer   . Pacemaker   . Dysrhythmia     Current Outpatient Prescriptions  Medication Sig Dispense Refill  . amiodarone (PACERONE) 200 MG tablet Take 1 tablet (200 mg total) by mouth daily.  30 tablet  6  . apixaban (ELIQUIS) 2.5 MG TABS tablet Take 1 tablet (2.5 mg total) by mouth 2 (two) times daily.      Marland Kitchen CALCIUM PO Take 600 mg by mouth daily.       . ferrous sulfate 325 (65 FE) MG tablet Take 1 tablet (325 mg total) by mouth 2 (two) times daily with a meal.  60 tablet  1  . furosemide (LASIX) 40 MG tablet Take 1 tablet (40 mg total) by mouth 2 (two) times daily. Take an extra 40 mg of lasix if your weight is 156 pounds or greater  70 tablet  6  . levothyroxine (SYNTHROID, LEVOTHROID) 75  MCG tablet Take 1 tablet (75 mcg total) by mouth daily.  90 tablet  2  . loratadine (CLARITIN) 10 MG tablet Take 10 mg by mouth daily as needed for allergies.       . metoprolol tartrate (LOPRESSOR) 25 MG tablet Take 1 tablet (25 mg total) by mouth 2 (two) times daily.  30 tablet  0  . potassium chloride SA (KLOR-CON M20) 20 MEQ tablet Take 1 tablet (20 mEq total) by mouth daily.  90 tablet  3  . senna (SENOKOT) 8.6 MG TABS tablet Take 2 tablets (17.2 mg total) by mouth daily as needed for mild constipation.  20 each  0  . simvastatin (ZOCOR) 10 MG tablet Take 1 tablet (10 mg total) by mouth at bedtime.      . nitroGLYCERIN (NITROSTAT) 0.4 MG SL tablet Place 1 tablet (0.4 mg total) under the tongue every 5 (five) minutes as needed. For chest pain  25 tablet  6   No current facility-administered medications for this encounter.     PHYSICAL EXAM: Filed Vitals:   08/01/14 1115  BP:  114/59  Pulse: 85  Weight: 145 lb (65.772 kg)  SpO2: 99%   General:  Elderly Chronically ill appearing. No resp difficulty. Ambulated in the clinic with rolling walker  HEENT: normal Neck: supple. JVP flat. Carotids 2+ bilaterally; no bruits. No lymphadenopathy or thryomegaly appreciated. Cor: PMI normal. Irregular rate & rhythm. No rubs, gallops or murmurs. Lungs: clear Abdomen: Obese, soft, nontender, nondistended. No hepatosplenomegaly. No bruits or masses. Good bowel sounds. Extremities: no cyanosis, clubbing, rash, edema Neuro: alert & orientedx3, cranial nerves grossly intact. Moves all 4 extremities w/o difficulty. Affect pleasant.      ASSESSMENT & PLAN:  1. Chronic Diastolic Heart Failure NYHA III. Volume status low .  Weight down 6 pounds form last visit. Cut back lasix to 40 mg daily. Discussed sliding scale diuretics. I readjusted her sliding scale diuretics today. Instructed to take an extra 40 mg of lasix for weight 150  pounds or greater.  Continue oxygen as needed for dyspnea.  Reinforced daily weights, low salt food choices, and limiting fluid intake < 2 liters per day.  Continue  2. Iron Def Anemia- on Iron. No BRBPR. Follow up with PCP 3. Chronic A fib- Rate controlled. on eliquis 2.5 mg twice a day. Continue amiodarone to 200 mg daily. Continue lopressor 25 mg twice a day.   Follow up 6 weeks to reassess volume status.   CLEGG,AMY NP-C  11:19 AM

## 2014-08-01 ENCOUNTER — Other Ambulatory Visit: Payer: Medicare Other | Admitting: Lab

## 2014-08-01 ENCOUNTER — Ambulatory Visit: Payer: Medicare Other | Admitting: Hematology and Oncology

## 2014-08-01 ENCOUNTER — Ambulatory Visit (HOSPITAL_COMMUNITY)
Admission: RE | Admit: 2014-08-01 | Discharge: 2014-08-01 | Disposition: A | Discharge status: Home / Self Care | Payer: Medicare Other | Source: Ambulatory Visit | Attending: Internal Medicine | Admitting: Internal Medicine

## 2014-08-01 VITALS — BP 114/59 | HR 85 | Wt 145.0 lb

## 2014-08-01 DIAGNOSIS — Z85828 Personal history of other malignant neoplasm of skin: Secondary | ICD-10-CM | POA: Diagnosis not present

## 2014-08-01 DIAGNOSIS — R002 Palpitations: Secondary | ICD-10-CM | POA: Insufficient documentation

## 2014-08-01 DIAGNOSIS — I480 Paroxysmal atrial fibrillation: Secondary | ICD-10-CM

## 2014-08-01 DIAGNOSIS — Z950 Presence of cardiac pacemaker: Secondary | ICD-10-CM | POA: Diagnosis not present

## 2014-08-01 DIAGNOSIS — Z7901 Long term (current) use of anticoagulants: Secondary | ICD-10-CM | POA: Diagnosis not present

## 2014-08-01 DIAGNOSIS — I4949 Other premature depolarization: Secondary | ICD-10-CM | POA: Insufficient documentation

## 2014-08-01 DIAGNOSIS — R079 Chest pain, unspecified: Secondary | ICD-10-CM | POA: Diagnosis not present

## 2014-08-01 DIAGNOSIS — Z886 Allergy status to analgesic agent status: Secondary | ICD-10-CM | POA: Insufficient documentation

## 2014-08-01 DIAGNOSIS — E039 Hypothyroidism, unspecified: Secondary | ICD-10-CM | POA: Insufficient documentation

## 2014-08-01 DIAGNOSIS — K219 Gastro-esophageal reflux disease without esophagitis: Secondary | ICD-10-CM | POA: Diagnosis not present

## 2014-08-01 DIAGNOSIS — R0609 Other forms of dyspnea: Secondary | ICD-10-CM | POA: Diagnosis present

## 2014-08-01 DIAGNOSIS — I499 Cardiac arrhythmia, unspecified: Secondary | ICD-10-CM | POA: Insufficient documentation

## 2014-08-01 DIAGNOSIS — I5032 Chronic diastolic (congestive) heart failure: Secondary | ICD-10-CM | POA: Diagnosis present

## 2014-08-01 DIAGNOSIS — I2510 Atherosclerotic heart disease of native coronary artery without angina pectoris: Secondary | ICD-10-CM | POA: Insufficient documentation

## 2014-08-01 DIAGNOSIS — D696 Thrombocytopenia, unspecified: Secondary | ICD-10-CM | POA: Insufficient documentation

## 2014-08-01 DIAGNOSIS — I4891 Unspecified atrial fibrillation: Secondary | ICD-10-CM | POA: Diagnosis not present

## 2014-08-01 DIAGNOSIS — N183 Chronic kidney disease, stage 3 unspecified: Secondary | ICD-10-CM | POA: Diagnosis not present

## 2014-08-01 DIAGNOSIS — D472 Monoclonal gammopathy: Secondary | ICD-10-CM | POA: Insufficient documentation

## 2014-08-01 DIAGNOSIS — D509 Iron deficiency anemia, unspecified: Secondary | ICD-10-CM | POA: Diagnosis not present

## 2014-08-01 DIAGNOSIS — E785 Hyperlipidemia, unspecified: Secondary | ICD-10-CM | POA: Insufficient documentation

## 2014-08-01 DIAGNOSIS — M1990 Unspecified osteoarthritis, unspecified site: Secondary | ICD-10-CM | POA: Diagnosis not present

## 2014-08-01 DIAGNOSIS — Z79899 Other long term (current) drug therapy: Secondary | ICD-10-CM | POA: Insufficient documentation

## 2014-08-01 DIAGNOSIS — I252 Old myocardial infarction: Secondary | ICD-10-CM | POA: Diagnosis not present

## 2014-08-01 DIAGNOSIS — I509 Heart failure, unspecified: Secondary | ICD-10-CM | POA: Insufficient documentation

## 2014-08-01 DIAGNOSIS — IMO0002 Reserved for concepts with insufficient information to code with codable children: Secondary | ICD-10-CM | POA: Diagnosis not present

## 2014-08-01 DIAGNOSIS — I129 Hypertensive chronic kidney disease with stage 1 through stage 4 chronic kidney disease, or unspecified chronic kidney disease: Secondary | ICD-10-CM | POA: Insufficient documentation

## 2014-08-01 DIAGNOSIS — R0989 Other specified symptoms and signs involving the circulatory and respiratory systems: Secondary | ICD-10-CM | POA: Diagnosis present

## 2014-08-01 MED ORDER — FUROSEMIDE 40 MG PO TABS: 40.0000 mg | ORAL_TABLET | Freq: Every day | ORAL | Status: DC

## 2014-08-01 NOTE — Patient Instructions (Signed)
Follow up in 6 weeks  Please cut back lasix to 40 mg daily. Take an extra 40 mg of lasix for weight 150 pounds or greater.   Do the following things EVERYDAY: 1) Weigh yourself in the morning before breakfast. Write it down and keep it in a log. 2) Take your medicines as prescribed 3) Eat low salt foods-Limit salt (sodium) to 2000 mg per day.  4) Stay as active as you can everyday 5) Limit all fluids for the day to less than 2 liters

## 2014-08-06 ENCOUNTER — Telehealth: Payer: Self-pay | Admitting: Internal Medicine

## 2014-08-06 NOTE — Telephone Encounter (Signed)
Pt need a 30 minute hosp fup . Can we work her in.

## 2014-08-07 ENCOUNTER — Ambulatory Visit: Payer: Medicare Other | Admitting: Family Medicine

## 2014-08-07 NOTE — Telephone Encounter (Signed)
Arville Go called to ask for appt for pt. He (jim) feels like pt is not doing as well as she should.  Pt has nausea, stomach pain and just released from hosp w/ heart failure. I blocked the 2:30 Monday afternoon in case it is OK to work pt in. Pt aware we will call her Monday. Pt could not get a ride into office this week to see another provider. Pt aware WP out of office this week. Pt aware she can be seen by someone, but pt refused due to not having a way here.

## 2014-08-11 ENCOUNTER — Ambulatory Visit (INDEPENDENT_AMBULATORY_CARE_PROVIDER_SITE_OTHER): Payer: Medicare Other | Admitting: Internal Medicine

## 2014-08-11 ENCOUNTER — Encounter: Payer: Self-pay | Admitting: Internal Medicine

## 2014-08-11 ENCOUNTER — Ambulatory Visit: Payer: Medicare Other | Admitting: Internal Medicine

## 2014-08-11 VITALS — BP 106/58 | HR 69 | Temp 98.2°F | Wt 147.1 lb

## 2014-08-11 DIAGNOSIS — D509 Iron deficiency anemia, unspecified: Secondary | ICD-10-CM

## 2014-08-11 DIAGNOSIS — K2970 Gastritis, unspecified, without bleeding: Secondary | ICD-10-CM

## 2014-08-11 DIAGNOSIS — I5032 Chronic diastolic (congestive) heart failure: Secondary | ICD-10-CM

## 2014-08-11 DIAGNOSIS — N183 Chronic kidney disease, stage 3 unspecified: Secondary | ICD-10-CM

## 2014-08-11 DIAGNOSIS — R198 Other specified symptoms and signs involving the digestive system and abdomen: Secondary | ICD-10-CM | POA: Insufficient documentation

## 2014-08-11 DIAGNOSIS — I4891 Unspecified atrial fibrillation: Secondary | ICD-10-CM

## 2014-08-11 DIAGNOSIS — E876 Hypokalemia: Secondary | ICD-10-CM | POA: Insufficient documentation

## 2014-08-11 DIAGNOSIS — Z7901 Long term (current) use of anticoagulants: Secondary | ICD-10-CM

## 2014-08-11 DIAGNOSIS — Z950 Presence of cardiac pacemaker: Secondary | ICD-10-CM

## 2014-08-11 DIAGNOSIS — I482 Chronic atrial fibrillation, unspecified: Secondary | ICD-10-CM

## 2014-08-11 DIAGNOSIS — K2990 Gastroduodenitis, unspecified, without bleeding: Secondary | ICD-10-CM

## 2014-08-11 LAB — BASIC METABOLIC PANEL
BUN: 24 mg/dL — AB (ref 6–23)
CO2: 29 mEq/L (ref 19–32)
CREATININE: 1.7 mg/dL — AB (ref 0.4–1.2)
Calcium: 9.5 mg/dL (ref 8.4–10.5)
Chloride: 98 mEq/L (ref 96–112)
GFR: 31.25 mL/min — ABNORMAL LOW (ref >60.00)
GLUCOSE: 109 mg/dL — AB (ref 70–99)
Potassium: 4.6 mEq/L (ref 3.5–5.1)
Sodium: 134 mEq/L — ABNORMAL LOW (ref 135–145)

## 2014-08-11 LAB — CBC WITH DIFFERENTIAL/PLATELET
BASOS PCT: 0.6 % (ref 0.0–3.0)
Basophils Absolute: 0 10*3/uL (ref 0.0–0.1)
EOS ABS: 0 10*3/uL (ref 0.0–0.7)
Eosinophils Relative: 0.1 % (ref 0.0–5.0)
HCT: 35.7 % — ABNORMAL LOW (ref 36.0–46.0)
Hemoglobin: 11.6 g/dL — ABNORMAL LOW (ref 12.0–15.0)
Lymphocytes Relative: 10.6 % — ABNORMAL LOW (ref 12.0–46.0)
Lymphs Abs: 0.7 10*3/uL (ref 0.7–4.0)
MCHC: 32.4 g/dL (ref 30.0–36.0)
MCV: 83.1 fl (ref 78.0–100.0)
MONO ABS: 0.9 10*3/uL (ref 0.1–1.0)
Monocytes Relative: 12.6 % — ABNORMAL HIGH (ref 3.0–12.0)
NEUTROS PCT: 76.1 % (ref 43.0–77.0)
Neutro Abs: 5.2 10*3/uL (ref 1.4–7.7)
PLATELETS: 222 10*3/uL (ref 150.0–400.0)
RBC: 4.3 Mil/uL (ref 3.87–5.11)
RDW: 20.1 % — ABNORMAL HIGH (ref 11.5–15.5)
WBC: 6.9 10*3/uL (ref 4.0–10.5)

## 2014-08-11 LAB — MAGNESIUM: Magnesium: 2.1 mg/dL (ref 1.5–2.5)

## 2014-08-11 MED ORDER — PANTOPRAZOLE SODIUM 40 MG PO TBEC: 40.0000 mg | DELAYED_RELEASE_TABLET | Freq: Every day | ORAL | Status: DC

## 2014-08-11 NOTE — Progress Notes (Signed)
Pre visit review using our clinic review tool, if applicable. No additional management support is needed unless otherwise documented below in the visit note.   Chief Complaint  Patient presents with  . Hospitalization Follow-up    Seen for ongoing heart failure and gastritis.  Pt complains today of nausea and stomach pain.    HPI: [name] comes in for work in appt for  Post hoospital gi sx.   ? August 31st  She has had recurrent significant heart failure and is now in the heart failure clinic maintaining since her hospital discharge. She was admitted with dark colored schools that were heme negative but she was anemic had an upper endoscopy by Dr. Fuller Plan who said gastritis stay on PPI. She was discharged with no PPI according to the patient Since a few days after discharge she is having more problems with her stomach complaining of discomfort After eating   Feel like stomach bloatin in in and pressure.  No active bleeding known Home health comes out [place]  PT 2 x and  OT.   And nursing  ROS: See pertinent positives and negatives per HPI. No new cp sob comes and goes .  Uncertain if there is anxiety cardiology in hospital is safe talk to your primary about the intermittent shortness of breath. No change in cognition per patient or specific weakness.  Past Medical History  Diagnosis Date  . Dyslipidemia   . Hypertension   . Palpitations     PVCs/bigeminy on event in May 2009 revealing this was relatively asymptomatic  . Chest pain     a. 2011 Neg MV;  b. 01/2014 Cath: LM 20-30, LAD nl, D1 nl, LCX nl, OM1 nl, RCA dom 30-56m, PD/PL nl, EF 65%->Med Rx.  . Degenerative joint disease   . Hx of varicella   . History of skin cancer     tafeen/ non melanoma.   . Thrombocytopenia   . Anemia   . Monoclonal gammopathies   . Right lower quadrant abdominal pain 07/19/2012    Recurrent  With nausea    This is new since she had ct for llq pain in [facility]    R/o appendiceal problem  Hernia   Get ct scan  And plan  Fu     . Diastolic CHF, acute on chronic 01/31/2012  . Atrial fibrillation     a. Dx 01/2014->Eliquis started.  . Cancer   . Pacemaker   . Dysrhythmia     Family History  Problem Relation Age of Onset  . Coronary artery disease Father   . Heart disease Father   . Lung cancer    . Alzheimer's disease Mother   . Cancer Brother 68    lung cancer  . Heart attack Father     History   Social History  . Marital Status: Widowed    Spouse Name: N/A    Number of Children: 3  . Years of Education: N/A   Occupational History  .      [employer], book keeping   Social History Main Topics  . Smoking status: Never Smoker   . Smokeless tobacco: Never Used  . Alcohol Use: No  . Drug Use: No  . Sexual Activity: Not Currently   Other Topics Concern  . 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 Eats with her.    Outpatient Encounter Prescriptions as of 08/11/2014  Medication Sig  . amiodarone (PACERONE) 200 MG tablet Take 1 tablet (200 mg total) by mouth daily.  Marland Kitchen apixaban (ELIQUIS) 2.5 MG TABS tablet Take 1 tablet (2.5 mg total) by mouth 2 (two) times daily.  Marland Kitchen CALCIUM PO Take 600 mg by mouth daily.   . ferrous sulfate 325 (65 FE) MG tablet Take 1 tablet (325 mg total) by mouth 2 (two) times daily with a meal.  . furosemide (LASIX) 40 MG tablet Take 1 tablet (40 mg total) by mouth daily. Take an extra 40 mg of lasix if your weight is 150 pounds or greater.  . levothyroxine (SYNTHROID, LEVOTHROID) 75 MCG tablet Take 1 tablet (75 mcg total) by mouth daily.  Marland Kitchen loratadine (CLARITIN) 10 MG tablet Take 10 mg by mouth daily as needed for allergies.   . metoprolol tartrate (LOPRESSOR) 25 MG tablet Take 1 tablet (25 mg total) by mouth 2 (two)  times daily.  . nitroGLYCERIN (NITROSTAT) 0.4 MG SL tablet Place 1 tablet (0.4 mg total) under the tongue every 5 (five) minutes as needed. For chest pain  . potassium chloride SA (KLOR-CON M20) 20 MEQ tablet Take 1 tablet (20 mEq total) by mouth daily.  Marland Kitchen senna (SENOKOT) 8.6 MG TABS tablet Take 2 tablets (17.2 mg total) by mouth daily as needed for mild constipation.  . simvastatin (ZOCOR) 10 MG tablet Take 1 tablet (10 mg total) by mouth at bedtime.  . pantoprazole (PROTONIX) 40 MG tablet Take 1 tablet (40 mg total) by mouth daily.    EXAM:  BP 106/58  Pulse 69  Temp(Src) 98.2 F (36.8 C) (Oral)  Wt 147 lb 1.6 oz (66.724 kg)  SpO2 93%  Body mass index is 30.75 kg/(m^2).  GENERAL: vitals reviewed and listed above, alert, oriented, appears well hydrated and in no acute distress ambulatory has a walker cognition appears intact as well as speech. She's able to get up onto the exam table by herself unassisted respirations at rest or quiet and unlabored HEENT: atraumatic, conjunctiva  clear, no obvious abnormalities on inspection of external nose and ears  NECK: no obvious masses on inspection palpation don't see JVD above 2 cm LUNGS: clear to auscultation bilaterally, no wheezes, rales or rhonchi,  CV: HRIRR, faint her heart sounds no clubbing cyanosis slight peripheral edema nl cap refill some varicose veins Abdomen soft without organomegaly obvious bowel sounds decreased at present some tenderness on palpation middle abdomen and epigastrium no guarding or rebound MS: moves all extremities without noticeable focal  Abnormality Skin no bruising senile sudden changes PSYCH: pleasant and cooperative, no obvious depression or anxiety Lab Results  Component Value Date   WBC 6.9 08/11/2014   HGB 11.6* 08/11/2014   HCT 35.7* 08/11/2014   PLT 222.0 08/11/2014   GLUCOSE 109* 08/11/2014   CHOL 141 04/14/2014   TRIG 56 04/14/2014   HDL 89 04/14/2014   LDLCALC 41 04/14/2014   ALT 10 07/04/2014   AST 14  07/04/2014   NA 134* 08/11/2014   K 4.6 08/11/2014   CL 98 08/11/2014   CREATININE 1.7* 08/11/2014   BUN 24* 08/11/2014   CO2 29 08/11/2014   TSH 2.760 07/04/2014   INR 1.53* 07/04/2014   extensive review of record and piecing her  history together from the hospital shows that she was on proton The hospital up to twice a day and was discharged without a PPI. Although endoscopist says remain on PPI.  ASSESSMENT AND PLAN:  Discussed the following assessment and plan:  Gastritis determined by endoscopy - Restart the proton X. and followup - Plan: Basic metabolic panel, Magnesium, CBC with Differential  Iron deficiency anemia - ? iron on chronic  ?  has low ibc - Plan: Basic metabolic panel, Magnesium, CBC with Differential  Hypokalemia - Plan: Basic metabolic panel, Magnesium, CBC with Differential  Chronic atrial fibrillation - amiodarone rx   Chronic renal disease, stage III  Pacemaker  GI symptoms  Chronic diastolic heart failure  Chronic anticoagulation - eliquis  Suspect that the potassium pills and the iron pills may be upsetting her stomach also. She doesn't appear to have decompensated heart failure today. Recheck for her anemia hemoglobin and her potassium level.  -Patient advised to return or notify health care team  if symptoms worsen ,persist or new concerns arise.  Patient Instructions  Will notify you  of labs when available. For anemia and potassium  Level. It appears you were on acid blocker protonix .  In the hospital  and was not discharge on this so we will restart this .  Plan fu depending on labs . Nausea can be caused but medication gastritis   Other situations. We may need Gi doctor stark to follow up if continues .   Gastritis, Adult Gastritis is soreness and swelling (inflammation) of the lining of the stomach. Gastritis can develop as a sudden onset (acute) or long-term (chronic) condition. If gastritis is not treated, it can lead to stomach bleeding and  ulcers. CAUSES  Gastritis occurs when the stomach lining is weak or damaged. Digestive juices from the stomach then inflame the weakened stomach lining. The stomach lining may be weak or damaged due to viral or bacterial infections. One common bacterial infection is the Helicobacter pylori infection. Gastritis can also result from excessive alcohol consumption, taking certain medicines, or having too much acid in the stomach.  SYMPTOMS  In some cases, there are no symptoms. When symptoms are present, they may include:  Pain or a burning sensation in the upper abdomen.  Nausea.  Vomiting.  An uncomfortable feeling of fullness after eating. DIAGNOSIS  Your caregiver may suspect you have gastritis based on your symptoms and a physical exam. To determine the cause of your gastritis, your caregiver may perform the following:  Blood or stool tests to check for the H pylori bacterium.  Gastroscopy. A thin, flexible tube (endoscope) is passed down the esophagus and into the stomach. The endoscope has a light and camera on the end. Your caregiver uses the endoscope to view the inside of the stomach.  Taking a tissue sample (biopsy) from the stomach to examine under a microscope. TREATMENT  Depending on the cause of your gastritis, medicines may be prescribed. If you have a bacterial infection, such as an H pylori infection, antibiotics may be given. If your gastritis is caused by too much acid in the stomach, H2 blockers or antacids may be given. Your caregiver may recommend that you stop taking aspirin, ibuprofen, or other nonsteroidal anti-inflammatory drugs (NSAIDs). HOME CARE INSTRUCTIONS  Only take over-the-counter or prescription medicines as directed by your caregiver.  If you were given antibiotic medicines, take them as directed. Finish them even if you start to feel better.  Drink enough fluids to keep your urine  clear or pale yellow.  Avoid foods and drinks that make your symptoms  worse, such as:  Caffeine or alcoholic drinks.  Chocolate.  Peppermint or mint flavorings.  Garlic and onions.  Spicy foods.  Citrus fruits, such as oranges, lemons, or limes.  Tomato-based foods such as sauce, chili, salsa, and pizza.  Fried and fatty foods.  Eat small, frequent meals instead of large meals. SEEK IMMEDIATE MEDICAL CARE IF:   You have black or dark red stools.  You vomit blood or material that looks like coffee grounds.  You are unable to keep fluids down.  Your abdominal pain gets worse.  You have a fever.  You do not feel better after 1 week.  You have any other questions or concerns. MAKE SURE YOU:  Understand these instructions.  Will watch your condition.  Will get help right away if you are not doing well or get worse. Document Released: 10/25/2001 Document Revised: 05/01/2012 Document Reviewed: 12/14/2011 [facility] Patient Information 2015 [place], [place]. This information is not intended to replace advice given to you by your health care provider. Make sure you discuss any questions you have with your health care provider.      Standley Brooking. Panosh M.D.  Total visit 40 mins > 50% spent counseling and coordinating care about gi sx adding med and follow up .   Has no gi fu and  She was supposed to be dced i suppose on the ppi that was given in hospital .

## 2014-08-11 NOTE — Patient Instructions (Signed)
Will notify you  of labs when available. For anemia and potassium  Level. It appears you were on acid blocker protonix .  In the hospital  and was not discharge on this so we will restart this .  Plan fu depending on labs . Nausea can be caused but medication gastritis   Other situations. We may need Gi doctor stark to follow up if continues .   Gastritis, Adult Gastritis is soreness and swelling (inflammation) of the lining of the stomach. Gastritis can develop as a sudden onset (acute) or long-term (chronic) condition. If gastritis is not treated, it can lead to stomach bleeding and ulcers. CAUSES  Gastritis occurs when the stomach lining is weak or damaged. Digestive juices from the stomach then inflame the weakened stomach lining. The stomach lining may be weak or damaged due to viral or bacterial infections. One common bacterial infection is the Helicobacter pylori infection. Gastritis can also result from excessive alcohol consumption, taking certain medicines, or having too much acid in the stomach.  SYMPTOMS  In some cases, there are no symptoms. When symptoms are present, they may include:  Pain or a burning sensation in the upper abdomen.  Nausea.  Vomiting.  An uncomfortable feeling of fullness after eating. DIAGNOSIS  Your caregiver may suspect you have gastritis based on your symptoms and a physical exam. To determine the cause of your gastritis, your caregiver may perform the following:  Blood or stool tests to check for the H pylori bacterium.  Gastroscopy. A thin, flexible tube (endoscope) is passed down the esophagus and into the stomach. The endoscope has a light and camera on the end. Your caregiver uses the endoscope to view the inside of the stomach.  Taking a tissue sample (biopsy) from the stomach to examine under a microscope. TREATMENT  Depending on the cause of your gastritis, medicines may be prescribed. If you have a bacterial infection, such as an H pylori  infection, antibiotics may be given. If your gastritis is caused by too much acid in the stomach, H2 blockers or antacids may be given. Your caregiver may recommend that you stop taking aspirin, ibuprofen, or other nonsteroidal anti-inflammatory drugs (NSAIDs). HOME CARE INSTRUCTIONS  Only take over-the-counter or prescription medicines as directed by your caregiver.  If you were given antibiotic medicines, take them as directed. Finish them even if you start to feel better.  Drink enough fluids to keep your urine clear or pale yellow.  Avoid foods and drinks that make your symptoms worse, such as:  Caffeine or alcoholic drinks.  Chocolate.  Peppermint or mint flavorings.  Garlic and onions.  Spicy foods.  Citrus fruits, such as oranges, lemons, or limes.  Tomato-based foods such as sauce, chili, salsa, and pizza.  Fried and fatty foods.  Eat small, frequent meals instead of large meals. SEEK IMMEDIATE MEDICAL CARE IF:   You have black or dark red stools.  You vomit blood or material that looks like coffee grounds.  You are unable to keep fluids down.  Your abdominal pain gets worse.  You have a fever.  You do not feel better after 1 week.  You have any other questions or concerns. MAKE SURE YOU:  Understand these instructions.  Will watch your condition.  Will get help right away if you are not doing well or get worse. Document Released: 10/25/2001 Document Revised: 05/01/2012 Document Reviewed: 12/14/2011 [facility] Patient Information 2015 [place], [place]. This information is not intended to replace advice given to you by  your health care provider. Make sure you discuss any questions you have with your health care provider.  

## 2014-08-12 NOTE — Telephone Encounter (Signed)
I was out of office when this message came in   Saw patient sda  On 9 28

## 2014-08-13 ENCOUNTER — Telehealth (HOSPITAL_COMMUNITY): Payer: Self-pay | Admitting: Surgery

## 2014-08-13 NOTE — Telephone Encounter (Signed)
Received a call from patient saying she was SOB today.  She reports that her weight is 144 or 145 lately.  Per Darrick Grinder NP-she was instructed to take an extra Lasix 40 mg today.  I have called [facility] to inform him that Darrick Grinder NP would like for him to see and evaluate the patient tomorrow and let us know how she is doing after her extra dose of Lasix.  He is agreeable and either he or another [facility] will see her tomorrow.

## 2014-08-14 ENCOUNTER — Telehealth: Payer: Self-pay | Admitting: Internal Medicine

## 2014-08-14 NOTE — Telephone Encounter (Signed)
Pt would like her lab results from 08/11/14

## 2014-08-15 NOTE — Telephone Encounter (Signed)
See result note.  This note is not needed.

## 2014-08-18 ENCOUNTER — Telehealth (HOSPITAL_COMMUNITY): Payer: Self-pay | Admitting: Vascular Surgery

## 2014-08-18 NOTE — Telephone Encounter (Signed)
Left mess on ID VM that pt ok to continue OT

## 2014-08-18 NOTE — Telephone Encounter (Signed)
[name] from [place] [facility] Occupational therapist called .Marland Kitchen Pt was suppose to be d/c today. [name] will like to cont to see the pt 1 time a week for 2 more weeks pt is having trouble  breathing and completing ADLS.Marland Kitchen Please advise

## 2014-08-25 ENCOUNTER — Telehealth: Payer: Self-pay | Admitting: Cardiology

## 2014-08-25 NOTE — Telephone Encounter (Signed)
Pt called twice within 3-5 min. Of each call with complaints of dizziness and heart flutters.  I called back and she did not answer, I called 3 times.  I then called her daughter Ms. Dorina Hoyer and explained the situation, she will go check on her.

## 2014-08-25 NOTE — Telephone Encounter (Signed)
Pt had some flutters and dizziness and was concerned, for some reason she did not answer her phone when I returned the call several times.  But she would like to be seen this week- she does not believe she can wait until November.  I will pass this on to the same day clinic.

## 2014-08-26 ENCOUNTER — Telehealth (HOSPITAL_COMMUNITY): Payer: Self-pay | Admitting: Vascular Surgery

## 2014-08-26 NOTE — Telephone Encounter (Signed)
OPEN IN ERROR 

## 2014-08-26 NOTE — Telephone Encounter (Signed)
Pt scheduled to see Richardson Dopp PA-C on 08/27/14 at 10:30.

## 2014-08-26 NOTE — Telephone Encounter (Signed)
See note below.   Can we add her on to the [facility] clinic (or my schedule if I have an opening - I have seen her before)?  Just seeing this now so tomorrow would be ok. Please check with her to see how she is doing. Richardson Dopp, PA-C   08/26/2014 5:21 PM

## 2014-08-27 ENCOUNTER — Telehealth: Payer: Self-pay | Admitting: *Deleted

## 2014-08-27 ENCOUNTER — Encounter: Payer: Self-pay | Admitting: Physician Assistant

## 2014-08-27 ENCOUNTER — Encounter: Payer: Self-pay | Admitting: Internal Medicine

## 2014-08-27 ENCOUNTER — Ambulatory Visit (INDEPENDENT_AMBULATORY_CARE_PROVIDER_SITE_OTHER): Payer: Medicare Other | Admitting: Physician Assistant

## 2014-08-27 VITALS — BP 102/70 | HR 75 | Ht <= 58 in | Wt 142.0 lb

## 2014-08-27 DIAGNOSIS — I482 Chronic atrial fibrillation, unspecified: Secondary | ICD-10-CM

## 2014-08-27 DIAGNOSIS — N183 Chronic kidney disease, stage 3 unspecified: Secondary | ICD-10-CM

## 2014-08-27 DIAGNOSIS — I5032 Chronic diastolic (congestive) heart failure: Secondary | ICD-10-CM

## 2014-08-27 DIAGNOSIS — Z23 Encounter for immunization: Secondary | ICD-10-CM

## 2014-08-27 DIAGNOSIS — I10 Essential (primary) hypertension: Secondary | ICD-10-CM

## 2014-08-27 DIAGNOSIS — F419 Anxiety disorder, unspecified: Secondary | ICD-10-CM

## 2014-08-27 DIAGNOSIS — Z950 Presence of cardiac pacemaker: Secondary | ICD-10-CM

## 2014-08-27 DIAGNOSIS — R42 Dizziness and giddiness: Secondary | ICD-10-CM

## 2014-08-27 DIAGNOSIS — I5033 Acute on chronic diastolic (congestive) heart failure: Secondary | ICD-10-CM

## 2014-08-27 DIAGNOSIS — R0602 Shortness of breath: Secondary | ICD-10-CM

## 2014-08-27 DIAGNOSIS — I4892 Unspecified atrial flutter: Secondary | ICD-10-CM

## 2014-08-27 DIAGNOSIS — R002 Palpitations: Secondary | ICD-10-CM

## 2014-08-27 LAB — BASIC METABOLIC PANEL
BUN: 32 mg/dL — AB (ref 6–23)
CO2: 29 mEq/L (ref 19–32)
Calcium: 9.6 mg/dL (ref 8.4–10.5)
Chloride: 101 mEq/L (ref 96–112)
Creatinine, Ser: 2.2 mg/dL — ABNORMAL HIGH (ref 0.4–1.2)
GFR: 22.78 mL/min — AB (ref >60.00)
Glucose, Bld: 88 mg/dL (ref 70–99)
Potassium: 3.1 mEq/L — ABNORMAL LOW (ref 3.5–5.1)
SODIUM: 139 meq/L (ref 135–145)

## 2014-08-27 LAB — BRAIN NATRIURETIC PEPTIDE: PRO B NATRI PEPTIDE: 914 pg/mL — AB (ref 0.0–100.0)

## 2014-08-27 MED ORDER — POTASSIUM CHLORIDE CRYS ER 20 MEQ PO TBCR: EXTENDED_RELEASE_TABLET | ORAL | Status: DC

## 2014-08-27 MED ORDER — FUROSEMIDE 40 MG PO TABS: ORAL_TABLET | ORAL | Status: DC

## 2014-08-27 NOTE — Patient Instructions (Addendum)
KEEP YOUR APPT 09/15/14 WITH DR. Lovena Le  Your physician recommends that you continue on your current medications as directed. Please refer to the Current Medication list given to you today.   Your physician recommends that you return for lab work in: TODAY BMET, BNP  Your physician recommends that you schedule a follow-up appointment in: [place]. Harrington Challenger

## 2014-08-27 NOTE — Telephone Encounter (Signed)
s/w pt's daughter Pamala Hurry about lab results and med changes which she read back to me x 3 with verbal understanding to med changes. BMET 10/22.

## 2014-08-27 NOTE — Progress Notes (Signed)
Cardiology Office Note   Date:  08/27/2014   ID:  [name], DOB [birth year], MRN [id number]  PCP:  [name], MD  Cardiologist:  Dr. Dorris Carnes   Electrophysiologist:  Dr. Cristopher Peru    History of Present Illness: [name] is a 80 y.o. female with a hx of nonobstructive CAD by cardiac catheterization in 01/2014, persistent atrial fibrillation, diastolic CHF, HTN, HL, CKD. Patient was admitted 04/2014 x2 with acute on chronic diastolic CHF. She underwent cardioversion with restoration of NSR. This resulted in profound bradycardia and hypotension requiring pacemaker implantation. Pacemaker implant was complicated by acute blood loss anemia requiring transfusion with PRBCs. Her Eliquis was held for several days.   In FU she was back in AF.  She was not felt to be symptomatic and rate control strategy was chosen.    Admitted x 2 in August 4098 with a/c diastolic CHF.  Most recent admission with some concerns of GI bleed. Hemoccult was negative x2. EGD demonstrated gastritis. Colonoscopy was deferred by the patient's family. She is full code.  She was treated with PPI. She is now followed in the CHF clinic.  Last seen there 08/01/14.  Lasix dose was reduced.  Of note, Amiodarone is continued for rate control.    She called in with palpitations and was added on to my schedule for evaluation.  She noted this at rest for 10-15 mins earlier this week.  She denies assoc symptoms.  She notes occ chest pain.  This is chronic.  She has it with exertion and sometimes without.  She thinks it is from anxiety.  She also notes dyspnea at times.  She gets scared that she is not going to be able to catch her breath.  She feels that she needs to take something to calm her down.  She sleeps on 2 pillows without change.  She denies PND.  She denies edema.  Weights are stable/decreased.  She is NYHA 2b.    Studies:  - LHC (3/15):  Mid LM 20-30%, mid RCA 30-40%, EF 65%  - Echo (4/15):   Mild LVH, EF 60-65%, no RWMA, mild BAE, trivial eff  - Carotid US (5/15):  40-59% RICA stenosis. 1-19% LICA stenosis.   Recent Labs: 04/14/2014: HDL Cholesterol by NMR 89; LDL (calc) 41  07/04/2014: ALT 10; TSH 2.760  07/18/2014: Pro B Natriuretic peptide (BNP) 2626.0*  08/11/2014: Creatinine 1.7*; Hemoglobin 11.6*; Potassium 4.6   Wt Readings from Last 3 Encounters:  08/27/14 142 lb (64.411 kg)  08/11/14 147 lb 1.6 oz (66.724 kg)  08/01/14 145 lb (65.772 kg)     Past Medical History  Diagnosis Date  . Dyslipidemia   . Hypertension   . Palpitations     PVCs/bigeminy on event in May 2009 revealing this was relatively asymptomatic  . Chest pain     a. 2011 Neg MV;  b. 01/2014 Cath: LM 20-30, LAD nl, D1 nl, LCX nl, OM1 nl, RCA dom 30-60m, PD/PL nl, EF 65%->Med Rx.  . Degenerative joint disease   . Hx of varicella   . History of skin cancer     tafeen/ non melanoma.   . Thrombocytopenia   . Anemia   . Monoclonal gammopathies   . Right lower quadrant abdominal pain 07/19/2012    Recurrent  With nausea    This is new since she had ct for llq pain in [facility]    R/o appendiceal problem  Hernia  Get ct scan  And  plan  Fu     . Diastolic CHF, acute on chronic 01/31/2012  . Atrial fibrillation     a. Dx 01/2014->Eliquis started.  . Cancer   . Pacemaker   . Dysrhythmia     Current Outpatient Prescriptions  Medication Sig Dispense Refill  . amiodarone (PACERONE) 200 MG tablet Take 1 tablet (200 mg total) by mouth daily.  30 tablet  6  . apixaban (ELIQUIS) 2.5 MG TABS tablet Take 1 tablet (2.5 mg total) by mouth 2 (two) times daily.      Marland Kitchen CALCIUM PO Take 600 mg by mouth daily.       . ferrous sulfate 325 (65 FE) MG tablet Take 1 tablet (325 mg total) by mouth 2 (two) times daily with a meal.  60 tablet  1  . furosemide (LASIX) 40 MG tablet Take 1 tablet (40 mg total) by mouth daily. Take an extra 40 mg of lasix if your weight is 150 pounds or greater.  45 tablet  6  . levothyroxine  (SYNTHROID, LEVOTHROID) 75 MCG tablet Take 1 tablet (75 mcg total) by mouth daily.  90 tablet  2  . loratadine (CLARITIN) 10 MG tablet Take 10 mg by mouth daily as needed for allergies.       . metoprolol tartrate (LOPRESSOR) 25 MG tablet Take 1 tablet (25 mg total) by mouth 2 (two) times daily.  30 tablet  0  . nitroGLYCERIN (NITROSTAT) 0.4 MG SL tablet Place 1 tablet (0.4 mg total) under the tongue every 5 (five) minutes as needed. For chest pain  25 tablet  6  . pantoprazole (PROTONIX) 40 MG tablet Take 1 tablet (40 mg total) by mouth daily.  30 tablet  5  . potassium chloride SA (KLOR-CON M20) 20 MEQ tablet Take 1 tablet (20 mEq total) by mouth daily.  90 tablet  3  . senna (SENOKOT) 8.6 MG TABS tablet Take 2 tablets (17.2 mg total) by mouth daily as needed for mild constipation.  20 each  0  . simvastatin (ZOCOR) 10 MG tablet Take 1 tablet (10 mg total) by mouth at bedtime.       No current facility-administered medications for this visit.    Allergies:   Tramadol   Social History:  The patient  reports that she has never smoked. She has never used smokeless tobacco. She reports that she does not drink alcohol or use illicit drugs.   Family History:  The patient's family history includes Alzheimer's disease in her mother; Cancer (age of onset: 65) in her brother; Coronary artery disease in her father; Heart attack in her father; Heart disease in her father; Lung cancer in an other family member.   ROS:  Please see the history of present illness.   She has noted recent tremors in her right hand.   She has a non-productive cough.  She notes insomnia.  She has a poor appetite.  All other systems reviewed and negative.   PHYSICAL EXAM: VS:  BP 102/70  Pulse 75  Ht 4\' 10"  (1.473 m)  Wt 142 lb (64.411 kg)  BMI 29.69 kg/m2 Well nourished, well developed, in no acute distress HEENT: normal Neck: no JVD Cardiac:  normal S1, S2; irregularly irregular rhythm; no murmur Lungs:  Decreased  breath sounds bilaterally, no wheezing, rhonchi or rales Abd: soft, nontender, no hepatomegaly Ext: no edema Skin: warm and dry Neuro:  CNs 2-12 intact, no focal abnormalities noted  EKG:  Atrial fibrillation, HR 75, V paced  ASSESSMENT AND PLAN:  1. Chronic atrial fibrillation:  She is rate controlled.  She remains on Eliquis and is tolerating this.  She remains on Amiodarone.  Surveillance Labs:  07/04/2014: ALT 10; AST 14; TSH 2.760.  Her device was interrogated.  No high HR or ventricular arrhythmias were noted.    2. Chronic diastolic heart failure:  Volume appears stable.  I suspect her anxiety is contributing to her dyspnea.  I will check a BMET and BNP.  Adjust Lasix if necessary.    3. Essential hypertension, benign:  Controlled.  4. Coronary artery disease:  She has some atypical chest pain.  She had mild non-obs CAD at her cath earlier this year.  No further ischemic workup is needed.   Continue statin.  She is not on ASA as she is on Eliquis.   5. Chronic renal disease, stage III:  Recent creatinine stable.  08/27/2014: Creatinine 2.2*   6. HYPERLIPIDEMIA-MIXED:   Continue statin.  7. Pacemaker:  FU with EP as planned.  8. Anxiety:  She would probably benefit from treating her anxiety.  Her PCP asked her to review with cardiology to see what medications would be ok with her cardiac medications.  I reviewed with o ur PharmD Ronnald Collum).  Ativan would be a good choice with the least amount of interaction with Amiodarone.  She could use this at a very low dose prn.  As far as the SSRIs go, Lexapro is the least likely to cause QT prolongation.  She should FU with her PCP to decide on Rx.     Disposition:   FU Dr. Cristopher Peru as planned and Dr. Dorris Carnes in 3 mos.  Flu shot today.  Signed, Versie Starks, MHS 08/27/2014 10:49 AM    [facility] [address], [place], [place]  [address] Phone: [phone]; Fax: [phone]

## 2014-08-28 ENCOUNTER — Telehealth: Payer: Self-pay | Admitting: Physician Assistant

## 2014-08-28 NOTE — Telephone Encounter (Signed)
New message      Pt was seen yesterday.  She want her lab results. Also, pt got a flu shot yesterday.  Today she is nausea, diarrhea and feels warm.  Could this be a reaction to the flu shot?

## 2014-08-28 NOTE — Telephone Encounter (Signed)
pt notified about lab results and med changes with verbal understanding. Advised pt about he sxm after flu shot yesterday, to hydrate, take tylenol, contact PCP if not better by tomorrow. Pt agreeable to Plan of Care.

## 2014-08-29 ENCOUNTER — Telehealth: Payer: Self-pay | Admitting: Internal Medicine

## 2014-08-29 NOTE — Telephone Encounter (Signed)
Patient is calling in states that she has difficulty sleeping at night and followed up with her cardiologist to see which medications would be appropriate for her to take to assist with her sleep and would not have an impact from a cardiology standpoint.  Please see note entered in from [place] in regards to recommendation for Ativan or Lexapro with PCP follow up for prescribing.   Office note:  Please follow up with patient in regards to note from Cardiologist and request for medication script to be submitted to pharmacy.

## 2014-08-31 ENCOUNTER — Other Ambulatory Visit (HOSPITAL_COMMUNITY): Payer: Self-pay | Admitting: Internal Medicine

## 2014-09-01 ENCOUNTER — Telehealth: Payer: Self-pay | Admitting: Internal Medicine

## 2014-09-01 NOTE — Telephone Encounter (Signed)
LM for Jim to return my call. 

## 2014-09-01 NOTE — Telephone Encounter (Signed)
New message    Home health calling     Need verbal order for twice a week for 2 more weeks.    Patient miss multiple visit due to sickness.

## 2014-09-01 NOTE — Telephone Encounter (Signed)
Ok to extend PT

## 2014-09-01 NOTE — Telephone Encounter (Signed)
Will forward to Dr. Ross.  

## 2014-09-01 NOTE — Telephone Encounter (Signed)
[name] would like to extend pt visits for 2 weeks, 1x per week.  He states pt isn't feeling any better.

## 2014-09-02 ENCOUNTER — Telehealth: Payer: Self-pay | Admitting: Internal Medicine

## 2014-09-02 NOTE — Telephone Encounter (Signed)
OK to approve for 2 more weeks

## 2014-09-02 NOTE — Telephone Encounter (Signed)
Pt wants to know who will be the one to rx her med and what will not interfere w/ her other meds? Kathlen Mody or Dr Regis Bill?   Pt states weaver told her Dr Regis Bill.  Pt states she is apprehensive at night b/c she is afraid she will not be able to breathe. Pt states she knows this is silly b/c she has O2 bedside, but she still worries. [facility]

## 2014-09-02 NOTE — Telephone Encounter (Signed)
Seems this was given in the hospital.  Will for ward to [facility] to see if she is taking over filling this medication.

## 2014-09-02 NOTE — Telephone Encounter (Signed)
Reviewed record .  Cardiology  Felt that her sx are from anxiety .  And they advised  PCP give medications  Some suggested .  Would need Ov to discuss/ prescribe  these med types. Move up her appt from November   To be seen   In the next 1-2 weeks  ( 30 minutes) .

## 2014-09-02 NOTE — Telephone Encounter (Signed)
[facility] [place], [place] - [address] Is requesting re-fill on ferrous sulfate 325 (65 FE) MG tablet

## 2014-09-02 NOTE — Telephone Encounter (Signed)
Debbie given verbal order for PT for 2 more weeks per Dr Harrington Challenger.

## 2014-09-04 ENCOUNTER — Telehealth: Payer: Self-pay | Admitting: *Deleted

## 2014-09-04 ENCOUNTER — Ambulatory Visit: Payer: Medicare Other | Admitting: Internal Medicine

## 2014-09-04 ENCOUNTER — Other Ambulatory Visit (INDEPENDENT_AMBULATORY_CARE_PROVIDER_SITE_OTHER): Payer: Medicare Other | Admitting: *Deleted

## 2014-09-04 ENCOUNTER — Other Ambulatory Visit: Payer: Self-pay | Admitting: *Deleted

## 2014-09-04 DIAGNOSIS — I5033 Acute on chronic diastolic (congestive) heart failure: Secondary | ICD-10-CM

## 2014-09-04 DIAGNOSIS — I5032 Chronic diastolic (congestive) heart failure: Secondary | ICD-10-CM

## 2014-09-04 LAB — BASIC METABOLIC PANEL
BUN: 32 mg/dL — ABNORMAL HIGH (ref 6–23)
CO2: 23 mEq/L (ref 19–32)
CREATININE: 2 mg/dL — AB (ref 0.4–1.2)
Calcium: 9.6 mg/dL (ref 8.4–10.5)
Chloride: 105 mEq/L (ref 96–112)
GFR: 24.46 mL/min — AB (ref >60.00)
Glucose, Bld: 98 mg/dL (ref 70–99)
Potassium: 3.1 mEq/L — ABNORMAL LOW (ref 3.5–5.1)
Sodium: 142 mEq/L (ref 135–145)

## 2014-09-04 MED ORDER — POTASSIUM CHLORIDE CRYS ER 20 MEQ PO TBCR: EXTENDED_RELEASE_TABLET | ORAL | Status: DC

## 2014-09-04 NOTE — Telephone Encounter (Signed)
s/w pt about her lab results and dose changew/K+ from 20/40 alternating days to 40 meq bid, will get bmet when she sees Dr. Lovena Le. Pt asked for me to s/w her daughters because she was not sure she would remember.

## 2014-09-04 NOTE — Telephone Encounter (Signed)
Spoke to [place] and gave permission to extent OT (not PT).

## 2014-09-05 ENCOUNTER — Ambulatory Visit: Payer: Medicare Other | Admitting: Internal Medicine

## 2014-09-05 ENCOUNTER — Other Ambulatory Visit (HOSPITAL_COMMUNITY): Payer: Self-pay | Admitting: Internal Medicine

## 2014-09-05 ENCOUNTER — Telehealth: Payer: Self-pay | Admitting: Internal Medicine

## 2014-09-05 NOTE — Telephone Encounter (Signed)
New message  Pt daughter returned call to discuss medication change from [facility]//sr

## 2014-09-06 ENCOUNTER — Ambulatory Visit (HOSPITAL_COMMUNITY)
Admission: RE | Admit: 2014-09-06 | Discharge: 2014-09-06 | Disposition: A | Discharge status: Home / Self Care | Payer: Medicare Other | Source: Ambulatory Visit | Attending: Family Medicine | Admitting: Family Medicine

## 2014-09-06 ENCOUNTER — Telehealth: Payer: Self-pay | Admitting: General Practice

## 2014-09-06 ENCOUNTER — Encounter: Payer: Self-pay | Admitting: Family Medicine

## 2014-09-06 ENCOUNTER — Ambulatory Visit (INDEPENDENT_AMBULATORY_CARE_PROVIDER_SITE_OTHER): Payer: Medicare Other | Admitting: Family Medicine

## 2014-09-06 VITALS — BP 132/90 | HR 83 | Temp 97.9°F | Resp 16 | Wt 143.2 lb

## 2014-09-06 DIAGNOSIS — R0781 Pleurodynia: Secondary | ICD-10-CM | POA: Diagnosis not present

## 2014-09-06 DIAGNOSIS — R0789 Other chest pain: Secondary | ICD-10-CM | POA: Diagnosis not present

## 2014-09-06 DIAGNOSIS — E876 Hypokalemia: Secondary | ICD-10-CM

## 2014-09-06 DIAGNOSIS — R197 Diarrhea, unspecified: Secondary | ICD-10-CM | POA: Insufficient documentation

## 2014-09-06 LAB — BASIC METABOLIC PANEL
ANION GAP: 13 (ref 5–15)
BUN: 32 mg/dL — ABNORMAL HIGH (ref 6–23)
CHLORIDE: 102 meq/L (ref 96–112)
CO2: 28 meq/L (ref 19–32)
Calcium: 9.9 mg/dL (ref 8.4–10.5)
Creatinine, Ser: 1.93 mg/dL — ABNORMAL HIGH (ref 0.50–1.10)
GFR calc non Af Amer: 22 mL/min — ABNORMAL LOW (ref >90)
GFR, EST AFRICAN AMERICAN: 26 mL/min — AB (ref >90)
Glucose, Bld: 110 mg/dL — ABNORMAL HIGH (ref 70–99)
POTASSIUM: 4 meq/L (ref 3.7–5.3)
SODIUM: 143 meq/L (ref 137–147)

## 2014-09-06 LAB — CBC WITH DIFFERENTIAL/PLATELET
BASOS PCT: 1 % (ref 0–1)
Basophils Absolute: 0 10*3/uL (ref 0.0–0.1)
EOS PCT: 2 % (ref 0–5)
Eosinophils Absolute: 0.1 10*3/uL (ref 0.0–0.7)
HEMATOCRIT: 42.4 % (ref 36.0–46.0)
HEMOGLOBIN: 13.2 g/dL (ref 12.0–15.0)
LYMPHS PCT: 20 % (ref 12–46)
Lymphs Abs: 0.9 10*3/uL (ref 0.7–4.0)
MCH: 27.5 pg (ref 26.0–34.0)
MCHC: 31.1 g/dL (ref 30.0–36.0)
MCV: 88.3 fL (ref 78.0–100.0)
MONOS PCT: 10 % (ref 3–12)
Monocytes Absolute: 0.5 10*3/uL (ref 0.1–1.0)
NEUTROS ABS: 3 10*3/uL (ref 1.7–7.7)
NEUTROS PCT: 67 % (ref 43–77)
Platelets: 118 10*3/uL — ABNORMAL LOW (ref 150–400)
RBC: 4.8 MIL/uL (ref 3.87–5.11)
RDW: 22.9 % — ABNORMAL HIGH (ref 11.5–15.5)
WBC: 4.5 10*3/uL (ref 4.0–10.5)

## 2014-09-06 LAB — TROPONIN I: Troponin I: <0.3 ng/mL (ref <0.30)

## 2014-09-06 NOTE — Assessment & Plan Note (Signed)
Pt reports sxs are improving and stools are now loose rather than water.  Stools are dark but pt is on iron supplement due to known gastritis.  On PPI.  Pt declined rectal exam today so will repeat blood count.  Pt has f/u scheduled w/ PCP on Tuesday.

## 2014-09-06 NOTE — Progress Notes (Signed)
   Subjective:    Patient ID: [name], female    DOB: [birth year], 80 y.o.   MRN: [id number]  HPI Chest pain- pt reports she developed sharp pains this AM.  1st on the L side and then to the R side.  Pain was sharp, fleeting.  Hx of pacemaker on R side.  Hx of Afib- on Eliquis.  Hx of diastolic CHF, MI.  Pt had similar sxs 6 weeks ago but 'not as strong'.  Those sxs resolved spontaneously.  Pt has not had a repeat pain since calling this AM.  No SOB, + nausea.  No dizziness.  Nausea resolved.  'i forgot to tell you, those ribs (L lower) have been hurting for a week now'.  + burping w/ relief of pain.  Pt is completely asymptomatic at this time.  Diarrhea- started 10/22 after the flu shot.  Pt thought sxs were improving but returned this AM.  Stools were loose and not watery.  Stools were dark but pt is on iron.  Recent blood count shows levels had improved from 10.7 --> 11.6.  Pt w/ known gastritis.  On Protonix.  Still drinking prune juice regularly.  No fevers.   Review of Systems For ROS see HPI     Objective:   Physical Exam  Vitals reviewed. Constitutional: She is oriented to person, place, and time. She appears well-developed and well-nourished. No distress.  HENT:  Head: Normocephalic and atraumatic.  Cardiovascular: Normal rate, normal heart sounds and intact distal pulses.   Irregularly irregular S1/S2  Pulmonary/Chest: Effort normal and breath sounds normal. No respiratory distress. She has no wheezes. She has no rales. She exhibits tenderness (TTP over L lower ribs w/o obvious bony abnormality).  Abdominal: Soft. Bowel sounds are normal. She exhibits no distension. There is no tenderness.  Musculoskeletal: She exhibits no edema.  Neurological: She is alert and oriented to person, place, and time.  Skin: Skin is warm and dry.  Psychiatric: She has a normal mood and affect. Her behavior is normal.          Assessment & Plan:

## 2014-09-06 NOTE — Assessment & Plan Note (Signed)
Noted on previous labs.  Due to ongoing loose stools will repeat today.

## 2014-09-06 NOTE — Assessment & Plan Note (Signed)
New to provider.  Pt's sxs are very atypical for cardiac chest pain- sharp, migratory, fleeting, relieved w/ belching- and are more consistent w/ gas or GI etiology.  However, pt does have long hx of cardiac issues and multiple medical co-morbidities along w/ T wave inversions in all leads w/ the exception of 2 on EKG today (when compared to April).  Pt is currently asymptomatic, in no distress and well appearing.  Rather than send to ER which she adamantly does not want, will get stat troponin to assess for cardiac injury.  Reviewed current findings and plan w/ both pt and daughter and they are in agreement and will proceed to ER if sxs change or labs are abnormal.

## 2014-09-06 NOTE — Progress Notes (Signed)
Pre visit review using our clinic review tool, if applicable. No additional management support is needed unless otherwise documented below in the visit note. 

## 2014-09-06 NOTE — Assessment & Plan Note (Signed)
New.  Pt w/ TTP over L lower ribs w/o obvious cause.  No bony abnormality palpated but will get xray to assess.

## 2014-09-06 NOTE — Telephone Encounter (Signed)
Patient is on Saturday Clinic schedule (no triage info). Called pt who advised that she had been having sharp, chest pain at least twice in the last hour. Pt states that is is deep in the middle of Left breast. Pt denies SOB, dizziness, states that it only started today. Pt declined ER visit. Pt also stated that she could not come in before 12:45 due to transportation.    Pt daughter was called and notified that mom was having sharp chest pain and preferred treatment would be to see ER since we do not have any capabilities to complete lab in the office today and we could only complete EKG. Pt daughter stated that if mom looked pale, SOB, or chest pain complaints on arrival she would drive her to ER to be evaluated.

## 2014-09-06 NOTE — Telephone Encounter (Signed)
Agree w/ advice given.  Will see pt in office since she declined ER (which is preferred site of evaluation) but given multiple medical co morbidities and cardiac history, will have low threshold to send to ER for evaluation

## 2014-09-06 NOTE — Patient Instructions (Signed)
Follow up w/ Dr Regis Bill as scheduled If you have additional chest pain, any shortness of breath, or other concerns- you MUST go to the ER If your lab results are abnormal, I will call you and send you to the ER We'll notify you of your lab results and chest xray The loose stools sound like they are improving The chest pains are not consistent w/ heart pain but we have to rule it out Call with any questions or concerns Hang in there!!

## 2014-09-08 ENCOUNTER — Telehealth: Payer: Self-pay | Admitting: Internal Medicine

## 2014-09-08 NOTE — Telephone Encounter (Signed)
New message     Pt says her heart is racing.  It started within the hour.  Please advise

## 2014-09-08 NOTE — Telephone Encounter (Signed)
Ms. Bogdanski calling stating about 1 hr ago she felt her heart "racing" and pounding.  States she felt like it was hard to get her breath. She had a CV in June. States she has not been anxious or under stress the past few days. States she has taken all of her medications today.  Her daughter puts them in a pill container. States her HR via O2 meter is reading 100-106.  She has not taken BP. Spoke w/Danya Dunn,PA/flex who advises for her to take her PM dose of Metoprolol now and in 1 hr take the Ativan then if rate not decreased  to go to ER.  Pt will call her daughter and have her daughter call us for instructions.  She verbally gave permission for me to talk with daughter regarding her care.   Daughter [name]) called. Gave her instructions to give the Metoprolol 25 mg now, and if rate hasn't decrease in 1 hr to give the Ativan. States she does not have the Rx for Ativan because she hasn't seen Dr. Regis Bill yet.  Scheduled to see her tomorrow.  Advised daughter that if rate does not decrease and Ms. Son is still anxious then would need to take her to ER.  Also verified dose of KCL.  States she was unable to talk with Julaine Hua when she called with results.  Advised that she was to take KCL 40 meq BID until 11/2 when has lab repeated.  She states she has not been taking but 40 meq at HS.  She will change dose. She verbalizes understanding on instructions.

## 2014-09-09 ENCOUNTER — Ambulatory Visit (INDEPENDENT_AMBULATORY_CARE_PROVIDER_SITE_OTHER): Payer: Medicare Other | Admitting: Internal Medicine

## 2014-09-09 VITALS — BP 92/70 | Temp 97.3°F | Wt 145.6 lb

## 2014-09-09 DIAGNOSIS — R031 Nonspecific low blood-pressure reading: Secondary | ICD-10-CM

## 2014-09-09 DIAGNOSIS — I482 Chronic atrial fibrillation, unspecified: Secondary | ICD-10-CM

## 2014-09-09 DIAGNOSIS — G8929 Other chronic pain: Secondary | ICD-10-CM

## 2014-09-09 DIAGNOSIS — I5032 Chronic diastolic (congestive) heart failure: Secondary | ICD-10-CM

## 2014-09-09 DIAGNOSIS — R079 Chest pain, unspecified: Secondary | ICD-10-CM

## 2014-09-09 DIAGNOSIS — D509 Iron deficiency anemia, unspecified: Secondary | ICD-10-CM

## 2014-09-09 DIAGNOSIS — Z7901 Long term (current) use of anticoagulants: Secondary | ICD-10-CM

## 2014-09-09 DIAGNOSIS — R0602 Shortness of breath: Secondary | ICD-10-CM

## 2014-09-09 DIAGNOSIS — F411 Generalized anxiety disorder: Secondary | ICD-10-CM

## 2014-09-09 MED ORDER — FERROUS SULFATE 325 (65 FE) MG PO TABS: 325.0000 mg | ORAL_TABLET | Freq: Two times a day (BID) | ORAL | Status: DC

## 2014-09-09 MED ORDER — LORAZEPAM 0.5 MG PO TABS: 0.2500 mg | ORAL_TABLET | Freq: Three times a day (TID) | ORAL | Status: DC | PRN

## 2014-09-09 MED ORDER — LORAZEPAM 0.5 MG PO TABS: 0.5000 mg | ORAL_TABLET | Freq: Three times a day (TID) | ORAL | Status: DC | PRN

## 2014-09-09 NOTE — Patient Instructions (Addendum)
I agree anxiety could be adding on to the situation.  Can try  Lorazepam low dose as needed. Including at night  when anxiety is escalating .  Take med first time with  Support person around untilk you know how if works with you.   Will refill the iron for now .    Stay on  another month and then stop.    Blood count was normal   This week .  Plan repeat  cbcdiff and IBC in 2 months  Sign up for my chart to be able to see appt times etc.

## 2014-09-09 NOTE — Progress Notes (Signed)
Pre visit review using our clinic review tool, if applicable. No additional management support is needed unless otherwise documented below in the visit note.  Chief Complaint  Patient presents with  . Follow-up    cards and at clinic    HPI: [name] is 80 y.o.   sent back to [place] by cardiology because of concern about anxiety causing some of her symptoms. And advise about anxiety sleep Recommended could try  Lexapro and atiivan .  She has had recurrent heart failure pacemaker for atrial fib sinus bradycardia on chronic anticoagulation Her living arrangements lives byself family visits every day and arranges and puts her meds out . No plans  To change this situation.    She also  was seen on the Saturday clinic for twinge left sided  Sharp chest pain 3 days ago  After declining ed  Evaluation....troponin was negative as well as chest x-ray felt to be rib pain had some diarrhea some formed  And loose  and mild hypokalemia. Below is the advice assessment from [facility] note: Anxiety: She would probably benefit from treating her anxiety. Her PCP asked her to review with cardiology to see what medications would be ok with her cardiac medications. I reviewed with o ur PharmD Ronnald Collum). Ativan would be a good choice with the least amount of interaction with Amiodarone. She could use this at a very low dose prn. As far as the SSRIs go, Lexapro is the least likely to cause QT prolongation. She should FU with her PCP to decide on Rx.     ROS: See pertinent positives and negatives per HPI. No current cp sob concern about not getting back to her energy level and has doe .  Cannot vacuum as she could before . Gets anxious sometimes at various times  Ex taking temp today other .  She weight every day . Seen in chf clinic. ? If memory changing no syncope  recenet falling  Past Medical History  Diagnosis Date  . Dyslipidemia   . Hypertension   . Palpitations     PVCs/bigeminy on event in  May 2009 revealing this was relatively asymptomatic  . Chest pain     a. 2011 Neg MV;  b. 01/2014 Cath: LM 20-30, LAD nl, D1 nl, LCX nl, OM1 nl, RCA dom 30-13m, PD/PL nl, EF 65%->Med Rx.  . Degenerative joint disease   . Hx of varicella   . History of skin cancer     tafeen/ non melanoma.   . Thrombocytopenia   . Anemia   . Monoclonal gammopathies   . Right lower quadrant abdominal pain 07/19/2012    Recurrent  With nausea    This is new since she had ct for llq pain in [facility]    R/o appendiceal problem  Hernia  Get ct scan  And plan  Fu     . Diastolic CHF, acute on chronic 01/31/2012  . Atrial fibrillation     a. Dx 01/2014->Eliquis started.  . Cancer   . Pacemaker   . Dysrhythmia     Family History  Problem Relation Age of Onset  . Coronary artery disease Father   . Heart disease Father   . Lung cancer    . Alzheimer's disease Mother   . Cancer Brother 98    lung cancer  . Heart attack Father     History   Social History  . Marital Status: Widowed    Spouse Name: N/A  Number of Children: 3  . Years of Education: N/A   Occupational History  .      [employer], book keeping   Social History Main Topics  . Smoking status: Never Smoker   . Smokeless tobacco: Never Used  . Alcohol Use: No  . Drug Use: No  . Sexual Activity: Not Currently   Other Topics Concern  . 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 Eats with her.    Outpatient Encounter Prescriptions as of 09/09/2014  Medication Sig  . amiodarone (PACERONE) 200 MG tablet Take 1 tablet (200 mg total) by mouth daily.  Marland Kitchen apixaban (ELIQUIS) 2.5 MG TABS tablet Take 1 tablet (2.5 mg total) by mouth 2 (two) times daily.  Marland Kitchen CALCIUM PO Take 600 mg by mouth daily.   . ferrous  sulfate 325 (65 FE) MG tablet Take 1 tablet (325 mg total) by mouth 2 (two) times daily with a meal.  . furosemide (LASIX) 40 MG tablet Alternate with 20 mg every other day with 40 mg every other day  . levothyroxine (SYNTHROID, LEVOTHROID) 75 MCG tablet Take 1 tablet (75 mcg total) by mouth daily.  Marland Kitchen loratadine (CLARITIN) 10 MG tablet Take 10 mg by mouth daily as needed for allergies.   . metoprolol tartrate (LOPRESSOR) 25 MG tablet Take 1 tablet (25 mg total) by mouth 2 (two) times daily.  . nitroGLYCERIN (NITROSTAT) 0.4 MG SL tablet Place 1 tablet (0.4 mg total) under the tongue every 5 (five) minutes as needed. For chest pain  . pantoprazole (PROTONIX) 40 MG tablet Take 1 tablet (40 mg total) by mouth daily.  . potassium chloride SA (KLOR-CON M20) 20 MEQ tablet 40 meq twice daily  . senna (SENOKOT) 8.6 MG TABS tablet Take 2 tablets (17.2 mg total) by mouth daily as needed for mild constipation.  . simvastatin (ZOCOR) 10 MG tablet Take 1 tablet (10 mg total) by mouth at bedtime.  . [DISCONTINUED] ferrous sulfate 325 (65 FE) MG tablet Take 1 tablet (325 mg total) by mouth 2 (two) times daily with a meal.  . LORazepam (ATIVAN) 0.5 MG tablet Take 0.5-1 tablets (0.25-0.5 mg total) by mouth every 8 (eight) hours as needed for anxiety.  . [DISCONTINUED] LORazepam (ATIVAN) 0.5 MG tablet Take 1-2 tablets (0.5-1 mg total) by mouth every 8 (eight) hours as needed for anxiety.    EXAM:  BP 92/70 mmHg  Temp(Src) 97.3 F (36.3 C) (Oral)  Wt 145 lb 9.6 oz (66.044 kg)  Body mass index is 30.44 kg/(m^2).  GENERAL: vitals reviewed and listed above, alert, oriented,to time person place  appears well hydrated and in no acute distress a bit diffuse nl speech formal mental status not done  HEENT: atraumatic, conjunctiva  clear, no obvious abnormalities on inspection of external nose and ears   NECK: no obvious masses on inspection palpation  LUNGS: clear to auscultation bilaterally, no wheezes, rales or  rhonchi, good air movement kyphosis  CV: HR 70 range , no clubbing cyanosis slight   peripheral edema nl cap refill  MS: moves all extremities  PSYCH: cooperative, no acute anxiety today  No active bleeding  Gait independent with cane  Lab Results  Component Value Date   WBC 4.5 09/06/2014   HGB 13.2 09/06/2014   HCT 42.4 09/06/2014   PLT 118* 09/06/2014   GLUCOSE 110* 09/06/2014   CHOL 141 04/14/2014   TRIG 56 04/14/2014   HDL 89 04/14/2014   LDLCALC 41 04/14/2014   ALT 10 07/04/2014   AST 14 07/04/2014   NA 143 09/06/2014   K 4.0 09/06/2014   CL 102 09/06/2014   CREATININE 1.93* 09/06/2014   BUN 32* 09/06/2014   CO2 28 09/06/2014   TSH 2.760 07/04/2014   INR 1.53* 07/04/2014    ASSESSMENT AND PLAN:  Discussed the following assessment and plan:  Anxiety state - no formal testing  but some memory issue may be at play extreme caution with meds  to be given only with attendance  first time and then only few avail at a tim  Chronic diastolic heart failure  Chronic atrial fibrillation  Low blood pressure reading - check from hh  and report to cardiology about med change as approprorate  not from anemia ( last lab [facility])  Chronic anticoagulation  Iron deficiency anemia - resolved  ok to stay on iron another month then off and plan fu.   Shortness of breath Check bp reasdings  3 x  Contact cards if low  Pulse is good today   Fu about iron plan  Risk benefit of medication discussed. And extreme caution advised . Uncertain if family has realistic  Care expectations.  [name] said  She herself felt anxious going out of hospital when addressing  dnr etc. Will need to address further in   Poss causes making anxiety worse. -Patient advised to return or notify health care team  if symptoms worsen ,persist or new concerns arise.  Patient Instructions  I agree anxiety could be adding on to the situation.  Can try  Lorazepam low dose as needed. Including at night  when anxiety is  escalating .  Take med first time with  Support person around untilk you know how if works with you.   Will refill the iron for now .    Stay on  another month and then stop.    Blood count was normal   This week .  Plan repeat  cbcdiff and IBC in 2 months  Sign up for my chart to be able to see appt times etc.      Standley Brooking. Panosh M.D. Total visit 56mins > 50% spent counseling and coordinating care

## 2014-09-14 ENCOUNTER — Encounter: Payer: Self-pay | Admitting: Internal Medicine

## 2014-09-14 DIAGNOSIS — F411 Generalized anxiety disorder: Secondary | ICD-10-CM | POA: Insufficient documentation

## 2014-09-14 DIAGNOSIS — R079 Chest pain, unspecified: Secondary | ICD-10-CM | POA: Insufficient documentation

## 2014-09-15 ENCOUNTER — Telehealth (HOSPITAL_COMMUNITY): Payer: Self-pay | Admitting: *Deleted

## 2014-09-15 ENCOUNTER — Ambulatory Visit (INDEPENDENT_AMBULATORY_CARE_PROVIDER_SITE_OTHER): Payer: Medicare Other | Admitting: Internal Medicine

## 2014-09-15 ENCOUNTER — Telehealth: Payer: Self-pay

## 2014-09-15 ENCOUNTER — Encounter: Payer: Self-pay | Admitting: Internal Medicine

## 2014-09-15 ENCOUNTER — Ambulatory Visit (HOSPITAL_COMMUNITY)
Admission: RE | Admit: 2014-09-15 | Discharge: 2014-09-15 | Disposition: A | Discharge status: Home / Self Care | Payer: Medicare Other | Source: Ambulatory Visit | Attending: Internal Medicine | Admitting: Internal Medicine

## 2014-09-15 ENCOUNTER — Encounter (HOSPITAL_COMMUNITY): Payer: Self-pay

## 2014-09-15 ENCOUNTER — Other Ambulatory Visit (INDEPENDENT_AMBULATORY_CARE_PROVIDER_SITE_OTHER): Payer: Medicare Other | Admitting: *Deleted

## 2014-09-15 VITALS — BP 90/58 | HR 67 | Resp 18 | Wt 147.5 lb

## 2014-09-15 VITALS — BP 90/52 | HR 67 | Ht 58.5 in | Wt 148.0 lb

## 2014-09-15 DIAGNOSIS — I5032 Chronic diastolic (congestive) heart failure: Secondary | ICD-10-CM | POA: Diagnosis present

## 2014-09-15 DIAGNOSIS — N183 Chronic kidney disease, stage 3 unspecified: Secondary | ICD-10-CM

## 2014-09-15 DIAGNOSIS — I482 Chronic atrial fibrillation, unspecified: Secondary | ICD-10-CM

## 2014-09-15 DIAGNOSIS — I10 Essential (primary) hypertension: Secondary | ICD-10-CM | POA: Insufficient documentation

## 2014-09-15 DIAGNOSIS — F419 Anxiety disorder, unspecified: Secondary | ICD-10-CM | POA: Insufficient documentation

## 2014-09-15 DIAGNOSIS — I480 Paroxysmal atrial fibrillation: Secondary | ICD-10-CM

## 2014-09-15 DIAGNOSIS — Z950 Presence of cardiac pacemaker: Secondary | ICD-10-CM

## 2014-09-15 LAB — MDC_IDC_ENUM_SESS_TYPE_INCLINIC
Battery Voltage: 3.02 V
Implantable Pulse Generator Model: 2240
Implantable Pulse Generator Serial Number: 7627922
Lead Channel Pacing Threshold Amplitude: 1.25 V
Lead Channel Pacing Threshold Amplitude: 1.25 V
Lead Channel Pacing Threshold Pulse Width: 0.4 ms
Lead Channel Sensing Intrinsic Amplitude: 12 mV
Lead Channel Sensing Intrinsic Amplitude: 4.9 mV
Lead Channel Setting Pacing Amplitude: 2 V
Lead Channel Setting Pacing Amplitude: 2.5 V
Lead Channel Setting Pacing Pulse Width: 0.4 ms
MDC IDC MSMT BATTERY REMAINING LONGEVITY: 118.8 mo
MDC IDC MSMT LEADCHNL RA IMPEDANCE VALUE: 450 Ohm
MDC IDC MSMT LEADCHNL RA PACING THRESHOLD AMPLITUDE: 0.75 V
MDC IDC MSMT LEADCHNL RA PACING THRESHOLD PULSEWIDTH: 0.4 ms
MDC IDC MSMT LEADCHNL RV IMPEDANCE VALUE: 537.5 Ohm
MDC IDC MSMT LEADCHNL RV PACING THRESHOLD PULSEWIDTH: 0.4 ms
MDC IDC SESS DTM: 20151102111050
MDC IDC SET LEADCHNL RV SENSING SENSITIVITY: 2 mV
MDC IDC STAT BRADY RA PERCENT PACED: 13 %
MDC IDC STAT BRADY RV PERCENT PACED: 53 %

## 2014-09-15 LAB — BASIC METABOLIC PANEL
BUN: 52 mg/dL — ABNORMAL HIGH (ref 6–23)
CALCIUM: 10.3 mg/dL (ref 8.4–10.5)
CO2: 23 meq/L (ref 19–32)
Chloride: 102 mEq/L (ref 96–112)
Creatinine, Ser: 3.9 mg/dL — ABNORMAL HIGH (ref 0.4–1.2)
GFR: 11.72 mL/min — CL (ref >60.00)
Glucose, Bld: 95 mg/dL (ref 70–99)
Potassium: 4.6 mEq/L (ref 3.5–5.1)
SODIUM: 134 meq/L — AB (ref 135–145)

## 2014-09-15 NOTE — Assessment & Plan Note (Signed)
At this point we will give up on rhythm control. I have asked the patient to stop amiodarone. She will maintain low dose of Eliquis.

## 2014-09-15 NOTE — Telephone Encounter (Signed)
Patient was seen in the CHF clinic today. Spoke with a Network engineer who will inform Junie Bame of lab findings.

## 2014-09-15 NOTE — Addendum Note (Signed)
Encounter addended by: Rande Brunt, NP on: 09/15/2014  4:35 PM<BR>     Documentation filed: Notes Section

## 2014-09-15 NOTE — Assessment & Plan Note (Signed)
Her [employer]. Jude device is working normally. Will follow.

## 2014-09-15 NOTE — Assessment & Plan Note (Signed)
Her blood pressure is low today. She is on minimal blood pressure lowering. Will follow.

## 2014-09-15 NOTE — Patient Instructions (Signed)
Decrease lopressor to 25 mg once a day.  Increase lasix to 40 mg twice a day for 2 days and then go back to 40 mg alternating with 20 mg.   Get bilateral compression stockings.   F/U 3 weeks  Do the following things EVERYDAY: 1) Weigh yourself in the morning before breakfast. Write it down and keep it in a log. 2) Take your medicines as prescribed 3) Eat low salt foods-Limit salt (sodium) to 2000 mg per day.  4) Stay as active as you can everyday 5) Limit all fluids for the day to less than 2 liters 6)

## 2014-09-15 NOTE — Telephone Encounter (Signed)
Junie Bame, NP called and spoke w/pt's daughter and advised her of med changes, she will take pt for bmet/bnp on Thursday at Dr Tanna Furry office

## 2014-09-15 NOTE — Telephone Encounter (Signed)
Received call from [facility] regarding pt's labs that were drawn today which showed bun 52 cr 3.9 which is very elevated for pt, labs reviewed by Junie Bame, NP will have pt hold Lasix today, tomorrow and Wed and have labs repeated on Thursday if still elevated pt need RHC, have attempted to call pt and Left message to call back

## 2014-09-15 NOTE — Progress Notes (Addendum)
Patient ID: [name], female   DOB: February 11, 1925, 80 y.o.   MRN: [id number]  PCP: Dr Netty Starring Primary Cardiologist: Dr. Harrington Challenger  HPI: [name] is a 80 y.o. female with the history of nonobstructive CAD by cardiac catheterization in 01/2014, persistent atrial fibrillation, diastolic CHF, HTN, HL, anemia, and CKD. Patient was admitted 04/2014 x2 with acute on chronic diastolic CHF. She underwent cardioversion with restoration of NSR. This resulted in profound bradycardia and hypotension requiring pacemaker implantation. Pacemaker implant was complicated by acute blood loss anemia requiring transfusion with PRBCs  Admitted to [facility] August 21 with increased dyspnea. Diuresed with IV lasix and transitioned to po lasix 40 mg twice a day. GI evaluated due to anemia and black stool. Had EGD with no acute findings. Colonoscopy was cancelled per daughter due to multiple medical problems. She continued on eliquis 2.5 mg twice a day.  Palliative Care consulted and the patient and family request aggressive care for heart failure. Discharge weight was 152 pounds.   Follow up for Heart Failure: Saw Dr. Lovena Le today and Amiodarone discontinued. Overall doing pretty good. Having a lot of anxiety and was started on Ativan PRN. Still reports getting SOB that comes and goes but seems to be more related to anxiety. Denies CP, PND or orthopnea. Uses 2L O2 at night for about 20 min. Weight at home 142-147 lbs. Ambulated with rolling walking. Following a low salt diet and drinking less than 2L a day. + dizziness and fatigue.   Studies:  - LHC (3/15): Mid LM 20-30%, mid RCA 30-40%, EF 65%  - Echo (4/15): Mild LVH, EF 60-65%, no RWMA, mild BAE, trivial eff  - Carotid US (5/15): 40-59% RICA stenosis. 0-86% LICA stenosis.   Labs 07/18/14 K 3.5 Creatinine 1.44 Hgb 10.7  Labs 07/14/14 K 4.1 Creatinine 1.46  Hgb 9.8  Labs 07/04/14 TSH 2.7 Labs 09/06/14: K 4.0, creatinine 1.93  ROS: All systems negative except as listed in  HPI, PMH and Problem List.  Past Medical History  Diagnosis Date  . Dyslipidemia   . Hypertension   . Palpitations     PVCs/bigeminy on event in May 2009 revealing this was relatively asymptomatic  . Chest pain     a. 2011 Neg MV;  b. 01/2014 Cath: LM 20-30, LAD nl, D1 nl, LCX nl, OM1 nl, RCA dom 30-21m, PD/PL nl, EF 65%->Med Rx.  . Degenerative joint disease   . Hx of varicella   . History of skin cancer     tafeen/ non melanoma.   . Thrombocytopenia   . Anemia   . Monoclonal gammopathies   . Right lower quadrant abdominal pain 07/19/2012    Recurrent  With nausea    This is new since she had ct for llq pain in [facility]    R/o appendiceal problem  Hernia  Get ct scan  And plan  Fu     . Diastolic CHF, acute on chronic 01/31/2012  . Atrial fibrillation     a. Dx 01/2014->Eliquis started.  . Cancer   . Pacemaker   . Dysrhythmia     Current Outpatient Prescriptions  Medication Sig Dispense Refill  . apixaban (ELIQUIS) 2.5 MG TABS tablet Take 1 tablet (2.5 mg total) by mouth 2 (two) times daily.    . ferrous sulfate 325 (65 FE) MG tablet Take 1 tablet (325 mg total) by mouth 2 (two) times daily with a meal. 60 tablet 1  . furosemide (LASIX) 40 MG tablet Alternate with 20  mg every other day with 40 mg every other day    . levothyroxine (SYNTHROID, LEVOTHROID) 75 MCG tablet Take 1 tablet (75 mcg total) by mouth daily. 90 tablet 2  . loratadine (CLARITIN) 10 MG tablet Take 10 mg by mouth daily as needed for allergies.     Marland Kitchen LORazepam (ATIVAN) 0.5 MG tablet Take 0.5-1 tablets (0.25-0.5 mg total) by mouth every 8 (eight) hours as needed for anxiety. 30 tablet 1  . metoprolol tartrate (LOPRESSOR) 25 MG tablet Take 1 tablet (25 mg total) by mouth 2 (two) times daily. 30 tablet 0  . nitroGLYCERIN (NITROSTAT) 0.4 MG SL tablet Place 1 tablet (0.4 mg total) under the tongue every 5 (five) minutes as needed. For chest pain (Patient taking differently: Place 0.4 mg under the tongue every 5 (five) minutes  as needed (MAX 3 TABLETS). For chest pain) 25 tablet 6  . pantoprazole (PROTONIX) 40 MG tablet Take 1 tablet (40 mg total) by mouth daily. 30 tablet 5  . potassium chloride SA (KLOR-CON M20) 20 MEQ tablet 40 meq twice daily (Patient taking differently: Take 40 meq by mouth twice daily)    . senna (SENOKOT) 8.6 MG TABS tablet Take 2 tablets (17.2 mg total) by mouth daily as needed for mild constipation. 20 each 0  . simvastatin (ZOCOR) 10 MG tablet Take 1 tablet (10 mg total) by mouth at bedtime.     No current facility-administered medications for this encounter.      Filed Vitals:   09/15/14 1218  BP: 90/58  Pulse: 67  Resp: 18  Weight: 147 lb 8 oz (66.906 kg)  SpO2: 98%     PHYSICAL EXAM: General:  Elderly Chronically ill appearing. No resp difficulty. Ambulated in the clinic with rolling walker, daughter present HEENT: normal Neck: supple. JVP 8. Carotids 2+ bilaterally; no bruits. No lymphadenopathy or thryomegaly appreciated. Cor: PMI normal. Regular rate & regular rhythm. No rubs, gallops or murmurs. Lungs: clear Abdomen: Obese, soft, nontender, nondistended. No hepatosplenomegaly. No bruits or masses. Good bowel sounds. Extremities: no cyanosis, clubbing, rash, 2+ bilateral edema Neuro: alert & orientedx3, cranial nerves grossly intact. Moves all 4 extremities w/o difficulty. Affect pleasant.   ASSESSMENT & PLAN:  1. Chronic Diastolic Heart Failure - NYHA III symptoms and volume status elevated. Will have her take lasix 40 mg BID x 2 days and then go back to alternating 40 mg with 20 mg. She reports she had labs today at [facility]. Repeat BMET next visit.  - SBP low and she is having dizziness. Will cut metoprolol back to 25 mg daily.  - Place TED hose - Reinforced the need and importance of daily weights, a low sodium diet, and fluid restriction (less than 2 L a day). Instructed to call the HF clinic if weight increases more than 3 lbs overnight or 5 lbs in a week.  2.  Chronic A fib- - Appears to be in NSR today. Dr. Lovena Le stopped amiodarone today. I am going to cut metoprolol back to 25 mg daily d/t dizziness and very fatigued. Continue Eliquis 2.5 mg twice a day.  3. HTN - As above having some hypotension with dizziness. Will cut metoprolol back.  4. CKD stage III - Difficult to know what patients baseline creatinine is because about 2 months ago was 1.4 but now has been running closer to 2.0. Pending BMET from Echo.  5. Anxiety - Patient has a lot of anxiety and think that is contributing to her SOB. PCP  started some ativan and hopefully this will help.   F/U 3 weeks Junie Bame B NP-C  12:26 PM   Addendum: Labs reviewed from earlier and creatinine elevated to 3.9, BUN 52 and K+ 4.6. [facility] nurse Janan Halter called and reviewed ICD interrogation and patient was not in afib today but SB rate of 47. As above Dr. Lovena Le already stopped amiodarone and I cut back metoprolol to 25 mg daily. Will have patient hold lasix until Wednesday and then repeat BMET on Thursday. Hoping that renal function will get better with better blood pressure and HR. If renal function not better may need to consider RHC.

## 2014-09-15 NOTE — Assessment & Plan Note (Signed)
She has class 3 symptoms. She is very sedentary. I would like to give her additional lasix but she has had problems with renal insufficiency. I have asked her to maintain a low sodium diet.

## 2014-09-15 NOTE — Patient Instructions (Signed)
Remote monitoring is used to monitor your Pacemaker or ICD from home. This monitoring reduces the number of office visits required to check your device to one time per year. It allows [place] to keep an eye on the functioning of your device to ensure it is working properly. You are scheduled for a device check from home on 12/17/14. You may send your transmission at any time that day. If you have a wireless device, the transmission will be sent automatically. After your physician reviews your transmission, you will receive a postcard with your next transmission date.   Your physician wants you to follow-up in: 12 months with Dr Knox Saliva will receive a reminder letter in the mail two months in advance. If you don't receive a letter, please call our office to schedule the follow-up appointment.  Your physician has recommended you make the following change in your medication:  1) Stop Amiodarone

## 2014-09-15 NOTE — Progress Notes (Signed)
HPI [name] returns today for followup of her PPM. She is a pleasant elderly woman with a h/o symptomatic sinus node dysfunction, s/p PPM insertion who returns today for ongoing evaluation and management of her PPM. She has been found to have atrial fibrillation and has been placed on Eliquis. She feels week but denies syncope. She does not know that she is in atrial fib. She has been on amiodarone without control of her atrial fib.   Allergies  Allergen Reactions  . Tramadol Nausea Only     Current Outpatient Prescriptions  Medication Sig Dispense Refill  . apixaban (ELIQUIS) 2.5 MG TABS tablet Take 1 tablet (2.5 mg total) by mouth 2 (two) times daily.    . ferrous sulfate 325 (65 FE) MG tablet Take 1 tablet (325 mg total) by mouth 2 (two) times daily with a meal. 60 tablet 1  . furosemide (LASIX) 40 MG tablet Alternate with 20 mg every other day with 40 mg every other day    . levothyroxine (SYNTHROID, LEVOTHROID) 75 MCG tablet Take 1 tablet (75 mcg total) by mouth daily. 90 tablet 2  . loratadine (CLARITIN) 10 MG tablet Take 10 mg by mouth daily as needed for allergies.     . metoprolol tartrate (LOPRESSOR) 25 MG tablet Take 1 tablet (25 mg total) by mouth 2 (two) times daily. 30 tablet 0  . nitroGLYCERIN (NITROSTAT) 0.4 MG SL tablet Place 1 tablet (0.4 mg total) under the tongue every 5 (five) minutes as needed. For chest pain (Patient taking differently: Place 0.4 mg under the tongue every 5 (five) minutes as needed (MAX 3 TABLETS). For chest pain) 25 tablet 6  . pantoprazole (PROTONIX) 40 MG tablet Take 1 tablet (40 mg total) by mouth daily. 30 tablet 5  . potassium chloride SA (KLOR-CON M20) 20 MEQ tablet 40 meq twice daily (Patient taking differently: Take 40 meq by mouth twice daily)    . senna (SENOKOT) 8.6 MG TABS tablet Take 2 tablets (17.2 mg total) by mouth daily as needed for mild constipation. 20 each 0  . simvastatin (ZOCOR) 10 MG tablet Take 1 tablet (10 mg  total) by mouth at bedtime.    Marland Kitchen LORazepam (ATIVAN) 0.5 MG tablet Take 0.5-1 tablets (0.25-0.5 mg total) by mouth every 8 (eight) hours as needed for anxiety. 30 tablet 1   No current facility-administered medications for this visit.     Past Medical History  Diagnosis Date  . Dyslipidemia   . Hypertension   . Palpitations     PVCs/bigeminy on event in May 2009 revealing this was relatively asymptomatic  . Chest pain     a. 2011 Neg MV;  b. 01/2014 Cath: LM 20-30, LAD nl, D1 nl, LCX nl, OM1 nl, RCA dom 30-75m, PD/PL nl, EF 65%->Med Rx.  . Degenerative joint disease   . Hx of varicella   . History of skin cancer     tafeen/ non melanoma.   . Thrombocytopenia   . Anemia   . Monoclonal gammopathies   . Right lower quadrant abdominal pain 07/19/2012    Recurrent  With nausea    This is new since she had ct for llq pain in [facility]    R/o appendiceal problem  Hernia  Get ct scan  And plan  Fu     . Diastolic CHF, acute on chronic 01/31/2012  . Atrial fibrillation     a. Dx 01/2014->Eliquis started.  . Cancer   . Pacemaker   .  Dysrhythmia     ROS:   All systems reviewed and negative except as noted in the HPI.   Past Surgical History  Procedure Laterality Date  . Cholecystectomy  1999  . Cesarean section      times 2  . Shoulder surgery  1996    Right   . Cataract extraction      Bilateral  implantt  . Cardioversion N/A 02/26/2014    Procedure: CARDIOVERSION AT BEDSIDE;  Surgeon: Pixie Casino, MD;  Location: [place];  Service: Cardiovascular;  Laterality: N/A;  . Cardioversion N/A 04/22/2014    Procedure: CARDIOVERSION (BEDSIDE);  Surgeon: [name], MD;  Location: [facility] OR;  Service: Cardiovascular;  Laterality: N/A;  . Pacemaker insertion  04/23/2014    STJ Assurity dual chamber pacemaker implanted by Dr Rayann Heman  . Insert / replace / remove pacemaker    . Esophagogastroduodenoscopy N/A 07/06/2014    Procedure: ESOPHAGOGASTRODUODENOSCOPY (EGD);  Surgeon: Ladene Artist, MD;   Location: [facility] ENDOSCOPY;  Service: Endoscopy;  Laterality: N/A;     Family History  Problem Relation Age of Onset  . Coronary artery disease Father   . Heart disease Father   . Lung cancer    . Alzheimer's disease Mother   . Cancer Brother 54    lung cancer  . Heart attack Father      History   Social History  . Marital Status: Widowed    Spouse Name: N/A    Number of Children: 3  . Years of Education: N/A   Occupational History  .      [employer], book keeping   Social History Main Topics  . Smoking status: Never Smoker   . Smokeless tobacco: Never Used  . Alcohol Use: No  . Drug Use: No  . Sexual Activity: Not Currently   Other Topics Concern  . 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 Eats with her.     BP 90/52 mmHg  Pulse 67  Ht 4' 10.5" (1.486 m)  Wt 148 lb (67.132 kg)  BMI 30.40 kg/m2  Physical Exam:  Chronically ill appearing elderly woman, NAD HEENT: Unremarkable Neck:  No JVD, no thyromegally Back:  No CVA tenderness Lungs:  Clear with no wheezes HEART:  IRegular rate rhythm, no murmurs, no rubs, no clicks Abd:  soft, positive bowel sounds, no organomegally, no rebound, no guarding Ext:  2 plus pulses, no edema, no cyanosis, no clubbing Skin:  No rashes no nodules Neuro:  CN II through XII intact, motor grossly intact   DEVICE  Normal device function.  See PaceArt for details.   Assess/Plan:

## 2014-09-15 NOTE — Telephone Encounter (Signed)
Critical lab results printed and taken to Kathleen Argue, PA

## 2014-09-17 ENCOUNTER — Other Ambulatory Visit (HOSPITAL_COMMUNITY): Payer: Self-pay | Admitting: Cardiology

## 2014-09-17 DIAGNOSIS — I5033 Acute on chronic diastolic (congestive) heart failure: Secondary | ICD-10-CM

## 2014-09-17 MED ORDER — POTASSIUM CHLORIDE CRYS ER 20 MEQ PO TBCR
EXTENDED_RELEASE_TABLET | ORAL | Status: DC
Start: 2014-09-17 — End: 2014-09-26

## 2014-09-18 ENCOUNTER — Other Ambulatory Visit (INDEPENDENT_AMBULATORY_CARE_PROVIDER_SITE_OTHER): Payer: Medicare Other

## 2014-09-18 DIAGNOSIS — I5032 Chronic diastolic (congestive) heart failure: Secondary | ICD-10-CM

## 2014-09-18 DIAGNOSIS — R0602 Shortness of breath: Secondary | ICD-10-CM

## 2014-09-18 LAB — BASIC METABOLIC PANEL
BUN: 54 mg/dL — ABNORMAL HIGH (ref 6–23)
CALCIUM: 9.8 mg/dL (ref 8.4–10.5)
CHLORIDE: 104 meq/L (ref 96–112)
CO2: 24 mEq/L (ref 19–32)
CREATININE: 2.9 mg/dL — AB (ref 0.4–1.2)
GFR: 16.56 mL/min — AB (ref >60.00)
Glucose, Bld: 125 mg/dL — ABNORMAL HIGH (ref 70–99)
Potassium: 4.5 mEq/L (ref 3.5–5.1)
Sodium: 136 mEq/L (ref 135–145)

## 2014-09-18 LAB — BRAIN NATRIURETIC PEPTIDE: PRO B NATRI PEPTIDE: 1874 pg/mL — AB (ref 0.0–100.0)

## 2014-09-19 ENCOUNTER — Other Ambulatory Visit: Payer: Self-pay | Admitting: *Deleted

## 2014-09-19 ENCOUNTER — Telehealth (HOSPITAL_COMMUNITY): Payer: Self-pay | Admitting: Cardiology

## 2014-09-19 ENCOUNTER — Telehealth (HOSPITAL_COMMUNITY): Payer: Self-pay | Admitting: Surgery

## 2014-09-19 NOTE — Telephone Encounter (Signed)
Pt scheduled for RHC on 09/22/14 Cpt code- 93460 icd 10 code- i50.33 With pts current insurance - [employer] No pre cert required

## 2014-09-19 NOTE — Telephone Encounter (Signed)
I called to inform [name] of her Heart Cath on Monday at 12:00.   I told her to arrive at Short Stay at 10:00 am.  She was informed not to take Eliquis Sunday or Monday am as well as no Lasix Monday am.  She acknowledges and will be here Monday at 10am.

## 2014-09-22 ENCOUNTER — Inpatient Hospital Stay (HOSPITAL_COMMUNITY)
Admission: RE | Admit: 2014-09-22 | Discharge: 2014-09-26 | DRG: 286 | Disposition: A | Discharge status: Skilled Nursing Facility | Payer: Medicare Other | Source: Ambulatory Visit | Attending: Internal Medicine | Admitting: Internal Medicine

## 2014-09-22 ENCOUNTER — Encounter (HOSPITAL_COMMUNITY)
Admission: RE | Disposition: A | Discharge status: Skilled Nursing Facility | Payer: Medicare Other | Source: Ambulatory Visit | Attending: Internal Medicine

## 2014-09-22 ENCOUNTER — Encounter (HOSPITAL_COMMUNITY): Payer: Self-pay | Admitting: Cardiology

## 2014-09-22 DIAGNOSIS — I5033 Acute on chronic diastolic (congestive) heart failure: Secondary | ICD-10-CM | POA: Diagnosis present

## 2014-09-22 DIAGNOSIS — Z66 Do not resuscitate: Secondary | ICD-10-CM | POA: Diagnosis present

## 2014-09-22 DIAGNOSIS — I481 Persistent atrial fibrillation: Secondary | ICD-10-CM | POA: Diagnosis present

## 2014-09-22 DIAGNOSIS — G4733 Obstructive sleep apnea (adult) (pediatric): Secondary | ICD-10-CM | POA: Diagnosis present

## 2014-09-22 DIAGNOSIS — N179 Acute kidney failure, unspecified: Secondary | ICD-10-CM | POA: Diagnosis present

## 2014-09-22 DIAGNOSIS — F419 Anxiety disorder, unspecified: Secondary | ICD-10-CM | POA: Diagnosis present

## 2014-09-22 DIAGNOSIS — I130 Hypertensive heart and chronic kidney disease with heart failure and stage 1 through stage 4 chronic kidney disease, or unspecified chronic kidney disease: Principal | ICD-10-CM | POA: Diagnosis present

## 2014-09-22 DIAGNOSIS — B372 Candidiasis of skin and nail: Secondary | ICD-10-CM | POA: Diagnosis present

## 2014-09-22 DIAGNOSIS — E785 Hyperlipidemia, unspecified: Secondary | ICD-10-CM | POA: Diagnosis present

## 2014-09-22 DIAGNOSIS — N184 Chronic kidney disease, stage 4 (severe): Secondary | ICD-10-CM | POA: Diagnosis present

## 2014-09-22 DIAGNOSIS — R001 Bradycardia, unspecified: Secondary | ICD-10-CM | POA: Diagnosis present

## 2014-09-22 DIAGNOSIS — M1990 Unspecified osteoarthritis, unspecified site: Secondary | ICD-10-CM | POA: Diagnosis present

## 2014-09-22 DIAGNOSIS — I959 Hypotension, unspecified: Secondary | ICD-10-CM | POA: Diagnosis present

## 2014-09-22 DIAGNOSIS — I2510 Atherosclerotic heart disease of native coronary artery without angina pectoris: Secondary | ICD-10-CM | POA: Diagnosis present

## 2014-09-22 DIAGNOSIS — I482 Chronic atrial fibrillation: Secondary | ICD-10-CM | POA: Diagnosis present

## 2014-09-22 HISTORY — PX: RIGHT HEART CATHETERIZATION: SHX5447

## 2014-09-22 LAB — CBC
HEMATOCRIT: 43 % (ref 36.0–46.0)
HEMOGLOBIN: 13.7 g/dL (ref 12.0–15.0)
MCH: 28.9 pg (ref 26.0–34.0)
MCHC: 31.9 g/dL (ref 30.0–36.0)
MCV: 90.7 fL (ref 78.0–100.0)
Platelets: 112 10*3/uL — ABNORMAL LOW (ref 150–400)
RBC: 4.74 MIL/uL (ref 3.87–5.11)
RDW: 25.6 % — ABNORMAL HIGH (ref 11.5–15.5)
WBC: 5.8 10*3/uL (ref 4.0–10.5)

## 2014-09-22 LAB — POCT I-STAT 3, VENOUS BLOOD GAS (G3P V)
ACID-BASE DEFICIT: 4 mmol/L — AB (ref 0.0–2.0)
Acid-base deficit: 3 mmol/L — ABNORMAL HIGH (ref 0.0–2.0)
BICARBONATE: 20.5 meq/L (ref 20.0–24.0)
Bicarbonate: 21.2 mEq/L (ref 20.0–24.0)
O2 SAT: 75 %
O2 Saturation: 73 %
PCO2 VEN: 33.5 mmHg — AB (ref 45.0–50.0)
PO2 VEN: 39 mmHg (ref 30.0–45.0)
TCO2: 21 mmol/L (ref 0–100)
TCO2: 22 mmol/L (ref 0–100)
pCO2, Ven: 33.6 mmHg — ABNORMAL LOW (ref 45.0–50.0)
pH, Ven: 7.393 — ABNORMAL HIGH (ref 7.250–7.300)
pH, Ven: 7.41 — ABNORMAL HIGH (ref 7.250–7.300)
pO2, Ven: 39 mmHg (ref 30.0–45.0)

## 2014-09-22 LAB — BASIC METABOLIC PANEL
Anion gap: 16 — ABNORMAL HIGH (ref 5–15)
BUN: 39 mg/dL — AB (ref 6–23)
CHLORIDE: 100 meq/L (ref 96–112)
CO2: 21 mEq/L (ref 19–32)
CREATININE: 1.84 mg/dL — AB (ref 0.50–1.10)
Calcium: 9.7 mg/dL (ref 8.4–10.5)
GFR calc Af Amer: 27 mL/min — ABNORMAL LOW (ref >90)
GFR calc non Af Amer: 24 mL/min — ABNORMAL LOW (ref >90)
GLUCOSE: 112 mg/dL — AB (ref 70–99)
POTASSIUM: 4 meq/L (ref 3.7–5.3)
Sodium: 137 mEq/L (ref 137–147)

## 2014-09-22 LAB — PROTIME-INR
INR: 1.98 — ABNORMAL HIGH (ref 0.00–1.49)
Prothrombin Time: 22.7 seconds — ABNORMAL HIGH (ref 11.6–15.2)

## 2014-09-22 LAB — MRSA PCR SCREENING: MRSA by PCR: NEGATIVE

## 2014-09-22 SURGERY — RIGHT HEART CATH
Anesthesia: LOCAL

## 2014-09-22 MED ORDER — NITROGLYCERIN 0.4 MG SL SUBL: 0.4000 mg | SUBLINGUAL_TABLET | SUBLINGUAL | Status: DC | PRN

## 2014-09-22 MED ORDER — FERROUS SULFATE 325 (65 FE) MG PO TABS
325.0000 mg | ORAL_TABLET | Freq: Two times a day (BID) | ORAL | Status: DC
  Administered 2014-09-22 – 2014-09-26 (×9): 325 mg via ORAL
  Filled 2014-09-22 (×10): qty 1

## 2014-09-22 MED ORDER — SODIUM CHLORIDE 0.9 % IJ SOLN
3.0000 mL | INTRAMUSCULAR | Status: DC | PRN
  Administered 2014-09-22: 3 mL via INTRAVENOUS

## 2014-09-22 MED ORDER — LORATADINE 10 MG PO TABS
10.0000 mg | ORAL_TABLET | Freq: Every day | ORAL | Status: DC | PRN
  Filled 2014-09-22: qty 1

## 2014-09-22 MED ORDER — MICONAZOLE NITRATE 2 % EX CREA
TOPICAL_CREAM | Freq: Two times a day (BID) | CUTANEOUS | Status: DC
  Administered 2014-09-22 – 2014-09-26 (×8): via TOPICAL
  Filled 2014-09-22: qty 14

## 2014-09-22 MED ORDER — SODIUM CHLORIDE 0.9 % IJ SOLN: 3.0000 mL | INTRAMUSCULAR | Status: DC | PRN

## 2014-09-22 MED ORDER — SENNA 8.6 MG PO TABS
2.0000 | ORAL_TABLET | Freq: Every day | ORAL | Status: DC | PRN
  Filled 2014-09-22: qty 2

## 2014-09-22 MED ORDER — SODIUM CHLORIDE 0.9 % IV SOLN: 250.0000 mL | INTRAVENOUS | Status: DC | PRN

## 2014-09-22 MED ORDER — LEVOTHYROXINE SODIUM 75 MCG PO TABS
75.0000 ug | ORAL_TABLET | Freq: Every day | ORAL | Status: DC
  Administered 2014-09-22 – 2014-09-26 (×5): 75 ug via ORAL
  Filled 2014-09-22 (×6): qty 1

## 2014-09-22 MED ORDER — LIDOCAINE HCL (PF) 1 % IJ SOLN
INTRAMUSCULAR | Status: AC
Start: 2014-09-22 — End: 2014-09-22
  Filled 2014-09-22: qty 30

## 2014-09-22 MED ORDER — ASPIRIN 81 MG PO CHEW: 81.0000 mg | CHEWABLE_TABLET | ORAL | Status: DC

## 2014-09-22 MED ORDER — ONDANSETRON HCL 4 MG/2ML IJ SOLN: 4.0000 mg | Freq: Four times a day (QID) | INTRAMUSCULAR | Status: DC | PRN

## 2014-09-22 MED ORDER — ACETAMINOPHEN 325 MG PO TABS
650.0000 mg | ORAL_TABLET | ORAL | Status: DC | PRN
  Administered 2014-09-26: 650 mg via ORAL
  Filled 2014-09-22: qty 2

## 2014-09-22 MED ORDER — SIMVASTATIN 10 MG PO TABS
10.0000 mg | ORAL_TABLET | Freq: Every day | ORAL | Status: DC
  Administered 2014-09-22 – 2014-09-23 (×2): 10 mg via ORAL
  Filled 2014-09-22 (×3): qty 1

## 2014-09-22 MED ORDER — SODIUM CHLORIDE 0.9 % IV SOLN: INTRAVENOUS | Status: DC

## 2014-09-22 MED ORDER — HEPARIN (PORCINE) IN NACL 2-0.9 UNIT/ML-% IJ SOLN
INTRAMUSCULAR | Status: AC
  Filled 2014-09-22: qty 500

## 2014-09-22 MED ORDER — APIXABAN 2.5 MG PO TABS
2.5000 mg | ORAL_TABLET | Freq: Two times a day (BID) | ORAL | Status: DC
  Administered 2014-09-22 – 2014-09-26 (×8): 2.5 mg via ORAL
  Filled 2014-09-22 (×9): qty 1

## 2014-09-22 MED ORDER — FLUCONAZOLE 150 MG PO TABS
150.0000 mg | ORAL_TABLET | Freq: Once | ORAL | Status: AC
  Administered 2014-09-22: 150 mg via ORAL
  Filled 2014-09-22: qty 1

## 2014-09-22 MED ORDER — SODIUM CHLORIDE 0.9 % IJ SOLN
3.0000 mL | Freq: Two times a day (BID) | INTRAMUSCULAR | Status: DC
  Administered 2014-09-22 – 2014-09-26 (×8): 3 mL via INTRAVENOUS

## 2014-09-22 MED ORDER — POTASSIUM CHLORIDE CRYS ER 20 MEQ PO TBCR
40.0000 meq | EXTENDED_RELEASE_TABLET | Freq: Two times a day (BID) | ORAL | Status: DC
  Administered 2014-09-22 – 2014-09-23 (×2): 40 meq via ORAL
  Filled 2014-09-22 (×3): qty 2

## 2014-09-22 MED ORDER — ACETAMINOPHEN 325 MG PO TABS: 650.0000 mg | ORAL_TABLET | ORAL | Status: DC | PRN

## 2014-09-22 MED ORDER — SODIUM CHLORIDE 0.9 % IJ SOLN
3.0000 mL | Freq: Two times a day (BID) | INTRAMUSCULAR | Status: DC
  Administered 2014-09-22: 3 mL via INTRAVENOUS

## 2014-09-22 MED ORDER — ASPIRIN 81 MG PO CHEW
CHEWABLE_TABLET | ORAL | Status: AC
Start: 2014-09-22 — End: 2014-09-22
  Administered 2014-09-22: 12:00:00
  Filled 2014-09-22: qty 1

## 2014-09-22 MED ORDER — SODIUM CHLORIDE 0.9 % IJ SOLN: 3.0000 mL | Freq: Two times a day (BID) | INTRAMUSCULAR | Status: DC

## 2014-09-22 MED ORDER — PANTOPRAZOLE SODIUM 40 MG PO TBEC
40.0000 mg | DELAYED_RELEASE_TABLET | Freq: Every day | ORAL | Status: DC
  Administered 2014-09-22 – 2014-09-26 (×5): 40 mg via ORAL
  Filled 2014-09-22 (×5): qty 1

## 2014-09-22 MED ORDER — FUROSEMIDE 10 MG/ML IJ SOLN
80.0000 mg | Freq: Two times a day (BID) | INTRAMUSCULAR | Status: DC
  Administered 2014-09-22 – 2014-09-24 (×4): 80 mg via INTRAVENOUS
  Filled 2014-09-22 (×6): qty 8

## 2014-09-22 MED ORDER — SODIUM CHLORIDE 0.9 % IV SOLN
250.0000 mL | INTRAVENOUS | Status: DC | PRN
Start: 2014-09-22 — End: 2014-09-26

## 2014-09-22 MED ORDER — FENTANYL CITRATE 0.05 MG/ML IJ SOLN
INTRAMUSCULAR | Status: AC
  Filled 2014-09-22: qty 2

## 2014-09-22 MED ORDER — LORAZEPAM 0.5 MG PO TABS
0.2500 mg | ORAL_TABLET | Freq: Three times a day (TID) | ORAL | Status: DC | PRN
  Administered 2014-09-22: 0.5 mg via ORAL
  Administered 2014-09-26: 0.25 mg via ORAL
  Filled 2014-09-22 (×2): qty 1

## 2014-09-22 MED ORDER — APIXABAN 2.5 MG PO TABS: 2.5000 mg | ORAL_TABLET | Freq: Two times a day (BID) | ORAL | Status: DC

## 2014-09-22 MED ORDER — METOPROLOL TARTRATE 25 MG PO TABS
25.0000 mg | ORAL_TABLET | Freq: Two times a day (BID) | ORAL | Status: DC
  Administered 2014-09-22 – 2014-09-24 (×4): 25 mg via ORAL
  Filled 2014-09-22 (×10): qty 1

## 2014-09-22 NOTE — Plan of Care (Signed)
Problem: Consults Goal: Heart Failure Patient Education (See Patient Education module for education specifics.) Outcome: Progressing     

## 2014-09-22 NOTE — H&P (View-Only) (Signed)
Patient ID: [name], female   DOB: [birth year], 80 y.o.   MRN: [id number]  PCP: Dr Netty Starring Primary Cardiologist: Dr. Harrington Challenger  HPI: [name] is a 80 y.o. female with the history of nonobstructive CAD by cardiac catheterization in 01/2014, persistent atrial fibrillation, diastolic CHF, HTN, HL, anemia, and CKD. Patient was admitted 04/2014 x2 with acute on chronic diastolic CHF. She underwent cardioversion with restoration of NSR. This resulted in profound bradycardia and hypotension requiring pacemaker implantation. Pacemaker implant was complicated by acute blood loss anemia requiring transfusion with PRBCs  Admitted to [facility] August 21 with increased dyspnea. Diuresed with IV lasix and transitioned to po lasix 40 mg twice a day. GI evaluated due to anemia and black stool. Had EGD with no acute findings. Colonoscopy was cancelled per daughter due to multiple medical problems. She continued on eliquis 2.5 mg twice a day.  Palliative Care consulted and the patient and family request aggressive care for heart failure. Discharge weight was 152 pounds.   Follow up for Heart Failure: Saw Dr. Lovena Le today and Amiodarone discontinued. Overall doing pretty good. Having a lot of anxiety and was started on Ativan PRN. Still reports getting SOB that comes and goes but seems to be more related to anxiety. Denies CP, PND or orthopnea. Uses 2L O2 at night for about 20 min. Weight at home 142-147 lbs. Ambulated with rolling walking. Following a low salt diet and drinking less than 2L a day. + dizziness and fatigue.   Studies:  - LHC (3/15): Mid LM 20-30%, mid RCA 30-40%, EF 65%  - Echo (4/15): Mild LVH, EF 60-65%, no RWMA, mild BAE, trivial eff  - Carotid US (5/15): 40-59% RICA stenosis. 2-75% LICA stenosis.   Labs 07/18/14 K 3.5 Creatinine 1.44 Hgb 10.7  Labs 07/14/14 K 4.1 Creatinine 1.46  Hgb 9.8  Labs 07/04/14 TSH 2.7 Labs 09/06/14: K 4.0, creatinine 1.93  ROS: All systems negative except as listed in  HPI, PMH and Problem List.  Past Medical History  Diagnosis Date  . Dyslipidemia   . Hypertension   . Palpitations     PVCs/bigeminy on event in May 2009 revealing this was relatively asymptomatic  . Chest pain     a. 2011 Neg MV;  b. 01/2014 Cath: LM 20-30, LAD nl, D1 nl, LCX nl, OM1 nl, RCA dom 30-38m, PD/PL nl, EF 65%->Med Rx.  . Degenerative joint disease   . Hx of varicella   . History of skin cancer     tafeen/ non melanoma.   . Thrombocytopenia   . Anemia   . Monoclonal gammopathies   . Right lower quadrant abdominal pain 07/19/2012    Recurrent  With nausea    This is new since she had ct for llq pain in [facility]    R/o appendiceal problem  Hernia  Get ct scan  And plan  Fu     . Diastolic CHF, acute on chronic 01/31/2012  . Atrial fibrillation     a. Dx 01/2014->Eliquis started.  . Cancer   . Pacemaker   . Dysrhythmia     Current Outpatient Prescriptions  Medication Sig Dispense Refill  . apixaban (ELIQUIS) 2.5 MG TABS tablet Take 1 tablet (2.5 mg total) by mouth 2 (two) times daily.    . ferrous sulfate 325 (65 FE) MG tablet Take 1 tablet (325 mg total) by mouth 2 (two) times daily with a meal. 60 tablet 1  . furosemide (LASIX) 40 MG tablet Alternate with 20  mg every other day with 40 mg every other day    . levothyroxine (SYNTHROID, LEVOTHROID) 75 MCG tablet Take 1 tablet (75 mcg total) by mouth daily. 90 tablet 2  . loratadine (CLARITIN) 10 MG tablet Take 10 mg by mouth daily as needed for allergies.     Marland Kitchen LORazepam (ATIVAN) 0.5 MG tablet Take 0.5-1 tablets (0.25-0.5 mg total) by mouth every 8 (eight) hours as needed for anxiety. 30 tablet 1  . metoprolol tartrate (LOPRESSOR) 25 MG tablet Take 1 tablet (25 mg total) by mouth 2 (two) times daily. 30 tablet 0  . nitroGLYCERIN (NITROSTAT) 0.4 MG SL tablet Place 1 tablet (0.4 mg total) under the tongue every 5 (five) minutes as needed. For chest pain (Patient taking differently: Place 0.4 mg under the tongue every 5 (five) minutes  as needed (MAX 3 TABLETS). For chest pain) 25 tablet 6  . pantoprazole (PROTONIX) 40 MG tablet Take 1 tablet (40 mg total) by mouth daily. 30 tablet 5  . potassium chloride SA (KLOR-CON M20) 20 MEQ tablet 40 meq twice daily (Patient taking differently: Take 40 meq by mouth twice daily)    . senna (SENOKOT) 8.6 MG TABS tablet Take 2 tablets (17.2 mg total) by mouth daily as needed for mild constipation. 20 each 0  . simvastatin (ZOCOR) 10 MG tablet Take 1 tablet (10 mg total) by mouth at bedtime.     No current facility-administered medications for this encounter.      Filed Vitals:   09/15/14 1218  BP: 90/58  Pulse: 67  Resp: 18  Weight: 147 lb 8 oz (66.906 kg)  SpO2: 98%     PHYSICAL EXAM: General:  Elderly Chronically ill appearing. No resp difficulty. Ambulated in the clinic with rolling walker, daughter present HEENT: normal Neck: supple. JVP 8. Carotids 2+ bilaterally; no bruits. No lymphadenopathy or thryomegaly appreciated. Cor: PMI normal. Regular rate & regular rhythm. No rubs, gallops or murmurs. Lungs: clear Abdomen: Obese, soft, nontender, nondistended. No hepatosplenomegaly. No bruits or masses. Good bowel sounds. Extremities: no cyanosis, clubbing, rash, 2+ bilateral edema Neuro: alert & orientedx3, cranial nerves grossly intact. Moves all 4 extremities w/o difficulty. Affect pleasant.   ASSESSMENT & PLAN:  1. Chronic Diastolic Heart Failure - NYHA III symptoms and volume status elevated. Will have her take lasix 40 mg BID x 2 days and then go back to alternating 40 mg with 20 mg. She reports she had labs today at [facility]. Repeat BMET next visit.  - SBP low and she is having dizziness. Will cut metoprolol back to 25 mg daily.  - Place TED hose - Reinforced the need and importance of daily weights, a low sodium diet, and fluid restriction (less than 2 L a day). Instructed to call the HF clinic if weight increases more than 3 lbs overnight or 5 lbs in a week.  2.  Chronic A fib- - Appears to be in NSR today. Dr. Lovena Le stopped amiodarone today. I am going to cut metoprolol back to 25 mg daily d/t dizziness and very fatigued. Continue Eliquis 2.5 mg twice a day.  3. HTN - As above having some hypotension with dizziness. Will cut metoprolol back.  4. CKD stage III - Difficult to know what patients baseline creatinine is because about 2 months ago was 1.4 but now has been running closer to 2.0. Pending BMET from [facility].  5. Anxiety - Patient has a lot of anxiety and think that is contributing to her SOB. PCP  started some ativan and hopefully this will help.   F/U 3 weeks [name]  12:26 PM   Addendum: Labs reviewed from earlier and creatinine elevated to 3.9, BUN 52 and K+ 4.6. [facility] nurse [name] called and reviewed ICD interrogation and patient was not in afib today but SB rate of 47. As above Dr. Lovena Le already stopped amiodarone and I cut back metoprolol to 25 mg daily. Will have patient hold lasix until Wednesday and then repeat BMET on Thursday. Hoping that renal function will get better with better blood pressure and HR. If renal function not better may need to consider RHC.

## 2014-09-22 NOTE — Interval H&P Note (Signed)
History and Physical Interval Note:  09/22/2014 12:15 PM  [name]  has presented today for surgery, with the diagnosis of heart failure  The various methods of treatment have been discussed with the patient and family. After consideration of risks, benefits and other options for treatment, the patient has consented to  Procedure(s): RIGHT HEART CATH (N/A) as a surgical intervention .  The patient's history has been reviewed, patient examined, no change in status, stable for surgery.  I have reviewed the patient's chart and labs.  Questions were answered to the patient's satisfaction.     Cammy Sanjurjo

## 2014-09-22 NOTE — CV Procedure (Addendum)
Cardiac Cath Procedure Note:  Indication:  CHR, worsening renal function  Procedures performed:  1) Right heart catheterization  Description of procedure:   The risks and indication of the procedure were explained. Consent was signed and placed on the chart. An appropriate timeout was taken prior to the procedure. The right neck was prepped and draped in the routine sterile fashion and anesthetized with 1% local lidocaine.   A 7 FR venous sheath was placed in the right internal jugular vein using a modified Seldinger technique. A standard Swan-Ganz catheter was used for the procedure. At end of procedure the Luiz Blare was removed and the sheath was exchanged over a wire for a triple lumen catheter. Positioned confirmed by fluoro and the catheter was sewn into place.   Complications: None apparent.  Findings:  RA = 11 RV = 49/6/10 PA = 49/22 (34) PCW = 18 (v = 25) Fick cardiac output/index = 6.4/4.0 PVR = 2.5 WU FA sat = 95% PA sat = 72%,74%   Plan/Discussion:  Her PCWP looks pretty good but has significant v-waves in wedge tracing as well as elevated R-sided pressures and significant peripheral edema. Will admit for brief stay and attempt IV diuresis as tolerated. Follow CVPs.   Glori Bickers MD 2:22 PM

## 2014-09-22 NOTE — H&P (Addendum)
Advanced Heart Failure Team History and Physical Note    Primary Cardiologist:  Dr. Harrington Challenger  Reason for Admission: Dyspnea/HF   HPI:    HPI: [name] is a 80 y.o. female with the history of nonobstructive CAD by cardiac catheterization in 01/2014, persistent atrial fibrillation, diastolic CHF, HTN, HL, anemia, and CKD. Patient was admitted 04/2014 x2 with acute on chronic diastolic CHF. She underwent cardioversion with restoration of NSR. This resulted in profound bradycardia and hypotension requiring pacemaker implantation. Pacemaker implant was complicated by acute blood loss anemia requiring transfusion with PRBCs  Admitted to [facility] August 21 with increased dyspnea. Diuresed with IV lasix and transitioned to po lasix 40 mg twice a day. GI evaluated due to anemia and black stool. Had EGD with no acute findings. Colonoscopy was cancelled per daughter due to multiple medical problems. She continued on eliquis 2.5 mg twice a day. Palliative Care consulted and the patient and family request aggressive care for heart failure. Discharge weight was 152 pounds.   Was seen recently by Dr. Lovena Le and deemed to have failed amiodarone and this was stopped. Seen in HF Clinic with ongoing Class IIIB symptoms and volume overload but diuresis limited by cardiorenal syndrome with recent Cr up to 3.9 (baseline ~1.5). Underwent outpatient RHC today and admitted for diuresis. Daughter notes she is very fatigued during day and falls asleep in car.   Review of Systems: [y] = yes, [ ]  = no   General: Weight gain [ ] ; Weight loss [ ] ; Anorexia [ ] ; Fatigue [ y]; Fever [ ] ; Chills [ ] ; Weakness Blue.Reese ]  Cardiac: Chest pain/pressure [ ] ; Resting SOB [ y]; Exertional SOB [ y]; Orthopnea [ ] ; Pedal Edema [ y]; Palpitations [ ] ; Syncope [ ] ; Presyncope [ ] ; Paroxysmal nocturnal dyspnea[y ]  Pulmonary: Cough [ ] ; Wheezing[ ] ; Hemoptysis[ ] ; Sputum [ ] ; Snoring [ y]  GI: Vomiting[ ] ; Dysphagia[ ] ; Melena[ ] ; Hematochezia  [ ] ; Heartburn[ ] ; Abdominal pain [ ] ; Constipation Blue.Reese ]; Diarrhea [ ] ; BRBPR [ ]   GU: Hematuria[ ] ; Dysuria [ ] ; Nocturia[ ]   Vascular: Pain in legs with walking [ ] ; Pain in feet with lying flat [ ] ; Non-healing sores [ ] ; Stroke [ ] ; TIA [ ] ; Slurred speech [ ] ;  Neuro: Headaches[ ] ; Vertigo[ ] ; Seizures[ ] ; Paresthesias[ ] ;Blurred vision [ ] ; Diplopia [ ] ; Vision changes [ ]   Ortho/Skin: Arthritis Blue.Reese ]; Joint pain Blue.Reese ]; Muscle pain [ ] ; Joint swelling [ ] ; Back Pain Blue.Reese ]; Rash [ ]   Psych: Depression[ ] ; Anxiety[ y]  Heme: Bleeding problems [ ] ; Clotting disorders [ ] ; Anemia [ ]   Endocrine: Diabetes [ ] ; Thyroid dysfunction[ ]   Home Medications Prior to Admission medications   Medication Sig Start Date End Date Taking? Authorizing Provider  amiodarone (PACERONE) 200 MG tablet Take 200 mg by mouth 2 (two) times daily. 06/30/14  Yes Historical Provider, MD  apixaban (ELIQUIS) 2.5 MG TABS tablet Take 1 tablet (2.5 mg total) by mouth 2 (two) times daily. 05/02/14  Yes Domenic Polite, MD  ferrous sulfate 325 (65 FE) MG tablet Take 1 tablet (325 mg total) by mouth 2 (two) times daily with a meal. 09/09/14  Yes Burnis Medin, MD  furosemide (LASIX) 40 MG tablet Alternate with 20 mg every other day with 40 mg every other day 08/27/14  Yes Liliane Shi, PA-C  levothyroxine (SYNTHROID, LEVOTHROID) 75 MCG tablet Take 1 tablet (75 mcg total) by mouth daily. 01/16/14  Yes Fay Records, MD  loratadine (CLARITIN) 10 MG tablet Take 10 mg by mouth daily as needed for allergies.    Yes Historical Provider, MD  metoprolol tartrate (LOPRESSOR) 25 MG tablet Take 1 tablet (25 mg total) by mouth 2 (two) times daily. 07/12/14  Yes Domenic Polite, MD  pantoprazole (PROTONIX) 40 MG tablet Take 1 tablet (40 mg total) by mouth daily. 08/11/14  Yes Burnis Medin, MD  potassium chloride SA (KLOR-CON M20) 20 MEQ tablet Take 40 meq by mouth twice daily 09/17/14  Yes Jolaine Artist, MD  simvastatin (ZOCOR) 10 MG tablet  Take 1 tablet (10 mg total) by mouth at bedtime. Patient taking differently: Take 20 mg by mouth at bedtime.  05/29/14  Yes Scott T Kathlen Mody, PA-C  LORazepam (ATIVAN) 0.5 MG tablet Take 0.5-1 tablets (0.25-0.5 mg total) by mouth every 8 (eight) hours as needed for anxiety. 09/09/14   Burnis Medin, MD  nitroGLYCERIN (NITROSTAT) 0.4 MG SL tablet Place 1 tablet (0.4 mg total) under the tongue every 5 (five) minutes as needed. For chest pain Patient taking differently: Place 0.4 mg under the tongue every 5 (five) minutes as needed (MAX 3 TABLETS). For chest pain 12/02/13   Fay Records, MD    Past Medical History: Past Medical History  Diagnosis Date  . Dyslipidemia   . Hypertension   . Palpitations     PVCs/bigeminy on event in May 2009 revealing this was relatively asymptomatic  . Chest pain     a. 2011 Neg MV;  b. 01/2014 Cath: LM 20-30, LAD nl, D1 nl, LCX nl, OM1 nl, RCA dom 30-25m, PD/PL nl, EF 65%->Med Rx.  . Degenerative joint disease   . Hx of varicella   . History of skin cancer     tafeen/ non melanoma.   . Thrombocytopenia   . Anemia   . Monoclonal gammopathies   . Right lower quadrant abdominal pain 07/19/2012    Recurrent  With nausea    This is new since she had ct for llq pain in [facility]    R/o appendiceal problem  Hernia  Get ct scan  And plan  Fu     . Diastolic CHF, acute on chronic 01/31/2012  . Atrial fibrillation     a. Dx 01/2014->Eliquis started.  . Cancer   . Pacemaker   . Dysrhythmia     Past Surgical History: Past Surgical History  Procedure Laterality Date  . Cholecystectomy  1999  . Cesarean section      times 2  . Shoulder surgery  1996    Right   . Cataract extraction      Bilateral  implantt  . Cardioversion N/A 02/26/2014    Procedure: CARDIOVERSION AT BEDSIDE;  Surgeon: Pixie Casino, MD;  Location: [place];  Service: Cardiovascular;  Laterality: N/A;  . Cardioversion N/A 04/22/2014    Procedure: CARDIOVERSION (BEDSIDE);  Surgeon: [name], MD;   Location: [facility] OR;  Service: Cardiovascular;  Laterality: N/A;  . Pacemaker insertion  04/23/2014    STJ Assurity dual chamber pacemaker implanted by Dr Rayann Heman  . Insert / replace / remove pacemaker    . Esophagogastroduodenoscopy N/A 07/06/2014    Procedure: ESOPHAGOGASTRODUODENOSCOPY (EGD);  Surgeon: Ladene Artist, MD;  Location: [facility] ENDOSCOPY;  Service: Endoscopy;  Laterality: N/A;    Family History: Family History  Problem Relation Age of Onset  . Coronary artery disease Father   . Heart disease Father   . Lung  cancer    . Alzheimer's disease Mother   . Cancer Brother 24    lung cancer  . Heart attack Father     Social History: History   Social History  . Marital Status: Widowed    Spouse Name: N/A    Number of Children: 3  . Years of Education: N/A   Occupational History  .      [employer], book keeping   Social History Main Topics  . Smoking status: Never Smoker   . Smokeless tobacco: Never Used  . Alcohol Use: No  . Drug Use: No  . Sexual Activity: Not Currently   Other Topics Concern  . 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 Eats with her.    Allergies:  Allergies  Allergen Reactions  . Tramadol Nausea Only    Objective:    Vital Signs:   Temp:  [97.4 F (36.3 C)-97.8 F (36.6 C)] 97.8 F (36.6 C) (11/09 1615) Pulse Rate:  [38-77] 69 (11/09 1745) Resp:  [13-22] 18 (11/09 1800) BP: (70-138)/(38-107) 95/52 mmHg (11/09 1800) SpO2:  [85 %-100 %] 96 % (11/09 1745) Weight:  [67.132 kg (148 lb)-68.1 kg (150 lb 2.1 oz)] 68.1 kg (150 lb 2.1 oz) (11/09 1600)   Filed Weights   09/22/14 1054 09/22/14 1600  Weight: 67.132 kg (148 lb) 68.1 kg (150 lb 2.1 oz)    Physical Exam: General: Elderly Chronically ill appearing. No  resp difficulty. HEENT: normal Neck: supple. JVP 10. Carotids 2+ bilaterally; no bruits. No lymphadenopathy or thryomegaly appreciated. Cor: PMI normal. Iregular rate & regular rhythm. No rubs, gallops or murmurs. Lungs: clear Abdomen: Obese, soft, nontender, nondistended. No hepatosplenomegaly. No bruits or masses. Good bowel sounds. Groin - severe candidial rash with skin breakdown Extremities: no cyanosis, clubbing, rash, 2+ bilateral edema Neuro: alert & orientedx3, cranial nerves grossly intact. Moves all 4 extremities w/o difficulty. Affect pleasant.    Labs: Basic Metabolic Panel:  Recent Labs Lab 09/18/14 1317 09/22/14 1119  NA 136 137  K 4.5 4.0  CL 104 100  CO2 24 21  GLUCOSE 125* 112*  BUN 54* 39*  CREATININE 2.9* 1.84*  CALCIUM 9.8 9.7    Liver Function Tests: No results for input(s): AST, ALT, ALKPHOS, BILITOT, PROT, ALBUMIN in the last 168 hours. No results for input(s): LIPASE, AMYLASE in the last 168 hours. No results for input(s): AMMONIA in the last 168 hours.  CBC:  Recent Labs Lab 09/22/14 1119  WBC 5.8  HGB 13.7  HCT 43.0  MCV 90.7  PLT 112*    Cardiac Enzymes: No results for input(s): CKTOTAL, CKMB, CKMBINDEX, TROPONINI in the last 168 hours.  BNP: BNP (last 3 results)  Recent Labs  07/18/14 0921 08/27/14 1147 09/18/14 1317  PROBNP 2626.0* 914.0* 1874.0*    CBG: No results for input(s): GLUCAP in the last 168 hours.  Coagulation Studies:  Recent Labs  09/22/14 1119  LABPROT 22.7*  INR 1.98*    Other results: EKG: pending  Imaging:  No results found.      Assessment:   1. Acute on chronic diastolic HF with cardiorenal syndrome 2. Chronic AF 3. Symptomatic bradycardia  s/p recent PPM 4. CKD, stage 3 5. Groin candidiasis 6. ? OSA 7. Acute on chronic renal failure, stage 4 (baseline Cr 1.5)  Plan/Discussion:    She has severe ongoing dyspnea. RHC with mildly elevated pressures R>L complicated by cardiorenal  syndrome. Will bring in for observation and attempt careful IV diuresis as renal function tolerates. I suspect some of her symptoms may be due to poor tolerance of AF but she has failed rhythm control. Will continue Eliquis for AF. Treat candidiasis with Monistat-3 and one dose fluconazole. Watch for nocturnal desats suggestive of OSA.   Daniel Bensimhon,MD 7:01 PM  Length of Stay: 0   Advanced Heart Failure Team Pager [phone] (M-F; 7a - 4p)  Please contact [facility] for night-coverage after hours (4p -7a ) and weekends on [email or web address]

## 2014-09-23 LAB — BASIC METABOLIC PANEL
Anion gap: 11 (ref 5–15)
BUN: 31 mg/dL — ABNORMAL HIGH (ref 6–23)
CO2: 25 mEq/L (ref 19–32)
Calcium: 9.1 mg/dL (ref 8.4–10.5)
Chloride: 105 mEq/L (ref 96–112)
Creatinine, Ser: 1.59 mg/dL — ABNORMAL HIGH (ref 0.50–1.10)
GFR calc Af Amer: 33 mL/min — ABNORMAL LOW (ref >90)
GFR calc non Af Amer: 28 mL/min — ABNORMAL LOW (ref >90)
GLUCOSE: 125 mg/dL — AB (ref 70–99)
Potassium: 4.1 mEq/L (ref 3.7–5.3)
Sodium: 141 mEq/L (ref 137–147)

## 2014-09-23 LAB — CARBOXYHEMOGLOBIN
Carboxyhemoglobin: 1.5 % (ref 0.5–1.5)
Methemoglobin: 1.1 % (ref 0.0–1.5)
O2 Saturation: 56 %
Total hemoglobin: 13 g/dL (ref 12.0–16.0)

## 2014-09-23 MED ORDER — CETYLPYRIDINIUM CHLORIDE 0.05 % MT LIQD
7.0000 mL | Freq: Two times a day (BID) | OROMUCOSAL | Status: DC
  Administered 2014-09-23 – 2014-09-26 (×6): 7 mL via OROMUCOSAL

## 2014-09-23 MED ORDER — POTASSIUM CHLORIDE CRYS ER 20 MEQ PO TBCR
20.0000 meq | EXTENDED_RELEASE_TABLET | Freq: Two times a day (BID) | ORAL | Status: DC
  Administered 2014-09-23 – 2014-09-26 (×6): 20 meq via ORAL
  Filled 2014-09-23 (×6): qty 1

## 2014-09-23 MED ORDER — AMIODARONE HCL 200 MG PO TABS
200.0000 mg | ORAL_TABLET | Freq: Every day | ORAL | Status: DC
  Administered 2014-09-23 – 2014-09-26 (×4): 200 mg via ORAL
  Filled 2014-09-23 (×4): qty 1

## 2014-09-23 NOTE — Plan of Care (Signed)
Problem: Phase I Progression Outcomes Goal: Pain controlled with appropriate interventions Outcome: Completed/Met Date Met:  09/23/14 Goal: Voiding-avoid urinary catheter unless indicated Outcome: Completed/Met Date Met:  09/23/14 Goal: Hemodynamically stable Outcome: Completed/Met Date Met:  09/23/14  Problem: Phase II Progression Outcomes Goal: Pain controlled Outcome: Completed/Met Date Met:  09/23/14 Goal: Tolerating diet Outcome: Completed/Met Date Met:  09/23/14

## 2014-09-23 NOTE — Plan of Care (Signed)
Problem: Phase I Progression Outcomes Goal: Dyspnea controlled at rest (HF) Outcome: Completed/Met Date Met:  09/23/14  Problem: Phase II Progression Outcomes Goal: Walk in hall or up in chair TID Outcome: Completed/Met Date Met:  09/23/14 Goal: Fluid volume status improved Outcome: Completed/Met Date Met:  09/23/14  Problem: Phase III Progression Outcomes Goal: Tolerating diet Outcome: Completed/Met Date Met:  09/23/14

## 2014-09-23 NOTE — Progress Notes (Signed)
Advanced Heart Failure Rounding Note   Subjective:    Breathing better. Diuresed 1.3 L. Weight down 4 pounds. CVP 10-11 with significant respiratory variation.     Objective:   Weight Range:  Vital Signs:   Temp:  [97.4 F (36.3 C)-98.5 F (36.9 C)] 98.5 F (36.9 C) (11/10 0730) Pulse Rate:  [38-110] 69 (11/10 0923) Resp:  [13-22] 13 (11/10 0730) BP: (70-138)/(36-107) 98/58 mmHg (11/10 0923) SpO2:  [85 %-100 %] 99 % (11/10 0730) Weight:  [66.3 kg (146 lb 2.6 oz)-68.1 kg (150 lb 2.1 oz)] 66.3 kg (146 lb 2.6 oz) (11/10 0450)    Weight change: Filed Weights   09/22/14 1054 09/22/14 1600 09/23/14 0450  Weight: 67.132 kg (148 lb) 68.1 kg (150 lb 2.1 oz) 66.3 kg (146 lb 2.6 oz)    Intake/Output:   Intake/Output Summary (Last 24 hours) at 09/23/14 1022 Last data filed at 09/23/14 0900  Gross per 24 hour  Intake    820 ml  Output   2330 ml  Net  -1510 ml     Physical Exam: General: Elderly Chronically ill appearing. Sitting in chair No resp difficulty. HEENT: normal Neck: supple. RIJ TLC. JVP 10. Carotids 2+ bilaterally; no bruits. No lymphadenopathy or thryomegaly appreciated. Cor: PMI normal.Regular . No rubs, gallops or murmurs. Lungs: clear Abdomen: Obese, soft, nontender, nondistended. No hepatosplenomegaly. No bruits or masses. Good bowel sounds. Groin -  candidial rash with skin breakdown Extremities: no cyanosis, clubbing, rash, trace -1+ bilateral edema + varicose veins Neuro: alert & orientedx3, cranial nerves grossly intact. Moves all 4 extremities w/o difficulty. Affect pleasant.  Telemetry: NSR 50-60s  Labs: Basic Metabolic Panel:  Recent Labs Lab 09/18/14 1317 09/22/14 1119  NA 136 137  K 4.5 4.0  CL 104 100  CO2 24 21  GLUCOSE 125* 112*  BUN 54* 39*  CREATININE 2.9* 1.84*  CALCIUM 9.8 9.7    Liver Function Tests: No results for input(s): AST, ALT, ALKPHOS, BILITOT, PROT, ALBUMIN in the last 168 hours. No results for input(s): LIPASE,  AMYLASE in the last 168 hours. No results for input(s): AMMONIA in the last 168 hours.  CBC:  Recent Labs Lab 09/22/14 1119  WBC 5.8  HGB 13.7  HCT 43.0  MCV 90.7  PLT 112*    Cardiac Enzymes: No results for input(s): CKTOTAL, CKMB, CKMBINDEX, TROPONINI in the last 168 hours.  BNP: BNP (last 3 results)  Recent Labs  07/18/14 0921 08/27/14 1147 09/18/14 1317  PROBNP 2626.0* 914.0* 1874.0*     Other results:    Imaging:  No results found.   Medications:     Scheduled Medications: . apixaban  2.5 mg Oral BID  . ferrous sulfate  325 mg Oral BID WC  . furosemide  80 mg Intravenous BID  . levothyroxine  75 mcg Oral QAC breakfast  . metoprolol tartrate  25 mg Oral BID  . miconazole   Topical BID  . pantoprazole  40 mg Oral Daily  . potassium chloride SA  40 mEq Oral BID  . simvastatin  10 mg Oral QHS  . sodium chloride  3 mL Intravenous Q12H     Infusions:     PRN Medications:  sodium chloride, acetaminophen, loratadine, LORazepam, nitroGLYCERIN, ondansetron (ZOFRAN) IV, senna, sodium chloride   Assessment:   Assessment:   1. Acute on chronic diastolic HF with cardiorenal syndrome 2. Chronic AF 3. Symptomatic bradycardia s/p recent PPM 4. Acute on chronic renal failure, stage 4 (baseline Cr 1.5) 5.  Groin candidiasis 6. ? OSA   Plan/Discussion:     She is improved with diuresis this am. Need to check co-ox and BMET. Will continue lasix for now and place TED hose. I think she has advanced diastolic HF with cardiorenal syndrome and possibly sleep apnea. We will do our best to optimize her but may be getting to point where we have to consider goals of care and palliative options. Will discuss code status with family this admit.   Previously though to have failed rhythm control with amio but now back in NSR. I think this helps her. Will restart amio at 200 daily. Continue apixaban  Quillian Quince Margorie Renner,MD 10:27 AM   Length of Stay: 1 Advanced  Heart Failure Team Pager [phone] (M-F; 7a - 4p)  Please contact [facility] for night-coverage after hours (4p -7a ) and weekends on [email or web address]

## 2014-09-23 NOTE — Plan of Care (Signed)
Problem: Phase I Progression Outcomes Goal: Up in chair, BRP Outcome: Completed/Met Date Met:  09/23/14

## 2014-09-23 NOTE — Care Management Note (Addendum)
    Page 1 of 1   09/26/2014     10:16:04 AM CARE MANAGEMENT NOTE 09/26/2014  Patient:  KALIOPI, BLYDEN A   Account Number:  [id number]  Date Initiated:  09/23/2014  Documentation initiated by:  Elissa Hefty  Subjective/Objective Assessment:   adm w heart failure     Action/Plan:   lives alone, pcp dr w Regis Bill   Anticipated DC Date:     Anticipated DC Plan:  [place]  In-house referral  Clinical Social Worker      DC Planning Services  CM consult      [facility] Choice  Resumption Of Svcs/PTA Provider   Choice offered to / List presented to:          [facility] arranged  HH-1 RN  [place]   Status of service:   Medicare Important Message given?  YES (If response is "NO", the following Medicare IM given date fields will be blank) Date Medicare IM given:  09/25/2014 Medicare IM given by:  Elissa Hefty Date Additional Medicare IM given:   Additional Medicare IM given by:    Discharge Disposition:  [place]  Per UR Regulation:    If discussed at Long Length of Stay Meetings, dates discussed:    Comments:  11/13 1014 debbie Steffie Waggoner rn,bsn pt would like to go to [facility]. sw ref was made by md. md had rec snf earlier in hosp but pt had not wanted to go. spoke w phy ther and they feel snf will be better for pt also. sw to see and begin snf search.

## 2014-09-23 NOTE — Progress Notes (Signed)
UR completed 

## 2014-09-23 NOTE — Progress Notes (Signed)
Have been refusing to go back to bed.explained that she'd been in chair since this am but insisted not to go back.

## 2014-09-24 DIAGNOSIS — R001 Bradycardia, unspecified: Secondary | ICD-10-CM | POA: Diagnosis present

## 2014-09-24 DIAGNOSIS — N184 Chronic kidney disease, stage 4 (severe): Secondary | ICD-10-CM | POA: Diagnosis present

## 2014-09-24 DIAGNOSIS — Z66 Do not resuscitate: Secondary | ICD-10-CM | POA: Diagnosis present

## 2014-09-24 DIAGNOSIS — F419 Anxiety disorder, unspecified: Secondary | ICD-10-CM | POA: Diagnosis present

## 2014-09-24 DIAGNOSIS — N179 Acute kidney failure, unspecified: Secondary | ICD-10-CM | POA: Diagnosis present

## 2014-09-24 DIAGNOSIS — G4733 Obstructive sleep apnea (adult) (pediatric): Secondary | ICD-10-CM | POA: Diagnosis present

## 2014-09-24 DIAGNOSIS — I959 Hypotension, unspecified: Secondary | ICD-10-CM | POA: Diagnosis present

## 2014-09-24 DIAGNOSIS — R0602 Shortness of breath: Secondary | ICD-10-CM | POA: Diagnosis present

## 2014-09-24 DIAGNOSIS — I130 Hypertensive heart and chronic kidney disease with heart failure and stage 1 through stage 4 chronic kidney disease, or unspecified chronic kidney disease: Secondary | ICD-10-CM | POA: Diagnosis present

## 2014-09-24 DIAGNOSIS — E785 Hyperlipidemia, unspecified: Secondary | ICD-10-CM | POA: Diagnosis present

## 2014-09-24 DIAGNOSIS — I5033 Acute on chronic diastolic (congestive) heart failure: Secondary | ICD-10-CM | POA: Diagnosis present

## 2014-09-24 DIAGNOSIS — I2510 Atherosclerotic heart disease of native coronary artery without angina pectoris: Secondary | ICD-10-CM | POA: Diagnosis present

## 2014-09-24 DIAGNOSIS — B372 Candidiasis of skin and nail: Secondary | ICD-10-CM | POA: Diagnosis present

## 2014-09-24 DIAGNOSIS — I482 Chronic atrial fibrillation: Secondary | ICD-10-CM | POA: Diagnosis present

## 2014-09-24 DIAGNOSIS — M1990 Unspecified osteoarthritis, unspecified site: Secondary | ICD-10-CM | POA: Diagnosis present

## 2014-09-24 DIAGNOSIS — I481 Persistent atrial fibrillation: Secondary | ICD-10-CM | POA: Diagnosis present

## 2014-09-24 LAB — BASIC METABOLIC PANEL
Anion gap: 13 (ref 5–15)
BUN: 27 mg/dL — ABNORMAL HIGH (ref 6–23)
CO2: 25 mEq/L (ref 19–32)
Calcium: 9.3 mg/dL (ref 8.4–10.5)
Chloride: 105 mEq/L (ref 96–112)
Creatinine, Ser: 1.6 mg/dL — ABNORMAL HIGH (ref 0.50–1.10)
GFR calc non Af Amer: 28 mL/min — ABNORMAL LOW (ref >90)
GFR, EST AFRICAN AMERICAN: 32 mL/min — AB (ref >90)
Glucose, Bld: 124 mg/dL — ABNORMAL HIGH (ref 70–99)
POTASSIUM: 4.3 meq/L (ref 3.7–5.3)
SODIUM: 143 meq/L (ref 137–147)

## 2014-09-24 LAB — CARBOXYHEMOGLOBIN
Carboxyhemoglobin: 1.2 % (ref 0.5–1.5)
Methemoglobin: 0.8 % (ref 0.0–1.5)
O2 SAT: 61.5 %
Total hemoglobin: 14.5 g/dL (ref 12.0–16.0)

## 2014-09-24 MED ORDER — SIMVASTATIN 20 MG PO TABS
20.0000 mg | ORAL_TABLET | Freq: Every day | ORAL | Status: DC
  Administered 2014-09-24 – 2014-09-25 (×2): 20 mg via ORAL
  Filled 2014-09-24 (×3): qty 1

## 2014-09-24 MED ORDER — TORSEMIDE 20 MG PO TABS
20.0000 mg | ORAL_TABLET | Freq: Two times a day (BID) | ORAL | Status: DC
  Administered 2014-09-24 – 2014-09-26 (×5): 20 mg via ORAL
  Filled 2014-09-24 (×6): qty 1

## 2014-09-24 NOTE — Plan of Care (Signed)
Problem: Phase II Progression Outcomes Goal: Dyspnea controlled with activity Outcome: Completed/Met Date Met:  09/24/14

## 2014-09-24 NOTE — Evaluation (Signed)
Physical Therapy Evaluation Patient Details Name: [name] MRN: [id number] DOB: 09/27/1925 Today's Date: 09/24/2014   History of Present Illness  Pt adm with acute on chronic diastolic HF with cardiorenal syndrome. PMH- pacer, a-fib, HTN  Clinical Impression  Pt admitted with above. Pt currently with functional limitations due to the deficits listed below (see PT Problem List).  Pt will benefit from skilled PT to increase their independence and safety with mobility to allow discharge to the venue listed below. Pt does not want to go to SNF. Family is very supportive and provides needed assistance.      Follow Up Recommendations Home health PT;Supervision for mobility/OOB    Equipment Recommendations  None recommended by PT    Recommendations for Other Services       Precautions / Restrictions Precautions Precautions: Fall Restrictions Weight Bearing Restrictions: No      Mobility  Bed Mobility               General bed mobility comments: Pt sitting in chair.  Transfers Overall transfer level: Needs assistance Equipment used: 4-wheeled walker Transfers: Stand Pivot Transfers Sit to Stand: Min assist Stand pivot transfers: Min guard (for chair to bsc to chair.)       General transfer comment: Assist to bring hips up.  Ambulation/Gait Ambulation/Gait assistance: Min guard Ambulation Distance (Feet): 250 Feet Assistive device: 4-wheeled walker Gait Pattern/deviations: Step-through pattern;Decreased step length - right;Decreased step length - left;Shuffle;Trunk flexed   Gait velocity interpretation: Below normal speed for age/gender General Gait Details: Pt leaning on rollator in flexed posture due to kyphosis.  Stairs            Wheelchair Mobility    Modified Rankin (Stroke Patients Only)       Balance Overall balance assessment: Needs assistance Sitting-balance support: No upper extremity supported;Feet supported Sitting  balance-Leahy Scale: Good     Standing balance support: No upper extremity supported;During functional activity Standing balance-Leahy Scale: Fair                               Pertinent Vitals/Pain Pain Assessment: No/denies pain    Home Living Family/patient expects to be discharged to:: Private residence Living Arrangements: Alone Available Help at Discharge: Family;Available PRN/intermittently Type of Home: House Home Access: Stairs to enter Entrance Stairs-Rails: Can reach both;Left;Right Entrance Stairs-Number of Steps: 4 Home Layout: One level Home Equipment: Walker - 4 wheels;Cane - single point;Grab bars - toilet;Grab bars - tub/shower Additional Comments: Pt's daughter checks on her frequently during the day. Lives only a few minutes a day.    Prior Function Level of Independence: Needs assistance   Gait / Transfers Assistance Needed: Amb with rollator. Uses O2.  ADL's / Homemaking Assistance Needed: Family brings meals        Hand Dominance   Dominant Hand: Right    Extremity/Trunk Assessment   Upper Extremity Assessment: Generalized weakness           Lower Extremity Assessment: Generalized weakness      Cervical / Trunk Assessment: Kyphotic  Communication   Communication: HOH  Cognition Arousal/Alertness: Awake/alert Behavior During Therapy: WFL for tasks assessed/performed Overall Cognitive Status: Within Functional Limits for tasks assessed                      General Comments      Exercises        Assessment/Plan  PT Assessment Patient needs continued PT services  PT Diagnosis Difficulty walking;Generalized weakness   PT Problem List Decreased strength;Decreased activity tolerance;Decreased balance;Decreased mobility;Cardiopulmonary status limiting activity  PT Treatment Interventions DME instruction;Balance training;Gait training;Functional mobility training;Therapeutic activities;Therapeutic  exercise;Patient/family education   PT Goals (Current goals can be found in the Care Plan section) Acute Rehab PT Goals Patient Stated Goal: Return home PT Goal Formulation: With patient Time For Goal Achievement: 10/01/14 Potential to Achieve Goals: Good    Frequency Min 3X/week   Barriers to discharge        Co-evaluation               End of Session Equipment Utilized During Treatment: Gait belt;Oxygen Activity Tolerance: Patient tolerated treatment well Patient left: in chair;with call bell/phone within reach Nurse Communication: Mobility status         Time: 6295-2841 PT Time Calculation (min) (ACUTE ONLY): 40 min   Charges:   PT Evaluation $Initial PT Evaluation Tier I: 1 Procedure PT Treatments $Gait Training: 23-37 mins   PT G Codes:          Hanan Mcwilliams October 06, 2014, 11:25 AM   Suanne Marker PT [phone]

## 2014-09-24 NOTE — Plan of Care (Signed)
Problem: Phase I Progression Outcomes Goal: EF % per last Echo/documented,Core Reminder form on chart Outcome: Completed/Met Date Met:  09/24/14

## 2014-09-24 NOTE — Progress Notes (Addendum)
Advanced Heart Failure Rounding Note   Subjective:    Breathing better. Diuresing well. Weight down another 3 pounds. Renal function stable. CVP 10-11 with significant respiratory variation. Co-ox 62%    Objective:   Weight Range:  Vital Signs:   Temp:  [97.3 F (36.3 C)-98.6 F (37 C)] 97.3 F (36.3 C) (11/11 0802) Pulse Rate:  [43-78] 57 (11/11 0802) Resp:  [13-24] 17 (11/11 0802) BP: (85-102)/(25-76) 91/70 mmHg (11/11 0802) SpO2:  [94 %-100 %] 95 % (11/11 0802) Weight:  [65 kg (143 lb 4.8 oz)] 65 kg (143 lb 4.8 oz) (11/11 0404) Last BM Date: 09/23/14  Weight change: Filed Weights   09/22/14 1600 09/23/14 0450 09/24/14 0404  Weight: 68.1 kg (150 lb 2.1 oz) 66.3 kg (146 lb 2.6 oz) 65 kg (143 lb 4.8 oz)    Intake/Output:   Intake/Output Summary (Last 24 hours) at 09/24/14 0918 Last data filed at 09/24/14 0800  Gross per 24 hour  Intake    860 ml  Output   2450 ml  Net  -1590 ml     Physical Exam: General: Elderly Chronically ill appearing. Sitting in chair No resp difficulty. HEENT: normal Neck: supple. RIJ TLC. JVP 10. Carotids 2+ bilaterally; no bruits. No lymphadenopathy or thryomegaly appreciated. Cor: PMI normal.Regular . No rubs, gallops or murmurs. Lungs: clear Abdomen: Obese, soft, nontender, nondistended. No hepatosplenomegaly. No bruits or masses. Good bowel sounds. Groin -  candidial rash with skin breakdown Extremities: no cyanosis, clubbing, rash, ted hose on. no edema + varicose veins Neuro: alert & orientedx3, cranial nerves grossly intact. Moves all 4 extremities w/o difficulty. Affect pleasant.  Telemetry: NSR 50-60s  Labs: Basic Metabolic Panel:  Recent Labs Lab 09/18/14 1317 09/22/14 1119 09/23/14 1100 09/24/14 0445  NA 136 137 141 143  K 4.5 4.0 4.1 4.3  CL 104 100 105 105  CO2 24 21 25 25   GLUCOSE 125* 112* 125* 124*  BUN 54* 39* 31* 27*  CREATININE 2.9* 1.84* 1.59* 1.60*  CALCIUM 9.8 9.7 9.1 9.3    Liver Function Tests: No  results for input(s): AST, ALT, ALKPHOS, BILITOT, PROT, ALBUMIN in the last 168 hours. No results for input(s): LIPASE, AMYLASE in the last 168 hours. No results for input(s): AMMONIA in the last 168 hours.  CBC:  Recent Labs Lab 09/22/14 1119  WBC 5.8  HGB 13.7  HCT 43.0  MCV 90.7  PLT 112*    Cardiac Enzymes: No results for input(s): CKTOTAL, CKMB, CKMBINDEX, TROPONINI in the last 168 hours.  BNP: BNP (last 3 results)  Recent Labs  07/18/14 0921 08/27/14 1147 09/18/14 1317  PROBNP 2626.0* 914.0* 1874.0*     Other results:    Imaging: No results found.   Medications:     Scheduled Medications: . amiodarone  200 mg Oral Daily  . antiseptic oral rinse  7 mL Mouth Rinse BID  . apixaban  2.5 mg Oral BID  . ferrous sulfate  325 mg Oral BID WC  . furosemide  80 mg Intravenous BID  . levothyroxine  75 mcg Oral QAC breakfast  . metoprolol tartrate  25 mg Oral BID  . miconazole   Topical BID  . pantoprazole  40 mg Oral Daily  . potassium chloride SA  20 mEq Oral BID  . simvastatin  10 mg Oral QHS  . sodium chloride  3 mL Intravenous Q12H    Infusions:    PRN Medications: sodium chloride, acetaminophen, loratadine, LORazepam, nitroGLYCERIN, ondansetron (ZOFRAN) IV, senna, sodium  chloride   Assessment:   Assessment:   1. Acute on chronic diastolic HF with cardiorenal syndrome 2. Chronic AF 3. Symptomatic bradycardia s/p recent PPM 4. Acute on chronic renal failure, stage 4 (baseline Cr 1.5) 5. Groin candidiasis 6. ? OSA   Plan/Discussion:     She is improved with diuresis this am.  I think she has advanced diastolic HF with cardiorenal syndrome and possibly sleep apnea. I think we have her volume status optimized. The goal will be to try to get her oral diuretic regimen stable. Will start with torsemide 20 bid. Would leave in SDU so we can follow CVPs. Change to inpatient status  We may be getting to point where we have to consider goals of care  and palliative options. Will discuss code status with family this admit. She desires DNR.  Previously though to have failed rhythm control with amio but back in NSR. I think this helps her. We have restarted amio at 200 daily. Continue apixaban  Benay Spice 9:18 AM Length of Stay: 2 Advanced Heart Failure Team Pager [phone] (M-F; [place])  Please contact [facility] for night-coverage after hours (4p -7a ) and weekends on [email or web address]

## 2014-09-24 NOTE — Plan of Care (Signed)
Problem: Phase II Progression Outcomes Goal: Begin discharge teaching Outcome: Completed/Met Date Met:  09/24/14

## 2014-09-25 LAB — CARBOXYHEMOGLOBIN
Carboxyhemoglobin: 1.6 % — ABNORMAL HIGH (ref 0.5–1.5)
METHEMOGLOBIN: 0.8 % (ref 0.0–1.5)
O2 Saturation: 75.9 %
Total hemoglobin: 12.7 g/dL (ref 12.0–16.0)

## 2014-09-25 LAB — BASIC METABOLIC PANEL
Anion gap: 11 (ref 5–15)
BUN: 27 mg/dL — ABNORMAL HIGH (ref 6–23)
CHLORIDE: 100 meq/L (ref 96–112)
CO2: 28 meq/L (ref 19–32)
CREATININE: 1.64 mg/dL — AB (ref 0.50–1.10)
Calcium: 8.7 mg/dL (ref 8.4–10.5)
GFR calc Af Amer: 31 mL/min — ABNORMAL LOW (ref >90)
GFR calc non Af Amer: 27 mL/min — ABNORMAL LOW (ref >90)
GLUCOSE: 115 mg/dL — AB (ref 70–99)
Potassium: 3.7 mEq/L (ref 3.7–5.3)
Sodium: 139 mEq/L (ref 137–147)

## 2014-09-25 MED ORDER — FUROSEMIDE 10 MG/ML IJ SOLN
40.0000 mg | Freq: Once | INTRAMUSCULAR | Status: AC
  Administered 2014-09-25: 40 mg via INTRAVENOUS
  Filled 2014-09-25: qty 4

## 2014-09-25 MED ORDER — FUROSEMIDE 10 MG/ML IJ SOLN: 80.0000 mg | Freq: Once | INTRAMUSCULAR | Status: DC

## 2014-09-25 NOTE — Progress Notes (Signed)
CSW (Clinical Education officer, museum) received consult. Per PT and RNCM note, plan is for pt to dc home with family. At this time, pt has no hospital social work needs. Please reconsult should new needs arise.  [place], [place]

## 2014-09-25 NOTE — Plan of Care (Signed)
Problem: Phase I Progression Outcomes Goal: Initial discharge plan identified Outcome: Completed/Met Date Met:  09/25/14 Goal: Other Phase I Outcomes/Goals Outcome: Completed/Met Date Met:  09/25/14  Problem: Phase III Progression Outcomes Goal: Pain controlled on oral analgesia Outcome: Completed/Met Date Met:  09/25/14 Goal: Fluid volume status improved Outcome: Completed/Met Date Met:  09/25/14

## 2014-09-25 NOTE — Progress Notes (Signed)
Advanced Heart Failure Rounding Note   Subjective:    Breathing better. Switched to oral diuretics yesterday. Weight and renal function stable. However CVP 15 (just after walking - was 10 earlier this am)   Objective:   Weight Range:  Vital Signs:   Temp:  [97.2 F (36.2 C)-98 F (36.7 C)] 98 F (36.7 C) (11/12 1154) Pulse Rate:  [55-63] 60 (11/12 1154) Resp:  [10-22] 16 (11/12 1154) BP: (80-100)/(38-58) 100/38 mmHg (11/12 1154) SpO2:  [95 %-100 %] 97 % (11/12 1154) Weight:  [65 kg (143 lb 4.8 oz)] 65 kg (143 lb 4.8 oz) (11/12 0400) Last BM Date: 09/23/14  Weight change: Filed Weights   09/23/14 0450 09/24/14 0404 09/25/14 0400  Weight: 66.3 kg (146 lb 2.6 oz) 65 kg (143 lb 4.8 oz) 65 kg (143 lb 4.8 oz)    Intake/Output:   Intake/Output Summary (Last 24 hours) at 09/25/14 1206 Last data filed at 09/25/14 0600  Gross per 24 hour  Intake    460 ml  Output   2075 ml  Net  -1615 ml     Physical Exam: General: Elderly Chronically ill appearing. Sitting in chair No resp difficulty. HEENT: normal Neck: supple. RIJ TLC. JVP jaw Carotids 2+ bilaterally; no bruits. No lymphadenopathy or thryomegaly appreciated. Cor: PMI normal.Regular . No rubs, gallops or murmurs. Lungs: clear Abdomen: Obese, soft, nontender, nondistended. No hepatosplenomegaly. No bruits or masses. Good bowel sounds. Groin -  candidial rash with skin breakdown Extremities: no cyanosis, clubbing, rash, ted hose on. no edema + varicose veins Neuro: alert & orientedx3, cranial nerves grossly intact. Moves all 4 extremities w/o difficulty. Affect pleasant.  Telemetry: NSR 50-60s  Labs: Basic Metabolic Panel:  Recent Labs Lab 09/18/14 1317 09/22/14 1119 09/23/14 1100 09/24/14 0445 09/25/14 0520  NA 136 137 141 143 139  K 4.5 4.0 4.1 4.3 3.7  CL 104 100 105 105 100  CO2 24 21 25 25 28   GLUCOSE 125* 112* 125* 124* 115*  BUN 54* 39* 31* 27* 27*  CREATININE 2.9* 1.84* 1.59* 1.60* 1.64*  CALCIUM 9.8  9.7 9.1 9.3 8.7    Liver Function Tests: No results for input(s): AST, ALT, ALKPHOS, BILITOT, PROT, ALBUMIN in the last 168 hours. No results for input(s): LIPASE, AMYLASE in the last 168 hours. No results for input(s): AMMONIA in the last 168 hours.  CBC:  Recent Labs Lab 09/22/14 1119  WBC 5.8  HGB 13.7  HCT 43.0  MCV 90.7  PLT 112*    Cardiac Enzymes: No results for input(s): CKTOTAL, CKMB, CKMBINDEX, TROPONINI in the last 168 hours.  BNP: BNP (last 3 results)  Recent Labs  07/18/14 0921 08/27/14 1147 09/18/14 1317  PROBNP 2626.0* 914.0* 1874.0*     Other results:    Imaging: No results found.   Medications:     Scheduled Medications: . amiodarone  200 mg Oral Daily  . antiseptic oral rinse  7 mL Mouth Rinse BID  . apixaban  2.5 mg Oral BID  . ferrous sulfate  325 mg Oral BID WC  . levothyroxine  75 mcg Oral QAC breakfast  . metoprolol tartrate  25 mg Oral BID  . miconazole   Topical BID  . pantoprazole  40 mg Oral Daily  . potassium chloride SA  20 mEq Oral BID  . simvastatin  20 mg Oral QHS  . sodium chloride  3 mL Intravenous Q12H  . torsemide  20 mg Oral BID    Infusions:  PRN Medications: sodium chloride, acetaminophen, loratadine, LORazepam, nitroGLYCERIN, ondansetron (ZOFRAN) IV, senna, sodium chloride   Assessment:      1. Acute on chronic diastolic HF with cardiorenal syndrome 2. Chronic AF 3. Symptomatic bradycardia s/p recent PPM 4. Acute on chronic renal failure, stage 4 (baseline Cr 1.5) 5. Groin candidiasis 6. ? OSA   Plan/Discussion:    She is improved with diuresis this am but CVP still up a bit.  I think she has advanced diastolic HF with cardiorenal syndrome and possibly sleep apnea. I think we can pull a bit more fluid off today. She is stable on torsemide 20 bid. Would give one more dose IV lasix. Would leave in SDU so we can follow CVPs. Home diuretic regimen will likely be torsemide 20 bid with metolazone as  needed for weight 144 or greater.   Possibly to [facility]. Spoke with her and her family yesterday about advanced directives and she is DNR.    Previously though to have failed rhythm control with amio but back in NSR. I think this helps her. We have restarted amio at 200 daily. Continue apixaban  Quillian Quince Bensimhon,MD 12:06 PM Length of Stay: 3 Advanced Heart Failure Team Pager [phone] (M-F; [place])  Please contact [facility] for night-coverage after hours (4p -7a ) and weekends on [email or web address]

## 2014-09-25 NOTE — Progress Notes (Signed)
Physical Therapy Treatment Patient Details Name: [name] MRN: [id number] DOB: [birth year] Today's Date: October 13, 2014    History of Present Illness Pt adm with acute on chronic diastolic HF with cardiorenal syndrome. PMH- pacer, a-fib, HTN    PT Comments    Pt making steady progress.  Follow Up Recommendations  Home health PT;Supervision for mobility/OOB     Equipment Recommendations  None recommended by PT    Recommendations for Other Services       Precautions / Restrictions Precautions Precautions: Fall    Mobility  Bed Mobility               General bed mobility comments: Pt sitting in chair.  Transfers Overall transfer level: Needs assistance Equipment used: 4-wheeled walker Transfers: Sit to/from Stand Sit to Stand: Min guard         General transfer comment: Incr time. Assist for safety.  Ambulation/Gait Ambulation/Gait assistance: Min guard Ambulation Distance (Feet): 290 Feet Assistive device: 4-wheeled walker Gait Pattern/deviations: Step-through pattern;Decreased step length - right;Decreased step length - left;Shuffle;Trunk flexed   Gait velocity interpretation: Below normal speed for age/gender General Gait Details: Pt leaning on rollator in flexed posture due to kyphosis.   Stairs            Wheelchair Mobility    Modified Rankin (Stroke Patients Only)       Balance Overall balance assessment: Needs assistance Sitting-balance support: No upper extremity supported Sitting balance-Leahy Scale: Good     Standing balance support: No upper extremity supported Standing balance-Leahy Scale: Fair                      Cognition Arousal/Alertness: Awake/alert Behavior During Therapy: WFL for tasks assessed/performed Overall Cognitive Status: Within Functional Limits for tasks assessed                      Exercises      General Comments        Pertinent Vitals/Pain Pain Assessment: No/denies  pain    Home Living                      Prior Function            PT Goals (current goals can now be found in the care plan section) Progress towards PT goals: Progressing toward goals    Frequency  Min 3X/week    PT Plan Current plan remains appropriate    Co-evaluation             End of Session Equipment Utilized During Treatment: Gait belt;Oxygen Activity Tolerance: Patient tolerated treatment well Patient left: in chair;with call bell/phone within reach     Time: 1153-1219 PT Time Calculation (min) (ACUTE ONLY): 26 min  Charges:  $Gait Training: 23-37 mins                    G Codes:      [name] 13-Oct-2014, 12:36 PM  [facility] PT [phone]

## 2014-09-25 NOTE — Progress Notes (Signed)
Physical Therapy Treatment Patient Details Name: [name] MRN: [id number] DOB: 09/28/1925 Today's Date: Sep 29, 2014    History of Present Illness Pt adm with acute on chronic diastolic HF with cardiorenal syndrome. PMH- pacer, a-fib, HTN    PT Comments    Steady progress. Very motivated.  Follow Up Recommendations  Home health PT;Supervision for mobility/OOB     Equipment Recommendations  None recommended by PT    Recommendations for Other Services       Precautions / Restrictions Precautions Precautions: Fall    Mobility  Bed Mobility               General bed mobility comments: Pt sitting in chair.  Transfers Overall transfer level: Needs assistance Equipment used: 4-wheeled walker Transfers: Sit to/from [facility] Sit to Stand: Min guard Stand pivot transfers: Min guard (for chair to bsc to chair.)       General transfer comment: Incr time. Assist for safety.  Ambulation/Gait Ambulation/Gait assistance: Min guard Ambulation Distance (Feet): 170 Feet Assistive device: 4-wheeled walker Gait Pattern/deviations: Step-through pattern;Decreased step length - right;Decreased step length - left;Shuffle;Trunk flexed   Gait velocity interpretation: Below normal speed for age/gender General Gait Details: Pt leaning on rollator in flexed posture due to kyphosis.   Stairs            Wheelchair Mobility    Modified Rankin (Stroke Patients Only)       Balance Overall balance assessment: Needs assistance Sitting-balance support: No upper extremity supported Sitting balance-Leahy Scale: Good     Standing balance support: No upper extremity supported Standing balance-Leahy Scale: Fair                      Cognition Arousal/Alertness: Awake/alert Behavior During Therapy: WFL for tasks assessed/performed Overall Cognitive Status: Within Functional Limits for tasks assessed                      Exercises       General Comments        Pertinent Vitals/Pain Pain Assessment: No/denies pain    Home Living                      Prior Function            PT Goals (current goals can now be found in the care plan section) Progress towards PT goals: Progressing toward goals    Frequency  Min 3X/week    PT Plan Current plan remains appropriate    Co-evaluation             End of Session Equipment Utilized During Treatment: Gait belt;Oxygen Activity Tolerance: Patient tolerated treatment well Patient left: in chair;with call bell/phone within reach     Time: 1320-1336 PT Time Calculation (min) (ACUTE ONLY): 16 min  Charges:  $Gait Training: 8-22 mins                    G Codes:      Analyn Matusek September 29, 2014, 2:51 PM  [employer] PT [phone]

## 2014-09-26 LAB — BASIC METABOLIC PANEL
Anion gap: 12 (ref 5–15)
BUN: 24 mg/dL — ABNORMAL HIGH (ref 6–23)
CO2: 28 mEq/L (ref 19–32)
Calcium: 8.6 mg/dL (ref 8.4–10.5)
Chloride: 101 mEq/L (ref 96–112)
Creatinine, Ser: 1.61 mg/dL — ABNORMAL HIGH (ref 0.50–1.10)
GFR calc Af Amer: 32 mL/min — ABNORMAL LOW (ref >90)
GFR calc non Af Amer: 28 mL/min — ABNORMAL LOW (ref >90)
Glucose, Bld: 168 mg/dL — ABNORMAL HIGH (ref 70–99)
Potassium: 3.7 mEq/L (ref 3.7–5.3)
Sodium: 141 mEq/L (ref 137–147)

## 2014-09-26 LAB — CARBOXYHEMOGLOBIN
Carboxyhemoglobin: 1.6 % — ABNORMAL HIGH (ref 0.5–1.5)
Methemoglobin: 0.9 % (ref 0.0–1.5)
O2 Saturation: 75.9 %
Total hemoglobin: 11.8 g/dL — ABNORMAL LOW (ref 12.0–16.0)

## 2014-09-26 MED ORDER — TORSEMIDE 20 MG PO TABS: 20.0000 mg | ORAL_TABLET | Freq: Two times a day (BID) | ORAL | Status: DC

## 2014-09-26 MED ORDER — ACETAMINOPHEN 325 MG PO TABS: 650.0000 mg | ORAL_TABLET | ORAL | Status: DC | PRN

## 2014-09-26 MED ORDER — SENNA 8.6 MG PO TABS: 2.0000 | ORAL_TABLET | Freq: Every day | ORAL | Status: DC | PRN

## 2014-09-26 MED ORDER — POTASSIUM CHLORIDE CRYS ER 20 MEQ PO TBCR: 20.0000 meq | EXTENDED_RELEASE_TABLET | Freq: Two times a day (BID) | ORAL | Status: DC

## 2014-09-26 MED ORDER — MICONAZOLE NITRATE 2 % EX CREA: TOPICAL_CREAM | Freq: Two times a day (BID) | CUTANEOUS | Status: DC

## 2014-09-26 MED ORDER — AMIODARONE HCL 200 MG PO TABS: 200.0000 mg | ORAL_TABLET | Freq: Every day | ORAL | Status: DC

## 2014-09-26 MED ORDER — METOLAZONE 2.5 MG PO TABS: 2.5000 mg | ORAL_TABLET | Freq: Every day | ORAL | Status: DC | PRN

## 2014-09-26 NOTE — Progress Notes (Signed)
CSW (Clinical Education officer, museum) notified by [facility] that they will be unable to offer a bed. They feel pt is more appropriate for home health services. CSW called pt daughter Joseph Art and updated. CSW provided her with alternative bed offers, but did reinforce that this will be pending insurance authorization. CSW also paged MD to notify of potential for insurance denial. RNCM notified that pt may need to dc home with home health.   CSW received phone call from pt daughter Mliss Sax asking for clarification. CSW explained current situation and that we are waiting insurance authorization.  [place], [place]

## 2014-09-26 NOTE — Plan of Care (Signed)
Problem: Consults Goal: Heart Failure Patient Education (See Patient Education module for education specifics.)  Outcome: Completed/Met Date Met:  09/26/14 Goal: Skin Care Protocol Initiated - if Braden Score 18 or less If consults are not indicated, leave blank or document N/A  Outcome: Completed/Met Date Met:  09/26/14 Goal: Tobacco Cessation referral if indicated Outcome: Not Applicable Date Met:  74/82/70 Goal: Nutrition Consult-if indicated Outcome: Not Applicable Date Met:  78/67/54 Goal: Diabetes Guidelines if Diabetic/Glucose > 140 If diabetic or lab glucose is > 140 mg/dl - Initiate Diabetes/Hyperglycemia Guidelines & Document Interventions  Outcome: Completed/Met Date Met:  09/26/14

## 2014-09-26 NOTE — Progress Notes (Addendum)
Physical Therapy Treatment Patient Details Name: NADALYN DERINGER MRN: [id number] DOB: 02-Oct-1925 Today's Date: September 27, 2014    History of Present Illness Pt adm with acute on chronic diastolic HF with cardiorenal syndrome. PMH- pacer, a-fib, HTN    PT Comments    Pt continues with slow progress but fatigues with amb and will have difficult time maintaining enough activity tolerance to perform basic mobility at home. Pt now agreeable to ST-SNF.  Follow Up Recommendations  SNF     Equipment Recommendations  None recommended by PT    Recommendations for Other Services       Precautions / Restrictions Precautions Precautions: Fall Restrictions Weight Bearing Restrictions: No    Mobility  Bed Mobility               General bed mobility comments: Pt sitting in chair.  Transfers Overall transfer level: Needs assistance Equipment used: 4-wheeled walker Transfers: Sit to/from Stand Sit to Stand: Min assist         General transfer comment: Assist to bring hips up.  Ambulation/Gait Ambulation/Gait assistance: Min assist Ambulation Distance (Feet): 350 Feet Assistive device: 4-wheeled walker Gait Pattern/deviations: Step-through pattern;Decreased step length - right;Decreased step length - left;Shuffle;Trunk flexed   Gait velocity interpretation: Below normal speed for age/gender General Gait Details: Pt leaning on rollator in flexed posture due to kyphosis. Assist for balance.   Stairs            Wheelchair Mobility    Modified Rankin (Stroke Patients Only)       Balance   Sitting-balance support: No upper extremity supported;Feet supported Sitting balance-Leahy Scale: Good     Standing balance support: No upper extremity supported;During functional activity Standing balance-Leahy Scale: Fair                      Cognition Arousal/Alertness: Awake/alert Behavior During Therapy: WFL for tasks assessed/performed Overall Cognitive  Status: Within Functional Limits for tasks assessed                      Exercises      General Comments        Pertinent Vitals/Pain Pain Assessment: No/denies pain    Home Living                      Prior Function            PT Goals (current goals can now be found in the care plan section) Progress towards PT goals: Progressing toward goals    Frequency  Min 3X/week    PT Plan Discharge plan needs to be updated    Co-evaluation             End of Session Equipment Utilized During Treatment: Gait belt;Oxygen Activity Tolerance: Patient tolerated treatment well Patient left: in chair;with call bell/phone within reach     Time: 1019-1045 PT Time Calculation (min) (ACUTE ONLY): 26 min  Charges:  $Gait Training: 23-37 mins                    G Codes:      Zekiah Coen 09/27/2014, 10:59 AM  [facility] PT [phone]

## 2014-09-26 NOTE — Plan of Care (Signed)
Problem: Discharge Progression Outcomes Goal: Barriers To Progression Addressed/Resolved Outcome: Completed/Met Date Met:  09/26/14 Goal: Able to perform self care activities Outcome: Completed/Met Date Met:  09/26/14 Goal: Discharge plan in place and appropriate Outcome: Completed/Met Date Met:  09/26/14 Goal: Pain controlled with appropriate interventions Outcome: Completed/Met Date Met:  09/26/14 Goal: Hemodynamically stable Outcome: Completed/Met Date Met:  38/18/29 Goal: Complications resolved/controlled Outcome: Completed/Met Date Met:  09/26/14 Goal: Tolerating diet Outcome: Completed/Met Date Met:  09/26/14 Goal: Activity appropriate for discharge plan Outcome: Completed/Met Date Met:  09/26/14 Goal: Other Discharge Outcomes/Goals Outcome: Not Applicable Date Met:  93/71/69

## 2014-09-26 NOTE — Progress Notes (Signed)
CSW (Clinical Education officer, museum) awaiting updated PT note in order to initiate SNF authorization process through pt insurance. Based on current available notes pt would likely not be approved, but per [facility] conversation with PT, recommendation is being changed to reflect pt decline in participation today.  [place], [place]

## 2014-09-26 NOTE — Clinical Social Work Psychosocial (Signed)
     Clinical Social Work Department BRIEF PSYCHOSOCIAL ASSESSMENT 09/26/2014  Patient:  [name], [name]     Account Number:  [id number]     [place] date:  09/22/2014  Clinical Social Worker:  Adair Laundry  Date/Time:  09/26/2014 11:17 AM  Referred by:  Physician  Date Referred:  09/26/2014 Referred for  SNF Placement   Other Referral:   Interview type:  Patient Other interview type:    PSYCHOSOCIAL DATA Living Status:  ALONE Admitted from facility:   Level of care:   Primary support name:  Renee Hooks Primary support relationship to patient:  CHILD, ADULT Degree of support available:   Pt has good support    CURRENT CONCERNS Current Concerns  Post-Acute Placement   Other Concerns:    SOCIAL WORK ASSESSMENT / PLAN CSW visited pt room to discuss SNF. Pt informed CSW this is what she would want. Pt expressed that she does not feel comfortable discharging home and would feel better if she could get some rehab first. Pt lives alone but her daughters do assist as needed. Pt informed CSW her daughter Joseph Art has been to [facility] before and this is where she would want to go. Pt expressed she would be comfortable at this facility because she knows they provided good care to her daughter. CSW explained SNF referral process and pt was agreeable to CSW sending referral to all [facility] as a backup. Pt asked that CSW also call her daughter to update. CSW called pt daughter Joseph Art who confirmed plan. Renee did inform CSW that pt daughter Pamala Hurry is pt HCPOA but all siblings are agreeable to pt needing rehab. CSW did not call HCPOA since pt is Ox4 and able to make her own decisions. Renee however agreed to call siblings and keep updated. CSW did notify Joseph Art that plan will be pending insurance authorization. Renee understanding of this. During conversation with pt daughter, CSW notified by [facility] that they can offer a bed. CSW updated Renee on bed offer and she  confirmed they wanted to accept. Per Joseph Art, family would want to be with pt when she completes paperwork with [facility]. CSW notified Joseph Art that CSW would relay this request to [employer]. CSW did notify Joseph Art of potential for dc today.    CSW called pt nurse and notified that pt does have a bed at [facility] today pending insurance authorization. Clinicals have been faxed to insurance for authorization already this morning so decision should be received later this afternoon. Pt nurse to relay this MD and request FL2 on chart be signed as well as [employer] out of facility DNR form.   Assessment/plan status:  Psychosocial Support/Ongoing Assessment of Needs Other assessment/ plan:   Information/referral to community resources:   SNF list declined    PATIENTS/FAMILYS RESPONSE TO PLAN OF CARE: Pt and pt family pleasant and coopeartive. They are agreeable to SNF at dc.      [place], [place]

## 2014-09-26 NOTE — Plan of Care (Signed)
Problem: Phase III Progression Outcomes Goal: Other Phase III Outcomes/Goals Outcome: Not Applicable Date Met:  57/84/69

## 2014-09-26 NOTE — Plan of Care (Signed)
Problem: Discharge Progression Outcomes Goal: If EF < 40% ACEI/ARB addressed at discharge Outcome: Not Applicable Date Met:  08/67/61

## 2014-09-26 NOTE — Progress Notes (Signed)
Advanced Heart Failure Rounding Note   Subjective:    Doing well. On oral diuretics but given one dose IV lasix yesterday as CVP 15. Walk distance improving but still limited. Denies dyspnea. Weight down to 141. Cr stable at 1.6. CVP 8  Wants to go to [facility].    Objective:   Weight Range:  Vital Signs:   Temp:  [97.2 F (36.2 C)-98 F (36.7 C)] 97.3 F (36.3 C) (11/13 0332) Pulse Rate:  [56-62] 56 (11/13 0603) Resp:  [13-19] 19 (11/13 0603) BP: (80-100)/(33-51) 92/51 mmHg (11/13 0603) SpO2:  [95 %-100 %] 99 % (11/13 0603) Weight:  [64.3 kg (141 lb 12.1 oz)] 64.3 kg (141 lb 12.1 oz) (11/13 0500) Last BM Date: 09/25/14  Weight change: Filed Weights   09/24/14 0404 09/25/14 0400 09/26/14 0500  Weight: 65 kg (143 lb 4.8 oz) 65 kg (143 lb 4.8 oz) 64.3 kg (141 lb 12.1 oz)    Intake/Output:   Intake/Output Summary (Last 24 hours) at 09/26/14 9675 Last data filed at 09/26/14 0600  Gross per 24 hour  Intake    600 ml  Output   2575 ml  Net  -1975 ml     Physical Exam: General: Elderly Chronically ill appearing. Sitting in chair No resp difficulty. HEENT: normal Neck: supple. RIJ TLC. JVP jaw Carotids 2+ bilaterally; no bruits. No lymphadenopathy or thryomegaly appreciated. Cor: PMI normal.Regular . No rubs, gallops or murmurs. Lungs: clear Abdomen: Obese, soft, nontender, nondistended. No hepatosplenomegaly. No bruits or masses. Good bowel sounds. Groin -  candidial rash with skin breakdown Extremities: no cyanosis, clubbing, rash, ted hose on. no edema + varicose veins Neuro: alert & orientedx3, cranial nerves grossly intact. Moves all 4 extremities w/o difficulty. Affect pleasant.  Telemetry: NSR 50-60s  Labs: Basic Metabolic Panel:  Recent Labs Lab 09/22/14 1119 09/23/14 1100 09/24/14 0445 09/25/14 0520 09/26/14 0453  NA 137 141 143 139 141  K 4.0 4.1 4.3 3.7 3.7  CL 100 105 105 100 101  CO2 21 25 25 28 28   GLUCOSE 112* 125* 124* 115* 168*  BUN 39*  31* 27* 27* 24*  CREATININE 1.84* 1.59* 1.60* 1.64* 1.61*  CALCIUM 9.7 9.1 9.3 8.7 8.6    Liver Function Tests: No results for input(s): AST, ALT, ALKPHOS, BILITOT, PROT, ALBUMIN in the last 168 hours. No results for input(s): LIPASE, AMYLASE in the last 168 hours. No results for input(s): AMMONIA in the last 168 hours.  CBC:  Recent Labs Lab 09/22/14 1119  WBC 5.8  HGB 13.7  HCT 43.0  MCV 90.7  PLT 112*    Cardiac Enzymes: No results for input(s): CKTOTAL, CKMB, CKMBINDEX, TROPONINI in the last 168 hours.  BNP: BNP (last 3 results)  Recent Labs  07/18/14 0921 08/27/14 1147 09/18/14 1317  PROBNP 2626.0* 914.0* 1874.0*     Other results:    Imaging: No results found.   Medications:     Scheduled Medications: . amiodarone  200 mg Oral Daily  . antiseptic oral rinse  7 mL Mouth Rinse BID  . apixaban  2.5 mg Oral BID  . ferrous sulfate  325 mg Oral BID WC  . levothyroxine  75 mcg Oral QAC breakfast  . metoprolol tartrate  25 mg Oral BID  . miconazole   Topical BID  . pantoprazole  40 mg Oral Daily  . potassium chloride SA  20 mEq Oral BID  . simvastatin  20 mg Oral QHS  . sodium chloride  3 mL  Intravenous Q12H  . torsemide  20 mg Oral BID    Infusions:    PRN Medications: sodium chloride, acetaminophen, loratadine, LORazepam, nitroGLYCERIN, ondansetron (ZOFRAN) IV, senna, sodium chloride   Assessment:      1. Acute on chronic diastolic HF with cardiorenal syndrome 2. Chronic AF 3. Symptomatic bradycardia s/p recent PPM 4. Acute on chronic renal failure, stage 4 (baseline Cr 1.5) 5. Groin candidiasis 6. ? OSA   Plan/Discussion:    She has advanced diastolic HF with cardiorenal syndrome and possibly sleep apnea. Weight is down almost 10 pounds. She is stable on torsemide 20 bid. This may be as good as we can get her. Home diuretic regimen will be torsemide 20 bid with metolazone as needed for weight 143 or greater.   Possibly to [facility] today. Spoke with her and her family about advanced directives and she is DNR.    Previously though to have failed rhythm control with amio but back in NSR. I think this helps her. We have restarted amio at 200 daily. Continue apixaban  Benay Spice 6:24 AM Length of Stay: 4 Advanced Heart Failure Team Pager [phone] (M-F; 7a - 4p)  Please contact [facility] for night-coverage after hours (4p -7a ) and weekends on [email or web address]

## 2014-09-26 NOTE — Discharge Summary (Signed)
Advanced Heart Failure Team  Discharge Summary   Patient ID: [name] MRN: [id number], DOB/AGE: 1925/07/24 80 y.o. Admit date: 09/22/2014 D/C date:     09/26/2014   Dr. Harrington Challenger  Primary Discharge Diagnoses:  1) A/C diastolic HF with cardiorenal syndrome  Secondary Discharge Diagnoses:  1) Paraxysmal Afib 2) Symptomatic bradycardia - s/p recent PPM 3) CKD stage III 4) Groin candidiasis 5) ?OSA 6) A/C renal failure, stage IV - baseline Cr 1.5 7) Pt is DNR   Hospital Course:  ANNELLA PROWELL is a 80 y.o. female with the history of nonobstructive CAD by cardiac catheterization in 01/2014, persistent atrial fibrillation, diastolic CHF, HTN, HL, anemia, and CKD. Patient was admitted 04/2014 x2 with acute on chronic diastolic CHF. She underwent cardioversion with restoration of NSR. This resulted in profound bradycardia and hypotension requiring pacemaker implantation. Pacemaker implant was complicated by acute blood loss anemia requiring transfusion with PRBCs.   Has been followed in the HF clinic closely and has had difficulty managing volume status and renal function had been trending up so she was set up for planned RHC on 09/22/14 to rule out low output. RHC showed significant V waves in wedge tracing as well as elevated R side pressures with signifanct peripheral edema so she was admitted for diuresis. She diuresed and was feeling better.  Home diuretic regimen will likely be torsemide 20 bid with metolazone as needed for weight 144 or greater.    During the stay she was in [place] so amiodarone was resumed, previously though she had failed rhythm control.  She is on apixaban for anticoagulation.   Pt was made DNR during hospitalization.   Pt was slowly ambulating with PT prior to discharge.  Plan to go [facility]. She needs oxygen at 2 L [place]   Discharge Weight Range: 141lbs 12 oz   (admit wt was 150.2) Discharge Vitals: Blood pressure 77/56, pulse 72, temperature 97.5 F  (36.4 C), temperature source Oral, resp. rate 17, height 4' 10.5" (1.486 m), weight 141 lb 12.1 oz (64.3 kg), SpO2 99 %.  Labs: Lab Results  Component Value Date   WBC 5.8 09/22/2014   HGB 13.7 09/22/2014   HCT 43.0 09/22/2014   MCV 90.7 09/22/2014   PLT 112* 09/22/2014     Recent Labs Lab 09/26/14 0453  NA 141  K 3.7  CL 101  CO2 28  BUN 24*  CREATININE 1.61*  CALCIUM 8.6  GLUCOSE 168*   Lab Results  Component Value Date   CHOL 141 04/14/2014   HDL 89 04/14/2014   LDLCALC 41 04/14/2014   TRIG 56 04/14/2014   BNP (last 3 results)  Recent Labs  07/18/14 0921 08/27/14 1147 09/18/14 1317  PROBNP 2626.0* 914.0* 1874.0*    Diagnostic Studies/Procedures   RHC 09/22/14: RA = 11 RV = 49/6/10 PA = 49/22 (34) PCW = 18 (v = 25) Fick cardiac output/index = 6.4/4.0 PVR = 2.5 WU FA sat = 95% PA sat = 72%,74%  Discharge Medications     Medication List    STOP taking these medications        furosemide 40 MG tablet  Commonly known as:  LASIX      TAKE these medications        acetaminophen 325 MG tablet  Commonly known as:  TYLENOL  Take 2 tablets (650 mg total) by mouth every 4 (four) hours as needed for headache or mild pain.     amiodarone 200 MG tablet  Commonly known as:  PACERONE  Take 1 tablet (200 mg total) by mouth daily.     apixaban 2.5 MG Tabs tablet  Commonly known as:  ELIQUIS  Take 1 tablet (2.5 mg total) by mouth 2 (two) times daily.     ferrous sulfate 325 (65 FE) MG tablet  Take 1 tablet (325 mg total) by mouth 2 (two) times daily with a meal.     levothyroxine 75 MCG tablet  Commonly known as:  SYNTHROID, LEVOTHROID  Take 1 tablet (75 mcg total) by mouth daily.     loratadine 10 MG tablet  Commonly known as:  CLARITIN  Take 10 mg by mouth daily as needed for allergies.     LORazepam 0.5 MG tablet  Commonly known as:  ATIVAN  Take 0.5-1 tablets (0.25-0.5 mg total) by mouth every 8 (eight) hours as needed for anxiety.      metolazone 2.5 MG tablet  Commonly known as:  ZAROXOLYN  Take 1 tablet (2.5 mg total) by mouth daily as needed. Take only if weight in AM is greater than 144 pounds.     metoprolol tartrate 25 MG tablet  Commonly known as:  LOPRESSOR  Take 1 tablet (25 mg total) by mouth 2 (two) times daily.     miconazole 2 % cream  Commonly known as:  MICOTIN  Apply topically 2 (two) times daily.     nitroGLYCERIN 0.4 MG SL tablet  Commonly known as:  NITROSTAT  Place 1 tablet (0.4 mg total) under the tongue every 5 (five) minutes as needed. For chest pain     pantoprazole 40 MG tablet  Commonly known as:  PROTONIX  Take 1 tablet (40 mg total) by mouth daily.     potassium chloride SA 20 MEQ tablet  Commonly known as:  KLOR-CON M20  Take 1 tablet (20 mEq total) by mouth 2 (two) times daily.     senna 8.6 MG Tabs tablet  Commonly known as:  SENOKOT  Take 2 tablets (17.2 mg total) by mouth daily as needed for mild constipation.     simvastatin 10 MG tablet  Commonly known as:  ZOCOR  Take 1 tablet (10 mg total) by mouth at bedtime.     torsemide 20 MG tablet  Commonly known as:  DEMADEX  Take 1 tablet (20 mg total) by mouth 2 (two) times daily.        Disposition   The patient will be discharged in stable condition to SNF.  Follow-up Information    Follow up with [facility] On 10/06/2014.   Specialty:  Cardiology   Why:  @ 10;15 am; [facility] code [employer] information:   [address] [id number] [place] [phone]        Duration of Discharge Encounter: Greater than 35 minutes   Signed, INGOLD,LAURA R  09/26/2014, 4:10 PM

## 2014-09-26 NOTE — Plan of Care (Signed)
Problem: Phase II Progression Outcomes Goal: Discharge plan established Outcome: Completed/Met Date Met:  09/26/14 Goal: Case manager referral Outcome: Completed/Met Date Met:  09/26/14 Goal: Other Phase II Outcomes/Goals Outcome: Not Applicable Date Met:  98/92/11  Problem: Phase III Progression Outcomes Goal: Activity at appropriate level-compared to baseline (UP IN CHAIR FOR HEMODIALYSIS)  Outcome: Completed/Met Date Met:  09/26/14 Goal: Dyspnea controlled with activity Outcome: Completed/Met Date Met:  09/26/14 Goal: Discharge plan remains appropriate-arrangements made Outcome: Completed/Met Date Met:  09/26/14

## 2014-09-26 NOTE — Clinical Social Work Placement (Signed)
     Clinical Social Work Department CLINICAL SOCIAL WORK PLACEMENT NOTE 09/26/2014  Patient:  KRYSTALLE, PILKINGTON  Account Number:  [id number] [place] date:  09/22/2014  Clinical Social Worker:  Adair Laundry  Date/time:  09/26/2014 11:33 AM  Clinical Social Work is seeking post-discharge placement for this patient at the following level of care:   [facility]   (*CSW will update this form in Epic as items are completed)   09/26/2014  Patient/family provided with [place] Department of Clinical Social Works list of facilities offering this level of care within the geographic area requested by the patient (or if unable, by the patients family).  09/26/2014  Patient/family informed of their freedom to choose among providers that offer the needed level of care, that participate in Medicare, Medicaid or managed care program needed by the patient, have an available bed and are willing to accept the patient.  09/26/2014  Patient/family informed of [facility] ownership interest in [facility], as well as of the fact that they are under no obligation to receive care at this facility.  PASARR submitted to EDS on existing PASARR number received on   FL2 transmitted to all facilities in geographic area requested by pt/family on  09/26/2014 FL2 transmitted to all facilities within larger geographic area on   Patient informed that his/her managed care company has contracts with or will negotiate with  certain facilities, including the following:     Patient/family informed of bed offers received:  09/26/2014 Patient chooses bed at [facility] Physician recommends and patient chooses bed at    Patient to be transferred to [facility] on   Patient to be transferred to facility by [facility] Patient and family notified of transfer on 09/26/2014 Name of family member notified:  Oren Binet  The following physician request were entered in Epic: Physician Request  Please  sign FL2.    Additional CommentsBerton Mount, [place]

## 2014-09-26 NOTE — Progress Notes (Signed)
Pt left unit via EMS to [facility]. Report given to [facility]. All IV access removed and dressed with gauze. Discharge instructions given. Belongings sent with pt.

## 2014-09-26 NOTE — Discharge Instructions (Signed)
Weigh daily Call [phone] - heart failure clinic- if weight climbs more than 3 pounds in a day or 5 pounds in a week. No salt to very little salt in your diet.  No more than 2000 mg in a day. Call if increased shortness of breath or increased swelling.  Follow up as instructed.  Metolazone only if weight is greater than 144 pounds that day.

## 2014-09-29 ENCOUNTER — Encounter: Payer: Self-pay | Admitting: Adult Health

## 2014-09-29 ENCOUNTER — Non-Acute Institutional Stay (SKILLED_NURSING_FACILITY): Payer: Medicare Other | Admitting: Adult Health

## 2014-09-29 DIAGNOSIS — D509 Iron deficiency anemia, unspecified: Secondary | ICD-10-CM

## 2014-09-29 DIAGNOSIS — E039 Hypothyroidism, unspecified: Secondary | ICD-10-CM

## 2014-09-29 DIAGNOSIS — N183 Chronic kidney disease, stage 3 unspecified: Secondary | ICD-10-CM

## 2014-09-29 DIAGNOSIS — E876 Hypokalemia: Secondary | ICD-10-CM

## 2014-09-29 DIAGNOSIS — R531 Weakness: Secondary | ICD-10-CM

## 2014-09-29 DIAGNOSIS — E785 Hyperlipidemia, unspecified: Secondary | ICD-10-CM

## 2014-09-29 DIAGNOSIS — I5032 Chronic diastolic (congestive) heart failure: Secondary | ICD-10-CM

## 2014-09-29 DIAGNOSIS — K219 Gastro-esophageal reflux disease without esophagitis: Secondary | ICD-10-CM

## 2014-09-29 DIAGNOSIS — J3089 Other allergic rhinitis: Secondary | ICD-10-CM

## 2014-09-29 DIAGNOSIS — I480 Paroxysmal atrial fibrillation: Secondary | ICD-10-CM

## 2014-09-29 DIAGNOSIS — F419 Anxiety disorder, unspecified: Secondary | ICD-10-CM

## 2014-10-02 ENCOUNTER — Non-Acute Institutional Stay (SKILLED_NURSING_FACILITY): Payer: Medicare Other | Admitting: Internal Medicine

## 2014-10-02 ENCOUNTER — Encounter: Payer: Self-pay | Admitting: Internal Medicine

## 2014-10-02 DIAGNOSIS — J309 Allergic rhinitis, unspecified: Secondary | ICD-10-CM | POA: Insufficient documentation

## 2014-10-02 DIAGNOSIS — K5900 Constipation, unspecified: Secondary | ICD-10-CM

## 2014-10-02 DIAGNOSIS — I482 Chronic atrial fibrillation, unspecified: Secondary | ICD-10-CM

## 2014-10-02 DIAGNOSIS — R531 Weakness: Secondary | ICD-10-CM

## 2014-10-02 DIAGNOSIS — E785 Hyperlipidemia, unspecified: Secondary | ICD-10-CM

## 2014-10-02 DIAGNOSIS — N183 Chronic kidney disease, stage 3 unspecified: Secondary | ICD-10-CM

## 2014-10-02 DIAGNOSIS — E038 Other specified hypothyroidism: Secondary | ICD-10-CM

## 2014-10-02 DIAGNOSIS — K219 Gastro-esophageal reflux disease without esophagitis: Secondary | ICD-10-CM

## 2014-10-02 DIAGNOSIS — D509 Iron deficiency anemia, unspecified: Secondary | ICD-10-CM

## 2014-10-02 DIAGNOSIS — I5033 Acute on chronic diastolic (congestive) heart failure: Secondary | ICD-10-CM

## 2014-10-02 DIAGNOSIS — F419 Anxiety disorder, unspecified: Secondary | ICD-10-CM | POA: Insufficient documentation

## 2014-10-02 NOTE — Progress Notes (Signed)
Patient ID: [name], female   DOB: 02-27-1925, 80 y.o.   MRN: [id number]   09/29/14  Facility:  Nursing Home Location:  [place] Room Number: 205-2 LEVEL OF CARE:  SNF (31)   Chief Complaint  Patient presents with  . Hospitalization Follow-up    Generalized weakness, GERD, Diastolic Heart Failure, Atrial Fibrillation, CKD, stage III, Anemia, Hypothyroidism, Allergic Rhinitis, Anxiety, Hypokalemia and Hyperlipidemia    HISTORY OF PRESENT ILLNESS:  This is an 80 year old female who has been admitted to [facility] on 09/26/14 from [facility] with Acute on chronic Diastolic Heart failure. She was diuresed with good outcome. She now has Generalized weakness from hospitalization. She has been admitted for a short-term rehabilitation.  REASSESSMENT OF ONGOING PROBLEMS:  HYPERLIPIDEMIA: No complications from the medications presently being used. 6/15 fasting lipid panel showed : Cholesterol 141 triglycerides 56 HDL 89 and the LDL 41  ATRIAL FIBRILLATION: the patients atrial fibrillation remains stable.  The patient denies DOE, tachycardia, orthopnea, transient neurological sx,  palpitations, & PNDs.  No complications noted from the medications currently being used.  HYPOTHYROIDISM: The hypothyroidism remains stable. No complications noted from the medications presently being used.  The patient denies fatigue or constipation.  8/15 TSH 2.760  ALLERGIC RHINITIS: Allergic rhinitis remains stable.  Patient denies ongoing symptoms such as runny nose sneezing or tearing. No complications reported from the current medication(s) being used.  PAST MEDICAL HISTORY:  Past Medical History  Diagnosis Date  . Dyslipidemia   . Hypertension   . Palpitations     PVCs/bigeminy on event in May 2009 revealing this was relatively asymptomatic  . Chest pain     a. 2011 Neg MV;  b. 01/2014 Cath: LM 20-30, LAD nl, D1 nl, LCX nl, OM1 nl, RCA dom 30-72m, PD/PL  nl, EF 65%->Med Rx.  . Degenerative joint disease   . Hx of varicella   . History of skin cancer     tafeen/ non melanoma.   . Thrombocytopenia   . Anemia   . Monoclonal gammopathies   . Right lower quadrant abdominal pain 07/19/2012    Recurrent  With nausea    This is new since she had ct for llq pain in [facility]    R/o appendiceal problem  Hernia  Get ct scan  And plan  Fu     . Diastolic CHF, acute on chronic 01/31/2012  . Atrial fibrillation     a. Dx 01/2014->Eliquis started.  . Cancer   . Pacemaker   . Dysrhythmia     CURRENT MEDICATIONS: Reviewed per MAR/see medication list  Allergies  Allergen Reactions  . Tramadol Nausea Only     REVIEW OF SYSTEMS:  GENERAL: no change in appetite, no fatigue, no weight changes, no fever, chills or weakness RESPIRATORY: no cough, SOB, DOE, wheezing, hemoptysis CARDIAC: no chest pain, or palpitations, +edema GI: no abdominal pain, diarrhea, constipation, heart burn, nausea or vomiting  PHYSICAL EXAMINATION  GENERAL: no acute distress, normal body habitus EYES: conjunctivae normal, sclerae normal, normal eye lids NECK: supple, trachea midline, no neck masses, no thyroid tenderness, no thyromegaly LYMPHATICS: no LAN in the neck, no supraclavicular LAN RESPIRATORY: breathing is even & unlabored, BS CTAB CARDIAC: RRR, no murmur,no extra heart sounds, BLE edema 1+; has right chest pacemaker GI: abdomen soft, normal BS, no masses, no tenderness, no hepatomegaly, no splenomegaly EXTREMITIES: able to move all 4 extremities PSYCHIATRIC: the patient is alert & oriented to person,  affect & behavior appropriate  LABS/RADIOLOGY: Labs reviewed: Basic Metabolic Panel:  Recent Labs  04/13/14 1928  07/04/14 1349  08/11/14 1604  09/24/14 0445 09/25/14 0520 09/26/14 0453  NA  --   < > 141  < > 134*  < > 143 139 141  K  --   < > 4.5  < > 4.6  < > 4.3 3.7 3.7  CL  --   < > 100  < > 98  < > 105 100 101  CO2  --   < > 28  < > 29  < > 25 28 28     GLUCOSE  --   < > 117*  < > 109*  < > 124* 115* 168*  BUN  --   < > 24*  < > 24*  < > 27* 27* 24*  CREATININE  --   < > 1.49*  < > 1.7*  < > 1.60* 1.64* 1.61*  CALCIUM  --   < > 9.9  < > 9.5  < > 9.3 8.7 8.6  MG 2.2  --  2.2  --  2.1  --   --   --   --   < > = values in this interval not displayed.  Liver Function Tests:  Recent Labs  04/13/14 1650 04/14/14 0656 07/04/14 1349  AST 17 12 14   ALT 11 8 10   ALKPHOS 91 75 99  BILITOT 0.8 1.0 0.6  PROT 7.4 6.2 7.0  ALBUMIN 3.5 2.9* 3.2*    Recent Labs  11/01/13 0500  LIPASE 41   CBC:  Recent Labs  07/04/14 1349  08/11/14 1604 09/06/14 1404 09/22/14 1119  WBC 5.7  < > 6.9 4.5 5.8  NEUTROABS 4.3  --  5.2 3.0  --   HGB 9.6*  < > 11.6* 13.2 13.7  HCT 31.3*  < > 35.7* 42.4 43.0  MCV 87.2  < > 83.1 88.3 90.7  PLT 217  < > 222.0 118* 112*  < > = values in this interval not displayed.  Lipid Panel:  Recent Labs  01/30/14 0330 04/14/14 0656  HDL 89 89   Cardiac Enzymes:  Recent Labs  07/04/14 2016 07/05/14 0147 09/06/14 1404  TROPONINI <0.30 <0.30 <0.30   CBG:  Recent Labs  04/25/14 2007 04/25/14 2337  GLUCAP 146* 127*    Dg Chest 2 View  09/06/2014   CLINICAL DATA:  Chest pain, left rib pain  EXAM: CHEST  2 VIEW  COMPARISON:  07/12/2014  FINDINGS: Cardiomegaly with pulmonary vascular congestion/chronic compensated heart failure. No frank interstitial edema. No pleural effusion or pneumothorax.  Right subclavian pacemaker.  Degenerative changes of the visualized thoracolumbar spine.  Cholecystectomy clips.  IMPRESSION: No evidence of acute cardiopulmonary disease.   Electronically Signed   By: Julian Hy M.D.   On: 09/06/2014 20:09    ASSESSMENT/PLAN:  Generalized weakness - for rehabilitation Diastolic heart failure - stable; continue Demadex 20 mg by mouth twice a day and metolazone 2.5 mg by mouth daily when necessary for weight > 144 pounds; continue O2 at 2 L/m via [place]  continuously Paroxysmal atrial fibrillation - rate controlled; continue amiodarone 200 mg by mouth daily and metoprolol 25 mg twice a day Chronic kidney disease, stage III - stable; creatinine 1.61 Anemia, iron deficiency - stable; continue ferrous sulfate 325 mg twice a day Hypothyroidism - stable; TSH 2.760; continue levothyroxine 75 g by mouth daily Allergic rhinitis - stable; continue loratadine 10 mg 1  tab by mouth daily Anxiety - mood  is stable; continue Ativan 0.25 mg by mouth every 8 hours when necessary Hypokalemia - K 3.1 ; continue K[place]-Con 20 MEQ by mouth twice a day  Hyperlipidemia  - continue Zocor 10 mg by mouth daily at bedtime  GERD - stable; continue Protonix 40 mg by mouth daily   Spent 50 minutes in patient care.    [facility], NP [employer] [phone]

## 2014-10-02 NOTE — Progress Notes (Signed)
Patient ID: [name], female   DOB: 26-Feb-1925, 80 y.o.   MRN: [id number]     [facility]   PCP: [name], MD  Code Status: full code  Allergies  Allergen Reactions  . Tramadol Nausea Only    Chief Complaint  Patient presents with  . New Admit To SNF     HPI:  80 y/o female pt here for STR after hospital admission from 09/22/14-09/26/14 with acute diastolic heart failure with cardiorenal syndrome. She responded well to diuresis post right heart catheterization. She has past medical history of nonobstructive CAD s/p cardiac catheterization in 01/2014, persistent atrial fibrillation, diastolic CHF, HTN, HL, anemia, CKD and is s/p pacemaker.  She is seen in her room today. She feels weak and tired but denies any concerns. Her breathing is stable and is on o2. No new concern from nursing staff.  Review of Systems:  Constitutional: Negative for fever, chills,diaphoresis.  HENT: Negative for congestion  Respiratory: Negative for cough, sputum production, shortness of breath and wheezing.   Cardiovascular: Negative for chest pain, palpitations, orthopnea Gastrointestinal: Negative for heartburn, nausea, vomiting, abdominal pain. Poor appetite. Had bowel movement today Genitourinary: Negative for dysuria Musculoskeletal: Negative for back pain, falls Neurological: Negative for dizziness and headaches.  Psychiatric/Behavioral: Negative for depression  Past Medical History  Diagnosis Date  . Dyslipidemia   . Hypertension   . Palpitations     PVCs/bigeminy on event in May 2009 revealing this was relatively asymptomatic  . Chest pain     a. 2011 Neg MV;  b. 01/2014 Cath: LM 20-30, LAD nl, D1 nl, LCX nl, OM1 nl, RCA dom 30-76m, PD/PL nl, EF 65%->Med Rx.  . Degenerative joint disease   . Hx of varicella   . History of skin cancer     tafeen/ non melanoma.   . Thrombocytopenia   . Anemia   . Monoclonal gammopathies   . Right  lower quadrant abdominal pain 07/19/2012    Recurrent  With nausea    This is new since she had ct for llq pain in [facility]    R/o appendiceal problem  Hernia  Get ct scan  And plan  Fu     . Diastolic CHF, acute on chronic 01/31/2012  . Atrial fibrillation     a. Dx 01/2014->Eliquis started.  . Cancer   . Pacemaker   . Dysrhythmia    Past Surgical History  Procedure Laterality Date  . Cholecystectomy  1999  . Cesarean section      times 2  . Shoulder surgery  1996    Right   . Cataract extraction      Bilateral  implantt  . Cardioversion N/A 02/26/2014    Procedure: CARDIOVERSION AT BEDSIDE;  Surgeon: Pixie Casino, MD;  Location: [place];  Service: Cardiovascular;  Laterality: N/A;  . Cardioversion N/A 04/22/2014    Procedure: CARDIOVERSION (BEDSIDE);  Surgeon: [name], MD;  Location: [facility] OR;  Service: Cardiovascular;  Laterality: N/A;  . Pacemaker insertion  04/23/2014    STJ Assurity dual chamber pacemaker implanted by Dr Rayann Heman  . Insert / replace / remove pacemaker    . Esophagogastroduodenoscopy N/A 07/06/2014    Procedure: ESOPHAGOGASTRODUODENOSCOPY (EGD);  Surgeon: Ladene Artist, MD;  Location: [facility] END[facility]COPY;  Service: Endoscopy;  Laterality: N/A;   Social History:   reports that she has never smoked. She has never used smokeless tobacco. She reports that she does not drink alcohol or use  illicit drugs.  Family History  Problem Relation Age of Onset  . Coronary artery disease Father   . Heart disease Father   . Lung cancer    . Alzheimer's disease Mother   . Cancer Brother 59    lung cancer  . Heart attack Father     Medications: Patient's Medications  New Prescriptions   No medications on file  Previous Medications   ACETAMINOPHEN (TYLENOL) 325 MG TABLET    Take 2 tablets (650 mg total) by mouth every 4 (four) hours as needed for headache or mild pain.   AMIODARONE (PACERONE) 200 MG TABLET    Take 1 tablet (200 mg total) by mouth daily.   APIXABAN (ELIQUIS) 2.5  MG TABS TABLET    Take 1 tablet (2.5 mg total) by mouth 2 (two) times daily.   FERROUS SULFATE 325 (65 FE) MG TABLET    Take 1 tablet (325 mg total) by mouth 2 (two) times daily with a meal.   LEVOTHYROXINE (SYNTHROID, LEVOTHROID) 75 MCG TABLET    Take 1 tablet (75 mcg total) by mouth daily.   LORATADINE (CLARITIN) 10 MG TABLET    Take 10 mg by mouth daily as needed for allergies.    LORAZEPAM (ATIVAN) 0.5 MG TABLET    Take 0.5-1 tablets (0.25-0.5 mg total) by mouth every 8 (eight) hours as needed for anxiety.   METOLAZONE (ZAROXOLYN) 2.5 MG TABLET    Take 1 tablet (2.5 mg total) by mouth daily as needed. Take only if weight in AM is greater than 144 pounds.   METOPROLOL TARTRATE (LOPRESSOR) 25 MG TABLET    Take 1 tablet (25 mg total) by mouth 2 (two) times daily.   MICONAZOLE (MICOTIN) 2 % CREAM    Apply topically 2 (two) times daily.   NITROGLYCERIN (NITROSTAT) 0.4 MG SL TABLET    Place 1 tablet (0.4 mg total) under the tongue every 5 (five) minutes as needed. For chest pain   PANTOPRAZOLE (PROTONIX) 40 MG TABLET    Take 1 tablet (40 mg total) by mouth daily.   POTASSIUM CHLORIDE SA (KLOR-CON M20) 20 MEQ TABLET    Take 1 tablet (20 mEq total) by mouth 2 (two) times daily.   SENNA (SENOKOT) 8.6 MG TABS TABLET    Take 2 tablets (17.2 mg total) by mouth daily as needed for mild constipation.   SIMVASTATIN (ZOCOR) 10 MG TABLET    Take 1 tablet (10 mg total) by mouth at bedtime.   TORSEMIDE (DEMADEX) 20 MG TABLET    Take 1 tablet (20 mg total) by mouth 2 (two) times daily.  Modified Medications   No medications on file  Discontinued Medications   No medications on file     Physical Exam: Filed Vitals:   10/02/14 0957  BP: 115/70  Pulse: 66  Temp: 97 F (36.1 C)  Resp: 18  Weight: 140 lb 3.2 oz (63.594 kg)  SpO2: 97%    General- elderly chronically ill appearing female in no acute distress Head- atraumatic, normocephalic Eyes-  no pallor, no icterus, no discharge Neck- no cervical  lymphadenopathy Throat- moist mucus membrane Cardiovascular- normal s1,s2, no murmurs, edema 1+ in both legs Respiratory- bilateral poor air entry, no wheeze, no crackles, no use of accessory muscles, on o2 Abdomen- bowel sounds present, soft, non tender Musculoskeletal- able to move all 4 extremities, generalized weakness Neurological- no focal deficit Skin- warm and dry Psychiatry- alert and oriented to person   Labs reviewed: Basic Metabolic Panel:  Recent Labs  04/13/14 1928  07/04/14 1349  08/11/14 1604  09/24/14 0445 09/25/14 0520 09/26/14 0453  NA  --   < > 141  < > 134*  < > 143 139 141  K  --   < > 4.5  < > 4.6  < > 4.3 3.7 3.7  CL  --   < > 100  < > 98  < > 105 100 101  CO2  --   < > 28  < > 29  < > 25 28 28   GLUCOSE  --   < > 117*  < > 109*  < > 124* 115* 168*  BUN  --   < > 24*  < > 24*  < > 27* 27* 24*  CREATININE  --   < > 1.49*  < > 1.7*  < > 1.60* 1.64* 1.61*  CALCIUM  --   < > 9.9  < > 9.5  < > 9.3 8.7 8.6  MG 2.2  --  2.2  --  2.1  --   --   --   --   < > = values in this interval not displayed. Liver Function Tests:  Recent Labs  04/13/14 1650 04/14/14 0656 07/04/14 1349  AST 17 12 14   ALT 11 8 10   ALKPHOS 91 75 99  BILITOT 0.8 1.0 0.6  PROT 7.4 6.2 7.0  ALBUMIN 3.5 2.9* 3.2*    Recent Labs  11/01/13 0500  LIPASE 41   No results for input(s): AMMONIA in the last 8760 hours. CBC:  Recent Labs  07/04/14 1349  08/11/14 1604 09/06/14 1404 09/22/14 1119  WBC 5.7  < > 6.9 4.5 5.8  NEUTROABS 4.3  --  5.2 3.0  --   HGB 9.6*  < > 11.6* 13.2 13.7  HCT 31.3*  < > 35.7* 42.4 43.0  MCV 87.2  < > 83.1 88.3 90.7  PLT 217  < > 222.0 118* 112*  < > = values in this interval not displayed. Cardiac Enzymes:  Recent Labs  07/04/14 2016 07/05/14 0147 09/06/14 1404  TROPONINI <0.30 <0.30 <0.30   BNP: Invalid input(s): POCBNP CBG:  Recent Labs  04/25/14 2007 04/25/14 2337  GLUCAP 146* 127*   Wt Readings from Last 3 Encounters:    10/02/14 140 lb 3.2 oz (63.594 kg)  09/29/14 141 lb (63.957 kg)  09/26/14 141 lb 12.1 oz (64.3 kg)   Assessment/Plan  generalized weakness Will have her work with physical therapy and occupational therapy team to help with gait training and muscle strengthening exercises.fall precautions. Skin care. Encourage to be out of bed. Continue o2. Monitor po intake  chf With diastolic HF. Continue lopressor 25 mg bid, demadex 20 mg bid, o2 by Lynn. Continue kcl supplement and monitor bmp. Add zaroxylyn 2.5 mg qd prn for wt > 144 lb only. Weight stable.Monitor daily weight. To follow in chf clinic  afib Rate controlled. Continue pacerone 200 mg daily, lopressor 25 mg bid with eliquis for anticoagulation  Iron def anemia Continue ferrous sulfate, monitor h&h  Hypothyroidism Continue synthroid 75 mcg daily  gerd On protonix 40 mg daily, stable  HLD Continue zocor 10 mg qhs  Constipation Continue senna 2 tab daily  ckd Monitor renal function   Family/ staff Communication: reviewed care plan with patient and nursing supervisor  Goals of care: short term rehabilitation    Labs/tests ordered: cbc, cmp    Blanchie Serve, MD  [facility] [phone] (Monday-Friday  8 am - 5 pm) [phone] (afterhours)

## 2014-10-06 ENCOUNTER — Ambulatory Visit (HOSPITAL_COMMUNITY)
Admission: RE | Admit: 2014-10-06 | Discharge: 2014-10-06 | Disposition: A | Discharge status: Home / Self Care | Payer: Medicare Other | Source: Ambulatory Visit | Attending: Internal Medicine | Admitting: Internal Medicine

## 2014-10-06 VITALS — BP 90/60 | HR 58 | Wt 146.0 lb

## 2014-10-06 DIAGNOSIS — I482 Chronic atrial fibrillation: Secondary | ICD-10-CM | POA: Diagnosis not present

## 2014-10-06 DIAGNOSIS — I480 Paroxysmal atrial fibrillation: Secondary | ICD-10-CM

## 2014-10-06 DIAGNOSIS — N183 Chronic kidney disease, stage 3 (moderate): Secondary | ICD-10-CM | POA: Diagnosis not present

## 2014-10-06 DIAGNOSIS — F419 Anxiety disorder, unspecified: Secondary | ICD-10-CM | POA: Diagnosis not present

## 2014-10-06 DIAGNOSIS — I5032 Chronic diastolic (congestive) heart failure: Secondary | ICD-10-CM | POA: Diagnosis not present

## 2014-10-06 DIAGNOSIS — I10 Essential (primary) hypertension: Secondary | ICD-10-CM | POA: Diagnosis not present

## 2014-10-06 NOTE — Patient Instructions (Signed)
Follow up with our office in 4 weeks.  Happy Thanksgiving!  Do the following things EVERYDAY: 1) Weigh yourself in the morning before breakfast. Write it down and keep it in a log. 2) Take your medicines as prescribed 3) Eat low salt foods-Limit salt (sodium) to 2000 mg per day.  4) Stay as active as you can everyday 5) Limit all fluids for the day to less than 2 liters

## 2014-10-06 NOTE — Progress Notes (Signed)
Patient ID: [name], female   DOB: 08-Dec-1924, 80 y.o.   MRN: [id number]  PCP: Dr Netty Starring Primary Cardiologist: Dr. Harrington Challenger  HPI: [name] is a 80 y.o. female with the history of nonobstructive CAD by cardiac catheterization in 01/2014, persistent atrial fibrillation, diastolic CHF, HTN, HL, anemia, and CKD. Patient was admitted 04/2014 x2 with acute on chronic diastolic CHF. She underwent cardioversion with restoration of NSR. This resulted in profound bradycardia and hypotension requiring pacemaker implantation. Pacemaker implant was complicated by acute blood loss anemia requiring transfusion with PRBCs  Admitted to [facility] August 21 with increased dyspnea. Diuresed with IV lasix and transitioned to po lasix 40 mg twice a day. GI evaluated due to anemia and black stool. Had EGD with no acute findings. Colonoscopy was cancelled per daughter due to multiple medical problems. She continued on eliquis 2.5 mg twice a day.  Palliative Care consulted and the patient and family request aggressive care for heart failure. Discharge weight was 152 pounds.   Admitted to [facility] 11/9 after RHC due to elevated filling pressures. Once diuresed she was put on torsemide 20 mg twice a day. She was also restarted on amiodarone 200 mg daily and continue on eliquis 2.5 mg twice a day. Discharge weight was 141 pounds.   Post hospital  follow up:  Overall she feels better. She has been treated for UTI and feeling better. Breathing at baseline.  Mild dyspnea with exertion. Living at [facility] for ongoing rehab. Weight at facility 142-144 pounds.  Following a low salt diet and drinking less than 2L a day. Ongoing lower extremity edema.   Studies:  - LHC (3/15): Mid LM 20-30%, mid RCA 30-40%, EF 65%  - Echo (4/15): Mild LVH, EF 60-65%, no RWMA, mild BAE, trivial eff  - Carotid US (5/15): 40-59% RICA stenosis. 1-01% LICA stenosis.   RHC 09/22/14  RA = 11 RV = 49/6/10 PA = 49/22 (34) PCW = 18 (v = 25) Fick  cardiac output/index = 6.4/4.0 PVR = 2.5 WU FA sat = 95% PA sat = 72%,74%  Labs 07/18/14 K 3.5 Creatinine 1.44 Hgb 10.7  Labs 07/14/14 K 4.1 Creatinine 1.46  Hgb 9.8  Labs 07/04/14 TSH 2.7 Labs 09/06/14: K 4.0, creatinine 1.93 Labs 09/15/14 K 4.6 Creatinine 3.9  Labs 09/26/14 K 3.6 Creatinine 1.61 hgb 13.7 Labs 10/01/14 K 4.4 Creatinine 1.7 Hgb 14/1   ROS: All systems negative except as listed in HPI, PMH and Problem List.  Past Medical History  Diagnosis Date  . Dyslipidemia   . Hypertension   . Palpitations     PVCs/bigeminy on event in May 2009 revealing this was relatively asymptomatic  . Chest pain     a. 2011 Neg MV;  b. 01/2014 Cath: LM 20-30, LAD nl, D1 nl, LCX nl, OM1 nl, RCA dom 30-20m, PD/PL nl, EF 65%->Med Rx.  . Degenerative joint disease   . Hx of varicella   . History of skin cancer     tafeen/ non melanoma.   . Thrombocytopenia   . Anemia   . Monoclonal gammopathies   . Right lower quadrant abdominal pain 07/19/2012    Recurrent  With nausea    This is new since she had ct for llq pain in [facility]    R/o appendiceal problem  Hernia  Get ct scan  And plan  Fu     . Diastolic CHF, acute on chronic 01/31/2012  . Atrial fibrillation     a. Dx 01/2014->Eliquis  started.  . Cancer   . Pacemaker   . Dysrhythmia     Current Outpatient Prescriptions  Medication Sig Dispense Refill  . acetaminophen (TYLENOL) 325 MG tablet Take 2 tablets (650 mg total) by mouth every 4 (four) hours as needed for headache or mild pain.    Marland Kitchen amiodarone (PACERONE) 200 MG tablet Take 1 tablet (200 mg total) by mouth daily. 30 tablet 6  . apixaban (ELIQUIS) 2.5 MG TABS tablet Take 1 tablet (2.5 mg total) by mouth 2 (two) times daily.    . ciprofloxacin (CIPRO) 250 MG tablet Take 250 mg by mouth 2 (two) times daily.    . ferrous sulfate 325 (65 FE) MG tablet Take 1 tablet (325 mg total) by mouth 2 (two) times daily with a meal. 60 tablet 1  . levothyroxine (SYNTHROID, LEVOTHROID) 75 MCG tablet Take 1  tablet (75 mcg total) by mouth daily. 90 tablet 2  . loratadine (CLARITIN) 10 MG tablet Take 10 mg by mouth daily as needed for allergies.     Marland Kitchen LORazepam (ATIVAN) 0.5 MG tablet Take 0.5-1 tablets (0.25-0.5 mg total) by mouth every 8 (eight) hours as needed for anxiety. 30 tablet 1  . metolazone (ZAROXOLYN) 2.5 MG tablet Take 1 tablet (2.5 mg total) by mouth daily as needed. Take only if weight in AM is greater than 144 pounds. 30 tablet 6  . metoprolol tartrate (LOPRESSOR) 25 MG tablet Take 1 tablet (25 mg total) by mouth 2 (two) times daily. 30 tablet 0  . nitroGLYCERIN (NITROSTAT) 0.4 MG SL tablet Place 1 tablet (0.4 mg total) under the tongue every 5 (five) minutes as needed. For chest pain (Patient taking differently: Place 0.4 mg under the tongue every 5 (five) minutes as needed (MAX 3 TABLETS). For chest pain) 25 tablet 6  . pantoprazole (PROTONIX) 40 MG tablet Take 1 tablet (40 mg total) by mouth daily. 30 tablet 5  . potassium chloride SA (KLOR-CON M20) 20 MEQ tablet Take 1 tablet (20 mEq total) by mouth 2 (two) times daily. 60 tablet 6  . saccharomyces boulardii (FLORASTOR) 250 MG capsule Take 250 mg by mouth 2 (two) times daily.    Marland Kitchen senna (SENOKOT) 8.6 MG TABS tablet Take 2 tablets (17.2 mg total) by mouth daily as needed for mild constipation. 30 tablet 6  . simvastatin (ZOCOR) 10 MG tablet Take 1 tablet (10 mg total) by mouth at bedtime.    . torsemide (DEMADEX) 20 MG tablet Take 1 tablet (20 mg total) by mouth 2 (two) times daily. 60 tablet 6   No current facility-administered medications for this encounter.      Filed Vitals:   10/06/14 1015  BP: 90/60  Pulse: 58  Weight: 146 lb (66.225 kg)  SpO2: 100%     PHYSICAL EXAM: General:  Elderly Chronically ill appearing. No resp difficulty. Arrived in wheel chair. Daughter present HEENT: normal Neck: supple. JVP 5-6 . Carotids 2+ bilaterally; no bruits. No lymphadenopathy or thryomegaly appreciated. Cor: PMI normal. Regular  rate & regular rhythm. No rubs, gallops or murmurs. Lungs: clear Abdomen: Obese, soft, nontender, nondistended. No hepatosplenomegaly. No bruits or masses. Good bowel sounds. Extremities: no cyanosis, clubbing, rash, R and LLE 1-2+ bilateral edema Neuro: alert & orientedx3, cranial nerves grossly intact. Moves all 4 extremities w/o difficulty. Affect pleasant.   ASSESSMENT & PLAN:  1. Chronic Diastolic Heart Failure I reviewed discharge summary from 09/26/14. I have also reviewed lab work from [facility] at [facility].  - NYHA  III symptoms. Volume status stable. Continue torsemide 20 mg twice a day with metolazone as needed. -  Place TED hose - Reinforced the need and importance of daily weights, a low sodium diet, and fluid restriction (less than 2 L a day). Instructed to call the HF clinic if weight increases more than 3 lbs overnight or 5 lbs in a week.  2. Chronic A fib- - regular rate. Continue amiodarone 200 mg daily and metoprolol back to 25 mg twice a day.  Continue Eliquis 2.5 mg twice a day.  3. HTN - Watch BP closely. SBP running in 90s in the hospital as well. Continue current meds.  4. CKD stage III - Difficult to know what patients baseline creatinine is because about 2 months ago was 1.4 but now has been running closer to 2.0. Pending BMET from [place].  5. Anxiety - Stable.   6. Frequent BMs- Need to check for C-diff per [facility].   Follow up in 4 weeks.  Trusten Hume NP-C  10:27 AM

## 2014-10-13 ENCOUNTER — Ambulatory Visit: Payer: Medicare Other | Admitting: Internal Medicine

## 2014-10-23 ENCOUNTER — Encounter (HOSPITAL_COMMUNITY): Payer: Self-pay | Admitting: Cardiovascular Disease

## 2014-10-24 ENCOUNTER — Telehealth: Payer: Self-pay | Admitting: Internal Medicine

## 2014-10-24 NOTE — Telephone Encounter (Signed)
PLEASE NOTE: All timestamps contained within this report are represented as [place] Standard Time. CONFIDENTIALTY NOTICE: This fax transmission is intended only for the addressee. It contains information that is legally privileged, confidential or otherwise protected from use or disclosure. If you are not the intended recipient, you are strictly prohibited from reviewing, disclosing, copying using or disseminating any of this information or taking any action in reliance on or regarding this information. If you have received this fax in error, please notify us immediately by telephone so that we can arrange for its return to [place]. Phone: [phone], Toll-Free: [phone], Fax: [phone] Page: 1 of 2 Call Id: [id number] [place] [facility] - Client [place] Patient Name: [name] [facility] Gender: Female DOB: [birth year] Age: 80 Y 59 M 10 D Return Phone Number: [phone] (Primary) Address: [address] City/State/Zip: [place] [address] Client [facility] - Client Client Site [facility] - Day Physician Shanon Ace Contact Type Call Call Type Triage / Clinical Relationship To Patient Self Return Phone Number [phone] (Primary) Chief Complaint Face Injury Initial Comment Caller States she is bleeding from the top side of her nose and wont stop bleeding. has a band aide on it, wants to know what she can do to stop it from bleeding. PreDisposition Home Care Nurse Assessment Nurse: Harlow Mares, RN, Suanne Marker Date/Time [name] Time): 10/24/2014 3:08:19 PM Confirm and document reason for call. If symptomatic, describe symptoms. ---Caller States she is bleeding from the top side of her nose and wont stop bleeding. has a band aide on it, wants to know what she can do to stop it from bleeding. Doesn't remember scratching her nose, but states that she feels that she had a dry  patch on her nose and she states that it began to bleed. She denies facial injury. She reports that she doesn't have a nosebleed, She reports that she is currently taking Eloquis .5mg  BID. Has the patient traveled out of the country within the last 30 days? ---No Does the patient require triage? ---Yes Related visit to physician within the last 2 weeks? ---No Does the PT have any chronic conditions? (i.e. diabetes, asthma, etc.) ---Yes List chronic conditions. ---a-fib Guidelines Guideline Title Affirmed Question Affirmed Notes Nurse Date/Time [name] Time) Skin Injury Minor cut or scratch (all triage questions negative) Harlow Mares, RN, Suanne Marker 10/24/2014 3:13:34 PM Disp. Time [name] Time) Disposition Final User 10/24/2014 3:19:48 PM Call Completed Harlow Mares, RN, Rhonda 10/24/2014 3:18:30 PM Home Care Yes Harlow Mares, RN, Suanne Marker PLEASE NOTE: All timestamps contained within this report are represented as [place] Standard Time. CONFIDENTIALTY NOTICE: This fax transmission is intended only for the addressee. It contains information that is legally privileged, confidential or otherwise protected from use or disclosure. If you are not the intended recipient, you are strictly prohibited from reviewing, disclosing, copying using or disseminating any of this information or taking any action in reliance on or regarding this information. If you have received this fax in error, please notify us immediately by telephone so that we can arrange for its return to [place]. Phone: [phone], Toll-Free: [phone], Fax: [phone] Page: 2 of 2 Call Id: [id number] Caller Understands: Yes Disagree/Comply: Comply Care Advice Given Per Guideline HOME CARE: You should be able to treat this at home. REASSURANCE: It sounds like a minor cut or scratch that we can treat at home. BLEEDING: Apply direct pressure for 10 minutes with a sterile gauze to stop any bleeding. CLEANSING: [employer]  the wound with soap and water for  5 minutes. * For any dirt, soak the wound (and scrub it) with a wash cloth. * Cut off any pieces of loose skin using a fine scissors (cleaned with rubbing alcohol). ANTIBIOTIC OINTMENT: * Apply an ANTIBIOTIC OINTMENT (e.g., OTC Bacitracin), covered by a Band-Aid or dressing. Change daily or if it becomes wet. CALL BACK IF: * Looks infected (pus, redness, increasing tenderness) * Bleeding does not stop after using direct pressure * Doesn't heal within 14 days * You become worse. CARE ADVICE given per Skin Injury (Adult) guideline. * Dirt in the wound persists after 15 minutes of scrubbing After Care Instructions Given Call Event Type User Date / Time Description Comments User: Tamala Fothergill, RN Date/Time (Eastern Time): 10/24/2014 3:19:37 PM Caller denies active bleeding but reports that she has had bleeding off/on for the last 2 days. Instructed on proper technique for holding pressure. Voiced understanding.

## 2014-10-24 NOTE — Telephone Encounter (Signed)
Notified pt of Dr. [name] recommendation.  Pt voiced understanding and stated the bleeding had stopped at the time but would follow these instructions if it started back.  Pt also advised to seek treatment at urgent care for excessive bleeding or if bleeding does not stop.

## 2014-10-24 NOTE — Telephone Encounter (Signed)
Advise very firm pressure for full 15 minutes with sterile gauze saturated with glob of OTC antibiotic ointment.Can try this several times consecutively. Once stops bleeding advise she not touch it for 24 hours and avoid scrubbing or manipulating scab. I advise [facility] if a lot of bleeding or can not get it to stop bleeding as is late in day.

## 2014-10-25 ENCOUNTER — Encounter (HOSPITAL_COMMUNITY): Payer: Self-pay | Admitting: Emergency Medicine

## 2014-10-25 ENCOUNTER — Inpatient Hospital Stay (HOSPITAL_COMMUNITY)
Admission: EM | Admit: 2014-10-25 | Discharge: 2014-10-30 | DRG: 292 | Disposition: A | Discharge status: Home Health | Payer: Medicare Other | Attending: Cardiology | Admitting: Cardiology

## 2014-10-25 ENCOUNTER — Telehealth: Payer: Self-pay | Admitting: Cardiology

## 2014-10-25 ENCOUNTER — Emergency Department (HOSPITAL_COMMUNITY): Payer: Medicare Other

## 2014-10-25 DIAGNOSIS — K5900 Constipation, unspecified: Secondary | ICD-10-CM | POA: Diagnosis not present

## 2014-10-25 DIAGNOSIS — I4891 Unspecified atrial fibrillation: Secondary | ICD-10-CM | POA: Diagnosis present

## 2014-10-25 DIAGNOSIS — E039 Hypothyroidism, unspecified: Secondary | ICD-10-CM | POA: Diagnosis present

## 2014-10-25 DIAGNOSIS — R0602 Shortness of breath: Secondary | ICD-10-CM | POA: Diagnosis present

## 2014-10-25 DIAGNOSIS — Z7901 Long term (current) use of anticoagulants: Secondary | ICD-10-CM | POA: Diagnosis not present

## 2014-10-25 DIAGNOSIS — M5135 Other intervertebral disc degeneration, thoracolumbar region: Secondary | ICD-10-CM | POA: Diagnosis present

## 2014-10-25 DIAGNOSIS — D649 Anemia, unspecified: Secondary | ICD-10-CM | POA: Diagnosis present

## 2014-10-25 DIAGNOSIS — Z9049 Acquired absence of other specified parts of digestive tract: Secondary | ICD-10-CM | POA: Diagnosis present

## 2014-10-25 DIAGNOSIS — I959 Hypotension, unspecified: Secondary | ICD-10-CM | POA: Diagnosis present

## 2014-10-25 DIAGNOSIS — R110 Nausea: Secondary | ICD-10-CM | POA: Diagnosis not present

## 2014-10-25 DIAGNOSIS — I509 Heart failure, unspecified: Secondary | ICD-10-CM

## 2014-10-25 DIAGNOSIS — N183 Chronic kidney disease, stage 3 unspecified: Secondary | ICD-10-CM | POA: Diagnosis present

## 2014-10-25 DIAGNOSIS — R001 Bradycardia, unspecified: Secondary | ICD-10-CM | POA: Diagnosis present

## 2014-10-25 DIAGNOSIS — E785 Hyperlipidemia, unspecified: Secondary | ICD-10-CM | POA: Diagnosis present

## 2014-10-25 DIAGNOSIS — Z950 Presence of cardiac pacemaker: Secondary | ICD-10-CM

## 2014-10-25 DIAGNOSIS — I10 Essential (primary) hypertension: Secondary | ICD-10-CM | POA: Diagnosis present

## 2014-10-25 DIAGNOSIS — I5033 Acute on chronic diastolic (congestive) heart failure: Principal | ICD-10-CM

## 2014-10-25 DIAGNOSIS — I2510 Atherosclerotic heart disease of native coronary artery without angina pectoris: Secondary | ICD-10-CM | POA: Diagnosis present

## 2014-10-25 DIAGNOSIS — Z885 Allergy status to narcotic agent status: Secondary | ICD-10-CM

## 2014-10-25 DIAGNOSIS — M1990 Unspecified osteoarthritis, unspecified site: Secondary | ICD-10-CM | POA: Diagnosis present

## 2014-10-25 DIAGNOSIS — N184 Chronic kidney disease, stage 4 (severe): Secondary | ICD-10-CM | POA: Diagnosis present

## 2014-10-25 DIAGNOSIS — Z85828 Personal history of other malignant neoplasm of skin: Secondary | ICD-10-CM | POA: Diagnosis not present

## 2014-10-25 DIAGNOSIS — Z66 Do not resuscitate: Secondary | ICD-10-CM | POA: Diagnosis present

## 2014-10-25 DIAGNOSIS — R079 Chest pain, unspecified: Secondary | ICD-10-CM

## 2014-10-25 DIAGNOSIS — F419 Anxiety disorder, unspecified: Secondary | ICD-10-CM | POA: Diagnosis not present

## 2014-10-25 DIAGNOSIS — R0789 Other chest pain: Secondary | ICD-10-CM

## 2014-10-25 DIAGNOSIS — I129 Hypertensive chronic kidney disease with stage 1 through stage 4 chronic kidney disease, or unspecified chronic kidney disease: Secondary | ICD-10-CM | POA: Diagnosis present

## 2014-10-25 DIAGNOSIS — Z8249 Family history of ischemic heart disease and other diseases of the circulatory system: Secondary | ICD-10-CM

## 2014-10-25 LAB — BASIC METABOLIC PANEL
Anion gap: 13 (ref 5–15)
BUN: 32 mg/dL — ABNORMAL HIGH (ref 6–23)
CO2: 26 mEq/L (ref 19–32)
Calcium: 10.1 mg/dL (ref 8.4–10.5)
Chloride: 103 mEq/L (ref 96–112)
Creatinine, Ser: 1.93 mg/dL — ABNORMAL HIGH (ref 0.50–1.10)
GFR calc Af Amer: 26 mL/min — ABNORMAL LOW (ref >90)
GFR calc non Af Amer: 22 mL/min — ABNORMAL LOW (ref >90)
Glucose, Bld: 110 mg/dL — ABNORMAL HIGH (ref 70–99)
POTASSIUM: 4.8 meq/L (ref 3.7–5.3)
SODIUM: 142 meq/L (ref 137–147)

## 2014-10-25 LAB — CBC WITH DIFFERENTIAL/PLATELET
BASOS PCT: 1 % (ref 0–1)
Basophils Absolute: 0.1 10*3/uL (ref 0.0–0.1)
Eosinophils Absolute: 0.1 10*3/uL (ref 0.0–0.7)
Eosinophils Relative: 1 % (ref 0–5)
HCT: 40.2 % (ref 36.0–46.0)
Hemoglobin: 12.5 g/dL (ref 12.0–15.0)
Lymphocytes Relative: 14 % (ref 12–46)
Lymphs Abs: 0.7 10*3/uL (ref 0.7–4.0)
MCH: 29.3 pg (ref 26.0–34.0)
MCHC: 31.1 g/dL (ref 30.0–36.0)
MCV: 94.1 fL (ref 78.0–100.0)
Monocytes Absolute: 0.6 10*3/uL (ref 0.1–1.0)
Monocytes Relative: 12 % (ref 3–12)
NEUTROS PCT: 72 % (ref 43–77)
Neutro Abs: 3.5 10*3/uL (ref 1.7–7.7)
Platelets: 142 10*3/uL — ABNORMAL LOW (ref 150–400)
RBC: 4.27 MIL/uL (ref 3.87–5.11)
RDW: 19.8 % — ABNORMAL HIGH (ref 11.5–15.5)
WBC: 4.9 10*3/uL (ref 4.0–10.5)

## 2014-10-25 LAB — TROPONIN I: Troponin I: <0.3 ng/mL (ref <0.30)

## 2014-10-25 LAB — PRO B NATRIURETIC PEPTIDE: Pro B Natriuretic peptide (BNP): 13191 pg/mL — ABNORMAL HIGH (ref 0–450)

## 2014-10-25 MED ORDER — LEVOTHYROXINE SODIUM 75 MCG PO TABS
75.0000 ug | ORAL_TABLET | Freq: Every day | ORAL | Status: DC
  Administered 2014-10-25 – 2014-10-30 (×6): 75 ug via ORAL
  Filled 2014-10-25 (×7): qty 1

## 2014-10-25 MED ORDER — POTASSIUM CHLORIDE CRYS ER 20 MEQ PO TBCR
20.0000 meq | EXTENDED_RELEASE_TABLET | Freq: Two times a day (BID) | ORAL | Status: DC
  Administered 2014-10-25 – 2014-10-27 (×5): 20 meq via ORAL
  Filled 2014-10-25 (×9): qty 1

## 2014-10-25 MED ORDER — APIXABAN 2.5 MG PO TABS
2.5000 mg | ORAL_TABLET | Freq: Two times a day (BID) | ORAL | Status: DC
  Administered 2014-10-25 – 2014-10-30 (×9): 2.5 mg via ORAL
  Filled 2014-10-25 (×11): qty 1

## 2014-10-25 MED ORDER — FERROUS SULFATE 325 (65 FE) MG PO TABS
325.0000 mg | ORAL_TABLET | Freq: Two times a day (BID) | ORAL | Status: DC
  Administered 2014-10-25 – 2014-10-30 (×10): 325 mg via ORAL
  Filled 2014-10-25 (×12): qty 1

## 2014-10-25 MED ORDER — LORATADINE 10 MG PO TABS
10.0000 mg | ORAL_TABLET | Freq: Every day | ORAL | Status: DC | PRN
  Filled 2014-10-25: qty 1

## 2014-10-25 MED ORDER — SIMVASTATIN 10 MG PO TABS
10.0000 mg | ORAL_TABLET | Freq: Every day | ORAL | Status: DC
  Administered 2014-10-25 – 2014-10-29 (×5): 10 mg via ORAL
  Filled 2014-10-25 (×6): qty 1

## 2014-10-25 MED ORDER — SENNA 8.6 MG PO TABS
2.0000 | ORAL_TABLET | Freq: Every day | ORAL | Status: DC | PRN
  Administered 2014-10-27 – 2014-10-29 (×3): 17.2 mg via ORAL
  Filled 2014-10-25 (×4): qty 2

## 2014-10-25 MED ORDER — PANTOPRAZOLE SODIUM 40 MG PO TBEC
40.0000 mg | DELAYED_RELEASE_TABLET | Freq: Every day | ORAL | Status: DC
  Administered 2014-10-25 – 2014-10-30 (×6): 40 mg via ORAL
  Filled 2014-10-25 (×4): qty 1

## 2014-10-25 MED ORDER — FUROSEMIDE 10 MG/ML IJ SOLN
40.0000 mg | INTRAMUSCULAR | Status: AC
  Administered 2014-10-25: 40 mg via INTRAVENOUS
  Filled 2014-10-25: qty 4

## 2014-10-25 MED ORDER — LORAZEPAM 0.5 MG PO TABS
0.2500 mg | ORAL_TABLET | Freq: Three times a day (TID) | ORAL | Status: DC | PRN
  Administered 2014-10-27 – 2014-10-29 (×3): 0.5 mg via ORAL
  Filled 2014-10-25 (×3): qty 1

## 2014-10-25 MED ORDER — NITROGLYCERIN 0.4 MG SL SUBL: 0.4000 mg | SUBLINGUAL_TABLET | SUBLINGUAL | Status: DC | PRN

## 2014-10-25 MED ORDER — METOPROLOL TARTRATE 25 MG PO TABS
25.0000 mg | ORAL_TABLET | Freq: Two times a day (BID) | ORAL | Status: DC
  Filled 2014-10-25 (×3): qty 1

## 2014-10-25 MED ORDER — AMIODARONE HCL 200 MG PO TABS
200.0000 mg | ORAL_TABLET | Freq: Every day | ORAL | Status: DC
  Administered 2014-10-25 – 2014-10-30 (×6): 200 mg via ORAL
  Filled 2014-10-25 (×6): qty 1

## 2014-10-25 MED ORDER — FUROSEMIDE 10 MG/ML IJ SOLN
40.0000 mg | Freq: Two times a day (BID) | INTRAMUSCULAR | Status: DC
  Administered 2014-10-25 – 2014-10-26 (×3): 40 mg via INTRAVENOUS
  Filled 2014-10-25 (×5): qty 4

## 2014-10-25 MED ORDER — MORPHINE SULFATE 4 MG/ML IJ SOLN
2.0000 mg | Freq: Once | INTRAMUSCULAR | Status: AC
  Administered 2014-10-25: 2 mg via INTRAVENOUS
  Filled 2014-10-25: qty 1

## 2014-10-25 MED ORDER — ONDANSETRON HCL 4 MG/2ML IJ SOLN
4.0000 mg | Freq: Once | INTRAMUSCULAR | Status: AC
  Administered 2014-10-25: 4 mg via INTRAVENOUS
  Filled 2014-10-25: qty 2

## 2014-10-25 MED ORDER — ACETAMINOPHEN 325 MG PO TABS
650.0000 mg | ORAL_TABLET | ORAL | Status: DC | PRN
  Administered 2014-10-26 – 2014-10-27 (×2): 650 mg via ORAL
  Filled 2014-10-25 (×3): qty 2

## 2014-10-25 NOTE — ED Notes (Signed)
Patient returned from X-ray 

## 2014-10-25 NOTE — ED Notes (Signed)
Patient transported to X-ray 

## 2014-10-25 NOTE — Progress Notes (Signed)
Patient is sleeping peacefully. Maintained on telemetry and is  AV paced on the monitor with a heart rate of 64 . Will continue to monitor.  Esperanza Heir, RN

## 2014-10-25 NOTE — ED Notes (Signed)
Cardiology at bedside.

## 2014-10-25 NOTE — ED Notes (Signed)
Chest pain starting at 0200; went back to sleep; SOB; nauseated. Dr. Zoila Shutter pt. Called EMS. Heart clinic told her to come here.

## 2014-10-25 NOTE — ED Provider Notes (Signed)
CSN: [id number]     Arrival date & time 10/25/14  6283 History   First MD Initiated Contact with Patient 10/25/14 [phone]     Chief Complaint  Patient presents with  . Chest Pain     (Consider location/radiation/quality/duration/timing/severity/associated sxs/prior Treatment) The history is provided by the patient and medical records.   This is an 80 y.o. F with PMH significant for HTN, hyperlipidemia, anemia, CHF,  AFIB on Eliqiuis, dysrhythmia s/p pacemaker placement, presenting to the ED for chest pain and shortness of breath.  Patient states symptoms began around 0200-0230 which woke her from sleep.  State she was able to go back to sleep but symptoms present upon waking again.  States they have been intermittent since that time, pain localized to her left chest and mid-sternal regions.  SOB worse when pain occurs. Denies significant peripheral edema, weight gain, or orthopnea.  Patient called heart failure clinic this morning but was told to come to the ED.  Patient did not take any of her home NTG PTA.  Most recent 2D echo 02/25/14 with estimated 60-65% EF.  VS stable on arrival.  Past Medical History  Diagnosis Date  . Dyslipidemia   . Hypertension   . Palpitations     PVCs/bigeminy on event in May 2009 revealing this was relatively asymptomatic  . Chest pain     a. 2011 Neg MV;  b. 01/2014 Cath: LM 20-30, LAD nl, D1 nl, LCX nl, OM1 nl, RCA dom 30-59m, PD/PL nl, EF 65%->Med Rx.  . Degenerative joint disease   . Hx of varicella   . History of skin cancer     tafeen/ non melanoma.   . Thrombocytopenia   . Anemia   . Monoclonal gammopathies   . Right lower quadrant abdominal pain 07/19/2012    Recurrent  With nausea    This is new since she had ct for llq pain in [facility]    R/o appendiceal problem  Hernia  Get ct scan  And plan  Fu     . Diastolic CHF, acute on chronic 01/31/2012  . Atrial fibrillation     a. Dx 01/2014->Eliquis started.  . Cancer   . Pacemaker   . Dysrhythmia    Past  Surgical History  Procedure Laterality Date  . Cholecystectomy  1999  . Cesarean section      times 2  . Shoulder surgery  1996    Right   . Cataract extraction      Bilateral  implantt  . Cardioversion N/A 02/26/2014    Procedure: CARDIOVERSION AT BEDSIDE;  Surgeon: Pixie Casino, MD;  Location: [place];  Service: Cardiovascular;  Laterality: N/A;  . Cardioversion N/A 04/22/2014    Procedure: CARDIOVERSION (BEDSIDE);  Surgeon: [name], MD;  Location: [facility] OR;  Service: Cardiovascular;  Laterality: N/A;  . Pacemaker insertion  04/23/2014    STJ Assurity dual chamber pacemaker implanted by Dr Rayann Heman  . Insert / replace / remove pacemaker    . Esophagogastroduodenoscopy N/A 07/06/2014    Procedure: ESOPHAGOGASTRODUODENOSCOPY (EGD);  Surgeon: Ladene Artist, MD;  Location: [facility] ENDOSCOPY;  Service: Endoscopy;  Laterality: N/A;  . Left heart catheterization with coronary angiogram N/A 01/29/2014    Procedure: LEFT HEART CATHETERIZATION WITH CORONARY ANGIOGRAM;  Surgeon: Blane Ohara, MD;  Location: [facility] CATH LAB;  Service: Cardiovascular;  Laterality: N/A;  . Permanent pacemaker insertion N/A 04/23/2014    Procedure: PERMANENT PACEMAKER INSERTION;  Surgeon: Evans Lance, MD;  Location: [facility]  CATH LAB;  Service: Cardiovascular;  Laterality: N/A;  . Right heart catheterization N/A 09/22/2014    Procedure: RIGHT HEART CATH;  Surgeon: Jolaine Artist, MD;  [facility] CATH LAB;  Service: Cardiovascular;  Laterality: N/A;   Family History  Problem Relation Age of Onset  . Coronary artery disease Father   . Heart disease Father   . Lung cancer    . Alzheimer's disease Mother   . Cancer Brother 53    lung cancer  . Heart attack Father    History  Substance Use Topics  . Smoking status: Never Smoker   . Smokeless tobacco: Never Used  . Alcohol Use: No   OB History    No data available     Review of Systems  Respiratory: Positive for shortness of breath.   Cardiovascular: Positive  for chest pain.  All other systems reviewed and are negative.     Allergies  Tramadol  Home Medications   Prior to Admission medications   Medication Sig Start Date End Date Taking? Authorizing Provider  acetaminophen (TYLENOL) 325 MG tablet Take 2 tablets (650 mg total) by mouth every 4 (four) hours as needed for headache or mild pain. 09/26/14   Isaiah Serge, NP  amiodarone (PACERONE) 200 MG tablet Take 1 tablet (200 mg total) by mouth daily. 09/26/14   Isaiah Serge, NP  apixaban (ELIQUIS) 2.5 MG TABS tablet Take 1 tablet (2.5 mg total) by mouth 2 (two) times daily. 05/02/14   Domenic Polite, MD  ferrous sulfate 325 (65 FE) MG tablet Take 1 tablet (325 mg total) by mouth 2 (two) times daily with a meal. 09/09/14   Burnis Medin, MD  levothyroxine (SYNTHROID, LEVOTHROID) 75 MCG tablet Take 1 tablet (75 mcg total) by mouth daily. 01/16/14   Fay Records, MD  loratadine (CLARITIN) 10 MG tablet Take 10 mg by mouth daily as needed for allergies.     Historical Provider, MD  LORazepam (ATIVAN) 0.5 MG tablet Take 0.5-1 tablets (0.25-0.5 mg total) by mouth every 8 (eight) hours as needed for anxiety. 09/09/14   Burnis Medin, MD  metolazone (ZAROXOLYN) 2.5 MG tablet Take 1 tablet (2.5 mg total) by mouth daily as needed. Take only if weight in AM is greater than 144 pounds. 09/26/14   Isaiah Serge, NP  metoprolol tartrate (LOPRESSOR) 25 MG tablet Take 1 tablet (25 mg total) by mouth 2 (two) times daily. 07/12/14   Domenic Polite, MD  nitroGLYCERIN (NITROSTAT) 0.4 MG SL tablet Place 1 tablet (0.4 mg total) under the tongue every 5 (five) minutes as needed. For chest pain Patient taking differently: Place 0.4 mg under the tongue every 5 (five) minutes as needed (MAX 3 TABLETS). For chest pain 12/02/13   Fay Records, MD  pantoprazole (PROTONIX) 40 MG tablet Take 1 tablet (40 mg total) by mouth daily. 08/11/14   Burnis Medin, MD  potassium chloride SA (KLOR-CON M20) 20 MEQ tablet Take 1 tablet (20  mEq total) by mouth 2 (two) times daily. 09/26/14   Isaiah Serge, NP  senna (SENOKOT) 8.6 MG TABS tablet Take 2 tablets (17.2 mg total) by mouth daily as needed for mild constipation. 09/26/14   Isaiah Serge, NP  simvastatin (ZOCOR) 10 MG tablet Take 1 tablet (10 mg total) by mouth at bedtime. 05/29/14   Liliane Shi, PA-C  torsemide (DEMADEX) 20 MG tablet Take 1 tablet (20 mg total) by mouth 2 (two) times daily. 09/26/14  Isaiah Serge, NP   BP 113/62 mmHg  Pulse 78  Temp(Src) 97.8 F (36.6 C) (Oral)  Resp 23  SpO2 98%   Physical Exam  Constitutional: She is oriented to person, place, and time. She appears well-developed and well-nourished. No distress.  HENT:  Head: Normocephalic and atraumatic.  Mouth/Throat: Oropharynx is clear and moist.  Small abrasion to bridge of nose, dried blood present, no nasal tenderness or deformities noted  Eyes: Conjunctivae and EOM are normal. Pupils are equal, round, and reactive to light.  Neck: Normal range of motion. Neck supple.  Cardiovascular: Normal rate, regular rhythm and normal heart sounds.   Pulmonary/Chest: Effort normal and breath sounds normal. No respiratory distress. She has no wheezes.  Abdominal: Soft. Bowel sounds are normal. There is no tenderness. There is no guarding.  Musculoskeletal: Normal range of motion.  Neurological: She is alert and oriented to person, place, and time.  Skin: Skin is warm and dry. She is not diaphoretic.  Psychiatric: She has a normal mood and affect.  Nursing note and vitals reviewed.   ED Course  Procedures (including critical care time) Labs Review Labs Reviewed  CBC WITH DIFFERENTIAL - Abnormal; Notable for the following:    RDW 19.8 (*)    Platelets 142 (*)    All other components within normal limits  BASIC METABOLIC PANEL - Abnormal; Notable for the following:    Glucose, Bld 110 (*)    BUN 32 (*)    Creatinine, Ser 1.93 (*)    GFR calc non Af Amer 22 (*)    GFR calc Af Amer 26  (*)    All other components within normal limits  PRO B NATRIURETIC PEPTIDE - Abnormal; Notable for the following:    Pro B Natriuretic peptide (BNP) 13191.0 (*)    All other components within normal limits  TROPONIN I    Imaging Review Dg Chest 2 View  10/25/2014   CLINICAL DATA:  Left-sided chest pain. Shortness of breath. Atrial fibrillation. Congestive heart failure.  EXAM: CHEST  2 VIEW  COMPARISON:  09/06/2014  FINDINGS: Mild cardiomegaly is stable. Dual lead transvenous pacemaker remains in appropriate position. Graft increased diffuse interstitial infiltrates are seen, consistent with diffuse interstitial edema. No evidence of pulmonary consolidation or pleural effusion.  IMPRESSION: Mild diffuse interstitial edema, consistent with congestive heart failure.   Electronically Signed   By: Earle Gell M.D.   On: 10/25/2014 11:12     EKG Interpretation   Date/Time:  Saturday October 25 2014 09:52:18 EST Ventricular Rate:  60 PR Interval:  169 QRS Duration: 153 QT Interval:  503 QTC Calculation: 503 R Axis:   135 Text Interpretation:  suspect  AV dual-paced complexes Premature  ventricular complexes Baseline wander Confirmed by KOHUT  MD, STEPHEN  ([id number]) on 10/25/2014 10:16:07 AM      MDM   Final diagnoses:  Chest pain  SOB (shortness of breath)  CHF exacerbation   80 year old female with multiple comorbidities, presenting with chest pain and dyspnea.  Followed by cardiology, Dr. Jeffie Pollock.  EKG with paced rhythm.  Will obtain labs, CXR.  Likely admission.  Labwork remarkable for BNP elevated at 13,000. Serum creatinine mildly elevated compared to most recent, however patient does tend to fluctuate between 1.4-3.0 when compared with previous values.  Troponin negative.  CXR with pulmonary edema.  Patient currently requiring 3L supplemental O2.  She will need hospital admission.  Will diurese gently given her SrCr, 40mg  lasix given.  Case discussed  with cardiology who has  evaluated in the ED and will admit for further management.  Larene Pickett, PA-C 10/25/14 1625  Virgel Manifold, MD 10/26/14 [phone]

## 2014-10-25 NOTE — H&P (Addendum)
Primary cardiologist: Consulting cardiologist:  Clinical Summary Ms. Pohlman is a 80 y.o.female hx of non-obstructive CAD by cath 01/2014, afib, DNR status, chronic diastolic HF, HTN, HL, anemia, CKD, recent DCCV for afib with profound bradycardia requiring pacemaker placement, pacemaker placement complicated by acute blood loss and anemia. She is on eliquis for stroke prevention. She is followed in CHF clinic. Has been on home diuretics with goall weights <144, her recent d/c weight 09/2014 141 lbs.   Reports she awoke suddently this AM feeling SOB. No chest pain or palpitations at that time. She stood up and walked around, symptoms resolved. Layed back down with reoccurence of symptoms. Later in the day developed an aching pain right mid back, and then a 4/10 pressure left chest that would last just a few minutes.    Erda 09/22/14 RA 11, PA mean 34, PCWP 18, CI 4 01/2014 Cath diffuse non-obstructive disease 02/2014 echo: LVEF 60-65%, mild LVH, diastolic function not described Device check 09/15/14 normal function, AF 86% of time.  Trop neg x1, pro-BNP 13191, Cr 1.93, GFR 22, K 4.8, Hgb 12.5, Plt 142 CXR mild edema EKG AV pacing   Allergies  Allergen Reactions  . Tramadol Nausea Only    Medications Scheduled Medications:    Infusions:    PRN Medications:        Past Medical History  Diagnosis Date  . Dyslipidemia   . Hypertension   . Palpitations     PVCs/bigeminy on event in May 2009 revealing this was relatively asymptomatic  . Chest pain     a. 2011 Neg MV;  b. 01/2014 Cath: LM 20-30, LAD nl, D1 nl, LCX nl, OM1 nl, RCA dom 30-55m, PD/PL nl, EF 65%->Med Rx.  . Degenerative joint disease   . Hx of varicella   . History of skin cancer     tafeen/ non melanoma.   . Thrombocytopenia   . Anemia   . Monoclonal gammopathies   . Right lower quadrant abdominal pain 07/19/2012    Recurrent  With nausea    This is new since she had ct for llq pain in [facility]    R/o  appendiceal problem  Hernia  Get ct scan  And plan  Fu     . Diastolic CHF, acute on chronic 01/31/2012  . Atrial fibrillation     a. Dx 01/2014->Eliquis started.  . Cancer   . Pacemaker   . Dysrhythmia     Past Surgical History  Procedure Laterality Date  . Cholecystectomy  1999  . Cesarean section      times 2  . Shoulder surgery  1996    Right   . Cataract extraction      Bilateral  implantt  . Cardioversion N/A 02/26/2014    Procedure: CARDIOVERSION AT BEDSIDE;  Surgeon: Pixie Casino, MD;  Location: [place];  Service: Cardiovascular;  Laterality: N/A;  . Cardioversion N/A 04/22/2014    Procedure: CARDIOVERSION (BEDSIDE);  Surgeon: [name], MD;  Location: [facility] OR;  Service: Cardiovascular;  Laterality: N/A;  . Pacemaker insertion  04/23/2014    STJ Assurity dual chamber pacemaker implanted by Dr Rayann Heman  . Insert / replace / remove pacemaker    . Esophagogastroduodenoscopy N/A 07/06/2014    Procedure: ESOPHAGOGASTRODUODENOSCOPY (EGD);  Surgeon: Ladene Artist, MD;  Location: [facility] ENDOSCOPY;  Service: Endoscopy;  Laterality: N/A;  . Left heart catheterization with coronary angiogram N/A 01/29/2014    Procedure: LEFT HEART CATHETERIZATION WITH CORONARY ANGIOGRAM;  Surgeon:  Blane Ohara, MD;  Location: [facility] CATH LAB;  Service: Cardiovascular;  Laterality: N/A;  . Permanent pacemaker insertion N/A 04/23/2014    Procedure: PERMANENT PACEMAKER INSERTION;  Surgeon: Evans Lance, MD;  Location: [facility] CATH LAB;  Service: Cardiovascular;  Laterality: N/A;  . Right heart catheterization N/A 09/22/2014    Procedure: RIGHT HEART CATH;  Surgeon: Jolaine Artist, MD;  Location: [facility] CATH LAB;  Service: Cardiovascular;  Laterality: N/A;    Family History  Problem Relation Age of Onset  . Coronary artery disease Father   . Heart disease Father   . Lung cancer    . Alzheimer's disease Mother   . Cancer Brother 37    lung cancer  . Heart attack Father     Social History Ms. Yin  reports that she has never smoked. She has never used smokeless tobacco. Ms. Guedes reports that she does not drink alcohol.  Review of Systems CONSTITUTIONAL: No weight loss, fever, chills, weakness or fatigue.  HEENT: Eyes: No visual loss, blurred vision, double vision or yellow sclerae. No hearing loss, sneezing, congestion, runny nose or sore throat.  SKIN: No rash or itching.  CARDIOVASCULAR: per HPI RESPIRATORY: No shortness of breath, cough or sputum.  GASTROINTESTINAL: No anorexia, nausea, vomiting or diarrhea. No abdominal pain or blood.  GENITOURINARY: no polyuria, no dysuria NEUROLOGICAL: No headache, dizziness, syncope, paralysis, ataxia, numbness or tingling in the extremities. No change in bowel or bladder control.  MUSCULOSKELETAL: No muscle, back pain, joint pain or stiffness.  HEMATOLOGIC: No anemia, bleeding or bruising.  LYMPHATICS: No enlarged nodes. No history of splenectomy.  PSYCHIATRIC: No history of depression or anxiety.      Physical Examination Blood pressure 96/46, pulse 58, temperature 97.8 F (36.6 C), temperature source Oral, resp. rate 14, SpO2 100 %.  Intake/Output Summary (Last 24 hours) at 10/25/14 1433 Last data filed at 10/25/14 1354  Gross per 24 hour  Intake      0 ml  Output    500 ml  Net   -500 ml    HEENT: sclera clear  Cardiovascular: irreg, 3/6 systolic murmur at apex, JVD with + hepatojugular reflux  Respiratory: crackles bilateral bases  GI: abd soft, NT, ND  MSK: no LE edema  Neuro: no focal deficits  Psych: appropriate affect   Lab Results  Basic Metabolic Panel:  Recent Labs Lab 10/25/14 0955  NA 142  K 4.8  CL 103  CO2 26  GLUCOSE 110*  BUN 32*  CREATININE 1.93*  CALCIUM 10.1    Liver Function Tests: No results for input(s): AST, ALT, ALKPHOS, BILITOT, PROT, ALBUMIN in the last 168 hours.  CBC:  Recent Labs Lab 10/25/14 0955  WBC 4.9  NEUTROABS 3.5  HGB 12.5  HCT 40.2  MCV 94.1  PLT 142*     Cardiac Enzymes:  Recent Labs Lab 10/25/14 0955  TROPONINI <0.30    BNP: Invalid input(s): POCBNP     Impression/Recommendations 1. Acute on chronic diastolic HF - acute onset SOB, DOE, and PND this AM. Evidence of volume overload by exam, labs, and imaging - admit for IV diuresis, Cr elevated suspect due to venous congestion - treat with lasix 40 mg IV bid  2. Afib - currently paced rhythm, no elevation in rates - continue amio and lopressor, apixiban for stroke prevention. Tele monitoring, symptoms could have been potentiated by arrhythmia  3. Chest pain - no evidence of ACS at this time, recent cath 01/2014 without significant CAD -  cycle enzymes and EKG overnight  Carlyle Dolly, M.D.

## 2014-10-25 NOTE — Progress Notes (Signed)
Patient's BP hypo-tensive at 81/37. Repeat of BP manually was 90/58. Patient is asymptomatic and reports that she feels fine. 2200 dose of Metoporlol  held. Will continue to monitor.  Esperanza Heir, RN

## 2014-10-25 NOTE — Telephone Encounter (Signed)
  80 y.o. female with a hx of nonobstructive CAD by cardiac catheterization in 01/2014, persistent atrial fibrillation, diastolic CHF, HTN, HL and CKD, calling after hours line complaining of acute onset of dyspnea, chest pressure and left arm tingling that awoke her from her sleep. She has never felt this way before. She reports full medication compliance. She has mild dizziness, but no syncope/ near syncope. Pt advised to come to ER for evaluation/ assessment. ? Afib w/ RVR vs ACS vs Acute/chronic CHF exacerbation. Patient is in agreement with plan and daughter is calling EMS.   Elson Ulbrich, Silas Flood 10/25/2014

## 2014-10-26 LAB — CBC
HEMATOCRIT: 37.6 % (ref 36.0–46.0)
HEMOGLOBIN: 11.7 g/dL — AB (ref 12.0–15.0)
MCH: 30.2 pg (ref 26.0–34.0)
MCHC: 31.1 g/dL (ref 30.0–36.0)
MCV: 96.9 fL (ref 78.0–100.0)
Platelets: 126 10*3/uL — ABNORMAL LOW (ref 150–400)
RBC: 3.88 MIL/uL (ref 3.87–5.11)
RDW: 19.6 % — AB (ref 11.5–15.5)
WBC: 4.1 10*3/uL (ref 4.0–10.5)

## 2014-10-26 LAB — COMPREHENSIVE METABOLIC PANEL
ALBUMIN: 2.8 g/dL — AB (ref 3.5–5.2)
ALK PHOS: 89 U/L (ref 39–117)
ALT: 14 U/L (ref 0–35)
AST: 19 U/L (ref 0–37)
Anion gap: 12 (ref 5–15)
BILIRUBIN TOTAL: 0.9 mg/dL (ref 0.3–1.2)
BUN: 28 mg/dL — ABNORMAL HIGH (ref 6–23)
CHLORIDE: 102 meq/L (ref 96–112)
CO2: 29 meq/L (ref 19–32)
CREATININE: 1.73 mg/dL — AB (ref 0.50–1.10)
Calcium: 9.6 mg/dL (ref 8.4–10.5)
GFR calc Af Amer: 29 mL/min — ABNORMAL LOW (ref >90)
GFR, EST NON AFRICAN AMERICAN: 25 mL/min — AB (ref >90)
Glucose, Bld: 98 mg/dL (ref 70–99)
POTASSIUM: 4 meq/L (ref 3.7–5.3)
Sodium: 143 mEq/L (ref 137–147)
Total Protein: 6.2 g/dL (ref 6.0–8.3)

## 2014-10-26 LAB — TROPONIN I: Troponin I: <0.3 ng/mL (ref <0.30)

## 2014-10-26 LAB — MAGNESIUM: Magnesium: 2.3 mg/dL (ref 1.5–2.5)

## 2014-10-26 MED ORDER — METOPROLOL TARTRATE 12.5 MG HALF TABLET
12.5000 mg | ORAL_TABLET | Freq: Two times a day (BID) | ORAL | Status: DC
  Filled 2014-10-26 (×4): qty 1

## 2014-10-26 NOTE — Progress Notes (Signed)
Patient sleeping peacefully. Maintained on the monitor and is A?V paced with a heart rate of 60. Will continue to monitor.  Esperanza Heir, RN

## 2014-10-26 NOTE — Progress Notes (Signed)
Received to unit and initial vital signs and weights gotten.  Client has blood from a scratch on bridge of nose that has dried.  Alert and oriented.  Orders released.  Placed on the monitor and the telemetry monitors notified.  Admission started and to be completed by the oncoming nurse.

## 2014-10-26 NOTE — Progress Notes (Signed)
Patient ID: [name], female   DOB: 06-22-1925, 80 y.o.   MRN: [id number]    Subjective:    SOB resolved, Chest pain resolved.   Objective:   Temp:  [97.7 F (36.5 C)-98 F (36.7 C)] 97.8 F (36.6 C) (12/13 0530) Pulse Rate:  [51-91] 91 (12/13 0530) Resp:  [14-32] 17 (12/13 0530) BP: (72-128)/(32-74) 91/37 mmHg (12/13 0530) SpO2:  [84 %-100 %] 94 % (12/13 0530) Weight:  [134 lb 4.8 oz (60.918 kg)-134 lb 12.8 oz (61.145 kg)] 134 lb 4.8 oz (60.918 kg) (12/13 0530) Last BM Date: 10/23/14  Filed Weights   10/25/14 1650 10/26/14 0530  Weight: 134 lb 12.8 oz (61.145 kg) 134 lb 4.8 oz (60.918 kg)    Intake/Output Summary (Last 24 hours) at 10/26/14 0903 Last data filed at 10/26/14 0900  Gross per 24 hour  Intake    360 ml  Output   1450 ml  Net  -1090 ml    Telemetry: AV pacing  Exam:  General: NAD  Resp:faint crackles bilateral bases  Cardiac: RRR, no m/r/g, no JVD  GI: abdomen soft, NT, ND  MSK:no LE edema  Neuro: no focal deficits    Lab Results:  Basic Metabolic Panel:  Recent Labs Lab 10/25/14 0955 10/26/14 0302  NA 142 143  K 4.8 4.0  CL 103 102  CO2 26 29  GLUCOSE 110* 98  BUN 32* 28*  CREATININE 1.93* 1.73*  CALCIUM 10.1 9.6  MG  --  2.3    Liver Function Tests:  Recent Labs Lab 10/26/14 0302  AST 19  ALT 14  ALKPHOS 89  BILITOT 0.9  PROT 6.2  ALBUMIN 2.8*    CBC:  Recent Labs Lab 10/25/14 0955 10/26/14 0302  WBC 4.9 4.1  HGB 12.5 11.7*  HCT 40.2 37.6  MCV 94.1 96.9  PLT 142* 126*    Cardiac Enzymes:  Recent Labs Lab 10/25/14 1545 10/25/14 2150 10/26/14 0302  TROPONINI <0.30 <0.30 <0.30    BNP:  Recent Labs  08/27/14 1147 09/18/14 1317 10/25/14 0955  PROBNP 914.0* 1874.0* 13191.0*    Coagulation: No results for input(s): INR in the last 168 hours.  ECG:   Medications:   Scheduled Medications: . amiodarone  200 mg Oral Daily  . apixaban  2.5 mg Oral BID  . ferrous sulfate  325 mg Oral  BID WC  . furosemide  40 mg Intravenous BID  . levothyroxine  75 mcg Oral QAC breakfast  . metoprolol tartrate  25 mg Oral BID  . pantoprazole  40 mg Oral Daily  . potassium chloride SA  20 mEq Oral BID  . simvastatin  10 mg Oral QHS     Infusions:     PRN Medications:  acetaminophen, loratadine, LORazepam, nitroGLYCERIN, senna     Assessment/Plan    1. Acute on chronic diastolic HF - acute onset SOB, DOE, and PND this AM. Evidence of volume overload by exam, labs, and imaging - on lasix 40mg  IV bid, negative 1.2 liters overnight. Cr and BUN trending down with diuresis consistent with venous congestion and CHF. Symptoms resolved with diuresis. Some soft bp's, very low by electronic cuff but by my manual check 100/50. Will stop IV lasix (she has received 40mg  IV x1 this AM) and decrease lopressor to 12.5mg  bid.    2. Afib - continue amio and lopressor, apixiban for stroke prevention.   3. Chest pain - no evidence of ACS at this time, recent cath 01/2014 without significant CAD -  cycle enzymes and EKG overnight  4. Hypotension - intermittent low bp by electronic cuff, some readings reported at 90/30s. On my manual check bp 100/50, she denies any symptoms - will d/c IV lasix, she has received one dose this AM. Decrease lopressor to 12.5mg  bid.      Carlyle Dolly, M.D.

## 2014-10-26 NOTE — Plan of Care (Addendum)
Problem: Phase I Progression Outcomes Goal: Up in chair, BRP Outcome: Progressing Tolerating sitting up on side of bed.  No complaints of discomfort at this time.    12:33 Dr. Harl Bowie aware that BP was low and metoprolol was not given.

## 2014-10-27 DIAGNOSIS — I959 Hypotension, unspecified: Secondary | ICD-10-CM

## 2014-10-27 DIAGNOSIS — I480 Paroxysmal atrial fibrillation: Secondary | ICD-10-CM

## 2014-10-27 DIAGNOSIS — R001 Bradycardia, unspecified: Secondary | ICD-10-CM

## 2014-10-27 DIAGNOSIS — N184 Chronic kidney disease, stage 4 (severe): Secondary | ICD-10-CM

## 2014-10-27 DIAGNOSIS — Z950 Presence of cardiac pacemaker: Secondary | ICD-10-CM

## 2014-10-27 LAB — BASIC METABOLIC PANEL
Anion gap: 10 (ref 5–15)
BUN: 26 mg/dL — AB (ref 6–23)
CO2: 29 mEq/L (ref 19–32)
CREATININE: 1.82 mg/dL — AB (ref 0.50–1.10)
Calcium: 9.4 mg/dL (ref 8.4–10.5)
Chloride: 100 mEq/L (ref 96–112)
GFR calc Af Amer: 28 mL/min — ABNORMAL LOW (ref >90)
GFR, EST NON AFRICAN AMERICAN: 24 mL/min — AB (ref >90)
GLUCOSE: 90 mg/dL (ref 70–99)
POTASSIUM: 4.4 meq/L (ref 3.7–5.3)
Sodium: 139 mEq/L (ref 137–147)

## 2014-10-27 LAB — MAGNESIUM: Magnesium: 2.2 mg/dL (ref 1.5–2.5)

## 2014-10-27 NOTE — Clinical Documentation Improvement (Signed)
Supporting Information: Patient with a history CKD noted per 12/12 progress notes.  Labs: GFR: 12/14: 24 12/13: 25 12/12: 22   Possible Diagnosis? . Document the stage of CKD --Chronic kidney disease, stage 1- GFR > OR = 90 --Chronic kidney disease, stage 2 (mild) - GFR 60-89 --Chronic kidney disease, stage 3 (moderate) - GFR 30-59 --Chronic kidney disease, stage 4 (severe) - GFR 15-29 --Chronic kidney disease, stage 5- GFR < 15 --End-stage renal disease (ESRD) . Document any underlying cause of CKD such as Diabetes or Hypertension . Document any associated diagnoses/conditions    Thank Sherian Maroon Documentation Specialist [phone] Azriel [email or web address]

## 2014-10-27 NOTE — Care Management Note (Signed)
    Page 1 of 2   10/30/2014     2:52:26 PM CARE MANAGEMENT NOTE 10/30/2014  Patient:  [name], [name]   Account Number:  [id number]  Date Initiated:  10/27/2014  Documentation initiated by:  Dakota Vanwart  Subjective/Objective Assessment:   Pt adm on 10/25/14 with CHF.  PTA, pt resided at home alone, and is independent.  She is active with [facility] for HHRN/PT/OT.  Pt is followed by [facility] prior to admission.     Action/Plan:   Will follow for dc needs as pt progresses.  Will need resumption orders for [facility] care to continue at home.  Please leave orders prior to dc.   Anticipated DC Date:  10/30/2014   Anticipated DC Plan:  [place]  CM consult      [facility] Choice  HOME HEALTH  Resumption Of Svcs/PTA Provider   Choice offered to / List presented to:  C-1 Patient        [place] arranged  [place] agency  [facility]   Status of service:  Completed, signed off Medicare Important Message given?  YES (If response is "NO", the following Medicare IM given date fields will be blank) Date Medicare IM given:  10/29/2014 Medicare IM given by:  Steffen Hase Date Additional Medicare IM given:   Additional Medicare IM given by:    Discharge Disposition:  [place]  Per UR Regulation:  Reviewed for med. necessity/level of care/duration of stay  If discussed at [place] of Stay Meetings, dates discussed:   10/30/2014    Comments:  10/30/14 Ellan Lambert, RN, BSN [phone] Pt for dc home today.  Will resume [place] services as prior to admission with [facility].  Notified [facility] of dc date for today and resumption orders for [facility] services.

## 2014-10-27 NOTE — Progress Notes (Signed)
Patient Name: [name] Date of Encounter: 10/27/2014     Active Problems:   Acute on chronic diastolic congestive heart failure    SUBJECTIVE  Denies any SOB and CP. Pacemaker placed in R pectoral in Aug, initially tried L however significant bleeding on eliquis  CURRENT MEDS . amiodarone  200 mg Oral Daily  . apixaban  2.5 mg Oral BID  . ferrous sulfate  325 mg Oral BID WC  . furosemide  40 mg Intravenous BID  . levothyroxine  75 mcg Oral QAC breakfast  . metoprolol tartrate  12.5 mg Oral BID  . pantoprazole  40 mg Oral Daily  . potassium chloride SA  20 mEq Oral BID  . simvastatin  10 mg Oral QHS    OBJECTIVE  Filed Vitals:   10/26/14 1022 10/26/14 1450 10/26/14 2124 10/27/14 0505  BP: 85/33 91/74 85/65  80/39  Pulse: 61 60 55 60  Temp: 97.7 F (36.5 C) 98 F (36.7 C) 98.2 F (36.8 C) 98.4 F (36.9 C)  TempSrc: Oral Oral Oral Oral  Resp: 18 18 17 16   Height:      Weight:    134 lb 14.7 oz (61.2 kg)  SpO2: 100% 100% 95% 97%    Intake/Output Summary (Last 24 hours) at 10/27/14 0848 Last data filed at 10/27/14 0600  Gross per 24 hour  Intake    720 ml  Output   1150 ml  Net   -430 ml   Filed Weights   10/25/14 1650 10/26/14 0530 10/27/14 0505  Weight: 134 lb 12.8 oz (61.145 kg) 134 lb 4.8 oz (60.918 kg) 134 lb 14.7 oz (61.2 kg)    PHYSICAL EXAM  General: Pleasant, NAD. Neuro: Alert and oriented X 3. Moves all extremities spontaneously. Psych: Normal affect. HEENT:  Normal  Neck: Supple without bruits or JVD. Lungs:  Resp regular and unlabored, fine rale in L base, ?atelectasis, otherwise CTA. Pacemaker noted in R pectoral site, healing well. Heart: RRR no s3, s4, or murmurs. Abdomen: Soft, non-tender, non-distended, BS + x 4.  Extremities: No clubbing, cyanosis or edema. DP/PT/Radials 2+ and equal bilaterally.  Accessory Clinical Findings  CBC  Recent Labs  10/25/14 0955 10/26/14 0302  WBC 4.9 4.1  NEUTROABS 3.5  --   HGB 12.5  11.7*  HCT 40.2 37.6  MCV 94.1 96.9  PLT 142* 578*   Basic Metabolic Panel  Recent Labs  10/26/14 0302 10/27/14 0345  NA 143 139  K 4.0 4.4  CL 102 100  CO2 29 29  GLUCOSE 98 90  BUN 28* 26*  CREATININE 1.73* 1.82*  CALCIUM 9.6 9.4  MG 2.3 2.2   Liver Function Tests  Recent Labs  10/26/14 0302  AST 19  ALT 14  ALKPHOS 89  BILITOT 0.9  PROT 6.2  ALBUMIN 2.8*   Cardiac Enzymes  Recent Labs  10/25/14 1545 10/25/14 2150 10/26/14 0302  TROPONINI <0.30 <0.30 <0.30    TELE V-paced rhtyhm with HR 60s    ECG  No new EKG  Echocardiogram 02/25/2014  LV EF: 60% -  65%  ------------------------------------------------------------ Indications:   CHF - 428.0.  ------------------------------------------------------------ History:  PMH: Hypothyroidism. Chronic atrial fibrillation. Hyperglycemia. Non-ST elevated myocardial infarction. Coronary artery disease. Risk factors: Hypertension.  ------------------------------------------------------------ Study Conclusions  - Left ventricle: The cavity size was normal. Wall thickness was increased in a pattern of mild LVH. There was focal basal hypertrophy. Systolic function was normal. The estimated ejection fraction was in the range of  60% to 65%. Wall motion was normal; there were no regional wall motion abnormalities. - Left atrium: The atrium was mildly dilated. - Right atrium: The atrium was mildly dilated. - Pericardium, extracardiac: A trivial pericardial effusion was identified.    Radiology/Studies  Dg Chest 2 View  10/25/2014   CLINICAL DATA:  Left-sided chest pain. Shortness of breath. Atrial fibrillation. Congestive heart failure.  EXAM: CHEST  2 VIEW  COMPARISON:  09/06/2014  FINDINGS: Mild cardiomegaly is stable. Dual lead transvenous pacemaker remains in appropriate position. Graft increased diffuse interstitial infiltrates are seen, consistent with diffuse interstitial  edema. No evidence of pulmonary consolidation or pleural effusion.  IMPRESSION: Mild diffuse interstitial edema, consistent with congestive heart failure.   Electronically Signed   By: Earle Gell M.D.   On: 10/25/2014 11:12    ASSESSMENT AND PLAN  1. Acute on chronic diastolic HF  - RHC 93/03/7016 PCW 18, RV 49/6/10  - continue to have persistent hypotension with BP 80s, metoprolol cut back down to 12.5 mg BID, will stop IV lasix for now (per dr. Harl Bowie, suppose to have stopped yesterday, however it did not appear have been stopped)  - potentially restart demadex tomorrow. PT consult  2. A-fib on amiodarone and eliquis 3. Chest pain 4. Hypotension 5. CKD: stage IV 6. Bradycardia s/p PPM 7. Nonobstructive CAD by cath 01/2014   SignedAlmyra Deforest PA-C Pager: [phone] Patient seen and examined and history reviewed. Agree with above findings and plan. Patient complains of SOB still. No cough. No chest pain or dizziness. She is now hypotensive. She is AV paced. She has diuresed 1 liter since admit. Weight is stable. No edema on exam and lungs are clear. Will need to hold lopressor and lasix today due to hypotension. May be able to resume demadex tomorrow if better. PT.  Jessi Jessop [place], [place] 10/27/2014 9:41 AM

## 2014-10-28 DIAGNOSIS — I482 Chronic atrial fibrillation: Secondary | ICD-10-CM

## 2014-10-28 LAB — BASIC METABOLIC PANEL
Anion gap: 15 (ref 5–15)
BUN: 23 mg/dL (ref 6–23)
CO2: 21 meq/L (ref 19–32)
Calcium: 9.7 mg/dL (ref 8.4–10.5)
Chloride: 107 mEq/L (ref 96–112)
Creatinine, Ser: 1.64 mg/dL — ABNORMAL HIGH (ref 0.50–1.10)
GFR calc Af Amer: 31 mL/min — ABNORMAL LOW (ref >90)
GFR, EST NON AFRICAN AMERICAN: 27 mL/min — AB (ref >90)
Glucose, Bld: 81 mg/dL (ref 70–99)
Potassium: 5.4 mEq/L — ABNORMAL HIGH (ref 3.7–5.3)
SODIUM: 143 meq/L (ref 137–147)

## 2014-10-28 LAB — MAGNESIUM: MAGNESIUM: 2.5 mg/dL (ref 1.5–2.5)

## 2014-10-28 MED ORDER — ONDANSETRON HCL 4 MG PO TABS: 4.0000 mg | ORAL_TABLET | Freq: Once | ORAL | Status: DC

## 2014-10-28 MED ORDER — TORSEMIDE 20 MG PO TABS
20.0000 mg | ORAL_TABLET | Freq: Two times a day (BID) | ORAL | Status: DC
  Administered 2014-10-28 – 2014-10-29 (×3): 20 mg via ORAL
  Filled 2014-10-28 (×5): qty 1

## 2014-10-28 NOTE — Evaluation (Signed)
Physical Therapy Evaluation Patient Details Name: [name] MRN: [id number] DOB: Jul 12, 1925 Today's Date: 10/28/2014   History of Present Illness  Pt is an 80 y.o.female with a PMH of non-obstructive CAD by cath 01/2014, afib, DNR status, chronic diastolic HF, HTN, HL, anemia, CKD, recent DCCV for afib with profound bradycardia requiring pacemaker placement, pacemaker placement complicated by acute blood loss and anemia. Reports she awoke suddently this AM feeling SOB. No chest pain or palpitations at that time. She stood up and walked around, symptoms resolved. Layed back down with reoccurence of symptoms. Later in the day developed an aching pain right mid back, and then a 4/10 pressure left chest that would last just a few minutes. Pt was admitted with acute on chronic diastolic congestive heart failure.  Clinical Impression  Pt admitted with above diagnosis. Pt currently with functional limitations due to the deficits listed below (see PT Problem List). At the time of PT eval pt was able to perform transfers with min guard assist and ambulation with min assist. Pt reports at baseline she uses her rollator and takes supplemental O2 with her when she is out in the community. Pt will have 24 hour assist available to her at home for at least the first few days, and PT is recommending resuming HHPT, which pt was getting prior to admission. Pt will benefit from skilled PT to increase their independence and safety with mobility to allow discharge to the venue listed below.       Follow Up Recommendations Home health PT;Supervision/Assistance - 24 hour    Equipment Recommendations  None recommended by PT    Recommendations for Other Services       Precautions / Restrictions Precautions Precautions: Fall Restrictions Weight Bearing Restrictions: No      Mobility  Bed Mobility               General bed mobility comments: Pt sitting on EOB when PT arrived.  Transfers Overall  transfer level: Needs assistance Equipment used: None Transfers: Sit to/from Stand Sit to Stand: Min guard         General transfer comment: Pt was able to power-up to full standing position with no unsteadiness or LOB noted.   Ambulation/Gait Ambulation/Gait assistance: Min assist Ambulation Distance (Feet): 125 Feet Assistive device: 1 person hand held assist Gait Pattern/deviations: Step-through pattern;Decreased stride length;Trunk flexed;Trendelenburg Gait velocity: Decreased Gait velocity interpretation: Below normal speed for age/gender General Gait Details: Pt was able to ambulate with HHA (min assist through therapist's hand) for balance and safety. Pt with difficulty improving posture, and demonstrates a significant forward head posture due to kyphosis.   Stairs            Wheelchair Mobility    Modified Rankin (Stroke Patients Only)       Balance Overall balance assessment: Needs assistance Sitting-balance support: Feet supported;No upper extremity supported Sitting balance-Leahy Scale: Fair     Standing balance support: Single extremity supported;During functional activity Standing balance-Leahy Scale: Poor Standing balance comment: Requires assist for balance.                              Pertinent Vitals/Pain Pain Assessment: No/denies pain    Home Living Family/patient expects to be discharged to:: Private residence Living Arrangements: Alone Available Help at Discharge: Family;Available PRN/intermittently Type of Home: House Home Access: Stairs to enter Entrance Stairs-Rails: Can reach both;Left;Right Entrance Stairs-Number of Steps: 4 Home Layout:  One level Home Equipment: [place] - 4 wheels;Cane - single point;Grab bars - toilet;Grab bars - tub/shower Additional Comments: Pt's daughter checks on her frequently during the day. Lives only a few minutes away.    Prior Function Level of Independence: Independent with assistive  device(s)               Hand Dominance   Dominant Hand: Right    Extremity/Trunk Assessment   Upper Extremity Assessment: Defer to OT evaluation           Lower Extremity Assessment: Generalized weakness      Cervical / Trunk Assessment: Kyphotic;Other exceptions  Communication   Communication: HOH  Cognition Arousal/Alertness: Awake/alert Behavior During Therapy: WFL for tasks assessed/performed Overall Cognitive Status: Within Functional Limits for tasks assessed                      General Comments      Exercises        Assessment/Plan    PT Assessment Patient needs continued PT services  PT Diagnosis Difficulty walking;Generalized weakness   PT Problem List Decreased strength;Decreased range of motion;Decreased activity tolerance;Decreased balance;Decreased mobility;Decreased knowledge of use of DME;Decreased safety awareness;Decreased knowledge of precautions;Cardiopulmonary status limiting activity  PT Treatment Interventions DME instruction;Gait training;Stair training;Functional mobility training;Therapeutic activities;Therapeutic exercise;Neuromuscular re-education;Patient/family education   PT Goals (Current goals can be found in the Care Plan section) Acute Rehab PT Goals Patient Stated Goal: Return home PT Goal Formulation: With patient Time For Goal Achievement: 11/04/14 Potential to Achieve Goals: Good    Frequency Min 3X/week   Barriers to discharge        Co-evaluation               End of Session Equipment Utilized During Treatment: Gait belt;Oxygen Activity Tolerance: Patient tolerated treatment well Patient left: in chair;with chair alarm set;with call bell/phone within reach Nurse Communication: Mobility status         Time: 9476-5465 PT Time Calculation (min) (ACUTE ONLY): 25 min   Charges:   PT Evaluation $Initial PT Evaluation Tier I: 1 Procedure PT Treatments $Gait Training: 8-22 mins $Therapeutic  Activity: 8-22 mins   PT G Codes:          Rolinda Roan 10/28/2014, 11:59 AM  Rolinda Roan, PT, DPT Acute Rehabilitation Services Pager: [phone]

## 2014-10-28 NOTE — Progress Notes (Signed)
Patient Name: RHENDA OREGON Date of Encounter: 10/28/2014     Active Problems:   Acute on chronic diastolic congestive heart failure    SUBJECTIVE  States she has some SOB associated with anxiety last night relieved with ativan. Significant nausea last night when she had her Iron tablet on empty stomach, since then she's been having nausea. Denies any CP.   CURRENT MEDS . amiodarone  200 mg Oral Daily  . apixaban  2.5 mg Oral BID  . ferrous sulfate  325 mg Oral BID WC  . levothyroxine  75 mcg Oral QAC breakfast  . pantoprazole  40 mg Oral Daily  . potassium chloride SA  20 mEq Oral BID  . simvastatin  10 mg Oral QHS    OBJECTIVE  Filed Vitals:   10/27/14 0505 10/27/14 1402 10/27/14 2101 10/28/14 0628  BP: 80/39 87/65 112/49 98/50  Pulse: 60 59 59 59  Temp: 98.4 F (36.9 C) 98.4 F (36.9 C) 97.8 F (36.6 C) 97.7 F (36.5 C)  TempSrc: Oral Oral Oral Oral  Resp: 16 16 18 18   Height:      Weight: 134 lb 14.7 oz (61.2 kg)   136 lb 4.8 oz (61.825 kg)  SpO2: 97% 99% 100% 95%    Intake/Output Summary (Last 24 hours) at 10/28/14 0846 Last data filed at 10/28/14 0651  Gross per 24 hour  Intake   1900 ml  Output    700 ml  Net   1200 ml   Filed Weights   10/26/14 0530 10/27/14 0505 10/28/14 0628  Weight: 134 lb 4.8 oz (60.918 kg) 134 lb 14.7 oz (61.2 kg) 136 lb 4.8 oz (61.825 kg)    PHYSICAL EXAM  General: Pleasant, NAD. Neuro: Alert and oriented X 3. Moves all extremities spontaneously. Psych: Normal affect. HEENT:  Normal  Neck: Supple without bruits or JVD. Lungs:  Resp regular and unlabored, bibasilar rale, more pronounced in L base Heart: RRR no s3, s4, or murmurs. Abdomen: Soft, non-tender, non-distended, BS + x 4.  Extremities: No clubbing, cyanosis or edema. DP/PT/Radials 2+ and equal bilaterally.  Accessory Clinical Findings  CBC  Recent Labs  10/25/14 0955 10/26/14 0302  WBC 4.9 4.1  NEUTROABS 3.5  --   HGB 12.5 11.7*  HCT 40.2 37.6   MCV 94.1 96.9  PLT 142* 712*   Basic Metabolic Panel  Recent Labs  10/27/14 0345 10/28/14 0620  NA 139 143  K 4.4 5.4*  CL 100 107  CO2 29 21  GLUCOSE 90 81  BUN 26* 23  CREATININE 1.82* 1.64*  CALCIUM 9.4 9.7  MG 2.2 2.5   Liver Function Tests  Recent Labs  10/26/14 0302  AST 19  ALT 14  ALKPHOS 89  BILITOT 0.9  PROT 6.2  ALBUMIN 2.8*   Cardiac Enzymes  Recent Labs  10/25/14 1545 10/25/14 2150 10/26/14 0302  TROPONINI <0.30 <0.30 <0.30    TELE AV paced rhythm    ECG  No new EKG  Echocardiogram 02/25/2014  LV EF: 60% -  65%  ------------------------------------------------------------ Indications:   CHF - 428.0.  ------------------------------------------------------------ History:  PMH: Hypothyroidism. Chronic atrial fibrillation. Hyperglycemia. Non-ST elevated myocardial infarction. Coronary artery disease. Risk factors: Hypertension.  ------------------------------------------------------------ Study Conclusions  - Left ventricle: The cavity size was normal. Wall thickness was increased in a pattern of mild LVH. There was focal basal hypertrophy. Systolic function was normal. The estimated ejection fraction was in the range of 60% to 65%. Wall motion was normal;  there were no regional wall motion abnormalities. - Left atrium: The atrium was mildly dilated. - Right atrium: The atrium was mildly dilated. - Pericardium, extracardiac: A trivial pericardial effusion was identified.    Radiology/Studies  Dg Chest 2 View  10/25/2014   CLINICAL DATA:  Left-sided chest pain. Shortness of breath. Atrial fibrillation. Congestive heart failure.  EXAM: CHEST  2 VIEW  COMPARISON:  09/06/2014  FINDINGS: Mild cardiomegaly is stable. Dual lead transvenous pacemaker remains in appropriate position. Graft increased diffuse interstitial infiltrates are seen, consistent with diffuse interstitial edema. No evidence of pulmonary  consolidation or pleural effusion.  IMPRESSION: Mild diffuse interstitial edema, consistent with congestive heart failure.   Electronically Signed   By: Earle Gell M.D.   On: 10/25/2014 11:12    ASSESSMENT AND PLAN  1. Acute on chronic diastolic HF - RHC 53/04/1442 PCW 18, RV 49/6/10 - lasix and BB held as patient had significant hypotension preventing further diuresis. BP improving, however net fluid level -418ml now down from -1.6L  - Continue to be frail, PT to see today  - BP 90-110s, consider reinitiate dexmadex 20mg  BID. Continue hold metoprolol. Zofran for nausea associated with eating Iron pill on empty stomach  2. A-fib on amiodarone and eliquis 3. Chest pain 4. Hypotension 5. CKD: stage IV 6. Bradycardia s/p PPM 7. Nonobstructive CAD by cath 01/2014  SignedAlmyra Deforest PA-C Pager: [phone] Patient seen and examined and history reviewed. Agree with above findings and plan. Patient complains of nausea since taking iron on an empty stomach last night. Some increased dyspnea last PM. Weight is increased. Fluid intake 1900cc. BP has improved. Will resume demadex po today. Restrict fluid intake to 1500 cc/day. Continue to hold metoprolol. PT evaluation.  Andris Brothers [place], [place] 10/28/2014 9:21 AM

## 2014-10-28 NOTE — Progress Notes (Signed)
UR completed Greene Diodato K. Erik Nessel, RN, BSN, [place], CCM  10/28/2014 3:05 PM

## 2014-10-28 NOTE — Progress Notes (Signed)
No change from previous assessment by RN.

## 2014-10-29 DIAGNOSIS — R5381 Other malaise: Secondary | ICD-10-CM

## 2014-10-29 DIAGNOSIS — I952 Hypotension due to drugs: Secondary | ICD-10-CM

## 2014-10-29 DIAGNOSIS — N183 Chronic kidney disease, stage 3 (moderate): Secondary | ICD-10-CM

## 2014-10-29 LAB — MAGNESIUM: MAGNESIUM: 2.2 mg/dL (ref 1.5–2.5)

## 2014-10-29 LAB — BASIC METABOLIC PANEL
Anion gap: 11 (ref 5–15)
BUN: 22 mg/dL (ref 6–23)
CHLORIDE: 104 meq/L (ref 96–112)
CO2: 28 meq/L (ref 19–32)
Calcium: 9.5 mg/dL (ref 8.4–10.5)
Creatinine, Ser: 1.5 mg/dL — ABNORMAL HIGH (ref 0.50–1.10)
GFR calc Af Amer: 35 mL/min — ABNORMAL LOW (ref >90)
GFR calc non Af Amer: 30 mL/min — ABNORMAL LOW (ref >90)
Glucose, Bld: 90 mg/dL (ref 70–99)
Potassium: 4 mEq/L (ref 3.7–5.3)
Sodium: 143 mEq/L (ref 137–147)

## 2014-10-29 MED ORDER — POLYETHYLENE GLYCOL 3350 17 G PO PACK
17.0000 g | PACK | Freq: Every day | ORAL | Status: DC | PRN
  Filled 2014-10-29: qty 1

## 2014-10-29 MED ORDER — FLEET ENEMA 7-19 GM/118ML RE ENEM
1.0000 | ENEMA | Freq: Once | RECTAL | Status: AC
  Administered 2014-10-29: 1 via RECTAL
  Filled 2014-10-29: qty 1

## 2014-10-29 MED ORDER — TORSEMIDE 20 MG PO TABS
40.0000 mg | ORAL_TABLET | Freq: Two times a day (BID) | ORAL | Status: DC
  Administered 2014-10-29 – 2014-10-30 (×2): 40 mg via ORAL
  Filled 2014-10-29 (×3): qty 2

## 2014-10-29 NOTE — Progress Notes (Signed)
Patient Name: [name] Date of Encounter: 10/29/2014     Active Problems:   Acute on chronic diastolic congestive heart failure    SUBJECTIVE  States breathing is better. Complains of constipation. Denies any CP.   CURRENT MEDS . amiodarone  200 mg Oral Daily  . apixaban  2.5 mg Oral BID  . ferrous sulfate  325 mg Oral BID WC  . levothyroxine  75 mcg Oral QAC breakfast  . ondansetron  4 mg Oral Once  . pantoprazole  40 mg Oral Daily  . simvastatin  10 mg Oral QHS  . torsemide  20 mg Oral BID    OBJECTIVE  Filed Vitals:   10/28/14 1445 10/28/14 2011 10/29/14 0550 10/29/14 1050  BP: 108/45 108/50 103/48 107/54  Pulse: 62 60 60 54  Temp: 97.9 F (36.6 C) 97.8 F (36.6 C) 97.9 F (36.6 C)   TempSrc: Oral Oral Oral   Resp: 16 16 18 18   Height:      Weight:   134 lb 12.8 oz (61.145 kg)   SpO2: 98% 97% 96% 99%    Intake/Output Summary (Last 24 hours) at 10/29/14 1136 Last data filed at 10/29/14 1000  Gross per 24 hour  Intake   1540 ml  Output    951 ml  Net    589 ml   Filed Weights   10/27/14 0505 10/28/14 0628 10/29/14 0550  Weight: 134 lb 14.7 oz (61.2 kg) 136 lb 4.8 oz (61.825 kg) 134 lb 12.8 oz (61.145 kg)    PHYSICAL EXAM  General: Pleasant, NAD. Neuro: Alert and oriented X 3. Moves all extremities spontaneously. Psych: Normal affect. HEENT:  Normal  Neck: Supple without bruits or JVD. Lungs:  Resp regular and unlabored, bibasilar rales Heart: RRR no s3, s4, or murmurs. Abdomen: Soft, non-tender, non-distended, BS + x 4.  Extremities: No clubbing, cyanosis or edema. DP/PT/Radials 2+ and equal bilaterally.  Accessory Clinical Findings  CBC No results for input(s): WBC, NEUTROABS, HGB, HCT, MCV, PLT in the last 72 hours. Basic Metabolic Panel  Recent Labs  10/28/14 0620 10/29/14 0425  NA 143 143  K 5.4* 4.0  CL 107 104  CO2 21 28  GLUCOSE 81 90  BUN 23 22  CREATININE 1.64* 1.50*  CALCIUM 9.7 9.5  MG 2.5 2.2   Liver  Function Tests No results for input(s): AST, ALT, ALKPHOS, BILITOT, PROT, ALBUMIN in the last 72 hours. Cardiac Enzymes No results for input(s): CKTOTAL, CKMB, CKMBINDEX, TROPONINI in the last 72 hours.  TELE AV paced rhythm    ECG  No new EKG  Echocardiogram 02/25/2014  LV EF: 60% -  65%  ------------------------------------------------------------ Indications:   CHF - 428.0.  ------------------------------------------------------------ History:  PMH: Hypothyroidism. Chronic atrial fibrillation. Hyperglycemia. Non-ST elevated myocardial infarction. Coronary artery disease. Risk factors: Hypertension.  ------------------------------------------------------------ Study Conclusions  - Left ventricle: The cavity size was normal. Wall thickness was increased in a pattern of mild LVH. There was focal basal hypertrophy. Systolic function was normal. The estimated ejection fraction was in the range of 60% to 65%. Wall motion was normal; there were no regional wall motion abnormalities. - Left atrium: The atrium was mildly dilated. - Right atrium: The atrium was mildly dilated. - Pericardium, extracardiac: A trivial pericardial effusion was identified.    Radiology/Studies  Dg Chest 2 View  10/25/2014   CLINICAL DATA:  Left-sided chest pain. Shortness of breath. Atrial fibrillation. Congestive heart failure.  EXAM: CHEST  2 VIEW  COMPARISON:  09/06/2014  FINDINGS: Mild cardiomegaly is stable. Dual lead transvenous pacemaker remains in appropriate position. Graft increased diffuse interstitial infiltrates are seen, consistent with diffuse interstitial edema. No evidence of pulmonary consolidation or pleural effusion.  IMPRESSION: Mild diffuse interstitial edema, consistent with congestive heart failure.   Electronically Signed   By: Earle Gell M.D.   On: 10/25/2014 11:12    ASSESSMENT AND PLAN  1. Acute on chronic diastolic HF - RHC 53/04/1442  PCW 18, RV 49/6/10 - lasix and BB held as patient had significant hypotension preventing further diuresis. BP improved  - Continue to be frail, PT seeing  - with improvement in BP will increase demadex to 40 mg bid.  Continue hold metoprolol.  2. A-fib on amiodarone and eliquis 3. Chest pain 4. Hypotension 5. CKD: stage IV 6. Bradycardia s/p PPM 7. Nonobstructive CAD by cath 01/2014 8. Constipation. Has received Senokot x 2. Will try Fleets enema.  9. Deconditioning. Patient lives alone but states she has good family support. Plan to DC home tomorrow with home health nurse and PT. Patient was receiving this before thru [place].  Signed,   Vladimir Lenhoff [place], [place] 10/29/2014 11:36 AM

## 2014-10-30 LAB — BASIC METABOLIC PANEL
ANION GAP: 13 (ref 5–15)
BUN: 23 mg/dL (ref 6–23)
CO2: 27 mEq/L (ref 19–32)
Calcium: 9.6 mg/dL (ref 8.4–10.5)
Chloride: 100 mEq/L (ref 96–112)
Creatinine, Ser: 1.41 mg/dL — ABNORMAL HIGH (ref 0.50–1.10)
GFR calc Af Amer: 38 mL/min — ABNORMAL LOW (ref >90)
GFR calc non Af Amer: 32 mL/min — ABNORMAL LOW (ref >90)
GLUCOSE: 79 mg/dL (ref 70–99)
POTASSIUM: 3.9 meq/L (ref 3.7–5.3)
Sodium: 140 mEq/L (ref 137–147)

## 2014-10-30 LAB — MAGNESIUM: Magnesium: 2.2 mg/dL (ref 1.5–2.5)

## 2014-10-30 MED ORDER — TORSEMIDE 20 MG PO TABS: 40.0000 mg | ORAL_TABLET | Freq: Two times a day (BID) | ORAL | Status: DC

## 2014-10-30 MED ORDER — POTASSIUM CHLORIDE CRYS ER 20 MEQ PO TBCR: 40.0000 meq | EXTENDED_RELEASE_TABLET | Freq: Two times a day (BID) | ORAL | Status: DC

## 2014-10-30 NOTE — Progress Notes (Signed)
Patient Name: [name] Date of Encounter: 10/30/2014     Active Problems:   Acute on chronic diastolic congestive heart failure    SUBJECTIVE  States breathing is better.  Denies any CP.   CURRENT MEDS . amiodarone  200 mg Oral Daily  . apixaban  2.5 mg Oral BID  . ferrous sulfate  325 mg Oral BID WC  . levothyroxine  75 mcg Oral QAC breakfast  . ondansetron  4 mg Oral Once  . pantoprazole  40 mg Oral Daily  . simvastatin  10 mg Oral QHS  . torsemide  40 mg Oral BID    OBJECTIVE  Filed Vitals:   10/29/14 1050 10/29/14 1300 10/29/14 2029 10/30/14 0600  BP: 107/54 104/32 100/32 121/60  Pulse: 54 54 74 63  Temp:  97.8 F (36.6 C) 97.9 F (36.6 C) 97.1 F (36.2 C)  TempSrc:  Oral Oral Oral  Resp: 18 18 18 16   Height:      Weight:    131 lb 11.2 oz (59.739 kg)  SpO2: 99% 98% 100% 100%    Intake/Output Summary (Last 24 hours) at 10/30/14 1043 Last data filed at 10/30/14 3846  Gross per 24 hour  Intake   1200 ml  Output   2551 ml  Net  -1351 ml   Filed Weights   10/28/14 0628 10/29/14 0550 10/30/14 0600  Weight: 136 lb 4.8 oz (61.825 kg) 134 lb 12.8 oz (61.145 kg) 131 lb 11.2 oz (59.739 kg)    PHYSICAL EXAM  General: Pleasant, NAD. Neuro: Alert and oriented X 3. Moves all extremities spontaneously. Psych: Normal affect. HEENT:  Normal  Neck: Supple without bruits or JVD. Lungs:  Resp regular and unlabored, bibasilar rales Heart: RRR no s3, s4, or murmurs. Abdomen: Soft, non-tender, non-distended, BS + x 4.  Extremities: No clubbing, cyanosis or edema. DP/PT/Radials 2+ and equal bilaterally.  Accessory Clinical Findings  CBC No results for input(s): WBC, NEUTROABS, HGB, HCT, MCV, PLT in the last 72 hours. Basic Metabolic Panel  Recent Labs  10/29/14 0425 10/30/14 0600  NA 143 140  K 4.0 3.9  CL 104 100  CO2 28 27  GLUCOSE 90 79  BUN 22 23  CREATININE 1.50* 1.41*  CALCIUM 9.5 9.6  MG 2.2 2.2   Liver Function Tests No results  for input(s): AST, ALT, ALKPHOS, BILITOT, PROT, ALBUMIN in the last 72 hours. Cardiac Enzymes No results for input(s): CKTOTAL, CKMB, CKMBINDEX, TROPONINI in the last 72 hours.  TELE AV paced rhythm    ECG  No new EKG  Echocardiogram 02/25/2014  LV EF: 60% -  65%  ------------------------------------------------------------ Indications:   CHF - 428.0.  ------------------------------------------------------------ History:  PMH: Hypothyroidism. Chronic atrial fibrillation. Hyperglycemia. Non-ST elevated myocardial infarction. Coronary artery disease. Risk factors: Hypertension.  ------------------------------------------------------------ Study Conclusions  - Left ventricle: The cavity size was normal. Wall thickness was increased in a pattern of mild LVH. There was focal basal hypertrophy. Systolic function was normal. The estimated ejection fraction was in the range of 60% to 65%. Wall motion was normal; there were no regional wall motion abnormalities. - Left atrium: The atrium was mildly dilated. - Right atrium: The atrium was mildly dilated. - Pericardium, extracardiac: A trivial pericardial effusion was identified.    Radiology/Studies  Dg Chest 2 View  10/25/2014   CLINICAL DATA:  Left-sided chest pain. Shortness of breath. Atrial fibrillation. Congestive heart failure.  EXAM: CHEST  2 VIEW  COMPARISON:  09/06/2014  FINDINGS: Mild  cardiomegaly is stable. Dual lead transvenous pacemaker remains in appropriate position. Graft increased diffuse interstitial infiltrates are seen, consistent with diffuse interstitial edema. No evidence of pulmonary consolidation or pleural effusion.  IMPRESSION: Mild diffuse interstitial edema, consistent with congestive heart failure.   Electronically Signed   By: Earle Gell M.D.   On: 10/25/2014 11:12    ASSESSMENT AND PLAN  1. Acute on chronic diastolic HF - RHC 14/05/8294 PCW 18, RV  49/6/10 - lasix and BB held as patient had significant hypotension preventing further diuresis. BP improved now and demadex resumed with good diuretic response.   - Continue to be frail, PT seeing  -   Continue hold metoprolol.  2. A-fib on amiodarone and eliquis 3. Chest pain 4. Hypotension 5. CKD: stage IV 6. Bradycardia s/p PPM 7. Nonobstructive CAD by cath 01/2014 8. Constipation.  9. Deconditioning. Patient lives alone but states she has good family support. Plan to DC home today with home health nurse and PT. Patient was receiving this before thru [place].  Patient is ready for DC today. She has a follow up appt. In the heart failure clinic on 11/06/14.  Signed,   Winry Egnew [place], [place] 10/30/2014 10:43 AM

## 2014-10-30 NOTE — Discharge Summary (Signed)
Discharge Summary   Patient ID: [name],  MRN: [id number], DOB/AGE: 24-Jul-1925 80 y.o.  Admit date: 10/25/2014 Discharge date: 10/30/2014  Primary Care Provider: [name] Primary Cardiologist: Dr. Dorris Carnes Electrophysiologist: Dr. Cristopher Peru  Discharge Diagnoses Principal Problem:   Acute on chronic diastolic congestive heart failure Active Problems:   Hyperlipidemia   HYPERTENSION, BENIGN   Bradycardia, sinus   DDD (degenerative disc disease), thoracolumbar   Hypothyroidism   Atrial fibrillation   Coronary disease non obstucted    Hypertension   Chronic anticoagulation   Chronic renal disease, stage III   Pacemaker   Allergies Allergies  Allergen Reactions  . Tramadol Nausea Only     Hospital Course  The patient is an 80 year old female with past medical history of nonobstructive CAD by cath 01/2014, history of atrial fibrillation on chronic systemic anticoagulation, chronic diastolic heart failure, hypertension, chronic kidney disease, anemia, and history of profound bradycardia requiring pacemaker placement. Patient presented to [facility] on 10/25/2014 with significant shortness of breath that woke her up from sleep. She denies any chest pain or palpitations time. The shortness of breath worsened when she lays down and improved when she stands up. Patient was admitted to cardiology service and placed on 40 mg twice a day of IV Lasix. Her atrial fibrillation on arrival appears to be rate controlled on Lopressor and amiodarone. She has been taking Eliquis as well. Serial troponin overnight were negative. She was initially diuresing well on IV Lasix, however her blood pressure went down to the 80s prompting [place] to decrease Lopressor and stop IV Lasix. She was seen on 12/14, her blood pressure remained very low. We have held Lopressor and Lasix. She was restarted on home Demadex 20 mg twice a day on 12/15. Her Demadex dose was increased to 40 mg  twice a day on the following day.  Patient was seen the morning of 10/30/2014. Her blood pressure has improved. We will continue home Demadex while holding the metoprolol. She is deemed stable for discharge from cardiology perspective. She has a follow-up with heart failure clinic on 11/06/2014. We will arrange home health nurse and PT given her deconditioning, however per patient, she has very good family support as well.   Discharge Vitals Blood pressure 121/60, pulse 63, temperature 97.1 F (36.2 C), temperature source Oral, resp. rate 16, height 4\' 10"  (1.473 m), weight 131 lb 11.2 oz (59.739 kg), SpO2 100 %.  Filed Weights   10/28/14 0628 10/29/14 0550 10/30/14 0600  Weight: 136 lb 4.8 oz (61.825 kg) 134 lb 12.8 oz (61.145 kg) 131 lb 11.2 oz (59.739 kg)    Labs  Basic Metabolic Panel  Recent Labs  10/29/14 0425 10/30/14 0600  NA 143 140  K 4.0 3.9  CL 104 100  CO2 28 27  GLUCOSE 90 79  BUN 22 23  CREATININE 1.50* 1.41*  CALCIUM 9.5 9.6  MG 2.2 2.2    Disposition  Pt is being discharged home today in good condition.  Follow-up Plans & Appointments      Follow-up Information    Follow up with [facility] On 11/06/2014.   Specialty:  Cardiology   Why:  10:20am Garage door code: [phone]   Contact information:   [address] [phone] [place] [phone]      Discharge Medications    Medication List    STOP taking these medications        metoprolol  tartrate 25 MG tablet  Commonly known as:  LOPRESSOR      TAKE these medications        acetaminophen 325 MG tablet  Commonly known as:  TYLENOL  Take 2 tablets (650 mg total) by mouth every 4 (four) hours as needed for headache or mild pain.     amiodarone 200 MG tablet  Commonly known as:  PACERONE  Take 1 tablet (200 mg total) by mouth daily.     apixaban 2.5 MG Tabs tablet  Commonly known as:  ELIQUIS  Take 1 tablet  (2.5 mg total) by mouth 2 (two) times daily.     ferrous sulfate 325 (65 FE) MG tablet  Take 1 tablet (325 mg total) by mouth 2 (two) times daily with a meal.     levothyroxine 75 MCG tablet  Commonly known as:  SYNTHROID, LEVOTHROID  Take 1 tablet (75 mcg total) by mouth daily.     loratadine 10 MG tablet  Commonly known as:  CLARITIN  Take 10 mg by mouth daily as needed for allergies.     LORazepam 0.5 MG tablet  Commonly known as:  ATIVAN  Take 0.5-1 tablets (0.25-0.5 mg total) by mouth every 8 (eight) hours as needed for anxiety.     metolazone 2.5 MG tablet  Commonly known as:  ZAROXOLYN  Take 1 tablet (2.5 mg total) by mouth daily as needed. Take only if weight in AM is greater than 144 pounds.     nitroGLYCERIN 0.4 MG SL tablet  Commonly known as:  NITROSTAT  Place 1 tablet (0.4 mg total) under the tongue every 5 (five) minutes as needed. For chest pain     pantoprazole 40 MG tablet  Commonly known as:  PROTONIX  Take 1 tablet (40 mg total) by mouth daily.     senna 8.6 MG Tabs tablet  Commonly known as:  SENOKOT  Take 2 tablets (17.2 mg total) by mouth daily as needed for mild constipation.     simvastatin 10 MG tablet  Commonly known as:  ZOCOR  Take 1 tablet (10 mg total) by mouth at bedtime.     torsemide 20 MG tablet  Commonly known as:  DEMADEX  Take 2 tablets (40 mg total) by mouth 2 (two) times daily.      ASK your doctor about these medications        potassium chloride SA 20 MEQ tablet  Commonly known as:  KLOR-CON M20  Take 1 tablet (20 mEq total) by mouth 2 (two) times daily.        Duration of Discharge Encounter   Greater than 30 minutes including physician time.  Hilbert Corrigan PA-C Pager: [phone] 10/30/2014, 11:30 AM

## 2014-10-30 NOTE — Discharge Instructions (Signed)
Avoid salty food. Limit amount of daily fluid intake to <2L. Daily morning weight. Contact cardiology if weight increase by 3 lbs overnight or 5 lbs in a single week.

## 2014-11-06 ENCOUNTER — Ambulatory Visit (HOSPITAL_COMMUNITY)
Admission: RE | Admit: 2014-11-06 | Discharge: 2014-11-06 | Disposition: A | Discharge status: Home / Self Care | Payer: Medicare Other | Source: Ambulatory Visit | Attending: Internal Medicine | Admitting: Internal Medicine

## 2014-11-06 VITALS — BP 100/70 | HR 67 | Wt 132.8 lb

## 2014-11-06 DIAGNOSIS — I2510 Atherosclerotic heart disease of native coronary artery without angina pectoris: Secondary | ICD-10-CM | POA: Insufficient documentation

## 2014-11-06 DIAGNOSIS — M1990 Unspecified osteoarthritis, unspecified site: Secondary | ICD-10-CM | POA: Diagnosis not present

## 2014-11-06 DIAGNOSIS — I5032 Chronic diastolic (congestive) heart failure: Secondary | ICD-10-CM | POA: Insufficient documentation

## 2014-11-06 DIAGNOSIS — Z7902 Long term (current) use of antithrombotics/antiplatelets: Secondary | ICD-10-CM | POA: Diagnosis not present

## 2014-11-06 DIAGNOSIS — I129 Hypertensive chronic kidney disease with stage 1 through stage 4 chronic kidney disease, or unspecified chronic kidney disease: Secondary | ICD-10-CM | POA: Diagnosis not present

## 2014-11-06 DIAGNOSIS — I482 Chronic atrial fibrillation, unspecified: Secondary | ICD-10-CM

## 2014-11-06 DIAGNOSIS — N183 Chronic kidney disease, stage 3 unspecified: Secondary | ICD-10-CM

## 2014-11-06 DIAGNOSIS — Z79899 Other long term (current) drug therapy: Secondary | ICD-10-CM | POA: Insufficient documentation

## 2014-11-06 DIAGNOSIS — E785 Hyperlipidemia, unspecified: Secondary | ICD-10-CM | POA: Insufficient documentation

## 2014-11-06 DIAGNOSIS — I4891 Unspecified atrial fibrillation: Secondary | ICD-10-CM

## 2014-11-06 DIAGNOSIS — Z950 Presence of cardiac pacemaker: Secondary | ICD-10-CM | POA: Diagnosis not present

## 2014-11-06 MED ORDER — SIMVASTATIN 10 MG PO TABS: 10.0000 mg | ORAL_TABLET | Freq: Every day | ORAL | Status: DC

## 2014-11-06 MED ORDER — METOLAZONE 2.5 MG PO TABS: 2.5000 mg | ORAL_TABLET | Freq: Every day | ORAL | Status: DC | PRN

## 2014-11-06 MED ORDER — AMIODARONE HCL 200 MG PO TABS: 200.0000 mg | ORAL_TABLET | Freq: Every day | ORAL | Status: DC

## 2014-11-06 NOTE — Progress Notes (Signed)
Patient ID: [name], female   DOB: Jan 20, 1925, 80 y.o.   MRN: [id number]  PCP: Dr Netty Starring Primary Cardiologist: Dr. Harrington Challenger  HPI: [name] is a 80 y.o. female with the history of nonobstructive CAD by cardiac catheterization in 01/2014, persistent atrial fibrillation, diastolic CHF, HTN, HL, anemia, and CKD. Patient was admitted 04/2014 x2 with acute on chronic diastolic CHF. She underwent cardioversion with restoration of NSR. This resulted in profound bradycardia and hypotension requiring pacemaker implantation. Pacemaker implant was complicated by acute blood loss anemia requiring transfusion with PRBCs  Admitted to [facility] August 21 with increased dyspnea. Diuresed with IV lasix and transitioned to po lasix 40 mg twice a day. GI evaluated due to anemia and black stool. Had EGD with no acute findings. Colonoscopy was cancelled per daughter due to multiple medical problems. She continued on eliquis 2.5 mg twice a day.  Palliative Care consulted and the patient and family request aggressive care for heart failure. Discharge weight was 152 pounds.   Admitted to [facility] 11/9 after RHC due to elevated filling pressures. Once diuresed she was put on torsemide 20 mg twice a day. She was also restarted on amiodarone 200 mg daily and continue on eliquis 2.5 mg twice a day. Discharge weight was 141 pounds.   Admitted 12/12 to 12/17 with recurrent HR and AF. Admit weight was 136. Was diuresed to 131. Diuresis limited by low BP. Demadex increased from 20 bid to 40 bid.   Post hospital  follow up:  Overall she feels better. Continues to urinate a lot. Breathing better.  Weight stable at 130-132. No orthopnea or PND. Edema improved. No dizziness.  Following a low salt diet and drinking less than 2L a day.   Studies:  - LHC (3/15): Mid LM 20-30%, mid RCA 30-40%, EF 65%  - Echo (4/15): Mild LVH, EF 60-65%, no RWMA, mild BAE, trivial eff  - Carotid US (5/15): 40-59% RICA stenosis. 0-10% LICA stenosis.    RHC 09/22/14  RA = 11 RV = 49/6/10 PA = 49/22 (34) PCW = 18 (v = 25) Fick cardiac output/index = 6.4/4.0 PVR = 2.5 WU FA sat = 95% PA sat = 72%,74%  Labs 07/18/14 K 3.5 Creatinine 1.44 Hgb 10.7  Labs 07/14/14 K 4.1 Creatinine 1.46  Hgb 9.8  Labs 07/04/14 TSH 2.7 Labs 09/06/14: K 4.0, creatinine 1.93 Labs 09/15/14 K 4.6 Creatinine 3.9  Labs 09/26/14 K 3.6 Creatinine 1.61 hgb 13.7 Labs 10/01/14 K 4.4 Creatinine 1.7 Hgb 14/1  Labs 10/30/14 K 3.9 Creatinine 1.4   ROS: All systems negative except as listed in HPI, PMH and Problem List.  Past Medical History  Diagnosis Date  . Dyslipidemia   . Hypertension   . Palpitations     PVCs/bigeminy on event in May 2009 revealing this was relatively asymptomatic  . Chest pain     a. 2011 Neg MV;  b. 01/2014 Cath: LM 20-30, LAD nl, D1 nl, LCX nl, OM1 nl, RCA dom 30-21m, PD/PL nl, EF 65%->Med Rx.  . Degenerative joint disease   . Hx of varicella   . History of skin cancer     tafeen/ non melanoma.   . Thrombocytopenia   . Anemia   . Monoclonal gammopathies   . Right lower quadrant abdominal pain 07/19/2012    Recurrent  With nausea    This is new since she had ct for llq pain in [facility]    R/o appendiceal problem  Hernia  Get ct scan  And plan  Fu     . Diastolic CHF, acute on chronic 01/31/2012  . Atrial fibrillation     a. Dx 01/2014->Eliquis started.  . Cancer   . Pacemaker   . Dysrhythmia     Current Outpatient Prescriptions  Medication Sig Dispense Refill  . acetaminophen (TYLENOL) 325 MG tablet Take 2 tablets (650 mg total) by mouth every 4 (four) hours as needed for headache or mild pain.    Marland Kitchen amiodarone (PACERONE) 200 MG tablet Take 1 tablet (200 mg total) by mouth daily. 30 tablet 6  . apixaban (ELIQUIS) 2.5 MG TABS tablet Take 1 tablet (2.5 mg total) by mouth 2 (two) times daily.    . ferrous sulfate 325 (65 FE) MG tablet Take 1 tablet (325 mg total) by mouth 2 (two) times daily with a meal. 60 tablet 1  . levothyroxine  (SYNTHROID, LEVOTHROID) 75 MCG tablet Take 1 tablet (75 mcg total) by mouth daily. 90 tablet 2  . loratadine (CLARITIN) 10 MG tablet Take 10 mg by mouth daily as needed for allergies.     Marland Kitchen LORazepam (ATIVAN) 0.5 MG tablet Take 0.5-1 tablets (0.25-0.5 mg total) by mouth every 8 (eight) hours as needed for anxiety. 30 tablet 1  . metolazone (ZAROXOLYN) 2.5 MG tablet Take 1 tablet (2.5 mg total) by mouth daily as needed. Take only if weight in AM is greater than 144 pounds. 30 tablet 6  . nitroGLYCERIN (NITROSTAT) 0.4 MG SL tablet Place 1 tablet (0.4 mg total) under the tongue every 5 (five) minutes as needed. For chest pain (Patient taking differently: Place 0.4 mg under the tongue every 5 (five) minutes as needed (MAX 3 TABLETS). For chest pain) 25 tablet 6  . pantoprazole (PROTONIX) 40 MG tablet Take 1 tablet (40 mg total) by mouth daily. 30 tablet 5  . potassium chloride SA (KLOR-CON M20) 20 MEQ tablet Take 2 tablets (40 mEq total) by mouth 2 (two) times daily. 120 tablet 3  . senna (SENOKOT) 8.6 MG TABS tablet Take 2 tablets (17.2 mg total) by mouth daily as needed for mild constipation. 30 tablet 6  . simvastatin (ZOCOR) 10 MG tablet Take 1 tablet (10 mg total) by mouth at bedtime.    . torsemide (DEMADEX) 20 MG tablet Take 2 tablets (40 mg total) by mouth 2 (two) times daily. 120 tablet 3   No current facility-administered medications for this encounter.      Filed Vitals:   11/06/14 1022  BP: 100/70  Pulse: 67  Weight: 132 lb 12.8 oz (60.238 kg)  SpO2: 95%     PHYSICAL EXAM: General:  Elderly Frail appearing. No resp difficulty. Daughter present HEENT: normal Neck: supple. JVP flat . Carotids 2+ bilaterally; no bruits. No lymphadenopathy or thryomegaly appreciated. Cor: PMI normal. Irregular rate &  rhythm. No rubs, gallops or murmurs. Lungs: clear Abdomen: soft, nontender, nondistended. No hepatosplenomegaly. No bruits or masses. Good bowel sounds. Extremities: no cyanosis,  clubbing, rash, R and LLE severe varicose veins no significant edema Neuro: alert & orientedx3, cranial nerves grossly intact. Moves all 4 extremities w/o difficulty. Affect pleasant.   ASSESSMENT & PLAN:  1. Chronic Diastolic Heart Failure - Overall improved. I suspect she may even be a bit on the dry side but appears to be tolerating higher dose of demadex - Will check labs. If renal function stable with continue current dose. If creatinine going up can cut back to 40/20. Use metolazone for weight of 135 or greater.  -  Place TED hose - Reinforced the need and importance of daily weights, a low sodium diet, and fluid restriction (less than 2 L a day). Instructed to call the HF clinic if weight increases more than 3 lbs overnight or 5 lbs in a week.  2. Chronic A fib with symptomatic bradycardia s/p PPM - previously thought to have failed amiodarone and had chronic AF. However in November in the hospital she was in NSR so amio restarted. Now she is back in AF. Will confirm with ECG. If truly AF. Will stop amio - Continue Eliquis 2.5 mg twice a day.  3. HTN - Watch BP closely. SBP running in 90-100s  Continue current meds.  4. CKD stage III  - stable    Follow up in 4 weeks.  Maricel Swartzendruber NP-C  11:10 AM

## 2014-11-06 NOTE — Addendum Note (Signed)
Encounter addended by: Scarlette Calico, RN on: 11/06/2014 11:30 AM<BR>     Documentation filed: Visit Diagnoses, Dx Association, Orders

## 2014-11-06 NOTE — Addendum Note (Signed)
Encounter addended by: Scarlette Calico, RN on: 11/06/2014 11:42 AM<BR>     Documentation filed: Patient Instructions Section, Orders

## 2014-11-06 NOTE — Patient Instructions (Signed)
Take Metolazone 2.5 mg for weight 135 lb or greater  Labs 12/28 with [facility] physician recommends that you schedule a follow-up appointment in: 3 weeks

## 2014-11-17 ENCOUNTER — Encounter: Payer: Self-pay | Admitting: Internal Medicine

## 2014-11-17 ENCOUNTER — Telehealth (HOSPITAL_COMMUNITY): Payer: Self-pay | Admitting: Vascular Surgery

## 2014-11-17 NOTE — Telephone Encounter (Signed)
Nurse w/ [facility] called need Telem[place] orders .Marland Kitchen Please advise

## 2014-11-18 NOTE — Telephone Encounter (Signed)
Spoke w/Tim, RN with [facility] and gave verbal order for telemonitoring

## 2014-11-21 ENCOUNTER — Encounter: Payer: Self-pay | Admitting: Internal Medicine

## 2014-11-21 ENCOUNTER — Ambulatory Visit (INDEPENDENT_AMBULATORY_CARE_PROVIDER_SITE_OTHER): Payer: Medicare Other | Admitting: Internal Medicine

## 2014-11-21 VITALS — BP 98/60 | Temp 98.2°F | Ht <= 58 in | Wt 132.2 lb

## 2014-11-21 DIAGNOSIS — D509 Iron deficiency anemia, unspecified: Secondary | ICD-10-CM

## 2014-11-21 DIAGNOSIS — I5032 Chronic diastolic (congestive) heart failure: Secondary | ICD-10-CM

## 2014-11-21 DIAGNOSIS — N183 Chronic kidney disease, stage 3 unspecified: Secondary | ICD-10-CM

## 2014-11-21 DIAGNOSIS — IMO0001 Reserved for inherently not codable concepts without codable children: Secondary | ICD-10-CM

## 2014-11-21 DIAGNOSIS — E038 Other specified hypothyroidism: Secondary | ICD-10-CM

## 2014-11-21 DIAGNOSIS — R54 Age-related physical debility: Secondary | ICD-10-CM

## 2014-11-21 DIAGNOSIS — M5135 Other intervertebral disc degeneration, thoracolumbar region: Secondary | ICD-10-CM

## 2014-11-21 DIAGNOSIS — Z7901 Long term (current) use of anticoagulants: Secondary | ICD-10-CM

## 2014-11-21 MED ORDER — FERROUS SULFATE 325 (65 FE) MG PO TABS: 325.0000 mg | ORAL_TABLET | Freq: Two times a day (BID) | ORAL | Status: DC

## 2014-11-21 MED ORDER — APIXABAN 2.5 MG PO TABS: 2.5000 mg | ORAL_TABLET | Freq: Two times a day (BID) | ORAL | Status: DC

## 2014-11-21 MED ORDER — LEVOTHYROXINE SODIUM 75 MCG PO TABS: 75.0000 ug | ORAL_TABLET | Freq: Every day | ORAL | Status: DC

## 2014-11-21 NOTE — Patient Instructions (Addendum)
Will refill the thyroid med for 6 months  Don't take iron near thyroid medication it can interfere with absorption.  eliquis  For one month or so   Diuretics  Could refill through cardiology left.  ROV in  4- 6 months or as needed .  Advise get the prevnar 13  " Baby pneumonia shot)Can call ahead to get this

## 2014-11-21 NOTE — Progress Notes (Signed)
Pre visit review using our clinic review tool, if applicable. No additional management support is needed unless otherwise documented below in the visit note.   Chief Complaint  Patient presents with  . Follow-up    hosp    HPI: [name] 81 y.o. comes in for" fu rehab " hosp for recurernt cardiac.  Failure . Here with daughter   Out of hopsital December .   Now in chf clinic and doing better less anxiety Has a home delivery  For chf.  Less  Appetite No falling   Cane in case Having pt  I have no records  To reviewed  despite request t  Has chonric daistolic and chf  Seeing dr Sung Amabile   No excess bruising bleeding  On eliquis for  afib  And amiodarone  Need s refill thryoid eliquis and diuretic if needed  Andiron  ROS: See pertinent positives and negatives per HPI. No current active cp si   No gi sx today  No change hearing and vison   No falls   Past Medical History  Diagnosis Date  . Dyslipidemia   . Hypertension   . Palpitations     PVCs/bigeminy on event in May 2009 revealing this was relatively asymptomatic  . Chest pain     a. 2011 Neg MV;  b. 01/2014 Cath: LM 20-30, LAD nl, D1 nl, LCX nl, OM1 nl, RCA dom 30-36m, PD/PL nl, EF 65%->Med Rx.  . Degenerative joint disease   . Hx of varicella   . History of skin cancer     tafeen/ non melanoma.   . Thrombocytopenia   . Anemia   . Monoclonal gammopathies   . Right lower quadrant abdominal pain 07/19/2012    Recurrent  With nausea    This is new since she had ct for llq pain in [facility]    R/o appendiceal problem  Hernia  Get ct scan  And plan  Fu     . Diastolic CHF, acute on chronic 01/31/2012  . Atrial fibrillation     a. Dx 01/2014->Eliquis started.  . Cancer   . Pacemaker   . Dysrhythmia     Family History  Problem Relation Age of Onset  . Coronary artery disease Father   . Heart disease Father   . Lung cancer    . Alzheimer's disease Mother   . Cancer Brother 7    lung cancer  . Heart attack Father      History   Social History  . Marital Status: Widowed    Spouse Name: N/A    Number of Children: 3  . Years of Education: N/A   Occupational History  .      [employer], book keeping   Social History Main Topics  . Smoking status: Never Smoker   . Smokeless tobacco: Never Used  . Alcohol Use: No  . Drug Use: No  . Sexual Activity: Not Currently   Other Topics Concern  . 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 Eats with her.    Outpatient Encounter Prescriptions as of 11/21/2014  Medication Sig  . acetaminophen (TYLENOL) 325 MG tablet Take  2 tablets (650 mg total) by mouth every 4 (four) hours as needed for headache or mild pain.  Marland Kitchen amiodarone (PACERONE) 200 MG tablet Take 1 tablet (200 mg total) by mouth daily.  Marland Kitchen apixaban (ELIQUIS) 2.5 MG TABS tablet Take 1 tablet (2.5 mg total) by mouth 2 (two) times daily.  . ferrous sulfate 325 (65 FE) MG tablet Take 1 tablet (325 mg total) by mouth 2 (two) times daily with a meal.  . levothyroxine (SYNTHROID, LEVOTHROID) 75 MCG tablet Take 1 tablet (75 mcg total) by mouth daily.  Marland Kitchen loratadine (CLARITIN) 10 MG tablet Take 10 mg by mouth daily as needed for allergies.   Marland Kitchen LORazepam (ATIVAN) 0.5 MG tablet Take 0.5-1 tablets (0.25-0.5 mg total) by mouth every 8 (eight) hours as needed for anxiety.  . metolazone (ZAROXOLYN) 2.5 MG tablet Take 1 tablet (2.5 mg total) by mouth daily as needed (take for wt 135 lb or greater). Take only if weight in AM is greater than 144 pounds.  . metoprolol tartrate (LOPRESSOR) 25 MG tablet Take 25 mg by mouth 2 (two) times daily.   . nitroGLYCERIN (NITROSTAT) 0.4 MG SL tablet Place 1 tablet (0.4 mg total) under the tongue every 5 (five) minutes as needed. For chest pain (Patient  taking differently: Place 0.4 mg under the tongue every 5 (five) minutes as needed (MAX 3 TABLETS). For chest pain)  . pantoprazole (PROTONIX) 40 MG tablet Take 1 tablet (40 mg total) by mouth daily.  . potassium chloride SA (KLOR-CON M20) 20 MEQ tablet Take 2 tablets (40 mEq total) by mouth 2 (two) times daily.  Marland Kitchen senna (SENOKOT) 8.6 MG TABS tablet Take 2 tablets (17.2 mg total) by mouth daily as needed for mild constipation.  . simvastatin (ZOCOR) 10 MG tablet Take 1 tablet (10 mg total) by mouth at bedtime.  . torsemide (DEMADEX) 20 MG tablet Take 2 tablets (40 mg total) by mouth 2 (two) times daily.  . [DISCONTINUED] apixaban (ELIQUIS) 2.5 MG TABS tablet Take 1 tablet (2.5 mg total) by mouth 2 (two) times daily.  . [DISCONTINUED] ferrous sulfate 325 (65 FE) MG tablet Take 1 tablet (325 mg total) by mouth 2 (two) times daily with a meal.  . [DISCONTINUED] levothyroxine (SYNTHROID, LEVOTHROID) 75 MCG tablet Take 1 tablet (75 mcg total) by mouth daily.    EXAM:  BP 98/60 mmHg  Temp(Src) 98.2 F (36.8 C) (Oral)  Ht 4\' 10"  (1.473 m)  Wt 132 lb 3.2 oz (59.966 kg)  BMI 27.64 kg/m2  Body mass index is 27.64 kg/(m^2).  GENERAL: vitals reviewed and listed above, alert, oriented, appears well hydrated and in no acute distress looks well today  HEENT: atraumatic, conjunctiva  clear, no obvious abnormalities on inspection of external nose and ears  NECK: no obvious masses on inspection palpation  LUNGS: clear to auscultation bilaterally, no wheezes, rales or rhonchi, kyphosis  CV: HRRR, no clubbing cyanosis or   Slight peripheral edema nl cap refill  Gait bent over  Independent  Sitting to walking  MS: moves all extremities without noticeable focal  abnormality PSYCH: pleasant and cooperative, no obvious depression or anxiety Lab Results  Component Value Date   WBC 4.1 10/26/2014   HGB 11.7* 10/26/2014   HCT 37.6 10/26/2014   PLT 126* 10/26/2014   GLUCOSE 79 10/30/2014   CHOL 141  04/14/2014   TRIG 56 04/14/2014   HDL 89 04/14/2014   LDLCALC 41 04/14/2014   ALT 14 10/26/2014   AST 19 10/26/2014  NA 140 10/30/2014   K 3.9 10/30/2014   CL 100 10/30/2014   CREATININE 1.41* 10/30/2014   BUN 23 10/30/2014   CO2 27 10/30/2014   TSH 2.760 07/04/2014   INR 1.98* 09/22/2014    ASSESSMENT AND PLAN:  Discussed the following assessment and plan:  Chronic diastolic heart failure  Iron deficiency anemia  Chronic anticoagulation  Other specified hypothyroidism  DDD (degenerative disc disease), thoracolumbar  Chronic renal disease, stage III  Age factor currently seem much more stable that in past hospitalizations and her anxiety is much less .  hsa rr today  Reviewed meds and how to take them  Cv meds per cards  Will refill eliquis for 1  Month then per cards .  Will refill iron and thyroid    And plan fu in 4-6 months depending 'adivse prevnar 13 hesitant can disc with dr B -Patient advised to return or notify health care team  if symptoms worsen ,persist or new concerns arise. Total visit 69mins > 50% spent counseling and coordinating care      Patient Instructions  Will refill the thyroid med for 6 months  Don't take iron near thyroid medication it can interfere with absorption.  eliquis  For one month or so   Diuretics  Could refill through cardiology left.  ROV in  4- 6 months or as needed .  Advise get the prevnar 13  " Baby pneumonia shot)Can call ahead to get this      Standley Brooking. Panosh M.D.  Hospital Course  The patient is an 81 year old female with past medical history of nonobstructive CAD by cath 01/2014, history of atrial fibrillation on chronic systemic anticoagulation, chronic diastolic heart failure, hypertension, chronic kidney disease, anemia, and history of profound bradycardia requiring pacemaker placement. Patient presented to [facility] on 10/25/2014 with significant shortness of breath that woke her up from sleep. She  denies any chest pain or palpitations time. The shortness of breath worsened when she lays down and improved when she stands up. Patient was admitted to cardiology service and placed on 40 mg twice a day of IV Lasix. Her atrial fibrillation on arrival appears to be rate controlled on Lopressor and amiodarone. She has been taking Eliquis as well. Serial troponin overnight were negative. She was initially diuresing well on IV Lasix, however her blood pressure went down to the 80s prompting [place] to decrease Lopressor and stop IV Lasix. She was seen on 12/14, her blood pressure remained very low. We have held Lopressor and Lasix. She was restarted on home Demadex 20 mg twice a day on 12/15. Her Demadex dose was increased to 40 mg twice a day on the following day.  Patient was seen the morning of 10/30/2014. Her blood pressure has improved. We will continue home Demadex while holding the metoprolol. She is deemed stable for discharge from cardiology perspective. She has a follow-up with heart failure clinic on 11/06/2014. We will arrange home health nurse and PT given her deconditioning, however per patient, she has very good family support as well.   Discharge Vitals Blood pressure 121/60, pulse 63, temperature 97.1 F (36.2 C), temperature source Oral, resp. rate 16, height 4\' 10"  (1.473 m), weight 131 lb 11.2 oz (59.739 kg), SpO2 100 %.  Filed Weights   10/28/14 0628 10/29/14 0550 10/30/14 0600  Weight: 136 lb 4.8 oz (61.825 kg) 134 lb 12.8 oz (61.145 kg) 131 lb 11.2 oz (59.739 kg)

## 2014-11-27 ENCOUNTER — Ambulatory Visit (HOSPITAL_COMMUNITY)
Admission: RE | Admit: 2014-11-27 | Discharge: 2014-11-27 | Disposition: A | Discharge status: Home / Self Care | Payer: Medicare Other | Source: Ambulatory Visit | Attending: Internal Medicine | Admitting: Internal Medicine

## 2014-11-27 VITALS — BP 100/54 | HR 61 | Wt 136.0 lb

## 2014-11-27 DIAGNOSIS — I10 Essential (primary) hypertension: Secondary | ICD-10-CM | POA: Insufficient documentation

## 2014-11-27 DIAGNOSIS — I480 Paroxysmal atrial fibrillation: Secondary | ICD-10-CM | POA: Diagnosis not present

## 2014-11-27 DIAGNOSIS — N183 Chronic kidney disease, stage 3 unspecified: Secondary | ICD-10-CM

## 2014-11-27 DIAGNOSIS — I5032 Chronic diastolic (congestive) heart failure: Secondary | ICD-10-CM | POA: Insufficient documentation

## 2014-11-27 NOTE — Patient Instructions (Signed)
Follow up in 3 months   Do the following things EVERYDAY: 1) Weigh yourself in the morning before breakfast. Write it down and keep it in a log. 2) Take your medicines as prescribed 3) Eat low salt foods-Limit salt (sodium) to 2000 mg per day.  4) Stay as active as you can everyday 5) Limit all fluids for the day to less than 2 liters  

## 2014-11-27 NOTE — Progress Notes (Signed)
Patient ID: [name], female   DOB: 21-Jul-1925, 81 y.o.   MRN: [id number]  PCP: Dr Netty Starring Primary Cardiologist: Dr. Harrington Challenger  HPI: [name] is a 81 y.o. female with the history of nonobstructive CAD by cardiac catheterization in 01/2014, persistent atrial fibrillation, diastolic CHF, HTN, HL, anemia, and CKD. Patient was admitted 04/2014 x2 with acute on chronic diastolic CHF. She underwent cardioversion with restoration of NSR. This resulted in profound bradycardia and hypotension requiring pacemaker implantation. Pacemaker implant was complicated by acute blood loss anemia requiring transfusion with PRBCs  Admitted to [facility] August 21 with increased dyspnea. Diuresed with IV lasix and transitioned to po lasix 40 mg twice a day. GI evaluated due to anemia and black stool. Had EGD with no acute findings. Colonoscopy was cancelled per daughter due to multiple medical problems. She continued on eliquis 2.5 mg twice a day.  Palliative Care consulted and the patient and family request aggressive care for heart failure. Discharge weight was 152 pounds.   Admitted to [facility] 11/9 after [facility] due to elevated filling pressures. Once diuresed she was put on torsemide 20 mg twice a day. She was also restarted on amiodarone 200 mg daily and continue on eliquis 2.5 mg twice a day. Discharge weight was 141 pounds.   Admitted 12/12 to 12/17 with recurrent HR and AF. Admit weight was 136. Was diuresed to 131. Diuresis limited by low BP. Demadex increased from 20 bid to 40 bid.   She return for follow up: Overall she feels better. Breathing better.  Weight stable at 130-134 pounds.  No orthopnea or PND. Edema improved. No dizziness.  Following a low salt diet and drinking less than 2L a day. No bleeding problems.   Studies:  - LHC (3/15): Mid LM 20-30%, mid RCA 30-40%, EF 65%  - Echo (4/15): Mild LVH, EF 60-65%, no RWMA, mild BAE, trivial eff  - Carotid US (5/15): 40-59% RICA stenosis. 1-19% LICA stenosis.    RHC 09/22/14  RA = 11 RV = 49/6/10 PA = 49/22 (34) PCW = 18 (v = 25) Fick cardiac output/index = 6.4/4.0 PVR = 2.5 WU FA sat = 95% PA sat = 72%,74%  Labs 07/18/14 K 3.5 Creatinine 1.44 Hgb 10.7  Labs 07/14/14 K 4.1 Creatinine 1.46  Hgb 9.8  Labs 07/04/14 TSH 2.7 Labs 09/06/14: K 4.0, creatinine 1.93 Labs 09/15/14 K 4.6 Creatinine 3.9  Labs 09/26/14 K 3.6 Creatinine 1.61 hgb 13.7 Labs 10/01/14 K 4.4 Creatinine 1.7 Hgb 14/1  Labs 10/30/14 K 3.9 Creatinine 1.4   ROS: All systems negative except as listed in HPI, PMH and Problem List.  Past Medical History  Diagnosis Date  . Dyslipidemia   . Hypertension   . Palpitations     PVCs/bigeminy on event in May 2009 revealing this was relatively asymptomatic  . Chest pain     a. 2011 Neg MV;  b. 01/2014 Cath: LM 20-30, LAD nl, D1 nl, LCX nl, OM1 nl, RCA dom 30-58m, PD/PL nl, EF 65%->Med Rx.  . Degenerative joint disease   . Hx of varicella   . History of skin cancer     tafeen/ non melanoma.   . Thrombocytopenia   . Anemia   . Monoclonal gammopathies   . Right lower quadrant abdominal pain 07/19/2012    Recurrent  With nausea    This is new since she had ct for llq pain in [facility]    R/o appendiceal problem  Hernia  Get ct scan  And  plan  Fu     . Diastolic CHF, acute on chronic 01/31/2012  . Atrial fibrillation     a. Dx 01/2014->Eliquis started.  . Cancer   . Pacemaker   . Dysrhythmia     Current Outpatient Prescriptions  Medication Sig Dispense Refill  . acetaminophen (TYLENOL) 325 MG tablet Take 2 tablets (650 mg total) by mouth every 4 (four) hours as needed for headache or mild pain.    Marland Kitchen amiodarone (PACERONE) 200 MG tablet Take 1 tablet (200 mg total) by mouth daily. 30 tablet 6  . apixaban (ELIQUIS) 2.5 MG TABS tablet Take 1 tablet (2.5 mg total) by mouth 2 (two) times daily. 60 tablet 0  . ferrous sulfate 325 (65 FE) MG tablet Take 1 tablet (325 mg total) by mouth 2 (two) times daily with a meal. 180 tablet 1  . levothyroxine  (SYNTHROID, LEVOTHROID) 75 MCG tablet Take 1 tablet (75 mcg total) by mouth daily. 90 tablet 1  . loratadine (CLARITIN) 10 MG tablet Take 10 mg by mouth daily as needed for allergies.     Marland Kitchen LORazepam (ATIVAN) 0.5 MG tablet Take 0.5-1 tablets (0.25-0.5 mg total) by mouth every 8 (eight) hours as needed for anxiety. 30 tablet 1  . metolazone (ZAROXOLYN) 2.5 MG tablet Take 1 tablet (2.5 mg total) by mouth daily as needed (take for wt 135 lb or greater). Take only if weight in AM is greater than 144 pounds. 30 tablet 6  . metoprolol tartrate (LOPRESSOR) 25 MG tablet Take 25 mg by mouth 2 (two) times daily.   3  . pantoprazole (PROTONIX) 40 MG tablet Take 1 tablet (40 mg total) by mouth daily. 30 tablet 5  . potassium chloride SA (KLOR-CON M20) 20 MEQ tablet Take 2 tablets (40 mEq total) by mouth 2 (two) times daily. 120 tablet 3  . senna (SENOKOT) 8.6 MG TABS tablet Take 2 tablets (17.2 mg total) by mouth daily as needed for mild constipation. 30 tablet 6  . simvastatin (ZOCOR) 10 MG tablet Take 1 tablet (10 mg total) by mouth at bedtime. 30 tablet 6  . torsemide (DEMADEX) 20 MG tablet Take 2 tablets (40 mg total) by mouth 2 (two) times daily. 120 tablet 3  . nitroGLYCERIN (NITROSTAT) 0.4 MG SL tablet Place 1 tablet (0.4 mg total) under the tongue every 5 (five) minutes as needed. For chest pain (Patient not taking: Reported on 11/27/2014) 25 tablet 6   No current facility-administered medications for this encounter.      Filed Vitals:   11/27/14 1208  BP: 100/54  Pulse: 61  Weight: 136 lb (61.689 kg)  SpO2: 97%     PHYSICAL EXAM: General:  Elderly Frail appearing. No resp difficulty. Daughter present HEENT: normal except R cheek bandaide noted.    Neck: supple. JVP flat . Carotids 2+ bilaterally; no bruits. No lymphadenopathy or thryomegaly appreciated. Cor: PMI normal. Regular  rate &  rhythm. No rubs, gallops or murmurs. Lungs: clear Abdomen: soft, nontender, nondistended. No  hepatosplenomegaly. No bruits or masses. Good bowel sounds. Extremities: no cyanosis, clubbing, rash, R and LLE severe varicose veins no significant edema Neuro: alert & orientedx3, cranial nerves grossly intact. Moves all 4 extremities w/o difficulty. Affect pleasant.   ASSESSMENT & PLAN:  1. Chronic Diastolic Heart Failure  -Continue torsemide 40 mg twice a day.  Continue TED hose - Reinforced the need and importance of daily weights, a low sodium diet, and fluid restriction (less than 2 L a day). Instructed  to call the HF clinic if weight increases more than 3 lbs overnight or 5 lbs in a week.  2.PAF -  symptomatic bradycardia s/p PPM.  previously thought to have failed amiodarone and had chronic AF. However in November in the hospital she was in NSR so amio restarted. - Continue Eliquis 2.5 mg twice a day. No bleeding problems. Noted.  3. HTN- Ok today.  Continue current meds.  4. CKD stage III - stable  Follow up in 3 months    CLEGG,AMY NP-C  12:12 PM

## 2014-12-01 ENCOUNTER — Ambulatory Visit (INDEPENDENT_AMBULATORY_CARE_PROVIDER_SITE_OTHER): Payer: Medicare Other | Admitting: Family Medicine

## 2014-12-01 DIAGNOSIS — Z23 Encounter for immunization: Secondary | ICD-10-CM

## 2014-12-06 ENCOUNTER — Emergency Department (HOSPITAL_COMMUNITY): Payer: Medicare Other

## 2014-12-06 ENCOUNTER — Observation Stay (HOSPITAL_COMMUNITY): Payer: Medicare Other

## 2014-12-06 ENCOUNTER — Observation Stay (HOSPITAL_COMMUNITY)
Admission: EM | Admit: 2014-12-06 | Discharge: 2014-12-09 | Disposition: A | Discharge status: Home / Self Care | Payer: Medicare Other | Attending: Cardiology | Admitting: Cardiology

## 2014-12-06 ENCOUNTER — Encounter (HOSPITAL_COMMUNITY): Payer: Self-pay

## 2014-12-06 DIAGNOSIS — I129 Hypertensive chronic kidney disease with stage 1 through stage 4 chronic kidney disease, or unspecified chronic kidney disease: Secondary | ICD-10-CM | POA: Insufficient documentation

## 2014-12-06 DIAGNOSIS — E785 Hyperlipidemia, unspecified: Secondary | ICD-10-CM | POA: Diagnosis not present

## 2014-12-06 DIAGNOSIS — N1330 Unspecified hydronephrosis: Secondary | ICD-10-CM | POA: Insufficient documentation

## 2014-12-06 DIAGNOSIS — N289 Disorder of kidney and ureter, unspecified: Secondary | ICD-10-CM

## 2014-12-06 DIAGNOSIS — I4891 Unspecified atrial fibrillation: Secondary | ICD-10-CM | POA: Diagnosis present

## 2014-12-06 DIAGNOSIS — N281 Cyst of kidney, acquired: Secondary | ICD-10-CM | POA: Diagnosis not present

## 2014-12-06 DIAGNOSIS — R0602 Shortness of breath: Secondary | ICD-10-CM | POA: Diagnosis present

## 2014-12-06 DIAGNOSIS — D472 Monoclonal gammopathy: Secondary | ICD-10-CM | POA: Insufficient documentation

## 2014-12-06 DIAGNOSIS — N179 Acute kidney failure, unspecified: Secondary | ICD-10-CM | POA: Insufficient documentation

## 2014-12-06 DIAGNOSIS — I5033 Acute on chronic diastolic (congestive) heart failure: Principal | ICD-10-CM | POA: Insufficient documentation

## 2014-12-06 DIAGNOSIS — N189 Chronic kidney disease, unspecified: Secondary | ICD-10-CM

## 2014-12-06 DIAGNOSIS — N183 Chronic kidney disease, stage 3 unspecified: Secondary | ICD-10-CM | POA: Diagnosis present

## 2014-12-06 DIAGNOSIS — I493 Ventricular premature depolarization: Secondary | ICD-10-CM | POA: Insufficient documentation

## 2014-12-06 DIAGNOSIS — R7989 Other specified abnormal findings of blood chemistry: Secondary | ICD-10-CM

## 2014-12-06 DIAGNOSIS — E8770 Fluid overload, unspecified: Secondary | ICD-10-CM | POA: Diagnosis not present

## 2014-12-06 DIAGNOSIS — Z950 Presence of cardiac pacemaker: Secondary | ICD-10-CM | POA: Insufficient documentation

## 2014-12-06 DIAGNOSIS — I10 Essential (primary) hypertension: Secondary | ICD-10-CM | POA: Diagnosis present

## 2014-12-06 DIAGNOSIS — Z7901 Long term (current) use of anticoagulants: Secondary | ICD-10-CM

## 2014-12-06 DIAGNOSIS — M1990 Unspecified osteoarthritis, unspecified site: Secondary | ICD-10-CM | POA: Diagnosis not present

## 2014-12-06 DIAGNOSIS — E876 Hypokalemia: Secondary | ICD-10-CM | POA: Diagnosis not present

## 2014-12-06 DIAGNOSIS — I509 Heart failure, unspecified: Secondary | ICD-10-CM | POA: Insufficient documentation

## 2014-12-06 DIAGNOSIS — R778 Other specified abnormalities of plasma proteins: Secondary | ICD-10-CM

## 2014-12-06 DIAGNOSIS — R079 Chest pain, unspecified: Secondary | ICD-10-CM

## 2014-12-06 DIAGNOSIS — I5032 Chronic diastolic (congestive) heart failure: Secondary | ICD-10-CM | POA: Diagnosis present

## 2014-12-06 LAB — CBC WITH DIFFERENTIAL/PLATELET
Basophils Absolute: 0.1 10*3/uL (ref 0.0–0.1)
Basophils Relative: 1 % (ref 0–1)
EOS PCT: 2 % (ref 0–5)
Eosinophils Absolute: 0.1 10*3/uL (ref 0.0–0.7)
HCT: 39.7 % (ref 36.0–46.0)
Hemoglobin: 12.8 g/dL (ref 12.0–15.0)
LYMPHS ABS: 1.4 10*3/uL (ref 0.7–4.0)
LYMPHS PCT: 23 % (ref 12–46)
MCH: 31 pg (ref 26.0–34.0)
MCHC: 32.2 g/dL (ref 30.0–36.0)
MCV: 96.1 fL (ref 78.0–100.0)
Monocytes Absolute: 0.4 10*3/uL (ref 0.1–1.0)
Monocytes Relative: 7 % (ref 3–12)
NEUTROS ABS: 3.9 10*3/uL (ref 1.7–7.7)
NEUTROS PCT: 67 % (ref 43–77)
Platelets: 130 10*3/uL — ABNORMAL LOW (ref 150–400)
RBC: 4.13 MIL/uL (ref 3.87–5.11)
RDW: 15.6 % — ABNORMAL HIGH (ref 11.5–15.5)
WBC: 5.9 10*3/uL (ref 4.0–10.5)

## 2014-12-06 LAB — BASIC METABOLIC PANEL
Anion gap: 11 (ref 5–15)
BUN: 38 mg/dL — ABNORMAL HIGH (ref 6–23)
CO2: 25 mmol/L (ref 19–32)
Calcium: 9.6 mg/dL (ref 8.4–10.5)
Chloride: 105 mmol/L (ref 96–112)
Creatinine, Ser: 2.35 mg/dL — ABNORMAL HIGH (ref 0.50–1.10)
GFR calc Af Amer: 20 mL/min — ABNORMAL LOW (ref >90)
GFR, EST NON AFRICAN AMERICAN: 18 mL/min — AB (ref >90)
Glucose, Bld: 112 mg/dL — ABNORMAL HIGH (ref 70–99)
Potassium: 4.8 mmol/L (ref 3.5–5.1)
Sodium: 141 mmol/L (ref 135–145)

## 2014-12-06 LAB — URINALYSIS, ROUTINE W REFLEX MICROSCOPIC
BILIRUBIN URINE: NEGATIVE
GLUCOSE, UA: NEGATIVE mg/dL
Hgb urine dipstick: NEGATIVE
Ketones, ur: NEGATIVE mg/dL
NITRITE: NEGATIVE
PROTEIN: NEGATIVE mg/dL
Specific Gravity, Urine: 1.009 (ref 1.005–1.030)
UROBILINOGEN UA: 0.2 mg/dL (ref 0.0–1.0)
pH: 5.5 (ref 5.0–8.0)

## 2014-12-06 LAB — TROPONIN I: Troponin I: 0.08 ng/mL — ABNORMAL HIGH (ref <0.031)

## 2014-12-06 LAB — URINE MICROSCOPIC-ADD ON

## 2014-12-06 LAB — HEPATIC FUNCTION PANEL
ALBUMIN: 3.4 g/dL — AB (ref 3.5–5.2)
ALK PHOS: 94 U/L (ref 39–117)
ALT: 19 U/L (ref 0–35)
AST: 28 U/L (ref 0–37)
Bilirubin, Direct: 0.2 mg/dL (ref 0.0–0.5)
Indirect Bilirubin: 0.8 mg/dL (ref 0.3–0.9)
TOTAL PROTEIN: 7 g/dL (ref 6.0–8.3)
Total Bilirubin: 1 mg/dL (ref 0.3–1.2)

## 2014-12-06 LAB — I-STAT TROPONIN, ED: TROPONIN I, POC: 0.09 ng/mL — AB (ref 0.00–0.08)

## 2014-12-06 LAB — BRAIN NATRIURETIC PEPTIDE: B Natriuretic Peptide: 947.6 pg/mL — ABNORMAL HIGH (ref 0.0–100.0)

## 2014-12-06 MED ORDER — LORAZEPAM 0.5 MG PO TABS
0.2500 mg | ORAL_TABLET | Freq: Three times a day (TID) | ORAL | Status: DC | PRN
  Administered 2014-12-09: 0.25 mg via ORAL
  Filled 2014-12-06: qty 1

## 2014-12-06 MED ORDER — METOPROLOL TARTRATE 25 MG PO TABS
25.0000 mg | ORAL_TABLET | Freq: Two times a day (BID) | ORAL | Status: DC
  Administered 2014-12-06 – 2014-12-09 (×6): 25 mg via ORAL
  Filled 2014-12-06 (×8): qty 1

## 2014-12-06 MED ORDER — SENNA 8.6 MG PO TABS: 2.0000 | ORAL_TABLET | Freq: Every day | ORAL | Status: DC | PRN

## 2014-12-06 MED ORDER — APIXABAN 2.5 MG PO TABS
2.5000 mg | ORAL_TABLET | Freq: Two times a day (BID) | ORAL | Status: DC
  Administered 2014-12-06 – 2014-12-09 (×7): 2.5 mg via ORAL
  Filled 2014-12-06 (×8): qty 1

## 2014-12-06 MED ORDER — LORATADINE 10 MG PO TABS
10.0000 mg | ORAL_TABLET | Freq: Every day | ORAL | Status: DC | PRN
  Administered 2014-12-07 – 2014-12-08 (×2): 10 mg via ORAL
  Filled 2014-12-06 (×3): qty 1

## 2014-12-06 MED ORDER — ACETAMINOPHEN 325 MG PO TABS
650.0000 mg | ORAL_TABLET | ORAL | Status: DC | PRN
  Administered 2014-12-06 – 2014-12-09 (×6): 650 mg via ORAL
  Filled 2014-12-06 (×6): qty 2

## 2014-12-06 MED ORDER — PANTOPRAZOLE SODIUM 40 MG PO TBEC
40.0000 mg | DELAYED_RELEASE_TABLET | Freq: Every day | ORAL | Status: DC
  Administered 2014-12-06 – 2014-12-09 (×4): 40 mg via ORAL
  Filled 2014-12-06 (×3): qty 1

## 2014-12-06 MED ORDER — TORSEMIDE 20 MG PO TABS
40.0000 mg | ORAL_TABLET | Freq: Two times a day (BID) | ORAL | Status: DC
Start: 2014-12-06 — End: 2014-12-07
  Administered 2014-12-06: 40 mg via ORAL
  Filled 2014-12-06 (×4): qty 2

## 2014-12-06 MED ORDER — FUROSEMIDE 10 MG/ML IJ SOLN
80.0000 mg | Freq: Once | INTRAMUSCULAR | Status: AC
Start: 2014-12-06 — End: 2014-12-06
  Administered 2014-12-06: 80 mg via INTRAVENOUS
  Filled 2014-12-06: qty 8

## 2014-12-06 MED ORDER — SIMVASTATIN 10 MG PO TABS
10.0000 mg | ORAL_TABLET | Freq: Every day | ORAL | Status: DC
  Administered 2014-12-06 – 2014-12-08 (×3): 10 mg via ORAL
  Filled 2014-12-06 (×4): qty 1

## 2014-12-06 MED ORDER — AMIODARONE HCL 200 MG PO TABS
200.0000 mg | ORAL_TABLET | Freq: Every day | ORAL | Status: DC
  Administered 2014-12-06 – 2014-12-09 (×4): 200 mg via ORAL
  Filled 2014-12-06 (×4): qty 1

## 2014-12-06 MED ORDER — LORATADINE 10 MG PO TABS
10.0000 mg | ORAL_TABLET | Freq: Once | ORAL | Status: AC
  Administered 2014-12-06: 10 mg via ORAL
  Filled 2014-12-06: qty 1

## 2014-12-06 MED ORDER — SODIUM CHLORIDE 0.9 % IJ SOLN
3.0000 mL | Freq: Two times a day (BID) | INTRAMUSCULAR | Status: DC
  Administered 2014-12-06 – 2014-12-09 (×6): 3 mL via INTRAVENOUS

## 2014-12-06 MED ORDER — LEVOTHYROXINE SODIUM 75 MCG PO TABS
75.0000 ug | ORAL_TABLET | Freq: Every day | ORAL | Status: DC
  Administered 2014-12-06 – 2014-12-09 (×4): 75 ug via ORAL
  Filled 2014-12-06 (×5): qty 1

## 2014-12-06 MED ORDER — SODIUM CHLORIDE 0.9 % IV SOLN: 250.0000 mL | INTRAVENOUS | Status: DC | PRN

## 2014-12-06 MED ORDER — ONDANSETRON HCL 4 MG/2ML IJ SOLN: 4.0000 mg | Freq: Four times a day (QID) | INTRAMUSCULAR | Status: DC | PRN

## 2014-12-06 MED ORDER — METOLAZONE 5 MG PO TABS
5.0000 mg | ORAL_TABLET | Freq: Once | ORAL | Status: AC
  Administered 2014-12-06: 5 mg via ORAL
  Filled 2014-12-06: qty 1

## 2014-12-06 MED ORDER — NITROGLYCERIN 0.4 MG SL SUBL: 0.4000 mg | SUBLINGUAL_TABLET | SUBLINGUAL | Status: DC | PRN

## 2014-12-06 MED ORDER — SODIUM CHLORIDE 0.9 % IJ SOLN: 3.0000 mL | INTRAMUSCULAR | Status: DC | PRN

## 2014-12-06 NOTE — ED Provider Notes (Signed)
CSN: [id number]     Arrival date & time 12/06/14  0134 History   First MD Initiated Contact with Patient 12/06/14 0148     Chief Complaint  Patient presents with  . Shortness of Breath     (Consider location/radiation/quality/duration/timing/severity/associated sxs/prior Treatment) Patient is a 81 y.o. female presenting with shortness of breath. The history is provided by the patient.  Shortness of Breath She noted onset at about 11 PM of difficulty breathing. She was watching television at the time. Dyspnea waxes and wanes but it is definitely worse when she is supine and better when she is sitting or standing. She describes a vague sensation in her chest which moves across her chest but she cannot describe it. There is no associated nausea or diaphoresis. She is a patient in clinic and monitors her weight daily and weight this morning was up by 1 pound. She is anticoagulated on apixaban.  Past Medical History  Diagnosis Date  . Dyslipidemia   . Hypertension   . Palpitations     PVCs/bigeminy on event in May 2009 revealing this was relatively asymptomatic  . Chest pain     a. 2011 Neg MV;  b. 01/2014 Cath: LM 20-30, LAD nl, D1 nl, LCX nl, OM1 nl, RCA dom 30-38m, PD/PL nl, EF 65%->Med Rx.  . Degenerative joint disease   . Hx of varicella   . History of skin cancer     tafeen/ non melanoma.   . Thrombocytopenia   . Anemia   . Monoclonal gammopathies   . Right lower quadrant abdominal pain 07/19/2012    Recurrent  With nausea    This is new since she had ct for llq pain in [facility]    R/o appendiceal problem  Hernia  Get ct scan  And plan  Fu     . Diastolic CHF, acute on chronic 01/31/2012  . Atrial fibrillation     a. Dx 01/2014->Eliquis started.  . Cancer   . Pacemaker   . Dysrhythmia    Past Surgical History  Procedure Laterality Date  . Cholecystectomy  1999  . Cesarean section      times 2  . Shoulder surgery  1996    Right   . Cataract extraction      Bilateral  implantt   . Cardioversion N/A 02/26/2014    Procedure: CARDIOVERSION AT BEDSIDE;  Surgeon: Pixie Casino, MD;  Location: [place];  Service: Cardiovascular;  Laterality: N/A;  . Cardioversion N/A 04/22/2014    Procedure: CARDIOVERSION (BEDSIDE);  Surgeon: [name], MD;  Location: [facility] OR;  Service: Cardiovascular;  Laterality: N/A;  . Pacemaker insertion  04/23/2014    STJ Assurity dual chamber pacemaker implanted by Dr Rayann Heman  . Insert / replace / remove pacemaker    . Esophagogastroduodenoscopy N/A 07/06/2014    Procedure: ESOPHAGOGASTRODUODENOSCOPY (EGD);  Surgeon: Ladene Artist, MD;  Location: [facility] ENDOSCOPY;  Service: Endoscopy;  Laterality: N/A;  . Left heart catheterization with coronary angiogram N/A 01/29/2014    Procedure: LEFT HEART CATHETERIZATION WITH CORONARY ANGIOGRAM;  Surgeon: Blane Ohara, MD;  Location: [facility] CATH LAB;  Service: Cardiovascular;  Laterality: N/A;  . Permanent pacemaker insertion N/A 04/23/2014    Procedure: PERMANENT PACEMAKER INSERTION;  Surgeon: Evans Lance, MD;  Location: [facility] CATH LAB;  Service: Cardiovascular;  Laterality: N/A;  . Right heart catheterization N/A 09/22/2014    Procedure: RIGHT HEART CATH;  Surgeon: Jolaine Artist, MD;  Location: [facility] CATH LAB;  Service: Cardiovascular;  Laterality: N/A;   Family History  Problem Relation Age of Onset  . Coronary artery disease Father   . Heart disease Father   . Lung cancer    . Alzheimer's disease Mother   . Cancer Brother 50    lung cancer  . Heart attack Father    History  Substance Use Topics  . Smoking status: Never Smoker   . Smokeless tobacco: Never Used  . Alcohol Use: No   OB History    No data available     Review of Systems  Respiratory: Positive for shortness of breath.   All other systems reviewed and are negative.     Allergies  Tramadol  Home Medications   Prior to Admission medications   Medication Sig Start Date End Date Taking? Authorizing Provider  acetaminophen  (TYLENOL) 325 MG tablet Take 2 tablets (650 mg total) by mouth every 4 (four) hours as needed for headache or mild pain. 09/26/14   Isaiah Serge, NP  amiodarone (PACERONE) 200 MG tablet Take 1 tablet (200 mg total) by mouth daily. 11/06/14   Jolaine Artist, MD  apixaban (ELIQUIS) 2.5 MG TABS tablet Take 1 tablet (2.5 mg total) by mouth 2 (two) times daily. 11/21/14   Burnis Medin, MD  ferrous sulfate 325 (65 FE) MG tablet Take 1 tablet (325 mg total) by mouth 2 (two) times daily with a meal. 11/21/14   Burnis Medin, MD  levothyroxine (SYNTHROID, LEVOTHROID) 75 MCG tablet Take 1 tablet (75 mcg total) by mouth daily. 11/21/14   Burnis Medin, MD  loratadine (CLARITIN) 10 MG tablet Take 10 mg by mouth daily as needed for allergies.     Historical Provider, MD  LORazepam (ATIVAN) 0.5 MG tablet Take 0.5-1 tablets (0.25-0.5 mg total) by mouth every 8 (eight) hours as needed for anxiety. 09/09/14   Burnis Medin, MD  metolazone (ZAROXOLYN) 2.5 MG tablet Take 1 tablet (2.5 mg total) by mouth daily as needed (take for wt 135 lb or greater). Take only if weight in AM is greater than 144 pounds. 11/06/14   Jolaine Artist, MD  metoprolol tartrate (LOPRESSOR) 25 MG tablet Take 25 mg by mouth 2 (two) times daily.  11/20/14   Historical Provider, MD  nitroGLYCERIN (NITROSTAT) 0.4 MG SL tablet Place 1 tablet (0.4 mg total) under the tongue every 5 (five) minutes as needed. For chest pain Patient not taking: Reported on 11/27/2014 12/02/13   Fay Records, MD  pantoprazole (PROTONIX) 40 MG tablet Take 1 tablet (40 mg total) by mouth daily. 08/11/14   Burnis Medin, MD  potassium chloride SA (KLOR-CON M20) 20 MEQ tablet Take 2 tablets (40 mEq total) by mouth 2 (two) times daily. 10/30/14   Almyra Deforest, PA  senna (SENOKOT) 8.6 MG TABS tablet Take 2 tablets (17.2 mg total) by mouth daily as needed for mild constipation. 09/26/14   Isaiah Serge, NP  simvastatin (ZOCOR) 10 MG tablet Take 1 tablet (10 mg total) by mouth  at bedtime. 11/06/14   Jolaine Artist, MD  torsemide (DEMADEX) 20 MG tablet Take 2 tablets (40 mg total) by mouth 2 (two) times daily. 10/30/14   Almyra Deforest, PA   BP 91/63 mmHg  Pulse 60  Temp(Src) 98.1 F (36.7 C) (Oral)  Resp 17  SpO2 98% Physical Exam  Nursing note and vitals reviewed.  81 year old female, resting comfortably and in no acute distress. Vital signs are normal. Oxygen saturation is  98%, which is normal. Head is normocephalic and atraumatic. PERRLA, EOMI. Oropharynx is clear. Neck is nontender and supple without adenopathy or JVD. Back is nontender and there is no CVA tenderness. Lungs have bibasilar rales without wheezes or rhonchi. Chest is nontender. Heart has regular rate and rhythm without murmur. Abdomen is soft, flat, nontender without masses or hepatosplenomegaly and peristalsis is normoactive. Extremities have no cyanosis or edema, full range of motion is present. Skin is warm and dry without rash. Neurologic: Mental status is normal, cranial nerves are intact, there are no motor or sensory deficits.  ED Course  Procedures (including critical care time) Labs Review Results for orders placed or performed during the hospital encounter of 12/06/14  BNP (order ONLY if patient complains of dyspnea/SOB AND you have documented it for THIS visit)  Result Value Ref Range   B Natriuretic Peptide 947.6 (H) 0.0 - 100.0 pg/mL  Basic metabolic panel  Result Value Ref Range   Sodium 141 135 - 145 mmol/L   Potassium 4.8 3.5 - 5.1 mmol/L   Chloride 105 96 - 112 mmol/L   CO2 25 19 - 32 mmol/L   Glucose, Bld 112 (H) 70 - 99 mg/dL   BUN 38 (H) 6 - 23 mg/dL   Creatinine, Ser 2.35 (H) 0.50 - 1.10 mg/dL   Calcium 9.6 8.4 - 10.5 mg/dL   GFR calc non Af Amer 18 (L) >90 mL/min   GFR calc Af Amer 20 (L) >90 mL/min   Anion gap 11 5 - 15  CBC with Differential/Platelet  Result Value Ref Range   WBC 5.9 4.0 - 10.5 K/uL   RBC 4.13 3.87 - 5.11 MIL/uL   Hemoglobin 12.8 12.0  - 15.0 g/dL   HCT 39.7 36.0 - 46.0 %   MCV 96.1 78.0 - 100.0 fL   MCH 31.0 26.0 - 34.0 pg   MCHC 32.2 30.0 - 36.0 g/dL   RDW 15.6 (H) 11.5 - 15.5 %   Platelets 130 (L) 150 - 400 K/uL   Neutrophils Relative % 67 43 - 77 %   Lymphocytes Relative 23 12 - 46 %   Monocytes Relative 7 3 - 12 %   Eosinophils Relative 2 0 - 5 %   Basophils Relative 1 0 - 1 %   Neutro Abs 3.9 1.7 - 7.7 K/uL   Lymphs Abs 1.4 0.7 - 4.0 K/uL   Monocytes Absolute 0.4 0.1 - 1.0 K/uL   Eosinophils Absolute 0.1 0.0 - 0.7 K/uL   Basophils Absolute 0.1 0.0 - 0.1 K/uL   Smear Review MORPHOLOGY UNREMARKABLE   Troponin I  Result Value Ref Range   Troponin I 0.08 (H) <0.031 ng/mL  I-stat troponin, ED (not at [facility])  Result Value Ref Range   Troponin i, poc 0.09 (HH) 0.00 - 0.08 ng/mL   Comment NOTIFIED PHYSICIAN    Comment 3           Imaging Review Dg Chest 2 View  12/06/2014   CLINICAL DATA:  Chest pain  EXAM: CHEST  2 VIEW  COMPARISON:  10/25/2014  FINDINGS: There is chronic cardiomegaly. Aortic hilar contours are stable when accounting for vascular congestion in the hila. Chronic leftward deviation of the trachea related to thyroid enlargement, reference chest CT June 2015. There is diffuse interstitial coarsening with Kerley lines, above patient's baseline. There are small bilateral pleural effusions.  Dual-chamber pacer leads from the right are in stable position.  IMPRESSION: CHF.   Electronically Signed   By:  Jorje Guild M.D.   On: 12/06/2014 02:42     Date: 12/06/2014  Rate: 60  Rhythm: AV dual paced rhythm with occasional PVC  QRS Axis: right  Intervals: normal  ST/T Wave abnormalities: normal  Conduction Disutrbances:none  Narrative Interpretation: AV dual paced rhythm with occasional PVC. When compared with ECG of 11/06/2014, no significant changes are seen.  Old EKG Reviewed: unchanged    MDM   Final diagnoses:  Chest pain  CHF exacerbation  Elevated troponin I level  Acute on chronic renal  insufficiency    Dyspnea with very slight recent weight gain. Pattern is suggestive of CHF exacerbation. BNP, troponin, and chest x-ray have been ordered. Old records have been reviewed confirming she is an established patient in the heart failure clinic.  BNP has come back moderately elevated at 979. Troponin I point-of-care was elevated at 0.09. This was repeated in the main lab and was still elevated at 0.08. Patient is already anticoagulated on apixaban, so heparin is not started. Blood pressure has been borderline low to low which would make it difficult to give any diuretics. Case is discussed with Dr. Ulyses Amor of cardiology service who agrees to come to evaluate the patient for possible admission.   Delora Fuel, MD 44/31/54 0086

## 2014-12-06 NOTE — ED Notes (Signed)
Dr Roxanne Mins notified of troponin results 0.09

## 2014-12-06 NOTE — ED Notes (Addendum)
Per EMS - Pt c/o shortness of breath for 1.5hrs. Lung sounds clear to auscultation, 95-96% on RA. Placed on 2L O2 for comfort. Pt c/o discomfort across chest. Pt c/o some dizziness. Hands cold; Pt SBP normally around 100; SBP en route went back and forth between 78-98. 22G in Left Forearm. Pt has hx of CHF; pacemaker. BP 98/42, 75bpm, 96% on 3L, paced rhythm.

## 2014-12-06 NOTE — Progress Notes (Signed)
SUBJECTIVE:  Patient admitted a few hours ago.  Breathing fine.    PHYSICAL EXAM Filed Vitals:   12/06/14 0300 12/06/14 0315 12/06/14 0400 12/06/14 0508  BP: 88/42 93/49 97/52  94/56  Pulse: 60 59 59 60  Temp:    97.4 F (36.3 C)  TempSrc:    Oral  Resp: 17 17 18 18   Height:    4\' 10"  (1.473 m)  Weight:    134 lb 1.6 oz (60.827 kg)  SpO2: 96% 97% 100% 96%   General:  No distress Lungs:  Clear Heart:  RRR Abdomen:  Positive bowel sounds, no rebound no guarding Extremities:  No edema   LABS: Lab Results  Component Value Date   TROPONINI 0.08* 12/06/2014   Results for orders placed or performed during the hospital encounter of 12/06/14 (from the past 24 hour(s))  BNP (order ONLY if patient complains of dyspnea/SOB AND you have documented it for THIS visit)     Status: Abnormal   Collection Time: 12/06/14  1:57 AM  Result Value Ref Range   B Natriuretic Peptide 947.6 (H) 0.0 - 100.0 pg/mL  Basic metabolic panel     Status: Abnormal   Collection Time: 12/06/14  1:58 AM  Result Value Ref Range   Sodium 141 135 - 145 mmol/L   Potassium 4.8 3.5 - 5.1 mmol/L   Chloride 105 96 - 112 mmol/L   CO2 25 19 - 32 mmol/L   Glucose, Bld 112 (H) 70 - 99 mg/dL   BUN 38 (H) 6 - 23 mg/dL   Creatinine, Ser 2.35 (H) 0.50 - 1.10 mg/dL   Calcium 9.6 8.4 - 10.5 mg/dL   GFR calc non Af Amer 18 (L) >90 mL/min   GFR calc Af Amer 20 (L) >90 mL/min   Anion gap 11 5 - 15  I-stat troponin, ED (not at [facility])     Status: Abnormal   Collection Time: 12/06/14  2:06 AM  Result Value Ref Range   Troponin i, poc 0.09 (HH) 0.00 - 0.08 ng/mL   Comment NOTIFIED PHYSICIAN    Comment 3          CBC with Differential/Platelet     Status: Abnormal   Collection Time: 12/06/14  2:23 AM  Result Value Ref Range   WBC 5.9 4.0 - 10.5 K/uL   RBC 4.13 3.87 - 5.11 MIL/uL   Hemoglobin 12.8 12.0 - 15.0 g/dL   HCT 39.7 36.0 - 46.0 %   MCV 96.1 78.0 - 100.0 fL   MCH 31.0 26.0 - 34.0 pg   MCHC 32.2 30.0 - 36.0 g/dL    RDW 15.6 (H) 11.5 - 15.5 %   Platelets 130 (L) 150 - 400 K/uL   Neutrophils Relative % 67 43 - 77 %   Lymphocytes Relative 23 12 - 46 %   Monocytes Relative 7 3 - 12 %   Eosinophils Relative 2 0 - 5 %   Basophils Relative 1 0 - 1 %   Neutro Abs 3.9 1.7 - 7.7 K/uL   Lymphs Abs 1.4 0.7 - 4.0 K/uL   Monocytes Absolute 0.4 0.1 - 1.0 K/uL   Eosinophils Absolute 0.1 0.0 - 0.7 K/uL   Basophils Absolute 0.1 0.0 - 0.1 K/uL   Smear Review MORPHOLOGY UNREMARKABLE   Troponin I     Status: Abnormal   Collection Time: 12/06/14  2:23 AM  Result Value Ref Range   Troponin I 0.08 (H) <0.031 ng/mL  Urinalysis, Routine w  reflex microscopic     Status: Abnormal   Collection Time: 12/06/14  4:40 AM  Result Value Ref Range   Color, Urine YELLOW YELLOW   APPearance HAZY (A) CLEAR   Specific Gravity, Urine 1.009 1.005 - 1.030   pH 5.5 5.0 - 8.0   Glucose, UA NEGATIVE NEGATIVE mg/dL   Hgb urine dipstick NEGATIVE NEGATIVE   Bilirubin Urine NEGATIVE NEGATIVE   Ketones, ur NEGATIVE NEGATIVE mg/dL   Protein, ur NEGATIVE NEGATIVE mg/dL   Urobilinogen, UA 0.2 0.0 - 1.0 mg/dL   Nitrite NEGATIVE NEGATIVE   Leukocytes, UA SMALL (A) NEGATIVE  Urine microscopic-add on     Status: None   Collection Time: 12/06/14  4:40 AM  Result Value Ref Range   Squamous Epithelial / LPF RARE RARE   WBC, UA 3-6 <3 WBC/hpf   RBC / HPF 0-2 <3 RBC/hpf   Bacteria, UA RARE RARE  Hepatic function panel     Status: Abnormal   Collection Time: 12/06/14  5:00 AM  Result Value Ref Range   Total Protein 7.0 6.0 - 8.3 g/dL   Albumin 3.4 (L) 3.5 - 5.2 g/dL   AST 28 0 - 37 U/L   ALT 19 0 - 35 U/L   Alkaline Phosphatase 94 39 - 117 U/L   Total Bilirubin 1.0 0.3 - 1.2 mg/dL   Bilirubin, Direct 0.2 0.0 - 0.5 mg/dL   Indirect Bilirubin 0.8 0.3 - 0.9 mg/dL    Intake/Output Summary (Last 24 hours) at 12/06/14 1610 Last data filed at 12/06/14 0604  Gross per 24 hour  Intake    120 ml  Output    250 ml  Net   -130 ml     ASSESSMENT AND PLAN:  ACUTE ON CHRONIC DIASTOLIC HF:   She got one dose of IV diuretic and PO zaroxolyn.  I agree with this.  We will need to see what the creat is tomorrow.    CKD:  Creat clearance is currently 16.5.  This is not a level at which Eliquis was used in clinical trials.  If creat continues at this level we will need to consider change to warfarin.   I will order a renal ultrasound.   ELEVATED TROPONIN:  Probably demand ischemia.    Jeneen Rinks [facility] 12/06/2014 9:02 AM

## 2014-12-06 NOTE — Progress Notes (Signed)
UR completed 

## 2014-12-06 NOTE — Discharge Instructions (Signed)

## 2014-12-06 NOTE — Progress Notes (Signed)
Pt daughter at the bedside questions answered, daughter concerned about insurance not covering demadex asked daughter to bring paper work.

## 2014-12-06 NOTE — Progress Notes (Signed)
Admitted pt to rm 3E01 from ED, pt alert and oriented, denies pain at this time. Admission orders carried out. Pt's systolic BP runs soft and pt is scheduled to get 80mg  of lasix IV. Dr. Ulyses Amor notified and said that it's ok to give the 80mg  lasix.   12/06/14 0508  Vitals  Temp 97.4 F (36.3 C)  Temp Source Oral  BP (!) 94/56 mmHg  BP Location Right Arm  BP Method Automatic  Patient Position (if appropriate) Lying  Pulse Rate 60  Pulse Rate Source Dinamap  Resp 18  Oxygen Therapy  SpO2 96 %  O2 Device Room Air  Height and Weight  Height 4\' 10"  (1.473 m)  Weight 60.827 kg (134 lb 1.6 oz) (Scale A)  Type of Scale Used Standing  Type of Weight Actual  BSA (Calculated - sq m) 1.58 sq meters  BMI (Calculated) 28.1  Weight in (lb) to have BMI = 25 119.4

## 2014-12-06 NOTE — H&P (Signed)
PCP:   [name], MD  Cardiologist: Glori Bickers  Chief Complaint: Shortness of breath   HPI:  This 81 year old Caucasian female with past medical history of chronic diastolic congestive heart failure, atrial fibrillation currently AV paced, frequent admissions for CHF exacerbation, who presented with one day of worsening shortness of breath, orthopnea. Reported that presentation is consistent with her typical heart failure exacerbation/fluid overload. Patient regretted that she didn't take an extra dose of diuretic at home prior to her symptoms getting worse. Patient appeared relatively comfortable at the time of my evaluation on 2 L of oxygen nasal cannula, positioned at 45 in bed.  Patient denied lower extremity edema, frequent or prolonged palpitations, lower extremity edema, nausea vomiting, chills, fevers, productive cough, sick contacts, dysuria.  Review of Systems:  12 systems were reviewed and were negative except mentioned in the history of present illness  Past Medical History: Past Medical History  Diagnosis Date  . Dyslipidemia   . Hypertension   . Palpitations     PVCs/bigeminy on event in May 2009 revealing this was relatively asymptomatic  . Chest pain     a. 2011 Neg MV;  b. 01/2014 Cath: LM 20-30, LAD nl, D1 nl, LCX nl, OM1 nl, RCA dom 30-53m, PD/PL nl, EF 65%->Med Rx.  . Degenerative joint disease   . Hx of varicella   . History of skin cancer     tafeen/ non melanoma.   . Thrombocytopenia   . Anemia   . Monoclonal gammopathies   . Right lower quadrant abdominal pain 07/19/2012    Recurrent  With nausea    This is new since she had ct for llq pain in [facility]    R/o appendiceal problem  Hernia  Get ct scan  And plan  Fu     . Diastolic CHF, acute on chronic 01/31/2012  . Atrial fibrillation     a. Dx 01/2014->Eliquis started.  . Cancer   . Pacemaker   . Dysrhythmia    Past Surgical History  Procedure Laterality Date  . Cholecystectomy  1999  .  Cesarean section      times 2  . Shoulder surgery  1996    Right   . Cataract extraction      Bilateral  implantt  . Cardioversion N/A 02/26/2014    Procedure: CARDIOVERSION AT BEDSIDE;  Surgeon: Pixie Casino, MD;  Location: [place];  Service: Cardiovascular;  Laterality: N/A;  . Cardioversion N/A 04/22/2014    Procedure: CARDIOVERSION (BEDSIDE);  Surgeon: [name], MD;  Location: [facility] OR;  Service: Cardiovascular;  Laterality: N/A;  . Pacemaker insertion  04/23/2014    STJ Assurity dual chamber pacemaker implanted by Dr Rayann Heman  . Insert / replace / remove pacemaker    . Esophagogastroduodenoscopy N/A 07/06/2014    Procedure: ESOPHAGOGASTRODUODENOSCOPY (EGD);  Surgeon: Ladene Artist, MD;  Location: [facility] ENDOSCOPY;  Service: Endoscopy;  Laterality: N/A;  . Left heart catheterization with coronary angiogram N/A 01/29/2014    Procedure: LEFT HEART CATHETERIZATION WITH CORONARY ANGIOGRAM;  Surgeon: Blane Ohara, MD;  Location: [facility] CATH LAB;  Service: Cardiovascular;  Laterality: N/A;  . Permanent pacemaker insertion N/A 04/23/2014    Procedure: PERMANENT PACEMAKER INSERTION;  Surgeon: Evans Lance, MD;  Location: [facility] CATH LAB;  Service: Cardiovascular;  Laterality: N/A;  . Right heart catheterization N/A 09/22/2014    Procedure: RIGHT HEART CATH;  Surgeon: Jolaine Artist, MD;  Location: [facility] CATH LAB;  Service: Cardiovascular;  Laterality: N/A;  Medications: Prior to Admission medications   Medication Sig Start Date End Date Taking? Authorizing Provider  acetaminophen (TYLENOL) 325 MG tablet Take 2 tablets (650 mg total) by mouth every 4 (four) hours as needed for headache or mild pain. 09/26/14  Yes Isaiah Serge, NP  amiodarone (PACERONE) 200 MG tablet Take 1 tablet (200 mg total) by mouth daily. 11/06/14  Yes Jolaine Artist, MD  apixaban (ELIQUIS) 2.5 MG TABS tablet Take 1 tablet (2.5 mg total) by mouth 2 (two) times daily. 11/21/14  Yes Burnis Medin, MD  ferrous sulfate 325  (65 FE) MG tablet Take 1 tablet (325 mg total) by mouth 2 (two) times daily with a meal. 11/21/14  Yes Burnis Medin, MD  levothyroxine (SYNTHROID, LEVOTHROID) 75 MCG tablet Take 1 tablet (75 mcg total) by mouth daily. 11/21/14  Yes Burnis Medin, MD  loratadine (CLARITIN) 10 MG tablet Take 10 mg by mouth daily as needed for allergies.    Yes Historical Provider, MD  LORazepam (ATIVAN) 0.5 MG tablet Take 0.5-1 tablets (0.25-0.5 mg total) by mouth every 8 (eight) hours as needed for anxiety. 09/09/14  Yes Burnis Medin, MD  metolazone (ZAROXOLYN) 2.5 MG tablet Take 1 tablet (2.5 mg total) by mouth daily as needed (take for wt 135 lb or greater). Take only if weight in AM is greater than 144 pounds. 11/06/14  Yes Jolaine Artist, MD  metoprolol tartrate (LOPRESSOR) 25 MG tablet Take 25 mg by mouth 2 (two) times daily.  11/20/14  Yes Historical Provider, MD  nitroGLYCERIN (NITROSTAT) 0.4 MG SL tablet Place 1 tablet (0.4 mg total) under the tongue every 5 (five) minutes as needed. For chest pain 12/02/13  Yes Fay Records, MD  pantoprazole (PROTONIX) 40 MG tablet Take 1 tablet (40 mg total) by mouth daily. 08/11/14  Yes Burnis Medin, MD  potassium chloride SA (KLOR-CON M20) 20 MEQ tablet Take 2 tablets (40 mEq total) by mouth 2 (two) times daily. 10/30/14  Yes Almyra Deforest, PA  senna (SENOKOT) 8.6 MG TABS tablet Take 2 tablets (17.2 mg total) by mouth daily as needed for mild constipation. 09/26/14  Yes Isaiah Serge, NP  simvastatin (ZOCOR) 10 MG tablet Take 1 tablet (10 mg total) by mouth at bedtime. 11/06/14  Yes Jolaine Artist, MD  torsemide (DEMADEX) 20 MG tablet Take 2 tablets (40 mg total) by mouth 2 (two) times daily. 10/30/14  Yes Almyra Deforest, PA    Allergies:   Allergies  Allergen Reactions  . Tramadol Nausea Only    Social History:  reports that she has never smoked. She has never used smokeless tobacco. She reports that she does not drink alcohol or use illicit drugs.  Family  History: Family History  Problem Relation Age of Onset  . Coronary artery disease Father   . Heart disease Father   . Lung cancer    . Alzheimer's disease Mother   . Cancer Brother 1    lung cancer  . Heart attack Father     PHYSICAL EXAM:  Filed Vitals:   12/06/14 0215 12/06/14 0240 12/06/14 0300 12/06/14 0315  BP: 103/56 83/55 88/42  93/49  Pulse: 60 63 60 59  Temp:      TempSrc:      Resp: 18 18 17 17   SpO2: 100% 97% 96% 97%   General:  Well appearing. No respiratory difficulty HEENT: normal Neck: supple. JVD 14 cm. Carotids 2+ bilat; no bruits. No lymphadenopathy or thryomegaly  appreciated. Cor: PMI nondisplaced. Regular rate & rhythm. No rubs, gallops or murmurs. Lungs: bibasilar crackles Abdomen: soft, nontender, nondistended. No hepatosplenomegaly. No bruits or masses. Good bowel sounds. Extremities: no cyanosis, clubbing, rash, edema, well perfused Neuro: alert & oriented x 3, cranial nerves grossly intact. moves all 4 extremities w/o difficulty. Affect pleasant.  Bedside ultrasound:  CV: Dilated IVC with decreased respiratory variation, E/E'=23; grossly normal RV no V function, no pericardial effusion, marked left atrial dilatation Lungs: Bilateral B lines suggestive of pulmonary edema, no pleural effusions Abdomen: No obvious hydronephrosis  Labs on Admission:   Recent Labs  12/06/14 0158  NA 141  K 4.8  CL 105  CO2 25  GLUCOSE 112*  BUN 38*  CREATININE 2.35*  CALCIUM 9.6    Recent Labs  12/06/14 0223  WBC 5.9  NEUTROABS 3.9  HGB 12.8  HCT 39.7  MCV 96.1  PLT 130*    Recent Labs  12/06/14 0223  TROPONINI 0.08*   Other blood work trend prior to current presentation  Labs 07/18/14 K 3.5 Creatinine 1.44 Hgb 10.7  Labs 07/14/14 K 4.1 Creatinine 1.46 Hgb 9.8  Labs 07/04/14 TSH 2.7 Labs 09/06/14: K 4.0, creatinine 1.93 Labs 09/15/14 K 4.6 Creatinine 3.9  Labs 09/26/14 K 3.6 Creatinine 1.61 hgb 13.7 Labs 10/01/14 K 4.4 Creatinine 1.7 Hgb  14/1  Labs 10/30/14 K 3.9 Creatinine 1.4   Radiological Exams on Admission (all images were personally reviewed and interpreted by me, radiology reports were reviewed as well): Dg Chest 2 View  12/06/2014   CLINICAL DATA:  Chest pain  EXAM: CHEST  2 VIEW  COMPARISON:  10/25/2014  FINDINGS: There is chronic cardiomegaly. Aortic hilar contours are stable when accounting for vascular congestion in the hila. Chronic leftward deviation of the trachea related to thyroid enlargement, reference chest CT June 2015. There is diffuse interstitial coarsening with Kerley lines, above patient's baseline. There are small bilateral pleural effusions.  Dual-chamber pacer leads from the right are in stable position.  IMPRESSION: CHF.   Electronically Signed   By: Jorje Guild M.D.   On: 12/06/2014 02:42   Studies:  - LHC (3/15): Mid LM 20-30%, mid RCA 30-40%, EF 65%  - Echo (4/15): Mild LVH, EF 60-65%, no RWMA, mild BAE, trivial eff  - Carotid US (5/15): 40-59% RICA stenosis. 9-51% LICA stenosis.   [place] 09/22/14  RA = 11 RV = 49/6/10 PA = 49/22 (34) PCW = 18 (v = 25) Fick cardiac output/index = 6.4/4.0 PVR = 2.5 WU FA sat = 95% PA sat = 72%,74%  EKG personally reviewed and interpreted by me: A-RV sequentially paced at 60 bpm  Assessment:  Acute on chronic diastolic congestive heart failure Acute on chronic renal failure Borderline hypotension History of atrial fibrillation chads-vasc score = 5 (age>75; female: non-obstructive CAD, hx of HTN)  Patient is well-known to cardiology and heart failure service. Present with shortness of breath, orthopnea, ultrasound and chest x-ray findings of pulmonary edema and fluid overload. Also noted to have acute on chronic renal failure (etiology of this is not entirely clear, possibly volume overload and elevated right-sided filling pressures?? No apparent hydronephrosis on the bedside ultrasound).  Plan: - Admitted for observation to telemetry - Continue  torsemide 40 mg twice a day (home dose), we'll give Lasix 80 mg IV 1 and metolazone 5 mg by mouth 1 as a start. She doesn't appear to be massively fluid overloaded, other than significantly elevated filling pressures, according to her scales weight increased  only by 1 pounds, no peripheral edema - No Ace or ARB due to renal failure and hypotension, hold potassium supplementation - Monitor electrolytes and renal function, obtain urinalysis with microscopy for acute on chronic renal failure - Check LFTs - Continue apixaban, amiodarone, metoprolol for history of A.fib   [name] 12/06/2014, 3:33 AM

## 2014-12-07 DIAGNOSIS — N289 Disorder of kidney and ureter, unspecified: Secondary | ICD-10-CM

## 2014-12-07 DIAGNOSIS — R7989 Other specified abnormal findings of blood chemistry: Secondary | ICD-10-CM

## 2014-12-07 DIAGNOSIS — R778 Other specified abnormalities of plasma proteins: Secondary | ICD-10-CM

## 2014-12-07 DIAGNOSIS — N189 Chronic kidney disease, unspecified: Secondary | ICD-10-CM | POA: Diagnosis not present

## 2014-12-07 DIAGNOSIS — N179 Acute kidney failure, unspecified: Secondary | ICD-10-CM

## 2014-12-07 LAB — BASIC METABOLIC PANEL
ANION GAP: 9 (ref 5–15)
BUN: 41 mg/dL — ABNORMAL HIGH (ref 6–23)
CALCIUM: 9.2 mg/dL (ref 8.4–10.5)
CO2: 33 mmol/L — ABNORMAL HIGH (ref 19–32)
Chloride: 95 mmol/L — ABNORMAL LOW (ref 96–112)
Creatinine, Ser: 2.57 mg/dL — ABNORMAL HIGH (ref 0.50–1.10)
GFR calc Af Amer: 18 mL/min — ABNORMAL LOW (ref >90)
GFR calc non Af Amer: 16 mL/min — ABNORMAL LOW (ref >90)
Glucose, Bld: 97 mg/dL (ref 70–99)
Potassium: 3.2 mmol/L — ABNORMAL LOW (ref 3.5–5.1)
Sodium: 137 mmol/L (ref 135–145)

## 2014-12-07 MED ORDER — POTASSIUM CHLORIDE CRYS ER 20 MEQ PO TBCR
40.0000 meq | EXTENDED_RELEASE_TABLET | Freq: Once | ORAL | Status: AC
  Administered 2014-12-07: 40 meq via ORAL

## 2014-12-07 MED ORDER — POTASSIUM CHLORIDE CRYS ER 20 MEQ PO TBCR
40.0000 meq | EXTENDED_RELEASE_TABLET | Freq: Once | ORAL | Status: AC
  Administered 2014-12-07: 40 meq via ORAL
  Filled 2014-12-07: qty 2

## 2014-12-07 NOTE — Progress Notes (Signed)
    SUBJECTIVE:  Breathing OK.  No distress   PHYSICAL EXAM Filed Vitals:   12/06/14 2207 12/06/14 2240 12/07/14 0139 12/07/14 0513  BP: 111/55  93/41 92/49  Pulse: 59 60 60 60  Temp: 98 F (36.7 C)  97.1 F (36.2 C) 97.5 F (36.4 C)  TempSrc: Oral  Oral Oral  Resp: 20  20 20   Height:      Weight:    130 lb 11.7 oz (59.3 kg)  SpO2: 95%  94% 94%   General:  No distress Lungs:  Clear Heart:  RRR Abdomen:  Positive bowel sounds, no rebound no guarding Extremities:  No edema   LABS:  Results for orders placed or performed during the hospital encounter of 12/06/14 (from the past 24 hour(s))  Basic metabolic panel     Status: Abnormal   Collection Time: 12/07/14  4:00 AM  Result Value Ref Range   Sodium 137 135 - 145 mmol/L   Potassium 3.2 (L) 3.5 - 5.1 mmol/L   Chloride 95 (L) 96 - 112 mmol/L   CO2 33 (H) 19 - 32 mmol/L   Glucose, Bld 97 70 - 99 mg/dL   BUN 41 (H) 6 - 23 mg/dL   Creatinine, Ser 2.57 (H) 0.50 - 1.10 mg/dL   Calcium 9.2 8.4 - 10.5 mg/dL   GFR calc non Af Amer 16 (L) >90 mL/min   GFR calc Af Amer 18 (L) >90 mL/min   Anion gap 9 5 - 15    Intake/Output Summary (Last 24 hours) at 12/07/14 0729 Last data filed at 12/07/14 0600  Gross per 24 hour  Intake   1480 ml  Output   3125 ml  Net  -1645 ml   RENAL ULTRASOUND:  IMPRESSION: 1. Stable appearance to chronic severe hydronephrosis with regard to the lower pole moiety of the patient's left kidney. 2. Bilateral renal cysts noted. Otherwise, no evidence of hydronephrosis.  ASSESSMENT AND PLAN:  ACUTE ON CHRONIC DIASTOLIC HF:   No further diuresis with creatinine continuing to rise.  Follow creat in AM.  Consult renal in AM if creat up futher.    CKD:  Creat clearance is currently 16.5.  This is not a level at which Eliquis was used in clinical trials.  If creat continues at this level we will need to consider change to warfarin.  Renal ultrasound as above.   I will give an additional potassium  supplementation today  ELEVATED TROPONIN:  Mild elevation without any active symptoms of ischemia.  No ischemia work up.    Jeneen Rinks [facility] 12/07/2014 7:29 AM

## 2014-12-07 NOTE — Progress Notes (Signed)
Utilization Review Completed.   Ajwa Kimberley, RN, BSN Nurse Case Manager  

## 2014-12-07 NOTE — Plan of Care (Signed)
Problem: Phase I Progression Outcomes Goal: EF % per last Echo/documented,Core Reminder form on chart Outcome: Completed/Met Date Met:  12/07/14 EF60-65%(02-25-14)

## 2014-12-08 ENCOUNTER — Encounter (HOSPITAL_COMMUNITY): Payer: Self-pay | Admitting: Physician Assistant

## 2014-12-08 DIAGNOSIS — I5023 Acute on chronic systolic (congestive) heart failure: Secondary | ICD-10-CM

## 2014-12-08 LAB — BASIC METABOLIC PANEL
Anion gap: 9 (ref 5–15)
BUN: 39 mg/dL — ABNORMAL HIGH (ref 6–23)
CHLORIDE: 99 mmol/L (ref 96–112)
CO2: 31 mmol/L (ref 19–32)
Calcium: 9.5 mg/dL (ref 8.4–10.5)
Creatinine, Ser: 2.36 mg/dL — ABNORMAL HIGH (ref 0.50–1.10)
GFR calc Af Amer: 20 mL/min — ABNORMAL LOW (ref >90)
GFR calc non Af Amer: 17 mL/min — ABNORMAL LOW (ref >90)
Glucose, Bld: 84 mg/dL (ref 70–99)
Potassium: 3.9 mmol/L (ref 3.5–5.1)
SODIUM: 139 mmol/L (ref 135–145)

## 2014-12-08 NOTE — Progress Notes (Signed)
Patient Name: [name] Date of Encounter: 12/08/2014  Active Problems:   CHF, stage C   Acute on chronic renal insufficiency   Elevated troponin I level   Primary Cardiologist: Glori Bickers Patient Profile: 82 yr old female with a PMH of chronic diastolic congestive HF, A-fib w/ AV pacing, HTN, and frequent hospitalizations for HF exacerbations.  SUBJECTIVE: Today pt is feeling "a lot better". No complaints. Denies dyspnea, chest pain, cough, lightheadedness, N/V, and edema. Says that she often is discharged but ends up worsening and coming back a couple of days later, and wants to make sure that doesn't happen this time.  Patient lives alone but has [employer] come out once per week, and PT 1-2 times a week. Daughter lives 1 mile away and cooks low sodium dinner for her, and handles her meds. Other daughter comes and stays some nights. Pt does not know why she has recurrent exacerbations, but believes it may be when she eats sodium for breakfast or lunch, though she tried to be careful.  OBJECTIVE Filed Vitals:   12/07/14 2121 12/08/14 0209 12/08/14 0533 12/08/14 0547  BP: 104/40 92/32 88/38  92/52  Pulse: 60 59 60 61  Temp: 98.8 F (37.1 C) 97.8 F (36.6 C) 98 F (36.7 C)   TempSrc: Oral Oral Oral   Resp: 18 18 18    Height:      Weight:   129 lb 13.1 oz (58.885 kg)   SpO2: 94% 96% 94%     Intake/Output Summary (Last 24 hours) at 12/08/14 0903 Last data filed at 12/08/14 0557  Gross per 24 hour  Intake   1120 ml  Output   1625 ml  Net   -505 ml   Filed Weights   12/06/14 0508 12/07/14 0513 12/08/14 0533  Weight: 134 lb 1.6 oz (60.827 kg) 130 lb 11.7 oz (59.3 kg) 129 lb 13.1 oz (58.885 kg)    PHYSICAL EXAM General: Elderly female in no acute distress, sitting up on edge of bed and eating her breakfast. Head: Normocephalic, atraumatic Neck: Supple, no JVD. Lungs:  Resp regular and unlabored, CTA. Heart: RRR, S1, S2, no S3, S4, or murmur; no rub. Abdomen:  Soft, non-tender, non-distended, BS + x 4.  Extremities: No clubbing, cyanosis, edema. Peripheral pulses 2+ in all extremities.  Neuro: Alert and oriented X 3. Moves all extremities spontaneously. Psych: Normal affect.  LABS: CBC:  Recent Labs  12/06/14 0223  WBC 5.9  NEUTROABS 3.9  HGB 12.8  HCT 39.7  MCV 96.1  PLT 161*   Basic Metabolic Panel:  Recent Labs  12/07/14 0400 12/08/14 0245  NA 137 139  K 3.2* 3.9  CL 95* 99  CO2 33* 31  GLUCOSE 97 84  BUN 41* 39*  CREATININE 2.57* 2.36*  CALCIUM 9.2 9.5   Liver Function Tests:  Recent Labs  12/06/14 0500  AST 28  ALT 19  ALKPHOS 94  BILITOT 1.0  PROT 7.0  ALBUMIN 3.4*   Cardiac Enzymes:  Recent Labs  12/06/14 0223  TROPONINI 0.08*    Recent Labs  12/06/14 0206  TROPIPOC 0.09*   BNP: PRO B NATRIURETIC PEPTIDE (BNP)  Date/Time Value Ref Range Status  10/25/2014 09:55 AM 13191.0* 0 - 450 pg/mL Final  09/18/2014 01:17 PM 1874.0* 0.0 - 100.0 pg/mL Final   TELE: AV Pacing       Radiology/Studies: US Renal 12/06/2014   CLINICAL DATA:  Acute renal insufficiency.  Initial encounter.  EXAM: RENAL/URINARY TRACT  ULTRASOUND COMPLETE  COMPARISON:  CT of the abdomen and pelvis performed 07/20/2012  FINDINGS: Right Kidney:  Length: 10.3 cm. Echogenicity within normal limits. No hydronephrosis visualized. A 2.5 cm cyst noted at the upper pole of the right kidney.  Left Kidney:  Length: 11.5 cm. Echogenicity within normal limits. There is stable chronic severe hydronephrosis with regard to the lower pole moiety of the patient's left kidney. Two cysts are seen at the upper pole of the left kidney, measuring up to 1.9 cm in size.  Bladder:  Appears normal for degree of bladder distention.  IMPRESSION: 1. Stable appearance to chronic severe hydronephrosis with regard to the lower pole moiety of the patient's left kidney. 2. Bilateral renal cysts noted. Otherwise, no evidence of hydronephrosis.   Electronically Signed   By:  Garald Balding M.D.   On: 12/06/2014 22:57     Current Medications:  . amiodarone  200 mg Oral Daily  . apixaban  2.5 mg Oral BID  . levothyroxine  75 mcg Oral QAC breakfast  . metoprolol tartrate  25 mg Oral BID  . pantoprazole  40 mg Oral Daily  . simvastatin  10 mg Oral QHS  . sodium chloride  3 mL Intravenous Q12H      ASSESSMENT AND PLAN: [name] is an 81 year old female with a PMH of chronic diastolic congestive HF, A-fib w/ AV pacing, HTN, and frequent hospitalizations for HF exacerbations. She presented on 1/23 with dyspnea, dizziness, and orthopnea. She was admitted and given IV diuresis. Her Cr continued to rise, and diuresis discontinued on 1/24.  Active Problems:    CHF, stage C, improved --No dyspnea today, lungs clear on exam --No longer on diuresis d/t elevated Cr -- will need to restart home PO demedex ~tomorrow. --Cr improved, 2.57 yesterday to 2.36 today --Weight down 5 pounds from admission, 134-->129 lbs -- Dry weight is unclear, her weight is lower than previous values    Acute on chronic renal insufficiency --Baseline Cr from 1 month ago < 2.0 (1.41 on 12/17) --Improved today to 2.36 from 2.57 --Renal U/S 1/23 neg for hydronephrosis -- chronic / stable findings.     Elevated troponin I level --No chest pain, likely demand ischemia. No ischemia workup needed.     Hyopkalemia --K+ 3.9 today, improved --Monitor BMET for changes, give potassium supplementation as needed     Hypotension --Systolic in low 72'C past 24 hours, follow, try not to use IVF to maintain, should improve gradually; No room to add afterload reduction.    Chronic anticoagulation -- Currently tolerating Eliquis well --Cr clearance is 15.51, once CrCl below 15, dose of Eliquis is "undefined". At some point, may have to change to Warfarin for chronic anticoagulation - if renal Fxn remains reduced, would convert prior to d/c (no need to bridge)  Plan - ambulate and see how  tolerated. Recheck Cr in am, possible d/c in AM if doing well.   Signed, Harriet Masson, PA-S2  Rosaria Ferries , PA-C 9:03 AM 12/08/2014  I have seen, examined and evaluated the patient this AM along with Mr. Lowry Bowl & Ms. Ahmed Prima..  After reviewing all the available data and chart,  I agree with their findings, examination as well as impression recommendations.  Renal function trending in the right direction, but not yet back on PO diuretic -- probably restart tomorrow. Will need close OP f/u. Clinically seems stable, but frail -- need OOB & ambulate.  Only c/o MSK related back pain this AM.  Hopeful d/c in AM in renal Fxn continues to improve.    Leonie Man, M.D., M.S. Interventional Cardiologist   Pager # [phone]

## 2014-12-08 NOTE — Care Management Note (Addendum)
    Page 1 of 2   12/10/2014     4:31:39 PM CARE MANAGEMENT NOTE 12/10/2014  Patient:  [name], [name]   Account Number:  [id number]  Date Initiated:  12/06/2014  Documentation initiated by:  [facility]  Subjective/Objective Assessment:   CHF     Action/Plan:   Anticipated DC Date:  12/10/2014   Anticipated DC Plan:  [place]  CM consult      [facility] Choice  Resumption Of Svcs/PTA Provider   Choice offered to / List presented to:  C-1 Patient        [place] arranged  HH-1 RN  [place]   Status of service:  Completed, signed off Medicare Important Message given?  YES (If response is "NO", the following Medicare IM given date fields will be blank) Date Medicare IM given:  12/09/2014 Medicare IM given by:  Khaden Gater Date Additional Medicare IM given:   Additional Medicare IM given by:    Discharge Disposition:  [place]  Per UR Regulation:  Reviewed for med. necessity/level of care/duration of stay  If discussed at [place] of Stay Meetings, dates discussed:    Comments:  12/09/14 Ellan Lambert, RN, BSN [phone] Pt for dc home today with [facility] services as prior to admission.  Notified [facility] of dc date and resumption orders for [facility] care.  Start of care 24-48h post dc date.  12/08/14 Ellan Lambert, RN, BSN [phone] Pt adm on 12/06/14 with CHF exacerbation.  PTA, pt lives at home alone, has supportive daughter nearby.  Pt active with [facility] for HHRN/PT/OT, and pt states she is quite pleased with the care.  Will resume services as prior to admission.  Pt is seen at [facility].

## 2014-12-08 NOTE — Progress Notes (Signed)
UR completed 

## 2014-12-09 DIAGNOSIS — I5033 Acute on chronic diastolic (congestive) heart failure: Secondary | ICD-10-CM

## 2014-12-09 DIAGNOSIS — I10 Essential (primary) hypertension: Secondary | ICD-10-CM

## 2014-12-09 DIAGNOSIS — N183 Chronic kidney disease, stage 3 (moderate): Secondary | ICD-10-CM

## 2014-12-09 DIAGNOSIS — I480 Paroxysmal atrial fibrillation: Secondary | ICD-10-CM

## 2014-12-09 DIAGNOSIS — Z7901 Long term (current) use of anticoagulants: Secondary | ICD-10-CM

## 2014-12-09 LAB — BASIC METABOLIC PANEL
Anion gap: 12 (ref 5–15)
BUN: 40 mg/dL — ABNORMAL HIGH (ref 6–23)
CHLORIDE: 98 mmol/L (ref 96–112)
CO2: 28 mmol/L (ref 19–32)
CREATININE: 2.38 mg/dL — AB (ref 0.50–1.10)
Calcium: 9.2 mg/dL (ref 8.4–10.5)
GFR, EST AFRICAN AMERICAN: 20 mL/min — AB (ref >90)
GFR, EST NON AFRICAN AMERICAN: 17 mL/min — AB (ref >90)
GLUCOSE: 100 mg/dL — AB (ref 70–99)
POTASSIUM: 3.5 mmol/L (ref 3.5–5.1)
SODIUM: 138 mmol/L (ref 135–145)

## 2014-12-09 MED ORDER — TORSEMIDE 20 MG PO TABS: 40.0000 mg | ORAL_TABLET | Freq: Two times a day (BID) | ORAL | Status: DC

## 2014-12-09 NOTE — Progress Notes (Signed)
The patient complained of right flank pain overnight and stated that she believed she pulled a muscle when moving up in bed.  She was given heat packs and Tylenol with relief.  She was also given 0.25 mg of Ativan overnight for anxiety.

## 2014-12-09 NOTE — Discharge Summary (Signed)
Physician Discharge Summary  Patient ID: NATALIJA MAVIS MRN: [id number] DOB/AGE: Jul 16, 1925 81 y.o.   Primary Cardiologist: Dr. Haroldine Laws  Admit date: 12/06/2014 Discharge date: 12/09/2014  Admission Diagnoses: Acute on Chronic Diastolic CHF  Discharge Diagnoses:  Active Problems:   Atrial fibrillation   Hypertension   Acute on chronic diastolic CHF (congestive heart failure)   Chronic anticoagulation   Chronic renal disease, stage III   Acute on chronic renal insufficiency   Elevated troponin I level   Discharged Condition: stable  HPI: The patient is a 81 year old Caucasian female with past medical history of chronic diastolic congestive heart failure with EF of 60-65% on echo 02/2014, atrial fibrillation currently AV paced and on Eliquis, renal insufficiency and history of frequent admissions for CHF exacerbation, who presented to [facility] on 12/06/14 with complaints  of worsening shortness of breath and orthopnea. This was in the setting of recent dietary indiscretion with sodium. BNP was elevated at 947. Troponin also minimally elevated at 0.09 followed by 0.08 but patient denied chest pain. On physical exam she was noted to be volume overloaded. Admission weight was 134 lb.  She was admitted for IV diuretic therapy.    Hospital Course:  1. Acute on Chronic Diastolic CHF: Admission BNP was 947. Admission weight was 134 lbs. Breathing improved with diuresis. She was diuresed back down to 130 lb ( dry weight).  Due to elevated SCr, her diuretic was temporarily held for 2 days. Patient was instructed to resume home PO Demadex the day after discharge, 12/10/14. Continuation of 40 mg BID was recommended.  Low sodium diet and daily weights also advised. F/u arrange in HF clinic on 12/16/14.   2.  Acute on Chronic Renal Insufficiency: Patient's baseline SCr prior to admission was <2.0  (1.41 on 12/17 ). Admission Scr was 2.35. Renal ultrasound was ordered to evaluate and was negative for  hydronephrosis. No acute findings. Scr further increased with diuresis to 2.57, prompting temporary discontinuation of diuretic. Scr improved and plateuaed ~2.38 day of discharge. Patient instructed to resume PO diuretic, 40 mg of Demadex BID, day after discharge on 12/10/14. Patient ordered to discontinue PRN Metolazone until further notice by MD. Patient ordered to get f/u OP CMP on 12/11/14 to reassess renal function and electrolytes (order given for Home Health to obtain).   3. Elevated Troponin: minimally elevated x 2 at 0.09 and 0.08. No chest pain. Felt secondary to demand ischemia. No ischemia workup was indicated.   4. Hypokalemia: K as low as 3.2. Felt subsequent to diuretic therapy. Resolved with supplementation. K 3.5 day of discharge. Patient was resumed on home dose of supplemental K-Dur on discharge. Patient ordered for f/u CMP on 12/11/14.   5. Hypotension: - NOT shock. Unable to use Afterload reduction. Was placed on low dose BB. SBP stable at time of discharge in the 90s. Asymptomatic.    6. Chronic Anticoagulation: Patient was continued on Eliquis and was tolerating well. She was continued on low dose, 2.5 mg, given age > 80 and SCr >1.5.   Will defer to Dr. Haroldine Laws re: converting to Warfarin vs. Change to other NOAC.    Patient was last seen and examined by Dr. Ellyn Hack who determined she was stable for discharge. F/u arranged at HF clinic 12/16/14 at 3:40 pm.     Consults: None  Significant Diagnostic Studies:   Renal Ultrasound 12/06/14 12/06/2014 CLINICAL DATA: Acute renal insufficiency. Initial encounter. EXAM: RENAL/URINARY TRACT ULTRASOUND COMPLETE COMPARISON: CT of the abdomen and pelvis performed  07/20/2012 FINDINGS: Right Kidney: Length: 10.3 cm. Echogenicity within normal limits. No hydronephrosis visualized. A 2.5 cm cyst noted at the upper pole of the right kidney. Left Kidney: Length: 11.5 cm. Echogenicity within normal limits. There is stable chronic severe  hydronephrosis with regard to the lower pole moiety of the patient's left kidney. Two cysts are seen at the upper pole of the left kidney, measuring up to 1.9 cm in size. Bladder: Appears normal for degree of bladder distention. IMPRESSION: 1. Stable appearance to chronic severe hydronephrosis with regard to the lower pole moiety of the patient's left kidney. 2. Bilateral renal cysts noted. Otherwise, no evidence of hydronephrosis. Electronically Signed By: Garald Balding M.D. On: 12/06/2014 22:57   Treatments: See Hospital Course  Discharge Exam: Blood pressure 91/50, pulse 60, temperature 97.8 F (36.6 C), temperature source Oral, resp. rate 18, height 4\' 10"  (1.473 m), weight 132 lb 15 oz (60.3 kg), SpO2 96 %.   Disposition: 06-Home-Health Care Svc      Discharge Instructions    Diet - low sodium heart healthy    Complete by:  As directed      Discharge instructions    Complete by:  As directed   Resume Torsemide (Demadex) on 12/10/14. You will need to get lab work done on 12/11/14. Home Health RN will need to draw blood. Stop taking Metolazone (Zaroxolyn), until directed by MD     Increase activity slowly    Complete by:  As directed             Medication List    STOP taking these medications        metolazone 2.5 MG tablet  Commonly known as:  ZAROXOLYN      TAKE these medications        acetaminophen 325 MG tablet  Commonly known as:  TYLENOL  Take 2 tablets (650 mg total) by mouth every 4 (four) hours as needed for headache or mild pain.     amiodarone 200 MG tablet  Commonly known as:  PACERONE  Take 1 tablet (200 mg total) by mouth daily.     apixaban 2.5 MG Tabs tablet  Commonly known as:  ELIQUIS  Take 1 tablet (2.5 mg total) by mouth 2 (two) times daily.     ferrous sulfate 325 (65 FE) MG tablet  Take 1 tablet (325 mg total) by mouth 2 (two) times daily with a meal.     levothyroxine 75 MCG tablet  Commonly known as:  SYNTHROID, LEVOTHROID    Take 1 tablet (75 mcg total) by mouth daily.     loratadine 10 MG tablet  Commonly known as:  CLARITIN  Take 10 mg by mouth daily as needed for allergies.     LORazepam 0.5 MG tablet  Commonly known as:  ATIVAN  Take 0.5-1 tablets (0.25-0.5 mg total) by mouth every 8 (eight) hours as needed for anxiety.     metoprolol tartrate 25 MG tablet  Commonly known as:  LOPRESSOR  Take 25 mg by mouth 2 (two) times daily.     nitroGLYCERIN 0.4 MG SL tablet  Commonly known as:  NITROSTAT  Place 1 tablet (0.4 mg total) under the tongue every 5 (five) minutes as needed. For chest pain     pantoprazole 40 MG tablet  Commonly known as:  PROTONIX  Take 1 tablet (40 mg total) by mouth daily.     potassium chloride SA 20 MEQ tablet  Commonly known as:  KLOR-CON M20  Take 2 tablets (40 mEq total) by mouth 2 (two) times daily.     senna 8.6 MG Tabs tablet  Commonly known as:  SENOKOT  Take 2 tablets (17.2 mg total) by mouth daily as needed for mild constipation.     simvastatin 10 MG tablet  Commonly known as:  ZOCOR  Take 1 tablet (10 mg total) by mouth at bedtime.     torsemide 20 MG tablet  Commonly known as:  DEMADEX  Take 2 tablets (40 mg total) by mouth 2 (two) times daily.  Start taking on:  12/10/2014       Follow-up Information    Follow up with [facility] On 12/16/2014.   Specialty:  Cardiology   Why:  Heart Failure Clinic Follow-up @ 3:40 pm    Contact information:   [address] [id number] [place] [phone]      Follow up with Home Health On 12/11/2014.   Why:  For Home Health: labs needed for CMP      TIME SPENT ON DISCHARGE, INCLUDING PHYSICIAN TIME: >30 MINUTES  Signed: Mazikeen Hehn 12/09/2014, 11:02 AM

## 2014-12-09 NOTE — Progress Notes (Signed)
Patient Name: [name] Date of Encounter: 12/09/2014  Active Problems:   Acute on chronic diastolic CHF (congestive heart failure)   Chronic renal disease, stage III   Acute on chronic renal insufficiency   Elevated troponin I level   Atrial fibrillation   Hypertension   Chronic anticoagulation   Primary Cardiologist: Glori Bickers Patient Profile: 81 yr old female with a PMH of chronic diastolic congestive HF, A-fib w/ AV pacing, HTN, and frequent hospitalizations for HF exacerbations. Patient lives alone but has [facility] come out once per week, and PT 1-2 times a week. Daughter lives 1 mile away and cooks low sodium dinner for her, and handles her meds. Other daughter comes and stays some nights. Pt does not know why she has recurrent exacerbations, but believes it may be when she eats sodium for breakfast or lunch, though she tried to be careful.  SUBJECTIVE: Today pt is still feeling "a lot better" from a breathing standpoint. No complaints. Denies dyspnea, chest pain, cough, lightheadedness, N/V, and edema.  R sided back discomfort from sleeping "funny"  OBJECTIVE Filed Vitals:   12/08/14 2054 12/09/14 0503 12/09/14 0559 12/09/14 0935  BP: 109/50 96/43 96/46  91/50  Pulse: 63 62  60  Temp: 98.1 F (36.7 C) 97.4 F (36.3 C)  97.8 F (36.6 C)  TempSrc: Oral Oral  Oral  Resp: 16 19  18   Height:      Weight:  132 lb 15 oz (60.3 kg)    SpO2: 98% 96%  96%    Intake/Output Summary (Last 24 hours) at 12/09/14 0946 Last data filed at 12/09/14 0547  Gross per 24 hour  Intake   1400 ml  Output   1575 ml  Net   -175 ml   Filed Weights   12/07/14 0513 12/08/14 0533 12/09/14 0503  Weight: 130 lb 11.7 oz (59.3 kg) 129 lb 13.1 oz (58.885 kg) 132 lb 15 oz (60.3 kg)  Dry wgt ~ 130-132lb  PHYSICAL EXAM General: Elderly female in no acute distress, sitting up on edge of bed. Head: Normocephalic, atraumatic Neck: Supple, no JVD. Lungs:  Resp regular and unlabored,  CTA. Heart: RRR, S1& S2, no S3, S4, or murmur; no rub. Occasional ectopy Abdomen: Soft, non-tender, non-distended, BS + x 4.  Extremities: No clubbing, cyanosis, edema. Peripheral pulses 2+ in all extremities.  Neuro: Alert and oriented X 3. Moves all extremities spontaneously. Psych: Normal affect.  LABS: CBC: No results for input(s): WBC, NEUTROABS, HGB, HCT, MCV, PLT in the last 72 hours. Basic Metabolic Panel:  Recent Labs  12/08/14 0245 12/09/14 0345  NA 139 138  K 3.9 3.5  CL 99 98  CO2 31 28  GLUCOSE 84 100*  BUN 39* 40*  CREATININE 2.36* 2.38*  CALCIUM 9.5 9.2   BNP: PRO B NATRIURETIC PEPTIDE (BNP)  Date/Time Value Ref Range Status  10/25/2014 09:55 AM 13191.0* 0 - 450 pg/mL Final  09/18/2014 01:17 PM 1874.0* 0.0 - 100.0 pg/mL Final   TELE: AV Pacing       Radiology/Studies: US Renal 12/06/2014   CLINICAL DATA:  Acute renal insufficiency.  Initial encounter.  EXAM: RENAL/URINARY TRACT ULTRASOUND COMPLETE  COMPARISON:  CT of the abdomen and pelvis performed 07/20/2012  FINDINGS: Right Kidney:  Length: 10.3 cm. Echogenicity within normal limits. No hydronephrosis visualized. A 2.5 cm cyst noted at the upper pole of the right kidney.  Left Kidney:  Length: 11.5 cm. Echogenicity within normal limits. There is stable chronic  severe hydronephrosis with regard to the lower pole moiety of the patient's left kidney. Two cysts are seen at the upper pole of the left kidney, measuring up to 1.9 cm in size.  Bladder:  Appears normal for degree of bladder distention.  IMPRESSION: 1. Stable appearance to chronic severe hydronephrosis with regard to the lower pole moiety of the patient's left kidney. 2. Bilateral renal cysts noted. Otherwise, no evidence of hydronephrosis.   Electronically Signed   By: Garald Balding M.D.   On: 12/06/2014 22:57   Current Medications:  . amiodarone  200 mg Oral Daily  . apixaban  2.5 mg Oral BID  . levothyroxine  75 mcg Oral QAC breakfast  . metoprolol  tartrate  25 mg Oral BID  . pantoprazole  40 mg Oral Daily  . simvastatin  10 mg Oral QHS  . sodium chloride  3 mL Intravenous Q12H      ASSESSMENT AND PLAN: [name] is an 81 year old female with a PMH of chronic diastolic congestive HF, A-fib w/ AV pacing, HTN, and frequent hospitalizations for HF exacerbations. She presented on 1/23 with dyspnea, dizziness, and orthopnea. She was admitted and given IV diuresis. Her Cr continued to rise, and diuresis discontinued on 1/24.  Diuretics held over the weekend for worsening renal function -- suggests that we achieved dry wgt.  Active Problems:    Acute on Chronic diastolic CHF, stage C, improved --No dyspnea today, lungs clear on exam --No longer on diuresis d/t elevated Cr -- will need to restart home PO demedex ~tomorrow. --Cr improved, 2.57 yesterday to 2.36 - 2.38 today @ plateua --Weight down 5 pounds from admission, 134-->129 lbs --> 132 (suspect dry wgt is ~130 lb) Restart home dose of Demedex tomorrow. Has CHF clinic f/u appt 2/2 @ 1540 hr.    Acute on chronic renal insufficiency --Baseline Cr from 1 month ago < 2.0 (1.41 on 12/17), but ~1.8 ish. -- Stable Cr x 2 days -- would recheck labs on Thursday 1/28. --Renal U/S 1/23 neg for hydronephrosis -- chronic / stable findings.     Elevated troponin I level --No chest pain, likely demand ischemia. No ischemia workup needed.     Hyopkalemia --K+ 3.9 today, improved --Monitor BMET for changes, give potassium supplementation as needed     Hypotension - NOT shock.  -- Unable to use Afterload reduction. On low dose BB     Chronic anticoagulation -- Currently tolerating Eliquis well - will defer to Dr. Haroldine Laws re: converting to Warfarin vs. Change to other NOAC.  Plan - ambulate and see how tolerated. Anticipate d/c today if doing well.    Leonie Man, M.D., M.S. Interventional Cardiologist   Pager # [phone]

## 2014-12-10 ENCOUNTER — Telehealth: Payer: Self-pay | Admitting: Internal Medicine

## 2014-12-10 NOTE — Telephone Encounter (Signed)
PLEASE NOTE: All timestamps contained within this report are represented as [place] Standard Time. CONFIDENTIALTY NOTICE: This fax transmission is intended only for the addressee. It contains information that is legally privileged, confidential or otherwise protected from use or disclosure. If you are not the intended recipient, you are strictly prohibited from reviewing, disclosing, copying using or disseminating any of this information or taking any action in reliance on or regarding this information. If you have received this fax in error, please notify us immediately by telephone so that we can arrange for its return to [place]. Phone: [phone], Toll-Free: [phone], Fax: [phone] Page: 1 of 1 Call Id: [id number] [name] - Client [name] Patient Name: [name] DOB: [birth year] Initial Comment caller states she has pain on right side abdomen Nurse Assessment Nurse: [name], RN, [name] Date/Time [name] Time): 12/10/2014 9:48:28 AM Confirm and document reason for call. If symptomatic, describe symptoms. ---caller states she has pain on right side abdomen. Pain starts in Right hip, then progresses to stomach and trunk area. (was in the hospital until last nite, and has been home since last night)Afebrile. No Dickson/V/D. Last BM yesterday, which was normal. Pt states she had a trapeze bar in the hospital, was hoisting herself and that is when the "muscle pain in the chest area" Has the patient traveled out of the country within the last 30 days? ---No Does the patient require triage? ---Yes Related visit to physician within the last 2 weeks? ---Yes Does the PT have any chronic conditions? (i.e. diabetes, asthma, etc.) ---Yes List chronic conditions. ---kidney "issues", heart (afib), BP "problems", this morning was 120/70 Guidelines Guideline Title Affirmed Question Affirmed Notes Chest Injury [1]  Chest wall swelling, bruise or pain AND [2] present < 7 days Final Disposition User [name], RN, [name]

## 2014-12-10 NOTE — Telephone Encounter (Signed)
Ps advise

## 2014-12-10 NOTE — Telephone Encounter (Signed)
Given to [place] for review.  Unsure if I should call the pt or she was given home care advise.

## 2014-12-11 ENCOUNTER — Encounter: Payer: Self-pay | Admitting: Internal Medicine

## 2014-12-15 ENCOUNTER — Telehealth: Payer: Self-pay | Admitting: Internal Medicine

## 2014-12-15 NOTE — Telephone Encounter (Signed)
New Msg       Pt daughter calling, pt isn't sure what to expect during remote pacer check.    Please call to explain process pt.

## 2014-12-15 NOTE — Telephone Encounter (Signed)
Spoke w/ pt and informed her that transmission was automatic and that it will check her while she sleeps between 12 midnight and 5 AM. Informed pt that if transmission is not received we will call her and walk her through a manual transmission. Pt verbalized understanding.

## 2014-12-16 ENCOUNTER — Ambulatory Visit (HOSPITAL_COMMUNITY)
Admit: 2014-12-16 | Discharge: 2014-12-16 | Disposition: A | Discharge status: Home / Self Care | Payer: Medicare Other | Source: Ambulatory Visit | Attending: Internal Medicine | Admitting: Internal Medicine

## 2014-12-16 ENCOUNTER — Encounter (HOSPITAL_COMMUNITY): Payer: Self-pay

## 2014-12-16 VITALS — BP 90/46 | HR 74 | Wt 137.1 lb

## 2014-12-16 DIAGNOSIS — N183 Chronic kidney disease, stage 3 unspecified: Secondary | ICD-10-CM

## 2014-12-16 DIAGNOSIS — I10 Essential (primary) hypertension: Secondary | ICD-10-CM | POA: Insufficient documentation

## 2014-12-16 DIAGNOSIS — I480 Paroxysmal atrial fibrillation: Secondary | ICD-10-CM | POA: Diagnosis not present

## 2014-12-16 DIAGNOSIS — I5032 Chronic diastolic (congestive) heart failure: Secondary | ICD-10-CM | POA: Insufficient documentation

## 2014-12-16 MED ORDER — METOLAZONE 2.5 MG PO TABS: 2.5000 mg | ORAL_TABLET | ORAL | Status: DC | PRN

## 2014-12-16 NOTE — Addendum Note (Signed)
Encounter addended by: Scarlette Calico, RN on: 12/16/2014  4:13 PM<BR>     Documentation filed: Dx Association, Patient Instructions Section, Orders

## 2014-12-16 NOTE — Progress Notes (Signed)
Patient ID: [name], female   DOB: 11-24-1924, 81 y.o.   MRN: [id number]  PCP: Dr Netty Starring Primary Cardiologist: Dr. Harrington Challenger  HPI: [name] is a 81 y.o. female with the history of nonobstructive CAD by cardiac catheterization in 01/2014, persistent atrial fibrillation, diastolic CHF, HTN, HL, anemia, and CKD. Patient was admitted 04/2014 x2 with acute on chronic diastolic CHF. She underwent cardioversion with restoration of NSR. This resulted in profound bradycardia and hypotension requiring pacemaker implantation. Pacemaker implant was complicated by acute blood loss anemia requiring transfusion with PRBCs  Admitted to [facility] August 21 with increased dyspnea. Diuresed with IV lasix and transitioned to po lasix 40 mg twice a day. GI evaluated due to anemia and black stool. Had EGD with no acute findings. Colonoscopy was cancelled per daughter due to multiple medical problems. She continued on eliquis 2.5 mg twice a day.  Palliative Care consulted and the patient and family request aggressive care for heart failure. Discharge weight was 152 pounds.   Admitted to [facility] 11/9 after RHC due to elevated filling pressures. Once diuresed she was put on torsemide 20 mg twice a day. She was also restarted on amiodarone 200 mg daily and continue on eliquis 2.5 mg twice a day. Discharge weight was 141 pounds.   Admitted 12/12 to 12/17 with recurrent HF and AF. Admit weight was 136. Was diuresed to 131. Diuresis limited by low BP. Demadex increased from 20 bid to 40 bid.   Admitted 12/06/14 - 12/09/14 for ADHF. Initial weight 134 pounds. Diuresed down to 130 and felt better. Treatment limited due to hypotension and worsening renal failure with creatinine up to 2.6. Renal u/s ok. Metolazone stopped.  She return for follow up: Here with daughter. At home weight has been 132-133. Overall she feels better. Breathing better. No orthopnea or PND. Edema improved. Continues on torsemide 40 bid. Not taking metolazone.  No dizziness.  Following a low salt diet and drinking less than 2L a day. No bleeding problems.   Studies:  - LHC (3/15): Mid LM 20-30%, mid RCA 30-40%, EF 65%  - Echo (4/15): Mild LVH, EF 60-65%, no RWMA, mild BAE, trivial eff  - Carotid US (5/15): 40-59% RICA stenosis. 7-26% LICA stenosis.   RHC 09/22/14  RA = 11 RV = 49/6/10 PA = 49/22 (34) PCW = 18 (v = 25) Fick cardiac output/index = 6.4/4.0 PVR = 2.5 WU FA sat = 95% PA sat = 72%,74%  Labs 07/18/14 K 3.5 Creatinine 1.44 Hgb 10.7  Labs 07/14/14 K 4.1 Creatinine 1.46  Hgb 9.8  Labs 07/04/14 TSH 2.7 Labs 09/06/14: K 4.0, creatinine 1.93 Labs 09/15/14 K 4.6 Creatinine 3.9  Labs 09/26/14 K 3.6 Creatinine 1.61 hgb 13.7 Labs 10/01/14 K 4.4 Creatinine 1.7 Hgb 14/1  Labs 10/30/14 K 3.9 Creatinine 1.4   ROS: All systems negative except as listed in HPI, PMH and Problem List.  Past Medical History  Diagnosis Date  . Dyslipidemia   . Hypertension   . Palpitations     PVCs/bigeminy on event in May 2009 revealing this was relatively asymptomatic  . Chest pain     a. 2011 Neg MV;  b. 01/2014 Cath: LM 20-30, LAD nl, D1 nl, LCX nl, OM1 nl, RCA dom 30-26m, PD/PL nl, EF 65%->Med Rx.  . Degenerative joint disease   . Hx of varicella   . History of skin cancer     tafeen/ non melanoma.   . Thrombocytopenia   . Anemia   .  Monoclonal gammopathies   . Right lower quadrant abdominal pain 07/19/2012    Recurrent  With nausea    This is new since she had ct for llq pain in [facility]    R/o appendiceal problem  Hernia  Get ct scan  And plan  Fu     . Diastolic CHF, acute on chronic 01/31/2012  . Atrial fibrillation     a. Dx 01/2014->Eliquis started.  . Cancer   . Pacemaker   . Dysrhythmia     Current Outpatient Prescriptions  Medication Sig Dispense Refill  . acetaminophen (TYLENOL) 325 MG tablet Take 2 tablets (650 mg total) by mouth every 4 (four) hours as needed for headache or mild pain.    Marland Kitchen amiodarone (PACERONE) 200 MG tablet Take 1 tablet  (200 mg total) by mouth daily. 30 tablet 6  . apixaban (ELIQUIS) 2.5 MG TABS tablet Take 1 tablet (2.5 mg total) by mouth 2 (two) times daily. 60 tablet 0  . ferrous sulfate 325 (65 FE) MG tablet Take 1 tablet (325 mg total) by mouth 2 (two) times daily with a meal. 180 tablet 1  . levothyroxine (SYNTHROID, LEVOTHROID) 75 MCG tablet Take 1 tablet (75 mcg total) by mouth daily. 90 tablet 1  . loratadine (CLARITIN) 10 MG tablet Take 10 mg by mouth daily as needed for allergies.     Marland Kitchen LORazepam (ATIVAN) 0.5 MG tablet Take 0.5-1 tablets (0.25-0.5 mg total) by mouth every 8 (eight) hours as needed for anxiety. 30 tablet 1  . metoprolol tartrate (LOPRESSOR) 25 MG tablet Take 25 mg by mouth 2 (two) times daily.   3  . nitroGLYCERIN (NITROSTAT) 0.4 MG SL tablet Place 1 tablet (0.4 mg total) under the tongue every 5 (five) minutes as needed. For chest pain 25 tablet 6  . pantoprazole (PROTONIX) 40 MG tablet Take 1 tablet (40 mg total) by mouth daily. 30 tablet 5  . potassium chloride SA (KLOR-CON M20) 20 MEQ tablet Take 2 tablets (40 mEq total) by mouth 2 (two) times daily. 120 tablet 3  . senna (SENOKOT) 8.6 MG TABS tablet Take 2 tablets (17.2 mg total) by mouth daily as needed for mild constipation. 30 tablet 6  . simvastatin (ZOCOR) 10 MG tablet Take 1 tablet (10 mg total) by mouth at bedtime. 30 tablet 6  . torsemide (DEMADEX) 20 MG tablet Take 2 tablets (40 mg total) by mouth 2 (two) times daily. 120 tablet 3   No current facility-administered medications for this encounter.      Filed Vitals:   12/16/14 1549  BP: 90/46  Pulse: 74  Weight: 137 lb 1.9 oz (62.197 kg)  SpO2: 97%     PHYSICAL EXAM: General:  Elderly Frail appearing. No resp difficulty. Daughter present HEENT: normal except R cheek bandaide noted.    Neck: supple. JVP 6 . Carotids 2+ bilaterally; no bruits. No lymphadenopathy or thryomegaly appreciated. Cor: PMI normal. Regular  rate &  rhythm. No rubs, gallops or  murmurs. Lungs: clear Abdomen: soft, nontender, nondistended. No hepatosplenomegaly. No bruits or masses. Good bowel sounds. Extremities: no cyanosis, clubbing, rash, R and LLE severe varicose veins trace edema Neuro: alert & orientedx3, cranial nerves grossly intact. Moves all 4 extremities w/o difficulty. Affect pleasant.   ASSESSMENT & PLAN:  1. Chronic Diastolic Heart Failure - She has a very narrow euvolemic window.  -Continue torsemide 40 mg twice a day.  Take metolazone only for weight 136 or greater. Watch renal function carefully. Continue TED hose -  Reinforced the need and importance of daily weights, a low sodium diet, and fluid restriction (less than 2 L a day). Instructed to call the HF clinic if weight increases more than 3 lbs overnight or 5 lbs in a week.  2.PAF -  symptomatic bradycardia s/p PPM.  previously thought to have failed amiodarone and had chronic AF. However in November in the hospital she was in NSR so amio restarted. - Continue Eliquis 2.5 mg twice a day. No bleeding problems. Noted.  3. HTN- Ok today.  Continue current meds.  4. CKD stage III - check BMET today  Follow up in 2 weeks   Glori Bickers MD  3:59 PM

## 2014-12-16 NOTE — Patient Instructions (Signed)
Take Metolazone if weight is 136 lb or greater  Lab today  Your physician recommends that you schedule a follow-up appointment in: 2 weeks

## 2014-12-17 ENCOUNTER — Encounter (HOSPITAL_COMMUNITY): Payer: Self-pay | Admitting: Internal Medicine

## 2014-12-17 ENCOUNTER — Ambulatory Visit (INDEPENDENT_AMBULATORY_CARE_PROVIDER_SITE_OTHER): Payer: Medicare Other | Admitting: *Deleted

## 2014-12-17 DIAGNOSIS — R001 Bradycardia, unspecified: Secondary | ICD-10-CM

## 2014-12-17 NOTE — Progress Notes (Signed)
Remote pacemaker transmission.   

## 2014-12-18 ENCOUNTER — Telehealth (HOSPITAL_COMMUNITY): Payer: Self-pay | Admitting: *Deleted

## 2014-12-18 LAB — MDC_IDC_ENUM_SESS_TYPE_REMOTE
Battery Remaining Percentage: 95.5 %
Brady Statistic AP VS Percent: 1 %
Brady Statistic AS VP Percent: 6.3 %
Brady Statistic AS VS Percent: 1 %
Brady Statistic RA Percent Paced: 87 %
Date Time Interrogation Session: 20160203090017
Implantable Pulse Generator Model: 2240
Implantable Pulse Generator Serial Number: 7627922
Lead Channel Impedance Value: 450 Ohm
Lead Channel Impedance Value: 530 Ohm
Lead Channel Pacing Threshold Amplitude: 1.25 V
Lead Channel Pacing Threshold Pulse Width: 0.4 ms
Lead Channel Setting Pacing Amplitude: 2 V
Lead Channel Setting Pacing Amplitude: 2.5 V
Lead Channel Setting Pacing Pulse Width: 0.4 ms
MDC IDC MSMT BATTERY REMAINING LONGEVITY: 106 mo
MDC IDC MSMT BATTERY VOLTAGE: 3.01 V
MDC IDC MSMT LEADCHNL RA PACING THRESHOLD AMPLITUDE: 0.75 V
MDC IDC MSMT LEADCHNL RA PACING THRESHOLD PULSEWIDTH: 0.4 ms
MDC IDC MSMT LEADCHNL RA SENSING INTR AMPL: 3.5 mV
MDC IDC MSMT LEADCHNL RV SENSING INTR AMPL: 3.2 mV
MDC IDC SET LEADCHNL RV SENSING SENSITIVITY: 2 mV
MDC IDC STAT BRADY AP VP PERCENT: 89 %
MDC IDC STAT BRADY RV PERCENT PACED: 95 %

## 2014-12-18 NOTE — Telephone Encounter (Signed)
[employer] member ID [id number], have gotten pt's Torsemide 40 mg Twice daily (120 tabs/30 days) approved 12/16/14-12/17/15 after appeal, [place] aware and state it ran through and it will cost $14.31, left pt mess that med had been approved

## 2014-12-23 ENCOUNTER — Other Ambulatory Visit: Payer: Self-pay | Admitting: Internal Medicine

## 2014-12-23 ENCOUNTER — Telehealth (HOSPITAL_COMMUNITY): Payer: Self-pay | Admitting: Vascular Surgery

## 2014-12-23 NOTE — Telephone Encounter (Signed)
Patient gained 6 lbs over weekend, had BLEE, took her PRN 2.5mg  metolazone yesterday, lost 7 lbs of fluid in 24 hrs.  Now dizzy and fatigued, no SOB, otherwise feels okay.  Advised per Darrick Grinder NP-C to hold her afternoon dose of torsemide, decrease activity and be slow to move today to prevent falls.  Will call us tomorrow for update on how she feels and weight.  Aware and agreeable.  [name]

## 2014-12-23 NOTE — Telephone Encounter (Signed)
Pt called she does not feel good, SOB, cant hardly walk, she feels really tired like she want to got to sleep. Please advise

## 2014-12-24 NOTE — Telephone Encounter (Signed)
Ok to continue this medication   dont want her to run out   But ask  cardiology if they can take this medicine refills for safety of care ( because of complexity  of her cardiovascular problem )

## 2014-12-24 NOTE — Telephone Encounter (Signed)
Should the pt continue this medication?

## 2014-12-26 ENCOUNTER — Emergency Department (HOSPITAL_COMMUNITY): Payer: Medicare Other

## 2014-12-26 ENCOUNTER — Encounter (HOSPITAL_COMMUNITY): Payer: Self-pay | Admitting: Emergency Medicine

## 2014-12-26 ENCOUNTER — Emergency Department (HOSPITAL_COMMUNITY)
Admission: EM | Admit: 2014-12-26 | Discharge: 2014-12-26 | Disposition: A | Discharge status: Home / Self Care | Payer: Medicare Other | Attending: Emergency Medicine | Admitting: Emergency Medicine

## 2014-12-26 DIAGNOSIS — D649 Anemia, unspecified: Secondary | ICD-10-CM | POA: Diagnosis not present

## 2014-12-26 DIAGNOSIS — I5030 Unspecified diastolic (congestive) heart failure: Secondary | ICD-10-CM

## 2014-12-26 DIAGNOSIS — Z9841 Cataract extraction status, right eye: Secondary | ICD-10-CM | POA: Insufficient documentation

## 2014-12-26 DIAGNOSIS — I4891 Unspecified atrial fibrillation: Secondary | ICD-10-CM | POA: Insufficient documentation

## 2014-12-26 DIAGNOSIS — I5032 Chronic diastolic (congestive) heart failure: Secondary | ICD-10-CM

## 2014-12-26 DIAGNOSIS — Z950 Presence of cardiac pacemaker: Secondary | ICD-10-CM | POA: Insufficient documentation

## 2014-12-26 DIAGNOSIS — Z8739 Personal history of other diseases of the musculoskeletal system and connective tissue: Secondary | ICD-10-CM | POA: Insufficient documentation

## 2014-12-26 DIAGNOSIS — Z9842 Cataract extraction status, left eye: Secondary | ICD-10-CM | POA: Diagnosis not present

## 2014-12-26 DIAGNOSIS — Z79899 Other long term (current) drug therapy: Secondary | ICD-10-CM | POA: Diagnosis not present

## 2014-12-26 DIAGNOSIS — R079 Chest pain, unspecified: Secondary | ICD-10-CM

## 2014-12-26 DIAGNOSIS — I10 Essential (primary) hypertension: Secondary | ICD-10-CM | POA: Insufficient documentation

## 2014-12-26 DIAGNOSIS — R0789 Other chest pain: Secondary | ICD-10-CM | POA: Diagnosis not present

## 2014-12-26 DIAGNOSIS — R5383 Other fatigue: Secondary | ICD-10-CM | POA: Insufficient documentation

## 2014-12-26 DIAGNOSIS — Z85828 Personal history of other malignant neoplasm of skin: Secondary | ICD-10-CM | POA: Diagnosis not present

## 2014-12-26 DIAGNOSIS — R0602 Shortness of breath: Secondary | ICD-10-CM | POA: Diagnosis not present

## 2014-12-26 DIAGNOSIS — Z9889 Other specified postprocedural states: Secondary | ICD-10-CM | POA: Diagnosis not present

## 2014-12-26 DIAGNOSIS — D696 Thrombocytopenia, unspecified: Secondary | ICD-10-CM | POA: Insufficient documentation

## 2014-12-26 DIAGNOSIS — E785 Hyperlipidemia, unspecified: Secondary | ICD-10-CM | POA: Diagnosis not present

## 2014-12-26 DIAGNOSIS — R072 Precordial pain: Secondary | ICD-10-CM

## 2014-12-26 DIAGNOSIS — Z8619 Personal history of other infectious and parasitic diseases: Secondary | ICD-10-CM | POA: Diagnosis not present

## 2014-12-26 DIAGNOSIS — R110 Nausea: Secondary | ICD-10-CM

## 2014-12-26 LAB — I-STAT CHEM 8, ED
BUN: 39 mg/dL — AB (ref 6–23)
BUN: 40 mg/dL — ABNORMAL HIGH (ref 6–23)
CALCIUM ION: 1.09 mmol/L — AB (ref 1.13–1.30)
CHLORIDE: 100 mmol/L (ref 96–112)
CREATININE: 2.3 mg/dL — AB (ref 0.50–1.10)
CREATININE: 2.3 mg/dL — AB (ref 0.50–1.10)
Calcium, Ion: 1.12 mmol/L — ABNORMAL LOW (ref 1.13–1.30)
Chloride: 100 mmol/L (ref 96–112)
Glucose, Bld: 105 mg/dL — ABNORMAL HIGH (ref 70–99)
Glucose, Bld: 97 mg/dL (ref 70–99)
HCT: 38 % (ref 36.0–46.0)
HCT: 38 % (ref 36.0–46.0)
Hemoglobin: 12.9 g/dL (ref 12.0–15.0)
Hemoglobin: 12.9 g/dL (ref 12.0–15.0)
Potassium: 3.7 mmol/L (ref 3.5–5.1)
Potassium: 4 mmol/L (ref 3.5–5.1)
Sodium: 141 mmol/L (ref 135–145)
Sodium: 141 mmol/L (ref 135–145)
TCO2: 27 mmol/L (ref 0–100)
TCO2: 28 mmol/L (ref 0–100)

## 2014-12-26 LAB — BASIC METABOLIC PANEL
ANION GAP: 10 (ref 5–15)
BUN: 40 mg/dL — ABNORMAL HIGH (ref 6–23)
CO2: 29 mmol/L (ref 19–32)
Calcium: 9.3 mg/dL (ref 8.4–10.5)
Chloride: 100 mmol/L (ref 96–112)
Creatinine, Ser: 2.53 mg/dL — ABNORMAL HIGH (ref 0.50–1.10)
GFR calc Af Amer: 19 mL/min — ABNORMAL LOW (ref >90)
GFR calc non Af Amer: 16 mL/min — ABNORMAL LOW (ref >90)
GLUCOSE: 116 mg/dL — AB (ref 70–99)
Potassium: 3.9 mmol/L (ref 3.5–5.1)
Sodium: 139 mmol/L (ref 135–145)

## 2014-12-26 LAB — CBC
HEMATOCRIT: 37.8 % (ref 36.0–46.0)
Hemoglobin: 12.2 g/dL (ref 12.0–15.0)
MCH: 31.7 pg (ref 26.0–34.0)
MCHC: 32.3 g/dL (ref 30.0–36.0)
MCV: 98.2 fL (ref 78.0–100.0)
Platelets: 129 10*3/uL — ABNORMAL LOW (ref 150–400)
RBC: 3.85 MIL/uL — ABNORMAL LOW (ref 3.87–5.11)
RDW: 14.8 % (ref 11.5–15.5)
WBC: 5.4 10*3/uL (ref 4.0–10.5)

## 2014-12-26 LAB — I-STAT TROPONIN, ED
Troponin i, poc: 0.08 ng/mL (ref 0.00–0.08)
Troponin i, poc: 0.11 ng/mL (ref 0.00–0.08)

## 2014-12-26 LAB — I-STAT CG4 LACTIC ACID, ED
Lactic Acid, Venous: 1.96 mmol/L (ref 0.5–2.0)
Lactic Acid, Venous: 0.96 mmol/L (ref 0.5–2.0)

## 2014-12-26 LAB — BRAIN NATRIURETIC PEPTIDE: B NATRIURETIC PEPTIDE 5: 282.1 pg/mL — AB (ref 0.0–100.0)

## 2014-12-26 LAB — TROPONIN I: Troponin I: 0.07 ng/mL — ABNORMAL HIGH (ref <0.031)

## 2014-12-26 LAB — PROTIME-INR
INR: 1.23 (ref 0.00–1.49)
Prothrombin Time: 15.7 seconds — ABNORMAL HIGH (ref 11.6–15.2)

## 2014-12-26 MED ORDER — SODIUM CHLORIDE 0.9 % IV BOLUS (SEPSIS)
250.0000 mL | Freq: Once | INTRAVENOUS | Status: AC
Start: 2014-12-26 — End: 2014-12-26
  Administered 2014-12-26: 250 mL via INTRAVENOUS

## 2014-12-26 MED ORDER — SODIUM CHLORIDE 0.9 % IV BOLUS (SEPSIS)
500.0000 mL | Freq: Once | INTRAVENOUS | Status: AC
  Administered 2014-12-26: 500 mL via INTRAVENOUS

## 2014-12-26 NOTE — ED Notes (Signed)
EKG given to DR. Palumbo, paper work placed at bedside with the patient.

## 2014-12-26 NOTE — ED Notes (Signed)
ambulatedin the hall 25 feet, did very well

## 2014-12-26 NOTE — ED Notes (Addendum)
Ultrasound called to see if pt needs to have portable ultrasound. I spoke with EDP who is comfortable with pt being transported at this time since pt's blood pressure has improved and has been seen by cardiology.

## 2014-12-26 NOTE — Consult Note (Signed)
Advanced Heart Failure Team Consult Note   Primary Cardiologist:  [place]  Reason for Consultation: CP, nausea  HPI:    [name] is a 81 y.o. female with the history of mild diffuse nonobstructive CAD by cardiac catheterization in 01/2014, persistent atrial fibrillation, diastolic CHF, HTN, HL, anemia, and CKD. Patient was admitted 04/2014 x2 with acute on chronic diastolic CHF. She underwent cardioversion with restoration of NSR. This resulted in profound bradycardia and hypotension requiring pacemaker implantation. Pacemaker implant was complicated by acute blood loss anemia requiring transfusion with PRBCs  Admitted to [facility] August 21 with increased dyspnea. Diuresed with IV lasix and transitioned to po lasix 40 mg twice a day. GI evaluated due to anemia and black stool. Had EGD with no acute findings. Colonoscopy was cancelled per daughter due to multiple medical problems. She continued on eliquis 2.5 mg twice a day. Palliative Care consulted and the patient and family request aggressive care for heart failure. Discharge weight was 152 pounds.   Has had had several admits for HF since. Most recently admitted 12/06/14 - 12/09/14 for ADHF. Initial weight 134 pounds. Diuresed down to 130 and felt better. Treatment limited due to hypotension and worsening renal failure with creatinine up to 2.6. Renal u/s ok. Metolazone stopped.  I saw her in clinic on 12/16/14 and was doing well. Subsequently weight trended up and she took a metolazone tablet and lost 7 pounds and felt dizzy  Woke up at 3am with nausea and then developed CP. EMS called and she  zofran and 3 NTG. In ER was hypotensive and got 500cc NS.  Trop 0.07. ECG NSR with v-pacing. Now feels better. No ab pain, diarrhea. Has black stools but is on iron. BNP and renal function at baseline. Weight 133   Studies:  - LHC (3/15): Mid LM 20-30%, mid RCA 30-40%, EF 65%  - Echo (4/15): Mild LVH, EF 60-65%, no RWMA, mild BAE, trivial eff  -  Carotid US (5/15): 40-59% RICA stenosis. 9-16% LICA stenosis.   Review of Systems: [y] = yes, [ ]  = no   General: Weight gain [ ] ; Weight loss [ ] ; Anorexia [ ] ; Fatigue [ ] ; Fever [ ] ; Chills [ ] ; Weakness [ y]  Cardiac: Chest pain/pressure Blue.Reese ]; Resting SOB Blue.Reese ]; Exertional SOB Blue.Reese ]; Orthopnea [ ] ; Pedal Edema [ y]; Palpitations [ ] ; Syncope [ ] ; Presyncope [ ] ; Paroxysmal nocturnal dyspnea[ ]   Pulmonary: Cough [ ] ; Wheezing[ ] ; Hemoptysis[ ] ; Sputum [ ] ; Snoring [ ]   GI: Vomiting[ ] ; Dysphagia[ ] ; Melena[ ] ; Hematochezia [ ] ; Heartburn[ ] ; Abdominal pain [ ] ; Constipation [ ] ; Diarrhea [ ] ; BRBPR [ ]   GU: Hematuria[ ] ; Dysuria [ ] ; Nocturia[ ]   Vascular: Pain in legs with walking [ ] ; Pain in feet with lying flat [ ] ; Non-healing sores [ ] ; Stroke [ ] ; TIA [ ] ; Slurred speech [ ] ;  Neuro: Headaches[ ] ; Vertigo[ ] ; Seizures[ ] ; Paresthesias[ ] ;Blurred vision [ ] ; Diplopia [ ] ; Vision changes [ ]   Ortho/Skin: Arthritis Blue.Reese ]; Joint pain Blue.Reese ]; Muscle pain [ ] ; Joint swelling [ ] ; Back Pain [ ] ; Rash [ ]   Psych: Depression[ ] ; Anxiety[ ]   Heme: Bleeding problems [ ] ; Clotting disorders [ ] ; Anemia [ ]   Endocrine: Diabetes [ ] ; Thyroid dysfunction[ ]   Home Medications Prior to Admission medications   Medication Sig Start Date End Date Taking? Authorizing Provider  acetaminophen (TYLENOL) 325 MG tablet Take 2 tablets (650 mg total) by  mouth every 4 (four) hours as needed for headache or mild pain. 09/26/14   Isaiah Serge, NP  amiodarone (PACERONE) 200 MG tablet Take 1 tablet (200 mg total) by mouth daily. 11/06/14   Jolaine Artist, MD  apixaban (ELIQUIS) 2.5 MG TABS tablet Take 1 tablet (2.5 mg total) by mouth 2 (two) times daily. 11/21/14   Burnis Medin, MD  ferrous sulfate 325 (65 FE) MG tablet Take 1 tablet (325 mg total) by mouth 2 (two) times daily with a meal. 11/21/14   Burnis Medin, MD  levothyroxine (SYNTHROID, LEVOTHROID) 75 MCG tablet Take 1 tablet (75 mcg total) by mouth daily.  11/21/14   Burnis Medin, MD  loratadine (CLARITIN) 10 MG tablet Take 10 mg by mouth daily as needed for allergies.     Historical Provider, MD  LORazepam (ATIVAN) 0.5 MG tablet Take 0.5-1 tablets (0.25-0.5 mg total) by mouth every 8 (eight) hours as needed for anxiety. 09/09/14   Burnis Medin, MD  metolazone (ZAROXOLYN) 2.5 MG tablet Take 1 tablet (2.5 mg total) by mouth as needed (for weight 136 lb or greater). 12/16/14   Jolaine Artist, MD  metoprolol tartrate (LOPRESSOR) 25 MG tablet Take 25 mg by mouth 2 (two) times daily.  11/20/14   Historical Provider, MD  nitroGLYCERIN (NITROSTAT) 0.4 MG SL tablet Place 1 tablet (0.4 mg total) under the tongue every 5 (five) minutes as needed. For chest pain 12/02/13   Fay Records, MD  pantoprazole (PROTONIX) 40 MG tablet Take 1 tablet (40 mg total) by mouth daily. 08/11/14   Burnis Medin, MD  potassium chloride SA (KLOR-CON M20) 20 MEQ tablet Take 2 tablets (40 mEq total) by mouth 2 (two) times daily. 10/30/14   Almyra Deforest, PA  senna (SENOKOT) 8.6 MG TABS tablet Take 2 tablets (17.2 mg total) by mouth daily as needed for mild constipation. 09/26/14   Isaiah Serge, NP  simvastatin (ZOCOR) 10 MG tablet Take 1 tablet (10 mg total) by mouth at bedtime. 11/06/14   Jolaine Artist, MD  torsemide (DEMADEX) 20 MG tablet Take 2 tablets (40 mg total) by mouth 2 (two) times daily. 12/10/14   Brittainy Erie Noe, PA-C    Past Medical History: Past Medical History  Diagnosis Date  . Dyslipidemia   . Hypertension   . Palpitations     PVCs/bigeminy on event in May 2009 revealing this was relatively asymptomatic  . Chest pain     a. 2011 Neg MV;  b. 01/2014 Cath: LM 20-30, LAD nl, D1 nl, LCX nl, OM1 nl, RCA dom 30-92m, PD/PL nl, EF 65%->Med Rx.  . Degenerative joint disease   . Hx of varicella   . History of skin cancer     tafeen/ non melanoma.   . Thrombocytopenia   . Anemia   . Monoclonal gammopathies   . Right lower quadrant abdominal pain 07/19/2012     Recurrent  With nausea    This is new since she had ct for llq pain in [facility]    R/o appendiceal problem  Hernia  Get ct scan  And plan  Fu     . Diastolic CHF, acute on chronic 01/31/2012  . Atrial fibrillation     a. Dx 01/2014->Eliquis started.  . Cancer   . Pacemaker   . Dysrhythmia     Past Surgical History: Past Surgical History  Procedure Laterality Date  . Cholecystectomy  1999  . Cesarean section  times 2  . Shoulder surgery  1996    Right   . Cataract extraction      Bilateral  implantt  . Cardioversion N/A 02/26/2014    Procedure: CARDIOVERSION AT BEDSIDE;  Surgeon: Pixie Casino, MD;  Location: [facility];  Service: Cardiovascular;  Laterality: N/A;  . Cardioversion N/A 04/22/2014    Procedure: CARDIOVERSION (BEDSIDE);  Surgeon: [name], MD;  Location: [facility] OR;  Service: Cardiovascular;  Laterality: N/A;  . Pacemaker insertion  04/23/2014    STJ Assurity dual chamber pacemaker implanted by Dr Rayann Heman  . Insert / replace / remove pacemaker    . Esophagogastroduodenoscopy N/A 07/06/2014    Procedure: ESOPHAGOGASTRODUODENOSCOPY (EGD);  Surgeon: Ladene Artist, MD;  Location[facility] ENDOSCOPY;  Service: Endoscopy;  Laterality: N/A;  . Left heart catheterization with coronary angiogram N/A 01/29/2014    Procedure: LEFT HEART CATHETERIZATION WITH CORONARY ANGIOGRAM;  Surgeon: Blane Ohara, MD;  Location[facility] C[facility] LAB;  Service: Cardiovascular;  Laterality: N/A;  . Permanent pacemaker insertion N/A 04/23/2014    Procedure: PERMANENT PACEMAKER INSERTION;  Surgeon: Evans Lance, MD;  Location[facility] C[facility] LAB;  Service: Cardiovascular;  Laterality: N/A;  . Right heart catheterization N/A 09/22/2014    Procedure: RIGHT HEART CATH;  Surgeon: Jolaine Artist, MD;  Location: [facility] CATH LAB;  Service: Cardiovascular;  Laterality: N/A;    Family History: Family History  Problem Relation Age of Onset  . Coronary artery disease Father   . Heart disease Father   . Lung cancer    .  Alzheimer's disease Mother   . Cancer Brother 20    lung cancer  . Heart attack Father     Social History: History   Social History  . Marital Status: Widowed    Spouse Name: N/A  . Number of Children: 3  . Years of Education: N/A   Occupational History  .      [employer], book keeping   Social History Main Topics  . Smoking status: Never Smoker   . Smokeless tobacco: Never Used  . Alcohol Use: No  . Drug Use: No  . Sexual Activity: No   Other Topics Concern  . 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 of 1  Has 2 labs    Neg tad [place]    G3P3      Daughter and gets some of her food and cooks at her house . Eats with her.      Daughter handles medications. Sees HH and PT once a week.    Allergies:  Allergies  Allergen Reactions  . Tramadol Nausea Only    Objective:    Vital Signs:   Temp:  [98.2 F (36.8 C)] 98.2 F (36.8 C) (02/12 0534) Pulse Rate:  [51-69] 59 (02/12 0933) Resp:  [12-19] 18 (02/12 0933) BP: (79-101)/(32-56) 101/48 mmHg (02/12 0933) SpO2:  [94 %-100 %] 97 % (02/12 0933) Weight:  [60.328 kg (133 lb)] 60.328 kg (133 lb) (02/12 0534)    Weight change: Filed Weights   12/26/14 0534  Weight: 60.328 kg (133 lb)    Intake/Output:  No intake or output data in the 24 hours ending 12/26/14 0936   PHYSICAL EXAM: General: Elderly Frail appearing. No resp difficulty. Daughter present HEENT: normal except R cheek bandaide noted.  Neck: supple. JVP 6 . Carotids 2+  bilaterally; no bruits. No lymphadenopathy or thryomegaly appreciated. Cor: PMI normal. Regular rate & rhythm. No rubs, gallops or murmurs. Lungs: clear Abdomen: soft, nontender, nondistended. No hepatosplenomegaly. No bruits or masses. Good bowel sounds. Extremities: no cyanosis, clubbing, rash, R and LLE severe varicose veins trace edema Neuro: alert &  orientedx3, cranial nerves grossly intact. Moves all 4 extremities w/o difficulty. Affect pleasant.  Telemetry: sinus with vpacing 60  Labs: Basic Metabolic Panel:  Recent Labs Lab 12/26/14 0554  NA 139  K 3.9  CL 100  CO2 29  GLUCOSE 116*  BUN 40*  CREATININE 2.53*  CALCIUM 9.3    Liver Function Tests: No results for input(s): AST, ALT, ALKPHOS, BILITOT, PROT, ALBUMIN in the last 168 hours. No results for input(s): LIPASE, AMYLASE in the last 168 hours. No results for input(s): AMMONIA in the last 168 hours.  CBC:  Recent Labs Lab 12/26/14 0554  WBC 5.4  HGB 12.2  HCT 37.8  MCV 98.2  PLT 129*    Cardiac Enzymes:  Recent Labs Lab 12/26/14 0554  TROPONINI 0.07*    BNP: BNP (last 3 results)  Recent Labs  12/06/14 0157 12/26/14 0554  BNP 947.6* 282.1*    ProBNP (last 3 results)  Recent Labs  08/27/14 1147 09/18/14 1317 10/25/14 0955  PROBNP 914.0* 1874.0* 13191.0*     CBG: No results for input(s): GLUCAP in the last 168 hours.  Coagulation Studies:  Recent Labs  12/26/14 0554  LABPROT 15.7*  INR 1.23    Other results: EKG: NSR with v-pacing 60  Imaging: Dg Chest 1 View  12/26/2014   CLINICAL DATA:  Acute onset of generalized chest pain. Initial encounter.  EXAM: CHEST  1 VIEW  COMPARISON:  Chest radiograph performed 12/06/2014  FINDINGS: The lungs are well-aerated. Previously noted vascular congestion has improved. Minimal bibasilar atelectasis is noted. No definite pleural effusion or pneumothorax is seen.  The cardiomediastinal silhouette is borderline enlarged. A pacemaker is noted overlying the right chest wall, with leads ending overlying the right atrium and right ventricle. No acute osseous abnormalities are seen.  IMPRESSION: Borderline cardiomegaly; minimal bibasilar atelectasis noted. No displaced rib fracture seen.   Electronically Signed   By: Garald Balding M.D.   On: 12/26/2014 06:15      Medications:     Current  Medications:     Infusions: . sodium chloride 250 mL (12/26/14 0846)      Assessment:   1. Chest pain  --very mild nonobstructive CAD on cath 3/15 2. Chronic diastolic HF 3. PAF on eliquis 4. CKD, stage IV  5. Nausea 6. Hypotension in setting of NTG - improved with IVF  Plan/Discussion:    Given results of recent cath suspect CP is nonischemic. Nausea improved with zofran - ab exam benign.  HF and renal function are stable.   I have reassured her and feel she can go home with f/u in [facility] as scheduled on Tuesday. Given big drop in weight with metolazone I told her to take 1/2 tablet when needed for fluid overload.   Length of Stay:   Glori Bickers MD 12/26/2014, 9:36 AM  Advanced Heart Failure Team Pager [phone] (M-F; [place])  Please contact [facility] for night-coverage after hours (4p -7a ) and weekends on [email or web address]

## 2014-12-26 NOTE — ED Notes (Signed)
MD at bedside. 

## 2014-12-26 NOTE — ED Notes (Addendum)
EMS - Patient coming from home with c/o of central and right chest pain with onset at 03:30am.  Patient given 324mg  Aspirin, 3 Nitro and 4mg  Zofran.  Patient was c/o of associated nausea that is now relieved.  Patient has a Investment banker, corporate.  Patient is rating the pain a 1/10 at this time.

## 2014-12-26 NOTE — ED Notes (Signed)
Cardiology at bedside.

## 2014-12-26 NOTE — ED Notes (Signed)
Dr. Randal Buba made aware of patients continuing low B/P.  No new orders placed at this time.

## 2014-12-26 NOTE — Discharge Instructions (Signed)
Chest Pain (Nonspecific) Follow up with Dr. Haroldine Laws. Return to the ED if you develop worsening chest pain, shortness of breath, or other concerns. It is often hard to give a specific diagnosis for the cause of chest pain. There is always a chance that your pain could be related to something serious, such as a heart attack or a blood clot in the lungs. You need to follow up with your health care provider for further evaluation. CAUSES   Heartburn.  Pneumonia or bronchitis.  Anxiety or stress.  Inflammation around your heart (pericarditis) or lung (pleuritis or pleurisy).  A blood clot in the lung.  A collapsed lung (pneumothorax). It can develop suddenly on its own (spontaneous pneumothorax) or from trauma to the chest.  Shingles infection (herpes zoster virus). The chest wall is composed of bones, muscles, and cartilage. Any of these can be the source of the pain.  The bones can be bruised by injury.  The muscles or cartilage can be strained by coughing or overwork.  The cartilage can be affected by inflammation and become sore (costochondritis). DIAGNOSIS  Lab tests or other studies may be needed to find the cause of your pain. Your health care provider may have you take a test called an ambulatory electrocardiogram (ECG). An ECG records your heartbeat patterns over a 24-hour period. You may also have other tests, such as:  Transthoracic echocardiogram (TTE). During echocardiography, sound waves are used to evaluate how blood flows through your heart.  Transesophageal echocardiogram (TEE).  Cardiac monitoring. This allows your health care provider to monitor your heart rate and rhythm in real time.  Holter monitor. This is a portable device that records your heartbeat and can help diagnose heart arrhythmias. It allows your health care provider to track your heart activity for several days, if needed.  Stress tests by exercise or by giving medicine that makes the heart beat  faster. TREATMENT   Treatment depends on what may be causing your chest pain. Treatment may include:  Acid blockers for heartburn.  Anti-inflammatory medicine.  Pain medicine for inflammatory conditions.  Antibiotics if an infection is present.  You may be advised to change lifestyle habits. This includes stopping smoking and avoiding alcohol, caffeine, and chocolate.  You may be advised to keep your head raised (elevated) when sleeping. This reduces the chance of acid going backward from your stomach into your esophagus. Most of the time, nonspecific chest pain will improve within 2-3 days with rest and mild pain medicine.  HOME CARE INSTRUCTIONS   If antibiotics were prescribed, take them as directed. Finish them even if you start to feel better.  For the next few days, avoid physical activities that bring on chest pain. Continue physical activities as directed.  Do not use any tobacco products, including cigarettes, chewing tobacco, or electronic cigarettes.  Avoid drinking alcohol.  Only take medicine as directed by your health care provider.  Follow your health care provider's suggestions for further testing if your chest pain does not go away.  Keep any follow-up appointments you made. If you do not go to an appointment, you could develop lasting (chronic) problems with pain. If there is any problem keeping an appointment, call to reschedule. SEEK MEDICAL CARE IF:   Your chest pain does not go away, even after treatment.  You have a rash with blisters on your chest.  You have a fever. SEEK IMMEDIATE MEDICAL CARE IF:   You have increased chest pain or pain that spreads to your  arm, neck, jaw, back, or abdomen.  You have shortness of breath.  You have an increasing cough, or you cough up blood.  You have severe back or abdominal pain.  You feel nauseous or vomit.  You have severe weakness.  You faint.  You have chills. This is an emergency. Do not wait to  see if the pain will go away. Get medical help at once. Call your local emergency services (911 in [place] Do not drive yourself to the hospital. MAKE SURE YOU:   Understand these instructions.  Will watch your condition.  Will get help right away if you are not doing well or get worse. Document Released: 08/10/2005 Document Revised: 11/05/2013 Document Reviewed: 06/05/2008 [facility] Patient Information 2015 [place], [place]. This information is not intended to replace advice given to you by your health care provider. Make sure you discuss any questions you have with your health care provider.

## 2014-12-26 NOTE — ED Provider Notes (Signed)
CSN: [id number]     Arrival date & time 12/26/14  3716 History   First MD Initiated Contact with Patient 12/26/14 [phone]     Chief Complaint  Patient presents with  . Chest Pain     (Consider location/radiation/quality/duration/timing/severity/associated sxs/prior Treatment) HPI Comments: From home with episode of nausea that woke her from sleep around 3 AM. It was associated with central chest pain it radiates to her right side. It is intermittent. Lasting several minutes at a time it is now resolved. She received aspirin and nitroglycerin by EMS. Patient states she has never had this kind of pain before. Patient has a history of medically managed CAD, CHF with AICD in place. States her breathing is at baseline. Denies any vomiting. Denies any dizziness or lightheadedness. Has some chronic low back pain at baseline. Denies any pain or swelling in her legs. She states compliance with her medications.  Patient is a 81 y.o. female presenting with chest pain. The history is provided by the EMS personnel and the patient.  Chest Pain Associated symptoms: fatigue and shortness of breath   Associated symptoms: no abdominal pain, no dizziness, no headache, no nausea, not vomiting and no weakness     Past Medical History  Diagnosis Date  . Dyslipidemia   . Hypertension   . Palpitations     PVCs/bigeminy on event in May 2009 revealing this was relatively asymptomatic  . Chest pain     a. 2011 Neg MV;  b. 01/2014 Cath: LM 20-30, LAD nl, D1 nl, LCX nl, OM1 nl, RCA dom 30-57m, PD/PL nl, EF 65%->Med Rx.  . Degenerative joint disease   . Hx of varicella   . History of skin cancer     tafeen/ non melanoma.   . Thrombocytopenia   . Anemia   . Monoclonal gammopathies   . Right lower quadrant abdominal pain 07/19/2012    Recurrent  With nausea    This is new since she had ct for llq pain in [facility]    R/o appendiceal problem  Hernia  Get ct scan  And plan  Fu     . Diastolic CHF, acute on chronic 01/31/2012   . Atrial fibrillation     a. Dx 01/2014->Eliquis started.  . Cancer   . Pacemaker   . Dysrhythmia    Past Surgical History  Procedure Laterality Date  . Cholecystectomy  1999  . Cesarean section      times 2  . Shoulder surgery  1996    Right   . Cataract extraction      Bilateral  implantt  . Cardioversion N/A 02/26/2014    Procedure: CARDIOVERSION AT BEDSIDE;  Surgeon: Pixie Casino, MD;  Location: [facility];  Service: Cardiovascular;  Laterality: N/A;  . Cardioversion N/A 04/22/2014    Procedure: CARDIOVERSION (BEDSIDE);  Surgeon: [name], MD;  Location: [facility] OR;  Service: Cardiovascular;  Laterality: N/A;  . Pacemaker insertion  04/23/2014    STJ Assurity dual chamber pacemaker implanted by Dr Rayann Heman  . Insert / replace / remove pacemaker    . Esophagogastroduodenoscopy N/A 07/06/2014    Procedure: ESOPHAGOGASTRODUODENOSCOPY (EGD);  Surgeon: Ladene Artist, MD;  Location[facility] ENDOSCOPY;  Service: Endoscopy;  Laterality: N/A;  . Left heart catheterization with coronary angiogram N/A 01/29/2014    Procedure: LEFT HEART CATHETERIZATION WITH CORONARY ANGIOGRAM;  Surgeon: Blane Ohara, MD;  Location: [facility] CATH LAB;  Service: Cardiovascular;  Laterality: N/A;  . Permanent pacemaker insertion N/A 04/23/2014  Procedure: PERMANENT PACEMAKER INSERTION;  Surgeon: Evans Lance, MD;  Location: [facility] CATH LAB;  Service: Cardiovascular;  Laterality: N/A;  . Right heart catheterization N/A 09/22/2014    Procedure: RIGHT HEART CATH;  Surgeon: Jolaine Artist, MD;  Location: [facility] CATH LAB;  Service: Cardiovascular;  Laterality: N/A;   Family History  Problem Relation Age of Onset  . Coronary artery disease Father   . Heart disease Father   . Lung cancer    . Alzheimer's disease Mother   . Cancer Brother 60    lung cancer  . Heart attack Father    History  Substance Use Topics  . Smoking status: Never Smoker   . Smokeless tobacco: Never Used  . Alcohol Use: No   OB History    No  data available     Review of Systems  Constitutional: Positive for fatigue. Negative for activity change.  HENT: Negative for congestion and rhinorrhea.   Eyes: Negative for visual disturbance.  Respiratory: Positive for chest tightness and shortness of breath.   Cardiovascular: Positive for chest pain. Negative for leg swelling.  Gastrointestinal: Negative for nausea, vomiting and abdominal pain.  Genitourinary: Negative for dysuria and hematuria.  Musculoskeletal: Negative for myalgias and arthralgias.  Skin: Negative for rash.  Neurological: Negative for dizziness, weakness and headaches.  A complete 10 system review of systems was obtained and all systems are negative except as noted in the HPI and PMH.      Allergies  Tramadol  Home Medications   Prior to Admission medications   Medication Sig Start Date End Date Taking? Authorizing Provider  acetaminophen (TYLENOL) 325 MG tablet Take 2 tablets (650 mg total) by mouth every 4 (four) hours as needed for headache or mild pain. 09/26/14   Isaiah Serge, NP  amiodarone (PACERONE) 200 MG tablet Take 1 tablet (200 mg total) by mouth daily. 11/06/14   Jolaine Artist, MD  apixaban (ELIQUIS) 2.5 MG TABS tablet Take 1 tablet (2.5 mg total) by mouth 2 (two) times daily. 11/21/14   Burnis Medin, MD  ferrous sulfate 325 (65 FE) MG tablet Take 1 tablet (325 mg total) by mouth 2 (two) times daily with a meal. 11/21/14   Burnis Medin, MD  levothyroxine (SYNTHROID, LEVOTHROID) 75 MCG tablet Take 1 tablet (75 mcg total) by mouth daily. 11/21/14   Burnis Medin, MD  loratadine (CLARITIN) 10 MG tablet Take 10 mg by mouth daily as needed for allergies.     Historical Provider, MD  LORazepam (ATIVAN) 0.5 MG tablet Take 0.5-1 tablets (0.25-0.5 mg total) by mouth every 8 (eight) hours as needed for anxiety. 09/09/14   Burnis Medin, MD  metolazone (ZAROXOLYN) 2.5 MG tablet Take 1 tablet (2.5 mg total) by mouth as needed (for weight 136 lb or  greater). 12/16/14   Jolaine Artist, MD  metoprolol tartrate (LOPRESSOR) 25 MG tablet Take 25 mg by mouth 2 (two) times daily.  11/20/14   Historical Provider, MD  nitroGLYCERIN (NITROSTAT) 0.4 MG SL tablet Place 1 tablet (0.4 mg total) under the tongue every 5 (five) minutes as needed. For chest pain 12/02/13   Fay Records, MD  pantoprazole (PROTONIX) 40 MG tablet Take 1 tablet (40 mg total) by mouth daily. 08/11/14   Burnis Medin, MD  potassium chloride SA (KLOR-CON M20) 20 MEQ tablet Take 2 tablets (40 mEq total) by mouth 2 (two) times daily. 10/30/14   Almyra Deforest, PA  senna (SENOKOT) 8.6  MG TABS tablet Take 2 tablets (17.2 mg total) by mouth daily as needed for mild constipation. 09/26/14   Isaiah Serge, NP  simvastatin (ZOCOR) 10 MG tablet Take 1 tablet (10 mg total) by mouth at bedtime. 11/06/14   Jolaine Artist, MD  torsemide (DEMADEX) 20 MG tablet Take 2 tablets (40 mg total) by mouth 2 (two) times daily. 12/10/14   Brittainy M Simmons, PA-C   BP 99/42 mmHg  Pulse 59  Temp(Src) 97.9 F (36.6 C) (Oral)  Resp 15  Ht 4\' 10"  (1.473 m)  Wt 133 lb (60.328 kg)  BMI 27.80 kg/m2  SpO2 98% Physical Exam  Constitutional: She is oriented to person, place, and time. She appears well-developed and well-nourished. No distress.  HENT:  Head: Normocephalic and atraumatic.  Mouth/Throat: Oropharynx is clear and moist. No oropharyngeal exudate.  Eyes: Conjunctivae and EOM are normal. Pupils are equal, round, and reactive to light.  Neck: Normal range of motion. Neck supple.  No meningismus.  Cardiovascular: Normal rate, regular rhythm, normal heart sounds and intact distal pulses.   No murmur heard. Pulmonary/Chest: Effort normal and breath sounds normal. No respiratory distress.  AICD R chest  Abdominal: Soft. There is no tenderness. There is no rebound and no guarding.  Musculoskeletal: Normal range of motion. She exhibits no edema or tenderness.  Neurological: She is alert and oriented to  person, place, and time. No cranial nerve deficit. She exhibits normal muscle tone. Coordination normal.  No ataxia on finger to nose bilaterally. No pronator drift. 5/5 strength throughout. CN 2-12 intact. Negative Romberg. Equal grip strength. Sensation intact. Gait is normal.   Skin: Skin is warm.  Psychiatric: She has a normal mood and affect. Her behavior is normal.  Nursing note and vitals reviewed.   ED Course  Procedures (including critical care time) Labs Review Labs Reviewed  CBC - Abnormal; Notable for the following:    RBC 3.85 (*)    Platelets 129 (*)    All other components within normal limits  BASIC METABOLIC PANEL - Abnormal; Notable for the following:    Glucose, Bld 116 (*)    BUN 40 (*)    Creatinine, Ser 2.53 (*)    GFR calc non Af Amer 16 (*)    GFR calc Af Amer 19 (*)    All other components within normal limits  PROTIME-INR - Abnormal; Notable for the following:    Prothrombin Time 15.7 (*)    All other components within normal limits  BRAIN NATRIURETIC PEPTIDE - Abnormal; Notable for the following:    B Natriuretic Peptide 282.1 (*)    All other components within normal limits  TROPONIN I - Abnormal; Notable for the following:    Troponin I 0.07 (*)    All other components within normal limits  I-STAT TROPOININ, ED - Abnormal; Notable for the following:    Troponin i, poc 0.11 (*)    All other components within normal limits  I-STAT CHEM 8, ED - Abnormal; Notable for the following:    BUN 39 (*)    Creatinine, Ser 2.30 (*)    Glucose, Bld 105 (*)    Calcium, Ion 1.09 (*)    All other components within normal limits  I-STAT CHEM 8, ED - Abnormal; Notable for the following:    BUN 40 (*)    Creatinine, Ser 2.30 (*)    Calcium, Ion 1.12 (*)    All other components within normal limits  Randolm Idol, ED  I-STAT CG4 LACTIC ACID, ED  I-STAT CG4 LACTIC ACID, ED    Imaging Review Dg Chest 1 View  12/26/2014   CLINICAL DATA:  Acute onset of  generalized chest pain. Initial encounter.  EXAM: CHEST  1 VIEW  COMPARISON:  Chest radiograph performed 12/06/2014  FINDINGS: The lungs are well-aerated. Previously noted vascular congestion has improved. Minimal bibasilar atelectasis is noted. No definite pleural effusion or pneumothorax is seen.  The cardiomediastinal silhouette is borderline enlarged. A pacemaker is noted overlying the right chest wall, with leads ending overlying the right atrium and right ventricle. No acute osseous abnormalities are seen.  IMPRESSION: Borderline cardiomegaly; minimal bibasilar atelectasis noted. No displaced rib fracture seen.   Electronically Signed   By: Garald Balding M.D.   On: 12/26/2014 06:15   US Aorta  12/26/2014   CLINICAL DATA:  81 year old with chest pain.  EXAM: ULTRASOUND OF ABDOMINAL AORTA  TECHNIQUE: Ultrasound examination of the abdominal aorta was performed to evaluate for abdominal aortic aneurysm.  COMPARISON:  Abdominal CT 07/20/2012  FINDINGS: Abdominal Aorta  No aneurysm identified. Mild wall irregularity is consistent with atherosclerotic disease in the aorta. Iliac arteries are not enlarged.  Maximum AP  Diameter:  2.3 cm  Maximum TRV  Diameter: 2.2 cm  IMPRESSION: Negative for an abdominal aortic aneurysm.   Electronically Signed   By: Markus Daft M.D.   On: 12/26/2014 11:04     EKG Interpretation None      MDM   Final diagnoses:  Atypical chest pain  Diastolic congestive heart failure, unspecified congestive heart failure chronicity   past medical history of chronic diastolic congestive heart failure, atrial fibrillation currently AV paced, frequent admissions for CHF exacerbation Patient hypotensive in 80s and 90s. She received 3 sublingual nitroglycerin. Her blood pressure during previous visits has been in the 90-100 range. Ultrasound shows no evidence of pericardial effusion or tamponade. EF is 60% on last echocardiogram. She'll be given small fluid bolus.  On eliquis, doubt  PE.    Blood pressure has improved to 90 systolic. Patient denies any dizziness or lightheadedness. She's been seen by cardiology Dr. Haroldine Laws. He feels she is at her baseline stable for discharge. She had a recent reassuring catheterization. He feels her slight troponin elevation is likely due to her CHF and CKD.   No evidence of AAA. Creatinine is at baseline. Lactate is normal. Patient is ambulatory at her baseline per daughter. She feels well and is not dizzy or lightheaded. No further chest pain or shortness of breath. Blood pressure 103/41. Previous discharge summary says patient with chronic low blood pressure in the 90 range. Cardiology aware.  Dr. Haroldine Laws feels patient is at her baseline and stable for discharge. She declines observation admission feels like she can go home. Return precautions discussed.     Date: 12/26/2014  Rate: 60  Rhythm: normal sinus rhythm  QRS Axis: normal  Intervals: normal  ST/T Wave abnormalities: nonspecific ST/T changes  Conduction Disutrbances:nonspecific intraventricular conduction delay  Narrative Interpretation:   Old EKG Reviewed: unchanged     EMERGENCY DEPARTMENT [place] CARDIAC EXAM "Study: Limited Ultrasound of the heart and pericardium"  INDICATIONS:Hypotension Multiple views of the heart and pericardium are obtained with a multi-frequency probe.  PERFORMED OV:FIEPPI  IMAGES ARCHIVED?: Yes  FINDINGS: No pericardial effusion, Normal contractility and Tamponade physiology absent  LIMITATIONS:  Body habitus  VIEWS USED: Subcostal 4 chamber and Parasternal long axis  INTERPRETATION: Cardiac activity present, Pericardial effusioin absent and Cardiac tamponade absent  COMMENT:     Ezequiel Essex, MD 12/26/14 [phone]

## 2014-12-28 ENCOUNTER — Other Ambulatory Visit: Payer: Self-pay | Admitting: Internal Medicine

## 2014-12-28 ENCOUNTER — Encounter: Payer: Self-pay | Admitting: Internal Medicine

## 2014-12-29 ENCOUNTER — Telehealth: Payer: Self-pay

## 2014-12-29 MED ORDER — APIXABAN 2.5 MG PO TABS: 2.5000 mg | ORAL_TABLET | Freq: Two times a day (BID) | ORAL | Status: DC

## 2014-12-29 NOTE — Telephone Encounter (Signed)
This has been completed.

## 2014-12-29 NOTE — Telephone Encounter (Signed)
[place] refill request ELIQUIS 2.5MG . TAKE ONE TAB BID #60

## 2014-12-30 ENCOUNTER — Encounter (HOSPITAL_COMMUNITY): Payer: Medicare Other

## 2014-12-30 ENCOUNTER — Ambulatory Visit (HOSPITAL_COMMUNITY)
Admission: RE | Admit: 2014-12-30 | Discharge: 2014-12-30 | Disposition: A | Discharge status: Home / Self Care | Payer: Medicare Other | Source: Ambulatory Visit | Attending: Internal Medicine | Admitting: Internal Medicine

## 2014-12-30 VITALS — BP 116/56 | HR 63 | Wt 139.2 lb

## 2014-12-30 DIAGNOSIS — I129 Hypertensive chronic kidney disease with stage 1 through stage 4 chronic kidney disease, or unspecified chronic kidney disease: Secondary | ICD-10-CM | POA: Diagnosis not present

## 2014-12-30 DIAGNOSIS — Z7901 Long term (current) use of anticoagulants: Secondary | ICD-10-CM | POA: Insufficient documentation

## 2014-12-30 DIAGNOSIS — I5032 Chronic diastolic (congestive) heart failure: Secondary | ICD-10-CM | POA: Insufficient documentation

## 2014-12-30 DIAGNOSIS — N183 Chronic kidney disease, stage 3 (moderate): Secondary | ICD-10-CM | POA: Diagnosis not present

## 2014-12-30 DIAGNOSIS — Z79899 Other long term (current) drug therapy: Secondary | ICD-10-CM | POA: Insufficient documentation

## 2014-12-30 DIAGNOSIS — I482 Chronic atrial fibrillation: Secondary | ICD-10-CM | POA: Insufficient documentation

## 2014-12-30 DIAGNOSIS — Z950 Presence of cardiac pacemaker: Secondary | ICD-10-CM | POA: Diagnosis not present

## 2014-12-30 DIAGNOSIS — I2510 Atherosclerotic heart disease of native coronary artery without angina pectoris: Secondary | ICD-10-CM | POA: Diagnosis not present

## 2014-12-30 DIAGNOSIS — E785 Hyperlipidemia, unspecified: Secondary | ICD-10-CM | POA: Insufficient documentation

## 2014-12-30 DIAGNOSIS — I480 Paroxysmal atrial fibrillation: Secondary | ICD-10-CM | POA: Insufficient documentation

## 2014-12-30 MED ORDER — APIXABAN 2.5 MG PO TABS: 2.5000 mg | ORAL_TABLET | Freq: Two times a day (BID) | ORAL | Status: DC

## 2014-12-30 NOTE — Patient Instructions (Signed)
Take Metolazone 1/2 tab as needed  Your physician recommends that you schedule a follow-up appointment in: 1 month

## 2014-12-30 NOTE — Progress Notes (Signed)
Patient ID: [name], female   DOB: 1925/01/22, 81 y.o.   MRN: [id number] PCP: Dr Netty Starring Primary Cardiologist: Dr. Harrington Challenger  HPI: [name] is a 81 y.o. female with the history of nonobstructive CAD by cardiac catheterization in 01/2014, persistent atrial fibrillation, diastolic CHF, HTN, HL, anemia, and CKD. Patient was admitted 04/2014 x2 with acute on chronic diastolic CHF. She underwent cardioversion with restoration of NSR. This resulted in profound bradycardia and hypotension requiring pacemaker implantation. Pacemaker implant was complicated by acute blood loss anemia requiring transfusion with PRBCs  Admitted to [facility] 21 with increased dyspnea. Diuresed with IV lasix and transitioned to po lasix 40 mg twice a day. GI evaluated due to anemia and black stool. Had EGD with no acute findings. Colonoscopy was cancelled per daughter due to multiple medical problems. She continued on eliquis 2.5 mg twice a day.  Palliative Care consulted and the patient and family request aggressive care for heart failure. Discharge weight was 152 pounds.   Admitted to [facility] 11/9 after RHC due to elevated filling pressures. Once diuresed she was put on torsemide 20 mg twice a day. She was also restarted on amiodarone 200 mg daily and continue on eliquis 2.5 mg twice a day. Discharge weight was 141 pounds.   Admitted 12/12 to 12/17 with recurrent HF and AF. Admit weight was 136. Was diuresed to 131. Diuresis limited by low BP. Demadex increased from 20 bid to 40 bid.   Admitted 12/06/14 - 12/09/14 for ADHF. Initial weight 134 pounds. Diuresed down to 130 and felt better. Treatment limited due to hypotension and worsening renal failure with creatinine up to 2.6. Renal u/s ok. Metolazone stopped.  She return for follow up: Evaluated in [facility] ED 2/12 with CP and nausea. No acute findings. Denies SOB/PND/Orthopnea. Denies nausea. Weight at home 133-134 pounds. No bleeding problems.  Following a low salt diet and  drinking less than 2L a day. No bleeding problems.   Studies:  - LHC (3/15): Mid LM 20-30%, mid RCA 30-40%, EF 65%  - Echo (4/15): Mild LVH, EF 60-65%, no RWMA, mild BAE, trivial eff  - Carotid US (5/15): 40-59% RICA stenosis. 5-88% LICA stenosis.   RHC 09/22/14  RA = 11 RV = 49/6/10 PA = 49/22 (34) PCW = 18 (v = 25) Fick cardiac output/index = 6.4/4.0 PVR = 2.5 WU FA sat = 95% PA sat = 72%,74%  Labs 07/18/14 K 3.5 Creatinine 1.44 Hgb 10.7  Labs 07/14/14 K 4.1 Creatinine 1.46  Hgb 9.8  Labs 07/04/14 TSH 2.7 Labs 09/06/14: K 4.0, creatinine 1.93 Labs 09/15/14 K 4.6 Creatinine 3.9  Labs 09/26/14 K 3.6 Creatinine 1.61 hgb 13.7 Labs 10/01/14 K 4.4 Creatinine 1.7 Hgb 14/1  Labs 10/30/14 K 3.9 Creatinine 1.4  Labs 12/26/2013: K 4.0 Creatinine 1.12 hgb 12.2 ROS: All systems negative except as listed in HPI, PMH and Problem List.  Past Medical History  Diagnosis Date  . Dyslipidemia   . Hypertension   . Palpitations     PVCs/bigeminy on event in May 2009 revealing this was relatively asymptomatic  . Chest pain     a. 2011 Neg MV;  b. 01/2014 Cath: LM 20-30, LAD nl, D1 nl, LCX nl, OM1 nl, RCA dom 30-3m, PD/PL nl, EF 65%->Med Rx.  . Degenerative joint disease   . Hx of varicella   . History of skin cancer     tafeen/ non melanoma.   . Thrombocytopenia   . Anemia   . Monoclonal  gammopathies   . Right lower quadrant abdominal pain 07/19/2012    Recurrent  With nausea    This is new since she had ct for llq pain in [facility]    R/o appendiceal problem  Hernia  Get ct scan  And plan  Fu     . Diastolic CHF, acute on chronic 01/31/2012  . Atrial fibrillation     a. Dx 01/2014->Eliquis started.  . Cancer   . Pacemaker   . Dysrhythmia     Current Outpatient Prescriptions  Medication Sig Dispense Refill  . acetaminophen (TYLENOL) 325 MG tablet Take 2 tablets (650 mg total) by mouth every 4 (four) hours as needed for headache or mild pain.    Marland Kitchen amiodarone (PACERONE) 200 MG tablet Take 1 tablet  (200 mg total) by mouth daily. 30 tablet 6  . apixaban (ELIQUIS) 2.5 MG TABS tablet Take 1 tablet (2.5 mg total) by mouth 2 (two) times daily. 60 tablet 0  . ferrous sulfate 325 (65 FE) MG tablet Take 1 tablet (325 mg total) by mouth 2 (two) times daily with a meal. 180 tablet 1  . levothyroxine (SYNTHROID, LEVOTHROID) 75 MCG tablet Take 1 tablet (75 mcg total) by mouth daily. 90 tablet 1  . loratadine (CLARITIN) 10 MG tablet Take 10 mg by mouth daily as needed for allergies.     Marland Kitchen LORazepam (ATIVAN) 0.5 MG tablet Take 0.5-1 tablets (0.25-0.5 mg total) by mouth every 8 (eight) hours as needed for anxiety. 30 tablet 1  . metolazone (ZAROXOLYN) 2.5 MG tablet Take 1 tablet (2.5 mg total) by mouth as needed (for weight 136 lb or greater). 10 tablet 3  . metoprolol tartrate (LOPRESSOR) 25 MG tablet Take 25 mg by mouth 2 (two) times daily.   3  . nitroGLYCERIN (NITROSTAT) 0.4 MG SL tablet Place 1 tablet (0.4 mg total) under the tongue every 5 (five) minutes as needed. For chest pain 25 tablet 6  . pantoprazole (PROTONIX) 40 MG tablet Take 1 tablet (40 mg total) by mouth daily. 30 tablet 5  . potassium chloride SA (KLOR-CON M20) 20 MEQ tablet Take 2 tablets (40 mEq total) by mouth 2 (two) times daily. 120 tablet 3  . senna (SENOKOT) 8.6 MG TABS tablet Take 2 tablets (17.2 mg total) by mouth daily as needed for mild constipation. 30 tablet 6  . simvastatin (ZOCOR) 10 MG tablet Take 1 tablet (10 mg total) by mouth at bedtime. 30 tablet 6  . torsemide (DEMADEX) 20 MG tablet Take 2 tablets (40 mg total) by mouth 2 (two) times daily. 120 tablet 3   No current facility-administered medications for this encounter.      Filed Vitals:   12/30/14 1425  BP: 116/56  Pulse: 63  Weight: 139 lb 4 oz (63.163 kg)  SpO2: 95%     PHYSICAL EXAM: General:  Elderly Frail appearing. No resp difficulty. Daughter present HEENT: normal except R cheek bandaide noted.    Neck: supple. JVP 6 . Carotids 2+ bilaterally; no  bruits. No lymphadenopathy or thryomegaly appreciated. Cor: PMI normal. Regular  rate &  rhythm. No rubs, gallops or murmurs. Lungs: clear Abdomen: soft, nontender, nondistended. No hepatosplenomegaly. No bruits or masses. Good bowel sounds. Extremities: no cyanosis, clubbing, rash, R and LLE severe varicose veins trace edema Neuro: alert & orientedx3, cranial nerves grossly intact. Moves all 4 extremities w/o difficulty. Affect pleasant.   ASSESSMENT & PLAN:  1. Chronic Diastolic Heart Failure - She has a very narrow euvolemic  window.  -Continue torsemide 40 mg twice a day.  Take 1/2 metolazone only for weight 136 or greater. Watch renal function carefully. Continue TED hose - Reinforced the need and importance of daily weights, a low sodium diet, and fluid restriction (less than 2 L a day). Instructed to call the HF clinic if weight increases more than 3 lbs overnight or 5 lbs in a week.  2.PAF -  symptomatic bradycardia s/p PPM.  previously thought to have failed amiodarone and had chronic AF. However in November in the hospital she was in NSR so amio restarted. - Continue Eliquis 2.5 mg twice a day. No bleeding problem 3. HTN- Ok today.  Continue current meds.  4. CKD stage III - BMET ok on 12/26/14  Follow up in   [facility] NP-C  2:25 PM  Patient seen and examined with Darrick Grinder, NP. We discussed all aspects of the encounter. I agree with the assessment and plan as stated above.   Doing very well. Volume status looks good. Reinforced need for daily weights and reviewed use of sliding scale diuretics. Continue Eliquis.   Daniel Bensimhon,MD 3:03 PM

## 2014-12-31 ENCOUNTER — Telehealth (HOSPITAL_COMMUNITY): Payer: Self-pay | Admitting: Vascular Surgery

## 2014-12-31 NOTE — Telephone Encounter (Signed)
Pt called to report weight increase, with mild LE edema, reports labor to breathe Weight today 137.8 on home scale Per office visit 12/30/14 ok to take metolazone for weight greater than [place] to take today and call office with update on 01/01/15

## 2015-01-01 ENCOUNTER — Encounter: Payer: Self-pay | Admitting: Cardiology

## 2015-01-05 ENCOUNTER — Encounter: Payer: Self-pay | Admitting: Internal Medicine

## 2015-01-09 ENCOUNTER — Telehealth (HOSPITAL_COMMUNITY): Payer: Self-pay | Admitting: Vascular Surgery

## 2015-01-09 NOTE — Telephone Encounter (Signed)
Physical Therapist for [place] [facility] needs a verbal order to see pt 2 twice a week for 2 weeks.. Please advise

## 2015-01-09 NOTE — Telephone Encounter (Signed)
Spoke w/Debbie, gave verbal for her to continue to see pt a few more weeks

## 2015-01-10 ENCOUNTER — Telehealth: Payer: Self-pay | Admitting: Physician Assistant

## 2015-01-10 NOTE — Telephone Encounter (Signed)
Patient stated she has sinus drainage on one side and is sneezing. She states that her oxygen is not staying in her nose. Well because of the sneezing and wants to know what to do. A nurse told her that she might need an antibiotic. She checked her oxygen saturation and it was 97%. Her weight is up a pound or 2, but not more.  The patient is not having purulent drainage, no fevers and no chills. Advised her that she could use over-the-counter antihistamines and over-the-counter steroid nasal sprays to help control her symptoms. She has Claritin and her home medication list and advised her this would be a good choice. Advised her that if her symptoms worsen or she develops fevers she should call back. Reminded her that she has Zaroxolyn to take if her weight goes up.  Ms. Osterberg seemed to think these interventions would be successful and had no other issues.  Rosaria Ferries, PA-C 01/10/2015 2:46 PM Beeper [phone]

## 2015-01-12 ENCOUNTER — Encounter (HOSPITAL_COMMUNITY): Payer: Self-pay

## 2015-01-12 ENCOUNTER — Emergency Department (HOSPITAL_COMMUNITY)
Admission: EM | Admit: 2015-01-12 | Discharge: 2015-01-12 | Disposition: A | Discharge status: Home / Self Care | Payer: Medicare Other | Attending: Emergency Medicine | Admitting: Emergency Medicine

## 2015-01-12 ENCOUNTER — Emergency Department (HOSPITAL_COMMUNITY): Payer: Medicare Other

## 2015-01-12 ENCOUNTER — Telehealth (HOSPITAL_COMMUNITY): Payer: Self-pay | Admitting: Vascular Surgery

## 2015-01-12 DIAGNOSIS — Z79899 Other long term (current) drug therapy: Secondary | ICD-10-CM | POA: Diagnosis not present

## 2015-01-12 DIAGNOSIS — M47812 Spondylosis without myelopathy or radiculopathy, cervical region: Secondary | ICD-10-CM | POA: Diagnosis not present

## 2015-01-12 DIAGNOSIS — I4891 Unspecified atrial fibrillation: Secondary | ICD-10-CM | POA: Insufficient documentation

## 2015-01-12 DIAGNOSIS — E785 Hyperlipidemia, unspecified: Secondary | ICD-10-CM | POA: Insufficient documentation

## 2015-01-12 DIAGNOSIS — D649 Anemia, unspecified: Secondary | ICD-10-CM | POA: Insufficient documentation

## 2015-01-12 DIAGNOSIS — R51 Headache: Secondary | ICD-10-CM | POA: Diagnosis not present

## 2015-01-12 DIAGNOSIS — R519 Headache, unspecified: Secondary | ICD-10-CM

## 2015-01-12 DIAGNOSIS — I5033 Acute on chronic diastolic (congestive) heart failure: Secondary | ICD-10-CM | POA: Diagnosis not present

## 2015-01-12 DIAGNOSIS — Z7902 Long term (current) use of antithrombotics/antiplatelets: Secondary | ICD-10-CM | POA: Insufficient documentation

## 2015-01-12 DIAGNOSIS — Z85828 Personal history of other malignant neoplasm of skin: Secondary | ICD-10-CM | POA: Insufficient documentation

## 2015-01-12 DIAGNOSIS — I10 Essential (primary) hypertension: Secondary | ICD-10-CM | POA: Diagnosis not present

## 2015-01-12 DIAGNOSIS — Z8619 Personal history of other infectious and parasitic diseases: Secondary | ICD-10-CM | POA: Diagnosis not present

## 2015-01-12 DIAGNOSIS — Z950 Presence of cardiac pacemaker: Secondary | ICD-10-CM | POA: Insufficient documentation

## 2015-01-12 LAB — COMPREHENSIVE METABOLIC PANEL
ALBUMIN: 3.4 g/dL — AB (ref 3.5–5.2)
ALT: 17 U/L (ref 0–35)
AST: 22 U/L (ref 0–37)
Alkaline Phosphatase: 85 U/L (ref 39–117)
Anion gap: 7 (ref 5–15)
BUN: 34 mg/dL — AB (ref 6–23)
CO2: 27 mmol/L (ref 19–32)
Calcium: 9.4 mg/dL (ref 8.4–10.5)
Chloride: 107 mmol/L (ref 96–112)
Creatinine, Ser: 2.21 mg/dL — ABNORMAL HIGH (ref 0.50–1.10)
GFR calc non Af Amer: 19 mL/min — ABNORMAL LOW (ref >90)
GFR, EST AFRICAN AMERICAN: 22 mL/min — AB (ref >90)
GLUCOSE: 94 mg/dL (ref 70–99)
POTASSIUM: 4.2 mmol/L (ref 3.5–5.1)
Sodium: 141 mmol/L (ref 135–145)
TOTAL PROTEIN: 6.4 g/dL (ref 6.0–8.3)
Total Bilirubin: 1.1 mg/dL (ref 0.3–1.2)

## 2015-01-12 LAB — CBC WITH DIFFERENTIAL/PLATELET
Basophils Absolute: 0 10*3/uL (ref 0.0–0.1)
Basophils Relative: 1 % (ref 0–1)
EOS ABS: 0.1 10*3/uL (ref 0.0–0.7)
Eosinophils Relative: 3 % (ref 0–5)
HCT: 37.1 % (ref 36.0–46.0)
HEMOGLOBIN: 12 g/dL (ref 12.0–15.0)
LYMPHS ABS: 0.9 10*3/uL (ref 0.7–4.0)
Lymphocytes Relative: 20 % (ref 12–46)
MCH: 32.2 pg (ref 26.0–34.0)
MCHC: 32.3 g/dL (ref 30.0–36.0)
MCV: 99.5 fL (ref 78.0–100.0)
MONOS PCT: 8 % (ref 3–12)
Monocytes Absolute: 0.4 10*3/uL (ref 0.1–1.0)
NEUTROS ABS: 2.9 10*3/uL (ref 1.7–7.7)
NEUTROS PCT: 68 % (ref 43–77)
PLATELETS: 118 10*3/uL — AB (ref 150–400)
RBC: 3.73 MIL/uL — AB (ref 3.87–5.11)
RDW: 14.5 % (ref 11.5–15.5)
WBC: 4.2 10*3/uL (ref 4.0–10.5)

## 2015-01-12 LAB — I-STAT TROPONIN, ED: TROPONIN I, POC: 0.07 ng/mL (ref 0.00–0.08)

## 2015-01-12 LAB — LACTIC ACID, PLASMA: Lactic Acid, Venous: 1 mmol/L (ref 0.5–2.0)

## 2015-01-12 MED ORDER — ACETAMINOPHEN 325 MG PO TABS
650.0000 mg | ORAL_TABLET | Freq: Once | ORAL | Status: AC
Start: 2015-01-12 — End: 2015-01-12
  Administered 2015-01-12: 650 mg via ORAL
  Filled 2015-01-12: qty 2

## 2015-01-12 MED ORDER — SODIUM CHLORIDE 0.9 % IV BOLUS (SEPSIS)
500.0000 mL | Freq: Once | INTRAVENOUS | Status: AC
  Administered 2015-01-12: 500 mL via INTRAVENOUS

## 2015-01-12 NOTE — Discharge Instructions (Signed)
Your headache appears to be from arthritis in your upper neck.  If you are worse at any time, please be reevaluated.  Your labs appear normal for you- with creatinine elevated but where yours has been.  Please recheck with your doctor if not better.  Use acetaminophen for pain.   Osteoarthritis Osteoarthritis is a disease that causes soreness and inflammation of a joint. It occurs when the cartilage at the affected joint wears down. Cartilage acts as a cushion, covering the ends of bones where they meet to form a joint. Osteoarthritis is the most common form of arthritis. It often occurs in older people. The joints affected most often by this condition include those in the:  Ends of the fingers.  Thumbs.  Neck.  Lower back.  Knees.  Hips. CAUSES  Over time, the cartilage that covers the ends of bones begins to wear away. This causes bone to rub on bone, producing pain and stiffness in the affected joints.  RISK FACTORS Certain factors can increase your chances of having osteoarthritis, including:  Older age.  Excessive body weight.  Overuse of joints.  Previous joint injury. SIGNS AND SYMPTOMS   Pain, swelling, and stiffness in the joint.  Over time, the joint may lose its normal shape.  Small deposits of bone (osteophytes) may grow on the edges of the joint.  Bits of bone or cartilage can break off and float inside the joint space. This may cause more pain and damage. DIAGNOSIS  Your health care provider will do a physical exam and ask about your symptoms. Various tests may be ordered, such as:  X-rays of the affected joint.  An MRI scan.  Blood tests to rule out other types of arthritis.  Joint fluid tests. This involves using a needle to draw fluid from the joint and examining the fluid under a microscope. TREATMENT  Goals of treatment are to control pain and improve joint function. Treatment plans may include:  A prescribed exercise program that allows for rest  and joint relief.  A weight control plan.  Pain relief techniques, such as:  Properly applied heat and cold.  Electric pulses delivered to nerve endings under the skin (transcutaneous electrical nerve stimulation [TENS]).  Massage.  Certain nutritional supplements.  Medicines to control pain, such as:  Acetaminophen.  Nonsteroidal anti-inflammatory drugs (NSAIDs), such as naproxen.  Narcotic or central-acting agents, such as tramadol.  Corticosteroids. These can be given orally or as an injection.  Surgery to reposition the bones and relieve pain (osteotomy) or to remove loose pieces of bone and cartilage. Joint replacement may be needed in advanced states of osteoarthritis. HOME CARE INSTRUCTIONS   Take medicines only as directed by your health care provider.  Maintain a healthy weight. Follow your health care provider's instructions for weight control. This may include dietary instructions.  Exercise as directed. Your health care provider can recommend specific types of exercise. These may include:  Strengthening exercises. These are done to strengthen the muscles that support joints affected by arthritis. They can be performed with weights or with exercise bands to add resistance.  Aerobic activities. These are exercises, such as brisk walking or low-impact aerobics, that get your heart pumping.  Range-of-motion activities. These keep your joints limber.  Balance and agility exercises. These help you maintain daily living skills.  Rest your affected joints as directed by your health care provider.  Keep all follow-up visits as directed by your health care provider. SEEK MEDICAL CARE IF:   Your  skin turns red.  You develop a rash in addition to your joint pain.  You have worsening joint pain.  You have a fever along with joint or muscle aches. SEEK IMMEDIATE MEDICAL CARE IF:  You have a significant loss of weight or appetite.  You have night sweats. [place] of Arthritis and Musculoskeletal and Skin Diseases: www.niams.SouthExposed.es  [employer] on Aging: http://kim-miller.com/  American College of Rheumatology: www.rheumatology.org Document Released: 10/31/2005 Document Revised: 03/17/2014 Document Reviewed: 07/08/2013 [facility] Patient Information 2015 [place], [place]. This information is not intended to replace advice given to you by your health care provider. Make sure you discuss any questions you have with your health care provider.

## 2015-01-12 NOTE — ED Provider Notes (Addendum)
CSN: [id number]     Arrival date & time 01/12/15  1403 History   First MD Initiated Contact with Patient 01/12/15 1420     Chief Complaint  Patient presents with  . Headache     (Consider location/radiation/quality/duration/timing/severity/associated sxs/prior Treatment) HPI 81 year old female with history of hypertension, diastolic congestive heart failure, atrial fibrillation who presents today stating that she has tingling in the back of her head. She states that she woke up during the night and had pins and needles to besides her face. This resolved and she began having tingling in the back of her head at the point where it meets her neck. It is not lateralized. She has had some headaches in the past but have not been similar to this. It is not severe. She has not had any visual changes, lateralized weakness, change in ability to ambulate, speech or understanding problems. She lives alone. She has a history of congestive heart failure and was recently hospitalized for this. Her blood pressure was in the 90s when she left the hospital. This is where her blood pressure has been recently. She denies falls or head injury. She states that she initially felt like she had some decreased vision in her left eye in the middle the night when it was dark but does not have any problems today want to light. She has been eating as normally. She had an episode of chest heaviness during the night which she states is like it has been multiple times in the past. This resolved without intervention. She is dyspneic at baseline. She denies any abdominal pain. Past Medical History  Diagnosis Date  . Dyslipidemia   . Hypertension   . Palpitations     PVCs/bigeminy on event in May 2009 revealing this was relatively asymptomatic  . Chest pain     a. 2011 Neg MV;  b. 01/2014 Cath: LM 20-30, LAD nl, D1 nl, LCX nl, OM1 nl, RCA dom 30-72m, PD/PL nl, EF 65%->Med Rx.  . Degenerative joint disease   . Hx of varicella   .  History of skin cancer     tafeen/ non melanoma.   . Thrombocytopenia   . Anemia   . Monoclonal gammopathies   . Right lower quadrant abdominal pain 07/19/2012    Recurrent  With nausea    This is new since she had ct for llq pain in [facility]    R/o appendiceal problem  Hernia  Get ct scan  And plan  Fu     . Diastolic CHF, acute on chronic 01/31/2012  . Atrial fibrillation     a. Dx 01/2014->Eliquis started.  . Cancer   . Pacemaker   . Dysrhythmia    Past Surgical History  Procedure Laterality Date  . Cholecystectomy  1999  . Cesarean section      times 2  . Shoulder surgery  1996    Right   . Cataract extraction      Bilateral  implantt  . Cardioversion N/A 02/26/2014    Procedure: CARDIOVERSION AT BEDSIDE;  Surgeon: Pixie Casino, MD;  Location: [place];  Service: Cardiovascular;  Laterality: N/A;  . Cardioversion N/A 04/22/2014    Procedure: CARDIOVERSION (BEDSIDE);  Surgeon: [name], MD;  Location: [facility] OR;  Service: Cardiovascular;  Laterality: N/A;  . Pacemaker insertion  04/23/2014    STJ Assurity dual chamber pacemaker implanted by Dr Rayann Heman  . Insert / replace / remove pacemaker    . Esophagogastroduodenoscopy N/A 07/06/2014  Procedure: ESOPHAGOGASTRODUODENOSCOPY (EGD);  Surgeon: Ladene Artist, MD;  Location: [facility] ENDOSCOPY;  Service: Endoscopy;  Laterality: N/A;  . Left heart catheterization with coronary angiogram N/A 01/29/2014    Procedure: LEFT HEART CATHETERIZATION WITH CORONARY ANGIOGRAM;  Surgeon: Blane Ohara, MD;  Location: [facility] CATH LAB;  Service: Cardiovascular;  Laterality: N/A;  . Permanent pacemaker insertion N/A 04/23/2014    Procedure: PERMANENT PACEMAKER INSERTION;  Surgeon: Evans Lance, MD;  Location: [facility] CATH LAB;  Service: Cardiovascular;  Laterality: N/A;  . Right heart catheterization N/A 09/22/2014    Procedure: RIGHT HEART CATH;  Surgeon: Jolaine Artist, MD;  Location: [facility] CATH LAB;  Service: Cardiovascular;  Laterality: N/A;   Family History   Problem Relation Age of Onset  . Coronary artery disease Father   . Heart disease Father   . Lung cancer    . Alzheimer's disease Mother   . Cancer Brother 36    lung cancer  . Heart attack Father    History  Substance Use Topics  . Smoking status: Never Smoker   . Smokeless tobacco: Never Used  . Alcohol Use: No   OB History    No data available     Review of Systems  All other systems reviewed and are negative.     Allergies  Tramadol  Home Medications   Prior to Admission medications   Medication Sig Start Date End Date Taking? Authorizing Provider  acetaminophen (TYLENOL) 325 MG tablet Take 2 tablets (650 mg total) by mouth every 4 (four) hours as needed for headache or mild pain. 09/26/14   Isaiah Serge, NP  amiodarone (PACERONE) 200 MG tablet Take 1 tablet (200 mg total) by mouth daily. 11/06/14   Jolaine Artist, MD  apixaban (ELIQUIS) 2.5 MG TABS tablet Take 1 tablet (2.5 mg total) by mouth 2 (two) times daily. 12/30/14   Jolaine Artist, MD  ferrous sulfate 325 (65 FE) MG tablet Take 1 tablet (325 mg total) by mouth 2 (two) times daily with a meal. 11/21/14   Burnis Medin, MD  levothyroxine (SYNTHROID, LEVOTHROID) 75 MCG tablet Take 1 tablet (75 mcg total) by mouth daily. 11/21/14   Burnis Medin, MD  loratadine (CLARITIN) 10 MG tablet Take 10 mg by mouth daily as needed for allergies.     Historical Provider, MD  LORazepam (ATIVAN) 0.5 MG tablet Take 0.5-1 tablets (0.25-0.5 mg total) by mouth every 8 (eight) hours as needed for anxiety. 09/09/14   Burnis Medin, MD  metolazone (ZAROXOLYN) 2.5 MG tablet Take 1 tablet (2.5 mg total) by mouth as needed (for weight 136 lb or greater). 12/16/14   Jolaine Artist, MD  metoprolol tartrate (LOPRESSOR) 25 MG tablet Take 25 mg by mouth 2 (two) times daily.  11/20/14   Historical Provider, MD  nitroGLYCERIN (NITROSTAT) 0.4 MG SL tablet Place 1 tablet (0.4 mg total) under the tongue every 5 (five) minutes as needed. For  chest pain 12/02/13   Fay Records, MD  pantoprazole (PROTONIX) 40 MG tablet Take 1 tablet (40 mg total) by mouth daily. 08/11/14   Burnis Medin, MD  potassium chloride SA (KLOR-CON M20) 20 MEQ tablet Take 2 tablets (40 mEq total) by mouth 2 (two) times daily. 10/30/14   Almyra Deforest, PA  senna (SENOKOT) 8.6 MG TABS tablet Take 2 tablets (17.2 mg total) by mouth daily as needed for mild constipation. 09/26/14   Isaiah Serge, NP  simvastatin (ZOCOR) 10 MG tablet  Take 1 tablet (10 mg total) by mouth at bedtime. 11/06/14   Jolaine Artist, MD  torsemide (DEMADEX) 20 MG tablet Take 2 tablets (40 mg total) by mouth 2 (two) times daily. 12/10/14   Brittainy M Simmons, PA-C   BP 95/43 mmHg  Pulse 62  Temp(Src) 98.1 F (36.7 C) (Oral)  Resp 18  Ht 4\' 10"  (1.473 m)  Wt 133 lb (60.328 kg)  BMI 27.80 kg/m2  SpO2 92% Physical Exam  Constitutional: She appears well-developed and well-nourished.  HENT:  Head: Normocephalic and atraumatic.  Right Ear: External ear normal.  Left Ear: External ear normal.  Mouth/Throat: Oropharynx is clear and moist.  Eyes: Conjunctivae and EOM are normal. Pupils are equal, round, and reactive to light.  Neck: Normal range of motion. Neck supple.  Neurological:  No visual field deficits  Nursing note and vitals reviewed.   ED Course  Procedures (including critical care time) Labs Review Labs Reviewed  CBC WITH DIFFERENTIAL/PLATELET  COMPREHENSIVE METABOLIC PANEL  LACTIC ACID, PLASMA  I-STAT TROPOININ, ED    Imaging Review Ct Head Wo Contrast  01/12/2015   CLINICAL DATA:  Dysesthesias in head and neck  EXAM: CT HEAD WITHOUT CONTRAST  CT CERVICAL SPINE WITHOUT CONTRAST  TECHNIQUE: Multidetector CT imaging of the head and cervical spine was performed following the standard protocol without intravenous contrast. Multiplanar CT image reconstructions of the cervical spine were also generated.  COMPARISON:  Head CT March 01, 2014  FINDINGS: CT HEAD FINDINGS  There  is age related volume loss. There is no intracranial mass, hemorrhage, extra-axial fluid collection, or midline shift. There is mild patchy small vessel disease in the centra semiovale bilaterally. Gray-white compartments elsewhere appear normal. No acute infarct apparent. Bony calvarium appears intact. The mastoid air cells are clear. There is debris in the right external auditory canal.  CT CERVICAL SPINE FINDINGS  There is no fracture or spondylolisthesis. Prevertebral soft tissues and predental space regions are normal. There is marked disc space narrowing at C5-6 and C6-7. There is slightly milder narrowing at C7-T1. There is moderate facet osteoarthritic change at all levels except for C2-3. There is exit foraminal narrowing at C4-5 on the left and sent to a slightly lesser extent at C5-6 on the left due to bony hypertrophy. There is no frank disc extrusion or stenosis. There is carotid artery calcification bilaterally. There is somewhat generalized enlargement of the right lobe of the thyroid compared to the left.  IMPRESSION: CT head: Age related volume loss with mild patchy periventricular small vessel disease. No intracranial mass, hemorrhage, or acute appearing infarct. Probable cerumen in the right external auditory canal.  CT cervical spine: Multifocal osteoarthritic change. No fracture or spondylolisthesis. No frank disc extrusion or stenosis. There is carotid artery calcification bilaterally. Enlargement of the right lobe of the thyroid compared to the left is noted. This finding may warrant nonemergent thyroid ultrasound for further assessment.   Electronically Signed   By: Lowella Grip III M.D.   On: 01/12/2015 15:53   Ct Cervical Spine Wo Contrast  01/12/2015   CLINICAL DATA:  Dysesthesias in head and neck  EXAM: CT HEAD WITHOUT CONTRAST  CT CERVICAL SPINE WITHOUT CONTRAST  TECHNIQUE: Multidetector CT imaging of the head and cervical spine was performed following the standard protocol  without intravenous contrast. Multiplanar CT image reconstructions of the cervical spine were also generated.  COMPARISON:  Head CT March 01, 2014  FINDINGS: CT HEAD FINDINGS  There is age related volume  loss. There is no intracranial mass, hemorrhage, extra-axial fluid collection, or midline shift. There is mild patchy small vessel disease in the centra semiovale bilaterally. Gray-white compartments elsewhere appear normal. No acute infarct apparent. Bony calvarium appears intact. The mastoid air cells are clear. There is debris in the right external auditory canal.  CT CERVICAL SPINE FINDINGS  There is no fracture or spondylolisthesis. Prevertebral soft tissues and predental space regions are normal. There is marked disc space narrowing at C5-6 and C6-7. There is slightly milder narrowing at C7-T1. There is moderate facet osteoarthritic change at all levels except for C2-3. There is exit foraminal narrowing at C4-5 on the left and sent to a slightly lesser extent at C5-6 on the left due to bony hypertrophy. There is no frank disc extrusion or stenosis. There is carotid artery calcification bilaterally. There is somewhat generalized enlargement of the right lobe of the thyroid compared to the left.  IMPRESSION: CT head: Age related volume loss with mild patchy periventricular small vessel disease. No intracranial mass, hemorrhage, or acute appearing infarct. Probable cerumen in the right external auditory canal.  CT cervical spine: Multifocal osteoarthritic change. No fracture or spondylolisthesis. No frank disc extrusion or stenosis. There is carotid artery calcification bilaterally. Enlargement of the right lobe of the thyroid compared to the left is noted. This finding may warrant nonemergent thyroid ultrasound for further assessment.   Electronically Signed   By: Lowella Grip III M.D.   On: 01/12/2015 15:53   Dg Chest Port 1 View  01/12/2015   CLINICAL DATA:  Chest heaviness.  Hypertension  EXAM:  PORTABLE CHEST - 1 VIEW  COMPARISON:  December 26, 2014  FINDINGS: Lungs are clear. Heart is mildly enlarged with pulmonary vascularity within normal limits. No adenopathy. No pneumothorax. Pacemaker leads are attached to the right atrium and right ventricle. No bone lesions.  IMPRESSION: Stable cardiac enlargement. No edema or consolidation. No change in pacemaker lead positions.   Electronically Signed   By: Lowella Grip III M.D.   On: 01/12/2015 15:20     EKG Interpretation   Date/Time:  Monday January 12 2015 14:16:47 EST Ventricular Rate:  64 PR Interval:  186 QRS Duration: 152 QT Interval:  513 QTC Calculation: 529 R Axis:   86 Text Interpretation:  Normal sinus rhythm intermittently  AV PACED RHYTHM  eft bundle branch block Confirmed by Florena Kozma MD, Payton Moder ([id number]) on 01/12/2015  2:49:42 PM      MDM   Final diagnoses:  Head pain    Head pain is improved.  No other complaints.  Patient ambulatory at baseline.  Pain in neck likely secondary to djd. Chronic hypotension appears at baseline.    Renal insufficiency - stable chf- stable  Shaune Pollack, MD 01/12/15 1705  Shaune Pollack, MD 01/12/15 [phone]

## 2015-01-12 NOTE — ED Notes (Signed)
[facility]- Pt coming from home has has ongoing headache since around 0200. Also reports some lightheadedness. Pt describes pain as pins and needles in the back of her head. Hypotensive with EMS at 84/46 initially, received around 500 cc of fluid with pressure increasing to 91/52. Alert and oriented on arrival.

## 2015-01-12 NOTE — Telephone Encounter (Signed)
Pt called she is having chest pressure .Marland Kitchen Please advise

## 2015-01-12 NOTE — Telephone Encounter (Signed)
Patient states she had "chest pressure", no pain, overnight and this morning, still coming and going.  States pressure feels very heavy and kept her up all night and also is making her short of breath requiring her to turn up her oxygen even though her sats say 98%, and that she is having "tingling like pin needles" on her face.  Advised her to have a family member or EMS drive her to the emergency room to make sure this is not related to her history of previous MI and blockages.  Aware and agreeable.  [name]

## 2015-01-13 ENCOUNTER — Telehealth (HOSPITAL_COMMUNITY): Payer: Self-pay | Admitting: Vascular Surgery

## 2015-01-13 NOTE — Telephone Encounter (Signed)
Pt called she is not feeling well , increased SOB, she states she is SOB even with 02 on.. Please advise

## 2015-01-13 NOTE — Telephone Encounter (Signed)
Spoke w/pt, she was seen in ER yeterday and everything was ok, however she states she is still very SOB and it seems to be worse than yesterday.  Her wt is stable at 133 lb today, she usually runs 131-133 lb.  She states she is taking her Torsemide 40 mg Twice daily and she has not taken her metolazone as she is only suppose to take if for wt of 136 or greater.  She denies edema.  Will discuss w/MD and call her back

## 2015-01-13 NOTE — Telephone Encounter (Signed)
Discussed w/Linda Ninfa Meeker, NP pt, as chest x-ray and labs done in ER 2/29 were normal and pt's wt is stable she does not feel that SOB is from heart failure would recommend pt see pcp, have attempted to call pt back with no answer, Left message to call back

## 2015-01-14 NOTE — Telephone Encounter (Signed)
Spoke w/pt, she states wt 132 lb today and she is feeling better, SOB has improved

## 2015-01-20 ENCOUNTER — Telehealth (HOSPITAL_COMMUNITY): Payer: Self-pay | Admitting: Cardiology

## 2015-01-20 NOTE — Telephone Encounter (Signed)
Pt called with c/o L sided chest pains that sometimes radiates to R side Increased SOB even while wearing 2L O2 Weight stable at 131.8 lbs Denied edema  per VO Amy Clegg, Np Pt should report to ER for further evaluation of CP

## 2015-01-28 ENCOUNTER — Ambulatory Visit (HOSPITAL_COMMUNITY)
Admission: RE | Admit: 2015-01-28 | Discharge: 2015-01-28 | Disposition: A | Discharge status: Home / Self Care | Payer: Medicare Other | Source: Ambulatory Visit | Attending: Internal Medicine | Admitting: Internal Medicine

## 2015-01-28 ENCOUNTER — Encounter (HOSPITAL_COMMUNITY): Payer: Self-pay

## 2015-01-28 VITALS — BP 102/60 | HR 59 | Wt 137.2 lb

## 2015-01-28 DIAGNOSIS — Z7901 Long term (current) use of anticoagulants: Secondary | ICD-10-CM | POA: Diagnosis not present

## 2015-01-28 DIAGNOSIS — I480 Paroxysmal atrial fibrillation: Secondary | ICD-10-CM | POA: Diagnosis not present

## 2015-01-28 DIAGNOSIS — E785 Hyperlipidemia, unspecified: Secondary | ICD-10-CM | POA: Insufficient documentation

## 2015-01-28 DIAGNOSIS — Z950 Presence of cardiac pacemaker: Secondary | ICD-10-CM | POA: Insufficient documentation

## 2015-01-28 DIAGNOSIS — N183 Chronic kidney disease, stage 3 (moderate): Secondary | ICD-10-CM | POA: Diagnosis not present

## 2015-01-28 DIAGNOSIS — I129 Hypertensive chronic kidney disease with stage 1 through stage 4 chronic kidney disease, or unspecified chronic kidney disease: Secondary | ICD-10-CM | POA: Insufficient documentation

## 2015-01-28 DIAGNOSIS — I5032 Chronic diastolic (congestive) heart failure: Secondary | ICD-10-CM | POA: Insufficient documentation

## 2015-01-28 DIAGNOSIS — R001 Bradycardia, unspecified: Secondary | ICD-10-CM | POA: Insufficient documentation

## 2015-01-28 DIAGNOSIS — Z79899 Other long term (current) drug therapy: Secondary | ICD-10-CM | POA: Diagnosis not present

## 2015-01-28 DIAGNOSIS — I481 Persistent atrial fibrillation: Secondary | ICD-10-CM | POA: Diagnosis not present

## 2015-01-28 NOTE — Progress Notes (Signed)
Patient ID: [name], female   DOB: Aug 17, 1925, 81 y.o.   MRN: [id number]  PCP: Dr Netty Starring Primary Cardiologist: Dr. Harrington Challenger  HPI: [name] is a 81 y.o. female with the history of nonobstructive CAD by cardiac catheterization in 01/2014, persistent atrial fibrillation, diastolic CHF, HTN, HL, anemia, and CKD. Patient was admitted 04/2014 x2 with acute on chronic diastolic CHF. She underwent cardioversion with restoration of NSR. This resulted in profound bradycardia and hypotension requiring pacemaker implantation. Pacemaker implant was complicated by acute blood loss anemia requiring transfusion with PRBCs  Admitted to [facility] August 21 with increased dyspnea. Diuresed with IV lasix and transitioned to po lasix 40 mg twice a day. GI evaluated due to anemia and black stool. Had EGD with no acute findings. Colonoscopy was cancelled per daughter due to multiple medical problems. She continued on eliquis 2.5 mg twice a day.  Palliative Care consulted and the patient and family request aggressive care for heart failure. Discharge weight was 152 pounds.   Admitted to [facility] 11/9 after RHC due to elevated filling pressures. Once diuresed she was put on torsemide 20 mg twice a day. She was also restarted on amiodarone 200 mg daily and continue on eliquis 2.5 mg twice a day. Discharge weight was 141 pounds.   Admitted 12/12 to 12/17 with recurrent HF and AF. Admit weight was 136. Was diuresed to 131. Diuresis limited by low BP. Demadex increased from 20 bid to 40 bid.   Admitted 12/06/14 - 12/09/14 for ADHF. Initial weight 134 pounds. Diuresed down to 130 and felt better. Treatment limited due to hypotension and worsening renal failure with creatinine up to 2.6. Renal u/s ok. Metolazone stopped.  She return for follow up: Here with daughter. At home weight has been 129-136 pounds but she says it is usually 130-133 pounds.  SBP has been 95-130.  Overall she feels better. Denies SOB.. No orthopnea or PND.  Edema improved.  Able to vacum at home. Following a low salt diet and drinking less than 2L a day. No bleeding problems. Followed by [facility] Go.   Studies:  - LHC (3/15): Mid LM 20-30%, mid RCA 30-40%, EF 65%  - Echo (4/15): Mild LVH, EF 60-65%, no RWMA, mild BAE, trivial eff  - Carotid US (5/15): 40-59% RICA stenosis. 3-32% LICA stenosis.   RHC 09/22/14  RA = 11 RV = 49/6/10 PA = 49/22 (34) PCW = 18 (v = 25) Fick cardiac output/index = 6.4/4.0 PVR = 2.5 WU FA sat = 95% PA sat = 72%,74%  Labs 07/18/14 K 3.5 Creatinine 1.44 Hgb 10.7  Labs 07/14/14 K 4.1 Creatinine 1.46  Hgb 9.8  Labs 07/04/14 TSH 2.7 Labs 09/06/14: K 4.0, creatinine 1.93 Labs 09/15/14 K 4.6 Creatinine 3.9  Labs 09/26/14 K 3.6 Creatinine 1.61 hgb 13.7 Labs 10/01/14 K 4.4 Creatinine 1.7 Hgb 14/1  Labs 10/30/14 K 3.9 Creatinine 1.4  Labs 01/12/2015: K 4.2 Creatinine 2.22  ROS: All systems negative except as listed in HPI, PMH and Problem List.  Past Medical History  Diagnosis Date  . Dyslipidemia   . Hypertension   . Palpitations     PVCs/bigeminy on event in May 2009 revealing this was relatively asymptomatic  . Chest pain     a. 2011 Neg MV;  b. 01/2014 Cath: LM 20-30, LAD nl, D1 nl, LCX nl, OM1 nl, RCA dom 30-31m, PD/PL nl, EF 65%->Med Rx.  . Degenerative joint disease   . Hx of varicella   . History of skin  cancer     tafeen/ non melanoma.   . Thrombocytopenia   . Anemia   . Monoclonal gammopathies   . Right lower quadrant abdominal pain 07/19/2012    Recurrent  With nausea    This is new since she had ct for llq pain in [facility]    R/o appendiceal problem  Hernia  Get ct scan  And plan  Fu     . Diastolic CHF, acute on chronic 01/31/2012  . Atrial fibrillation     a. Dx 01/2014->Eliquis started.  . Cancer   . Pacemaker   . Dysrhythmia     Current Outpatient Prescriptions  Medication Sig Dispense Refill  . acetaminophen (TYLENOL) 325 MG tablet Take 2 tablets (650 mg total) by mouth every 4 (four) hours as needed  for headache or mild pain.    Marland Kitchen amiodarone (PACERONE) 200 MG tablet Take 1 tablet (200 mg total) by mouth daily. 30 tablet 6  . apixaban (ELIQUIS) 2.5 MG TABS tablet Take 1 tablet (2.5 mg total) by mouth 2 (two) times daily. 60 tablet 6  . ferrous sulfate 325 (65 FE) MG tablet Take 1 tablet (325 mg total) by mouth 2 (two) times daily with a meal. 180 tablet 1  . levothyroxine (SYNTHROID, LEVOTHROID) 75 MCG tablet Take 1 tablet (75 mcg total) by mouth daily. 90 tablet 1  . loratadine (CLARITIN) 10 MG tablet Take 10 mg by mouth daily as needed for allergies.     Marland Kitchen LORazepam (ATIVAN) 0.5 MG tablet Take 0.5-1 tablets (0.25-0.5 mg total) by mouth every 8 (eight) hours as needed for anxiety. 30 tablet 1  . metolazone (ZAROXOLYN) 2.5 MG tablet Take 1 tablet (2.5 mg total) by mouth as needed (for weight 136 lb or greater). 10 tablet 3  . metoprolol tartrate (LOPRESSOR) 25 MG tablet Take 25 mg by mouth 2 (two) times daily.   3  . nitroGLYCERIN (NITROSTAT) 0.4 MG SL tablet Place 1 tablet (0.4 mg total) under the tongue every 5 (five) minutes as needed. For chest pain 25 tablet 6  . pantoprazole (PROTONIX) 40 MG tablet Take 1 tablet (40 mg total) by mouth daily. (Patient not taking: Reported on 01/12/2015) 30 tablet 5  . potassium chloride SA (KLOR-CON M20) 20 MEQ tablet Take 2 tablets (40 mEq total) by mouth 2 (two) times daily. 120 tablet 3  . senna (SENOKOT) 8.6 MG TABS tablet Take 2 tablets (17.2 mg total) by mouth daily as needed for mild constipation. (Patient not taking: Reported on 01/12/2015) 30 tablet 6  . simvastatin (ZOCOR) 10 MG tablet Take 1 tablet (10 mg total) by mouth at bedtime. 30 tablet 6  . torsemide (DEMADEX) 20 MG tablet Take 2 tablets (40 mg total) by mouth 2 (two) times daily. 120 tablet 3   No current facility-administered medications for this encounter.      Filed Vitals:   01/28/15 1353  BP: 102/60  Pulse: 59  Weight: 137 lb 4 oz (62.256 kg)  SpO2: 95%     PHYSICAL  EXAM: General:  Elderly Frail appearing. No resp difficulty. Daughter present HEENT: normal except  Neck: supple. JVP 5-6. Carotids 2+ bilaterally; no bruits. No lymphadenopathy or thryomegaly appreciated. Cor: PMI normal. Regular  rate &  rhythm. No rubs, gallops or murmurs. Lungs: clear Abdomen: soft, nontender, nondistended. No hepatosplenomegaly. No bruits or masses. Good bowel sounds. Extremities: no cyanosis, clubbing, rash, R and LLE severe varicose veins no edema.   Neuro: alert & orientedx3, cranial nerves grossly  intact. Moves all 4 extremities w/o difficulty. Affect pleasant.   ASSESSMENT & PLAN:  1. Chronic Diastolic Heart Failure - She has a very narrow euvolemic window. Today she is euvolemic.  -Continue torsemide 40 mg twice a day.  Take metolazone only for weight 136 or greater.  I reviewed BP and weight from [place].   Reinforced the need and importance of daily weights, a low sodium diet, and fluid restriction (less than 2 L a day). Instructed to call the HF clinic if weight increases more than 3 lbs overnight or 5 lbs in a week.  2.PAF -  symptomatic bradycardia s/p PPM.  Peviously thought to have failed amiodarone and had chronic AF. However in November in the hospital she was in NSR so amio restarted. - Continue amiodarone 200 mg daily. Continue Eliquis 2.5 mg twice a day. No bleeding problems.   3. HTN- Ok today.  Continue current meds.  4. CKD stage III - Reviewed BMET form 01/12/2015 Renal function ok.  5. Deconditioning- Continue HHPT.   Follow up in 3 months.    Ayslin Kundert NP-C  1:46 PM

## 2015-01-28 NOTE — Patient Instructions (Signed)
FOLLOW UP in 3 months.

## 2015-01-31 ENCOUNTER — Telehealth: Payer: Self-pay | Admitting: Cardiology

## 2015-01-31 NOTE — Telephone Encounter (Signed)
   Patient followed by [facility] for chronic diastolic CHF. She is on chronic home O2 at 2L. She calls after-hours stating that she feels more SOB today. She checked O2 sats at home and levels are stable at 97%. She denies weight gain or edema. She reports that she ate at a steakhouse last night and had chicken breast with seasoning on it and a baked potato with butter. She failed to take her Torsemide this am because she "overslept". Patient instructed to take am dose of Torsemide now and to avoid sodium. She is to call back if symptoms worsen or fail to improve. She verbalized understanding.   SIMMONS, BRITTAINY 01/31/2015

## 2015-02-10 ENCOUNTER — Other Ambulatory Visit (HOSPITAL_COMMUNITY): Payer: Self-pay

## 2015-02-10 ENCOUNTER — Emergency Department (HOSPITAL_COMMUNITY): Payer: Medicare Other

## 2015-02-10 ENCOUNTER — Encounter (HOSPITAL_COMMUNITY): Payer: Self-pay | Admitting: *Deleted

## 2015-02-10 ENCOUNTER — Observation Stay (HOSPITAL_COMMUNITY)
Admission: EM | Admit: 2015-02-10 | Discharge: 2015-02-14 | Disposition: A | Discharge status: Home / Self Care | Payer: Medicare Other | Attending: Cardiovascular Disease | Admitting: Cardiovascular Disease

## 2015-02-10 DIAGNOSIS — D472 Monoclonal gammopathy: Secondary | ICD-10-CM | POA: Diagnosis present

## 2015-02-10 DIAGNOSIS — I129 Hypertensive chronic kidney disease with stage 1 through stage 4 chronic kidney disease, or unspecified chronic kidney disease: Secondary | ICD-10-CM | POA: Insufficient documentation

## 2015-02-10 DIAGNOSIS — E785 Hyperlipidemia, unspecified: Secondary | ICD-10-CM | POA: Diagnosis present

## 2015-02-10 DIAGNOSIS — N189 Chronic kidney disease, unspecified: Secondary | ICD-10-CM | POA: Diagnosis not present

## 2015-02-10 DIAGNOSIS — I5032 Chronic diastolic (congestive) heart failure: Secondary | ICD-10-CM

## 2015-02-10 DIAGNOSIS — I480 Paroxysmal atrial fibrillation: Secondary | ICD-10-CM

## 2015-02-10 DIAGNOSIS — R079 Chest pain, unspecified: Secondary | ICD-10-CM | POA: Diagnosis not present

## 2015-02-10 DIAGNOSIS — I5030 Unspecified diastolic (congestive) heart failure: Secondary | ICD-10-CM | POA: Diagnosis not present

## 2015-02-10 DIAGNOSIS — R001 Bradycardia, unspecified: Secondary | ICD-10-CM | POA: Diagnosis present

## 2015-02-10 DIAGNOSIS — R42 Dizziness and giddiness: Secondary | ICD-10-CM | POA: Diagnosis not present

## 2015-02-10 DIAGNOSIS — N184 Chronic kidney disease, stage 4 (severe): Secondary | ICD-10-CM

## 2015-02-10 DIAGNOSIS — I509 Heart failure, unspecified: Secondary | ICD-10-CM

## 2015-02-10 DIAGNOSIS — R55 Syncope and collapse: Secondary | ICD-10-CM | POA: Diagnosis not present

## 2015-02-10 DIAGNOSIS — I10 Essential (primary) hypertension: Secondary | ICD-10-CM | POA: Diagnosis present

## 2015-02-10 DIAGNOSIS — R51 Headache: Secondary | ICD-10-CM | POA: Insufficient documentation

## 2015-02-10 DIAGNOSIS — D649 Anemia, unspecified: Secondary | ICD-10-CM | POA: Diagnosis present

## 2015-02-10 DIAGNOSIS — I4891 Unspecified atrial fibrillation: Secondary | ICD-10-CM | POA: Diagnosis present

## 2015-02-10 DIAGNOSIS — E039 Hypothyroidism, unspecified: Secondary | ICD-10-CM | POA: Diagnosis present

## 2015-02-10 LAB — CBC WITH DIFFERENTIAL/PLATELET
Basophils Absolute: 0 10*3/uL (ref 0.0–0.1)
Basophils Relative: 1 % (ref 0–1)
Eosinophils Absolute: 0.1 10*3/uL (ref 0.0–0.7)
Eosinophils Relative: 3 % (ref 0–5)
HCT: 36.9 % (ref 36.0–46.0)
Hemoglobin: 11.6 g/dL — ABNORMAL LOW (ref 12.0–15.0)
Lymphocytes Relative: 19 % (ref 12–46)
Lymphs Abs: 0.9 10*3/uL (ref 0.7–4.0)
MCH: 31.4 pg (ref 26.0–34.0)
MCHC: 31.4 g/dL (ref 30.0–36.0)
MCV: 100 fL (ref 78.0–100.0)
Monocytes Absolute: 0.3 10*3/uL (ref 0.1–1.0)
Monocytes Relative: 7 % (ref 3–12)
Neutro Abs: 3.1 10*3/uL (ref 1.7–7.7)
Neutrophils Relative %: 70 % (ref 43–77)
Platelets: 120 10*3/uL — ABNORMAL LOW (ref 150–400)
RBC: 3.69 MIL/uL — ABNORMAL LOW (ref 3.87–5.11)
RDW: 14.1 % (ref 11.5–15.5)
WBC: 4.4 10*3/uL (ref 4.0–10.5)

## 2015-02-10 LAB — BASIC METABOLIC PANEL
Anion gap: 5 (ref 5–15)
BUN: 42 mg/dL — ABNORMAL HIGH (ref 6–23)
CO2: 29 mmol/L (ref 19–32)
Calcium: 9.5 mg/dL (ref 8.4–10.5)
Chloride: 106 mmol/L (ref 96–112)
Creatinine, Ser: 2.37 mg/dL — ABNORMAL HIGH (ref 0.50–1.10)
GFR calc Af Amer: 20 mL/min — ABNORMAL LOW (ref >90)
GFR calc non Af Amer: 17 mL/min — ABNORMAL LOW (ref >90)
Glucose, Bld: 109 mg/dL — ABNORMAL HIGH (ref 70–99)
Potassium: 4.5 mmol/L (ref 3.5–5.1)
Sodium: 140 mmol/L (ref 135–145)

## 2015-02-10 LAB — URINALYSIS, ROUTINE W REFLEX MICROSCOPIC
Bilirubin Urine: NEGATIVE
Glucose, UA: NEGATIVE mg/dL
Hgb urine dipstick: NEGATIVE
Ketones, ur: NEGATIVE mg/dL
Nitrite: NEGATIVE
Protein, ur: NEGATIVE mg/dL
Specific Gravity, Urine: 1.012 (ref 1.005–1.030)
Urobilinogen, UA: 1 mg/dL (ref 0.0–1.0)
pH: 6.5 (ref 5.0–8.0)

## 2015-02-10 LAB — TROPONIN I
Troponin I: 0.05 ng/mL — ABNORMAL HIGH (ref <0.031)
Troponin I: 0.06 ng/mL — ABNORMAL HIGH (ref <0.031)

## 2015-02-10 LAB — URINE MICROSCOPIC-ADD ON

## 2015-02-10 LAB — BRAIN NATRIURETIC PEPTIDE: B NATRIURETIC PEPTIDE 5: 535.6 pg/mL — AB (ref 0.0–100.0)

## 2015-02-10 MED ORDER — ACETAMINOPHEN 325 MG PO TABS
650.0000 mg | ORAL_TABLET | Freq: Once | ORAL | Status: AC
  Administered 2015-02-10: 650 mg via ORAL

## 2015-02-10 MED ORDER — AMIODARONE HCL 200 MG PO TABS
200.0000 mg | ORAL_TABLET | Freq: Every day | ORAL | Status: DC
  Administered 2015-02-10 – 2015-02-14 (×5): 200 mg via ORAL
  Filled 2015-02-10 (×5): qty 1

## 2015-02-10 MED ORDER — ACETAMINOPHEN 325 MG PO TABS
650.0000 mg | ORAL_TABLET | ORAL | Status: DC | PRN
  Administered 2015-02-11 – 2015-02-14 (×5): 650 mg via ORAL
  Filled 2015-02-10 (×5): qty 2

## 2015-02-10 MED ORDER — LEVOTHYROXINE SODIUM 75 MCG PO TABS
75.0000 ug | ORAL_TABLET | Freq: Every day | ORAL | Status: DC
  Administered 2015-02-11 – 2015-02-14 (×4): 75 ug via ORAL
  Filled 2015-02-10 (×5): qty 1

## 2015-02-10 MED ORDER — ZOLPIDEM TARTRATE 5 MG PO TABS: 5.0000 mg | ORAL_TABLET | Freq: Every evening | ORAL | Status: DC | PRN

## 2015-02-10 MED ORDER — LORATADINE 10 MG PO TABS
10.0000 mg | ORAL_TABLET | Freq: Every day | ORAL | Status: DC | PRN
  Administered 2015-02-11 – 2015-02-13 (×3): 10 mg via ORAL
  Filled 2015-02-10 (×4): qty 1

## 2015-02-10 MED ORDER — TORSEMIDE 20 MG PO TABS: 40.0000 mg | ORAL_TABLET | Freq: Two times a day (BID) | ORAL | Status: DC

## 2015-02-10 MED ORDER — METOPROLOL TARTRATE 25 MG PO TABS
25.0000 mg | ORAL_TABLET | Freq: Two times a day (BID) | ORAL | Status: DC
  Filled 2015-02-10: qty 1

## 2015-02-10 MED ORDER — ALPRAZOLAM 0.25 MG PO TABS
0.2500 mg | ORAL_TABLET | Freq: Two times a day (BID) | ORAL | Status: DC | PRN
  Administered 2015-02-11: 0.25 mg via ORAL
  Filled 2015-02-10: qty 1

## 2015-02-10 MED ORDER — APIXABAN 2.5 MG PO TABS
2.5000 mg | ORAL_TABLET | Freq: Two times a day (BID) | ORAL | Status: DC
  Administered 2015-02-10 – 2015-02-14 (×8): 2.5 mg via ORAL
  Filled 2015-02-10 (×9): qty 1

## 2015-02-10 MED ORDER — POTASSIUM CHLORIDE CRYS ER 20 MEQ PO TBCR
40.0000 meq | EXTENDED_RELEASE_TABLET | Freq: Two times a day (BID) | ORAL | Status: DC
  Filled 2015-02-10: qty 2

## 2015-02-10 MED ORDER — POTASSIUM CHLORIDE CRYS ER 20 MEQ PO TBCR: 40.0000 meq | EXTENDED_RELEASE_TABLET | Freq: Two times a day (BID) | ORAL | Status: DC

## 2015-02-10 MED ORDER — FERROUS SULFATE 325 (65 FE) MG PO TABS
325.0000 mg | ORAL_TABLET | Freq: Two times a day (BID) | ORAL | Status: DC
  Administered 2015-02-11 – 2015-02-14 (×7): 325 mg via ORAL
  Filled 2015-02-10 (×8): qty 1

## 2015-02-10 MED ORDER — ENOXAPARIN SODIUM 40 MG/0.4ML ~~LOC~~ SOLN: 40.0000 mg | SUBCUTANEOUS | Status: DC

## 2015-02-10 MED ORDER — SIMVASTATIN 10 MG PO TABS
10.0000 mg | ORAL_TABLET | Freq: Every day | ORAL | Status: DC
  Administered 2015-02-11 – 2015-02-13 (×3): 10 mg via ORAL
  Filled 2015-02-10 (×3): qty 1

## 2015-02-10 MED ORDER — ONDANSETRON HCL 4 MG/2ML IJ SOLN
4.0000 mg | Freq: Four times a day (QID) | INTRAMUSCULAR | Status: DC | PRN
  Administered 2015-02-11: 4 mg via INTRAVENOUS
  Filled 2015-02-10: qty 2

## 2015-02-10 MED ORDER — SODIUM CHLORIDE 0.9 % IV BOLUS (SEPSIS)
250.0000 mL | Freq: Once | INTRAVENOUS | Status: AC
  Administered 2015-02-10: 250 mL via INTRAVENOUS

## 2015-02-10 MED ORDER — ACETAMINOPHEN 325 MG PO TABS
325.0000 mg | ORAL_TABLET | Freq: Once | ORAL | Status: DC
  Filled 2015-02-10: qty 1

## 2015-02-10 NOTE — ED Notes (Addendum)
Pt states she is seeing the wall move and is having a h/a and eye pain. Dr. Doy Mince informed.

## 2015-02-10 NOTE — ED Notes (Signed)
Phlebotomy at the bedside  

## 2015-02-10 NOTE — ED Notes (Signed)
Pt is ineligible for MRI, CT non contrast to be ordered instead.

## 2015-02-10 NOTE — ED Notes (Signed)
Escorted to [facility] by Quillian Quince

## 2015-02-10 NOTE — ED Notes (Signed)
Cards at bedside

## 2015-02-10 NOTE — ED Provider Notes (Signed)
ED ECG REPORT   Date: 02/10/2015  Rate: 87  Rhythm: paced rhythm  QRS Axis: indeterminate  Intervals: paced rhythm  ST/T Wave abnormalities: indeterminate  Conduction Disutrbances:left bundle branch block  Narrative Interpretation:   Old EKG Reviewed: unchanged  I have personally reviewed the EKG tracing and agree with the computerized printout as noted.  Alfonzo Beers, MD 02/10/15 1110

## 2015-02-10 NOTE — ED Notes (Signed)
Pt arrives from home via [facility]. Pt states she began having a heaviness in her chest yesterday and felt extremely dizzy and was holding onto furniture in order to ambulate through her home. Pt states the chest pressure comes and goes and the dizziness does as well. Pt received 324 of aspirin and 1 nitro SL pta. Nitro helped relieve cp, but dropped bp from 130/70 to 95/51. EMS didn't administer any following doses. Pt has a demand pacer with a  Hx of chf and hypotension. Pt is currently taking eliquis rt hx of a fib.

## 2015-02-10 NOTE — ED Provider Notes (Signed)
CSN: [id number]     Arrival date & time 02/10/15  1024 History   First MD Initiated Contact with Patient 02/10/15 1033     Chief Complaint  Patient presents with  . Chest Pain  . Dizziness     (Consider location/radiation/quality/duration/timing/severity/associated sxs/prior Treatment) HPI   The patient is a 81 y/o female with history of CHF, AF, CAD, and hypotension who has had multiple heart catheterizations, multiple cardioversions, and a pacemaker who presents to the emergency department today with dizziness and chest tightness. This began with dizziness last night, described as lightheadedness. Around 3 am this morning, she was woken by a tightness in her chest and felt severely lightheaded and imbalanced when she got up to go to the bathroom. She had to use furniture as support. The dizziness is made worse by standing up and leaning forward, the chest pressure made worse by standing up and walking. The chest tightness comes and goes and lasts less than one minute at a time. It is located throughout the chest. It does not radiate to her back. She was also slightly short of breath when walking, but states that she checked her oxygen saturation level and it stayed between 93-97%. She also endorses an intermittent headache and neck pain. These come less frequently than the chest tightness and are located above the eyes and at the back of the neck. She denies fevers, chills, diaphoresis, loss of vision, blurry vision, vertigo, numbness/tingling, weakness, nausea, vomiting, diarrhea, constipation, incontinence, leg swelling. She does endorse eye pain and thinks she may have scratched her eye.   The patient denies tobacco, alcohol, and other substance use. She used to drink 1 glass of wine at night, but stopped when she started taking multiple medications.    Past Medical History  Diagnosis Date  . Dyslipidemia   . Hypertension   . Palpitations     PVCs/bigeminy on event in May 2009 revealing  this was relatively asymptomatic  . Chest pain     a. 2011 Neg MV;  b. 01/2014 Cath: LM 20-30, LAD nl, D1 nl, LCX nl, OM1 nl, RCA dom 30-31m, PD/PL nl, EF 65%->Med Rx.  . Degenerative joint disease   . Hx of varicella   . History of skin cancer     tafeen/ non melanoma.   . Thrombocytopenia   . Anemia   . Monoclonal gammopathies   . Right lower quadrant abdominal pain 07/19/2012    Recurrent  With nausea    This is new since she had ct for llq pain in [facility]    R/o appendiceal problem  Hernia  Get ct scan  And plan  Fu     . Diastolic CHF, acute on chronic 01/31/2012  . Atrial fibrillation     a. Dx 01/2014->Eliquis started.  . Cancer   . Pacemaker   . Dysrhythmia    Past Surgical History  Procedure Laterality Date  . Cholecystectomy  1999  . Cesarean section      times 2  . Shoulder surgery  1996    Right   . Cataract extraction      Bilateral  implantt  . Cardioversion N/A 02/26/2014    Procedure: CARDIOVERSION AT BEDSIDE;  Surgeon: Pixie Casino, MD;  Location: [place];  Service: Cardiovascular;  Laterality: N/A;  . Cardioversion N/A 04/22/2014    Procedure: CARDIOVERSION (BEDSIDE);  Surgeon: [name], MD;  Location: [facility];  Service: Cardiovascular;  Laterality: N/A;  . Pacemaker insertion  04/23/2014  STJ Assurity dual chamber pacemaker implanted by Dr Rayann Heman  . Insert / replace / remove pacemaker    . Esophagogastroduodenoscopy N/A 07/06/2014    Procedure: ESOPHAGOGASTRODUODENOSCOPY (EGD);  Surgeon: Ladene Artist, MD;  Location: [facility] ENDOSCOPY;  Service: Endoscopy;  Laterality: N/A;  . Left heart catheterization with coronary angiogram N/A 01/29/2014    Procedure: LEFT HEART CATHETERIZATION WITH CORONARY ANGIOGRAM;  Surgeon: Blane Ohara, MD;  Location: [facility] CATH LAB;  Service: Cardiovascular;  Laterality: N/A;  . Permanent pacemaker insertion N/A 04/23/2014    Procedure: PERMANENT PACEMAKER INSERTION;  Surgeon: Evans Lance, MD;  Location: [facility] CATH LAB;  Service:  Cardiovascular;  Laterality: N/A;  . Right heart catheterization N/A 09/22/2014    Procedure: RIGHT HEART CATH;  Surgeon: Jolaine Artist, MD;  Location: [facility] CATH LAB;  Service: Cardiovascular;  Laterality: N/A;   Family History  Problem Relation Age of Onset  . Coronary artery disease Father   . Heart disease Father   . Lung cancer    . Alzheimer's disease Mother   . Cancer Brother 103    lung cancer  . Heart attack Father    History  Substance Use Topics  . Smoking status: Never Smoker   . Smokeless tobacco: Never Used  . Alcohol Use: No   OB History    No data available     Review of Systems  All other systems negative except as documented in the HPI. All pertinent positives and negatives as reviewed in the HPI.   Allergies  Tramadol  Home Medications   Prior to Admission medications   Medication Sig Start Date End Date Taking? Authorizing Provider  acetaminophen (TYLENOL) 325 MG tablet Take 2 tablets (650 mg total) by mouth every 4 (four) hours as needed for headache or mild pain. 09/26/14  Yes Isaiah Serge, NP  amiodarone (PACERONE) 200 MG tablet Take 1 tablet (200 mg total) by mouth daily. 11/06/14  Yes Jolaine Artist, MD  apixaban (ELIQUIS) 2.5 MG TABS tablet Take 1 tablet (2.5 mg total) by mouth 2 (two) times daily. 12/30/14  Yes Jolaine Artist, MD  ferrous sulfate 325 (65 FE) MG tablet Take 1 tablet (325 mg total) by mouth 2 (two) times daily with a meal. 11/21/14  Yes Burnis Medin, MD  levothyroxine (SYNTHROID, LEVOTHROID) 75 MCG tablet Take 1 tablet (75 mcg total) by mouth daily. 11/21/14  Yes Burnis Medin, MD  loratadine (CLARITIN) 10 MG tablet Take 10 mg by mouth daily as needed for allergies.    Yes Historical Provider, MD  metolazone (ZAROXOLYN) 2.5 MG tablet Take 1 tablet (2.5 mg total) by mouth as needed (for weight 136 lb or greater). 12/16/14  Yes Jolaine Artist, MD  metoprolol tartrate (LOPRESSOR) 25 MG tablet Take 25 mg by mouth 2 (two) times  daily.  11/20/14  Yes Historical Provider, MD  nitroGLYCERIN (NITROSTAT) 0.4 MG SL tablet Place 1 tablet (0.4 mg total) under the tongue every 5 (five) minutes as needed. For chest pain 12/02/13  Yes Fay Records, MD  potassium chloride SA (KLOR-CON M20) 20 MEQ tablet Take 2 tablets (40 mEq total) by mouth 2 (two) times daily. 10/30/14  Yes Almyra Deforest, PA  simvastatin (ZOCOR) 10 MG tablet Take 1 tablet (10 mg total) by mouth at bedtime. 11/06/14  Yes Jolaine Artist, MD  torsemide (DEMADEX) 20 MG tablet Take 2 tablets (40 mg total) by mouth 2 (two) times daily. 12/10/14  Yes Brittainy Erie Noe,  PA-C  LORazepam (ATIVAN) 0.5 MG tablet Take 0.5-1 tablets (0.25-0.5 mg total) by mouth every 8 (eight) hours as needed for anxiety. 09/09/14   Burnis Medin, MD  pantoprazole (PROTONIX) 40 MG tablet Take 1 tablet (40 mg total) by mouth daily. 08/11/14   Burnis Medin, MD  senna (SENOKOT) 8.6 MG TABS tablet Take 2 tablets (17.2 mg total) by mouth daily as needed for mild constipation. Patient not taking: Reported on 01/12/2015 09/26/14   Isaiah Serge, NP   BP 113/44 mmHg  Pulse 62  Temp(Src) 98 F (36.7 C) (Oral)  Resp 18  Ht 4\' 11"  (1.499 m)  Wt 133 lb 4 oz (60.442 kg)  BMI 26.90 kg/m2  SpO2 98% Physical Exam  Constitutional: She is oriented to person, place, and time. She appears well-developed and well-nourished. No distress.  HENT:  Head: Normocephalic and atraumatic.  Right Ear: External ear normal.  Left Ear: External ear normal.  Mouth/Throat: Oropharynx is clear and moist.  Eyes: Pupils are unequal.  Right eye injected and erythematous; EOMs intact but slowed; left pupil 81mm, right pupil 60mm  Neck: Normal range of motion. Neck supple.  Cardiovascular: Normal rate and intact distal pulses.  Exam reveals gallop and S3. Exam reveals no friction rub.   Pulmonary/Chest: Effort normal and breath sounds normal. No respiratory distress. She has no wheezes. She has no rales. She exhibits no  tenderness.  Musculoskeletal: She exhibits no edema.  Neurological: She is alert and oriented to person, place, and time. She has normal strength. No cranial nerve deficit. She displays a negative Romberg sign. Coordination normal.  Skin: Skin is warm and dry. No rash noted. She is not diaphoretic. No erythema.  Psychiatric: She has a normal mood and affect.    ED Course  Procedures (including critical care time) Labs Review Labs Reviewed  BASIC METABOLIC PANEL - Abnormal; Notable for the following:    Glucose, Bld 109 (*)    BUN 42 (*)    Creatinine, Ser 2.37 (*)    GFR calc non Af Amer 17 (*)    GFR calc Af Amer 20 (*)    All other components within normal limits  CBC WITH DIFFERENTIAL/PLATELET - Abnormal; Notable for the following:    RBC 3.69 (*)    Hemoglobin 11.6 (*)    Platelets 120 (*)    All other components within normal limits  TROPONIN I - Abnormal; Notable for the following:    Troponin I 0.05 (*)    All other components within normal limits  URINALYSIS, ROUTINE W REFLEX MICROSCOPIC - Abnormal; Notable for the following:    Leukocytes, UA TRACE (*)    All other components within normal limits  URINE MICROSCOPIC-ADD ON - Abnormal; Notable for the following:    Squamous Epithelial / LPF FEW (*)    Casts HYALINE CASTS (*)    All other components within normal limits  BRAIN NATRIURETIC PEPTIDE - Abnormal; Notable for the following:    B Natriuretic Peptide 535.6 (*)    All other components within normal limits  TROPONIN I - Abnormal; Notable for the following:    Troponin I 0.06 (*)    All other components within normal limits  TROPONIN I - Abnormal; Notable for the following:    Troponin I 0.05 (*)    All other components within normal limits  CBC - Abnormal; Notable for the following:    WBC 3.6 (*)    RBC 3.32 (*)  Hemoglobin 10.8 (*)    HCT 33.5 (*)    MCV 100.9 (*)    Platelets 105 (*)    All other components within normal limits  COMPREHENSIVE  METABOLIC PANEL - Abnormal; Notable for the following:    Glucose, Bld 102 (*)    BUN 36 (*)    Creatinine, Ser 2.22 (*)    Total Protein 5.3 (*)    Albumin 2.8 (*)    GFR calc non Af Amer 19 (*)    GFR calc Af Amer 22 (*)    All other components within normal limits  TROPONIN I - Abnormal; Notable for the following:    Troponin I 0.06 (*)    All other components within normal limits  BASIC METABOLIC PANEL - Abnormal; Notable for the following:    Potassium 5.5 (*)    BUN 28 (*)    Creatinine, Ser 1.97 (*)    GFR calc non Af Amer 22 (*)    GFR calc Af Amer 25 (*)    All other components within normal limits  CBC - Abnormal; Notable for the following:    RBC 3.36 (*)    Hemoglobin 10.8 (*)    HCT 34.3 (*)    MCV 102.1 (*)    Platelets 112 (*)    All other components within normal limits  BASIC METABOLIC PANEL - Abnormal; Notable for the following:    Potassium 5.7 (*)    Glucose, Bld 150 (*)    BUN 25 (*)    Creatinine, Ser 1.96 (*)    GFR calc non Af Amer 22 (*)    GFR calc Af Amer 25 (*)    All other components within normal limits  POTASSIUM - Abnormal; Notable for the following:    Potassium 5.4 (*)    All other components within normal limits  BRAIN NATRIURETIC PEPTIDE - Abnormal; Notable for the following:    B Natriuretic Peptide 377.9 (*)    All other components within normal limits  BASIC METABOLIC PANEL - Abnormal; Notable for the following:    BUN 24 (*)    Creatinine, Ser 1.91 (*)    GFR calc non Af Amer 22 (*)    GFR calc Af Amer 26 (*)    Anion gap 4 (*)    All other components within normal limits  BASIC METABOLIC PANEL - Abnormal; Notable for the following:    BUN 28 (*)    Creatinine, Ser 2.01 (*)    GFR calc non Af Amer 21 (*)    GFR calc Af Amer 25 (*)    Anion gap 4 (*)    All other components within normal limits  CULTURE, BLOOD (ROUTINE X 2)  CULTURE, BLOOD (ROUTINE X 2)  MRSA PCR SCREENING  LACTIC ACID, PLASMA  LIPASE, BLOOD  TSH  ACTH  STIMULATION, 3 TIME POINTS    Imaging Review No results found.   EKG Interpretation   Date/Time:  Tuesday February 10 2015 10:35:11 EDT Ventricular Rate:  82 PR Interval:  170 QRS Duration: 154 QT Interval:  508 QTC Calculation: 593 R Axis:   113 Text Interpretation:  A-V dual-paced complexes w/ some inhibition No  further analysis attempted due to paced rhythm No significant change since  last tracing Confirmed by [facility]  MD, MARTHA [phone]) on 02/10/2015 3:33:26  PM      The patient will need to be admitted to the hospital.    Dalia Heading, PA-C 02/16/15 2046  Jana Half  Canary Brim, MD 02/18/15 [phone]

## 2015-02-10 NOTE — ED Notes (Signed)
Called dinner tray for patient as requested.

## 2015-02-10 NOTE — ED Notes (Signed)
To CT transported by [facility].

## 2015-02-10 NOTE — Consult Note (Signed)
CARDIOLOGY CONSULT NOTE   Patient ID: [name] MRN: [id number] DOB/AGE: [birth year] 79 y.o.  Admit date: 02/10/2015  Primary Physician   [name], MD Primary Cardiologist   Dr Harrington Challenger CHF MD: Dr Haroldine Laws Reason for Consultation: Chest pain and dizziness   [id number] [name] is Linda 81 y.o. year old female with Linda history of 81 y.o. female with the history of nonobstructive CAD by cardiac catheterization in 01/2014, persistent atrial fibrillation, diastolic CHF, HTN, HL, anemia, and CKD.  Last seen in the CHF clinic 03/16 in followup. Weight 137 lbs, euvolemic and told to continue torsemide 40 mg bid and metolazone prn for weight gain. She was not having any palpitations, to continue amio and Eliquis. Renal function was stable.  She states she is compliant with medications, tries to eat Linda low-sodium diet and has not had to take the metolazone in several weeks.  She had problems being light-headed when she gets up last pm after supper. During the night, this was more severe, and she had trouble walking. This am, she was having symptoms sitting still, that would get worse with ambulation. She did not fall or pass out.   She came by EMS to the ER. She has had Linda HA today, it has lasted all day. She had brief pin-prick chest pain, but this was brief and resolved spontaneously. In the ER, she saw something on the wall start to move (not actually happening). This has never happened before. She also complains of right pain, and thinks that she scratched it and trying to get to sleep out of her eyes.  Past Medical History  Diagnosis Date  . Dyslipidemia   . Hypertension   . Palpitations     PVCs/bigeminy on event in May 2009 revealing this was relatively asymptomatic  . Chest pain     Linda. 2011 Neg MV;  b. 01/2014 Cath: LM 20-30, LAD nl, D1 nl, LCX nl, OM1 nl, RCA dom 30-72m, PD/PL nl, EF 65%->Med Rx.  . Degenerative joint disease   . Hx of varicella   . History of skin  cancer     tafeen/ non melanoma.   . Thrombocytopenia   . Anemia   . Monoclonal gammopathies   . Right lower quadrant abdominal pain 07/19/2012    Recurrent  With nausea    This is new since she had ct for llq pain in [facility]    R/o appendiceal problem  Hernia  Get ct scan  And plan  Fu     . Diastolic CHF, acute on chronic 01/31/2012  . Atrial fibrillation     Linda. Dx 01/2014->Eliquis started.  . Cancer   . Pacemaker   . Dysrhythmia     Past Surgical History  Procedure Laterality Date  . Cholecystectomy  1999  . Cesarean section      times 2  . Shoulder surgery  1996    Right   . Cataract extraction      Bilateral  implantt  . Cardioversion N/Linda 02/26/2014    Procedure: CARDIOVERSION AT BEDSIDE;  Surgeon: Pixie Casino, MD;  Location: [facility];  Service: Cardiovascular;  Laterality: N/Linda;  . Cardioversion N/Linda 04/22/2014    Procedure: CARDIOVERSION (BEDSIDE);  Surgeon: [name], MD;  Location: [facility] OR;  Service: Cardiovascular;  Laterality: N/Linda;  . Pacemaker insertion  04/23/2014    STJ Assurity dual chamber pacemaker implanted by Dr Rayann Heman  . Insert / replace / remove pacemaker    .  Esophagogastroduodenoscopy N/Linda 07/06/2014    Procedure: ESOPHAGOGASTRODUODENOSCOPY (EGD);  Surgeon: Ladene Artist, MD;  Location: [facility] ENDOSCOPY;  Service: Endoscopy;  Laterality: N/Linda;  . Left heart catheterization with coronary angiogram N/Linda 01/29/2014    Procedure: LEFT HEART CATHETERIZATION WITH CORONARY ANGIOGRAM;  Surgeon: Blane Ohara, MD;  Location: [facility] CATH LAB;  Service: Cardiovascular;  Laterality: N/Linda;  . Permanent pacemaker insertion N/Linda 04/23/2014    Procedure: PERMANENT PACEMAKER INSERTION;  Surgeon: Evans Lance, MD;  Location: [facility] CATH LAB;  Service: Cardiovascular;  Laterality: N/Linda;  . Right heart catheterization N/Linda 09/22/2014    Procedure: RIGHT HEART CATH;  Surgeon: Jolaine Artist, MD;  Location: [facility] CATH LAB;  Service: Cardiovascular;  Laterality: N/Linda;    Allergies  Allergen  Reactions  . Tramadol Nausea Only    I have reviewed the patient's current medications Medication Sig  acetaminophen (TYLENOL) 325 MG tablet Take 2 tablets (650 mg total) by mouth every 4 (four) hours as needed for headache or mild pain.  amiodarone (PACERONE) 200 MG tablet Take 1 tablet (200 mg total) by mouth daily.  apixaban (ELIQUIS) 2.5 MG TABS tablet Take 1 tablet (2.5 mg total) by mouth 2 (two) times daily.  ferrous sulfate 325 (65 FE) MG tablet Take 1 tablet (325 mg total) by mouth 2 (two) times daily with Linda meal.  levothyroxine (SYNTHROID, LEVOTHROID) 75 MCG tablet Take 1 tablet (75 mcg total) by mouth daily.  loratadine (CLARITIN) 10 MG tablet Take 10 mg by mouth daily as needed for allergies.   metolazone (ZAROXOLYN) 2.5 MG tablet Take 1 tablet (2.5 mg total) by mouth as needed (for weight 136 lb or greater).  metoprolol tartrate (LOPRESSOR) 25 MG tablet Take 25 mg by mouth 2 (two) times daily.   nitroGLYCERIN (NITROSTAT) 0.4 MG SL tablet Place 1 tablet (0.4 mg total) under the tongue every 5 (five) minutes as needed. For chest pain  potassium chloride SA (KLOR-CON M20) 20 MEQ tablet Take 2 tablets (40 mEq total) by mouth 2 (two) times daily.  simvastatin (ZOCOR) 10 MG tablet Take 1 tablet (10 mg total) by mouth at bedtime.  torsemide (DEMADEX) 20 MG tablet Take 2 tablets (40 mg total) by mouth 2 (two) times daily.  LORazepam (ATIVAN) 0.5 MG tablet Take 0.5-1 tablets (0.25-0.5 mg total) by mouth every 8 (eight) hours as needed for anxiety.  pantoprazole (PROTONIX) 40 MG tablet Take 1 tablet (40 mg total) by mouth daily.  senna (SENOKOT) 8.6 MG TABS tablet Take 2 tablets (17.2 mg total) by mouth daily as needed for mild constipation. Patient not taking: Reported on 01/12/2015     History   Social History  . Marital Status: Widowed    Spouse Name: N/Linda  . Number of Children: 3  . Years of Education: N/Linda   Occupational History  .      [employer], book keeping   Social  History Main Topics  . Smoking status: Never Smoker   . Smokeless tobacco: Never Used  . Alcohol Use: No  . Drug Use: No  . Sexual Activity: No   Other Topics Concern  . 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 Eats with her.      Daughter handles medications. Sees HH and PT once Linda week.    Family Status  Relation Status Death Age  . Father Deceased 77    died of heart attack  . Mother Deceased 74    Cardiomegaly, died from  [place]  . Brother Deceased    Family History  Problem Relation Age of Onset  . Coronary artery disease Father   . Heart disease Father   . Lung cancer    . Alzheimer's disease Mother   . Cancer Brother 17    lung cancer  . Heart attack Father      ROS:  Full 14 point review of systems complete and found to be negative unless listed above.  Physical Exam: Blood pressure 80/49, pulse 61, temperature 98 F (36.7 C), temperature source Oral, resp. rate 19, height 4\' 11"  (1.499 m), weight 133 lb 4 oz (60.442 kg), SpO2 94 %.  General: Well developed, well nourished, female in no acute distress Head: Eyes PERRLA, No xanthomas.   Normocephalic and atraumatic, oropharynx without edema or exudate. Dentition: poor Lungs: few rales but generally clear Heart: HRRR S1 S2, no rub/gallop, murmur. pulses are 2+ all 4 extrem.   Neck: No carotid bruits. No lymphadenopathy.  JVD not elevated. Abdomen: Bowel sounds present, abdomen soft and non-tender without masses or hernias noted. Msk:  No spine or cva tenderness. No weakness, no joint deformities or effusions. Extremities: No clubbing or cyanosis. No edema.  Neuro: Alert and oriented X 3. No focal deficits noted. Psych:  Good affect, responds appropriately Skin: No rashes or  lesions noted.  Labs:   Lab Results  Component Value Date   WBC 4.4 02/10/2015   HGB 11.6* 02/10/2015   HCT 36.9 02/10/2015   MCV 100.0 02/10/2015   PLT 120* 02/10/2015     Recent Labs Lab 02/10/15 1210  NA 140  K 4.5  CL 106  CO2 29  BUN 42*  CREATININE 2.37*  CALCIUM 9.5  GLUCOSE 109*    Recent Labs  02/10/15 1210  TROPONINI 0.05*   Echo: 02/2014 Study Conclusions - Left ventricle: The cavity size was normal. Wall thickness was increased in Linda pattern of mild LVH. There was focal basal hypertrophy. Systolic function was normal. The estimated ejection fraction was in the range of 60% to 65%. Wall motion was normal; there were no regional wall motion abnormalities. - Left atrium: The atrium was mildly dilated. - Right atrium: The atrium was mildly dilated. - Pericardium, extracardiac: Linda trivial pericardial effusion was identified.  ECG:  AV pacing plus PVCs  Radiology:  Dg Chest 2 View 02/10/2015   CLINICAL DATA:  Chest discomfort and dizziness  EXAM: CHEST  2 VIEW  COMPARISON:  01/12/2015  FINDINGS: Cardiac shadow is mildly prominent. Linda pacing device is again seen and stable. The lungs are well aerated bilaterally without focal infiltrate or sizable effusion. Diffuse aortic calcifications are noted. No acute bony abnormality is seen.  IMPRESSION: No acute abnormality noted.  No change from the previous exam.   Electronically Signed   By: Inez Catalina M.D.   On: 02/10/2015 16:15   Ct Head Wo Contrast 02/10/2015   CLINICAL DATA:  Dizziness and gait disturbance, acute  EXAM: CT HEAD WITHOUT CONTRAST  TECHNIQUE: Contiguous axial images were obtained from the base of the skull through the vertex without intravenous contrast.  COMPARISON:  January 12, 2015  FINDINGS: Age related volume loss is  stable. There is no intracranial mass, hemorrhage, extra-axial fluid collection, or midline shift. Mild small vessel disease in the centra semiovale bilaterally is stable.  There is no new gray-white compartment lesion. No acute appearing infarct apparent. Bony calvarium appears intact. The mastoid air cells are clear. Apparent cerumen in the right external auditory canal is unchanged.  IMPRESSION: Age related volume loss with mild supratentorial small vessel disease. No intracranial mass, hemorrhage, or acute appearing infarct. Probable cerumen in the right external auditory canal, stable. No appreciable change compared to recent prior study.   Electronically Signed   By: Lowella Grip III M.D.   On: 02/10/2015 15:38    ASSESSMENT AND PLAN:   The patient was seen today by Dr Claiborne Billings, the patient evaluated and the data reviewed.   Principal Problem:   Near syncope - Her blood pressures have ranged today from 80 systolic up to 828 systolic. - She said her weight today was 134.6 which is about Linda pound and Linda half below her normal weight. - She may be Linda little dry, we'll check orthostatic vital signs - For now, will not hydrate IV as she is mentating well and denies any symptoms at rest. We'll give her oral fluids and see how she does.    Chronic kidney disease - Her BUN and creatinine are similar to previous values. - Continue to follow closely    Troponin elevation - Her troponin is minimally elevated, and may be entirely secondary to her renal insufficiency or CHF.    Headache - Patient to get Tylenol from the ER    Right pain - Do not see any significant injury or swelling - Evaluation to ER  Signed: Rosaria Ferries, PA-C 02/10/2015 6:28 PM Beeper [phone]  Co-Sign MD   Patient seen and examined. Agree with assessment and plan.  [name] is Linda very pleasant 81 year old female originally from [place] who has history of atrial fibrillation, hypertension, diastolic heart failure, chronic kidney disease, hyperlipidemia, and is status post permanent pacemaker insertion.  She is followed by Dr. Haroldine Laws for CHF.  She has developed increasing  weakness.  She has been taking torsemide 40 mg twice Linda day and rarely takes metolazone as needed for additional weight gain.  She is on amiodarone for antiarrhythmic therapy and is on Eliquis for anticoagulation.  She has noticed some vague intermittent chest pressure.  Today she is experienced more dizziness.  She was given nitroglycerin earlier, which decreased her blood pressure from 130/70-95/51, ultimately 80/60.  Her ECG reveals an AV paced rhythm with occasional PVC's.  She currently is pain-free without chest pain.  She has chronic renal insufficiency and her creatinine today is 2.36 with Linda GFR of 17 consistent with stage IV chronic renal disease.  She underwent Linda right art catheterization in November 2015 which revealed PA pressure at 49/22 and palmar E wedge tracing at 18 with significant wheezing v waves in the wedge tracing.  At present, will monitor for orthostatic hypotension.  We will check Linda BNP and serial troponins.  Will notify Dr. Haroldine Laws and his team for CHF  team continuity.   Troy Sine, MD, [facility] 02/10/2015 7:17 PM

## 2015-02-10 NOTE — ED Notes (Signed)
Called service response to report room change for dinner, spoke to [place].

## 2015-02-11 DIAGNOSIS — I9589 Other hypotension: Secondary | ICD-10-CM

## 2015-02-11 DIAGNOSIS — J9811 Atelectasis: Secondary | ICD-10-CM | POA: Diagnosis not present

## 2015-02-11 DIAGNOSIS — R0902 Hypoxemia: Secondary | ICD-10-CM | POA: Diagnosis not present

## 2015-02-11 DIAGNOSIS — I959 Hypotension, unspecified: Secondary | ICD-10-CM

## 2015-02-11 DIAGNOSIS — R51 Headache: Secondary | ICD-10-CM | POA: Diagnosis not present

## 2015-02-11 DIAGNOSIS — I480 Paroxysmal atrial fibrillation: Secondary | ICD-10-CM | POA: Diagnosis not present

## 2015-02-11 DIAGNOSIS — R079 Chest pain, unspecified: Secondary | ICD-10-CM | POA: Diagnosis not present

## 2015-02-11 DIAGNOSIS — R55 Syncope and collapse: Secondary | ICD-10-CM | POA: Diagnosis not present

## 2015-02-11 DIAGNOSIS — I129 Hypertensive chronic kidney disease with stage 1 through stage 4 chronic kidney disease, or unspecified chronic kidney disease: Secondary | ICD-10-CM | POA: Diagnosis not present

## 2015-02-11 DIAGNOSIS — N189 Chronic kidney disease, unspecified: Secondary | ICD-10-CM | POA: Diagnosis not present

## 2015-02-11 DIAGNOSIS — I5032 Chronic diastolic (congestive) heart failure: Secondary | ICD-10-CM | POA: Diagnosis not present

## 2015-02-11 LAB — COMPREHENSIVE METABOLIC PANEL
ALK PHOS: 86 U/L (ref 39–117)
ALT: 17 U/L (ref 0–35)
AST: 21 U/L (ref 0–37)
Albumin: 2.8 g/dL — ABNORMAL LOW (ref 3.5–5.2)
Anion gap: 8 (ref 5–15)
BUN: 36 mg/dL — AB (ref 6–23)
CALCIUM: 9.3 mg/dL (ref 8.4–10.5)
CO2: 28 mmol/L (ref 19–32)
Chloride: 105 mmol/L (ref 96–112)
Creatinine, Ser: 2.22 mg/dL — ABNORMAL HIGH (ref 0.50–1.10)
GFR calc non Af Amer: 19 mL/min — ABNORMAL LOW (ref >90)
GFR, EST AFRICAN AMERICAN: 22 mL/min — AB (ref >90)
GLUCOSE: 102 mg/dL — AB (ref 70–99)
Potassium: 3.8 mmol/L (ref 3.5–5.1)
SODIUM: 141 mmol/L (ref 135–145)
Total Bilirubin: 0.5 mg/dL (ref 0.3–1.2)
Total Protein: 5.3 g/dL — ABNORMAL LOW (ref 6.0–8.3)

## 2015-02-11 LAB — CBC
HCT: 33.5 % — ABNORMAL LOW (ref 36.0–46.0)
HEMOGLOBIN: 10.8 g/dL — AB (ref 12.0–15.0)
MCH: 32.5 pg (ref 26.0–34.0)
MCHC: 32.2 g/dL (ref 30.0–36.0)
MCV: 100.9 fL — AB (ref 78.0–100.0)
PLATELETS: 105 10*3/uL — AB (ref 150–400)
RBC: 3.32 MIL/uL — ABNORMAL LOW (ref 3.87–5.11)
RDW: 14.2 % (ref 11.5–15.5)
WBC: 3.6 10*3/uL — ABNORMAL LOW (ref 4.0–10.5)

## 2015-02-11 LAB — LIPASE, BLOOD: Lipase: 38 U/L (ref 11–59)

## 2015-02-11 LAB — TSH: TSH: 3.588 u[IU]/mL (ref 0.350–4.500)

## 2015-02-11 LAB — TROPONIN I
Troponin I: 0.05 ng/mL — ABNORMAL HIGH (ref <0.031)
Troponin I: 0.06 ng/mL — ABNORMAL HIGH (ref <0.031)

## 2015-02-11 LAB — MRSA PCR SCREENING: MRSA by PCR: NEGATIVE

## 2015-02-11 LAB — LACTIC ACID, PLASMA: Lactic Acid, Venous: 1.1 mmol/L (ref 0.5–2.0)

## 2015-02-11 MED ORDER — SODIUM CHLORIDE 0.9 % IV BOLUS (SEPSIS)
250.0000 mL | Freq: Once | INTRAVENOUS | Status: AC
  Administered 2015-02-11: 250 mL via INTRAVENOUS

## 2015-02-11 MED ORDER — POTASSIUM CHLORIDE 20 MEQ/15ML (10%) PO SOLN
40.0000 meq | Freq: Two times a day (BID) | ORAL | Status: DC
Start: 2015-02-11 — End: 2015-02-12
  Administered 2015-02-11 – 2015-02-12 (×3): 40 meq via ORAL
  Filled 2015-02-11 (×5): qty 30

## 2015-02-11 MED ORDER — COSYNTROPIN 0.25 MG IJ SOLR
0.2500 mg | Freq: Once | INTRAMUSCULAR | Status: AC
  Administered 2015-02-11: 0.25 mg via INTRAVENOUS
  Filled 2015-02-11: qty 0.25

## 2015-02-11 NOTE — Progress Notes (Signed)
Nursing Note: Pt awake ,alert and feels fine,just states she that she is dizzy when she stands up Rapid  Response in to see pt and checked BP in l arm and was 84/56.Pt resting quietly in bed and waiting call from on-call.wbb

## 2015-02-11 NOTE — Progress Notes (Signed)
Advanced Heart Failure Rounding Note   Subjective:     Has received 1L NS over night. SBP now improved. Not orthostatic. Dry weight seems to be 131-135. Now 140. Creatinine stable at 2.2. Cortisol pending.   Objective:   Weight Range:  Vital Signs:   Temp:  [97.6 F (36.4 C)-97.9 F (36.6 C)] 97.6 F (36.4 C) (03/30 1201) Pulse Rate:  [52-96] 59 (03/30 1100) Resp:  [11-23] 15 (03/30 1100) BP: (73-130)/(18-65) 130/55 mmHg (03/30 1100) SpO2:  [91 %-100 %] 91 % (03/30 1100) Weight:  [60.44 kg (133 lb 3.9 oz)-63.504 kg (140 lb)] 63.504 kg (140 lb) (03/30 0407)    Weight change: Filed Weights   02/10/15 1038 02/10/15 2028 02/11/15 0407  Weight: 60.442 kg (133 lb 4 oz) 60.44 kg (133 lb 3.9 oz) 63.504 kg (140 lb)    Intake/Output:   Intake/Output Summary (Last 24 hours) at 02/11/15 1347 Last data filed at 02/11/15 1100  Gross per 24 hour  Intake    960 ml  Output    500 ml  Net    460 ml     Physical Exam: General: Elderly Frail appearing. No resp difficulty. Daughter present HEENT: normal except  Neck: supple. JVP 5-6. Carotids 2+ bilaterally; no bruits. No lymphadenopathy or thryomegaly appreciated. Cor: PMI normal. Regular rate & rhythm. No rubs, gallops or murmurs. Lungs: clear Abdomen: soft, nontender, nondistended. No hepatosplenomegaly. No bruits or masses. Good bowel sounds. Extremities: no cyanosis, clubbing, rash, R and LLE severe varicose veins no edema.  Neuro: alert & orientedx3, cranial nerves grossly intact. Moves all 4 extremities w/o difficulty. Affect pleasant.  Telemetry: AV paced 60  Labs: Basic Metabolic Panel:  Recent Labs Lab 02/10/15 1210 02/11/15 0650  NA 140 141  K 4.5 3.8  CL 106 105  CO2 29 28  GLUCOSE 109* 102*  BUN 42* 36*  CREATININE 2.37* 2.22*  CALCIUM 9.5 9.3    Liver Function Tests:  Recent Labs Lab 02/11/15 0650  AST 21  ALT 17  ALKPHOS 86  BILITOT 0.5  PROT 5.3*  ALBUMIN 2.8*    Recent Labs Lab  02/11/15 0650  LIPASE 38   No results for input(s): AMMONIA in the last 168 hours.  CBC:  Recent Labs Lab 02/10/15 1210 02/11/15 0650  WBC 4.4 3.6*  NEUTROABS 3.1  --   HGB 11.6* 10.8*  HCT 36.9 33.5*  MCV 100.0 100.9*  PLT 120* 105*    Cardiac Enzymes:  Recent Labs Lab 02/10/15 1210 02/10/15 1941 02/11/15 0123 02/11/15 0650  TROPONINI 0.05* 0.06* 0.05* 0.06*    BNP: BNP (last 3 results)  Recent Labs  12/06/14 0157 12/26/14 0554 02/10/15 1941  BNP 947.6* 282.1* 535.6*    ProBNP (last 3 results)  Recent Labs  08/27/14 1147 09/18/14 1317 10/25/14 0955  PROBNP 914.0* 1874.0* 13191.0*    ECG AV paced at 60  Other results:  Imaging: Dg Chest 2 View  02/10/2015   CLINICAL DATA:  Chest discomfort and dizziness  EXAM: CHEST  2 VIEW  COMPARISON:  01/12/2015  FINDINGS: Cardiac shadow is mildly prominent. A pacing device is again seen and stable. The lungs are well aerated bilaterally without focal infiltrate or sizable effusion. Diffuse aortic calcifications are noted. No acute bony abnormality is seen.  IMPRESSION: No acute abnormality noted.  No change from the previous exam.   Electronically Signed   By: Inez Catalina M.D.   On: 02/10/2015 16:15   Ct Head Wo Contrast  02/10/2015  CLINICAL DATA:  Dizziness and gait disturbance, acute  EXAM: CT HEAD WITHOUT CONTRAST  TECHNIQUE: Contiguous axial images were obtained from the base of the skull through the vertex without intravenous contrast.  COMPARISON:  January 12, 2015  FINDINGS: Age related volume loss is stable. There is no intracranial mass, hemorrhage, extra-axial fluid collection, or midline shift. Mild small vessel disease in the centra semiovale bilaterally is stable. There is no new gray-white compartment lesion. No acute appearing infarct apparent. Bony calvarium appears intact. The mastoid air cells are clear. Apparent cerumen in the right external auditory canal is unchanged.  IMPRESSION: Age related  volume loss with mild supratentorial small vessel disease. No intracranial mass, hemorrhage, or acute appearing infarct. Probable cerumen in the right external auditory canal, stable. No appreciable change compared to recent prior study.   Electronically Signed   By: Lowella Grip III M.D.   On: 02/10/2015 15:38      Medications:     Scheduled Medications: . amiodarone  200 mg Oral Daily  . apixaban  2.5 mg Oral BID  . ferrous sulfate  325 mg Oral BID WC  . levothyroxine  75 mcg Oral QAC breakfast  . potassium chloride  40 mEq Oral BID  . simvastatin  10 mg Oral q1800     Infusions:     PRN Medications:  acetaminophen, ALPRAZolam, loratadine, ondansetron (ZOFRAN) IV, zolpidem   Assessment:   1. Hypotension 2. Chronic diastolic HF 3. PAF with tachy brady - s/p PPM (STJ Assurity)     --maintainin NSR on amio  4. CKD, stage IV  Plan/Discussion:    Volume status looks good. Will continue to hold lasix. Place compression hose. Increase atrial rate to 70.  Can add midodrine as needed. Transfer to tele. PT/OT to see.   Length of Stay:    Glori Bickers  MD  02/11/2015, 1:47 PM  Advanced Heart Failure Team Pager [phone] (M-F; [place])  Please contact [facility] for night-coverage after hours (4p -7a ) and weekends on [email or web address]

## 2015-02-11 NOTE — Progress Notes (Signed)
Nursing Note: Spoke with [name] on-call and made aware of bp in l and r arm .wbb

## 2015-02-11 NOTE — Progress Notes (Signed)
Nursing Note: In to assess pt. BP 74/40 p-80.A: Paged on-call and Rapid Response paged.wbb

## 2015-02-11 NOTE — Progress Notes (Addendum)
On call cardiology note  Subjective: I have beencontacted several times over the course the evening by the patient's nurse regarding low blood pressures.  The patient is asymptomatic with the exception of occasional brief chest tightness lasting only a few seconds.  She denies shortness of breath and edema.  The patient denies fevers, chills, and dysuria.  She notes an occasional nonproductive cough.  Objective: Temp:  [97.8 F (36.6 C)-98 F (36.7 C)] 97.9 F (36.6 C) (03/30 0043) Pulse Rate:  [52-96] 96 (03/30 0043) Resp:  [11-25] 18 (03/30 0043) BP: (73-113)/(30-65) 77/31 mmHg (03/30 0308) SpO2:  [94 %-100 %] 97 % (03/30 0043) Weight:  [60.44 kg (133 lb 3.9 oz)-60.442 kg (133 lb 4 oz)] 60.44 kg (133 lb 3.9 oz) (03/29 2028)  Gen.: Elderly woman lying comfortably in bed Neck: No JVD Respiratory: Clear to auscultation bilaterally with good air movement. Cardiovascular: Regular rate and rhythm with 2/6 holosystolic murmur heard loudest at the left lower sternal border. Extremities: No lower extremity edema.  Tn 0.05 -> 0.06 ->0.05 BNP 536  WBC 4.4  Assessment/plan: 81 year old woman admitted with lightheadedness and found to have persistent hypotension.she has received 500 mL of normal saline over the last 6 hours with no sustained improvement in her hypotension.  She is not +810 mL since admission, though her urine output is incompletely measured.  I suspect that she is intravascularly volume depleted to some extent, though her pro BNP of 536 argues against this somewhat.  She is not clinically volume overloaded and is breathing comfortably on room air.  I will give her another 250 cc normal saline bolus and also obtain blood cultures in case there is an underlying infection.  Her urinalysis was relatively bland and her CBC demonstrated a normal white blood cell count, arguing against an infection.  I will hold the patient's metoprolol and torsemide for the time being.  If her hypotension  persists, we would need to consider moving her to a higher level of care for closer monitoring, though I would like to avoid this if possible given her lack of symptoms.  Nelva Bush, MD Cardiology Fellow  Addendum (02/11/15 @ 0535): Despite another 250 mL normal saline bolus, the patient has remained hypotensive.  She also now complains of mild shortness of breath with associated chest tightness.she appears clinically unchanged from my prior assessment.  EKG demonstrates AV pacing with no significant differences from prior tracing except for absence of PVCs on the current EKG.  Limited bedside echocardiogram demonstrates grossly normal biventricular systolic function, though the RV appears mildly to moderately enlarged.  There is thickening of the mitral and aortic valves.  Some degree of aortic stenosis is likely present, though I doubt it is severe based on mobility of the leaflets.no significant pericardial effusion is identified.  IVC is grossly normal in size but with decreased respiratory variation suggesting at least mildly elevated CVP.  Given the patient's persistent hypotension that has been refractory to fluid administration, I will transfer her to the ICU.  I will consult PCCM to assist with management of hypotension in the potential need for vasopressors with accompanying central venous and arterial access.  I will obtain a CBC, CMP, lactate, lipase, VBG, and troponin to evaluate for potential etiologies to explain her hypotension.  Blood cultures have just been drawn and I will have a low threshold for initiation of antibiotics.

## 2015-02-11 NOTE — Care Management Note (Addendum)
    Page 1 of 1   02/14/2015     2:41:47 PM CARE MANAGEMENT NOTE 02/14/2015  Patient:  [name], [name]   Account Number:  [id number]  Date Initiated:  02/11/2015  Documentation initiated by:  Elissa Hefty  Subjective/Objective Assessment:   adm w syncope     Action/Plan:   lives at home, pcp dr Shanon Ace   Anticipated DC Date:     Anticipated DC Plan:  [place]         [facility] Choice  Resumption Of Svcs/PTA Provider   Choice offered to / List presented to:          [facility] arranged  HH-1 RN  [place] agency  [place]   Status of service:   Medicare Important Message given?   (If response is "NO", the following Medicare IM given date fields will be blank) Date Medicare IM given:   Medicare IM given by:   Date Additional Medicare IM given:   Additional Medicare IM given by:    Discharge Disposition:  [place]  Per UR Regulation:  Reviewed for med. necessity/level of care/duration of stay  If discussed at [place] of Stay Meetings, dates discussed:    Comments:  14:30 CM notified Don[facility] of discharge; [facility] to render HHPT/RN.   No other CM needs communicated. Mariane Masters, BSN, [place] [phone].  02/14/15 3/30 1512 debbie dowell rn,bsn pt act w [facility] and mary w [facility] aware pt in hosp.

## 2015-02-11 NOTE — Consult Note (Addendum)
PULMONARY / CRITICAL CARE MEDICINE   Name: [name] MRN: [id number] DOB: 12/22/1924    ADMISSION DATE:  02/10/2015 CONSULTATION DATE:  3/30  REFERRING MD :  Cards  CHIEF COMPLAINT: Chest pain  INITIAL PRESENTATION: Dizziness  STUDIES:    SIGNIFICANT EVENTS: 3/30 hypotension   HISTORY OF PRESENT ILLNESS:   81 yo WF with an extensive cardiac history that includes CAF(on lopressor,eliquis, amio) non obstructive CAD, CHF (on Demadex? Dry weight), CKD(base creat 2.3) who was admitted 3/29 for chest pain(trop I   0.05) and dizziness. She was continued on diuretic , beta blocker, amio and early am 3/30 noted to be hypotensive(74/32) but asymptomatic. Demadex, lopressor were stopped, gentle hydration instituted and PCCM called to consult. 0700 3/30 PCCM arrive and find bp 102/48. Pt is awake and alert. Cortisol levels were sent, cardiac enzymes ordered and bedside echo(nsc) were also performed.  PAST MEDICAL HISTORY :   has a past medical history of Dyslipidemia; Hypertension; Palpitations; Chest pain; Degenerative joint disease; varicella; History of skin cancer; Thrombocytopenia; Anemia; Monoclonal gammopathies; Right lower quadrant abdominal pain (07/19/2012); Diastolic CHF, acute on chronic (01/31/2012); Atrial fibrillation; Cancer; Pacemaker; and Dysrhythmia.  has past surgical history that includes Cholecystectomy (1999); Cesarean section; Shoulder surgery (1996); Cataract extraction; Cardioversion (N/A, 02/26/2014); Cardioversion (N/A, 04/22/2014); Pacemaker insertion (04/23/2014); Insert / replace / remove pacemaker; Esophagogastroduodenoscopy (N/A, 07/06/2014); left heart catheterization with coronary angiogram (N/A, 01/29/2014); permanent pacemaker insertion (N/A, 04/23/2014); and right heart catheterization (N/A, 09/22/2014). Prior to Admission medications   Medication Sig Start Date End Date Taking? Authorizing Provider  acetaminophen (TYLENOL) 325 MG tablet Take 2 tablets (650 mg  total) by mouth every 4 (four) hours as needed for headache or mild pain. 09/26/14  Yes Isaiah Serge, NP  amiodarone (PACERONE) 200 MG tablet Take 1 tablet (200 mg total) by mouth daily. 11/06/14  Yes Jolaine Artist, MD  apixaban (ELIQUIS) 2.5 MG TABS tablet Take 1 tablet (2.5 mg total) by mouth 2 (two) times daily. 12/30/14  Yes Jolaine Artist, MD  ferrous sulfate 325 (65 FE) MG tablet Take 1 tablet (325 mg total) by mouth 2 (two) times daily with a meal. 11/21/14  Yes Burnis Medin, MD  levothyroxine (SYNTHROID, LEVOTHROID) 75 MCG tablet Take 1 tablet (75 mcg total) by mouth daily. 11/21/14  Yes Burnis Medin, MD  loratadine (CLARITIN) 10 MG tablet Take 10 mg by mouth daily as needed for allergies.    Yes Historical Provider, MD  metolazone (ZAROXOLYN) 2.5 MG tablet Take 1 tablet (2.5 mg total) by mouth as needed (for weight 136 lb or greater). 12/16/14  Yes Jolaine Artist, MD  metoprolol tartrate (LOPRESSOR) 25 MG tablet Take 25 mg by mouth 2 (two) times daily.  11/20/14  Yes Historical Provider, MD  nitroGLYCERIN (NITROSTAT) 0.4 MG SL tablet Place 1 tablet (0.4 mg total) under the tongue every 5 (five) minutes as needed. For chest pain 12/02/13  Yes Fay Records, MD  potassium chloride SA (KLOR-CON M20) 20 MEQ tablet Take 2 tablets (40 mEq total) by mouth 2 (two) times daily. 10/30/14  Yes Almyra Deforest, PA  simvastatin (ZOCOR) 10 MG tablet Take 1 tablet (10 mg total) by mouth at bedtime. 11/06/14  Yes Jolaine Artist, MD  torsemide (DEMADEX) 20 MG tablet Take 2 tablets (40 mg total) by mouth 2 (two) times daily. 12/10/14  Yes Brittainy Erie Noe, PA-C  LORazepam (ATIVAN) 0.5 MG tablet Take 0.5-1 tablets (0.25-0.5 mg total) by mouth every 8 (  eight) hours as needed for anxiety. 09/09/14   Burnis Medin, MD  pantoprazole (PROTONIX) 40 MG tablet Take 1 tablet (40 mg total) by mouth daily. 08/11/14   Burnis Medin, MD  senna (SENOKOT) 8.6 MG TABS tablet Take 2 tablets (17.2 mg total) by mouth daily  as needed for mild constipation. Patient not taking: Reported on 01/12/2015 09/26/14   Isaiah Serge, NP   Allergies  Allergen Reactions  . Tramadol Nausea Only    FAMILY HISTORY:  indicated that her mother is deceased. She indicated that her father is deceased. She indicated that her brother is deceased.  SOCIAL HISTORY:  reports that she has never smoked. She has never used smokeless tobacco. She reports that she does not drink alcohol or use illicit drugs.  REVIEW OF SYSTEMS:  10 point review of system taken, please see HPI for positives and negatives.   SUBJECTIVE:   VITAL SIGNS: Temp:  [97.6 F (36.4 C)-98 F (36.7 C)] 97.6 F (36.4 C) (03/30 0615) Pulse Rate:  [52-96] 59 (03/30 0715) Resp:  [11-25] 15 (03/30 0715) BP: (73-113)/(18-65) 104/47 mmHg (03/30 0715) SpO2:  [93 %-100 %] 97 % (03/30 0715) Weight:  [133 lb 3.9 oz (60.44 kg)-140 lb (63.504 kg)] 140 lb (63.504 kg) (03/30 0407) HEMODYNAMICS:   VENTILATOR SETTINGS:   INTAKE / OUTPUT:  Intake/Output Summary (Last 24 hours) at 02/11/15 0736 Last data filed at 02/11/15 9528  Gross per 24 hour  Intake    960 ml  Output    350 ml  Net    610 ml   PHYSICAL EXAMINATION: General:  WNWDWF nad at rest. Complains of joint pain Neuro:  Intact Head: [place]/AT Eyes: PERRL, EOM-I and MMM ENT: WNL Neck:  No JVD/LAN Cardiovascular:  HSR RRR paced at 60 Lungs:  Decreased air movement Abdomen:  + bs, slight tenderness Musculoskeletal:  intact Skin:  Warm, no edema  LABS:  CBC  Recent Labs Lab 02/10/15 1210  WBC 4.4  HGB 11.6*  HCT 36.9  PLT 120*   Coag's No results for input(s): APTT, INR in the last 168 hours. BMET  Recent Labs Lab 02/10/15 1210  NA 140  K 4.5  CL 106  CO2 29  BUN 42*  CREATININE 2.37*  GLUCOSE 109*   Electrolytes  Recent Labs Lab 02/10/15 1210  CALCIUM 9.5   Sepsis Markers No results for input(s): LATICACIDVEN, PROCALCITON, O2SATVEN in the last 168 hours. ABG No results  for input(s): PHART, PCO2ART, PO2ART in the last 168 hours. Liver Enzymes No results for input(s): AST, ALT, ALKPHOS, BILITOT, ALBUMIN in the last 168 hours. Cardiac Enzymes  Recent Labs Lab 02/10/15 1210 02/10/15 1941 02/11/15 0123  TROPONINI 0.05* 0.06* 0.05*   Glucose No results for input(s): GLUCAP in the last 168 hours.  Imaging Dg Chest 2 View  02/10/2015   CLINICAL DATA:  Chest discomfort and dizziness  EXAM: CHEST  2 VIEW  COMPARISON:  01/12/2015  FINDINGS: Cardiac shadow is mildly prominent. A pacing device is again seen and stable. The lungs are well aerated bilaterally without focal infiltrate or sizable effusion. Diffuse aortic calcifications are noted. No acute bony abnormality is seen.  IMPRESSION: No acute abnormality noted.  No change from the previous exam.   Electronically Signed   By: Inez Catalina M.D.   On: 02/10/2015 16:15   Ct Head Wo Contrast  02/10/2015   CLINICAL DATA:  Dizziness and gait disturbance, acute  EXAM: CT HEAD WITHOUT CONTRAST  TECHNIQUE: Contiguous  axial images were obtained from the base of the skull through the vertex without intravenous contrast.  COMPARISON:  January 12, 2015  FINDINGS: Age related volume loss is stable. There is no intracranial mass, hemorrhage, extra-axial fluid collection, or midline shift. Mild small vessel disease in the centra semiovale bilaterally is stable. There is no new gray-white compartment lesion. No acute appearing infarct apparent. Bony calvarium appears intact. The mastoid air cells are clear. Apparent cerumen in the right external auditory canal is unchanged.  IMPRESSION: Age related volume loss with mild supratentorial small vessel disease. No intracranial mass, hemorrhage, or acute appearing infarct. Probable cerumen in the right external auditory canal, stable. No appreciable change compared to recent prior study.   Electronically Signed   By: Lowella Grip III M.D.   On: 02/10/2015 15:38   ASSESSMENT /  PLAN:  PULMONARY OETT none A: Hypoxemia and atelectasis on exam.   I reviewed CXR myself with borderline basilar atelectasis. P:   Titrate O2 for sat (hypoxemic overnight). IS per RT protocol to combat atelectasis. Early mobilization. OOB to chair all measures for atelectasis.  CARDIOVASCULAR CVL None A:  Hypotension in setting of chronic diuresis, unknown dry weight, amiodarone, lopressor. Responded to hydration . Stopping diuresis and beta blockers P:  D/C diuretics. D/C beta blocker. Fluid bolus, received 750 ml will give another 500. Note that patient is more hypotensive when asleep, which is normal. HR is paced at 60, may consider increasing that but will defer to cardiology We could place cvl/pa cath to confirm dry state but this would be an over reaction right now. Establish dry weight. Consider increasing pacer rate to 70 May consider midodrine but will defer to cards at this point.  RENAL A:   CKD base creatine 2.3 P:   Follow creatine Suspect she is on dry side Gentle hydration BMET in AM. Replace electrolytes as indicated.  GASTROINTESTINAL A:   GI protection P:   PPI  HEMATOLOGIC A:  Chronic anticoagulation P:  Follow h/h Transfuse per protocol.  INFECTIOUS A:   No evidence of active infection, very unlikely to be septic shock P:   Monitor fever curve and WBC.  ENDOCRINE A:     RO adrenal insuff P:   Cortisol level>>  NEUROLOGIC A:   Dizziness P:   RASS goal: 0 Awake and alert  FAMILY  - Updates:   - Inter-disciplinary family meet or Palliative Care meeting due by:  day 7  TODAY'S SUMMARY:  81 yo WF with an extensive cardiac history that includes CAF(on lopressor,eliquis, amio) non obstructive CAD, CHF (on Demadex? Dry weight), CKD(base creat 2.3) who was admitted 3/29 for chest pain(trop I   0.05) and dizziness. She was continued on diuretic , beta blocker, amio and early am 3/30 noted to be hypotensive(74/32) but asymptomatic.  Demadex, lopressor were stopped, gentle hydration instituted and PCCM called to consult. 0700 3/30 PCCM arrive and find bp 102/48. Pt is awake and alert. Cortisol levels were sent, cardiac enzymes ordered and bedside echo(nsc) were also performed.  Richardson Landry Minor ACNP Maryanna Shape PCCM Pager [phone] till 3 pm If no answer page [phone]  Hypotension was likely a combination of beta blocker, dehydration and low pacer rate.  Patient responded well to hydration.  Will not place TLC or check CVPs for now.  I discussed the case with Dr. Missy Sabins and will likely start midodrine for orthostatic hypotension.  May transfer out of the ICU.  Would recommend discussion of code status given advanced  age and health concerns.  PCCM will sign off, please call back if needed.  Patient seen and examined, agree with above note.  I dictated the care and orders written for this patient under my direction.  Rush Farmer, MD [phone]  02/11/2015, 7:48 AM

## 2015-02-11 NOTE — Progress Notes (Signed)
   02/11/15 1400  Clinical Encounter Type  Visited With Patient and family together  Visit Type Initial;Spiritual support;Social support  Referral From [place] was referred to patient via spiritual care consult. Chaplain visited with patient for roughly 15 minutes today. Patient appeared to be in good spirits today and had a family member visiting her. Patient explained that she has been struggling with dizziness and sometimes forgets things. Patient identifies as a Nurse, learning disability. Chaplain made patient aware of his availability for future support and also let her know that a few times a week members of a local congregation give communion to all Catholic patients in the hospital. Chaplain will continue to provide emotional and spiritual support for patient and patient's family as needed.  Gar Ponto, Chaplain  2:32 PM

## 2015-02-11 NOTE — Progress Notes (Signed)
UR completed 

## 2015-02-12 DIAGNOSIS — R51 Headache: Secondary | ICD-10-CM | POA: Diagnosis not present

## 2015-02-12 DIAGNOSIS — N189 Chronic kidney disease, unspecified: Secondary | ICD-10-CM | POA: Diagnosis not present

## 2015-02-12 DIAGNOSIS — I129 Hypertensive chronic kidney disease with stage 1 through stage 4 chronic kidney disease, or unspecified chronic kidney disease: Secondary | ICD-10-CM | POA: Diagnosis not present

## 2015-02-12 DIAGNOSIS — R079 Chest pain, unspecified: Secondary | ICD-10-CM | POA: Diagnosis not present

## 2015-02-12 DIAGNOSIS — I480 Paroxysmal atrial fibrillation: Secondary | ICD-10-CM | POA: Diagnosis not present

## 2015-02-12 DIAGNOSIS — R55 Syncope and collapse: Secondary | ICD-10-CM | POA: Diagnosis not present

## 2015-02-12 DIAGNOSIS — I5032 Chronic diastolic (congestive) heart failure: Secondary | ICD-10-CM | POA: Diagnosis not present

## 2015-02-12 LAB — CBC
HCT: 34.3 % — ABNORMAL LOW (ref 36.0–46.0)
Hemoglobin: 10.8 g/dL — ABNORMAL LOW (ref 12.0–15.0)
MCH: 32.1 pg (ref 26.0–34.0)
MCHC: 31.5 g/dL (ref 30.0–36.0)
MCV: 102.1 fL — AB (ref 78.0–100.0)
Platelets: 112 10*3/uL — ABNORMAL LOW (ref 150–400)
RBC: 3.36 MIL/uL — AB (ref 3.87–5.11)
RDW: 14.3 % (ref 11.5–15.5)
WBC: 4.7 10*3/uL (ref 4.0–10.5)

## 2015-02-12 LAB — BASIC METABOLIC PANEL WITH GFR
Anion gap: 7 (ref 5–15)
BUN: 25 mg/dL — ABNORMAL HIGH (ref 6–23)
CO2: 23 mmol/L (ref 19–32)
Calcium: 9.4 mg/dL (ref 8.4–10.5)
Chloride: 109 mmol/L (ref 96–112)
Creatinine, Ser: 1.96 mg/dL — ABNORMAL HIGH (ref 0.50–1.10)
GFR calc Af Amer: 25 mL/min — ABNORMAL LOW
GFR calc non Af Amer: 22 mL/min — ABNORMAL LOW
Glucose, Bld: 150 mg/dL — ABNORMAL HIGH (ref 70–99)
Potassium: 5.7 mmol/L — ABNORMAL HIGH (ref 3.5–5.1)
Sodium: 139 mmol/L (ref 135–145)

## 2015-02-12 LAB — POTASSIUM: Potassium: 5.4 mmol/L — ABNORMAL HIGH (ref 3.5–5.1)

## 2015-02-12 LAB — BASIC METABOLIC PANEL
ANION GAP: 6 (ref 5–15)
BUN: 28 mg/dL — AB (ref 6–23)
CALCIUM: 9.3 mg/dL (ref 8.4–10.5)
CO2: 24 mmol/L (ref 19–32)
Chloride: 111 mmol/L (ref 96–112)
Creatinine, Ser: 1.97 mg/dL — ABNORMAL HIGH (ref 0.50–1.10)
GFR calc non Af Amer: 22 mL/min — ABNORMAL LOW (ref >90)
GFR, EST AFRICAN AMERICAN: 25 mL/min — AB (ref >90)
GLUCOSE: 91 mg/dL (ref 70–99)
Potassium: 5.5 mmol/L — ABNORMAL HIGH (ref 3.5–5.1)
Sodium: 141 mmol/L (ref 135–145)

## 2015-02-12 LAB — ACTH STIMULATION, 3 TIME POINTS
Cortisol, 30 Min: 24.3 ug/dL (ref >20.0)
Cortisol, 60 Min: 32.7 ug/dL (ref >20)
Cortisol, Base: 12.3 ug/dL

## 2015-02-12 MED ORDER — TORSEMIDE 20 MG PO TABS
40.0000 mg | ORAL_TABLET | Freq: Every day | ORAL | Status: DC
  Administered 2015-02-12 – 2015-02-14 (×3): 40 mg via ORAL
  Filled 2015-02-12 (×3): qty 2

## 2015-02-12 MED ORDER — MIDODRINE HCL 5 MG PO TABS
2.5000 mg | ORAL_TABLET | Freq: Two times a day (BID) | ORAL | Status: DC
  Administered 2015-02-12 – 2015-02-14 (×4): 2.5 mg via ORAL
  Filled 2015-02-12 (×4): qty 1

## 2015-02-12 NOTE — Progress Notes (Signed)
Advanced Heart Failure Rounding Note   Subjective:    Feels a bit better. SBP in 90-100s. Pacing rate turned up to 70. Weight stable at 138. Cortosyn stim test normal. Had episode of dyspnea this am but now better. Has not been out of bed yet so doesn't know if she is dizzy.   Objective:   Weight Range:  Vital Signs:   Temp:  [97.6 F (36.4 C)-98.9 F (37.2 C)] 98.9 F (37.2 C) (03/31 1330) Pulse Rate:  [70-80] 75 (03/31 1330) Resp:  [16] 16 (03/31 1330) BP: (74-120)/(40-61) 102/57 mmHg (03/31 1330) SpO2:  [96 %-100 %] 100 % (03/31 1330) Weight:  [62.687 kg (138 lb 3.2 oz)] 62.687 kg (138 lb 3.2 oz) (03/31 0605) Last BM Date: 02/11/15  Weight change: Filed Weights   02/10/15 2028 02/11/15 0407 02/12/15 0605  Weight: 60.44 kg (133 lb 3.9 oz) 63.504 kg (140 lb) 62.687 kg (138 lb 3.2 oz)    Intake/Output:   Intake/Output Summary (Last 24 hours) at 02/12/15 1431 Last data filed at 02/12/15 1300  Gross per 24 hour  Intake    480 ml  Output    500 ml  Net    -20 ml     Physical Exam: General: Elderly Frail appearing. No resp difficulty. Daughter present HEENT: normal except  Neck: supple. JVP flat. Carotids 2+ bilaterally; no bruits. No lymphadenopathy or thryomegaly appreciated. Cor: PMI normal. Regular rate & rhythm. No rubs, gallops or murmurs. Lungs: clear Abdomen: soft, nontender, nondistended. No hepatosplenomegaly. No bruits or masses. Good bowel sounds. Extremities: no cyanosis, clubbing, rash, R and LLE severe varicose veins no edema.  Neuro: alert & orientedx3, cranial nerves grossly intact. Moves all 4 extremities w/o difficulty. Affect pleasant.  Telemetry: AV paced 70  Labs: Basic Metabolic Panel:  Recent Labs Lab 02/10/15 1210 02/11/15 0650 02/12/15 0512  NA 140 141 141  K 4.5 3.8 5.5*  CL 106 105 111  CO2 29 28 24   GLUCOSE 109* 102* 91  BUN 42* 36* 28*  CREATININE 2.37* 2.22* 1.97*  CALCIUM 9.5 9.3 9.3    Liver Function  Tests:  Recent Labs Lab 02/11/15 0650  AST 21  ALT 17  ALKPHOS 86  BILITOT 0.5  PROT 5.3*  ALBUMIN 2.8*    Recent Labs Lab 02/11/15 0650  LIPASE 38   No results for input(s): AMMONIA in the last 168 hours.  CBC:  Recent Labs Lab 02/10/15 1210 02/11/15 0650 02/12/15 0500  WBC 4.4 3.6* 4.7  NEUTROABS 3.1  --   --   HGB 11.6* 10.8* 10.8*  HCT 36.9 33.5* 34.3*  MCV 100.0 100.9* 102.1*  PLT 120* 105* 112*    Cardiac Enzymes:  Recent Labs Lab 02/10/15 1210 02/10/15 1941 02/11/15 0123 02/11/15 0650  TROPONINI 0.05* 0.06* 0.05* 0.06*    BNP: BNP (last 3 results)  Recent Labs  12/06/14 0157 12/26/14 0554 02/10/15 1941  BNP 947.6* 282.1* 535.6*    ProBNP (last 3 results)  Recent Labs  08/27/14 1147 09/18/14 1317 10/25/14 0955  PROBNP 914.0* 1874.0* 13191.0*      Other results:  Imaging: Dg Chest 2 View  02/10/2015   CLINICAL DATA:  Chest discomfort and dizziness  EXAM: CHEST  2 VIEW  COMPARISON:  01/12/2015  FINDINGS: Cardiac shadow is mildly prominent. A pacing device is again seen and stable. The lungs are well aerated bilaterally without focal infiltrate or sizable effusion. Diffuse aortic calcifications are noted. No acute bony abnormality is seen.  IMPRESSION: No  acute abnormality noted.  No change from the previous exam.   Electronically Signed   By: Inez Catalina M.D.   On: 02/10/2015 16:15   Ct Head Wo Contrast  02/10/2015   CLINICAL DATA:  Dizziness and gait disturbance, acute  EXAM: CT HEAD WITHOUT CONTRAST  TECHNIQUE: Contiguous axial images were obtained from the base of the skull through the vertex without intravenous contrast.  COMPARISON:  January 12, 2015  FINDINGS: Age related volume loss is stable. There is no intracranial mass, hemorrhage, extra-axial fluid collection, or midline shift. Mild small vessel disease in the centra semiovale bilaterally is stable. There is no new gray-white compartment lesion. No acute appearing infarct  apparent. Bony calvarium appears intact. The mastoid air cells are clear. Apparent cerumen in the right external auditory canal is unchanged.  IMPRESSION: Age related volume loss with mild supratentorial small vessel disease. No intracranial mass, hemorrhage, or acute appearing infarct. Probable cerumen in the right external auditory canal, stable. No appreciable change compared to recent prior study.   Electronically Signed   By: Lowella Grip III M.D.   On: 02/10/2015 15:38     Medications:     Scheduled Medications: . amiodarone  200 mg Oral Daily  . apixaban  2.5 mg Oral BID  . ferrous sulfate  325 mg Oral BID WC  . levothyroxine  75 mcg Oral QAC breakfast  . simvastatin  10 mg Oral q1800    Infusions:    PRN Medications: acetaminophen, ALPRAZolam, loratadine, ondansetron (ZOFRAN) IV, zolpidem   Assessment:   1. Hypotension 2. Chronic diastolic HF 3. PAF with tachy brady - s/p PPM (STJ Assurity)     --maintainin NSR on amio  4. CKD, stage IV  Plan/Discussion:    Volume status looks good. Will resume demadex at 40 daily for now. Place compression hose. Atrial rate increased to 70.  Cortisol fine.  Suspect may have some autonomic dysfunction. Will add midodrine 2.5 bid. PT/OT to see.   Home in am probably.   Continue apixaban for PAF.   Length of Stay:    Glori Bickers  MD  02/12/2015, 2:31 PM  Advanced Heart Failure Team Pager [phone] (M-F; 7a - 4p)  Please contact [facility] for night-coverage after hours (4p -7a ) and weekends on [email or web address]

## 2015-02-12 NOTE — Progress Notes (Signed)
UR completed 

## 2015-02-12 NOTE — Progress Notes (Signed)
Nursing Note: Bp 99/58 P-80 the patient resting quietly in bed without complaints.wbb

## 2015-02-12 NOTE — Progress Notes (Signed)
Nursing Note: Bp-91/44 p-70.No complaints,resting quietly.wbb

## 2015-02-12 NOTE — Progress Notes (Signed)
Nursing Note: Pt resting quietly in bed. BP-90/44 P-80. BP taken manually.wbb

## 2015-02-13 DIAGNOSIS — I5032 Chronic diastolic (congestive) heart failure: Secondary | ICD-10-CM | POA: Diagnosis not present

## 2015-02-13 DIAGNOSIS — I9589 Other hypotension: Secondary | ICD-10-CM | POA: Diagnosis not present

## 2015-02-13 DIAGNOSIS — R55 Syncope and collapse: Secondary | ICD-10-CM | POA: Diagnosis not present

## 2015-02-13 LAB — BASIC METABOLIC PANEL
Anion gap: 4 — ABNORMAL LOW (ref 5–15)
BUN: 24 mg/dL — AB (ref 6–23)
CALCIUM: 9.2 mg/dL (ref 8.4–10.5)
CO2: 28 mmol/L (ref 19–32)
CREATININE: 1.91 mg/dL — AB (ref 0.50–1.10)
Chloride: 108 mmol/L (ref 96–112)
GFR calc Af Amer: 26 mL/min — ABNORMAL LOW (ref >90)
GFR, EST NON AFRICAN AMERICAN: 22 mL/min — AB (ref >90)
GLUCOSE: 90 mg/dL (ref 70–99)
Potassium: 4.1 mmol/L (ref 3.5–5.1)
Sodium: 140 mmol/L (ref 135–145)

## 2015-02-13 LAB — BRAIN NATRIURETIC PEPTIDE: B Natriuretic Peptide: 377.9 pg/mL — ABNORMAL HIGH (ref 0.0–100.0)

## 2015-02-13 NOTE — Progress Notes (Signed)
UR completed 

## 2015-02-13 NOTE — Evaluation (Signed)
Physical Therapy Evaluation Patient Details Name: [name] MRN: [id number] DOB: 03-30-1925 Today's Date: 02/13/2015   History of Present Illness   Pt admit with near syncope.  Clinical Impression  Pt admitted with above diagnosis. Pt currently with functional limitations due to the deficits listed below (see PT Problem List). Pt will need HHPT f/u.  Has equipment.  Overall good safety with RW.  Pt aware to use RW at all times.  Pt will benefit from skilled PT to increase their independence and safety with mobility to allow discharge to the venue listed below.      Follow Up Recommendations Home health PT;Supervision - Intermittent    Equipment Recommendations  None recommended by PT    Recommendations for Other Services       Precautions / Restrictions Precautions Precautions: Fall Restrictions Weight Bearing Restrictions: No      Mobility  Bed Mobility Overal bed mobility: Independent                Transfers Overall transfer level: Independent                  Ambulation/Gait Ambulation/Gait assistance: Supervision Ambulation Distance (Feet): 450 Feet Assistive device: Rolling walker (2 wheeled) Gait Pattern/deviations: Step-through pattern;Decreased stride length;Trunk flexed;Wide base of support   Gait velocity interpretation: Below normal speed for age/gender General Gait Details: Pt overall good safety awareness with RW.  Pt with flexed posture but did stand taller with RW.  Without RW, pts flexed posture worsens quickly.  Pt aware that PT recommends use of RW at all times on D/C.  Pt needs cues to slow down and stay close to RW occasionally.    Stairs            Wheelchair Mobility    Modified Rankin (Stroke Patients Only)       Balance Overall balance assessment: Needs assistance         Standing balance support: Bilateral upper extremity supported;During functional activity Standing balance-Leahy Scale: Fair Standing  balance comment: can stand statically without UE support for brief intervals.              High level balance activites: Turns;Sudden stops High Level Balance Comments: cannot withstand more than min challenges to balance with RW.              Pertinent Vitals/Pain Pain Assessment: No/denies pain  VSS    Home Living Family/patient expects to be discharged to:: Private residence Living Arrangements: Alone Available Help at Discharge: Family;Available PRN/intermittently Type of Home: House Home Access: Stairs to enter Entrance Stairs-Rails: Can reach both;Left;Right Entrance Stairs-Number of Steps: 4 Home Layout: One level Home Equipment: Walker - 4 wheels;Cane - single point;Grab bars - toilet;Grab bars - tub/shower;Tub bench;Bedside commode Additional Comments: Pt's daughter checks on her frequently during the day. Lives only a few minutes away.    Prior Function Level of Independence: Independent with assistive device(s)         Comments: Still driving, Doing minimal grocery shopping with daughter     Hand Dominance   Dominant Hand: Right    Extremity/Trunk Assessment   Upper Extremity Assessment: Defer to OT evaluation           Lower Extremity Assessment: Generalized weakness      Cervical / Trunk Assessment: Kyphotic  Communication   Communication: HOH  Cognition Arousal/Alertness: Awake/alert Behavior During Therapy: WFL for tasks assessed/performed Overall Cognitive Status: Within Functional Limits for tasks assessed  General Comments      Exercises        Assessment/Plan    PT Assessment Patient needs continued PT services  PT Diagnosis Generalized weakness   PT Problem List Decreased activity tolerance;Decreased balance;Decreased mobility;Decreased knowledge of use of DME;Decreased safety awareness;Decreased knowledge of precautions  PT Treatment Interventions DME instruction;Gait training;Functional  mobility training;Therapeutic activities;Therapeutic exercise;Balance training;Patient/family education;Stair training   PT Goals (Current goals can be found in the Care Plan section) Acute Rehab PT Goals Patient Stated Goal: to go home PT Goal Formulation: With patient Time For Goal Achievement: 02/20/15 Potential to Achieve Goals: Good    Frequency Min 3X/week   Barriers to discharge Decreased caregiver support      Co-evaluation               End of Session Equipment Utilized During Treatment: Gait belt Activity Tolerance: Patient limited by fatigue Patient left: in chair;with call bell/phone within reach Nurse Communication: Mobility status    Functional Assessment Tool Used: clinical judgment Functional Limitation: Mobility: Walking and moving around Mobility: Walking and Moving Around Current Status (V9480): At least 1 percent but less than 20 percent impaired, limited or restricted Mobility: Walking and Moving Around Goal Status [phone]): At least 1 percent but less than 20 percent impaired, limited or restricted    Time: 7482-7078 PT Time Calculation (min) (ACUTE ONLY): 16 min   Charges:   PT Evaluation $Initial PT Evaluation Tier I: 1 Procedure     PT G Codes:   PT G-Codes **NOT FOR INPATIENT CLASS** Functional Assessment Tool Used: clinical judgment Functional Limitation: Mobility: Walking and moving around Mobility: Walking and Moving Around Current Status (M7544): At least 1 percent but less than 20 percent impaired, limited or restricted Mobility: Walking and Moving Around Goal Status [phone]): At least 1 percent but less than 20 percent impaired, limited or restricted    Denice Paradise 02/13/2015, 4:26 PM Mckynlie Vanderslice,PT Acute Rehabilitation [phone] (pager)

## 2015-02-13 NOTE — Progress Notes (Signed)
Advanced Heart Failure Rounding Note  02/13/2015  Subjective:   81 yo female w/ hx CAF(on lopressor,eliquis, amio), non obstructive CAD, D-CHF (on Demadex? Dry weight), CKD(base creat 2.3) who was admitted 3/29 for chest pain(trop I 0.05) and dizziness. Dry wt 137 lbs  Says she still feels dizzy but it is better, thinks she can go home tomorrow. Forgetful. Lives alone, dtr nearby, says dtr helps her at times.    Objective:   Weight Range: 133-140, now 138  Vital Signs:   Temp:  [97.6 F (36.4 C)-98.9 F (37.2 C)] 97.6 F (36.4 C) (04/01 0420) Pulse Rate:  [71-76] 71 (04/01 0420) Resp:  [16] 16 (03/31 1330) BP: (99-105)/(45-57) 99/53 mmHg (04/01 0420) SpO2:  [95 %-100 %] 97 % (04/01 0420) Weight:  [138 lb 6.4 oz (62.778 kg)] 138 lb 6.4 oz (62.778 kg) (04/01 0420) Last BM Date: 02/10/15  Weight change: Filed Weights   02/11/15 0407 02/12/15 0605 02/13/15 0420  Weight: 140 lb (63.504 kg) 138 lb 3.2 oz (62.687 kg) 138 lb 6.4 oz (62.778 kg)   Weight change: 3.2 oz (0.091 kg)  Intake/Output:   Intake/Output Summary (Last 24 hours) at 02/13/15 1054 Last data filed at 02/13/15 0900  Gross per 24 hour  Intake    600 ml  Output      0 ml  Net    600 ml     Physical Exam: General:  Elderly appearing. No resp difficulty HEENT: normal Neck: supple. JVP 5-6 Carotids 2+ bilat; No lymphadenopathy or thryomegaly appreciated. Cor: PMI nondisplaced. Regular rate & rhythm. No rubs, gallops, 2/6 murmurs. Lungs: few rales, good air exchange Abdomen: soft, nontender, nondistended. No hepatosplenomegaly. No bruits or masses. Good bowel sounds. Extremities: no cyanosis, clubbing, rash, edema Neuro: alert & orientedx3, cranial nerves grossly intact. moves all 4 extremities w/o difficulty. Affect pleasant  Telemetry: NSR  Labs: Basic Metabolic Panel:  Recent Labs Lab 02/10/15 1210 02/11/15 0650 02/12/15 0512 02/12/15 1349 02/12/15 1658 02/13/15 0530  NA 140 141 141 139  --  140   K 4.5 3.8 5.5* 5.7* 5.4* 4.1  CL 106 105 111 109  --  108  CO2 29 28 24 23   --  28  GLUCOSE 109* 102* 91 150*  --  90  BUN 42* 36* 28* 25*  --  24*  CREATININE 2.37* 2.22* 1.97* 1.96*  --  1.91*  CALCIUM 9.5 9.3 9.3 9.4  --  9.2    Liver Function Tests:  Recent Labs Lab 02/11/15 0650  AST 21  ALT 17  ALKPHOS 86  BILITOT 0.5  PROT 5.3*  ALBUMIN 2.8*    Recent Labs Lab 02/11/15 0650  LIPASE 38   CBC:  Recent Labs Lab 02/10/15 1210 02/11/15 0650 02/12/15 0500  WBC 4.4 3.6* 4.7  NEUTROABS 3.1  --   --   HGB 11.6* 10.8* 10.8*  HCT 36.9 33.5* 34.3*  MCV 100.0 100.9* 102.1*  PLT 120* 105* 112*    Cardiac Enzymes:  Recent Labs Lab 02/10/15 1210 02/10/15 1941 02/11/15 0123 02/11/15 0650  TROPONINI 0.05* 0.06* 0.05* 0.06*    BNP: BNP (last 3 results)  Recent Labs  12/26/14 0554 02/10/15 1941 02/13/15 0530  BNP 282.1* 535.6* 377.9*    ProBNP (last 3 results)  Recent Labs  08/27/14 1147 09/18/14 1317 10/25/14 0955  PROBNP 914.0* 1874.0* 13191.0*    Other results:  EKG:   Imaging:  No results found.   Medications:     Scheduled Medications: . amiodarone  200 mg Oral Daily  . apixaban  2.5 mg Oral BID  . ferrous sulfate  325 mg Oral BID WC  . levothyroxine  75 mcg Oral QAC breakfast  . midodrine  2.5 mg Oral BID WC  . simvastatin  10 mg Oral q1800  . torsemide  40 mg Oral Daily     Infusions:     PRN Medications:  acetaminophen, ALPRAZolam, loratadine, ondansetron (ZOFRAN) IV, zolpidem   Assessment:   1. Hypotension 2. Chronic diastolic HF 3. PAF with tachy brady - s/p PPM (STJ Assurity)   --maintainin NSR on amio  4. CKD, stage IV  Plan/Discussion:   From 03/31: Volume status looks good. Will resume demadex at 40 daily for now. Place compression hose. Atrial rate increased to 70. Cortisol fine. Suspect may have some autonomic dysfunction. Will add midodrine 2.5 bid. PT/OT to see.  Home in am probably.   Continue apixaban for PAF.   04/01:  Pt feels not ready for d/c. Not sure how well she is ambulating, PT/OT ordered, eval pending. She wants another pair of stockings, but didn't do well w/ them in the past, could not get on/off. ?BPPV.  She may be getting close to not living alone, not sure. May need to discuss w/ daughter.   Length of Stay:  [place], PA-C 02/13/2015 11:21 AM Beeper [phone]  Patient seen and examined with Rosaria Ferries, PA-C. We discussed all aspects of the encounter. I agree with the assessment and plan as stated above.   Much improved with cutting back torsemide and addition of midodrine. Will work with PT today and hopefully home in am. D/c on torsemide 40 daily. Midodrine 2.5 bid (can increase to 5 bid as needed)  Benay Spice 12:55 PM

## 2015-02-14 DIAGNOSIS — R55 Syncope and collapse: Secondary | ICD-10-CM | POA: Diagnosis not present

## 2015-02-14 LAB — BASIC METABOLIC PANEL
Anion gap: 4 — ABNORMAL LOW (ref 5–15)
BUN: 28 mg/dL — AB (ref 6–23)
CO2: 30 mmol/L (ref 19–32)
Calcium: 9.2 mg/dL (ref 8.4–10.5)
Chloride: 105 mmol/L (ref 96–112)
Creatinine, Ser: 2.01 mg/dL — ABNORMAL HIGH (ref 0.50–1.10)
GFR calc Af Amer: 25 mL/min — ABNORMAL LOW (ref >90)
GFR, EST NON AFRICAN AMERICAN: 21 mL/min — AB (ref >90)
Glucose, Bld: 99 mg/dL (ref 70–99)
Potassium: 3.9 mmol/L (ref 3.5–5.1)
Sodium: 139 mmol/L (ref 135–145)

## 2015-02-14 MED ORDER — MIDODRINE HCL 2.5 MG PO TABS: 2.5000 mg | ORAL_TABLET | Freq: Two times a day (BID) | ORAL | Status: DC

## 2015-02-14 MED ORDER — TORSEMIDE 20 MG PO TABS: 40.0000 mg | ORAL_TABLET | Freq: Every day | ORAL | Status: DC

## 2015-02-14 MED ORDER — POTASSIUM CHLORIDE CRYS ER 20 MEQ PO TBCR: 40.0000 meq | EXTENDED_RELEASE_TABLET | Freq: Every day | ORAL | Status: DC

## 2015-02-14 NOTE — Progress Notes (Signed)
Patient Name: [name] Date of Encounter: 02/14/2015  Primary Cardiologist: Dr. Ross/[facility] clinic   Principal Problem:   Near syncope Active Problems:   Chest pain    SUBJECTIVE  Denies any CP or SOB. Dizziness improved, only mildly dizzy. Feels good. Says she's still very much independent and don't like it when there is other people around to "fuss" at her. She states her daughter come back every now and then to take care of her and she does have a nurse to come by once a week to check on her.  CURRENT MEDS . amiodarone  200 mg Oral Daily  . apixaban  2.5 mg Oral BID  . ferrous sulfate  325 mg Oral BID WC  . levothyroxine  75 mcg Oral QAC breakfast  . midodrine  2.5 mg Oral BID WC  . simvastatin  10 mg Oral q1800  . torsemide  40 mg Oral Daily    OBJECTIVE  Filed Vitals:   02/13/15 0420 02/13/15 1354 02/13/15 2039 02/14/15 0500  BP: 99/53 109/56 130/55 111/60  Pulse: 71 75 75 73  Temp: 97.6 F (36.4 C) 98.4 F (36.9 C) 98.4 F (36.9 C) 97.5 F (36.4 C)  TempSrc: Oral Oral    Resp:  18 20 21   Height:      Weight: 138 lb 6.4 oz (62.778 kg)   134 lb (60.782 kg)  SpO2: 97% 100% 96% 95%    Intake/Output Summary (Last 24 hours) at 02/14/15 0801 Last data filed at 02/14/15 0500  Gross per 24 hour  Intake   1320 ml  Output      0 ml  Net   1320 ml   Filed Weights   02/12/15 0605 02/13/15 0420 02/14/15 0500  Weight: 138 lb 3.2 oz (62.687 kg) 138 lb 6.4 oz (62.778 kg) 134 lb (60.782 kg)    PHYSICAL EXAM  General: Pleasant, NAD. Neuro: Alert and oriented X 3. Moves all extremities spontaneously. Psych: Normal affect. HEENT:  Normal  Neck: Supple without bruits or JVD. Lungs:  Resp regular and unlabored, CTA. Heart: RRR no s3, s4, or murmurs. PPM noted on R side (not on L side) Abdomen: Soft, non-tender, non-distended, BS + x 4.  Extremities: No clubbing, cyanosis or edema. DP/PT/Radials 2+ and equal bilaterally.  Accessory Clinical  Findings  CBC  Recent Labs  02/12/15 0500  WBC 4.7  HGB 10.8*  HCT 34.3*  MCV 102.1*  PLT 174*   Basic Metabolic Panel  Recent Labs  02/13/15 0530 02/14/15 0401  NA 140 139  K 4.1 3.9  CL 108 105  CO2 28 30  GLUCOSE 90 99  BUN 24* 28*  CREATININE 1.91* 2.01*  CALCIUM 9.2 9.2    TELE Paced rhythm    ECG  No new EKG  Echocardiogram 02/25/2014  LV EF: 60% -  65%  ------------------------------------------------------------ Indications:   CHF - 428.0.  ------------------------------------------------------------ History:  PMH: Hypothyroidism. Chronic atrial fibrillation. Hyperglycemia. Non-ST elevated myocardial infarction. Coronary artery disease. Risk factors: Hypertension.  ------------------------------------------------------------ Study Conclusions  - Left ventricle: The cavity size was normal. Wall thickness was increased in a pattern of mild LVH. There was focal basal hypertrophy. Systolic function was normal. The estimated ejection fraction was in the range of 60% to 65%. Wall motion was normal; there were no regional wall motion abnormalities. - Left atrium: The atrium was mildly dilated. - Right atrium: The atrium was mildly dilated. - Pericardium, extracardiac: A trivial pericardial effusion was identified.  Radiology/Studies  Dg Chest 2 View  02/10/2015   CLINICAL DATA:  Chest discomfort and dizziness  EXAM: CHEST  2 VIEW  COMPARISON:  01/12/2015  FINDINGS: Cardiac shadow is mildly prominent. A pacing device is again seen and stable. The lungs are well aerated bilaterally without focal infiltrate or sizable effusion. Diffuse aortic calcifications are noted. No acute bony abnormality is seen.  IMPRESSION: No acute abnormality noted.  No change from the previous exam.   Electronically Signed   By: Inez Catalina M.D.   On: 02/10/2015 16:15   Ct Head Wo Contrast  02/10/2015   CLINICAL DATA:  Dizziness and gait  disturbance, acute  EXAM: CT HEAD WITHOUT CONTRAST  TECHNIQUE: Contiguous axial images were obtained from the base of the skull through the vertex without intravenous contrast.  COMPARISON:  January 12, 2015  FINDINGS: Age related volume loss is stable. There is no intracranial mass, hemorrhage, extra-axial fluid collection, or midline shift. Mild small vessel disease in the centra semiovale bilaterally is stable. There is no new gray-white compartment lesion. No acute appearing infarct apparent. Bony calvarium appears intact. The mastoid air cells are clear. Apparent cerumen in the right external auditory canal is unchanged.  IMPRESSION: Age related volume loss with mild supratentorial small vessel disease. No intracranial mass, hemorrhage, or acute appearing infarct. Probable cerumen in the right external auditory canal, stable. No appreciable change compared to recent prior study.   Electronically Signed   By: Lowella Grip III M.D.   On: 02/10/2015 15:38    ASSESSMENT AND PLAN  81 yo female w/ hx CAF(on lopressor,eliquis, amio), non obstructive CAD, D-CHF (on Demadex? Dry weight), CKD(base creat 2.3) who was admitted 3/29 for chest pain(trop I 0.05) and dizziness. Dry wt 137 lbs  1. Hypotension: stable  - continue midodrine.  2. Chronic diastolic heart failure  - continue torsemide 40mg . Euvolemic on exam, stable for discharge.   3. PAF with tachy brady s/p STJ Assurity PPM  - continue eliquis and amiodarone  4. CKD, stage IV  Weston Brass [place] Ku Pager: [phone] Patient seen and examined. I agree with the assessment and plan as detailed above. See also my additional thoughts below.   I agree with the note as outlined. The patient is stable and ready for discharge home.  Dola Argyle, MD, [facility] 02/14/2015 8:40 AM

## 2015-02-14 NOTE — Progress Notes (Signed)
UR completed 

## 2015-02-14 NOTE — Discharge Instructions (Signed)
Information on my medicine - ELIQUIS (apixaban)  This medication education was reviewed with me or my healthcare representative as part of my discharge preparation.  The pharmacist that spoke with me during my hospital stay was:  [place], Julaine Hua, [facility]  Why was Eliquis prescribed for you? Eliquis was prescribed for you to reduce the risk of a blood clot forming that can cause a stroke if you have a medical condition called atrial fibrillation (a type of irregular heartbeat).  What do You need to know about Eliquis ? Take your Eliquis TWICE DAILY - one tablet in the morning and one tablet in the evening with or without food. If you have difficulty swallowing the tablet whole please discuss with your pharmacist how to take the medication safely.  Take Eliquis exactly as prescribed by your doctor and DO NOT stop taking Eliquis without talking to the doctor who prescribed the medication.  Stopping may increase your risk of developing a stroke.  Refill your prescription before you run out.  After discharge, you should have regular check-up appointments with your healthcare provider that is prescribing your Eliquis.  In the future your dose may need to be changed if your kidney function or weight changes by a significant amount or as you get older.  What do you do if you miss a dose? If you miss a dose, take it as soon as you remember on the same day and resume taking twice daily.  Do not take more than one dose of ELIQUIS at the same time to make up a missed dose.  Important Safety Information A possible side effect of Eliquis is bleeding. You should call your healthcare provider right away if you experience any of the following: ? Bleeding from an injury or your nose that does not stop. ? Unusual colored urine (red or dark brown) or unusual colored stools (red or black). ? Unusual bruising for unknown reasons. ? A serious fall or if you hit your head (even if there is no bleeding).  Some  medicines may interact with Eliquis and might increase your risk of bleeding or clotting while on Eliquis. To help avoid this, consult your healthcare provider or pharmacist prior to using any new prescription or non-prescription medications, including herbals, vitamins, non-steroidal anti-inflammatory drugs (NSAIDs) and supplements.  This website has more information on Eliquis (apixaban): http://[email or web address].com/[email or web address]  Heart Failure Heart failure means your heart has trouble pumping blood. This makes it hard for your body to work well. Heart failure is usually a long-term (chronic) condition. You must take good care of yourself and follow your doctor's treatment plan. HOME CARE  Take your heart medicine as told by your doctor.  Do not stop taking medicine unless your doctor tells you to.  Do not skip any dose of medicine.  Refill your medicines before they run out.  Take other medicines only as told by your doctor or pharmacist.  Stay active if told by your doctor. The elderly and people with severe heart failure should talk with a doctor about physical activity.  Eat heart-healthy foods. Choose foods that are without trans fat and are low in saturated fat, cholesterol, and salt (sodium). This includes fresh or frozen fruits and vegetables, fish, lean meats, fat-free or low-fat dairy foods, whole grains, and high-fiber foods. Lentils and dried peas and beans (legumes) are also good choices.  Limit salt if told by your doctor.  Cook in a healthy way. Roast, grill, broil, bake, poach, steam, or stir-fry foods.  Limit fluids as told by your doctor.  Weigh yourself every morning. Do this after you pee (urinate) and before you eat breakfast. Write down your weight to give to your doctor.  Take your blood pressure and write it down if your doctor tells you to.  Ask your doctor how to check your pulse. Check your pulse as told.  Lose weight if told by your doctor.  Stop  smoking or chewing tobacco. Do not use gum or patches that help you quit without your doctor's approval.  Schedule and go to doctor visits as told.  Nonpregnant women should have no more than 1 drink a day. Men should have no more than 2 drinks a day. Talk to your doctor about drinking alcohol.  Stop illegal drug use.  Stay current with shots (immunizations).  Manage your health conditions as told by your doctor.  Learn to manage your stress.  Rest when you are tired.  If it is really hot outside:  Avoid intense activities.  Use air conditioning or fans, or get in a cooler place.  Avoid caffeine and alcohol.  Wear loose-fitting, lightweight, and light-colored clothing.  If it is really cold outside:  Avoid intense activities.  Layer your clothing.  Wear mittens or gloves, a hat, and a scarf when going outside.  Avoid alcohol.  Learn about heart failure and get support as needed.  Get help to maintain or improve your quality of life and your ability to care for yourself as needed. GET HELP IF:   You gain 03 lb/1.4 kg or more in 1 day or 05 lb/2.3 kg in a week.  You are more short of breath than usual.  You cannot do your normal activities.  You tire easily.  You cough more than normal, especially with activity.  You have any or more puffiness (swelling) in areas such as your hands, feet, ankles, or belly (abdomen).  You cannot sleep because it is hard to breathe.  You feel like your heart is beating fast (palpitations).  You get dizzy or light-headed when you stand up. GET HELP RIGHT AWAY IF:   You have trouble breathing.  There is a change in mental status, such as becoming less alert or not being able to focus.  You have chest pain or discomfort.  You faint. MAKE SURE YOU:   Understand these instructions.  Will watch your condition.  Will get help right away if you are not doing well or get worse. Document Released: 08/09/2008 Document  Revised: 03/17/2014 Document Reviewed: 12/17/2012 [facility] Patient Information 2015 [place], [place]. This information is not intended to replace advice given to you by your health care provider. Make sure you discuss any questions you have with your health care provider.  Hypotension As your heart beats, it forces blood through your body. This force is called blood pressure. If you have hypotension, you have low blood pressure. When your blood pressure is too low, you may not get enough blood to your brain. You may feel weak, feel lightheaded, have a fast heartbeat, or even pass out (faint). HOME CARE  Drink enough fluids to keep your pee (urine) clear or pale yellow.  Take all medicines as told by your doctor.  Get up slowly after sitting or lying down.  Wear support stockings as told by your doctor.  Maintain a healthy diet by including foods such as fruits, vegetables, nuts, whole grains, and lean meats. GET HELP IF:  You are throwing up (vomiting) or have watery  poop (diarrhea).  You have a fever for more than 2-3 days.  You feel more thirsty than usual.  You feel weak and tired. GET HELP RIGHT AWAY IF:   You pass out (faint).  You have chest pain or a fast or irregular heartbeat.  You lose feeling in part of your body.  You cannot move your arms or legs.  You have trouble speaking.  You get sweaty or feel lightheaded. MAKE SURE YOU:   Understand these instructions.  Will watch your condition.  Will get help right away if you are not doing well or get worse. Document Released: 01/25/2010 Document Revised: 07/03/2013 Document Reviewed: 05/03/2013 [facility] Patient Information 2015 [place], [place]. This information is not intended to replace advice given to you by your health care provider. Make sure you discuss any questions you have with your health care provider.   Midodrine tablets What is this medicine? MIDODRINE (MI doe dreen) is used to treat low blood  pressure in patients who have symptoms like dizziness when going from a sitting to a standing position. This medicine may be used for other purposes; ask your health care provider or pharmacist if you have questions. COMMON BRAND NAME(S): Orvaten, ProAmatine What should I tell my health care provider before I take this medicine? They need to know if you have any of the following conditions: -difficulty passing urine -heart disease -high blood pressure -kidney disease -over active thyroid -pheochromocytoma -an unusual or allergic reaction to midodrine, other medicines, foods, dyes, or preservatives -pregnant or trying to get pregnant -breast-feeding How should I use this medicine? Take this medicine by mouth with a glass of water. Follow the directions on the prescription label. The last dose of this medicine should not be taken after the evening meal or less than 4 hours before bedtime. When you lie down for any length of time after taking this medicine, high blood pressure can occur. Do not take this medicine if you will be lying down for any length of time. Do not take your medicine more often than directed. Do not stop taking except on your doctor's advice. Talk to your pediatrician regarding the use of this medicine in children. Special care may be needed. Overdosage: If you think you have taken too much of this medicine contact a poison control center or emergency room at once. NOTE: This medicine is only for you. Do not share this medicine with others. What if I miss a dose? If you miss a dose, take it as soon as you can. If it is almost time for your next dose, take only that dose. Do not take double or extra doses. What may interact with this medicine? Do not take this medicine with any of the following medications: -MAOIs like Carbex, Eldepryl, Marplan, Nardil, and Parnate -medicines called ergot alkaloids -medicines for colds and breathing difficulties or weight  loss -procarbazine This medicine may also interact with the following medications: -cimetidine -digoxin -flecainide -fludrocortisone -metformin -procainamide -quinidine -ranitidine -triamterene -medicines called alpha-blockers like doxazosin, prazosin, and terazosin This list may not describe all possible interactions. Give your health care provider a list of all the medicines, herbs, non-prescription drugs, or dietary supplements you use. Also tell them if you smoke, drink alcohol, or use illegal drugs. Some items may interact with your medicine. What should I watch for while using this medicine? Visit your doctor or health care professional for regular checks on your progress. You may get drowsy or dizzy. Do not drive, use machinery, or  do anything that needs mental alertness until you know how this medicine affects you. Do not stand or sit up quickly, especially if you are an older patient. This reduces the risk of dizzy or fainting spells. Your mouth may get dry. Chewing sugarless gum or sucking hard candy, and drinking plenty of water may help. Contact your doctor if the problem does not go away or is severe. Do not treat yourself for coughs, colds, or pain while you are taking this medicine without asking your doctor or health care professional for advice. Some ingredients may increase your blood pressure. What side effects may I notice from receiving this medicine? Side effects that you should report to your doctor or health care professional as soon as possible: -awareness of heart beating -blurred vision -headache -irregular heartbeat, palpitations, or chest pain -pounding in the ears -skin rash, hives Side effects that usually do not require medical attention (report to your doctor or health care professional if they continue or are bothersome): -change in heart rate -chills -goose bumps -increased need to urinate -itching -stomach pain -tingling in the skin or scalp This  list may not describe all possible side effects. Call your doctor for medical advice about side effects. You may report side effects to FDA at 1-800-FDA-1088. Where should I keep my medicine? Keep out of the reach of children. Store at room temperature between 15 and 30 degrees C (59 and 86 degrees F). Throw away any unused medicine after the expiration date. NOTE: This sheet is a summary. It may not cover all possible information. If you have questions about this medicine, talk to your doctor, pharmacist, or health care provider.  2015, [employer] Standard. (2008-05-19 13:51:24)  Low-Sodium Eating Plan Sodium raises blood pressure and causes water to be held in the body. Getting less sodium from food will help lower your blood pressure, reduce any swelling, and protect your heart, liver, and kidneys. We get sodium by adding salt (sodium chloride) to food. Most of our sodium comes from canned, boxed, and frozen foods. Restaurant foods, fast foods, and pizza are also very high in sodium. Even if you take medicine to lower your blood pressure or to reduce fluid in your body, getting less sodium from your food is important. WHAT IS MY PLAN? Most people should limit their sodium intake to 2,300 mg a day. Your health care provider recommends that you limit your sodium intake to __________ a day.  WHAT DO I NEED TO KNOW ABOUT THIS EATING PLAN? For the low-sodium eating plan, you will follow these general guidelines:  Choose foods with a % Daily Value for sodium of less than 5% (as listed on the food label).   Use salt-free seasonings or herbs instead of table salt or sea salt.   Check with your health care provider or pharmacist before using salt substitutes.   Eat fresh foods.  Eat more vegetables and fruits.  Limit canned vegetables. If you do use them, rinse them well to decrease the sodium.   Limit cheese to 1 oz (28 g) per day.   Eat lower-sodium products, often labeled as "lower  sodium" or "no salt added."  Avoid foods that contain monosodium glutamate (MSG). MSG is sometimes added to [place] food and some canned foods.  Check food labels (Nutrition Facts labels) on foods to learn how much sodium is in one serving.  Eat more home-cooked food and less restaurant, buffet, and fast food.  When eating at a restaurant, ask that your food  be prepared with less salt or none, if possible.  HOW DO I READ FOOD LABELS FOR SODIUM INFORMATION? The Nutrition Facts label lists the amount of sodium in one serving of the food. If you eat more than one serving, you must multiply the listed amount of sodium by the number of servings. Food labels may also identify foods as:  Sodium free--Less than 5 mg in a serving.  Very low sodium--35 mg or less in a serving.  Low sodium--140 mg or less in a serving.  Light in sodium--50% less sodium in a serving. For example, if a food that usually has 300 mg of sodium is changed to become light in sodium, it will have 150 mg of sodium.  Reduced sodium--25% less sodium in a serving. For example, if a food that usually has 400 mg of sodium is changed to reduced sodium, it will have 300 mg of sodium. WHAT FOODS CAN I EAT? Grains Low-sodium cereals, including oats, puffed wheat and rice, and shredded wheat cereals. Low-sodium crackers. Unsalted rice and pasta. Lower-sodium bread.  Vegetables Frozen or fresh vegetables. Low-sodium or reduced-sodium canned vegetables. Low-sodium or reduced-sodium tomato sauce and paste. Low-sodium or reduced-sodium tomato and vegetable juices.  Fruits Fresh, frozen, and canned fruit. Fruit juice.  Meat and Other Protein Products Low-sodium canned tuna and salmon. Fresh or frozen meat, poultry, seafood, and fish. Lamb. Unsalted nuts. Dried beans, peas, and lentils without added salt. Unsalted canned beans. Homemade soups without salt. Eggs.  Dairy Milk. Soy milk. Ricotta cheese. Low-sodium or  reduced-sodium cheeses. Yogurt.  Condiments Fresh and dried herbs and spices. Salt-free seasonings. Onion and garlic powders. Low-sodium varieties of mustard and ketchup. Lemon juice.  Fats and Oils Reduced-sodium salad dressings. Unsalted butter.  Other Unsalted popcorn and pretzels.  The items listed above may not be a complete list of recommended foods or beverages. Contact your dietitian for more options. WHAT FOODS ARE NOT RECOMMENDED? Grains Instant hot cereals. Bread stuffing, pancake, and biscuit mixes. Croutons. Seasoned rice or pasta mixes. Noodle soup cups. Boxed or frozen macaroni and cheese. Self-rising flour. Regular salted crackers. Vegetables Regular canned vegetables. Regular canned tomato sauce and paste. Regular tomato and vegetable juices. Frozen vegetables in sauces. Salted french fries. Olives. Angie Fava. Relishes. Sauerkraut. Salsa. Meat and Other Protein Products Salted, canned, smoked, spiced, or pickled meats, seafood, or fish. Bacon, ham, sausage, hot dogs, corned beef, chipped beef, and packaged luncheon meats. Salt pork. Jerky. Pickled herring. Anchovies, regular canned tuna, and sardines. Salted nuts. Dairy Processed cheese and cheese spreads. Cheese curds. Blue cheese and cottage cheese. Buttermilk.  Condiments Onion and garlic salt, seasoned salt, table salt, and sea salt. Canned and packaged gravies. Worcestershire sauce. Tartar sauce. Barbecue sauce. Teriyaki sauce. Soy sauce, including reduced sodium. Steak sauce. Fish sauce. Oyster sauce. Cocktail sauce. Horseradish. Regular ketchup and mustard. Meat flavorings and tenderizers. Bouillon cubes. Hot sauce. Tabasco sauce. Marinades. Taco seasonings. Relishes. Fats and Oils Regular salad dressings. Salted butter. Margarine. Ghee. Bacon fat.  Other Potato and tortilla chips. Corn chips and puffs. Salted popcorn and pretzels. Canned or dried soups. Pizza. Frozen entrees and pot pies.  The items listed  above may not be a complete list of foods and beverages to avoid. Contact your dietitian for more information. Document Released: 04/22/2002 Document Revised: 11/05/2013 Document Reviewed: 09/04/2013 [facility] Patient Information 2015 [place], [place]. This information is not intended to replace advice given to you by your health care provider. Make sure you discuss any questions you have with  your health care provider.

## 2015-02-14 NOTE — Discharge Summary (Signed)
Discharge Summary   Patient ID: [name],  MRN: [id number], DOB/AGE: 1925/03/22 82 y.o.  Admit date: 02/10/2015 Discharge date: 02/14/2015  Primary Care Provider: [name] Primary Cardiologist: Dr. Ross/[facility]  Discharge Diagnoses Principal Problem:   Near syncope Active Problems:   Hyperlipidemia   Bradycardia, sinus   Hypothyroidism   Anemia   MGUS (monoclonal gammopathy of unknown significance)   Atrial fibrillation   Hypertension   Chest pain   CHF, stage C   Allergies Allergies  Allergen Reactions  . Tramadol Nausea Only    Hospital Course  The patient is a pleasant 81 year old Caucasian female with past medical history of diastolic heart failure, hypertension, hyperlipidemia, anemia, CKD, atrial fibrillation and history of nonobstructive CAD by cath in March 2015. She has been followed by both Dr. Harrington Challenger and heart failure clinic. Her last follow-up with the heart failure clinic in 01/2015 showed weight 137 pounds, euvolemic and was told to continue torsemide 40 mg twice a day with PRN metolazone for weight gain. She has been compliant with her medication and tries to be limited amount of sodium in her diet and has not had to take metolazone for several weeks. She presented to [facility] ED by EMS on 02/10/2015 for episodes of brief chest pain along with headache throughout the entire day. She also had near-syncope. While in the ER, she saw something moving on the wall which was not actually there.  She was admitted by cardiology service, overnight she had some hypotension during which she was asymptomatic except brief episode of chest discomfort lasting only a few seconds. Her BB was discontinued. A limited bedside echocardiogram done by cardiology fellow shows grossly normal biventricular systolic function, although RV appears to be in moderate to moderately enlarged. Given her persistent hypotension which has been refractory to fluid administration, she  was transferred to ICU. She was seen by pulmonary critical care service and feels hypotension is likely combination of beta blocker, dehydration and Eugenio Hoes her rate. She was started on midodrin for orthostatic hypotension. Her diuretic was initially held due to concern of dehydration, her Demadex was restarted on 3/31 with 40 mg daily. Patient was seen by physical therapy on 4/1 who recommended home health PT with intermittent supervision.  She was seen on the following day, at which time she denies any chest pain or shortness of breath. She is deemed stable for discharge from cardiology perspective. I have arranged home health PT and the nurse to help assist the patient. I will arrange close outpatient follow-up for the patient with heart failure clinic in one to 2 weeks.   Of note, important medication change during this admission include discontinuation of patient's metoprolol given her hypotension, addition of midodrine twice a day for orthostatic hypotension, cutting back on demadex from 40 mg BID to 40 mg daily along with potassium chloride supplement.   Discharge Vitals Blood pressure 108/54, pulse 73, temperature 97.7 F (36.5 C), temperature source Oral, resp. rate 18, height 4\' 11"  (1.499 m), weight 134 lb (60.782 kg), SpO2 97 %.  Filed Weights   02/12/15 0605 02/13/15 0420 02/14/15 0500  Weight: 138 lb 3.2 oz (62.687 kg) 138 lb 6.4 oz (62.778 kg) 134 lb (60.782 kg)    Labs  CBC  Recent Labs  02/12/15 0500  WBC 4.7  HGB 10.8*  HCT 34.3*  MCV 102.1*  PLT 676*   Basic Metabolic Panel  Recent Labs  02/13/15 0530 02/14/15 0401  NA 140 139  K 4.1  3.9  CL 108 105  CO2 28 30  GLUCOSE 90 99  BUN 24* 28*  CREATININE 1.91* 2.01*  CALCIUM 9.2 9.2    Disposition  Pt is being discharged home today in good condition.  Follow-up Plans & Appointments      Follow-up Information    Follow up with Glori Bickers, MD On 02/24/2015.   Specialty:  Cardiology   Why:  at 3:40  pm in the [facility]--gate code 0007--please bring all medications to appt   Contact information:   [place] [address] [phone]       Follow up with [facility].   Why:  home health nurse and physical therapy   Contact information:   [place] [address] [phone]       Discharge Medications    Medication List    STOP taking these medications        metoprolol tartrate 25 MG tablet  Commonly known as:  LOPRESSOR      TAKE these medications        acetaminophen 325 MG tablet  Commonly known as:  TYLENOL  Take 2 tablets (650 mg total) by mouth every 4 (four) hours as needed for headache or mild pain.     amiodarone 200 MG tablet  Commonly known as:  PACERONE  Take 1 tablet (200 mg total) by mouth daily.     apixaban 2.5 MG Tabs tablet  Commonly known as:  ELIQUIS  Take 1 tablet (2.5 mg total) by mouth 2 (two) times daily.  Notes to Patient:  TAKE ONE DOSE TONIGHT     ferrous sulfate 325 (65 FE) MG tablet  Take 1 tablet (325 mg total) by mouth 2 (two) times daily with a meal.  Notes to Patient:  TAKE ONE DOSE AT DINNER     levothyroxine 75 MCG tablet  Commonly known as:  SYNTHROID, LEVOTHROID  Take 1 tablet (75 mcg total) by mouth daily.     loratadine 10 MG tablet  Commonly known as:  CLARITIN  Take 10 mg by mouth daily as needed for allergies.     LORazepam 0.5 MG tablet  Commonly known as:  ATIVAN  Take 0.5-1 tablets (0.25-0.5 mg total) by mouth every 8 (eight) hours as needed for anxiety.     metolazone 2.5 MG tablet  Commonly known as:  ZAROXOLYN  Take 1 tablet (2.5 mg total) by mouth as needed (for weight 136 lb or greater).     midodrine 2.5 MG tablet  Commonly known as:  PROAMATINE  Take 1 tablet (2.5 mg total) by mouth 2 (two) times daily with a meal.  Notes to Patient:  Take one dose tonight     nitroGLYCERIN 0.4 MG SL tablet  Commonly known as:   NITROSTAT  Place 1 tablet (0.4 mg total) under the tongue every 5 (five) minutes as needed. For chest pain     pantoprazole 40 MG tablet  Commonly known as:  PROTONIX  Take 1 tablet (40 mg total) by mouth daily.     potassium chloride SA 20 MEQ tablet  Commonly known as:  KLOR-CON M20  Take 2 tablets (40 mEq total) by mouth daily.     senna 8.6 MG Tabs tablet  Commonly known as:  SENOKOT  Take 2 tablets (17.2 mg total) by mouth daily as needed for mild constipation.     simvastatin 10 MG tablet  Commonly known  as:  ZOCOR  Take 1 tablet (10 mg total) by mouth at bedtime.  Notes to Patient:  TAKE TONIGHT     torsemide 20 MG tablet  Commonly known as:  DEMADEX  Take 2 tablets (40 mg total) by mouth daily.        Duration of Discharge Encounter   Greater than 30 minutes including physician time.  Hilbert Corrigan PA-C Pager: [phone] 02/14/2015, 2:49 PM  Patient seen and examined. I agree with the assessment and plan as detailed above. See also my additional thoughts below.   The patient is stable. She is ready to go home. She is improved. I agree with the note in plans as outlined above.  Dola Argyle, MD, [facility] 02/14/2015 5:51 PM

## 2015-02-17 LAB — CULTURE, BLOOD (ROUTINE X 2)
Culture: NO GROWTH
Culture: NO GROWTH

## 2015-02-19 ENCOUNTER — Telehealth (HOSPITAL_COMMUNITY): Payer: Self-pay

## 2015-02-19 NOTE — Telephone Encounter (Addendum)
Magda Paganini RN from [facility] called to confirm stop in midodrine.  Also gave VO to recert for nursing home health.  Patient also had a severe nose bleed after blowing nose that took awhile to stop.  Advised she can hold PM dose of eliquis and resume tomorrow morning, and in meantime refrain from blowing nose or cleaning nose (had O2 in hospital that left nose dried out and irritated).  Aware and agreeable.  [name]

## 2015-02-19 NOTE — Telephone Encounter (Signed)
Patient called c/o higher SBP than normal.  Currently running 140-150s.  Looking in her chart, previous hospitalization notes mentioned SBP in 90's at that time with dizziness, and therefor her metoprolol 25mg  BID was stopped.  Mididrine 2.5mg  BID was also added at discharge.  Per Aundra Dubin, stop midodrine, kept check on BP daily, and patient to see [place] on the 12th for posthosp f/u.  Patient aware and agreeable.  [name]

## 2015-02-21 ENCOUNTER — Encounter: Payer: Self-pay | Admitting: Family Medicine

## 2015-02-21 ENCOUNTER — Ambulatory Visit (INDEPENDENT_AMBULATORY_CARE_PROVIDER_SITE_OTHER): Payer: Medicare Other | Admitting: Family Medicine

## 2015-02-21 VITALS — BP 124/72 | Temp 98.5°F | Wt 135.0 lb

## 2015-02-21 DIAGNOSIS — H1089 Other conjunctivitis: Secondary | ICD-10-CM

## 2015-02-21 DIAGNOSIS — A499 Bacterial infection, unspecified: Secondary | ICD-10-CM

## 2015-02-21 DIAGNOSIS — H109 Unspecified conjunctivitis: Secondary | ICD-10-CM

## 2015-02-21 MED ORDER — ERYTHROMYCIN 5 MG/GM OP OINT: 1.0000 "application " | TOPICAL_OINTMENT | Freq: Four times a day (QID) | OPHTHALMIC | Status: DC

## 2015-02-21 NOTE — Patient Instructions (Signed)
Yours appears to be bacterial given the thick green drainage.  See [place] if symptoms do not improve or if worsen or new symptoms (especially blurry vision)  Conjunctivitis Conjunctivitis is commonly called "pink eye." Conjunctivitis can be caused by bacterial or viral infection, allergies, or injuries. There is usually redness of the lining of the eye, itching, discomfort, and sometimes discharge. There may be deposits of matter along the eyelids. A viral infection usually causes a watery discharge, while a bacterial infection causes a yellowish, thick discharge. Pink eye is very contagious and spreads by direct contact. You may be given antibiotic eyedrops as part of your treatment. Before using your eye medicine, remove all drainage from the eye by washing gently with warm water and cotton balls. Continue to use the medication until you have awakened 2 mornings in a row without discharge from the eye. Do not rub your eye. This increases the irritation and helps spread infection. Use separate towels from other household members. Wash your hands with soap and water before and after touching your eyes. Use cold compresses to reduce pain and sunglasses to relieve irritation from light. Do not wear contact lenses or wear eye makeup until the infection is gone. SEEK MEDICAL CARE IF:   Your symptoms are not better after 3 days of treatment.  You have increased pain or trouble seeing.  The outer eyelids become very red or swollen. Document Released: 12/08/2004 Document Revised: 01/23/2012 Document Reviewed: 10/31/2005 [employer] Patient Information 2015 [place], [place]. This information is not intended to replace advice given to you by your health care provider. Make sure you discuss any questions you have with your health care provider.

## 2015-02-21 NOTE — Progress Notes (Signed)
[facility] PCP: [name], MD  Subjective:  [name] is a 81 y.o. year old very pleasant female patient who presents with red eye with discharge on the right Symptoms started 5 days ago. R eye has been red with thick green drainage. Was hospitalized when it started and asked for evaluation in hospital but was told needed primary care follow up. Discharge summary not available. Eye has become progressively red and with more discharge. She has to clean it out multiple times a day with a warm washcloth. Eyes stuck closed in AM. Tried visine which did not help. No recent cold symptoms.   ROS- no increased blurry vision from baseline, no changes in vision, no pain, minimal itch.   Pertinent Past Medical History- atrial fibrillation on eliquis, DM, CKD, HLD  Medications- reviewed but patient states she has no idea what she is taking and did not bring meds with her.  Current Outpatient Prescriptions  Medication Sig Dispense Refill  . amiodarone (PACERONE) 200 MG tablet Take 1 tablet (200 mg total) by mouth daily. 30 tablet 6  . apixaban (ELIQUIS) 2.5 MG TABS tablet Take 1 tablet (2.5 mg total) by mouth 2 (two) times daily. 60 tablet 6  . ferrous sulfate 325 (65 FE) MG tablet Take 1 tablet (325 mg total) by mouth 2 (two) times daily with a meal. 180 tablet 1  . levothyroxine (SYNTHROID, LEVOTHROID) 75 MCG tablet Take 1 tablet (75 mcg total) by mouth daily. 90 tablet 1  . loratadine (CLARITIN) 10 MG tablet Take 10 mg by mouth daily as needed for allergies.     . metolazone (ZAROXOLYN) 2.5 MG tablet Take 1 tablet (2.5 mg total) by mouth as needed (for weight 136 lb or greater). 10 tablet 3  . pantoprazole (PROTONIX) 40 MG tablet Take 1 tablet (40 mg total) by mouth daily. 30 tablet 5  . potassium chloride SA (KLOR-CON M20) 20 MEQ tablet Take 2 tablets (40 mEq total) by mouth daily.    Marland Kitchen senna (SENOKOT) 8.6 MG TABS tablet Take 2 tablets (17.2 mg total) by  mouth daily as needed for mild constipation. 30 tablet 6  . simvastatin (ZOCOR) 10 MG tablet Take 1 tablet (10 mg total) by mouth at bedtime. 30 tablet 6  . torsemide (DEMADEX) 20 MG tablet Take 2 tablets (40 mg total) by mouth daily.    Marland Kitchen acetaminophen (TYLENOL) 325 MG tablet Take 2 tablets (650 mg total) by mouth every 4 (four) hours as needed for headache or mild pain. (Patient not taking: Reported on 02/21/2015)    . LORazepam (ATIVAN) 0.5 MG tablet Take 0.5-1 tablets (0.25-0.5 mg total) by mouth every 8 (eight) hours as needed for anxiety. (Patient not taking: Reported on 02/21/2015) 30 tablet 1  . nitroGLYCERIN (NITROSTAT) 0.4 MG SL tablet Place 1 tablet (0.4 mg total) under the tongue every 5 (five) minutes as needed. For chest pain (Patient not taking: Reported on 02/21/2015) 25 tablet 6   No current facility-administered medications for this visit.    Objective: BP 124/72 mmHg  Temp(Src) 98.5 F (36.9 C)  Wt 135 lb (61.236 kg) Gen: NAD, resting comfortably  Left eye normal  Right eye with scleral and conjunctival erythema, green discharge noted during exam.   PERRLA  HEENT: Turbinates normal, oropharynx normal, no sinus pressure  Skin: warm, dry, no rash over face Neuro: grossly normal, moves all extremities, EOMI  Assessment/Plan: R Bacterial conjunctivitis Antibiotic ointment as below. Return precautions advised. See AVS.  No red flags today such as pain or change in vision. Unable to locate eye kit in [facility] but would likely be low yield fluorescin eye stain  Meds ordered this encounter  Medications  . erythromycin ophthalmic ointment    Sig: Place 1 application into the right eye 4 (four) times daily. One-half inch (1.25 cm) four times daily for 5 to 7 days    Dispense:  3.5 g    Refill:  0

## 2015-02-24 ENCOUNTER — Encounter (HOSPITAL_COMMUNITY): Payer: Medicare Other

## 2015-02-25 ENCOUNTER — Telehealth (HOSPITAL_COMMUNITY): Payer: Self-pay

## 2015-02-25 NOTE — Telephone Encounter (Signed)
error 

## 2015-03-04 ENCOUNTER — Ambulatory Visit (HOSPITAL_COMMUNITY)
Admission: RE | Admit: 2015-03-04 | Discharge: 2015-03-04 | Disposition: A | Discharge status: Home / Self Care | Payer: Medicare Other | Source: Ambulatory Visit | Attending: Internal Medicine | Admitting: Internal Medicine

## 2015-03-04 ENCOUNTER — Encounter (HOSPITAL_COMMUNITY): Payer: Self-pay

## 2015-03-04 VITALS — BP 118/68 | HR 70 | Wt 137.8 lb

## 2015-03-04 DIAGNOSIS — I5032 Chronic diastolic (congestive) heart failure: Secondary | ICD-10-CM | POA: Insufficient documentation

## 2015-03-04 DIAGNOSIS — N183 Chronic kidney disease, stage 3 unspecified: Secondary | ICD-10-CM

## 2015-03-04 DIAGNOSIS — I480 Paroxysmal atrial fibrillation: Secondary | ICD-10-CM

## 2015-03-04 LAB — BASIC METABOLIC PANEL
Anion gap: 10 (ref 5–15)
BUN: 33 mg/dL — ABNORMAL HIGH (ref 6–23)
CO2: 30 mmol/L (ref 19–32)
Calcium: 9.5 mg/dL (ref 8.4–10.5)
Chloride: 102 mmol/L (ref 96–112)
Creatinine, Ser: 1.85 mg/dL — ABNORMAL HIGH (ref 0.50–1.10)
GFR calc non Af Amer: 23 mL/min — ABNORMAL LOW (ref >90)
GFR, EST AFRICAN AMERICAN: 27 mL/min — AB (ref >90)
Glucose, Bld: 99 mg/dL (ref 70–99)
POTASSIUM: 4 mmol/L (ref 3.5–5.1)
SODIUM: 142 mmol/L (ref 135–145)

## 2015-03-04 NOTE — Addendum Note (Signed)
Encounter addended by: Scarlette Calico, RN on: 03/04/2015  3:28 PM<BR>     Documentation filed: Dx Association, Patient Instructions Section, Orders

## 2015-03-04 NOTE — Progress Notes (Signed)
Patient ID: [name], female   DOB: 1925-11-09, 81 y.o.   MRN: [id number]  PCP: Dr Netty Starring Primary Cardiologist: Dr. Harrington Challenger  HPI: [name] is a 81 y.o. female with the history of nonobstructive CAD by cardiac catheterization in 01/2014, persistent atrial fibrillation, diastolic CHF, HTN, HL, anemia, and CKD. Patient was admitted 04/2014 x2 with acute on chronic diastolic CHF. She underwent cardioversion with restoration of NSR. This resulted in profound bradycardia and hypotension requiring pacemaker implantation. Pacemaker implant was complicated by acute blood loss anemia requiring transfusion with PRBCs  Admitted to [facility] August 21 with increased dyspnea. Diuresed with IV lasix and transitioned to po lasix 40 mg twice a day. GI evaluated due to anemia and black stool. Had EGD with no acute findings. Colonoscopy was cancelled per daughter due to multiple medical problems. She continued on eliquis 2.5 mg twice a day.  Palliative Care consulted and the patient and family request aggressive care for heart failure. Discharge weight was 152 pounds.   Admitted to [facility] 11/9 after RHC due to elevated filling pressures. Once diuresed she was put on torsemide 20 mg twice a day. She was also restarted on amiodarone 200 mg daily and continue on eliquis 2.5 mg twice a day. Discharge weight was 141 pounds.   Admitted 12/12 to 12/17 with recurrent HF and AF. Admit weight was 136. Was diuresed to 131. Diuresis limited by low BP. Demadex increased from 20 bid to 40 bid.   Admitted 12/06/14 - 12/09/14 for ADHF. Initial weight 134 pounds. Diuresed down to 130 and felt better. Treatment limited due to hypotension and worsening renal failure with creatinine up to 2.6. Renal u/s ok. Metolazone stopped.  Admitted in 3/16 with hypotension. Started on midodrine 2.5 bid. Demadex cut back from 40 bid to 40 daily.   She return for follow up: Here with daughter. About 2 weeks ago midodrine stopped due to SBP 140-150  range. SBP running 115-140. Weight has been very stable 130-131. Up 2 pounds today because she ate [place] food last night.  Denies SOB.. No orthopnea or PND. Tolerating Eliquis Studies:  - LHC (3/15): Mid LM 20-30%, mid RCA 30-40%, EF 65%  - Echo (4/15): Mild LVH, EF 60-65%, no RWMA, mild BAE, trivial eff  - Carotid US (5/15): 40-59% RICA stenosis. 3-21% LICA stenosis.   RHC 09/22/14  RA = 11 RV = 49/6/10 PA = 49/22 (34) PCW = 18 (v = 25) Fick cardiac output/index = 6.4/4.0 PVR = 2.5 WU FA sat = 95% PA sat = 72%,74%  Labs 07/18/14 K 3.5 Creatinine 1.44 Hgb 10.7  Labs 07/14/14 K 4.1 Creatinine 1.46  Hgb 9.8  Labs 07/04/14 TSH 2.7 Labs 09/06/14: K 4.0, creatinine 1.93 Labs 09/15/14 K 4.6 Creatinine 3.9  Labs 09/26/14 K 3.6 Creatinine 1.61 hgb 13.7 Labs 10/01/14 K 4.4 Creatinine 1.7 Hgb 14/1  Labs 10/30/14 K 3.9 Creatinine 1.4  Labs 01/12/2015: K 4.2 Creatinine 2.22  ROS: All systems negative except as listed in HPI, PMH and Problem List.  Past Medical History  Diagnosis Date  . Dyslipidemia   . Hypertension   . Palpitations     PVCs/bigeminy on event in May 2009 revealing this was relatively asymptomatic  . Chest pain     a. 2011 Neg MV;  b. 01/2014 Cath: LM 20-30, LAD nl, D1 nl, LCX nl, OM1 nl, RCA dom 30-70m, PD/PL nl, EF 65%->Med Rx.  . Degenerative joint disease   . Hx of varicella   . History  of skin cancer     tafeen/ non melanoma.   . Thrombocytopenia   . Anemia   . Monoclonal gammopathies   . Right lower quadrant abdominal pain 07/19/2012    Recurrent  With nausea    This is new since she had ct for llq pain in [facility]    R/o appendiceal problem  Hernia  Get ct scan  And plan  Fu     . Diastolic CHF, acute on chronic 01/31/2012  . Atrial fibrillation     a. Dx 01/2014->Eliquis started.  . Cancer   . Pacemaker   . Dysrhythmia     Current Outpatient Prescriptions  Medication Sig Dispense Refill  . acetaminophen (TYLENOL) 325 MG tablet Take 2 tablets (650 mg total) by  mouth every 4 (four) hours as needed for headache or mild pain.    Marland Kitchen amiodarone (PACERONE) 200 MG tablet Take 1 tablet (200 mg total) by mouth daily. 30 tablet 6  . apixaban (ELIQUIS) 2.5 MG TABS tablet Take 1 tablet (2.5 mg total) by mouth 2 (two) times daily. 60 tablet 6  . erythromycin ophthalmic ointment Place 1 application into the right eye 4 (four) times daily. One-half inch (1.25 cm) four times daily for 5 to 7 days 3.5 g 0  . ferrous sulfate 325 (65 FE) MG tablet Take 1 tablet (325 mg total) by mouth 2 (two) times daily with a meal. 180 tablet 1  . levothyroxine (SYNTHROID, LEVOTHROID) 75 MCG tablet Take 1 tablet (75 mcg total) by mouth daily. 90 tablet 1  . loratadine (CLARITIN) 10 MG tablet Take 10 mg by mouth daily as needed for allergies.     Marland Kitchen LORazepam (ATIVAN) 0.5 MG tablet Take 0.5-1 tablets (0.25-0.5 mg total) by mouth every 8 (eight) hours as needed for anxiety. 30 tablet 1  . metolazone (ZAROXOLYN) 2.5 MG tablet Take 1 tablet (2.5 mg total) by mouth as needed (for weight 136 lb or greater). 10 tablet 3  . nitroGLYCERIN (NITROSTAT) 0.4 MG SL tablet Place 1 tablet (0.4 mg total) under the tongue every 5 (five) minutes as needed. For chest pain 25 tablet 6  . pantoprazole (PROTONIX) 40 MG tablet Take 1 tablet (40 mg total) by mouth daily. 30 tablet 5  . potassium chloride SA (KLOR-CON M20) 20 MEQ tablet Take 2 tablets (40 mEq total) by mouth daily.    Marland Kitchen senna (SENOKOT) 8.6 MG TABS tablet Take 2 tablets (17.2 mg total) by mouth daily as needed for mild constipation. 30 tablet 6  . simvastatin (ZOCOR) 10 MG tablet Take 1 tablet (10 mg total) by mouth at bedtime. 30 tablet 6  . torsemide (DEMADEX) 20 MG tablet Take 2 tablets (40 mg total) by mouth daily.     No current facility-administered medications for this encounter.      Filed Vitals:   03/04/15 1448  BP: 118/68  Pulse: 70  Weight: 137 lb 12.8 oz (62.506 kg)  SpO2: 98%     PHYSICAL EXAM: General:  Elderly Frail  appearing. No resp difficulty. Daughter present HEENT: normal except  Neck: supple. JVP 6-7 Carotids 2+ bilaterally; no bruits. No lymphadenopathy or thryomegaly appreciated. Cor: PMI normal. Regular  rate &  rhythm. No rubs, gallops or murmurs. Lungs: clear Abdomen: soft, nontender, nondistended. No hepatosplenomegaly. No bruits or masses. Good bowel sounds. Extremities: no cyanosis, clubbing, rash, R and LLE severe varicose veins no edema.   Neuro: alert & orientedx3, cranial nerves grossly intact. Moves all 4 extremities w/o difficulty.  Affect pleasant.   ASSESSMENT & PLAN:  1. Chronic Diastolic Heart Failure - She has a very narrow euvolemic window. Today she is on the upper end of her range.   -Continue torsemide 40 mg once a day.  Take metolazone only for weight 135 or greater.  I reviewed BP and weight from [place].   Reinforced the need and importance of daily weights, a low sodium diet, and fluid restriction (less than 2 L a day). Instructed to call the HF clinic if weight increases more than 3 lbs overnight or 5 lbs in a week.  2.PAF -  symptomatic bradycardia s/p PPM.  Peviously thought to have failed amiodarone and had chronic AF. However in November in the hospital she was in NSR so amio restarted. - Continue amiodarone 200 mg daily. Continue Eliquis 2.5 mg twice a day. No bleeding problems.   3. Hypotension - Improved. Now off midodrine. Can restart as needed.  4. CKD stage III - Reviewed BMET form 01/12/2015 Renal function ok. Will recheck today.  5. Deconditioning- Continue HHPT.   Follow up in 2 months.    Glori Bickers MD  3:08 PM

## 2015-03-04 NOTE — Patient Instructions (Signed)
Lab today  We will contact you in 2 months to schedule your next appointment.

## 2015-03-17 ENCOUNTER — Encounter (HOSPITAL_COMMUNITY): Payer: Self-pay

## 2015-03-17 ENCOUNTER — Observation Stay (HOSPITAL_COMMUNITY)
Admission: EM | Admit: 2015-03-17 | Discharge: 2015-03-19 | Disposition: A | Discharge status: Home / Self Care | Payer: Medicare Other | Attending: Internal Medicine | Admitting: Internal Medicine

## 2015-03-17 ENCOUNTER — Emergency Department (HOSPITAL_COMMUNITY): Payer: Medicare Other

## 2015-03-17 DIAGNOSIS — I129 Hypertensive chronic kidney disease with stage 1 through stage 4 chronic kidney disease, or unspecified chronic kidney disease: Secondary | ICD-10-CM | POA: Diagnosis not present

## 2015-03-17 DIAGNOSIS — I480 Paroxysmal atrial fibrillation: Secondary | ICD-10-CM | POA: Diagnosis not present

## 2015-03-17 DIAGNOSIS — Z886 Allergy status to analgesic agent status: Secondary | ICD-10-CM | POA: Insufficient documentation

## 2015-03-17 DIAGNOSIS — D649 Anemia, unspecified: Secondary | ICD-10-CM | POA: Diagnosis not present

## 2015-03-17 DIAGNOSIS — I959 Hypotension, unspecified: Secondary | ICD-10-CM | POA: Diagnosis not present

## 2015-03-17 DIAGNOSIS — Z961 Presence of intraocular lens: Secondary | ICD-10-CM | POA: Insufficient documentation

## 2015-03-17 DIAGNOSIS — Z85828 Personal history of other malignant neoplasm of skin: Secondary | ICD-10-CM | POA: Diagnosis not present

## 2015-03-17 DIAGNOSIS — M1990 Unspecified osteoarthritis, unspecified site: Secondary | ICD-10-CM | POA: Insufficient documentation

## 2015-03-17 DIAGNOSIS — E785 Hyperlipidemia, unspecified: Secondary | ICD-10-CM | POA: Insufficient documentation

## 2015-03-17 DIAGNOSIS — Z9842 Cataract extraction status, left eye: Secondary | ICD-10-CM | POA: Insufficient documentation

## 2015-03-17 DIAGNOSIS — Z9841 Cataract extraction status, right eye: Secondary | ICD-10-CM | POA: Diagnosis not present

## 2015-03-17 DIAGNOSIS — R079 Chest pain, unspecified: Secondary | ICD-10-CM | POA: Diagnosis not present

## 2015-03-17 DIAGNOSIS — Z9049 Acquired absence of other specified parts of digestive tract: Secondary | ICD-10-CM | POA: Insufficient documentation

## 2015-03-17 DIAGNOSIS — I5032 Chronic diastolic (congestive) heart failure: Secondary | ICD-10-CM

## 2015-03-17 DIAGNOSIS — N183 Chronic kidney disease, stage 3 (moderate): Secondary | ICD-10-CM

## 2015-03-17 DIAGNOSIS — N189 Chronic kidney disease, unspecified: Secondary | ICD-10-CM | POA: Diagnosis not present

## 2015-03-17 DIAGNOSIS — Z950 Presence of cardiac pacemaker: Secondary | ICD-10-CM | POA: Diagnosis not present

## 2015-03-17 DIAGNOSIS — R7989 Other specified abnormal findings of blood chemistry: Secondary | ICD-10-CM

## 2015-03-17 DIAGNOSIS — R0789 Other chest pain: Secondary | ICD-10-CM

## 2015-03-17 HISTORY — DX: Family history of other specified conditions: Z84.89

## 2015-03-17 LAB — CBC WITH DIFFERENTIAL/PLATELET
BASOS PCT: 0 % (ref 0–1)
Basophils Absolute: 0 10*3/uL (ref 0.0–0.1)
EOS ABS: 0.1 10*3/uL (ref 0.0–0.7)
Eosinophils Relative: 1 % (ref 0–5)
HEMATOCRIT: 37.9 % (ref 36.0–46.0)
Hemoglobin: 12.3 g/dL (ref 12.0–15.0)
LYMPHS ABS: 0.6 10*3/uL — AB (ref 0.7–4.0)
Lymphocytes Relative: 5 % — ABNORMAL LOW (ref 12–46)
MCH: 31.7 pg (ref 26.0–34.0)
MCHC: 32.5 g/dL (ref 30.0–36.0)
MCV: 97.7 fL (ref 78.0–100.0)
MONO ABS: 0.7 10*3/uL (ref 0.1–1.0)
Monocytes Relative: 7 % (ref 3–12)
Neutro Abs: 9.3 10*3/uL — ABNORMAL HIGH (ref 1.7–7.7)
Neutrophils Relative %: 87 % — ABNORMAL HIGH (ref 43–77)
Platelets: 122 10*3/uL — ABNORMAL LOW (ref 150–400)
RBC: 3.88 MIL/uL (ref 3.87–5.11)
RDW: 13.2 % (ref 11.5–15.5)
WBC: 10.6 10*3/uL — ABNORMAL HIGH (ref 4.0–10.5)

## 2015-03-17 LAB — COMPREHENSIVE METABOLIC PANEL
ALT: 16 U/L (ref 14–54)
AST: 19 U/L (ref 15–41)
Albumin: 3.5 g/dL (ref 3.5–5.0)
Alkaline Phosphatase: 108 U/L (ref 38–126)
Anion gap: 13 (ref 5–15)
BUN: 38 mg/dL — AB (ref 6–20)
CALCIUM: 9.3 mg/dL (ref 8.9–10.3)
CO2: 28 mmol/L (ref 22–32)
Chloride: 100 mmol/L — ABNORMAL LOW (ref 101–111)
Creatinine, Ser: 2.08 mg/dL — ABNORMAL HIGH (ref 0.44–1.00)
GFR calc Af Amer: 23 mL/min — ABNORMAL LOW (ref >60)
GFR, EST NON AFRICAN AMERICAN: 20 mL/min — AB (ref >60)
Glucose, Bld: 148 mg/dL — ABNORMAL HIGH (ref 70–99)
Potassium: 3.6 mmol/L (ref 3.5–5.1)
Sodium: 141 mmol/L (ref 135–145)
TOTAL PROTEIN: 6.6 g/dL (ref 6.5–8.1)
Total Bilirubin: 0.5 mg/dL (ref 0.3–1.2)

## 2015-03-17 LAB — TROPONIN I
TROPONIN I: 0.05 ng/mL — AB (ref <0.031)
TROPONIN I: 0.05 ng/mL — AB (ref <0.031)
Troponin I: 0.05 ng/mL — ABNORMAL HIGH (ref <0.031)
Troponin I: 0.05 ng/mL — ABNORMAL HIGH (ref <0.031)

## 2015-03-17 LAB — LIPASE, BLOOD: LIPASE: 40 U/L (ref 22–51)

## 2015-03-17 MED ORDER — ONDANSETRON HCL 4 MG/2ML IJ SOLN: 4.0000 mg | Freq: Four times a day (QID) | INTRAMUSCULAR | Status: DC | PRN

## 2015-03-17 MED ORDER — POTASSIUM CHLORIDE CRYS ER 20 MEQ PO TBCR
40.0000 meq | EXTENDED_RELEASE_TABLET | Freq: Every day | ORAL | Status: DC
  Administered 2015-03-17 – 2015-03-18 (×2): 40 meq via ORAL
  Filled 2015-03-17 (×3): qty 2

## 2015-03-17 MED ORDER — SODIUM CHLORIDE 0.9 % IV BOLUS (SEPSIS)
250.0000 mL | Freq: Once | INTRAVENOUS | Status: AC
  Administered 2015-03-17: 250 mL via INTRAVENOUS

## 2015-03-17 MED ORDER — GI COCKTAIL ~~LOC~~
30.0000 mL | Freq: Four times a day (QID) | ORAL | Status: DC | PRN
Start: 2015-03-17 — End: 2015-03-19
  Filled 2015-03-17: qty 30

## 2015-03-17 MED ORDER — APIXABAN 2.5 MG PO TABS
2.5000 mg | ORAL_TABLET | Freq: Two times a day (BID) | ORAL | Status: DC
  Administered 2015-03-17 – 2015-03-18 (×4): 2.5 mg via ORAL
  Filled 2015-03-17 (×6): qty 1

## 2015-03-17 MED ORDER — LEVOTHYROXINE SODIUM 75 MCG PO TABS
75.0000 ug | ORAL_TABLET | Freq: Every day | ORAL | Status: DC
  Administered 2015-03-17 – 2015-03-19 (×3): 75 ug via ORAL
  Filled 2015-03-17 (×4): qty 1

## 2015-03-17 MED ORDER — FERROUS SULFATE 325 (65 FE) MG PO TABS
325.0000 mg | ORAL_TABLET | Freq: Two times a day (BID) | ORAL | Status: DC
  Administered 2015-03-17 – 2015-03-19 (×5): 325 mg via ORAL
  Filled 2015-03-17 (×7): qty 1

## 2015-03-17 MED ORDER — SENNA 8.6 MG PO TABS
2.0000 | ORAL_TABLET | Freq: Every day | ORAL | Status: DC | PRN
  Filled 2015-03-17: qty 2

## 2015-03-17 MED ORDER — AMIODARONE HCL 200 MG PO TABS
200.0000 mg | ORAL_TABLET | Freq: Every day | ORAL | Status: DC
  Administered 2015-03-17 – 2015-03-18 (×2): 200 mg via ORAL
  Filled 2015-03-17 (×3): qty 1

## 2015-03-17 MED ORDER — LORATADINE 10 MG PO TABS
10.0000 mg | ORAL_TABLET | Freq: Every day | ORAL | Status: DC | PRN
  Filled 2015-03-17: qty 1

## 2015-03-17 MED ORDER — ACETAMINOPHEN 325 MG PO TABS
650.0000 mg | ORAL_TABLET | ORAL | Status: DC | PRN
  Administered 2015-03-18 – 2015-03-19 (×2): 650 mg via ORAL
  Filled 2015-03-17 (×2): qty 2

## 2015-03-17 MED ORDER — SIMVASTATIN 10 MG PO TABS
10.0000 mg | ORAL_TABLET | Freq: Every day | ORAL | Status: DC
  Administered 2015-03-17 – 2015-03-18 (×2): 10 mg via ORAL
  Filled 2015-03-17 (×3): qty 1

## 2015-03-17 MED ORDER — TORSEMIDE 20 MG PO TABS
40.0000 mg | ORAL_TABLET | Freq: Every day | ORAL | Status: DC
  Administered 2015-03-17 – 2015-03-18 (×2): 40 mg via ORAL
  Filled 2015-03-17 (×3): qty 2

## 2015-03-17 MED ORDER — PANTOPRAZOLE SODIUM 40 MG PO TBEC
40.0000 mg | DELAYED_RELEASE_TABLET | Freq: Every day | ORAL | Status: DC
  Administered 2015-03-17 – 2015-03-18 (×2): 40 mg via ORAL
  Filled 2015-03-17 (×2): qty 1

## 2015-03-17 NOTE — ED Provider Notes (Signed)
CSN: [id number]     Arrival date & time 03/17/15  0057 History   First MD Initiated Contact with Patient 03/17/15 0106     This chart was scribed for Linda Rice, MD by Forrestine Him, ED Scribe. This patient was seen in room [facility] and the patient's care was started 1:13 AM.   No chief complaint on file.  HPI  HPI Comments: [name] is a 81 y.o. female with a PMHx of dyslipidemia, HTN, degenerative joint disease, CHF, implanted pacemaker, and A-Fib who presents to the Emergency Department complaining of constant, ongoing, unchanged R sided chest pain x 2 hours. Pain is not worsened with deep breathing. She also reports pain under R breast that radiates into chest along with nausea and 2 episodes of vomiting. [name] was given nausea medication en route to department with improvement. No recent fever, chills, dizziness, leg swelling, leg pain, or abdominal pain. She is followed by Dr. Glori Bickers. Pt with known allergy to Tramadol.  Past Medical History  Diagnosis Date  . Dyslipidemia   . Hypertension   . Palpitations     PVCs/bigeminy on event in May 2009 revealing this was relatively asymptomatic  . Chest pain     a. 2011 Neg MV;  b. 01/2014 Cath: LM 20-30, LAD nl, D1 nl, LCX nl, OM1 nl, RCA dom 30-33m, PD/PL nl, EF 65%->Med Rx.  . Degenerative joint disease   . Hx of varicella   . History of skin cancer     tafeen/ non melanoma.   . Thrombocytopenia   . Anemia   . Monoclonal gammopathies   . Right lower quadrant abdominal pain 07/19/2012    Recurrent  With nausea    This is new since she had ct for llq pain in [facility]    R/o appendiceal problem  Hernia  Get ct scan  And plan  Fu     . Diastolic CHF, acute on chronic 01/31/2012  . Atrial fibrillation     a. Dx 01/2014->Eliquis started.  . Cancer   . Pacemaker   . Dysrhythmia    Past Surgical History  Procedure Laterality Date  . Cholecystectomy  1999  . Cesarean section      times 2  . Shoulder surgery   1996    Right   . Cataract extraction      Bilateral  implantt  . Cardioversion N/A 02/26/2014    Procedure: CARDIOVERSION AT BEDSIDE;  Surgeon: Pixie Casino, MD;  Location: [place];  Service: Cardiovascular;  Laterality: N/A;  . Cardioversion N/A 04/22/2014    Procedure: CARDIOVERSION (BEDSIDE);  Surgeon: [name], MD;  Location: [facility] OR;  Service: Cardiovascular;  Laterality: N/A;  . Pacemaker insertion  04/23/2014    STJ Assurity dual chamber pacemaker implanted by Dr Rayann Heman  . Insert / replace / remove pacemaker    . Esophagogastroduodenoscopy N/A 07/06/2014    Procedure: ESOPHAGOGASTRODUODENOSCOPY (EGD);  Surgeon: Ladene Artist, MD;  Location: [facility] ENDOSCOPY;  Service: Endoscopy;  Laterality: N/A;  . Left heart catheterization with coronary angiogram N/A 01/29/2014    Procedure: LEFT HEART CATHETERIZATION WITH CORONARY ANGIOGRAM;  Surgeon: Blane Ohara, MD;  Location: [facility] CATH LAB;  Service: Cardiovascular;  Laterality: N/A;  . Permanent pacemaker insertion N/A 04/23/2014    Procedure: PERMANENT PACEMAKER INSERTION;  Surgeon: Evans Lance, MD;  Location: [facility] CATH LAB;  Service: Cardiovascular;  Laterality: N/A;  . Right heart catheterization N/A 09/22/2014    Procedure: RIGHT HEART CATH;  Surgeon: Jolaine Artist, MD;  Location: [facility] CATH LAB;  Service: Cardiovascular;  Laterality: N/A;   Family History  Problem Relation Age of Onset  . Coronary artery disease Father   . Heart disease Father   . Lung cancer    . Alzheimer's disease Mother   . Cancer Brother 38    lung cancer  . Heart attack Father    History  Substance Use Topics  . Smoking status: Never Smoker   . Smokeless tobacco: Never Used  . Alcohol Use: No   OB History    No data available     Review of Systems  Constitutional: Negative for fever and chills.  Respiratory: Negative for cough and shortness of breath.   Cardiovascular: Positive for chest pain.  Gastrointestinal: Positive for nausea and  vomiting. Negative for abdominal pain.  Musculoskeletal: Negative for back pain, neck pain and neck stiffness.  Skin: Negative for rash and wound.  Neurological: Negative for dizziness, weakness, light-headedness, numbness and headaches.  Psychiatric/Behavioral: Negative for confusion.  All other systems reviewed and are negative.     Allergies  Tramadol  Home Medications   Prior to Admission medications   Medication Sig Start Date End Date Taking? Authorizing Provider  acetaminophen (TYLENOL) 325 MG tablet Take 2 tablets (650 mg total) by mouth every 4 (four) hours as needed for headache or mild pain. 09/26/14   Isaiah Serge, NP  amiodarone (PACERONE) 200 MG tablet Take 1 tablet (200 mg total) by mouth daily. 11/06/14   Jolaine Artist, MD  apixaban (ELIQUIS) 2.5 MG TABS tablet Take 1 tablet (2.5 mg total) by mouth 2 (two) times daily. 12/30/14   Jolaine Artist, MD  erythromycin ophthalmic ointment Place 1 application into the right eye 4 (four) times daily. One-half inch (1.25 cm) four times daily for 5 to 7 days 02/21/15   Marin Olp, MD  ferrous sulfate 325 (65 FE) MG tablet Take 1 tablet (325 mg total) by mouth 2 (two) times daily with a meal. 11/21/14   Burnis Medin, MD  levothyroxine (SYNTHROID, LEVOTHROID) 75 MCG tablet Take 1 tablet (75 mcg total) by mouth daily. 11/21/14   Burnis Medin, MD  loratadine (CLARITIN) 10 MG tablet Take 10 mg by mouth daily as needed for allergies.     Historical Provider, MD  LORazepam (ATIVAN) 0.5 MG tablet Take 0.5-1 tablets (0.25-0.5 mg total) by mouth every 8 (eight) hours as needed for anxiety. 09/09/14   Burnis Medin, MD  metolazone (ZAROXOLYN) 2.5 MG tablet Take 1 tablet (2.5 mg total) by mouth as needed (for weight 136 lb or greater). 12/16/14   Jolaine Artist, MD  nitroGLYCERIN (NITROSTAT) 0.4 MG SL tablet Place 1 tablet (0.4 mg total) under the tongue every 5 (five) minutes as needed. For chest pain 12/02/13   Fay Records, MD   pantoprazole (PROTONIX) 40 MG tablet Take 1 tablet (40 mg total) by mouth daily. 08/11/14   Burnis Medin, MD  potassium chloride SA (KLOR-CON M20) 20 MEQ tablet Take 2 tablets (40 mEq total) by mouth daily. 02/14/15   Almyra Deforest, PA  senna (SENOKOT) 8.6 MG TABS tablet Take 2 tablets (17.2 mg total) by mouth daily as needed for mild constipation. 09/26/14   Isaiah Serge, NP  simvastatin (ZOCOR) 10 MG tablet Take 1 tablet (10 mg total) by mouth at bedtime. 11/06/14   Jolaine Artist, MD  torsemide (DEMADEX) 20 MG tablet Take 2 tablets (40  mg total) by mouth daily. 02/14/15   Almyra Deforest, PA   Triage Vitals: BP 117/51 mmHg  Pulse 75  Temp(Src) 97.5 F (36.4 C) (Oral)  Resp 20  SpO2 95%   Physical Exam  Constitutional: She is oriented to person, place, and time. She appears well-developed and well-nourished. No distress.  HENT:  Head: Normocephalic and atraumatic.  Mouth/Throat: Oropharynx is clear and moist.  Eyes: EOM are normal. Pupils are equal, round, and reactive to light.  Neck: Normal range of motion. Neck supple.  Cardiovascular: Normal rate and regular rhythm.   Pulmonary/Chest: Effort normal and breath sounds normal. No respiratory distress. She has no wheezes. She has no rales. She exhibits tenderness (epigastric tenderness and tenderness of the right breast.).  Abdominal: Soft. Bowel sounds are normal. She exhibits no distension and no mass. There is no tenderness. There is no rebound and no guarding.  Musculoskeletal: Normal range of motion. She exhibits no edema or tenderness.  No calf swelling or tenderness.  Neurological: She is alert and oriented to person, place, and time.  Moves all extremities without deficit. Sensation is grossly intact.  Skin: Skin is warm and dry. No rash noted. No erythema.  Psychiatric: She has a normal mood and affect. Her behavior is normal.  Nursing note and vitals reviewed.   ED Course  Procedures (including critical care time)  DIAGNOSTIC  STUDIES: Oxygen Saturation is 95% on RA, adequate by my interpretation.    COORDINATION OF CARE: 1:19 AM- Will order EKG. Discussed treatment plan with pt at bedside and pt agreed to plan.     Labs Review Labs Reviewed - No data to display  Imaging Review No results found.   EKG Interpretation   Date/Time:  Tuesday Mar 17 2015 01:03:11 EDT Ventricular Rate:  70 PR Interval:  176 QRS Duration: 165 QT Interval:  507 QTC Calculation: 547 R Axis:   121 Text Interpretation:  Sinus rhythm Nonspecific intraventricular conduction  delay Probable anteroseptal infarct, recent Confirmed by Shaneisha Burkel  MD,  Ryken Paschal ([id number]) on 03/17/2015 6:06:43 AM      MDM   Final diagnoses:  None    I personally performed the services described in this documentation, which was scribed in my presence. The recorded information has been reviewed and is accurate.  Patient with persistent hypertension in the emergency department. She is asymptomatic with this. Her chest pain is resolved. Appear she had similar issues with hypertension during her recent admission thought to be secondary to medication. She is given a small fluid bolus in the emergency department. Discuss with cardiology who will see the patient in emergency department and admit.  Linda Rice, MD 03/17/15 [phone]

## 2015-03-17 NOTE — ED Notes (Signed)
Dr. Yelverton at the bedside.  

## 2015-03-17 NOTE — Progress Notes (Signed)
Spoke with Dr. Clayborne Artist concerning pt coming to unit with bp of 88/58.  Trop 0.05. ER nurse states pt is asymptomatic. Dr. Clayborne Artist states pt is stable to be admitted to 2west unit. Repeat troponins, and parameters for bp given. Made ER nurse aware. Awaiting pt to come to unit once bed is available.

## 2015-03-17 NOTE — ED Notes (Signed)
Provided patient with apple juice as requested.

## 2015-03-17 NOTE — H&P (Cosign Needed)
Patient ID: [name] MRN: [id number], DOB/AGE: 06-25-1925   Admit date: 03/17/2015   Primary Physician: [name], MD Primary Cardiologist: Bensimhon  CC: Atypical CP  Problem List  Past Medical History  Diagnosis Date  . Dyslipidemia   . Hypertension   . Palpitations     PVCs/bigeminy on event in May 2009 revealing this was relatively asymptomatic  . Chest pain     a. 2011 Neg MV;  b. 01/2014 Cath: LM 20-30, LAD nl, D1 nl, LCX nl, OM1 nl, RCA dom 30-42m, PD/PL nl, EF 65%->Med Rx.  . Degenerative joint disease   . Hx of varicella   . History of skin cancer     tafeen/ non melanoma.   . Thrombocytopenia   . Anemia   . Monoclonal gammopathies   . Right lower quadrant abdominal pain 07/19/2012    Recurrent  With nausea    This is new since she had ct for llq pain in [facility]    R/o appendiceal problem  Hernia  Get ct scan  And plan  Fu     . Diastolic CHF, acute on chronic 01/31/2012  . Atrial fibrillation     a. Dx 01/2014->Eliquis started.  . Cancer   . Pacemaker   . Dysrhythmia     Past Surgical History  Procedure Laterality Date  . Cholecystectomy  1999  . Cesarean section      times 2  . Shoulder surgery  1996    Right   . Cataract extraction      Bilateral  implantt  . Cardioversion N/A 02/26/2014    Procedure: CARDIOVERSION AT BEDSIDE;  Surgeon: Pixie Casino, MD;  Location: [place];  Service: Cardiovascular;  Laterality: N/A;  . Cardioversion N/A 04/22/2014    Procedure: CARDIOVERSION (BEDSIDE);  Surgeon: [name], MD;  Location: [facility] OR;  Service: Cardiovascular;  Laterality: N/A;  . Pacemaker insertion  04/23/2014    STJ Assurity dual chamber pacemaker implanted by Dr Rayann Heman  . Insert / replace / remove pacemaker    . Esophagogastroduodenoscopy N/A 07/06/2014    Procedure: ESOPHAGOGASTRODUODENOSCOPY (EGD);  Surgeon: Ladene Artist, MD;  Location: [facility] ENDOSCOPY;  Service: Endoscopy;  Laterality: N/A;  . Left heart catheterization with  coronary angiogram N/A 01/29/2014    Procedure: LEFT HEART CATHETERIZATION WITH CORONARY ANGIOGRAM;  Surgeon: Blane Ohara, MD;  Location: [facility] CATH LAB;  Service: Cardiovascular;  Laterality: N/A;  . Permanent pacemaker insertion N/A 04/23/2014    Procedure: PERMANENT PACEMAKER INSERTION;  Surgeon: Evans Lance, MD;  Location: [facility] CATH LAB;  Service: Cardiovascular;  Laterality: N/A;  . Right heart catheterization N/A 09/22/2014    Procedure: RIGHT HEART CATH;  Surgeon: Jolaine Artist, MD;  Location: [facility] CATH LAB;  Service: Cardiovascular;  Laterality: N/A;     Allergies  Allergies  Allergen Reactions  . Tramadol Nausea Only    HPI The patient is an 81F with a history of HFpEF, PAF on apixaban, HTN, CKD, no significant CAD who presents with atypical CP. She reports that she was watching TV around 11:30p when she had a sudden episode of chest pressure. It was associated with nausea and she quickly vomited x 1 with minimal relief. She recalls that she ate dinner around 5:30p [employer]). She did not describe any associated palpitations or shortness of breath with this CP. The pressure started under her right breast and then radiated to her left breast and her back. She does not typically have  CP and does not recall the last time that this happened.Due to worsening sxs she called her daughter who recommended that she call EMS to bring her to the ED.  In the ED, VS 70 94/41. En route she was given ASA 324 and SL NTG. There was concern that the SL NTG reduced her BP. Her first set of markers were mildly positive with a troponin of 0.05. We are called for additional evaluation and management.   Of note her last cardiac catheterization was in 01/2014 and demonstrated nonobstructive CAD. She currently has moderate-severe CKD with a baseline Cr in the 1.9-2.1 range.  Home Medications  Prior to Admission medications   Medication Sig Start Date End Date Taking? Authorizing Provider    acetaminophen (TYLENOL) 325 MG tablet Take 2 tablets (650 mg total) by mouth every 4 (four) hours as needed for headache or mild pain. 09/26/14  Yes Isaiah Serge, NP  amiodarone (PACERONE) 200 MG tablet Take 1 tablet (200 mg total) by mouth daily. 11/06/14  Yes Jolaine Artist, MD  apixaban (ELIQUIS) 2.5 MG TABS tablet Take 1 tablet (2.5 mg total) by mouth 2 (two) times daily. 12/30/14  Yes Jolaine Artist, MD  erythromycin ophthalmic ointment Place 1 application into the right eye 4 (four) times daily. One-half inch (1.25 cm) four times daily for 5 to 7 days 02/21/15  Yes Marin Olp, MD  ferrous sulfate 325 (65 FE) MG tablet Take 1 tablet (325 mg total) by mouth 2 (two) times daily with a meal. 11/21/14  Yes Burnis Medin, MD  levothyroxine (SYNTHROID, LEVOTHROID) 75 MCG tablet Take 1 tablet (75 mcg total) by mouth daily. 11/21/14  Yes Burnis Medin, MD  loratadine (CLARITIN) 10 MG tablet Take 10 mg by mouth daily as needed for allergies.    Yes Historical Provider, MD  LORazepam (ATIVAN) 0.5 MG tablet Take 0.5-1 tablets (0.25-0.5 mg total) by mouth every 8 (eight) hours as needed for anxiety. 09/09/14  Yes Burnis Medin, MD  metolazone (ZAROXOLYN) 2.5 MG tablet Take 1 tablet (2.5 mg total) by mouth as needed (for weight 136 lb or greater). 12/16/14  Yes Jolaine Artist, MD  nitroGLYCERIN (NITROSTAT) 0.4 MG SL tablet Place 1 tablet (0.4 mg total) under the tongue every 5 (five) minutes as needed. For chest pain 12/02/13  Yes Fay Records, MD  pantoprazole (PROTONIX) 40 MG tablet Take 1 tablet (40 mg total) by mouth daily. 08/11/14  Yes Burnis Medin, MD  potassium chloride SA (KLOR-CON M20) 20 MEQ tablet Take 2 tablets (40 mEq total) by mouth daily. 02/14/15  Yes Almyra Deforest, PA  senna (SENOKOT) 8.6 MG TABS tablet Take 2 tablets (17.2 mg total) by mouth daily as needed for mild constipation. 09/26/14  Yes Isaiah Serge, NP  simvastatin (ZOCOR) 10 MG tablet Take 1 tablet (10 mg total) by mouth  at bedtime. 11/06/14  Yes Jolaine Artist, MD  torsemide (DEMADEX) 20 MG tablet Take 2 tablets (40 mg total) by mouth daily. 02/14/15  Yes Almyra Deforest, PA    Family History  Family History  Problem Relation Age of Onset  . Coronary artery disease Father   . Heart disease Father   . Lung cancer    . Alzheimer's disease Mother   . Cancer Brother 4    lung cancer  . Heart attack Father     Social History  History   Social History  . Marital Status: Widowed  Spouse Name: N/A  . Number of Children: 3  . Years of Education: N/A   Occupational History  .      [employer], book keeping   Social History Main Topics  . Smoking status: Never Smoker   . Smokeless tobacco: Never Used  . Alcohol Use: No  . Drug Use: No  . Sexual Activity: No   Other Topics Concern  . 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 Eats with her.      Daughter handles medications. Sees HH and PT once a week.     Review of Systems General:  No chills, fever, night sweats or weight changes.  Cardiovascular:  +chest pain, -dyspnea on exertion, -edema, -orthopnea, -palpitations, -paroxysmal nocturnal dyspnea. Dermatological: No rash, lesions/masses Respiratory: No cough, dyspnea Urologic: No hematuria, dysuria Abdominal:   +nausea, +vomiting,- diarrhea,- bright red blood per rectum,-melena, or -hematemesis Neurologic:  No visual changes, wkns, changes in mental status. All other systems reviewed and are otherwise negative except as noted above.  Physical Exam  Blood pressure 94/41, pulse 70, temperature 97.5 F (36.4 C), temperature source Oral, resp. rate 16, SpO2 100 %.  General: Pleasant, NAD Psych: Normal affect. Neuro: Alert and oriented X 3. Moves all  extremities spontaneously. HEENT: Normal  Neck: Supple without bruits or JVD. Lungs:  Resp regular and unlabored, CTA. Heart: IRIRno s3, s4, or murmurs. Abdomen: Soft, non-tender, non-distended, BS + x 4.  Extremities: No clubbing, cyanosis or edema. DP/PT/Radials 2+ and equal bilaterally.  Labs  Troponin (Point of Care Test) No results for input(s): TROPIPOC in the last 72 hours.  Recent Labs  03/17/15 0201  TROPONINI 0.05*   Lab Results  Component Value Date   WBC 10.6* 03/17/2015   HGB 12.3 03/17/2015   HCT 37.9 03/17/2015   MCV 97.7 03/17/2015   PLT 122* 03/17/2015    Recent Labs Lab 03/17/15 0201  NA 141  K 3.6  CL 100*  CO2 28  BUN 38*  CREATININE 2.08*  CALCIUM 9.3  PROT 6.6  BILITOT 0.5  ALKPHOS 108  ALT 16  AST 19  GLUCOSE 148*   Lab Results  Component Value Date   CHOL 141 04/14/2014   HDL 89 04/14/2014   LDLCALC 41 04/14/2014   TRIG 56 04/14/2014   No results found for: DDIMER   Radiology/Studies  Dg Chest Port 1 View  03/17/2015   CLINICAL DATA:  Chest pain, vomiting, hypertension  EXAM: PORTABLE CHEST - 1 VIEW  COMPARISON:  Radiograph 02/10/2015  FINDINGS: Left-sided pacemaker overlies normal cardiac silhouette. No effusion, infiltrate, or pneumothorax. Chronic bronchitic markings are noted.  IMPRESSION: Chronic bronchitic pulmonary markings.  No acute findings.   Electronically Signed   By: Suzy Bouchard M.D.   On: 03/17/2015 02:24    ECG SR @ 70bpm, RAD, anterior q-waves.  TTE 02/2014 Study Conclusions  - Left ventricle: The cavity size was normal. Wall thickness was increased in a pattern of mild LVH. There was focal basal hypertrophy. Systolic function was normal. The estimated ejection fraction was in the range of 60% to 65%. Wall motion was normal; there were no regional wall motion  abnormalities. - Left atrium: The atrium was mildly dilated. - Right atrium: The atrium was mildly dilated. - Pericardium,  extracardiac: A trivial pericardial effusion was identified. Transthoracic echocardiography. M-mode, complete 2D, spectral Doppler, and color Doppler. Height: Height: 142.2cm. Height: 56in. Weight: Weight: 68.5kg. Weight: 150.7lb. Body mass index: BMI: 33.9kg/m^2. Body surface area:  BSA: 1.77m^2. Blood pressure:   97/42. Patient status: Inpatient. Location: Bedside.  Cath 01/2014 Procedural Findings: Hemodynamics: AO 122/59 LV 132/13  Coronary angiography: Coronary dominance: right  Left mainstem: The left main is patent without obstructive disease. There is diffuse irregularity noted. The mid left main has 20-30% stenosis and there is moderate calcification noted.  Left anterior descending (LAD): The LAD is patent to the left ventricular apex. There is mild diffuse plaquing without significant stenosis. The first diagonal is patent.  Left circumflex (LCx): Left circumflex is patent. The first OM is patent without significant stenosis.  Right coronary artery (RCA): This is a large, dominant vessel. The vessel is moderately calcified. The mid vessel has 30-40% stenosis. The PDA and PLA branches are patent.  Left ventriculography: Left ventricular systolic function is vigorous, LVEF is estimated at 65%, there is no significant mitral regurgitation   Final Conclusions:  1. Diffuse nonobstructive coronary artery disease 2. Normal left ventricular systolic function  ASSESSMENT AND PLAN 1. CHest pain  2. Hypotension 3. Hyperlipidemia 4. Chronic kidney disease  The patient is an 11F with a history of HFpEF, PAF on apixaban, HTN, CKD, no significant CAD who presents with atypical CP. Her symptoms are somewhat unusual for cardiac chest pain but pressure is concerning. Reassuringly she had recent cath a little more than 1 year ago that did not demonstrate significant CAD. Given her significant CKD would have a very high threshold for invasive management - aspirin 324  mg given, 81 mg PO daily thereafter  - heparin gtt deferred until significantly positive markers - defer BB, ACEi for now in the setting of soft pressures - continue statin - NPO after MN for possible LHC vs stress testing. Would favor ischemia-driven approach with additional testing only in the setting of significantly increased markers - home medications continued  #HFpEF: She is euvolemic on exam with no evidence of volume overload -continue home torsemide, holding metolazone -defer BB, ACEi for now  #. HLD: Continue statin  #CKD: Her Cr is at its baseline.  -avoid nephrotoxic meds -would avoid cath if possible given her renal risks  Signed, Raliegh Ip, MD MPH 03/17/2015, 4:36 AM

## 2015-03-17 NOTE — ED Notes (Signed)
Charge nurse calls back, patient allowed to transfer to 2W with paramaters for bp management after she spoke with Dr. Clayborne Artist. Awaiting bed to be available, receiving rn, joy, will call.

## 2015-03-17 NOTE — ED Notes (Signed)
Spoke with charge on 2W, awaiting response from Dr. Clayborne Artist.

## 2015-03-17 NOTE — Discharge Instructions (Signed)
Information on my medicine - ELIQUIS® (apixaban) ° °This medication education was reviewed with me or my healthcare representative as part of my discharge preparation.  The pharmacist that spoke with me during my hospital stay was:  Matteus Mcnelly Rhea, RPH ° °Why was Eliquis® prescribed for you? °Eliquis® was prescribed for you to reduce the risk of a blood clot forming that can cause a stroke if you have a medical condition called atrial fibrillation (a type of irregular heartbeat). ° °What do You need to know about Eliquis® ? °Take your Eliquis® TWICE DAILY - one tablet in the morning and one tablet in the evening with or without food. If you have difficulty swallowing the tablet whole please discuss with your pharmacist how to take the medication safely. ° °Take Eliquis® exactly as prescribed by your doctor and DO NOT stop taking Eliquis® without talking to the doctor who prescribed the medication.  Stopping may increase your risk of developing a stroke.  Refill your prescription before you run out. ° °After discharge, you should have regular check-up appointments with your healthcare provider that is prescribing your Eliquis®.  In the future your dose may need to be changed if your kidney function or weight changes by a significant amount or as you get older. ° °What do you do if you miss a dose? °If you miss a dose, take it as soon as you remember on the same day and resume taking twice daily.  Do not take more than one dose of ELIQUIS at the same time to make up a missed dose. ° °Important Safety Information °A possible side effect of Eliquis® is bleeding. You should call your healthcare provider right away if you experience any of the following: °  Bleeding from an injury or your nose that does not stop. °  Unusual colored urine (red or dark brown) or unusual colored stools (red or black). °  Unusual bruising for unknown reasons. °  A serious fall or if you hit your head (even if there is no  bleeding). ° °Some medicines may interact with Eliquis® and might increase your risk of bleeding or clotting while on Eliquis®. To help avoid this, consult your healthcare provider or pharmacist prior to using any new prescription or non-prescription medications, including herbals, vitamins, non-steroidal anti-inflammatory drugs (NSAIDs) and supplements. ° °This website has more information on Eliquis® (apixaban): http://www.eliquis.com/eliquis/home ° °

## 2015-03-17 NOTE — ED Notes (Signed)
Pt here for chest pain, radiating to right breast after pt took ntg. bp dropped from 140/66 to 84/50 after ntg. Ems gave zofran for vomiting after ntg and also given 324 asa.

## 2015-03-17 NOTE — ED Notes (Signed)
Cardiology at the bedside, Dr. Clayborne Artist.

## 2015-03-17 NOTE — Progress Notes (Signed)
Patient ID: [name], female   DOB: September 04, 1925, 81 y.o.   MRN: [id number]   SUBJECTIVE: No further chest pain.  Resting comfortably.  No dyspnea.  ECG from admission reviewed, NSR with IVCD.  Troponin 0.05 => 0.05  Scheduled Meds: . amiodarone  200 mg Oral Daily  . apixaban  2.5 mg Oral BID  . ferrous sulfate  325 mg Oral BID WC  . levothyroxine  75 mcg Oral QAC breakfast  . pantoprazole  40 mg Oral Daily  . potassium chloride SA  40 mEq Oral Daily  . simvastatin  10 mg Oral QHS  . torsemide  40 mg Oral Daily   Continuous Infusions:  PRN Meds:.acetaminophen, gi cocktail, loratadine, ondansetron (ZOFRAN) IV, senna    Filed Vitals:   03/17/15 0600 03/17/15 0615 03/17/15 0641 03/17/15 0959  BP: 92/40 113/40 86/53 102/45  Pulse: 72 70 74 70  Temp:   97.7 F (36.5 C) 97.9 F (36.6 C)  TempSrc:   Tympanic   Resp:  20 18 18   Height:   4\' 11"  (1.499 m)   Weight:   136 lb 11 oz (62 kg)   SpO2: 97% 99% 99% 98%    Intake/Output Summary (Last 24 hours) at 03/17/15 1030 Last data filed at 03/17/15 0456  Gross per 24 hour  Intake    250 ml  Output      0 ml  Net    250 ml    LABS: Basic Metabolic Panel:  Recent Labs  03/17/15 0201  NA 141  K 3.6  CL 100*  CO2 28  GLUCOSE 148*  BUN 38*  CREATININE 2.08*  CALCIUM 9.3   Liver Function Tests:  Recent Labs  03/17/15 0201  AST 19  ALT 16  ALKPHOS 108  BILITOT 0.5  PROT 6.6  ALBUMIN 3.5    Recent Labs  03/17/15 0201  LIPASE 40   CBC:  Recent Labs  03/17/15 0201  WBC 10.6*  NEUTROABS 9.3*  HGB 12.3  HCT 37.9  MCV 97.7  PLT 122*   Cardiac Enzymes:  Recent Labs  03/17/15 0201 03/17/15 0725  TROPONINI 0.05* 0.05*   BNP: Invalid input(s): POCBNP D-Dimer: No results for input(s): DDIMER in the last 72 hours. Hemoglobin A1C: No results for input(s): HGBA1C in the last 72 hours. Fasting Lipid Panel: No results for input(s): CHOL, HDL, LDLCALC, TRIG, CHOLHDL, LDLDIRECT in the last 72  hours. Thyroid Function Tests: No results for input(s): TSH, T4TOTAL, T3FREE, THYROIDAB in the last 72 hours.  Invalid input(s): FREET3 Anemia Panel: No results for input(s): VITAMINB12, FOLATE, FERRITIN, TIBC, IRON, RETICCTPCT in the last 72 hours.  RADIOLOGY: Dg Chest Port 1 View  03/17/2015   CLINICAL DATA:  Chest pain, vomiting, hypertension  EXAM: PORTABLE CHEST - 1 VIEW  COMPARISON:  Radiograph 02/10/2015  FINDINGS: Left-sided pacemaker overlies normal cardiac silhouette. No effusion, infiltrate, or pneumothorax. Chronic bronchitic markings are noted.  IMPRESSION: Chronic bronchitic pulmonary markings.  No acute findings.   Electronically Signed   By: Suzy Bouchard M.D.   On: 03/17/2015 02:24    PHYSICAL EXAM General: NAD Neck: No JVD, no thyromegaly or thyroid nodule.  Lungs: Clear to auscultation bilaterally with normal respiratory effort. CV: Nondisplaced PMI.  Heart regular S1/S2, no S3/S4, no murmur.  No peripheral edema.  No carotid bruit.   Abdomen: Soft, nontender, no hepatosplenomegaly, no distention.  Neurologic: Alert and oriented x 3.  Psych: Normal affect. Extremities: No clubbing or cyanosis.   TELEMETRY:  Reviewed telemetry pt in a-paced, v-paced  ASSESSMENT AND PLAN: 81 yo with history of paroxysmal atrial fibrillation, nonobstructive CAD on 3/15 cath, chronic diastolic CHF, and CKD presented with chest pain.  1. Chest pain: Started under right breast and migrated to center.  Occurred at about 11 pm while watching news.  Resolved after about 30 minutes.  Got NTG from EMS and became hypotensive.  BP has now recovered.  Troponin mildly elevated at 0.05 x 2.  She has not had further chest pain.   - Would repeat troponin at noon. - Will arrange for echo to make sure LV function is unchanged.  - Would avoid cath unless absolutely necessary with CKD (GFR around 20).  She had cath in 3/15 with nonobstructive disease.  - Watch overnight to make sure no recurrent pain,  likely home in morning if workup is benign.  - Continue statin, not on ASA due to Eliquis use.  Would continue Eliquis for now.  2. Atrial fibrillation: Paroxysmal, she is atrially paced. Continue amiodarone and Eliquis.  3. Chronic diastolic CHF: Volume status actually looks pretty good.  Would continue current torsemide.  She has a narrow therapeutic window.  SBP runs low but is at her baseline.  She no longer takes midodrine.  4. CKD: Creatinine is at her baseline.   [name] 03/17/2015 10:37 AM

## 2015-03-17 NOTE — ED Notes (Signed)
Attempted to call report

## 2015-03-17 NOTE — ED Notes (Signed)
Spoke to charge nurse on [facility], she is paging admitting to inquire about bed placement.

## 2015-03-18 ENCOUNTER — Observation Stay (HOSPITAL_COMMUNITY): Payer: Medicare Other

## 2015-03-18 DIAGNOSIS — R079 Chest pain, unspecified: Secondary | ICD-10-CM | POA: Diagnosis not present

## 2015-03-18 DIAGNOSIS — R0789 Other chest pain: Secondary | ICD-10-CM | POA: Diagnosis not present

## 2015-03-18 DIAGNOSIS — I5032 Chronic diastolic (congestive) heart failure: Secondary | ICD-10-CM | POA: Diagnosis not present

## 2015-03-18 LAB — BASIC METABOLIC PANEL
Anion gap: 12 (ref 5–15)
BUN: 34 mg/dL — ABNORMAL HIGH (ref 6–20)
CO2: 31 mmol/L (ref 22–32)
CREATININE: 1.96 mg/dL — AB (ref 0.44–1.00)
Calcium: 9.5 mg/dL (ref 8.9–10.3)
Chloride: 96 mmol/L — ABNORMAL LOW (ref 101–111)
GFR calc Af Amer: 25 mL/min — ABNORMAL LOW (ref >60)
GFR calc non Af Amer: 22 mL/min — ABNORMAL LOW (ref >60)
GLUCOSE: 91 mg/dL (ref 70–99)
Potassium: 3.3 mmol/L — ABNORMAL LOW (ref 3.5–5.1)
Sodium: 139 mmol/L (ref 135–145)

## 2015-03-18 LAB — CBC
HCT: 37.9 % (ref 36.0–46.0)
HEMOGLOBIN: 12.3 g/dL (ref 12.0–15.0)
MCH: 31.9 pg (ref 26.0–34.0)
MCHC: 32.5 g/dL (ref 30.0–36.0)
MCV: 98.4 fL (ref 78.0–100.0)
Platelets: 127 10*3/uL — ABNORMAL LOW (ref 150–400)
RBC: 3.85 MIL/uL — AB (ref 3.87–5.11)
RDW: 13.3 % (ref 11.5–15.5)
WBC: 4 10*3/uL (ref 4.0–10.5)

## 2015-03-18 NOTE — Discharge Summary (Signed)
Physician Discharge Summary     Cardiologist:  Bensimhon  Patient ID: [name] MRN: [id number] DOB/AGE: January 01, 1925 81 y.o.  Admit date: 03/17/2015 Discharge date: 03/18/2015  Admission Diagnoses:  Chest Pain  Discharge Diagnoses:  Active Problems:   Chest pain   Hypotension   Hyperlipidemia   CKD  Discharged Condition: stable  Hospital Course:   The patient is an 81F with a history of HFpEF, PAF on apixaban, HTN, CKD, no significant CAD who presents with atypical CP. She reports that she was watching TV around 11:30p when she had a sudden episode of chest pressure. It was associated with nausea and she quickly vomited x 1 with minimal relief. She recalls that she ate dinner around 5:30p [employer]). She did not describe any associated palpitations or shortness of breath with this CP. The pressure started under her right breast and then radiated to her left breast and her back. She does not typically have CP and does not recall the last time that this happened.Due to worsening sxs she called her daughter who recommended that she call EMS to bring her to the ED.  Her last cardiac catheterization was in 01/2014 and demonstrated nonobstructive CAD. She currently has moderate-severe CKD with a baseline Cr in the 1.9-2.1 range.  In the ED, VS 70 94/41. En route she was given ASA 324 and SL NTG. There was concern that the SL NTG reduced her BP. Her first set of markers were mildly positive with a troponin of 0.05. Cardiology called for additional evaluation and management.   She was admitted on heparin drip. Statin continued.  BB and ACE deferred due to hypotension.  Torsemide continued and metolazone held.  Troponin increased to 0.05.  No further CP.  Hypotension resolved. Echocardiogram revealed an EF on 45-50%(See below).  Amio and eliquis continued for PAF.  Home meds will be resumed.  CP thought not to be ACS.  The patient was seen by Dr. Aundra Dubin who felt she was stable for DC home.   Follow up arranged.   Consults: None  Significant Diagnostic Studies:  Echocardiogram Study Conclusions  - Left ventricle: The cavity size was normal. There was moderate focal basal and mild concentric hypertrophy. Systolic function was mildly reduced. The estimated ejection fraction was in the range of 45% to 50%. There is hypokinesis of the mid inferoseptal and apical septal myocardium. Features are consistent with a pseudonormal left ventricular filling pattern, with concomitant abnormal relaxation and increased filling pressure (grade 2 diastolic dysfunction). - Aortic valve: Moderate thickening and calcification, consistent with sclerosis. There was trivial regurgitation. - Mitral valve: There appears to be some degree of mitral valve stenosis. The calculated MVA is 1.14cm2 c/w moderate MS but mean gradient was normal at 82mmHg and PHT was not obtained. Recommend repeat limited echo with emphasis on the MVA calculations. Mild focal calcification of the anterior leaflet (medial segment(s)). Mobility was restricted. Diastolic leaflet doming was present. There was mild regurgitation. Valve area by continuity equation (using LVOT flow): 1.14 cm^2. - Left atrium: The atrium was severely dilated. - Pulmonic valve: There was trivial regurgitation. - Pulmonary arteries: PA peak pressure: 34 mm Hg (S).  Treatments:  See Above  Discharge Exam: Blood pressure 99/41, pulse 74, temperature 97.9 F (36.6 C), temperature source Oral, resp. rate 20, height 4\' 11"  (1.499 m), weight 136 lb 11 oz (62 kg), SpO2 93 %.   Disposition: 01-Home or Self Care      Discharge Instructions    Diet -  low sodium heart healthy    Complete by:  As directed      Discharge instructions    Complete by:  As directed   Monitor your weight every morning.  If you gain 3 pounds in 24 hours, or 5 pounds in a week, call the office for instructions.     Increase activity slowly     Complete by:  As directed             Medication List    STOP taking these medications        erythromycin ophthalmic ointment      TAKE these medications        acetaminophen 325 MG tablet  Commonly known as:  TYLENOL  Take 2 tablets (650 mg total) by mouth every 4 (four) hours as needed for headache or mild pain.     amiodarone 200 MG tablet  Commonly known as:  PACERONE  Take 1 tablet (200 mg total) by mouth daily.     apixaban 2.5 MG Tabs tablet  Commonly known as:  ELIQUIS  Take 1 tablet (2.5 mg total) by mouth 2 (two) times daily.     ferrous sulfate 325 (65 FE) MG tablet  Take 1 tablet (325 mg total) by mouth 2 (two) times daily with a meal.     levothyroxine 75 MCG tablet  Commonly known as:  SYNTHROID, LEVOTHROID  Take 1 tablet (75 mcg total) by mouth daily.     loratadine 10 MG tablet  Commonly known as:  CLARITIN  Take 10 mg by mouth daily as needed for allergies.     LORazepam 0.5 MG tablet  Commonly known as:  ATIVAN  Take 0.5-1 tablets (0.25-0.5 mg total) by mouth every 8 (eight) hours as needed for anxiety.     metolazone 2.5 MG tablet  Commonly known as:  ZAROXOLYN  Take 1 tablet (2.5 mg total) by mouth as needed (for weight 136 lb or greater).     nitroGLYCERIN 0.4 MG SL tablet  Commonly known as:  NITROSTAT  Place 1 tablet (0.4 mg total) under the tongue every 5 (five) minutes as needed. For chest pain     pantoprazole 40 MG tablet  Commonly known as:  PROTONIX  Take 1 tablet (40 mg total) by mouth daily.     potassium chloride SA 20 MEQ tablet  Commonly known as:  KLOR-CON M20  Take 2 tablets (40 mEq total) by mouth daily.     senna 8.6 MG Tabs tablet  Commonly known as:  SENOKOT  Take 2 tablets (17.2 mg total) by mouth daily as needed for mild constipation.     simvastatin 10 MG tablet  Commonly known as:  ZOCOR  Take 1 tablet (10 mg total) by mouth at bedtime.     torsemide 20 MG tablet  Commonly known as:  DEMADEX  Take 2  tablets (40 mg total) by mouth daily.       Follow-up Information    Follow up with [name], MD On 03/26/2015.   Specialty:  Cardiology   Why:  3:00PM   Contact information:   [address] [place] [address] [phone]      Greater than 30 minutes was spent completing the patient's discharge.    SignedTarri Fuller, [place] 03/18/2015, 5:25 PM

## 2015-03-18 NOTE — Progress Notes (Signed)
Spoke with Dr. Terrence Dupont about pt. D/c orders and that the pt. Stated that her daughter had taken her meds and couldn't drive to come get her and that if we found her a way home that she couldn't get in the house because she didn't have a key. MD said ok to do d/c in morning.

## 2015-03-18 NOTE — Progress Notes (Signed)
UR completed 

## 2015-03-18 NOTE — Progress Notes (Signed)
Patient ID: [name], female   DOB: 08-11-1925, 81 y.o.   MRN: [id number]   SUBJECTIVE: No further chest pain.  Resting comfortably.  No dyspnea.  Troponin persistently 0.05.   Scheduled Meds: . amiodarone  200 mg Oral Daily  . apixaban  2.5 mg Oral BID  . ferrous sulfate  325 mg Oral BID WC  . levothyroxine  75 mcg Oral QAC breakfast  . pantoprazole  40 mg Oral Daily  . potassium chloride SA  40 mEq Oral Daily  . simvastatin  10 mg Oral QHS  . torsemide  40 mg Oral Daily   Continuous Infusions:  PRN Meds:.acetaminophen, gi cocktail, loratadine, ondansetron (ZOFRAN) IV, senna    Filed Vitals:   03/17/15 0959 03/17/15 1407 03/17/15 2020 03/18/15 0359  BP: 102/45 100/53 107/47 101/52  Pulse: 70 70 73 72  Temp: 97.9 F (36.6 C) 98 F (36.7 C) 97.6 F (36.4 C) 98.1 F (36.7 C)  TempSrc:  Oral Oral Oral  Resp: 18 18 18 18   Height:      Weight:      SpO2: 98% 98% 96% 97%    Intake/Output Summary (Last 24 hours) at 03/18/15 0918 Last data filed at 03/18/15 0900  Gross per 24 hour  Intake   1080 ml  Output   1700 ml  Net   -620 ml    LABS: Basic Metabolic Panel:  Recent Labs  03/17/15 0201 03/18/15 0330  NA 141 139  K 3.6 3.3*  CL 100* 96*  CO2 28 31  GLUCOSE 148* 91  BUN 38* 34*  CREATININE 2.08* 1.96*  CALCIUM 9.3 9.5   Liver Function Tests:  Recent Labs  03/17/15 0201  AST 19  ALT 16  ALKPHOS 108  BILITOT 0.5  PROT 6.6  ALBUMIN 3.5    Recent Labs  03/17/15 0201  LIPASE 40   CBC:  Recent Labs  03/17/15 0201 03/18/15 0330  WBC 10.6* 4.0  NEUTROABS 9.3*  --   HGB 12.3 12.3  HCT 37.9 37.9  MCV 97.7 98.4  PLT 122* 127*   Cardiac Enzymes:  Recent Labs  03/17/15 0725 03/17/15 1044 03/17/15 1355  TROPONINI 0.05* 0.05* 0.05*   BNP: Invalid input(s): POCBNP D-Dimer: No results for input(s): DDIMER in the last 72 hours. Hemoglobin A1C: No results for input(s): HGBA1C in the last 72 hours. Fasting Lipid Panel: No  results for input(s): CHOL, HDL, LDLCALC, TRIG, CHOLHDL, LDLDIRECT in the last 72 hours. Thyroid Function Tests: No results for input(s): TSH, T4TOTAL, T3FREE, THYROIDAB in the last 72 hours.  Invalid input(s): FREET3 Anemia Panel: No results for input(s): VITAMINB12, FOLATE, FERRITIN, TIBC, IRON, RETICCTPCT in the last 72 hours.  RADIOLOGY: Dg Chest Port 1 View  03/17/2015   CLINICAL DATA:  Chest pain, vomiting, hypertension  EXAM: PORTABLE CHEST - 1 VIEW  COMPARISON:  Radiograph 02/10/2015  FINDINGS: Left-sided pacemaker overlies normal cardiac silhouette. No effusion, infiltrate, or pneumothorax. Chronic bronchitic markings are noted.  IMPRESSION: Chronic bronchitic pulmonary markings.  No acute findings.   Electronically Signed   By: Suzy Bouchard M.D.   On: 03/17/2015 02:24    PHYSICAL EXAM General: NAD Neck: No JVD, no thyromegaly or thyroid nodule.  Lungs: Clear to auscultation bilaterally with normal respiratory effort. CV: Nondisplaced PMI.  Heart regular S1/S2, no S3/S4, no murmur.  No peripheral edema.  No carotid bruit.   Abdomen: Soft, nontender, no hepatosplenomegaly, no distention.  Neurologic: Alert and oriented x 3.  Psych: Normal affect.  Extremities: No clubbing or cyanosis.   TELEMETRY: Reviewed telemetry pt in a-paced, v-paced  ASSESSMENT AND PLAN: 81 yo with history of paroxysmal atrial fibrillation, nonobstructive CAD on 3/15 cath, chronic diastolic CHF, and CKD presented with chest pain.  1. Chest pain: Started under right breast and migrated to center.  Occurred at about 11 pm while watching news.  Resolved after about 30 minutes.  Got NTG from EMS and became hypotensive.  BP has now recovered.  No further chest pain.  Troponin persistently mildly elevated at 0.05.  I do not think that this is ACS.  - Awaiting echo to make sure LV function is unchanged.  - Would avoid cath unless absolutely necessary with CKD (GFR around 20).  She had cath in 3/15 with  nonobstructive disease.  - Continue statin, not on ASA due to Eliquis use.  Would continue Eliquis for now.  2. Atrial fibrillation: Paroxysmal, she is atrially paced. Continue amiodarone and Eliquis.  3. Chronic diastolic CHF: Volume status actually looks pretty good.  Would continue current torsemide.  She has a narrow therapeutic window.  SBP runs low but is at her baseline.  She no longer takes midodrine.  4. CKD: Creatinine is at her baseline.  5. Disposition: She can go home today after her echo.  No change to her home meds.  She will need to followup in CHF clinic in 7-10 days.   [name] 03/18/2015 9:18 AM

## 2015-03-19 NOTE — Progress Notes (Signed)
Discharge education completed by RN. Pt and daughter received a copy of discharge paperwork and confirm understanding of follow up appointments and discharge medications. Both deny any questions at this time. IV removed, site is within normal limits. Pt will discharge from the unit via wheelchair. 

## 2015-03-23 ENCOUNTER — Encounter: Payer: Self-pay | Admitting: Internal Medicine

## 2015-03-23 ENCOUNTER — Ambulatory Visit (INDEPENDENT_AMBULATORY_CARE_PROVIDER_SITE_OTHER): Payer: Medicare Other | Admitting: Internal Medicine

## 2015-03-23 ENCOUNTER — Ambulatory Visit (INDEPENDENT_AMBULATORY_CARE_PROVIDER_SITE_OTHER): Payer: Medicare Other | Admitting: *Deleted

## 2015-03-23 VITALS — BP 112/60 | Temp 98.1°F | Ht <= 58 in | Wt 138.2 lb

## 2015-03-23 DIAGNOSIS — I5032 Chronic diastolic (congestive) heart failure: Secondary | ICD-10-CM | POA: Diagnosis not present

## 2015-03-23 DIAGNOSIS — N183 Chronic kidney disease, stage 3 unspecified: Secondary | ICD-10-CM

## 2015-03-23 DIAGNOSIS — E876 Hypokalemia: Secondary | ICD-10-CM | POA: Diagnosis not present

## 2015-03-23 DIAGNOSIS — R001 Bradycardia, unspecified: Secondary | ICD-10-CM

## 2015-03-23 DIAGNOSIS — I10 Essential (primary) hypertension: Secondary | ICD-10-CM

## 2015-03-23 LAB — BASIC METABOLIC PANEL
BUN: 37 mg/dL — ABNORMAL HIGH (ref 6–23)
CHLORIDE: 96 meq/L (ref 96–112)
CO2: 34 mEq/L — ABNORMAL HIGH (ref 19–32)
Calcium: 9.9 mg/dL (ref 8.4–10.5)
Creatinine, Ser: 1.84 mg/dL — ABNORMAL HIGH (ref 0.40–1.20)
GFR: 27.51 mL/min — ABNORMAL LOW (ref >60.00)
Glucose, Bld: 86 mg/dL (ref 70–99)
POTASSIUM: 3.5 meq/L (ref 3.5–5.1)
Sodium: 137 mEq/L (ref 135–145)

## 2015-03-23 LAB — MAGNESIUM: Magnesium: 2.2 mg/dL (ref 1.5–2.5)

## 2015-03-23 NOTE — Progress Notes (Signed)
Remote pacemaker transmission.   

## 2015-03-23 NOTE — Progress Notes (Signed)
Pre visit review using our clinic review tool, if applicable. No additional management support is needed unless otherwise documented below in the visit note.  Chief Complaint  Patient presents with  . Follow-up    hosp cdm    HPI: [name] 81 y.o. here for 4 months rov however she jsut got out of hospt for atypical cp  Vomiting   Hs fu cards next week Thinks she ate something bad  Feeling doing ok at home no falling  . Independent [place] [employer] does have back problem . Here with daughter today .  Live alone still no falling  Still has prob taking potassium dec dose   No labs since hosp  ROS: See pertinent positives and negatives per HPI. No current cp sob  Walks independent but has back issues uses cane . Not using benzos any more for anxiety and doing ok  No cough new swelling eating normal for her  Past Medical History  Diagnosis Date  . Dyslipidemia   . Hypertension   . Palpitations     PVCs/bigeminy on event in May 2009 revealing this was relatively asymptomatic  . Chest pain     a. 2011 Neg MV;  b. 01/2014 Cath: LM 20-30, LAD nl, D1 nl, LCX nl, OM1 nl, RCA dom 30-10m, PD/PL nl, EF 65%->Med Rx.  . Degenerative joint disease   . Hx of varicella   . History of skin cancer     tafeen/ non melanoma.   . Thrombocytopenia   . Anemia   . Monoclonal gammopathies   . Right lower quadrant abdominal pain 07/19/2012    Recurrent  With nausea    This is new since she had ct for llq pain in [facility]    R/o appendiceal problem  Hernia  Get ct scan  And plan  Fu     . Diastolic CHF, acute on chronic 01/31/2012  . Atrial fibrillation     a. Dx 01/2014->Eliquis started.  . Cancer   . Pacemaker   . Dysrhythmia   . Family history of adverse reaction to anesthesia     " multiple family members have difficulty waking "    Family History  Problem Relation Age of Onset  . Coronary artery disease Father   . Heart disease Father   . Lung cancer    . Alzheimer's disease Mother   .  Cancer Brother 59    lung cancer  . Heart attack Father     History   Social History  . Marital Status: Widowed    Spouse Name: N/A  . Number of Children: 3  . Years of Education: N/A   Occupational History  .      [employer], book keeping   Social History Main Topics  . Smoking status: Never Smoker   . Smokeless tobacco: Never Used  . Alcohol Use: No  . Drug Use: No  . Sexual Activity: No   Other Topics Concern  . 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 Eats with her.      Daughter handles medications. Sees HH and PT once a week.    Outpatient Prescriptions Prior  to Visit  Medication Sig Dispense Refill  . acetaminophen (TYLENOL) 325 MG tablet Take 2 tablets (650 mg total) by mouth every 4 (four) hours as needed for headache or mild pain.    Marland Kitchen amiodarone (PACERONE) 200 MG tablet Take 1 tablet (200 mg total) by mouth daily. 30 tablet 6  . apixaban (ELIQUIS) 2.5 MG TABS tablet Take 1 tablet (2.5 mg total) by mouth 2 (two) times daily. 60 tablet 6  . ferrous sulfate 325 (65 FE) MG tablet Take 1 tablet (325 mg total) by mouth 2 (two) times daily with a meal. 180 tablet 1  . levothyroxine (SYNTHROID, LEVOTHROID) 75 MCG tablet Take 1 tablet (75 mcg total) by mouth daily. 90 tablet 1  . loratadine (CLARITIN) 10 MG tablet Take 10 mg by mouth daily as needed for allergies.     . metolazone (ZAROXOLYN) 2.5 MG tablet Take 1 tablet (2.5 mg total) by mouth as needed (for weight 136 lb or greater). 10 tablet 3  . nitroGLYCERIN (NITROSTAT) 0.4 MG SL tablet Place 1 tablet (0.4 mg total) under the tongue every 5 (five) minutes as needed. For chest pain 25 tablet 6  . pantoprazole (PROTONIX) 40 MG tablet Take 1 tablet (40 mg total) by mouth daily. 30 tablet 5    . potassium chloride SA (KLOR-CON M20) 20 MEQ tablet Take 2 tablets (40 mEq total) by mouth daily.    Marland Kitchen senna (SENOKOT) 8.6 MG TABS tablet Take 2 tablets (17.2 mg total) by mouth daily as needed for mild constipation. 30 tablet 6  . simvastatin (ZOCOR) 10 MG tablet Take 1 tablet (10 mg total) by mouth at bedtime. 30 tablet 6  . torsemide (DEMADEX) 20 MG tablet Take 2 tablets (40 mg total) by mouth daily.    Marland Kitchen LORazepam (ATIVAN) 0.5 MG tablet Take 0.5-1 tablets (0.25-0.5 mg total) by mouth every 8 (eight) hours as needed for anxiety. 30 tablet 1   No facility-administered medications prior to visit.     EXAM:  BP 112/60 mmHg  Temp(Src) 98.1 F (36.7 C) (Oral)  Ht 4\' 10"  (5.597 m)  Wt 138 lb 3.2 oz (62.687 kg)  BMI 28.89 kg/m2  Body mass index is 28.89 kg/(m^2).  GENERAL: vitals reviewed and listed above, alert, oriented, appears well hydrated and in no acute distress ambulatory can use cane  HEENT: atraumatic, conjunctiva  clear, no obvious abnormalities on inspection of external nose and ears OP : no lesion edema or exudate  NECK: no obvious masses on inspection palpation  LUNGS: clear to auscultation bilaterally, no wheezes, rales or rhonchi, good air movement CV: HRRR, no clubbing cyanosis or  peripheral edema nl cap refill  MS: moves all extremities without noticeable focal  abnormality PSYCH: pleasant and cooperative, no obvious depression or anxiety  Record reveiwed ASSESSMENT AND PLAN:  Discussed the following assessment and plan:  Hypokalemia - Plan: Basic metabolic panel, Magnesium  Essential hypertension - Plan: Basic metabolic panel, Magnesium  Chronic diastolic heart failure - Plan: Basic metabolic panel, Magnesium  Chronic renal disease, stage III  Lab today  Cards can review also  . Continue on their management plan Agree stay offf benzo unless extenuating circumstance  Is in independent living at this time cognition is good  -Patient advised to return or  notify health care team  if symptoms worsen ,persist or new concerns arise. Total visit 93mins > 50% spent counseling and coordinating care as indicated in above note and in instructions to patient . And daughter  Patient Instructions  Will notify you  of labs when available. To check potassium repeat .   Cardiology should be able to see results . Otherwise   ROV in 6 months   Or as needed    Mariann Laster K. Tysheem Accardo M.D. Lab Results  Component Value Date   WBC 4.0 03/18/2015   HGB 12.3 03/18/2015   HCT 37.9 03/18/2015   PLT 127* 03/18/2015   GLUCOSE 86 03/23/2015   CHOL 141 04/14/2014   TRIG 56 04/14/2014   HDL 89 04/14/2014   LDLCALC 41 04/14/2014   ALT 16 03/17/2015   AST 19 03/17/2015   NA 137 03/23/2015   K 3.5 03/23/2015   CL 96 03/23/2015   CREATININE 1.84* 03/23/2015   BUN 37* 03/23/2015   CO2 34* 03/23/2015   TSH 3.588 02/11/2015   INR 1.23 12/26/2014

## 2015-03-23 NOTE — Patient Instructions (Signed)
Will notify you  of labs when available. To check potassium repeat .   Cardiology should be able to see results . Otherwise   ROV in 6 months   Or as needed

## 2015-03-25 LAB — CUP PACEART REMOTE DEVICE CHECK
Battery Remaining Longevity: 95 mo
Battery Voltage: 2.99 V
Brady Statistic AP VS Percent: 1 %
Brady Statistic AS VP Percent: 1 %
Brady Statistic RA Percent Paced: 99 %
Lead Channel Sensing Intrinsic Amplitude: 3.1 mV
Lead Channel Setting Sensing Sensitivity: 2 mV
MDC IDC MSMT BATTERY REMAINING PERCENTAGE: 94 %
MDC IDC MSMT LEADCHNL RA IMPEDANCE VALUE: 460 Ohm
MDC IDC MSMT LEADCHNL RV IMPEDANCE VALUE: 540 Ohm
MDC IDC SESS DTM: 20160509093554
MDC IDC SET LEADCHNL RA PACING AMPLITUDE: 2 V
MDC IDC SET LEADCHNL RV PACING AMPLITUDE: 2.5 V
MDC IDC SET LEADCHNL RV PACING PULSEWIDTH: 0.4 ms
MDC IDC STAT BRADY AP VP PERCENT: 99 %
MDC IDC STAT BRADY AS VS PERCENT: 1 %
MDC IDC STAT BRADY RV PERCENT PACED: 99 %
Pulse Gen Model: 2240
Pulse Gen Serial Number: 7627922

## 2015-03-26 ENCOUNTER — Ambulatory Visit (HOSPITAL_COMMUNITY)
Admission: RE | Admit: 2015-03-26 | Discharge: 2015-03-26 | Disposition: A | Discharge status: Home / Self Care | Payer: Medicare Other | Source: Ambulatory Visit | Attending: Internal Medicine | Admitting: Internal Medicine

## 2015-03-26 VITALS — BP 104/58 | HR 79 | Wt 136.8 lb

## 2015-03-26 DIAGNOSIS — N183 Chronic kidney disease, stage 3 unspecified: Secondary | ICD-10-CM

## 2015-03-26 DIAGNOSIS — I959 Hypotension, unspecified: Secondary | ICD-10-CM | POA: Diagnosis not present

## 2015-03-26 DIAGNOSIS — I480 Paroxysmal atrial fibrillation: Secondary | ICD-10-CM | POA: Diagnosis not present

## 2015-03-26 DIAGNOSIS — Z7901 Long term (current) use of anticoagulants: Secondary | ICD-10-CM | POA: Diagnosis not present

## 2015-03-26 DIAGNOSIS — Z79899 Other long term (current) drug therapy: Secondary | ICD-10-CM | POA: Diagnosis not present

## 2015-03-26 DIAGNOSIS — I129 Hypertensive chronic kidney disease with stage 1 through stage 4 chronic kidney disease, or unspecified chronic kidney disease: Secondary | ICD-10-CM | POA: Insufficient documentation

## 2015-03-26 DIAGNOSIS — Z950 Presence of cardiac pacemaker: Secondary | ICD-10-CM | POA: Diagnosis not present

## 2015-03-26 DIAGNOSIS — R54 Age-related physical debility: Secondary | ICD-10-CM | POA: Diagnosis not present

## 2015-03-26 DIAGNOSIS — I481 Persistent atrial fibrillation: Secondary | ICD-10-CM | POA: Diagnosis not present

## 2015-03-26 DIAGNOSIS — E785 Hyperlipidemia, unspecified: Secondary | ICD-10-CM | POA: Diagnosis not present

## 2015-03-26 DIAGNOSIS — I2510 Atherosclerotic heart disease of native coronary artery without angina pectoris: Secondary | ICD-10-CM | POA: Insufficient documentation

## 2015-03-26 DIAGNOSIS — I5032 Chronic diastolic (congestive) heart failure: Secondary | ICD-10-CM | POA: Insufficient documentation

## 2015-03-26 DIAGNOSIS — R001 Bradycardia, unspecified: Secondary | ICD-10-CM | POA: Insufficient documentation

## 2015-03-26 DIAGNOSIS — E876 Hypokalemia: Secondary | ICD-10-CM | POA: Insufficient documentation

## 2015-03-26 MED ORDER — POTASSIUM CHLORIDE CRYS ER 20 MEQ PO TBCR
60.0000 meq | EXTENDED_RELEASE_TABLET | Freq: Every day | ORAL | Status: DC
Start: 2015-03-26 — End: 2015-12-31

## 2015-03-26 NOTE — Addendum Note (Signed)
Encounter addended by: Scarlette Calico, RN on: 03/26/2015  3:50 PM<BR>     Documentation filed: Orders, Patient Instructions Section

## 2015-03-26 NOTE — Progress Notes (Signed)
Patient ID: [name], female   DOB: 01-09-1925, 81 y.o.   MRN: [id number]  PCP: Dr Netty Starring Primary Cardiologist: Dr. Harrington Challenger  HPI: [name] is a 81 y.o. female with the history of nonobstructive CAD by cardiac catheterization in 01/2014, persistent atrial fibrillation, diastolic CHF, HTN, HL, anemia, and CKD. Patient was admitted 04/2014 x2 with acute on chronic diastolic CHF. She underwent cardioversion with restoration of NSR. This resulted in profound bradycardia and hypotension requiring pacemaker implantation. Pacemaker implant was complicated by acute blood loss anemia requiring transfusion with PRBCs  Admitted to [facility] August 21 with increased dyspnea. Diuresed with IV lasix and transitioned to po lasix 40 mg twice a day. GI evaluated due to anemia and black stool. Had EGD with no acute findings. Colonoscopy was cancelled per daughter due to multiple medical problems. She continued on eliquis 2.5 mg twice a day.  Palliative Care consulted and the patient and family request aggressive care for heart failure. Discharge weight was 152 pounds.   Admitted to [facility] 11/9 after RHC due to elevated filling pressures. Once diuresed she was put on torsemide 20 mg twice a day. She was also restarted on amiodarone 200 mg daily and continue on eliquis 2.5 mg twice a day. Discharge weight was 141 pounds.   Admitted 12/12 to 12/17 with recurrent HF and AF. Admit weight was 136. Was diuresed to 131. Diuresis limited by low BP. Demadex increased from 20 bid to 40 bid.   Admitted 12/06/14 - 12/09/14 for ADHF. Initial weight 134 pounds. Diuresed down to 130 and felt better. Treatment limited due to hypotension and worsening renal failure with creatinine up to 2.6. Renal u/s ok. Metolazone stopped.  Admitted in 3/16 with hypotension. Started on midodrine 2.5 bid. Demadex cut back from 40 bid to 40 daily.   She return for follow up: Here with daughter. Admitted last week. Developed nausea/vomitting folloowed  by CP and hypotension. Troponin minimally elevated. Had echo with EF 45-50%. Treated with medical therapy. Feeling better since being home.  Weight has been very stable 133-4.  Denies SOB or CP.Marland Kitchen No orthopnea or PND. Tolerating Eliquis. Has not taken metolazone lately.   Studies:  - LHC (3/15): Mid LM 20-30%, mid RCA 30-40%, EF 65%  - Echo (4/15): Mild LVH, EF 60-65%, no RWMA, mild BAE, trivial eff  - Echo 5/15: EF 45-50% septl HK - Carotid US (5/15): 40-59% RICA stenosis. 2-44% LICA stenosis.   RHC 09/22/14  RA = 11 RV = 49/6/10 PA = 49/22 (34) PCW = 18 (v = 25) Fick cardiac output/index = 6.4/4.0 PVR = 2.5 WU FA sat = 95% PA sat = 72%,74%  Labs 07/18/14 K 3.5 Creatinine 1.44 Hgb 10.7  Labs 07/14/14 K 4.1 Creatinine 1.46  Hgb 9.8  Labs 07/04/14 TSH 2.7 Labs 09/06/14: K 4.0, creatinine 1.93 Labs 09/15/14 K 4.6 Creatinine 3.9  Labs 09/26/14 K 3.6 Creatinine 1.61 hgb 13.7 Labs 10/01/14 K 4.4 Creatinine 1.7 Hgb 14/1  Labs 10/30/14 K 3.9 Creatinine 1.4  Labs 01/12/2015: K 4.2 Creatinine 2.22 Labs 03/23/2015: K 3.5 Creatinine 1.8  ROS: All systems negative except as listed in HPI, PMH and Problem List.  Past Medical History  Diagnosis Date  . Dyslipidemia   . Hypertension   . Palpitations     PVCs/bigeminy on event in May 2009 revealing this was relatively asymptomatic  . Chest pain     a. 2011 Neg MV;  b. 01/2014 Cath: LM 20-30, LAD nl, D1 nl, LCX nl,  OM1 nl, RCA dom 30-36m, PD/PL nl, EF 65%->Med Rx.  . Degenerative joint disease   . Hx of varicella   . History of skin cancer     tafeen/ non melanoma.   . Thrombocytopenia   . Anemia   . Monoclonal gammopathies   . Right lower quadrant abdominal pain 07/19/2012    Recurrent  With nausea    This is new since she had ct for llq pain in [facility]    R/o appendiceal problem  Hernia  Get ct scan  And plan  Fu     . Diastolic CHF, acute on chronic 01/31/2012  . Atrial fibrillation     a. Dx 01/2014->Eliquis started.  . Cancer   . Pacemaker    . Dysrhythmia   . Family history of adverse reaction to anesthesia     " multiple family members have difficulty waking "    Current Outpatient Prescriptions  Medication Sig Dispense Refill  . acetaminophen (TYLENOL) 325 MG tablet Take 2 tablets (650 mg total) by mouth every 4 (four) hours as needed for headache or mild pain.    Marland Kitchen amiodarone (PACERONE) 200 MG tablet Take 1 tablet (200 mg total) by mouth daily. 30 tablet 6  . apixaban (ELIQUIS) 2.5 MG TABS tablet Take 1 tablet (2.5 mg total) by mouth 2 (two) times daily. 60 tablet 6  . ferrous sulfate 325 (65 FE) MG tablet Take 1 tablet (325 mg total) by mouth 2 (two) times daily with a meal. 180 tablet 1  . levothyroxine (SYNTHROID, LEVOTHROID) 75 MCG tablet Take 1 tablet (75 mcg total) by mouth daily. 90 tablet 1  . loratadine (CLARITIN) 10 MG tablet Take 10 mg by mouth daily as needed for allergies.     . metolazone (ZAROXOLYN) 2.5 MG tablet Take 1 tablet (2.5 mg total) by mouth as needed (for weight 136 lb or greater). 10 tablet 3  . nitroGLYCERIN (NITROSTAT) 0.4 MG SL tablet Place 1 tablet (0.4 mg total) under the tongue every 5 (five) minutes as needed. For chest pain 25 tablet 6  . pantoprazole (PROTONIX) 40 MG tablet Take 1 tablet (40 mg total) by mouth daily. 30 tablet 5  . potassium chloride SA (KLOR-CON M20) 20 MEQ tablet Take 2 tablets (40 mEq total) by mouth daily.    Marland Kitchen senna (SENOKOT) 8.6 MG TABS tablet Take 2 tablets (17.2 mg total) by mouth daily as needed for mild constipation. 30 tablet 6  . simvastatin (ZOCOR) 10 MG tablet Take 1 tablet (10 mg total) by mouth at bedtime. 30 tablet 6  . torsemide (DEMADEX) 20 MG tablet Take 2 tablets (40 mg total) by mouth daily.     No current facility-administered medications for this encounter.      Filed Vitals:   03/26/15 1515  BP: 104/58  Pulse: 79  Weight: 136 lb 12.8 oz (62.052 kg)  SpO2: 98%     PHYSICAL EXAM: General:  Elderly Frail appearing. No resp difficulty.  Daughter present HEENT: normal except  Neck: supple. JVP  5-6 Carotids 2+ bilaterally; no bruits. No lymphadenopathy or thryomegaly appreciated. Cor: PMI normal. Regular  rate &  rhythm. No rubs, gallops or murmurs. Lungs: clear Abdomen: soft, nontender, nondistended. No hepatosplenomegaly. No bruits or masses. Good bowel sounds. Extremities: no cyanosis, clubbing, rash, R and LLE severe varicose veins no edema.   Neuro: alert & orientedx3, cranial nerves grossly intact. Moves all 4 extremities w/o difficulty. Affect pleasant.   ASSESSMENT & PLAN:  1. Chronic  Diastolic Heart Failure - She has a very narrow euvolemic window. Volume status ok/   -Continue torsemide 40 mg once a day.  Take metolazone only for weight 135 or greater.  I reviewed BP and weight from [place].   Reinforced the need and importance of daily weights, a low sodium diet, and fluid restriction (less than 2 L a day). Instructed to call the HF clinic if weight increases more than 3 lbs overnight or 5 lbs in a week.  2.PAF -  symptomatic bradycardia s/p PPM.  Peviously thought to have failed amiodarone and had chronic AF. However in November in the hospital she was in NSR so amio restarted. - Continue amiodarone 200 mg daily. Continue Eliquis 2.5 mg twice a day. No bleeding problems.   3. Hypotension - Improved. Now off midodrine. Can restart as needed.  4. CKD stage III - Reviewed BMET form 03/23/15. Renal function ok.  5. Deconditioning- Continue HHPT.  6. Hypokalemia - increase kcl to 60 daily.  7. Chest pain - resolved. Echo with mild septal HK. Will treat medically. If CP recurs can consider f/u stress test.   Follow up in 4 weeks   Bensimhon, Daniel MD  3:28 PM

## 2015-03-26 NOTE — Patient Instructions (Signed)
Your physician recommends that you schedule a follow-up appointment in: 4 weeks  

## 2015-03-27 ENCOUNTER — Telehealth: Payer: Self-pay | Admitting: Internal Medicine

## 2015-03-27 ENCOUNTER — Encounter: Payer: Self-pay | Admitting: Internal Medicine

## 2015-03-27 NOTE — Telephone Encounter (Signed)
[name] called because she is dizzy with turning her head. She tells me she is not lightheaded and her heart-rate and blood pressure are normal/stable. She does not feel like she is going to pass out. Although she was recently hospitalized, she tells me she has not started any new medications. She's never had vertigo before but we discussed this as a possibility. Given stable BP/HR and overall she feels and sounds well she will call her PCP Monday AM. If she has any worrying symptoms/concerns or change in HR/BP/lightheadedness or near syncope she knows to call 911.   Jules Husbands, MD

## 2015-03-30 ENCOUNTER — Telehealth (HOSPITAL_COMMUNITY): Payer: Self-pay

## 2015-03-30 ENCOUNTER — Emergency Department (HOSPITAL_COMMUNITY): Payer: Medicare Other

## 2015-03-30 ENCOUNTER — Telehealth (HOSPITAL_COMMUNITY): Payer: Self-pay | Admitting: Vascular Surgery

## 2015-03-30 ENCOUNTER — Observation Stay (HOSPITAL_COMMUNITY)
Admission: EM | Admit: 2015-03-30 | Discharge: 2015-04-01 | Disposition: A | Discharge status: Home / Self Care | Payer: Medicare Other | Attending: Internal Medicine | Admitting: Internal Medicine

## 2015-03-30 ENCOUNTER — Encounter (HOSPITAL_COMMUNITY): Payer: Self-pay

## 2015-03-30 DIAGNOSIS — M1990 Unspecified osteoarthritis, unspecified site: Secondary | ICD-10-CM | POA: Diagnosis not present

## 2015-03-30 DIAGNOSIS — E039 Hypothyroidism, unspecified: Secondary | ICD-10-CM | POA: Diagnosis present

## 2015-03-30 DIAGNOSIS — I10 Essential (primary) hypertension: Secondary | ICD-10-CM | POA: Insufficient documentation

## 2015-03-30 DIAGNOSIS — I499 Cardiac arrhythmia, unspecified: Secondary | ICD-10-CM | POA: Diagnosis not present

## 2015-03-30 DIAGNOSIS — Z85828 Personal history of other malignant neoplasm of skin: Secondary | ICD-10-CM | POA: Insufficient documentation

## 2015-03-30 DIAGNOSIS — I5033 Acute on chronic diastolic (congestive) heart failure: Secondary | ICD-10-CM | POA: Diagnosis not present

## 2015-03-30 DIAGNOSIS — D472 Monoclonal gammopathy: Secondary | ICD-10-CM | POA: Diagnosis present

## 2015-03-30 DIAGNOSIS — R42 Dizziness and giddiness: Secondary | ICD-10-CM | POA: Insufficient documentation

## 2015-03-30 DIAGNOSIS — D696 Thrombocytopenia, unspecified: Secondary | ICD-10-CM | POA: Diagnosis present

## 2015-03-30 DIAGNOSIS — R011 Cardiac murmur, unspecified: Secondary | ICD-10-CM | POA: Insufficient documentation

## 2015-03-30 DIAGNOSIS — D649 Anemia, unspecified: Secondary | ICD-10-CM | POA: Diagnosis not present

## 2015-03-30 DIAGNOSIS — K219 Gastro-esophageal reflux disease without esophagitis: Secondary | ICD-10-CM | POA: Diagnosis not present

## 2015-03-30 DIAGNOSIS — R0789 Other chest pain: Secondary | ICD-10-CM | POA: Diagnosis not present

## 2015-03-30 DIAGNOSIS — E785 Hyperlipidemia, unspecified: Secondary | ICD-10-CM | POA: Insufficient documentation

## 2015-03-30 DIAGNOSIS — Z950 Presence of cardiac pacemaker: Secondary | ICD-10-CM | POA: Insufficient documentation

## 2015-03-30 DIAGNOSIS — Z79899 Other long term (current) drug therapy: Secondary | ICD-10-CM | POA: Insufficient documentation

## 2015-03-30 DIAGNOSIS — I4891 Unspecified atrial fibrillation: Secondary | ICD-10-CM | POA: Diagnosis present

## 2015-03-30 DIAGNOSIS — R079 Chest pain, unspecified: Secondary | ICD-10-CM | POA: Diagnosis present

## 2015-03-30 DIAGNOSIS — N183 Chronic kidney disease, stage 3 unspecified: Secondary | ICD-10-CM | POA: Diagnosis present

## 2015-03-30 DIAGNOSIS — Z7901 Long term (current) use of anticoagulants: Secondary | ICD-10-CM

## 2015-03-30 HISTORY — DX: Hypothyroidism, unspecified: E03.9

## 2015-03-30 HISTORY — DX: Gastro-esophageal reflux disease without esophagitis: K21.9

## 2015-03-30 LAB — BASIC METABOLIC PANEL
Anion gap: 10 (ref 5–15)
BUN: 34 mg/dL — AB (ref 6–20)
CO2: 27 mmol/L (ref 22–32)
CREATININE: 1.77 mg/dL — AB (ref 0.44–1.00)
Calcium: 9.2 mg/dL (ref 8.9–10.3)
Chloride: 105 mmol/L (ref 101–111)
GFR calc Af Amer: 28 mL/min — ABNORMAL LOW (ref >60)
GFR, EST NON AFRICAN AMERICAN: 24 mL/min — AB (ref >60)
GLUCOSE: 96 mg/dL (ref 65–99)
Potassium: 4.2 mmol/L (ref 3.5–5.1)
Sodium: 142 mmol/L (ref 135–145)

## 2015-03-30 LAB — CBC WITH DIFFERENTIAL/PLATELET
BASOS ABS: 0 10*3/uL (ref 0.0–0.1)
Basophils Relative: 1 % (ref 0–1)
EOS ABS: 0.1 10*3/uL (ref 0.0–0.7)
EOS PCT: 2 % (ref 0–5)
HCT: 37.1 % (ref 36.0–46.0)
HEMOGLOBIN: 11.9 g/dL — AB (ref 12.0–15.0)
Lymphocytes Relative: 21 % (ref 12–46)
Lymphs Abs: 0.9 10*3/uL (ref 0.7–4.0)
MCH: 31.4 pg (ref 26.0–34.0)
MCHC: 32.1 g/dL (ref 30.0–36.0)
MCV: 97.9 fL (ref 78.0–100.0)
MONO ABS: 0.5 10*3/uL (ref 0.1–1.0)
MONOS PCT: 11 % (ref 3–12)
Neutro Abs: 2.9 10*3/uL (ref 1.7–7.7)
Neutrophils Relative %: 65 % (ref 43–77)
Platelets: 120 10*3/uL — ABNORMAL LOW (ref 150–400)
RBC: 3.79 MIL/uL — ABNORMAL LOW (ref 3.87–5.11)
RDW: 13.3 % (ref 11.5–15.5)
WBC: 4.5 10*3/uL (ref 4.0–10.5)

## 2015-03-30 LAB — I-STAT TROPONIN, ED: Troponin i, poc: 0.04 ng/mL (ref 0.00–0.08)

## 2015-03-30 NOTE — Telephone Encounter (Signed)
Patient called back to report she woke up from a nap feeling chest pain, "sore" in nature, worse when she pushes on it or massages it.  Advised that this may be muskuloskeletal in nature, however since it is a constant pain that is not relieved by positioning, that she should call 911 and head to ED and be seen to be evaluated for possible MI if does not subside soon.  May also be anxiety related, but hard to tell over the phone.  Patient refuses to go to ED and wants to "wait it out".  [name]

## 2015-03-30 NOTE — Telephone Encounter (Signed)
Spoke with patient who says she still has a little generalized dizziness/lightheadedness and headache.  Has not taken her torsemide today, feels like she might be dehydrated, though her weight is stable.  Advised that this was ok for today and to drink an extra glass of water and keep activity to a minimum.  Will call us tomorrow morning to follow up to see if this helps before taking tomorrow's dose of torsemide.  [name]

## 2015-03-30 NOTE — ED Provider Notes (Signed)
CSN: [id number]     Arrival date & time 03/30/15  1835 History   First MD Initiated Contact with Patient 03/30/15 1956     Chief Complaint  Patient presents with  . Chest Pain   [name] is a 81 y.o. female with a past medical history significant for hypertension, hyperlipidemia, atrial fibrillation currently on Eliquis, heart failure status post pacemaker placement who presents with dizziness and chest pain. The patient reports that she had sudden onset of chest pain this evening while at rest. The patient reports that the chest pain was pleuritic and associated with his anus and lightheadedness. The patient denies shortness of breath, nausea, vomiting, or diaphoresis. The patient denied any radiation of her pain however, she says it is located in her central chest. The patient described the pain as an intense pressure.   The patient denies any fevers, chills, abdominal pain, constipation, diarrhea, dysuria or rashes.  The patient received aspirin with EMS but refused nitroglycerin because she says that it causes her to have hypotension.   (Consider location/radiation/quality/duration/timing/severity/associated sxs/prior Treatment) Patient is a 81 y.o. female presenting with chest pain. The history is provided by the patient and medical records. No language interpreter was used.  Chest Pain Pain location:  Substernal area Pain quality: crushing and pressure   Pain radiates to:  Does not radiate Pain radiates to the back: no   Pain severity:  Severe Onset quality:  Sudden Duration:  3 hours Timing:  Constant Progression:  Waxing and waning Chronicity:  New Context: at rest   Context: no stress and no trauma   Relieved by:  Nothing Worsened by:  Nothing tried Ineffective treatments:  None tried Associated symptoms: dizziness   Associated symptoms: no abdominal pain, no altered mental status, no anorexia, no back pain, no claudication, no cough, no diaphoresis, no fever, no  headache, no nausea, no numbness, no palpitations, no shortness of breath, no syncope and not vomiting   Risk factors: not female and no prior DVT/PE     Past Medical History  Diagnosis Date  . Dyslipidemia   . Hypertension   . Palpitations     PVCs/bigeminy on event in May 2009 revealing this was relatively asymptomatic  . Chest pain     a. 2011 Neg MV;  b. 01/2014 Cath: LM 20-30, LAD nl, D1 nl, LCX nl, OM1 nl, RCA dom 30-65m, PD/PL nl, EF 65%->Med Rx.  . Degenerative joint disease   . Hx of varicella   . History of skin cancer     tafeen/ non melanoma.   . Thrombocytopenia   . Anemia   . Monoclonal gammopathies   . Right lower quadrant abdominal pain 07/19/2012    Recurrent  With nausea    This is new since she had ct for llq pain in [facility]    R/o appendiceal problem  Hernia  Get ct scan  And plan  Fu     . Diastolic CHF, acute on chronic 01/31/2012  . Atrial fibrillation     a. Dx 01/2014->Eliquis started.  . Cancer   . Pacemaker   . Dysrhythmia   . Family history of adverse reaction to anesthesia     " multiple family members have difficulty waking "   Past Surgical History  Procedure Laterality Date  . Cholecystectomy  1999  . Cesarean section      times 2  . Shoulder surgery  1996    Right   . Cataract extraction  Bilateral  implantt  . Cardioversion N/A 02/26/2014    Procedure: CARDIOVERSION AT BEDSIDE;  Surgeon: Pixie Casino, MD;  Location: [facility];  Service: Cardiovascular;  Laterality: N/A;  . Cardioversion N/A 04/22/2014    Procedure: CARDIOVERSION (BEDSIDE);  Surgeon: [name], MD;  Location: [facility] OR;  Service: Cardiovascular;  Laterality: N/A;  . Pacemaker insertion  04/23/2014    STJ Assurity dual chamber pacemaker implanted by Dr Rayann Heman  . Insert / replace / remove pacemaker    . Esophagogastroduodenoscopy N/A 07/06/2014    Procedure: ESOPHAGOGASTRODUODENOSCOPY (EGD);  Surgeon: Ladene Artist, MD;  Location: [facility] ENDOSCOPY;  Service: Endoscopy;  Laterality:  N/A;  . Left heart catheterization with coronary angiogram N/A 01/29/2014    Procedure: LEFT HEART CATHETERIZATION WITH CORONARY ANGIOGRAM;  Surgeon: Blane Ohara, MD;  Location: [facility] CATH LAB;  Service: Cardiovascular;  Laterality: N/A;  . Permanent pacemaker insertion N/A 04/23/2014    Procedure: PERMANENT PACEMAKER INSERTION;  Surgeon: Evans Lance, MD;  Location: [facility] CATH LAB;  Service: Cardiovascular;  Laterality: N/A;  . Right heart catheterization N/A 09/22/2014    Procedure: RIGHT HEART CATH;  Surgeon: Jolaine Artist, MD;  Location: [facility] CATH LAB;  Service: Cardiovascular;  Laterality: N/A;   Family History  Problem Relation Age of Onset  . Coronary artery disease Father   . Heart disease Father   . Lung cancer    . Alzheimer's disease Mother   . Cancer Brother 24    lung cancer  . Heart attack Father    History  Substance Use Topics  . Smoking status: Never Smoker   . Smokeless tobacco: Never Used  . Alcohol Use: No   OB History    No data available     Review of Systems  Constitutional: Negative for fever, chills and diaphoresis.  HENT: Negative for congestion and rhinorrhea.   Respiratory: Positive for chest tightness. Negative for cough, shortness of breath, wheezing and stridor.   Cardiovascular: Positive for chest pain. Negative for palpitations, claudication and syncope.  Gastrointestinal: Negative for nausea, vomiting, abdominal pain, diarrhea, constipation and anorexia.  Musculoskeletal: Negative for back pain and neck pain.  Skin: Negative for rash and wound.  Neurological: Positive for dizziness and light-headedness. Negative for numbness and headaches.  Psychiatric/Behavioral: Negative for agitation.  All other systems reviewed and are negative.     Allergies  Tramadol  Home Medications   Prior to Admission medications   Medication Sig Start Date End Date Taking? Authorizing Provider  acetaminophen (TYLENOL) 325 MG tablet Take 2 tablets (650 mg  total) by mouth every 4 (four) hours as needed for headache or mild pain. 09/26/14   Isaiah Serge, NP  amiodarone (PACERONE) 200 MG tablet Take 1 tablet (200 mg total) by mouth daily. 11/06/14   Jolaine Artist, MD  apixaban (ELIQUIS) 2.5 MG TABS tablet Take 1 tablet (2.5 mg total) by mouth 2 (two) times daily. 12/30/14   Jolaine Artist, MD  ferrous sulfate 325 (65 FE) MG tablet Take 1 tablet (325 mg total) by mouth 2 (two) times daily with a meal. 11/21/14   Burnis Medin, MD  levothyroxine (SYNTHROID, LEVOTHROID) 75 MCG tablet Take 1 tablet (75 mcg total) by mouth daily. 11/21/14   Burnis Medin, MD  loratadine (CLARITIN) 10 MG tablet Take 10 mg by mouth daily as needed for allergies.     Historical Provider, MD  metolazone (ZAROXOLYN) 2.5 MG tablet Take 1 tablet (2.5 mg total) by  mouth as needed (for weight 136 lb or greater). 12/16/14   Jolaine Artist, MD  nitroGLYCERIN (NITROSTAT) 0.4 MG SL tablet Place 1 tablet (0.4 mg total) under the tongue every 5 (five) minutes as needed. For chest pain 12/02/13   Fay Records, MD  pantoprazole (PROTONIX) 40 MG tablet Take 1 tablet (40 mg total) by mouth daily. 08/11/14   Burnis Medin, MD  potassium chloride SA (KLOR-CON M20) 20 MEQ tablet Take 3 tablets (60 mEq total) by mouth daily. 03/26/15   Jolaine Artist, MD  senna (SENOKOT) 8.6 MG TABS tablet Take 2 tablets (17.2 mg total) by mouth daily as needed for mild constipation. 09/26/14   Isaiah Serge, NP  simvastatin (ZOCOR) 10 MG tablet Take 1 tablet (10 mg total) by mouth at bedtime. 11/06/14   Jolaine Artist, MD  torsemide (DEMADEX) 20 MG tablet Take 2 tablets (40 mg total) by mouth daily. 02/14/15   Almyra Deforest, PA   BP 127/58 mmHg  Pulse 74  Temp(Src) 98 F (36.7 C) (Oral)  Resp 29  SpO2 100% Physical Exam  Constitutional: She is oriented to person, place, and time. She appears well-developed and well-nourished. No distress.  HENT:  Head: Normocephalic and atraumatic.  Mouth/Throat:  No oropharyngeal exudate.  Eyes: Conjunctivae and EOM are normal. Pupils are equal, round, and reactive to light.  Neck: Normal range of motion.  Cardiovascular: Normal rate, regular rhythm and intact distal pulses.   Murmur heard. Pulmonary/Chest: Effort normal and breath sounds normal. No stridor. No respiratory distress. She has no wheezes. She has no rhonchi. She has no rales. She exhibits no tenderness.  Abdominal: Soft. Bowel sounds are normal. She exhibits no distension. There is no tenderness. There is no rebound.  Musculoskeletal: She exhibits no tenderness.  Neurological: She is alert and oriented to person, place, and time. She displays normal reflexes. No cranial nerve deficit.  Skin: Skin is warm. She is not diaphoretic. No pallor.  Psychiatric: She has a normal mood and affect.  Vitals reviewed.   ED Course  Procedures (including critical care time) Labs Review Labs Reviewed  BASIC METABOLIC PANEL - Abnormal; Notable for the following:    BUN 34 (*)    Creatinine, Ser 1.77 (*)    GFR calc non Af Amer 24 (*)    GFR calc Af Amer 28 (*)    All other components within normal limits  CBC WITH DIFFERENTIAL/PLATELET - Abnormal; Notable for the following:    RBC 3.79 (*)    Hemoglobin 11.9 (*)    Platelets 120 (*)    All other components within normal limits  I-STAT TROPOININ, ED    Imaging Review Dg Chest 2 View  03/30/2015   CLINICAL DATA:  Chest pain for 2 hours.  EXAM: CHEST  2 VIEW  COMPARISON:  03/17/2015  FINDINGS: Dual lead left-sided pacemaker remains in place. Borderline mild cardiomegaly. No pulmonary edema, confluent airspace disease, or pleural effusion. No pneumothorax. Increased bronchovascular markings are chronic and unchanged from prior exam. The bones are under mineralized.  IMPRESSION: Stable chronic findings without superimposed acute process.   Electronically Signed   By: Jeb Levering M.D.   On: 03/30/2015 21:15     EKG  Interpretation   Date/Time:  Monday Mar 30 2015 18:41:02 EDT Ventricular Rate:  71 PR Interval:  188 QRS Duration: 166 QT Interval:  504 QTC Calculation: 547 R Axis:   104 Text Interpretation:  AV SEQUENTIAL PACEMAKER Rightward axis Left  bundle  branch block Abnormal ECG Confirmed by Hazle Coca [phone]) on 03/30/2015  6:46:56 PM      MDM   Final diagnoses:  Chest pain, unspecified chest pain type   [name] is a 81 y.o. female with a past medical history significant for hypertension, hyperlipidemia, atrial fibrillation currently on Eliquis, heart failure status post pacemaker placement who presents with dizziness and chest pain.  The patient reports that she had chest pain several weeks ago and recently followed up with her cardiologist several days prior to arrival. At that time, the patient was informed that she needed a stress test if her chest pain returned.    Even the patient's history and previous cardiac abnormalities, the patient had a workup to investigate for possible ACS. The patient was found to have an initial troponin of 0.04. The patient's BMP was remarkable for an elevated creatinine of 1.77. The patient's CBC was unremarkable aside from a mildly low platelet count of 120 and a hemoglobin of 11.9. The patient's chest x-ray showed chronic findings without superimposed acute process. The patient's EKG appeared similar to prior.  The cardiology team was called and they recommended admission to the internal medicine service for further management of her chest pain and for ACS rule out and stress testing. The patient had improvement in her chest pain while resting in the emergency department. The patient did not have any episodes of hypoxia, tachycardia and appeared to be resting comfortably.  The patient was admitted in stable condition to the hospitalist service to continue her chest pain workup.  This patient was seen with Dr. Ralene Bathe, emergency medicine  attending.     Antony Blackbird, MD 03/31/15 1324  Quintella Reichert, MD 03/31/15 [phone]

## 2015-03-30 NOTE — Telephone Encounter (Signed)
PT left message with answering service on Friday .Marland Kitchen Pt is dizzy not getting better.. Please advise

## 2015-03-30 NOTE — ED Notes (Signed)
[facility]- pt coming from home with chest pain X2 hours. Pt refused nitro as well as IV start. 324 of ASA. Hx of heart failure. Left bundle branch block and NSR on monitor with EMS.

## 2015-03-31 ENCOUNTER — Inpatient Hospital Stay (HOSPITAL_COMMUNITY): Payer: Medicare Other

## 2015-03-31 ENCOUNTER — Encounter (HOSPITAL_COMMUNITY): Payer: Self-pay | Admitting: Internal Medicine

## 2015-03-31 DIAGNOSIS — I480 Paroxysmal atrial fibrillation: Secondary | ICD-10-CM

## 2015-03-31 DIAGNOSIS — R079 Chest pain, unspecified: Secondary | ICD-10-CM | POA: Diagnosis not present

## 2015-03-31 DIAGNOSIS — G8929 Other chronic pain: Secondary | ICD-10-CM | POA: Diagnosis not present

## 2015-03-31 DIAGNOSIS — E785 Hyperlipidemia, unspecified: Secondary | ICD-10-CM

## 2015-03-31 DIAGNOSIS — R42 Dizziness and giddiness: Secondary | ICD-10-CM | POA: Diagnosis not present

## 2015-03-31 DIAGNOSIS — R0789 Other chest pain: Secondary | ICD-10-CM | POA: Diagnosis not present

## 2015-03-31 DIAGNOSIS — Z7901 Long term (current) use of anticoagulants: Secondary | ICD-10-CM | POA: Diagnosis not present

## 2015-03-31 DIAGNOSIS — R011 Cardiac murmur, unspecified: Secondary | ICD-10-CM | POA: Diagnosis not present

## 2015-03-31 DIAGNOSIS — I481 Persistent atrial fibrillation: Secondary | ICD-10-CM | POA: Diagnosis not present

## 2015-03-31 DIAGNOSIS — D696 Thrombocytopenia, unspecified: Secondary | ICD-10-CM

## 2015-03-31 DIAGNOSIS — D472 Monoclonal gammopathy: Secondary | ICD-10-CM

## 2015-03-31 DIAGNOSIS — E039 Hypothyroidism, unspecified: Secondary | ICD-10-CM

## 2015-03-31 DIAGNOSIS — Z950 Presence of cardiac pacemaker: Secondary | ICD-10-CM

## 2015-03-31 DIAGNOSIS — N183 Chronic kidney disease, stage 3 (moderate): Secondary | ICD-10-CM | POA: Diagnosis not present

## 2015-03-31 LAB — CBC
HCT: 36.8 % (ref 36.0–46.0)
HEMOGLOBIN: 12.1 g/dL (ref 12.0–15.0)
MCH: 32 pg (ref 26.0–34.0)
MCHC: 32.9 g/dL (ref 30.0–36.0)
MCV: 97.4 fL (ref 78.0–100.0)
Platelets: 113 10*3/uL — ABNORMAL LOW (ref 150–400)
RBC: 3.78 MIL/uL — ABNORMAL LOW (ref 3.87–5.11)
RDW: 13.2 % (ref 11.5–15.5)
WBC: 3.5 10*3/uL — ABNORMAL LOW (ref 4.0–10.5)

## 2015-03-31 LAB — COMPREHENSIVE METABOLIC PANEL
ALBUMIN: 3.1 g/dL — AB (ref 3.5–5.0)
ALT: 18 U/L (ref 14–54)
AST: 20 U/L (ref 15–41)
Alkaline Phosphatase: 87 U/L (ref 38–126)
Anion gap: 8 (ref 5–15)
BILIRUBIN TOTAL: 0.7 mg/dL (ref 0.3–1.2)
BUN: 29 mg/dL — ABNORMAL HIGH (ref 6–20)
CO2: 27 mmol/L (ref 22–32)
Calcium: 9.5 mg/dL (ref 8.9–10.3)
Chloride: 107 mmol/L (ref 101–111)
Creatinine, Ser: 1.69 mg/dL — ABNORMAL HIGH (ref 0.44–1.00)
GFR calc Af Amer: 30 mL/min — ABNORMAL LOW (ref >60)
GFR calc non Af Amer: 26 mL/min — ABNORMAL LOW (ref >60)
Glucose, Bld: 98 mg/dL (ref 65–99)
POTASSIUM: 3.8 mmol/L (ref 3.5–5.1)
Sodium: 142 mmol/L (ref 135–145)
Total Protein: 5.9 g/dL — ABNORMAL LOW (ref 6.5–8.1)

## 2015-03-31 LAB — TROPONIN I
TROPONIN I: 0.05 ng/mL — AB (ref <0.031)
Troponin I: 0.05 ng/mL — ABNORMAL HIGH (ref <0.031)
Troponin I: 0.05 ng/mL — ABNORMAL HIGH (ref <0.031)

## 2015-03-31 LAB — GLUCOSE, CAPILLARY: Glucose-Capillary: 90 mg/dL (ref 65–99)

## 2015-03-31 LAB — PROTIME-INR
INR: 1.28 (ref 0.00–1.49)
Prothrombin Time: 16.2 seconds — ABNORMAL HIGH (ref 11.6–15.2)

## 2015-03-31 LAB — BRAIN NATRIURETIC PEPTIDE: B Natriuretic Peptide: 442.4 pg/mL — ABNORMAL HIGH (ref 0.0–100.0)

## 2015-03-31 MED ORDER — ACETAMINOPHEN 325 MG PO TABS
ORAL_TABLET | ORAL | Status: AC
  Filled 2015-03-31: qty 2

## 2015-03-31 MED ORDER — SIMVASTATIN 10 MG PO TABS
10.0000 mg | ORAL_TABLET | Freq: Every day | ORAL | Status: DC
  Administered 2015-03-31: 10 mg via ORAL
  Filled 2015-03-31: qty 1

## 2015-03-31 MED ORDER — AMIODARONE HCL 200 MG PO TABS
200.0000 mg | ORAL_TABLET | Freq: Every day | ORAL | Status: DC
  Administered 2015-03-31: 200 mg via ORAL
  Filled 2015-03-31: qty 1

## 2015-03-31 MED ORDER — SODIUM CHLORIDE 0.9 % IJ SOLN
3.0000 mL | Freq: Two times a day (BID) | INTRAMUSCULAR | Status: DC
Start: 2015-03-31 — End: 2015-04-01
  Administered 2015-03-31 (×3): 3 mL via INTRAVENOUS

## 2015-03-31 MED ORDER — ACETAMINOPHEN 650 MG RE SUPP: 650.0000 mg | Freq: Four times a day (QID) | RECTAL | Status: DC | PRN

## 2015-03-31 MED ORDER — AMIODARONE HCL 200 MG PO TABS
200.0000 mg | ORAL_TABLET | Freq: Once | ORAL | Status: AC
  Administered 2015-03-31: 200 mg via ORAL
  Filled 2015-03-31: qty 1

## 2015-03-31 MED ORDER — CARVEDILOL 3.125 MG PO TABS: 3.1250 mg | ORAL_TABLET | Freq: Two times a day (BID) | ORAL | Status: DC

## 2015-03-31 MED ORDER — ACETAMINOPHEN 325 MG PO TABS: 650.0000 mg | ORAL_TABLET | ORAL | Status: DC | PRN

## 2015-03-31 MED ORDER — AMIODARONE HCL 200 MG PO TABS
400.0000 mg | ORAL_TABLET | Freq: Two times a day (BID) | ORAL | Status: DC
  Administered 2015-03-31 – 2015-04-01 (×2): 400 mg via ORAL
  Filled 2015-03-31 (×2): qty 2

## 2015-03-31 MED ORDER — MORPHINE SULFATE 2 MG/ML IJ SOLN: 1.0000 mg | INTRAMUSCULAR | Status: DC | PRN

## 2015-03-31 MED ORDER — ONDANSETRON HCL 4 MG/2ML IJ SOLN: 4.0000 mg | Freq: Four times a day (QID) | INTRAMUSCULAR | Status: DC | PRN

## 2015-03-31 MED ORDER — REGADENOSON 0.4 MG/5ML IV SOLN
0.4000 mg | Freq: Once | INTRAVENOUS | Status: AC
  Administered 2015-03-31: 0.4 mg via INTRAVENOUS
  Filled 2015-03-31: qty 5

## 2015-03-31 MED ORDER — TORSEMIDE 20 MG PO TABS
40.0000 mg | ORAL_TABLET | Freq: Every day | ORAL | Status: DC
Start: 2015-03-31 — End: 2015-04-01
  Administered 2015-03-31 – 2015-04-01 (×2): 40 mg via ORAL
  Filled 2015-03-31 (×2): qty 2

## 2015-03-31 MED ORDER — POTASSIUM CHLORIDE CRYS ER 20 MEQ PO TBCR
60.0000 meq | EXTENDED_RELEASE_TABLET | Freq: Once | ORAL | Status: AC
  Administered 2015-03-31: 60 meq via ORAL
  Filled 2015-03-31: qty 3

## 2015-03-31 MED ORDER — PANTOPRAZOLE SODIUM 40 MG PO TBEC
40.0000 mg | DELAYED_RELEASE_TABLET | Freq: Every day | ORAL | Status: DC
  Administered 2015-03-31 – 2015-04-01 (×2): 40 mg via ORAL
  Filled 2015-03-31 (×2): qty 1

## 2015-03-31 MED ORDER — ACETAMINOPHEN 325 MG PO TABS
650.0000 mg | ORAL_TABLET | Freq: Four times a day (QID) | ORAL | Status: DC | PRN
  Administered 2015-03-31 – 2015-04-01 (×2): 650 mg via ORAL

## 2015-03-31 MED ORDER — TECHNETIUM TC 99M SESTAMIBI GENERIC - CARDIOLITE
10.0000 | Freq: Once | INTRAVENOUS | Status: AC | PRN
  Administered 2015-03-31: 10 via INTRAVENOUS

## 2015-03-31 MED ORDER — APIXABAN 2.5 MG PO TABS
2.5000 mg | ORAL_TABLET | Freq: Two times a day (BID) | ORAL | Status: DC
  Administered 2015-03-31 – 2015-04-01 (×3): 2.5 mg via ORAL
  Filled 2015-03-31 (×3): qty 1

## 2015-03-31 MED ORDER — SODIUM CHLORIDE 0.9 % IV SOLN: INTRAVENOUS | Status: AC

## 2015-03-31 MED ORDER — ALUM & MAG HYDROXIDE-SIMETH 200-200-20 MG/5ML PO SUSP: 30.0000 mL | Freq: Four times a day (QID) | ORAL | Status: DC | PRN

## 2015-03-31 MED ORDER — NITROGLYCERIN 0.4 MG SL SUBL: 0.4000 mg | SUBLINGUAL_TABLET | SUBLINGUAL | Status: DC | PRN

## 2015-03-31 MED ORDER — SENNA 8.6 MG PO TABS: 2.0000 | ORAL_TABLET | Freq: Every day | ORAL | Status: DC | PRN

## 2015-03-31 MED ORDER — REGADENOSON 0.4 MG/5ML IV SOLN
INTRAVENOUS | Status: AC
  Administered 2015-03-31: 0.4 mg via INTRAVENOUS
  Filled 2015-03-31: qty 5

## 2015-03-31 MED ORDER — LORATADINE 10 MG PO TABS
10.0000 mg | ORAL_TABLET | Freq: Every day | ORAL | Status: DC | PRN
  Administered 2015-03-31 (×2): 10 mg via ORAL
  Filled 2015-03-31 (×2): qty 1

## 2015-03-31 MED ORDER — FERROUS SULFATE 325 (65 FE) MG PO TABS
325.0000 mg | ORAL_TABLET | Freq: Two times a day (BID) | ORAL | Status: DC
  Administered 2015-03-31 – 2015-04-01 (×3): 325 mg via ORAL
  Filled 2015-03-31 (×3): qty 1

## 2015-03-31 MED ORDER — LEVOTHYROXINE SODIUM 75 MCG PO TABS
75.0000 ug | ORAL_TABLET | Freq: Every day | ORAL | Status: DC
  Administered 2015-03-31 – 2015-04-01 (×2): 75 ug via ORAL
  Filled 2015-03-31 (×3): qty 1

## 2015-03-31 MED ORDER — ONDANSETRON HCL 4 MG PO TABS: 4.0000 mg | ORAL_TABLET | Freq: Four times a day (QID) | ORAL | Status: DC | PRN

## 2015-03-31 NOTE — Progress Notes (Signed)
UR Completed Kuuipo Anzaldo Graves-Bigelow, RN,BSN [phone]  

## 2015-03-31 NOTE — Discharge Instructions (Addendum)
Information on my medicine - ELIQUIS (apixaban)  This medication education was reviewed with me or my healthcare representative as part of my discharge preparation.  The pharmacist that spoke with me during my hospital stay was:  Wayland Salinas, [facility]  Why was Eliquis prescribed for you? Eliquis was prescribed for you to reduce the risk of a blood clot forming that can cause a stroke if you have a medical condition called atrial fibrillation (a type of irregular heartbeat).  What do You need to know about Eliquis ? Take your Eliquis TWICE DAILY - one tablet in the morning and one tablet in the evening with or without food. If you have difficulty swallowing the tablet whole please discuss with your pharmacist how to take the medication safely.  Take Eliquis exactly as prescribed by your doctor and DO NOT stop taking Eliquis without talking to the doctor who prescribed the medication.  Stopping may increase your risk of developing a stroke.  Refill your prescription before you run out.  After discharge, you should have regular check-up appointments with your healthcare provider that is prescribing your Eliquis.  In the future your dose may need to be changed if your kidney function or weight changes by a significant amount or as you get older.  What do you do if you miss a dose? If you miss a dose, take it as soon as you remember on the same day and resume taking twice daily.  Do not take more than one dose of ELIQUIS at the same time to make up a missed dose.  Important Safety Information A possible side effect of Eliquis is bleeding. You should call your healthcare provider right away if you experience any of the following: ? Bleeding from an injury or your nose that does not stop. ? Unusual colored urine (red or dark brown) or unusual colored stools (red or black). ? Unusual bruising for unknown reasons. ? A serious fall or if you hit your head (even if there is no  bleeding).  Some medicines may interact with Eliquis and might increase your risk of bleeding or clotting while on Eliquis. To help avoid this, consult your healthcare provider or pharmacist prior to using any new prescription or non-prescription medications, including herbals, vitamins, non-steroidal anti-inflammatory drugs (NSAIDs) and supplements.  This website has more information on Eliquis (apixaban): http://[email or web address].com/eliquis/home   Heart Failure Heart failure is a condition in which the heart has trouble pumping blood. This means your heart does not pump blood efficiently for your body to work well. In some cases of heart failure, fluid may back up into your lungs or you may have swelling (edema) in your lower legs. Heart failure is usually a long-term (chronic) condition. It is important for you to take good care of yourself and follow your health care provider's treatment plan. CAUSES  Some health conditions can cause heart failure. Those health conditions include:  High blood pressure (hypertension). Hypertension causes the heart muscle to work harder than normal. When pressure in the blood vessels is high, the heart needs to pump (contract) with more force in order to circulate blood throughout the body. High blood pressure eventually causes the heart to become stiff and weak.  Coronary artery disease (CAD). CAD is the buildup of cholesterol and fat (plaque) in the arteries of the heart. The blockage in the arteries deprives the heart muscle of oxygen and blood. This can cause chest pain and may lead to a heart attack. High blood pressure can  also contribute to CAD.  Heart attack (myocardial infarction). A heart attack occurs when one or more arteries in the heart become blocked. The loss of oxygen damages the muscle tissue of the heart. When this happens, part of the heart muscle dies. The injured tissue does not contract as well and weakens the heart's ability to pump  blood.  Abnormal heart valves. When the heart valves do not open and close properly, it can cause heart failure. This makes the heart muscle pump harder to keep the blood flowing.  Heart muscle disease (cardiomyopathy or myocarditis). Heart muscle disease is damage to the heart muscle from a variety of causes. These can include drug or alcohol abuse, infections, or unknown reasons. These can increase the risk of heart failure.  Lung disease. Lung disease makes the heart work harder because the lungs do not work properly. This can cause a strain on the heart, leading it to fail.  Diabetes. Diabetes increases the risk of heart failure. High blood sugar contributes to high fat (lipid) levels in the blood. Diabetes can also cause slow damage to tiny blood vessels that carry important nutrients to the heart muscle. When the heart does not get enough oxygen and food, it can cause the heart to become weak and stiff. This leads to a heart that does not contract efficiently.  Other conditions can contribute to heart failure. These include abnormal heart rhythms, thyroid problems, and low blood counts (anemia). Certain unhealthy behaviors can increase the risk of heart failure, including:  Being overweight.  Smoking or chewing tobacco.  Eating foods high in fat and cholesterol.  Abusing illicit drugs or alcohol.  Lacking physical activity. SYMPTOMS  Heart failure symptoms may vary and can be hard to detect. Symptoms may include:  Shortness of breath with activity, such as climbing stairs.  Persistent cough.  Swelling of the feet, ankles, legs, or abdomen.  Unexplained weight gain.  Difficulty breathing when lying flat (orthopnea).  Waking from sleep because of the need to sit up and get more air.  Rapid heartbeat.  Fatigue and loss of energy.  Feeling light-headed, dizzy, or close to fainting.  Loss of appetite.  Nausea.  Increased urination during the night  (nocturia). DIAGNOSIS  A diagnosis of heart failure is based on your history, symptoms, physical examination, and diagnostic tests. Diagnostic tests for heart failure may include:  Echocardiography.  Electrocardiography.  Chest X-ray.  Blood tests.  Exercise stress test.  Cardiac angiography.  Radionuclide scans. TREATMENT  Treatment is aimed at managing the symptoms of heart failure. Medicines, behavioral changes, or surgical intervention may be necessary to treat heart failure.  Medicines to help treat heart failure may include:  Angiotensin-converting enzyme (ACE) inhibitors. This type of medicine blocks the effects of a blood protein called angiotensin-converting enzyme. ACE inhibitors relax (dilate) the blood vessels and help lower blood pressure.  Angiotensin receptor blockers (ARBs). This type of medicine blocks the actions of a blood protein called angiotensin. Angiotensin receptor blockers dilate the blood vessels and help lower blood pressure.  Water pills (diuretics). Diuretics cause the kidneys to remove salt and water from the blood. The extra fluid is removed through urination. This loss of extra fluid lowers the volume of blood the heart pumps.  Beta blockers. These prevent the heart from beating too fast and improve heart muscle strength.  Digitalis. This increases the force of the heartbeat.  Healthy behavior changes include:  Obtaining and maintaining a healthy weight.  Stopping smoking or chewing tobacco.  Eating heart-healthy foods.  Limiting or avoiding alcohol.  Stopping illicit drug use.  Physical activity as directed by your health care provider.  Surgical treatment for heart failure may include:  A procedure to open blocked arteries, repair damaged heart valves, or remove damaged heart muscle tissue.  A pacemaker to improve heart muscle function and control certain abnormal heart rhythms.  An internal cardioverter defibrillator to treat  certain serious abnormal heart rhythms.  A left ventricular assist device (LVAD) to assist the pumping ability of the heart. HOME CARE INSTRUCTIONS   Take medicines only as directed by your health care provider. Medicines are important in reducing the workload of your heart, slowing the progression of heart failure, and improving your symptoms.  Do not stop taking your medicine unless directed by your health care provider.  Do not skip any dose of medicine.  Refill your prescriptions before you run out of medicine. Your medicines are needed every day.  Engage in moderate physical activity if directed by your health care provider. Moderate physical activity can benefit some people. The elderly and people with severe heart failure should consult with a health care provider for physical activity recommendations.  Eat heart-healthy foods. Food choices should be free of trans fat and low in saturated fat, cholesterol, and salt (sodium). Healthy choices include fresh or frozen fruits and vegetables, fish, lean meats, legumes, fat-free or low-fat dairy products, and whole grain or high fiber foods. Talk to a dietitian to learn more about heart-healthy foods.  Limit sodium if directed by your health care provider. Sodium restriction may reduce symptoms of heart failure in some people. Talk to a dietitian to learn more about heart-healthy seasonings.  Use healthy cooking methods. Healthy cooking methods include roasting, grilling, broiling, baking, poaching, steaming, or stir-frying. Talk to a dietitian to learn more about healthy cooking methods.  Limit fluids if directed by your health care provider. Fluid restriction may reduce symptoms of heart failure in some people.  Weigh yourself every day. Daily weights are important in the early recognition of excess fluid. You should weigh yourself every morning after you urinate and before you eat breakfast. Wear the same amount of clothing each time you  weigh yourself. Record your daily weight. Provide your health care provider with your weight record.  Monitor and record your blood pressure if directed by your health care provider.  Check your pulse if directed by your health care provider.  Lose weight if directed by your health care provider. Weight loss may reduce symptoms of heart failure in some people.  Stop smoking or chewing tobacco. Nicotine makes your heart work harder by causing your blood vessels to constrict. Do not use nicotine gum or patches before talking to your health care provider.  Keep all follow-up visits as directed by your health care provider. This is important.  Limit alcohol intake to no more than 1 drink per day for nonpregnant women and 2 drinks per day for men. One drink equals 12 ounces of beer, 5 ounces of wine, or 1 ounces of hard liquor. Drinking more than that is harmful to your heart. Tell your health care provider if you drink alcohol several times a week. Talk with your health care provider about whether alcohol is safe for you. If your heart has already been damaged by alcohol or you have severe heart failure, drinking alcohol should be stopped completely.  Stop illicit drug use.  Stay up-to-date with immunizations. It is especially important to prevent respiratory infections  through current pneumococcal and influenza immunizations.  Manage other health conditions such as hypertension, diabetes, thyroid disease, or abnormal heart rhythms as directed by your health care provider.  Learn to manage stress.  Plan rest periods when fatigued.  Learn strategies to manage high temperatures. If the weather is extremely hot:  Avoid vigorous physical activity.  Use air conditioning or fans or seek a cooler location.  Avoid caffeine and alcohol.  Wear loose-fitting, lightweight, and light-colored clothing.  Learn strategies to manage cold temperatures. If the weather is extremely cold:  Avoid vigorous  physical activity.  Layer clothes.  Wear mittens or gloves, a hat, and a scarf when going outside.  Avoid alcohol.  Obtain ongoing education and support as needed.  Participate in or seek rehabilitation as needed to maintain or improve independence and quality of life. SEEK MEDICAL CARE IF:   Your weight increases by 03 lb/1.4 kg in 1 day or 05 lb/2.3 kg in a week.  You have increasing shortness of breath that is unusual for you.  You are unable to participate in your usual physical activities.  You tire easily.  You cough more than normal, especially with physical activity.  You have any or more swelling in areas such as your hands, feet, ankles, or abdomen.  You are unable to sleep because it is hard to breathe.  You feel like your heart is beating fast (palpitations).  You become dizzy or light-headed upon standing up. SEEK IMMEDIATE MEDICAL CARE IF:   You have difficulty breathing.  There is a change in mental status such as decreased alertness or difficulty with concentration.  You have a pain or discomfort in your chest.  You have an episode of fainting (syncope). MAKE SURE YOU:   Understand these instructions.  Will watch your condition.  Will get help right away if you are not doing well or get worse. Document Released: 10/31/2005 Document Revised: 03/17/2014 Document Reviewed: 11/30/2012 [facility] Patient Information 2015 [place], [place]. This information is not intended to replace advice given to you by your health care provider. Make sure you discuss any questions you have with your health care provider.

## 2015-03-31 NOTE — Evaluation (Signed)
Physical Therapy Evaluation Patient Details Name: [name] MRN: [id number] DOB: 03-09-1925 Today's Date: 03/31/2015   History of Present Illness  81 y.o. female with PMH of hypertension, hyperlipidemia, GERD, hypothyroidism, pacemaker placement, history of melanoma, history of MGUS, combined diastolic and systolic congestive heart failure (EF 45-50% with grade 2 diastolic dysfunction, atrial fibrillation on Eliquis, chronic kidney disease-stage III, who was admitted with chest pain.  Clinical Impression  Patient presents with decreased mobility due to deficits listed  In PT problem list below.  She will benefit from skilled PT in the acute setting and reports would like HHPT Arville Go) to return, even though just recently finished with them, to help her return to baseline.    Follow Up Recommendations Home health PT;Supervision/Assistance - 24 hour    Equipment Recommendations  None recommended by PT    Recommendations for Other Services       Precautions / Restrictions Precautions Precautions: Fall      Mobility  Bed Mobility Overal bed mobility: Modified Independent                Transfers Overall transfer level: Modified independent                  Ambulation/Gait Ambulation/Gait assistance: Supervision;Min guard Ambulation Distance (Feet): 150 Feet Assistive device: Straight cane Gait Pattern/deviations: Step-through pattern;Decreased stride length;Antalgic;Decreased stance time - right     General Gait Details: possible right trendelenberg; pt reports due to back pain (worse since lying still for testing today) assist for safety  Stairs            Wheelchair Mobility    Modified Rankin (Stroke Patients Only)       Balance Overall balance assessment: Needs assistance           Standing balance-Leahy Scale: Fair Standing balance comment: wide base of support in static standing (and at times bracing back of leg against bed)  while fixing heart monitor leads                             Pertinent Vitals/Pain Pain Assessment: Faces Faces Pain Scale: Hurts little more Pain Location: right lower back with ambulation Pain Descriptors / Indicators: Aching Pain Intervention(s): Monitored during session;Repositioned    Home Living Family/patient expects to be discharged to:: Private residence Living Arrangements: Alone Available Help at Discharge: Family;Available 24 hours/day (daughter can stay if needed) Type of Home: House Home Access: Stairs to enter Entrance Stairs-Rails: Chemical engineer of Steps: 4 Home Layout: One level Home Equipment: Walker - 4 wheels;Cane - single point;Grab bars - toilet;Grab bars - tub/shower;Bedside commode;Tub bench      Prior Function Level of Independence: Independent with assistive device(s)         Comments: used cane at times     Hand Dominance   Dominant Hand: Right    Extremity/Trunk Assessment               Lower Extremity Assessment: Generalized weakness      Cervical / Trunk Assessment: Kyphotic  Communication   Communication: HOH  Cognition Arousal/Alertness: Awake/alert Behavior During Therapy: WFL for tasks assessed/performed Overall Cognitive Status: Within Functional Limits for tasks assessed                      General Comments      Exercises        Assessment/Plan    PT Assessment Patient  needs continued PT services  PT Diagnosis Abnormality of gait   PT Problem List Decreased activity tolerance;Decreased balance;Decreased mobility;Decreased strength  PT Treatment Interventions DME instruction;Therapeutic activities;Patient/family education;Functional mobility training;Therapeutic exercise;Gait training;Stair training   PT Goals (Current goals can be found in the Care Plan section) Acute Rehab PT Goals Patient Stated Goal: To go home PT Goal Formulation: With patient Time For Goal  Achievement: 04/07/15 Potential to Achieve Goals: Good    Frequency Min 3X/week   Barriers to discharge   states daughter can stay with her    Co-evaluation               End of Session   Activity Tolerance: Patient tolerated treatment well Patient left: in bed;with call bell/phone within reach      Functional Assessment Tool Used: Cinical Judgement Functional Limitation: Mobility: Walking and moving around Mobility: Walking and Moving Around Current Status (L4650): At least 1 percent but less than 20 percent impaired, limited or restricted Mobility: Walking and Moving Around Goal Status [phone]): At least 1 percent but less than 20 percent impaired, limited or restricted    Time: 1447-1459 PT Time Calculation (min) (ACUTE ONLY): 12 min   Charges:   PT Evaluation $Initial PT Evaluation Tier I: 1 Procedure     PT G Codes:   PT G-Codes **NOT FOR INPATIENT CLASS** Functional Assessment Tool Used: Cinical Judgement Functional Limitation: Mobility: Walking and moving around Mobility: Walking and Moving Around Current Status (K8127): At least 1 percent but less than 20 percent impaired, limited or restricted Mobility: Walking and Moving Around Goal Status [phone]): At least 1 percent but less than 20 percent impaired, limited or restricted    [facility] 03/31/2015, 3:13 PM  [place], [place] 03/31/2015

## 2015-03-31 NOTE — Progress Notes (Signed)
Seen and briefly discussed symptoms. Chest pain seems more right-sided, sometimes radiates across chest. Similar to symptoms in the past. Troponin remains at 0.05 (was this way in her previous admission). No new ischemic EKG changes. Seems unlikely to be ACS based on cath last year, however, she has had multiple admissions for this. Plan previously was for stress test if she has recurrence, will order that today.   Pixie Casino, MD, [facility] Attending Cardiologist [place]

## 2015-03-31 NOTE — Progress Notes (Signed)
   [name] presented for a lexiscan cardiolite today.  No immediate complications.  Stress imaging pending.  Murray Hodgkins, NP 03/31/2015, 12:31 PM

## 2015-03-31 NOTE — Evaluation (Addendum)
Occupational Therapy Evaluation Patient Details Name: [name] MRN: [id number] DOB: 1925/01/06 Today's Date: 03/31/2015    History of Present Illness 81 y.o. female with PMH of hypertension, hyperlipidemia, GERD, hypothyroidism, pacemaker placement, history of melanoma, history of MGUS, combined diastolic and systolic congestive heart failure (EF 45-50% with grade 2 diastolic dysfunction, atrial fibrillation on Eliquis, chronic kidney disease-stage III, who was admitted with chest pain.   Clinical Impression   Pt admitted with above. Pt independent with ADLs, PTA. Feel pt will benefit from acute OT to increase independence prior to d/c. See vital section below as pt HR was extremely tachy.    Follow Up Recommendations  Home health OT;Supervision/Assistance - 24 hour (initially)   Equipment Recommendations  None recommended by OT    Recommendations for Other Services       Precautions / Restrictions Precautions Precautions: Fall Restrictions Weight Bearing Restrictions: No      Mobility Bed Mobility Overal bed mobility: Modified Independent                Transfers Overall transfer level: Modified independent                    Balance  Min guard for ambulation-Balance not formally assessed. Pt did not lose balance but seemed unsteady.                                          ADL Overall ADL's : Needs assistance/impaired     Grooming: Set up;Sitting               Lower Body Dressing: Set up;Supervision/safety;Sit to/from stand   Toilet Transfer: Min guard;Ambulation (bed/chair)           Functional mobility during ADLs: Min guard General ADL Comments: Educated on energy conservation techniques and safety such as sitting for most of LB dressing and safe footwear as well as rugs/items on floor.  Talked with pt about having someone stay with her initially.     Vision     Perception     Praxis       Pertinent Vitals/Pain Pain Assessment: 0-10 Pain Score:  (5-6) Pain Location: left lower side Pain Descriptors / Indicators: Aching Pain Intervention(s): Monitored during session;Repositioned;Patient requesting pain meds-RN notified   HR up to at least 180s in session according to telemetry. Nurse notified. HR stable at end of session.     Hand Dominance Right   Extremity/Trunk Assessment Upper Extremity Assessment Upper Extremity Assessment: Overall WFL for tasks assessed   Lower Extremity Assessment Lower Extremity Assessment: Defer to PT evaluation       Communication Communication Communication: HOH   Cognition Arousal/Alertness: Awake/alert Behavior During Therapy: WFL for tasks assessed/performed Overall Cognitive Status: Within Functional Limits for tasks assessed                     General Comments       Exercises       Shoulder Instructions      Home Living Family/patient expects to be discharged to:: Private residence Living Arrangements: Alone Available Help at Discharge: Family;Available 24 hours/day (states her daughter can stay with her if needed) Type of Home: House Home Access: Stairs to enter [employer] of Steps: 4 Entrance Stairs-Rails: Can reach both;Left;Right Home Layout: One level     Bathroom Shower/Tub: Tub/shower unit;Curtain  Home Equipment: [place] - 4 wheels;Cane - single point;Grab bars - toilet;Grab bars - tub/shower;Bedside commode;Tub bench          Prior Functioning/Environment Level of Independence: Independent with assistive device(s)        Comments: used cane at times    OT Diagnosis: Acute pain   OT Problem List: Decreased knowledge of precautions;Decreased knowledge of use of DME or AE;Cardiopulmonary status limiting activity;Pain;Decreased strength;Decreased activity tolerance   OT Treatment/Interventions: Self-care/ADL training;DME and/or AE instruction;Therapeutic  activities;Patient/family education;Balance training;Energy conservation    OT Goals(Current goals can be found in the care plan section) Acute Rehab OT Goals Patient Stated Goal: not stated OT Goal Formulation: With patient Time For Goal Achievement: 04/07/15 Potential to Achieve Goals: Good ADL Goals Pt Will Perform Upper Body Bathing: with modified independence;sitting;standing Pt Will Perform Lower Body Bathing: with modified independence;sit to/from stand Pt Will Perform Lower Body Dressing: with modified independence;sit to/from stand Pt Will Transfer to Toilet: with modified independence;ambulating  OT Frequency: Min 2X/week   Barriers to D/C:            Co-evaluation              End of Session Equipment Utilized During Treatment: Gait belt Nurse Communication:  (HR/requesting pain meds/discharge recommendation)  Activity Tolerance: Other (comment) (increased HR) Patient left: in chair;with call bell/phone within reach   Time: 0935-0949 OT Time Calculation (min): 14 min Charges:  OT General Charges $OT Visit: 1 Procedure OT Evaluation $Initial OT Evaluation Tier I: 1 Procedure G-CodesBenito Mccreedy OTR/L [id number] 03/31/2015, 10:22 AM

## 2015-03-31 NOTE — Progress Notes (Addendum)
Patient seen and examined Agree with Ivor Costa, MD, assessment and plan Appreciate cardiology input Patient found to be extremely tachycardic with heart rate in the 170s to 180s with ambulation with physical therapy  Plan Cardiology to interrogate pacemaker, discussed with Dr. Debara Pickett, he will adjust meds  Patient found to be in atrial fibrillation with rapid ventricular response with ambulation, (conveyed to Dr Debara Pickett )Consider low-dose Coreg for better rate control Chest pain, stress test today nuclear images pending Chronic kidney disease, renal function and baseline Anticipate discharge tomorrow

## 2015-03-31 NOTE — ED Notes (Signed)
MD at bedside. 

## 2015-03-31 NOTE — H&P (Signed)
[facility] History and Physical  [name] PNT:[id number] DOB: [birth year] DOA: 03/30/2015  Referring physician: ED physician PCP: [name], MD  Specialists:   Chief Complaint: chest pain  HPI: [name] is a 81 y.o. female with PMH of hypertension, hyperlipidemia, GERD, hypothyroidism, pacemaker placement, history of melanoma, history of MGUS, combined diastolic and systolic congestive heart failure (EF 45-50% with grade 2 diastolic dysfunction, atrial fibrillation on Eliquis, chronic kidney disease-stage III, who presents with chest pain.  Patient reports that she started having chest pain at about 3 PM. Her chest pain is located over her lower chest, moving from the right to the left per patient. It is moderate, lasted for about 1 hour. She denies shortness of breath, cough, fever or chills. No recent long distance traveling. No tenderness over calf areas. She was given 324 mg aspirin by EMS. Her chest pain has gradually subsided. She did not have chest pain when I saw her in ED. She has a little generalized lightheadedness and headache.   Currently patient denies fever, chills, running nose, ear pain, cough, SOB, abdominal pain, diarrhea, constipation, dysuria, urgency, frequency, hematuria, skin rashes or leg swelling. No unilateral weakness, numbness or tingling sensations. No vision change or hearing loss.  In ED, patient was found to have negative troponin, stable renal function, normal temperature, no tachycardia, no leukocytosis. Chest x-ray showed chronic changes without new issues. EKG showed paced rhythm, right axis deviation, left bundle blockage, QTc interval 547 and  poor R-wave progression, all of these are similar to previous EKG on 03/17/15. Patient is admitted to inpatient for further evaluation and treatment. Cardiology was consulted by ED, will see in morning.  Where does patient live?      At home   Can patient participate in ADLs? Barely    Review of Systems:   General: no fevers, chills, no changes in body weight, normal appetite, has fatigue HEENT: no blurry vision, hearing changes or sore throat Pulm: no dyspnea, coughing, wheezing CV: had chest pain, No palpitations, shortness of breath Abd: no nausea, vomiting, abdominal pain, diarrhea, constipation GU: no dysuria, hematuria, polyuria Ext: no leg edema Neuro: no unilateral weakness, numbness, or tingling Skin: no rash MSK: No muscle spasm, no deformity, no limitation of range of movement in spin Heme: No easy bruising.  Travel history: No recent long distant travel.  Allergy:  Allergies  Allergen Reactions  . Tramadol Nausea Only    Past Medical History  Diagnosis Date  . Dyslipidemia   . Hypertension   . Palpitations     PVCs/bigeminy on event in May 2009 revealing this was relatively asymptomatic  . Chest pain     a. 2011 Neg MV;  b. 01/2014 Cath: LM 20-30, LAD nl, D1 nl, LCX nl, OM1 nl, RCA dom 30-9m, PD/PL nl, EF 65%->Med Rx.  . Degenerative joint disease   . Hx of varicella   . History of skin cancer     tafeen/ non melanoma.   . Thrombocytopenia   . Anemia   . Monoclonal gammopathies   . Right lower quadrant abdominal pain 07/19/2012    Recurrent  With nausea    This is new since she had ct for llq pain in [facility]    R/o appendiceal problem  Hernia  Get ct scan  And plan  Fu     . Diastolic CHF, acute on chronic 01/31/2012  . Atrial fibrillation     a. Dx 01/2014->Eliquis started.  . Cancer   .  Pacemaker   . Dysrhythmia   . Family history of adverse reaction to anesthesia     " multiple family members have difficulty waking "  . GERD (gastroesophageal reflux disease)   . Hypothyroidism     Past Surgical History  Procedure Laterality Date  . Cholecystectomy  1999  . Cesarean section      times 2  . Shoulder surgery  1996    Right   . Cataract extraction      Bilateral  implantt  . Cardioversion N/A 02/26/2014    Procedure: CARDIOVERSION  AT BEDSIDE;  Surgeon: Pixie Casino, MD;  Location: [facility];  Service: Cardiovascular;  Laterality: N/A;  . Cardioversion N/A 04/22/2014    Procedure: CARDIOVERSION (BEDSIDE);  Surgeon: [name], MD;  Location: [facility] OR;  Service: Cardiovascular;  Laterality: N/A;  . Pacemaker insertion  04/23/2014    STJ Assurity dual chamber pacemaker implanted by Dr Rayann Heman  . Insert / replace / remove pacemaker    . Esophagogastroduodenoscopy N/A 07/06/2014    Procedure: ESOPHAGOGASTRODUODENOSCOPY (EGD);  Surgeon: Ladene Artist, MD;  Location: [facility] ENDOSCOPY;  Service: Endoscopy;  Laterality: N/A;  . Left heart catheterization with coronary angiogram N/A 01/29/2014    Procedure: LEFT HEART CATHETERIZATION WITH CORONARY ANGIOGRAM;  Surgeon: Blane Ohara, MD;  Location: [facility] CATH LAB;  Service: Cardiovascular;  Laterality: N/A;  . Permanent pacemaker insertion N/A 04/23/2014    Procedure: PERMANENT PACEMAKER INSERTION;  Surgeon: Evans Lance, MD;  Location: [facility] CATH LAB;  Service: Cardiovascular;  Laterality: N/A;  . Right heart catheterization N/A 09/22/2014    Procedure: RIGHT HEART CATH;  Surgeon: Jolaine Artist, MD;  Location: [facility] CATH LAB;  Service: Cardiovascular;  Laterality: N/A;    Social History:  reports that she has never smoked. She has never used smokeless tobacco. She reports that she does not drink alcohol or use illicit drugs.  Family History:  Family History  Problem Relation Age of Onset  . Coronary artery disease Father   . Heart disease Father   . Lung cancer    . Alzheimer's disease Mother   . Cancer Brother 53    lung cancer  . Heart attack Father      Prior to Admission medications   Medication Sig Start Date End Date Taking? Authorizing Provider  acetaminophen (TYLENOL) 325 MG tablet Take 2 tablets (650 mg total) by mouth every 4 (four) hours as needed for headache or mild pain. 09/26/14   Isaiah Serge, NP  amiodarone (PACERONE) 200 MG tablet Take 1 tablet (200 mg  total) by mouth daily. 11/06/14   Jolaine Artist, MD  apixaban (ELIQUIS) 2.5 MG TABS tablet Take 1 tablet (2.5 mg total) by mouth 2 (two) times daily. 12/30/14   Jolaine Artist, MD  ferrous sulfate 325 (65 FE) MG tablet Take 1 tablet (325 mg total) by mouth 2 (two) times daily with a meal. 11/21/14   Burnis Medin, MD  levothyroxine (SYNTHROID, LEVOTHROID) 75 MCG tablet Take 1 tablet (75 mcg total) by mouth daily. 11/21/14   Burnis Medin, MD  loratadine (CLARITIN) 10 MG tablet Take 10 mg by mouth daily as needed for allergies.     Historical Provider, MD  metolazone (ZAROXOLYN) 2.5 MG tablet Take 1 tablet (2.5 mg total) by mouth as needed (for weight 136 lb or greater). 12/16/14   Jolaine Artist, MD  nitroGLYCERIN (NITROSTAT) 0.4 MG SL tablet Place 1 tablet (0.4 mg total) under the  tongue every 5 (five) minutes as needed. For chest pain 12/02/13   Fay Records, MD  pantoprazole (PROTONIX) 40 MG tablet Take 1 tablet (40 mg total) by mouth daily. 08/11/14   Burnis Medin, MD  potassium chloride SA (KLOR-CON M20) 20 MEQ tablet Take 3 tablets (60 mEq total) by mouth daily. 03/26/15   Jolaine Artist, MD  senna (SENOKOT) 8.6 MG TABS tablet Take 2 tablets (17.2 mg total) by mouth daily as needed for mild constipation. 09/26/14   Isaiah Serge, NP  simvastatin (ZOCOR) 10 MG tablet Take 1 tablet (10 mg total) by mouth at bedtime. 11/06/14   Jolaine Artist, MD  torsemide (DEMADEX) 20 MG tablet Take 2 tablets (40 mg total) by mouth daily. 02/14/15   Almyra Deforest, PA    Physical Exam: Filed Vitals:   03/30/15 2145 03/30/15 2200 03/30/15 2300 03/30/15 2315  BP: 107/52 121/52 114/55 106/64  Pulse: 69 71 68 70  Temp:      TempSrc:      Resp: 14 17 17 12   SpO2: 98% 96% 96% 97%   General: Not in acute distress HEENT:       Eyes: PERRL, EOMI, no scleral icterus       ENT: No discharge from the ears and nose, no pharynx injection, no tonsillar enlargement.        Neck: No JVD, no bruit, no mass  felt. Heme: No neck or axillary lymph node enlargement Cardiac: S1/S2, RRR, No murmurs, No gallops or rubs. Has pace maker over right upper chest wall. Pulm: Good air movement bilaterally. Clear to auscultation bilaterally. No rales, wheezing, rhonchi or rubs. Abd: Soft, nondistended, nontender, no rebound pain, no organomegaly, BS present Ext: No edema bilaterally. 2+DP/PT pulse bilaterally Musculoskeletal: No joint deformities, erythema, or stiffness, ROM full Skin: No rashes.  Neuro: Alert, oriented X3, cranial nerves II-XII grossly intact, muscle strength 5/5 in all extremities, sensation to light touch intact.  Psych: Patient is not psychotic, no suicidal or hemocidal ideation.  Labs on Admission:  Basic Metabolic Panel:  Recent Labs Lab 03/30/15 2018  NA 142  K 4.2  CL 105  CO2 27  GLUCOSE 96  BUN 34*  CREATININE 1.77*  CALCIUM 9.2   Liver Function Tests: No results for input(s): AST, ALT, ALKPHOS, BILITOT, PROT, ALBUMIN in the last 168 hours. No results for input(s): LIPASE, AMYLASE in the last 168 hours. No results for input(s): AMMONIA in the last 168 hours. CBC:  Recent Labs Lab 03/30/15 2018  WBC 4.5  NEUTROABS 2.9  HGB 11.9*  HCT 37.1  MCV 97.9  PLT 120*   Cardiac Enzymes: No results for input(s): CKTOTAL, CKMB, CKMBINDEX, TROPONINI in the last 168 hours.  BNP (last 3 results)  Recent Labs  12/26/14 0554 02/10/15 1941 02/13/15 0530  BNP 282.1* 535.6* 377.9*    ProBNP (last 3 results)  Recent Labs  08/27/14 1147 09/18/14 1317 10/25/14 0955  PROBNP 914.0* 1874.0* 13191.0*    CBG: No results for input(s): GLUCAP in the last 168 hours.  Radiological Exams on Admission: Dg Chest 2 View  03/30/2015   CLINICAL DATA:  Chest pain for 2 hours.  EXAM: CHEST  2 VIEW  COMPARISON:  03/17/2015  FINDINGS: Dual lead left-sided pacemaker remains in place. Borderline mild cardiomegaly. No pulmonary edema, confluent airspace disease, or pleural effusion.  No pneumothorax. Increased bronchovascular markings are chronic and unchanged from prior exam. The bones are under mineralized.  IMPRESSION: Stable chronic findings without  superimposed acute process.   Electronically Signed   By: Jeb Levering M.D.   On: 03/30/2015 21:15    EKG: Independently reviewed.  Abnormal findings: paced rhythm, right axis deviation, left bundle blockage, QTc interval 547 and  poor R-wave progression, all of these are similar to previous EKG on 03/17/15. Patient.   Assessment/Plan Principal Problem:   Recurrent chest pain Active Problems:   Hyperlipidemia   History of skin cancer   Hypothyroidism   Thrombocytopenia   MGUS (monoclonal gammopathy of unknown significance)   Atrial fibrillation   Hypertension   Chronic anticoagulation   Pacemaker   CKD (chronic kidney disease) stage 3, GFR 30-59 ml/min  Recurrent chest pain: patient has recurrent atypical chest pain. Per Dr. Clayborne Dana note on 03/26/15, will need stress test if CP recurs. Currently her chest pain has resolved. Her 2d ehco on 03/18/15 showed mild septal HK. Card was consulted by ED.   -will admit to tele bed -trop x 3 - prn NG and morphin -pt is on Eliquis, will not start ASA -continue zocor -will not start BB given soft bp and resolved chest pain -will not repeat 2D echo since she just had one on 03/18/15. -follow up card Recs  Chronic Diastolic Heart Failure: 2-D echo on 03/18/15 showed EF 45-50% with grade 2 diastolic dysfunction. Per Dr. Clayborne Dana note on 03/26/15, pt has a very narrow euvolemic window. Today her volume status is OK, no any leg edema.  -continue torsemide 40 mg once a day. -check body weight daily, will start metolazone only for weight 135 or greater per dr. Haroldine Laws -check BNP -NPO now, will need fluid restriction when ready to eat  Atrial Fibrillation: s/p PPM.CHA2DS2-VASc Score is 5, needs oral anticoagulation. Patient is on Eliquis at home. No bleeding problem. Heart  rate is well controlled. -Continue amiodarone 200 mg daily.  -Continue Eliquis 2.5 mg twice a day.   CKD stage III: Baseline creatinine 1.8-2.2. Her creatinine is 1.77, which is at the baseline. -Follow-up renal function by BMP  Hyperlipidemia: LDL was 41 on 04/14/14. -Continue Zocor  GERD: - Protonix  Hypothyroidism: TSH was 3.588 on 02/11/15: -Continue Synthroid  Thrombocytopenia: Stable. Platelets 120. No bleeding tendency. -Follow-up by CBC   DVT ppx: On eliquis Code Status: Full code Family Communication: None at bed side.  Disposition Plan: Admit to inpatient   Date of Service 03/31/2015    Ivor Costa [facility] Pager [phone]  If 7PM-7AM, please contact night-coverage [email or web address] Password [email or web address] 03/31/2015, 12:19 AM

## 2015-03-31 NOTE — Care Management Note (Addendum)
Case Management Note  Patient Details  Name: [name] MRN: [id number] Date of Birth: 1925/07/20  Subjective/Objective:       Pt is admitted for CP. Pt is from home alone. Pt was previously active with [facility] for RN, PT/OT services. Pt will need resumption orders for services if this continues to be the plan.   Action/Plan: Physical Therapy to consult with pt for disposition needs. CM will continue to monitor.    Expected Discharge Date:  04/02/15               Expected Discharge Plan:  [place]  In-House Referral:     Discharge planning Services  CM Consult  Post Acute Care Choice:  Home Health, Resumption of Svcs/PTA Provider Choice offered to:  Patient  DME Arranged:    DME Agency:     HH Arranged:  RN, PT, OT HH Agency:  [facility] (Pt is active with [place]. )  Status of Service:  Completed, signed off  Medicare Important Message Given:    Date Medicare IM Given:    Medicare IM give by:    Date Additional Medicare IM Given:    Additional Medicare Important Message give by:     If discussed at [place] of Stay Meetings, dates discussed:    Additional Comments:  Bethena Roys, RN 03/31/2015, 12:34 PM

## 2015-03-31 NOTE — Consult Note (Signed)
Reason for Consult: chest pain Primary Cardiologist: Dr. Haroldine Laws Referring Physician: Dr. Prescilla Sours is an 81 y.o. female.  HPI: [name] is an 81 yo woman with PMH of nonobsructive CAD on LHC 3/15, persistent atrial fibrillation, diastolic heart failure, hypertension, dyslipidemia, anemia, stage IV CKD with multiple admissions over the last year last admitted in May of 2016 with dry weight around 133 lbs and narrow volume window followed closely by Dr. Haroldine Laws. She was seen 03/26/15 by Dr. Vaughan Browner and her HF was focused on fluid restriction, daily weights, and taking metolazone for weight greater than 135. She has known chest pain and she had an echo with mild septal hypokinesis with consideration given to follow-up stress if further CP occurs. Tonight she had sudden chest pain and dizziness that developed at rest yesterday afternoon with central to substernal location and burning/pressure sensation. She received aspirin but no SL NTG because it gives her hypotension. She had negative troponin and stable ECG in the ER.   Past Medical History  Diagnosis Date  . Dyslipidemia   . Hypertension   . Palpitations     PVCs/bigeminy on event in May 2009 revealing this was relatively asymptomatic  . Chest pain     a. 2011 Neg MV;  b. 01/2014 Cath: LM 20-30, LAD nl, D1 nl, LCX nl, OM1 nl, RCA dom 30-9m PD/PL nl, EF 65%->Med Rx.  . Degenerative joint disease   . Hx of varicella   . History of skin cancer     tafeen/ non melanoma.   . Thrombocytopenia   . Anemia   . Monoclonal gammopathies   . Right lower quadrant abdominal pain 07/19/2012    Recurrent  With nausea    This is new since she had ct for llq pain in [facility]    R/o appendiceal problem  Hernia  Get ct scan  And plan  Fu     . Diastolic CHF, acute on chronic 01/31/2012  . Atrial fibrillation     a. Dx 01/2014->Eliquis started.  . Cancer   . Pacemaker   . Dysrhythmia   . Family history of adverse reaction to  anesthesia     " multiple family members have difficulty waking "  . GERD (gastroesophageal reflux disease)   . Hypothyroidism     Past Surgical History  Procedure Laterality Date  . Cholecystectomy  1999  . Cesarean section      times 2  . Shoulder surgery  1996    Right   . Cataract extraction      Bilateral  implantt  . Cardioversion N/A 02/26/2014    Procedure: CARDIOVERSION AT BEDSIDE;  Surgeon: KPixie Casino MD;  Location: [facility]  Service: Cardiovascular;  Laterality: N/A;  . Cardioversion N/A 04/22/2014    Procedure: CARDIOVERSION (BEDSIDE);  Surgeon: TSueanne Margarita MD;  [facility];  Service: Cardiovascular;  Laterality: N/A;  . Pacemaker insertion  04/23/2014    STJ Assurity dual chamber pacemaker implanted by Dr ARayann Heman . Insert / replace / remove pacemaker    . Esophagogastroduodenoscopy N/A 07/06/2014    Procedure: ESOPHAGOGASTRODUODENOSCOPY (EGD);  Surgeon: MLadene Artist MD;  [facility];  Service: Endoscopy;  Laterality: N/A;  . Left heart catheterization with coronary angiogram N/A 01/29/2014    Procedure: LEFT HEART CATHETERIZATION WITH CORONARY ANGIOGRAM;  Surgeon: MBlane Ohara MD;  [facility];  Service: Cardiovascular;  Laterality: N/A;  . Permanent pacemaker insertion N/A 04/23/2014  Procedure: PERMANENT PACEMAKER INSERTION;  Surgeon: Evans Lance, MD;  Location: [facility];  Service: Cardiovascular;  Laterality: N/A;  . Right heart catheterization N/A 09/22/2014    Procedure: RIGHT HEART CATH;  Surgeon: Jolaine Artist, MD;  [facility] C[facility] LAB;  Service: Cardiovascular;  Laterality: N/A;    Family History  Problem Relation Age of Onset  . Coronary artery disease Father   . Heart disease Father   . Lung cancer    . Alzheimer's disease Mother   . Cancer Brother 39    lung cancer  . Heart attack Father     Social History:  reports that she has never smoked. She has never used smokeless tobacco. She reports that she  does not drink alcohol or use illicit drugs.  Allergies:  Allergies  Allergen Reactions  . Tramadol Nausea Only    Medications:  I have reviewed the patient's current medications. Prior to Admission:  Prescriptions prior to admission  Medication Sig Dispense Refill Last Dose  . acetaminophen (TYLENOL) 325 MG tablet Take 2 tablets (650 mg total) by mouth every 4 (four) hours as needed for headache or mild pain.   Taking  . amiodarone (PACERONE) 200 MG tablet Take 1 tablet (200 mg total) by mouth daily. 30 tablet 6 Taking  . apixaban (ELIQUIS) 2.5 MG TABS tablet Take 1 tablet (2.5 mg total) by mouth 2 (two) times daily. 60 tablet 6 Taking  . ferrous sulfate 325 (65 FE) MG tablet Take 1 tablet (325 mg total) by mouth 2 (two) times daily with a meal. 180 tablet 1 Taking  . levothyroxine (SYNTHROID, LEVOTHROID) 75 MCG tablet Take 1 tablet (75 mcg total) by mouth daily. 90 tablet 1 Taking  . loratadine (CLARITIN) 10 MG tablet Take 10 mg by mouth daily as needed for allergies.    Taking  . metolazone (ZAROXOLYN) 2.5 MG tablet Take 1 tablet (2.5 mg total) by mouth as needed (for weight 136 lb or greater). 10 tablet 3 Taking  . nitroGLYCERIN (NITROSTAT) 0.4 MG SL tablet Place 1 tablet (0.4 mg total) under the tongue every 5 (five) minutes as needed. For chest pain 25 tablet 6 Taking  . pantoprazole (PROTONIX) 40 MG tablet Take 1 tablet (40 mg total) by mouth daily. 30 tablet 5 Taking  . potassium chloride SA (KLOR-CON M20) 20 MEQ tablet Take 3 tablets (60 mEq total) by mouth daily. 90 tablet 6   . senna (SENOKOT) 8.6 MG TABS tablet Take 2 tablets (17.2 mg total) by mouth daily as needed for mild constipation. 30 tablet 6 Taking  . simvastatin (ZOCOR) 10 MG tablet Take 1 tablet (10 mg total) by mouth at bedtime. 30 tablet 6 Taking  . torsemide (DEMADEX) 20 MG tablet Take 2 tablets (40 mg total) by mouth daily.   Taking   Scheduled: . sodium chloride   Intravenous STAT  . amiodarone  200 mg Oral  Daily  . apixaban  2.5 mg Oral BID  . ferrous sulfate  325 mg Oral BID WC  . levothyroxine  75 mcg Oral QAC breakfast  . pantoprazole  40 mg Oral Daily  . simvastatin  10 mg Oral QHS  . sodium chloride  3 mL Intravenous Q12H  . torsemide  40 mg Oral Daily    Results for orders placed or performed during the hospital encounter of 03/30/15 (from the past 48 hour(s))  Basic metabolic panel     Status: Abnormal   Collection Time: 03/30/15  8:18 PM  Result Value Ref Range   Sodium 142 135 - 145 mmol/L   Potassium 4.2 3.5 - 5.1 mmol/L   Chloride 105 101 - 111 mmol/L   CO2 27 22 - 32 mmol/L   Glucose, Bld 96 65 - 99 mg/dL   BUN 34 (H) 6 - 20 mg/dL   Creatinine, Ser 1.77 (H) 0.44 - 1.00 mg/dL   Calcium 9.2 8.9 - 10.3 mg/dL   GFR calc non Af Amer 24 (L) >60 mL/min   GFR calc Af Amer 28 (L) >60 mL/min    Comment: (NOTE) The eGFR has been calculated using the CKD EPI equation. This calculation has not been validated in all clinical situations. eGFR's persistently <60 mL/min signify possible Chronic Kidney Disease.    Anion gap 10 5 - 15  CBC with Differential     Status: Abnormal   Collection Time: 03/30/15  8:18 PM  Result Value Ref Range   WBC 4.5 4.0 - 10.5 K/uL   RBC 3.79 (L) 3.87 - 5.11 MIL/uL   Hemoglobin 11.9 (L) 12.0 - 15.0 g/dL   HCT 37.1 36.0 - 46.0 %   MCV 97.9 78.0 - 100.0 fL   MCH 31.4 26.0 - 34.0 pg   MCHC 32.1 30.0 - 36.0 g/dL   RDW 13.3 11.5 - 15.5 %   Platelets 120 (L) 150 - 400 K/uL   Neutrophils Relative % 65 43 - 77 %   Neutro Abs 2.9 1.7 - 7.7 K/uL   Lymphocytes Relative 21 12 - 46 %   Lymphs Abs 0.9 0.7 - 4.0 K/uL   Monocytes Relative 11 3 - 12 %   Monocytes Absolute 0.5 0.1 - 1.0 K/uL   Eosinophils Relative 2 0 - 5 %   Eosinophils Absolute 0.1 0.0 - 0.7 K/uL   Basophils Relative 1 0 - 1 %   Basophils Absolute 0.0 0.0 - 0.1 K/uL  I-stat troponin, ED     Status: None   Collection Time: 03/30/15  8:22 PM  Result Value Ref Range   Troponin i, poc 0.04  0.00 - 0.08 ng/mL   Comment 3            Comment: Due to the release kinetics of cTnI, a negative result within the first hours of the onset of symptoms does not rule out myocardial infarction with certainty. If myocardial infarction is still suspected, repeat the test at appropriate intervals.   Brain natriuretic peptide     Status: Abnormal   Collection Time: 03/31/15  1:42 AM  Result Value Ref Range   B Natriuretic Peptide 442.4 (H) 0.0 - 100.0 pg/mL  Troponin I (q 6hr x 3)     Status: Abnormal   Collection Time: 03/31/15  1:42 AM  Result Value Ref Range   Troponin I 0.05 (H) <0.031 ng/mL    Comment:        PERSISTENTLY INCREASED TROPONIN VALUES IN THE RANGE OF 0.04-0.49 ng/mL CAN BE SEEN IN:       -UNSTABLE ANGINA       -CONGESTIVE HEART FAILURE       -MYOCARDITIS       -CHEST TRAUMA       -ARRYHTHMIAS       -LATE PRESENTING MYOCARDIAL INFARCTION       -COPD   CLINICAL FOLLOW-UP RECOMMENDED.   Protime-INR     Status: Abnormal   Collection Time: 03/31/15  1:42 AM  Result Value Ref Range   Prothrombin Time 16.2 (H) 11.6 - 15.2  seconds   INR 1.28 0.00 - 1.49    Dg Chest 2 View  03/30/2015   CLINICAL DATA:  Chest pain for 2 hours.  EXAM: CHEST  2 VIEW  COMPARISON:  03/17/2015  FINDINGS: Dual lead left-sided pacemaker remains in place. Borderline mild cardiomegaly. No pulmonary edema, confluent airspace disease, or pleural effusion. No pneumothorax. Increased bronchovascular markings are chronic and unchanged from prior exam. The bones are under mineralized.  IMPRESSION: Stable chronic findings without superimposed acute process.   Electronically Signed   By: Jeb Levering M.D.   On: 03/30/2015 21:15    Review of Systems  Constitutional: Negative for fever, chills and weight loss.  HENT: Negative for ear pain.   Eyes: Negative for blurred vision, double vision and photophobia.  Respiratory: Negative for cough and shortness of breath.   Cardiovascular: Positive for  chest pain. Negative for palpitations, leg swelling and PND.  Gastrointestinal: Negative for heartburn, nausea and vomiting.  Genitourinary: Negative for dysuria, frequency and hematuria.  Musculoskeletal: Negative for myalgias and neck pain.  Skin: Negative for itching and rash.  Neurological: Negative for dizziness, tingling, seizures, loss of consciousness and headaches.  Endo/Heme/Allergies: Negative for polydipsia. Does not bruise/bleed easily.  Psychiatric/Behavioral: Negative for depression, suicidal ideas, hallucinations and substance abuse.   Blood pressure 129/52, pulse 76, temperature 98 F (36.7 C), temperature source Oral, resp. rate 16, height '4\' 11"'  (1.499 m), weight 63.3 kg (139 lb 8.8 oz), SpO2 100 %. Physical Exam  Nursing note and vitals reviewed. Constitutional: She is oriented to person, place, and time. She appears well-developed and well-nourished. No distress.  HENT:  Head: Normocephalic and atraumatic.  Nose: Nose normal.  Mouth/Throat: Oropharynx is clear and moist. No oropharyngeal exudate.  Eyes: Conjunctivae and EOM are normal. Pupils are equal, round, and reactive to light. No scleral icterus.  Neck: Normal range of motion. Neck supple. No JVD present. No tracheal deviation present.  jvp just above clavicle, slight HJR  Cardiovascular: Normal rate, regular rhythm and intact distal pulses.   Murmur heard. Respiratory: Effort normal and breath sounds normal. No respiratory distress. She has no wheezes.  GI: Soft. Bowel sounds are normal. She exhibits no distension. There is no tenderness. There is no rebound.  Musculoskeletal: Normal range of motion. She exhibits no edema or tenderness.  Neurological: She is alert and oriented to person, place, and time. No cranial nerve deficit. Coordination normal.  Skin: Skin is warm and dry. No rash noted. She is not diaphoretic. No erythema.  Psychiatric: She has a normal mood and affect. Her behavior is normal. Judgment  and thought content normal.   Labs reviewed Cr 1.77, na 142, K 4.2, bun/cr 34/1.77, h/h 11.9/37, wbc 4.5, plt 120 Chest x-ray no acute process stable ECG: SR, IVCD, previous anteroseptal infarct Echo 5/16 reviewed; EF 45-50%, grade II DD, mild MR, hypokinesis of the mid inferoseptal and apical septal myocardium.  Assessment/Plan: [name] is an 81 yo woman with PMH of nonobsructive CAD on LHC 3/15, persistent atrial fibrillation, diastolic heart failure, hypertension, dyslipidemia, anemia, stage IV CKD with multiple admissions over the last year last admitted in May of 2016 with dry weight around 133 lbs and narrow volume window followed closely by Dr. Haroldine Laws. Differential diagnosis is musculoskeletal pain, esophageal spasm, GERD, pericarditis, ACS/NSTEMI among other etiologies. She has negative cardiac biomarkers but given recurrent chest pain, I favor stress testing today with nuclear perfusion study.CHADS2vASC is 5 on anticoagulation with apixaban.  Problem List Chest pain Diastolic heart  failure Persistent atrial fibrillation CKD stage IV Dyslipidemia Thrombocytopenia Plan: - NPO for stress testing - would pursue stress testing likely nuclear perfusion study - trend cardiac markers - observation on telemetry - asa 81 mg, eliquis for atrial fibrillation - continue torsemide 40 mg and home diuretic strategy    Darina Hartwell 03/31/2015, 5:44 AM

## 2015-04-01 ENCOUNTER — Telehealth (HOSPITAL_COMMUNITY): Payer: Self-pay

## 2015-04-01 DIAGNOSIS — Z7901 Long term (current) use of anticoagulants: Secondary | ICD-10-CM

## 2015-04-01 DIAGNOSIS — R0782 Intercostal pain: Secondary | ICD-10-CM

## 2015-04-01 DIAGNOSIS — I10 Essential (primary) hypertension: Secondary | ICD-10-CM | POA: Diagnosis not present

## 2015-04-01 DIAGNOSIS — E038 Other specified hypothyroidism: Secondary | ICD-10-CM

## 2015-04-01 DIAGNOSIS — R079 Chest pain, unspecified: Secondary | ICD-10-CM

## 2015-04-01 DIAGNOSIS — I4891 Unspecified atrial fibrillation: Secondary | ICD-10-CM | POA: Diagnosis not present

## 2015-04-01 DIAGNOSIS — E785 Hyperlipidemia, unspecified: Secondary | ICD-10-CM | POA: Diagnosis not present

## 2015-04-01 DIAGNOSIS — I480 Paroxysmal atrial fibrillation: Secondary | ICD-10-CM | POA: Diagnosis not present

## 2015-04-01 DIAGNOSIS — G8929 Other chronic pain: Secondary | ICD-10-CM

## 2015-04-01 LAB — BASIC METABOLIC PANEL
ANION GAP: 8 (ref 5–15)
BUN: 27 mg/dL — ABNORMAL HIGH (ref 6–20)
CALCIUM: 9.2 mg/dL (ref 8.9–10.3)
CO2: 25 mmol/L (ref 22–32)
Chloride: 106 mmol/L (ref 101–111)
Creatinine, Ser: 1.78 mg/dL — ABNORMAL HIGH (ref 0.44–1.00)
GFR calc non Af Amer: 24 mL/min — ABNORMAL LOW (ref >60)
GFR, EST AFRICAN AMERICAN: 28 mL/min — AB (ref >60)
GLUCOSE: 115 mg/dL — AB (ref 65–99)
Potassium: 4.2 mmol/L (ref 3.5–5.1)
SODIUM: 139 mmol/L (ref 135–145)

## 2015-04-01 LAB — GLUCOSE, CAPILLARY: Glucose-Capillary: 97 mg/dL (ref 65–99)

## 2015-04-01 MED ORDER — POTASSIUM CHLORIDE CRYS ER 20 MEQ PO TBCR: 60.0000 meq | EXTENDED_RELEASE_TABLET | ORAL | Status: DC

## 2015-04-01 MED ORDER — AMIODARONE HCL 200 MG PO TABS: 200.0000 mg | ORAL_TABLET | Freq: Every day | ORAL | Status: DC

## 2015-04-01 MED ORDER — LORAZEPAM 2 MG/ML IJ SOLN: 1.0000 mg | Freq: Once | INTRAMUSCULAR | Status: DC

## 2015-04-01 MED ORDER — TORSEMIDE 20 MG PO TABS: 40.0000 mg | ORAL_TABLET | ORAL | Status: DC

## 2015-04-01 MED ORDER — POTASSIUM CHLORIDE CRYS ER 20 MEQ PO TBCR
60.0000 meq | EXTENDED_RELEASE_TABLET | Freq: Every day | ORAL | Status: DC
  Administered 2015-04-01: 60 meq via ORAL
  Filled 2015-04-01: qty 3

## 2015-04-01 MED ORDER — METOLAZONE 2.5 MG PO TABS
1.2500 mg | ORAL_TABLET | Freq: Once | ORAL | Status: AC
  Administered 2015-04-01: 1.25 mg via ORAL
  Filled 2015-04-01: qty 0.5

## 2015-04-01 NOTE — Care Management Note (Signed)
Case Management Note  Patient Details  Name: [name] MRN: [id number] Date of Birth: Apr 23, 1925  Subjective/Objective:  Pt plan for d/c home today with [facility] services. [facility] and SOC to begin within 24-48 hours post d/c.                   Action/Plan: No further needs at this time.   Expected Discharge Date:  04/02/15               Expected Discharge Plan:  [place]  In-House Referral:     Discharge planning Services  CM Consult  Post Acute Care Choice:  Home Health, Resumption of Svcs/PTA Provider Choice offered to:  Patient  DME Arranged:    DME Agency:     HH Arranged:  RN, PT, OT, Nurse's Aide [place] Agency:  [facility] (Pt is active with [place]. )  Status of Service:  Completed, signed off  Medicare Important Message Given:  No Date Medicare IM Given:    Medicare IM give by:    Date Additional Medicare IM Given:    Additional Medicare Important Message give by:     If discussed at [place] of Stay Meetings, dates discussed:    Additional Comments:  Bethena Roys, RN 04/01/2015, 1:52 PM

## 2015-04-01 NOTE — Progress Notes (Addendum)
Dr. Ellyn Hack wanted closer follow-up. I called CHF clinic - F/u scheduled in HF clinic with Dr. Haroldine Laws 5/25 at 3pm. Since the patient has already been discharged, Reeves Forth will call the patient to notify her of the appt. Dayna Dunn PA-C

## 2015-04-01 NOTE — Progress Notes (Signed)
Patient: [name] / Admit Date: 03/30/2015 / Date of Encounter: 04/01/2015, 10:47 AM   Subjective: Currently feeling well. She endorsed some PND/chest tightness overnight but no obvious orthopnea. She reports feeling anxious at the time. She also has been under a lot of stress with family issues (something to do with daughter/life insurance policy). No chest pains or palpitations. She feels that she is functionally at her baseline.    Objective: Telemetry: Atrial paced   Physical Exam: Blood pressure 100/60, pulse 80, temperature 98.3 F (36.8 C), temperature source Oral, resp. rate 18, height 4\' 11"  (1.499 m), weight 140 lb (63.504 kg), SpO2 98 %. General: Elderly, well developed, well nourished thin WF, in no acute distress. Head: Normocephalic, atraumatic, sclera non-icteric, no xanthomas, nares are without discharge. Neck: Negative for carotid bruits. JVP not elevated at 45 degrees.  Lungs: Coarse crackles auscultated in the lung bases bilaterally. Breathing is unlabored. Heart: RRR S1 S2 and S3. No murmurs or rubs.   Abdomen: Soft, non-tender, non-distended with normoactive bowel sounds. No rebound/guarding. Extremities: No clubbing or cyanosis. No edema. Distal pedal pulses are 2+ and equal bilaterally. Neuro: Alert and oriented X 3. Moves all extremities spontaneously. Psych:  Responds to questions appropriately with a normal affect.   Intake/Output Summary (Last 24 hours) at 04/01/15 1047 Last data filed at 04/01/15 0829  Gross per 24 hour  Intake    480 ml  Output    750 ml  Net   -270 ml    Inpatient Medications:  . amiodarone  400 mg Oral BID  . apixaban  2.5 mg Oral BID  . ferrous sulfate  325 mg Oral BID WC  . levothyroxine  75 mcg Oral QAC breakfast  . LORazepam  1 mg Intravenous Once  . pantoprazole  40 mg Oral Daily  . simvastatin  10 mg Oral QHS  . sodium chloride  3 mL Intravenous Q12H  . torsemide  40 mg Oral Daily   Infusions:     Labs:  Recent Labs  03/30/15 2018 03/31/15 0753  NA 142 142  K 4.2 3.8  CL 105 107  CO2 27 27  GLUCOSE 96 98  BUN 34* 29*  CREATININE 1.77* 1.69*  CALCIUM 9.2 9.5    Recent Labs  03/31/15 0753  AST 20  ALT 18  ALKPHOS 87  BILITOT 0.7  PROT 5.9*  ALBUMIN 3.1*    Recent Labs  03/30/15 2018 03/31/15 0753  WBC 4.5 3.5*  NEUTROABS 2.9  --   HGB 11.9* 12.1  HCT 37.1 36.8  MCV 97.9 97.4  PLT 120* 113*    Recent Labs  03/31/15 0142 03/31/15 0753 03/31/15 1345  TROPONINI 0.05* 0.05* 0.05*   Invalid input(s): POCBNP No results for input(s): HGBA1C in the last 72 hours.   Radiology/Studies:  Dg Chest 2 View  03/30/2015   CLINICAL DATA:  Chest pain for 2 hours.  EXAM: CHEST  2 VIEW  COMPARISON:  03/17/2015  FINDINGS: Dual lead left-sided pacemaker remains in place. Borderline mild cardiomegaly. No pulmonary edema, confluent airspace disease, or pleural effusion. No pneumothorax. Increased bronchovascular markings are chronic and unchanged from prior exam. The bones are under mineralized.  IMPRESSION: Stable chronic findings without superimposed acute process.   Electronically Signed   By: Jeb Levering M.D.   On: 03/30/2015 21:15   Nm Myocar Multi W/spect W/wall Motion / Ef  03/31/2015    The study is normal.  This is a low risk study.  The  left ventricular ejection fraction is hyperdynamic (>65%).    Dg Chest Port 1 View  03/17/2015   CLINICAL DATA:  Chest pain, vomiting, hypertension  EXAM: PORTABLE CHEST - 1 VIEW  COMPARISON:  Radiograph 02/10/2015  FINDINGS: Left-sided pacemaker overlies normal cardiac silhouette. No effusion, infiltrate, or pneumothorax. Chronic bronchitic markings are noted.  IMPRESSION: Chronic bronchitic pulmonary markings.  No acute findings.   Electronically Signed   By: Suzy Bouchard M.D.   On: 03/17/2015 02:24     Assessment and Plan   1. Chest pain, somewhat atypical, with prior history of nonobstructive CAD - Myocardial  perfusion scan yesterday was normal. No clinical evidence of ischemia. Previous cardiac cath 3/15 showed nonobstructive CAD. Continue Zocor 10mg  daily. Not on aspirin due to concomitant Eliquis use. Chest pain resolved.  2. Acute on chronic diastolic heart failure. Her weight is up 1 lb today (140) over yesterday (139). Patient states her dry weight is 133. She takes metolazone at home PRN weight >136lbs. Intake and output is not being followed. - Mild crackles on exam and dyspnea last night. Note h/o narrow volume window. BP consistently on the softer side. Will give metolazone 1.25mg  once today per home med rec and resume home KCl dose. Continue Torsemide. - Hold off on BB. It appears Coreg was prescribed already on home med rec - she was not taking this prior to admission and this does not need to be added. Discharging prescriber should cancel this prescription. Hold off on ACEI/ARB also 2/2 hypotension.   3. Paroxysmal atrial fibrillation - she was previously thought to have persistent AF but more recently has had NSR on amiodarone. There was suggestion of elevated HR yesterday with OT, however, review of telemetry reveals this to be artifactual. HR actually 100-110 during that episode - atrial paced with ventricular sensing. HR probably appropriate for being out of bed. Device interrogation shows only 4 mode switches since last interrogation in March, last episode less than 1.5 hours in April. There was no AF/AFL at time of reported tachycardia. Will reduce amiodarone dose back to home value. Continue apixaban 2.5mg  BID.   4. HTN- BP consistently low-normal. No new antihypertensives (does not need Coreg RX at discharge).   5. Hypothyroidism- Continue levothyroxine 75 mcg daily. Can follow thyroid function as outpatient given amiodarone use.  6. CKD stage IIIb- diurese cautiously. Avoid nephrotoxic drugs. Ensure adequate renal perfusion, defer BB and ACEI.   She has followup 04/23/15 at 3pm in the  CHF clinic. Will d/w Dr. Ellyn Hack whether this will suffice for post-hosp f/u.  Signed, Melina Copa PA-C Pager: [phone]   I have seen, examined and evaluated the patient this AM along with Melina Copa, PA-C.  After reviewing all the available data and chart,  I agree with her findings, examination as well as impression recommendations.  Somewhat atypical chest pain with abnormal nuclear stress test yesterday. There is concern about possible PAF yesterday with RVR, however interrogation of her pacemaker indicated sinus tachycardia with ambulation but no atrial fibrillation. My suspicion is that her dizziness is probably more related to orthostatic hypotension related dizziness. With no evidence of PAF we will go back to her original amiodarone dosing. She's not on any antihypertensives. He takes 40 of torsemide at home daily with when necessary metolazone. She did have a little rales morning's we have given her dose of metolazone. However I think she may be a little dry side the most part at home and would therefore recommend potentially alternating her  Demadex dose obtained 20 and 40 mg daily.  She should have close follow-up with CHF clinic in June, but will be arranged to see a PA in ~1 wk to reassess symptoms   HARDING, Leonie Green, M.D., M.S. Interventional Cardiologist   Pager # [phone]

## 2015-04-01 NOTE — Progress Notes (Signed)
Pt sleeping when entering in the room. Pt woke up to take her synthroid and then went back to sleep. Will cont to monitor pt.

## 2015-04-01 NOTE — Telephone Encounter (Signed)
Patient was recently DC'd from the hospital, daughter called stating Rx for Carvedilol was called in.  However, was not on her discharge summary.  Per Rizwan's DC note, was accidentally populated to DC summary as it was listed as a previous home med in her chart.  But after further discussion with cards, was recognized that she had been off this for sometime, and he then discontinued it for the discharge summary.  Explained this to daughter and that it must have accidentally got signed and sent to her pharmacy electronically before the error was corrected.  Apologized and confirmed not to take it.  Daughter and aptient aware and appreciative.  [name]

## 2015-04-01 NOTE — Progress Notes (Signed)
Pt resting in bed and stated she felt SOB. Lung sounds clear bilaterally, 02 sat 99%ra. Pt also stated she had some chest heaviness and was feeling anxious. Np on call made aware. New order received. Will cont to monitor pt.

## 2015-04-01 NOTE — Progress Notes (Signed)
Physical Therapy Treatment Patient Details Name: [name] MRN: [id number] DOB: 1925/05/31 Today's Date: 04/01/2015    History of Present Illness 81 y.o. female with PMH of hypertension, hyperlipidemia, GERD, hypothyroidism, pacemaker placement, history of melanoma, history of MGUS, combined diastolic and systolic congestive heart failure (EF 45-50% with grade 2 diastolic dysfunction, atrial fibrillation on Eliquis, chronic kidney disease-stage III, who was admitted with chest pain.    PT Comments    Patient progressing with mobility and exercise this session.  Seemed more stable and less pain walking with walker.  States feels more secure as well.  Patient hopeful for d/c today.  Follow Up Recommendations  Home health PT;Supervision/Assistance - 24 hour     Equipment Recommendations  None recommended by PT    Recommendations for Other Services       Precautions / Restrictions Precautions Precautions: Fall    Mobility  Bed Mobility               General bed mobility comments: sitting on edge of bed  Transfers Overall transfer level: Modified independent Equipment used: Rolling walker (2 wheeled)                Ambulation/Gait Ambulation/Gait assistance: Supervision Ambulation Distance (Feet): 150 Feet Assistive device: Rolling walker (2 wheeled)       General Gait Details: patient reports improved pain walking with walker today and feels more steady   Stairs            Wheelchair Mobility    Modified Rankin (Stroke Patients Only)       Balance           Standing balance support: No upper extremity supported Standing balance-Leahy Scale: Fair Standing balance comment: standing at sink side stepping for balance no UE support                    Cognition Arousal/Alertness: Awake/alert Behavior During Therapy: WFL for tasks assessed/performed Overall Cognitive Status: Within Functional Limits for tasks assessed                       Exercises General Exercises - Lower Extremity Hip ABduction/ADduction: Strengthening;10 reps;Both;Standing Hip Flexion/Marching: Strengthening;Both;10 reps;Standing Heel Raises: Strengthening;Both;10 reps;Standing Mini-Sqauts: Strengthening;Both;10 reps;Standing Other Exercises Other Exercises: hamstring curls x 10 at sink with UE support Other Exercises: hip extension x 10 at sink with UE support    General Comments        Pertinent Vitals/Pain Faces Pain Scale: Hurts a little bit Pain Location: lower back Pain Intervention(s): Monitored during session    Home Living                      Prior Function            PT Goals (current goals can now be found in the care plan section) Progress towards PT goals: Progressing toward goals    Frequency  Min 3X/week    PT Plan Current plan remains appropriate    Co-evaluation             End of Session Equipment Utilized During Treatment: Gait belt Activity Tolerance: Patient tolerated treatment well Patient left: with call bell/phone within reach;in chair     Time: 4259-5638 PT Time Calculation (min) (ACUTE ONLY): 16 min  Charges:  $Gait Training: 8-22 mins                    G Codes:  WYNN,CYNDI 04/01/2015, 2:57 PM Magda Kiel, [place] 04/01/2015

## 2015-04-01 NOTE — Discharge Summary (Signed)
Physician Discharge Summary  [name] NTZ:[id number] DOB: 1925-07-01 DOA: 03/30/2015  PCP: [name], MD  Admit date: 03/30/2015 Discharge date: 04/01/2015  Time spent: 35 minutes   Discharge Condition: Stable Diet recommendation: Heart healthy, low-sodium  Discharge Diagnoses:  Principal Problem:   Recurrent chest pain Active Problems:   Hyperlipidemia   History of skin cancer   Hypothyroidism   Thrombocytopenia   MGUS (monoclonal gammopathy of unknown significance)   Atrial fibrillation   Hypertension   Chronic anticoagulation   Pacemaker   Chest pain   CKD (chronic kidney disease) stage 4    History of present illness:  [name] is a 81 y.o. female with PMH of hypertension, hyperlipidemia, GERD, hypothyroidism, pacemaker placement, history of melanoma, history of MGUS, combined diastolic and systolic congestive heart failure (EF 45-50% with grade 2 diastolic dysfunction, atrial fibrillation on Eliquis, chronic kidney disease-stage III, who presents with chest pain. Her pain was not associated with shortness of breath nausea diaphoresis or cough. According to the patient was present in the left lower chest. She was given an aspirin by EMS and the pain gradually subsided. By the time the admitting doctor saw her, she had no further chest pain. She did complain of some lightheadedness and a generalized headache. EKG did not reveal any abnormalities. Initial troponin were negative.  Hospital Course:  Chest pain -She was evaluated by cardiology- mildly elevated troponin noted-0.053 sets- - myocardial perfusion scan was performed and found to be normal. - Continue Zocor and Eliquis  Chronic diastolic heart failure -Has been evaluated by cardiology and after discussion with Dr. Ellyn Hack, he is recommending that we decrease her Demadex. He recommends a dose of 40 mg alternating with 20 mg every other day. The patient states that she was previously  receiving 40 mg twice a day but was decreased to once a day by the heart failure clinic. -I have Adjusted her potassium dose along with Demadex dosing to prevent hyperkalemia -Per med rec, the patient was taking Coreg at home but it appears that in fact she was not taking this-cardiology as recommended that we discontinue this medication completely from her med rec -She was taking metolazone at home when necessary for weight gain but cannot remember if she was taking a whole or half of a pill but she is asked to continue whatever dosage she was taking at home -Holding off on ARB/ ACE inhibitor due to hypotension -She has an appointment with the heart failure clinic in early June  Paroxysmal atrial fibrillation -Continue Eliquis and amiodarone  Hypothyroidism -Continue levothyroxine  Stage 4 CKD -Creatinine has been stable  Procedures:  Myoview stress test  Consultations:  Cardiology  Discharge Exam: Filed Weights   03/31/15 0129 03/31/15 0621 04/01/15 0540  Weight: 63.3 kg (139 lb 8.8 oz) 63.3 kg (139 lb 8.8 oz) 63.504 kg (140 lb)   Filed Vitals:   04/01/15 0550  BP: 100/60  Pulse:   Temp:   Resp:     General: AAO x 3, no distress Cardiovascular: RRR, no murmurs  Respiratory: clear to auscultation bilaterally GI: soft, non-tender, non-distended, bowel sound positive  Discharge Instructions You were cared for by a hospitalist during your hospital stay. If you have any questions about your discharge medications or the care you received while you were in the hospital after you are discharged, you can call the unit and asked to speak with the hospitalist on call if the hospitalist that took care of you is not available.  Once you are discharged, your primary care physician will handle any further medical issues. Please note that NO REFILLS for any discharge medications will be authorized once you are discharged, as it is imperative that you return to your primary care physician  (or establish a relationship with a primary care physician if you do not have one) for your aftercare needs so that they can reassess your need for medications and monitor your lab values.  Discharge Instructions    Diet - low sodium heart healthy    Complete by:  As directed      Increase activity slowly    Complete by:  As directed             Medication List    STOP taking these medications        erythromycin ophthalmic ointment     senna 8.6 MG Tabs tablet  Commonly known as:  SENOKOT      TAKE these medications        acetaminophen 325 MG tablet  Commonly known as:  TYLENOL  Take 2 tablets (650 mg total) by mouth every 4 (four) hours as needed for headache or mild pain.     amiodarone 200 MG tablet  Commonly known as:  PACERONE  Take 1 tablet (200 mg total) by mouth daily.     apixaban 2.5 MG Tabs tablet  Commonly known as:  ELIQUIS  Take 1 tablet (2.5 mg total) by mouth 2 (two) times daily.     calcium-vitamin D 250-125 MG-UNIT per tablet  Commonly known as:  OSCAL WITH D  Take 1 tablet by mouth daily.     ferrous sulfate 325 (65 FE) MG tablet  Take 1 tablet (325 mg total) by mouth 2 (two) times daily with a meal.     levothyroxine 75 MCG tablet  Commonly known as:  SYNTHROID, LEVOTHROID  Take 1 tablet (75 mcg total) by mouth daily.     loratadine 10 MG tablet  Commonly known as:  CLARITIN  Take 10 mg by mouth daily as needed for allergies.     metolazone 2.5 MG tablet  Commonly known as:  ZAROXOLYN  Take 1.25 mg by mouth as needed (for weight >136lbs.).     metolazone 2.5 MG tablet  Commonly known as:  ZAROXOLYN  Take 1 tablet (2.5 mg total) by mouth as needed (for weight 136 lb or greater).     nitroGLYCERIN 0.4 MG SL tablet  Commonly known as:  NITROSTAT  Place 1 tablet (0.4 mg total) under the tongue every 5 (five) minutes as needed. For chest pain     pantoprazole 40 MG tablet  Commonly known as:  PROTONIX  Take 1 tablet (40 mg total) by  mouth daily.     potassium chloride SA 20 MEQ tablet  Commonly known as:  K-DUR,KLOR-CON  Take 3 tablets (60 mEq total) by mouth every other day.     simvastatin 10 MG tablet  Commonly known as:  ZOCOR  Take 1 tablet (10 mg total) by mouth at bedtime.     torsemide 20 MG tablet  Commonly known as:  DEMADEX  Take 2 tablets (40 mg total) by mouth every other day.       Allergies  Allergen Reactions  . Tramadol Nausea Only      The results of significant diagnostics from this hospitalization (including imaging, microbiology, ancillary and laboratory) are listed below for reference.    Significant Diagnostic Studies: Dg Chest 2 View  03/30/2015   CLINICAL DATA:  Chest pain for 2 hours.  EXAM: CHEST  2 VIEW  COMPARISON:  03/17/2015  FINDINGS: Dual lead left-sided pacemaker remains in place. Borderline mild cardiomegaly. No pulmonary edema, confluent airspace disease, or pleural effusion. No pneumothorax. Increased bronchovascular markings are chronic and unchanged from prior exam. The bones are under mineralized.  IMPRESSION: Stable chronic findings without superimposed acute process.   Electronically Signed   By: Jeb Levering M.D.   On: 03/30/2015 21:15   Nm Myocar Multi W/spect W/wall Motion / Ef  03/31/2015    The study is normal.  This is a low risk study.  The left ventricular ejection fraction is hyperdynamic (>65%).    Dg Chest Port 1 View  03/17/2015   CLINICAL DATA:  Chest pain, vomiting, hypertension  EXAM: PORTABLE CHEST - 1 VIEW  COMPARISON:  Radiograph 02/10/2015  FINDINGS: Left-sided pacemaker overlies normal cardiac silhouette. No effusion, infiltrate, or pneumothorax. Chronic bronchitic markings are noted.  IMPRESSION: Chronic bronchitic pulmonary markings.  No acute findings.   Electronically Signed   By: Suzy Bouchard M.D.   On: 03/17/2015 02:24    Microbiology: No results found for this or any previous visit (from the past 240 hour(s)).   Labs: Basic  Metabolic Panel:  Recent Labs Lab 03/30/15 2018 03/31/15 0753 04/01/15 0925  NA 142 142 139  K 4.2 3.8 4.2  CL 105 107 106  CO2 27 27 25   GLUCOSE 96 98 115*  BUN 34* 29* 27*  CREATININE 1.77* 1.69* 1.78*  CALCIUM 9.2 9.5 9.2   Liver Function Tests:  Recent Labs Lab 03/31/15 0753  AST 20  ALT 18  ALKPHOS 87  BILITOT 0.7  PROT 5.9*  ALBUMIN 3.1*   No results for input(s): LIPASE, AMYLASE in the last 168 hours. No results for input(s): AMMONIA in the last 168 hours. CBC:  Recent Labs Lab 03/30/15 2018 03/31/15 0753  WBC 4.5 3.5*  NEUTROABS 2.9  --   HGB 11.9* 12.1  HCT 37.1 36.8  MCV 97.9 97.4  PLT 120* 113*   Cardiac Enzymes:  Recent Labs Lab 03/31/15 0142 03/31/15 0753 03/31/15 1345  TROPONINI 0.05* 0.05* 0.05*   BNP: BNP (last 3 results)  Recent Labs  02/10/15 1941 02/13/15 0530 03/31/15 0142  BNP 535.6* 377.9* 442.4*    ProBNP (last 3 results)  Recent Labs  08/27/14 1147 09/18/14 1317 10/25/14 0955  PROBNP 914.0* 1874.0* 13191.0*    CBG:  Recent Labs Lab 03/31/15 0733 04/01/15 0752  GLUCAP 90 97       SignedDebbe Odea, MD [facility] 04/01/2015, 1:15 PM

## 2015-04-03 ENCOUNTER — Encounter: Payer: Self-pay | Admitting: Cardiology

## 2015-04-04 NOTE — Progress Notes (Signed)
OT evaluation addendum    03/31/15 1014  OT Time Calculation  OT Start Time (ACUTE ONLY) 0935  OT Stop Time (ACUTE ONLY) 0949  OT Time Calculation (min) 14 min  OT G-codes **NOT FOR INPATIENT CLASS**  Functional Assessment Tool Used clinical judgment  Functional Limitation Self care  Self Care Current Status (C9449) CI  Self Care Goal Status (Q7591) Mellen  OT General Charges  $OT Visit 1 Procedure  OT Evaluation  $Initial OT Evaluation Tier I 1 Procedure   Roseanne Reno, OTR/L [phone]

## 2015-04-08 ENCOUNTER — Ambulatory Visit (HOSPITAL_COMMUNITY)
Admission: RE | Admit: 2015-04-08 | Discharge: 2015-04-08 | Disposition: A | Discharge status: Home / Self Care | Payer: Medicare Other | Source: Ambulatory Visit | Attending: Internal Medicine | Admitting: Internal Medicine

## 2015-04-08 VITALS — BP 118/68 | HR 70 | Wt 136.0 lb

## 2015-04-08 DIAGNOSIS — I959 Hypotension, unspecified: Secondary | ICD-10-CM | POA: Insufficient documentation

## 2015-04-08 DIAGNOSIS — I5033 Acute on chronic diastolic (congestive) heart failure: Secondary | ICD-10-CM

## 2015-04-08 DIAGNOSIS — I129 Hypertensive chronic kidney disease with stage 1 through stage 4 chronic kidney disease, or unspecified chronic kidney disease: Secondary | ICD-10-CM | POA: Insufficient documentation

## 2015-04-08 DIAGNOSIS — Z950 Presence of cardiac pacemaker: Secondary | ICD-10-CM | POA: Diagnosis not present

## 2015-04-08 DIAGNOSIS — I5032 Chronic diastolic (congestive) heart failure: Secondary | ICD-10-CM | POA: Diagnosis not present

## 2015-04-08 DIAGNOSIS — E876 Hypokalemia: Secondary | ICD-10-CM | POA: Insufficient documentation

## 2015-04-08 DIAGNOSIS — K219 Gastro-esophageal reflux disease without esophagitis: Secondary | ICD-10-CM | POA: Insufficient documentation

## 2015-04-08 DIAGNOSIS — R001 Bradycardia, unspecified: Secondary | ICD-10-CM | POA: Insufficient documentation

## 2015-04-08 DIAGNOSIS — I480 Paroxysmal atrial fibrillation: Secondary | ICD-10-CM | POA: Diagnosis not present

## 2015-04-08 DIAGNOSIS — E785 Hyperlipidemia, unspecified: Secondary | ICD-10-CM | POA: Diagnosis not present

## 2015-04-08 DIAGNOSIS — Z7901 Long term (current) use of anticoagulants: Secondary | ICD-10-CM | POA: Diagnosis not present

## 2015-04-08 DIAGNOSIS — E039 Hypothyroidism, unspecified: Secondary | ICD-10-CM | POA: Diagnosis not present

## 2015-04-08 DIAGNOSIS — N183 Chronic kidney disease, stage 3 (moderate): Secondary | ICD-10-CM | POA: Diagnosis not present

## 2015-04-08 DIAGNOSIS — Z79899 Other long term (current) drug therapy: Secondary | ICD-10-CM | POA: Diagnosis not present

## 2015-04-08 MED ORDER — POTASSIUM CHLORIDE CRYS ER 20 MEQ PO TBCR: 20.0000 meq | EXTENDED_RELEASE_TABLET | Freq: Three times a day (TID) | ORAL | Status: DC

## 2015-04-08 MED ORDER — TORSEMIDE 20 MG PO TABS: 40.0000 mg | ORAL_TABLET | Freq: Every day | ORAL | Status: DC

## 2015-04-08 NOTE — Addendum Note (Signed)
Encounter addended by: Micki Riley, RN on: 04/08/2015  4:07 PM<BR>     Documentation filed: Patient Instructions Section, Medications, Orders

## 2015-04-08 NOTE — Patient Instructions (Signed)
Please take Torsemide 40 mg daily Potassium 60 meq daily  Labs in 2 weeks by [place]

## 2015-04-08 NOTE — Progress Notes (Signed)
Patient ID: [name], female   DOB: 05-30-1925, 81 y.o.   MRN: [id number]  PCP: Dr Netty Starring Primary Cardiologist: Dr. Harrington Challenger  HPI: [name] is a 81 y.o. female with the history of nonobstructive CAD by cardiac catheterization in 01/2014, persistent atrial fibrillation, diastolic CHF, HTN, HL, anemia, and CKD. Patient was admitted 04/2014 x2 with acute on chronic diastolic CHF. She underwent cardioversion with restoration of NSR. This resulted in profound bradycardia and hypotension requiring pacemaker implantation. Pacemaker implant was complicated by acute blood loss anemia requiring transfusion with PRBCs  Admitted to [facility] August 21 with increased dyspnea. Diuresed with IV lasix and transitioned to po lasix 40 mg twice a day. GI evaluated due to anemia and black stool. Had EGD with no acute findings. Colonoscopy was cancelled per daughter due to multiple medical problems. She continued on eliquis 2.5 mg twice a day.  Palliative Care consulted and the patient and family request aggressive care for heart failure. Discharge weight was 152 pounds.   Admitted to [facility] 11/9 after RHC due to elevated filling pressures. Once diuresed she was put on torsemide 20 mg twice a day. She was also restarted on amiodarone 200 mg daily and continue on eliquis 2.5 mg twice a day. Discharge weight was 141 pounds.   Admitted 12/12 to 12/17 with recurrent HF and AF. Admit weight was 136. Was diuresed to 131. Diuresis limited by low BP. Demadex increased from 20 bid to 40 bid.   Admitted 12/06/14 - 12/09/14 for ADHF. Initial weight 134 pounds. Diuresed down to 130 and felt better. Treatment limited due to hypotension and worsening renal failure with creatinine up to 2.6. Renal u/s ok. Metolazone stopped.  Admitted in 3/16 with hypotension. Started on midodrine 2.5 bid. Demadex cut back from 40 bid to 40 daily.   She return for follow up: Here with daughter. Admitted again 5/17 - 5/18 with recurrent CP. Myoview  normal EF > 65% no ischemia. Weight up to 136 the other day and she took metolazone.   Studies:  - LHC (3/15): Mid LM 20-30%, mid RCA 30-40%, EF 65%  - Echo (4/15): Mild LVH, EF 60-65%, no RWMA, mild BAE, trivial eff  - Echo 5/15: EF 45-50% septl HK - Carotid US (5/15): 40-59% RICA stenosis. 4-03% LICA stenosis.  - 5/16 Myoview EF > 65% normal perfusion  RHC 09/22/14  RA = 11 RV = 49/6/10 PA = 49/22 (34) PCW = 18 (v = 25) Fick cardiac output/index = 6.4/4.0 PVR = 2.5 WU FA sat = 95% PA sat = 72%,74%  Labs 07/18/14 K 3.5 Creatinine 1.44 Hgb 10.7  Labs 07/14/14 K 4.1 Creatinine 1.46  Hgb 9.8  Labs 07/04/14 TSH 2.7 Labs 09/06/14: K 4.0, creatinine 1.93 Labs 09/15/14 K 4.6 Creatinine 3.9  Labs 09/26/14 K 3.6 Creatinine 1.61 hgb 13.7 Labs 10/01/14 K 4.4 Creatinine 1.7 Hgb 14/1  Labs 10/30/14 K 3.9 Creatinine 1.4  Labs 01/12/2015: K 4.2 Creatinine 2.22 Labs 03/23/2015: K 3.5 Creatinine 1.8  ROS: All systems negative except as listed in HPI, PMH and Problem List.  Past Medical History  Diagnosis Date  . Dyslipidemia   . Hypertension   . Palpitations     PVCs/bigeminy on event in May 2009 revealing this was relatively asymptomatic  . Chest pain     a. 2011 Neg MV;  b. 01/2014 Cath: LM 20-30, LAD nl, D1 nl, LCX nl, OM1 nl, RCA dom 30-10m, PD/PL nl, EF 65%->Med Rx.  . Degenerative joint disease   .  Hx of varicella   . History of skin cancer     tafeen/ non melanoma.   . Thrombocytopenia   . Anemia   . Monoclonal gammopathies   . Right lower quadrant abdominal pain 07/19/2012    Recurrent  With nausea    This is new since she had ct for llq pain in [facility]    R/o appendiceal problem  Hernia  Get ct scan  And plan  Fu     . Diastolic CHF, acute on chronic 01/31/2012  . Atrial fibrillation     a. Dx 01/2014->Eliquis started.  . Cancer   . Pacemaker   . Dysrhythmia   . Family history of adverse reaction to anesthesia     " multiple family members have difficulty waking "  . GERD  (gastroesophageal reflux disease)   . Hypothyroidism     Current Outpatient Prescriptions  Medication Sig Dispense Refill  . acetaminophen (TYLENOL) 325 MG tablet Take 2 tablets (650 mg total) by mouth every 4 (four) hours as needed for headache or mild pain.    Marland Kitchen amiodarone (PACERONE) 200 MG tablet Take 1 tablet (200 mg total) by mouth daily. 30 tablet 6  . apixaban (ELIQUIS) 2.5 MG TABS tablet Take 1 tablet (2.5 mg total) by mouth 2 (two) times daily. 60 tablet 6  . calcium-vitamin D (OSCAL WITH D) 250-125 MG-UNIT per tablet Take 1 tablet by mouth daily.    . ferrous sulfate 325 (65 FE) MG tablet Take 1 tablet (325 mg total) by mouth 2 (two) times daily with a meal. 180 tablet 1  . levothyroxine (SYNTHROID, LEVOTHROID) 75 MCG tablet Take 1 tablet (75 mcg total) by mouth daily. 90 tablet 1  . loratadine (CLARITIN) 10 MG tablet Take 10 mg by mouth daily as needed for allergies.     . metolazone (ZAROXOLYN) 2.5 MG tablet Take 1 tablet (2.5 mg total) by mouth as needed (for weight 136 lb or greater). 10 tablet 3  . nitroGLYCERIN (NITROSTAT) 0.4 MG SL tablet Place 1 tablet (0.4 mg total) under the tongue every 5 (five) minutes as needed. For chest pain 25 tablet 6  . pantoprazole (PROTONIX) 40 MG tablet Take 1 tablet (40 mg total) by mouth daily. 30 tablet 5  . potassium chloride SA (K-DUR,KLOR-CON) 20 MEQ tablet Take 3 tablets (60 mEq total) by mouth every other day.    . simvastatin (ZOCOR) 10 MG tablet Take 1 tablet (10 mg total) by mouth at bedtime. 30 tablet 6  . torsemide (DEMADEX) 20 MG tablet Take 2 tablets (40 mg total) by mouth every other day.     No current facility-administered medications for this encounter.      Filed Vitals:   04/08/15 1514  BP: 118/68  Pulse: 70  Weight: 136 lb (61.689 kg)  SpO2: 94%     PHYSICAL EXAM: General:  Elderly Frail appearing. No resp difficulty. Daughter present HEENT: normal except  Neck: supple. JVP  5-6 Carotids 2+ bilaterally; no  bruits. No lymphadenopathy or thryomegaly appreciated. Cor: PMI normal. Regular  rate &  rhythm. No rubs, gallops or murmurs. Lungs: clear Abdomen: soft, nontender, nondistended. No hepatosplenomegaly. No bruits or masses. Good bowel sounds. Extremities: no cyanosis, clubbing, rash, R and LLE severe varicose veins no edema.   Neuro: alert & orientedx3, cranial nerves grossly intact. Moves all 4 extremities w/o difficulty. Affect pleasant.   ASSESSMENT & PLAN:  1. Chronic Diastolic Heart Failure - She has a very narrow euvolemic window. Volume  status ok/ -Bump torsemide back up to 40 mg once a day.  Take metolazone only for weight 135 or greater.   Reinforced the need and importance of daily weights, a low sodium diet, and fluid restriction (less than 2 L a day). Instructed to call the HF clinic if weight increases more than 3 lbs overnight or 5 lbs in a week.  2.PAF -  symptomatic bradycardia s/p PPM.  Peviously thought to have failed amiodarone and had chronic AF. However in November in the hospital she was in NSR so amio restarted. - Continue amiodarone 200 mg daily. Continue Eliquis 2.5 mg twice a day. No bleeding problems.   3. Hypotension - Improved. Now off midodrine. Can restart as needed.  4. CKD stage III - Reviewed BMET form 03/23/15. Renal function ok.  5. Deconditioning- Continue HHPT.  6. Hypokalemia - increase kcl to 60 daily.  7. Chest pain - resolved. Stress test normal 5/16.   Follow up in 2 months   Josha Weekley MD  3:43 PM

## 2015-04-16 ENCOUNTER — Encounter: Payer: Self-pay | Admitting: Internal Medicine

## 2015-04-17 ENCOUNTER — Encounter: Payer: Self-pay | Admitting: Cardiology

## 2015-04-22 ENCOUNTER — Other Ambulatory Visit: Payer: Self-pay | Admitting: Internal Medicine

## 2015-04-22 DIAGNOSIS — I6523 Occlusion and stenosis of bilateral carotid arteries: Secondary | ICD-10-CM

## 2015-04-23 ENCOUNTER — Encounter (HOSPITAL_COMMUNITY): Payer: Medicare Other

## 2015-04-29 ENCOUNTER — Encounter (HOSPITAL_COMMUNITY): Payer: Medicare Other

## 2015-05-01 ENCOUNTER — Other Ambulatory Visit: Payer: Self-pay | Admitting: Internal Medicine

## 2015-05-04 NOTE — Telephone Encounter (Signed)
Sent to the pharmacy by e-scribe. 

## 2015-05-09 ENCOUNTER — Emergency Department (HOSPITAL_COMMUNITY): Payer: Medicare Other

## 2015-05-09 ENCOUNTER — Inpatient Hospital Stay (HOSPITAL_COMMUNITY)
Admission: EM | Admit: 2015-05-09 | Discharge: 2015-05-10 | DRG: 313 | Disposition: A | Discharge status: Home / Self Care | Payer: Medicare Other | Attending: Internal Medicine | Admitting: Internal Medicine

## 2015-05-09 ENCOUNTER — Encounter (HOSPITAL_COMMUNITY): Payer: Self-pay | Admitting: *Deleted

## 2015-05-09 DIAGNOSIS — Z950 Presence of cardiac pacemaker: Secondary | ICD-10-CM

## 2015-05-09 DIAGNOSIS — I248 Other forms of acute ischemic heart disease: Secondary | ICD-10-CM | POA: Diagnosis present

## 2015-05-09 DIAGNOSIS — N179 Acute kidney failure, unspecified: Secondary | ICD-10-CM | POA: Diagnosis not present

## 2015-05-09 DIAGNOSIS — Z7901 Long term (current) use of anticoagulants: Secondary | ICD-10-CM

## 2015-05-09 DIAGNOSIS — R079 Chest pain, unspecified: Secondary | ICD-10-CM | POA: Diagnosis present

## 2015-05-09 DIAGNOSIS — Z91048 Other nonmedicinal substance allergy status: Secondary | ICD-10-CM

## 2015-05-09 DIAGNOSIS — E669 Obesity, unspecified: Secondary | ICD-10-CM | POA: Diagnosis present

## 2015-05-09 DIAGNOSIS — D631 Anemia in chronic kidney disease: Secondary | ICD-10-CM | POA: Diagnosis present

## 2015-05-09 DIAGNOSIS — R0782 Intercostal pain: Secondary | ICD-10-CM | POA: Diagnosis not present

## 2015-05-09 DIAGNOSIS — Z79899 Other long term (current) drug therapy: Secondary | ICD-10-CM

## 2015-05-09 DIAGNOSIS — Z6828 Body mass index (BMI) 28.0-28.9, adult: Secondary | ICD-10-CM | POA: Diagnosis not present

## 2015-05-09 DIAGNOSIS — Z885 Allergy status to narcotic agent status: Secondary | ICD-10-CM

## 2015-05-09 DIAGNOSIS — I4891 Unspecified atrial fibrillation: Secondary | ICD-10-CM | POA: Diagnosis present

## 2015-05-09 DIAGNOSIS — I5032 Chronic diastolic (congestive) heart failure: Secondary | ICD-10-CM | POA: Diagnosis not present

## 2015-05-09 DIAGNOSIS — M1990 Unspecified osteoarthritis, unspecified site: Secondary | ICD-10-CM | POA: Diagnosis present

## 2015-05-09 DIAGNOSIS — D472 Monoclonal gammopathy: Secondary | ICD-10-CM | POA: Diagnosis present

## 2015-05-09 DIAGNOSIS — R0789 Other chest pain: Secondary | ICD-10-CM | POA: Diagnosis present

## 2015-05-09 DIAGNOSIS — K219 Gastro-esophageal reflux disease without esophagitis: Secondary | ICD-10-CM | POA: Diagnosis present

## 2015-05-09 DIAGNOSIS — E876 Hypokalemia: Secondary | ICD-10-CM | POA: Diagnosis present

## 2015-05-09 DIAGNOSIS — D509 Iron deficiency anemia, unspecified: Secondary | ICD-10-CM | POA: Diagnosis present

## 2015-05-09 DIAGNOSIS — I495 Sick sinus syndrome: Secondary | ICD-10-CM | POA: Diagnosis present

## 2015-05-09 DIAGNOSIS — I5042 Chronic combined systolic (congestive) and diastolic (congestive) heart failure: Secondary | ICD-10-CM | POA: Diagnosis present

## 2015-05-09 DIAGNOSIS — D638 Anemia in other chronic diseases classified elsewhere: Secondary | ICD-10-CM | POA: Diagnosis present

## 2015-05-09 DIAGNOSIS — N184 Chronic kidney disease, stage 4 (severe): Secondary | ICD-10-CM | POA: Diagnosis present

## 2015-05-09 DIAGNOSIS — Z8249 Family history of ischemic heart disease and other diseases of the circulatory system: Secondary | ICD-10-CM

## 2015-05-09 DIAGNOSIS — I10 Essential (primary) hypertension: Secondary | ICD-10-CM | POA: Diagnosis present

## 2015-05-09 DIAGNOSIS — Z9049 Acquired absence of other specified parts of digestive tract: Secondary | ICD-10-CM | POA: Diagnosis present

## 2015-05-09 DIAGNOSIS — R7989 Other specified abnormal findings of blood chemistry: Secondary | ICD-10-CM

## 2015-05-09 DIAGNOSIS — I5033 Acute on chronic diastolic (congestive) heart failure: Secondary | ICD-10-CM | POA: Diagnosis not present

## 2015-05-09 DIAGNOSIS — I2510 Atherosclerotic heart disease of native coronary artery without angina pectoris: Secondary | ICD-10-CM | POA: Diagnosis present

## 2015-05-09 DIAGNOSIS — Z85828 Personal history of other malignant neoplasm of skin: Secondary | ICD-10-CM

## 2015-05-09 DIAGNOSIS — R202 Paresthesia of skin: Secondary | ICD-10-CM

## 2015-05-09 DIAGNOSIS — I481 Persistent atrial fibrillation: Secondary | ICD-10-CM | POA: Diagnosis present

## 2015-05-09 DIAGNOSIS — E785 Hyperlipidemia, unspecified: Secondary | ICD-10-CM | POA: Diagnosis present

## 2015-05-09 DIAGNOSIS — E039 Hypothyroidism, unspecified: Secondary | ICD-10-CM | POA: Diagnosis present

## 2015-05-09 DIAGNOSIS — I509 Heart failure, unspecified: Secondary | ICD-10-CM

## 2015-05-09 DIAGNOSIS — R778 Other specified abnormalities of plasma proteins: Secondary | ICD-10-CM | POA: Diagnosis present

## 2015-05-09 DIAGNOSIS — M5135 Other intervertebral disc degeneration, thoracolumbar region: Secondary | ICD-10-CM | POA: Diagnosis present

## 2015-05-09 DIAGNOSIS — I129 Hypertensive chronic kidney disease with stage 1 through stage 4 chronic kidney disease, or unspecified chronic kidney disease: Secondary | ICD-10-CM | POA: Diagnosis present

## 2015-05-09 DIAGNOSIS — R200 Anesthesia of skin: Secondary | ICD-10-CM

## 2015-05-09 LAB — URINALYSIS, ROUTINE W REFLEX MICROSCOPIC
Bilirubin Urine: NEGATIVE
GLUCOSE, UA: NEGATIVE mg/dL
Hgb urine dipstick: NEGATIVE
Ketones, ur: NEGATIVE mg/dL
NITRITE: NEGATIVE
Protein, ur: NEGATIVE mg/dL
Specific Gravity, Urine: 1.008 (ref 1.005–1.030)
Urobilinogen, UA: 0.2 mg/dL (ref 0.0–1.0)
pH: 5.5 (ref 5.0–8.0)

## 2015-05-09 LAB — CBC WITH DIFFERENTIAL/PLATELET
BASOS ABS: 0 10*3/uL (ref 0.0–0.1)
BASOS PCT: 1 % (ref 0–1)
EOS ABS: 0.1 10*3/uL (ref 0.0–0.7)
EOS PCT: 2 % (ref 0–5)
HCT: 39.3 % (ref 36.0–46.0)
Hemoglobin: 13.1 g/dL (ref 12.0–15.0)
LYMPHS ABS: 0.9 10*3/uL (ref 0.7–4.0)
Lymphocytes Relative: 21 % (ref 12–46)
MCH: 31.3 pg (ref 26.0–34.0)
MCHC: 33.3 g/dL (ref 30.0–36.0)
MCV: 94 fL (ref 78.0–100.0)
Monocytes Absolute: 0.4 10*3/uL (ref 0.1–1.0)
Monocytes Relative: 8 % (ref 3–12)
NEUTROS PCT: 68 % (ref 43–77)
Neutro Abs: 3 10*3/uL (ref 1.7–7.7)
Platelets: 136 10*3/uL — ABNORMAL LOW (ref 150–400)
RBC: 4.18 MIL/uL (ref 3.87–5.11)
RDW: 12.8 % (ref 11.5–15.5)
WBC: 4.3 10*3/uL (ref 4.0–10.5)

## 2015-05-09 LAB — URINE MICROSCOPIC-ADD ON

## 2015-05-09 LAB — BASIC METABOLIC PANEL
Anion gap: 13 (ref 5–15)
BUN: 40 mg/dL — ABNORMAL HIGH (ref 6–20)
CHLORIDE: 98 mmol/L — AB (ref 101–111)
CO2: 29 mmol/L (ref 22–32)
Calcium: 9.4 mg/dL (ref 8.9–10.3)
Creatinine, Ser: 2.03 mg/dL — ABNORMAL HIGH (ref 0.44–1.00)
GFR calc non Af Amer: 21 mL/min — ABNORMAL LOW (ref >60)
GFR, EST AFRICAN AMERICAN: 24 mL/min — AB (ref >60)
Glucose, Bld: 95 mg/dL (ref 65–99)
Potassium: 3.4 mmol/L — ABNORMAL LOW (ref 3.5–5.1)
SODIUM: 140 mmol/L (ref 135–145)

## 2015-05-09 LAB — BRAIN NATRIURETIC PEPTIDE: B Natriuretic Peptide: 294.6 pg/mL — ABNORMAL HIGH (ref 0.0–100.0)

## 2015-05-09 LAB — TROPONIN I
TROPONIN I: 0.06 ng/mL — AB (ref <0.031)
Troponin I: 0.05 ng/mL — ABNORMAL HIGH (ref <0.031)

## 2015-05-09 LAB — TSH: TSH: 1.701 u[IU]/mL (ref 0.350–4.500)

## 2015-05-09 MED ORDER — ONDANSETRON HCL 4 MG PO TABS: 4.0000 mg | ORAL_TABLET | Freq: Four times a day (QID) | ORAL | Status: DC | PRN

## 2015-05-09 MED ORDER — LEVOTHYROXINE SODIUM 75 MCG PO TABS
75.0000 ug | ORAL_TABLET | Freq: Every day | ORAL | Status: DC
  Administered 2015-05-10: 75 ug via ORAL
  Filled 2015-05-09 (×2): qty 1

## 2015-05-09 MED ORDER — AMIODARONE HCL 200 MG PO TABS
200.0000 mg | ORAL_TABLET | Freq: Every day | ORAL | Status: DC
  Administered 2015-05-10: 200 mg via ORAL
  Filled 2015-05-09: qty 1

## 2015-05-09 MED ORDER — APIXABAN 2.5 MG PO TABS
2.5000 mg | ORAL_TABLET | Freq: Two times a day (BID) | ORAL | Status: DC
  Administered 2015-05-09 – 2015-05-10 (×2): 2.5 mg via ORAL
  Filled 2015-05-09 (×3): qty 1

## 2015-05-09 MED ORDER — LORATADINE 10 MG PO TABS
10.0000 mg | ORAL_TABLET | Freq: Every day | ORAL | Status: DC
  Administered 2015-05-09: 10 mg via ORAL
  Filled 2015-05-09 (×2): qty 1

## 2015-05-09 MED ORDER — TORSEMIDE 20 MG PO TABS
40.0000 mg | ORAL_TABLET | Freq: Every day | ORAL | Status: DC
  Administered 2015-05-09 – 2015-05-10 (×2): 40 mg via ORAL
  Filled 2015-05-09 (×2): qty 2

## 2015-05-09 MED ORDER — PANTOPRAZOLE SODIUM 40 MG PO TBEC
40.0000 mg | DELAYED_RELEASE_TABLET | Freq: Every day | ORAL | Status: DC
  Administered 2015-05-09 – 2015-05-10 (×2): 40 mg via ORAL
  Filled 2015-05-09 (×2): qty 1

## 2015-05-09 MED ORDER — POTASSIUM CHLORIDE CRYS ER 20 MEQ PO TBCR
20.0000 meq | EXTENDED_RELEASE_TABLET | Freq: Once | ORAL | Status: AC
  Administered 2015-05-10: 20 meq via ORAL
  Filled 2015-05-09: qty 1

## 2015-05-09 MED ORDER — SIMVASTATIN 10 MG PO TABS
10.0000 mg | ORAL_TABLET | Freq: Every day | ORAL | Status: DC
  Administered 2015-05-09: 10 mg via ORAL
  Filled 2015-05-09 (×2): qty 1

## 2015-05-09 MED ORDER — SODIUM CHLORIDE 0.9 % IJ SOLN
3.0000 mL | Freq: Two times a day (BID) | INTRAMUSCULAR | Status: DC
  Administered 2015-05-09 – 2015-05-10 (×2): 3 mL via INTRAVENOUS

## 2015-05-09 MED ORDER — ONDANSETRON HCL 4 MG/2ML IJ SOLN: 4.0000 mg | Freq: Four times a day (QID) | INTRAMUSCULAR | Status: DC | PRN

## 2015-05-09 MED ORDER — ACETAMINOPHEN 325 MG PO TABS
650.0000 mg | ORAL_TABLET | ORAL | Status: DC | PRN
  Administered 2015-05-10: 650 mg via ORAL
  Filled 2015-05-09: qty 2

## 2015-05-09 MED ORDER — METOLAZONE 2.5 MG PO TABS
2.5000 mg | ORAL_TABLET | ORAL | Status: DC | PRN
  Filled 2015-05-09: qty 1

## 2015-05-09 MED ORDER — FERROUS SULFATE 325 (65 FE) MG PO TABS
325.0000 mg | ORAL_TABLET | Freq: Two times a day (BID) | ORAL | Status: DC
  Administered 2015-05-10: 325 mg via ORAL
  Filled 2015-05-09 (×3): qty 1

## 2015-05-09 NOTE — Progress Notes (Signed)
Admitted pt from ED awake alert oriented x4. Oriented to room and call bell.tele on. VS taken and recorded.Claims pain free.

## 2015-05-09 NOTE — ED Notes (Signed)
Pt. Is going to x-ray. 

## 2015-05-09 NOTE — H&P (Signed)
[facility] History and Physical  [name] IEP:[id number] DOB: 06/13/1925 DOA: 05/09/2015  Referring physician: Dr. Ernestina Patches  PCP: [name], MD  Chief Complaint: chest pain  HPI:  81 year old female with past medical history of chronic diastolic CHF, HTN, a-fib on Eliquis, last cardiac cath 01/29/2014 which demonstrated mild CAD, pacemaker for sick sinus syndrome and a fib, hypothyroidism, dyslipidemia who presented to [facility] ED with main concern of sudden onset of sharp, left side chest pain started earlier this am prior to this admission. Per patient, pain is located in left lower chest area (almost upper epigastric area), started at rest, reproducible, at worst 6/10 in intensity and associated with right hand pain and numbness. Chest pain resolved spontaneously but does hurt when pt presses on the above mentioned location. Pain and numbness in the hand have completely resolved. No fevers, no cough, no shortness of breath. No palpitations. No abdominal pain, nausea or vomiting. No lightheadedness or loss of consciousness. No leg swelling. No blood in stool or urine.   In ED, BP was 96/40 but has improved to 136/56. Her 12 lead EKG showed paced rhythm. CT head showed no acute intracranial abnormalities. CXR showed no acute cardiopulmonary findings. Blood work showed platelets of 136, potassium 3.4, creatinine 2.03 and mild elevation in troponin at 0.05.  Cardiology has seen the pt in consultation.    Assessment & Plan    Principal Problem:   Chest pain - Most likely non cardiac etiology, as mentioned in HPI her pain seems to be located in almost epigastric area - Cardio has seen the pt in consultation and also believes non cardiac etiology of chest pain - We will however cycle cardiac enzymes - She has had recent stress test which was negative   Active Problems:   Chronic combined systolic and diastolic CHF (congestive heart failure) /  Pacemaker - Compensated   - Her dry weight is 134 lbs - 136 lbs, at baseline - Resume torsemide     Hyperlipidemia - Continue statin therapy     HYPERTENSION, BENIGN - Continue torsemide    Hypothyroidism - Continue synthroid    Atrial fibrillation / Chronic anticoagulation - CHADS2 vasc score at least 3 - On Eliquis - Rate controlled with amiodarone     Iron deficiency anemia / Anemia of chronic kidney disease - Hemoglobin stable  - Continue ferrous sulfate     Elevated troponin I level - Likely demand ischemia from CKD - Mild elevation at 0.05 - Cycling cardiac enzymes - Recent stress test negative     CKD (chronic kidney disease) stage 4, GFR 15-29 ml/min - Cr around patient's baseline values of about 2    Hypokalemia - From torsemide - Supplemented    DVT prophylaxis - On Eliquis   Radiological Exams on Admission: Dg Chest 2 View 05/09/2015  1. No acute cardiopulmonary abnormalities. 2. Aortic atherosclerosis.   Electronically Signed   By: Kerby Moors M.D.   On: 05/09/2015 16:46   Ct Head Wo Contrast 05/09/2015  No evidence of acute intracranial abnormality.  Mild age related atrophy with small vessel ischemic changes.   Electronically Signed   By: Julian Hy M.D.   On: 05/09/2015 17:04    EKG: I have personally reviewed EKG. EKG shows paced rhythm   Code Status: Full Family Communication: Plan of care discussed with the patient  Disposition Plan: Admit for further evaluation, telemetry   Leisa Lenz, MD  [facility] Pager [phone]  Time  spent in minutes: 75 minutes  Review of Systems:  Constitutional: Negative for fever, chills and malaise/fatigue. Negative for diaphoresis.  HENT: Negative for hearing loss, ear pain, nosebleeds, congestion, sore throat, neck pain, tinnitus and ear discharge.   Eyes: Negative for blurred vision, double vision, photophobia, pain, discharge and redness.  Respiratory: Negative for cough, hemoptysis, sputum production, shortness of  breath, wheezing and stridor.   Cardiovascular: positive for chest pain, no palpitations, orthopnea, claudication or leg swelling.  Gastrointestinal: Negative for nausea, vomiting and abdominal pain. Negative for heartburn, constipation, blood in stool and melena.  Genitourinary: Negative for dysuria, urgency, frequency, hematuria and flank pain.  Musculoskeletal: Negative for myalgias, back pain, joint pain and falls.  Skin: Negative for itching and rash.  Neurological: Negative for dizziness and weakness. Negative for tingling, tremors, sensory change, speech change, focal weakness, loss of consciousness and headaches.  Endo/Heme/Allergies: Negative for environmental allergies and polydipsia. Does not bruise/bleed easily.  Psychiatric/Behavioral: Negative for suicidal ideas. The patient is not nervous/anxious.      Past Medical History  Diagnosis Date  . Dyslipidemia   . Hypertension   . Palpitations     PVCs/bigeminy on event in May 2009 revealing this was relatively asymptomatic  . Chest pain     a. 2011 Neg MV;  b. 01/2014 Cath: LM 20-30, LAD nl, D1 nl, LCX nl, OM1 nl, RCA dom 30-68m, PD/PL nl, EF 65%->Med Rx.  . Degenerative joint disease   . Hx of varicella   . History of skin cancer     tafeen/ non melanoma.   . Thrombocytopenia   . Anemia   . Monoclonal gammopathies   . Right lower quadrant abdominal pain 07/19/2012    Recurrent  With nausea    This is new since she had ct for llq pain in [facility]    R/o appendiceal problem  Hernia  Get ct scan  And plan  Fu     . Diastolic CHF, acute on chronic 01/31/2012  . Atrial fibrillation     a. Dx 01/2014->Eliquis started.  . Cancer   . Pacemaker   . Dysrhythmia   . Family history of adverse reaction to anesthesia     " multiple family members have difficulty waking "  . GERD (gastroesophageal reflux disease)   . Hypothyroidism    Past Surgical History  Procedure Laterality Date  . Cholecystectomy  1999  . Cesarean section       times 2  . Shoulder surgery  1996    Right   . Cataract extraction      Bilateral  implantt  . Cardioversion N/A 02/26/2014    Procedure: CARDIOVERSION AT BEDSIDE;  Surgeon: Pixie Casino, MD;  Location: [facility];  Service: Cardiovascular;  Laterality: N/A;  . Cardioversion N/A 04/22/2014    Procedure: CARDIOVERSION (BEDSIDE);  Surgeon: [name], MD;  Location: [facility] OR;  Service: Cardiovascular;  Laterality: N/A;  . Pacemaker insertion  04/23/2014    STJ Assurity dual chamber pacemaker implanted by Dr Rayann Heman  . Insert / replace / remove pacemaker    . Esophagogastroduodenoscopy N/A 07/06/2014    Procedure: ESOPHAGOGASTRODUODENOSCOPY (EGD);  Surgeon: Ladene Artist, MD;  Location: [facility] ENDOSCOPY;  Service: Endoscopy;  Laterality: N/A;  . Left heart catheterization with coronary angiogram N/A 01/29/2014    Procedure: LEFT HEART CATHETERIZATION WITH CORONARY ANGIOGRAM;  Surgeon: Blane Ohara, MD;  Location: [facility] CATH LAB;  Service: Cardiovascular;  Laterality: N/A;  . Permanent pacemaker insertion N/A 04/23/2014  Procedure: PERMANENT PACEMAKER INSERTION;  Surgeon: Evans Lance, MD;  Location: [facility] C[facility] LAB;  Service: Cardiovascular;  Laterality: N/A;  . Right heart catheterization N/A 09/22/2014    Procedure: RIGHT HEART CATH;  Surgeon: Jolaine Artist, MD;  Location: [facility] CATH LAB;  Service: Cardiovascular;  Laterality: N/A;   Social History:  reports that she has never smoked. She has never used smokeless tobacco. She reports that she does not drink alcohol or use illicit drugs.  Allergies  Allergen Reactions  . Other Shortness Of Breath    Detergents, perfumes and other strong odor emitting compounds  . Tramadol Nausea Only    Family History:  Family History  Problem Relation Age of Onset  . Coronary artery disease Father   . Heart disease Father   . Lung cancer    . Alzheimer's disease Mother   . Cancer Brother 34    lung cancer  . Heart attack Father     Prior to Admission  medications   Medication Sig Start Date End Date Taking? Authorizing Provider  amiodarone (PACERONE) 200 MG tablet Take 1 tablet (200 mg total) by mouth daily. 11/06/14  Yes Jolaine Artist, MD  apixaban (ELIQUIS) 2.5 MG TABS tablet Take 1 tablet (2.5 mg total) by mouth 2 (two) times daily. 12/30/14  Yes Jolaine Artist, MD  ferrous sulfate 325 (65 FE) MG tablet Take 1 tablet (325 mg total) by mouth 2 (two) times daily with a meal. 11/21/14  Yes Burnis Medin, MD  levothyroxine (SYNTHROID, LEVOTHROID) 75 MCG tablet Take 1 tablet (75 mcg total) by mouth daily. 11/21/14  Yes Burnis Medin, MD  metolazone (ZAROXOLYN) 2.5 MG tablet Take 1 tablet (2.5 mg total) by mouth as needed (for weight 136 lb or greater). 12/16/14  Yes Shaune Pascal Bensimhon, MD  pantoprazole (PROTONIX) 40 MG tablet TAKE ONE TABLET BY MOUTH ONCE DAILY 05/04/15  Yes Burnis Medin, MD  potassium chloride SA (KLOR-CON M20) 20 MEQ tablet Take 1 tablet (20 mEq total) by mouth 3 (three) times daily. Please take 20 meq three times daily. Patient taking differently: Take 60 mEq by mouth every morning.  04/08/15  Yes Jolaine Artist, MD  simvastatin (ZOCOR) 10 MG tablet Take 1 tablet (10 mg total) by mouth at bedtime. 11/06/14  Yes Jolaine Artist, MD  torsemide (DEMADEX) 20 MG tablet Take 2 tablets (40 mg total) by mouth daily. 04/08/15  Yes Jolaine Artist, MD   Physical Exam: Filed Vitals:   05/09/15 1715 05/09/15 1745 05/09/15 1823 05/09/15 1831  BP: 96/40 108/93 104/46 127/43  Pulse: 69 70 70 70  Temp:      Resp: 17 15 18 18   Height:      Weight:      SpO2: 97% 95% 99% 99%    Physical Exam  Constitutional: Appears well-developed and well-nourished. No distress.  HENT: Normocephalic. No tonsillar erythema or exudates Eyes: Conjunctivae are normal. No scleral icterus.  Neck: Normal ROM. Neck supple. No JVD. No tracheal deviation. No thyromegaly.  CVS: RRR, S1/S2 appreciated.  Pulmonary: Effort and breath sounds normal,  no stridor, rhonchi, wheezes, rales.  Abdominal: Soft. BS +,  no distension, tenderness, rebound or guarding.  Musculoskeletal: Normal range of motion. No edema and no tenderness.  Lymphadenopathy: No lymphadenopathy noted, cervical, inguinal. Neuro: Alert. Normal reflexes, muscle tone coordination. No focal neurologic deficits. Skin: Skin is warm and dry. No rash noted.  No erythema. No pallor.  Psychiatric: Normal mood and  affect. Behavior, judgment, thought content normal.   Labs on Admission:  Basic Metabolic Panel:  Recent Labs Lab 05/09/15 1529  NA 140  K 3.4*  CL 98*  CO2 29  GLUCOSE 95  BUN 40*  CREATININE 2.03*  CALCIUM 9.4   Liver Function Tests: No results for input(s): AST, ALT, ALKPHOS, BILITOT, PROT, ALBUMIN in the last 168 hours. No results for input(s): LIPASE, AMYLASE in the last 168 hours. No results for input(s): AMMONIA in the last 168 hours. CBC:  Recent Labs Lab 05/09/15 1529  WBC 4.3  NEUTROABS 3.0  HGB 13.1  HCT 39.3  MCV 94.0  PLT 136*   Cardiac Enzymes:  Recent Labs Lab 05/09/15 1529  TROPONINI 0.05*    If 7PM-7AM, please contact night-coverage [email or web address] Password [facility] 05/09/2015, 7:07 PM

## 2015-05-09 NOTE — ED Notes (Signed)
The pt is c/o pain in the back of her neck and rt arm  Pain since last pm.  She is also c/o chest and head pain whenever she bends from the waist

## 2015-05-09 NOTE — ED Notes (Signed)
C/o intermittent right and left sided cp that last 7 mins a side or so, ongoing for a year.  Followed by Dr. Zoila Shutter, hx of chf.  Last night cp started at 0100 and she has the pains 3 times a week.  Also c/o right hand pain/numbness that started about the same time and neck/posterior head pain.

## 2015-05-09 NOTE — ED Notes (Signed)
Walk patient to the bathroom patient did well 

## 2015-05-09 NOTE — ED Provider Notes (Signed)
CSN: [id number]     Arrival date & time 05/09/15  1514 History   First MD Initiated Contact with Patient 05/09/15 1541     Chief Complaint  Patient presents with  . Neck Pain  . Chest Pain     (Consider location/radiation/quality/duration/timing/severity/associated sxs/prior Treatment) HPI Comments: Pmhx as above who presents with several complaints including intermittent lower left and right-sided sharp chest pains lasting several minutes at a time starting around 1 AM last night, although occurring intermittently for a year, as well as onset of right hand pain/numbness starting around 11 PM, the most severe symptoms lasting approximately 30 minutes, still having some right lateral hand numbness this morning.  She has had some intermittent sharp pains to her occiput, radiating around to her frontal area.  She denies recent injuries or falls, fevers, chills, diaphoresis, nausea, vomiting, diarrhea or urinary symptoms.  She's had multiple visits for chest pain, most recently on 03/30/2015 and underwent an MR perfusion study as above, but had no inducible ischemia and low-risk stress test findings.  She took metolazone this morning because her weight was up 2 pounds.  She also reports distended bilateral extremity veins without lower extremity pain, or soft tissue swelling.   Patient is a 81 y.o. female presenting with chest pain and neurologic complaint. The history is provided by the patient. No language interpreter was used.  Chest Pain Pain location:  L chest and R chest Pain quality: sharp   Pain radiates to:  Does not radiate Pain radiates to the back: no   Pain severity:  Moderate Onset quality:  Sudden Timing:  Intermittent Progression:  Waxing and waning Chronicity:  Chronic (1 year) Context: at rest   Relieved by:  Nothing Worsened by:  Nothing tried Ineffective treatments:  None tried Associated symptoms: numbness   Associated symptoms: no abdominal pain, no back pain, no cough,  no diaphoresis, no fatigue, no fever, no headache, no nausea, no palpitations, no shortness of breath, not vomiting and no weakness   Risk factors: hypertension   Risk factors: no prior DVT/PE and no smoking   Neurologic Problem This is a new problem. The current episode started 12 to 24 hours ago. The problem occurs constantly. The problem has been gradually improving. Associated symptoms include chest pain. Pertinent negatives include no abdominal pain, no headaches and no shortness of breath. Nothing aggravates the symptoms. Nothing relieves the symptoms. She has tried nothing for the symptoms. The treatment provided no relief.    Past Medical History  Diagnosis Date  . Dyslipidemia   . Hypertension   . Palpitations     PVCs/bigeminy on event in May 2009 revealing this was relatively asymptomatic  . Chest pain     a. 2011 Neg MV;  b. 01/2014 Cath: LM 20-30, LAD nl, D1 nl, LCX nl, OM1 nl, RCA dom 30-31m, PD/PL nl, EF 65%->Med Rx.  . Degenerative joint disease   . Hx of varicella   . History of skin cancer     tafeen/ non melanoma.   . Thrombocytopenia   . Anemia   . Monoclonal gammopathies   . Right lower quadrant abdominal pain 07/19/2012    Recurrent  With nausea    This is new since she had ct for llq pain in [facility]    R/o appendiceal problem  Hernia  Get ct scan  And plan  Fu     . Diastolic CHF, acute on chronic 01/31/2012  . Atrial fibrillation     a. Dx  01/2014->Eliquis started.  . Cancer   . Pacemaker   . Dysrhythmia   . Family history of adverse reaction to anesthesia     " multiple family members have difficulty waking "  . GERD (gastroesophageal reflux disease)   . Hypothyroidism    Past Surgical History  Procedure Laterality Date  . Cholecystectomy  1999  . Cesarean section      times 2  . Shoulder surgery  1996    Right   . Cataract extraction      Bilateral  implantt  . Cardioversion N/A 02/26/2014    Procedure: CARDIOVERSION AT BEDSIDE;  Surgeon: Pixie Casino, MD;  Location: [place];  Service: Cardiovascular;  Laterality: N/A;  . Cardioversion N/A 04/22/2014    Procedure: CARDIOVERSION (BEDSIDE);  Surgeon: [name], MD;  Location: [facility];  Service: Cardiovascular;  Laterality: N/A;  . Pacemaker insertion  04/23/2014    STJ Assurity dual chamber pacemaker implanted by Dr Rayann Heman  . Insert / replace / remove pacemaker    . Esophagogastroduodenoscopy N/A 07/06/2014    Procedure: ESOPHAGOGASTRODUODENOSCOPY (EGD);  Surgeon: Ladene Artist, MD;  Location: [facility] ENDOSCOPY;  Service: Endoscopy;  Laterality: N/A;  . Left heart catheterization with coronary angiogram N/A 01/29/2014    Procedure: LEFT HEART CATHETERIZATION WITH CORONARY ANGIOGRAM;  Surgeon: Blane Ohara, MD;  Location: [facility] CATH LAB;  Service: Cardiovascular;  Laterality: N/A;  . Permanent pacemaker insertion N/A 04/23/2014    Procedure: PERMANENT PACEMAKER INSERTION;  Surgeon: Evans Lance, MD;  Location: [facility] CATH LAB;  Service: Cardiovascular;  Laterality: N/A;  . Right heart catheterization N/A 09/22/2014    Procedure: RIGHT HEART CATH;  Surgeon: Jolaine Artist, MD;  Location: [facility] CATH LAB;  Service: Cardiovascular;  Laterality: N/A;   Family History  Problem Relation Age of Onset  . Coronary artery disease Father   . Heart disease Father   . Lung cancer    . Alzheimer's disease Mother   . Cancer Brother 20    lung cancer  . Heart attack Father    History  Substance Use Topics  . Smoking status: Never Smoker   . Smokeless tobacco: Never Used  . Alcohol Use: No   OB History    No data available     Review of Systems  Constitutional: Negative for fever, chills, diaphoresis, activity change, appetite change and fatigue.  HENT: Negative for congestion, facial swelling, rhinorrhea and sore throat.   Eyes: Negative for photophobia and discharge.  Respiratory: Negative for cough, chest tightness and shortness of breath.   Cardiovascular: Positive for chest pain. Negative for  palpitations and leg swelling.  Gastrointestinal: Negative for nausea, vomiting, abdominal pain and diarrhea.  Endocrine: Negative for polydipsia and polyuria.  Genitourinary: Negative for dysuria, frequency, difficulty urinating and pelvic pain.  Musculoskeletal: Positive for neck pain. Negative for back pain, arthralgias and neck stiffness.  Skin: Negative for color change and wound.  Allergic/Immunologic: Negative for immunocompromised state.  Neurological: Positive for numbness. Negative for facial asymmetry, weakness and headaches.  Hematological: Does not bruise/bleed easily.  Psychiatric/Behavioral: Negative for confusion and agitation.      Allergies  Other and Tramadol  Home Medications   Prior to Admission medications   Medication Sig Start Date End Date Taking? Authorizing Provider  acetaminophen (TYLENOL) 325 MG tablet Take 2 tablets (650 mg total) by mouth every 4 (four) hours as needed for headache or mild pain. 09/26/14  Yes Isaiah Serge, NP  amiodarone ([place])  200 MG tablet Take 1 tablet (200 mg total) by mouth daily. 11/06/14  Yes Jolaine Artist, MD  apixaban (ELIQUIS) 2.5 MG TABS tablet Take 1 tablet (2.5 mg total) by mouth 2 (two) times daily. 12/30/14  Yes Jolaine Artist, MD  ferrous sulfate 325 (65 FE) MG tablet Take 1 tablet (325 mg total) by mouth 2 (two) times daily with a meal. 11/21/14  Yes Burnis Medin, MD  levothyroxine (SYNTHROID, LEVOTHROID) 75 MCG tablet Take 1 tablet (75 mcg total) by mouth daily. 11/21/14  Yes Burnis Medin, MD  loratadine (CLARITIN) 10 MG tablet Take 10 mg by mouth at bedtime.    Yes Historical Provider, MD  metolazone (ZAROXOLYN) 2.5 MG tablet Take 1 tablet (2.5 mg total) by mouth as needed (for weight 136 lb or greater). 12/16/14  Yes Jolaine Artist, MD  nitroGLYCERIN (NITROSTAT) 0.4 MG SL tablet Place 1 tablet (0.4 mg total) under the tongue every 5 (five) minutes as needed. For chest pain 12/02/13  Yes Fay Records, MD   pantoprazole (PROTONIX) 40 MG tablet TAKE ONE TABLET BY MOUTH ONCE DAILY 05/04/15  Yes Burnis Medin, MD  potassium chloride SA (KLOR-CON M20) 20 MEQ tablet Take 1 tablet (20 mEq total) by mouth 3 (three) times daily. Please take 20 meq three times daily. Patient taking differently: Take 60 mEq by mouth every morning.  04/08/15  Yes Jolaine Artist, MD  simvastatin (ZOCOR) 10 MG tablet Take 1 tablet (10 mg total) by mouth at bedtime. 11/06/14  Yes Jolaine Artist, MD  torsemide (DEMADEX) 20 MG tablet Take 2 tablets (40 mg total) by mouth daily. 04/08/15  Yes Shaune Pascal Bensimhon, MD   BP 96/56 mmHg  Pulse 70  Temp(Src) 97.7 F (36.5 C) (Oral)  Resp 18  Ht 4\' 10"  (1.473 m)  Wt 133 lb 11.2 oz (60.646 kg)  BMI 27.95 kg/m2  SpO2 96% Physical Exam  Constitutional: She is oriented to person, place, and time. She appears well-developed and well-nourished. No distress.  HENT:  Head: Normocephalic and atraumatic.  Mouth/Throat: No oropharyngeal exudate.  Eyes: Pupils are equal, round, and reactive to light.  Neck: Normal range of motion. Neck supple.  Cardiovascular: Normal rate, regular rhythm and normal heart sounds.  Exam reveals no gallop and no friction rub.   No murmur heard. Pulmonary/Chest: Effort normal and breath sounds normal. No respiratory distress. She has no wheezes. She has no rales.  Abdominal: Soft. Bowel sounds are normal. She exhibits no distension and no mass. There is no tenderness. There is no rebound and no guarding.  Musculoskeletal: Normal range of motion. She exhibits no edema or tenderness.  Neurological: She is alert and oriented to person, place, and time. She has normal strength. She displays no atrophy and no tremor. A sensory deficit is present. No cranial nerve deficit. She exhibits normal muscle tone. She displays no seizure activity. Coordination normal. GCS eye subscore is 4. GCS verbal subscore is 5. GCS motor subscore is 6.  Decreased sensation to the  right hand in an ulnar distribution  Skin: Skin is warm and dry.  Psychiatric: She has a normal mood and affect.    ED Course  Procedures (including critical care time) Labs Review Labs Reviewed  BASIC METABOLIC PANEL - Abnormal; Notable for the following:    Potassium 3.4 (*)    Chloride 98 (*)    BUN 40 (*)    Creatinine, Ser 2.03 (*)    GFR calc  non Af Amer 21 (*)    GFR calc Af Amer 24 (*)    All other components within normal limits  TROPONIN I - Abnormal; Notable for the following:    Troponin I 0.05 (*)    All other components within normal limits  BRAIN NATRIURETIC PEPTIDE - Abnormal; Notable for the following:    B Natriuretic Peptide 294.6 (*)    All other components within normal limits  CBC WITH DIFFERENTIAL/PLATELET - Abnormal; Notable for the following:    Platelets 136 (*)    All other components within normal limits  URINALYSIS, ROUTINE W REFLEX MICROSCOPIC (NOT AT [facility]) - Abnormal; Notable for the following:    Leukocytes, UA TRACE (*)    All other components within normal limits  URINE MICROSCOPIC-ADD ON - Abnormal; Notable for the following:    Casts HYALINE CASTS (*)    All other components within normal limits  TROPONIN I - Abnormal; Notable for the following:    Troponin I 0.06 (*)    All other components within normal limits  TROPONIN I - Abnormal; Notable for the following:    Troponin I 0.06 (*)    All other components within normal limits  TROPONIN I - Abnormal; Notable for the following:    Troponin I 0.06 (*)    All other components within normal limits  BASIC METABOLIC PANEL - Abnormal; Notable for the following:    Potassium 3.1 (*)    Chloride 94 (*)    CO2 36 (*)    Glucose, Bld 103 (*)    BUN 40 (*)    Creatinine, Ser 2.29 (*)    GFR calc non Af Amer 18 (*)    GFR calc Af Amer 21 (*)    All other components within normal limits  CBC - Abnormal; Notable for the following:    Platelets 134 (*)    All other components within normal  limits  URINE CULTURE  TSH    Imaging Review Dg Chest 2 View  05/09/2015   CLINICAL DATA:  Intermittent right and left-sided chest pain  EXAM: CHEST  2 VIEW  COMPARISON:  5/16/ 16  FINDINGS: Right chest wall pacer device is noted with leads in the right atrial appendage and right ventricle. The heart size appears normal. There is no pleural effusion or edema identified. No airspace consolidation. Aortic atherosclerosis noted.  IMPRESSION: 1. No acute cardiopulmonary abnormalities. 2. Aortic atherosclerosis.   Electronically Signed   By: Kerby Moors M.D.   On: 05/09/2015 16:46   Ct Head Wo Contrast  05/09/2015   CLINICAL DATA:  Right hand pain/ numbness, neck/posterior head pain  EXAM: CT HEAD WITHOUT CONTRAST  TECHNIQUE: Contiguous axial images were obtained from the base of the skull through the vertex without intravenous contrast.  COMPARISON:  02/10/2015  FINDINGS: No evidence of parenchymal hemorrhage or extra-axial fluid collection. No mass lesion, mass effect, or midline shift.  No CT evidence of acute infarction.  Mild subcortical white matter and periventricular small vessel ischemic changes. Intracranial atherosclerosis.  Age related atrophy.  No ventriculomegaly.  The visualized paranasal sinuses are essentially clear. The mastoid air cells are unopacified.  No evidence of calvarial fracture.  IMPRESSION: No evidence of acute intracranial abnormality.  Mild age related atrophy with small vessel ischemic changes.   Electronically Signed   By: Julian Hy M.D.   On: 05/09/2015 17:04     EKG Interpretation   Date/Time:  Saturday May 09 2015 15:20:18 EDT Ventricular  Rate:  70 PR Interval:  194 QRS Duration: 176 QT Interval:  520 QTC Calculation: 561 R Axis:   106 Text Interpretation:  AV dual-paced rhythm Abnormal ECG No significant  change since last tracing Confirmed by Duha Abair  MD, Gennaro Lizotte [phone]) on  05/09/2015 3:40:07 PM     CLINICAL DATA: Chest pain. History of atrial  fibrillation, prior cardiac catheterization, hypertension, CHF.  EXAM: MYOCARDIAL IMAGING WITH SPECT (REST AND PHARMACOLOGIC-STRESS)  GATED LEFT VENTRICULAR WALL MOTION STUDY  LEFT VENTRICULAR EJECTION FRACTION  TECHNIQUE: Standard myocardial SPECT imaging was performed after resting intravenous injection of 10 mCi Tc-51m sestamibi. Subsequently, intravenous infusion of Lexiscan was performed under the supervision of the Cardiology staff. At peak effect of the drug, 30 mCi Tc-20m sestamibi was injected intravenously and standard myocardial SPECT imaging was performed. Quantitative gated imaging was also performed to evaluate left ventricular wall motion, and estimate left ventricular ejection fraction.  COMPARISON: Chest radiograph - 03/30/2015  FINDINGS: Raw images: Moderate breast and chest wall attenuation is seen on both provided rest and stress images. There is no significant patient motion artifact. Mild GI attenuation is seen on both the provided rest and stress images.  Perfusion: There is a minimal amount of attenuation involving the apical aspect of the anterior wall of left ventricle which improves and nearly resolves on the provided stress images and is without associated regional wall motion abnormality. No scintigraphic evidence of prior infarction or pharmacologically induced ischemia.  Wall Motion: Normal left ventricular wall motion. No left ventricular dilation.  Left Ventricular Ejection Fraction: 73 %  End diastolic volume 70 ml  End systolic volume 19 ml  IMPRESSION: 1. No scintigraphic evidence of prior infarction or pharmacologically induced ischemia.  2. Normal left ventricular wall motion.  3. Left ventricular ejection fraction 73%  4. Low-risk stress test findings*.  *2012 Appropriate Use Criteria for Coronary Revascularization Focused Update: J Am Coll Cardiol.  7253;66(4):403-474. http://content.airportbarriers.com.aspx?articleid=1201161   Electronically Signed  By: Sandi Mariscal M.D.  On: 03/31/2015 16:01  MDM   Final diagnoses:  Numbness and tingling in right hand  Atypical chest pain    Pt is a 81 y.o. female with Pmhx as above who presents with several complaints including intermittent lower left and right-sided sharp chest pains lasting several minutes at a time starting around 1 AM last night, although occurring intermittently for a year, as well as onset of right hand pain/numbness starting around 11 PM, the most severe symptoms lasting approximately 30 minutes, still having some right lateral hand numbness this morning.  She has had some intermittent sharp pains to her occiput, radiating around to her frontal area.  She denies recent injuries or falls, fevers, chills, diaphoresis, nausea, vomiting, diarrhea or urinary symptoms.  She's had multiple visits for chest pain, most recently on 03/30/2015 and underwent an MR perfusion study as above, but had no inducible ischemia and low-risk stress test findings.  On PE, VSS, pt in NAD. Cardiopulm exam is benign.  Neuro exam significant only for decreased sensation to the right lateral hand. Trop 0.05 which is unchanged from multiple priors. CT head w/o acute changes. [facility] consulted for admission for possible TIA, have requested cards consult. Spoke w/ cards fellow. Ernestina Patches, MD 05/10/15 1149

## 2015-05-09 NOTE — ED Notes (Signed)
Dr. Docherty at bedside.

## 2015-05-09 NOTE — ED Notes (Signed)
Patient transported to X-ray 

## 2015-05-09 NOTE — Consult Note (Addendum)
CARDIOLOGY INPATIENT CONSULTATION NOTE  Patient ID: [name] MRN: [id number], DOB/AGE: 12/13/1924   Admit date: 05/09/2015   Primary Physician: [name], MD Primary Cardiologist:Dan Bensimohn MD  Reason for Consult:   Chest pain  Requesting Physician: Leisa Lenz MD  HPI: This is a 81 y.o.white female with history of mild obstructive CAD and risk factors (hypertension, obsity, CKD stage 3, HLD), diastolic heart failiure, persistent atrial fibrillation who presented with atypical chest pain and headache to the ED. Patient is admitted under [facility] and is being managed for right hand numbness/chest pain.  Patient is describing chest pain that occurred at rest at a small pin point location in the left breast which stayed for 10 minutes and then improved. It was associated with bra so she took off her bra and the pain improved. The chest pain was sharp in character. She has been having chest pain for the last several months which moves bilatearlly and changes with position. The chest pain is not related to food, cough or breathing. Patient recently had MR perfusion study on 5/16 which was negative for ischemia. She had cardiac cath last year on 01/29/2014 by Dr. Merlene Laughter which demonstrated mild CAD. She had a PPM placed by Dr. Lovena Le for sick sinus syndrome/persistent afib.  EKG demonstrated AV sequential paced rhythm. Patient denied dyspnea, PND, orthopnea, leg swelling, increased abdominal girth, syncope, presyncope, palpitations. She did have inrease in her weight recently and took a dose of metolazone.   Problem List: Past Medical History  Diagnosis Date  . Dyslipidemia   . Hypertension   . Palpitations     PVCs/bigeminy on event in May 2009 revealing this was relatively asymptomatic  . Chest pain     a. 2011 Neg MV;  b. 01/2014 Cath: LM 20-30, LAD nl, D1 nl, LCX nl, OM1 nl, RCA dom 30-52m, PD/PL nl, EF 65%->Med Rx.  . Degenerative joint disease     . Hx of varicella   . History of skin cancer     tafeen/ non melanoma.   . Thrombocytopenia   . Anemia   . Monoclonal gammopathies   . Right lower quadrant abdominal pain 07/19/2012    Recurrent  With nausea    This is new since she had ct for llq pain in [facility]    R/o appendiceal problem  Hernia  Get ct scan  And plan  Fu     . Diastolic CHF, acute on chronic 01/31/2012  . Atrial fibrillation     a. Dx 01/2014->Eliquis started.  . Cancer   . Pacemaker   . Dysrhythmia   . Family history of adverse reaction to anesthesia     " multiple family members have difficulty waking "  . GERD (gastroesophageal reflux disease)   . Hypothyroidism     Past Surgical History  Procedure Laterality Date  . Cholecystectomy  1999  . Cesarean section      times 2  . Shoulder surgery  1996    Right   . Cataract extraction      Bilateral  implantt  . Cardioversion N/A 02/26/2014    Procedure: CARDIOVERSION AT BEDSIDE;  Surgeon: Pixie Casino, MD;  Location: [facility];  Service: Cardiovascular;  Laterality: N/A;  . Cardioversion N/A 04/22/2014    Procedure: CARDIOVERSION (BEDSIDE);  Surgeon: [name], MD;  Location: [facility] OR;  Service: Cardiovascular;  Laterality: N/A;  . Pacemaker insertion  04/23/2014    STJ Assurity dual chamber pacemaker implanted by  Dr Rayann Heman  . Insert / replace / remove pacemaker    . Esophagogastroduodenoscopy N/A 07/06/2014    Procedure: ESOPHAGOGASTRODUODENOSCOPY (EGD);  Surgeon: Ladene Artist, MD;  Location: [facility] ENDOSCOPY;  Service: Endoscopy;  Laterality: N/A;  . Left heart catheterization with coronary angiogram N/A 01/29/2014    Procedure: LEFT HEART CATHETERIZATION WITH CORONARY ANGIOGRAM;  Surgeon: Blane Ohara, MD;  Location: [facility] CATH LAB;  Service: Cardiovascular;  Laterality: N/A;  . Permanent pacemaker insertion N/A 04/23/2014    Procedure: PERMANENT PACEMAKER INSERTION;  Surgeon: Evans Lance, MD;  Location: [facility] CATH LAB;  Service: Cardiovascular;  Laterality: N/A;   . Right heart catheterization N/A 09/22/2014    Procedure: RIGHT HEART CATH;  Surgeon: Jolaine Artist, MD;  Location[facility] CATH LAB;  Service: Cardiovascular;  Laterality: N/A;     Allergies:  Allergies  Allergen Reactions  . Other Shortness Of Breath    Detergents, perfumes and other strong odor emitting compounds  . Tramadol Nausea Only     Home Medications No current facility-administered medications for this encounter.   Current Outpatient Prescriptions  Medication Sig Dispense Refill  . acetaminophen (TYLENOL) 325 MG tablet Take 2 tablets (650 mg total) by mouth every 4 (four) hours as needed for headache or mild pain.    Marland Kitchen amiodarone (PACERONE) 200 MG tablet Take 1 tablet (200 mg total) by mouth daily. 30 tablet 6  . apixaban (ELIQUIS) 2.5 MG TABS tablet Take 1 tablet (2.5 mg total) by mouth 2 (two) times daily. 60 tablet 6  . ferrous sulfate 325 (65 FE) MG tablet Take 1 tablet (325 mg total) by mouth 2 (two) times daily with a meal. 180 tablet 1  . levothyroxine (SYNTHROID, LEVOTHROID) 75 MCG tablet Take 1 tablet (75 mcg total) by mouth daily. 90 tablet 1  . loratadine (CLARITIN) 10 MG tablet Take 10 mg by mouth at bedtime.     . metolazone (ZAROXOLYN) 2.5 MG tablet Take 1 tablet (2.5 mg total) by mouth as needed (for weight 136 lb or greater). 10 tablet 3  . nitroGLYCERIN (NITROSTAT) 0.4 MG SL tablet Place 1 tablet (0.4 mg total) under the tongue every 5 (five) minutes as needed. For chest pain 25 tablet 6  . pantoprazole (PROTONIX) 40 MG tablet TAKE ONE TABLET BY MOUTH ONCE DAILY 30 tablet 4  . potassium chloride SA (KLOR-CON M20) 20 MEQ tablet Take 1 tablet (20 mEq total) by mouth 3 (three) times daily. Please take 20 meq three times daily. (Patient taking differently: Take 60 mEq by mouth every morning. ) 90 tablet 3  . simvastatin (ZOCOR) 10 MG tablet Take 1 tablet (10 mg total) by mouth at bedtime. 30 tablet 6  . torsemide (DEMADEX) 20 MG tablet Take 2 tablets (40 mg  total) by mouth daily. 180 tablet 3     Family History  Problem Relation Age of Onset  . Coronary artery disease Father   . Heart disease Father   . Lung cancer    . Alzheimer's disease Mother   . Cancer Brother 56    lung cancer  . Heart attack Father      History   Social History  . Marital Status: Widowed    Spouse Name: N/A  . Number of Children: 3  . Years of Education: N/A   Occupational History  .      [employer], book keeping   Social History Main Topics  . Smoking status: Never Smoker   . Smokeless tobacco:  Never Used  . Alcohol Use: No  . Drug Use: No  . Sexual Activity: No   Other Topics Concern  . 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 Eats with her.      Daughter handles medications. Sees HH and PT once a week.     Review of Systems: General: recent increase in weight Cardiovascular: varicose veins, chest discomfort  Dermatological: negative for rash Respiratory: negative for wheezing  Urologic: negative for hematuria Abdominal: negative for nausea, vomiting, diarrhea, bright red blood per rectum, melena, or hematemesis Neurologic: negative for visual changes, syncope, or dizziness Hematology: mild anemia Psychiatry: non suicidal/homicidal  Musculoskeletal: back pain   Physical Exam: Vitals: BP 108/54 mmHg  Pulse 70  Temp(Src) 98.3 F (36.8 C) (Oral)  Resp 20  Ht 4\' 10"  (1.473 m)  Wt 61.462 kg (135 lb 8 oz)  BMI 28.33 kg/m2  SpO2 95% General: not in acute distress Neck: JVP <8 cm, neck supple Heart: regular rate and rhythm, S1, S2, no murmurs  Chest: tenderness and small swelling in the breast Back: tenderness on T10-T12 midline level  Lungs: CTAB  GI: non tender, non distended, bowel sounds  present Extremities: no edema Neuro: AAO x 3  Psych: normal affect, no anxiety   Labs:   Results for orders placed or performed during the hospital encounter of 05/09/15 (from the past 24 hour(s))  Basic metabolic panel     Status: Abnormal   Collection Time: 05/09/15  3:29 PM  Result Value Ref Range   Sodium 140 135 - 145 mmol/L   Potassium 3.4 (L) 3.5 - 5.1 mmol/L   Chloride 98 (L) 101 - 111 mmol/L   CO2 29 22 - 32 mmol/L   Glucose, Bld 95 65 - 99 mg/dL   BUN 40 (H) 6 - 20 mg/dL   Creatinine, Ser 2.03 (H) 0.44 - 1.00 mg/dL   Calcium 9.4 8.9 - 10.3 mg/dL   GFR calc non Af Amer 21 (L) >60 mL/min   GFR calc Af Amer 24 (L) >60 mL/min   Anion gap 13 5 - 15  Troponin I     Status: Abnormal   Collection Time: 05/09/15  3:29 PM  Result Value Ref Range   Troponin I 0.05 (H) <0.031 ng/mL  BNP (order ONLY if patient complains of dyspnea/SOB AND you have documented it for THIS visit)     Status: Abnormal   Collection Time: 05/09/15  3:29 PM  Result Value Ref Range   B Natriuretic Peptide 294.6 (H) 0.0 - 100.0 pg/mL  CBC with Differential     Status: Abnormal   Collection Time: 05/09/15  3:29 PM  Result Value Ref Range   WBC 4.3 4.0 - 10.5 K/uL   RBC 4.18 3.87 - 5.11 MIL/uL   Hemoglobin 13.1 12.0 - 15.0 g/dL   HCT 39.3 36.0 - 46.0 %   MCV 94.0 78.0 - 100.0 fL   MCH 31.3 26.0 - 34.0 pg   MCHC 33.3 30.0 - 36.0 g/dL   RDW 12.8 11.5 - 15.5 %   Platelets 136 (L) 150 - 400 K/uL   Neutrophils Relative % 68 43 -  77 %   Neutro Abs 3.0 1.7 - 7.7 K/uL   Lymphocytes Relative 21 12 - 46 %   Lymphs Abs 0.9 0.7 - 4.0 K/uL   Monocytes Relative 8 3 - 12 %   Monocytes Absolute 0.4 0.1 - 1.0 K/uL   Eosinophils Relative 2 0 - 5 %   Eosinophils Absolute 0.1 0.0 - 0.7 K/uL   Basophils Relative 1 0 - 1 %   Basophils Absolute 0.0 0.0 - 0.1 K/uL  Urinalysis, Routine w reflex microscopic (not at [facility])     Status: Abnormal   Collection Time: 05/09/15  3:37 PM  Result Value Ref Range   Color, Urine  YELLOW YELLOW   APPearance CLEAR CLEAR   Specific Gravity, Urine 1.008 1.005 - 1.030   pH 5.5 5.0 - 8.0   Glucose, UA NEGATIVE NEGATIVE mg/dL   Hgb urine dipstick NEGATIVE NEGATIVE   Bilirubin Urine NEGATIVE NEGATIVE   Ketones, ur NEGATIVE NEGATIVE mg/dL   Protein, ur NEGATIVE NEGATIVE mg/dL   Urobilinogen, UA 0.2 0.0 - 1.0 mg/dL   Nitrite NEGATIVE NEGATIVE   Leukocytes, UA TRACE (A) NEGATIVE  Urine microscopic-add on     Status: Abnormal   Collection Time: 05/09/15  3:37 PM  Result Value Ref Range   Squamous Epithelial / LPF RARE RARE   WBC, UA 0-2 <3 WBC/hpf   RBC / HPF 0-2 <3 RBC/hpf   Bacteria, UA RARE RARE   Casts HYALINE CASTS (A) NEGATIVE     Radiology/Studies: Dg Chest 2 View  05/09/2015   CLINICAL DATA:  Intermittent right and left-sided chest pain  EXAM: CHEST  2 VIEW  COMPARISON:  5/16/ 16  FINDINGS: Right chest wall pacer device is noted with leads in the right atrial appendage and right ventricle. The heart size appears normal. There is no pleural effusion or edema identified. No airspace consolidation. Aortic atherosclerosis noted.  IMPRESSION: 1. No acute cardiopulmonary abnormalities. 2. Aortic atherosclerosis.   Electronically Signed   By: Kerby Moors M.D.   On: 05/09/2015 16:46   Ct Head Wo Contrast  05/09/2015   CLINICAL DATA:  Right hand pain/ numbness, neck/posterior head pain  EXAM: CT HEAD WITHOUT CONTRAST  TECHNIQUE: Contiguous axial images were obtained from the base of the skull through the vertex without intravenous contrast.  COMPARISON:  02/10/2015  FINDINGS: No evidence of parenchymal hemorrhage or extra-axial fluid collection. No mass lesion, mass effect, or midline shift.  No CT evidence of acute infarction.  Mild subcortical white matter and periventricular small vessel ischemic changes. Intracranial atherosclerosis.  Age related atrophy.  No ventriculomegaly.  The visualized paranasal sinuses are essentially clear. The mastoid air cells are unopacified.   No evidence of calvarial fracture.  IMPRESSION: No evidence of acute intracranial abnormality.  Mild age related atrophy with small vessel ischemic changes.   Electronically Signed   By: Julian Hy M.D.   On: 05/09/2015 17:04    EKG: AV sequential paced rhythm  Echo: perfusion imaging 03/2015 No reversible or irreversible perfusion defects  Cardiac cath: mid obs disease   Medical decision making:  Discussed care with the patient Discussed care with the physician on the phone Reviewed labs and imaging personally Reviewed prior records  ASSESSMENT AND PLAN:  This is a 81 y.o. female with mild obs CAD w/recent negative stress test with atypical chest pain and back pain, presented with right hand numbness as well as focal tenderness of breast which was relieved by removing bra  Chest pain Non  cardiac in origin Negative recent stress test, puts her at very low risk of CV mortality for the next year - Xray/MR thoracic spine to evaluate for vertebral tenderness - possible fat necrosis of breast vs. localized infection vs. Malignancy; f/u outpatient w/ PCP - cycle troponin, if negative can rule out  Chronic diastolic heart failure However there is elevated creatinine, patient does not seem to be volume overloaded and is at her dry weight 134-136 lbs Consider gentle oral hydration Hold diuresis today and re-evaluate patient tomorrow Check urine sodium  Acute kidney injury Likely secondary to metolazone and low BP Check urine sodium, urine urea, urine creatinine Hold diuretics tonight Restart tomorrow Strict I/Os  Signed, Flossie Dibble, MD MS 05/09/2015, 8:13 PM

## 2015-05-09 NOTE — ED Notes (Signed)
Attempted to call report, bed recently reassigned. Will attempt report again. Lunch provided.

## 2015-05-09 NOTE — ED Notes (Signed)
Pt to xray

## 2015-05-10 DIAGNOSIS — D638 Anemia in other chronic diseases classified elsewhere: Secondary | ICD-10-CM

## 2015-05-10 DIAGNOSIS — R0782 Intercostal pain: Secondary | ICD-10-CM

## 2015-05-10 DIAGNOSIS — I4891 Unspecified atrial fibrillation: Secondary | ICD-10-CM

## 2015-05-10 LAB — BASIC METABOLIC PANEL
ANION GAP: 10 (ref 5–15)
BUN: 40 mg/dL — ABNORMAL HIGH (ref 6–20)
CO2: 36 mmol/L — ABNORMAL HIGH (ref 22–32)
CREATININE: 2.29 mg/dL — AB (ref 0.44–1.00)
Calcium: 9.8 mg/dL (ref 8.9–10.3)
Chloride: 94 mmol/L — ABNORMAL LOW (ref 101–111)
GFR calc Af Amer: 21 mL/min — ABNORMAL LOW (ref >60)
GFR calc non Af Amer: 18 mL/min — ABNORMAL LOW (ref >60)
Glucose, Bld: 103 mg/dL — ABNORMAL HIGH (ref 65–99)
Potassium: 3.1 mmol/L — ABNORMAL LOW (ref 3.5–5.1)
Sodium: 140 mmol/L (ref 135–145)

## 2015-05-10 LAB — CBC
HCT: 40.9 % (ref 36.0–46.0)
HEMOGLOBIN: 13.4 g/dL (ref 12.0–15.0)
MCH: 31.3 pg (ref 26.0–34.0)
MCHC: 32.8 g/dL (ref 30.0–36.0)
MCV: 95.6 fL (ref 78.0–100.0)
PLATELETS: 134 10*3/uL — AB (ref 150–400)
RBC: 4.28 MIL/uL (ref 3.87–5.11)
RDW: 12.9 % (ref 11.5–15.5)
WBC: 4.1 10*3/uL (ref 4.0–10.5)

## 2015-05-10 LAB — TROPONIN I
TROPONIN I: 0.06 ng/mL — AB (ref <0.031)
Troponin I: 0.06 ng/mL — ABNORMAL HIGH (ref <0.031)

## 2015-05-10 MED ORDER — TORSEMIDE 20 MG PO TABS: 30.0000 mg | ORAL_TABLET | Freq: Every day | ORAL | Status: DC

## 2015-05-10 MED ORDER — POTASSIUM CHLORIDE CRYS ER 20 MEQ PO TBCR
60.0000 meq | EXTENDED_RELEASE_TABLET | Freq: Once | ORAL | Status: AC
  Administered 2015-05-10: 60 meq via ORAL
  Filled 2015-05-10: qty 3

## 2015-05-10 NOTE — Discharge Summary (Signed)
Physician Discharge Summary  [name] MEQ:[id number] DOB: 02/03/1925 DOA: 05/09/2015  PCP: [name], MD  Admit date: 05/09/2015 Discharge date: 05/10/2015  Time spent: 40 minutes  Recommendations for Outpatient Follow-up:  1. Follow-up with primary care physician in one week. 2. Demadex decreased to 30 mg daily because of overall worsening creatinine.  Discharge Diagnoses:  Principal Problem:   Chest pain Active Problems:   Hyperlipidemia   HYPERTENSION, BENIGN   Hypothyroidism   Atrial fibrillation   Chronic combined systolic and diastolic CHF (congestive heart failure)   Chronic anticoagulation   Pacemaker   Iron deficiency anemia   Elevated troponin I level   CKD (chronic kidney disease) stage 4, GFR 15-29 ml/min   Anemia of chronic disease   Dyslipidemia   Hypokalemia   Discharge Condition: Stable  Diet recommendation: Heart healthy  Filed Weights   05/09/15 1533 05/09/15 2057 05/10/15 0500  Weight: 61.462 kg (135 lb 8 oz) 61.735 kg (136 lb 1.6 oz) 60.646 kg (133 lb 11.2 oz)    History of present illness:  81 year old female with past medical history of chronic diastolic CHF, HTN, a-fib on Eliquis, last cardiac cath 01/29/2014 which demonstrated mild CAD, pacemaker for sick sinus syndrome and a fib, hypothyroidism, dyslipidemia who presented to [facility] ED with main concern of sudden onset of sharp, left side chest pain started earlier this am prior to this admission. Per patient, pain is located in left lower chest area (almost upper epigastric area), started at rest, reproducible, at worst 6/10 in intensity and associated with right hand pain and numbness. Chest pain resolved spontaneously but does hurt when pt presses on the above mentioned location. Pain and numbness in the hand have completely resolved. No fevers, no cough, no shortness of breath. No palpitations. No abdominal pain, nausea or vomiting. No lightheadedness or loss of consciousness. No leg  swelling. No blood in stool or urine.   In ED, BP was 96/40 but has improved to 136/56. Her 12 lead EKG showed paced rhythm. CT head showed no acute intracranial abnormalities. CXR showed no acute cardiopulmonary findings. Blood work showed platelets of 136, potassium 3.4, creatinine 2.03 and mild elevation in troponin at 0.05. Cardiology has seen the pt in consultation.   Hospital Course:   Chest pain - Most likely non cardiac etiology, as mentioned in HPI her pain seems to be located in almost epigastric area - Cardio has seen the pt in consultation and also believes non cardiac etiology of chest pain - 3 sets of cardiac enzymes were slightly positive at 0.06, flat trend not consistent with ACS. - Had stress test in May 2016 which was negative. -Per cardiology unlikely to be chest pain recommended discharge.  Active Problems:  Chronic combined systolic and diastolic CHF (congestive heart failure) / Pacemaker - Compensated  - Her dry weight is 134 lbs - 136 lbs, at baseline - Resume torsemide    Hyperlipidemia - Continue statin therapy    HYPERTENSION, BENIGN - Continue torsemide   Hypothyroidism - Continue synthroid   Atrial fibrillation / Chronic anticoagulation - CHADS2 vasc score at least 3 - On Eliquis - Rate controlled with amiodarone    Iron deficiency anemia / Anemia of chronic kidney disease - Hemoglobin stable  - Continue ferrous sulfate    Elevated troponin I level - Likely demand ischemia from CKD - Mild elevation at 0.05 - Cycling cardiac enzymes - Recent stress test negative   CKD (chronic kidney disease) stage 4, GFR 15-29  ml/min - Cr above baseline at 2.29. In May creatinine was about 1.621 7. -I decreased the Demadex to 30 mg (1.5 tablet) daily, follow-up with cardiology.   Hypokalemia - From torsemide - Supplemented orally.    Procedures:  None  Consultations:  None  Discharge Exam: Filed Vitals:   05/10/15 1004  BP:  96/56  Pulse: 70  Temp: 97.7 F (36.5 C)  Resp: 18   General: Alert and awake, oriented x3, not in any acute distress. HEENT: anicteric sclera, pupils reactive to light and accommodation, EOMI CVS: S1-S2 clear, no murmur rubs or gallops Chest: clear to auscultation bilaterally, no wheezing, rales or rhonchi Abdomen: soft nontender, nondistended, normal bowel sounds, no organomegaly Extremities: no cyanosis, clubbing or edema noted bilaterally Neuro: Cranial nerves II-XII intact, no focal neurological deficits  Discharge Instructions   Discharge Instructions    Diet - low sodium heart healthy    Complete by:  As directed      Increase activity slowly    Complete by:  As directed           Current Discharge Medication List    CONTINUE these medications which have CHANGED   Details  torsemide (DEMADEX) 20 MG tablet Take 1.5 tablets (30 mg total) by mouth daily. Qty: 180 tablet, Refills: 3      CONTINUE these medications which have NOT CHANGED   Details  acetaminophen (TYLENOL) 325 MG tablet Take 2 tablets (650 mg total) by mouth every 4 (four) hours as needed for headache or mild pain.    amiodarone (PACERONE) 200 MG tablet Take 1 tablet (200 mg total) by mouth daily. Qty: 30 tablet, Refills: 6    apixaban (ELIQUIS) 2.5 MG TABS tablet Take 1 tablet (2.5 mg total) by mouth 2 (two) times daily. Qty: 60 tablet, Refills: 6    ferrous sulfate 325 (65 FE) MG tablet Take 1 tablet (325 mg total) by mouth 2 (two) times daily with a meal. Qty: 180 tablet, Refills: 1    levothyroxine (SYNTHROID, LEVOTHROID) 75 MCG tablet Take 1 tablet (75 mcg total) by mouth daily. Qty: 90 tablet, Refills: 1    loratadine (CLARITIN) 10 MG tablet Take 10 mg by mouth at bedtime.     metolazone (ZAROXOLYN) 2.5 MG tablet Take 1 tablet (2.5 mg total) by mouth as needed (for weight 136 lb or greater). Qty: 10 tablet, Refills: 3    nitroGLYCERIN (NITROSTAT) 0.4 MG SL tablet Place 1 tablet (0.4 mg  total) under the tongue every 5 (five) minutes as needed. For chest pain Qty: 25 tablet, Refills: 6    pantoprazole (PROTONIX) 40 MG tablet TAKE ONE TABLET BY MOUTH ONCE DAILY Qty: 30 tablet, Refills: 4    potassium chloride SA (KLOR-CON M20) 20 MEQ tablet Take 1 tablet (20 mEq total) by mouth 3 (three) times daily. Please take 20 meq three times daily. Qty: 90 tablet, Refills: 3    simvastatin (ZOCOR) 10 MG tablet Take 1 tablet (10 mg total) by mouth at bedtime. Qty: 30 tablet, Refills: 6       Allergies  Allergen Reactions  . Other Shortness Of Breath    Detergents, perfumes and other strong odor emitting compounds  . Tramadol Nausea Only   Follow-up Information    Follow up with [name], MD In 1 week.   Specialties:  Internal Medicine, Pediatrics   Contact information:   [place] [id number] [phone]        The results of  significant diagnostics from this hospitalization (including imaging, microbiology, ancillary and laboratory) are listed below for reference.    Significant Diagnostic Studies: Dg Chest 2 View  05/09/2015   CLINICAL DATA:  Intermittent right and left-sided chest pain  EXAM: CHEST  2 VIEW  COMPARISON:  5/16/ 16  FINDINGS: Right chest wall pacer device is noted with leads in the right atrial appendage and right ventricle. The heart size appears normal. There is no pleural effusion or edema identified. No airspace consolidation. Aortic atherosclerosis noted.  IMPRESSION: 1. No acute cardiopulmonary abnormalities. 2. Aortic atherosclerosis.   Electronically Signed   By: Kerby Moors M.D.   On: 05/09/2015 16:46   Ct Head Wo Contrast  05/09/2015   CLINICAL DATA:  Right hand pain/ numbness, neck/posterior head pain  EXAM: CT HEAD WITHOUT CONTRAST  TECHNIQUE: Contiguous axial images were obtained from the base of the skull through the vertex without intravenous contrast.  COMPARISON:  02/10/2015  FINDINGS: No evidence of  parenchymal hemorrhage or extra-axial fluid collection. No mass lesion, mass effect, or midline shift.  No CT evidence of acute infarction.  Mild subcortical white matter and periventricular small vessel ischemic changes. Intracranial atherosclerosis.  Age related atrophy.  No ventriculomegaly.  The visualized paranasal sinuses are essentially clear. The mastoid air cells are unopacified.  No evidence of calvarial fracture.  IMPRESSION: No evidence of acute intracranial abnormality.  Mild age related atrophy with small vessel ischemic changes.   Electronically Signed   By: Julian Hy M.D.   On: 05/09/2015 17:04    Microbiology: Recent Results (from the past 240 hour(s))  Urine culture     Status: None (Preliminary result)   Collection Time: 05/09/15  3:32 PM  Result Value Ref Range Status   Specimen Description URINE, CLEAN CATCH  Final   Special Requests Normal  Final   Culture NO GROWTH < 24 HOURS  Final   Report Status PENDING  Incomplete     Labs: Basic Metabolic Panel:  Recent Labs Lab 05/09/15 1529 05/10/15 0805  NA 140 140  K 3.4* 3.1*  CL 98* 94*  CO2 29 36*  GLUCOSE 95 103*  BUN 40* 40*  CREATININE 2.03* 2.29*  CALCIUM 9.4 9.8   Liver Function Tests: No results for input(s): AST, ALT, ALKPHOS, BILITOT, PROT, ALBUMIN in the last 168 hours. No results for input(s): LIPASE, AMYLASE in the last 168 hours. No results for input(s): AMMONIA in the last 168 hours. CBC:  Recent Labs Lab 05/09/15 1529 05/10/15 0805  WBC 4.3 4.1  NEUTROABS 3.0  --   HGB 13.1 13.4  HCT 39.3 40.9  MCV 94.0 95.6  PLT 136* 134*   Cardiac Enzymes:  Recent Labs Lab 05/09/15 1529 05/09/15 2200 05/10/15 0130 05/10/15 0805  TROPONINI 0.05* 0.06* 0.06* 0.06*   BNP: BNP (last 3 results)  Recent Labs  02/13/15 0530 03/31/15 0142 05/09/15 1529  BNP 377.9* 442.4* 294.6*    ProBNP (last 3 results)  Recent Labs  08/27/14 1147 09/18/14 1317 10/25/14 0955  PROBNP 914.0*  1874.0* 13191.0*    CBG: No results for input(s): GLUCAP in the last 168 hours.     Signed:  Dennise Bamber A  [facility] 05/10/2015, 12:14 PM

## 2015-05-10 NOTE — Progress Notes (Signed)
Patient ID: [name], female   DOB: 26-Apr-1925, 81 y.o.   MRN: [id number]     Subjective:    NAD  Objective:   Temp:  [97.7 F (36.5 C)-98.5 F (36.9 C)] 97.7 F (36.5 C) (06/26 1004) Pulse Rate:  [68-78] 70 (06/26 1004) Resp:  [15-22] 18 (06/26 1004) BP: (96-136)/(40-93) 96/56 mmHg (06/26 1004) SpO2:  [95 %-100 %] 96 % (06/26 1004) Weight:  [133 lb 11.2 oz (60.646 kg)-136 lb 1.6 oz (61.735 kg)] 133 lb 11.2 oz (60.646 kg) (06/26 0500) Last BM Date: 05/08/15  Filed Weights   05/09/15 1533 05/09/15 2057 05/10/15 0500  Weight: 135 lb 8 oz (61.462 kg) 136 lb 1.6 oz (61.735 kg) 133 lb 11.2 oz (60.646 kg)    Intake/Output Summary (Last 24 hours) at 05/10/15 1040 Last data filed at 05/10/15 1000  Gross per 24 hour  Intake    660 ml  Output    900 ml  Net   -240 ml    Telemetry:AV paced  Exam:  General: NAD  Resp: CTAB  Cardiac: RRR, no m/r/g, no JVD  GI: abdomen soft, NT, ND  MSK: no LE edema  Neuro: no focal deficits  Psych: appropriate affect  Lab Results:  Basic Metabolic Panel:  Recent Labs Lab 05/09/15 1529 05/10/15 0805  NA 140 140  K 3.4* 3.1*  CL 98* 94*  CO2 29 36*  GLUCOSE 95 103*  BUN 40* 40*  CREATININE 2.03* 2.29*  CALCIUM 9.4 9.8    Liver Function Tests: No results for input(s): AST, ALT, ALKPHOS, BILITOT, PROT, ALBUMIN in the last 168 hours.  CBC:  Recent Labs Lab 05/09/15 1529 05/10/15 0805  WBC 4.3 4.1  HGB 13.1 13.4  HCT 39.3 40.9  MCV 94.0 95.6  PLT 136* 134*    Cardiac Enzymes:  Recent Labs Lab 05/09/15 2200 05/10/15 0130 05/10/15 0805  TROPONINI 0.06* 0.06* 0.06*    BNP:  Recent Labs  08/27/14 1147 09/18/14 1317 10/25/14 0955  PROBNP 914.0* 1874.0* 13191.0*    Coagulation: No results for input(s): INR in the last 168 hours.  ECG:   Medications:   Scheduled Medications: . amiodarone  200 mg Oral Daily  . apixaban  2.5 mg Oral BID  . ferrous sulfate  325 mg Oral BID WC  .  levothyroxine  75 mcg Oral QAC breakfast  . loratadine  10 mg Oral QHS  . pantoprazole  40 mg Oral Daily  . simvastatin  10 mg Oral QHS  . sodium chloride  3 mL Intravenous Q12H  . torsemide  40 mg Oral Daily     Infusions:     PRN Medications:  acetaminophen, metolazone, ondansetron **OR** ondansetron (ZOFRAN) IV     Assessment/Plan    1. Chest pain - atypical chest pain - negative nuclear stress test 03/2015 03/2015 echo LVEF 45-50%, mid inferoseptal and apical septal walls. Grade II diastolic dysfunction. Mild to moderate mitral stenosis though limited evaluation. - mild trop 0.06 that is flat, EKG is AV paced. Lookging back her trop is always 0.06 to 0.07   2. Chronic diastolic heart failure - appears euvolemic, at dry weight 134-136 lbs - admitted with elevation in Cr, diuretics on hold. Baseline Cr is variable at 1.9-2.3 - restarted torsemide today   No further cardiac testing planned. Will sign off inpatient care.      Carlyle Dolly, M.D

## 2015-05-10 NOTE — Progress Notes (Signed)
Pt discharge education and instructions completed with pt and daughter at bedside; both voices understanding and denies any questions. Pt IV and telemetry removed; pt discharge home with daughter to transport her home. Pt transported off unit via wheelchair with belongings and daughter at side. Francis Gaines Julian Askin RN.

## 2015-05-11 ENCOUNTER — Telehealth: Payer: Self-pay | Admitting: *Deleted

## 2015-05-11 LAB — URINE CULTURE: Special Requests: NORMAL

## 2015-05-11 NOTE — Telephone Encounter (Signed)
Transition Care Management Follow-up Telephone Call  How have you been since you were released from the hospital? Still  Has some numbness with right hand   Do you understand why you were in the hospital? yes   Do you understand the discharge instructions? yes  Items Reviewed:  Medications reviewed: yes.  Patient is taking  her potassium 3 tabs in the morning.  Allergies reviewed: yes  Dietary changes reviewed: yes -heart healthy and low sodium  Referrals reviewed: yes - patient is waiting for a scale, pulse oxygen and blood pressure monitor to be delivered by her insurance.   Functional Questionnaire:   Activities of Daily Living (ADLs):   She states they are independent in the following: ambulation, bathing and hygiene, feeding, continence, grooming, toileting, dressing and daily activities States they require assistance with the following: none needed   Any transportation issues/concerns?: no   Any patient concerns? no   Confirmed importance and date/time of follow-up visits scheduled: yes   Confirmed with patient if condition begins to worsen call PCP or go to the ER.  Patient was given the Call-a-Nurse line [phone]: yes

## 2015-05-13 NOTE — Progress Notes (Signed)
RETRO REVIEW COMPLETED. 

## 2015-05-15 ENCOUNTER — Other Ambulatory Visit: Payer: Self-pay | Admitting: Family Medicine

## 2015-05-15 NOTE — Telephone Encounter (Signed)
Patient is aware to call cardiology for clarification on directions of potassium.

## 2015-05-19 ENCOUNTER — Encounter: Payer: Self-pay | Admitting: Internal Medicine

## 2015-05-19 ENCOUNTER — Ambulatory Visit (INDEPENDENT_AMBULATORY_CARE_PROVIDER_SITE_OTHER): Payer: Medicare Other | Admitting: Internal Medicine

## 2015-05-19 VITALS — BP 140/72 | Temp 98.5°F | Ht <= 58 in | Wt 137.9 lb

## 2015-05-19 DIAGNOSIS — R208 Other disturbances of skin sensation: Secondary | ICD-10-CM | POA: Diagnosis not present

## 2015-05-19 DIAGNOSIS — R200 Anesthesia of skin: Secondary | ICD-10-CM

## 2015-05-19 DIAGNOSIS — H01009 Unspecified blepharitis unspecified eye, unspecified eyelid: Secondary | ICD-10-CM

## 2015-05-19 DIAGNOSIS — E039 Hypothyroidism, unspecified: Secondary | ICD-10-CM | POA: Diagnosis not present

## 2015-05-19 DIAGNOSIS — N183 Chronic kidney disease, stage 3 unspecified: Secondary | ICD-10-CM

## 2015-05-19 DIAGNOSIS — I10 Essential (primary) hypertension: Secondary | ICD-10-CM

## 2015-05-19 DIAGNOSIS — Z09 Encounter for follow-up examination after completed treatment for conditions other than malignant neoplasm: Secondary | ICD-10-CM

## 2015-05-19 MED ORDER — ERYTHROMYCIN 5 MG/GM OP OINT: 1.0000 "application " | TOPICAL_OINTMENT | Freq: Two times a day (BID) | OPHTHALMIC | Status: DC

## 2015-05-19 NOTE — Progress Notes (Signed)
Pre visit review using our clinic review tool, if applicable. No additional management support is needed unless otherwise documented below in the visit note.  Chief Complaint  Patient presents with  . Hospitalization Follow-up    HPI: Patient comes in today as follow up from hospitalization/  For  Cp fel;t to be atypical and not iscemic and numbness tingling in arm hand only at night with pain  Last   A while and them better no weakness  She is CHF clinic    next cards appt soon. Has had 5 hops and 2 ed visits in the last 6 months  All related to cv symptoms  Has had re d eye lids and discharge and rx with antibiotic topical by cardiology   Old eye doc   NA hasn't had eye exam in a while . No vision change   Sees podiatry about thickened nails  On blood thinner  Hard to do correctly.vv hard to get stockings off and on  No pain  Or ulcer ation now.   ROS: See pertinent positives and negatives per HPI. resp satus no change no inc edema no bleeding  No cough fall living alone daughter checks on her  Past Medical History  Diagnosis Date  . Dyslipidemia   . Hypertension   . Palpitations     PVCs/bigeminy on event in May 2009 revealing this was relatively asymptomatic  . Chest pain     a. 2011 Neg MV;  b. 01/2014 Cath: LM 20-30, LAD nl, D1 nl, LCX nl, OM1 nl, RCA dom 30-52m, PD/PL nl, EF 65%->Med Rx.  . Degenerative joint disease   . Hx of varicella   . History of skin cancer     tafeen/ non melanoma.   . Thrombocytopenia   . Anemia   . Monoclonal gammopathies   . Right lower quadrant abdominal pain 07/19/2012    Recurrent  With nausea    This is new since she had ct for llq pain in [facility]    R/o appendiceal problem  Hernia  Get ct scan  And plan  Fu     . Diastolic CHF, acute on chronic 01/31/2012  . Atrial fibrillation     a. Dx 01/2014->Eliquis started.  . Cancer   . Pacemaker   . Dysrhythmia   . Family history of adverse reaction to anesthesia     " multiple family members have  difficulty waking "  . GERD (gastroesophageal reflux disease)   . Hypothyroidism     Family History  Problem Relation Age of Onset  . Coronary artery disease Father   . Heart disease Father   . Lung cancer    . Alzheimer's disease Mother   . Cancer Brother 81    lung cancer  . Heart attack Father     History   Social History  . Marital Status: Widowed    Spouse Name: N/A  . Number of Children: 3  . Years of Education: N/A   Occupational History  .      [employer], book keeping   Social History Main Topics  . Smoking status: Never Smoker   . Smokeless tobacco: Never Used  . Alcohol Use: No  . Drug Use: No  . Sexual Activity: No   Other Topics Concern  . 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 Eats with her.      Daughter handles medications. Sees HH and PT once a week.    Outpatient Prescriptions Prior to Visit  Medication Sig Dispense Refill  . acetaminophen (TYLENOL) 325 MG tablet Take 2 tablets (650 mg total) by mouth every 4 (four) hours as needed for headache or mild pain.    Marland Kitchen amiodarone (PACERONE) 200 MG tablet Take 1 tablet (200 mg total) by mouth daily. 30 tablet 6  . apixaban (ELIQUIS) 2.5 MG TABS tablet Take 1 tablet (2.5 mg total) by mouth 2 (two) times daily. 60 tablet 6  . ferrous sulfate 325 (65 FE) MG tablet Take 1 tablet (325 mg total) by mouth 2 (two) times daily with a meal. 180 tablet 1  . levothyroxine (SYNTHROID, LEVOTHROID) 75 MCG tablet Take 1 tablet (75 mcg total) by mouth daily. 90 tablet 1  . loratadine (CLARITIN) 10 MG tablet Take 10 mg by mouth at bedtime.     . metolazone (ZAROXOLYN) 2.5 MG tablet Take 1 tablet (2.5 mg total) by mouth as needed (for weight 136 lb or greater). 10 tablet 3  . nitroGLYCERIN  (NITROSTAT) 0.4 MG SL tablet Place 1 tablet (0.4 mg total) under the tongue every 5 (five) minutes as needed. For chest pain 25 tablet 6  . pantoprazole (PROTONIX) 40 MG tablet TAKE ONE TABLET BY MOUTH ONCE DAILY 30 tablet 4  . potassium chloride SA (KLOR-CON M20) 20 MEQ tablet Take 1 tablet (20 mEq total) by mouth 3 (three) times daily. Please take 20 meq three times daily. (Patient taking differently: Take 60 mEq by mouth every morning. ) 90 tablet 3  . simvastatin (ZOCOR) 10 MG tablet Take 1 tablet (10 mg total) by mouth at bedtime. 30 tablet 6  . torsemide (DEMADEX) 20 MG tablet Take 1.5 tablets (30 mg total) by mouth daily. (Patient taking differently: Take 40 mg by mouth daily. ) 180 tablet 3   No facility-administered medications prior to visit.     EXAM:  BP 140/72 mmHg  Temp(Src) 98.5 F (36.9 C) (Oral)  Ht 4\' 10"  (1.473 m)  Wt 137 lb 14.4 oz (62.551 kg)  BMI 28.83 kg/m2  Body mass index is 28.83 kg/(m^2).  GENERAL: vitals reviewed and listed above, alert, oriented, appears well hydrated and in no acute distress ob bilateral perilid redness and some crusting no edema no lesion otherwise  Large area around eyes HEENT: atraumatic, conjunctiva  clearto pink scalin areas face , no obvious abnormalities on inspection of external nose and ears OP : no lesion edema or exudate  NECK: no obvious masses on inspection palpation  LUNGS: clear to auscultation bilaterally, no wheezes, rales or rhonchi,  Seems = BS  CV: HIRRR, no clubbing cyanosis or    Has VV no ulcerations or redness nl cap refill  MS: moves all extremities  oa and sausage digit changes in hand  Right more than left nl pulse no atrophy  PSYCH: pleasant and cooperative, no obvious depression or anxiety Lab Results  Component Value Date   WBC 4.1 05/10/2015   HGB 13.4 05/10/2015   HCT 40.9 05/10/2015   PLT 134* 05/10/2015   GLUCOSE 103* 05/10/2015   CHOL 141 04/14/2014   TRIG 56 04/14/2014   HDL 89 04/14/2014    LDLCALC 41 04/14/2014   ALT  18 03/31/2015   AST 20 03/31/2015   NA 140 05/10/2015   K 3.1* 05/10/2015   CL 94* 05/10/2015   CREATININE 2.29* 05/10/2015   BUN 40* 05/10/2015   CO2 36* 05/10/2015   TSH 1.701 05/09/2015   INR 1.28 03/31/2015    ASSESSMENT AND PLAN:  Discussed the following assessment and plan:  Hand numbness - mostly right nocturnal poss  cts .  try splints at night  and if continueing see hand person.   Blepharitis, unspecified laterality - bilateral e ointmnet for now  ho see eye doc   Chronic renal disease, stage III  Hypothyroidism, unspecified hypothyroidism type  Essential hypertension  Hospital discharge follow-up Cardiology advised to follow  Her electrolytes and fu with then as they are intricately involved with her med  Fluid management  For hre chf  .  Otherwise  Follow  Other issues  -Patient advised to return or notify health care team  if symptoms worsen ,persist or new concerns arise. Nails  : Patient Instructions   Blepharitis   Inflammation of the eye lids  Can come and go  Advise see  ophthalmologist   Also . To help with management . Your wrist  Numbness could be   Carpal tunnel.   And try short wrist splints  at night   If  persistent or progressive we can get the hand specialist to see you.   Have cardiology  Manage the fluid potassium meds and evaluations.   Blepharitis Blepharitis is redness, soreness, and swelling (inflammation) of one or both eyelids. It may be caused by an allergic reaction or a bacterial infection. Blepharitis may also be associated with reddened, scaly skin (seborrhea) of the scalp and eyebrows. While you sleep, eye discharge may cause your eyelashes to stick together. Your eyelids may itch, burn, swell, and may lose their lashes. These will grow back. Your eyes may become sensitive. Blepharitis may recur and need repeated treatment. If this is the case, you may require further evaluation by an eye specialist  (ophthalmologist). HOME CARE INSTRUCTIONS   Keep your hands clean.  Use a clean towel each time you dry your eyelids. Do not use this towel to clean other areas. Do not share a towel or makeup with anyone.  Wash your eyelids with warm water or warm water mixed with a small amount of baby shampoo. Do this twice a day or as often as needed.  Wash your face and eyebrows at least once a day.  Use warm compresses 2 times a day for 10 minutes at a time, or as directed by your caregiver.  Apply antibiotic ointment as directed by your caregiver.  Avoid rubbing your eyes.  Avoid wearing makeup until you get better.  Follow up with your caregiver as directed. SEEK IMMEDIATE MEDICAL CARE IF:   You have pain, redness, or swelling that gets worse or spreads to other parts of your face.  Your vision changes, or you have pain when looking at lights or moving objects.  You have a fever.  Your symptoms continue for longer than 2 to 4 days or become worse. MAKE SURE YOU:   Understand these instructions.  Will watch your condition.  Will get help right away if you are not doing well or get worse. Document Released: 10/28/2000 Document Revised: 01/23/2012 Document Reviewed: 12/08/2010 [facility] Patient Information 2015 [place], [place]. This information is not intended to replace advice given to you by your health care provider. Make sure you discuss any  questions you have with your health care provider.   Carpal Tunnel Syndrome Carpal tunnel syndrome is a disorder of the nervous system in the wrist that causes pain, hand weakness, and/or loss of feeling. Carpal tunnel syndrome is caused by the compression, stretching, or irritation of the median nerve at the wrist joint. Athletes who experience carpal tunnel syndrome may notice a decrease in their performance to the condition, especially for sports that require strong hand or wrist action.  SYMPTOMS   Tingling, numbness, or burning pain in the  hand or fingers.  Inability to sleep due to pain in the hand.  Sharp pains that shoot from the wrist up the arm or to the fingers, especially at night.  Morning stiffness or cramping of the hand.  Thumb weakness, resulting in difficulty holding objects or making a fist.  Shiny, dry skin on the hand.  Reduced performance in any sport requiring a strong grip. CAUSES   Median nerve damage at the wrist is caused by pressure due to swelling, inflammation, or scarred tissue.  Sources of pressure include:  Repetitive gripping or squeezing that causes inflammation of the tendon sheaths.  Scarring or shortening of the ligament that covers the median nerve.  Traumatic injury to the wrist or forearm such as fracture, sprain, or dislocation.  Prolonged hyperextension (wrist bent backward) or hyperflexion (wrist bent downward) of the wrist. RISK INCREASES WITH:  Diabetes mellitus.  Menopause or amenorrhea.  Rheumatoid arthritis.  Raynaud disease.  Pregnancy.  Gout.  Kidney disease.  Ganglion cyst.  Repetitive hand or wrist action.  Hypothyroidism (underactive thyroid gland).  Repetitive jolting or shaking of the hands or wrist.  Prolonged forceful weight-bearing on the hands. PREVENTION  Bracing the hand and wrist straight during activities that involve repetitive grasping.  For activities that require prolonged extension of the wrist (bending towards the top of the forearm) periodically change the position of your wrists.  Learn and use proper technique in activities that result in the wrist position in neutral to slight extension.  Avoid bending the wrist into full extension or flexion (up or down).  Keep the wrist in a straight (neutral) position. To keep the wrist in this position, wear a splint.  Avoid repetitive hand and wrist motions.  When possible avoid prolonged grasping of items (steering wheel of a car, a pen, a vacuum cleaner, or a rake).  Loosen  your grip for activities that require prolonged grasping of items.  Place keyboards and writing surfaces at the correct height as to decrease strain on the wrist and hand.  Alternate work tasks to avoid prolonged wrist flexion.  Avoid pinching activities (needlework and writing) as they may irritate your carpal tunnel syndrome.  If these activities are necessary, complete them for shorter periods of time.  When writing, use a felt tip or rollerball pen and/or build up the grip on a pen to decrease the forces required for writing. PROGNOSIS  Carpal tunnel syndrome is usually curable with appropriate conservative treatment and sometimes resolves spontaneously. For some cases, surgery is necessary, especially if muscle wasting or nerve changes have developed.  RELATED COMPLICATIONS   Permanent numbness and a weak thumb or fingers in the affected hand.  Permanent paralysis of a portion of the hand and fingers. TREATMENT  Treatment initially consists of stopping activities that aggravate the symptoms as well as medication and ice to reduce inflammation. A wrist splint is often recommended for wear during activities of repetitive motion as well as at night.  It is also important to learn and use proper technique when performing activities that typically cause pain. On occasion, a corticosteroid injection may be given. If symptoms persist despite conservative treatment, surgery may be an option. Surgical techniques free the pinched or compressed nerve. Carpal tunnel surgery is usually performed on an outpatient basis, meaning you go home the same day as surgery. These procedures provide almost complete relief of all symptoms in 95% of patients. Expect at least 2 weeks for healing after surgery. For cases that are the result of repeated jolting or shaking of the hand or wrist or prolonged hyperextension, surgery is not usually recommended because stretching of the median nerve, not compression, is usually  the cause of carpal tunnel syndrome in these cases. MEDICATION   If pain medication is necessary, nonsteroidal anti-inflammatory medications, such as aspirin and ibuprofen, or other minor pain relievers, such as acetaminophen, are often recommended.  Do not take pain medication for 7 days before surgery.  Prescription pain relievers are usually only prescribed after surgery. Use only as directed and only as much as you need.  Corticosteroid injections may be given to reduce inflammation. However, they are not always recommended.  Vitamin B6 (pyridoxine) may reduce symptoms; use only if prescribed for your disorder. SEEK MEDICAL CARE IF:   Symptoms get worse or do not improve in 2 weeks despite treatment.  You also have a current or recent history of neck or shoulder injury that has resulted in pain or tingling elsewhere in your arm. Document Released: 10/31/2005 Document Revised: 03/17/2014 Document Reviewed: 02/12/2009 [facility] Patient Information 2015 [place], [place]. This information is not intended to replace advice given to you by your health care provider. Make sure you discuss any questions you have with your health care provider.      Standley Brooking. Panosh M.D. 1. Chest pain - atypical chest pain - negative nuclear stress test 03/2015 03/2015 echo LVEF 45-50%, mid inferoseptal and apical septal walls. Grade II diastolic dysfunction. Mild to moderate mitral stenosis though limited evaluation. - mild trop 0.06 that is flat, EKG is AV paced. Lookging back her trop is always 0.06 to 0.07   2. Chronic diastolic heart failure - appears euvolemic, at dry weight 134-136 lbs - admitted with elevation in Cr, diuretics on hold. Baseline Cr is variable at 1.9-2.3 - restarted torsemide today   No further cardiac testing planned. Will sign off inpatient care.   Hospital Course:   Chest pain - Most likely non cardiac etiology, as mentioned in HPI her pain seems to be located in almost  epigastric area - Cardio has seen the pt in consultation and also believes non cardiac etiology of chest pain - 3 sets of cardiac enzymes were slightly positive at 0.06, flat trend not consistent with ACS. - Had stress test in May 2016 which was negative. -Per cardiology unlikely to be chest pain recommended discharge.  Active Problems:  Chronic combined systolic and diastolic CHF (congestive heart failure) / Pacemaker - Compensated  - Her dry weight is 134 lbs - 136 lbs, at baseline - Resume torsemide    Hyperlipidemia - Continue statin therapy    HYPERTENSION, BENIGN - Continue torsemide   Hypothyroidism - Continue synthroid   Atrial fibrillation / Chronic anticoagulation - CHADS2 vasc score at least 3 - On Eliquis - Rate controlled with amiodarone    Iron deficiency anemia / Anemia of chronic kidney disease - Hemoglobin stable  - Continue ferrous sulfate    Elevated troponin I level -  Likely demand ischemia from CKD - Mild elevation at 0.05 - Cycling cardiac enzymes - Recent stress test negative   CKD (chronic kidney disease) stage 4, GFR 15-29 ml/min - Cr above baseline at 2.29. In May creatinine was about 1.621 7. -I decreased the Demadex to 30 mg (1.5 tablet) daily, follow-up with cardiology.   Hypokalemia - From torsemide - Supplemented orally.

## 2015-05-19 NOTE — Patient Instructions (Signed)
Blepharitis   Inflammation of the eye lids  Can come and go  Advise see  ophthalmologist   Also . To help with management . Your wrist  Numbness could be   Carpal tunnel.   And try short wrist splints  at night   If  persistent or progressive we can get the hand specialist to see you.   Have cardiology  Manage the fluid potassium meds and evaluations.   Blepharitis Blepharitis is redness, soreness, and swelling (inflammation) of one or both eyelids. It may be caused by an allergic reaction or a bacterial infection. Blepharitis may also be associated with reddened, scaly skin (seborrhea) of the scalp and eyebrows. While you sleep, eye discharge may cause your eyelashes to stick together. Your eyelids may itch, burn, swell, and may lose their lashes. These will grow back. Your eyes may become sensitive. Blepharitis may recur and need repeated treatment. If this is the case, you may require further evaluation by an eye specialist (ophthalmologist). HOME CARE INSTRUCTIONS   Keep your hands clean.  Use a clean towel each time you dry your eyelids. Do not use this towel to clean other areas. Do not share a towel or makeup with anyone.  Wash your eyelids with warm water or warm water mixed with a small amount of baby shampoo. Do this twice a day or as often as needed.  Wash your face and eyebrows at least once a day.  Use warm compresses 2 times a day for 10 minutes at a time, or as directed by your caregiver.  Apply antibiotic ointment as directed by your caregiver.  Avoid rubbing your eyes.  Avoid wearing makeup until you get better.  Follow up with your caregiver as directed. SEEK IMMEDIATE MEDICAL CARE IF:   You have pain, redness, or swelling that gets worse or spreads to other parts of your face.  Your vision changes, or you have pain when looking at lights or moving objects.  You have a fever.  Your symptoms continue for longer than 2 to 4 days or become worse. MAKE SURE YOU:    Understand these instructions.  Will watch your condition.  Will get help right away if you are not doing well or get worse. Document Released: 10/28/2000 Document Revised: 01/23/2012 Document Reviewed: 12/08/2010 [facility] Patient Information 2015 [place], [place]. This information is not intended to replace advice given to you by your health care provider. Make sure you discuss any questions you have with your health care provider.   Carpal Tunnel Syndrome Carpal tunnel syndrome is a disorder of the nervous system in the wrist that causes pain, hand weakness, and/or loss of feeling. Carpal tunnel syndrome is caused by the compression, stretching, or irritation of the median nerve at the wrist joint. Athletes who experience carpal tunnel syndrome may notice a decrease in their performance to the condition, especially for sports that require strong hand or wrist action.  SYMPTOMS   Tingling, numbness, or burning pain in the hand or fingers.  Inability to sleep due to pain in the hand.  Sharp pains that shoot from the wrist up the arm or to the fingers, especially at night.  Morning stiffness or cramping of the hand.  Thumb weakness, resulting in difficulty holding objects or making a fist.  Shiny, dry skin on the hand.  Reduced performance in any sport requiring a strong grip. CAUSES   Median nerve damage at the wrist is caused by pressure due to swelling, inflammation, or scarred tissue.  Sources of pressure include:  Repetitive gripping or squeezing that causes inflammation of the tendon sheaths.  Scarring or shortening of the ligament that covers the median nerve.  Traumatic injury to the wrist or forearm such as fracture, sprain, or dislocation.  Prolonged hyperextension (wrist bent backward) or hyperflexion (wrist bent downward) of the wrist. RISK INCREASES WITH:  Diabetes mellitus.  Menopause or amenorrhea.  Rheumatoid arthritis.  Raynaud  disease.  Pregnancy.  Gout.  Kidney disease.  Ganglion cyst.  Repetitive hand or wrist action.  Hypothyroidism (underactive thyroid gland).  Repetitive jolting or shaking of the hands or wrist.  Prolonged forceful weight-bearing on the hands. PREVENTION  Bracing the hand and wrist straight during activities that involve repetitive grasping.  For activities that require prolonged extension of the wrist (bending towards the top of the forearm) periodically change the position of your wrists.  Learn and use proper technique in activities that result in the wrist position in neutral to slight extension.  Avoid bending the wrist into full extension or flexion (up or down).  Keep the wrist in a straight (neutral) position. To keep the wrist in this position, wear a splint.  Avoid repetitive hand and wrist motions.  When possible avoid prolonged grasping of items (steering wheel of a car, a pen, a vacuum cleaner, or a rake).  Loosen your grip for activities that require prolonged grasping of items.  Place keyboards and writing surfaces at the correct height as to decrease strain on the wrist and hand.  Alternate work tasks to avoid prolonged wrist flexion.  Avoid pinching activities (needlework and writing) as they may irritate your carpal tunnel syndrome.  If these activities are necessary, complete them for shorter periods of time.  When writing, use a felt tip or rollerball pen and/or build up the grip on a pen to decrease the forces required for writing. PROGNOSIS  Carpal tunnel syndrome is usually curable with appropriate conservative treatment and sometimes resolves spontaneously. For some cases, surgery is necessary, especially if muscle wasting or nerve changes have developed.  RELATED COMPLICATIONS   Permanent numbness and a weak thumb or fingers in the affected hand.  Permanent paralysis of a portion of the hand and fingers. TREATMENT  Treatment initially  consists of stopping activities that aggravate the symptoms as well as medication and ice to reduce inflammation. A wrist splint is often recommended for wear during activities of repetitive motion as well as at night. It is also important to learn and use proper technique when performing activities that typically cause pain. On occasion, a corticosteroid injection may be given. If symptoms persist despite conservative treatment, surgery may be an option. Surgical techniques free the pinched or compressed nerve. Carpal tunnel surgery is usually performed on an outpatient basis, meaning you go home the same day as surgery. These procedures provide almost complete relief of all symptoms in 95% of patients. Expect at least 2 weeks for healing after surgery. For cases that are the result of repeated jolting or shaking of the hand or wrist or prolonged hyperextension, surgery is not usually recommended because stretching of the median nerve, not compression, is usually the cause of carpal tunnel syndrome in these cases. MEDICATION   If pain medication is necessary, nonsteroidal anti-inflammatory medications, such as aspirin and ibuprofen, or other minor pain relievers, such as acetaminophen, are often recommended.  Do not take pain medication for 7 days before surgery.  Prescription pain relievers are usually only prescribed after surgery. Use only as directed  and only as much as you need.  Corticosteroid injections may be given to reduce inflammation. However, they are not always recommended.  Vitamin B6 (pyridoxine) may reduce symptoms; use only if prescribed for your disorder. SEEK MEDICAL CARE IF:   Symptoms get worse or do not improve in 2 weeks despite treatment.  You also have a current or recent history of neck or shoulder injury that has resulted in pain or tingling elsewhere in your arm. Document Released: 10/31/2005 Document Revised: 03/17/2014 Document Reviewed: 02/12/2009 [facility] Patient  Information 2015 [place], [place]. This information is not intended to replace advice given to you by your health care provider. Make sure you discuss any questions you have with your health care provider.

## 2015-05-26 ENCOUNTER — Encounter (HOSPITAL_COMMUNITY): Payer: Self-pay | Admitting: Neurology

## 2015-05-26 ENCOUNTER — Emergency Department (HOSPITAL_COMMUNITY): Payer: Medicare Other

## 2015-05-26 ENCOUNTER — Emergency Department (HOSPITAL_COMMUNITY)
Admission: EM | Admit: 2015-05-26 | Discharge: 2015-05-26 | Disposition: A | Discharge status: Home / Self Care | Payer: Medicare Other | Attending: Emergency Medicine | Admitting: Emergency Medicine

## 2015-05-26 ENCOUNTER — Telehealth: Payer: Self-pay | Admitting: Internal Medicine

## 2015-05-26 DIAGNOSIS — Y9289 Other specified places as the place of occurrence of the external cause: Secondary | ICD-10-CM | POA: Insufficient documentation

## 2015-05-26 DIAGNOSIS — I5033 Acute on chronic diastolic (congestive) heart failure: Secondary | ICD-10-CM | POA: Insufficient documentation

## 2015-05-26 DIAGNOSIS — K219 Gastro-esophageal reflux disease without esophagitis: Secondary | ICD-10-CM | POA: Insufficient documentation

## 2015-05-26 DIAGNOSIS — Z9889 Other specified postprocedural states: Secondary | ICD-10-CM | POA: Diagnosis not present

## 2015-05-26 DIAGNOSIS — Z79899 Other long term (current) drug therapy: Secondary | ICD-10-CM | POA: Diagnosis not present

## 2015-05-26 DIAGNOSIS — Y9389 Activity, other specified: Secondary | ICD-10-CM | POA: Insufficient documentation

## 2015-05-26 DIAGNOSIS — Y998 Other external cause status: Secondary | ICD-10-CM | POA: Diagnosis not present

## 2015-05-26 DIAGNOSIS — E785 Hyperlipidemia, unspecified: Secondary | ICD-10-CM | POA: Diagnosis not present

## 2015-05-26 DIAGNOSIS — W06XXXA Fall from bed, initial encounter: Secondary | ICD-10-CM | POA: Insufficient documentation

## 2015-05-26 DIAGNOSIS — D649 Anemia, unspecified: Secondary | ICD-10-CM | POA: Diagnosis not present

## 2015-05-26 DIAGNOSIS — E039 Hypothyroidism, unspecified: Secondary | ICD-10-CM | POA: Insufficient documentation

## 2015-05-26 DIAGNOSIS — I10 Essential (primary) hypertension: Secondary | ICD-10-CM | POA: Insufficient documentation

## 2015-05-26 DIAGNOSIS — W19XXXA Unspecified fall, initial encounter: Secondary | ICD-10-CM

## 2015-05-26 DIAGNOSIS — Z950 Presence of cardiac pacemaker: Secondary | ICD-10-CM | POA: Diagnosis not present

## 2015-05-26 DIAGNOSIS — Z85828 Personal history of other malignant neoplasm of skin: Secondary | ICD-10-CM | POA: Diagnosis not present

## 2015-05-26 DIAGNOSIS — S0990XA Unspecified injury of head, initial encounter: Secondary | ICD-10-CM | POA: Diagnosis present

## 2015-05-26 NOTE — Discharge Instructions (Signed)
Fall Prevention and Home Safety Keep your follow-up appointment with your primary care physician next week. Falls cause injuries and can affect all age groups. It is possible to use preventive measures to significantly decrease the likelihood of falls. There are many simple measures which can make your home safer and prevent falls. OUTDOORS  Repair cracks and edges of walkways and driveways.  Remove high doorway thresholds.  Trim shrubbery on the main path into your home.  Have good outside lighting.  Clear walkways of tools, rocks, debris, and clutter.  Check that handrails are not broken and are securely fastened. Both sides of steps should have handrails.  Have leaves, snow, and ice cleared regularly.  Use sand or salt on walkways during winter months.  In the garage, clean up grease or oil spills. BATHROOM  Install night lights.  Install grab bars by the toilet and in the tub and shower.  Use non-skid mats or decals in the tub or shower.  Place a plastic non-slip stool in the shower to sit on, if needed.  Keep floors dry and clean up all water on the floor immediately.  Remove soap buildup in the tub or shower on a regular basis.  Secure bath mats with non-slip, double-sided rug tape.  Remove throw rugs and tripping hazards from the floors. BEDROOMS  Install night lights.  Make sure a bedside light is easy to reach.  Do not use oversized bedding.  Keep a telephone by your bedside.  Have a firm chair with side arms to use for getting dressed.  Remove throw rugs and tripping hazards from the floor. KITCHEN  Keep handles on pots and pans turned toward the center of the stove. Use back burners when possible.  Clean up spills quickly and allow time for drying.  Avoid walking on wet floors.  Avoid hot utensils and knives.  Position shelves so they are not too high or low.  Place commonly used objects within easy reach.  If necessary, use a sturdy step  stool with a grab bar when reaching.  Keep electrical cables out of the way.  Do not use floor polish or wax that makes floors slippery. If you must use wax, use non-skid floor wax.  Remove throw rugs and tripping hazards from the floor. STAIRWAYS  Never leave objects on stairs.  Place handrails on both sides of stairways and use them. Fix any loose handrails. Make sure handrails on both sides of the stairways are as long as the stairs.  Check carpeting to make sure it is firmly attached along stairs. Make repairs to worn or loose carpet promptly.  Avoid placing throw rugs at the top or bottom of stairways, or properly secure the rug with carpet tape to prevent slippage. Get rid of throw rugs, if possible.  Have an electrician put in a light switch at the top and bottom of the stairs. OTHER FALL PREVENTION TIPS  Wear low-heel or rubber-soled shoes that are supportive and fit well. Wear closed toe shoes.  When using a stepladder, make sure it is fully opened and both spreaders are firmly locked. Do not climb a closed stepladder.  Add color or contrast paint or tape to grab bars and handrails in your home. Place contrasting color strips on first and last steps.  Learn and use mobility aids as needed. Install an electrical emergency response system.  Turn on lights to avoid dark areas. Replace light bulbs that burn out immediately. Get light switches that glow.  Arrange  furniture to create clear pathways. Keep furniture in the same place.  Firmly attach carpet with non-skid or double-sided tape.  Eliminate uneven floor surfaces.  Select a carpet pattern that does not visually hide the edge of steps.  Be aware of all pets. OTHER HOME SAFETY TIPS  Set the water temperature for 120 F (48.8 C).  Keep emergency numbers on or near the telephone.  Keep smoke detectors on every level of the home and near sleeping areas. Document Released: 10/21/2002 Document Revised: 05/01/2012  Document Reviewed: 01/20/2012 [facility] Patient Information 2015 [place], [place]. This information is not intended to replace advice given to you by your health care provider. Make sure you discuss any questions you have with your health care provider.

## 2015-05-26 NOTE — ED Provider Notes (Signed)
CSN: [id number]     Arrival date & time 05/26/15  1447 History   This chart was scribed for Linda Glazier, PA-C working with [name], MD by Mercy Moore, ED Scribe. This patient was seen in room TR05C/TR05C and the patient's care was started at 5:14 PM.   Chief Complaint  Patient presents with  . Head Injury   The history is provided by the patient and a relative. No language interpreter was used.   HPI Comments: [name] is a 81 y.o. female who presents to the Emergency Department with head and left rib pain resulting from a fall out of bed six days ago. Patient reports falling from the bed when startled from her sleep, landing on carpeted floor and hearing an audible "crack" in her head. Patient denies loss of consciousness. Patient reports ambulating with a cane following the fall explaining that she's been experiencing dizziness and unsteadiness on her feet. Patient reports being evaluated at [facility], four days ago; discovery of contusion to ribs without fracture. Patient was informed that she would have to go to ED for CT scan to evaluate any head injury or hemorrhage. Patient on anticoagulant, Eliquis. Patient has been taking Tylenol at home for pain relief.  Patient shares that she has an upcoming appointment with her PCP, 7/20.   Past Medical History  Diagnosis Date  . Dyslipidemia   . Hypertension   . Palpitations     PVCs/bigeminy on event in May 2009 revealing this was relatively asymptomatic  . Chest pain     a. 2011 Neg MV;  b. 01/2014 Cath: LM 20-30, LAD nl, D1 nl, LCX nl, OM1 nl, RCA dom 30-79m, PD/PL nl, EF 65%->Med Rx.  . Degenerative joint disease   . Hx of varicella   . History of skin cancer     tafeen/ non melanoma.   . Thrombocytopenia   . Anemia   . Monoclonal gammopathies   . Right lower quadrant abdominal pain 07/19/2012    Recurrent  With nausea    This is new since she had ct for llq pain in [facility]    R/o appendiceal problem  Hernia  Get ct  scan  And plan  Fu     . Diastolic CHF, acute on chronic 01/31/2012  . Atrial fibrillation     a. Dx 01/2014->Eliquis started.  . Cancer   . Pacemaker   . Dysrhythmia   . Family history of adverse reaction to anesthesia     " multiple family members have difficulty waking "  . GERD (gastroesophageal reflux disease)   . Hypothyroidism    Past Surgical History  Procedure Laterality Date  . Cholecystectomy  1999  . Cesarean section      times 2  . Shoulder surgery  1996    Right   . Cataract extraction      Bilateral  implantt  . Cardioversion N/A 02/26/2014    Procedure: CARDIOVERSION AT BEDSIDE;  Surgeon: Pixie Casino, MD;  Location: [place];  Service: Cardiovascular;  Laterality: N/A;  . Cardioversion N/A 04/22/2014    Procedure: CARDIOVERSION (BEDSIDE);  Surgeon: [name], MD;  [facility];  Service: Cardiovascular;  Laterality: N/A;  . Pacemaker insertion  04/23/2014    STJ Assurity dual chamber pacemaker implanted by Dr Rayann Heman  . Insert / replace / remove pacemaker    . Esophagogastroduodenoscopy N/A 07/06/2014    Procedure: ESOPHAGOGASTRODUODENOSCOPY (EGD);  Surgeon: Ladene Artist, MD;  Location: [facility] ENDOSCOPY;  Service: Endoscopy;  Laterality: N/A;  . Left heart catheterization with coronary angiogram N/A 01/29/2014    Procedure: LEFT HEART CATHETERIZATION WITH CORONARY ANGIOGRAM;  Surgeon: Blane Ohara, MD;  Location: [facility] CATH LAB;  Service: Cardiovascular;  Laterality: N/A;  . Permanent pacemaker insertion N/A 04/23/2014    Procedure: PERMANENT PACEMAKER INSERTION;  Surgeon: Evans Lance, MD;  Location: [facility] CATH LAB;  Service: Cardiovascular;  Laterality: N/A;  . Right heart catheterization N/A 09/22/2014    Procedure: RIGHT HEART CATH;  Surgeon: Jolaine Artist, MD;  Location: [facility] CATH LAB;  Service: Cardiovascular;  Laterality: N/A;   Family History  Problem Relation Age of Onset  . Coronary artery disease Father   . Heart disease Father   . Lung cancer     . Alzheimer's disease Mother   . Cancer Brother 39    lung cancer  . Heart attack Father    History  Substance Use Topics  . Smoking status: Never Smoker   . Smokeless tobacco: Never Used  . Alcohol Use: No   OB History    No data available     Review of Systems  Constitutional: Negative for fever and chills.  Eyes: Negative for visual disturbance.  Respiratory: Negative for shortness of breath.   Musculoskeletal: Negative for neck pain.  Skin: Negative for wound.  Neurological: Positive for headaches. Negative for dizziness, weakness and numbness.  All other systems reviewed and are negative.     Allergies  Other and Tramadol  Home Medications   Prior to Admission medications   Medication Sig Start Date End Date Taking? Authorizing Provider  acetaminophen (TYLENOL) 325 MG tablet Take 2 tablets (650 mg total) by mouth every 4 (four) hours as needed for headache or mild pain. 09/26/14  Yes Isaiah Serge, NP  amiodarone (PACERONE) 200 MG tablet Take 1 tablet (200 mg total) by mouth daily. 11/06/14  Yes Jolaine Artist, MD  apixaban (ELIQUIS) 2.5 MG TABS tablet Take 1 tablet (2.5 mg total) by mouth 2 (two) times daily. 12/30/14  Yes Jolaine Artist, MD  erythromycin ophthalmic ointment Place 1 application into both eyes 2 (two) times daily. 05/19/15  Yes Burnis Medin, MD  ferrous sulfate 325 (65 FE) MG tablet Take 1 tablet (325 mg total) by mouth 2 (two) times daily with a meal. 11/21/14  Yes Burnis Medin, MD  levothyroxine (SYNTHROID, LEVOTHROID) 75 MCG tablet Take 1 tablet (75 mcg total) by mouth daily. 11/21/14  Yes Burnis Medin, MD  loratadine (CLARITIN) 10 MG tablet Take 10 mg by mouth at bedtime.    Yes Historical Provider, MD  metolazone (ZAROXOLYN) 2.5 MG tablet Take 1 tablet (2.5 mg total) by mouth as needed (for weight 136 lb or greater). 12/16/14  Yes Jolaine Artist, MD  nitroGLYCERIN (NITROSTAT) 0.4 MG SL tablet Place 1 tablet (0.4 mg total) under the  tongue every 5 (five) minutes as needed. For chest pain 12/02/13  Yes Fay Records, MD  pantoprazole (PROTONIX) 40 MG tablet TAKE ONE TABLET BY MOUTH ONCE DAILY 05/04/15  Yes Burnis Medin, MD  potassium chloride SA (KLOR-CON M20) 20 MEQ tablet Take 1 tablet (20 mEq total) by mouth 3 (three) times daily. Please take 20 meq three times daily. Patient taking differently: Take 60 mEq by mouth every morning.  04/08/15  Yes Jolaine Artist, MD  simvastatin (ZOCOR) 10 MG tablet Take 1 tablet (10 mg total) by mouth at bedtime. 11/06/14  Yes Quillian Quince  R Bensimhon, MD  torsemide (DEMADEX) 20 MG tablet Take 1.5 tablets (30 mg total) by mouth daily. Patient taking differently: Take 40 mg by mouth daily.  05/10/15  Yes Verlee Monte, MD   Triage Vitals: BP 107/49 mmHg  Pulse 70  Temp(Src) 98.2 F (36.8 C) (Oral)  Resp 16  SpO2 97% Physical Exam  Constitutional: She is oriented to person, place, and time. She appears well-developed and well-nourished. No distress.  HENT:  Head: Normocephalic. Head is without raccoon's eyes, without Battle's sign, without abrasion, without contusion and without laceration. Hair is normal.  Eyes: EOM are normal.  Neck: Neck supple. No tracheal deviation present.  Cardiovascular: Normal rate, regular rhythm and normal heart sounds.   Pulmonary/Chest: Effort normal and breath sounds normal. No respiratory distress.  Abdominal: Soft.  Musculoskeletal: Normal range of motion.  Neurological: She is alert and oriented to person, place, and time.  Skin: Skin is warm and dry.  Psychiatric: She has a normal mood and affect. Her behavior is normal.  Nursing note and vitals reviewed.   ED Course  Procedures (including critical care time)  COORDINATION OF CARE: 5:22 PM- Discussed treatment plan with patient at bedside and patient agreed to plan.   Labs Review Labs Reviewed - No data to display  Imaging Review Dg Chest 2 View  05/26/2015   CLINICAL DATA:  Initial encounter  for left-sided chest pain after fall from bed 6 days ago.  EXAM: CHEST  2 VIEW  COMPARISON:  05/09/2015.  FINDINGS: The lungs are clear without focal infiltrate, edema, pneumothorax or pleural effusion. Cardiopericardial silhouette is at upper limits of normal for size. Right-sided dual lead permanent pacemaker again noted. Bones are diffusely demineralized.  IMPRESSION: Borderline cardiomegaly.  No acute cardiopulmonary findings.   Electronically Signed   By: Misty Stanley M.D.   On: 05/26/2015 15:43   Ct Head Wo Contrast  05/26/2015   CLINICAL DATA:  [place] [address] last Wednesday and hit head.  Unsteady gait.  EXAM: CT HEAD WITHOUT CONTRAST  TECHNIQUE: Contiguous axial images were obtained from the base of the skull through the vertex without intravenous contrast.  COMPARISON:  05/09/2015  FINDINGS: Stable age related cerebral atrophy, ventriculomegaly and periventricular white matter disease. No extra-axial fluid collections are identified. No CT findings for acute hemispheric infarction or intracranial hemorrhage. No mass lesions. The brainstem and cerebellum are normal.  No acute skull fracture. Hyperostosis frontalis interna. The paranasal sinuses and mastoid air cells are clear. The globes are intact.  IMPRESSION: No acute intracranial findings or mass lesions.  Chronic white matter changes and mild age related cerebral atrophy.   Electronically Signed   By: Marijo Sanes M.D.   On: 05/26/2015 15:59     EKG Interpretation None      MDM   Final diagnoses:  Fall, initial encounter   Vitals stable. Patient is well-appearing and in no acute distress. X-ray of chest shows no rib fractures, pneumothorax. CT of the head is negative for lesion, bleed, or any acute intracranial findings. She can take Tylenol at home for pain. I discussed keeping her follow-up appointment with her primary care physician next week. Patient verbally agrees with the plan.  I personally performed the services described in this  documentation, which was scribed in my presence. The recorded information has been reviewed and is accurate.    Linda Glazier, PA-C 05/26/15 1732  [name], MD 05/26/15 1818

## 2015-05-26 NOTE — Telephone Encounter (Signed)
Appt scheduled with Dr. Sarajane Jews.

## 2015-05-26 NOTE — ED Notes (Signed)
Pt reports last Wednesday she fell out of bed, since then c/o head pain and pain to left rib cage. Is taking eliquis. Pt is a x 4

## 2015-05-26 NOTE — Telephone Encounter (Signed)
[facility] - Client [place] Patient Name: [name] N DOB: November 15, 1924 Initial Comment caller states she saw the dr last wed - she fell asleep on the bed Friday and fell off - heard a crack - went to an urgent care and was told she bruised her ribs - states she is having pain under her left breast area (rib cage) Nurse Assessment Nurse: Markus Daft, RN, [place] Date/Time (Eastern Time): 05/26/2015 11:00:46 AM Confirm and document reason for call. If symptomatic, describe symptoms. ---Caller states that she fell asleep on the edge of the bed Friday afternoon, and fell off the bed. She heard a crack, unsure of where it was from. States she is having pain under her left breast area/ rib cage which is worse. Rates now at 7/10. She had a raised lump on her top of head. This pain is 4/10. She has persistent dizziness/ lightheaded since the injury seems better in the afternoons, but is always there. -- She went to an urgent care that same day, and was told she bruised her ribs. Has not had a CT scan. Has the patient traveled out of the country within the last 30 days? ---No Does the patient require triage? ---Yes Related visit to physician within the last 2 weeks? ---Yes Does the PT have any chronic conditions? (i.e. diabetes, asthma, etc.) ---Yes List chronic conditions. ---HTN - controlled with meds, High cholesterol, CHF, Varicose Veins, She is on Eliquis (unknown reason) Guidelines Guideline Title Affirmed Question Affirmed Notes Chest Injury [1] High-risk adult (e.g., age > 62, osteoporosis, chronic steroid use) AND [2] still hurts Concussion (mTBI) Less Than 14 Days Ago Follow-up Call Concussion symptoms are worsening headache and dizziness are persisting and the dizziness Final Disposition User Go to ED Now (or PCP triage) Markus Daft, RN, Sherre Poot Comments Per CN - send to Viviano Simas PCP, Dr. Regis Bill, is  not in office today or tomorrow. Appt made with Dr. Sarajane Jews 10:30 am 05/26/15. RN advised that she go on to ER within the next hr. Find a neighbor to drive her. She verbalized understanding. - Appt kept in case she still needs to be seen tomorrow. Referrals REFERRED TO PCP OFFICE PLEASE NOTE: All timestamps contained within this report are represented as [place] Standard Time. CONFIDENTIALTY NOTICE: This fax transmission is intended only for the addressee. It contains information that is legally privileged, confidential or otherwise protected from use or disclosure. If you are not the intended recipient, you are strictly prohibited from reviewing, disclosing, copying using or disseminating any of this information or taking any action in reliance on or regarding this information. If you have received this fax in error, please notify us immediately by telephone so that we can arrange for its return to [place]. Phone: [phone], Toll-Free: [phone], Fax: [phone] Page: 2 of 2 Call Id: [id number] Referrals [facility] - ED Disagree/Comply: Comply Disagree/Comply: Comply

## 2015-05-27 ENCOUNTER — Telehealth: Payer: Self-pay | Admitting: Family Medicine

## 2015-05-27 ENCOUNTER — Ambulatory Visit: Payer: Self-pay | Admitting: Family Medicine

## 2015-05-27 NOTE — Telephone Encounter (Signed)
I called and spoke with pt, she went to [facility] ER and was evaluated. She will follow up with Dr. Regis Bill soon.

## 2015-06-01 ENCOUNTER — Other Ambulatory Visit: Payer: Self-pay | Admitting: Internal Medicine

## 2015-06-02 NOTE — Telephone Encounter (Signed)
Sent to the pharmacy by e-scribe. 

## 2015-06-04 ENCOUNTER — Ambulatory Visit (HOSPITAL_COMMUNITY)
Admission: RE | Admit: 2015-06-04 | Discharge: 2015-06-04 | Disposition: A | Discharge status: Home / Self Care | Payer: Medicare Other | Source: Ambulatory Visit | Attending: Internal Medicine | Admitting: Internal Medicine

## 2015-06-04 VITALS — BP 100/54 | HR 71 | Wt 136.2 lb

## 2015-06-04 DIAGNOSIS — R079 Chest pain, unspecified: Secondary | ICD-10-CM | POA: Diagnosis not present

## 2015-06-04 DIAGNOSIS — I480 Paroxysmal atrial fibrillation: Secondary | ICD-10-CM | POA: Diagnosis not present

## 2015-06-04 DIAGNOSIS — I5032 Chronic diastolic (congestive) heart failure: Secondary | ICD-10-CM | POA: Diagnosis present

## 2015-06-04 DIAGNOSIS — R072 Precordial pain: Secondary | ICD-10-CM

## 2015-06-04 DIAGNOSIS — N183 Chronic kidney disease, stage 3 (moderate): Secondary | ICD-10-CM | POA: Insufficient documentation

## 2015-06-04 DIAGNOSIS — R531 Weakness: Secondary | ICD-10-CM | POA: Insufficient documentation

## 2015-06-04 DIAGNOSIS — I5042 Chronic combined systolic (congestive) and diastolic (congestive) heart failure: Secondary | ICD-10-CM | POA: Diagnosis not present

## 2015-06-04 DIAGNOSIS — I959 Hypotension, unspecified: Secondary | ICD-10-CM | POA: Diagnosis not present

## 2015-06-04 DIAGNOSIS — E876 Hypokalemia: Secondary | ICD-10-CM | POA: Insufficient documentation

## 2015-06-04 LAB — BASIC METABOLIC PANEL WITH GFR
Anion gap: 11 (ref 5–15)
BUN: 39 mg/dL — ABNORMAL HIGH (ref 6–20)
CO2: 29 mmol/L (ref 22–32)
Calcium: 9.5 mg/dL (ref 8.9–10.3)
Chloride: 102 mmol/L (ref 101–111)
Creatinine, Ser: 2.13 mg/dL — ABNORMAL HIGH (ref 0.44–1.00)
GFR calc Af Amer: 23 mL/min — ABNORMAL LOW
GFR calc non Af Amer: 20 mL/min — ABNORMAL LOW
Glucose, Bld: 98 mg/dL (ref 65–99)
Potassium: 3.7 mmol/L (ref 3.5–5.1)
Sodium: 142 mmol/L (ref 135–145)

## 2015-06-04 NOTE — Progress Notes (Signed)
Patient ID: [name], female   DOB: February 10, 1925, 81 y.o.   MRN: [id number]  PCP: Dr Netty Starring Primary Cardiologist: Dr. Harrington Challenger  HPI: [name] is a 81 y.o. female with the history of nonobstructive CAD by cardiac catheterization in 01/2014, persistent atrial fibrillation, diastolic CHF, HTN, HL, anemia, and CKD. Patient was admitted 04/2014 x2 with acute on chronic diastolic CHF. She underwent cardioversion with restoration of NSR. This resulted in profound bradycardia and hypotension requiring pacemaker implantation. Pacemaker implant was complicated by acute blood loss anemia requiring transfusion with PRBCs  Admitted to [facility] August 21 with increased dyspnea. Diuresed with IV lasix and transitioned to po lasix 40 mg twice a day. GI evaluated due to anemia and black stool. Had EGD with no acute findings. Colonoscopy was cancelled per daughter due to multiple medical problems. She continued on eliquis 2.5 mg twice a day.  Palliative Care consulted and the patient and family request aggressive care for heart failure. Discharge weight was 152 pounds.   Admitted to [facility] 11/9 after RHC due to elevated filling pressures. Once diuresed she was put on torsemide 20 mg twice a day. She was also restarted on amiodarone 200 mg daily and continue on eliquis 2.5 mg twice a day. Discharge weight was 141 pounds.   Admitted 12/12 to 12/17 with recurrent HF and AF. Admit weight was 136. Was diuresed to 131. Diuresis limited by low BP. Demadex increased from 20 bid to 40 bid.   Admitted 12/06/14 - 12/09/14 for ADHF. Initial weight 134 pounds. Diuresed down to 130 and felt better. Treatment limited due to hypotension and worsening renal failure with creatinine up to 2.6. Renal u/s ok. Metolazone stopped.  Admitted in 3/16 with hypotension. Started on midodrine 2.5 bid. Demadex cut back from 40 bid to 40 daily.   Admitted 5/17 - 5/18 with recurrent CP. Myoview normal EF > 65% no ischemia.  Admitted 05/09/15 for  chest pain. Demedex decreased to 30 mg daily with worsening creatinine. Troponin flat and without trend. D/c weight 133 lb  She returns today for HF f/u: Here with daughter.  Still having chest pains.  Says she recently felt a "bang" in her head, and didn't feel right the rest of the day. Had episode of dizziness at home along with chest pain she says was around her left breast.  The pain comes and goes regardless of activity, with no aggravating or relieving factors.  Has had no SOB. Weights at home 133-139. Taking all medications. Took a metolazone a few days ago due to weight gain (139) and peripheral edema.  Had relief after that. Watches what she eats salt wise but has been drinking more fluid 2/2 heat.  Studies:  - LHC (3/15): Mid LM 20-30%, mid RCA 30-40%, EF 65%  - Echo (4/15): Mild LVH, EF 60-65%, no RWMA, mild BAE, trivial eff  - Echo 5/15: EF 45-50% septl HK - Carotid US (5/15): 40-59% RICA stenosis. 1-77% LICA stenosis.  - 5/16 Myoview EF > 65% normal perfusion - Cath 3/15:   Left mainstem: The mid left main has 20-30% stenosis and there is moderate calcification noted. Left anterior descending (LAD): There is mild diffuse plaquing without significant stenosis. The first diagonal is patent. Left circumflex (LCx): Left circumflex is patent. The first OM is patent without significant stenosis. Right coronary artery (RCA): This is a large, dominant vessel. The vessel is moderately calcified. The mid vessel has 30-40% stenosis.   [place] 09/22/14  RA = 11 RV =  49/6/10 PA = 49/22 (34) PCW = 18 (v = 25) Fick cardiac output/index = 6.4/4.0 PVR = 2.5 WU FA sat = 95% PA sat = 72%,74%  Labs 9/15 K 3.5 Creatinine 1.44 Hgb 10.7  Labs 8/15 K 4.1 Creatinine 1.46  Hgb 9.8  Labs 8/15 TSH 2.7 Labs 10/15: K 4.0, creatinine 1.93 Labs 11/15 K 4.6 Creatinine 3.9  Labs 11/15 K 3.6 Creatinine 1.61 hgb 13.7 Labs 11/15 K 4.4 Creatinine 1.7 Hgb 14/1  Labs 12/15 K 3.9 Creatinine 1.4  Labs 2/16: K 4.2  Creatinine 2.22 Labs 5/16: K 3.5 Creatinine 1.8 Labs 6/16 K 3.1 Creatinine 2.29  ROS: All systems negative except as listed in HPI, PMH and Problem List.  Past Medical History  Diagnosis Date  . Dyslipidemia   . Hypertension   . Palpitations     PVCs/bigeminy on event in May 2009 revealing this was relatively asymptomatic  . Chest pain     a. 2011 Neg MV;  b. 01/2014 Cath: LM 20-30, LAD nl, D1 nl, LCX nl, OM1 nl, RCA dom 30-67m, PD/PL nl, EF 65%->Med Rx.  . Degenerative joint disease   . Hx of varicella   . History of skin cancer     tafeen/ non melanoma.   . Thrombocytopenia   . Anemia   . Monoclonal gammopathies   . Right lower quadrant abdominal pain 07/19/2012    Recurrent  With nausea    This is new since she had ct for llq pain in [facility]    R/o appendiceal problem  Hernia  Get ct scan  And plan  Fu     . Diastolic CHF, acute on chronic 01/31/2012  . Atrial fibrillation     a. Dx 01/2014->Eliquis started.  . Cancer   . Pacemaker   . Dysrhythmia   . Family history of adverse reaction to anesthesia     " multiple family members have difficulty waking "  . GERD (gastroesophageal reflux disease)   . Hypothyroidism     Current Outpatient Prescriptions  Medication Sig Dispense Refill  . acetaminophen (TYLENOL) 325 MG tablet Take 2 tablets (650 mg total) by mouth every 4 (four) hours as needed for headache or mild pain.    Marland Kitchen amiodarone (PACERONE) 200 MG tablet Take 1 tablet (200 mg total) by mouth daily. 30 tablet 6  . apixaban (ELIQUIS) 2.5 MG TABS tablet Take 1 tablet (2.5 mg total) by mouth 2 (two) times daily. 60 tablet 6  . erythromycin ophthalmic ointment Place 1 application into both eyes 2 (two) times daily. 3.5 g 0  . ferrous sulfate 325 (65 FE) MG tablet TAKE ONE TABLET BY MOUTH TWICE DAILY WITH A MEAL 180 tablet 1  . levothyroxine (SYNTHROID, LEVOTHROID) 75 MCG tablet Take 1 tablet (75 mcg total) by mouth daily. 90 tablet 1  . loratadine (CLARITIN) 10 MG tablet Take 10  mg by mouth at bedtime.     . metolazone (ZAROXOLYN) 2.5 MG tablet Take 1 tablet (2.5 mg total) by mouth as needed (for weight 136 lb or greater). 10 tablet 3  . nitroGLYCERIN (NITROSTAT) 0.4 MG SL tablet Place 1 tablet (0.4 mg total) under the tongue every 5 (five) minutes as needed. For chest pain 25 tablet 6  . pantoprazole (PROTONIX) 40 MG tablet TAKE ONE TABLET BY MOUTH ONCE DAILY 30 tablet 4  . potassium chloride SA (K-DUR,KLOR-CON) 20 MEQ tablet Take 60 mEq by mouth daily.    . simvastatin (ZOCOR) 10 MG tablet Take 1 tablet (  10 mg total) by mouth at bedtime. 30 tablet 6  . torsemide (DEMADEX) 20 MG tablet Take 40 mg by mouth daily.     No current facility-administered medications for this encounter.      Filed Vitals:   06/04/15 1414  BP: 100/54  Pulse: 71  Weight: 136 lb 4 oz (61.803 kg)  SpO2: 98%     PHYSICAL EXAM: General:  Elderly Frail appearing. No resp difficulty. Daughter present HEENT: Normal Neck: supple. JVP  5-6 Carotids 2+ bilaterally; no bruits. No lymphadenopathy or thryomegaly appreciated. Cor: PMI normal. Regular  rate &  rhythm. No rubs, gallops. 1/6 diastolic murmur Lungs: clear Abdomen: soft, nontender, nondistended. No hepatosplenomegaly. No bruits or masses. Good bowel sounds. Extremities: no cyanosis, clubbing, rash, R and LLE severe varicose veins. No edema noted. Neuro: alert & orientedx3, cranial nerves grossly intact. Moves all 4 extremities w/o difficulty. Affect pleasant.   ASSESSMENT & PLAN:  1. Chronic Diastolic Heart Failure - She has a very narrow euvolemic window. Volume status ok. Today. - Cont Toresemide 30 mg once a day.   - Take metolazone only for weight 136 or greater.   Reinforced the need and importance of daily weights, a low sodium diet, and fluid restriction (less than 2 L a day). Instructed to call the HF clinic if weight increases more than 3 lbs overnight or 5 lbs in a week.  2.PAF -  symptomatic bradycardia s/p PPM.   Peviously thought to have failed amiodarone and had chronic AF. However in November in the hospital she was in NSR so amio restarted. - Continue amiodarone 200 mg daily. Continue Eliquis 2.5 mg twice a day. No bleeding problems.   3. Hypotension - Improved. Now off midodrine. Can restart as needed.  4. CKD stage III - Reviewed BMET form 03/23/15. Renal function ok.  5. Deconditioning- Continue HHPT.  6. Hypokalemia - continue kcl 7. Chest pain - resolved. Stress test normal 5/16. Minimal CAD on cath 3/15   BMET today. Follow up  Shirley Friar PA-C 2:48 PM   Patient seen and examined with Oda Kilts, PA-C. We discussed all aspects of the encounter. I agree with the assessment and plan as stated above.   Volume status stable. Continues with intermittent CP but cath and stress test look very good. This is not cardiac related. BMET today.   Bensimhon, Daniel,MD 3:32 PM

## 2015-06-04 NOTE — Patient Instructions (Signed)
We will contact you in 3 months to schedule your next appointment.  

## 2015-06-04 NOTE — Addendum Note (Signed)
Encounter addended by: Scarlette Calico, RN on: 06/04/2015  3:53 PM<BR>     Documentation filed: Dx Association, Patient Instructions Section, Orders

## 2015-06-19 ENCOUNTER — Other Ambulatory Visit (HOSPITAL_COMMUNITY): Payer: Self-pay | Admitting: Internal Medicine

## 2015-06-24 ENCOUNTER — Ambulatory Visit (INDEPENDENT_AMBULATORY_CARE_PROVIDER_SITE_OTHER): Payer: Medicare Other | Admitting: *Deleted

## 2015-06-24 DIAGNOSIS — I4891 Unspecified atrial fibrillation: Secondary | ICD-10-CM

## 2015-06-24 NOTE — Progress Notes (Signed)
Remote pacemaker transmission.   

## 2015-07-01 LAB — CUP PACEART REMOTE DEVICE CHECK
Battery Remaining Longevity: 100 mo
Battery Remaining Percentage: 95.5 %
Brady Statistic AP VS Percent: 1 %
Brady Statistic AS VS Percent: 1 %
Brady Statistic RV Percent Paced: 99 %
Date Time Interrogation Session: 20160810080015
Lead Channel Impedance Value: 460 Ohm
Lead Channel Pacing Threshold Amplitude: 1 V
Lead Channel Sensing Intrinsic Amplitude: 4.3 mV
Lead Channel Sensing Intrinsic Amplitude: 5.1 mV
Lead Channel Setting Pacing Amplitude: 2 V
Lead Channel Setting Pacing Pulse Width: 0.4 ms
Lead Channel Setting Sensing Sensitivity: 2 mV
MDC IDC MSMT BATTERY VOLTAGE: 2.99 V
MDC IDC MSMT LEADCHNL RA PACING THRESHOLD AMPLITUDE: 0.75 V
MDC IDC MSMT LEADCHNL RA PACING THRESHOLD PULSEWIDTH: 0.4 ms
MDC IDC MSMT LEADCHNL RV IMPEDANCE VALUE: 510 Ohm
MDC IDC MSMT LEADCHNL RV PACING THRESHOLD PULSEWIDTH: 0.4 ms
MDC IDC SET LEADCHNL RV PACING AMPLITUDE: 2.5 V
MDC IDC STAT BRADY AP VP PERCENT: 99 %
MDC IDC STAT BRADY AS VP PERCENT: 1 %
MDC IDC STAT BRADY RA PERCENT PACED: 99 %
Pulse Gen Serial Number: 7627922

## 2015-07-03 ENCOUNTER — Encounter (HOSPITAL_COMMUNITY): Payer: Self-pay | Admitting: Emergency Medicine

## 2015-07-03 ENCOUNTER — Emergency Department (HOSPITAL_COMMUNITY)
Admission: EM | Admit: 2015-07-03 | Discharge: 2015-07-03 | Disposition: A | Discharge status: Home / Self Care | Payer: Medicare Other | Attending: Emergency Medicine | Admitting: Emergency Medicine

## 2015-07-03 ENCOUNTER — Emergency Department (HOSPITAL_COMMUNITY): Payer: Medicare Other

## 2015-07-03 DIAGNOSIS — Z8619 Personal history of other infectious and parasitic diseases: Secondary | ICD-10-CM | POA: Diagnosis not present

## 2015-07-03 DIAGNOSIS — R079 Chest pain, unspecified: Secondary | ICD-10-CM | POA: Diagnosis present

## 2015-07-03 DIAGNOSIS — Z951 Presence of aortocoronary bypass graft: Secondary | ICD-10-CM | POA: Insufficient documentation

## 2015-07-03 DIAGNOSIS — R200 Anesthesia of skin: Secondary | ICD-10-CM | POA: Insufficient documentation

## 2015-07-03 DIAGNOSIS — Z9889 Other specified postprocedural states: Secondary | ICD-10-CM | POA: Insufficient documentation

## 2015-07-03 DIAGNOSIS — Z7901 Long term (current) use of anticoagulants: Secondary | ICD-10-CM | POA: Insufficient documentation

## 2015-07-03 DIAGNOSIS — Z85828 Personal history of other malignant neoplasm of skin: Secondary | ICD-10-CM | POA: Diagnosis not present

## 2015-07-03 DIAGNOSIS — I5033 Acute on chronic diastolic (congestive) heart failure: Secondary | ICD-10-CM | POA: Diagnosis not present

## 2015-07-03 DIAGNOSIS — D649 Anemia, unspecified: Secondary | ICD-10-CM | POA: Diagnosis not present

## 2015-07-03 DIAGNOSIS — Z79899 Other long term (current) drug therapy: Secondary | ICD-10-CM | POA: Diagnosis not present

## 2015-07-03 DIAGNOSIS — I10 Essential (primary) hypertension: Secondary | ICD-10-CM | POA: Diagnosis not present

## 2015-07-03 DIAGNOSIS — R202 Paresthesia of skin: Secondary | ICD-10-CM | POA: Diagnosis not present

## 2015-07-03 DIAGNOSIS — E785 Hyperlipidemia, unspecified: Secondary | ICD-10-CM | POA: Diagnosis not present

## 2015-07-03 DIAGNOSIS — Z792 Long term (current) use of antibiotics: Secondary | ICD-10-CM | POA: Diagnosis not present

## 2015-07-03 DIAGNOSIS — E039 Hypothyroidism, unspecified: Secondary | ICD-10-CM | POA: Diagnosis not present

## 2015-07-03 DIAGNOSIS — K219 Gastro-esophageal reflux disease without esophagitis: Secondary | ICD-10-CM | POA: Diagnosis not present

## 2015-07-03 DIAGNOSIS — Z8739 Personal history of other diseases of the musculoskeletal system and connective tissue: Secondary | ICD-10-CM | POA: Diagnosis not present

## 2015-07-03 LAB — BASIC METABOLIC PANEL
Anion gap: 8 (ref 5–15)
BUN: 26 mg/dL — ABNORMAL HIGH (ref 6–20)
CO2: 29 mmol/L (ref 22–32)
Calcium: 9.6 mg/dL (ref 8.9–10.3)
Chloride: 104 mmol/L (ref 101–111)
Creatinine, Ser: 2.13 mg/dL — ABNORMAL HIGH (ref 0.44–1.00)
GFR calc Af Amer: 23 mL/min — ABNORMAL LOW (ref >60)
GFR calc non Af Amer: 20 mL/min — ABNORMAL LOW (ref >60)
Glucose, Bld: 110 mg/dL — ABNORMAL HIGH (ref 65–99)
Potassium: 4.3 mmol/L (ref 3.5–5.1)
Sodium: 141 mmol/L (ref 135–145)

## 2015-07-03 LAB — CBC
HCT: 38 % (ref 36.0–46.0)
Hemoglobin: 12.6 g/dL (ref 12.0–15.0)
MCH: 32.5 pg (ref 26.0–34.0)
MCHC: 33.2 g/dL (ref 30.0–36.0)
MCV: 97.9 fL (ref 78.0–100.0)
Platelets: 114 10*3/uL — ABNORMAL LOW (ref 150–400)
RBC: 3.88 MIL/uL (ref 3.87–5.11)
RDW: 14 % (ref 11.5–15.5)
WBC: 4.2 10*3/uL (ref 4.0–10.5)

## 2015-07-03 LAB — TROPONIN I
Troponin I: 0.04 ng/mL — ABNORMAL HIGH (ref <0.031)
Troponin I: 0.05 ng/mL — ABNORMAL HIGH (ref <0.031)

## 2015-07-03 NOTE — ED Notes (Addendum)
Patient transported to x-ray. ?

## 2015-07-03 NOTE — ED Provider Notes (Signed)
CSN: [id number]     Arrival date & time 07/03/15  9179 History   First MD Initiated Contact with Patient 07/03/15 0901     Chief Complaint  Patient presents with  . Chest Pain     (Consider location/radiation/quality/duration/timing/severity/associated sxs/prior Treatment) HPI   81 year old female with chest pain. Gradual onset around 10:30 AM yesterday. She describes a cold sensation underneath her left breast with occasional sharper pain. She initially felt that this may be muscle pains and began moving around trying to relieve it. This did not improve the pain. It does not make it any worse either though. Pain has waxed and waned since onset, but not completely gone away. Also occasionally gets numbness, tingling in her R hand. This has gone on for months though. Denies acute change. No appreciable exacerbating relieving factors. Denies shortness of breath, nausea or diaphoresis. No fever or chills. No cough. No unusual leg pain or swelling. Reports compliance with medications.   Pertinent past history includes CAD and risk factors (hypertension, obesity, CKD stage 3, HLD), diastolic heart failiure, persistent atrial fibrillation. She had cardiac cath last year on 01/29/2014 by Dr. Burt Knack which demonstrated mild CAD. She had a PPM placed by Dr. Lovena Le for sick sinus syndrome/persistent afib. MR perfusion study on 5/17 which was negative for ischemia.  Past Medical History  Diagnosis Date  . Dyslipidemia   . Hypertension   . Palpitations     PVCs/bigeminy on event in May 2009 revealing this was relatively asymptomatic  . Chest pain     a. 2011 Neg MV;  b. 01/2014 Cath: LM 20-30, LAD nl, D1 nl, LCX nl, OM1 nl, RCA dom 30-59m, PD/PL nl, EF 65%->Med Rx.  . Degenerative joint disease   . Hx of varicella   . History of skin cancer     tafeen/ non melanoma.   . Thrombocytopenia   . Anemia   . Monoclonal gammopathies   . Right lower quadrant abdominal pain 07/19/2012    Recurrent  With nausea     This is new since she had ct for llq pain in [facility]    R/o appendiceal problem  Hernia  Get ct scan  And plan  Fu     . Diastolic CHF, acute on chronic 01/31/2012  . Atrial fibrillation     a. Dx 01/2014->Eliquis started.  . Cancer   . Pacemaker   . Dysrhythmia   . Family history of adverse reaction to anesthesia     " multiple family members have difficulty waking "  . GERD (gastroesophageal reflux disease)   . Hypothyroidism    Past Surgical History  Procedure Laterality Date  . Cholecystectomy  1999  . Cesarean section      times 2  . Shoulder surgery  1996    Right   . Cataract extraction      Bilateral  implantt  . Cardioversion N/A 02/26/2014    Procedure: CARDIOVERSION AT BEDSIDE;  Surgeon: Pixie Casino, MD;  Location: [place];  Service: Cardiovascular;  Laterality: N/A;  . Cardioversion N/A 04/22/2014    Procedure: CARDIOVERSION (BEDSIDE);  Surgeon: [name], MD;  Location[facility];  Service: Cardiovascular;  Laterality: N/A;  . Pacemaker insertion  04/23/2014    STJ Assurity dual chamber pacemaker implanted by Dr Rayann Heman  . Insert / replace / remove pacemaker    . Esophagogastroduodenoscopy N/A 07/06/2014    Procedure: ESOPHAGOGASTRODUODENOSCOPY (EGD);  Surgeon: Ladene Artist, MD;  Location: [facility] ENDOSCOPY;  Service: Endoscopy;  Laterality: N/A;  . Left heart catheterization with coronary angiogram N/A 01/29/2014    Procedure: LEFT HEART CATHETERIZATION WITH CORONARY ANGIOGRAM;  Surgeon: Blane Ohara, MD;  Location: [facility] CATH LAB;  Service: Cardiovascular;  Laterality: N/A;  . Permanent pacemaker insertion N/A 04/23/2014    Procedure: PERMANENT PACEMAKER INSERTION;  Surgeon: Evans Lance, MD;  Location: [facility] CATH LAB;  Service: Cardiovascular;  Laterality: N/A;  . Right heart catheterization N/A 09/22/2014    Procedure: RIGHT HEART CATH;  Surgeon: Jolaine Artist, MD;  Location[facility] CATH LAB;  Service: Cardiovascular;  Laterality: N/A;   Family History  Problem Relation  Age of Onset  . Coronary artery disease Father   . Heart disease Father   . Lung cancer    . Alzheimer's disease Mother   . Cancer Brother 59    lung cancer  . Heart attack Father    Social History  Substance Use Topics  . Smoking status: Never Smoker   . Smokeless tobacco: Never Used  . Alcohol Use: No   OB History    No data available     Review of Systems  All systems reviewed and negative, other than as noted in HPI.   Allergies  Other and Tramadol  Home Medications   Prior to Admission medications   Medication Sig Start Date End Date Taking? Authorizing Provider  acetaminophen (TYLENOL) 325 MG tablet Take 2 tablets (650 mg total) by mouth every 4 (four) hours as needed for headache or mild pain. 09/26/14   Isaiah Serge, NP  amiodarone (PACERONE) 200 MG tablet Take 1 tablet (200 mg total) by mouth daily. 11/06/14   Jolaine Artist, MD  apixaban (ELIQUIS) 2.5 MG TABS tablet Take 1 tablet (2.5 mg total) by mouth 2 (two) times daily. 12/30/14   Jolaine Artist, MD  erythromycin ophthalmic ointment Place 1 application into both eyes 2 (two) times daily. 05/19/15   Burnis Medin, MD  ferrous sulfate 325 (65 FE) MG tablet TAKE ONE TABLET BY MOUTH TWICE DAILY WITH A MEAL 06/02/15   Burnis Medin, MD  levothyroxine (SYNTHROID, LEVOTHROID) 75 MCG tablet Take 1 tablet (75 mcg total) by mouth daily. 11/21/14   Burnis Medin, MD  loratadine (CLARITIN) 10 MG tablet Take 10 mg by mouth at bedtime.     Historical Provider, MD  metolazone (ZAROXOLYN) 2.5 MG tablet Take 1 tablet (2.5 mg total) by mouth as needed (for weight 136 lb or greater). 12/16/14   Jolaine Artist, MD  nitroGLYCERIN (NITROSTAT) 0.4 MG SL tablet Place 1 tablet (0.4 mg total) under the tongue every 5 (five) minutes as needed. For chest pain 12/02/13   Fay Records, MD  pantoprazole (PROTONIX) 40 MG tablet TAKE ONE TABLET BY MOUTH ONCE DAILY 05/04/15   Burnis Medin, MD  potassium chloride SA (K-DUR,KLOR-CON) 20  MEQ tablet Take 60 mEq by mouth daily.    Historical Provider, MD  simvastatin (ZOCOR) 10 MG tablet TAKE ONE TABLET BY MOUTH AT BEDTIME 06/22/15   Jolaine Artist, MD  torsemide (DEMADEX) 20 MG tablet Take 40 mg by mouth daily.    Historical Provider, MD   BP 127/60 mmHg  Pulse 72  Temp(Src) 98 F (36.7 C) (Oral)  Resp 16  Ht 4\' 10"  (1.473 m)  Wt 139 lb (63.05 kg)  BMI 29.06 kg/m2  SpO2 99% Physical Exam  Constitutional: She appears well-developed and well-nourished. No distress.  HENT:  Head: Normocephalic and atraumatic.  Eyes: Conjunctivae are normal. Right eye exhibits no discharge. Left eye exhibits no discharge.  Neck: Neck supple.  Cardiovascular: Normal rate, regular rhythm and normal heart sounds.  Exam reveals no gallop and no friction rub.   No murmur heard. Pulmonary/Chest: Effort normal and breath sounds normal. No respiratory distress. She exhibits no tenderness.  Abdominal: Soft. She exhibits no distension. There is no tenderness.  Musculoskeletal: She exhibits no edema or tenderness.  Lower extremities symmetric as compared to each other. No calf tenderness. Negative Homan's. No palpable cords.   Neurological: She is alert.  Skin: Skin is warm and dry.  Psychiatric: She has a normal mood and affect. Her behavior is normal. Thought content normal.  Nursing note and vitals reviewed.   ED Course  Procedures (including critical care time) Labs Review Labs Reviewed  BASIC METABOLIC PANEL - Abnormal; Notable for the following:    Glucose, Bld 110 (*)    BUN 26 (*)    Creatinine, Ser 2.13 (*)    GFR calc non Af Amer 20 (*)    GFR calc Af Amer 23 (*)    All other components within normal limits  CBC - Abnormal; Notable for the following:    Platelets 114 (*)    All other components within normal limits  TROPONIN I - Abnormal; Notable for the following:    Troponin I 0.04 (*)    All other components within normal limits  TROPONIN I - Abnormal; Notable for the  following:    Troponin I 0.05 (*)    All other components within normal limits    Imaging Review No results found. I have personally reviewed and evaluated these images and lab results as part of my medical decision-making.   EKG Interpretation   Date/Time:  Friday July 03 2015 09:02:12 EDT Ventricular Rate:  70 PR Interval:  164 QRS Duration: 163 QT Interval:  498 QTC Calculation: 537 R Axis:   130 Text Interpretation:  AV dual-paced rhythm Premature ventricular complexes  Confirmed by Wilson Singer  MD, Alicianna Litchford ([id number]) on 07/03/2015 9:04:48 AM      MDM   Final diagnoses:  Chest pain, unspecified chest pain type    89yF with CP. Seems atypical. Admission two months ago for similar sounding symptoms and felt not to be cardiac at that time. This is my initial impression as well. Will obtain CXR and labs including troponin. Doubt PE, dissection, infection or other serious process.   Troponin minimally elevated. Appears to be chronic. Pt has had over 10 troponin values since January. All have been in the range of 0.04 - 0.08. Will repeat in a few hours. Unless repeat is significantly higher, anticipate discharge.     Virgel Manifold, MD 07/08/15 [phone]

## 2015-07-03 NOTE — ED Notes (Addendum)
Phlebotomy at bedside.

## 2015-07-03 NOTE — ED Notes (Signed)
Patient returned from xray, monitor reapplied 

## 2015-07-03 NOTE — ED Notes (Signed)
Per EMS: c/o left sided cp normally on the right, onset last pm, no relief with NTG.  Radiates to back and shoulders, atrial pacemaker in place.  Hx of chf, afib, htn, on elequis.

## 2015-07-03 NOTE — ED Notes (Signed)
Dr. Kohut at bedside 

## 2015-07-03 NOTE — Discharge Instructions (Signed)

## 2015-07-16 ENCOUNTER — Other Ambulatory Visit: Payer: Self-pay | Admitting: Internal Medicine

## 2015-07-16 ENCOUNTER — Encounter: Payer: Self-pay | Admitting: Cardiology

## 2015-07-16 ENCOUNTER — Other Ambulatory Visit (HOSPITAL_COMMUNITY): Payer: Self-pay | Admitting: Internal Medicine

## 2015-07-17 ENCOUNTER — Ambulatory Visit (INDEPENDENT_AMBULATORY_CARE_PROVIDER_SITE_OTHER): Payer: Medicare Other | Admitting: Internal Medicine

## 2015-07-17 ENCOUNTER — Encounter: Payer: Self-pay | Admitting: Internal Medicine

## 2015-07-17 VITALS — BP 136/70 | Temp 98.1°F | Ht <= 58 in | Wt 141.1 lb

## 2015-07-17 DIAGNOSIS — IMO0001 Reserved for inherently not codable concepts without codable children: Secondary | ICD-10-CM

## 2015-07-17 DIAGNOSIS — R34 Anuria and oliguria: Secondary | ICD-10-CM

## 2015-07-17 DIAGNOSIS — R54 Age-related physical debility: Secondary | ICD-10-CM | POA: Diagnosis not present

## 2015-07-17 DIAGNOSIS — R300 Dysuria: Secondary | ICD-10-CM

## 2015-07-17 DIAGNOSIS — I482 Chronic atrial fibrillation, unspecified: Secondary | ICD-10-CM

## 2015-07-17 DIAGNOSIS — I5042 Chronic combined systolic (congestive) and diastolic (congestive) heart failure: Secondary | ICD-10-CM

## 2015-07-17 DIAGNOSIS — M546 Pain in thoracic spine: Secondary | ICD-10-CM

## 2015-07-17 DIAGNOSIS — N183 Chronic kidney disease, stage 3 unspecified: Secondary | ICD-10-CM

## 2015-07-17 DIAGNOSIS — Z7901 Long term (current) use of anticoagulants: Secondary | ICD-10-CM

## 2015-07-17 DIAGNOSIS — N184 Chronic kidney disease, stage 4 (severe): Secondary | ICD-10-CM

## 2015-07-17 LAB — POCT URINALYSIS DIPSTICK
BILIRUBIN UA: NEGATIVE
Blood, UA: NEGATIVE
GLUCOSE UA: NEGATIVE
KETONES UA: NEGATIVE
LEUKOCYTES UA: NEGATIVE
NITRITE UA: NEGATIVE
PH UA: 5.5
Protein, UA: NEGATIVE
Spec Grav, UA: 1.01
Urobilinogen, UA: 0.2

## 2015-07-17 LAB — CBC WITH DIFFERENTIAL/PLATELET
BASOS ABS: 0 10*3/uL (ref 0.0–0.1)
Basophils Relative: 0.9 % (ref 0.0–3.0)
EOS PCT: 0.6 % (ref 0.0–5.0)
Eosinophils Absolute: 0 10*3/uL (ref 0.0–0.7)
HEMATOCRIT: 37.5 % (ref 36.0–46.0)
Hemoglobin: 12.7 g/dL (ref 12.0–15.0)
LYMPHS PCT: 17.5 % (ref 12.0–46.0)
Lymphs Abs: 1 10*3/uL (ref 0.7–4.0)
MCHC: 33.8 g/dL (ref 30.0–36.0)
MCV: 95.7 fl (ref 78.0–100.0)
MONOS PCT: 7.7 % (ref 3.0–12.0)
Monocytes Absolute: 0.4 10*3/uL (ref 0.1–1.0)
NEUTROS ABS: 4.1 10*3/uL (ref 1.4–7.7)
Neutrophils Relative %: 73.3 % (ref 43.0–77.0)
PLATELETS: 135 10*3/uL — AB (ref 150.0–400.0)
RBC: 3.92 Mil/uL (ref 3.87–5.11)
RDW: 14.3 % (ref 11.5–15.5)
WBC: 5.6 10*3/uL (ref 4.0–10.5)

## 2015-07-17 LAB — COMPREHENSIVE METABOLIC PANEL
ALBUMIN: 3.9 g/dL (ref 3.5–5.2)
ALT: 12 U/L (ref 0–35)
AST: 16 U/L (ref 0–37)
Alkaline Phosphatase: 102 U/L (ref 39–117)
BILIRUBIN TOTAL: 0.6 mg/dL (ref 0.2–1.2)
BUN: 38 mg/dL — ABNORMAL HIGH (ref 6–23)
CALCIUM: 9.7 mg/dL (ref 8.4–10.5)
CO2: 29 mEq/L (ref 19–32)
CREATININE: 2.34 mg/dL — AB (ref 0.40–1.20)
Chloride: 108 mEq/L (ref 96–112)
GFR: 20.83 mL/min — AB (ref >60.00)
Glucose, Bld: 90 mg/dL (ref 70–99)
Potassium: 4.2 mEq/L (ref 3.5–5.1)
Sodium: 146 mEq/L — ABNORMAL HIGH (ref 135–145)
Total Protein: 7 g/dL (ref 6.0–8.3)

## 2015-07-17 LAB — SEDIMENTATION RATE: Sed Rate: 24 mm/hr — ABNORMAL HIGH (ref 0–22)

## 2015-07-17 MED ORDER — TRAMADOL HCL 50 MG PO TABS: 25.0000 mg | ORAL_TABLET | Freq: Three times a day (TID) | ORAL | Status: DC | PRN

## 2015-07-17 NOTE — Progress Notes (Signed)
Pre visit review using our clinic review tool, if applicable. No additional management support is needed unless otherwise documented below in the visit note.   Chief Complaint  Patient presents with  . Dysuria  . Abdominal Pain    HPI: Patient [name]  comes in today for SDA for  new problem evaluation. 4 days ago    Onset of groin pain on right   Now pain in back  No abd pain .  Had some  And some left back pain .   Hurts ot get .   no real dysuria   Until today  Not going as much     Decrease  Urinary output ?and no discomfort .  No urinary retention sx   Thinks drinking  Ok.  Days. Says dec urine output ?  no eating today.  No constipations.  No falling   very painful  Right side movig  Nurse with bd bs said should come to office before the weekend.? Got ride to office    Lives alone has family  In town.  Pt says had nausea with tramadol but    was on iron at the time  And no allergic    tylenol  Not  enough  Even took and advil  Says has gained a few pound with the daily scale monitoring    Uncertain of all her meds   Nurse said come in   Today? ROS: See pertinent positives and negatives per HPI. No change in sob  No falling no new bleeding fever chills   Past Medical History  Diagnosis Date  . Dyslipidemia   . Hypertension   . Palpitations     PVCs/bigeminy on event in May 2009 revealing this was relatively asymptomatic  . Chest pain     a. 2011 Neg MV;  b. 01/2014 Cath: LM 20-30, LAD nl, D1 nl, LCX nl, OM1 nl, RCA dom 30-56m, PD/PL nl, EF 65%->Med Rx.  . Degenerative joint disease   . Hx of varicella   . History of skin cancer     tafeen/ non melanoma.   . Thrombocytopenia   . Anemia   . Monoclonal gammopathies   . Right lower quadrant abdominal pain 07/19/2012    Recurrent  With nausea    This is new since she had ct for llq pain in [facility]    R/o appendiceal problem  Hernia  Get ct scan  And plan  Fu     . Diastolic CHF, acute on chronic 01/31/2012  . Atrial  fibrillation     a. Dx 01/2014->Eliquis started.  . Cancer   . Pacemaker   . Dysrhythmia   . Family history of adverse reaction to anesthesia     " multiple family members have difficulty waking "  . GERD (gastroesophageal reflux disease)   . Hypothyroidism     Family History  Problem Relation Age of Onset  . Coronary artery disease Father   . Heart disease Father   . Lung cancer    . Alzheimer's disease Mother   . Cancer Brother 34    lung cancer  . Heart attack Father     Social History   Social History  . Marital Status: Widowed    Spouse Name: N/A  . Number of Children: 3  . Years of Education: N/A   Occupational History  .      [employer], book keeping   Social History Main Topics  . Smoking status:  Never Smoker   . Smokeless tobacco: Never Used  . Alcohol Use: No  . Drug Use: No  . Sexual Activity: No   Other Topics Concern  . 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 Eats with her.      Daughter handles medications. Sees HH and PT once a week.    Outpatient Prescriptions Prior to Visit  Medication Sig Dispense Refill  . acetaminophen (TYLENOL) 325 MG tablet Take 2 tablets (650 mg total) by mouth every 4 (four) hours as needed for headache or mild pain.    Marland Kitchen amiodarone (PACERONE) 200 MG tablet Take 1 tablet (200 mg total) by mouth daily. 30 tablet 6  . apixaban (ELIQUIS) 2.5 MG TABS tablet Take 1 tablet (2.5 mg total) by mouth 2 (two) times daily. 60 tablet 6  . erythromycin ophthalmic ointment Place 1 application into both eyes 2 (two) times daily. 3.5 g 0  . ferrous sulfate 325 (65 FE) MG tablet TAKE ONE TABLET BY MOUTH TWICE DAILY WITH A MEAL 180 tablet 1  . furosemide (LASIX) 20 MG tablet Take 20 mg by mouth 2 (two) times  daily.    Marland Kitchen levothyroxine (SYNTHROID, LEVOTHROID) 75 MCG tablet TAKE ONE TABLET BY MOUTH ONCE DAILY 90 tablet 0  . loratadine (CLARITIN) 10 MG tablet Take 10 mg by mouth at bedtime.     . metolazone (ZAROXOLYN) 2.5 MG tablet Take 1 tablet (2.5 mg total) by mouth as needed (for weight 136 lb or greater). 10 tablet 3  . nitroGLYCERIN (NITROSTAT) 0.4 MG SL tablet Place 1 tablet (0.4 mg total) under the tongue every 5 (five) minutes as needed. For chest pain 25 tablet 6  . pantoprazole (PROTONIX) 40 MG tablet TAKE ONE TABLET BY MOUTH ONCE DAILY 30 tablet 4  . potassium chloride SA (K-DUR,KLOR-CON) 20 MEQ tablet Take 60 mEq by mouth daily.    . simvastatin (ZOCOR) 10 MG tablet TAKE ONE TABLET BY MOUTH AT BEDTIME 30 tablet 0  . torsemide (DEMADEX) 20 MG tablet Take 40 mg by mouth daily.     No facility-administered medications prior to visit.     EXAM:  BP 136/70 mmHg  Temp(Src) 98.1 F (36.7 C) (Oral)  Ht 4\' 10"  (1.473 m)  Wt 141 lb 1.6 oz (64.003 kg)  BMI 29.50 kg/m2  Body mass index is 29.5 kg/(m^2).  GENERAL: vitals reviewed and listed above, alert, oriented, appears well hydrated and in no acute distress except in mpain when mmoved  Right side thorax back  HEENT: atraumatic, conjunctiva  clear, no obvious abnormalities on inspection of external nose and ears NECK: no obvious masses on inspection palpation  LUNGS: clear to auscultation bilaterally, no wheezes, rales or rhonchi,   Kyphosis   Very tender  Right lateral rib cage  Inferior abd soft  ? Neg flank pain ? CV:i HRRR, no clubbing cyanosis or  Min edema nl cap refill  Vv  MS: moves all extremities without noticeable focal  Abnormality  Bent over gaot with cane PSYch neuro  Cognition intact cooperative, no obvious depression or anxiety Helped with assistenac ton to exam table    Has good balance  Pain seems ms area   Wt Readings from Last 3 Encounters:  07/17/15 141 lb 1.6 oz (64.003 kg)  07/03/15 139 lb (63.05 kg)  06/04/15  136 lb 4 oz (61.803 kg)   BP Readings from Last 3 Encounters:  07/17/15 136/70  07/03/15 101/44  06/04/15 100/54   ua clear  Ds    ASSESSMENT AND PLAN:  Discussed the following assessment and plan:  Right-sided thoracic back pain - Plan: CBC with Differential/Platelet, Sedimentation rate, Comprehensive metabolic panel, DG Chest 2 View  Dysuria - Plan: POC Urinalysis Dipstick, Culture, Urine, CBC with Differential/Platelet, Sedimentation rate, Comprehensive metabolic panel, DG Chest 2 View  Decreased urine volume - Plan: CBC with Differential/Platelet, Sedimentation rate, Comprehensive metabolic panel, DG Chest 2 View  Age factor  Chronic renal disease, stage III  Chronic atrial fibrillation  CKD (chronic kidney disease) stage 4, GFR 15-29 ml/min  Chronic combined systolic and diastolic CHF (congestive heart failure)  Chronic anticoagulation In setting of chf  Hx of tenuous fluid status  And sig kyphosis    Pain seems related to rib cage but with hx of radiation  .  Poss to have early uti   i suppose   Check labs and x ray if she has a rid to get one.   To help her   She wants to go ahead and try tramadol again for acute pain.   This is 3 day  Weekend  Seek care if needed for alarm sx  May need to empirically rx for  Atypical infection if  Wbc elevated pending cx  -Patient advised to return or notify health care team  if symptoms worsen ,persist or new concerns arise. Total visit 45 min s > 50% spent counseling and coordinating care as indicated in above note and in instructions to patient .     Patient Instructions  Uncertain cause of your pain  You are very tender at the base oif your right rib cage  . Urine screen is normal but will check for infection. Blood test  And chest x ray today  Then go from there . Usually xrta strength tylenol for pain   Can use  Few days  Pain pulss such as hydrocodone or codiene ultram   If needed in the short run but they can cause   Nausea  and constipation.  Seek emergent care if pain getting worse or fever.   Get advice from heart failure team about fluid balance issues and   Weight changes  If needed     Standley Brooking. Amunique Neyra M.D.   Lab Results  Component Value Date   WBC 4.2 07/03/2015   HGB 12.6 07/03/2015   HCT 38.0 07/03/2015   PLT 114* 07/03/2015   GLUCOSE 110* 07/03/2015   CHOL 141 04/14/2014   TRIG 56 04/14/2014   HDL 89 04/14/2014   LDLCALC 41 04/14/2014   ALT 18 03/31/2015   AST 20 03/31/2015   NA 141 07/03/2015   K 4.3 07/03/2015   CL 104 07/03/2015   CREATININE 2.13* 07/03/2015   BUN 26* 07/03/2015   CO2 29 07/03/2015   TSH 1.701 05/09/2015   INR 1.28 03/31/2015

## 2015-07-17 NOTE — Telephone Encounter (Signed)
Sent to the pharmacy by e-scribe. 

## 2015-07-17 NOTE — Patient Instructions (Addendum)
Uncertain cause of your pain  You are very tender at the base oif your right rib cage  . Urine screen is normal but will check for infection. Blood test  And chest x ray today  Then go from there . Usually xrta strength tylenol for pain   Can use  Few days  Pain pulss such as hydrocodone or codiene ultram   If needed in the short run but they can cause   Nausea and constipation.  Seek emergent care if pain getting worse or fever.   Get advice from heart failure team about fluid balance issues and   Weight changes  If needed

## 2015-07-18 ENCOUNTER — Encounter (HOSPITAL_COMMUNITY): Payer: Self-pay | Admitting: Emergency Medicine

## 2015-07-18 ENCOUNTER — Emergency Department (HOSPITAL_COMMUNITY): Payer: Medicare Other

## 2015-07-18 ENCOUNTER — Emergency Department (HOSPITAL_COMMUNITY)
Admission: EM | Admit: 2015-07-18 | Discharge: 2015-07-18 | Disposition: A | Discharge status: Home / Self Care | Payer: Medicare Other | Attending: Emergency Medicine | Admitting: Emergency Medicine

## 2015-07-18 DIAGNOSIS — Z950 Presence of cardiac pacemaker: Secondary | ICD-10-CM | POA: Diagnosis not present

## 2015-07-18 DIAGNOSIS — I5033 Acute on chronic diastolic (congestive) heart failure: Secondary | ICD-10-CM | POA: Insufficient documentation

## 2015-07-18 DIAGNOSIS — Z8619 Personal history of other infectious and parasitic diseases: Secondary | ICD-10-CM | POA: Insufficient documentation

## 2015-07-18 DIAGNOSIS — Z862 Personal history of diseases of the blood and blood-forming organs and certain disorders involving the immune mechanism: Secondary | ICD-10-CM | POA: Diagnosis not present

## 2015-07-18 DIAGNOSIS — M1990 Unspecified osteoarthritis, unspecified site: Secondary | ICD-10-CM | POA: Insufficient documentation

## 2015-07-18 DIAGNOSIS — K219 Gastro-esophageal reflux disease without esophagitis: Secondary | ICD-10-CM | POA: Diagnosis not present

## 2015-07-18 DIAGNOSIS — M791 Myalgia: Secondary | ICD-10-CM | POA: Diagnosis not present

## 2015-07-18 DIAGNOSIS — Z79899 Other long term (current) drug therapy: Secondary | ICD-10-CM | POA: Diagnosis not present

## 2015-07-18 DIAGNOSIS — Z859 Personal history of malignant neoplasm, unspecified: Secondary | ICD-10-CM | POA: Insufficient documentation

## 2015-07-18 DIAGNOSIS — E039 Hypothyroidism, unspecified: Secondary | ICD-10-CM | POA: Diagnosis not present

## 2015-07-18 DIAGNOSIS — I10 Essential (primary) hypertension: Secondary | ICD-10-CM | POA: Insufficient documentation

## 2015-07-18 DIAGNOSIS — E785 Hyperlipidemia, unspecified: Secondary | ICD-10-CM | POA: Diagnosis not present

## 2015-07-18 DIAGNOSIS — R1031 Right lower quadrant pain: Secondary | ICD-10-CM | POA: Diagnosis present

## 2015-07-18 DIAGNOSIS — M7918 Myalgia, other site: Secondary | ICD-10-CM

## 2015-07-18 LAB — BASIC METABOLIC PANEL
Anion gap: 8 (ref 5–15)
BUN: 30 mg/dL — AB (ref 6–20)
CALCIUM: 9.2 mg/dL (ref 8.9–10.3)
CHLORIDE: 105 mmol/L (ref 101–111)
CO2: 26 mmol/L (ref 22–32)
CREATININE: 2.14 mg/dL — AB (ref 0.44–1.00)
GFR calc non Af Amer: 19 mL/min — ABNORMAL LOW (ref >60)
GFR, EST AFRICAN AMERICAN: 23 mL/min — AB (ref >60)
GLUCOSE: 103 mg/dL — AB (ref 65–99)
Potassium: 4.4 mmol/L (ref 3.5–5.1)
Sodium: 139 mmol/L (ref 135–145)

## 2015-07-18 LAB — CBC WITH DIFFERENTIAL/PLATELET
Basophils Absolute: 0 10*3/uL (ref 0.0–0.1)
Basophils Relative: 1 % (ref 0–1)
EOS ABS: 0.1 10*3/uL (ref 0.0–0.7)
Eosinophils Relative: 2 % (ref 0–5)
HCT: 39.4 % (ref 36.0–46.0)
HEMOGLOBIN: 12.9 g/dL (ref 12.0–15.0)
LYMPHS ABS: 0.9 10*3/uL (ref 0.7–4.0)
Lymphocytes Relative: 18 % (ref 12–46)
MCH: 32.2 pg (ref 26.0–34.0)
MCHC: 32.7 g/dL (ref 30.0–36.0)
MCV: 98.3 fL (ref 78.0–100.0)
MONO ABS: 0.3 10*3/uL (ref 0.1–1.0)
MONOS PCT: 7 % (ref 3–12)
NEUTROS PCT: 73 % (ref 43–77)
Neutro Abs: 3.9 10*3/uL (ref 1.7–7.7)
Platelets: 112 10*3/uL — ABNORMAL LOW (ref 150–400)
RBC: 4.01 MIL/uL (ref 3.87–5.11)
RDW: 13.9 % (ref 11.5–15.5)
WBC: 5.2 10*3/uL (ref 4.0–10.5)

## 2015-07-18 LAB — URINALYSIS, ROUTINE W REFLEX MICROSCOPIC
BILIRUBIN URINE: NEGATIVE
GLUCOSE, UA: NEGATIVE mg/dL
HGB URINE DIPSTICK: NEGATIVE
Ketones, ur: NEGATIVE mg/dL
Nitrite: NEGATIVE
PROTEIN: NEGATIVE mg/dL
SPECIFIC GRAVITY, URINE: 1.009 (ref 1.005–1.030)
Urobilinogen, UA: 1 mg/dL (ref 0.0–1.0)
pH: 5 (ref 5.0–8.0)

## 2015-07-18 LAB — URINE MICROSCOPIC-ADD ON

## 2015-07-18 MED ORDER — HYDROCODONE-ACETAMINOPHEN 5-325 MG PO TABS
1.0000 | ORAL_TABLET | Freq: Once | ORAL | Status: AC
  Administered 2015-07-18: 1 via ORAL
  Filled 2015-07-18: qty 1

## 2015-07-18 MED ORDER — IOHEXOL 300 MG/ML  SOLN: 50.0000 mL | Freq: Once | INTRAMUSCULAR | Status: DC | PRN

## 2015-07-18 MED ORDER — HYDROCODONE-ACETAMINOPHEN 5-325 MG PO TABS: 1.0000 | ORAL_TABLET | Freq: Four times a day (QID) | ORAL | Status: DC | PRN

## 2015-07-18 MED ORDER — FENTANYL CITRATE (PF) 100 MCG/2ML IJ SOLN: 25.0000 ug | INTRAMUSCULAR | Status: DC | PRN

## 2015-07-18 NOTE — Discharge Instructions (Signed)
Back Injury Prevention °Back injuries can be extremely painful and difficult to heal. After having one back injury, you are much more likely to experience another later on. It is important to learn how to avoid injuring or re-injuring your back. The following tips can help you to prevent a back injury. °PHYSICAL FITNESS °· Exercise regularly and try to develop good tone in your abdominal muscles. Your abdominal muscles provide a lot of the support needed by your back. °· Do aerobic exercises (walking, jogging, biking, swimming) regularly. °· Do exercises that increase balance and strength (tai chi, yoga) regularly. This can decrease your risk of falling and injuring your back. °· Stretch before and after exercising. °· Maintain a healthy weight. The more you weigh, the more stress is placed on your back. For every pound of weight, 10 times that amount of pressure is placed on the back. °DIET °· Talk to your caregiver about how much calcium and vitamin D you need per day. These nutrients help to prevent weakening of the bones (osteoporosis). Osteoporosis can cause broken (fractured) bones that lead to back pain. °· Include good sources of calcium in your diet, such as dairy products, green, leafy vegetables, and products with calcium added (fortified). °· Include good sources of vitamin D in your diet, such as milk and foods that are fortified with vitamin D. °· Consider taking a nutritional supplement or a multivitamin if needed. °· Stop smoking if you smoke. °POSTURE °· Sit and stand up straight. Avoid leaning forward when you sit or hunching over when you stand. °· Choose chairs with good low back (lumbar) support. °· If you work at a desk, sit close to your work so you do not need to lean over. Keep your chin tucked in. Keep your neck drawn back and elbows bent at a right angle. Your arms should look like the letter "L." °· Sit high and close to the steering wheel when you drive. Add a lumbar support to your car  seat if needed. °· Avoid sitting or standing in one position for too long. Take breaks to get up, stretch, and walk around at least once every hour. Take breaks if you are driving for long periods of time. °· Sleep on your side with your knees slightly bent, or sleep on your back with a pillow under your knees. Do not sleep on your stomach. °LIFTING, TWISTING, AND REACHING °· Avoid heavy lifting, especially repetitive lifting. If you must do heavy lifting: °· Stretch before lifting. °· Work slowly. °· Rest between lifts. °· Use carts and dollies to move objects when possible. °· Make several small trips instead of carrying 1 heavy load. °· Ask for help when you need it. °· Ask for help when moving big, awkward objects. °· Follow these steps when lifting: °· Stand with your feet shoulder-width apart. °· Get as close to the object as you can. Do not try to pick up heavy objects that are far from your body. °· Use handles or lifting straps if they are available. °· Bend at your knees. Squat down, but keep your heels off the floor. °· Keep your shoulders pulled back, your chin tucked in, and your back straight. °· Lift the object slowly, tightening the muscles in your legs, abdomen, and buttocks. Keep the object as close to the center of your body as possible. °· When you put a load down, use these same guidelines in reverse. °· Do not: °· Lift the object above your waist. °·   Twist at the waist while lifting or carrying a load. Move your feet if you need to turn, not your waist.  Bend over without bending at your knees.  Avoid reaching over your head, across a table, or for an object on a high surface. OTHER TIPS  Avoid wet floors and keep sidewalks clear of ice to prevent falls.  Do not sleep on a mattress that is too soft or too hard.  Keep items that are used frequently within easy reach.  Put heavier objects on shelves at waist level and lighter objects on lower or higher shelves.  Find ways to  decrease your stress, such as exercise, massage, or relaxation techniques. Stress can build up in your muscles. Tense muscles are more vulnerable to injury.  Seek treatment for depression or anxiety if needed. These conditions can increase your risk of developing back pain. SEEK MEDICAL CARE IF:  You injure your back.  You have questions about diet, exercise, or other ways to prevent back injuries. MAKE SURE YOU:  Understand these instructions.  Will watch your condition.  Will get help right away if you are not doing well or get worse. Document Released: 12/08/2004 Document Revised: 01/23/2012 Document Reviewed: 12/12/2011 [facility] Patient Information 2015 [place], [place]. This information is not intended to replace advice given to you by your health care provider. Make sure you discuss any questions you have with your health care provider. Hip Exercises RANGE OF MOTION (ROM) AND STRETCHING EXERCISES  These exercises may help you when beginning to rehabilitate your injury. Doing them too aggressively can worsen your condition. Complete them slowly and gently. Your symptoms may resolve with or without further involvement from your physician, physical therapist or athletic trainer. While completing these exercises, remember:   Restoring tissue flexibility helps normal motion to return to the joints. This allows healthier, less painful movement and activity.  An effective stretch should be held for at least 30 seconds.  A stretch should never be painful. You should only feel a gentle lengthening or release in the stretched tissue. If these stretches worsen your symptoms even when done gently, consult your physician, physical therapist or athletic trainer. STRETCH - Hamstrings, Supine   Lie on your back. Loop a belt or towel over the ball of your right / left foot.  Straighten your right / left knee and slowly pull on the belt to raise your leg. Do not allow the right / left knee to bend.  Keep your opposite leg flat on the floor.  Raise the leg until you feel a gentle stretch behind your right / left knee or thigh. Hold this position for __________ seconds. Repeat __________ times. Complete this stretch __________ times per day.  STRETCH - Hip Rotators   Lie on your back on a firm surface. Grasp your right / left knee with your right / left hand and your ankle with your opposite hand.  Keeping your hips and shoulders firmly planted, gently pull your right / left knee and rotate your lower leg toward your opposite shoulder until you feel a stretch in your buttocks.  Hold this stretch for __________ seconds. Repeat this stretch __________ times. Complete this stretch __________ times per day. STRETCH - Hamstrings/Adductors, V-Sit   Sit on the floor with your legs extended in a large "V," keeping your knees straight.  With your head and chest upright, bend at your waist reaching for your right foot to stretch your left adductors.  You should feel a stretch in  your left inner thigh. Hold for __________ seconds.  Return to the upright position to relax your leg muscles.  Continuing to keep your chest upright, bend straight forward at your waist to stretch your hamstrings.  You should feel a stretch behind both of your thighs and/or knees. Hold for __________ seconds.  Return to the upright position to relax your leg muscles.  Repeat steps 2 through 4 for opposite leg. Repeat __________ times. Complete this exercise __________ times per day.  STRETCHING - Hip Flexors, Lunge  Half kneel with your right / left knee on the floor and your opposite knee bent and directly over your ankle.  Keep good posture with your head over your shoulders. Tighten your buttocks to point your tailbone downward; this will prevent your back from arching too much.  You should feel a gentle stretch in the front of your thigh and/or hip. If you do not feel any resistance, slightly slide your  opposite foot forward and then slowly lunge forward so your knee once again lines up over your ankle. Be sure your tailbone remains pointed downward.  Hold this stretch for __________ seconds. Repeat __________ times. Complete this stretch __________ times per day. STRENGTHENING EXERCISES These exercises may help you when beginning to rehabilitate your injury. They may resolve your symptoms with or without further involvement from your physician, physical therapist or athletic trainer. While completing these exercises, remember:   Muscles can gain both the endurance and the strength needed for everyday activities through controlled exercises.  Complete these exercises as instructed by your physician, physical therapist or athletic trainer. Progress the resistance and repetitions only as guided.  You may experience muscle soreness or fatigue, but the pain or discomfort you are trying to eliminate should never worsen during these exercises. If this pain does worsen, stop and make certain you are following the directions exactly. If the pain is still present after adjustments, discontinue the exercise until you can discuss the trouble with your clinician. STRENGTH - Hip Extensors, Bridge   Lie on your back on a firm surface. Bend your knees and place your feet flat on the floor.  Tighten your buttocks muscles and lift your bottom off the floor until your trunk is level with your thighs. You should feel the muscles in your buttocks and back of your thighs working. If you do not feel these muscles, slide your feet 1-2 inches further away from your buttocks.  Hold this position for __________ seconds.  Slowly lower your hips to the starting position and allow your buttock muscles relax completely before beginning the next repetition.  If this exercise is too easy, you may cross your arms over your chest. Repeat __________ times. Complete this exercise __________ times per day.  STRENGTH - Hip  Abductors, Straight Leg Raises  Be aware of your form throughout the entire exercise so that you exercise the correct muscles. Sloppy form means that you are not strengthening the correct muscles.  Lie on your side so that your head, shoulders, knee and hip line up. You may bend your lower knee to help maintain your balance. Your right / left leg should be on top.  Roll your hips slightly forward, so that your hips are stacked directly over each other and your right / left knee is facing forward.  Lift your top leg up 4-6 inches, leading with your heel. Be sure that your foot does not drift forward or that your knee does not roll toward the ceiling.  Hold this position for __________ seconds. You should feel the muscles in your outer hip lifting (you may not notice this until your leg begins to tire).  Slowly lower your leg to the starting position. Allow the muscles to fully relax before beginning the next repetition. Repeat __________ times. Complete this exercise __________ times per day.  STRENGTH - Hip Adductors, Straight Leg Raises   Lie on your side so that your head, shoulders, knee and hip line up. You may place your upper foot in front to help maintain your balance. Your right / left leg should be on the bottom.  Roll your hips slightly forward, so that your hips are stacked directly over each other and your right / left knee is facing forward.  Tense the muscles in your inner thigh and lift your bottom leg 4-6 inches. Hold this position for __________ seconds.  Slowly lower your leg to the starting position. Allow the muscles to fully relax before beginning the next repetition. Repeat __________ times. Complete this exercise __________ times per day.  STRENGTH - Quadriceps, Straight Leg Raises  Quality counts! Watch for signs that the quadriceps muscle is working to insure you are strengthening the correct muscles and not "cheating" by substituting with healthier muscles.  Lay on  your back with your right / left leg extended and your opposite knee bent.  Tense the muscles in the front of your right / left thigh. You should see either your knee cap slide up or increased dimpling just above the knee. Your thigh may even quiver.  Tighten these muscles even more and raise your leg 4 to 6 inches off the floor. Hold for right / left seconds.  Keeping these muscles tense, lower your leg.  Relax the muscles slowly and completely in between each repetition. Repeat __________ times. Complete this exercise __________ times per day.  STRENGTH - Hip Abductors, Standing  Tie one end of a rubber exercise band/tubing to a secure surface (table, pole) and tie a loop at the other end.  Place the loop around your right / left ankle. Keeping your ankle with the band directly opposite of the secured end, step away until there is tension in the tube/band.  Hold onto a chair as needed for balance.  Keeping your back upright, your shoulders over your hips, and your toes pointing forward, lift your right / left leg out to your side. Be sure to lift your leg with your hip muscles. Do not "throw" your leg or tip your body to lift your leg.  Slowly and with control, return to the starting position. Repeat exercise __________ times. Complete this exercise __________ times per day.  STRENGTH - Quadriceps, Squats  Stand in a door frame so that your feet and knees are in line with the frame.  Use your hands for balance, not support, on the frame.  Slowly lower your weight, bending at the hips and knees. Keep your lower legs upright so that they are parallel with the door frame. Squat only within the range that does not increase your knee pain. Never let your hips drop below your knees.  Slowly return upright, pushing with your legs, not pulling with your hands. Document Released: 11/18/2005 Document Revised: 01/23/2012 Document Reviewed: 02/12/2009 [facility] Patient Information 2015  [place], [place]. This information is not intended to replace advice given to you by your health care provider. Make sure you discuss any questions you have with your health care provider.

## 2015-07-18 NOTE — ED Notes (Signed)
Pt refused IV pain meds.

## 2015-07-18 NOTE — ED Notes (Signed)
This RN supervised patient ambulate approximately 377ft in the hallway with use of personal cane. Reports mild back pain that is chronic. No assistance from RN required during ambulation. Returned patient to bed.

## 2015-07-18 NOTE — ED Provider Notes (Signed)
CSN: [id number]     Arrival date & time 07/18/15  1448 History   First MD Initiated Contact with Patient 07/18/15 1536     Chief Complaint  Patient presents with  . Flank Pain  . Hip Pain     (Consider location/radiation/quality/duration/timing/severity/associated sxs/prior Treatment) Patient is a 81 y.o. female presenting with flank pain and hip pain. The history is provided by the patient.  Flank Pain This is a new problem. The current episode started 2 days ago. The problem occurs constantly. The problem has been gradually worsening. Associated symptoms include abdominal pain (right lower). Pertinent negatives include no shortness of breath. Nothing aggravates the symptoms. Nothing relieves the symptoms. She has tried nothing for the symptoms. The treatment provided no relief.  Hip Pain This is a new problem. The problem occurs constantly. Associated symptoms include abdominal pain (right lower). Pertinent negatives include no shortness of breath. The symptoms are aggravated by bending.    Past Medical History  Diagnosis Date  . Dyslipidemia   . Hypertension   . Palpitations     PVCs/bigeminy on event in May 2009 revealing this was relatively asymptomatic  . Chest pain     a. 2011 Neg MV;  b. 01/2014 Cath: LM 20-30, LAD nl, D1 nl, LCX nl, OM1 nl, RCA dom 30-65m, PD/PL nl, EF 65%->Med Rx.  . Degenerative joint disease   . Hx of varicella   . History of skin cancer     tafeen/ non melanoma.   . Thrombocytopenia   . Anemia   . Monoclonal gammopathies   . Right lower quadrant abdominal pain 07/19/2012    Recurrent  With nausea    This is new since she had ct for llq pain in [facility]    R/o appendiceal problem  Hernia  Get ct scan  And plan  Fu     . Diastolic CHF, acute on chronic 01/31/2012  . Atrial fibrillation     a. Dx 01/2014->Eliquis started.  . Cancer   . Pacemaker   . Dysrhythmia   . Family history of adverse reaction to anesthesia     " multiple family members have  difficulty waking "  . GERD (gastroesophageal reflux disease)   . Hypothyroidism    Past Surgical History  Procedure Laterality Date  . Cholecystectomy  1999  . Cesarean section      times 2  . Shoulder surgery  1996    Right   . Cataract extraction      Bilateral  implantt  . Cardioversion N/A 02/26/2014    Procedure: CARDIOVERSION AT BEDSIDE;  Surgeon: Pixie Casino, MD;  Location: [facility];  Service: Cardiovascular;  Laterality: N/A;  . Cardioversion N/A 04/22/2014    Procedure: CARDIOVERSION (BEDSIDE);  Surgeon: [name], MD;  Location: [facility] OR;  Service: Cardiovascular;  Laterality: N/A;  . Pacemaker insertion  04/23/2014    STJ Assurity dual chamber pacemaker implanted by Dr Rayann Heman  . Insert / replace / remove pacemaker    . Esophagogastroduodenoscopy N/A 07/06/2014    Procedure: ESOPHAGOGASTRODUODENOSCOPY (EGD);  Surgeon: Ladene Artist, MD;  Location: [facility] ENDOSCOPY;  Service: Endoscopy;  Laterality: N/A;  . Left heart catheterization with coronary angiogram N/A 01/29/2014    Procedure: LEFT HEART CATHETERIZATION WITH CORONARY ANGIOGRAM;  Surgeon: Blane Ohara, MD;  Location[facility] CATH LAB;  Service: Cardiovascular;  Laterality: N/A;  . Permanent pacemaker insertion N/A 04/23/2014    Procedure: PERMANENT PACEMAKER INSERTION;  Surgeon: Evans Lance, MD;  Location: [facility] CATH LAB;  Service: Cardiovascular;  Laterality: N/A;  . Right heart catheterization N/A 09/22/2014    Procedure: RIGHT HEART CATH;  Surgeon: Jolaine Artist, MD;  Location: [facility] CATH LAB;  Service: Cardiovascular;  Laterality: N/A;   Family History  Problem Relation Age of Onset  . Coronary artery disease Father   . Heart disease Father   . Lung cancer    . Alzheimer's disease Mother   . Cancer Brother 32    lung cancer  . Heart attack Father    Social History  Substance Use Topics  . Smoking status: Never Smoker   . Smokeless tobacco: Never Used  . Alcohol Use: No   OB History    No data available      Review of Systems  Respiratory: Negative for shortness of breath.   Gastrointestinal: Positive for abdominal pain (right lower).  Genitourinary: Positive for flank pain.  All other systems reviewed and are negative.     Allergies  Other; Tramadol; and Ibuprofen  Home Medications   Prior to Admission medications   Medication Sig Start Date End Date Taking? Authorizing Provider  acetaminophen (TYLENOL) 325 MG tablet Take 2 tablets (650 mg total) by mouth every 4 (four) hours as needed for headache or mild pain. 09/26/14  Yes Isaiah Serge, NP  amiodarone (PACERONE) 200 MG tablet Take 1 tablet (200 mg total) by mouth daily. 11/06/14  Yes Jolaine Artist, MD  apixaban (ELIQUIS) 2.5 MG TABS tablet Take 1 tablet (2.5 mg total) by mouth 2 (two) times daily. 12/30/14  Yes Jolaine Artist, MD  erythromycin ophthalmic ointment Place 1 application into both eyes 2 (two) times daily. 05/19/15  Yes Burnis Medin, MD  ferrous sulfate 325 (65 FE) MG tablet TAKE ONE TABLET BY MOUTH TWICE DAILY WITH A MEAL 06/02/15  Yes Burnis Medin, MD  furosemide (LASIX) 20 MG tablet Take 20 mg by mouth 2 (two) times daily.   Yes Historical Provider, MD  levothyroxine (SYNTHROID, LEVOTHROID) 75 MCG tablet TAKE ONE TABLET BY MOUTH ONCE DAILY 07/17/15  Yes Burnis Medin, MD  loratadine (CLARITIN) 10 MG tablet Take 10 mg by mouth at bedtime.    Yes Historical Provider, MD  metolazone (ZAROXOLYN) 2.5 MG tablet Take 1 tablet (2.5 mg total) by mouth as needed (for weight 136 lb or greater). 12/16/14  Yes Jolaine Artist, MD  nitroGLYCERIN (NITROSTAT) 0.4 MG SL tablet Place 1 tablet (0.4 mg total) under the tongue every 5 (five) minutes as needed. For chest pain 12/02/13  Yes Fay Records, MD  pantoprazole (PROTONIX) 40 MG tablet TAKE ONE TABLET BY MOUTH ONCE DAILY 05/04/15  Yes Burnis Medin, MD  potassium chloride SA (K-DUR,KLOR-CON) 20 MEQ tablet Take 60 mEq by mouth daily.   Yes Historical Provider, MD   simvastatin (ZOCOR) 10 MG tablet TAKE ONE TABLET BY MOUTH AT BEDTIME 07/16/15  Yes Jolaine Artist, MD  torsemide (DEMADEX) 20 MG tablet Take 40 mg by mouth daily.   Yes Historical Provider, MD  traMADol (ULTRAM) 50 MG tablet Take 0.5-1 tablets (25-50 mg total) by mouth every 8 (eight) hours as needed for moderate pain or severe pain. 07/17/15   Burnis Medin, MD   BP 126/57 mmHg  Pulse 70  Temp(Src) 98.6 F (37 C) (Oral)  Resp 18  Ht 4\' 10"  (1.473 m)  Wt 141 lb (63.957 kg)  BMI 29.48 kg/m2  SpO2 99% Physical Exam  Constitutional: She is  oriented to person, place, and time. She appears well-developed and well-nourished. No distress.  HENT:  Head: Normocephalic.  Eyes: Conjunctivae are normal.  Neck: Neck supple. No tracheal deviation present.  Cardiovascular: Normal rate and regular rhythm.   Pulmonary/Chest: Effort normal. No respiratory distress.  Abdominal: Soft. Normal appearance. She exhibits no distension. There is no hepatosplenomegaly. There is no rigidity, no rebound and no guarding.  Tenderness over right flank extending to inguinal area and RLQ of abdomen, similar tenderness over right mid-back with palpation, no midline tenderness  Neurological: She is alert and oriented to person, place, and time.  Skin: Skin is warm and dry.  Psychiatric: She has a normal mood and affect.    ED Course  Procedures (including critical care time) Labs Review Labs Reviewed  BASIC METABOLIC PANEL - Abnormal; Notable for the following:    Glucose, Bld 103 (*)    BUN 30 (*)    Creatinine, Ser 2.14 (*)    GFR calc non Af Amer 19 (*)    GFR calc Af Amer 23 (*)    All other components within normal limits  CBC WITH DIFFERENTIAL/PLATELET - Abnormal; Notable for the following:    Platelets 112 (*)    All other components within normal limits  URINALYSIS, ROUTINE W REFLEX MICROSCOPIC (NOT AT [facility]) - Abnormal; Notable for the following:    Leukocytes, UA SMALL (*)    All other components  within normal limits  URINE MICROSCOPIC-ADD ON - Abnormal; Notable for the following:    Casts HYALINE CASTS (*)    All other components within normal limits    Imaging Review Ct Abdomen Pelvis Wo Contrast  07/18/2015   CLINICAL DATA:  81 year old female with abdominal pain.  EXAM: CT ABDOMEN AND PELVIS WITHOUT CONTRAST  TECHNIQUE: Multidetector CT imaging of the abdomen and pelvis was performed following the standard protocol without IV contrast.  COMPARISON:  04/2012 and prior CTs  FINDINGS: Please note that parenchymal abnormalities may be missed without intravenous contrast.  Lower chest:  Cardiomegaly and pacemaker leads noted.  Hepatobiliary: No significant hepatic abnormalities are identified. The patient is status post cholecystectomy. There is no evidence of biliary dilatation.  Pancreas: Unremarkable  Spleen: Unremarkable  Adrenals/Urinary Tract: Bilateral renal atrophy and a left extrarenal pelvis again noted. The adrenal glands and bladder are unremarkable.  Stomach/Bowel: Colonic diverticulosis noted without evidence of diverticulitis. There is no evidence of bowel obstruction or definite bowel wall thickening.  Vascular/Lymphatic: No enlarged lymph nodes or abdominal aortic aneurysm. Aortic atherosclerotic calcifications are noted.  Reproductive: Unremarkable  Other: No free fluid or pneumoperitoneum.  Musculoskeletal: No acute or suspicious abnormalities identified. Lumbar spine scoliosis and degenerative changes are again noted.  IMPRESSION: No acute abnormalities.  Cardiomegaly, renal atrophy and colonic diverticulosis.   Electronically Signed   By: Margarette Canada M.D.   On: 07/18/2015 19:33   I have personally reviewed and evaluated these images and lab results as part of my medical decision-making.   EKG Interpretation None      MDM   Final diagnoses:  Musculoskeletal pain    81 year old female presents with right flank and right lower quadrant abdominal pain over the course of  the last few days after strenuous activity. She is anticoagulated on Eliquis. Distribution of pain is concerning for nephrolithiasis, retroperitoneal hematoma with anticoagulation, occult lower right-sided rib fracture, urinary tract infection, muscle strain or spasm. No palpable spasm, no evidence of infection or obstruction on lab workup or CT scan. No evidence  of retroperitoneal hematoma. Patient improved after dose of pain medication here. No other acute injuries identified on CT. Patient ambulatory without difficulty. Suspect musculoskeletal etiology for pain at this time. Plan to follow up with PCP as needed and return precautions discussed for worsening or new concerning symptoms.     Leo Grosser, MD 07/18/15 2012

## 2015-07-18 NOTE — ED Notes (Signed)
Reported patient's request for pain meds to take at home.

## 2015-07-18 NOTE — ED Notes (Signed)
Pt c/o right hip pain onset yesterday. Pt seen by PMD but could not get xrays done. Pt tried to go to an urgent care on [address] but was told she needed to come to ED

## 2015-07-19 LAB — URINE CULTURE
Colony Count: NO GROWTH
ORGANISM ID, BACTERIA: NO GROWTH

## 2015-07-21 ENCOUNTER — Encounter: Payer: Self-pay | Admitting: Family Medicine

## 2015-07-21 ENCOUNTER — Telehealth: Payer: Self-pay | Admitting: Internal Medicine

## 2015-07-21 ENCOUNTER — Ambulatory Visit (INDEPENDENT_AMBULATORY_CARE_PROVIDER_SITE_OTHER): Payer: Medicare Other | Admitting: Family Medicine

## 2015-07-21 VITALS — BP 118/72 | HR 74 | Temp 98.6°F

## 2015-07-21 DIAGNOSIS — R109 Unspecified abdominal pain: Secondary | ICD-10-CM | POA: Diagnosis not present

## 2015-07-21 DIAGNOSIS — R1031 Right lower quadrant pain: Secondary | ICD-10-CM

## 2015-07-21 DIAGNOSIS — M25551 Pain in right hip: Secondary | ICD-10-CM

## 2015-07-21 MED ORDER — OXYCODONE-ACETAMINOPHEN 5-325 MG PO TABS: 0.5000 | ORAL_TABLET | Freq: Four times a day (QID) | ORAL | Status: DC | PRN

## 2015-07-21 NOTE — Telephone Encounter (Signed)
i saw her last week and  She went to ed  After that  What is she taking for pain ?  Did the hydrocodone help?  We can refill this for 16  If it helped .  Can we get her in to see ortho?  Concerned she could have a rib or spine related pain .

## 2015-07-21 NOTE — Progress Notes (Signed)
Linda Reddish, MD  Subjective:  [name] is a 81 y.o. year old very pleasant female patient who presents with:  -Seenon 07/18/15 in the emergency room with Right flank, RLQ abdominal pain after strenuous activity. Patient tells me before this started was working in her cabinets for 4 hours while climbing a ladder. She is On eliquis. Concern was for nephrolithiasis, retoperitoneal hematoma, or rib fracture, UTI, muslcle strain. Had CT scan in the ED which was reassuring but did not show a cause of pain. Dr. Regis Bill had seen day prior- between 2 visits had reassuring urine and culture, stabel kidney function, no elevated WBC, mild elevation of ESR to 24.   Patient's pain has become so severe up to 10/10 with movement that she has been unable to get up to get herself food. Has required full assist from neighbors for all meals. She was this way in ED but refused admission per patient and family. Called our office today and Dr. Regis Bill wanted to consider orthopedics visit. Rib or spine related. Hydrocodone reportedly has  not help with pain, but did put her to sleep. Pain just as bad when woke up.   ROS- no chest pain or shortness of breath, no fever, chills, nause  Past Medical History- CKD IV, mild thrombocytopenia, MGUS, hypothyroid, hypertension, CHF- combined diastolic and systolic  Medications- reviewed and updated Current Outpatient Prescriptions  Medication Sig Dispense Refill  . acetaminophen (TYLENOL) 325 MG tablet Take 2 tablets (650 mg total) by mouth every 4 (four) hours as needed for headache or mild pain.    Marland Kitchen amiodarone (PACERONE) 200 MG tablet Take 1 tablet (200 mg total) by mouth daily. 30 tablet 6  . apixaban (ELIQUIS) 2.5 MG TABS tablet Take 1 tablet (2.5 mg total) by mouth 2 (two) times daily. 60 tablet 6  . erythromycin ophthalmic ointment Place 1 application into both eyes 2 (two) times daily. 3.5 g 0  . ferrous sulfate 325 (65 FE) MG tablet TAKE ONE TABLET BY MOUTH  TWICE DAILY WITH A MEAL 180 tablet 1  . furosemide (LASIX) 20 MG tablet Take 20 mg by mouth 2 (two) times daily.    Marland Kitchen HYDROcodone-acetaminophen (NORCO/VICODIN) 5-325 MG per tablet Take 1 tablet by mouth every 6 (six) hours as needed for moderate pain. 6 tablet 0  . levothyroxine (SYNTHROID, LEVOTHROID) 75 MCG tablet TAKE ONE TABLET BY MOUTH ONCE DAILY 90 tablet 0  . loratadine (CLARITIN) 10 MG tablet Take 10 mg by mouth at bedtime.     . metolazone (ZAROXOLYN) 2.5 MG tablet Take 1 tablet (2.5 mg total) by mouth as needed (for weight 136 lb or greater). 10 tablet 3  . nitroGLYCERIN (NITROSTAT) 0.4 MG SL tablet Place 1 tablet (0.4 mg total) under the tongue every 5 (five) minutes as needed. For chest pain 25 tablet 6  . pantoprazole (PROTONIX) 40 MG tablet TAKE ONE TABLET BY MOUTH ONCE DAILY 30 tablet 4  . potassium chloride SA (K-DUR,KLOR-CON) 20 MEQ tablet Take 60 mEq by mouth daily.    . simvastatin (ZOCOR) 10 MG tablet TAKE ONE TABLET BY MOUTH AT BEDTIME 30 tablet 0  . torsemide (DEMADEX) 20 MG tablet Take 40 mg by mouth daily.    . traMADol (ULTRAM) 50 MG tablet Take 0.5-1 tablets (25-50 mg total) by mouth every 8 (eight) hours as needed for moderate pain or severe pain. 24 tablet 0   Objective: BP 118/72 mmHg  Pulse 74  Temp(Src) 98.6 F (37 C)  Wt  Gen: NAD, resting comfortably CV: RRR no murmurs rubs or gallops Lungs: CTAB no crackles, wheeze, rhonchi Abdomen: soft/nontender/nondistended/normal bowel sounds. No rebound or guarding.  MSK-pain in thoracic spine midline that radiates to right side on at least 3 levels of thoracic spine, moderate pain over right hip but no pain in groin. Limited hip exam as unable to get onto table. No pain over ribs.  Ext: no edema Skin: warm, dry Neuro: grossly normal, moves all extremities,normal strength in legs but sitting in wheelchair, pain in groin/hip limit from standing  Assessment/Plan:  Right hip/flank/RLQ pain Also with midline thoracic  pain  - Plan: Ambulatory referral to Orthopedic Surgery Difficult situation in medically complex 81 year old female. She is brought today by her neighbors as patient's daughter reportedly has MS and cancer and has a difficult time supporting mother. Mother lives alone. Neighbors to bring patient to visit. We discussed moving forward with dedicated right rib films (though think fracture unlikely), R hip films, lumbar and thoracic rib films. My highest level of concern is compression fracture as palpation over thoracic spine presented greatest level of pain that radiated to her side. It would be easier for patient and helpers if this was all done at an ortho visit and could discuss follow up at that time. I discussed with patient I did not think it was ideal for her to live at home and to strongly consider assisted living. She is of sound mind so she cannot be forced into this but her neighbors may not always be around to support her in time of leave. Honestly, patient likely could have been admitted, worked up, then admitted to SNF for rehab before assisted living but she declined this option by refusing admission. Discussed return to ER was still an option as well as given strict Return precautions advised. Short course oxycodone to get her to [place] ortho visit hopefully by end of week. Discussed case with Dr. Regis Bill and she agrees  Orders Placed This Encounter  Procedures  . Ambulatory referral to Orthopedic Surgery    Referral Priority:  Urgent    Referral Type:  Surgical    Referral Reason:  Specialty Services Required    Requested Specialty:  Orthopedic Surgery    Number of Visits Requested:  1    Meds ordered this encounter  Medications  . oxyCODONE-acetaminophen (PERCOCET/ROXICET) 5-325 MG per tablet    Sig: Take 0.5-1 tablets by mouth every 6 (six) hours as needed for severe pain.    Dispense:  16 tablet    Refill:  0

## 2015-07-21 NOTE — Telephone Encounter (Signed)
Called to speak to the pt.  She is currently waiting to see [name].

## 2015-07-21 NOTE — Telephone Encounter (Signed)
Patient Name: [name]  DOB: [birth year]    Initial Comment Caller states she is having lower back pain   Nurse Assessment  Nurse: Verlin Fester, RN, [facility] Date/Time (Eastern Time): 07/21/2015 11:07:11 AM  Confirm and document reason for call. If symptomatic, describe symptoms. ---Caller states patient was seen on Friday and then she went into ED and they did xrays and told her that she has severe musculo- skeletal bruising. She is having so much pain that the pain medicine is not helping. She having difficulty getting to the bathroom.  Has the patient traveled out of the country within the last 30 days? ---No  Does the patient require triage? ---Yes  Related visit to physician within the last 2 weeks? ---Yes  Does the PT have any chronic conditions? (i.e. diabetes, asthma, etc.) ---No     Guidelines    Guideline Title Affirmed Question Affirmed Notes  Back Pain [1] SEVERE back pain (e.g., excruciating, unable to do any normal activities) AND [2] not improved 2 hours after pain medicine    Final Disposition User   See Physician within 4 Hours (or PCP triage) Verlin Fester, RN, [facility]    Referrals  REFERRED TO PCP OFFICE   Disagree/Comply: Comply

## 2015-07-21 NOTE — Patient Instructions (Signed)
Try to get into orthopedics by end of the week Orders Placed This Encounter  Procedures  . Ambulatory referral to Orthopedic Surgery    Referral Priority:  Urgent    Referral Type:  Surgical    Referral Reason:  Specialty Services Required    Requested Specialty:  Orthopedic Surgery    Number of Visits Requested:  1   Oxycodone for pain  We opted not to pursue x-rays before going to orthopedics

## 2015-07-27 ENCOUNTER — Encounter: Payer: Self-pay | Admitting: Internal Medicine

## 2015-07-29 ENCOUNTER — Telehealth: Payer: Self-pay | Admitting: Internal Medicine

## 2015-07-29 ENCOUNTER — Observation Stay (HOSPITAL_COMMUNITY)
Admission: EM | Admit: 2015-07-29 | Discharge: 2015-07-30 | Disposition: A | Discharge status: Home / Self Care | Payer: Medicare Other | Attending: Internal Medicine | Admitting: Internal Medicine

## 2015-07-29 DIAGNOSIS — K219 Gastro-esophageal reflux disease without esophagitis: Secondary | ICD-10-CM | POA: Diagnosis not present

## 2015-07-29 DIAGNOSIS — I493 Ventricular premature depolarization: Secondary | ICD-10-CM | POA: Diagnosis not present

## 2015-07-29 DIAGNOSIS — Z91048 Other nonmedicinal substance allergy status: Secondary | ICD-10-CM | POA: Insufficient documentation

## 2015-07-29 DIAGNOSIS — Z885 Allergy status to narcotic agent status: Secondary | ICD-10-CM | POA: Diagnosis not present

## 2015-07-29 DIAGNOSIS — R0602 Shortness of breath: Secondary | ICD-10-CM | POA: Diagnosis not present

## 2015-07-29 DIAGNOSIS — D472 Monoclonal gammopathy: Secondary | ICD-10-CM | POA: Insufficient documentation

## 2015-07-29 DIAGNOSIS — I2510 Atherosclerotic heart disease of native coronary artery without angina pectoris: Secondary | ICD-10-CM | POA: Insufficient documentation

## 2015-07-29 DIAGNOSIS — Z7901 Long term (current) use of anticoagulants: Secondary | ICD-10-CM | POA: Diagnosis not present

## 2015-07-29 DIAGNOSIS — D696 Thrombocytopenia, unspecified: Secondary | ICD-10-CM | POA: Diagnosis not present

## 2015-07-29 DIAGNOSIS — Z950 Presence of cardiac pacemaker: Secondary | ICD-10-CM | POA: Diagnosis not present

## 2015-07-29 DIAGNOSIS — R079 Chest pain, unspecified: Secondary | ICD-10-CM | POA: Diagnosis present

## 2015-07-29 DIAGNOSIS — Z85828 Personal history of other malignant neoplasm of skin: Secondary | ICD-10-CM | POA: Insufficient documentation

## 2015-07-29 DIAGNOSIS — N184 Chronic kidney disease, stage 4 (severe): Secondary | ICD-10-CM | POA: Insufficient documentation

## 2015-07-29 DIAGNOSIS — R0789 Other chest pain: Secondary | ICD-10-CM | POA: Diagnosis not present

## 2015-07-29 DIAGNOSIS — E785 Hyperlipidemia, unspecified: Secondary | ICD-10-CM | POA: Diagnosis not present

## 2015-07-29 DIAGNOSIS — I517 Cardiomegaly: Secondary | ICD-10-CM | POA: Diagnosis not present

## 2015-07-29 DIAGNOSIS — I5042 Chronic combined systolic (congestive) and diastolic (congestive) heart failure: Secondary | ICD-10-CM | POA: Diagnosis not present

## 2015-07-29 DIAGNOSIS — D649 Anemia, unspecified: Secondary | ICD-10-CM | POA: Diagnosis not present

## 2015-07-29 DIAGNOSIS — I129 Hypertensive chronic kidney disease with stage 1 through stage 4 chronic kidney disease, or unspecified chronic kidney disease: Secondary | ICD-10-CM | POA: Diagnosis not present

## 2015-07-29 DIAGNOSIS — E039 Hypothyroidism, unspecified: Secondary | ICD-10-CM | POA: Diagnosis not present

## 2015-07-29 DIAGNOSIS — M1990 Unspecified osteoarthritis, unspecified site: Secondary | ICD-10-CM | POA: Insufficient documentation

## 2015-07-29 DIAGNOSIS — Z886 Allergy status to analgesic agent status: Secondary | ICD-10-CM | POA: Diagnosis not present

## 2015-07-29 DIAGNOSIS — I5032 Chronic diastolic (congestive) heart failure: Secondary | ICD-10-CM | POA: Diagnosis present

## 2015-07-29 DIAGNOSIS — I4891 Unspecified atrial fibrillation: Secondary | ICD-10-CM | POA: Diagnosis present

## 2015-07-29 DIAGNOSIS — I480 Paroxysmal atrial fibrillation: Secondary | ICD-10-CM | POA: Diagnosis not present

## 2015-07-29 MED ORDER — NITROGLYCERIN 0.4 MG SL SUBL: 0.4000 mg | SUBLINGUAL_TABLET | SUBLINGUAL | Status: DC | PRN

## 2015-07-29 NOTE — Telephone Encounter (Signed)
Pt call from [facility] home [facility] to say that pt sope with nurse and told her she was having pain in upper bilateral extrem. diffcult .with ambulation Ar[facility] Go is asking for physical therapy for pt    Pam [phone] ext [phone]

## 2015-07-29 NOTE — ED Provider Notes (Signed)
CSN: [id number]     Arrival date & time 07/29/15  2355 History  This chart was scribed for Everlene Balls, MD by Meriel Pica, ED Scribe. This patient was seen in room B17C/B17C and the patient's care was started 12:00 AM.   Chief Complaint  Patient presents with  . Chest Pain    The patient was at home sitting in the bed watching TV and started feeling chest heaviness with radiation to the mid lower back area.   The history is provided by the EMS personnel and the patient. No language interpreter was used.   HPI Comments: [name] is a 81 y.o. female, with a PMhx of HTN, palpitations, CP, Afib, GERD, and a pacemaker placed in R chest, brought in by ambulance, who presents to the Emergency Department complaining of sudden onset, intermittent chest pain in bilateral upper chest that radiates to her thoracic spine region. Pt reports onset of pain tonight while watching TV. She describes her current CP to be a heaviness and states it is different pain than prior CP. Pt has a right, upper chest pacemaker that was placed 2 years ago. Denies associated nausea, vomiting, or diaphoresis.    Past Medical History  Diagnosis Date  . Dyslipidemia   . Hypertension   . Palpitations     PVCs/bigeminy on event in May 2009 revealing this was relatively asymptomatic  . Chest pain     a. 2011 Neg MV;  b. 01/2014 Cath: LM 20-30, LAD nl, D1 nl, LCX nl, OM1 nl, RCA dom 30-3m, PD/PL nl, EF 65%->Med Rx.  . Degenerative joint disease   . Hx of varicella   . History of skin cancer     tafeen/ non melanoma.   . Thrombocytopenia   . Anemia   . Monoclonal gammopathies   . Right lower quadrant abdominal pain 07/19/2012    Recurrent  With nausea    This is new since she had ct for llq pain in [facility]    R/o appendiceal problem  Hernia  Get ct scan  And plan  Fu     . Diastolic CHF, acute on chronic 01/31/2012  . Atrial fibrillation     a. Dx 01/2014->Eliquis started.  . Cancer   . Pacemaker   . Dysrhythmia    . Family history of adverse reaction to anesthesia     " multiple family members have difficulty waking "  . GERD (gastroesophageal reflux disease)   . Hypothyroidism    Past Surgical History  Procedure Laterality Date  . Cholecystectomy  1999  . Cesarean section      times 2  . Shoulder surgery  1996    Right   . Cataract extraction      Bilateral  implantt  . Cardioversion N/A 02/26/2014    Procedure: CARDIOVERSION AT BEDSIDE;  Surgeon: Pixie Casino, MD;  Location: [facility];  Service: Cardiovascular;  Laterality: N/A;  . Cardioversion N/A 04/22/2014    Procedure: CARDIOVERSION (BEDSIDE);  Surgeon: [name], MD;  Location: [facility] OR;  Service: Cardiovascular;  Laterality: N/A;  . Pacemaker insertion  04/23/2014    STJ Assurity dual chamber pacemaker implanted by Dr Rayann Heman  . Insert / replace / remove pacemaker    . Esophagogastroduodenoscopy N/A 07/06/2014    Procedure: ESOPHAGOGASTRODUODENOSCOPY (EGD);  Surgeon: Ladene Artist, MD;  Location: [facility] ENDOSCOPY;  Service: Endoscopy;  Laterality: N/A;  . Left heart catheterization with coronary angiogram N/A 01/29/2014    Procedure: LEFT HEART CATHETERIZATION  WITH CORONARY ANGIOGRAM;  Surgeon: Blane Ohara, MD;  Location: [facility] CATH LAB;  Service: Cardiovascular;  Laterality: N/A;  . Permanent pacemaker insertion N/A 04/23/2014    Procedure: PERMANENT PACEMAKER INSERTION;  Surgeon: Evans Lance, MD;  Location: [facility] CATH LAB;  Service: Cardiovascular;  Laterality: N/A;  . Right heart catheterization N/A 09/22/2014    Procedure: RIGHT HEART CATH;  Surgeon: Jolaine Artist, MD;  Location: [facility] CATH LAB;  Service: Cardiovascular;  Laterality: N/A;   Family History  Problem Relation Age of Onset  . Coronary artery disease Father   . Heart disease Father   . Lung cancer    . Alzheimer's disease Mother   . Cancer Brother 46    lung cancer  . Heart attack Father    Social History  Substance Use Topics  . Smoking status: Never Smoker   .  Smokeless tobacco: Never Used  . Alcohol Use: No   OB History    No data available     Review of Systems 10 Systems reviewed and are negative for acute change except as noted in the HPI.  Allergies  Other; Tramadol; and Ibuprofen  Home Medications   Prior to Admission medications   Medication Sig Start Date End Date Taking? Authorizing Provider  acetaminophen (TYLENOL) 325 MG tablet Take 2 tablets (650 mg total) by mouth every 4 (four) hours as needed for headache or mild pain. 09/26/14   Isaiah Serge, NP  amiodarone (PACERONE) 200 MG tablet Take 1 tablet (200 mg total) by mouth daily. 11/06/14   Jolaine Artist, MD  apixaban (ELIQUIS) 2.5 MG TABS tablet Take 1 tablet (2.5 mg total) by mouth 2 (two) times daily. 12/30/14   Jolaine Artist, MD  erythromycin ophthalmic ointment Place 1 application into both eyes 2 (two) times daily. 05/19/15   Burnis Medin, MD  ferrous sulfate 325 (65 FE) MG tablet TAKE ONE TABLET BY MOUTH TWICE DAILY WITH A MEAL 06/02/15   Burnis Medin, MD  furosemide (LASIX) 20 MG tablet Take 20 mg by mouth 2 (two) times daily.    Historical Provider, MD  HYDROcodone-acetaminophen (NORCO/VICODIN) 5-325 MG per tablet Take 1 tablet by mouth every 6 (six) hours as needed for moderate pain. 07/18/15   Leo Grosser, MD  levothyroxine (SYNTHROID, LEVOTHROID) 75 MCG tablet TAKE ONE TABLET BY MOUTH ONCE DAILY 07/17/15   Burnis Medin, MD  loratadine (CLARITIN) 10 MG tablet Take 10 mg by mouth at bedtime.     Historical Provider, MD  metolazone (ZAROXOLYN) 2.5 MG tablet Take 1 tablet (2.5 mg total) by mouth as needed (for weight 136 lb or greater). 12/16/14   Jolaine Artist, MD  nitroGLYCERIN (NITROSTAT) 0.4 MG SL tablet Place 1 tablet (0.4 mg total) under the tongue every 5 (five) minutes as needed. For chest pain 12/02/13   Fay Records, MD  oxyCODONE-acetaminophen (PERCOCET/ROXICET) 5-325 MG per tablet Take 0.5-1 tablets by mouth every 6 (six) hours as needed for severe  pain. 07/21/15   Marin Olp, MD  pantoprazole (PROTONIX) 40 MG tablet TAKE ONE TABLET BY MOUTH ONCE DAILY 05/04/15   Burnis Medin, MD  potassium chloride SA (K-DUR,KLOR-CON) 20 MEQ tablet Take 60 mEq by mouth daily.    Historical Provider, MD  simvastatin (ZOCOR) 10 MG tablet TAKE ONE TABLET BY MOUTH AT BEDTIME 07/16/15   Jolaine Artist, MD  torsemide (DEMADEX) 20 MG tablet Take 40 mg by mouth daily.    Historical  Provider, MD  traMADol (ULTRAM) 50 MG tablet Take 0.5-1 tablets (25-50 mg total) by mouth every 8 (eight) hours as needed for moderate pain or severe pain. 07/17/15   Burnis Medin, MD   SpO2 96% Physical Exam  Constitutional: She is oriented to person, place, and time. She appears well-developed and well-nourished. No distress.  HENT:  Head: Normocephalic and atraumatic.  Nose: Nose normal.  Mouth/Throat: Oropharynx is clear and moist. No oropharyngeal exudate.  Eyes: Conjunctivae and EOM are normal. Pupils are equal, round, and reactive to light. No scleral icterus.  Neck: Normal range of motion. Neck supple. No JVD present. No tracheal deviation present. No thyromegaly present.  Cardiovascular: Normal rate and regular rhythm.  Exam reveals no gallop and no friction rub.   Murmur heard. Systolic murmur; pacemaker palpated in right chest.   Pulmonary/Chest: Effort normal and breath sounds normal. No respiratory distress. She has no wheezes. She exhibits no tenderness.  Abdominal: Soft. Bowel sounds are normal. She exhibits no distension and no mass. There is no tenderness. There is no rebound and no guarding.  Musculoskeletal: Normal range of motion. She exhibits no edema or tenderness.  Lymphadenopathy:    She has no cervical adenopathy.  Neurological: She is alert and oriented to person, place, and time. No cranial nerve deficit. She exhibits normal muscle tone.  Skin: Skin is warm and dry. No rash noted. No erythema. No pallor.  Nursing note and vitals reviewed.   ED  Course  Procedures  DIAGNOSTIC STUDIES: Oxygen Saturation is 96% on RA, adequate by my interpretation.    COORDINATION OF CARE: 12:03 AM Discussed treatment plan with pt. Pt acknowledges and agrees to plan.   Labs Review Labs Reviewed  CBC WITH DIFFERENTIAL/PLATELET - Abnormal; Notable for the following:    RBC 3.83 (*)    Platelets 133 (*)    All other components within normal limits  BASIC METABOLIC PANEL - Abnormal; Notable for the following:    Glucose, Bld 128 (*)    BUN 32 (*)    Creatinine, Ser 2.20 (*)    GFR calc non Af Amer 19 (*)    GFR calc Af Amer 22 (*)    All other components within normal limits  I-STAT TROPOININ, ED    Imaging Review Dg Chest 2 View  07/30/2015   CLINICAL DATA:  81 year old female with shortness of breath and chest pain  EXAM: CHEST  2 VIEW  COMPARISON:  Radiograph dated 06/1915  FINDINGS: Two views of the chest demonstrates emphysematous changes of the lungs. Bibasilar linear and nodular densities appear similar to prior study and likely chronic or atelectatic. Pneumonia is not excluded. Clinical correlation and follow-up recommended. There is no focal consolidation, pleural effusion, or pneumothorax. Stable cardiac silhouette. The osseous structures are grossly unremarkable. Right pectoral pacemaker device noted  IMPRESSION: No active cardiopulmonary disease.  No interval change.   Electronically Signed   By: Anner Crete M.D.   On: 07/30/2015 00:50   I have personally reviewed and evaluated these images and lab results as part of my medical decision-making.   EKG Interpretation   Date/Time:  Thursday July 30 2015 00:04:56 EDT Ventricular Rate:  70 PR Interval:  192 QRS Duration: 155 QT Interval:  478 QTC Calculation: 516 R Axis:   120 Text Interpretation:  Atrial-ventricular dual-paced rhythm No further  analysis attempted due to paced rhythm No significant change since last  tracing Confirmed by Glynn Octave [phone]) on  07/30/2015 12:17:20  AM      MDM   Final diagnoses:  None   Patient presents to the ED for chest pain.  She describes as pressure and is concerning for ACS.  Initial troponin negative and EKG is paced rhythm, unchanged, and no scarbossa criteria seen.  Will admit to [facility], i spoke with dr. Posey Pronto.  Patient tells me she is chest pain free after nitro   I personally performed the services described in this documentation, which was scribed in my presence. The recorded information has been reviewed and is accurate.   Everlene Balls, MD 07/30/15 [phone]

## 2015-07-30 ENCOUNTER — Encounter: Payer: Self-pay | Admitting: Cardiology

## 2015-07-30 ENCOUNTER — Encounter (HOSPITAL_COMMUNITY): Payer: Self-pay | Admitting: Emergency Medicine

## 2015-07-30 ENCOUNTER — Emergency Department (HOSPITAL_COMMUNITY): Payer: Medicare Other

## 2015-07-30 DIAGNOSIS — Z7901 Long term (current) use of anticoagulants: Secondary | ICD-10-CM

## 2015-07-30 DIAGNOSIS — I480 Paroxysmal atrial fibrillation: Secondary | ICD-10-CM

## 2015-07-30 DIAGNOSIS — R079 Chest pain, unspecified: Secondary | ICD-10-CM | POA: Diagnosis not present

## 2015-07-30 DIAGNOSIS — D472 Monoclonal gammopathy: Secondary | ICD-10-CM

## 2015-07-30 DIAGNOSIS — I5042 Chronic combined systolic (congestive) and diastolic (congestive) heart failure: Secondary | ICD-10-CM

## 2015-07-30 DIAGNOSIS — I509 Heart failure, unspecified: Secondary | ICD-10-CM | POA: Insufficient documentation

## 2015-07-30 DIAGNOSIS — I482 Chronic atrial fibrillation: Secondary | ICD-10-CM | POA: Diagnosis not present

## 2015-07-30 DIAGNOSIS — N184 Chronic kidney disease, stage 4 (severe): Secondary | ICD-10-CM

## 2015-07-30 DIAGNOSIS — Z950 Presence of cardiac pacemaker: Secondary | ICD-10-CM

## 2015-07-30 DIAGNOSIS — R072 Precordial pain: Secondary | ICD-10-CM | POA: Diagnosis not present

## 2015-07-30 DIAGNOSIS — E785 Hyperlipidemia, unspecified: Secondary | ICD-10-CM

## 2015-07-30 DIAGNOSIS — E039 Hypothyroidism, unspecified: Secondary | ICD-10-CM

## 2015-07-30 DIAGNOSIS — I5032 Chronic diastolic (congestive) heart failure: Secondary | ICD-10-CM | POA: Diagnosis not present

## 2015-07-30 DIAGNOSIS — I4891 Unspecified atrial fibrillation: Secondary | ICD-10-CM

## 2015-07-30 LAB — BASIC METABOLIC PANEL
Anion gap: 8 (ref 5–15)
BUN: 32 mg/dL — ABNORMAL HIGH (ref 6–20)
CALCIUM: 9.2 mg/dL (ref 8.9–10.3)
CO2: 24 mmol/L (ref 22–32)
CREATININE: 2.2 mg/dL — AB (ref 0.44–1.00)
Chloride: 107 mmol/L (ref 101–111)
GFR calc non Af Amer: 19 mL/min — ABNORMAL LOW (ref >60)
GFR, EST AFRICAN AMERICAN: 22 mL/min — AB (ref >60)
Glucose, Bld: 128 mg/dL — ABNORMAL HIGH (ref 65–99)
Potassium: 5 mmol/L (ref 3.5–5.1)
SODIUM: 139 mmol/L (ref 135–145)

## 2015-07-30 LAB — COMPREHENSIVE METABOLIC PANEL
ALBUMIN: 3.4 g/dL — AB (ref 3.5–5.0)
ALK PHOS: 106 U/L (ref 38–126)
ALT: 15 U/L (ref 14–54)
AST: 17 U/L (ref 15–41)
Anion gap: 6 (ref 5–15)
BILIRUBIN TOTAL: 0.6 mg/dL (ref 0.3–1.2)
BUN: 31 mg/dL — AB (ref 6–20)
CALCIUM: 9.4 mg/dL (ref 8.9–10.3)
CO2: 29 mmol/L (ref 22–32)
Chloride: 105 mmol/L (ref 101–111)
Creatinine, Ser: 2.22 mg/dL — ABNORMAL HIGH (ref 0.44–1.00)
GFR calc Af Amer: 22 mL/min — ABNORMAL LOW (ref >60)
GFR calc non Af Amer: 19 mL/min — ABNORMAL LOW (ref >60)
GLUCOSE: 111 mg/dL — AB (ref 65–99)
Potassium: 4.9 mmol/L (ref 3.5–5.1)
Sodium: 140 mmol/L (ref 135–145)
TOTAL PROTEIN: 6.2 g/dL — AB (ref 6.5–8.1)

## 2015-07-30 LAB — CBC WITH DIFFERENTIAL/PLATELET
BASOS ABS: 0 10*3/uL (ref 0.0–0.1)
BASOS ABS: 0 10*3/uL (ref 0.0–0.1)
Basophils Relative: 0 %
Basophils Relative: 1 %
EOS ABS: 0.1 10*3/uL (ref 0.0–0.7)
EOS PCT: 2 %
Eosinophils Absolute: 0.1 10*3/uL (ref 0.0–0.7)
Eosinophils Relative: 1 %
HCT: 38 % (ref 36.0–46.0)
HCT: 38 % (ref 36.0–46.0)
HEMOGLOBIN: 12 g/dL (ref 12.0–15.0)
Hemoglobin: 12.4 g/dL (ref 12.0–15.0)
LYMPHS ABS: 1.2 10*3/uL (ref 0.7–4.0)
LYMPHS PCT: 19 %
Lymphocytes Relative: 20 %
Lymphs Abs: 1.1 10*3/uL (ref 0.7–4.0)
MCH: 31.4 pg (ref 26.0–34.0)
MCH: 32.4 pg (ref 26.0–34.0)
MCHC: 31.6 g/dL (ref 30.0–36.0)
MCHC: 32.6 g/dL (ref 30.0–36.0)
MCV: 99.2 fL (ref 78.0–100.0)
MCV: 99.5 fL (ref 78.0–100.0)
Monocytes Absolute: 0.5 10*3/uL (ref 0.1–1.0)
Monocytes Absolute: 0.5 10*3/uL (ref 0.1–1.0)
Monocytes Relative: 8 %
Monocytes Relative: 9 %
NEUTROS ABS: 4.4 10*3/uL (ref 1.7–7.7)
NEUTROS PCT: 71 %
Neutro Abs: 3.7 10*3/uL (ref 1.7–7.7)
Neutrophils Relative %: 68 %
PLATELETS: 132 10*3/uL — AB (ref 150–400)
PLATELETS: 133 10*3/uL — AB (ref 150–400)
RBC: 3.82 MIL/uL — AB (ref 3.87–5.11)
RBC: 3.83 MIL/uL — AB (ref 3.87–5.11)
RDW: 13.9 % (ref 11.5–15.5)
RDW: 14 % (ref 11.5–15.5)
WBC: 5.4 10*3/uL (ref 4.0–10.5)
WBC: 6.1 10*3/uL (ref 4.0–10.5)

## 2015-07-30 LAB — TROPONIN I
Troponin I: 0.03 ng/mL (ref <0.031)
Troponin I: 0.03 ng/mL (ref <0.031)
Troponin I: 0.03 ng/mL (ref <0.031)

## 2015-07-30 LAB — I-STAT TROPONIN, ED: TROPONIN I, POC: 0.04 ng/mL (ref 0.00–0.08)

## 2015-07-30 LAB — PROTIME-INR
INR: 1.44 (ref 0.00–1.49)
Prothrombin Time: 17.6 seconds — ABNORMAL HIGH (ref 11.6–15.2)

## 2015-07-30 MED ORDER — AMIODARONE HCL 200 MG PO TABS
200.0000 mg | ORAL_TABLET | Freq: Every day | ORAL | Status: DC
  Administered 2015-07-30: 200 mg via ORAL
  Filled 2015-07-30: qty 1

## 2015-07-30 MED ORDER — METOLAZONE 2.5 MG PO TABS
2.5000 mg | ORAL_TABLET | Freq: Once | ORAL | Status: AC
  Administered 2015-07-30: 2.5 mg via ORAL
  Filled 2015-07-30: qty 1

## 2015-07-30 MED ORDER — LORATADINE 10 MG PO TABS: 10.0000 mg | ORAL_TABLET | Freq: Every day | ORAL | Status: DC

## 2015-07-30 MED ORDER — ACETAMINOPHEN 325 MG PO TABS: 650.0000 mg | ORAL_TABLET | ORAL | Status: DC | PRN

## 2015-07-30 MED ORDER — ASPIRIN EC 81 MG PO TBEC
81.0000 mg | DELAYED_RELEASE_TABLET | Freq: Every day | ORAL | Status: DC
  Administered 2015-07-30: 81 mg via ORAL
  Filled 2015-07-30: qty 1

## 2015-07-30 MED ORDER — APIXABAN 2.5 MG PO TABS
2.5000 mg | ORAL_TABLET | Freq: Two times a day (BID) | ORAL | Status: DC
  Administered 2015-07-30: 2.5 mg via ORAL
  Filled 2015-07-30: qty 1

## 2015-07-30 MED ORDER — FERROUS SULFATE 325 (65 FE) MG PO TABS
325.0000 mg | ORAL_TABLET | Freq: Two times a day (BID) | ORAL | Status: DC
  Administered 2015-07-30: 325 mg via ORAL
  Filled 2015-07-30: qty 1

## 2015-07-30 MED ORDER — PANTOPRAZOLE SODIUM 40 MG PO TBEC
40.0000 mg | DELAYED_RELEASE_TABLET | Freq: Every day | ORAL | Status: DC
  Administered 2015-07-30: 40 mg via ORAL
  Filled 2015-07-30: qty 1

## 2015-07-30 MED ORDER — GI COCKTAIL ~~LOC~~: 30.0000 mL | Freq: Four times a day (QID) | ORAL | Status: DC | PRN

## 2015-07-30 MED ORDER — LEVOTHYROXINE SODIUM 75 MCG PO TABS
75.0000 ug | ORAL_TABLET | Freq: Every day | ORAL | Status: DC
  Administered 2015-07-30: 75 ug via ORAL
  Filled 2015-07-30: qty 1

## 2015-07-30 MED ORDER — SIMVASTATIN 10 MG PO TABS: 10.0000 mg | ORAL_TABLET | Freq: Every day | ORAL | Status: DC

## 2015-07-30 MED ORDER — TORSEMIDE 20 MG PO TABS
40.0000 mg | ORAL_TABLET | Freq: Every day | ORAL | Status: DC
  Administered 2015-07-30: 40 mg via ORAL
  Filled 2015-07-30: qty 2

## 2015-07-30 MED ORDER — HYDROCODONE-ACETAMINOPHEN 5-325 MG PO TABS
1.0000 | ORAL_TABLET | Freq: Four times a day (QID) | ORAL | Status: DC | PRN
  Administered 2015-07-30 (×2): 1 via ORAL
  Filled 2015-07-30 (×2): qty 1

## 2015-07-30 MED ORDER — ONDANSETRON HCL 4 MG/2ML IJ SOLN: 4.0000 mg | Freq: Four times a day (QID) | INTRAMUSCULAR | Status: DC | PRN

## 2015-07-30 NOTE — Progress Notes (Signed)
ANTICOAGULATION CONSULT NOTE - Initial Consult  Pharmacy Consult for Apixaban Indication: atrial fibrillation  Allergies  Allergen Reactions  . Other Shortness Of Breath    Detergents, perfumes and other strong odor emitting compounds  . Tramadol Shortness Of Breath and Nausea Only  . Ibuprofen Other (See Comments)    Told not to take med    Patient Measurements: Height: 4\' 10"  (147.3 cm) Weight: 137 lb 8 oz (62.37 kg) IBW/kg (Calculated) : 40.9 Heparin Dosing Weight:   Vital Signs: Temp: 98.4 F (36.9 C) (09/15 0010) Temp Source: Oral (09/15 0010) BP: 101/48 mmHg (09/15 0215) Pulse Rate: 70 (09/15 0215)  Labs:  Recent Labs  07/30/15 0016  HGB 12.4  HCT 38.0  PLT 133*  CREATININE 2.20*    Estimated Creatinine Clearance: 13.8 mL/min (by C-G formula based on Cr of 2.2).   Medical History: Past Medical History  Diagnosis Date  . Dyslipidemia   . Hypertension   . Palpitations     PVCs/bigeminy on event in May 2009 revealing this was relatively asymptomatic  . Chest pain     a. 2011 Neg MV;  b. 01/2014 Cath: LM 20-30, LAD nl, D1 nl, LCX nl, OM1 nl, RCA dom 30-102m, PD/PL nl, EF 65%->Med Rx.  . Degenerative joint disease   . Hx of varicella   . History of skin cancer     tafeen/ non melanoma.   . Thrombocytopenia   . Anemia   . Monoclonal gammopathies   . Right lower quadrant abdominal pain 07/19/2012    Recurrent  With nausea    This is new since she had ct for llq pain in [facility]    R/o appendiceal problem  Hernia  Get ct scan  And plan  Fu     . Diastolic CHF, acute on chronic 01/31/2012  . Atrial fibrillation     a. Dx 01/2014->Eliquis started.  . Cancer   . Pacemaker   . Dysrhythmia   . Family history of adverse reaction to anesthesia     " multiple family members have difficulty waking "  . GERD (gastroesophageal reflux disease)   . Hypothyroidism     Medications:   (Not in a hospital admission) Scheduled:  . amiodarone  200 mg Oral Daily  . aspirin  EC  81 mg Oral Daily  . ferrous sulfate  325 mg Oral BID WC  . levothyroxine  75 mcg Oral Daily  . loratadine  10 mg Oral QHS  . pantoprazole  40 mg Oral Daily  . simvastatin  10 mg Oral QHS   Infusions:    Assessment: 81yo female with history of Afib on apixaban presents with CP. Pharmacy is consulted to dose apixaban for paroxysmal atrial fibrillation. Hgb 12.4, Plt 133, sCr 2.2.  Goal of Therapy:  Monitor platelets by anticoagulation protocol: Yes   Plan:  Apixaban 2.5mg  PO BID Daily CBC Monitor s/sx of bleeding  Andrey Cota. Diona Foley, PharmD Clinical Pharmacist Pager [phone] 07/30/2015,2:24 AM

## 2015-07-30 NOTE — Care Management Note (Signed)
Case Management Note  Patient Details  Name: [name] MRN: [id number] Date of Birth: 1924/12/06  Subjective/Objective:         Admitted with Chest Pain           Action/Plan: Patient lives alone but has family members that live close by to assist in her care.She use a cane at home and is active with [facility] for [facility]PT as prior to admission. Mary with [facility] called to resume servics.  Expected Discharge Date:   07/30/2015               Expected Discharge Plan:  [place]     Discharge planning Services  CM Consult   Choice offered to:  Patient  HH Arranged:  PT [place]:  [facility]  Status of Service:  Completed, signed off  Sherrilyn Rist [phone] 07/30/2015, 3:04 PM

## 2015-07-30 NOTE — ED Notes (Signed)
Dr. Patel at bedside 

## 2015-07-30 NOTE — Discharge Summary (Signed)
Physician Discharge Summary  [name] FWY:[id number] DOB: [birth year] DOA: 07/29/2015  PCP: [name], MD  Admit date: 07/29/2015 Discharge date: 07/30/2015  Recommendations for Outpatient Follow-up:  1. Pt will need to follow up with PCP in 1 weeks post discharge 2. BMP in one week   Discharge Diagnoses:   atypical chest pain -Patient had cardiac catheterization March 2015 with minimal CAD -03/31/2015 Myoview--low risk, EF 73% -Finished cycle troponins--neg -Since hospitalization the patient has not had any chest discomfort or dyspnea -Review of the medical record reveals that the patient has had extensive cardiac workup as well as recurrent atypical chest discomfort -Suspect a degree of musculoskeletal strain versus anxiety -The patient remained hemodynamically stable without any tachycardia or hypoxemia -pt seen by cardiology who felt pt did not need further cardiac workup and cleared the patient for d/c Atrial fibrillation -Status post cardioversion -Continue amiodarone -Continue apixaban -rate controlled Thrombocytopenia -This is chronic back to October 2015 -No signs of active bleeding -due to MGUS Chronic systolic and diastolic CHF -Clinically compensated -03/18/2015 echo cardiac arrhythmia 45-50 percent of the 2 diastolic dysfunction -Restart home dose of torsemide 40 mg daily  -given dose of metolazone for weight >136 CKD stage IV  -Baseline creatinine 2. 0-2.3  Status post permanent pacemaker  -After cardioversion, the patient has symptomatically bradycardia  -Presently in sinus rhythm   Hypothyroidism  -Continue Synthroid 25 g daily  Hyperlipidemia  -Continue statin  MGUS -likely contributing to the patient's thrombocytopenia and anemia  -Outpatient follow-up with hematology   Discharge Condition: Stable  Disposition: Home  Diet:heart healthy Wt Readings from Last 3 Encounters:  07/30/15 63.231 kg (139 lb 6.4 oz)  07/18/15 63.957  kg (141 lb)  07/17/15 64.003 kg (141 lb 1.6 oz)    History of present illness:  81 year old female with a history of coronary artery disease, systolic and diastolic CHF, atrial fibrillation, thrombocytopenia, MGUS, hyperlipidemia presented with chest discomfort. The patient was watching TV around 10 PM on the evening of admission when she developed chest discomfort with some associated nausea. The patient stated that it was more of a pressure-like sensation that lasted about 30 minutes. As a result, the patient activated EMS. She was brought to the emergency department. Initial EKG showed a paced rhythm that was unchanged.  Troponins were unremarkable. Troponins were cycled and were minimally elevated which likely represented the patient's CKD in the setting of chronic systolic and diastolic CHF.  Throughout the hospitalization, the patient did not have any further chest discomfort, shortness of breath. She remained hemodynamically stable without any tachycardia or hypoxemia. Notably, the patient had heart catheterization in March 2015 which revealed nonobstructive CAD. Stress test on 03/31/2015 was low risk with EF 73%. It is thought that her atypical chest discomfort may be a component of musculoskeletal strain versus anxiety. For the patient's chronic systolic and diastolic CHF, the patient was slightly above her baseline weight. The patient was given a dose of metolazone in addition to her home dose of torsemide. The patient will follow up with Dr. Haroldine Laws the CHF clinic.   Discharge Exam: Filed Vitals:   07/30/15 1200  BP: 110/53  Pulse: 72  Temp: 97 F (36.1 C)  Resp: 18   Filed Vitals:   07/30/15 0215 07/30/15 0239 07/30/15 0956 07/30/15 1200  BP: 101/48 111/53 128/50 110/53  Pulse: 70 71  72  Temp:  97.5 F (36.4 C)  97 F (36.1 C)  TempSrc:  Oral  Oral  Resp: 16 18  18  Height:  4\' 10"  (1.473 m)    Weight:  63.231 kg (139 lb 6.4 oz)    SpO2: 91%   94%   General: A&O x 3,  NAD, pleasant, cooperative Cardiovascular: RRR, no rub, no gallop, no S3 Respiratory: fine bibasilar crackles without wheeze Abdomen:soft, nontender, nondistended, positive bowel sounds Extremities: trace edema, No lymphangitis, no petechiae  Discharge Instructions      Discharge Instructions    Diet - low sodium heart healthy    Complete by:  As directed      Increase activity slowly    Complete by:  As directed             Medication List    TAKE these medications        acetaminophen 325 MG tablet  Commonly known as:  TYLENOL  Take 2 tablets (650 mg total) by mouth every 4 (four) hours as needed for headache or mild pain.     amiodarone 200 MG tablet  Commonly known as:  PACERONE  Take 1 tablet (200 mg total) by mouth daily.     apixaban 2.5 MG Tabs tablet  Commonly known as:  ELIQUIS  Take 1 tablet (2.5 mg total) by mouth 2 (two) times daily.     erythromycin ophthalmic ointment  Place 1 application into both eyes 2 (two) times daily.     ferrous sulfate 325 (65 FE) MG tablet  TAKE ONE TABLET BY MOUTH TWICE DAILY WITH A MEAL     furosemide 20 MG tablet  Commonly known as:  LASIX  Take 20 mg by mouth 2 (two) times daily.     HYDROcodone-acetaminophen 5-325 MG per tablet  Commonly known as:  NORCO/VICODIN  Take 1 tablet by mouth every 6 (six) hours as needed for moderate pain.     levothyroxine 75 MCG tablet  Commonly known as:  SYNTHROID, LEVOTHROID  TAKE ONE TABLET BY MOUTH ONCE DAILY     loratadine 10 MG tablet  Commonly known as:  CLARITIN  Take 10 mg by mouth at bedtime.     metolazone 2.5 MG tablet  Commonly known as:  ZAROXOLYN  Take 1 tablet (2.5 mg total) by mouth as needed (for weight 136 lb or greater).     nitroGLYCERIN 0.4 MG SL tablet  Commonly known as:  NITROSTAT  Place 1 tablet (0.4 mg total) under the tongue every 5 (five) minutes as needed. For chest pain     oxyCODONE-acetaminophen 5-325 MG per tablet  Commonly known as:   PERCOCET/ROXICET  Take 0.5-1 tablets by mouth every 6 (six) hours as needed for severe pain.     pantoprazole 40 MG tablet  Commonly known as:  PROTONIX  TAKE ONE TABLET BY MOUTH ONCE DAILY     potassium chloride SA 20 MEQ tablet  Commonly known as:  K-DUR,KLOR-CON  Take 60 mEq by mouth daily.     simvastatin 10 MG tablet  Commonly known as:  ZOCOR  TAKE ONE TABLET BY MOUTH AT BEDTIME     torsemide 20 MG tablet  Commonly known as:  DEMADEX  Take 40 mg by mouth daily.     traMADol 50 MG tablet  Commonly known as:  ULTRAM  Take 0.5-1 tablets (25-50 mg total) by mouth every 8 (eight) hours as needed for moderate pain or severe pain.         The results of significant diagnostics from this hospitalization (including imaging, microbiology, ancillary and laboratory) are listed below for  reference.    Significant Diagnostic Studies: Ct Abdomen Pelvis Wo Contrast  07/18/2015   CLINICAL DATA:  81 year old female with abdominal pain.  EXAM: CT ABDOMEN AND PELVIS WITHOUT CONTRAST  TECHNIQUE: Multidetector CT imaging of the abdomen and pelvis was performed following the standard protocol without IV contrast.  COMPARISON:  04/2012 and prior CTs  FINDINGS: Please note that parenchymal abnormalities may be missed without intravenous contrast.  Lower chest:  Cardiomegaly and pacemaker leads noted.  Hepatobiliary: No significant hepatic abnormalities are identified. The patient is status post cholecystectomy. There is no evidence of biliary dilatation.  Pancreas: Unremarkable  Spleen: Unremarkable  Adrenals/Urinary Tract: Bilateral renal atrophy and a left extrarenal pelvis again noted. The adrenal glands and bladder are unremarkable.  Stomach/Bowel: Colonic diverticulosis noted without evidence of diverticulitis. There is no evidence of bowel obstruction or definite bowel wall thickening.  Vascular/Lymphatic: No enlarged lymph nodes or abdominal aortic aneurysm. Aortic atherosclerotic calcifications  are noted.  Reproductive: Unremarkable  Other: No free fluid or pneumoperitoneum.  Musculoskeletal: No acute or suspicious abnormalities identified. Lumbar spine scoliosis and degenerative changes are again noted.  IMPRESSION: No acute abnormalities.  Cardiomegaly, renal atrophy and colonic diverticulosis.   Electronically Signed   By: Margarette Canada M.D.   On: 07/18/2015 19:33   Dg Chest 2 View  07/30/2015   CLINICAL DATA:  81 year old female with shortness of breath and chest pain  EXAM: CHEST  2 VIEW  COMPARISON:  Radiograph dated 06/1915  FINDINGS: Two views of the chest demonstrates emphysematous changes of the lungs. Bibasilar linear and nodular densities appear similar to prior study and likely chronic or atelectatic. Pneumonia is not excluded. Clinical correlation and follow-up recommended. There is no focal consolidation, pleural effusion, or pneumothorax. Stable cardiac silhouette. The osseous structures are grossly unremarkable. Right pectoral pacemaker device noted  IMPRESSION: No active cardiopulmonary disease.  No interval change.   Electronically Signed   By: Anner Crete M.D.   On: 07/30/2015 00:50   Dg Chest 2 View  07/03/2015   CLINICAL DATA:  Left chest pain for 3 days.  EXAM: CHEST  2 VIEW  COMPARISON:  PA and lateral chest 05/26/2015 and 10/25/2014.  FINDINGS: Dual lead pacing device is in place, unchanged. There is cardiomegaly and mild vascular congestion. No consolidative process, pneumothorax or effusion. No focal bony abnormality.  IMPRESSION: No acute disease.  Mild cardiomegaly and vascular congestion.   Electronically Signed   By: Inge Rise M.D.   On: 07/03/2015 11:14     Microbiology: No results found for this or any previous visit (from the past 240 hour(s)).   Labs: Basic Metabolic Panel:  Recent Labs Lab 07/30/15 0016 07/30/15 0304  NA 139 140  K 5.0 4.9  CL 107 105  CO2 24 29  GLUCOSE 128* 111*  BUN 32* 31*  CREATININE 2.20* 2.22*  CALCIUM 9.2 9.4     Liver Function Tests:  Recent Labs Lab 07/30/15 0304  AST 17  ALT 15  ALKPHOS 106  BILITOT 0.6  PROT 6.2*  ALBUMIN 3.4*   No results for input(s): LIPASE, AMYLASE in the last 168 hours. No results for input(s): AMMONIA in the last 168 hours. CBC:  Recent Labs Lab 07/30/15 0016 07/30/15 0304  WBC 5.4 6.1  NEUTROABS 3.7 4.4  HGB 12.4 12.0  HCT 38.0 38.0  MCV 99.2 99.5  PLT 133* 132*   Cardiac Enzymes:  Recent Labs Lab 07/30/15 0304 07/30/15 0746  TROPONINI 0.03 0.03   BNP: Invalid  input(s): POCBNP CBG: No results for input(s): GLUCAP in the last 168 hours.  Time coordinating discharge:  Greater than 30 minutes  Signed:  Kyran Whittier, DO [facility] Pager: [phone] 07/30/2015, 2:51 PM

## 2015-07-30 NOTE — Progress Notes (Signed)
Pt a/o, no c/o pain, VSS, pt stable 

## 2015-07-30 NOTE — Progress Notes (Signed)
Pt being dc to home, instructions given pt stable

## 2015-07-30 NOTE — ED Notes (Signed)
The patient was at home sitting in the bed watching TV and started feeling chest heaviness with radiation to the mid lower back area.  The patient called EMS.  EMS placed an IV gave 324mg  of Aspirin, and one nitro.  The patient denies any other symptoms and rates her pain 6/10.

## 2015-07-30 NOTE — H&P (Signed)
[facility] History and Physical  Patient: [name]  MRN: [id number]  DOB: [birth year]  DOS: the patient was seen and examined on 07/30/2015 PCP: [name], MD  Referring physician: Dr. Joellyn Quails Chief Complaint: Chest pain  HPI: [name] is a 81 y.o. female with Past medical history of dyslipidemia, hypertension, coronary artery disease, chronic combined CHF, MGUS, chronic kidney disease, chronic anemia, hypothyroidism. The patient is presenting with complaints of chest pain. The pain is located centrally. It feels like tightness. It brought on initially on exertion and later on even at rest. At this was associated with shortness of breath as well as nausea and dizziness. She denies any fever or chills nausea vomiting the time of my evaluation no abdominal pain no diarrhea no constipation no burning urination. She denies any leg swelling orthopnea or PND. She denies any recent travel or surgery. She denies any recent change in her medication. She denies having similar pain in the past.  The patient is coming from home.  At her baseline ambulates with cane And is independent for most of her ADL; manages her medication on her own.  Review of Systems: as mentioned in the history of present illness.  A comprehensive review of the other systems is negative.  Past Medical History  Diagnosis Date  . Dyslipidemia   . Hypertension   . Palpitations     PVCs/bigeminy on event in May 2009 revealing this was relatively asymptomatic  . Chest pain     a. 2011 Neg MV;  b. 01/2014 Cath: LM 20-30, LAD nl, D1 nl, LCX nl, OM1 nl, RCA dom 30-28m, PD/PL nl, EF 65%->Med Rx.  . Degenerative joint disease   . Hx of varicella   . History of skin cancer     tafeen/ non melanoma.   . Thrombocytopenia   . Anemia   . Monoclonal gammopathies   . Right lower quadrant abdominal pain 07/19/2012    Recurrent  With nausea    This is new since she had ct for llq pain in [facility]    R/o  appendiceal problem  Hernia  Get ct scan  And plan  Fu     . Diastolic CHF, acute on chronic 01/31/2012  . Atrial fibrillation     a. Dx 01/2014->Eliquis started.  . Cancer   . Pacemaker   . Dysrhythmia   . Family history of adverse reaction to anesthesia     " multiple family members have difficulty waking "  . GERD (gastroesophageal reflux disease)   . Hypothyroidism    Past Surgical History  Procedure Laterality Date  . Cholecystectomy  1999  . Cesarean section      times 2  . Shoulder surgery  1996    Right   . Cataract extraction      Bilateral  implantt  . Cardioversion N/A 02/26/2014    Procedure: CARDIOVERSION AT BEDSIDE;  Surgeon: Pixie Casino, MD;  Location: [place];  Service: Cardiovascular;  Laterality: N/A;  . Cardioversion N/A 04/22/2014    Procedure: CARDIOVERSION (BEDSIDE);  Surgeon: [name], MD;  Location: [facility] OR;  Service: Cardiovascular;  Laterality: N/A;  . Pacemaker insertion  04/23/2014    STJ Assurity dual chamber pacemaker implanted by Dr Rayann Heman  . Insert / replace / remove pacemaker    . Esophagogastroduodenoscopy N/A 07/06/2014    Procedure: ESOPHAGOGASTRODUODENOSCOPY (EGD);  Surgeon: Ladene Artist, MD;  Location: [facility] ENDOSCOPY;  Service: Endoscopy;  Laterality: N/A;  . Left heart  catheterization with coronary angiogram N/A 01/29/2014    Procedure: LEFT HEART CATHETERIZATION WITH CORONARY ANGIOGRAM;  Surgeon: Blane Ohara, MD;  Location: [facility] CATH LAB;  Service: Cardiovascular;  Laterality: N/A;  . Permanent pacemaker insertion N/A 04/23/2014    Procedure: PERMANENT PACEMAKER INSERTION;  Surgeon: Evans Lance, MD;  Location: [facility] CATH LAB;  Service: Cardiovascular;  Laterality: N/A;  . Right heart catheterization N/A 09/22/2014    Procedure: RIGHT HEART CATH;  Surgeon: Jolaine Artist, MD;  Location: [facility] CATH LAB;  Service: Cardiovascular;  Laterality: N/A;   Social History:  reports that she has never smoked. She has never used smokeless tobacco. She  reports that she does not drink alcohol or use illicit drugs.  Allergies  Allergen Reactions  . Other Shortness Of Breath    Detergents, perfumes and other strong odor emitting compounds  . Tramadol Shortness Of Breath and Nausea Only  . Ibuprofen Other (See Comments)    Told not to take med    Family History  Problem Relation Age of Onset  . Coronary artery disease Father   . Heart disease Father   . Lung cancer    . Alzheimer's disease Mother   . Cancer Brother 24    lung cancer  . Heart attack Father     Prior to Admission medications   Medication Sig Start Date End Date Taking? Authorizing Provider  acetaminophen (TYLENOL) 325 MG tablet Take 2 tablets (650 mg total) by mouth every 4 (four) hours as needed for headache or mild pain. 09/26/14  Yes Isaiah Serge, NP  amiodarone (PACERONE) 200 MG tablet Take 1 tablet (200 mg total) by mouth daily. 11/06/14  Yes Jolaine Artist, MD  apixaban (ELIQUIS) 2.5 MG TABS tablet Take 1 tablet (2.5 mg total) by mouth 2 (two) times daily. 12/30/14  Yes Jolaine Artist, MD  erythromycin ophthalmic ointment Place 1 application into both eyes 2 (two) times daily. 05/19/15  Yes Burnis Medin, MD  ferrous sulfate 325 (65 FE) MG tablet TAKE ONE TABLET BY MOUTH TWICE DAILY WITH A MEAL 06/02/15  Yes Burnis Medin, MD  furosemide (LASIX) 20 MG tablet Take 20 mg by mouth 2 (two) times daily.   Yes Historical Provider, MD  HYDROcodone-acetaminophen (NORCO/VICODIN) 5-325 MG per tablet Take 1 tablet by mouth every 6 (six) hours as needed for moderate pain. 07/18/15  Yes Leo Grosser, MD  levothyroxine (SYNTHROID, LEVOTHROID) 75 MCG tablet TAKE ONE TABLET BY MOUTH ONCE DAILY 07/17/15  Yes Burnis Medin, MD  loratadine (CLARITIN) 10 MG tablet Take 10 mg by mouth at bedtime.    Yes Historical Provider, MD  metolazone (ZAROXOLYN) 2.5 MG tablet Take 1 tablet (2.5 mg total) by mouth as needed (for weight 136 lb or greater). 12/16/14  Yes Jolaine Artist, MD    nitroGLYCERIN (NITROSTAT) 0.4 MG SL tablet Place 1 tablet (0.4 mg total) under the tongue every 5 (five) minutes as needed. For chest pain 12/02/13  Yes Fay Records, MD  oxyCODONE-acetaminophen (PERCOCET/ROXICET) 5-325 MG per tablet Take 0.5-1 tablets by mouth every 6 (six) hours as needed for severe pain. 07/21/15  Yes Marin Olp, MD  pantoprazole (PROTONIX) 40 MG tablet TAKE ONE TABLET BY MOUTH ONCE DAILY 05/04/15  Yes Burnis Medin, MD  potassium chloride SA (K-DUR,KLOR-CON) 20 MEQ tablet Take 60 mEq by mouth daily.   Yes Historical Provider, MD  simvastatin (ZOCOR) 10 MG tablet TAKE ONE TABLET BY MOUTH AT BEDTIME 07/16/15  Yes Jolaine Artist, MD  torsemide (DEMADEX) 20 MG tablet Take 40 mg by mouth daily.   Yes Historical Provider, MD  traMADol (ULTRAM) 50 MG tablet Take 0.5-1 tablets (25-50 mg total) by mouth every 8 (eight) hours as needed for moderate pain or severe pain. 07/17/15  Yes Burnis Medin, MD    Physical Exam: Filed Vitals:   07/30/15 0100 07/30/15 0115 07/30/15 0145 07/30/15 0215  BP: 100/44 92/52 105/47 101/48  Pulse: 70 70 70 70  Temp:      TempSrc:      Resp: 19 15 18 16   Height:      Weight:      SpO2: 91% 91% 94% 91%    General: Alert, Awake and Oriented to Time, Place and Person. Appear in mild distress Eyes: PERRL ENT: Oral Mucosa clear moist. Neck: no JVD Cardiovascular: S1 and S2 Present, aortic systolic Murmur, Peripheral Pulses Present Respiratory: Bilateral Air entry equal and Decreased,  Clear to Auscultation, no Crackles, no wheezes Abdomen: Bowel Sound present, Soft and no tenderness Skin: no Rash Extremities: no Pedal edema, no calf tenderness Neurologic: Grossly no focal neuro deficit.  Labs on Admission:  CBC:  Recent Labs Lab 07/30/15 0016  WBC 5.4  NEUTROABS 3.7  HGB 12.4  HCT 38.0  MCV 99.2  PLT 133*    CMP     Component Value Date/Time   NA 139 07/30/2015 0016   NA 142 07/30/2013 1528   K 5.0 07/30/2015 0016   K 4.3  07/30/2013 1528   CL 107 07/30/2015 0016   CL 107 01/11/2013 1248   CO2 24 07/30/2015 0016   CO2 28 07/30/2013 1528   GLUCOSE 128* 07/30/2015 0016   GLUCOSE 131 07/30/2013 1528   GLUCOSE 94 01/11/2013 1248   BUN 32* 07/30/2015 0016   BUN 21.4 07/30/2013 1528   CREATININE 2.20* 07/30/2015 0016   CREATININE 1.0 07/30/2013 1528   CALCIUM 9.2 07/30/2015 0016   CALCIUM 9.9 07/30/2013 1528   PROT 7.0 07/17/2015 1214   PROT 6.6 07/30/2013 1528   ALBUMIN 3.9 07/17/2015 1214   ALBUMIN 3.3* 07/30/2013 1528   AST 16 07/17/2015 1214   AST 15 07/30/2013 1528   ALT 12 07/17/2015 1214   ALT 12 07/30/2013 1528   ALKPHOS 102 07/17/2015 1214   ALKPHOS 89 07/30/2013 1528   BILITOT 0.6 07/17/2015 1214   BILITOT 0.58 07/30/2013 1528   GFRNONAA 19* 07/30/2015 0016   GFRAA 22* 07/30/2015 0016    No results for input(s): CKTOTAL, CKMB, CKMBINDEX, TROPONINI in the last 168 hours. BNP (last 3 results)  Recent Labs  02/13/15 0530 03/31/15 0142 05/09/15 1529  BNP 377.9* 442.4* 294.6*    ProBNP (last 3 results)  Recent Labs  08/27/14 1147 09/18/14 1317 10/25/14 0955  PROBNP 914.0* 1874.0* 13191.0*     Radiological Exams on Admission: Dg Chest 2 View  07/30/2015   CLINICAL DATA:  81 year old female with shortness of breath and chest pain  EXAM: CHEST  2 VIEW  COMPARISON:  Radiograph dated 06/1915  FINDINGS: Two views of the chest demonstrates emphysematous changes of the lungs. Bibasilar linear and nodular densities appear similar to prior study and likely chronic or atelectatic. Pneumonia is not excluded. Clinical correlation and follow-up recommended. There is no focal consolidation, pleural effusion, or pneumothorax. Stable cardiac silhouette. The osseous structures are grossly unremarkable. Right pectoral pacemaker device noted  IMPRESSION: No active cardiopulmonary disease.  No interval change.   Electronically Signed   By:  Anner Crete M.D.   On: 07/30/2015 00:50   EKG:  Independently reviewed. normal sinus rhythm, nonspecific ST and T waves changes.  Assessment/Plan 1. Chest pain The patient presents with chest pain her initial EKG and troponin does not show any signs of acute ischemia. her chest x-ray is showing no acute abnormality. At present I will admit the patient to the hospital for observation. I will obtain serial troponin every 6 hours, monitor on telemetry, get 2D echocardiogram in the morning to rule out ACS.   2.Hyperlipidemia Continue home simvastatin.  3.Hypothyroidism Continue home Synthroid.  4. MGUS (monoclonal gammopathy of unknown significance) Chronic anemia. Most likely associated with MGUS. We'll continue to closely monitor. Also associated with chronic kidney disease.  4. Atrial fibrillation Currently rate controlled but still in A. fib. Will continue anticoagulation at home doses.  5.Chronic combined systolic and diastolic CHF (congestive heart failure) patient currently appears to be at her baseline in terms of volume status. We will continue to closely monitor ins and outs.  6.Chronic anticoagulation Apixaban per pharmacy.  7.Pacemaker ICD implanted. Patient mentions her heart" he ranges up to AVAILABLE 120 at home. Currently her heart rate is stable. We will continue to monitor her on telemetry.  8.CKD (chronic kidney disease) stage 4, GFR 15-29 ml/min Avoid nephrotoxic medications. Renal dozing off all the medications.  Nutrition: Nothing by mouth. DVT Prophylaxis: on therapeutic anticoagulation.  Advance goals of care discussion: Partial code. The patient does not want to be intubated but would like to be resuscitated.  Consults: Cardiology  Disposition: Admitted as observation, telemetry unit. Estimated length of stay: 1-2 today  Author: Berle Mull, MD [facility] Pager: [phone] 07/30/2015  If 7PM-7AM, please contact night-coverage [email or web address] Password [email or web address]

## 2015-07-30 NOTE — Consult Note (Signed)
Patient ID: [name] MRN: [id number] DOB/AGE: 79/09/1925 79 y.o.  Admit date: 07/29/2015 Referring Physician: Tat Primary Cardiologist: Harrington Challenger. (She is also followed in the CHF clinic) Reason for Consultation: Chest pain  HPI: 81 yo female with history of non-obstructive CAD, HLD, HTN, diastolic CHF, PAF admitted with chest pain. She is followed in our office by Dr. Harrington Challenger for her CAD and PAF. She is known to have CAD with last cath March 2015 demonstrating mild non-obstructive disease. She is also followed in the CHF clinic for diastolic CHF. She has had multiple admissions over the last few years with volume overload. She has been on torsemide at home.Patient was admitted 04/2014 x2 with acute on chronic diastolic CHF. She underwent cardioversion with restoration of NSR. This resulted in profound bradycardia and hypotension requiring pacemaker implantation. Pacemaker implant was complicated by acute blood loss anemia requiring transfusion with PRBCs. Admitted to [facility] August 2015 with increased dyspnea. Diuresed with IV lasix and transitioned to po lasix 40 mg twice a day. GI evaluated due to anemia and black stool. Had EGD with no acute findings. Colonoscopy was cancelled per daughter due to multiple medical problems. She continued on eliquis 2.5 mg twice a day. Palliative Care consulted and the patient and family request aggressive care for heart failure. Admitted again with CHF in November 2015, December 2015, January 2015, March 2016. Diuresis limited each time by hypotension. Admitted May 2016 with chest pain. Stress myoview showed normal EF > 65% and no ischemia. She was again admitted 05/09/15 for chest pain. Demedex decreased to 30 mg daily with worsening creatinine. Troponin flat and without trend. D/c weight 133 lbs.   She is now presenting with one episode of chest pressure. This is substernal and only lasted for several minutes. No radiation of pain. No worsening of breathing. Weight  is near her baseline. No increase in LE edema. No chest pain this am. Troponin negative.    Past Medical History  Diagnosis Date  . Dyslipidemia   . Hypertension   . Palpitations     PVCs/bigeminy on event in May 2009 revealing this was relatively asymptomatic  . Chest pain     a. 2011 Neg MV;  b. 01/2014 Cath: LM 20-30, LAD nl, D1 nl, LCX nl, OM1 nl, RCA dom 30-52m, PD/PL nl, EF 65%->Med Rx.  . Degenerative joint disease   . Hx of varicella   . History of skin cancer     tafeen/ non melanoma.   . Thrombocytopenia   . Anemia   . Monoclonal gammopathies   . Right lower quadrant abdominal pain 07/19/2012    Recurrent  With nausea    This is new since she had ct for llq pain in [facility]    R/o appendiceal problem  Hernia  Get ct scan  And plan  Fu     . Diastolic CHF, acute on chronic 01/31/2012  . Atrial fibrillation     a. Dx 01/2014->Eliquis started.  . Cancer   . Pacemaker   . Dysrhythmia   . Family history of adverse reaction to anesthesia     " multiple family members have difficulty waking "  . GERD (gastroesophageal reflux disease)   . Hypothyroidism     Family History  Problem Relation Age of Onset  . Coronary artery disease Father   . Heart disease Father   . Lung cancer    . Alzheimer's disease Mother   . Cancer Brother 58    lung  cancer  . Heart attack Father     Social History   Social History  . Marital Status: Widowed    Spouse Name: N/A  . Number of Children: 3  . Years of Education: N/A   Occupational History  .      [employer], book keeping   Social History Main Topics  . Smoking status: Never Smoker   . Smokeless tobacco: Never Used  . Alcohol Use: No  . Drug Use: No  . Sexual Activity: No   Other Topics Concern  . 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 Eats with her.      Daughter handles medications. Sees HH and PT once a week.    Past Surgical History  Procedure Laterality Date  . Cholecystectomy  1999  . Cesarean section      times 2  . Shoulder surgery  1996    Right   . Cataract extraction      Bilateral  implantt  . Cardioversion N/A 02/26/2014    Procedure: CARDIOVERSION AT BEDSIDE;  Surgeon: Pixie Casino, MD;  Location: [place];  Service: Cardiovascular;  Laterality: N/A;  . Cardioversion N/A 04/22/2014    Procedure: CARDIOVERSION (BEDSIDE);  Surgeon: [name], MD;  Location: [facility] OR;  Service: Cardiovascular;  Laterality: N/A;  . Pacemaker insertion  04/23/2014    STJ Assurity dual chamber pacemaker implanted by Dr Rayann Heman  . Insert / replace / remove pacemaker    . Esophagogastroduodenoscopy N/A 07/06/2014    Procedure: ESOPHAGOGASTRODUODENOSCOPY (EGD);  Surgeon: Ladene Artist, MD;  Location: [facility] ENDOSCOPY;  Service: Endoscopy;  Laterality: N/A;  . Left heart catheterization with coronary angiogram N/A 01/29/2014    Procedure: LEFT HEART CATHETERIZATION WITH CORONARY ANGIOGRAM;  Surgeon: Blane Ohara, MD;  Location: [facility] CATH LAB;  Service: Cardiovascular;  Laterality: N/A;  . Permanent pacemaker insertion N/A 04/23/2014    Procedure: PERMANENT PACEMAKER INSERTION;  Surgeon: Evans Lance, MD;  Location: [facility] CATH LAB;  Service: Cardiovascular;  Laterality: N/A;  . Right heart catheterization N/A 09/22/2014    Procedure: RIGHT HEART CATH;  Surgeon: Jolaine Artist, MD;  Location: [facility] CATH LAB;  Service: Cardiovascular;  Laterality: N/A;    Allergies  Allergen Reactions  . Other Shortness Of Breath    Detergents, perfumes and other strong odor emitting compounds  . Tramadol Shortness Of Breath and Nausea Only  . Ibuprofen Other (See Comments)    Told not to take med   Hospital Medications:  . amiodarone  200 mg Oral Daily  . apixaban  2.5 mg Oral BID  . aspirin EC  81  mg Oral Daily  . ferrous sulfate  325 mg Oral BID WC  . levothyroxine  75 mcg Oral Daily  . loratadine  10 mg Oral QHS  . metolazone  2.5 mg Oral Once  . pantoprazole  40 mg Oral Daily  . simvastatin  10 mg Oral QHS  . torsemide  40 mg Oral Daily    Review of systems complete and found to be negative unless listed above   Physical Exam: Blood pressure 128/50, pulse 71, temperature 97.5  F (36.4 C), temperature source Oral, resp. rate 18, height 4\' 10"  (1.473 m), weight 139 lb 6.4 oz (63.231 kg), SpO2 91 %.    General: Well developed, well nourished, NAD  HEENT: OP clear, mucus membranes moist  SKIN: warm, dry. No rashes.  Neuro: No focal deficits  Musculoskeletal: Muscle strength 5/5 all ext  Psychiatric: Mood and affect normal  Neck: No JVD, no carotid bruits, no thyromegaly, no lymphadenopathy.  Lungs:Clear bilaterally, no wheezes, rhonci, crackles  Cardiovascular: Regular rate and rhythm. No murmurs, gallops or rubs.  Abdomen:Soft. Bowel sounds present. Non-tender.  Extremities: No lower extremity edema. Pulses are 2 + in the bilateral DP/PT.  Labs:   Lab Results  Component Value Date   WBC 6.1 07/30/2015   HGB 12.0 07/30/2015   HCT 38.0 07/30/2015   MCV 99.5 07/30/2015   PLT 132* 07/30/2015    Recent Labs Lab 07/30/15 0304  NA 140  K 4.9  CL 105  CO2 29  BUN 31*  CREATININE 2.22*  CALCIUM 9.4  PROT 6.2*  BILITOT 0.6  ALKPHOS 106  ALT 15  AST 17  GLUCOSE 111*   Lab Results  Component Value Date             TROPONINI 0.03 07/30/2015    Chest x-ray:  Two views of the chest demonstrates emphysematous changes of the lungs. Bibasilar linear and nodular densities appear similar to prior study and likely chronic or atelectatic. Pneumonia is not excluded. Clinical correlation and follow-up recommended. There is no focal consolidation, pleural effusion, or pneumothorax. Stable cardiac silhouette. The osseous structures are grossly unremarkable. Right  pectoral pacemaker device noted IMPRESSION: No active cardiopulmonary disease. No interval change.  EKG: AV paced.   ASSESSMENT AND PLAN:   1. Chest pain: her pain is atypical. No recurrence. No evidence of ACS. Troponin negative. She had a normal stress test in may 2016 and cardiac cath march 2015 with minimal CAD. Would not suggest further ischemic workup.   2. Chronic diastolic CHF: Weight near baseline. Continue torsemide and f/u in CHF clinic as planned.   3. PAF: sinus. She is on amiodarone and Eliquis. No changes.   OK to discharge home today.    Signed: Lauree Chandler, MD 07/30/2015, 10:25 AM

## 2015-07-30 NOTE — Progress Notes (Addendum)
Patient transferred from the ED to room 3E08. CMT called by charge nurse to verify placement of leads and telemetry box on the patient. VSS. Currently patient does not complain of chest pain but complains of pain located on the sid of abdomen. Will administer PRN hydrocodone as ordered and continue to monitor patient to end of shift.

## 2015-07-31 ENCOUNTER — Telehealth: Payer: Self-pay | Admitting: *Deleted

## 2015-07-31 MED ORDER — OXYCODONE-ACETAMINOPHEN 5-325 MG PO TABS: 0.5000 | ORAL_TABLET | Freq: Four times a day (QID) | ORAL | Status: DC | PRN

## 2015-07-31 NOTE — Telephone Encounter (Signed)
Need more information about what her dx and status is. She was just diced from hospital.for non cardiac chest pain.   Has she been getting  PT before this hospitalization?  Did she see the ortho as referred by Dr Yong Channel in  Sept 6th  For prob MS pain?

## 2015-07-31 NOTE — Telephone Encounter (Signed)
Transition Care Management Follow-up Telephone Call  How have you been since you were released from the hospital? Patient is doing "okay", but still has some back pain.   Do you understand why you were in the hospital? yes   Do you understand the discharge instrcutions? yes  Items Reviewed:  Medications reviewed: yes - patient has started her prednisone  Allergies reviewed: yes  Dietary changes reviewed: yes  Referrals reviewed: yes   Functional Questionnaire:   Activities of Daily Living (ADLs):   She states they are independent in the following: ambulation, bathing and hygiene, feeding, continence, grooming, toileting and dressing - use of cain States they require assistance with the following: none   Any transportation issues/concerns?: yes   Any patient concerns? Yes patient is still having some back pain   Confirmed importance and date/time of follow-up visits scheduled: yes   Confirmed with patient if condition begins to worsen call PCP or go to the ER.  Patient was given the Call-a-Nurse line [phone]: yes Patient was discharged 07/29/15 Patient was discharged to home Patient has an

## 2015-07-31 NOTE — Telephone Encounter (Signed)
Patient has an appointment on 08-04-15 with Dr Regis Bill.

## 2015-07-31 NOTE — Telephone Encounter (Signed)
Why hasnt she seen ortho? This was the plan for follow up on those issues. That is most important thing but you can giver her #5 of perocet I gave her at last visit. I will sign

## 2015-07-31 NOTE — Telephone Encounter (Signed)
Rx ready for pick up and daughter is aware.  The patient has called to schedule an appointment with Dr Tomi Likens.

## 2015-07-31 NOTE — Telephone Encounter (Signed)
Patient was discharged from the hospital 07/30/15 and has an app ointment with Dr Regis Bill 08/04/15.  Patient is still having back pain and would like to know if she can have a refill of her pain medication until her appointment?

## 2015-08-03 ENCOUNTER — Other Ambulatory Visit (HOSPITAL_COMMUNITY): Payer: Self-pay | Admitting: *Deleted

## 2015-08-03 MED ORDER — APIXABAN 2.5 MG PO TABS: 2.5000 mg | ORAL_TABLET | Freq: Two times a day (BID) | ORAL | Status: DC

## 2015-08-04 ENCOUNTER — Encounter: Payer: Self-pay | Admitting: Family Medicine

## 2015-08-04 ENCOUNTER — Ambulatory Visit: Payer: Medicare Other | Admitting: Internal Medicine

## 2015-08-05 NOTE — Telephone Encounter (Signed)
Spoke to [employer].  She stated that orders have been received by another physician.  No further action is needed.

## 2015-08-06 ENCOUNTER — Telehealth: Payer: Self-pay | Admitting: Internal Medicine

## 2015-08-06 NOTE — Telephone Encounter (Signed)
Patient canceled her appt on setp 20 be cause "she was in a lot of pain " Please contact patient /daughter about this,.and how we can help    Did she see the orthopedist about the cause of her pain 9 see previous notes etc  Pre hospital.

## 2015-08-06 NOTE — Telephone Encounter (Signed)
Pt is still having back and hip pain and would like another rx for oxycodone

## 2015-08-06 NOTE — Telephone Encounter (Signed)
Spoke to the [name] and advised that she will need to call Dr. Charlestine Night office for pain management.  Gave her the telephone number listed in Care Teams.

## 2015-08-07 NOTE — Telephone Encounter (Signed)
I spoke with the patient and she went to the orthopedist last week.  They did x-rays but did they were normal.  She does have a prescription for inflammation, but she is still in pain.  Patient states that she is going to call her orthopedist for another appointment.

## 2015-08-10 ENCOUNTER — Telehealth: Payer: Self-pay | Admitting: Internal Medicine

## 2015-08-10 ENCOUNTER — Other Ambulatory Visit: Payer: Self-pay | Admitting: Orthopedic Surgery

## 2015-08-10 DIAGNOSIS — M549 Dorsalgia, unspecified: Secondary | ICD-10-CM

## 2015-08-10 NOTE — Telephone Encounter (Signed)
Ok but  Report to [facility] that ortho should be seeing  Her for her  Ms pain .

## 2015-08-10 NOTE — Telephone Encounter (Signed)
Please advise 

## 2015-08-10 NOTE — Telephone Encounter (Signed)
Debbie physical therapist is  needing verbal order for physical therapy twice a wk for 4 wks for this patient.

## 2015-08-12 ENCOUNTER — Emergency Department (HOSPITAL_COMMUNITY): Payer: Medicare Other

## 2015-08-12 ENCOUNTER — Emergency Department (HOSPITAL_COMMUNITY)
Admission: EM | Admit: 2015-08-12 | Discharge: 2015-08-12 | Disposition: A | Discharge status: Home / Self Care | Payer: Medicare Other | Attending: Emergency Medicine | Admitting: Emergency Medicine

## 2015-08-12 ENCOUNTER — Encounter (HOSPITAL_COMMUNITY): Payer: Self-pay | Admitting: *Deleted

## 2015-08-12 DIAGNOSIS — Z79899 Other long term (current) drug therapy: Secondary | ICD-10-CM | POA: Insufficient documentation

## 2015-08-12 DIAGNOSIS — Z9889 Other specified postprocedural states: Secondary | ICD-10-CM | POA: Diagnosis not present

## 2015-08-12 DIAGNOSIS — R079 Chest pain, unspecified: Secondary | ICD-10-CM | POA: Diagnosis present

## 2015-08-12 DIAGNOSIS — Z950 Presence of cardiac pacemaker: Secondary | ICD-10-CM | POA: Insufficient documentation

## 2015-08-12 DIAGNOSIS — Z8619 Personal history of other infectious and parasitic diseases: Secondary | ICD-10-CM | POA: Diagnosis not present

## 2015-08-12 DIAGNOSIS — Z792 Long term (current) use of antibiotics: Secondary | ICD-10-CM | POA: Diagnosis not present

## 2015-08-12 DIAGNOSIS — I5033 Acute on chronic diastolic (congestive) heart failure: Secondary | ICD-10-CM | POA: Diagnosis not present

## 2015-08-12 DIAGNOSIS — M25559 Pain in unspecified hip: Secondary | ICD-10-CM | POA: Diagnosis not present

## 2015-08-12 DIAGNOSIS — E039 Hypothyroidism, unspecified: Secondary | ICD-10-CM | POA: Diagnosis not present

## 2015-08-12 DIAGNOSIS — K219 Gastro-esophageal reflux disease without esophagitis: Secondary | ICD-10-CM | POA: Insufficient documentation

## 2015-08-12 DIAGNOSIS — R110 Nausea: Secondary | ICD-10-CM | POA: Insufficient documentation

## 2015-08-12 DIAGNOSIS — E785 Hyperlipidemia, unspecified: Secondary | ICD-10-CM | POA: Diagnosis not present

## 2015-08-12 DIAGNOSIS — D649 Anemia, unspecified: Secondary | ICD-10-CM | POA: Insufficient documentation

## 2015-08-12 DIAGNOSIS — I4891 Unspecified atrial fibrillation: Secondary | ICD-10-CM | POA: Insufficient documentation

## 2015-08-12 DIAGNOSIS — R42 Dizziness and giddiness: Secondary | ICD-10-CM

## 2015-08-12 DIAGNOSIS — R0789 Other chest pain: Secondary | ICD-10-CM | POA: Insufficient documentation

## 2015-08-12 DIAGNOSIS — I10 Essential (primary) hypertension: Secondary | ICD-10-CM | POA: Insufficient documentation

## 2015-08-12 DIAGNOSIS — Z862 Personal history of diseases of the blood and blood-forming organs and certain disorders involving the immune mechanism: Secondary | ICD-10-CM | POA: Insufficient documentation

## 2015-08-12 DIAGNOSIS — R531 Weakness: Secondary | ICD-10-CM | POA: Insufficient documentation

## 2015-08-12 DIAGNOSIS — I951 Orthostatic hypotension: Secondary | ICD-10-CM | POA: Insufficient documentation

## 2015-08-12 DIAGNOSIS — Z85828 Personal history of other malignant neoplasm of skin: Secondary | ICD-10-CM | POA: Insufficient documentation

## 2015-08-12 DIAGNOSIS — Z7901 Long term (current) use of anticoagulants: Secondary | ICD-10-CM | POA: Insufficient documentation

## 2015-08-12 LAB — CBC WITH DIFFERENTIAL/PLATELET
BASOS ABS: 0 10*3/uL (ref 0.0–0.1)
Basophils Relative: 0 %
Eosinophils Absolute: 0.1 10*3/uL (ref 0.0–0.7)
Eosinophils Relative: 1 %
HEMATOCRIT: 38.1 % (ref 36.0–46.0)
Hemoglobin: 12 g/dL (ref 12.0–15.0)
LYMPHS ABS: 0.5 10*3/uL — AB (ref 0.7–4.0)
LYMPHS PCT: 7 %
MCH: 31.3 pg (ref 26.0–34.0)
MCHC: 31.5 g/dL (ref 30.0–36.0)
MCV: 99.5 fL (ref 78.0–100.0)
MONO ABS: 0.4 10*3/uL (ref 0.1–1.0)
Monocytes Relative: 5 %
NEUTROS ABS: 7.1 10*3/uL (ref 1.7–7.7)
Neutrophils Relative %: 87 %
Platelets: 129 10*3/uL — ABNORMAL LOW (ref 150–400)
RBC: 3.83 MIL/uL — AB (ref 3.87–5.11)
RDW: 14.3 % (ref 11.5–15.5)
WBC: 8.2 10*3/uL (ref 4.0–10.5)

## 2015-08-12 LAB — BASIC METABOLIC PANEL
Anion gap: 7 (ref 5–15)
BUN: 43 mg/dL — ABNORMAL HIGH (ref 6–20)
CHLORIDE: 108 mmol/L (ref 101–111)
CO2: 25 mmol/L (ref 22–32)
Calcium: 8.9 mg/dL (ref 8.9–10.3)
Creatinine, Ser: 2.56 mg/dL — ABNORMAL HIGH (ref 0.44–1.00)
GFR calc non Af Amer: 16 mL/min — ABNORMAL LOW (ref >60)
GFR, EST AFRICAN AMERICAN: 18 mL/min — AB (ref >60)
Glucose, Bld: 106 mg/dL — ABNORMAL HIGH (ref 65–99)
POTASSIUM: 4.3 mmol/L (ref 3.5–5.1)
SODIUM: 140 mmol/L (ref 135–145)

## 2015-08-12 LAB — I-STAT TROPONIN, ED
TROPONIN I, POC: 0.02 ng/mL (ref 0.00–0.08)
Troponin i, poc: 0.04 ng/mL (ref 0.00–0.08)

## 2015-08-12 MED ORDER — OXYCODONE-ACETAMINOPHEN 5-325 MG PO TABS
1.0000 | ORAL_TABLET | Freq: Once | ORAL | Status: AC
  Administered 2015-08-12: 1 via ORAL
  Filled 2015-08-12: qty 1

## 2015-08-12 MED ORDER — SODIUM CHLORIDE 0.9 % IV BOLUS (SEPSIS)
500.0000 mL | Freq: Once | INTRAVENOUS | Status: AC
  Administered 2015-08-12: 500 mL via INTRAVENOUS

## 2015-08-12 NOTE — ED Notes (Signed)
Pt up to bedside commode.  She tolerated well

## 2015-08-12 NOTE — ED Provider Notes (Signed)
CSN: [id number]     Arrival date & time 08/12/15  1600 History   First MD Initiated Contact with Patient 08/12/15 1619     Chief Complaint  Patient presents with  . Hip Pain     (Consider location/radiation/quality/duration/timing/severity/associated sxs/prior Treatment) HPI 81 year old female who presents with near syncope and chest pain. She has a history of hypertension, atrial fibrillation, and pacemaker placement. Although her chief complaints states that she is here for hip pain and abdominal pain, she denies having abdominal pain, and reports a long-standing history of hip pain and not the reason why she came today. Reports that while getting up from her chair today she felt very lightheaded, weak, and unwell. She developed left sided chest pressure with nausea. She has never had this sensation before, which concerned her. She sat down, and her lightheadedness resolved but had persistent left-sided chest pressure so called EMS. Of note she was recently hospitalized September 14-15 for evaluation of atypical chest pain. Had a negative rule out at that time and cardiology did clear her from additional testing. She had a recent stress test in May 2016 that was otherwise unremarkable per cardiology.  She reports being in her usual state of health otherwise. Denies recent illness, nausea, vomiting, diarrhea, abdominal pain, cough, difficulty breathing lower extremity edema orthopnea or PND. She has been eating and drinking normally.   Past Medical History  Diagnosis Date  . Dyslipidemia   . Hypertension   . Palpitations     PVCs/bigeminy on event in May 2009 revealing this was relatively asymptomatic  . Chest pain     a. 2011 Neg MV;  b. 01/2014 Cath: LM 20-30, LAD nl, D1 nl, LCX nl, OM1 nl, RCA dom 30-34m, PD/PL nl, EF 65%->Med Rx.  . Degenerative joint disease   . Hx of varicella   . History of skin cancer     tafeen/ non melanoma.   . Thrombocytopenia   . Anemia   . Monoclonal  gammopathies   . Right lower quadrant abdominal pain 07/19/2012    Recurrent  With nausea    This is new since she had ct for llq pain in [facility]    R/o appendiceal problem  Hernia  Get ct scan  And plan  Fu     . Diastolic CHF, acute on chronic 01/31/2012  . Atrial fibrillation     a. Dx 01/2014->Eliquis started.  . Cancer   . Pacemaker   . Dysrhythmia   . Family history of adverse reaction to anesthesia     " multiple family members have difficulty waking "  . GERD (gastroesophageal reflux disease)   . Hypothyroidism    Past Surgical History  Procedure Laterality Date  . Cholecystectomy  1999  . Cesarean section      times 2  . Shoulder surgery  1996    Right   . Cataract extraction      Bilateral  implantt  . Cardioversion N/A 02/26/2014    Procedure: CARDIOVERSION AT BEDSIDE;  Surgeon: Pixie Casino, MD;  Location: [place];  Service: Cardiovascular;  Laterality: N/A;  . Cardioversion N/A 04/22/2014    Procedure: CARDIOVERSION (BEDSIDE);  Surgeon: [name], MD;  Location: [facility] OR;  Service: Cardiovascular;  Laterality: N/A;  . Pacemaker insertion  04/23/2014    STJ Assurity dual chamber pacemaker implanted by Dr Rayann Heman  . Insert / replace / remove pacemaker    . Esophagogastroduodenoscopy N/A 07/06/2014    Procedure: ESOPHAGOGASTRODUODENOSCOPY (EGD);  Surgeon:  Ladene Artist, MD;  Location: [facility] ENDOSCOPY;  Service: Endoscopy;  Laterality: N/A;  . Left heart catheterization with coronary angiogram N/A 01/29/2014    Procedure: LEFT HEART CATHETERIZATION WITH CORONARY ANGIOGRAM;  Surgeon: Blane Ohara, MD;  Location: [facility] CATH LAB;  Service: Cardiovascular;  Laterality: N/A;  . Permanent pacemaker insertion N/A 04/23/2014    Procedure: PERMANENT PACEMAKER INSERTION;  Surgeon: Evans Lance, MD;  Location: [facility] CATH LAB;  Service: Cardiovascular;  Laterality: N/A;  . Right heart catheterization N/A 09/22/2014    Procedure: RIGHT HEART CATH;  Surgeon: Jolaine Artist, MD;  Location: [facility]  CATH LAB;  Service: Cardiovascular;  Laterality: N/A;   Family History  Problem Relation Age of Onset  . Coronary artery disease Father   . Heart disease Father   . Lung cancer    . Alzheimer's disease Mother   . Cancer Brother 51    lung cancer  . Heart attack Father    Social History  Substance Use Topics  . Smoking status: Never Smoker   . Smokeless tobacco: Never Used  . Alcohol Use: No   OB History    No data available     Review of Systems 10/14 systems reviewed and are negative other than those stated in the HPI    Allergies  Other; Tramadol; and Ibuprofen  Home Medications   Prior to Admission medications   Medication Sig Start Date End Date Taking? Authorizing Provider  acetaminophen (TYLENOL) 325 MG tablet Take 2 tablets (650 mg total) by mouth every 4 (four) hours as needed for headache or mild pain. 09/26/14  Yes Isaiah Serge, NP  amiodarone (PACERONE) 200 MG tablet Take 1 tablet (200 mg total) by mouth daily. 11/06/14  Yes Jolaine Artist, MD  apixaban (ELIQUIS) 2.5 MG TABS tablet Take 1 tablet (2.5 mg total) by mouth 2 (two) times daily. 08/03/15  Yes Jolaine Artist, MD  ferrous sulfate 325 (65 FE) MG tablet TAKE ONE TABLET BY MOUTH TWICE DAILY WITH A MEAL 06/02/15  Yes Burnis Medin, MD  furosemide (LASIX) 20 MG tablet Take 20 mg by mouth 2 (two) times daily.   Yes Historical Provider, MD  levothyroxine (SYNTHROID, LEVOTHROID) 75 MCG tablet TAKE ONE TABLET BY MOUTH ONCE DAILY 07/17/15  Yes Burnis Medin, MD  loratadine (CLARITIN) 10 MG tablet Take 10 mg by mouth daily as needed for allergies or rhinitis.    Yes Historical Provider, MD  metolazone (ZAROXOLYN) 2.5 MG tablet Take 1 tablet (2.5 mg total) by mouth as needed (for weight 136 lb or greater). 12/16/14  Yes Jolaine Artist, MD  nitroGLYCERIN (NITROSTAT) 0.4 MG SL tablet Place 1 tablet (0.4 mg total) under the tongue every 5 (five) minutes as needed. For chest pain 12/02/13  Yes Fay Records, MD   oxyCODONE-acetaminophen (PERCOCET/ROXICET) 5-325 MG per tablet Take 0.5-1 tablets by mouth every 6 (six) hours as needed for severe pain. 07/31/15  Yes Marin Olp, MD  pantoprazole (PROTONIX) 40 MG tablet TAKE ONE TABLET BY MOUTH ONCE DAILY 05/04/15  Yes Burnis Medin, MD  potassium chloride SA (K-DUR,KLOR-CON) 20 MEQ tablet Take 60 mEq by mouth daily.   Yes Historical Provider, MD  simvastatin (ZOCOR) 10 MG tablet TAKE ONE TABLET BY MOUTH AT BEDTIME 07/16/15  Yes Jolaine Artist, MD  torsemide (DEMADEX) 20 MG tablet Take 40 mg by mouth daily.   Yes Historical Provider, MD  erythromycin ophthalmic ointment Place 1 application into both eyes  2 (two) times daily. 05/19/15   Burnis Medin, MD  HYDROcodone-acetaminophen (NORCO/VICODIN) 5-325 MG per tablet Take 1 tablet by mouth every 6 (six) hours as needed for moderate pain. 07/18/15   Leo Grosser, MD  traMADol (ULTRAM) 50 MG tablet Take 0.5-1 tablets (25-50 mg total) by mouth every 8 (eight) hours as needed for moderate pain or severe pain. 07/17/15   Burnis Medin, MD   BP 112/39 mmHg  Pulse 71  Temp(Src) 98.6 F (37 C)  Resp 22  SpO2 100% Physical Exam Physical Exam  Nursing note and vitals reviewed. Constitutional: Elderly woman, well developed, well nourished, non-toxic, and in no acute distress Head: Normocephalic and atraumatic.  Mouth/Throat: Oropharynx is clear and moist.  Neck: Normal range of motion. Neck supple.  Cardiovascular: Normal rate and regular rhythm.  No edema. Pulmonary/Chest: Effort normal and breath sounds normal.  Abdominal: Soft. There is no tenderness. There is no rebound and no guarding.  Musculoskeletal: Normal range of motion.  Neurological: Alert, no facial droop, fluent speech, moves all extremities symmetrically Skin: Skin is warm and dry.  Psychiatric: Cooperative  ED Course  Procedures (including critical care time) Labs Review Labs Reviewed  CBC WITH DIFFERENTIAL/PLATELET - Abnormal; Notable  for the following:    RBC 3.83 (*)    Platelets 129 (*)    Lymphs Abs 0.5 (*)    All other components within normal limits  BASIC METABOLIC PANEL - Abnormal; Notable for the following:    Glucose, Bld 106 (*)    BUN 43 (*)    Creatinine, Ser 2.56 (*)    GFR calc non Af Amer 16 (*)    GFR calc Af Amer 18 (*)    All other components within normal limits  I-STAT TROPOININ, ED  Randolm Idol, ED    Imaging Review Dg Chest 2 View  08/12/2015   CLINICAL DATA:  Chest pain and weakness tonight.  EXAM: CHEST  2 VIEW  COMPARISON:  07/30/2015  FINDINGS: The heart is borderline enlarged but stable. The mediastinal and hilar contours are unchanged. There is mild tortuosity and calcification of the thoracic aorta. Mild chronic bronchitic type lung changes but no acute pulmonary findings. No pleural effusion. The bony thorax is intact.  IMPRESSION: No acute cardiopulmonary findings.   Electronically Signed   By: Marijo Sanes M.D.   On: 08/12/2015 18:13   I have personally reviewed and evaluated these images and lab results as part of my medical decision-making.   EKG Interpretation   Date/Time:  Wednesday August 12 2015 16:16:16 EDT Ventricular Rate:  70 PR Interval:  196 QRS Duration: 156 QT Interval:  480 QTC Calculation: 518 R Axis:   94 Text Interpretation:  AV dual-paced rhythm No significant change since  last tracing Confirmed by LIU MD, DANA [phone]) on 08/12/2015 4:30:57 PM      MDM   Final diagnoses:  Orthostatic dizziness  Atypical chest pain    81 year old female with history of hypertension, hyperlipidemia, atrial fibrillation with pacemaker who presents with near syncope and atypical chest pain. Symptoms have fully resolved on time of presentation to the emergency department. Vital signs are within normal limits on presentation. Cardiopulmonary exam is unremarkable and the remainder of her exam is nonfocal. EKG shows AV dual paced rhythm, that is unchanged from prior  EKG. No dynamic changes noted on repeat EKG. Serial troponins are also negative. Basic blood work overall at her baseline, and without acute electrolyte or metabolic derangements. Chest x-ray showing  no acute cardiopulmonary processes. On chart review she was admitted to observation for atypical chest pain about 2 weeks ago. Cardiology had seen her at this time, and states that she has had a left heart catheterization in 2015 showing minimal CAD. She has also had echo May 2016 showing overall normal EF and no significant valvular abnormalities such as aortic stenosis. She also has had unremarkable stress testing that was performed in 2016 that was also unremarkable. They have that time did not feel that she requires additional workup for her atypical chest pain. I discussed this patient again with cardiology given her age and risk factors, they do not feel she requires additional work-up and recommended close follow-up with her cardiology. Her orthostatics here was negative, and she is given a small bolus of IV fluids. Her symptoms does seem consistent with likely orthostatic near syncope as her symptoms initially came on when she immediately stood up from her chair, and went away when she sat back down. I discussed with her close follow-up with her cardiologist as well as strict return instructions. She expressed understanding of all discharge instructions comfortable to plan of care.    Forde Dandy, MD 08/13/15 [phone]

## 2015-08-12 NOTE — ED Notes (Signed)
Pt left with all belongings and was wheeled out of the treatment area.  

## 2015-08-12 NOTE — Telephone Encounter (Signed)
Verbal orders given patient has been to ortho for pain management.

## 2015-08-12 NOTE — ED Notes (Signed)
The pt is c/o rt hip pain for 2 months.  She is scheduled for a c-t tomorrow.   She is also c/o feeling dizzy when she was walking around in the house

## 2015-08-12 NOTE — Discharge Instructions (Signed)
Keep well hydrated. Please call your cardiologist to set up follow-up appointment. Return for worsening symptoms, including confusion, worsening pain, difficulty breathing, passing out, or any other symptoms concerning to you.  Near-Syncope Near-syncope (commonly known as near fainting) is sudden weakness, dizziness, or feeling like you might pass out. During an episode of near-syncope, you may also develop pale skin, have tunnel vision, or feel sick to your stomach (nauseous). Near-syncope may occur when getting up after sitting or while standing for a long time. It is caused by a sudden decrease in blood flow to the brain. This decrease can result from various causes or triggers, most of which are not serious. However, because near-syncope can sometimes be a sign of something serious, a medical evaluation is required. The specific cause is often not determined. HOME CARE INSTRUCTIONS  Monitor your condition for any changes. The following actions may help to alleviate any discomfort you are experiencing:  Have someone stay with you until you feel stable.  Lie down right away and prop your feet up if you start feeling like you might faint. Breathe deeply and steadily. Wait until all the symptoms have passed. Most of these episodes last only a few minutes. You may feel tired for several hours.   Drink enough fluids to keep your urine clear or pale yellow.   If you are taking blood pressure or heart medicine, get up slowly when seated or lying down. Take several minutes to sit and then stand. This can reduce dizziness.  Follow up with your health care provider as directed. SEEK IMMEDIATE MEDICAL CARE IF:   You have a severe headache.   You have unusual pain in the chest, abdomen, or back.   You are bleeding from the mouth or rectum, or you have black or tarry stool.   You have an irregular or very fast heartbeat.   You have repeated fainting or have seizure-like jerking during an  episode.   You faint when sitting or lying down.   You have confusion.   You have difficulty walking.   You have severe weakness.   You have vision problems.  MAKE SURE YOU:   Understand these instructions.  Will watch your condition.  Will get help right away if you are not doing well or get worse. Document Released: 10/31/2005 Document Revised: 11/05/2013 Document Reviewed: 04/05/2013 [facility] Patient Information 2015 [place], [place]. This information is not intended to replace advice given to you by your health care provider. Make sure you discuss any questions you have with your health care provider.

## 2015-08-12 NOTE — ED Notes (Signed)
The pt is c/o rt hip pain  For 1-2 months abd pain and some dizzininess when walking.  She has been seeing an ortho doctor

## 2015-08-13 ENCOUNTER — Ambulatory Visit
Admission: RE | Admit: 2015-08-13 | Discharge: 2015-08-13 | Disposition: A | Discharge status: Home / Self Care | Payer: Medicare Other | Source: Ambulatory Visit | Attending: Orthopedic Surgery | Admitting: Orthopedic Surgery

## 2015-08-13 DIAGNOSIS — M549 Dorsalgia, unspecified: Secondary | ICD-10-CM

## 2015-08-14 ENCOUNTER — Other Ambulatory Visit (HOSPITAL_COMMUNITY): Payer: Self-pay | Admitting: Internal Medicine

## 2015-08-22 ENCOUNTER — Emergency Department (HOSPITAL_COMMUNITY): Payer: Medicare Other

## 2015-08-22 ENCOUNTER — Encounter (HOSPITAL_COMMUNITY): Payer: Self-pay | Admitting: Emergency Medicine

## 2015-08-22 ENCOUNTER — Emergency Department (HOSPITAL_COMMUNITY)
Admission: EM | Admit: 2015-08-22 | Discharge: 2015-08-22 | Disposition: A | Discharge status: Home / Self Care | Payer: Medicare Other | Attending: Emergency Medicine | Admitting: Emergency Medicine

## 2015-08-22 DIAGNOSIS — I10 Essential (primary) hypertension: Secondary | ICD-10-CM | POA: Diagnosis not present

## 2015-08-22 DIAGNOSIS — D649 Anemia, unspecified: Secondary | ICD-10-CM | POA: Insufficient documentation

## 2015-08-22 DIAGNOSIS — Z950 Presence of cardiac pacemaker: Secondary | ICD-10-CM | POA: Diagnosis not present

## 2015-08-22 DIAGNOSIS — K219 Gastro-esophageal reflux disease without esophagitis: Secondary | ICD-10-CM | POA: Diagnosis not present

## 2015-08-22 DIAGNOSIS — R42 Dizziness and giddiness: Secondary | ICD-10-CM | POA: Diagnosis not present

## 2015-08-22 DIAGNOSIS — I5033 Acute on chronic diastolic (congestive) heart failure: Secondary | ICD-10-CM | POA: Diagnosis not present

## 2015-08-22 DIAGNOSIS — R011 Cardiac murmur, unspecified: Secondary | ICD-10-CM | POA: Insufficient documentation

## 2015-08-22 DIAGNOSIS — Z7901 Long term (current) use of anticoagulants: Secondary | ICD-10-CM | POA: Diagnosis not present

## 2015-08-22 DIAGNOSIS — Z8619 Personal history of other infectious and parasitic diseases: Secondary | ICD-10-CM | POA: Diagnosis not present

## 2015-08-22 DIAGNOSIS — Z8603 Personal history of neoplasm of uncertain behavior: Secondary | ICD-10-CM | POA: Insufficient documentation

## 2015-08-22 DIAGNOSIS — Z79899 Other long term (current) drug therapy: Secondary | ICD-10-CM | POA: Diagnosis not present

## 2015-08-22 DIAGNOSIS — E785 Hyperlipidemia, unspecified: Secondary | ICD-10-CM | POA: Insufficient documentation

## 2015-08-22 DIAGNOSIS — M1990 Unspecified osteoarthritis, unspecified site: Secondary | ICD-10-CM | POA: Insufficient documentation

## 2015-08-22 DIAGNOSIS — R079 Chest pain, unspecified: Secondary | ICD-10-CM | POA: Diagnosis not present

## 2015-08-22 DIAGNOSIS — Z792 Long term (current) use of antibiotics: Secondary | ICD-10-CM | POA: Insufficient documentation

## 2015-08-22 DIAGNOSIS — Z85828 Personal history of other malignant neoplasm of skin: Secondary | ICD-10-CM | POA: Insufficient documentation

## 2015-08-22 DIAGNOSIS — I4891 Unspecified atrial fibrillation: Secondary | ICD-10-CM | POA: Insufficient documentation

## 2015-08-22 DIAGNOSIS — E039 Hypothyroidism, unspecified: Secondary | ICD-10-CM | POA: Diagnosis not present

## 2015-08-22 LAB — BASIC METABOLIC PANEL
Anion gap: 10 (ref 5–15)
BUN: 36 mg/dL — ABNORMAL HIGH (ref 6–20)
CALCIUM: 8.8 mg/dL — AB (ref 8.9–10.3)
CO2: 25 mmol/L (ref 22–32)
CREATININE: 2.58 mg/dL — AB (ref 0.44–1.00)
Chloride: 103 mmol/L (ref 101–111)
GFR calc non Af Amer: 16 mL/min — ABNORMAL LOW (ref >60)
GFR, EST AFRICAN AMERICAN: 18 mL/min — AB (ref >60)
GLUCOSE: 102 mg/dL — AB (ref 65–99)
Potassium: 4.5 mmol/L (ref 3.5–5.1)
Sodium: 138 mmol/L (ref 135–145)

## 2015-08-22 LAB — CBC
HCT: 36.6 % (ref 36.0–46.0)
Hemoglobin: 11.7 g/dL — ABNORMAL LOW (ref 12.0–15.0)
MCH: 31.5 pg (ref 26.0–34.0)
MCHC: 32 g/dL (ref 30.0–36.0)
MCV: 98.4 fL (ref 78.0–100.0)
PLATELETS: 158 10*3/uL (ref 150–400)
RBC: 3.72 MIL/uL — AB (ref 3.87–5.11)
RDW: 13.9 % (ref 11.5–15.5)
WBC: 5.6 10*3/uL (ref 4.0–10.5)

## 2015-08-22 LAB — I-STAT TROPONIN, ED
TROPONIN I, POC: 0.02 ng/mL (ref 0.00–0.08)
Troponin i, poc: 0.03 ng/mL (ref 0.00–0.08)

## 2015-08-22 MED ORDER — SODIUM CHLORIDE 0.9 % IV BOLUS (SEPSIS)
1000.0000 mL | Freq: Once | INTRAVENOUS | Status: AC
  Administered 2015-08-22: 1000 mL via INTRAVENOUS

## 2015-08-22 NOTE — ED Notes (Signed)
Patient comes from home states she started having Chest Pain that started yesterday 1100. Patient states pain was relieved with tylenol. Patient states pain in chest in started back this morning. Patient stated this morning she started having SOB and nausea with the pain. Patient describes pain a "Heavy pressure."  Patient Alert & oriented x arrival

## 2015-08-22 NOTE — ED Provider Notes (Signed)
CSN: [id number]     Arrival date & time 08/22/15  1202 History   First MD Initiated Contact with Patient 08/22/15 1238     No chief complaint on file.    (Consider location/radiation/quality/duration/timing/severity/associated sxs/prior Treatment) Patient is a 81 y.o. female presenting with chest pain.  Chest Pain Pain location:  L chest Pain quality: aching and sharp   Pain radiates to:  Does not radiate Pain severity:  Mild Onset quality:  Gradual Duration:  2 days Timing:  Intermittent Chronicity:  Chronic Context: not breathing, no drug use and not eating   Relieved by:  None tried Worsened by:  Nothing tried Ineffective treatments:  None tried Associated symptoms: dizziness   Associated symptoms: no abdominal pain, no back pain, no cough, no dysphagia, no fever and no shortness of breath     Past Medical History  Diagnosis Date  . Dyslipidemia   . Hypertension   . Palpitations     PVCs/bigeminy on event in May 2009 revealing this was relatively asymptomatic  . Chest pain     a. 2011 Neg MV;  b. 01/2014 Cath: LM 20-30, LAD nl, D1 nl, LCX nl, OM1 nl, RCA dom 30-75m, PD/PL nl, EF 65%->Med Rx.  . Degenerative joint disease   . Hx of varicella   . History of skin cancer     tafeen/ non melanoma.   . Thrombocytopenia ([place])   . Anemia   . Monoclonal gammopathies   . Right lower quadrant abdominal pain 07/19/2012    Recurrent  With nausea    This is new since she had ct for llq pain in [facility]    R/o appendiceal problem  Hernia  Get ct scan  And plan  Fu     . Diastolic CHF, acute on chronic ([place]) 01/31/2012  . Atrial fibrillation ([place])     a. Dx 01/2014->Eliquis started.  . Cancer ([place])   . Pacemaker   . Dysrhythmia   . Family history of adverse reaction to anesthesia     " multiple family members have difficulty waking "  . GERD (gastroesophageal reflux disease)   . Hypothyroidism    Past Surgical History  Procedure Laterality Date  . Cholecystectomy  1999  . Cesarean  section      times 2  . Shoulder surgery  1996    Right   . Cataract extraction      Bilateral  implantt  . Cardioversion N/A 02/26/2014    Procedure: CARDIOVERSION AT BEDSIDE;  Surgeon: Pixie Casino, MD;  Location: [place];  Service: Cardiovascular;  Laterality: N/A;  . Cardioversion N/A 04/22/2014    Procedure: CARDIOVERSION (BEDSIDE);  Surgeon: [name], MD;  Location: [facility] OR;  Service: Cardiovascular;  Laterality: N/A;  . Pacemaker insertion  04/23/2014    STJ Assurity dual chamber pacemaker implanted by Dr Rayann Heman  . Insert / replace / remove pacemaker    . Esophagogastroduodenoscopy N/A 07/06/2014    Procedure: ESOPHAGOGASTRODUODENOSCOPY (EGD);  Surgeon: Ladene Artist, MD;  Location[facility] ENDOSCOPY;  Service: Endoscopy;  Laterality: N/A;  . Left heart catheterization with coronary angiogram N/A 01/29/2014    Procedure: LEFT HEART CATHETERIZATION WITH CORONARY ANGIOGRAM;  Surgeon: Blane Ohara, MD;  Location: [facility] CATH LAB;  Service: Cardiovascular;  Laterality: N/A;  . Permanent pacemaker insertion N/A 04/23/2014    Procedure: PERMANENT PACEMAKER INSERTION;  Surgeon: Evans Lance, MD;  Location: [facility] CATH LAB;  Service: Cardiovascular;  Laterality: N/A;  . Right heart catheterization N/A  09/22/2014    Procedure: RIGHT HEART CATH;  Surgeon: Jolaine Artist, MD;  Location: [facility] CATH LAB;  Service: Cardiovascular;  Laterality: N/A;   Family History  Problem Relation Age of Onset  . Coronary artery disease Father   . Heart disease Father   . Lung cancer    . Alzheimer's disease Mother   . Cancer Brother 93    lung cancer  . Heart attack Father    Social History  Substance Use Topics  . Smoking status: Never Smoker   . Smokeless tobacco: Never Used  . Alcohol Use: No   OB History    No data available     Review of Systems  Constitutional: Negative for fever and chills.  HENT: Negative for trouble swallowing.   Eyes: Negative for pain.  Respiratory: Negative for apnea,  cough and shortness of breath.   Cardiovascular: Positive for chest pain.  Gastrointestinal: Negative for abdominal pain.  Endocrine: Negative for polydipsia and polyuria.  Genitourinary: Negative for dysuria.  Musculoskeletal: Negative for back pain and neck pain.  Skin: Negative for rash.  Neurological: Positive for dizziness.  All other systems reviewed and are negative.     Allergies  Other; Tramadol; and Ibuprofen  Home Medications   Prior to Admission medications   Medication Sig Start Date End Date Taking? Authorizing Provider  acetaminophen (TYLENOL) 325 MG tablet Take 2 tablets (650 mg total) by mouth every 4 (four) hours as needed for headache or mild pain. 09/26/14   Isaiah Serge, NP  amiodarone (PACERONE) 200 MG tablet Take 1 tablet (200 mg total) by mouth daily. 11/06/14   Jolaine Artist, MD  apixaban (ELIQUIS) 2.5 MG TABS tablet Take 1 tablet (2.5 mg total) by mouth 2 (two) times daily. 08/03/15   Jolaine Artist, MD  erythromycin ophthalmic ointment Place 1 application into both eyes 2 (two) times daily. 05/19/15   Burnis Medin, MD  ferrous sulfate 325 (65 FE) MG tablet TAKE ONE TABLET BY MOUTH TWICE DAILY WITH A MEAL 06/02/15   Burnis Medin, MD  furosemide (LASIX) 20 MG tablet Take 20 mg by mouth 2 (two) times daily.    Historical Provider, MD  HYDROcodone-acetaminophen (NORCO/VICODIN) 5-325 MG per tablet Take 1 tablet by mouth every 6 (six) hours as needed for moderate pain. 07/18/15   Leo Grosser, MD  levothyroxine (SYNTHROID, LEVOTHROID) 75 MCG tablet TAKE ONE TABLET BY MOUTH ONCE DAILY 07/17/15   Burnis Medin, MD  loratadine (CLARITIN) 10 MG tablet Take 10 mg by mouth daily as needed for allergies or rhinitis.     Historical Provider, MD  metolazone (ZAROXOLYN) 2.5 MG tablet Take 1 tablet (2.5 mg total) by mouth as needed (for weight 136 lb or greater). 12/16/14   Jolaine Artist, MD  nitroGLYCERIN (NITROSTAT) 0.4 MG SL tablet Place 1 tablet (0.4 mg total)  under the tongue every 5 (five) minutes as needed. For chest pain 12/02/13   Fay Records, MD  oxyCODONE-acetaminophen (PERCOCET/ROXICET) 5-325 MG per tablet Take 0.5-1 tablets by mouth every 6 (six) hours as needed for severe pain. 07/31/15   Marin Olp, MD  pantoprazole (PROTONIX) 40 MG tablet TAKE ONE TABLET BY MOUTH ONCE DAILY 05/04/15   Burnis Medin, MD  potassium chloride SA (K-DUR,KLOR-CON) 20 MEQ tablet Take 60 mEq by mouth daily.    Historical Provider, MD  simvastatin (ZOCOR) 10 MG tablet TAKE ONE TABLET BY MOUTH AT BEDTIME 08/17/15   Shaune Pascal Bensimhon,  MD  torsemide (DEMADEX) 20 MG tablet Take 40 mg by mouth daily.    Historical Provider, MD  traMADol (ULTRAM) 50 MG tablet Take 0.5-1 tablets (25-50 mg total) by mouth every 8 (eight) hours as needed for moderate pain or severe pain. 07/17/15   Burnis Medin, MD   BP 112/51 mmHg  Pulse 70  Temp(Src) 98.2 F (36.8 C) (Oral)  Resp 18  SpO2 97% Physical Exam  Constitutional: She is oriented to person, place, and time. She appears well-developed and well-nourished.  HENT:  Head: Normocephalic and atraumatic.  Eyes: Conjunctivae and EOM are normal. Right eye exhibits no discharge. Left eye exhibits no discharge.  Cardiovascular: Normal rate and regular rhythm.   Murmur (III/VI best at upper sternal borders) heard. Pulmonary/Chest: Effort normal and breath sounds normal. No respiratory distress. She has no wheezes.  Abdominal: Soft. She exhibits no distension. There is no tenderness. There is no rebound.  Musculoskeletal: Normal range of motion. She exhibits no edema or tenderness.  Neurological: She is alert and oriented to person, place, and time.  Skin: Skin is warm and dry.  Nursing note and vitals reviewed.   ED Course  Procedures (including critical care time) Labs Review Labs Reviewed  BASIC METABOLIC PANEL - Abnormal; Notable for the following:    Glucose, Bld 102 (*)    BUN 36 (*)    Creatinine, Ser 2.58 (*)     Calcium 8.8 (*)    GFR calc non Af Amer 16 (*)    GFR calc Af Amer 18 (*)    All other components within normal limits  CBC - Abnormal; Notable for the following:    RBC 3.72 (*)    Hemoglobin 11.7 (*)    All other components within normal limits  I-STAT TROPOININ, ED  I-STAT TROPOININ, ED  Randolm Idol, ED    Imaging Review Dg Chest 2 View  08/22/2015   CLINICAL DATA:  Per ED note: Patient comes from home states she started having Chest Pain that started yesterday 1100. Patient states pain was relieved with tylenol. Patient states pain in chest in started back this morning.  EXAM: CHEST - 2 VIEW  COMPARISON:  08/12/2015  FINDINGS: Right subclavian pacemaker stable. Orthopedic anchor over the right humeral head. Heart size upper limits normal. Perihilar interstitial prominence slightly improved. No confluent infiltrate or overt edema. No effusion. No pneumothorax. Atheromatous arch. Thoracolumbar levoscoliosis and degenerative changes as before.  IMPRESSION: 1. Stable chronic and postoperative changes as above. No acute disease.   Electronically Signed   By: Lucrezia Europe M.D.   On: 08/22/2015 12:54   I have personally reviewed and evaluated these images and lab results as part of my medical decision-making.   EKG Interpretation   Date/Time:  Saturday August 22 2015 12:10:13 EDT Ventricular Rate:  70 PR Interval:  335 QRS Duration: 153 QT Interval:  465 QTC Calculation: 502 R Axis:   99 Text Interpretation:  VENTRICULAR PACED RHYTHM Sinus rhythm Prolonged PR  interval Left bundle branch block Probable anteroseptal infarct, recent  Baseline wander in lead(s) I II aVR since last tracing no significant  change Confirmed by MILLER  MD, BRIAN ([id number]) on 08/22/2015 4:19:05 PM      MDM   Final diagnoses:  Chest pain, unspecified chest pain type   Chest pain. Atypical. Similar to multiple previous episodes. On review of records she has had multiple presentations for same in past  with negative workups even from cardiology. Has difficult social  situation which I feel is contributing to her visit here today. Her neighbor and caregiver accompanying her and state that she is been having difficulty with one of her daughters. The patient states whenever she thinks about it makes her chest hurt. There is component of anxiety as well. However she is elderly with a history of other cardiac issues so we'll do a delta troponins to ensure is not ACS. She does have a heart murmur however this is been well documented in the past and most recent echo does not show severe aortic stenosis so I doubt this is contributing. She continue following up with a cardiologist for that. I will also check the rest of her labs to ensure she doesn't have any other acute causes for her discomfort and if these are okay she can be discharged home.  Symptoms improved in the emergency department care coordination on board to help with some needs in the home. Doubt ACS at this time however patient will follow-up with her doctor this week and here for any new or worsening symptoms.  I have personally and contemperaneously reviewed labs and imaging and used in my decision making as above.   A medical screening exam was performed and I feel the patient has had an appropriate workup for their chief complaint at this time and likelihood of emergent condition existing is low. They have been counseled on decision, discharge, follow up and which symptoms necessitate immediate return to the emergency department. They or their family verbally stated understanding and agreement with plan and discharged in stable condition.      Merrily Pew, MD 08/22/15 [phone]

## 2015-08-22 NOTE — Progress Notes (Signed)
Spoke at length with this pleasant 81 y.o. F who has been to the ED for CP twice in the past month without significant admitable findings. Neighbor Advertising copywriter) and [facility] Caregiver Mariann Laster) at bedside. CM consulted as MD concerned that pt does not have 24/7 supervision in her home where she lives alone. [facility] does provide Private Duty Care for 3 Hours, Weekly on Tues and Thursdays. [facility] provides HHPT twice weekly.  Mariann Laster is here today because pts daughter Joseph Art is out of town. Pt has 2 other daughters oldest of which is in [place] and [place] who lives close but is in conflict with her Mom at present. This seems to be primary c/o at present. Neighbors assist with meals and are available when needed. [employer] has been contacted and pt is on list to receive service, yet this family has asked this CM to follow up and see when this might happen. Will suggest [place] SW to assist with Family dynamics and safety issues.

## 2015-08-26 ENCOUNTER — Emergency Department (HOSPITAL_COMMUNITY)
Admission: EM | Admit: 2015-08-26 | Discharge: 2015-08-26 | Disposition: A | Discharge status: Home / Self Care | Payer: Medicare Other | Attending: Emergency Medicine | Admitting: Emergency Medicine

## 2015-08-26 ENCOUNTER — Encounter (HOSPITAL_COMMUNITY): Payer: Self-pay

## 2015-08-26 DIAGNOSIS — E785 Hyperlipidemia, unspecified: Secondary | ICD-10-CM | POA: Diagnosis not present

## 2015-08-26 DIAGNOSIS — Z86018 Personal history of other benign neoplasm: Secondary | ICD-10-CM | POA: Insufficient documentation

## 2015-08-26 DIAGNOSIS — Z8739 Personal history of other diseases of the musculoskeletal system and connective tissue: Secondary | ICD-10-CM | POA: Diagnosis not present

## 2015-08-26 DIAGNOSIS — R2231 Localized swelling, mass and lump, right upper limb: Secondary | ICD-10-CM | POA: Insufficient documentation

## 2015-08-26 DIAGNOSIS — T7840XA Allergy, unspecified, initial encounter: Secondary | ICD-10-CM | POA: Diagnosis not present

## 2015-08-26 DIAGNOSIS — Z79899 Other long term (current) drug therapy: Secondary | ICD-10-CM | POA: Diagnosis not present

## 2015-08-26 DIAGNOSIS — Z8619 Personal history of other infectious and parasitic diseases: Secondary | ICD-10-CM | POA: Insufficient documentation

## 2015-08-26 DIAGNOSIS — E039 Hypothyroidism, unspecified: Secondary | ICD-10-CM | POA: Insufficient documentation

## 2015-08-26 DIAGNOSIS — I10 Essential (primary) hypertension: Secondary | ICD-10-CM | POA: Insufficient documentation

## 2015-08-26 DIAGNOSIS — Z85828 Personal history of other malignant neoplasm of skin: Secondary | ICD-10-CM | POA: Diagnosis not present

## 2015-08-26 DIAGNOSIS — I5033 Acute on chronic diastolic (congestive) heart failure: Secondary | ICD-10-CM | POA: Diagnosis not present

## 2015-08-26 DIAGNOSIS — Z792 Long term (current) use of antibiotics: Secondary | ICD-10-CM | POA: Insufficient documentation

## 2015-08-26 DIAGNOSIS — Z862 Personal history of diseases of the blood and blood-forming organs and certain disorders involving the immune mechanism: Secondary | ICD-10-CM | POA: Diagnosis not present

## 2015-08-26 DIAGNOSIS — Z950 Presence of cardiac pacemaker: Secondary | ICD-10-CM | POA: Diagnosis not present

## 2015-08-26 DIAGNOSIS — K219 Gastro-esophageal reflux disease without esophagitis: Secondary | ICD-10-CM | POA: Insufficient documentation

## 2015-08-26 DIAGNOSIS — D649 Anemia, unspecified: Secondary | ICD-10-CM | POA: Insufficient documentation

## 2015-08-26 DIAGNOSIS — R220 Localized swelling, mass and lump, head: Secondary | ICD-10-CM | POA: Diagnosis present

## 2015-08-26 MED ORDER — METHYLPREDNISOLONE SODIUM SUCC 125 MG IJ SOLR
125.0000 mg | Freq: Once | INTRAMUSCULAR | Status: AC
  Administered 2015-08-26: 125 mg via INTRAVENOUS
  Filled 2015-08-26: qty 2

## 2015-08-26 MED ORDER — DIPHENHYDRAMINE HCL 50 MG/ML IJ SOLN
25.0000 mg | Freq: Once | INTRAMUSCULAR | Status: AC
  Administered 2015-08-26: 25 mg via INTRAVENOUS
  Filled 2015-08-26: qty 1

## 2015-08-26 MED ORDER — SODIUM CHLORIDE 0.9 % IV BOLUS (SEPSIS)
500.0000 mL | Freq: Once | INTRAVENOUS | Status: AC
  Administered 2015-08-26: 500 mL via INTRAVENOUS

## 2015-08-26 MED ORDER — FAMOTIDINE IN NACL 20-0.9 MG/50ML-% IV SOLN
20.0000 mg | Freq: Once | INTRAVENOUS | Status: AC
  Administered 2015-08-26: 20 mg via INTRAVENOUS
  Filled 2015-08-26: qty 50

## 2015-08-26 NOTE — ED Notes (Signed)
She reports facial and lips swelling; also swelling and redness of hands.  She denies any throat swelling or any feeling of choking.  She describes a strange "numbness" around her lips.  She is oriented x 4 with clear speech.

## 2015-08-26 NOTE — Discharge Instructions (Signed)
There does not appear to be an emergent cause for your symptoms at this time. Please follow-up with your doctor within 2 or 3 days for reevaluation. Do not take medications that you are unsure of what they may be as this can be detrimental to your health. Return to ED for any worsening symptoms.  Allergies An allergy is an abnormal reaction to a substance by the body's defense system (immune system). Allergies can develop at any age. WHAT CAUSES ALLERGIES? An allergic reaction happens when the immune system mistakenly reacts to a normally harmless substance, called an allergen, as if it were harmful. The immune system releases antibodies to fight the substance. Antibodies eventually release a chemical called histamine into the bloodstream. The release of histamine is meant to protect the body from infection, but it also causes discomfort. An allergic reaction can be triggered by:  Eating an allergen.  Inhaling an allergen.  Touching an allergen. WHAT TYPES OF ALLERGIES ARE THERE? There are many types of allergies. Common types include:  Seasonal allergies. People with this type of allergy are usually allergic to substances that are only present during certain seasons, such as molds and pollens.  Food allergies.  Drug allergies.  Insect allergies.  Animal dander allergies. WHAT ARE SYMPTOMS OF ALLERGIES? Possible allergy symptoms include:  Swelling of the lips, face, tongue, mouth, or throat.  Sneezing, coughing, or wheezing.  Nasal congestion.  Tingling in the mouth.  Rash.  Itching.  Itchy, red, swollen areas of skin (hives).  Watery eyes.  Vomiting.  Diarrhea.  Dizziness.  Lightheadedness.  Fainting.  Trouble breathing or swallowing.  Chest tightness.  Rapid heartbeat. HOW ARE ALLERGIES DIAGNOSED? Allergies are diagnosed with a medical and family history and one or more of the following:  Skin tests.  Blood tests.  A food diary. A food diary is a  record of all the foods and drinks you have in a day and of all the symptoms you experience.  The results of an elimination diet. An elimination diet involves eliminating foods from your diet and then adding them back in one by one to find out if a certain food causes an allergic reaction. HOW ARE ALLERGIES TREATED? There is no cure for allergies, but allergic reactions can be treated with medicine. Severe reactions usually need to be treated at a hospital. HOW CAN REACTIONS BE PREVENTED? The best way to prevent an allergic reaction is by avoiding the substance you are allergic to. Allergy shots and medicines can also help prevent reactions in some cases. People with severe allergic reactions may be able to prevent a life-threatening reaction called anaphylaxis with a medicine given right after exposure to the allergen.   This information is not intended to replace advice given to you by your health care provider. Make sure you discuss any questions you have with your health care provider.   Document Released: 01/24/2003 Document Revised: 11/21/2014 Document Reviewed: 08/12/2014 Elsevier Interactive Patient Education [employer].

## 2015-08-26 NOTE — ED Notes (Signed)
Bed: XK48 Expected date:  Expected time:  Means of arrival:  Comments: EMS- 81yo F, allergic reaction?

## 2015-08-26 NOTE — ED Notes (Signed)
She continues to have no respiratory difficulties and is in no distress.  She specifically denies any feeling of throat swelling or fullness.

## 2015-08-26 NOTE — ED Provider Notes (Signed)
CSN: [id number]     Arrival date & time 08/26/15  1034 History   First MD Initiated Contact with Patient 08/26/15 1041     No chief complaint on file.    (Consider location/radiation/quality/duration/timing/severity/associated sxs/prior Treatment) HPI [name] is a 81 y.o. female with multiple medical problems including hypertension, dyslipidemia, diastolic CHF, A. fib with pacemaker, comes in for evaluation of allergic reaction. Patient states she typically takes pain medicine for a recently diagnosed vertebral fracture, but usually has to take this medication with food. She reports "it was getting late last night and I didn't want to eat again decided not taking my pain medicine". She reports instead she found a bottle of "equate" in her kitchen and took 2 of those pills. She reports she cannot read any of the words on the bottle other than "equate", even with her reading glasses. She took these medications at approximately 10:00 PM last night. She woke this morning at approximately 8:00 AM and noticed that her right hand was swollen, her lips are swollen and she had difficulty hearing out of her left ear. She denies any fevers, chills, shortness of breath, difficulty swallowing, nausea or vomiting, abdominal pain, other rash. Reports she had 2 Benadryl in route EMS.  Past Medical History  Diagnosis Date  . Dyslipidemia   . Hypertension   . Palpitations     PVCs/bigeminy on event in May 2009 revealing this was relatively asymptomatic  . Chest pain     a. 2011 Neg MV;  b. 01/2014 Cath: LM 20-30, LAD nl, D1 nl, LCX nl, OM1 nl, RCA dom 30-75m, PD/PL nl, EF 65%->Med Rx.  . Degenerative joint disease   . Hx of varicella   . History of skin cancer     tafeen/ non melanoma.   . Thrombocytopenia ([place])   . Anemia   . Monoclonal gammopathies   . Right lower quadrant abdominal pain 07/19/2012    Recurrent  With nausea    This is new since she had ct for llq pain in [facility]    R/o appendiceal  problem  Hernia  Get ct scan  And plan  Fu     . Diastolic CHF, acute on chronic ([place]) 01/31/2012  . Atrial fibrillation ([place])     a. Dx 01/2014->Eliquis started.  . Cancer ([place])   . Pacemaker   . Dysrhythmia   . Family history of adverse reaction to anesthesia     " multiple family members have difficulty waking "  . GERD (gastroesophageal reflux disease)   . Hypothyroidism    Past Surgical History  Procedure Laterality Date  . Cholecystectomy  1999  . Cesarean section      times 2  . Shoulder surgery  1996    Right   . Cataract extraction      Bilateral  implantt  . Cardioversion N/A 02/26/2014    Procedure: CARDIOVERSION AT BEDSIDE;  Surgeon: Pixie Casino, MD;  Location: [facility];  Service: Cardiovascular;  Laterality: N/A;  . Cardioversion N/A 04/22/2014    Procedure: CARDIOVERSION (BEDSIDE);  Surgeon: [name], MD;  Location: [facility] OR;  Service: Cardiovascular;  Laterality: N/A;  . Pacemaker insertion  04/23/2014    STJ Assurity dual chamber pacemaker implanted by Dr Rayann Heman  . Insert / replace / remove pacemaker    . Esophagogastroduodenoscopy N/A 07/06/2014    Procedure: ESOPHAGOGASTRODUODENOSCOPY (EGD);  Surgeon: Ladene Artist, MD;  Location: [facility] ENDOSCOPY;  Service: Endoscopy;  Laterality: N/A;  .  Left heart catheterization with coronary angiogram N/A 01/29/2014    Procedure: LEFT HEART CATHETERIZATION WITH CORONARY ANGIOGRAM;  Surgeon: Blane Ohara, MD;  Location: [facility] CATH LAB;  Service: Cardiovascular;  Laterality: N/A;  . Permanent pacemaker insertion N/A 04/23/2014    Procedure: PERMANENT PACEMAKER INSERTION;  Surgeon: Evans Lance, MD;  Location: [facility] CATH LAB;  Service: Cardiovascular;  Laterality: N/A;  . Right heart catheterization N/A 09/22/2014    Procedure: RIGHT HEART CATH;  Surgeon: Jolaine Artist, MD;  Location: [facility] CATH LAB;  Service: Cardiovascular;  Laterality: N/A;   Family History  Problem Relation Age of Onset  . Coronary artery disease Father   .  Heart disease Father   . Lung cancer    . Alzheimer's disease Mother   . Cancer Brother 13    lung cancer  . Heart attack Father    Social History  Substance Use Topics  . Smoking status: Never Smoker   . Smokeless tobacco: Never Used  . Alcohol Use: No   OB History    No data available     Review of Systems A 10 point review of systems was completed and was negative except for pertinent positives and negatives as mentioned in the history of present illness     Allergies  Other; Tramadol; and Ibuprofen  Home Medications   Prior to Admission medications   Medication Sig Start Date End Date Taking? Authorizing Provider  acetaminophen (TYLENOL) 325 MG tablet Take 2 tablets (650 mg total) by mouth every 4 (four) hours as needed for headache or mild pain. 09/26/14  Yes Isaiah Serge, NP  amiodarone (PACERONE) 200 MG tablet Take 1 tablet (200 mg total) by mouth daily. 11/06/14  Yes Jolaine Artist, MD  apixaban (ELIQUIS) 2.5 MG TABS tablet Take 1 tablet (2.5 mg total) by mouth 2 (two) times daily. 08/03/15  Yes Jolaine Artist, MD  erythromycin ophthalmic ointment Place 1 application into both eyes 2 (two) times daily. 05/19/15  Yes Burnis Medin, MD  ferrous sulfate 325 (65 FE) MG tablet TAKE ONE TABLET BY MOUTH TWICE DAILY WITH A MEAL 06/02/15  Yes Burnis Medin, MD  furosemide (LASIX) 20 MG tablet Take 20 mg by mouth 2 (two) times daily.   Yes Historical Provider, MD  levothyroxine (SYNTHROID, LEVOTHROID) 75 MCG tablet TAKE ONE TABLET BY MOUTH ONCE DAILY 07/17/15  Yes Burnis Medin, MD  loratadine (CLARITIN) 10 MG tablet Take 10 mg by mouth daily as needed for allergies or rhinitis.    Yes Historical Provider, MD  metolazone (ZAROXOLYN) 2.5 MG tablet Take 1 tablet (2.5 mg total) by mouth as needed (for weight 136 lb or greater). 12/16/14  Yes Jolaine Artist, MD  nitroGLYCERIN (NITROSTAT) 0.4 MG SL tablet Place 1 tablet (0.4 mg total) under the tongue every 5 (five) minutes as  needed. For chest pain 12/02/13  Yes Fay Records, MD  oxyCODONE-acetaminophen (PERCOCET/ROXICET) 5-325 MG per tablet Take 0.5-1 tablets by mouth every 6 (six) hours as needed for severe pain. 07/31/15  Yes Marin Olp, MD  pantoprazole (PROTONIX) 40 MG tablet TAKE ONE TABLET BY MOUTH ONCE DAILY 05/04/15  Yes Burnis Medin, MD  potassium chloride SA (K-DUR,KLOR-CON) 20 MEQ tablet Take 60 mEq by mouth daily.   Yes Historical Provider, MD  simvastatin (ZOCOR) 10 MG tablet TAKE ONE TABLET BY MOUTH AT BEDTIME 08/17/15  Yes Jolaine Artist, MD  torsemide (DEMADEX) 20 MG tablet Take 40 mg by  mouth daily.   Yes Historical Provider, MD  HYDROcodone-acetaminophen (NORCO/VICODIN) 5-325 MG per tablet Take 1 tablet by mouth every 6 (six) hours as needed for moderate pain. Patient not taking: Reported on 08/26/2015 07/18/15   Leo Grosser, MD  traMADol (ULTRAM) 50 MG tablet Take 0.5-1 tablets (25-50 mg total) by mouth every 8 (eight) hours as needed for moderate pain or severe pain. Patient not taking: Reported on 08/26/2015 07/17/15   Burnis Medin, MD   BP 127/65 mmHg  Pulse 74  Temp(Src) 97.8 F (36.6 C) (Oral)  Resp 16  SpO2 95% Physical Exam  Constitutional: She is oriented to person, place, and time. She appears well-developed and well-nourished.  HENT:  Head: Normocephalic and atraumatic.  Mucous membranes are dry. Mild to moderate upper and lower lip swelling.  Eyes: Conjunctivae are normal. Right eye exhibits no discharge. Left eye exhibits no discharge. No scleral icterus.  Anisocoria with left pupil slightly larger than right. Patient states his baseline  Neck: Neck supple.  Cardiovascular: Normal rate, regular rhythm and normal heart sounds.   Pulmonary/Chest: Effort normal and breath sounds normal. No respiratory distress. She has no wheezes. She has no rales.  Abdominal: Soft. There is no tenderness.  Musculoskeletal: She exhibits no tenderness.  Neurological: She is alert and  oriented to person, place, and time.  Cranial Nerves II-XII grossly intact  Skin: Skin is warm and dry. No rash noted.  Mild erythema with edema to dorsum of right hand. No other rash noted  Psychiatric: She has a normal mood and affect.  Nursing note and vitals reviewed.   ED Course  Procedures (including critical care time) Labs Review Labs Reviewed - No data to display  Imaging Review No results found. I have personally reviewed and evaluated these images and lab results as part of my medical decision-making.   EKG Interpretation None     Meds given in ED:  Medications  sodium chloride 0.9 % bolus 500 mL (0 mLs Intravenous Stopped 08/26/15 1330)  methylPREDNISolone sodium succinate (SOLU-MEDROL) 125 mg/2 mL injection 125 mg (125 mg Intravenous Given 08/26/15 1130)  famotidine (PEPCID) IVPB 20 mg premix (0 mg Intravenous Stopped 08/26/15 1230)  diphenhydrAMINE (BENADRYL) injection 25 mg (25 mg Intravenous Given 08/26/15 1130)    Discharge Medication List as of 08/26/2015  1:59 PM     Filed Vitals:   08/26/15 1050 08/26/15 1135 08/26/15 1327  BP: 138/52 126/63 127/65  Pulse: 75 70 74  Temp: 97.8 F (36.6 C)    TempSrc: Oral    Resp: 16 18 16   SpO2: 97% 96% 95%    MDM  [name] is a 81 y.o. female who comes in for evaluation of allergic reaction. Patient took unknown pills in the bottle marked "equate", [employer] brand medication. Woke up this morning with swelling to dorsum of right hand as well as swelling and labs and decreased hearing in left ear. No palpitations, shortness of breath, difficulty swallowing, breathing, no nausea or vomiting, abdominal discomfort. No evidence of anaphylaxis at this time. On exam, patient has mild circumoral lip swelling without any oropharyngeal swelling. Mild swelling to dorsum of right hand. Anisocoria, which is baseline for patient. Patient treated in the ED with steroids, Pepcid and Benadryl. She experienced improvement in  all of her symptoms with this intervention. No evidence of other acute or emergent pathology at this time. Patient is appropriate for discharge to follow-up with PCP. Advised her not to take any medications if she was  not sure of what she was taking. Patient agrees. Overall, patient appears well, nontoxic with normal vital signs and is appropriate for discharge. Prior to patient discharge, I discussed and reviewed this case with Dr. Mingo Amber who also saw and evaluated the patient  Final diagnoses:  Allergic reaction, initial encounter      Comer Locket, PA-C 08/26/15 [place], MD 08/27/15 1547

## 2015-08-26 NOTE — ED Notes (Signed)
She reports feeling "about the same".  She still has visible (min.-mod.) peri-oral swelling with minimal erythema; and continues to have bilat. Hand swelling/erythema R > L.  She continues to deny any feeling of throat swelling/tightness and is in no distress.

## 2015-08-28 ENCOUNTER — Telehealth: Payer: Self-pay | Admitting: Internal Medicine

## 2015-08-28 NOTE — Telephone Encounter (Signed)
Melissa from [facility] call to ask for verbal orders for Physical therapy  extenstion 1 time a week for 3 weeks Pt is progressing well   Lenna Sciara [phone]

## 2015-08-28 NOTE — Telephone Encounter (Signed)
Ok

## 2015-08-31 NOTE — Telephone Encounter (Signed)
Spoke to [place].  Advised to extend physical therapy for once weekly for three weeks.

## 2015-09-10 ENCOUNTER — Emergency Department (HOSPITAL_COMMUNITY): Payer: Medicare Other

## 2015-09-10 ENCOUNTER — Emergency Department (HOSPITAL_COMMUNITY)
Admission: EM | Admit: 2015-09-10 | Discharge: 2015-09-10 | Disposition: A | Discharge status: Home / Self Care | Payer: Medicare Other | Attending: Emergency Medicine | Admitting: Emergency Medicine

## 2015-09-10 ENCOUNTER — Encounter (HOSPITAL_COMMUNITY): Payer: Self-pay

## 2015-09-10 ENCOUNTER — Other Ambulatory Visit (HOSPITAL_COMMUNITY): Payer: Self-pay | Admitting: Internal Medicine

## 2015-09-10 DIAGNOSIS — E785 Hyperlipidemia, unspecified: Secondary | ICD-10-CM | POA: Insufficient documentation

## 2015-09-10 DIAGNOSIS — Z8619 Personal history of other infectious and parasitic diseases: Secondary | ICD-10-CM | POA: Diagnosis not present

## 2015-09-10 DIAGNOSIS — S199XXA Unspecified injury of neck, initial encounter: Secondary | ICD-10-CM | POA: Diagnosis not present

## 2015-09-10 DIAGNOSIS — E039 Hypothyroidism, unspecified: Secondary | ICD-10-CM | POA: Insufficient documentation

## 2015-09-10 DIAGNOSIS — Z79899 Other long term (current) drug therapy: Secondary | ICD-10-CM | POA: Diagnosis not present

## 2015-09-10 DIAGNOSIS — Y999 Unspecified external cause status: Secondary | ICD-10-CM | POA: Insufficient documentation

## 2015-09-10 DIAGNOSIS — Z9889 Other specified postprocedural states: Secondary | ICD-10-CM | POA: Diagnosis not present

## 2015-09-10 DIAGNOSIS — S299XXA Unspecified injury of thorax, initial encounter: Secondary | ICD-10-CM | POA: Insufficient documentation

## 2015-09-10 DIAGNOSIS — Z950 Presence of cardiac pacemaker: Secondary | ICD-10-CM | POA: Diagnosis not present

## 2015-09-10 DIAGNOSIS — I4891 Unspecified atrial fibrillation: Secondary | ICD-10-CM | POA: Insufficient documentation

## 2015-09-10 DIAGNOSIS — Z792 Long term (current) use of antibiotics: Secondary | ICD-10-CM | POA: Insufficient documentation

## 2015-09-10 DIAGNOSIS — Z7901 Long term (current) use of anticoagulants: Secondary | ICD-10-CM | POA: Insufficient documentation

## 2015-09-10 DIAGNOSIS — I10 Essential (primary) hypertension: Secondary | ICD-10-CM | POA: Diagnosis not present

## 2015-09-10 DIAGNOSIS — K219 Gastro-esophageal reflux disease without esophagitis: Secondary | ICD-10-CM | POA: Insufficient documentation

## 2015-09-10 DIAGNOSIS — I5033 Acute on chronic diastolic (congestive) heart failure: Secondary | ICD-10-CM | POA: Insufficient documentation

## 2015-09-10 DIAGNOSIS — S6991XA Unspecified injury of right wrist, hand and finger(s), initial encounter: Secondary | ICD-10-CM | POA: Insufficient documentation

## 2015-09-10 DIAGNOSIS — Z85828 Personal history of other malignant neoplasm of skin: Secondary | ICD-10-CM | POA: Insufficient documentation

## 2015-09-10 DIAGNOSIS — S3991XA Unspecified injury of abdomen, initial encounter: Secondary | ICD-10-CM | POA: Insufficient documentation

## 2015-09-10 DIAGNOSIS — Y92410 Unspecified street and highway as the place of occurrence of the external cause: Secondary | ICD-10-CM | POA: Diagnosis not present

## 2015-09-10 DIAGNOSIS — Y939 Activity, unspecified: Secondary | ICD-10-CM | POA: Diagnosis not present

## 2015-09-10 DIAGNOSIS — Z8739 Personal history of other diseases of the musculoskeletal system and connective tissue: Secondary | ICD-10-CM | POA: Insufficient documentation

## 2015-09-10 DIAGNOSIS — R0789 Other chest pain: Secondary | ICD-10-CM

## 2015-09-10 LAB — URINALYSIS, ROUTINE W REFLEX MICROSCOPIC
BILIRUBIN URINE: NEGATIVE
Glucose, UA: NEGATIVE mg/dL
HGB URINE DIPSTICK: NEGATIVE
KETONES UR: NEGATIVE mg/dL
Leukocytes, UA: NEGATIVE
NITRITE: NEGATIVE
PROTEIN: NEGATIVE mg/dL
Specific Gravity, Urine: 1.007 (ref 1.005–1.030)
UROBILINOGEN UA: 0.2 mg/dL (ref 0.0–1.0)
pH: 6.5 (ref 5.0–8.0)

## 2015-09-10 LAB — COMPREHENSIVE METABOLIC PANEL
ALT: 18 U/L (ref 14–54)
AST: 19 U/L (ref 15–41)
Albumin: 3.3 g/dL — ABNORMAL LOW (ref 3.5–5.0)
Alkaline Phosphatase: 103 U/L (ref 38–126)
Anion gap: 10 (ref 5–15)
BUN: 19 mg/dL (ref 6–20)
CHLORIDE: 106 mmol/L (ref 101–111)
CO2: 26 mmol/L (ref 22–32)
CREATININE: 1.91 mg/dL — AB (ref 0.44–1.00)
Calcium: 9.5 mg/dL (ref 8.9–10.3)
GFR calc non Af Amer: 22 mL/min — ABNORMAL LOW (ref >60)
GFR, EST AFRICAN AMERICAN: 26 mL/min — AB (ref >60)
Glucose, Bld: 108 mg/dL — ABNORMAL HIGH (ref 65–99)
POTASSIUM: 4.3 mmol/L (ref 3.5–5.1)
SODIUM: 142 mmol/L (ref 135–145)
Total Bilirubin: 0.7 mg/dL (ref 0.3–1.2)
Total Protein: 5.8 g/dL — ABNORMAL LOW (ref 6.5–8.1)

## 2015-09-10 LAB — CBC WITH DIFFERENTIAL/PLATELET
Basophils Absolute: 0 10*3/uL (ref 0.0–0.1)
Basophils Relative: 0 %
EOS ABS: 0.1 10*3/uL (ref 0.0–0.7)
Eosinophils Relative: 1 %
HEMATOCRIT: 38.2 % (ref 36.0–46.0)
HEMOGLOBIN: 12 g/dL (ref 12.0–15.0)
LYMPHS ABS: 0.6 10*3/uL — AB (ref 0.7–4.0)
LYMPHS PCT: 8 %
MCH: 31.6 pg (ref 26.0–34.0)
MCHC: 31.4 g/dL (ref 30.0–36.0)
MCV: 100.5 fL — AB (ref 78.0–100.0)
MONOS PCT: 5 %
Monocytes Absolute: 0.4 10*3/uL (ref 0.1–1.0)
NEUTROS ABS: 5.6 10*3/uL (ref 1.7–7.7)
NEUTROS PCT: 86 %
Platelets: 126 10*3/uL — ABNORMAL LOW (ref 150–400)
RBC: 3.8 MIL/uL — ABNORMAL LOW (ref 3.87–5.11)
RDW: 14.8 % (ref 11.5–15.5)
WBC: 6.6 10*3/uL (ref 4.0–10.5)

## 2015-09-10 MED ORDER — MORPHINE SULFATE (PF) 2 MG/ML IV SOLN
2.0000 mg | Freq: Once | INTRAVENOUS | Status: AC
  Administered 2015-09-10: 2 mg via INTRAVENOUS
  Filled 2015-09-10: qty 1

## 2015-09-10 MED ORDER — MORPHINE SULFATE (PF) 4 MG/ML IV SOLN: 4.0000 mg | Freq: Once | INTRAVENOUS | Status: DC

## 2015-09-10 MED ORDER — OXYCODONE-ACETAMINOPHEN 5-325 MG PO TABS
1.0000 | ORAL_TABLET | Freq: Once | ORAL | Status: AC
  Administered 2015-09-10: 1 via ORAL
  Filled 2015-09-10: qty 1
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
## 2015-09-10 NOTE — ED Provider Notes (Signed)
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 with patient/guardian, who agrees with care plan. All questions answered. Return precautions discussed and outpatient follow up given.     Monico Blitz, PA-C 09/10/15 [place], MD 09/14/15 [phone]

## 2015-09-10 NOTE — ED Notes (Signed)
Pt up to use bedside commode without difficulty.

## 2015-09-10 NOTE — Discharge Instructions (Signed)
Take percocet for breakthrough pain, do not drink alcohol, drive, care for children or do other critical tasks while taking percocet.  Please be very careful not to fall! The pain medication and puts you at risk for falls. Please rest as much as possible and try to not stay alone.   Please follow with your primary care doctor in the next 2 days for a check-up. They must obtain records for further management.   Do not hesitate to return to the Emergency Department for any new, worsening or concerning symptoms.    Chest Wall Pain Chest wall pain is pain in or around the bones and muscles of your chest. Sometimes, an injury causes this pain. Sometimes, the cause may not be known. This pain may take several weeks or longer to get better. HOME CARE INSTRUCTIONS  Pay attention to any changes in your symptoms. Take these actions to help with your pain:   Rest as told by your health care provider.   Avoid activities that cause pain. These include any activities that use your chest muscles or your abdominal and side muscles to lift heavy items.   If directed, apply ice to the painful area:  Put ice in a plastic bag.  Place a towel between your skin and the bag.  Leave the ice on for 20 minutes, 2-3 times per day.  Take over-the-counter and prescription medicines only as told by your health care provider.  Do not use tobacco products, including cigarettes, chewing tobacco, and e-cigarettes. If you need help quitting, ask your health care provider.  Keep all follow-up visits as told by your health care provider. This is important. SEEK MEDICAL CARE IF:  You have a fever.  Your chest pain becomes worse.  You have new symptoms. SEEK IMMEDIATE MEDICAL CARE IF:  You have nausea or vomiting.  You feel sweaty or light-headed.  You have a cough with phlegm (sputum) or you cough up blood.  You develop shortness of breath.   This information is not intended to replace advice given to  you by your health care provider. Make sure you discuss any questions you have with your health care provider.   Document Released: 10/31/2005 Document Revised: 07/22/2015 Document Reviewed: 01/26/2015 Elsevier Interactive Patient Education [employer].
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
## 2015-09-15 ENCOUNTER — Other Ambulatory Visit: Payer: Self-pay | Admitting: Internal Medicine

## 2015-09-16 NOTE — Telephone Encounter (Signed)
Filled on 06/02/15 for six months.  Request is too early.

## 2015-09-18 ENCOUNTER — Encounter (HOSPITAL_COMMUNITY): Payer: Self-pay | Admitting: Family Medicine

## 2015-09-18 ENCOUNTER — Emergency Department (HOSPITAL_COMMUNITY): Payer: Medicare Other

## 2015-09-18 ENCOUNTER — Emergency Department (HOSPITAL_COMMUNITY)
Admission: EM | Admit: 2015-09-18 | Discharge: 2015-09-18 | Disposition: A | Discharge status: Home / Self Care | Payer: Medicare Other | Attending: Emergency Medicine | Admitting: Emergency Medicine

## 2015-09-18 DIAGNOSIS — Z85828 Personal history of other malignant neoplasm of skin: Secondary | ICD-10-CM | POA: Diagnosis not present

## 2015-09-18 DIAGNOSIS — R0789 Other chest pain: Secondary | ICD-10-CM | POA: Insufficient documentation

## 2015-09-18 DIAGNOSIS — E785 Hyperlipidemia, unspecified: Secondary | ICD-10-CM | POA: Insufficient documentation

## 2015-09-18 DIAGNOSIS — Z79899 Other long term (current) drug therapy: Secondary | ICD-10-CM | POA: Diagnosis not present

## 2015-09-18 DIAGNOSIS — Z87828 Personal history of other (healed) physical injury and trauma: Secondary | ICD-10-CM | POA: Diagnosis not present

## 2015-09-18 DIAGNOSIS — R079 Chest pain, unspecified: Secondary | ICD-10-CM | POA: Diagnosis present

## 2015-09-18 DIAGNOSIS — Z950 Presence of cardiac pacemaker: Secondary | ICD-10-CM | POA: Diagnosis not present

## 2015-09-18 DIAGNOSIS — Z7901 Long term (current) use of anticoagulants: Secondary | ICD-10-CM | POA: Insufficient documentation

## 2015-09-18 DIAGNOSIS — Z9889 Other specified postprocedural states: Secondary | ICD-10-CM | POA: Diagnosis not present

## 2015-09-18 DIAGNOSIS — I4891 Unspecified atrial fibrillation: Secondary | ICD-10-CM | POA: Diagnosis not present

## 2015-09-18 DIAGNOSIS — K219 Gastro-esophageal reflux disease without esophagitis: Secondary | ICD-10-CM | POA: Diagnosis not present

## 2015-09-18 DIAGNOSIS — I5033 Acute on chronic diastolic (congestive) heart failure: Secondary | ICD-10-CM | POA: Insufficient documentation

## 2015-09-18 DIAGNOSIS — Z792 Long term (current) use of antibiotics: Secondary | ICD-10-CM | POA: Diagnosis not present

## 2015-09-18 DIAGNOSIS — Z8619 Personal history of other infectious and parasitic diseases: Secondary | ICD-10-CM | POA: Insufficient documentation

## 2015-09-18 DIAGNOSIS — D649 Anemia, unspecified: Secondary | ICD-10-CM | POA: Diagnosis not present

## 2015-09-18 DIAGNOSIS — E039 Hypothyroidism, unspecified: Secondary | ICD-10-CM | POA: Diagnosis not present

## 2015-09-18 MED ORDER — HYDROCODONE-ACETAMINOPHEN 5-325 MG PO TABS: 1.0000 | ORAL_TABLET | ORAL | Status: DC | PRN

## 2015-09-18 MED ORDER — HYDROCODONE-ACETAMINOPHEN 5-325 MG PO TABS
2.0000 | ORAL_TABLET | Freq: Once | ORAL | Status: DC
  Filled 2015-09-18: qty 2

## 2015-09-18 MED ORDER — HYDROCODONE-ACETAMINOPHEN 5-325 MG PO TABS
1.0000 | ORAL_TABLET | Freq: Once | ORAL | Status: AC
  Administered 2015-09-18: 1 via ORAL
  Filled 2015-09-18: qty 1

## 2015-09-18 NOTE — Discharge Instructions (Signed)
If you were given medicines take as directed.  If you are on coumadin or contraceptives realize their levels and effectiveness is altered by many different medicines.  If you have any reaction (rash, tongues swelling, other) to the medicines stop taking and see a physician.   For severe pain take norco or vicodin however realize they have the potential for addiction and it can make you sleepy and has tylenol in it.  No operating machinery while taking.  If your blood pressure was elevated in the ER make sure you follow up for management with a primary doctor or return for chest pain, shortness of breath or stroke symptoms.  Please follow up as directed and return to the ER or see a physician for new or worsening symptoms.  Thank you. Filed Vitals:   09/18/15 1014 09/18/15 1018 09/18/15 1140 09/18/15 1141  BP:  106/44 93/47 96/56   Pulse:  75 73   Temp:  97.9 F (36.6 C)    TempSrc:  Oral    Resp:  11 20   SpO2: 97% 98% 96%
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
## 2015-09-18 NOTE — ED Notes (Signed)
Pt comfortable with discharge and follow up instructions. Prescriptions x1 

## 2015-09-20 ENCOUNTER — Emergency Department (HOSPITAL_COMMUNITY): Payer: Medicare Other

## 2015-09-20 ENCOUNTER — Emergency Department (HOSPITAL_COMMUNITY)
Admission: EM | Admit: 2015-09-20 | Discharge: 2015-09-20 | Disposition: A | Discharge status: Home / Self Care | Payer: Medicare Other | Attending: Emergency Medicine | Admitting: Emergency Medicine

## 2015-09-20 ENCOUNTER — Encounter (HOSPITAL_COMMUNITY): Payer: Self-pay | Admitting: Emergency Medicine

## 2015-09-20 DIAGNOSIS — D649 Anemia, unspecified: Secondary | ICD-10-CM | POA: Insufficient documentation

## 2015-09-20 DIAGNOSIS — R42 Dizziness and giddiness: Secondary | ICD-10-CM | POA: Diagnosis not present

## 2015-09-20 DIAGNOSIS — Z8739 Personal history of other diseases of the musculoskeletal system and connective tissue: Secondary | ICD-10-CM | POA: Insufficient documentation

## 2015-09-20 DIAGNOSIS — R0602 Shortness of breath: Secondary | ICD-10-CM | POA: Insufficient documentation

## 2015-09-20 DIAGNOSIS — Z85828 Personal history of other malignant neoplasm of skin: Secondary | ICD-10-CM | POA: Diagnosis not present

## 2015-09-20 DIAGNOSIS — Z7901 Long term (current) use of anticoagulants: Secondary | ICD-10-CM | POA: Insufficient documentation

## 2015-09-20 DIAGNOSIS — Z9889 Other specified postprocedural states: Secondary | ICD-10-CM | POA: Insufficient documentation

## 2015-09-20 DIAGNOSIS — I5033 Acute on chronic diastolic (congestive) heart failure: Secondary | ICD-10-CM | POA: Diagnosis not present

## 2015-09-20 DIAGNOSIS — I509 Heart failure, unspecified: Secondary | ICD-10-CM | POA: Insufficient documentation

## 2015-09-20 DIAGNOSIS — K219 Gastro-esophageal reflux disease without esophagitis: Secondary | ICD-10-CM | POA: Insufficient documentation

## 2015-09-20 DIAGNOSIS — E785 Hyperlipidemia, unspecified: Secondary | ICD-10-CM | POA: Insufficient documentation

## 2015-09-20 DIAGNOSIS — I4891 Unspecified atrial fibrillation: Secondary | ICD-10-CM | POA: Insufficient documentation

## 2015-09-20 DIAGNOSIS — E039 Hypothyroidism, unspecified: Secondary | ICD-10-CM | POA: Diagnosis not present

## 2015-09-20 DIAGNOSIS — Z79899 Other long term (current) drug therapy: Secondary | ICD-10-CM | POA: Diagnosis not present

## 2015-09-20 DIAGNOSIS — Z8619 Personal history of other infectious and parasitic diseases: Secondary | ICD-10-CM | POA: Diagnosis not present

## 2015-09-20 DIAGNOSIS — Z87828 Personal history of other (healed) physical injury and trauma: Secondary | ICD-10-CM | POA: Diagnosis not present

## 2015-09-20 DIAGNOSIS — I10 Essential (primary) hypertension: Secondary | ICD-10-CM | POA: Diagnosis not present

## 2015-09-20 DIAGNOSIS — Z792 Long term (current) use of antibiotics: Secondary | ICD-10-CM | POA: Diagnosis not present

## 2015-09-20 DIAGNOSIS — Z950 Presence of cardiac pacemaker: Secondary | ICD-10-CM | POA: Insufficient documentation

## 2015-09-20 DIAGNOSIS — R079 Chest pain, unspecified: Secondary | ICD-10-CM | POA: Diagnosis present

## 2015-09-20 LAB — CBC WITH DIFFERENTIAL/PLATELET
BASOS ABS: 0 10*3/uL (ref 0.0–0.1)
BASOS PCT: 0 %
EOS ABS: 0.1 10*3/uL (ref 0.0–0.7)
EOS PCT: 1 %
HCT: 39.3 % (ref 36.0–46.0)
Hemoglobin: 12.7 g/dL (ref 12.0–15.0)
Lymphocytes Relative: 15 %
Lymphs Abs: 0.8 10*3/uL (ref 0.7–4.0)
MCH: 31.9 pg (ref 26.0–34.0)
MCHC: 32.3 g/dL (ref 30.0–36.0)
MCV: 98.7 fL (ref 78.0–100.0)
MONO ABS: 0.3 10*3/uL (ref 0.1–1.0)
Monocytes Relative: 6 %
Neutro Abs: 3.8 10*3/uL (ref 1.7–7.7)
Neutrophils Relative %: 78 %
PLATELETS: 147 10*3/uL — AB (ref 150–400)
RBC: 3.98 MIL/uL (ref 3.87–5.11)
RDW: 13.9 % (ref 11.5–15.5)
WBC: 5 10*3/uL (ref 4.0–10.5)

## 2015-09-20 LAB — I-STAT TROPONIN, ED: TROPONIN I, POC: 0.03 ng/mL (ref 0.00–0.08)

## 2015-09-20 LAB — TROPONIN I: Troponin I: 0.03 ng/mL (ref <0.031)

## 2015-09-20 LAB — BRAIN NATRIURETIC PEPTIDE: B NATRIURETIC PEPTIDE 5: 181.4 pg/mL — AB (ref 0.0–100.0)

## 2015-09-20 LAB — I-STAT CG4 LACTIC ACID, ED: LACTIC ACID, VENOUS: 1.56 mmol/L (ref 0.5–2.0)

## 2015-09-20 MED ORDER — OXYCODONE-ACETAMINOPHEN 5-325 MG PO TABS
1.0000 | ORAL_TABLET | Freq: Once | ORAL | Status: AC
  Administered 2015-09-20: 1 via ORAL
  Filled 2015-09-20: qty 1
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
## 2015-09-20 NOTE — Discharge Instructions (Signed)
Nonspecific Chest Pain  °Chest pain can be caused by many different conditions. There is always a chance that your pain could be related to something serious, such as a heart attack or a blood clot in your lungs. Chest pain can also be caused by conditions that are not life-threatening. If you have chest pain, it is very important to follow up with your health care provider. °CAUSES  °Chest pain can be caused by: °· Heartburn. °· Pneumonia or bronchitis. °· Anxiety or stress. °· Inflammation around your heart (pericarditis) or lung (pleuritis or pleurisy). °· A blood clot in your lung. °· A collapsed lung (pneumothorax). It can develop suddenly on its own (spontaneous pneumothorax) or from trauma to the chest. °· Shingles infection (varicella-zoster virus). °· Heart attack. °· Damage to the bones, muscles, and cartilage that make up your chest wall. This can include: °¨ Bruised bones due to injury. °¨ Strained muscles or cartilage due to frequent or repeated coughing or overwork. °¨ Fracture to one or more ribs. °¨ Sore cartilage due to inflammation (costochondritis). °RISK FACTORS  °Risk factors for chest pain may include: °· Activities that increase your risk for trauma or injury to your chest. °· Respiratory infections or conditions that cause frequent coughing. °· Medical conditions or overeating that can cause heartburn. °· Heart disease or family history of heart disease. °· Conditions or health behaviors that increase your risk of developing a blood clot. °· Having had chicken pox (varicella zoster). °SIGNS AND SYMPTOMS °Chest pain can feel like: °· Burning or tingling on the surface of your chest or deep in your chest. °· Crushing, pressure, aching, or squeezing pain. °· Dull or sharp pain that is worse when you move, cough, or take a deep breath. °· Pain that is also felt in your back, neck, shoulder, or arm, or pain that spreads to any of these areas. °Your chest pain may come and go, or it may stay  constant. °DIAGNOSIS °Lab tests or other studies may be needed to find the cause of your pain. Your health care provider may have you take a test called an ambulatory ECG (electrocardiogram). An ECG records your heartbeat patterns at the time the test is performed. You may also have other tests, such as: °· Transthoracic echocardiogram (TTE). During echocardiography, sound waves are used to create a picture of all of the heart structures and to look at how blood flows through your heart. °· Transesophageal echocardiogram (TEE). This is a more advanced imaging test that obtains images from inside your body. It allows your health care provider to see your heart in finer detail. °· Cardiac monitoring. This allows your health care provider to monitor your heart rate and rhythm in real time. °· Holter monitor. This is a portable device that records your heartbeat and can help to diagnose abnormal heartbeats. It allows your health care provider to track your heart activity for several days, if needed. °· Stress tests. These can be done through exercise or by taking medicine that makes your heart beat more quickly. °· Blood tests. °· Imaging tests. °TREATMENT  °Your treatment depends on what is causing your chest pain. Treatment may include: °· Medicines. These may include: °¨ Acid blockers for heartburn. °¨ Anti-inflammatory medicine. °¨ Pain medicine for inflammatory conditions. °¨ Antibiotic medicine, if an infection is present. °¨ Medicines to dissolve blood clots. °¨ Medicines to treat coronary artery disease. °· Supportive care for conditions that do not require medicines. This may include: °¨ Resting. °¨ Applying heat   or cold packs to injured areas. °¨ Limiting activities until pain decreases. °HOME CARE INSTRUCTIONS °· If you were prescribed an antibiotic medicine, finish it all even if you start to feel better. °· Avoid any activities that bring on chest pain. °· Do not use any tobacco products, including  cigarettes, chewing tobacco, or electronic cigarettes. If you need help quitting, ask your health care provider. °· Do not drink alcohol. °· Take medicines only as directed by your health care provider. °· Keep all follow-up visits as directed by your health care provider. This is important. This includes any further testing if your chest pain does not go away. °· If heartburn is the cause for your chest pain, you may be told to keep your head raised (elevated) while sleeping. This reduces the chance that acid will go from your stomach into your esophagus. °· Make lifestyle changes as directed by your health care provider. These may include: °¨ Getting regular exercise. Ask your health care provider to suggest some activities that are safe for you. °¨ Eating a heart-healthy diet. A registered dietitian can help you to learn healthy eating options. °¨ Maintaining a healthy weight. °¨ Managing diabetes, if necessary. °¨ Reducing stress. °SEEK MEDICAL CARE IF: °· Your chest pain does not go away after treatment. °· You have a rash with blisters on your chest. °· You have a fever. °SEEK IMMEDIATE MEDICAL CARE IF:  °· Your chest pain is worse. °· You have an increasing cough, or you cough up blood. °· You have severe abdominal pain. °· You have severe weakness. °· You faint. °· You have chills. °· You have sudden, unexplained chest discomfort. °· You have sudden, unexplained discomfort in your arms, back, neck, or jaw. °· You have shortness of breath at any time. °· You suddenly start to sweat, or your skin gets clammy. °· You feel nauseous or you vomit. °· You suddenly feel light-headed or dizzy. °· Your heart begins to beat quickly, or it feels like it is skipping beats. °These symptoms may represent a serious problem that is an emergency. Do not wait to see if the symptoms will go away. Get medical help right away. Call your local emergency services (911 in the [place] Do not drive yourself to the hospital. °  °This  information is not intended to replace advice given to you by your health care provider. Make sure you discuss any questions you have with your health care provider. °  °Document Released: 08/10/2005 Document Revised: 11/21/2014 Document Reviewed: 06/06/2014 °Elsevier Interactive Patient Education ©2016 [employer] ° °
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
## 2015-09-20 NOTE — ED Notes (Signed)
Pt getting dressed, refused assistance.
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
## 2015-09-21 ENCOUNTER — Encounter (HOSPITAL_COMMUNITY): Payer: Self-pay | Admitting: Internal Medicine

## 2015-09-21 ENCOUNTER — Ambulatory Visit (HOSPITAL_COMMUNITY)
Admission: RE | Admit: 2015-09-21 | Discharge: 2015-09-21 | Disposition: A | Discharge status: Home / Self Care | Payer: Medicare Other | Source: Ambulatory Visit | Attending: Internal Medicine | Admitting: Internal Medicine

## 2015-09-21 VITALS — BP 124/68 | HR 86 | Wt 131.8 lb

## 2015-09-21 DIAGNOSIS — I5032 Chronic diastolic (congestive) heart failure: Secondary | ICD-10-CM | POA: Insufficient documentation

## 2015-09-21 DIAGNOSIS — I480 Paroxysmal atrial fibrillation: Secondary | ICD-10-CM | POA: Diagnosis not present

## 2015-09-21 DIAGNOSIS — R079 Chest pain, unspecified: Secondary | ICD-10-CM | POA: Diagnosis present

## 2015-09-21 DIAGNOSIS — I5042 Chronic combined systolic (congestive) and diastolic (congestive) heart failure: Secondary | ICD-10-CM | POA: Diagnosis not present

## 2015-09-21 DIAGNOSIS — E876 Hypokalemia: Secondary | ICD-10-CM | POA: Diagnosis not present

## 2015-09-21 DIAGNOSIS — I509 Heart failure, unspecified: Secondary | ICD-10-CM | POA: Diagnosis not present

## 2015-09-21 DIAGNOSIS — R072 Precordial pain: Secondary | ICD-10-CM | POA: Diagnosis not present

## 2015-09-21 DIAGNOSIS — N183 Chronic kidney disease, stage 3 (moderate): Secondary | ICD-10-CM | POA: Diagnosis not present
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
## 2015-09-21 NOTE — Patient Instructions (Signed)
FOLLOW UP: 3-4 months with Dr.Bensimhon

## 2015-09-22 ENCOUNTER — Telehealth: Payer: Self-pay | Admitting: Internal Medicine

## 2015-09-22 DIAGNOSIS — R072 Precordial pain: Secondary | ICD-10-CM | POA: Insufficient documentation

## 2015-09-22 NOTE — Telephone Encounter (Signed)
Ok to add on therapy visit s
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
## 2015-09-23 ENCOUNTER — Encounter: Payer: Self-pay | Admitting: Internal Medicine

## 2015-09-23 ENCOUNTER — Ambulatory Visit (INDEPENDENT_AMBULATORY_CARE_PROVIDER_SITE_OTHER): Payer: Medicare Other | Admitting: Internal Medicine

## 2015-09-23 VITALS — BP 124/60 | Temp 97.4°F | Ht 62.0 in | Wt 131.0 lb

## 2015-09-23 DIAGNOSIS — Z7901 Long term (current) use of anticoagulants: Secondary | ICD-10-CM | POA: Diagnosis not present

## 2015-09-23 DIAGNOSIS — R54 Age-related physical debility: Secondary | ICD-10-CM

## 2015-09-23 DIAGNOSIS — IMO0001 Reserved for inherently not codable concepts without codable children: Secondary | ICD-10-CM

## 2015-09-23 DIAGNOSIS — N183 Chronic kidney disease, stage 3 unspecified: Secondary | ICD-10-CM

## 2015-09-23 DIAGNOSIS — R42 Dizziness and giddiness: Secondary | ICD-10-CM | POA: Diagnosis not present

## 2015-09-23 DIAGNOSIS — I5042 Chronic combined systolic (congestive) and diastolic (congestive) heart failure: Secondary | ICD-10-CM

## 2015-09-23 DIAGNOSIS — M810 Age-related osteoporosis without current pathological fracture: Secondary | ICD-10-CM

## 2015-09-23 DIAGNOSIS — Z789 Other specified health status: Secondary | ICD-10-CM

## 2015-09-23 DIAGNOSIS — Z87898 Personal history of other specified conditions: Secondary | ICD-10-CM

## 2015-09-23 DIAGNOSIS — S298XXS Other specified injuries of thorax, sequela: Secondary | ICD-10-CM

## 2015-09-23 DIAGNOSIS — H6122 Impacted cerumen, left ear: Secondary | ICD-10-CM

## 2015-09-23 DIAGNOSIS — S299XXS Unspecified injury of thorax, sequela: Secondary | ICD-10-CM
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
## 2015-09-23 NOTE — Patient Instructions (Signed)
The fracture in the back is usually form osteoporosis.  And     Agree with   Starting the prolia .  Avoiding future fractures  Is helpful to your health.   Sometimes.. Dizziness is from medication or blood pressure too low . Wax in ear sometimes is an issue also .  Please be careful with pain medication   . Ok to take if needed but can cause dizziy feeling  And want to aovid falling .   It may take weeks for the breast bone rib area to feel better  But should heal.    Dizziness Dizziness is a common problem. It is a feeling of unsteadiness or light-headedness. You may feel like you are about to faint. Dizziness can lead to injury if you stumble or fall. Anyone can become dizzy, but dizziness is more common in older adults. This condition can be caused by a number of things, including medicines, dehydration, or illness. HOME CARE INSTRUCTIONS Taking these steps may help with your condition: Eating and Drinking  Drink enough fluid to keep your urine clear or pale yellow. This helps to keep you from becoming dehydrated. Try to drink more clear fluids, such as water.  Do not drink alcohol.  Limit your caffeine intake if directed by your health care provider.  Limit your salt intake if directed by your health care provider. Activity  Avoid making quick movements.  Rise slowly from chairs and steady yourself until you feel okay.  In the morning, first sit up on the side of the bed. When you feel okay, stand slowly while you hold onto something until you know that your balance is fine.  Move your legs often if you need to stand in one place for a long time. Tighten and relax your muscles in your legs while you are standing.  Do not drive or operate heavy machinery if you feel dizzy.  Avoid bending down if you feel dizzy. Place items in your home so that they are easy for you to reach without leaning over. Lifestyle  Do not use any tobacco products, including cigarettes, chewing  tobacco, or electronic cigarettes. If you need help quitting, ask your health care provider.  Try to reduce your stress level, such as with yoga or meditation. Talk with your health care provider if you need help. General Instructions  Watch your dizziness for any changes.  Take medicines only as directed by your health care provider. Talk with your health care provider if you think that your dizziness is caused by a medicine that you are taking.  Tell a friend or a family member that you are feeling dizzy. If he or she notices any changes in your behavior, have this person call your health care provider.  Keep all follow-up visits as directed by your health care provider. This is important. SEEK MEDICAL CARE IF:  Your dizziness does not go away.  Your dizziness or light-headedness gets worse.  You feel nauseous.  You have reduced hearing.  You have new symptoms.  You are unsteady on your feet or you feel like the room is spinning. SEEK IMMEDIATE MEDICAL CARE IF:  You vomit or have diarrhea and are unable to eat or drink anything.  You have problems talking, walking, swallowing, or using your arms, hands, or legs.  You feel generally weak.  You are not thinking clearly or you have trouble forming sentences. It may take a friend or family member to notice this.  You have  chest pain, abdominal pain, shortness of breath, or sweating.  Your vision changes.  You notice any bleeding.  You have a headache.  You have neck pain or a stiff neck.  You have a fever.   This information is not intended to replace advice given to you by your health care provider. Make sure you discuss any questions you have with your health care provider.   Document Released: 04/26/2001 Document Revised: 03/17/2015 Document Reviewed: 10/27/2014 Elsevier Interactive Patient Education 2016 [employer]. Osteoporosis Osteoporosis is the thinning and loss of density in the bones. Osteoporosis makes  the bones more brittle, fragile, and likely to break (fracture). Over time, osteoporosis can cause the bones to become so weak that they fracture after a simple fall. The bones most likely to fracture are the bones in the hip, wrist, and spine. CAUSES  The exact cause is not known. RISK FACTORS Anyone can develop osteoporosis. You may be at greater risk if you have a family history of the condition or have poor nutrition. You may also have a higher risk if you are:   Female.   41 years old or older.  A smoker.  Not physically active.   White or Asian.  Slender. SIGNS AND SYMPTOMS  A fracture might be the first sign of the disease, especially if it results from a fall or injury that would not usually cause a bone to break. Other signs and symptoms include:   Low back and neck pain.  Stooped posture.  Height loss. DIAGNOSIS  To make a diagnosis, your health care provider may:  Take a medical history.  Perform a physical exam.  Order tests, such as:  A bone mineral density test.  A dual-energy X-ray absorptiometry test. TREATMENT  The goal of osteoporosis treatment is to strengthen your bones to reduce your risk of a fracture. Treatment may involve:  Making lifestyle changes, such as:  Eating a diet rich in calcium.  Doing weight-bearing and muscle-strengthening exercises.  Stopping tobacco use.  Limiting alcohol intake.  Taking medicine to slow the process of bone loss or to increase bone density.  Monitoring your levels of calcium and vitamin D. HOME CARE INSTRUCTIONS  Include calcium and vitamin D in your diet. Calcium is important for bone health, and vitamin D helps the body absorb calcium.  Perform weight-bearing and muscle-strengthening exercises as directed by your health care provider.  Do not use any tobacco products, including cigarettes, chewing tobacco, and electronic cigarettes. If you need help quitting, ask your health care provider.  Limit  your alcohol intake.  Take medicines only as directed by your health care provider.  Keep all follow-up visits as directed by your health care provider. This is important.  Take precautions at home to lower your risk of falling, such as:  Keeping rooms well lit and clutter free.  Installing safety rails on stairs.  Using rubber mats in the bathroom and other areas that are often wet or slippery. SEEK IMMEDIATE MEDICAL CARE IF:  You fall or injure yourself.    This information is not intended to replace advice given to you by your health care provider. Make sure you discuss any questions you have with your health care provider.   Document Released: 08/10/2005 Document Revised: 11/21/2014 Document Reviewed: 04/10/2014 Elsevier Interactive Patient Education [employer].

## 2015-09-23 NOTE — Telephone Encounter (Signed)
Left a message for a return call.

## 2015-09-28 NOTE — Telephone Encounter (Signed)
Left a message for a return call.

## 2015-10-01 NOTE — Telephone Encounter (Signed)
Left a message for a return call.  Have tried multiple times to reach [place].  Will now close the note.

## 2015-10-03 ENCOUNTER — Other Ambulatory Visit: Payer: Self-pay | Admitting: Internal Medicine

## 2015-10-07 DIAGNOSIS — M1990 Unspecified osteoarthritis, unspecified site: Secondary | ICD-10-CM | POA: Diagnosis not present

## 2015-10-08 ENCOUNTER — Encounter (HOSPITAL_COMMUNITY): Payer: Self-pay | Admitting: *Deleted

## 2015-10-08 ENCOUNTER — Telehealth: Payer: Self-pay | Admitting: Physician Assistant

## 2015-10-08 ENCOUNTER — Emergency Department (HOSPITAL_COMMUNITY)
Admission: EM | Admit: 2015-10-08 | Discharge: 2015-10-08 | Disposition: A | Discharge status: Home / Self Care | Payer: Medicare Other | Attending: Emergency Medicine | Admitting: Emergency Medicine

## 2015-10-08 ENCOUNTER — Emergency Department (HOSPITAL_COMMUNITY): Payer: Medicare Other

## 2015-10-08 DIAGNOSIS — Z79899 Other long term (current) drug therapy: Secondary | ICD-10-CM | POA: Diagnosis not present

## 2015-10-08 DIAGNOSIS — K219 Gastro-esophageal reflux disease without esophagitis: Secondary | ICD-10-CM | POA: Insufficient documentation

## 2015-10-08 DIAGNOSIS — R0789 Other chest pain: Secondary | ICD-10-CM | POA: Insufficient documentation

## 2015-10-08 DIAGNOSIS — E785 Hyperlipidemia, unspecified: Secondary | ICD-10-CM | POA: Insufficient documentation

## 2015-10-08 DIAGNOSIS — I10 Essential (primary) hypertension: Secondary | ICD-10-CM | POA: Diagnosis not present

## 2015-10-08 DIAGNOSIS — M1990 Unspecified osteoarthritis, unspecified site: Secondary | ICD-10-CM | POA: Diagnosis not present

## 2015-10-08 DIAGNOSIS — D649 Anemia, unspecified: Secondary | ICD-10-CM | POA: Diagnosis not present

## 2015-10-08 DIAGNOSIS — Z950 Presence of cardiac pacemaker: Secondary | ICD-10-CM | POA: Insufficient documentation

## 2015-10-08 DIAGNOSIS — Z85828 Personal history of other malignant neoplasm of skin: Secondary | ICD-10-CM | POA: Insufficient documentation

## 2015-10-08 DIAGNOSIS — Z8619 Personal history of other infectious and parasitic diseases: Secondary | ICD-10-CM | POA: Insufficient documentation

## 2015-10-08 DIAGNOSIS — Z7901 Long term (current) use of anticoagulants: Secondary | ICD-10-CM | POA: Diagnosis not present

## 2015-10-08 DIAGNOSIS — I4891 Unspecified atrial fibrillation: Secondary | ICD-10-CM | POA: Insufficient documentation

## 2015-10-08 DIAGNOSIS — Z9889 Other specified postprocedural states: Secondary | ICD-10-CM | POA: Insufficient documentation

## 2015-10-08 DIAGNOSIS — I5033 Acute on chronic diastolic (congestive) heart failure: Secondary | ICD-10-CM | POA: Insufficient documentation

## 2015-10-08 DIAGNOSIS — Z792 Long term (current) use of antibiotics: Secondary | ICD-10-CM | POA: Diagnosis not present

## 2015-10-08 DIAGNOSIS — E039 Hypothyroidism, unspecified: Secondary | ICD-10-CM | POA: Diagnosis not present

## 2015-10-08 DIAGNOSIS — R079 Chest pain, unspecified: Secondary | ICD-10-CM | POA: Diagnosis present

## 2015-10-08 LAB — I-STAT TROPONIN, ED
TROPONIN I, POC: 0.04 ng/mL (ref 0.00–0.08)
Troponin i, poc: 0.03 ng/mL (ref 0.00–0.08)

## 2015-10-08 LAB — BASIC METABOLIC PANEL
Anion gap: 6 (ref 5–15)
BUN: 18 mg/dL (ref 6–20)
CHLORIDE: 108 mmol/L (ref 101–111)
CO2: 26 mmol/L (ref 22–32)
Calcium: 8.2 mg/dL — ABNORMAL LOW (ref 8.9–10.3)
Creatinine, Ser: 1.85 mg/dL — ABNORMAL HIGH (ref 0.44–1.00)
GFR calc Af Amer: 27 mL/min — ABNORMAL LOW (ref >60)
GFR calc non Af Amer: 23 mL/min — ABNORMAL LOW (ref >60)
Glucose, Bld: 126 mg/dL — ABNORMAL HIGH (ref 65–99)
POTASSIUM: 3.9 mmol/L (ref 3.5–5.1)
SODIUM: 140 mmol/L (ref 135–145)

## 2015-10-08 LAB — CBC
HEMATOCRIT: 37.3 % (ref 36.0–46.0)
Hemoglobin: 11.9 g/dL — ABNORMAL LOW (ref 12.0–15.0)
MCH: 31.8 pg (ref 26.0–34.0)
MCHC: 31.9 g/dL (ref 30.0–36.0)
MCV: 99.7 fL (ref 78.0–100.0)
Platelets: 124 10*3/uL — ABNORMAL LOW (ref 150–400)
RBC: 3.74 MIL/uL — ABNORMAL LOW (ref 3.87–5.11)
RDW: 13.8 % (ref 11.5–15.5)
WBC: 4.8 10*3/uL (ref 4.0–10.5)

## 2015-10-08 NOTE — ED Notes (Signed)
Pt arrives from [facility] house via [place] c/o central chest pain rating it a 8/10 on pain scale. Pt states that she had some SOB, lightheadness and dizziness. Nitro x 3 (1 prior to EMS arrival and 2 enroute by EMS) given PTA and 324mg  ASA given PTA.

## 2015-10-08 NOTE — Discharge Instructions (Signed)
Tylenol or Motrin as needed for pain.  Return to the ER if symptoms significantly worsen or change.   Chest Wall Pain Chest wall pain is pain in or around the bones and muscles of your chest. Sometimes, an injury causes this pain. Sometimes, the cause may not be known. This pain may take several weeks or longer to get better. HOME CARE INSTRUCTIONS  Pay attention to any changes in your symptoms. Take these actions to help with your pain:   Rest as told by your health care provider.   Avoid activities that cause pain. These include any activities that use your chest muscles or your abdominal and side muscles to lift heavy items.   If directed, apply ice to the painful area:  Put ice in a plastic bag.  Place a towel between your skin and the bag.  Leave the ice on for 20 minutes, 2-3 times per day.  Take over-the-counter and prescription medicines only as told by your health care provider.  Do not use tobacco products, including cigarettes, chewing tobacco, and e-cigarettes. If you need help quitting, ask your health care provider.  Keep all follow-up visits as told by your health care provider. This is important. SEEK MEDICAL CARE IF:  You have a fever.  Your chest pain becomes worse.  You have new symptoms. SEEK IMMEDIATE MEDICAL CARE IF:  You have nausea or vomiting.  You feel sweaty or light-headed.  You have a cough with phlegm (sputum) or you cough up blood.  You develop shortness of breath.   This information is not intended to replace advice given to you by your health care provider. Make sure you discuss any questions you have with your health care provider.   Document Released: 10/31/2005 Document Revised: 07/22/2015 Document Reviewed: 01/26/2015 Elsevier Interactive Patient Education [employer].

## 2015-10-08 NOTE — Telephone Encounter (Signed)
Patient contacted the after hour service for advise, she has h/o afib, CKD, HTN, chronic combined systolic and diastolic HF and nonobstructive CAD.   She had Left cath in 01/2014:  Left mainstem: The mid left main has 20-30% stenosis and there is moderate calcification noted. Left anterior descending (LAD): There is mild diffuse plaquing without significant stenosis. The first diagonal is patent. Left circumflex (LCx): Left circumflex is patent. The first OM is patent without significant stenosis. Right coronary artery (RCA): This is a large, dominant vessel. The vessel is moderately calcified. The mid vessel has 30-40% stenosis.   [place] 09/22/2014  RA = 11 RV = 49/6/10 PA = 49/22 (34) PCW = 18 (v = 25) Fick cardiac output/index = 6.4/4.0 PVR = 2.5 WU FA sat = 95% PA sat = 72%,74%  St Jude PPM placement 04/2014 by Dr. Lovena Le  She says her PPM has been beeping twice today, and this is the first time it ever happened. She has also been having some mild discomfort, esp when she's anxious(?). Given PPM only placed 1 yr and 5 month ago, I seriously doubt battery issue. I think her CP is a different issue from her PPM going off. She says EMS arrived, EKG by EMS was reassuring, EMS offered to take her to ED, she declined and preferred not to at this time. I told her unfortunately without interrogating the device, I am not sure what's going on with her device, since she does not have any dizziness or SOB, I told her she can monitor for now and potentially call the office tomorrow to see if they can interrogat it. She is currently not having CP, but I am concerned. I instructed her to come to ED if CP recur or if she has dizziness, SOB and any worsening symptom.  Hilbert Corrigan PA Pager: [phone]

## 2015-10-08 NOTE — ED Provider Notes (Signed)
CSN: [id number]     Arrival date & time 10/08/15  1350 History   First MD Initiated Contact with Patient 10/08/15 1506     Chief Complaint  Patient presents with  . Chest Pain     (Consider location/radiation/quality/duration/timing/severity/associated sxs/prior Treatment) HPI Comments: Patient is an 81 year old female with history of congestive heart failure and pacemaker placement. She presents for evaluation of chest pain. This chest discomfort began this morning shortly after waking from sleep and has occurred intermittently throughout the morning. She denies any shortness of breath, diaphoresis, nausea, or radiation.  She has had multiple ER visits and multiple hospitalizations over the past several years for similar complaints. She had a heart cath in 2015 and a Lexi scan/Myoview in May 2016. Both were reassuring. She is followed by Dr. Sung Amabile.  Patient is a 81 y.o. female presenting with chest pain. The history is provided by the patient.  Chest Pain Pain location:  Substernal area Pain quality: sharp   Pain radiates to:  Does not radiate Pain radiates to the back: no   Pain severity:  Moderate Onset quality:  Sudden Duration:  6 hours Timing:  Intermittent Progression:  Partially resolved Chronicity:  New   Past Medical History  Diagnosis Date  . Dyslipidemia   . Hypertension   . Palpitations     PVCs/bigeminy on event in May 2009 revealing this was relatively asymptomatic  . Chest pain     a. 2011 Neg MV;  b. 01/2014 Cath: LM 20-30, LAD nl, D1 nl, LCX nl, OM1 nl, RCA dom 30-25m, PD/PL nl, EF 65%->Med Rx.  . Degenerative joint disease   . Hx of varicella   . History of skin cancer     tafeen/ non melanoma.   . Thrombocytopenia ([place])   . Anemia   . Monoclonal gammopathies   . Right lower quadrant abdominal pain 07/19/2012    Recurrent  With nausea    This is new since she had ct for llq pain in [place]    R/o appendiceal problem  Hernia  Get ct scan  And plan  Fu      . Diastolic CHF, acute on chronic ([place]) 01/31/2012  . Atrial fibrillation ([place])     a. Dx 01/2014->Eliquis started.  . Cancer ([place])   . Pacemaker   . Dysrhythmia   . Family history of adverse reaction to anesthesia     " multiple family members have difficulty waking "  . GERD (gastroesophageal reflux disease)   . Hypothyroidism    Past Surgical History  Procedure Laterality Date  . Cholecystectomy  1999  . Cesarean section      times 2  . Shoulder surgery  1996    Right   . Cataract extraction      Bilateral  implantt  . Cardioversion N/A 02/26/2014    Procedure: CARDIOVERSION AT BEDSIDE;  Surgeon: Pixie Casino, MD;  Location: [place];  Service: Cardiovascular;  Laterality: N/A;  . Cardioversion N/A 04/22/2014    Procedure: CARDIOVERSION (BEDSIDE);  Surgeon: [name], MD;  [facility];  Service: Cardiovascular;  Laterality: N/A;  . Pacemaker insertion  04/23/2014    STJ Assurity dual chamber pacemaker implanted by Dr Rayann Heman  . Insert / replace / remove pacemaker    . Esophagogastroduodenoscopy N/A 07/06/2014    Procedure: ESOPHAGOGASTRODUODENOSCOPY (EGD);  Surgeon: Ladene Artist, MD;  Location[facility] ENDOSCOPY;  Service: Endoscopy;  Laterality: N/A;  . Left heart catheterization with coronary angiogram N/A 01/29/2014  Procedure: LEFT HEART CATHETERIZATION WITH CORONARY ANGIOGRAM;  Surgeon: Blane Ohara, MD;  Location: [facility] CATH LAB;  Service: Cardiovascular;  Laterality: N/A;  . Permanent pacemaker insertion N/A 04/23/2014    Procedure: PERMANENT PACEMAKER INSERTION;  Surgeon: Evans Lance, MD;  Location: [facility] CATH LAB;  Service: Cardiovascular;  Laterality: N/A;  . Right heart catheterization N/A 09/22/2014    Procedure: RIGHT HEART CATH;  Surgeon: Jolaine Artist, MD;  Location: [facility] CATH LAB;  Service: Cardiovascular;  Laterality: N/A;   Family History  Problem Relation Age of Onset  . Coronary artery disease Father   . Heart disease Father   . Lung cancer    .  Alzheimer's disease Mother   . Cancer Brother 43    lung cancer  . Heart attack Father    Social History  Substance Use Topics  . Smoking status: Never Smoker   . Smokeless tobacco: Never Used  . Alcohol Use: No   OB History    No data available     Review of Systems  Cardiovascular: Positive for chest pain.  All other systems reviewed and are negative.     Allergies  Other; Tramadol; and Ibuprofen  Home Medications   Prior to Admission medications   Medication Sig Start Date End Date Taking? Authorizing Provider  acetaminophen (TYLENOL) 325 MG tablet Take 2 tablets (650 mg total) by mouth every 4 (four) hours as needed for headache or mild pain. Patient not taking: Reported on 09/23/2015 09/26/14   Isaiah Serge, NP  amiodarone (PACERONE) 200 MG tablet Take 1 tablet (200 mg total) by mouth daily. 11/06/14   Jolaine Artist, MD  apixaban (ELIQUIS) 2.5 MG TABS tablet Take 1 tablet (2.5 mg total) by mouth 2 (two) times daily. 08/03/15   Jolaine Artist, MD  erythromycin ophthalmic ointment Place 1 application into both eyes 2 (two) times daily. 05/19/15   Burnis Medin, MD  ferrous sulfate 325 (65 FE) MG tablet TAKE ONE TABLET BY MOUTH TWICE DAILY WITH A MEAL 06/02/15   Burnis Medin, MD  levothyroxine (SYNTHROID, LEVOTHROID) 75 MCG tablet TAKE ONE TABLET BY MOUTH ONCE DAILY 07/17/15   Burnis Medin, MD  loratadine (CLARITIN) 10 MG tablet Take 10 mg by mouth daily as needed for allergies or rhinitis.     Historical Provider, MD  metolazone (ZAROXOLYN) 2.5 MG tablet Take 1 tablet (2.5 mg total) by mouth as needed (for weight 136 lb or greater). Patient not taking: Reported on 09/23/2015 12/16/14   Jolaine Artist, MD  nitroGLYCERIN (NITROSTAT) 0.4 MG SL tablet Place 1 tablet (0.4 mg total) under the tongue every 5 (five) minutes as needed. For chest pain 12/02/13   Fay Records, MD  oxyCODONE-acetaminophen (PERCOCET/ROXICET) 5-325 MG per tablet Take 0.5-1 tablets by mouth every  6 (six) hours as needed for severe pain. 07/31/15   Marin Olp, MD  pantoprazole (PROTONIX) 40 MG tablet TAKE ONE TABLET BY MOUTH ONCE DAILY 10/05/15   Burnis Medin, MD  potassium chloride SA (K-DUR,KLOR-CON) 20 MEQ tablet Take 60 mEq by mouth daily.    Historical Provider, MD  simvastatin (ZOCOR) 10 MG tablet TAKE ONE TABLET BY MOUTH AT BEDTIME. 09/11/15   Jolaine Artist, MD  torsemide (DEMADEX) 20 MG tablet Take 40 mg by mouth daily.    Historical Provider, MD   BP 91/61 mmHg  Pulse 69  Temp(Src) 98.4 F (36.9 C) (Oral)  Resp 15  Ht 5\' 1"  (1.549  m)  Wt 129 lb (58.514 kg)  BMI 24.39 kg/m2  SpO2 96% Physical Exam  Constitutional: She is oriented to person, place, and time. She appears well-developed and well-nourished. No distress.  HENT:  Head: Normocephalic and atraumatic.  Neck: Normal range of motion. Neck supple.  Cardiovascular: Normal rate and regular rhythm.  Exam reveals no gallop and no friction rub.   No murmur heard. Pulmonary/Chest: Effort normal and breath sounds normal. No respiratory distress. She has no wheezes. She exhibits tenderness.  There is tenderness to palpation of the anterior chest wall.  Abdominal: Soft. Bowel sounds are normal. She exhibits no distension. There is no tenderness.  Musculoskeletal: Normal range of motion. She exhibits no edema.  Neurological: She is alert and oriented to person, place, and time.  Skin: Skin is warm and dry. She is not diaphoretic.  Nursing note and vitals reviewed.   ED Course  Procedures (including critical care time) Labs Review Labs Reviewed  BASIC METABOLIC PANEL - Abnormal; Notable for the following:    Glucose, Bld 126 (*)    Creatinine, Ser 1.85 (*)    Calcium 8.2 (*)    GFR calc non Af Amer 23 (*)    GFR calc Af Amer 27 (*)    All other components within normal limits  CBC - Abnormal; Notable for the following:    RBC 3.74 (*)    Hemoglobin 11.9 (*)    Platelets 124 (*)    All other  components within normal limits  I-STAT TROPOININ, ED    Imaging Review Dg Chest 2 View  10/08/2015  CLINICAL DATA:  Chest pain. EXAM: CHEST  2 VIEW COMPARISON:  09/20/2015 and 09/18/2015 FINDINGS: Heart size and pulmonary vascularity are normal. Pacemaker in place. Minimal scarring at the left lung base. No infiltrates or effusions. No acute osseous abnormality. IMPRESSION: No active cardiopulmonary disease. Electronically Signed   By: Lorriane Shire M.D.   On: 10/08/2015 14:32   I have personally reviewed and evaluated these images and lab results as part of my medical decision-making.   EKG Interpretation   Date/Time:  Thursday October 08 2015 14:00:24 EST Ventricular Rate:  75 PR Interval:  173 QRS Duration: 157 QT Interval:  461 QTC Calculation: 515 R Axis:   115 Text Interpretation:  Atrial-ventricular dual-paced rhythm No further  analysis attempted due to paced rhythm Confirmed by Rilla Buckman  MD, [place]  ([id number]) on 10/08/2015 3:07:22 PM      MDM   Final diagnoses:  None    Cardiac workup is thus far unremarkable. Her troponins are both negative and EKG is unchanged. She has had multiple workups in the chest pain in the past, with her most recent cath in 2015 and a Myoview in 2016. I feel confident based on her presentation, physical exam, and prior workup that this is not cardiac and strongly favor a chest wall etiology. She will be discharged to home with instructions to take Tylenol or ibuprofen as needed for pain. She is to return as needed for any problems.    Veryl Speak, MD 10/08/15 [phone]

## 2015-10-12 ENCOUNTER — Telehealth (HOSPITAL_COMMUNITY): Payer: Self-pay | Admitting: *Deleted

## 2015-10-12 NOTE — Telephone Encounter (Signed)
The day before Thanksgiving her pacemaker made a really loud noise. Patient feels fine.  Pt says that her hr is staying around the 70's Will forward to Dr Lovena Le and his nurse. Pt said that she would call his office tomorrow for advice.
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
## 2015-10-17 ENCOUNTER — Other Ambulatory Visit (HOSPITAL_COMMUNITY): Payer: Self-pay | Admitting: Internal Medicine

## 2015-10-17 ENCOUNTER — Other Ambulatory Visit: Payer: Self-pay | Admitting: Cardiology

## 2015-10-19 NOTE — Telephone Encounter (Signed)
Rx request sent to pharmacy.  

## 2015-10-21 ENCOUNTER — Ambulatory Visit (INDEPENDENT_AMBULATORY_CARE_PROVIDER_SITE_OTHER): Payer: Medicare Other | Admitting: Internal Medicine

## 2015-10-21 ENCOUNTER — Encounter: Payer: Self-pay | Admitting: Internal Medicine

## 2015-10-21 VITALS — BP 142/68 | HR 70 | Ht 61.0 in | Wt 124.0 lb

## 2015-10-21 DIAGNOSIS — I480 Paroxysmal atrial fibrillation: Secondary | ICD-10-CM

## 2015-10-21 DIAGNOSIS — Z950 Presence of cardiac pacemaker: Secondary | ICD-10-CM

## 2015-10-21 DIAGNOSIS — I10 Essential (primary) hypertension: Secondary | ICD-10-CM

## 2015-10-21 DIAGNOSIS — I5042 Chronic combined systolic (congestive) and diastolic (congestive) heart failure: Secondary | ICD-10-CM

## 2015-10-21 LAB — CUP PACEART INCLINIC DEVICE CHECK
Battery Voltage: 2.99 V
Implantable Lead Implant Date: 20150610
Implantable Lead Model: 5076
Implantable Lead Model: 5076
Lead Channel Impedance Value: 450 Ohm
Lead Channel Pacing Threshold Amplitude: 0.5 V
Lead Channel Pacing Threshold Amplitude: 1.25 V
Lead Channel Pacing Threshold Amplitude: 1.25 V
Lead Channel Pacing Threshold Pulse Width: 0.4 ms
Lead Channel Pacing Threshold Pulse Width: 0.4 ms
Lead Channel Sensing Intrinsic Amplitude: 12 mV
Lead Channel Sensing Intrinsic Amplitude: 2.9 mV
Lead Channel Setting Pacing Amplitude: 2.5 V
Lead Channel Setting Pacing Pulse Width: 0.4 ms
MDC IDC LEAD IMPLANT DT: 20150610
MDC IDC LEAD LOCATION: 753859
MDC IDC LEAD LOCATION: 753860
MDC IDC MSMT BATTERY REMAINING LONGEVITY: 94.8
MDC IDC MSMT LEADCHNL RA PACING THRESHOLD AMPLITUDE: 0.5 V
MDC IDC MSMT LEADCHNL RA PACING THRESHOLD PULSEWIDTH: 0.4 ms
MDC IDC MSMT LEADCHNL RA PACING THRESHOLD PULSEWIDTH: 0.4 ms
MDC IDC MSMT LEADCHNL RV IMPEDANCE VALUE: 512.5 Ohm
MDC IDC SESS DTM: 20161207152931
MDC IDC SET LEADCHNL RA PACING AMPLITUDE: 2 V
MDC IDC SET LEADCHNL RV SENSING SENSITIVITY: 2 mV
MDC IDC STAT BRADY RA PERCENT PACED: 99 %
MDC IDC STAT BRADY RV PERCENT PACED: 99.33 %
Pulse Gen Model: 2240
Pulse Gen Serial Number: 7627922

## 2015-10-21 NOTE — Assessment & Plan Note (Signed)
Her blood pressure is well controlled. No change in meds. 

## 2015-10-21 NOTE — Patient Instructions (Signed)
Medication Instructions:  Your physician recommends that you continue on your current medications as directed. Please refer to the Current Medication list given to you today.  Labwork: None ordered  Testing/Procedures: None ordered  Follow-Up: Remote monitoring is used to monitor your Pacemaker of ICD from home. This monitoring reduces the number of office visits required to check your device to one time per year. It allows us to keep an eye on the functioning of your device to ensure it is working properly. You are scheduled for a device check from home on 01/20/16. You may send your transmission at any time that day. If you have a wireless device, the transmission will be sent automatically. After your physician reviews your transmission, you will receive a postcard with your next transmission date.  Your physician wants you to follow-up in: 1 year with Dr. Taylor.  You will receive a reminder letter in the mail two months in advance. If you don't receive a letter, please call our office to schedule the follow-up appointment.  If you need a refill on your cardiac medications before your next appointment, please call your pharmacy.  Thank you for choosing [facility]!!         
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
## 2015-10-21 NOTE — Assessment & Plan Note (Signed)
Her symptoms are class 2. She is fairly sedentary.

## 2015-10-21 NOTE — Assessment & Plan Note (Signed)
Her St. Jude device is working normally. Will recheck in several months. 

## 2015-10-22 ENCOUNTER — Other Ambulatory Visit: Payer: Self-pay | Admitting: Cardiology

## 2015-10-28 ENCOUNTER — Other Ambulatory Visit (HOSPITAL_COMMUNITY): Payer: Self-pay | Admitting: *Deleted

## 2015-10-28 MED ORDER — TORSEMIDE 20 MG PO TABS: 40.0000 mg | ORAL_TABLET | Freq: Every day | ORAL | Status: DC

## 2015-11-04 ENCOUNTER — Emergency Department (HOSPITAL_COMMUNITY)
Admission: EM | Admit: 2015-11-04 | Discharge: 2015-11-04 | Disposition: A | Discharge status: Home / Self Care | Payer: Medicare Other | Attending: Emergency Medicine | Admitting: Emergency Medicine

## 2015-11-04 ENCOUNTER — Emergency Department (HOSPITAL_COMMUNITY): Payer: Medicare Other

## 2015-11-04 ENCOUNTER — Encounter (HOSPITAL_COMMUNITY): Payer: Self-pay | Admitting: *Deleted

## 2015-11-04 DIAGNOSIS — D649 Anemia, unspecified: Secondary | ICD-10-CM | POA: Insufficient documentation

## 2015-11-04 DIAGNOSIS — I4891 Unspecified atrial fibrillation: Secondary | ICD-10-CM | POA: Insufficient documentation

## 2015-11-04 DIAGNOSIS — Z9889 Other specified postprocedural states: Secondary | ICD-10-CM | POA: Insufficient documentation

## 2015-11-04 DIAGNOSIS — Z8619 Personal history of other infectious and parasitic diseases: Secondary | ICD-10-CM | POA: Diagnosis not present

## 2015-11-04 DIAGNOSIS — Z7901 Long term (current) use of anticoagulants: Secondary | ICD-10-CM | POA: Diagnosis not present

## 2015-11-04 DIAGNOSIS — Z79899 Other long term (current) drug therapy: Secondary | ICD-10-CM | POA: Insufficient documentation

## 2015-11-04 DIAGNOSIS — Z7902 Long term (current) use of antithrombotics/antiplatelets: Secondary | ICD-10-CM | POA: Insufficient documentation

## 2015-11-04 DIAGNOSIS — E785 Hyperlipidemia, unspecified: Secondary | ICD-10-CM | POA: Diagnosis not present

## 2015-11-04 DIAGNOSIS — R1013 Epigastric pain: Secondary | ICD-10-CM | POA: Insufficient documentation

## 2015-11-04 DIAGNOSIS — Z7982 Long term (current) use of aspirin: Secondary | ICD-10-CM | POA: Diagnosis not present

## 2015-11-04 DIAGNOSIS — Z8739 Personal history of other diseases of the musculoskeletal system and connective tissue: Secondary | ICD-10-CM | POA: Diagnosis not present

## 2015-11-04 DIAGNOSIS — I10 Essential (primary) hypertension: Secondary | ICD-10-CM | POA: Insufficient documentation

## 2015-11-04 DIAGNOSIS — Z85828 Personal history of other malignant neoplasm of skin: Secondary | ICD-10-CM | POA: Insufficient documentation

## 2015-11-04 DIAGNOSIS — Z950 Presence of cardiac pacemaker: Secondary | ICD-10-CM | POA: Diagnosis not present

## 2015-11-04 DIAGNOSIS — K219 Gastro-esophageal reflux disease without esophagitis: Secondary | ICD-10-CM | POA: Diagnosis not present

## 2015-11-04 DIAGNOSIS — E039 Hypothyroidism, unspecified: Secondary | ICD-10-CM | POA: Insufficient documentation

## 2015-11-04 DIAGNOSIS — R079 Chest pain, unspecified: Secondary | ICD-10-CM | POA: Diagnosis present

## 2015-11-04 DIAGNOSIS — Z792 Long term (current) use of antibiotics: Secondary | ICD-10-CM | POA: Diagnosis not present

## 2015-11-04 DIAGNOSIS — I5033 Acute on chronic diastolic (congestive) heart failure: Secondary | ICD-10-CM | POA: Diagnosis not present

## 2015-11-04 LAB — COMPREHENSIVE METABOLIC PANEL
ALBUMIN: 3.1 g/dL — AB (ref 3.5–5.0)
ALT: 14 U/L (ref 14–54)
AST: 16 U/L (ref 15–41)
Alkaline Phosphatase: 89 U/L (ref 38–126)
Anion gap: 8 (ref 5–15)
BUN: 21 mg/dL — AB (ref 6–20)
CHLORIDE: 102 mmol/L (ref 101–111)
CO2: 26 mmol/L (ref 22–32)
Calcium: 9 mg/dL (ref 8.9–10.3)
Creatinine, Ser: 1.76 mg/dL — ABNORMAL HIGH (ref 0.44–1.00)
GFR calc Af Amer: 29 mL/min — ABNORMAL LOW (ref >60)
GFR calc non Af Amer: 25 mL/min — ABNORMAL LOW (ref >60)
GLUCOSE: 115 mg/dL — AB (ref 65–99)
POTASSIUM: 4.1 mmol/L (ref 3.5–5.1)
SODIUM: 136 mmol/L (ref 135–145)
Total Bilirubin: 0.7 mg/dL (ref 0.3–1.2)
Total Protein: 6.1 g/dL — ABNORMAL LOW (ref 6.5–8.1)

## 2015-11-04 LAB — TROPONIN I
Troponin I: 0.03 ng/mL (ref <0.031)
Troponin I: 0.03 ng/mL (ref <0.031)

## 2015-11-04 LAB — CBC
HCT: 37.4 % (ref 36.0–46.0)
Hemoglobin: 11.9 g/dL — ABNORMAL LOW (ref 12.0–15.0)
MCH: 30.9 pg (ref 26.0–34.0)
MCHC: 31.8 g/dL (ref 30.0–36.0)
MCV: 97.1 fL (ref 78.0–100.0)
PLATELETS: 146 10*3/uL — AB (ref 150–400)
RBC: 3.85 MIL/uL — ABNORMAL LOW (ref 3.87–5.11)
RDW: 13.4 % (ref 11.5–15.5)
WBC: 4.9 10*3/uL (ref 4.0–10.5)

## 2015-11-04 LAB — LIPASE, BLOOD: LIPASE: 35 U/L (ref 11–51)

## 2015-11-04 LAB — BRAIN NATRIURETIC PEPTIDE: B NATRIURETIC PEPTIDE 5: 228.5 pg/mL — AB (ref 0.0–100.0)

## 2015-11-04 MED ORDER — SODIUM CHLORIDE 0.9 % IV BOLUS (SEPSIS)
500.0000 mL | Freq: Once | INTRAVENOUS | Status: AC
  Administered 2015-11-04: 500 mL via INTRAVENOUS

## 2015-11-04 NOTE — ED Notes (Signed)
Patient transported to CT 

## 2015-11-04 NOTE — ED Provider Notes (Signed)
Medical screening examination/treatment/procedure(s) were conducted as a shared visit with non-physician practitioner(s) and myself.  I personally evaluated the patient during the encounter.   EKG Interpretation   Date/Time:  Wednesday November 04 2015 15:25:56 EST Ventricular Rate:  74 PR Interval:  158 QRS Duration: 149 QT Interval:  439 QTC Calculation: 487 R Axis:   76 Text Interpretation:  Sinus rhythm Left bundle branch block No significant  change since last tracing Confirmed by Daran Favaro MD, Quillian Quince [phone]) on  11/04/2015 4:36:08 PM       See the written copy of this report in the patient's paper medical record.  These results did not interface directly into the electronic medical record and are summarized here.  81 yo F with chest pain. The started earlier today pain comes and goes last about 5 minutes at a time. Nothing seems to make this better or worse. Patient has had pain similar to this that feels like this is slightly different. Patient has been seen by cardiology for heart failure had a recent cath about a year ago that was unremarkable for coronary disease.  Symptoms are reproducible on palpation on exam. Patient also with some mild epigastric tenderness. Will obtain a laboratory evaluation. Hx not typical to coronary disease. Due to patient's age she will likely need a delta troponin. Delta negative, d/c home.   Deno Etienne, DO 11/05/15 [phone]

## 2015-11-04 NOTE — ED Provider Notes (Signed)
Patient signed out at end of shift by Shary Decamp, PA-C, after evaluation for chest pain. Known cardiac history. NTG, ASA without resolution. Negative stress this year. Non-acute EKG, 1st troponin negative. Pending delta trop. Plan: d/c home if negative with f/u PCP.  Troponin (delta) negative. The patient continues to be pain free. VSS. She is felt stable for discharge home with PCP follow up for recheck.   Charlann Lange, PA-C 11/04/15 2237  Lacretia Leigh, MD 11/05/15 [phone]

## 2015-11-04 NOTE — Discharge Instructions (Signed)
Please read and follow all provided instructions.  Your diagnoses today include:  1. Chest pain, unspecified chest pain type     Tests performed today include:  An EKG of your heart  A chest x-ray  Cardiac enzymes - a blood test for heart muscle damage  Blood counts and electrolytes  Vital signs. See below for your results today.   Medications prescribed:   Take any prescribed medications only as directed.  Follow-up instructions: Please follow-up with your primary care provider or Cardiologist in the next 48 hours for evaluation of your symptoms   Return instructions:  SEEK IMMEDIATE MEDICAL ATTENTION IF:  You have severe chest pain, especially if the pain is crushing or pressure-like and spreads to the arms, back, neck, or jaw, or if you have sweating, nausea (feeling sick to your stomach), or shortness of breath. THIS IS AN EMERGENCY. Don't wait to see if the pain will go away. Get medical help at once. Call 911 or 0 (operator). DO NOT drive yourself to the hospital.   Your chest pain gets worse and does not go away with rest.   You have an attack of chest pain lasting longer than usual, despite rest and treatment with the medications your caregiver has prescribed.   You wake from sleep with chest pain or shortness of breath.  You feel dizzy or faint.  You have chest pain not typical of your usual pain for which you originally saw your caregiver.   You have any other emergent concerns regarding your health.  Additional Information: Chest pain comes from many different causes. Your caregiver has diagnosed you as having chest pain that is not specific for one problem, but does not require admission.  You are at low risk for an acute heart condition or other serious illness.   Your vital signs today were: BP 119/52 mmHg   Pulse 70   Temp(Src) 98.8 F (37.1 C) (Oral)   Resp 18   SpO2 95% If your blood pressure (BP) was elevated above 135/85 this visit, please have this  repeated by your doctor within one month. --------------

## 2015-11-04 NOTE — ED Provider Notes (Signed)
CSN: [id number]     Arrival date & time 11/04/15  1514 History   First MD Initiated Contact with Patient 11/04/15 1515     Chief Complaint  Patient presents with  . Chest Pain   (Consider location/radiation/quality/duration/timing/severity/associated sxs/prior Treatment) HPI 81 y.o. female with a hx of HTN, Afib on anticoagulation, pacemaker for sinus node dysfunction, CHF, presents to the Emergency Department today complaining of chest pain x 3 hours ago. Reports that she was sitting in a car waiting for her friend in the bank when she felt some chest discomfort. She proceeded to go home and take a nitroglycerin with no relief. She contacted EMS and they gave her 3 more Nitroglycerin en route, IV Fluids, and 325 aspirin. Pain is coming and going every 5 minutes or so. Pain is 9/10 when the episodes occur and 2/10 when resolved. Describes the sensation as burning in her chest that does not radiate towards the back. No symptoms of nausea, vomiting or fever. No headache, changes in vision.    Stress Test Completed- 5/16. Negative Non Obstructive CAD based on Cath on 01/2014  Past Medical History  Diagnosis Date  . Dyslipidemia   . Hypertension   . Palpitations     PVCs/bigeminy on event in May 2009 revealing this was relatively asymptomatic  . Chest pain     a. 2011 Neg MV;  b. 01/2014 Cath: LM 20-30, LAD nl, D1 nl, LCX nl, OM1 nl, RCA dom 30-57m, PD/PL nl, EF 65%->Med Rx.  . Degenerative joint disease   . Hx of varicella   . History of skin cancer     tafeen/ non melanoma.   . Thrombocytopenia ([place])   . Anemia   . Monoclonal gammopathies   . Right lower quadrant abdominal pain 07/19/2012    Recurrent  With nausea    This is new since she had ct for llq pain in [facility]    R/o appendiceal problem  Hernia  Get ct scan  And plan  Fu     . Diastolic CHF, acute on chronic ([place]) 01/31/2012  . Atrial fibrillation ([place])     a. Dx 01/2014->Eliquis started.  . Cancer ([place])   . Pacemaker   .  Dysrhythmia   . Family history of adverse reaction to anesthesia     " multiple family members have difficulty waking "  . GERD (gastroesophageal reflux disease)   . Hypothyroidism    Past Surgical History  Procedure Laterality Date  . Cholecystectomy  1999  . Cesarean section      times 2  . Shoulder surgery  1996    Right   . Cataract extraction      Bilateral  implantt  . Cardioversion N/A 02/26/2014    Procedure: CARDIOVERSION AT BEDSIDE;  Surgeon: Pixie Casino, MD;  Location: [place];  Service: Cardiovascular;  Laterality: N/A;  . Cardioversion N/A 04/22/2014    Procedure: CARDIOVERSION (BEDSIDE);  Surgeon: [name], MD;  Location: [facility] OR;  Service: Cardiovascular;  Laterality: N/A;  . Pacemaker insertion  04/23/2014    STJ Assurity dual chamber pacemaker implanted by Dr Rayann Heman  . Insert / replace / remove pacemaker    . Esophagogastroduodenoscopy N/A 07/06/2014    Procedure: ESOPHAGOGASTRODUODENOSCOPY (EGD);  Surgeon: Ladene Artist, MD;  Location: [facility] ENDOSCOPY;  Service: Endoscopy;  Laterality: N/A;  . Left heart catheterization with coronary angiogram N/A 01/29/2014    Procedure: LEFT HEART CATHETERIZATION WITH CORONARY ANGIOGRAM;  Surgeon: Blane Ohara, MD;  Location: [facility] CATH LAB;  Service: Cardiovascular;  Laterality: N/A;  . Permanent pacemaker insertion N/A 04/23/2014    Procedure: PERMANENT PACEMAKER INSERTION;  Surgeon: Evans Lance, MD;  Location: [facility] CATH LAB;  Service: Cardiovascular;  Laterality: N/A;  . Right heart catheterization N/A 09/22/2014    Procedure: RIGHT HEART CATH;  Surgeon: Jolaine Artist, MD;  Location: [facility] CATH LAB;  Service: Cardiovascular;  Laterality: N/A;   Family History  Problem Relation Age of Onset  . Coronary artery disease Father   . Heart disease Father   . Lung cancer    . Alzheimer's disease Mother   . Cancer Brother 27    lung cancer  . Heart attack Father    Social History  Substance Use Topics  . Smoking status:  Never Smoker   . Smokeless tobacco: Never Used  . Alcohol Use: No   OB History    No data available     Review of Systems  Constitutional: Negative for fever, chills, diaphoresis and fatigue.  HENT: Negative for congestion, sinus pressure, sore throat and tinnitus.   Eyes: Negative for visual disturbance.  Respiratory: Negative for cough and shortness of breath.   Cardiovascular: Positive for chest pain.  Gastrointestinal: Negative for nausea, vomiting, abdominal pain, diarrhea and constipation.  Endocrine: Negative for cold intolerance and heat intolerance.  Musculoskeletal: Negative for back pain.  Skin: Negative for color change.  Neurological: Negative for dizziness, syncope, weakness, numbness and headaches.   Allergies  Other; Tramadol; and Ibuprofen  Home Medications   Prior to Admission medications   Medication Sig Start Date End Date Taking? Authorizing Provider  acetaminophen (TYLENOL) 325 MG tablet Take 2 tablets (650 mg total) by mouth every 4 (four) hours as needed for headache or mild pain. 09/26/14   Isaiah Serge, NP  amiodarone (PACERONE) 200 MG tablet Take 1 tablet (200 mg total) by mouth daily. Please schedule an appointment for more refills. 10/19/15   Imogene Burn, PA-C  apixaban (ELIQUIS) 2.5 MG TABS tablet Take 1 tablet (2.5 mg total) by mouth 2 (two) times daily. 08/03/15   Jolaine Artist, MD  aspirin 81 MG chewable tablet Chew 324 mg by mouth once.    Historical Provider, MD  erythromycin ophthalmic ointment Place 1 application into both eyes 2 (two) times daily. 05/19/15   Burnis Medin, MD  ferrous sulfate 325 (65 FE) MG tablet TAKE ONE TABLET BY MOUTH TWICE DAILY WITH A MEAL 06/02/15   Burnis Medin, MD  levothyroxine (SYNTHROID, LEVOTHROID) 75 MCG tablet TAKE ONE TABLET BY MOUTH ONCE DAILY 07/17/15   Burnis Medin, MD  loratadine (CLARITIN) 10 MG tablet Take 10 mg by mouth at bedtime as needed for allergies or rhinitis.     Historical Provider, MD   metolazone (ZAROXOLYN) 2.5 MG tablet Take 1 tablet (2.5 mg total) by mouth as needed (for weight 136 lb or greater). 12/16/14   Jolaine Artist, MD  nitroGLYCERIN (NITROSTAT) 0.4 MG SL tablet Place 1 tablet (0.4 mg total) under the tongue every 5 (five) minutes as needed. For chest pain 12/02/13   Fay Records, MD  oxyCODONE-acetaminophen (PERCOCET/ROXICET) 5-325 MG per tablet Take 0.5-1 tablets by mouth every 6 (six) hours as needed for severe pain. 07/31/15   Marin Olp, MD  pantoprazole (PROTONIX) 40 MG tablet TAKE ONE TABLET BY MOUTH ONCE DAILY 10/05/15   Burnis Medin, MD  potassium chloride SA (K-DUR,KLOR-CON) 20 MEQ tablet Take 60 mEq by  mouth daily.    Historical Provider, MD  simvastatin (ZOCOR) 10 MG tablet TAKE ONE TABLET BY MOUTH AT BEDTIME 10/19/15   Jolaine Artist, MD  torsemide (DEMADEX) 20 MG tablet Take 2 tablets (40 mg total) by mouth daily. 10/28/15   Jolaine Artist, MD   Temp(Src) 98.8 F (37.1 C) (Oral) Physical Exam  Constitutional: She is oriented to person, place, and time. She appears well-developed and well-nourished.  HENT:  Head: Normocephalic and atraumatic.  Eyes: Conjunctivae and EOM are normal. Pupils are equal, round, and reactive to light.  Neck: Neck supple.  Cardiovascular: Normal rate and regular rhythm.   Pulmonary/Chest: Effort normal and breath sounds normal.  Abdominal: Soft. There is tenderness in the epigastric area.  Neurological: She is alert and oriented to person, place, and time.  Skin: Skin is warm and dry.  Psychiatric: She has a normal mood and affect. Her behavior is normal. Thought content normal.  Nursing note and vitals reviewed.  ED Course  Procedures (including critical care time) Labs Review Labs Reviewed  BRAIN NATRIURETIC PEPTIDE - Abnormal; Notable for the following:    B Natriuretic Peptide 228.5 (*)    All other components within normal limits  CBC - Abnormal; Notable for the following:    RBC 3.85 (*)     Hemoglobin 11.9 (*)    Platelets 146 (*)    All other components within normal limits  COMPREHENSIVE METABOLIC PANEL - Abnormal; Notable for the following:    Glucose, Bld 115 (*)    BUN 21 (*)    Creatinine, Ser 1.76 (*)    Total Protein 6.1 (*)    Albumin 3.1 (*)    GFR calc non Af Amer 25 (*)    GFR calc Af Amer 29 (*)    All other components within normal limits  TROPONIN I  LIPASE, BLOOD  TROPONIN I   Imaging Review Dg Chest 2 View  11/04/2015  CLINICAL DATA:  Chest pain EXAM: CHEST  2 VIEW COMPARISON:  10/08/2015 FINDINGS: Cardiomediastinal silhouette is stable. Dual lead cardiac pacemaker with leads in right atrium and right ventricle is unchanged in position. Mild thoracic dextroscoliosis. Osteopenia and degenerative changes thoracic spine. Atherosclerotic calcifications of thoracic aorta. IMPRESSION: No active cardiopulmonary disease. Dual lead cardiac pacemaker is unchanged in position. Electronically Signed   By: Lahoma Crocker M.D.   On: 11/04/2015 15:55   I have personally reviewed and evaluated these images and lab results as part of my medical decision-making.   EKG Interpretation   Date/Time:  Wednesday November 04 2015 15:25:56 EST Ventricular Rate:  74 PR Interval:  158 QRS Duration: 149 QT Interval:  439 QTC Calculation: 487 R Axis:   76 Text Interpretation:  Sinus rhythm Left bundle branch block No significant  change since last tracing Confirmed by FLOYD MD, Quillian Quince [id number]) on  11/04/2015 4:36:08 PM      MDM  I have reviewed relevant laboratory values. I have reviewed relevant imaging studies. I have reviewed the relevant EKG. I have reviewed the relevant previous healthcare records. I have reviewed EMS Documentation. I obtained HPI from historian. Cases discussed with Attending Physician  ED Course: CBC, CMP, BNP, Trop, EKG, CXR 5:10 PM- troponin Neg will redraw at 19:30 6:18 PM- Spoke with Pt. And she is not exhibiting any symptoms of chest pain at this  time Repeat EKG- Unremarkable   Assessment: 89yF with pmh afib, chf. Previous stress test on 03/2015 was negative. LV EF 73%. Cath in  01/2014 shows nonobstructive CAD. Will delta Troponin and check Lipase due to epigastric  tenderness.     Cardiac workup is thus far unremarkable. Her troponins are both negative and EKG is unchanged on repeat. Multiple workups in the chest pain in the past. Recent cath in 2015 and a Myoview in 2016. I feel confident based on her presentation, physical exam, and prior workup that this is not cardiac and strongly favor a chest wall etiology. She will be discharged to home with instructions to take Tylenol or ibuprofen as needed for pain. She is to return as needed for any problems.  Disposition/Plan:  Will d/c home after repeat Troponin  Additional Verbal discharge instructions given and discussed with patient.  Pt Instructed to f/u with PCP/Cardiologist in the next 48 hours for evaluation and treatment of symptoms.  Sign Out to: Charlann Lange, PA-C   Patient was discussed with Deno Etienne, DO   Final diagnoses:  Chest pain, unspecified chest pain type      Shary Decamp, PA-C 11/04/15 [place], DO 11/05/15 1612

## 2015-11-04 NOTE — ED Notes (Signed)
Patient transported to X-ray 

## 2015-11-04 NOTE — ED Notes (Signed)
Pt arrives from home via [facility]. Pt has c/o centralized cp (burning) that is intermittent in nature. No other complaints at this time. Pt states she has family drama currently that is causing her stress.

## 2015-11-10 ENCOUNTER — Other Ambulatory Visit: Payer: Self-pay | Admitting: Internal Medicine

## 2015-11-14 ENCOUNTER — Other Ambulatory Visit: Payer: Self-pay | Admitting: Physician Assistant

## 2015-12-03 ENCOUNTER — Other Ambulatory Visit: Payer: Self-pay | Admitting: Internal Medicine

## 2015-12-04 ENCOUNTER — Ambulatory Visit (INDEPENDENT_AMBULATORY_CARE_PROVIDER_SITE_OTHER): Payer: Medicare Other | Admitting: *Deleted

## 2015-12-04 DIAGNOSIS — Z23 Encounter for immunization: Secondary | ICD-10-CM

## 2015-12-16 DIAGNOSIS — I4891 Unspecified atrial fibrillation: Secondary | ICD-10-CM

## 2015-12-16 DIAGNOSIS — M6281 Muscle weakness (generalized): Secondary | ICD-10-CM | POA: Diagnosis not present

## 2015-12-16 DIAGNOSIS — E785 Hyperlipidemia, unspecified: Secondary | ICD-10-CM

## 2015-12-16 DIAGNOSIS — I130 Hypertensive heart and chronic kidney disease with heart failure and stage 1 through stage 4 chronic kidney disease, or unspecified chronic kidney disease: Secondary | ICD-10-CM | POA: Diagnosis not present

## 2015-12-16 DIAGNOSIS — I5042 Chronic combined systolic (congestive) and diastolic (congestive) heart failure: Secondary | ICD-10-CM | POA: Diagnosis not present

## 2015-12-16 DIAGNOSIS — N183 Chronic kidney disease, stage 3 (moderate): Secondary | ICD-10-CM | POA: Diagnosis not present

## 2015-12-31 ENCOUNTER — Other Ambulatory Visit (HOSPITAL_COMMUNITY): Payer: Self-pay | Admitting: Internal Medicine

## 2016-01-05 ENCOUNTER — Telehealth (HOSPITAL_COMMUNITY): Payer: Self-pay

## 2016-01-05 NOTE — Telephone Encounter (Signed)
Medical records from CHF clinic for 2016 mailed to [facility] at [address] [place], [place]

## 2016-01-20 ENCOUNTER — Emergency Department (HOSPITAL_COMMUNITY): Payer: Medicare Other

## 2016-01-20 ENCOUNTER — Observation Stay (HOSPITAL_COMMUNITY)
Admission: EM | Admit: 2016-01-20 | Discharge: 2016-01-22 | Disposition: A | Discharge status: Home / Self Care | Payer: Medicare Other | Attending: Internal Medicine | Admitting: Internal Medicine

## 2016-01-20 ENCOUNTER — Encounter (HOSPITAL_COMMUNITY): Payer: Self-pay

## 2016-01-20 ENCOUNTER — Ambulatory Visit (INDEPENDENT_AMBULATORY_CARE_PROVIDER_SITE_OTHER): Payer: Medicare Other | Admitting: *Deleted

## 2016-01-20 DIAGNOSIS — K219 Gastro-esophageal reflux disease without esophagitis: Secondary | ICD-10-CM | POA: Diagnosis not present

## 2016-01-20 DIAGNOSIS — D696 Thrombocytopenia, unspecified: Secondary | ICD-10-CM | POA: Diagnosis not present

## 2016-01-20 DIAGNOSIS — N184 Chronic kidney disease, stage 4 (severe): Secondary | ICD-10-CM | POA: Diagnosis present

## 2016-01-20 DIAGNOSIS — R001 Bradycardia, unspecified: Secondary | ICD-10-CM

## 2016-01-20 DIAGNOSIS — R1310 Dysphagia, unspecified: Secondary | ICD-10-CM

## 2016-01-20 DIAGNOSIS — I5033 Acute on chronic diastolic (congestive) heart failure: Secondary | ICD-10-CM | POA: Diagnosis not present

## 2016-01-20 DIAGNOSIS — Z950 Presence of cardiac pacemaker: Secondary | ICD-10-CM | POA: Insufficient documentation

## 2016-01-20 DIAGNOSIS — Z7982 Long term (current) use of aspirin: Secondary | ICD-10-CM | POA: Insufficient documentation

## 2016-01-20 DIAGNOSIS — I4891 Unspecified atrial fibrillation: Secondary | ICD-10-CM | POA: Diagnosis not present

## 2016-01-20 DIAGNOSIS — R0789 Other chest pain: Secondary | ICD-10-CM | POA: Diagnosis not present

## 2016-01-20 DIAGNOSIS — Z85828 Personal history of other malignant neoplasm of skin: Secondary | ICD-10-CM | POA: Insufficient documentation

## 2016-01-20 DIAGNOSIS — D649 Anemia, unspecified: Secondary | ICD-10-CM | POA: Diagnosis not present

## 2016-01-20 DIAGNOSIS — E785 Hyperlipidemia, unspecified: Secondary | ICD-10-CM | POA: Insufficient documentation

## 2016-01-20 DIAGNOSIS — R079 Chest pain, unspecified: Secondary | ICD-10-CM | POA: Diagnosis present

## 2016-01-20 DIAGNOSIS — I499 Cardiac arrhythmia, unspecified: Secondary | ICD-10-CM | POA: Diagnosis not present

## 2016-01-20 DIAGNOSIS — M1990 Unspecified osteoarthritis, unspecified site: Secondary | ICD-10-CM | POA: Insufficient documentation

## 2016-01-20 DIAGNOSIS — Z8619 Personal history of other infectious and parasitic diseases: Secondary | ICD-10-CM | POA: Insufficient documentation

## 2016-01-20 DIAGNOSIS — E039 Hypothyroidism, unspecified: Secondary | ICD-10-CM | POA: Diagnosis not present

## 2016-01-20 DIAGNOSIS — R011 Cardiac murmur, unspecified: Secondary | ICD-10-CM | POA: Insufficient documentation

## 2016-01-20 DIAGNOSIS — Z79899 Other long term (current) drug therapy: Secondary | ICD-10-CM | POA: Insufficient documentation

## 2016-01-20 DIAGNOSIS — I10 Essential (primary) hypertension: Secondary | ICD-10-CM | POA: Insufficient documentation

## 2016-01-20 LAB — CBC
HCT: 39.1 % (ref 36.0–46.0)
Hemoglobin: 12.6 g/dL (ref 12.0–15.0)
MCH: 30.6 pg (ref 26.0–34.0)
MCHC: 32.2 g/dL (ref 30.0–36.0)
MCV: 94.9 fL (ref 78.0–100.0)
Platelets: 125 10*3/uL — ABNORMAL LOW (ref 150–400)
RBC: 4.12 MIL/uL (ref 3.87–5.11)
RDW: 14.4 % (ref 11.5–15.5)
WBC: 5.2 10*3/uL (ref 4.0–10.5)

## 2016-01-20 LAB — BASIC METABOLIC PANEL
ANION GAP: 11 (ref 5–15)
BUN: 39 mg/dL — AB (ref 6–20)
CALCIUM: 8.9 mg/dL (ref 8.9–10.3)
CO2: 25 mmol/L (ref 22–32)
CREATININE: 2 mg/dL — AB (ref 0.44–1.00)
Chloride: 107 mmol/L (ref 101–111)
GFR calc Af Amer: 24 mL/min — ABNORMAL LOW (ref >60)
GFR calc non Af Amer: 21 mL/min — ABNORMAL LOW (ref >60)
GLUCOSE: 120 mg/dL — AB (ref 65–99)
Potassium: 4 mmol/L (ref 3.5–5.1)
Sodium: 143 mmol/L (ref 135–145)

## 2016-01-20 LAB — I-STAT TROPONIN, ED: Troponin i, poc: 0.04 ng/mL (ref 0.00–0.08)

## 2016-01-20 NOTE — ED Notes (Signed)
PER EMS: pt from home with reports of non-radiating "stinging" substernal CP ongoing for 2 days. Hx of afib and has pacemaker. Denies N/V/D/SOB. EMS adm 2 nitro SL with minimal relief. Her initial BP was [id number] systolic but her BP dropped to 63 systolic after second nitro. EMS adm about 100 cc of NS and BP increased to 93/42. Pt A&OX4. 324 given PTA.

## 2016-01-20 NOTE — ED Provider Notes (Signed)
CSN: [id number]     Arrival date & time 01/20/16  1935 History   First MD Initiated Contact with Patient 01/20/16 1953     Chief Complaint  Patient presents with  . Chest Pain     (Consider location/radiation/quality/duration/timing/severity/associated sxs/prior Treatment) HPI  82 year old female presents with chest pain. Started yesterday. Pain has been coming going, last about 10 minutes at a time. Feels like a light pressure but also feels like ice and a cold sensation inside of her chest. No associated shortness of breath with some that she feels like she needs to take a big breath. No pleuritic pain. No leg swelling or leg pain. No associated sweating, nausea, or vomiting. Has had pain similar to this multiple prior times with states the cold feeling in her chest is new. Currently is pain-free. She was given nitroglycerin by EMS but this at the time did not affect her pain. She did drop her blood pressure into the 60s. Was given 100 mL of normal saline. Pain is not associated with anything in particular.  Past Medical History  Diagnosis Date  . Dyslipidemia   . Hypertension   . Palpitations     PVCs/bigeminy on event in May 2009 revealing this was relatively asymptomatic  . Chest pain     a. 2011 Neg MV;  b. 01/2014 Cath: LM 20-30, LAD nl, D1 nl, LCX nl, OM1 nl, RCA dom 30-28m, PD/PL nl, EF 65%->Med Rx.  . Degenerative joint disease   . Hx of varicella   . History of skin cancer     tafeen/ non melanoma.   . Thrombocytopenia ([place])   . Anemia   . Monoclonal gammopathies   . Right lower quadrant abdominal pain 07/19/2012    Recurrent  With nausea    This is new since she had ct for llq pain in [facility]    R/o appendiceal problem  Hernia  Get ct scan  And plan  Fu     . Diastolic CHF, acute on chronic ([place]) 01/31/2012  . Atrial fibrillation ([place])     a. Dx 01/2014->Eliquis started.  . Cancer ([place])   . Pacemaker   . Dysrhythmia   . Family history of adverse reaction to anesthesia     "  multiple family members have difficulty waking "  . GERD (gastroesophageal reflux disease)   . Hypothyroidism    Past Surgical History  Procedure Laterality Date  . Cholecystectomy  1999  . Cesarean section      times 2  . Shoulder surgery  1996    Right   . Cataract extraction      Bilateral  implantt  . Cardioversion N/A 02/26/2014    Procedure: CARDIOVERSION AT BEDSIDE;  Surgeon: Pixie Casino, MD;  Location: [place];  Service: Cardiovascular;  Laterality: N/A;  . Cardioversion N/A 04/22/2014    Procedure: CARDIOVERSION (BEDSIDE);  Surgeon: [name], MD;  Location: [facility] OR;  Service: Cardiovascular;  Laterality: N/A;  . Pacemaker insertion  04/23/2014    STJ Assurity dual chamber pacemaker implanted by Dr Rayann Heman  . Insert / replace / remove pacemaker    . Esophagogastroduodenoscopy N/A 07/06/2014    Procedure: ESOPHAGOGASTRODUODENOSCOPY (EGD);  Surgeon: Ladene Artist, MD;  Location: [facility] ENDOSCOPY;  Service: Endoscopy;  Laterality: N/A;  . Left heart catheterization with coronary angiogram N/A 01/29/2014    Procedure: LEFT HEART CATHETERIZATION WITH CORONARY ANGIOGRAM;  Surgeon: Blane Ohara, MD;  Location: [facility] CATH LAB;  Service: Cardiovascular;  Laterality:  N/A;  . Permanent pacemaker insertion N/A 04/23/2014    Procedure: PERMANENT PACEMAKER INSERTION;  Surgeon: Evans Lance, MD;  Location: [facility] CATH LAB;  Service: Cardiovascular;  Laterality: N/A;  . Right heart catheterization N/A 09/22/2014    Procedure: RIGHT HEART CATH;  Surgeon: Jolaine Artist, MD;  Location: [facility] CATH LAB;  Service: Cardiovascular;  Laterality: N/A;   Family History  Problem Relation Age of Onset  . Coronary artery disease Father   . Heart disease Father   . Lung cancer    . Alzheimer's disease Mother   . Cancer Brother 77    lung cancer  . Heart attack Father    Social History  Substance Use Topics  . Smoking status: Never Smoker   . Smokeless tobacco: Never Used  . Alcohol Use: No   OB  History    No data available     Review of Systems  Constitutional: Negative for fever and diaphoresis.  Respiratory: Negative for cough and shortness of breath.   Cardiovascular: Positive for chest pain. Negative for leg swelling.  Gastrointestinal: Negative for nausea, vomiting and abdominal pain.  Musculoskeletal: Negative for back pain and neck pain.  All other systems reviewed and are negative.     Allergies  Other; Tramadol; and Ibuprofen  Home Medications   Prior to Admission medications   Medication Sig Start Date End Date Taking? Authorizing Provider  acetaminophen (TYLENOL) 325 MG tablet Take 2 tablets (650 mg total) by mouth every 4 (four) hours as needed for headache or mild pain. 09/26/14   Isaiah Serge, NP  amiodarone (PACERONE) 200 MG tablet TAKE ONE TABLET BY MOUTH ONCE DAILY 11/17/15   Evans Lance, MD  apixaban (ELIQUIS) 2.5 MG TABS tablet Take 1 tablet (2.5 mg total) by mouth 2 (two) times daily. 08/03/15   Jolaine Artist, MD  aspirin 81 MG chewable tablet Chew 324 mg by mouth once.    Historical Provider, MD  erythromycin ophthalmic ointment Place 1 application into both eyes 2 (two) times daily. 05/19/15   Burnis Medin, MD  ferrous sulfate 325 (65 FE) MG tablet TAKE ONE TABLET BY MOUTH TWICE DAILY WITH A MEAL 12/04/15   Burnis Medin, MD  KLOR-CON M20 20 MEQ tablet TAKE THREE TABLETS BY MOUTH ONCE DAILY 01/05/16   Jolaine Artist, MD  levothyroxine (SYNTHROID, LEVOTHROID) 75 MCG tablet TAKE ONE TABLET BY MOUTH ONCE DAILY 11/11/15   Burnis Medin, MD  loratadine (CLARITIN) 10 MG tablet Take 10 mg by mouth at bedtime as needed for allergies or rhinitis.     Historical Provider, MD  metolazone (ZAROXOLYN) 2.5 MG tablet Take 1 tablet (2.5 mg total) by mouth as needed (for weight 136 lb or greater). 12/16/14   Jolaine Artist, MD  nitroGLYCERIN (NITROSTAT) 0.4 MG SL tablet Place 1 tablet (0.4 mg total) under the tongue every 5 (five) minutes as needed. For  chest pain 12/02/13   Fay Records, MD  oxyCODONE-acetaminophen (PERCOCET/ROXICET) 5-325 MG per tablet Take 0.5-1 tablets by mouth every 6 (six) hours as needed for severe pain. 07/31/15   Marin Olp, MD  pantoprazole (PROTONIX) 40 MG tablet TAKE ONE TABLET BY MOUTH ONCE DAILY 10/05/15   Burnis Medin, MD  potassium chloride SA (K-DUR,KLOR-CON) 20 MEQ tablet Take 60 mEq by mouth daily.    Historical Provider, MD  simvastatin (ZOCOR) 10 MG tablet TAKE ONE TABLET BY MOUTH AT BEDTIME 10/19/15   Jolaine Artist, MD  torsemide (DEMADEX) 20 MG tablet Take 2 tablets (40 mg total) by mouth daily. 10/28/15   Shaune Pascal Bensimhon, MD   BP 116/60 mmHg  Pulse 73  Temp(Src) 98.3 F (36.8 C) (Oral)  Resp 18  SpO2 96% Physical Exam  Constitutional: She is oriented to person, place, and time. She appears well-developed and well-nourished.  HENT:  Head: Normocephalic and atraumatic.  Right Ear: External ear normal.  Left Ear: External ear normal.  Nose: Nose normal.  Eyes: Right eye exhibits no discharge. Left eye exhibits no discharge.  Cardiovascular: Normal rate and regular rhythm.   Murmur heard. Pulses:      Radial pulses are 2+ on the right side, and 2+ on the left side.  Pulmonary/Chest: Effort normal and breath sounds normal. She exhibits no tenderness.  Abdominal: Soft. There is no tenderness.  Musculoskeletal: She exhibits no edema.  Neurological: She is alert and oriented to person, place, and time.  Skin: Skin is warm and dry.  Nursing note and vitals reviewed.   ED Course  Procedures (including critical care time) Labs Review Labs Reviewed  BASIC METABOLIC PANEL - Abnormal; Notable for the following:    Glucose, Bld 120 (*)    BUN 39 (*)    Creatinine, Ser 2.00 (*)    GFR calc non Af Amer 21 (*)    GFR calc Af Amer 24 (*)    All other components within normal limits  CBC - Abnormal; Notable for the following:    Platelets 125 (*)    All other components within normal  limits  I-STAT TROPOININ, ED  Randolm Idol, ED    Imaging Review Dg Chest 2 View  01/20/2016  CLINICAL DATA:  Chest discomfort for 1 day.  Initial encounter. EXAM: CHEST  2 VIEW COMPARISON:  PA and lateral chest 11/04/2015 10/08/2015. FINDINGS: Minimal atelectasis or scar is seen in the left lung base. The lungs are otherwise clear. Heart size is normal. No pneumothorax or pleural effusion. Pacing device is noted. Postoperative change right shoulder also seen. Convex left scoliosis is identified. IMPRESSION: No acute disease. Electronically Signed   By: Inge Rise M.D.   On: 01/20/2016 20:26   I have personally reviewed and evaluated these images and lab results as part of my medical decision-making.   EKG Interpretation   Date/Time:  Wednesday January 20 2016 19:52:48 EST Ventricular Rate:  70 PR Interval:  189 QRS Duration: 154 QT Interval:  477 QTC Calculation: 515 R Axis:   122 Text Interpretation:  Sinus rhythm Anterolateral infarct, age  indeterminate Prolonged QT interval Baseline wander in lead(s) II III aVF  no significant change since Dec 2016 Confirmed by Aristotle Lieb  MD, [place]  ([id number]) on 01/20/2016 8:02:28 PM      MDM   Final diagnoses:  Atypical chest pain    Patient's pain is atypical but has now come back. Unable to give nitro given low BPs (dropped significantly with EMS). Likely not cardiac but given age, risk factors and continued pain will observe in hospital overnight with hospitalist. Talked initially about going home given non-obstructive disease on 2015 cath but patient lives at home alone and is still having pain. Will try fentanyl after discussion with Dr. Olevia Bowens, who will admit.    Sherwood Gambler, MD 01/21/16 [phone]

## 2016-01-21 ENCOUNTER — Encounter (HOSPITAL_COMMUNITY): Payer: Self-pay | Admitting: Internal Medicine

## 2016-01-21 DIAGNOSIS — R0789 Other chest pain: Secondary | ICD-10-CM | POA: Diagnosis not present

## 2016-01-21 DIAGNOSIS — I5022 Chronic systolic (congestive) heart failure: Secondary | ICD-10-CM | POA: Diagnosis not present

## 2016-01-21 LAB — COMPREHENSIVE METABOLIC PANEL
ALT: 17 U/L (ref 14–54)
ANION GAP: 9 (ref 5–15)
AST: 18 U/L (ref 15–41)
Albumin: 3.2 g/dL — ABNORMAL LOW (ref 3.5–5.0)
Alkaline Phosphatase: 56 U/L (ref 38–126)
BUN: 38 mg/dL — ABNORMAL HIGH (ref 6–20)
CHLORIDE: 108 mmol/L (ref 101–111)
CO2: 27 mmol/L (ref 22–32)
CREATININE: 1.88 mg/dL — AB (ref 0.44–1.00)
Calcium: 8.8 mg/dL — ABNORMAL LOW (ref 8.9–10.3)
GFR, EST AFRICAN AMERICAN: 26 mL/min — AB (ref >60)
GFR, EST NON AFRICAN AMERICAN: 23 mL/min — AB (ref >60)
Glucose, Bld: 103 mg/dL — ABNORMAL HIGH (ref 65–99)
Potassium: 4.6 mmol/L (ref 3.5–5.1)
SODIUM: 144 mmol/L (ref 135–145)
TOTAL PROTEIN: 6.1 g/dL — AB (ref 6.5–8.1)
Total Bilirubin: 0.7 mg/dL (ref 0.3–1.2)

## 2016-01-21 LAB — CBC
HEMATOCRIT: 38.4 % (ref 36.0–46.0)
Hemoglobin: 12.5 g/dL (ref 12.0–15.0)
MCH: 30.9 pg (ref 26.0–34.0)
MCHC: 32.6 g/dL (ref 30.0–36.0)
MCV: 94.8 fL (ref 78.0–100.0)
Platelets: 116 10*3/uL — ABNORMAL LOW (ref 150–400)
RBC: 4.05 MIL/uL (ref 3.87–5.11)
RDW: 14.7 % (ref 11.5–15.5)
WBC: 4.6 10*3/uL (ref 4.0–10.5)

## 2016-01-21 LAB — PHOSPHORUS: Phosphorus: 4 mg/dL (ref 2.5–4.6)

## 2016-01-21 LAB — TROPONIN I
TROPONIN I: 0.03 ng/mL (ref <0.031)
TROPONIN I: 0.05 ng/mL — AB (ref <0.031)
TROPONIN I: 0.05 ng/mL — AB (ref <0.031)

## 2016-01-21 LAB — BRAIN NATRIURETIC PEPTIDE: B NATRIURETIC PEPTIDE 5: 261.5 pg/mL — AB (ref 0.0–100.0)

## 2016-01-21 LAB — I-STAT TROPONIN, ED: TROPONIN I, POC: 0.03 ng/mL (ref 0.00–0.08)

## 2016-01-21 LAB — MAGNESIUM: Magnesium: 2.3 mg/dL (ref 1.7–2.4)

## 2016-01-21 LAB — LIPASE, BLOOD: LIPASE: 33 U/L (ref 11–51)

## 2016-01-21 MED ORDER — ONDANSETRON HCL 4 MG/2ML IJ SOLN: 4.0000 mg | Freq: Four times a day (QID) | INTRAMUSCULAR | Status: DC | PRN

## 2016-01-21 MED ORDER — SIMVASTATIN 10 MG PO TABS
10.0000 mg | ORAL_TABLET | Freq: Every day | ORAL | Status: DC
  Administered 2016-01-21: 10 mg via ORAL
  Filled 2016-01-21: qty 1

## 2016-01-21 MED ORDER — ALUM & MAG HYDROXIDE-SIMETH 200-200-20 MG/5ML PO SUSP
30.0000 mL | ORAL | Status: DC | PRN
  Administered 2016-01-21: 30 mL via ORAL
  Filled 2016-01-21: qty 30

## 2016-01-21 MED ORDER — LORATADINE 10 MG PO TABS
10.0000 mg | ORAL_TABLET | Freq: Every evening | ORAL | Status: DC | PRN
  Administered 2016-01-21: 10 mg via ORAL
  Filled 2016-01-21: qty 1

## 2016-01-21 MED ORDER — FENTANYL CITRATE (PF) 100 MCG/2ML IJ SOLN
25.0000 ug | Freq: Once | INTRAMUSCULAR | Status: AC
Start: 2016-01-21 — End: 2016-01-21
  Administered 2016-01-21: 25 ug via INTRAVENOUS
  Filled 2016-01-21: qty 2

## 2016-01-21 MED ORDER — SODIUM CHLORIDE 0.9% FLUSH
3.0000 mL | Freq: Two times a day (BID) | INTRAVENOUS | Status: DC
  Administered 2016-01-21 – 2016-01-22 (×3): 3 mL via INTRAVENOUS

## 2016-01-21 MED ORDER — FERROUS SULFATE 325 (65 FE) MG PO TABS
325.0000 mg | ORAL_TABLET | Freq: Two times a day (BID) | ORAL | Status: DC
  Administered 2016-01-21 – 2016-01-22 (×3): 325 mg via ORAL
  Filled 2016-01-21 (×3): qty 1

## 2016-01-21 MED ORDER — ACETAMINOPHEN 325 MG PO TABS
650.0000 mg | ORAL_TABLET | ORAL | Status: DC | PRN
  Administered 2016-01-21: 650 mg via ORAL
  Filled 2016-01-21: qty 2

## 2016-01-21 MED ORDER — PANTOPRAZOLE SODIUM 40 MG PO TBEC
40.0000 mg | DELAYED_RELEASE_TABLET | Freq: Two times a day (BID) | ORAL | Status: DC
  Administered 2016-01-21 – 2016-01-22 (×3): 40 mg via ORAL
  Filled 2016-01-21 (×3): qty 1

## 2016-01-21 MED ORDER — LEVOTHYROXINE SODIUM 75 MCG PO TABS
75.0000 ug | ORAL_TABLET | Freq: Every day | ORAL | Status: DC
Start: 2016-01-21 — End: 2016-01-22
  Administered 2016-01-22: 75 ug via ORAL
  Filled 2016-01-21 (×2): qty 1

## 2016-01-21 MED ORDER — ALUM & MAG HYDROXIDE-SIMETH 200-200-20 MG/5ML PO SUSP: 30.0000 mL | ORAL | Status: DC | PRN

## 2016-01-21 MED ORDER — SODIUM CHLORIDE 0.9 % IV BOLUS (SEPSIS)
250.0000 mL | Freq: Once | INTRAVENOUS | Status: AC
  Administered 2016-01-21: 250 mL via INTRAVENOUS

## 2016-01-21 MED ORDER — APIXABAN 2.5 MG PO TABS
2.5000 mg | ORAL_TABLET | Freq: Two times a day (BID) | ORAL | Status: DC
  Administered 2016-01-21 – 2016-01-22 (×3): 2.5 mg via ORAL
  Filled 2016-01-21 (×3): qty 1

## 2016-01-21 MED ORDER — AMIODARONE HCL 200 MG PO TABS
200.0000 mg | ORAL_TABLET | Freq: Every day | ORAL | Status: DC
  Administered 2016-01-21 – 2016-01-22 (×2): 200 mg via ORAL
  Filled 2016-01-21 (×2): qty 1

## 2016-01-21 MED ORDER — ONDANSETRON HCL 4 MG PO TABS: 4.0000 mg | ORAL_TABLET | Freq: Four times a day (QID) | ORAL | Status: DC | PRN

## 2016-01-21 MED ORDER — PANTOPRAZOLE SODIUM 40 MG PO TBEC: 40.0000 mg | DELAYED_RELEASE_TABLET | Freq: Every day | ORAL | Status: DC

## 2016-01-21 MED ORDER — POTASSIUM CHLORIDE CRYS ER 20 MEQ PO TBCR
60.0000 meq | EXTENDED_RELEASE_TABLET | Freq: Every day | ORAL | Status: DC
  Administered 2016-01-21 – 2016-01-22 (×2): 60 meq via ORAL
  Filled 2016-01-21 (×2): qty 3

## 2016-01-21 NOTE — ED Notes (Signed)
Dr. Allyson Sabal notified that pt is now reporting central CP. EKG captured and exported.

## 2016-01-21 NOTE — ED Notes (Signed)
Pt placed on 2L [place] due to SpO2 being 89% while sleeping. SpO2 improved to 95%.

## 2016-01-21 NOTE — Progress Notes (Signed)
Patient admitted this morning, seen and examined  82 y.o. female with the history of very mild nonobstructive CAD by cardiac catheterization in 01/2014, persistent atrial fibrillation with tachy-brady s/p PMM, diastolic CHF, HTN, HL, anemia, and CKD.   She has had several admits over past year for CP. Cath 3/15 minimal CAD. (Mid LM 20-30%, mid RCA 30-40%, EF 65%).EMS reported that she was initially [id number] mmHg systolic and subsequently dropped to 63 mmHg post second sublingual nitroglycerin. Weight stable at home BNP 228->261. CXR ok. Troponin negative x 2. ECG with LBBB. No change. Currently chest pain-free  Plan Chest pain atypical-suspect that is more esophageal in origin, patient is on Eliquis and aspirin Slight elevation in troponin 0.05, patient seen by cardiology, 2-D echo pending, this is okay no further cardiac workup needed  Paroxysmal atrial fibrillation status post pacemaker placement, currently normal sinus rhythm on amiodarone.  Chronic kidney disease stage III, baseline around 2.0, creatinine 2 to baseline today  Gastroesophageal reflux-continue PPI, can check  esophagogram

## 2016-01-21 NOTE — Consult Note (Signed)
CARDIOLOGY CONSULT NOTE  Referring Physician: Allyson Sabal Reason for Consultation: CP   HPI:  [name] is a 82 y.o. female with the history of very mild  nonobstructive CAD by cardiac catheterization in 01/2014, persistent atrial fibrillation with tachy-brady s/p PMM, diastolic CHF, HTN, HL, anemia, and CKD.   She has had several admits over past year for CP. Cath 3/15 minimal CAD. (Mid LM 20-30%, mid RCA 30-40%, EF 65%)  Myoview 5/16: NomrmalEF and normal perfusion.   Reported to ED last night with 2 days of intermittent stinging and coldness. No associated symptoms. Not associated with exertion. Finally got worse yesterday so called 911. No change with position or eating. She was given nitroglycerin by EMS, which did not change the pain symptomatology, but decreased her blood pressure significantly. EMS reported that she was initially [id number] mmHg systolic and subsequently dropped to 63 mmHg post second sublingual nitroglycerin. Weight stable at home BNP 228->261. CXR ok. Troponin negative x 2. ECG with LBBB. No change  Review of Systems:     Cardiac Review of Systems: {Y] = yes [ ]  = no  Chest Pain [ y   ]  Resting SOB [   ] Exertional SOB  [ y ]  Orthopnea [  ]   Pedal Edema [   ]    Palpitations Blue.Reese  ] Syncope  [  ]   Presyncope [   ]  General Review of Systems: [Y] = yes [  ]=no Constitional: recent weight change [  ]; anorexia [  ]; fatigue [  ]; nausea [  ]; night sweats [  ]; fever [  ]; or chills [  ];                                                                     Eyes : blurred vision [  ]; diplopia [   ]; vision changes [  ];  Amaurosis fugax[  ]; Resp: cough [  ];  wheezing[  ];  hemoptysis[  ];  PND [  ];  GI:  gallstones[  ], vomiting[  ];  dysphagia[  ]; melena[  ];  hematochezia [  ]; heartburn[  ];   GU: kidney stones [  ]; hematuria[  ];   dysuria [  ];  nocturia[  ]; incontinence [  ];             Skin: rash, swelling[  ];, hair loss[  ];  peripheral edema[  ];  or  itching[  ]; Musculosketetal: myalgias[  ];  joint swelling[  ];  joint erythema[  ];  joint pain[y  ];  back pain[  ];  Heme/Lymph: bruising[  ];  bleeding[  ];  anemia[  ];  Neuro: TIA[  ];  headaches[  ];  stroke[  ];  vertigo[  ];  seizures[  ];   paresthesias[  ];  difficulty walking[  ];  Psych:depression[  ]; anxiety[  ];  Endocrine: diabetes[  ];  thyroid dysfunction[  ];  Other:  Past Medical History  Diagnosis Date  . Dyslipidemia   . Hypertension   . Palpitations     PVCs/bigeminy on event in May 2009 revealing this was relatively asymptomatic  . Chest  pain     a. 2011 Neg MV;  b. 01/2014 Cath: LM 20-30, LAD nl, D1 nl, LCX nl, OM1 nl, RCA dom 30-67m, PD/PL nl, EF 65%->Med Rx.  . Degenerative joint disease   . Hx of varicella   . History of skin cancer     tafeen/ non melanoma.   . Thrombocytopenia ([place])   . Anemia   . Monoclonal gammopathies   . Right lower quadrant abdominal pain 07/19/2012    Recurrent  With nausea    This is new since she had ct for llq pain in [facility]    R/o appendiceal problem  Hernia  Get ct scan  And plan  Fu     . Diastolic CHF, acute on chronic ([place]) 01/31/2012  . Atrial fibrillation ([place])     a. Dx 01/2014->Eliquis started.  . Cancer ([place])   . Pacemaker   . Dysrhythmia   . Family history of adverse reaction to anesthesia     " multiple family members have difficulty waking "  . GERD (gastroesophageal reflux disease)   . Hypothyroidism     Medications Prior to Admission  Medication Sig Dispense Refill  . acetaminophen (TYLENOL) 325 MG tablet Take 2 tablets (650 mg total) by mouth every 4 (four) hours as needed for headache or mild pain.    Marland Kitchen amiodarone (PACERONE) 200 MG tablet TAKE ONE TABLET BY MOUTH ONCE DAILY 30 tablet 5  . apixaban (ELIQUIS) 2.5 MG TABS tablet Take 1 tablet (2.5 mg total) by mouth 2 (two) times daily. 60 tablet 6  . ferrous sulfate 325 (65 FE) MG tablet TAKE ONE TABLET BY MOUTH TWICE DAILY WITH A MEAL 180 tablet 0  .  levothyroxine (SYNTHROID, LEVOTHROID) 75 MCG tablet TAKE ONE TABLET BY MOUTH ONCE DAILY 90 tablet 0  . loratadine (CLARITIN) 10 MG tablet Take 10 mg by mouth at bedtime as needed for allergies or rhinitis.     Marland Kitchen nitroGLYCERIN (NITROSTAT) 0.4 MG SL tablet Place 1 tablet (0.4 mg total) under the tongue every 5 (five) minutes as needed. For chest pain 25 tablet 6  . oxyCODONE-acetaminophen (PERCOCET/ROXICET) 5-325 MG per tablet Take 0.5-1 tablets by mouth every 6 (six) hours as needed for severe pain. 5 tablet 0  . pantoprazole (PROTONIX) 40 MG tablet TAKE ONE TABLET BY MOUTH ONCE DAILY 30 tablet 5  . potassium chloride SA (K-DUR,KLOR-CON) 20 MEQ tablet Take 60 mEq by mouth daily.    . simvastatin (ZOCOR) 10 MG tablet TAKE ONE TABLET BY MOUTH AT BEDTIME 30 tablet 3  . torsemide (DEMADEX) 20 MG tablet Take 2 tablets (40 mg total) by mouth daily. 60 tablet 6  . aspirin 81 MG chewable tablet Chew 324 mg by mouth once. Reported on 01/20/2016    . erythromycin ophthalmic ointment Place 1 application into both eyes 2 (two) times daily. (Patient not taking: Reported on 01/20/2016) 3.5 g 0  . KLOR-CON M20 20 MEQ tablet TAKE THREE TABLETS BY MOUTH ONCE DAILY (Patient not taking: Reported on 01/20/2016) 90 tablet 0  . metolazone (ZAROXOLYN) 2.5 MG tablet Take 1 tablet (2.5 mg total) by mouth as needed (for weight 136 lb or greater). (Patient not taking: Reported on 01/20/2016) 10 tablet 3     . amiodarone  200 mg Oral Daily  . apixaban  2.5 mg Oral BID  . ferrous sulfate  325 mg Oral BID WC  . levothyroxine  75 mcg Oral QAC breakfast  . pantoprazole  40 mg Oral BID  .  potassium chloride SA  60 mEq Oral Daily  . simvastatin  10 mg Oral QHS  . sodium chloride flush  3 mL Intravenous Q12H    Infusions:    Allergies  Allergen Reactions  . Other Shortness Of Breath    Detergents, perfumes and other strong odor emitting compounds  . Tramadol Shortness Of Breath and Nausea Only  . Ibuprofen Other (See Comments)     Told not to take med    Social History   Social History  . Marital Status: Widowed    Spouse Name: N/A  . Number of Children: 3  . Years of Education: N/A   Occupational History  .      [employer], book keeping   Social History Main Topics  . Smoking status: Never Smoker   . Smokeless tobacco: Never Used  . Alcohol Use: No  . Drug Use: No  . Sexual Activity: No   Other Topics Concern  . 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 Eats with her.      Daughter handles medications. Sees HH and PT once a week.    Family History  Problem Relation Age of Onset  . Coronary artery disease Father   . Heart disease Father   . Lung cancer    . Alzheimer's disease Mother   . Cancer Brother 94    lung cancer  . Heart attack Father     PHYSICAL EXAM: Filed Vitals:   01/21/16 1005 01/21/16 1047  BP: 109/51 118/59  Pulse: 72   Temp:  97.5 F (36.4 C)  Resp: 18 18    No intake or output data in the 24 hours ending 01/21/16 1125  PHYSICAL EXAM: General: Elderly Frail appearing. No resp difficulty. Caregiver present. HEENT: Normal Neck: supple. JVP 5-6 Carotids 2+ bilaterally; no bruits. No lymphadenopathy or thryomegaly appreciated. Cor: PMI normal. Regular rate & rhythm. No rubs, gallops. 1/6 diastolic murmur. R upper chest mild ecchymotic.  Lungs: clear Abdomen: soft, nontender, nondistended. No hepatosplenomegaly. No bruits or masses. Good bowel sounds. Extremities: no cyanosis, clubbing, rash, aricose veins. No edema noted. Neuro: alert & orientedx3, cranial nerves grossly intact. Moves all 4 extremities w/o difficulty. Affect pleasant.   ECG: NSR with LBBB 9(no change)  Results for orders placed or performed during the  hospital encounter of 01/20/16 (from the past 24 hour(s))  I-stat troponin, ED (not at [facility])     Status: None   Collection Time: 01/20/16  8:05 PM  Result Value Ref Range   Troponin i, poc 0.04 0.00 - 0.08 ng/mL   Comment 3          Basic metabolic panel     Status: Abnormal   Collection Time: 01/20/16  8:09 PM  Result Value Ref Range   Sodium 143 135 - 145 mmol/L   Potassium 4.0 3.5 - 5.1 mmol/L   Chloride 107 101 - 111 mmol/L   CO2 25 22 - 32 mmol/L   Glucose, Bld 120 (H) 65 - 99 mg/dL   BUN 39 (H) 6 - 20 mg/dL   Creatinine, Ser 2.00 (H) 0.44 - 1.00 mg/dL  Calcium 8.9 8.9 - 10.3 mg/dL   GFR calc non Af Amer 21 (L) >60 mL/min   GFR calc Af Amer 24 (L) >60 mL/min   Anion gap 11 5 - 15  CBC     Status: Abnormal   Collection Time: 01/20/16  8:09 PM  Result Value Ref Range   WBC 5.2 4.0 - 10.5 K/uL   RBC 4.12 3.87 - 5.11 MIL/uL   Hemoglobin 12.6 12.0 - 15.0 g/dL   HCT 39.1 36.0 - 46.0 %   MCV 94.9 78.0 - 100.0 fL   MCH 30.6 26.0 - 34.0 pg   MCHC 32.2 30.0 - 36.0 g/dL   RDW 14.4 11.5 - 15.5 %   Platelets 125 (L) 150 - 400 K/uL  I-stat troponin, ED     Status: None   Collection Time: 01/21/16 12:17 AM  Result Value Ref Range   Troponin i, poc 0.03 0.00 - 0.08 ng/mL   Comment 3          Phosphorus     Status: None   Collection Time: 01/21/16  6:58 AM  Result Value Ref Range   Phosphorus 4.0 2.5 - 4.6 mg/dL  Magnesium     Status: None   Collection Time: 01/21/16  6:58 AM  Result Value Ref Range   Magnesium 2.3 1.7 - 2.4 mg/dL  Troponin I     Status: None   Collection Time: 01/21/16  6:58 AM  Result Value Ref Range   Troponin I 0.03 <0.031 ng/mL  Brain natriuretic peptide     Status: Abnormal   Collection Time: 01/21/16  6:58 AM  Result Value Ref Range   B Natriuretic Peptide 261.5 (H) 0.0 - 100.0 pg/mL  Lipase, blood     Status: None   Collection Time: 01/21/16 10:15 AM  Result Value Ref Range   Lipase 33 11 - 51 U/L  Comprehensive metabolic panel     Status:  Abnormal   Collection Time: 01/21/16 10:15 AM  Result Value Ref Range   Sodium 144 135 - 145 mmol/L   Potassium 4.6 3.5 - 5.1 mmol/L   Chloride 108 101 - 111 mmol/L   CO2 27 22 - 32 mmol/L   Glucose, Bld 103 (H) 65 - 99 mg/dL   BUN 38 (H) 6 - 20 mg/dL   Creatinine, Ser 1.88 (H) 0.44 - 1.00 mg/dL   Calcium 8.8 (L) 8.9 - 10.3 mg/dL   Total Protein 6.1 (L) 6.5 - 8.1 g/dL   Albumin 3.2 (L) 3.5 - 5.0 g/dL   AST 18 15 - 41 U/L   ALT 17 14 - 54 U/L   Alkaline Phosphatase 56 38 - 126 U/L   Total Bilirubin 0.7 0.3 - 1.2 mg/dL   GFR calc non Af Amer 23 (L) >60 mL/min   GFR calc Af Amer 26 (L) >60 mL/min   Anion gap 9 5 - 15   Dg Chest 2 View  01/20/2016  CLINICAL DATA:  Chest discomfort for 1 day.  Initial encounter. EXAM: CHEST  2 VIEW COMPARISON:  PA and lateral chest 11/04/2015 10/08/2015. FINDINGS: Minimal atelectasis or scar is seen in the left lung base. The lungs are otherwise clear. Heart size is normal. No pneumothorax or pleural effusion. Pacing device is noted. Postoperative change right shoulder also seen. Convex left scoliosis is identified. IMPRESSION: No acute disease. Electronically Signed   By: Inge Rise M.D.   On: 01/20/2016 20:26     ASSESSMENT/Plan: 1. Chest pain -once again,  this is unlikely cardiac. Recent cath, stress test reviewed with her and all ok. Cardiac markers negative. No further cardiac w/u needed 2. Chronic Diastolic Heart Failure - Volume status looks good 3.PAF - symptomatic bradycardia s/p PPM. Remains in NSR on amio.  Peviously thought to have failed amiodarone and had chronic AF. However in November in the  - Continue amiodarone 200 mg daily. Continue Eliquis 2.5 mg twice a day. No bleeding problems.  4. CKD stage III - stable  Kiriana Worthington,MD 11:37 AM

## 2016-01-21 NOTE — ED Notes (Signed)
Heart healthy breakfast tray ordered 

## 2016-01-21 NOTE — H&P (Signed)
[facility] History and Physical  [name] [id number] DOB: 1925/10/21 DOA: 01/20/2016  Referring physician: Sherwood Gambler, M.D. PCP: [name], MD   Chief Complaint: Chest pain  HPI: [name] is a 82 y.o. female with a past medical history of hyperlipidemia, hypertension, thrombocytopenia, combine systolic and diastolic CHF, paroxysmal atrial fibrillation, GERD, hypothyroidism, monoclonal gammopathy who comes to the emergency department due to having chest pain.  Per patient, for the past 2 days she has had pressure-like, nonradiating, stinging discomfort or alternating with a cold sensation in her chest, without dyspnea, palpitations, diaphoresis, dizziness, nausea. She denies PND, orthopnea or pitting edema of the lower extremities. There are no worsening or relieving factors. She was given nitroglycerin, which did not change the pain symptomatology, but decreased her blood pressure significantly. EMS reported that she was initially [id number] mmHg systolic and subsequently dropped to 63 mmHg post second sublingual nitroglycerin. Her workup in the emergency department shows an EKG without any significant changes and negative biomarkers so far.  When seen in the emergency department, the patient was in no acute distress, but stated she was hungry. Orange juice, crackers and peanut butter were provided.    Review of Systems:  Constitutional:  Positive chills in her chest, mild fatigue.  No weight loss, night sweats, Fevers HEENT:  No headaches, Difficulty swallowing,Tooth/dental problems,Sore throat,  No sneezing, itching, ear ache, nasal congestion, post nasal drip,  Cardio-vascular:  As above mentioned. GI:  No heartburn, indigestion, abdominal pain, nausea, vomiting, diarrhea, change in bowel habits, loss of appetite  Resp:  No shortness of breath with exertion or at rest. No excess mucus, no productive cough, No non-productive cough, No coughing up  of blood.No change in color of mucus.No wheezing.No chest wall deformity  Skin:  No rash or lesions.  GU:  No dysuria, change in color of urine, no urgency or frequency. No flank pain.  Musculoskeletal:  No joint pain or swelling. No decreased range of motion. No back pain.  Psych:  No change in mood or affect. No depression or anxiety. No memory loss.   Past Medical History  Diagnosis Date  . Dyslipidemia   . Hypertension   . Palpitations     PVCs/bigeminy on event in May 2009 revealing this was relatively asymptomatic  . Chest pain     a. 2011 Neg MV;  b. 01/2014 Cath: LM 20-30, LAD nl, D1 nl, LCX nl, OM1 nl, RCA dom 30-40m, PD/PL nl, EF 65%->Med Rx.  . Degenerative joint disease   . Hx of varicella   . History of skin cancer     tafeen/ non melanoma.   . Thrombocytopenia ([place])   . Anemia   . Monoclonal gammopathies   . Right lower quadrant abdominal pain 07/19/2012    Recurrent  With nausea    This is new since she had ct for llq pain in [facility]    R/o appendiceal problem  Hernia  Get ct scan  And plan  Fu     . Diastolic CHF, acute on chronic ([place]) 01/31/2012  . Atrial fibrillation ([place])     a. Dx 01/2014->Eliquis started.  . Cancer ([place])   . Pacemaker   . Dysrhythmia   . Family history of adverse reaction to anesthesia     " multiple family members have difficulty waking "  . GERD (gastroesophageal reflux disease)   . Hypothyroidism    Past Surgical History  Procedure Laterality Date  . Cholecystectomy  1999  .  Cesarean section      times 2  . Shoulder surgery  1996    Right   . Cataract extraction      Bilateral  implantt  . Cardioversion N/A 02/26/2014    Procedure: CARDIOVERSION AT BEDSIDE;  Surgeon: Pixie Casino, MD;  Location: [facility];  Service: Cardiovascular;  Laterality: N/A;  . Cardioversion N/A 04/22/2014    Procedure: CARDIOVERSION (BEDSIDE);  Surgeon: [name], MD;  Location: [facility] OR;  Service: Cardiovascular;  Laterality: N/A;  . Pacemaker insertion   04/23/2014    STJ Assurity dual chamber pacemaker implanted by Dr Rayann Heman  . Insert / replace / remove pacemaker    . Esophagogastroduodenoscopy N/A 07/06/2014    Procedure: ESOPHAGOGASTRODUODENOSCOPY (EGD);  Surgeon: Ladene Artist, MD;  Location: [facility] ENDOSCOPY;  Service: Endoscopy;  Laterality: N/A;  . Left heart catheterization with coronary angiogram N/A 01/29/2014    Procedure: LEFT HEART CATHETERIZATION WITH CORONARY ANGIOGRAM;  Surgeon: Blane Ohara, MD;  Location: [facility] CATH LAB;  Service: Cardiovascular;  Laterality: N/A;  . Permanent pacemaker insertion N/A 04/23/2014    Procedure: PERMANENT PACEMAKER INSERTION;  Surgeon: Evans Lance, MD;  Location: [facility] CATH LAB;  Service: Cardiovascular;  Laterality: N/A;  . Right heart catheterization N/A 09/22/2014    Procedure: RIGHT HEART CATH;  Surgeon: Jolaine Artist, MD;  Location: [facility] CATH LAB;  Service: Cardiovascular;  Laterality: N/A;   Social History:  reports that she has never smoked. She has never used smokeless tobacco. She reports that she does not drink alcohol or use illicit drugs.  Allergies  Allergen Reactions  . Other Shortness Of Breath    Detergents, perfumes and other strong odor emitting compounds  . Tramadol Shortness Of Breath and Nausea Only  . Ibuprofen Other (See Comments)    Told not to take med    Family History  Problem Relation Age of Onset  . Coronary artery disease Father   . Heart disease Father   . Lung cancer    . Alzheimer's disease Mother   . Cancer Brother 32    lung cancer  . Heart attack Father     Prior to Admission medications   Medication Sig Start Date End Date Taking? Authorizing Provider  acetaminophen (TYLENOL) 325 MG tablet Take 2 tablets (650 mg total) by mouth every 4 (four) hours as needed for headache or mild pain. 09/26/14  Yes Isaiah Serge, NP  amiodarone (PACERONE) 200 MG tablet TAKE ONE TABLET BY MOUTH ONCE DAILY 11/17/15  Yes Evans Lance, MD  apixaban (ELIQUIS) 2.5 MG  TABS tablet Take 1 tablet (2.5 mg total) by mouth 2 (two) times daily. 08/03/15  Yes Jolaine Artist, MD  ferrous sulfate 325 (65 FE) MG tablet TAKE ONE TABLET BY MOUTH TWICE DAILY WITH A MEAL 12/04/15  Yes Burnis Medin, MD  levothyroxine (SYNTHROID, LEVOTHROID) 75 MCG tablet TAKE ONE TABLET BY MOUTH ONCE DAILY 11/11/15  Yes Burnis Medin, MD  loratadine (CLARITIN) 10 MG tablet Take 10 mg by mouth at bedtime as needed for allergies or rhinitis.    Yes Historical Provider, MD  nitroGLYCERIN (NITROSTAT) 0.4 MG SL tablet Place 1 tablet (0.4 mg total) under the tongue every 5 (five) minutes as needed. For chest pain 12/02/13  Yes Fay Records, MD  oxyCODONE-acetaminophen (PERCOCET/ROXICET) 5-325 MG per tablet Take 0.5-1 tablets by mouth every 6 (six) hours as needed for severe pain. 07/31/15  Yes Marin Olp, MD  pantoprazole ([place])  40 MG tablet TAKE ONE TABLET BY MOUTH ONCE DAILY 10/05/15  Yes Burnis Medin, MD  potassium chloride SA (K-DUR,KLOR-CON) 20 MEQ tablet Take 60 mEq by mouth daily.   Yes Historical Provider, MD  simvastatin (ZOCOR) 10 MG tablet TAKE ONE TABLET BY MOUTH AT BEDTIME 10/19/15  Yes Jolaine Artist, MD  torsemide (DEMADEX) 20 MG tablet Take 2 tablets (40 mg total) by mouth daily. 10/28/15  Yes Jolaine Artist, MD  aspirin 81 MG chewable tablet Chew 324 mg by mouth once. Reported on 01/20/2016    Historical Provider, MD  erythromycin ophthalmic ointment Place 1 application into both eyes 2 (two) times daily. Patient not taking: Reported on 01/20/2016 05/19/15   Burnis Medin, MD  KLOR-CON M20 20 MEQ tablet TAKE THREE TABLETS BY MOUTH ONCE DAILY Patient not taking: Reported on 01/20/2016 01/05/16   Jolaine Artist, MD  metolazone (ZAROXOLYN) 2.5 MG tablet Take 1 tablet (2.5 mg total) by mouth as needed (for weight 136 lb or greater). Patient not taking: Reported on 01/20/2016 12/16/14   Jolaine Artist, MD   Physical Exam: Filed Vitals:   01/20/16 1955 01/20/16 2000  01/20/16 2315 01/20/16 2330  BP: 116/60 105/58 100/59 109/63  Pulse: 73 69 70 70  Temp: 98.3 F (36.8 C)     TempSrc: Oral     Resp: 18 18 17    SpO2: 96% 95% 96% 96%    Wt Readings from Last 3 Encounters:  10/21/15 56.246 kg (124 lb)  10/08/15 58.514 kg (129 lb)  09/23/15 59.421 kg (131 lb)    General:  Appears calm and comfortable Eyes: PERRL, normal lids, irises & conjunctiva ENT: grossly normal hearing, lips & tongue Neck: no LAD, masses or thyromegaly Cardiovascular: RRR, no m/r/g. No LE edema. Telemetry: SR, no arrhythmias  Respiratory: CTA bilaterally, no w/r/r. Normal respiratory effort. Abdomen: soft, ntnd Skin: no rash or induration seen on limited exam Musculoskeletal: grossly normal tone BUE/BLE Psychiatric: grossly normal mood and affect, speech fluent and appropriate Neurologic: Awake, alert, oriented 3, grossly non-focal.          Labs on Admission:  Basic Metabolic Panel:  Recent Labs Lab 01/20/16 2009  NA 143  K 4.0  CL 107  CO2 25  GLUCOSE 120*  BUN 39*  CREATININE 2.00*  CALCIUM 8.9   CBC:  Recent Labs Lab 01/20/16 2009  WBC 5.2  HGB 12.6  HCT 39.1  MCV 94.9  PLT 125*    BNP (last 3 results)  Recent Labs  05/09/15 1529 09/20/15 1605 11/04/15 1630  BNP 294.6* 181.4* 228.5*    Radiological Exams on Admission: Dg Chest 2 View  01/20/2016  CLINICAL DATA:  Chest discomfort for 1 day.  Initial encounter. EXAM: CHEST  2 VIEW COMPARISON:  PA and lateral chest 11/04/2015 10/08/2015. FINDINGS: Minimal atelectasis or scar is seen in the left lung base. The lungs are otherwise clear. Heart size is normal. No pneumothorax or pleural effusion. Pacing device is noted. Postoperative change right shoulder also seen. Convex left scoliosis is identified. IMPRESSION: No acute disease. Electronically Signed   By: Inge Rise M.D.   On: 01/20/2016 20:26   Echocardiogram  03/18/2015  ------------------------------------------------------------------- LV EF: 45% - 50%  ------------------------------------------------------------------- Indications: Chest pain 786.51.  ------------------------------------------------------------------- History: PMH: Atrial fibrillation. Coronary artery disease. Congestive heart failure. PMH: Myocardial infarction. Risk factors: Hypertension. Dyslipidemia.  ------------------------------------------------------------------- Study Conclusions  - Left ventricle: The cavity size was normal. There was moderate  focal basal and  mild concentric hypertrophy. Systolic function  was mildly reduced. The estimated ejection fraction was in the  range of 45% to 50%. There is hypokinesis of the mid inferoseptal  and apical septal myocardium. Features are consistent with a  pseudonormal left ventricular filling pattern, with concomitant  abnormal relaxation and increased filling pressure (grade 2  diastolic dysfunction). - Aortic valve: Moderate thickening and calcification, consistent  with sclerosis. There was trivial regurgitation. - Mitral valve: There appears to be some degree of mitral valve  stenosis. The calculated MVA is 1.14cm2 c/w moderate MS but mean  gradient was normal at 1mmHg and PHT was not obtained. Recommend  repeat limited echo with emphasis on the MVA calculations. Mild  focal calcification of the anterior leaflet (medial segment(s)).  Mobility was restricted. Diastolic leaflet doming was present.  There was mild regurgitation. Valve area by continuity equation  (using LVOT flow): 1.14 cm^2. - Left atrium: The atrium was severely dilated. - Pulmonic valve: There was trivial regurgitation. - Pulmonary arteries: PA peak pressure: 34 mm Hg (S).  EKG: Independently reviewed. Vent. rate 70 BPM PR interval 189 ms QRS duration 154 ms QT/QTc 477/515 ms P-R-T axes 93 122  -34 Sinus rhythm Anterolateral infarct, age indeterminate Prolonged QT interval Baseline wander in lead(s) II III aVF no significant change since Dec 2016  Assessment/Plan Principal Problem:   Atypical chest pain Admit to telemetry/observation. Continue supplemental oxygen. Analgesics as needed. Trend troponin levels. Check repeat EKG in a.m. Check echocardiogram in a.m.  Active Problems:   Hyperlipidemia Continue simvastatin 10 mg by mouth at bedtime. Monitor LFTs periodically.    HYPERTENSION, BENIGN Hold torsemide due to hypotension. Monitor her blood pressure periodically.    Hypothyroidism Continue levothyroxine 75 g by mouth daily. Monitor TSH periodically.    Atrial fibrillation (HCC) Continue amiodarone 200 mg by mouth daily for rate control. Continue Eliquis 2.5 mg by mouth twice a day for prophylactic anticoagulation.    CKD (chronic kidney disease) stage 4, GFR 15-29 ml/min (HCC) Hold torsemide. Monitor BUN/creatinine and electrolytes.     Code Status: Full code. DVT Prophylaxis: On Eliquis. Family Communication: Disposition Plan: Admit to telemetry/observation, troponin levels trending.  Time spent: Over 70 minutes were is in the process of this admission.  Reubin Milan, M.D. [facility] Pager [phone].

## 2016-01-22 ENCOUNTER — Observation Stay (HOSPITAL_BASED_OUTPATIENT_CLINIC_OR_DEPARTMENT_OTHER): Payer: Medicare Other

## 2016-01-22 ENCOUNTER — Observation Stay (HOSPITAL_COMMUNITY): Payer: Medicare Other

## 2016-01-22 ENCOUNTER — Ambulatory Visit: Payer: Medicare Other | Admitting: Internal Medicine

## 2016-01-22 DIAGNOSIS — N184 Chronic kidney disease, stage 4 (severe): Secondary | ICD-10-CM

## 2016-01-22 DIAGNOSIS — R0789 Other chest pain: Secondary | ICD-10-CM | POA: Diagnosis not present

## 2016-01-22 DIAGNOSIS — R079 Chest pain, unspecified: Secondary | ICD-10-CM | POA: Diagnosis not present

## 2016-01-22 DIAGNOSIS — I10 Essential (primary) hypertension: Secondary | ICD-10-CM

## 2016-01-22 DIAGNOSIS — I481 Persistent atrial fibrillation: Secondary | ICD-10-CM

## 2016-01-22 LAB — CBC
HCT: 40.2 % (ref 36.0–46.0)
HEMOGLOBIN: 12.3 g/dL (ref 12.0–15.0)
MCH: 29.4 pg (ref 26.0–34.0)
MCHC: 30.6 g/dL (ref 30.0–36.0)
MCV: 95.9 fL (ref 78.0–100.0)
PLATELETS: 115 10*3/uL — AB (ref 150–400)
RBC: 4.19 MIL/uL (ref 3.87–5.11)
RDW: 14.7 % (ref 11.5–15.5)
WBC: 4.3 10*3/uL (ref 4.0–10.5)

## 2016-01-22 LAB — TROPONIN I
TROPONIN I: 0.04 ng/mL — AB (ref <0.031)
TROPONIN I: 0.04 ng/mL — AB (ref <0.031)
Troponin I: 0.05 ng/mL — ABNORMAL HIGH (ref <0.031)

## 2016-01-22 LAB — COMPREHENSIVE METABOLIC PANEL
ALBUMIN: 3.1 g/dL — AB (ref 3.5–5.0)
ALK PHOS: 55 U/L (ref 38–126)
ALT: 16 U/L (ref 14–54)
AST: 16 U/L (ref 15–41)
Anion gap: 7 (ref 5–15)
BUN: 32 mg/dL — ABNORMAL HIGH (ref 6–20)
CHLORIDE: 111 mmol/L (ref 101–111)
CO2: 23 mmol/L (ref 22–32)
CREATININE: 1.62 mg/dL — AB (ref 0.44–1.00)
Calcium: 8.7 mg/dL — ABNORMAL LOW (ref 8.9–10.3)
GFR calc non Af Amer: 27 mL/min — ABNORMAL LOW (ref >60)
GFR, EST AFRICAN AMERICAN: 32 mL/min — AB (ref >60)
GLUCOSE: 94 mg/dL (ref 65–99)
Potassium: 4.7 mmol/L (ref 3.5–5.1)
SODIUM: 141 mmol/L (ref 135–145)
Total Bilirubin: 0.7 mg/dL (ref 0.3–1.2)
Total Protein: 6 g/dL — ABNORMAL LOW (ref 6.5–8.1)

## 2016-01-22 LAB — ECHOCARDIOGRAM COMPLETE
HEIGHTINCHES: 58 in
Weight: 2139.2 oz

## 2016-01-22 MED ORDER — TORSEMIDE 20 MG PO TABS
20.0000 mg | ORAL_TABLET | Freq: Two times a day (BID) | ORAL | Status: DC
  Administered 2016-01-22: 20 mg via ORAL
  Filled 2016-01-22: qty 1

## 2016-01-22 NOTE — Discharge Summary (Signed)
Physician Discharge Summary  [name] MRN: [id number] DOB/AGE: 02/03/1925 82 y.o.  PCP: [name], MD   Admit date: 01/20/2016 Discharge date: 01/22/2016  Discharge Diagnoses:     Principal Problem:   Atypical chest pain Active Problems:   Hyperlipidemia   HYPERTENSION, BENIGN   Hypothyroidism   Atrial fibrillation (HCC)   CKD (chronic kidney disease) stage 4, GFR 15-29 ml/min (HCC)    Follow-up recommendations Follow-up with PCP in 3-5 days , including all  additional recommended appointments as below Follow-up CBC, CMP in 3-5 days      Medication List    TAKE these medications        acetaminophen 325 MG tablet  Commonly known as:  TYLENOL  Take 2 tablets (650 mg total) by mouth every 4 (four) hours as needed for headache or mild pain.     amiodarone 200 MG tablet  Commonly known as:  PACERONE  TAKE ONE TABLET BY MOUTH ONCE DAILY     apixaban 2.5 MG Tabs tablet  Commonly known as:  ELIQUIS  Take 1 tablet (2.5 mg total) by mouth 2 (two) times daily.     aspirin 81 MG chewable tablet  Chew 324 mg by mouth once. Reported on 01/20/2016     erythromycin ophthalmic ointment  Place 1 application into both eyes 2 (two) times daily.     ferrous sulfate 325 (65 FE) MG tablet  TAKE ONE TABLET BY MOUTH TWICE DAILY WITH A MEAL     levothyroxine 75 MCG tablet  Commonly known as:  SYNTHROID, LEVOTHROID  TAKE ONE TABLET BY MOUTH ONCE DAILY     loratadine 10 MG tablet  Commonly known as:  CLARITIN  Take 10 mg by mouth at bedtime as needed for allergies or rhinitis.     metolazone 2.5 MG tablet  Commonly known as:  ZAROXOLYN  Take 1 tablet (2.5 mg total) by mouth as needed (for weight 136 lb or greater).     nitroGLYCERIN 0.4 MG SL tablet  Commonly known as:  NITROSTAT  Place 1 tablet (0.4 mg total) under the tongue every 5 (five) minutes as needed. For chest pain     oxyCODONE-acetaminophen 5-325 MG tablet  Commonly known as:  PERCOCET/ROXICET   Take 0.5-1 tablets by mouth every 6 (six) hours as needed for severe pain.     pantoprazole 40 MG tablet  Commonly known as:  PROTONIX  TAKE ONE TABLET BY MOUTH ONCE DAILY     potassium chloride SA 20 MEQ tablet  Commonly known as:  K-DUR,KLOR-CON  Take 60 mEq by mouth daily.     simvastatin 10 MG tablet  Commonly known as:  ZOCOR  TAKE ONE TABLET BY MOUTH AT BEDTIME     torsemide 20 MG tablet  Commonly known as:  DEMADEX  Take 2 tablets (40 mg total) by mouth daily.         Discharge Condition: stable   Discharge Instructions       Discharge Instructions    Diet - low sodium heart healthy    Complete by:  As directed      Increase activity slowly    Complete by:  As directed            Allergies  Allergen Reactions  . Other Shortness Of Breath    Detergents, perfumes and other strong odor emitting compounds  . Tramadol Shortness Of Breath and Nausea Only  . Ibuprofen Other (See Comments)    Told not to  take med      Disposition: 01-Home or Self Care   Consults:  cardiology    Significant Diagnostic Studies:  Dg Chest 2 View  01/20/2016  CLINICAL DATA:  Chest discomfort for 1 day.  Initial encounter. EXAM: CHEST  2 VIEW COMPARISON:  PA and lateral chest 11/04/2015 10/08/2015. FINDINGS: Minimal atelectasis or scar is seen in the left lung base. The lungs are otherwise clear. Heart size is normal. No pneumothorax or pleural effusion. Pacing device is noted. Postoperative change right shoulder also seen. Convex left scoliosis is identified. IMPRESSION: No acute disease. Electronically Signed   By: Inge Rise M.D.   On: 01/20/2016 20:26   Dg Esophagus  01/22/2016  CLINICAL DATA:  82 year old female with atypical chest pain, noncardiac chest pain. Gastroesophageal reflux disease on medical therapy. Initial encounter. EXAM: ESOPHOGRAM/BARIUM SWALLOW TECHNIQUE: Single contrast examination was performed using  barium. FLUOROSCOPY TIME:  Radiation  Exposure Index (as provided by the fluoroscopic device): If the device does not provide the exposure index: Fluoroscopy Time:  2 minutes 0 seconds COMPARISON:  Chest radiographs 01/20/2016.  Chest CT 09/10/2015. FINDINGS: A single contrast study was undertaken and the patient tolerated this well and without difficulty. She was asymptomatic throughout the exam. No obstruction to the forward flow of contrast throughout the esophagus and into the stomach. Normal esophageal course and contour. Esophageal motility is normal for age. A 12.5 mm barium tablet was administered and passed freely to the stomach without delay; the tablet was maintained at the level of the diaphragm, but on additional barium swallow was seen to be within a small gastric hiatal hernia (series 7, image 25). This can also be identified on the 2016 chest CT. Gastric emptying of the ingested barium is noted. IMPRESSION: Normal for age esophagram aside from a stable small gastric hiatal hernia. Electronically Signed   By: Genevie Ann M.D.   On: 01/22/2016 09:43    Echocardiogram results pending      Filed Weights   01/21/16 1036 01/22/16 0419  Weight: 61.145 kg (134 lb 12.8 oz) 60.646 kg (133 lb 11.2 oz)     Microbiology: No results found for this or any previous visit (from the past 240 hour(s)).     Blood Culture    Component Value Date/Time   SDES URINE, CLEAN CATCH 05/09/2015 1532   SPECREQUEST Normal 05/09/2015 1532   CULT  05/09/2015 1532    MULTIPLE SPECIES PRESENT, SUGGEST RECOLLECTION IF CLINICALLY INDICATED   REPTSTATUS 05/11/2015 FINAL 05/09/2015 1532      Labs: Results for orders placed or performed during the hospital encounter of 01/20/16 (from the past 48 hour(s))  I-stat troponin, ED (not at [facility], [facility])     Status: None   Collection Time: 01/20/16  8:05 PM  Result Value Ref Range   Troponin i, poc 0.04 0.00 - 0.08 ng/mL   Comment 3            Comment: Due to the release kinetics of cTnI, a negative  result within the first hours of the onset of symptoms does not rule out myocardial infarction with certainty. If myocardial infarction is still suspected, repeat the test at appropriate intervals.   Basic metabolic panel     Status: Abnormal   Collection Time: 01/20/16  8:09 PM  Result Value Ref Range   Sodium 143 135 - 145 mmol/L   Potassium 4.0 3.5 - 5.1 mmol/L   Chloride 107 101 - 111 mmol/L   CO2 25 22 -  32 mmol/L   Glucose, Bld 120 (H) 65 - 99 mg/dL   BUN 39 (H) 6 - 20 mg/dL   Creatinine, Ser 2.00 (H) 0.44 - 1.00 mg/dL   Calcium 8.9 8.9 - 10.3 mg/dL   GFR calc non Af Amer 21 (L) >60 mL/min   GFR calc Af Amer 24 (L) >60 mL/min    Comment: (NOTE) The eGFR has been calculated using the CKD EPI equation. This calculation has not been validated in all clinical situations. eGFR's persistently <60 mL/min signify possible Chronic Kidney Disease.    Anion gap 11 5 - 15  CBC     Status: Abnormal   Collection Time: 01/20/16  8:09 PM  Result Value Ref Range   WBC 5.2 4.0 - 10.5 K/uL   RBC 4.12 3.87 - 5.11 MIL/uL   Hemoglobin 12.6 12.0 - 15.0 g/dL   HCT 39.1 36.0 - 46.0 %   MCV 94.9 78.0 - 100.0 fL   MCH 30.6 26.0 - 34.0 pg   MCHC 32.2 30.0 - 36.0 g/dL   RDW 14.4 11.5 - 15.5 %   Platelets 125 (L) 150 - 400 K/uL  I-stat troponin, ED     Status: None   Collection Time: 01/21/16 12:17 AM  Result Value Ref Range   Troponin i, poc 0.03 0.00 - 0.08 ng/mL   Comment 3            Comment: Due to the release kinetics of cTnI, a negative result within the first hours of the onset of symptoms does not rule out myocardial infarction with certainty. If myocardial infarction is still suspected, repeat the test at appropriate intervals.   Phosphorus     Status: None   Collection Time: 01/21/16  6:58 AM  Result Value Ref Range   Phosphorus 4.0 2.5 - 4.6 mg/dL  Magnesium     Status: None   Collection Time: 01/21/16  6:58 AM  Result Value Ref Range   Magnesium 2.3 1.7 - 2.4 mg/dL   Troponin I     Status: None   Collection Time: 01/21/16  6:58 AM  Result Value Ref Range   Troponin I 0.03 <0.031 ng/mL    Comment:        NO INDICATION OF MYOCARDIAL INJURY.   Brain natriuretic peptide     Status: Abnormal   Collection Time: 01/21/16  6:58 AM  Result Value Ref Range   B Natriuretic Peptide 261.5 (H) 0.0 - 100.0 pg/mL  Lipase, blood     Status: None   Collection Time: 01/21/16 10:15 AM  Result Value Ref Range   Lipase 33 11 - 51 U/L  Comprehensive metabolic panel     Status: Abnormal   Collection Time: 01/21/16 10:15 AM  Result Value Ref Range   Sodium 144 135 - 145 mmol/L   Potassium 4.6 3.5 - 5.1 mmol/L    Comment: SLIGHT HEMOLYSIS   Chloride 108 101 - 111 mmol/L   CO2 27 22 - 32 mmol/L   Glucose, Bld 103 (H) 65 - 99 mg/dL   BUN 38 (H) 6 - 20 mg/dL   Creatinine, Ser 1.88 (H) 0.44 - 1.00 mg/dL   Calcium 8.8 (L) 8.9 - 10.3 mg/dL   Total Protein 6.1 (L) 6.5 - 8.1 g/dL   Albumin 3.2 (L) 3.5 - 5.0 g/dL   AST 18 15 - 41 U/L   ALT 17 14 - 54 U/L   Alkaline Phosphatase 56 38 - 126 U/L   Total Bilirubin 0.7  0.3 - 1.2 mg/dL   GFR calc non Af Amer 23 (L) >60 mL/min   GFR calc Af Amer 26 (L) >60 mL/min    Comment: (NOTE) The eGFR has been calculated using the CKD EPI equation. This calculation has not been validated in all clinical situations. eGFR's persistently <60 mL/min signify possible Chronic Kidney Disease.    Anion gap 9 5 - 15  CBC     Status: Abnormal   Collection Time: 01/21/16 10:15 AM  Result Value Ref Range   WBC 4.6 4.0 - 10.5 K/uL   RBC 4.05 3.87 - 5.11 MIL/uL   Hemoglobin 12.5 12.0 - 15.0 g/dL   HCT 38.4 36.0 - 46.0 %   MCV 94.8 78.0 - 100.0 fL   MCH 30.9 26.0 - 34.0 pg   MCHC 32.6 30.0 - 36.0 g/dL   RDW 14.7 11.5 - 15.5 %   Platelets 116 (L) 150 - 400 K/uL    Comment: PLATELET COUNT CONFIRMED BY SMEAR  Troponin I     Status: Abnormal   Collection Time: 01/21/16 11:35 AM  Result Value Ref Range   Troponin I 0.05 (H) <0.031 ng/mL     Comment:        PERSISTENTLY INCREASED TROPONIN VALUES IN THE RANGE OF 0.04-0.49 ng/mL CAN BE SEEN IN:       -UNSTABLE ANGINA       -CONGESTIVE HEART FAILURE       -MYOCARDITIS       -CHEST TRAUMA       -ARRYHTHMIAS       -LATE PRESENTING MYOCARDIAL INFARCTION       -COPD   CLINICAL FOLLOW-UP RECOMMENDED.   Troponin I     Status: Abnormal   Collection Time: 01/21/16  6:40 PM  Result Value Ref Range   Troponin I 0.05 (H) <0.031 ng/mL    Comment:        PERSISTENTLY INCREASED TROPONIN VALUES IN THE RANGE OF 0.04-0.49 ng/mL CAN BE SEEN IN:       -UNSTABLE ANGINA       -CONGESTIVE HEART FAILURE       -MYOCARDITIS       -CHEST TRAUMA       -ARRYHTHMIAS       -LATE PRESENTING MYOCARDIAL INFARCTION       -COPD   CLINICAL FOLLOW-UP RECOMMENDED.   Troponin I     Status: Abnormal   Collection Time: 01/22/16 12:29 AM  Result Value Ref Range   Troponin I 0.04 (H) <0.031 ng/mL    Comment:        PERSISTENTLY INCREASED TROPONIN VALUES IN THE RANGE OF 0.04-0.49 ng/mL CAN BE SEEN IN:       -UNSTABLE ANGINA       -CONGESTIVE HEART FAILURE       -MYOCARDITIS       -CHEST TRAUMA       -ARRYHTHMIAS       -LATE PRESENTING MYOCARDIAL INFARCTION       -COPD   CLINICAL FOLLOW-UP RECOMMENDED.   CBC     Status: Abnormal   Collection Time: 01/22/16 12:29 AM  Result Value Ref Range   WBC 4.3 4.0 - 10.5 K/uL   RBC 4.19 3.87 - 5.11 MIL/uL   Hemoglobin 12.3 12.0 - 15.0 g/dL   HCT 40.2 36.0 - 46.0 %   MCV 95.9 78.0 - 100.0 fL   MCH 29.4 26.0 - 34.0 pg   MCHC 30.6 30.0 - 36.0 g/dL   RDW 14.7  11.5 - 15.5 %   Platelets 115 (L) 150 - 400 K/uL    Comment: CONSISTENT WITH PREVIOUS RESULT  Comprehensive metabolic panel     Status: Abnormal   Collection Time: 01/22/16 12:29 AM  Result Value Ref Range   Sodium 141 135 - 145 mmol/L   Potassium 4.7 3.5 - 5.1 mmol/L   Chloride 111 101 - 111 mmol/L   CO2 23 22 - 32 mmol/L   Glucose, Bld 94 65 - 99 mg/dL   BUN 32 (H) 6 - 20 mg/dL   Creatinine,  Ser 1.62 (H) 0.44 - 1.00 mg/dL   Calcium 8.7 (L) 8.9 - 10.3 mg/dL   Total Protein 6.0 (L) 6.5 - 8.1 g/dL   Albumin 3.1 (L) 3.5 - 5.0 g/dL   AST 16 15 - 41 U/L   ALT 16 14 - 54 U/L   Alkaline Phosphatase 55 38 - 126 U/L   Total Bilirubin 0.7 0.3 - 1.2 mg/dL   GFR calc non Af Amer 27 (L) >60 mL/min   GFR calc Af Amer 32 (L) >60 mL/min    Comment: (NOTE) The eGFR has been calculated using the CKD EPI equation. This calculation has not been validated in all clinical situations. eGFR's persistently <60 mL/min signify possible Chronic Kidney Disease.    Anion gap 7 5 - 15  Troponin I     Status: Abnormal   Collection Time: 01/22/16  6:14 AM  Result Value Ref Range   Troponin I 0.05 (H) <0.031 ng/mL    Comment:        PERSISTENTLY INCREASED TROPONIN VALUES IN THE RANGE OF 0.04-0.49 ng/mL CAN BE SEEN IN:       -UNSTABLE ANGINA       -CONGESTIVE HEART FAILURE       -MYOCARDITIS       -CHEST TRAUMA       -ARRYHTHMIAS       -LATE PRESENTING MYOCARDIAL INFARCTION       -COPD   CLINICAL FOLLOW-UP RECOMMENDED.   Troponin I     Status: Abnormal   Collection Time: 01/22/16 11:41 AM  Result Value Ref Range   Troponin I 0.04 (H) <0.031 ng/mL    Comment:        PERSISTENTLY INCREASED TROPONIN VALUES IN THE RANGE OF 0.04-0.49 ng/mL CAN BE SEEN IN:       -UNSTABLE ANGINA       -CONGESTIVE HEART FAILURE       -MYOCARDITIS       -CHEST TRAUMA       -ARRYHTHMIAS       -LATE PRESENTING MYOCARDIAL INFARCTION       -COPD   CLINICAL FOLLOW-UP RECOMMENDED.      Lipid Panel     Component Value Date/Time   CHOL 141 04/14/2014 0656   TRIG 56 04/14/2014 0656   HDL 89 04/14/2014 0656   CHOLHDL 1.6 04/14/2014 0656   VLDL 11 04/14/2014 0656   LDLCALC 41 04/14/2014 0656     No results found for: HGBA1C   Lab Results  Component Value Date   LDLCALC 41 04/14/2014   CREATININE 1.62* 01/22/2016     Brief summary 82 y.o. female with the history of very mild nonobstructive CAD by  cardiac catheterization in 01/2014, persistent atrial fibrillation with tachy-brady s/p PMM, diastolic CHF, HTN, HL, anemia, and CKD. She has had several admits over past year for CP. Cath 3/15 minimal CAD. (Mid LM 20-30%, mid RCA 30-40%, EF 65%), presented with  chest pain. Ruled out for acute coronary syndrome. Seen by cardiology thought to be noncardiac chest pain.  Assessment and plan  Chest pain atypical-ruled out acute coronary syndrome, esophageal dysmotility, normal esophagogram, patient is on Eliquis and aspirin, slightly abnormal troponin 0.05, patient seen by cardiology, 2-D echo pending,  no further cardiac workup needed, ok to dc home per Dr Jeffie Pollock CXR ok. Troponin negative x 2. ECG with LBBB. No change   Paroxysmal atrial fibrillation status post pacemaker placement, currently normal sinus rhythm on amiodarone.   Chronic kidney disease stage III, baseline around 2.0, creatinine 2 to baseline today  Gastroesophageal reflux-continue PPI, can check esophagogram  Acute on chronic systolic diastolic heart failure-previous EF 45-50%, moderate mitral stenosis MVA is 1.14cm2 c/w moderate MS  Repeat 2-D echo pending Resume Demadex   Discharge Exam:   Blood pressure 97/59, pulse 74, temperature 98.4 F (36.9 C), temperature source Oral, resp. rate 16, height '4\' 10"'  (1.473 m), weight 60.646 kg (133 lb 11.2 oz), SpO2 100 %.  General: No acute respiratory distress Lungs: Clear to auscultation bilaterally without wheezes or crackles Cardiovascular: Regular rate and rhythm without murmur gallop or rub normal S1 and S2 Abdomen: Nontender, nondistended, soft, bowel sounds positive, no rebound, no ascites, no appreciable mass Extremities: No significant cyanosis, clubbing, or edema bilateral lower extremities    Follow-up Information    Follow up with [name], MD. Schedule an appointment as soon as possible for a visit in 3 days.   Specialties:  Internal Medicine,  Pediatrics   Contact information:   [place] [address] [phone]       Signed: Reyne Dumas 01/22/2016, 4:49 PM        Time spent >45 mins

## 2016-01-22 NOTE — Progress Notes (Signed)
[facility] PROGRESS NOTE  [name] [id number] DOB: 21-Aug-1925 DOA: 01/20/2016 PCP: [name], MD  Length of stay:    Assessment/Plan: Principal Problem:   Atypical chest pain Active Problems:   Hyperlipidemia   HYPERTENSION, BENIGN   Hypothyroidism   Atrial fibrillation (HCC)   CKD (chronic kidney disease) stage 4, GFR 15-29 ml/min [facility])   Brief summary 82 y.o. female with the history of very mild nonobstructive CAD by cardiac catheterization in 01/2014, persistent atrial fibrillation with tachy-brady s/p PMM, diastolic CHF, HTN, HL, anemia, and CKD. She has had several admits over past year for CP. Cath 3/15 minimal CAD. (Mid LM 20-30%, mid RCA 30-40%, EF 65%), presented with chest pain. Ruled out for acute coronary syndrome. Seen by cardiology thought to be noncardiac chest pain.  Assessment and plan  Chest pain atypical-ruled out acute coronary syndrome, esophageal dysmotility, normal esophagogram, patient is on Eliquis and aspirin, slightly abnormal troponin 0.05, patient seen by cardiology, 2-D echo pending, if this is okay no further cardiac workup needed  Paroxysmal atrial fibrillation status post pacemaker placement, currently normal sinus rhythm on amiodarone.  Chronic kidney disease stage III, baseline around 2.0, creatinine 2 to baseline today  Gastroesophageal reflux-continue PPI, can check esophagogram  Acute on chronic systolic diastolic heart failure-previous EF 45-50%, moderate mitral stenosis MVA is 1.14cm2 c/w moderate MS   Repeat 2-D echo pending Resume Demadex   DVT prophylaxsis Eliquis  Code Status:      Code Status Orders        Start     Ordered   01/21/16 0612  Full code   Continuous     01/21/16 0611   Family Communication: Discussed in detail with the patient, all imaging results, lab results explained to the patient   Disposition Plan:  Anticipate discharge tomorrow    Consultants:  Cardiology  Procedures:  *None  Antibiotics: Anti-infectives    None         HPI/Subjective: Complaining of shortness of breath this morning, ventricular paced rhythm  Objective: Filed Vitals:   01/21/16 1648 01/21/16 1952 01/21/16 2332 01/22/16 0419  BP: 105/44 104/48 104/54 97/59  Pulse: 71 70 73 74  Temp: 98 F (36.7 C) 97.6 F (36.4 C) 97.8 F (36.6 C) 98.4 F (36.9 C)  TempSrc: Oral Oral Oral Oral  Resp:  17 19 16   Height:      Weight:    60.646 kg (133 lb 11.2 oz)  SpO2: 99% 96% 95% 100%    Intake/Output Summary (Last 24 hours) at 01/22/16 1119 Last data filed at 01/21/16 2200  Gross per 24 hour  Intake    360 ml  Output      0 ml  Net    360 ml    Exam:  General: No acute respiratory distress Lungs: Clear to auscultation bilaterally without wheezes or crackles Cardiovascular: Regular rate and rhythm without murmur gallop or rub normal S1 and S2 Abdomen: Nontender, nondistended, soft, bowel sounds positive, no rebound, no ascites, no appreciable mass Extremities: No significant cyanosis, clubbing, or edema bilateral lower extremities     Data Review   Micro Results No results found for this or any previous visit (from the past 240 hour(s)).  Radiology Reports Dg Chest 2 View  01/20/2016  CLINICAL DATA:  Chest discomfort for 1 day.  Initial encounter. EXAM: CHEST  2 VIEW COMPARISON:  PA and lateral chest 11/04/2015 10/08/2015. FINDINGS: Minimal atelectasis or scar is seen in  the left lung base. The lungs are otherwise clear. Heart size is normal. No pneumothorax or pleural effusion. Pacing device is noted. Postoperative change right shoulder also seen. Convex left scoliosis is identified. IMPRESSION: No acute disease. Electronically Signed   By: Inge Rise M.D.   On: 01/20/2016 20:26   Dg Esophagus  01/22/2016  CLINICAL DATA:  82 year old female with atypical chest pain, noncardiac chest pain. Gastroesophageal reflux disease  on medical therapy. Initial encounter. EXAM: ESOPHOGRAM/BARIUM SWALLOW TECHNIQUE: Single contrast examination was performed using  barium. FLUOROSCOPY TIME:  Radiation Exposure Index (as provided by the fluoroscopic device): If the device does not provide the exposure index: Fluoroscopy Time:  2 minutes 0 seconds COMPARISON:  Chest radiographs 01/20/2016.  Chest CT 09/10/2015. FINDINGS: A single contrast study was undertaken and the patient tolerated this well and without difficulty. She was asymptomatic throughout the exam. No obstruction to the forward flow of contrast throughout the esophagus and into the stomach. Normal esophageal course and contour. Esophageal motility is normal for age. A 12.5 mm barium tablet was administered and passed freely to the stomach without delay; the tablet was maintained at the level of the diaphragm, but on additional barium swallow was seen to be within a small gastric hiatal hernia (series 7, image 25). This can also be identified on the 2016 chest CT. Gastric emptying of the ingested barium is noted. IMPRESSION: Normal for age esophagram aside from a stable small gastric hiatal hernia. Electronically Signed   By: Genevie Ann M.D.   On: 01/22/2016 09:43     CBC  Recent Labs Lab 01/20/16 2009 01/21/16 1015 01/22/16 0029  WBC 5.2 4.6 4.3  HGB 12.6 12.5 12.3  HCT 39.1 38.4 40.2  PLT 125* 116* 115*  MCV 94.9 94.8 95.9  MCH 30.6 30.9 29.4  MCHC 32.2 32.6 30.6  RDW 14.4 14.7 14.7    Chemistries   Recent Labs Lab 01/20/16 2009 01/21/16 0658 01/21/16 1015 01/22/16 0029  NA 143  --  144 141  K 4.0  --  4.6 4.7  CL 107  --  108 111  CO2 25  --  27 23  GLUCOSE 120*  --  103* 94  BUN 39*  --  38* 32*  CREATININE 2.00*  --  1.88* 1.62*  CALCIUM 8.9  --  8.8* 8.7*  MG  --  2.3  --   --   AST  --   --  18 16  ALT  --   --  17 16  ALKPHOS  --   --  56 55  BILITOT  --   --  0.7 0.7    ------------------------------------------------------------------------------------------------------------------ estimated creatinine clearance is 18.5 mL/min (by C-G formula based on Cr of 1.62). ------------------------------------------------------------------------------------------------------------------ No results for input(s): HGBA1C in the last 72 hours. ------------------------------------------------------------------------------------------------------------------ No results for input(s): CHOL, HDL, LDLCALC, TRIG, CHOLHDL, LDLDIRECT in the last 72 hours. ------------------------------------------------------------------------------------------------------------------ No results for input(s): TSH, T4TOTAL, T3FREE, THYROIDAB in the last 72 hours.  Invalid input(s): FREET3 ------------------------------------------------------------------------------------------------------------------ No results for input(s): VITAMINB12, FOLATE, FERRITIN, TIBC, IRON, RETICCTPCT in the last 72 hours.  Coagulation profile No results for input(s): INR, PROTIME in the last 168 hours.  No results for input(s): DDIMER in the last 72 hours.  Cardiac Enzymes  Recent Labs Lab 01/21/16 1840 01/22/16 0029 01/22/16 0614  TROPONINI 0.05* 0.04* 0.05*   ------------------------------------------------------------------------------------------------------------------ Invalid input(s): POCBNP   CBG: No results for input(s): GLUCAP in the last 168 hours.     Studies: Dg  Chest 2 View  01/20/2016  CLINICAL DATA:  Chest discomfort for 1 day.  Initial encounter. EXAM: CHEST  2 VIEW COMPARISON:  PA and lateral chest 11/04/2015 10/08/2015. FINDINGS: Minimal atelectasis or scar is seen in the left lung base. The lungs are otherwise clear. Heart size is normal. No pneumothorax or pleural effusion. Pacing device is noted. Postoperative change right shoulder also seen. Convex left scoliosis is identified.  IMPRESSION: No acute disease. Electronically Signed   By: Inge Rise M.D.   On: 01/20/2016 20:26   Dg Esophagus  01/22/2016  CLINICAL DATA:  82 year old female with atypical chest pain, noncardiac chest pain. Gastroesophageal reflux disease on medical therapy. Initial encounter. EXAM: ESOPHOGRAM/BARIUM SWALLOW TECHNIQUE: Single contrast examination was performed using  barium. FLUOROSCOPY TIME:  Radiation Exposure Index (as provided by the fluoroscopic device): If the device does not provide the exposure index: Fluoroscopy Time:  2 minutes 0 seconds COMPARISON:  Chest radiographs 01/20/2016.  Chest CT 09/10/2015. FINDINGS: A single contrast study was undertaken and the patient tolerated this well and without difficulty. She was asymptomatic throughout the exam. No obstruction to the forward flow of contrast throughout the esophagus and into the stomach. Normal esophageal course and contour. Esophageal motility is normal for age. A 12.5 mm barium tablet was administered and passed freely to the stomach without delay; the tablet was maintained at the level of the diaphragm, but on additional barium swallow was seen to be within a small gastric hiatal hernia (series 7, image 25). This can also be identified on the 2016 chest CT. Gastric emptying of the ingested barium is noted. IMPRESSION: Normal for age esophagram aside from a stable small gastric hiatal hernia. Electronically Signed   By: Genevie Ann M.D.   On: 01/22/2016 09:43      No results found for: HGBA1C Lab Results  Component Value Date   LDLCALC 41 04/14/2014   CREATININE 1.62* 01/22/2016       Scheduled Meds: . amiodarone  200 mg Oral Daily  . apixaban  2.5 mg Oral BID  . ferrous sulfate  325 mg Oral BID WC  . levothyroxine  75 mcg Oral QAC breakfast  . pantoprazole  40 mg Oral BID  . potassium chloride SA  60 mEq Oral Daily  . simvastatin  10 mg Oral QHS  . sodium chloride flush  3 mL Intravenous Q12H  . torsemide  20 mg Oral  BID   Continuous Infusions:   Principal Problem:   Atypical chest pain Active Problems:   Hyperlipidemia   HYPERTENSION, BENIGN   Hypothyroidism   Atrial fibrillation (HCC)   CKD (chronic kidney disease) stage 4, GFR 15-29 ml/min (HCC)    Time spent: 45 minutes   [facility] Pager [phone]. If 7PM-7AM, please contact night-coverage at [email or web address], password [facility] 01/22/2016, 11:19 AM

## 2016-01-22 NOTE — Progress Notes (Signed)
Patient given discharge instructions and patient would like to take remaining daily medications due at home. Family to come and pick up patient. No further questions at this time.

## 2016-01-22 NOTE — Progress Notes (Signed)
Echocardiogram 2D Echocardiogram has been performed.  [name] 01/22/2016, 3:12 PM

## 2016-01-22 NOTE — Progress Notes (Signed)
Remote pacemaker transmission.   

## 2016-01-22 NOTE — Care Management Note (Signed)
Case Management Note  Patient Details  Name: [name] MRN: [id number] Date of Birth: 1925/02/07  Subjective/Objective:   EDCM consulted to arrange home health services.                 Action/Plan: Patient to be discharged to home.  PT consult recommending home health PT.  Unit RN Janett Billow reports the patient is doing well.  She reports she has spoke to patient who has has PT with [place] and would like to be set up with services with [place] again.  Home health orders placed by Dr. Allyson Sabal with face to face.  [facility], [facility], [facility] faxed home health orders to [facility] with confirmation of receipt.  No further EDCM needs at this time.   Expected Discharge Date:                  Expected Discharge Plan:  [place]  In-House Referral:     Discharge planning Services  CM Consult  Post Acute Care Choice:  Home Health Choice offered to:  Patient  DME Arranged:    DME Agency:     HH Arranged:  PT, OT, Nurse's Aide HH Agency:  [facility]  Status of Service:  Completed, signed off  Medicare Important Message Given:    Date Medicare IM Given:    Medicare IM give by:    Date Additional Medicare IM Given:    Additional Medicare Important Message give by:     If discussed at [place] of Stay Meetings, dates discussed:    Additional CommentsLivia Snellen, RN 01/22/2016, 5:50 PM

## 2016-01-22 NOTE — Care Management Obs Status (Signed)
[facility] NOTIFICATION   Patient Details  Name: BERDEAN THIELEMANN MRN: [id number] Date of Birth: [birth year]   Medicare Observation Status Notification Given:  Yes (Chest pain)    Bethena Roys, RN 01/22/2016, 2:06 PM

## 2016-01-22 NOTE — Evaluation (Signed)
Clinical/Bedside Swallow Evaluation Patient Details  Name: [name] MRN: [id number] Date of Birth: 08-30-1925  Today's Date: 01/22/2016 Time: SLP Start Time (ACUTE ONLY): 1146 SLP Stop Time (ACUTE ONLY): 1200 SLP Time Calculation (min) (ACUTE ONLY): 14 min  Past Medical History:  Past Medical History  Diagnosis Date  . Dyslipidemia   . Hypertension   . Palpitations     PVCs/bigeminy on event in May 2009 revealing this was relatively asymptomatic  . Chest pain     a. 2011 Neg MV;  b. 01/2014 Cath: LM 20-30, LAD nl, D1 nl, LCX nl, OM1 nl, RCA dom 30-6m, PD/PL nl, EF 65%->Med Rx.  . Degenerative joint disease   . Hx of varicella   . History of skin cancer     tafeen/ non melanoma.   . Thrombocytopenia ([place])   . Anemia   . Monoclonal gammopathies   . Right lower quadrant abdominal pain 07/19/2012    Recurrent  With nausea    This is new since she had ct for llq pain in [place]    R/o appendiceal problem  Hernia  Get ct scan  And plan  Fu     . Diastolic CHF, acute on chronic ([place]) 01/31/2012  . Atrial fibrillation ([place])     a. Dx 01/2014->Eliquis started.  . Cancer ([place])   . Pacemaker   . Dysrhythmia   . Family history of adverse reaction to anesthesia     " multiple family members have difficulty waking "  . GERD (gastroesophageal reflux disease)   . Hypothyroidism    Past Surgical History:  Past Surgical History  Procedure Laterality Date  . Cholecystectomy  1999  . Cesarean section      times 2  . Shoulder surgery  1996    Right   . Cataract extraction      Bilateral  implantt  . Cardioversion N/A 02/26/2014    Procedure: CARDIOVERSION AT BEDSIDE;  Surgeon: Pixie Casino, MD;  Location: [place];  Service: Cardiovascular;  Laterality: N/A;  . Cardioversion N/A 04/22/2014    Procedure: CARDIOVERSION (BEDSIDE);  Surgeon: [name], MD;  Location[facility];  Service: Cardiovascular;  Laterality: N/A;  . Pacemaker insertion  04/23/2014    STJ Assurity dual chamber  pacemaker implanted by Dr Rayann Heman  . Insert / replace / remove pacemaker    . Esophagogastroduodenoscopy N/A 07/06/2014    Procedure: ESOPHAGOGASTRODUODENOSCOPY (EGD);  Surgeon: Ladene Artist, MD;  Location: [facility] ENDOSCOPY;  Service: Endoscopy;  Laterality: N/A;  . Left heart catheterization with coronary angiogram N/A 01/29/2014    Procedure: LEFT HEART CATHETERIZATION WITH CORONARY ANGIOGRAM;  Surgeon: Blane Ohara, MD;  Location: [facility] CATH LAB;  Service: Cardiovascular;  Laterality: N/A;  . Permanent pacemaker insertion N/A 04/23/2014    Procedure: PERMANENT PACEMAKER INSERTION;  Surgeon: Evans Lance, MD;  Location[facility] CATH LAB;  Service: Cardiovascular;  Laterality: N/A;  . Right heart catheterization N/A 09/22/2014    Procedure: RIGHT HEART CATH;  Surgeon: Jolaine Artist, MD;  Location: [facility] CATH LAB;  Service: Cardiovascular;  Laterality: N/A;   HPI:  82 y.o. female with the history of very mild nonobstructive CAD by cardiac catheterization in 01/2014, persistent atrial fibrillation with tachy-brady s/p PMM, diastolic CHF, HTN, HL, anemia, and CKD. Pt reprots chest pain, suspected to be esophageal in origin, but esophagram normal.    Assessment / Plan / Recommendation Clinical Impression  Pt demonstrates normal swallow function at bedside. She denies any history  of globus, reports she cuts up food very well given some missing dentition. No report or observation suggests oropharyngeal or esophageal dysphagia. Recommend pt continue current diet, SLP will sign off.     Aspiration Risk       Diet Recommendation Regular;Thin liquid   Liquid Administration via: Cup;Straw Medication Administration: Whole meds with liquid Supervision: Patient able to self feed Postural Changes: Seated upright at 90 degrees    Other  Recommendations     Follow up Recommendations  None    Frequency and Duration            Prognosis        Swallow Study   General HPI: 82 y.o. female with the  history of very mild nonobstructive CAD by cardiac catheterization in 01/2014, persistent atrial fibrillation with tachy-brady s/p PMM, diastolic CHF, HTN, HL, anemia, and CKD. Pt reprots chest pain, suspected to be esophageal in origin, but esophagram normal.  Type of Study: Bedside Swallow Evaluation Previous Swallow Assessment: Esophagram today - WNL Diet Prior to this Study: Regular;Thin liquids Temperature Spikes Noted: No Respiratory Status: Room air Behavior/Cognition: Alert;Cooperative Oral Cavity Assessment: Within Functional Limits Oral Care Completed by SLP: No Oral Cavity - Dentition: Adequate natural dentition;Missing dentition Vision: Functional for self-feeding Self-Feeding Abilities: Able to feed self Patient Positioning: Upright in bed Baseline Vocal Quality: Normal Volitional Cough: Strong Volitional Swallow: Able to elicit    Oral/Motor/Sensory Function Overall Oral Motor/Sensory Function: Within functional limits   Ice Chips     Thin Liquid Thin Liquid: Within functional limits Presentation: Cup;Self Fed;Straw    Nectar Thick Nectar Thick Liquid: Not tested   Honey Thick Honey Thick Liquid: Not tested   Puree Puree: Within functional limits   Solid   GO   Solid: Within functional limits    Functional Assessment Tool Used: clinical judgement Functional Limitations: Swallowing Swallow Current Status [id number]): 0 percent impaired, limited or restricted Swallow Goal Status [id number]): 0 percent impaired, limited or restricted Swallow Discharge Status [phone]): 0 percent impaired, limited or restricted  Herbie Baltimore, MA CCC-SLP [id number]  Avyn Coate, Katherene Ponto 01/22/2016,12:31 PM

## 2016-01-22 NOTE — Evaluation (Signed)
Physical Therapy Evaluation Patient Details Name: [name] MRN: [id number] DOB: Feb 18, 1925 Today's Date: 01/22/2016   History of Present Illness  81 y.o. female with a past medical history of hyperlipidemia, hypertension, thrombocytopenia, combine systolic and diastolic CHF, paroxysmal atrial fibrillation, GERD, hypothyroidism, monoclonal gammopathy who comes to the emergency department due to having chest pain.  Clinical Impression  Pt appears near baseline level of function. Pt able to perform safe mobility with RW without LOB. Pt may benefit from HHPT to increase strength and progress to being able to use [facility] for mobility.  Pt with no further acute PT needs at this time.    Follow Up Recommendations Home health PT;Supervision - Intermittent    Equipment Recommendations  None recommended by PT    Recommendations for Other Services       Precautions / Restrictions Precautions Precautions: Fall Restrictions Weight Bearing Restrictions: No      Mobility  Bed Mobility Overal bed mobility: Independent                Transfers Overall transfer level: Modified independent Equipment used: Rolling walker (2 wheeled)             General transfer comment: no LOB or assist needed  Ambulation/Gait Ambulation/Gait assistance: Supervision Ambulation Distance (Feet): 120 Feet Assistive device: Rolling walker (2 wheeled)       General Gait Details: pt with no LOB with gait with turns or head turns with RW. Pt encouraged to use RW instead of SPC at all times at home while she is still getting stronger   Financial trader Rankin (Stroke Patients Only)       Balance                                             Pertinent Vitals/Pain Pain Assessment: No/denies pain    Home Living Family/patient expects to be discharged to:: Private residence Living Arrangements: Alone Available Help at Discharge:  Family Type of Home: House Home Access: Stairs to enter Entrance Stairs-Rails: Chemical engineer of Steps: [place]: One [place]: Environmental consultant - 2 wheels;Walker - 4 wheels;Cane - single point Additional Comments: Pt's daughter checks on her frequently during the day.     Prior Function Level of Independence: Independent with assistive device(s)         Comments: used cane     Hand Dominance        Extremity/Trunk Assessment   Upper Extremity Assessment: Generalized weakness           Lower Extremity Assessment: Generalized weakness      Cervical / Trunk Assessment: Kyphotic  Communication   Communication: HOH  Cognition Arousal/Alertness: Awake/alert Behavior During Therapy: WFL for tasks assessed/performed Overall Cognitive Status: Within Functional Limits for tasks assessed                      General Comments General comments (skin integrity, edema, etc.): no LOB noted during session with use of AD    Exercises        Assessment/Plan    PT Assessment All further PT needs can be met in the next venue of care  PT Diagnosis     PT Problem List Decreased strength;Decreased mobility;Decreased activity tolerance;Decreased balance  PT  Treatment Interventions     PT Goals (Current goals can be found in the Care Plan section) Acute Rehab PT Goals PT Goal Formulation: All assessment and education complete, DC therapy    Frequency     Barriers to discharge        Co-evaluation               End of Session Equipment Utilized During Treatment: Gait belt Activity Tolerance: Patient tolerated treatment well Patient left: in bed;with nursing/sitter in room;with call bell/phone within reach Nurse Communication: Mobility status    Functional Assessment Tool Used: clinical judgement Functional Limitation: Mobility: Walking and moving around Mobility: Walking and Moving Around Current Status ([id number]): At least 1  percent but less than 20 percent impaired, limited or restricted Mobility: Walking and Moving Around Goal Status [phone]): At least 1 percent but less than 20 percent impaired, limited or restricted Mobility: Walking and Moving Around Discharge Status [phone]): At least 1 percent but less than 20 percent impaired, limited or restricted    Time: 1410-1425 PT Time Calculation (min) (ACUTE ONLY): 15 min   Charges:   PT Evaluation $PT Eval Low Complexity: 1 Procedure     PT G Codes:   PT G-Codes **NOT FOR INPATIENT CLASS** Functional Assessment Tool Used: clinical judgement Functional Limitation: Mobility: Walking and moving around Mobility: Walking and Moving Around Current Status ([id number]): At least 1 percent but less than 20 percent impaired, limited or restricted Mobility: Walking and Moving Around Goal Status [phone]): At least 1 percent but less than 20 percent impaired, limited or restricted Mobility: Walking and Moving Around Discharge Status [phone]): At least 1 percent but less than 20 percent impaired, limited or restricted    DONAWERTH,KAREN 01/22/2016, 2:30 PM

## 2016-01-23 LAB — CUP PACEART REMOTE DEVICE CHECK
Brady Statistic AP VP Percent: 99 %
Brady Statistic AP VS Percent: 1 %
Brady Statistic RV Percent Paced: 99 %
Date Time Interrogation Session: 20170308090016
Implantable Lead Implant Date: 20150610
Implantable Lead Model: 5076
Lead Channel Impedance Value: 490 Ohm
Lead Channel Pacing Threshold Amplitude: 1.25 V
Lead Channel Setting Pacing Pulse Width: 0.4 ms
Lead Channel Setting Sensing Sensitivity: 2 mV
MDC IDC LEAD IMPLANT DT: 20150610
MDC IDC LEAD LOCATION: 753859
MDC IDC LEAD LOCATION: 753860
MDC IDC MSMT BATTERY REMAINING LONGEVITY: 99 mo
MDC IDC MSMT BATTERY REMAINING PERCENTAGE: 95.5 %
MDC IDC MSMT BATTERY VOLTAGE: 2.99 V
MDC IDC MSMT LEADCHNL RA IMPEDANCE VALUE: 450 Ohm
MDC IDC MSMT LEADCHNL RA PACING THRESHOLD AMPLITUDE: 0.5 V
MDC IDC MSMT LEADCHNL RA PACING THRESHOLD PULSEWIDTH: 0.4 ms
MDC IDC MSMT LEADCHNL RA SENSING INTR AMPL: 5 mV
MDC IDC MSMT LEADCHNL RV PACING THRESHOLD PULSEWIDTH: 0.4 ms
MDC IDC MSMT LEADCHNL RV SENSING INTR AMPL: 12 mV
MDC IDC SET LEADCHNL RA PACING AMPLITUDE: 2 V
MDC IDC SET LEADCHNL RV PACING AMPLITUDE: 2.5 V
MDC IDC STAT BRADY AS VP PERCENT: 1 %
MDC IDC STAT BRADY AS VS PERCENT: 1 %
MDC IDC STAT BRADY RA PERCENT PACED: 99 %
Pulse Gen Model: 2240
Pulse Gen Serial Number: 7627922

## 2016-01-23 NOTE — Progress Notes (Signed)
Normal remote reviewed. +amio and Eliquis  Next Merlin 04/20/16

## 2016-01-27 ENCOUNTER — Encounter: Payer: Self-pay | Admitting: Cardiology

## 2016-01-27 ENCOUNTER — Telehealth: Payer: Self-pay | Admitting: Internal Medicine

## 2016-01-27 NOTE — Telephone Encounter (Signed)
[name] said pt was in hospital for 2 days and would like to know if dr Regis Bill will sign PT orders. Once a wk for today only and then twice a wk for up to 8 weeks. Verbal order ok. [name] is aware md out of office until 02-01-16

## 2016-01-28 ENCOUNTER — Encounter (HOSPITAL_COMMUNITY): Payer: Self-pay | Admitting: Internal Medicine

## 2016-01-28 ENCOUNTER — Ambulatory Visit (HOSPITAL_COMMUNITY)
Admission: RE | Admit: 2016-01-28 | Discharge: 2016-01-28 | Disposition: A | Discharge status: Home / Self Care | Payer: Medicare Other | Source: Ambulatory Visit | Attending: Internal Medicine | Admitting: Internal Medicine

## 2016-01-28 ENCOUNTER — Telehealth: Payer: Self-pay | Admitting: General Practice

## 2016-01-28 VITALS — BP 122/68 | HR 70 | Resp 18 | Wt 129.5 lb

## 2016-01-28 DIAGNOSIS — I130 Hypertensive heart and chronic kidney disease with heart failure and stage 1 through stage 4 chronic kidney disease, or unspecified chronic kidney disease: Secondary | ICD-10-CM | POA: Insufficient documentation

## 2016-01-28 DIAGNOSIS — Z7901 Long term (current) use of anticoagulants: Secondary | ICD-10-CM | POA: Diagnosis not present

## 2016-01-28 DIAGNOSIS — Z950 Presence of cardiac pacemaker: Secondary | ICD-10-CM | POA: Diagnosis not present

## 2016-01-28 DIAGNOSIS — I480 Paroxysmal atrial fibrillation: Secondary | ICD-10-CM | POA: Diagnosis not present

## 2016-01-28 DIAGNOSIS — Z85828 Personal history of other malignant neoplasm of skin: Secondary | ICD-10-CM | POA: Insufficient documentation

## 2016-01-28 DIAGNOSIS — N184 Chronic kidney disease, stage 4 (severe): Secondary | ICD-10-CM | POA: Insufficient documentation

## 2016-01-28 DIAGNOSIS — Z79899 Other long term (current) drug therapy: Secondary | ICD-10-CM | POA: Diagnosis not present

## 2016-01-28 DIAGNOSIS — E785 Hyperlipidemia, unspecified: Secondary | ICD-10-CM | POA: Insufficient documentation

## 2016-01-28 DIAGNOSIS — E039 Hypothyroidism, unspecified: Secondary | ICD-10-CM | POA: Diagnosis not present

## 2016-01-28 DIAGNOSIS — R0789 Other chest pain: Secondary | ICD-10-CM

## 2016-01-28 DIAGNOSIS — R001 Bradycardia, unspecified: Secondary | ICD-10-CM | POA: Diagnosis not present

## 2016-01-28 DIAGNOSIS — E876 Hypokalemia: Secondary | ICD-10-CM | POA: Insufficient documentation

## 2016-01-28 DIAGNOSIS — K219 Gastro-esophageal reflux disease without esophagitis: Secondary | ICD-10-CM | POA: Diagnosis not present

## 2016-01-28 DIAGNOSIS — Z7982 Long term (current) use of aspirin: Secondary | ICD-10-CM | POA: Diagnosis not present

## 2016-01-28 DIAGNOSIS — I5032 Chronic diastolic (congestive) heart failure: Secondary | ICD-10-CM | POA: Insufficient documentation

## 2016-01-28 NOTE — Progress Notes (Signed)
Patient ID: [name], female   DOB: 01-04-1925, 82 y.o.   MRN: [id number]    Advanced Heart Failure Clinic Note   PCP: Dr Netty Starring Primary Cardiologist: Dr. Harrington Challenger Primary HF: Dr. Haroldine Laws   HPI: [name] is a 82 y.o. female with the history of nonobstructive CAD by cardiac catheterization in 01/2014, persistent atrial fibrillation, diastolic CHF, HTN, HL, anemia, and CKD. Patient was admitted 04/2014 x2 with acute on chronic diastolic CHF. She underwent cardioversion with restoration of NSR. This resulted in profound bradycardia and hypotension requiring pacemaker implantation. Pacemaker implant was complicated by acute blood loss anemia requiring transfusion with PRBCs  Admitted to [facility] August 21 with increased dyspnea. Diuresed with IV lasix and transitioned to po lasix 40 mg twice a day. GI evaluated due to anemia and black stool. Had EGD with no acute findings. Colonoscopy was cancelled per daughter due to multiple medical problems. She continued on eliquis 2.5 mg twice a day.  Palliative Care consulted and the patient and family request aggressive care for heart failure. Discharge weight was 152 pounds.   Admitted to [facility] 11/9 after RHC due to elevated filling pressures. Once diuresed she was put on torsemide 20 mg twice a day. She was also restarted on amiodarone 200 mg daily and continue on eliquis 2.5 mg twice a day. Discharge weight was 141 pounds.   Admitted 12/12 to 12/17 with recurrent HF and AF. Admit weight was 136. Was diuresed to 131. Diuresis limited by low BP. Demadex increased from 20 bid to 40 bid.   Admitted 12/06/14 - 12/09/14 for ADHF. Initial weight 134 pounds. Diuresed down to 130 and felt better. Treatment limited due to hypotension and worsening renal failure with creatinine up to 2.6. Renal u/s ok. Metolazone stopped.  Admitted in 3/16 with hypotension. Started on midodrine 2.5 bid. Demadex cut back from 40 bid to 40 daily.   Admitted 5/17 - 5/18 with  recurrent CP. Myoview normal EF > 65% no ischemia.  Admitted 05/09/15 for chest pain. Demedex decreased to 30 mg daily with worsening creatinine. Troponin flat and without trend. D/c weight 133 lb  Admitted 01/21/16 with atypical chest pain. Recent cath, stress test reviewed with her and re-inforced that it is not cardiac related. Ruled out ACS, esophageal dysmotility, normal esophagogram.  Echo 01/22/16 LVEF 60-65%, grade 2 DD  She returns today for HF follow up. Admitted with more atypical chest pain as above. No ACS. GI work up negative as well.  No further symptoms since admission. No DOE around house or grocery store.  Uses a walker or a cane.  No orthopnea. No lightheadedness or dizziness. Weight at home 128-132. Taking all medications as directed. No bleeding on eliquis.  Does have some easy bruising  Studies:  - LHC (3/15): Mid LM 20-30%, mid RCA 30-40%, EF 65%  - Echo (4/15): Mild LVH, EF 60-65%, no RWMA, mild BAE, trivial eff  - Echo 5/15: EF 45-50% septl HK - Carotid US (5/15): 40-59% RICA stenosis. [id number] LICA stenosis.  - 5/16 Myoview EF > 65% normal perfusion - Cath 3/15:   Left mainstem: The mid left main has 20-30% stenosis and there is moderate calcification noted. Left anterior descending (LAD): There is mild diffuse plaquing without significant stenosis. The first diagonal is patent. Left circumflex (LCx): Left circumflex is patent. The first OM is patent without significant stenosis. Right coronary artery (RCA): This is a large, dominant vessel. The vessel is moderately calcified. The mid vessel has 30-40% stenosis.  RHC 09/22/14  RA = 11 RV = 49/6/10 PA = 49/22 (34) PCW = 18 (v = 25) Fick cardiac output/index = 6.4/4.0 PVR = 2.5 WU FA sat = 95% PA sat = 72%,74%  Labs 9/15 K 3.5 Creatinine 1.44 Hgb 10.7  Labs 8/15 K 4.1 Creatinine 1.46  Hgb 9.8  Labs 8/15 TSH 2.7 Labs 10/15: K 4.0, creatinine 1.93 Labs 11/15 K 4.6 Creatinine 3.9  Labs 11/15 K 3.6 Creatinine 1.61  hgb 13.7 Labs 11/15 K 4.4 Creatinine 1.7 Hgb 14/1  Labs 12/15 K 3.9 Creatinine 1.4  Labs 2/16: K 4.2 Creatinine 2.22 Labs 5/16: K 3.5 Creatinine 1.8 Labs 6/16 K 3.1 Creatinine 2.29   ROS: All systems negative except as listed in HPI, PMH and Problem List.  Past Medical History  Diagnosis Date  . Dyslipidemia   . Hypertension   . Palpitations     PVCs/bigeminy on event in May 2009 revealing this was relatively asymptomatic  . Chest pain     a. 2011 Neg MV;  b. 01/2014 Cath: LM 20-30, LAD nl, D1 nl, LCX nl, OM1 nl, RCA dom 30-32m, PD/PL nl, EF 65%->Med Rx.  . Degenerative joint disease   . Hx of varicella   . History of skin cancer     tafeen/ non melanoma.   . Thrombocytopenia ([place])   . Anemia   . Monoclonal gammopathies   . Right lower quadrant abdominal pain 07/19/2012    Recurrent  With nausea    This is new since she had ct for llq pain in [facility]    R/o appendiceal problem  Hernia  Get ct scan  And plan  Fu     . Diastolic CHF, acute on chronic ([place]) 01/31/2012  . Atrial fibrillation ([place])     a. Dx 01/2014->Eliquis started.  . Cancer ([place])   . Pacemaker   . Dysrhythmia   . Family history of adverse reaction to anesthesia     " multiple family members have difficulty waking "  . GERD (gastroesophageal reflux disease)   . Hypothyroidism     Current Outpatient Prescriptions  Medication Sig Dispense Refill  . acetaminophen (TYLENOL) 325 MG tablet Take 2 tablets (650 mg total) by mouth every 4 (four) hours as needed for headache or mild pain.    Marland Kitchen amiodarone (PACERONE) 200 MG tablet TAKE ONE TABLET BY MOUTH ONCE DAILY 30 tablet 5  . apixaban (ELIQUIS) 2.5 MG TABS tablet Take 1 tablet (2.5 mg total) by mouth 2 (two) times daily. 60 tablet 6  . aspirin 81 MG chewable tablet Chew 324 mg by mouth once. Reported on 01/20/2016    . ferrous sulfate 325 (65 FE) MG tablet TAKE ONE TABLET BY MOUTH TWICE DAILY WITH A MEAL 180 tablet 0  . levothyroxine (SYNTHROID, LEVOTHROID) 75 MCG tablet  TAKE ONE TABLET BY MOUTH ONCE DAILY 90 tablet 0  . loratadine (CLARITIN) 10 MG tablet Take 10 mg by mouth at bedtime as needed for allergies or rhinitis.     Marland Kitchen metolazone (ZAROXOLYN) 2.5 MG tablet Take 1 tablet (2.5 mg total) by mouth as needed (for weight 136 lb or greater). 10 tablet 3  . nitroGLYCERIN (NITROSTAT) 0.4 MG SL tablet Place 1 tablet (0.4 mg total) under the tongue every 5 (five) minutes as needed. For chest pain 25 tablet 6  . oxyCODONE-acetaminophen (PERCOCET/ROXICET) 5-325 MG per tablet Take 0.5-1 tablets by mouth every 6 (six) hours as needed for severe pain. 5 tablet 0  . pantoprazole (PROTONIX)  40 MG tablet TAKE ONE TABLET BY MOUTH ONCE DAILY 30 tablet 5  . potassium chloride SA (K-DUR,KLOR-CON) 20 MEQ tablet Take 60 mEq by mouth daily.    . simvastatin (ZOCOR) 10 MG tablet TAKE ONE TABLET BY MOUTH AT BEDTIME 30 tablet 3  . torsemide (DEMADEX) 20 MG tablet Take 2 tablets (40 mg total) by mouth daily. 60 tablet 6   No current facility-administered medications for this encounter.   Filed Vitals:   01/28/16 1400  BP: 122/68  Pulse: 70  Resp: 18  Weight: 129 lb 8 oz (58.741 kg)  SpO2: 97%    Wt Readings from Last 3 Encounters:  01/28/16 129 lb 8 oz (58.741 kg)  01/22/16 133 lb 11.2 oz (60.646 kg)  10/21/15 124 lb (56.246 kg)     PHYSICAL EXAM: General:  Elderly, frail appearing. No resp difficulty. Caregiver present. Ambulated into clinic using cane without difficulty.   HEENT: Normal Neck: supple. JVP flat. Carotids 2+ bilaterally; no bruits. No thyromegaly or nodule noted. Cor: PMI normal. RRR. No rubs, gallops. 1/6 diastolic murmur.  Lungs: CTAB, normal effort Abdomen: soft, NT, ND, no HSM. No bruits or masses. +BS  Extremities: no cyanosis, clubbing, rash, R and LLE severe varicose veins. No edema noted. Neuro: alert & orientedx3, cranial nerves grossly intact. Moves all 4 extremities w/o difficulty. Affect pleasant.   ASSESSMENT & PLAN:  1. Chronic  Diastolic Heart Failure - She has a very narrow euvolemic window. NYHA II-III.  - Volume status stable.  Continue 40 mg torsemide daily.   - Continue to take metolazone only as needed for weight 136 or greater.  - Reinforced the need and importance of daily weights, a low sodium diet, and fluid restriction (less than 2 L a day). Instructed to call the HF clinic if weight increases more than 3 lbs overnight or 5 lbs in a week.  2.PAF - symptomatic bradycardia s/p PPM.   - Previously thought to have failed amiodarone and had chronic AF. However in November in the hospital she was in NSR so amio restarted.  - She remains in NSR by exam today. EKG during recent admission also showed NSR.  - Continue amiodarone 200 mg daily. Continue Eliquis 2.5 mg twice a day. No bleeding problems.    3. CKD stage IV - Stable on recent hospital labs.  4. Deconditioning- Continue to increase activity as able.  5. Hypokalemia - Stable during recent admission. Continue kcl 6. Chest pain, atypical  - resolved. Stress test normal 5/16. Minimal CAD on cath 3/15 - had recent full GI work up as well, normal.  - Recommend she follow up with PCP for possible GI referral if she continues to have symptoms.  States she had an EGD many years ago. Likely does not need anything further.   Follow up in 4 months.   Satira Mccallum Mylinh Cragg PA-C  2:14 PM

## 2016-01-28 NOTE — Patient Instructions (Signed)
Doing great!  Follow up 4 months.  Do the following things EVERYDAY: 1) Weigh yourself in the morning before breakfast. Write it down and keep it in a log. 2) Take your medicines as prescribed 3) Eat low salt foods-Limit salt (sodium) to 2000 mg per day.  4) Stay as active as you can everyday 5) Limit all fluids for the day to less than 2 liters

## 2016-01-31 NOTE — Telephone Encounter (Signed)
Declined as stated  I have not seen her  since November 2016    The hosp admission was for cardiac problem . And she  was appropriately FU in the CHF clinic last week  Request   cardiology to  order PT .  I f they cannot   Then she will require a post hosp visit wth med to evaluate for PT home health etc.

## 2016-02-01 NOTE — Telephone Encounter (Signed)
Tried reaching [facility].  Received a message that the telephone number is no longer in service.  Will try again at a later time.

## 2016-02-02 ENCOUNTER — Telehealth: Payer: Self-pay | Admitting: Internal Medicine

## 2016-02-02 NOTE — Telephone Encounter (Signed)
Left a message informing [name] that any orders should come from cardiology.

## 2016-02-02 NOTE — Telephone Encounter (Signed)
Joanne/ [facility] did her eval yesterday on pt from a brief hospital stay, ordered from hospitalist. Pt admitted for chest pains that ended up being non cardiac.  [name] needs verbal to continue to see pt for PT and home health. Please advise if Dr Regis Bill will do. thanks

## 2016-02-04 ENCOUNTER — Telehealth: Payer: Self-pay | Admitting: Internal Medicine

## 2016-02-04 NOTE — Telephone Encounter (Signed)
Patient had hosp fu with cardiology but    We can do 2 slot  Ov  either tues 3 28 345/4pm sdas   Or  Use doc of the day 2 slots in afternoon mon 27th

## 2016-02-04 NOTE — Telephone Encounter (Signed)
Pt needs hospital follow up appointment. Also to discuss home health. Pt states she ws supposed to have already been after her hospital stay, but she did not. Now wants appointment as soon as possible. Please advise on when to schedule.

## 2016-02-04 NOTE — Telephone Encounter (Signed)
Pt scheduled  

## 2016-02-04 NOTE — Telephone Encounter (Signed)
Left message for pt to call back  °

## 2016-02-09 ENCOUNTER — Encounter: Payer: Medicare Other | Admitting: Internal Medicine

## 2016-02-09 NOTE — Progress Notes (Signed)
Document opened and reviewed for OV  Post hosp visit but appt  canceled same day .

## 2016-02-10 ENCOUNTER — Encounter: Payer: Self-pay | Admitting: Cardiology

## 2016-02-10 ENCOUNTER — Telehealth: Payer: Self-pay | Admitting: Internal Medicine

## 2016-02-10 NOTE — Telephone Encounter (Signed)
Please send  To cardiology who saw her post hospital  I cannot do an order as I havent  seen her since the fall.  She has fu appt   Next week.

## 2016-02-10 NOTE — Telephone Encounter (Signed)
Did you send this to cardiology?

## 2016-02-10 NOTE — Telephone Encounter (Addendum)
[name] would like to know if we received home health order for this pt to have physical therapy etc. Pt has an appt on 02-15-16

## 2016-02-11 ENCOUNTER — Telehealth (HOSPITAL_COMMUNITY): Payer: Self-pay

## 2016-02-11 NOTE — Telephone Encounter (Signed)
Elberta Fortis with [facility] [employer] called to report patient's Torsemide has been approved.  [name]

## 2016-02-11 NOTE — Telephone Encounter (Signed)
Spoke to [employer] and advised that the orders will need to come from cardiology.  She will call cardiology office.

## 2016-02-15 ENCOUNTER — Encounter: Payer: Self-pay | Admitting: Internal Medicine

## 2016-02-15 ENCOUNTER — Ambulatory Visit (INDEPENDENT_AMBULATORY_CARE_PROVIDER_SITE_OTHER): Payer: Medicare Other | Admitting: Internal Medicine

## 2016-02-15 VITALS — BP 132/68 | Temp 98.3°F | Ht <= 58 in | Wt 134.0 lb

## 2016-02-15 DIAGNOSIS — Z7901 Long term (current) use of anticoagulants: Secondary | ICD-10-CM

## 2016-02-15 DIAGNOSIS — R0789 Other chest pain: Secondary | ICD-10-CM

## 2016-02-15 DIAGNOSIS — R54 Age-related physical debility: Secondary | ICD-10-CM | POA: Diagnosis not present

## 2016-02-15 DIAGNOSIS — I5032 Chronic diastolic (congestive) heart failure: Secondary | ICD-10-CM

## 2016-02-15 DIAGNOSIS — Z789 Other specified health status: Secondary | ICD-10-CM

## 2016-02-15 DIAGNOSIS — N183 Chronic kidney disease, stage 3 unspecified: Secondary | ICD-10-CM

## 2016-02-15 DIAGNOSIS — E038 Other specified hypothyroidism: Secondary | ICD-10-CM | POA: Diagnosis not present

## 2016-02-15 DIAGNOSIS — L819 Disorder of pigmentation, unspecified: Secondary | ICD-10-CM

## 2016-02-15 DIAGNOSIS — IMO0001 Reserved for inherently not codable concepts without codable children: Secondary | ICD-10-CM

## 2016-02-15 DIAGNOSIS — Z87898 Personal history of other specified conditions: Secondary | ICD-10-CM

## 2016-02-15 DIAGNOSIS — I482 Chronic atrial fibrillation, unspecified: Secondary | ICD-10-CM

## 2016-02-15 DIAGNOSIS — K068 Other specified disorders of gingiva and edentulous alveolar ridge: Secondary | ICD-10-CM

## 2016-02-15 DIAGNOSIS — D472 Monoclonal gammopathy: Secondary | ICD-10-CM

## 2016-02-15 LAB — BASIC METABOLIC PANEL
BUN: 41 mg/dL — ABNORMAL HIGH (ref 6–23)
CO2: 33 mEq/L — ABNORMAL HIGH (ref 19–32)
Calcium: 10.1 mg/dL (ref 8.4–10.5)
Chloride: 97 mEq/L (ref 96–112)
Creatinine, Ser: 2.18 mg/dL — ABNORMAL HIGH (ref 0.40–1.20)
GFR: 22.58 mL/min — AB (ref >60.00)
Glucose, Bld: 95 mg/dL (ref 70–99)
POTASSIUM: 4 meq/L (ref 3.5–5.1)
SODIUM: 141 meq/L (ref 135–145)

## 2016-02-15 LAB — TSH: TSH: 2.35 u[IU]/mL (ref 0.35–4.50)

## 2016-02-15 MED ORDER — POTASSIUM CHLORIDE CRYS ER 20 MEQ PO TBCR: 60.0000 meq | EXTENDED_RELEASE_TABLET | Freq: Every day | ORAL | Status: DC

## 2016-02-15 NOTE — Patient Instructions (Addendum)
  Will notify you  of labs when available.    Checking  Kidney function and  Potassium  Etc.  Also thyroid  . If ok then stay on same med and  Continue Physical or  Home health .   Ton gain endurance .  Will do refill of the potassium in the short  Run  .   If all ok then recheck in 3 months  Or  If  Problems recur .
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
## 2016-02-17 ENCOUNTER — Telehealth (HOSPITAL_COMMUNITY): Payer: Self-pay | Admitting: Pharmacist

## 2016-02-17 ENCOUNTER — Telehealth: Payer: Self-pay | Admitting: Internal Medicine

## 2016-02-17 LAB — IMMUNOFIXATION ELECTROPHORESIS
IGA: 322 mg/dL (ref 69–380)
IGG (IMMUNOGLOBIN G), SERUM: 1110 mg/dL (ref 690–1700)
IgM, Serum: 320 mg/dL (ref 52–322)

## 2016-02-17 NOTE — Telephone Encounter (Signed)
[name] from [place] call to ask for a call back concerning home health  [phone]

## 2016-02-17 NOTE — Telephone Encounter (Signed)
Should orders continue to go to cardiology?

## 2016-02-17 NOTE — Telephone Encounter (Signed)
Spoke to [employer] and advised her to proceed with orders for PT.  She will fax over orders.

## 2016-02-17 NOTE — Telephone Encounter (Signed)
Torsemide 20 mg PA approved by [facility] Part D through 02/09/17.   Ruta Hinds. Velva Harman, PharmD, BCPS, CPP Clinical Pharmacist Pager: [phone] Phone: [phone] 02/17/2016 11:30 AM

## 2016-02-17 NOTE — Telephone Encounter (Signed)
Ok to refer to home health since I have not seen her in the office .   BUT tell them to send all information related to chf status to cardiology  Dr Haroldine Laws CHF clinic

## 2016-02-19 ENCOUNTER — Telehealth: Payer: Self-pay | Admitting: Internal Medicine

## 2016-02-19 NOTE — Telephone Encounter (Signed)
Pt call for lab results from 4/3

## 2016-02-22 NOTE — Progress Notes (Signed)
Chief Complaint  Patient presents with  . Dysuria    HPI: [name] 82 y.o.  Comes in for sda appt  New problem  Dysuria off and    On  In past   .    More recently began persistent for the last 2-3 days some frequency  Discomfort .  Non fever chills   New flank pain  CHF seems stable an no new   Med  Continued problem with back  ? If lighter brace would help  Pt and ot to come to house ROS: See pertinent positives and negatives per HPI.  Past Medical History  Diagnosis Date  . Dyslipidemia   . Hypertension   . Palpitations     PVCs/bigeminy on event in May 2009 revealing this was relatively asymptomatic  . Chest pain     a. 2011 Neg MV;  b. 01/2014 Cath: LM 20-30, LAD nl, D1 nl, LCX nl, OM1 nl, RCA dom 30-64m, PD/PL nl, EF 65%->Med Rx.  . Degenerative joint disease   . Hx of varicella   . History of skin cancer     tafeen/ non melanoma.   . Thrombocytopenia ([place])   . Anemia   . Monoclonal gammopathies   . Right lower quadrant abdominal pain 07/19/2012    Recurrent  With nausea    This is new since she had ct for llq pain in [facility]    R/o appendiceal problem  Hernia  Get ct scan  And plan  Fu     . Diastolic CHF, acute on chronic ([place]) 01/31/2012  . Atrial fibrillation ([place])     a. Dx 01/2014->Eliquis started.  . Cancer ([place])   . Pacemaker   . Dysrhythmia   . Family history of adverse reaction to anesthesia     " multiple family members have difficulty waking "  . GERD (gastroesophageal reflux disease)   . Hypothyroidism     Family History  Problem Relation Age of Onset  . Coronary artery disease Father   . Heart disease Father   . Lung cancer    . Alzheimer's disease Mother   . Cancer Brother 33    lung cancer  . Heart attack Father     Social History   Social History  . Marital Status: Widowed    Spouse Name: N/A  . Number of Children: 3  . Years of Education: N/A   Occupational History  .      [employer], book keeping   Social History  Main Topics  . Smoking status: Never Smoker   . Smokeless tobacco: Never Used  . Alcohol Use: No  . Drug Use: No  . Sexual Activity: No   Other Topics Concern  . 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 Eats with her.      Daughter handles medications. Sees HH and PT once a week.    Outpatient Prescriptions Prior to Visit  Medication Sig Dispense Refill  . acetaminophen (TYLENOL) 325 MG tablet Take 2 tablets (650 mg total) by mouth every 4 (four) hours as needed for headache or mild pain.    Marland Kitchen amiodarone (PACERONE) 200  MG tablet TAKE ONE TABLET BY MOUTH ONCE DAILY 30 tablet 5  . apixaban (ELIQUIS) 2.5 MG TABS tablet Take 1 tablet (2.5 mg total) by mouth 2 (two) times daily. 60 tablet 6  . ferrous sulfate 325 (65 FE) MG tablet TAKE ONE TABLET BY MOUTH TWICE DAILY WITH A MEAL 180 tablet 0  . levothyroxine (SYNTHROID, LEVOTHROID) 75 MCG tablet TAKE ONE TABLET BY MOUTH ONCE DAILY 90 tablet 0  . loratadine (CLARITIN) 10 MG tablet Take 10 mg by mouth at bedtime as needed for allergies or rhinitis.     Marland Kitchen metolazone (ZAROXOLYN) 2.5 MG tablet Take 1 tablet (2.5 mg total) by mouth as needed (for weight 136 lb or greater). 10 tablet 3  . nitroGLYCERIN (NITROSTAT) 0.4 MG SL tablet Place 1 tablet (0.4 mg total) under the tongue every 5 (five) minutes as needed. For chest pain 25 tablet 6  . oxyCODONE-acetaminophen (PERCOCET/ROXICET) 5-325 MG per tablet Take 0.5-1 tablets by mouth every 6 (six) hours as needed for severe pain. 5 tablet 0  . pantoprazole (PROTONIX) 40 MG tablet TAKE ONE TABLET BY MOUTH ONCE DAILY 30 tablet 5  . potassium chloride SA (K-DUR,KLOR-CON) 20 MEQ tablet Take 3 tablets (60 mEq total) by mouth daily. 90 tablet 2  . simvastatin (ZOCOR) 10 MG tablet TAKE  ONE TABLET BY MOUTH AT BEDTIME 30 tablet 3  . torsemide (DEMADEX) 20 MG tablet Take 2 tablets (40 mg total) by mouth daily. 60 tablet 6   No facility-administered medications prior to visit.     EXAM:  BP 120/60 mmHg  Pulse 71  Temp(Src) 98.4 F (36.9 C) (Oral)  Wt 130 lb (58.968 kg)  Body mass index is 27.18 kg/(m^2).  GENERAL: vitals reviewed and listed above, alert, oriented, appears well hydrated and in no acute distress HEENT: atraumatic, conjunctiva  clear, no obvious abnormalities on inspection of external nose and ears NECK: no obvious masses on inspection palpation  LUNGS: clear to auscultation bilaterally, no wheezes, rales or rhonchi,   Kyphosis   CV: HRRR, no clubbing cyanosis or  nl cap refill  MS: moves all extremities  Walks with walker  PSYCH: pleasant and cooperative, no obvious depression or anxiety nl speech  Wt Readings from Last 3 Encounters:  02/23/16 130 lb (58.968 kg)  02/15/16 134 lb (60.782 kg)  01/28/16 129 lb 8 oz (58.741 kg)   UA trc leuk  ASSESSMENT AND PLAN:  Discussed the following assessment and plan:  Dysuria - Plan: POCT Urinalysis Dipstick (Automated), Urine culture  Age factor  Medically complex patient  Chronic anticoagulation  Chronic diastolic heart failure (HCC) - stable  Check for infection   Vs other   Sent in antibiotic if needed  Order culture  In past has had similar sx with ng and hx of external dysuria    -Patient advised to return or notify health care team  if symptoms worsen ,persist or new concerns arise.  Patient Instructions  Will le  You know culture results when back . There is a small amount of inflammation cells in the urine .  Could be non specific irritation vs urine infection.         Standley Brooking. [name] M.D.

## 2016-02-22 NOTE — Telephone Encounter (Signed)
Pt notified of results and has made a future lab appt.  Also complained of urinary frequency and dysuria while on the phone.  Acute appt scheduled for 02/23/16.

## 2016-02-22 NOTE — Telephone Encounter (Signed)
See result note reveiwed   Renal function somewhat worse again but potassium is just right . Thyroid is normal   I advise repeat  BMP in  4-6 weeks   To ensure stabiilty of renal function  Please arrange for this

## 2016-02-23 ENCOUNTER — Encounter: Payer: Self-pay | Admitting: Internal Medicine

## 2016-02-23 ENCOUNTER — Ambulatory Visit (INDEPENDENT_AMBULATORY_CARE_PROVIDER_SITE_OTHER): Payer: Medicare Other | Admitting: Internal Medicine

## 2016-02-23 ENCOUNTER — Telehealth: Payer: Self-pay | Admitting: Internal Medicine

## 2016-02-23 VITALS — BP 120/60 | HR 71 | Temp 98.4°F | Wt 130.0 lb

## 2016-02-23 DIAGNOSIS — I5032 Chronic diastolic (congestive) heart failure: Secondary | ICD-10-CM

## 2016-02-23 DIAGNOSIS — R54 Age-related physical debility: Secondary | ICD-10-CM | POA: Diagnosis not present

## 2016-02-23 DIAGNOSIS — R300 Dysuria: Secondary | ICD-10-CM | POA: Diagnosis not present

## 2016-02-23 DIAGNOSIS — Z789 Other specified health status: Secondary | ICD-10-CM

## 2016-02-23 DIAGNOSIS — IMO0001 Reserved for inherently not codable concepts without codable children: Secondary | ICD-10-CM

## 2016-02-23 DIAGNOSIS — Z7901 Long term (current) use of anticoagulants: Secondary | ICD-10-CM

## 2016-02-23 DIAGNOSIS — Z87898 Personal history of other specified conditions: Secondary | ICD-10-CM | POA: Diagnosis not present

## 2016-02-23 LAB — POC URINALSYSI DIPSTICK (AUTOMATED)
BILIRUBIN UA: NEGATIVE
Blood, UA: NEGATIVE
Glucose, UA: NEGATIVE
Ketones, UA: NEGATIVE
NITRITE UA: NEGATIVE
PH UA: 6.5
PROTEIN UA: NEGATIVE
Spec Grav, UA: 1.015
Urobilinogen, UA: 0.2

## 2016-02-23 MED ORDER — CEPHALEXIN 250 MG PO CAPS: 250.0000 mg | ORAL_CAPSULE | Freq: Three times a day (TID) | ORAL | Status: DC

## 2016-02-23 NOTE — Telephone Encounter (Signed)
Tried X 2 to reach [place].  Receive no dial tone.  Will try again at a different time

## 2016-02-23 NOTE — Telephone Encounter (Signed)
Ok to do this 

## 2016-02-23 NOTE — Progress Notes (Signed)
Pre visit review using our clinic review tool, if applicable. No additional management support is needed unless otherwise documented below in the visit note. 

## 2016-02-23 NOTE — Patient Instructions (Addendum)
Will le  You know culture results when back . There is a small amount of inflammation cells in the urine .  Could be non specific irritation vs urine infection.

## 2016-02-23 NOTE — Telephone Encounter (Signed)
[place] with [place] requesting home health verbal orders for OT 2 x /week for 6 wks

## 2016-02-24 ENCOUNTER — Telehealth: Payer: Self-pay | Admitting: Internal Medicine

## 2016-02-24 NOTE — Telephone Encounter (Signed)
Spoke with [place].  Advised to proceed with OT 2 X wk for 6 wks.

## 2016-02-24 NOTE — Telephone Encounter (Signed)
Pt would like a call back about her lab results

## 2016-02-25 LAB — URINE CULTURE
Colony Count: NO GROWTH
Organism ID, Bacteria: NO GROWTH

## 2016-02-25 NOTE — Telephone Encounter (Signed)
Spoke to the pt.  She wanted to know about her urine culture.  Advised that it showed no growth.

## 2016-02-26 ENCOUNTER — Other Ambulatory Visit (HOSPITAL_COMMUNITY): Payer: Self-pay | Admitting: Internal Medicine

## 2016-02-26 ENCOUNTER — Other Ambulatory Visit: Payer: Self-pay | Admitting: Internal Medicine

## 2016-02-29 DIAGNOSIS — I5023 Acute on chronic systolic (congestive) heart failure: Secondary | ICD-10-CM | POA: Diagnosis not present

## 2016-02-29 DIAGNOSIS — M6281 Muscle weakness (generalized): Secondary | ICD-10-CM | POA: Diagnosis not present

## 2016-02-29 DIAGNOSIS — N184 Chronic kidney disease, stage 4 (severe): Secondary | ICD-10-CM

## 2016-02-29 DIAGNOSIS — I130 Hypertensive heart and chronic kidney disease with heart failure and stage 1 through stage 4 chronic kidney disease, or unspecified chronic kidney disease: Secondary | ICD-10-CM | POA: Diagnosis not present

## 2016-02-29 DIAGNOSIS — R2681 Unsteadiness on feet: Secondary | ICD-10-CM | POA: Diagnosis not present

## 2016-02-29 DIAGNOSIS — I5030 Unspecified diastolic (congestive) heart failure: Secondary | ICD-10-CM

## 2016-02-29 NOTE — Telephone Encounter (Signed)
Sent to the pharmacy by e-scribe. 

## 2016-03-02 ENCOUNTER — Telehealth: Payer: Self-pay | Admitting: Internal Medicine

## 2016-03-02 NOTE — Telephone Encounter (Signed)
i would like her to see urologist    And we can repeat urine in the interim  But she has no growth last time

## 2016-03-02 NOTE — Telephone Encounter (Signed)
Pt is concerned about still frequently urinating goes every 10-15 mins. Would like to know what she need to do.

## 2016-03-02 NOTE — Telephone Encounter (Signed)
Re test urine?

## 2016-03-03 ENCOUNTER — Other Ambulatory Visit: Payer: Self-pay | Admitting: Family Medicine

## 2016-03-03 ENCOUNTER — Other Ambulatory Visit (INDEPENDENT_AMBULATORY_CARE_PROVIDER_SITE_OTHER): Payer: Medicare Other

## 2016-03-03 DIAGNOSIS — R350 Frequency of micturition: Secondary | ICD-10-CM

## 2016-03-03 LAB — POC URINALSYSI DIPSTICK (AUTOMATED)
Bilirubin, UA: NEGATIVE
Blood, UA: NEGATIVE
Glucose, UA: NEGATIVE
Ketones, UA: NEGATIVE
LEUKOCYTES UA: NEGATIVE
Nitrite, UA: NEGATIVE
PROTEIN UA: NEGATIVE
SPEC GRAV UA: 1.015
UROBILINOGEN UA: 0.2
pH, UA: 7

## 2016-03-03 NOTE — Telephone Encounter (Signed)
Spoke to the pt.  She will come in today for urine screen. Advised her to see the urologist.

## 2016-03-04 LAB — URINE CULTURE

## 2016-03-06 ENCOUNTER — Encounter (HOSPITAL_COMMUNITY): Payer: Self-pay | Admitting: *Deleted

## 2016-03-06 ENCOUNTER — Emergency Department (HOSPITAL_COMMUNITY)
Admission: EM | Admit: 2016-03-06 | Discharge: 2016-03-06 | Disposition: A | Discharge status: Home / Self Care | Payer: Medicare Other | Attending: Emergency Medicine | Admitting: Emergency Medicine

## 2016-03-06 ENCOUNTER — Emergency Department (HOSPITAL_COMMUNITY): Payer: Medicare Other

## 2016-03-06 DIAGNOSIS — Z7901 Long term (current) use of anticoagulants: Secondary | ICD-10-CM | POA: Diagnosis not present

## 2016-03-06 DIAGNOSIS — E039 Hypothyroidism, unspecified: Secondary | ICD-10-CM | POA: Insufficient documentation

## 2016-03-06 DIAGNOSIS — Z8619 Personal history of other infectious and parasitic diseases: Secondary | ICD-10-CM | POA: Diagnosis not present

## 2016-03-06 DIAGNOSIS — I4891 Unspecified atrial fibrillation: Secondary | ICD-10-CM | POA: Diagnosis not present

## 2016-03-06 DIAGNOSIS — Z950 Presence of cardiac pacemaker: Secondary | ICD-10-CM | POA: Diagnosis not present

## 2016-03-06 DIAGNOSIS — Z85828 Personal history of other malignant neoplasm of skin: Secondary | ICD-10-CM | POA: Insufficient documentation

## 2016-03-06 DIAGNOSIS — D649 Anemia, unspecified: Secondary | ICD-10-CM | POA: Diagnosis not present

## 2016-03-06 DIAGNOSIS — K219 Gastro-esophageal reflux disease without esophagitis: Secondary | ICD-10-CM | POA: Diagnosis not present

## 2016-03-06 DIAGNOSIS — I10 Essential (primary) hypertension: Secondary | ICD-10-CM | POA: Insufficient documentation

## 2016-03-06 DIAGNOSIS — Z9889 Other specified postprocedural states: Secondary | ICD-10-CM | POA: Diagnosis not present

## 2016-03-06 DIAGNOSIS — E785 Hyperlipidemia, unspecified: Secondary | ICD-10-CM | POA: Insufficient documentation

## 2016-03-06 DIAGNOSIS — Z792 Long term (current) use of antibiotics: Secondary | ICD-10-CM | POA: Insufficient documentation

## 2016-03-06 DIAGNOSIS — R079 Chest pain, unspecified: Secondary | ICD-10-CM | POA: Diagnosis present

## 2016-03-06 DIAGNOSIS — Z79899 Other long term (current) drug therapy: Secondary | ICD-10-CM | POA: Insufficient documentation

## 2016-03-06 DIAGNOSIS — I509 Heart failure, unspecified: Secondary | ICD-10-CM | POA: Insufficient documentation

## 2016-03-06 LAB — CBC
HEMATOCRIT: 42.3 % (ref 36.0–46.0)
HEMOGLOBIN: 13.4 g/dL (ref 12.0–15.0)
MCH: 30.2 pg (ref 26.0–34.0)
MCHC: 31.7 g/dL (ref 30.0–36.0)
MCV: 95.3 fL (ref 78.0–100.0)
Platelets: 125 10*3/uL — ABNORMAL LOW (ref 150–400)
RBC: 4.44 MIL/uL (ref 3.87–5.11)
RDW: 14.9 % (ref 11.5–15.5)
WBC: 4.8 10*3/uL (ref 4.0–10.5)

## 2016-03-06 LAB — BASIC METABOLIC PANEL
Anion gap: 11 (ref 5–15)
BUN: 31 mg/dL — ABNORMAL HIGH (ref 6–20)
CHLORIDE: 109 mmol/L (ref 101–111)
CO2: 24 mmol/L (ref 22–32)
Calcium: 9.5 mg/dL (ref 8.9–10.3)
Creatinine, Ser: 1.9 mg/dL — ABNORMAL HIGH (ref 0.44–1.00)
GFR calc non Af Amer: 22 mL/min — ABNORMAL LOW (ref >60)
GFR, EST AFRICAN AMERICAN: 26 mL/min — AB (ref >60)
Glucose, Bld: 112 mg/dL — ABNORMAL HIGH (ref 65–99)
POTASSIUM: 4.7 mmol/L (ref 3.5–5.1)
SODIUM: 144 mmol/L (ref 135–145)

## 2016-03-06 LAB — BRAIN NATRIURETIC PEPTIDE: B NATRIURETIC PEPTIDE 5: 420.1 pg/mL — AB (ref 0.0–100.0)

## 2016-03-06 LAB — I-STAT TROPONIN, ED: Troponin i, poc: 0.02 ng/mL (ref 0.00–0.08)

## 2016-03-06 MED ORDER — FUROSEMIDE 10 MG/ML IJ SOLN
20.0000 mg | Freq: Once | INTRAMUSCULAR | Status: AC
  Administered 2016-03-06: 20 mg via INTRAVENOUS
  Filled 2016-03-06: qty 2

## 2016-03-06 NOTE — Discharge Instructions (Signed)
Increase morning Demadex to 60mg  tomorrow only. Return to ER, or follow-up with your physician tomorrow if any continued symptoms.   Heart Failure Heart failure means your heart has trouble pumping blood. This makes it hard for your body to work well. Heart failure is usually a long-term (chronic) condition. You must take good care of yourself and follow your doctor's treatment plan. HOME CARE  Take your heart medicine as told by your doctor.  Do not stop taking medicine unless your doctor tells you to.  Do not skip any dose of medicine.  Refill your medicines before they run out.  Take other medicines only as told by your doctor or pharmacist.  Stay active if told by your doctor. The elderly and people with severe heart failure should talk with a doctor about physical activity.  Eat heart-healthy foods. Choose foods that are without trans fat and are low in saturated fat, cholesterol, and salt (sodium). This includes fresh or frozen fruits and vegetables, fish, lean meats, fat-free or low-fat dairy foods, whole grains, and high-fiber foods. Lentils and dried peas and beans (legumes) are also good choices.  Limit salt if told by your doctor.  Cook in a healthy way. Roast, grill, broil, bake, poach, steam, or stir-fry foods.  Limit fluids as told by your doctor.  Weigh yourself every morning. Do this after you pee (urinate) and before you eat breakfast. Write down your weight to give to your doctor.  Take your blood pressure and write it down if your doctor tells you to.  Ask your doctor how to check your pulse. Check your pulse as told.  Lose weight if told by your doctor.  Stop smoking or chewing tobacco. Do not use gum or patches that help you quit without your doctor's approval.  Schedule and go to doctor visits as told.  Nonpregnant women should have no more than 1 drink a day. Men should have no more than 2 drinks a day. Talk to your doctor about drinking alcohol.  Stop  illegal drug use.  Stay current with shots (immunizations).  Manage your health conditions as told by your doctor.  Learn to manage your stress.  Rest when you are tired.  If it is really hot outside:  Avoid intense activities.  Use air conditioning or fans, or get in a cooler place.  Avoid caffeine and alcohol.  Wear loose-fitting, lightweight, and light-colored clothing.  If it is really cold outside:  Avoid intense activities.  Layer your clothing.  Wear mittens or gloves, a hat, and a scarf when going outside.  Avoid alcohol.  Learn about heart failure and get support as needed.  Get help to maintain or improve your quality of life and your ability to care for yourself as needed. GET HELP IF:   You gain weight quickly.  You are more short of breath than usual.  You cannot do your normal activities.  You tire easily.  You cough more than normal, especially with activity.  You have any or more puffiness (swelling) in areas such as your hands, feet, ankles, or belly (abdomen).  You cannot sleep because it is hard to breathe.  You feel like your heart is beating fast (palpitations).  You get dizzy or light-headed when you stand up. GET HELP RIGHT AWAY IF:   You have trouble breathing.  There is a change in mental status, such as becoming less alert or not being able to focus.  You have chest pain or discomfort.  You  faint. MAKE SURE YOU:   Understand these instructions.  Will watch your condition.  Will get help right away if you are not doing well or get worse.   This information is not intended to replace advice given to you by your health care provider. Make sure you discuss any questions you have with your health care provider.   Document Released: 08/09/2008 Document Revised: 11/21/2014 Document Reviewed: 12/17/2012 Elsevier Interactive Patient Education [employer].

## 2016-03-06 NOTE — ED Notes (Signed)
Pt. Verbalizes understanding of d/c instructions and follow-up care.  Pt. displays no s/s of distress at this time and voices no concerns as of now. VS stable. Pt. Out of the unit by wheelchair with daughter at the side. Marland Kitchen

## 2016-03-06 NOTE — ED Notes (Signed)
Spoke with main lab, they will run BNP as well.

## 2016-03-06 NOTE — ED Notes (Signed)
MD at bedside. 

## 2016-03-06 NOTE — ED Notes (Signed)
Patient reports feeling "just a little lightheaded after laying back in bed after ambulating to restroom." MD made aware.
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
## 2016-03-06 NOTE — ED Notes (Signed)
Pt arrives via EMS from home c/o shortness of breath and chest pain, worse when laying down. Pt reports a history of CHF and states that her symptoms are similar to an episode before.

## 2016-03-06 NOTE — ED Notes (Signed)
Pt. Reports a pinching chest pain, MD made aware and requested another EKG. Pt. Reported that pinching chest pain resolved without intervention and MD reviewed EKG and cleared pt. For discharge.

## 2016-03-07 ENCOUNTER — Telehealth: Payer: Self-pay | Admitting: Internal Medicine

## 2016-03-07 ENCOUNTER — Other Ambulatory Visit: Payer: Self-pay | Admitting: Family Medicine

## 2016-03-07 DIAGNOSIS — R350 Frequency of micturition: Secondary | ICD-10-CM

## 2016-03-07 NOTE — Telephone Encounter (Signed)
Ed thought she had chf flare  Please get her appt   With her CHF team for ed  Follow up    Otherwise she can come in on wednesday for 30 minute appt . In the interim

## 2016-03-07 NOTE — Telephone Encounter (Signed)
Pt has been dizzy for about a month, but became severe yesterday. Pt went to the ED, (around 6 pm) and was advised she may have some fluid around her heart. Pt was given lasix, and sent home. Shelly states pt does not fell much better today ,and does not feel hospital may have been thorough enough. Pt would like to see Dr Regis Bill today. No 30 min appointments.  I transferred [facility] to triage, because she was there with pt, but they did not answer after 7 minutes. Please advise.

## 2016-03-07 NOTE — Telephone Encounter (Signed)
Will send to cardiology physician.

## 2016-03-08 ENCOUNTER — Telehealth: Payer: Self-pay | Admitting: Internal Medicine

## 2016-03-08 NOTE — Telephone Encounter (Signed)
She was wanting to know something about a specialist you were going to be sending her to.

## 2016-03-08 NOTE — Telephone Encounter (Signed)
Results for being SOB?  If pt is sob than send her to triage.

## 2016-03-08 NOTE — Telephone Encounter (Signed)
Spoke to [place] at the CHF clinic.  Informed her that pt need a follow up from ED for CHF and continues to have SOB.  She will call the pt.

## 2016-03-08 NOTE — Telephone Encounter (Signed)
Pt would like to know what the results is for being short of breathe

## 2016-03-09 ENCOUNTER — Ambulatory Visit (HOSPITAL_COMMUNITY)
Admission: RE | Admit: 2016-03-09 | Discharge: 2016-03-09 | Disposition: A | Discharge status: Home / Self Care | Payer: Medicare Other | Source: Ambulatory Visit | Attending: Internal Medicine | Admitting: Internal Medicine

## 2016-03-09 ENCOUNTER — Encounter (HOSPITAL_COMMUNITY): Payer: Self-pay | Admitting: Internal Medicine

## 2016-03-09 VITALS — BP 120/60 | HR 69 | Wt 136.9 lb

## 2016-03-09 DIAGNOSIS — R072 Precordial pain: Secondary | ICD-10-CM

## 2016-03-09 DIAGNOSIS — Z950 Presence of cardiac pacemaker: Secondary | ICD-10-CM | POA: Insufficient documentation

## 2016-03-09 DIAGNOSIS — Z79899 Other long term (current) drug therapy: Secondary | ICD-10-CM | POA: Diagnosis not present

## 2016-03-09 DIAGNOSIS — E785 Hyperlipidemia, unspecified: Secondary | ICD-10-CM | POA: Insufficient documentation

## 2016-03-09 DIAGNOSIS — K219 Gastro-esophageal reflux disease without esophagitis: Secondary | ICD-10-CM | POA: Insufficient documentation

## 2016-03-09 DIAGNOSIS — E876 Hypokalemia: Secondary | ICD-10-CM | POA: Insufficient documentation

## 2016-03-09 DIAGNOSIS — Z85828 Personal history of other malignant neoplasm of skin: Secondary | ICD-10-CM | POA: Diagnosis not present

## 2016-03-09 DIAGNOSIS — I130 Hypertensive heart and chronic kidney disease with heart failure and stage 1 through stage 4 chronic kidney disease, or unspecified chronic kidney disease: Secondary | ICD-10-CM | POA: Diagnosis not present

## 2016-03-09 DIAGNOSIS — I2510 Atherosclerotic heart disease of native coronary artery without angina pectoris: Secondary | ICD-10-CM | POA: Diagnosis not present

## 2016-03-09 DIAGNOSIS — R001 Bradycardia, unspecified: Secondary | ICD-10-CM | POA: Insufficient documentation

## 2016-03-09 DIAGNOSIS — N184 Chronic kidney disease, stage 4 (severe): Secondary | ICD-10-CM | POA: Diagnosis not present

## 2016-03-09 DIAGNOSIS — Z7901 Long term (current) use of anticoagulants: Secondary | ICD-10-CM | POA: Diagnosis not present

## 2016-03-09 DIAGNOSIS — I5032 Chronic diastolic (congestive) heart failure: Secondary | ICD-10-CM | POA: Diagnosis not present

## 2016-03-09 DIAGNOSIS — R42 Dizziness and giddiness: Secondary | ICD-10-CM | POA: Insufficient documentation

## 2016-03-09 DIAGNOSIS — I480 Paroxysmal atrial fibrillation: Secondary | ICD-10-CM | POA: Insufficient documentation

## 2016-03-09 DIAGNOSIS — E039 Hypothyroidism, unspecified: Secondary | ICD-10-CM | POA: Diagnosis not present

## 2016-03-09 MED ORDER — METOLAZONE 2.5 MG PO TABS: 2.5000 mg | ORAL_TABLET | ORAL | Status: AC | PRN

## 2016-03-09 MED ORDER — MECLIZINE HCL 12.5 MG PO TABS: 12.5000 mg | ORAL_TABLET | Freq: Two times a day (BID) | ORAL | Status: DC | PRN

## 2016-03-09 MED ORDER — POTASSIUM CHLORIDE CRYS ER 20 MEQ PO TBCR: 60.0000 meq | EXTENDED_RELEASE_TABLET | Freq: Every day | ORAL | Status: DC

## 2016-03-09 NOTE — Patient Instructions (Signed)
Take Metolazone 2.5mg  for weight of 134lbs or greater.  Take an additional 22meq of Potassium when you take metolazone.   Take Meclizine 12.5mg  twice daily as needed for vertigo.   Follow up with Dr.Bensimhon in 6 weeks.

## 2016-03-09 NOTE — Progress Notes (Signed)
Patient ID: [name], female   DOB: [birth year], 82 y.o.   MRN: [id number] Patient ID: [name], female   DOB: 1925/07/01, 82 y.o.   MRN: [id number]    Advanced Heart Failure Clinic Note   PCP: Dr Netty Starring Primary Cardiologist: Dr. Harrington Challenger Primary HF: Dr. Haroldine Laws   HPI: [name] is a 82 y.o. female with the history of nonobstructive CAD by cardiac catheterization in 01/2014, persistent atrial fibrillation, diastolic CHF, HTN, HL, anemia, and CKD. Patient was admitted 04/2014 x2 with acute on chronic diastolic CHF. She underwent cardioversion with restoration of NSR. This resulted in profound bradycardia and hypotension requiring pacemaker implantation. Pacemaker implant was complicated by acute blood loss anemia requiring transfusion with PRBCs  Admitted to [facility] August 21 with increased dyspnea. Diuresed with IV lasix and transitioned to po lasix 40 mg twice a day. GI evaluated due to anemia and black stool. Had EGD with no acute findings. Colonoscopy was cancelled per daughter due to multiple medical problems. She continued on eliquis 2.5 mg twice a day.  Palliative Care consulted and the patient and family request aggressive care for heart failure. Discharge weight was 152 pounds.   Admitted to [facility] 11/9 after RHC due to elevated filling pressures. Once diuresed she was put on torsemide 20 mg twice a day. She was also restarted on amiodarone 200 mg daily and continue on eliquis 2.5 mg twice a day. Discharge weight was 141 pounds.   Admitted 12/12 to 12/17 with recurrent HF and AF. Admit weight was 136. Was diuresed to 131. Diuresis limited by low BP. Demadex increased from 20 bid to 40 bid.   Admitted 12/06/14 - 12/09/14 for ADHF. Initial weight 134 pounds. Diuresed down to 130 and felt better. Treatment limited due to hypotension and worsening renal failure with creatinine up to 2.6. Renal u/s ok. Metolazone stopped.  Admitted in 3/16 with hypotension. Started on midodrine 2.5  bid. Demadex cut back from 40 bid to 40 daily.   Admitted 5/17 - 5/18 with recurrent CP. Myoview normal EF > 65% no ischemia.  Admitted 05/09/15 for chest pain. Demedex decreased to 30 mg daily with worsening creatinine. Troponin flat and without trend. D/c weight 133 lb  Admitted 01/21/16 with atypical chest pain. Recent cath, stress test reviewed with her and re-inforced that it is not cardiac related. Ruled out ACS, esophageal dysmotility, normal esophagogram.  Echo 01/22/16 LVEF 60-65%, grade 2 DD  Was in the ER on 4/23 for orthopnea. CXR with mild congestion. BNP up to 420 (baseline around 200). Was discharged home.   She returns today for HF follow up. Comes in for post hospital f/u. Main complaint is dizziness. Worse if turning her head or bending over. Still with mild dyspnea. Weight at home stable at 132. Says she was 134 when she went to the ER. Taking all medications as directed. No bleeding on eliquis.  Creatinine was up to 2.2 on 4/3. But back down to 1.8-1.9 in ER (baseline)   Studies:  - LHC (3/15): Mid LM 20-30%, mid RCA 30-40%, EF 65%  - Echo (4/15): Mild LVH, EF 60-65%, no RWMA, mild BAE, trivial eff  - Echo 5/15: EF 45-50% septl HK - Carotid US (5/15): 40-59% RICA stenosis. 123456 LICA stenosis.  - 5/16 Myoview EF > 65% normal perfusion - Cath 3/15:   Left mainstem: The mid left main has 20-30% stenosis and there is moderate calcification noted. Left anterior descending (LAD): There is mild diffuse plaquing without significant  stenosis. The first diagonal is patent. Left circumflex (LCx): Left circumflex is patent. The first OM is patent without significant stenosis. Right coronary artery (RCA): This is a large, dominant vessel. The vessel is moderately calcified. The mid vessel has 30-40% stenosis.   RHC 09/22/14  RA = 11 RV = 49/6/10 PA = 49/22 (34) PCW = 18 (v = 25) Fick cardiac output/index = 6.4/4.0 PVR = 2.5 WU FA sat = 95% PA sat = 72%,74%  Labs 9/15 K 3.5  Creatinine 1.44 Hgb 10.7  Labs 8/15 K 4.1 Creatinine 1.46  Hgb 9.8  Labs 8/15 TSH 2.7 Labs 10/15: K 4.0, creatinine 1.93 Labs 11/15 K 4.6 Creatinine 3.9  Labs 11/15 K 3.6 Creatinine 1.61 hgb 13.7 Labs 11/15 K 4.4 Creatinine 1.7 Hgb 14/1  Labs 12/15 K 3.9 Creatinine 1.4  Labs 2/16: K 4.2 Creatinine 2.22 Labs 5/16: K 3.5 Creatinine 1.8 Labs 6/16 K 3.1 Creatinine 2.29   ROS: All systems negative except as listed in HPI, PMH and Problem List.  Past Medical History  Diagnosis Date  . Dyslipidemia   . Hypertension   . Palpitations     PVCs/bigeminy on event in May 2009 revealing this was relatively asymptomatic  . Chest pain     a. 2011 Neg MV;  b. 01/2014 Cath: LM 20-30, LAD nl, D1 nl, LCX nl, OM1 nl, RCA dom 30-1m, PD/PL nl, EF 65%->Med Rx.  . Degenerative joint disease   . Hx of varicella   . History of skin cancer     tafeen/ non melanoma.   . Thrombocytopenia ([place])   . Anemia   . Monoclonal gammopathies   . Right lower quadrant abdominal pain 07/19/2012    Recurrent  With nausea    This is new since she had ct for llq pain in [facility]    R/o appendiceal problem  Hernia  Get ct scan  And plan  Fu     . Diastolic CHF, acute on chronic ([place]) 01/31/2012  . Atrial fibrillation ([place])     a. Dx 01/2014->Eliquis started.  . Cancer ([place])   . Pacemaker   . Dysrhythmia   . Family history of adverse reaction to anesthesia     " multiple family members have difficulty waking "  . GERD (gastroesophageal reflux disease)   . Hypothyroidism     Current Outpatient Prescriptions  Medication Sig Dispense Refill  . acetaminophen (TYLENOL) 325 MG tablet Take 2 tablets (650 mg total) by mouth every 4 (four) hours as needed for headache or mild pain.    Marland Kitchen amiodarone (PACERONE) 200 MG tablet TAKE ONE TABLET BY MOUTH ONCE DAILY 30 tablet 5  . apixaban (ELIQUIS) 2.5 MG TABS tablet Take 1 tablet (2.5 mg total) by mouth 2 (two) times daily. 60 tablet 6  . ferrous sulfate 325 (65 FE) MG tablet TAKE ONE  TABLET BY MOUTH TWICE DAILY WITH A MEAL 180 tablet 0  . levothyroxine (SYNTHROID, LEVOTHROID) 75 MCG tablet TAKE ONE TABLET BY MOUTH ONCE DAILY 90 tablet 3  . loratadine (CLARITIN) 10 MG tablet Take 10 mg by mouth at bedtime as needed for allergies or rhinitis.     Marland Kitchen metolazone (ZAROXOLYN) 2.5 MG tablet Take 1 tablet (2.5 mg total) by mouth as needed (for weight 136 lb or greater). 10 tablet 3  . nitroGLYCERIN (NITROSTAT) 0.4 MG SL tablet Place 1 tablet (0.4 mg total) under the tongue every 5 (five) minutes as needed. For chest pain 25 tablet 6  . pantoprazole (  PROTONIX) 40 MG tablet TAKE ONE TABLET BY MOUTH ONCE DAILY 30 tablet 5  . potassium chloride SA (K-DUR,KLOR-CON) 20 MEQ tablet Take 3 tablets (60 mEq total) by mouth daily. 90 tablet 2  . simvastatin (ZOCOR) 10 MG tablet TAKE ONE TABLET BY MOUTH AT BEDTIME 30 tablet 0  . torsemide (DEMADEX) 20 MG tablet Take 2 tablets (40 mg total) by mouth daily. 60 tablet 6   No current facility-administered medications for this encounter.   Filed Vitals:   03/09/16 1421  BP: 120/60  Pulse: 69  Weight: 136 lb 14.4 oz (62.097 kg)  SpO2: 96%    Wt Readings from Last 3 Encounters:  03/09/16 136 lb 14.4 oz (62.097 kg)  02/23/16 130 lb (58.968 kg)  02/15/16 134 lb (60.782 kg)     PHYSICAL EXAM: General:  Elderly, frail appearing. No resp difficulty. Caregiver present. Ambulated into clinic using cane without difficulty.   HEENT: Normal Neck: supple. JVP flat. Carotids 2+ bilaterally; no bruits. No thyromegaly or nodule noted. Cor: PMI normal. RRR. No rubs, gallops. 1/6 diastolic murmur.  Lungs: CTAB, normal effort Abdomen: soft, NT, ND, no HSM. No bruits or masses. +BS  Extremities: no cyanosis, clubbing, rash, R and LLE severe varicose veins. No edema noted. Neuro: alert & orientedx3, cranial nerves grossly intact. Moves all 4 extremities w/o difficulty. Affect pleasant.   ASSESSMENT & PLAN:  1. Chronic Diastolic Heart Failure - She has a  very narrow euvolemic window. NYHA II-III.  - Volume status stable.  ReDS reading 32% today. Continue 40 mg torsemide daily.   - Continue to take metolazone (and KCL 20) only as needed for weight 134 or greater.  - Reinforced the need and importance of daily weights, a low sodium diet, and fluid restriction (less than 2 L a day). Instructed to call the HF clinic if weight increases more than 3 lbs overnight or 5 lbs in a week.  2.PAF - symptomatic bradycardia s/p PPM.   - Previously thought to have failed amiodarone and had chronic AF. However in November in the hospital she was in NSR so amio restarted.  - She remains in NSR by exam today. EKG during recent admission also showed NSR.  - Continue amiodarone 200 mg daily. Continue Eliquis 2.5 mg twice a day. No bleeding problems.    3. CKD stage IV - Stable on recent hospital labs.  4. Deconditioning- Continue to increase activity as able.  5. Hypokalemia - Stable during recent admission. Continue kcl 6. Chest pain, atypical  - resolved. Stress test normal 5/16. Minimal CAD on cath 3/15 - had recent full GI work up as well, normal.  7. Dizziness. - suspect due to vertigo. Will give meclizine 12.5 to take prn. Follow up with PT for stone repositioning exercises.  Follow up in 4 months.   Glori Bickers MD 2:58 PM

## 2016-03-09 NOTE — Progress Notes (Signed)
REDS VEST READING= 32 CHEST RULER=13  VEST FITTING TASKS: POSTURE=standing  HEIGHT MARKER=Short CENTER STRIP=align  COMMENTS:

## 2016-03-09 NOTE — Telephone Encounter (Signed)
Recommend she follow up with PCP for possible GI referral if she continues to have symptoms. States she had an EGD many years ago. Likely does not need anything further.

## 2016-03-11 NOTE — Telephone Encounter (Signed)
Spoke to the pt.  Does not wish for gastro referral at this time.

## 2016-03-13 ENCOUNTER — Other Ambulatory Visit (HOSPITAL_COMMUNITY): Payer: Self-pay | Admitting: Internal Medicine

## 2016-03-13 ENCOUNTER — Other Ambulatory Visit: Payer: Self-pay | Admitting: Internal Medicine

## 2016-03-15 NOTE — Telephone Encounter (Signed)
Sent to the pharmacy by e-scribe. 

## 2016-03-16 ENCOUNTER — Emergency Department (HOSPITAL_COMMUNITY)
Admission: EM | Admit: 2016-03-16 | Discharge: 2016-03-17 | Disposition: A | Discharge status: Home / Self Care | Payer: Medicare Other | Attending: Emergency Medicine | Admitting: Emergency Medicine

## 2016-03-16 ENCOUNTER — Encounter (HOSPITAL_COMMUNITY): Payer: Self-pay | Admitting: Emergency Medicine

## 2016-03-16 ENCOUNTER — Emergency Department (HOSPITAL_COMMUNITY): Payer: Medicare Other

## 2016-03-16 DIAGNOSIS — Z79899 Other long term (current) drug therapy: Secondary | ICD-10-CM | POA: Insufficient documentation

## 2016-03-16 DIAGNOSIS — I8393 Asymptomatic varicose veins of bilateral lower extremities: Secondary | ICD-10-CM | POA: Insufficient documentation

## 2016-03-16 DIAGNOSIS — I10 Essential (primary) hypertension: Secondary | ICD-10-CM | POA: Insufficient documentation

## 2016-03-16 DIAGNOSIS — R1013 Epigastric pain: Secondary | ICD-10-CM | POA: Insufficient documentation

## 2016-03-16 DIAGNOSIS — I5032 Chronic diastolic (congestive) heart failure: Secondary | ICD-10-CM | POA: Diagnosis not present

## 2016-03-16 DIAGNOSIS — Z8619 Personal history of other infectious and parasitic diseases: Secondary | ICD-10-CM | POA: Diagnosis not present

## 2016-03-16 DIAGNOSIS — D649 Anemia, unspecified: Secondary | ICD-10-CM | POA: Insufficient documentation

## 2016-03-16 DIAGNOSIS — Z9889 Other specified postprocedural states: Secondary | ICD-10-CM | POA: Insufficient documentation

## 2016-03-16 DIAGNOSIS — Z86018 Personal history of other benign neoplasm: Secondary | ICD-10-CM | POA: Insufficient documentation

## 2016-03-16 DIAGNOSIS — Z85828 Personal history of other malignant neoplasm of skin: Secondary | ICD-10-CM | POA: Diagnosis not present

## 2016-03-16 DIAGNOSIS — R079 Chest pain, unspecified: Secondary | ICD-10-CM | POA: Insufficient documentation

## 2016-03-16 DIAGNOSIS — E039 Hypothyroidism, unspecified: Secondary | ICD-10-CM | POA: Diagnosis not present

## 2016-03-16 DIAGNOSIS — E785 Hyperlipidemia, unspecified: Secondary | ICD-10-CM | POA: Insufficient documentation

## 2016-03-16 DIAGNOSIS — K219 Gastro-esophageal reflux disease without esophagitis: Secondary | ICD-10-CM | POA: Diagnosis not present

## 2016-03-16 DIAGNOSIS — Z950 Presence of cardiac pacemaker: Secondary | ICD-10-CM | POA: Insufficient documentation

## 2016-03-16 DIAGNOSIS — I4891 Unspecified atrial fibrillation: Secondary | ICD-10-CM | POA: Diagnosis not present

## 2016-03-16 LAB — HEPATIC FUNCTION PANEL
ALT: 14 U/L (ref 14–54)
AST: 17 U/L (ref 15–41)
Albumin: 3.3 g/dL — ABNORMAL LOW (ref 3.5–5.0)
Alkaline Phosphatase: 65 U/L (ref 38–126)
BILIRUBIN DIRECT: 0.1 mg/dL (ref 0.1–0.5)
BILIRUBIN INDIRECT: 0.5 mg/dL (ref 0.3–0.9)
BILIRUBIN TOTAL: 0.6 mg/dL (ref 0.3–1.2)
Total Protein: 6.4 g/dL — ABNORMAL LOW (ref 6.5–8.1)

## 2016-03-16 LAB — CBC
HCT: 38.3 % (ref 36.0–46.0)
HEMOGLOBIN: 12.3 g/dL (ref 12.0–15.0)
MCH: 30.5 pg (ref 26.0–34.0)
MCHC: 32.1 g/dL (ref 30.0–36.0)
MCV: 95 fL (ref 78.0–100.0)
Platelets: 122 10*3/uL — ABNORMAL LOW (ref 150–400)
RBC: 4.03 MIL/uL (ref 3.87–5.11)
RDW: 14.8 % (ref 11.5–15.5)
WBC: 4.2 10*3/uL (ref 4.0–10.5)

## 2016-03-16 LAB — BASIC METABOLIC PANEL
Anion gap: 10 (ref 5–15)
BUN: 30 mg/dL — AB (ref 6–20)
CO2: 27 mmol/L (ref 22–32)
CREATININE: 2.2 mg/dL — AB (ref 0.44–1.00)
Calcium: 9.3 mg/dL (ref 8.9–10.3)
Chloride: 106 mmol/L (ref 101–111)
GFR calc Af Amer: 22 mL/min — ABNORMAL LOW (ref >60)
GFR, EST NON AFRICAN AMERICAN: 19 mL/min — AB (ref >60)
Glucose, Bld: 115 mg/dL — ABNORMAL HIGH (ref 65–99)
POTASSIUM: 4.2 mmol/L (ref 3.5–5.1)
SODIUM: 143 mmol/L (ref 135–145)

## 2016-03-16 LAB — I-STAT TROPONIN, ED: TROPONIN I, POC: 0.03 ng/mL (ref 0.00–0.08)

## 2016-03-16 LAB — LIPASE, BLOOD: Lipase: 29 U/L (ref 11–51)

## 2016-03-16 MED ORDER — FAMOTIDINE 20 MG PO TABS
20.0000 mg | ORAL_TABLET | Freq: Once | ORAL | Status: AC
  Administered 2016-03-16: 20 mg via ORAL
  Filled 2016-03-16: qty 1

## 2016-03-16 NOTE — Discharge Instructions (Signed)
Nonspecific Chest Pain  °Chest pain can be caused by many different conditions. There is always a chance that your pain could be related to something serious, such as a heart attack or a blood clot in your lungs. Chest pain can also be caused by conditions that are not life-threatening. If you have chest pain, it is very important to follow up with your health care provider. °CAUSES  °Chest pain can be caused by: °· Heartburn. °· Pneumonia or bronchitis. °· Anxiety or stress. °· Inflammation around your heart (pericarditis) or lung (pleuritis or pleurisy). °· A blood clot in your lung. °· A collapsed lung (pneumothorax). It can develop suddenly on its own (spontaneous pneumothorax) or from trauma to the chest. °· Shingles infection (varicella-zoster virus). °· Heart attack. °· Damage to the bones, muscles, and cartilage that make up your chest wall. This can include: °¨ Bruised bones due to injury. °¨ Strained muscles or cartilage due to frequent or repeated coughing or overwork. °¨ Fracture to one or more ribs. °¨ Sore cartilage due to inflammation (costochondritis). °RISK FACTORS  °Risk factors for chest pain may include: °· Activities that increase your risk for trauma or injury to your chest. °· Respiratory infections or conditions that cause frequent coughing. °· Medical conditions or overeating that can cause heartburn. °· Heart disease or family history of heart disease. °· Conditions or health behaviors that increase your risk of developing a blood clot. °· Having had chicken pox (varicella zoster). °SIGNS AND SYMPTOMS °Chest pain can feel like: °· Burning or tingling on the surface of your chest or deep in your chest. °· Crushing, pressure, aching, or squeezing pain. °· Dull or sharp pain that is worse when you move, cough, or take a deep breath. °· Pain that is also felt in your back, neck, shoulder, or arm, or pain that spreads to any of these areas. °Your chest pain may come and go, or it may stay  constant. °DIAGNOSIS °Lab tests or other studies may be needed to find the cause of your pain. Your health care provider may have you take a test called an ambulatory ECG (electrocardiogram). An ECG records your heartbeat patterns at the time the test is performed. You may also have other tests, such as: °· Transthoracic echocardiogram (TTE). During echocardiography, sound waves are used to create a picture of all of the heart structures and to look at how blood flows through your heart. °· Transesophageal echocardiogram (TEE). This is a more advanced imaging test that obtains images from inside your body. It allows your health care provider to see your heart in finer detail. °· Cardiac monitoring. This allows your health care provider to monitor your heart rate and rhythm in real time. °· Holter monitor. This is a portable device that records your heartbeat and can help to diagnose abnormal heartbeats. It allows your health care provider to track your heart activity for several days, if needed. °· Stress tests. These can be done through exercise or by taking medicine that makes your heart beat more quickly. °· Blood tests. °· Imaging tests. °TREATMENT  °Your treatment depends on what is causing your chest pain. Treatment may include: °· Medicines. These may include: °¨ Acid blockers for heartburn. °¨ Anti-inflammatory medicine. °¨ Pain medicine for inflammatory conditions. °¨ Antibiotic medicine, if an infection is present. °¨ Medicines to dissolve blood clots. °¨ Medicines to treat coronary artery disease. °· Supportive care for conditions that do not require medicines. This may include: °¨ Resting. °¨ Applying heat   or cold packs to injured areas. °¨ Limiting activities until pain decreases. °HOME CARE INSTRUCTIONS °· If you were prescribed an antibiotic medicine, finish it all even if you start to feel better. °· Avoid any activities that bring on chest pain. °· Do not use any tobacco products, including  cigarettes, chewing tobacco, or electronic cigarettes. If you need help quitting, ask your health care provider. °· Do not drink alcohol. °· Take medicines only as directed by your health care provider. °· Keep all follow-up visits as directed by your health care provider. This is important. This includes any further testing if your chest pain does not go away. °· If heartburn is the cause for your chest pain, you may be told to keep your head raised (elevated) while sleeping. This reduces the chance that acid will go from your stomach into your esophagus. °· Make lifestyle changes as directed by your health care provider. These may include: °¨ Getting regular exercise. Ask your health care provider to suggest some activities that are safe for you. °¨ Eating a heart-healthy diet. A registered dietitian can help you to learn healthy eating options. °¨ Maintaining a healthy weight. °¨ Managing diabetes, if necessary. °¨ Reducing stress. °SEEK MEDICAL CARE IF: °· Your chest pain does not go away after treatment. °· You have a rash with blisters on your chest. °· You have a fever. °SEEK IMMEDIATE MEDICAL CARE IF:  °· Your chest pain is worse. °· You have an increasing cough, or you cough up blood. °· You have severe abdominal pain. °· You have severe weakness. °· You faint. °· You have chills. °· You have sudden, unexplained chest discomfort. °· You have sudden, unexplained discomfort in your arms, back, neck, or jaw. °· You have shortness of breath at any time. °· You suddenly start to sweat, or your skin gets clammy. °· You feel nauseous or you vomit. °· You suddenly feel light-headed or dizzy. °· Your heart begins to beat quickly, or it feels like it is skipping beats. °These symptoms may represent a serious problem that is an emergency. Do not wait to see if the symptoms will go away. Get medical help right away. Call your local emergency services (911 in the [place] Do not drive yourself to the hospital. °  °This  information is not intended to replace advice given to you by your health care provider. Make sure you discuss any questions you have with your health care provider. °  °Document Released: 08/10/2005 Document Revised: 11/21/2014 Document Reviewed: 06/06/2014 °Elsevier Interactive Patient Education ©2016 [employer] ° °

## 2016-03-16 NOTE — ED Notes (Signed)
The pt watching tv  No pain  At present

## 2016-03-16 NOTE — ED Notes (Signed)
No pain

## 2016-03-16 NOTE — ED Notes (Signed)
No pain at present  Alert oriented skin warm and dry

## 2016-03-16 NOTE — ED Notes (Signed)
The pt has a dual chamber pacemaker

## 2016-03-16 NOTE — ED Provider Notes (Signed)
CSN: [id number]     Arrival date & time 03/16/16  2101 History   First MD Initiated Contact with Patient 03/16/16 2116     Chief Complaint  Patient presents with  . Chest Pain     (Consider location/radiation/quality/duration/timing/severity/associated sxs/prior Treatment) HPI Patient got a fairly sudden pain in her epigastrium and lower chest. She reports this started about 2 hours prior to my evaluation. With aching in quality. She reports she tried one nitroglycerin and did not get any change. She reports she waited a little longer and then it started to go away. Patient denies it radiated. She denies shortness of breath, nausea, lightheadedness. She reports last time she had a similar type of pain was probably about 2 months ago. No calf pain or lower extremity swelling. Patient was brought by EMS, by EMS she received 1 additional nitroglycerin and aspirin. She reports she used it eat Tums like they were going out of style. At one point she just decided she wasn't noted do that anymore and she was going to be careful with her diet. She denies being on any medications for some time. Past Medical History  Diagnosis Date  . Dyslipidemia   . Hypertension   . Palpitations     PVCs/bigeminy on event in May 2009 revealing this was relatively asymptomatic  . Chest pain     a. 2011 Neg MV;  b. 01/2014 Cath: LM 20-30, LAD nl, D1 nl, LCX nl, OM1 nl, RCA dom 30-59m, PD/PL nl, EF 65%->Med Rx.  . Degenerative joint disease   . Hx of varicella   . History of skin cancer     tafeen/ non melanoma.   . Thrombocytopenia ([place])   . Anemia   . Monoclonal gammopathies   . Right lower quadrant abdominal pain 07/19/2012    Recurrent  With nausea    This is new since she had ct for llq pain in [facility]    R/o appendiceal problem  Hernia  Get ct scan  And plan  Fu     . Diastolic CHF, acute on chronic ([place]) 01/31/2012  . Atrial fibrillation ([place])     a. Dx 01/2014->Eliquis started.  . Cancer ([place])   . Pacemaker    . Dysrhythmia   . Family history of adverse reaction to anesthesia     " multiple family members have difficulty waking "  . GERD (gastroesophageal reflux disease)   . Hypothyroidism    Past Surgical History  Procedure Laterality Date  . Cholecystectomy  1999  . Cesarean section      times 2  . Shoulder surgery  1996    Right   . Cataract extraction      Bilateral  implantt  . Cardioversion N/A 02/26/2014    Procedure: CARDIOVERSION AT BEDSIDE;  Surgeon: Pixie Casino, MD;  Location: [place];  Service: Cardiovascular;  Laterality: N/A;  . Cardioversion N/A 04/22/2014    Procedure: CARDIOVERSION (BEDSIDE);  Surgeon: [name], MD;  Location: [facility] OR;  Service: Cardiovascular;  Laterality: N/A;  . Pacemaker insertion  04/23/2014    STJ Assurity dual chamber pacemaker implanted by Dr Rayann Heman  . Insert / replace / remove pacemaker    . Esophagogastroduodenoscopy N/A 07/06/2014    Procedure: ESOPHAGOGASTRODUODENOSCOPY (EGD);  Surgeon: Ladene Artist, MD;  Location: [facility] ENDOSCOPY;  Service: Endoscopy;  Laterality: N/A;  . Left heart catheterization with coronary angiogram N/A 01/29/2014    Procedure: LEFT HEART CATHETERIZATION WITH CORONARY ANGIOGRAM;  Surgeon: Juanda Bond  Burt Knack, MD;  Location: [facility] CATH LAB;  Service: Cardiovascular;  Laterality: N/A;  . Permanent pacemaker insertion N/A 04/23/2014    Procedure: PERMANENT PACEMAKER INSERTION;  Surgeon: Evans Lance, MD;  Location: [facility] CATH LAB;  Service: Cardiovascular;  Laterality: N/A;  . Right heart catheterization N/A 09/22/2014    Procedure: RIGHT HEART CATH;  Surgeon: Jolaine Artist, MD;  Location: [facility] CATH LAB;  Service: Cardiovascular;  Laterality: N/A;   Family History  Problem Relation Age of Onset  . Coronary artery disease Father   . Heart disease Father   . Lung cancer    . Alzheimer's disease Mother   . Cancer Brother 18    lung cancer  . Heart attack Father    Social History  Substance Use Topics  . Smoking status:  Never Smoker   . Smokeless tobacco: Never Used  . Alcohol Use: No   OB History    No data available     Review of Systems  10 Systems reviewed and are negative for acute change except as noted in the HPI.   Allergies  Other; Tramadol; and Ibuprofen  Home Medications   Prior to Admission medications   Medication Sig Start Date End Date Taking? Authorizing Provider  acetaminophen (TYLENOL) 325 MG tablet Take 2 tablets (650 mg total) by mouth every 4 (four) hours as needed for headache or mild pain. 09/26/14  Yes Isaiah Serge, NP  amiodarone (PACERONE) 200 MG tablet TAKE ONE TABLET BY MOUTH ONCE DAILY 11/17/15  Yes Evans Lance, MD  ELIQUIS 2.5 MG TABS tablet TAKE ONE TABLET BY MOUTH TWICE DAILY 03/14/16  Yes Jolaine Artist, MD  Ferrous Sulfate (IRON) 325 (65 Fe) MG TABS TAKE ONE TABLET BY MOUTH TWICE DAILY WITH A MEAL 03/15/16  Yes Burnis Medin, MD  levothyroxine (SYNTHROID, LEVOTHROID) 75 MCG tablet TAKE ONE TABLET BY MOUTH ONCE DAILY 02/29/16  Yes Burnis Medin, MD  loratadine (CLARITIN) 10 MG tablet Take 10 mg by mouth at bedtime as needed for allergies or rhinitis.    Yes Historical Provider, MD  meclizine (ANTIVERT) 12.5 MG tablet Take 1 tablet (12.5 mg total) by mouth 2 (two) times daily as needed for dizziness. 03/09/16  Yes Jolaine Artist, MD  metolazone (ZAROXOLYN) 2.5 MG tablet Take 1 tablet (2.5 mg total) by mouth as needed (for weight 134 lb or greater). 03/09/16  Yes Jolaine Artist, MD  nitroGLYCERIN (NITROSTAT) 0.4 MG SL tablet Place 1 tablet (0.4 mg total) under the tongue every 5 (five) minutes as needed. For chest pain 12/02/13  Yes Fay Records, MD  pantoprazole (PROTONIX) 40 MG tablet TAKE ONE TABLET BY MOUTH ONCE DAILY 10/05/15  Yes Burnis Medin, MD  potassium chloride SA (K-DUR,KLOR-CON) 20 MEQ tablet Take 3 tablets (60 mEq total) by mouth daily. Take an additional 3meq with Metolazone. 03/09/16  Yes Jolaine Artist, MD  simvastatin (ZOCOR) 10 MG tablet  TAKE ONE TABLET BY MOUTH AT BEDTIME 03/01/16  Yes Jolaine Artist, MD  torsemide (DEMADEX) 20 MG tablet Take 2 tablets (40 mg total) by mouth daily. 10/28/15  Yes Shaune Pascal Bensimhon, MD   BP 99/44 mmHg  Pulse 68  Temp(Src) 98.4 F (36.9 C) (Oral)  Resp 18  Ht 4\' 10"  (1.473 m)  Wt 133 lb (60.328 kg)  BMI 27.80 kg/m2  SpO2 98% Physical Exam  Constitutional: She is oriented to person, place, and time. She appears well-developed and well-nourished.  HENT:  Head:  Normocephalic and atraumatic.  Eyes: EOM are normal. Pupils are equal, round, and reactive to light.  Neck: Neck supple.  Cardiovascular: Normal rate, regular rhythm, normal heart sounds and intact distal pulses.   Pulmonary/Chest: Effort normal and breath sounds normal.  Lungs are clear with very good air flow.  Abdominal: Soft. Bowel sounds are normal. She exhibits no distension. There is no tenderness.  Mild epigastric discomfort to palpation.  Musculoskeletal: Normal range of motion. She exhibits no edema or tenderness.  Patient has extensive varicose veins of the lower extremities. She however does not have significant edema or any calf tenderness.  Neurological: She is alert and oriented to person, place, and time. She has normal strength. Coordination normal. GCS eye subscore is 4. GCS verbal subscore is 5. GCS motor subscore is 6.  Skin: Skin is warm, dry and intact.  Psychiatric: She has a normal mood and affect.    ED Course  Procedures (including critical care time) Labs Review Labs Reviewed  BASIC METABOLIC PANEL - Abnormal; Notable for the following:    Glucose, Bld 115 (*)    BUN 30 (*)    Creatinine, Ser 2.20 (*)    GFR calc non Af Amer 19 (*)    GFR calc Af Amer 22 (*)    All other components within normal limits  CBC - Abnormal; Notable for the following:    Platelets 122 (*)    All other components within normal limits  HEPATIC FUNCTION PANEL - Abnormal; Notable for the following:    Total Protein  6.4 (*)    Albumin 3.3 (*)    All other components within normal limits  LIPASE, BLOOD  I-STAT TROPOININ, ED    Imaging Review Dg Chest 2 View  03/16/2016  CLINICAL DATA:  Chest pain and pressure.  Dyspnea. EXAM: CHEST  2 VIEW COMPARISON:  03/06/2016 FINDINGS: There are intact appearances of the transvenous cardiac leads. Unchanged mild cardiomegaly. Unchanged moderate left hemidiaphragm elevation. There is mild vascular and interstitial prominence which may represent a degree of congestive heart failure. No frank alveolar edema. No effusions. No focal airspace consolidation. IMPRESSION: Mild vascular and interstitial prominence, possibly a mild degree of congestive heart failure. No alveolar edema. No effusion. Electronically Signed   By: Andreas Newport M.D.   On: 03/16/2016 21:55   I have personally reviewed and evaluated these images and lab results as part of my medical decision-making.   EKG Interpretation   Date/Time:  Wednesday Mar 16 2016 21:04:30 EDT Ventricular Rate:  73 PR Interval:  167 QRS Duration: 168 QT Interval:  494 QTC Calculation: 544 R Axis:   73 Text Interpretation:  A-V dual-paced complexes w/ some inhibition No  further analysis attempted due to paced rhythm Baseline wander in lead(s)  I II aVR aVL aVF agree. no change from old Confirmed by Johnney Killian, MD,  Jeannie Done [id number]) on 03/16/2016 9:10:00 PM      MDM   Final diagnoses:  Chest pain, unspecified chest pain type   Patient's pain is resolved. Cardiac enzymes are negative. EKG is unchanged. At this time findings are not suggestive of an acute MI. Patient will be counseled to follow-up with her family physician as soon as possible. Patient is to continue her outpatient medications.    Charlesetta Shanks, MD 03/16/16 2337

## 2016-03-16 NOTE — ED Notes (Signed)
Pt to xray

## 2016-03-16 NOTE — ED Notes (Signed)
Pt to ED via [facility] with c/o substernal chest pain/pressure, no radiating. Denies n/v. Reports h/o CHF and Atrial Fibrillation. Pt took 1 NTG tablet prior to EMS arrival and was given a 2nd NTG tablet enroute to ED. Now reports "pressure is still there, but it's better than it was". Pt was also given 324 mg of ASA pta.

## 2016-03-17 NOTE — ED Notes (Signed)
Patient attempting to reach daughter for transportation to home via phone.

## 2016-03-28 ENCOUNTER — Emergency Department (HOSPITAL_COMMUNITY): Payer: Medicare Other

## 2016-03-28 ENCOUNTER — Other Ambulatory Visit (INDEPENDENT_AMBULATORY_CARE_PROVIDER_SITE_OTHER): Payer: Medicare Other

## 2016-03-28 ENCOUNTER — Ambulatory Visit (INDEPENDENT_AMBULATORY_CARE_PROVIDER_SITE_OTHER): Payer: Medicare Other | Admitting: Family Medicine

## 2016-03-28 ENCOUNTER — Emergency Department (HOSPITAL_COMMUNITY)
Admission: EM | Admit: 2016-03-28 | Discharge: 2016-03-28 | Disposition: A | Discharge status: Home / Self Care | Payer: Medicare Other | Attending: Emergency Medicine | Admitting: Emergency Medicine

## 2016-03-28 ENCOUNTER — Encounter: Payer: Self-pay | Admitting: Family Medicine

## 2016-03-28 VITALS — BP 120/70 | HR 69 | Wt 138.2 lb

## 2016-03-28 DIAGNOSIS — K219 Gastro-esophageal reflux disease without esophagitis: Secondary | ICD-10-CM | POA: Diagnosis not present

## 2016-03-28 DIAGNOSIS — R0789 Other chest pain: Secondary | ICD-10-CM | POA: Insufficient documentation

## 2016-03-28 DIAGNOSIS — I10 Essential (primary) hypertension: Secondary | ICD-10-CM

## 2016-03-28 DIAGNOSIS — R300 Dysuria: Secondary | ICD-10-CM | POA: Diagnosis not present

## 2016-03-28 DIAGNOSIS — Z85828 Personal history of other malignant neoplasm of skin: Secondary | ICD-10-CM | POA: Insufficient documentation

## 2016-03-28 DIAGNOSIS — Z87898 Personal history of other specified conditions: Secondary | ICD-10-CM

## 2016-03-28 DIAGNOSIS — M1990 Unspecified osteoarthritis, unspecified site: Secondary | ICD-10-CM | POA: Insufficient documentation

## 2016-03-28 DIAGNOSIS — Z8619 Personal history of other infectious and parasitic diseases: Secondary | ICD-10-CM | POA: Diagnosis not present

## 2016-03-28 DIAGNOSIS — Z950 Presence of cardiac pacemaker: Secondary | ICD-10-CM | POA: Diagnosis not present

## 2016-03-28 DIAGNOSIS — D649 Anemia, unspecified: Secondary | ICD-10-CM | POA: Diagnosis not present

## 2016-03-28 DIAGNOSIS — Z789 Other specified health status: Secondary | ICD-10-CM

## 2016-03-28 DIAGNOSIS — Z79899 Other long term (current) drug therapy: Secondary | ICD-10-CM | POA: Diagnosis not present

## 2016-03-28 DIAGNOSIS — E039 Hypothyroidism, unspecified: Secondary | ICD-10-CM | POA: Insufficient documentation

## 2016-03-28 DIAGNOSIS — I5033 Acute on chronic diastolic (congestive) heart failure: Secondary | ICD-10-CM | POA: Insufficient documentation

## 2016-03-28 DIAGNOSIS — R079 Chest pain, unspecified: Secondary | ICD-10-CM | POA: Diagnosis present

## 2016-03-28 LAB — I-STAT TROPONIN, ED
Troponin i, poc: 0.02 ng/mL (ref 0.00–0.08)
Troponin i, poc: 0.03 ng/mL (ref 0.00–0.08)

## 2016-03-28 LAB — BASIC METABOLIC PANEL
ANION GAP: 8 (ref 5–15)
BUN: 36 mg/dL — AB (ref 6–20)
BUN: 38 mg/dL — AB (ref 6–23)
CALCIUM: 9.7 mg/dL (ref 8.4–10.5)
CHLORIDE: 108 mmol/L (ref 101–111)
CHLORIDE: 108 meq/L (ref 96–112)
CO2: 26 mmol/L (ref 22–32)
CO2: 27 meq/L (ref 19–32)
CREATININE: 1.9 mg/dL — AB (ref 0.40–1.20)
Calcium: 9.2 mg/dL (ref 8.9–10.3)
Creatinine, Ser: 2.01 mg/dL — ABNORMAL HIGH (ref 0.44–1.00)
GFR calc Af Amer: 24 mL/min — ABNORMAL LOW (ref >60)
GFR calc non Af Amer: 21 mL/min — ABNORMAL LOW (ref >60)
GFR: 26.45 mL/min — ABNORMAL LOW (ref >60.00)
GLUCOSE: 110 mg/dL — AB (ref 65–99)
Glucose, Bld: 128 mg/dL — ABNORMAL HIGH (ref 70–99)
POTASSIUM: 4.1 mmol/L (ref 3.5–5.1)
Potassium: 4.3 mEq/L (ref 3.5–5.1)
Sodium: 142 mmol/L (ref 135–145)
Sodium: 142 mEq/L (ref 135–145)

## 2016-03-28 LAB — CBC
HEMATOCRIT: 37.8 % (ref 36.0–46.0)
HEMOGLOBIN: 12.3 g/dL (ref 12.0–15.0)
MCH: 31.1 pg (ref 26.0–34.0)
MCHC: 32.5 g/dL (ref 30.0–36.0)
MCV: 95.7 fL (ref 78.0–100.0)
Platelets: 112 10*3/uL — ABNORMAL LOW (ref 150–400)
RBC: 3.95 MIL/uL (ref 3.87–5.11)
RDW: 14.5 % (ref 11.5–15.5)
WBC: 4.5 10*3/uL (ref 4.0–10.5)

## 2016-03-28 LAB — POC URINALSYSI DIPSTICK (AUTOMATED)
Leukocytes, UA: NEGATIVE
SPEC GRAV UA: 1.02
UROBILINOGEN UA: NEGATIVE
pH, UA: 6.5

## 2016-03-28 LAB — BRAIN NATRIURETIC PEPTIDE: B Natriuretic Peptide: 289.6 pg/mL — ABNORMAL HIGH (ref 0.0–100.0)

## 2016-03-28 NOTE — Patient Instructions (Signed)
Please follow up with Dr. Regis Bill in one week for a hospital follow up. Drink fluids and return to clinic if you develop fever >101, chills, or additional urinary symptoms that are not improving prior to your follow up visit.  Dysuria Dysuria is pain or discomfort while urinating. The pain or discomfort may be felt in the tube that carries urine out of the bladder (urethra) or in the surrounding tissue of the genitals. The pain may also be felt in the groin area, lower abdomen, and lower back. You may have to urinate frequently or have the sudden feeling that you have to urinate (urgency). Dysuria can affect both men and women, but is more common in women. Dysuria can be caused by many different things, including:  Urinary tract infection in women.  Infection of the kidney or bladder.  Kidney stones or bladder stones.  Certain sexually transmitted infections (STIs), such as chlamydia.  Dehydration.  Inflammation of the vagina.  Use of certain medicines.  Use of certain soaps or scented products that cause irritation. HOME CARE INSTRUCTIONS Watch your dysuria for any changes. The following actions may help to reduce any discomfort you are feeling:  Drink enough fluid to keep your urine clear or pale yellow.  Empty your bladder often. Avoid holding urine for long periods of time.  After a bowel movement or urination, women should cleanse from front to back, using each tissue only once.  Empty your bladder after sexual intercourse.  Take medicines only as directed by your health care provider.  If you were prescribed an antibiotic medicine, finish it all even if you start to feel better.  Avoid caffeine, tea, and alcohol. They can irritate the bladder and make dysuria worse. In men, alcohol may irritate the prostate.  Keep all follow-up visits as directed by your health care provider. This is important.  If you had any tests done to find the cause of dysuria, it is your  responsibility to obtain your test results. Ask the lab or department performing the test when and how you will get your results. Talk with your health care provider if you have any questions about your results. SEEK MEDICAL CARE IF:  You develop pain in your back or sides.  You have a fever.  You have nausea or vomiting.  You have blood in your urine.  You are not urinating as often as you usually do. SEEK IMMEDIATE MEDICAL CARE IF:  You pain is severe and not relieved with medicines.  You are unable to hold down any fluids.  You or someone else notices a change in your mental function.  You have a rapid heartbeat at rest.  You have shaking or chills.  You feel extremely weak.   This information is not intended to replace advice given to you by your health care provider. Make sure you discuss any questions you have with your health care provider.   Document Released: 07/29/2004 Document Revised: 11/21/2014 Document Reviewed: 06/26/2014 Elsevier Interactive Patient Education [employer].

## 2016-03-28 NOTE — ED Notes (Signed)
Woke up with CP, epigastric pain. Described as burning. Has pacemaker. Hx CHF, A-fib. EMS gave 1 nitro, 324mg  ASA with no marked change.

## 2016-03-28 NOTE — Progress Notes (Signed)
Subjective:    Patient ID: [name], female    DOB: [birth year], 82 y.o.   MRN: [id number]  HPI  [name] is an 82 year old female who presents today for urinary symptoms of dysuria or 4 days. Denies hematuria, flank pain, fever, chills, and sweats. Associated symptoms of frequency and urgency are noted. She is being seen this afternoon for dysuria after being discharged from the emergency room for atypical chest pain. She is being follow by Dr. Haroldine Laws (cardiology) and she has been seen for this same presentation frequently. She was evaluated earlier today and recommended safe for discharge with follow up scheduled. She will also follow up with her PCP for an ED follow up within one week and cardiologist in June. She is not experiencing any chest pain, SOB, or palpitations at this time. She reports that urinary symptoms while present have improved. Earlier visit 02/23/16, she was evaluated for dysuria and urine culture was negative. She states that symptoms improved within 3-4 days after being seen on 02/23/16. She has a history of Stage 4 CKD.  Review of Systems  Constitutional: Negative for fever, chills and fatigue.  Respiratory: Negative for cough and shortness of breath.   Cardiovascular: Negative for chest pain, palpitations and leg swelling.  Gastrointestinal: Negative for nausea, vomiting, abdominal pain and diarrhea.  Genitourinary: Positive for dysuria, urgency and frequency. Negative for hematuria, flank pain and vaginal discharge.  Skin: Negative for rash.  Neurological: Negative for dizziness and headaches.  Psychiatric/Behavioral:       Denies depressed or anxious mood   Past Medical History  Diagnosis Date  . Dyslipidemia   . Hypertension   . Palpitations     PVCs/bigeminy on event in May 2009 revealing this was relatively asymptomatic  . Chest pain     a. 2011 Neg MV;  b. 01/2014 Cath: LM 20-30, LAD nl, D1 nl, LCX nl, OM1 nl, RCA dom 30-5m, PD/PL nl, EF  65%->Med Rx.  . Degenerative joint disease   . Hx of varicella   . History of skin cancer     tafeen/ non melanoma.   . Thrombocytopenia ([place])   . Anemia   . Monoclonal gammopathies   . Right lower quadrant abdominal pain 07/19/2012    Recurrent  With nausea    This is new since she had ct for llq pain in [facility]    R/o appendiceal problem  Hernia  Get ct scan  And plan  Fu     . Diastolic CHF, acute on chronic ([place]) 01/31/2012  . Atrial fibrillation ([place])     a. Dx 01/2014->Eliquis started.  . Cancer ([place])   . Pacemaker   . Dysrhythmia   . Family history of adverse reaction to anesthesia     " multiple family members have difficulty waking "  . GERD (gastroesophageal reflux disease)   . Hypothyroidism      Social History   Social History  . Marital Status: Widowed    Spouse Name: N/A  . Number of Children: 3  . Years of Education: N/A   Occupational History  .      [employer], book keeping   Social History Main Topics  . Smoking status: Never Smoker   . Smokeless tobacco: Never Used  . Alcohol Use: No  . Drug Use: No  . Sexual Activity: No   Other Topics Concern  . 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 Eats with her.      Daughter handles medications. Sees HH and PT once a week.    Past Surgical History  Procedure Laterality Date  . Cholecystectomy  1999  . Cesarean section      times 2  . Shoulder surgery  1996    Right   . Cataract extraction      Bilateral  implantt  . Cardioversion N/A 02/26/2014    Procedure: CARDIOVERSION AT BEDSIDE;  Surgeon: Pixie Casino, MD;  Location: [place];  Service: Cardiovascular;  Laterality: N/A;  . Cardioversion N/A 04/22/2014    Procedure: CARDIOVERSION (BEDSIDE);  Surgeon: [name], MD;  Location: [facility] OR;  Service: Cardiovascular;  Laterality: N/A;  . Pacemaker insertion  04/23/2014    STJ Assurity dual chamber pacemaker implanted by Dr Rayann Heman  . Insert / replace / remove pacemaker    . Esophagogastroduodenoscopy N/A 07/06/2014    Procedure: ESOPHAGOGASTRODUODENOSCOPY (EGD);  Surgeon: Ladene Artist, MD;  Location: [facility] ENDOSCOPY;  Service: Endoscopy;  Laterality: N/A;  . Left heart catheterization with coronary angiogram N/A 01/29/2014    Procedure: LEFT HEART CATHETERIZATION WITH CORONARY ANGIOGRAM;  Surgeon: Blane Ohara, MD;  Location: [facility] CATH LAB;  Service: Cardiovascular;  Laterality: N/A;  . Permanent pacemaker insertion N/A 04/23/2014    Procedure: PERMANENT PACEMAKER INSERTION;  Surgeon: Evans Lance, MD;  Location: [facility] CATH LAB;  Service: Cardiovascular;  Laterality: N/A;  . Right heart catheterization N/A 09/22/2014    Procedure: RIGHT HEART CATH;  Surgeon: Jolaine Artist, MD;  Location: [facility] CATH LAB;  Service: Cardiovascular;  Laterality: N/A;    Family History  Problem Relation Age of Onset  . Coronary artery disease Father   . Heart disease Father   . Lung cancer    . Alzheimer's disease Mother   . Cancer Brother 1    lung cancer  . Heart attack Father     Allergies  Allergen Reactions  . Other Shortness Of Breath    Detergents, perfumes and other strong odor emitting compounds  . Tramadol Shortness Of Breath and Nausea Only  . Ibuprofen Other (See Comments)    Told not to take med    Current Outpatient Prescriptions on File Prior to Visit  Medication Sig Dispense Refill  . acetaminophen (TYLENOL) 325 MG tablet Take 2 tablets (650 mg total) by mouth every 4 (four) hours as needed for headache or mild pain.    Marland Kitchen amiodarone (PACERONE) 200 MG tablet TAKE ONE TABLET BY MOUTH ONCE DAILY 30 tablet 5  . ELIQUIS 2.5 MG TABS tablet TAKE ONE TABLET BY MOUTH TWICE DAILY 60 tablet 6  . Ferrous Sulfate (IRON) 325 (65 Fe) MG TABS TAKE ONE TABLET  BY MOUTH TWICE DAILY WITH A MEAL 180 each 0  . levothyroxine (SYNTHROID, LEVOTHROID) 75 MCG tablet TAKE ONE TABLET BY MOUTH ONCE DAILY 90 tablet 3  . loratadine (CLARITIN) 10 MG tablet Take 10 mg by mouth at bedtime as needed for allergies or rhinitis.     Marland Kitchen meclizine (ANTIVERT) 12.5 MG tablet Take 1 tablet (12.5 mg total) by mouth 2 (two) times daily as needed for dizziness. 10 tablet 0  . metolazone (  ZAROXOLYN) 2.5 MG tablet Take 1 tablet (2.5 mg total) by mouth as needed (for weight 134 lb or greater). 10 tablet 3  . nitroGLYCERIN (NITROSTAT) 0.4 MG SL tablet Place 1 tablet (0.4 mg total) under the tongue every 5 (five) minutes as needed. For chest pain 25 tablet 6  . pantoprazole (PROTONIX) 40 MG tablet TAKE ONE TABLET BY MOUTH ONCE DAILY 30 tablet 5  . potassium chloride SA (K-DUR,KLOR-CON) 20 MEQ tablet Take 3 tablets (60 mEq total) by mouth daily. Take an additional 82meq with Metolazone. 90 tablet 2  . simvastatin (ZOCOR) 10 MG tablet TAKE ONE TABLET BY MOUTH AT BEDTIME 30 tablet 0  . torsemide (DEMADEX) 20 MG tablet Take 2 tablets (40 mg total) by mouth daily. 60 tablet 6   No current facility-administered medications on file prior to visit.    BP 120/70 mmHg  Pulse 69  Wt 138 lb 3.2 oz (62.687 kg)  SpO2 92%       Objective:   Physical Exam  Constitutional: She is oriented to person, place, and time. She appears well-developed and well-nourished.  Eyes: No scleral icterus.  Neck: Neck supple.  Cardiovascular: Normal rate, regular rhythm and intact distal pulses.   Pulmonary/Chest: Effort normal and breath sounds normal. She has no wheezes. She has no rales.  Abdominal: Soft. Bowel sounds are normal. There is no CVA tenderness.  No suprapubic tenderness noted  Lymphadenopathy:    She has no cervical adenopathy.  Neurological: She is alert and oriented to person, place, and time.  Skin: Skin is warm and dry. No rash noted.  Psychiatric: She has a normal mood and affect. Her  behavior is normal. Judgment and thought content normal.       Assessment & Plan:  1. Dysuria UA results WNL. Advised patient to maintain adequate hydration and follow up with PCP in one week for ED follow up. Discussed symptoms to monitor and report such as fever, flank pain, hematuria, increased frequency and urgency.  - POCT Urinalysis Dipstick (Automated)  2. Medically complex patient Discussed importance of PCP follow up from ED within 1 to 2 weeks for management of chronic conditions. Patient voiced understanding and agreed with plan.  Delano Metz, FNP-C

## 2016-03-28 NOTE — Discharge Instructions (Signed)

## 2016-03-28 NOTE — ED Provider Notes (Signed)
CSN: [id number]     Arrival date & time 03/28/16  [id number] History   First MD Initiated Contact with Patient 03/28/16 [phone]     Chief Complaint  Patient presents with  . Chest Pain     (Consider location/radiation/quality/duration/timing/severity/associated sxs/prior Treatment) HPI 82 year old female comes in today complaining of feeling cold in the center of her chest. She states that she woke up short of breath at 05 100 this morning. She describes his anterior chest heaviness. It was present on awakening. She checked her oxygen saturations at home and states that they were "okay". His multiple times in the past. He states that she this alone. She states that then both of her legs began tingling. Her called her daughter told her to call 911. She states that she has gained 3 pounds per our weight here. She was given nitroglycerin and 4 baby aspirin and route. She is not having any pain now. She states it comes and goes cannot site any doctors that cause it, or she denies any headache, neck pain, nausea, vomiting, diarrhea. Her cardiologist is Dr. [place] Pink.  Past Medical History  Diagnosis Date  . Dyslipidemia   . Hypertension   . Palpitations     PVCs/bigeminy on event in May 2009 revealing this was relatively asymptomatic  . Chest pain     a. 2011 Neg MV;  b. 01/2014 Cath: LM 20-30, LAD nl, D1 nl, LCX nl, OM1 nl, RCA dom 30-51m, PD/PL nl, EF 65%->Med Rx.  . Degenerative joint disease   . Hx of varicella   . History of skin cancer     tafeen/ non melanoma.   . Thrombocytopenia ([place])   . Anemia   . Monoclonal gammopathies   . Right lower quadrant abdominal pain 07/19/2012    Recurrent  With nausea    This is new since she had ct for llq pain in [facility]    R/o appendiceal problem  Hernia  Get ct scan  And plan  Fu     . Diastolic CHF, acute on chronic ([place]) 01/31/2012  . Atrial fibrillation ([place])     a. Dx 01/2014->Eliquis started.  . Cancer ([place])   . Pacemaker   . Dysrhythmia   . Family  history of adverse reaction to anesthesia     " multiple family members have difficulty waking "  . GERD (gastroesophageal reflux disease)   . Hypothyroidism    Past Surgical History  Procedure Laterality Date  . Cholecystectomy  1999  . Cesarean section      times 2  . Shoulder surgery  1996    Right   . Cataract extraction      Bilateral  implantt  . Cardioversion N/A 02/26/2014    Procedure: CARDIOVERSION AT BEDSIDE;  Surgeon: Pixie Casino, MD;  Location: [facility];  Service: Cardiovascular;  Laterality: N/A;  . Cardioversion N/A 04/22/2014    Procedure: CARDIOVERSION (BEDSIDE);  Surgeon: [name], MD;  Location: [facility] OR;  Service: Cardiovascular;  Laterality: N/A;  . Pacemaker insertion  04/23/2014    STJ Assurity dual chamber pacemaker implanted by Dr Rayann Heman  . Insert / replace / remove pacemaker    . Esophagogastroduodenoscopy N/A 07/06/2014    Procedure: ESOPHAGOGASTRODUODENOSCOPY (EGD);  Surgeon: Ladene Artist, MD;  Location: [facility] ENDOSCOPY;  Service: Endoscopy;  Laterality: N/A;  . Left heart catheterization with coronary angiogram N/A 01/29/2014    Procedure: LEFT HEART CATHETERIZATION WITH CORONARY ANGIOGRAM;  Surgeon: Blane Ohara, MD;  Location: [facility]  CATH LAB;  Service: Cardiovascular;  Laterality: N/A;  . Permanent pacemaker insertion N/A 04/23/2014    Procedure: PERMANENT PACEMAKER INSERTION;  Surgeon: Evans Lance, MD;  Location: [facility] CATH LAB;  Service: Cardiovascular;  Laterality: N/A;  . Right heart catheterization N/A 09/22/2014    Procedure: RIGHT HEART CATH;  Surgeon: Jolaine Artist, MD;  Location: [facility] CATH LAB;  Service: Cardiovascular;  Laterality: N/A;   Family History  Problem Relation Age of Onset  . Coronary artery disease Father   . Heart disease Father   . Lung cancer    . Alzheimer's disease Mother   . Cancer Brother 44    lung cancer  . Heart attack Father    Social History  Substance Use Topics  . Smoking status: Never Smoker   . Smokeless  tobacco: Never Used  . Alcohol Use: No   OB History    No data available     Review of Systems  All other systems reviewed and are negative.     Allergies  Other; Tramadol; and Ibuprofen  Home Medications   Prior to Admission medications   Medication Sig Start Date End Date Taking? Authorizing Provider  acetaminophen (TYLENOL) 325 MG tablet Take 2 tablets (650 mg total) by mouth every 4 (four) hours as needed for headache or mild pain. 09/26/14   Isaiah Serge, NP  amiodarone (PACERONE) 200 MG tablet TAKE ONE TABLET BY MOUTH ONCE DAILY 11/17/15   Evans Lance, MD  ELIQUIS 2.5 MG TABS tablet TAKE ONE TABLET BY MOUTH TWICE DAILY 03/14/16   Jolaine Artist, MD  Ferrous Sulfate (IRON) 325 (65 Fe) MG TABS TAKE ONE TABLET BY MOUTH TWICE DAILY WITH A MEAL 03/15/16   Burnis Medin, MD  levothyroxine (SYNTHROID, LEVOTHROID) 75 MCG tablet TAKE ONE TABLET BY MOUTH ONCE DAILY 02/29/16   Burnis Medin, MD  loratadine (CLARITIN) 10 MG tablet Take 10 mg by mouth at bedtime as needed for allergies or rhinitis.     Historical Provider, MD  meclizine (ANTIVERT) 12.5 MG tablet Take 1 tablet (12.5 mg total) by mouth 2 (two) times daily as needed for dizziness. 03/09/16   Jolaine Artist, MD  metolazone (ZAROXOLYN) 2.5 MG tablet Take 1 tablet (2.5 mg total) by mouth as needed (for weight 134 lb or greater). 03/09/16   Jolaine Artist, MD  nitroGLYCERIN (NITROSTAT) 0.4 MG SL tablet Place 1 tablet (0.4 mg total) under the tongue every 5 (five) minutes as needed. For chest pain 12/02/13   Fay Records, MD  pantoprazole (PROTONIX) 40 MG tablet TAKE ONE TABLET BY MOUTH ONCE DAILY 10/05/15   Burnis Medin, MD  potassium chloride SA (K-DUR,KLOR-CON) 20 MEQ tablet Take 3 tablets (60 mEq total) by mouth daily. Take an additional 110meq with Metolazone. 03/09/16   Jolaine Artist, MD  simvastatin (ZOCOR) 10 MG tablet TAKE ONE TABLET BY MOUTH AT BEDTIME 03/01/16   Jolaine Artist, MD  torsemide (DEMADEX) 20  MG tablet Take 2 tablets (40 mg total) by mouth daily. 10/28/15   Shaune Pascal Bensimhon, MD   BP 106/55 mmHg  Pulse 70  Temp(Src) 97.8 F (36.6 C) (Oral)  Resp 10  Wt 61.825 kg  SpO2 96% Physical Exam  Constitutional: She is oriented to person, place, and time. She appears well-developed and well-nourished.  HENT:  Head: Normocephalic and atraumatic.  Right Ear: External ear normal.  Left Ear: External ear normal.  Nose: Nose normal.  Mouth/Throat: Oropharynx is  clear and moist.  Eyes: Conjunctivae and EOM are normal. Pupils are equal, round, and reactive to light.  Right pupil slightly smaller than left  Neck: Normal range of motion. Neck supple.  Cardiovascular: Normal rate, regular rhythm, normal heart sounds and intact distal pulses.   Pulmonary/Chest: Effort normal and breath sounds normal.  Abdominal: Soft. Bowel sounds are normal.  Musculoskeletal: Normal range of motion.  Pulses intact, no swelling or discoloration  Neurological: She is alert and oriented to person, place, and time. She has normal reflexes.  Skin: Skin is warm and dry.  Psychiatric: She has a normal mood and affect. Her behavior is normal. Judgment and thought content normal.  Nursing note and vitals reviewed.   ED Course  Procedures (including critical care time) Labs Review Labs Reviewed  BASIC METABOLIC PANEL - Abnormal; Notable for the following:    Glucose, Bld 110 (*)    BUN 36 (*)    Creatinine, Ser 2.01 (*)    GFR calc non Af Amer 21 (*)    GFR calc Af Amer 24 (*)    All other components within normal limits  CBC - Abnormal; Notable for the following:    Platelets 112 (*)    All other components within normal limits  BRAIN NATRIURETIC PEPTIDE - Abnormal; Notable for the following:    B Natriuretic Peptide 289.6 (*)    All other components within normal limits  I-STAT TROPOININ, ED  Randolm Idol, ED    Imaging Review Dg Chest 2 View  03/28/2016  CLINICAL DATA:  Shortness of  breath. EXAM: CHEST  2 VIEW COMPARISON:  03/16/2016. FINDINGS: Cardiac pacer with lead tip in the right atrium and right ventricle. Heart size is stable. Mild increase in bilateral from interstitial prominence with Kerley B-lines noted. Mild congestive heart failure cannot be excluded. Mild bibasilar atelectasis and/or infiltrates. Stable elevation left hemidiaphragm. No pneumothorax . IMPRESSION: 1. Cardiac pacer in stable position. Heart size stable. Mild bilateral pulmonary interstitial prominence with Kerley B-lines. Mild CHF cannot be excluded. 2. Low lung volumes with mild bibasilar atelectasis and/or infiltrates. Electronically Signed   By: Marcello Moores  Register   On: 03/28/2016 08:03   I have personally reviewed and evaluated these images and lab results as part of my medical decision-making.   EKG Interpretation   Date/Time:  Monday Mar 28 2016 07:19:49 EDT Ventricular Rate:  70 PR Interval:  178 QRS Duration: 150 QT Interval:  465 QTC Calculation: 502 R Axis:   124 Text Interpretation:  Normal sinus rhythm Left bundle branch block  Confirmed by Bijon Mineer MD, Cuong Moorman ([id number]) on 03/28/2016 8:22:58 AM      MDM   Final diagnoses:  Atypical chest pain   82 y.o. Female with a fib and chest pain.  Patient with frequent presentation for same and well known to Dr. Sung Amabile.  Discussed with DR. Bensihmon and he advises patient safe for discharge.  Repeat troponin negative.  Patient remains  Pattricia Boss, MD 03/28/16 [phone]

## 2016-03-29 ENCOUNTER — Encounter: Payer: Self-pay | Admitting: Internal Medicine

## 2016-03-29 ENCOUNTER — Other Ambulatory Visit (HOSPITAL_COMMUNITY): Payer: Self-pay | Admitting: Internal Medicine

## 2016-03-29 ENCOUNTER — Telehealth: Payer: Self-pay | Admitting: Internal Medicine

## 2016-03-29 ENCOUNTER — Telehealth: Payer: Self-pay | Admitting: General Practice

## 2016-03-29 ENCOUNTER — Ambulatory Visit (INDEPENDENT_AMBULATORY_CARE_PROVIDER_SITE_OTHER): Payer: Medicare Other | Admitting: Internal Medicine

## 2016-03-29 VITALS — BP 118/78 | HR 70 | Temp 98.5°F | Ht <= 58 in | Wt 138.0 lb

## 2016-03-29 DIAGNOSIS — R202 Paresthesia of skin: Secondary | ICD-10-CM

## 2016-03-29 DIAGNOSIS — IMO0001 Reserved for inherently not codable concepts without codable children: Secondary | ICD-10-CM

## 2016-03-29 DIAGNOSIS — R54 Age-related physical debility: Secondary | ICD-10-CM

## 2016-03-29 DIAGNOSIS — R031 Nonspecific low blood-pressure reading: Secondary | ICD-10-CM

## 2016-03-29 DIAGNOSIS — I5032 Chronic diastolic (congestive) heart failure: Secondary | ICD-10-CM

## 2016-03-29 DIAGNOSIS — Z87898 Personal history of other specified conditions: Secondary | ICD-10-CM | POA: Diagnosis not present

## 2016-03-29 DIAGNOSIS — R200 Anesthesia of skin: Secondary | ICD-10-CM

## 2016-03-29 DIAGNOSIS — Z789 Other specified health status: Secondary | ICD-10-CM

## 2016-03-29 DIAGNOSIS — R0789 Other chest pain: Secondary | ICD-10-CM | POA: Diagnosis not present

## 2016-03-29 NOTE — Patient Instructions (Addendum)
Take you fluid pill. As you planned   Weight is up a bit for you.  Your bp is good today    Try  Short wrist splint for the right wrist incase  The numbness can be   pressure on nerve at night   If doesn't help and continues we can do more evaluation but don't think related to the heart.   if not getting better   We may get  Other specialist to see you.    Will send information  About your  bp readings to Dr B also . To see if he has other ideas  About med adjustment .  I suspect that chest pain could also  be from the check bone areas.  Your lab tests are stable   Chest x ray shows possible mild CHF .

## 2016-03-29 NOTE — Telephone Encounter (Signed)
Patient has appointment today @ 3:30 with Dr. Regis Bill to address concerns.
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
## 2016-03-29 NOTE — Telephone Encounter (Signed)
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 Call [facility] # [phone]   Nurse Assessment  Nurse: Mallie Mussel, RN, Alveta Heimlich Date/Time [name] Time): 03/29/2016 12:12:45 PM  Confirm and document reason for call. If symptomatic, describe symptoms. You must click the next button to save text entered. ---Caller states that the patient is weak and dizzy beginning yesterday when she had chest pain. Denies chest pain at present. Was seen in ER yesterday for the chest pain. BP was 94-52 about 1/2 hour ago. She is hydrated. She is able to stand and walk with her rolling walker which is the norm for her. She is weaker today. She barely made it 10 feet today.  Has the patient traveled out of the country within the last 30 days? ---No  Does the patient have any new or worsening symptoms? ---Yes  Will a triage be completed? ---Yes  Related visit to physician within the last 2 weeks? ---Yes  Does the PT have any chronic conditions? (i.e. diabetes, asthma, etc.) ---Yes  List chronic conditions. ---AFib with Pacemaker, HTN, Diastolic CHF - SOB with exertion which is worse than before. Stage IV Kidney disease.  Is this a behavioral health or substance abuse call? ---No     Guidelines    Guideline Title Affirmed Question Affirmed Notes  Low Blood Pressure [id number] Fall in systolic BP > 20 mm Hg from normal AND [2] dizzy, lightheaded, or weak    Final Disposition User   Go to ED Now (or PCP triage) Mallie Mussel, RN, Alveta Heimlich    Comments  Her norm is usually 118-153 in the last 2 weeks.  I scheduled her for 30 minutes with Dr. Shanon Ace at 3:30pm today. I scheduled her for 30 minutes due to her health history and she had chest pain yesterday.   Referrals  [facility]   Disagree/Comply: Comply

## 2016-03-31 ENCOUNTER — Observation Stay (HOSPITAL_COMMUNITY)
Admission: EM | Admit: 2016-03-31 | Discharge: 2016-04-01 | Disposition: A | Discharge status: Home / Self Care | Payer: Medicare Other | Attending: Cardiology | Admitting: Cardiology

## 2016-03-31 ENCOUNTER — Encounter (HOSPITAL_COMMUNITY): Payer: Self-pay | Admitting: Neurology

## 2016-03-31 ENCOUNTER — Emergency Department (HOSPITAL_COMMUNITY): Payer: Medicare Other

## 2016-03-31 DIAGNOSIS — Z7901 Long term (current) use of anticoagulants: Secondary | ICD-10-CM | POA: Insufficient documentation

## 2016-03-31 DIAGNOSIS — Z950 Presence of cardiac pacemaker: Secondary | ICD-10-CM | POA: Insufficient documentation

## 2016-03-31 DIAGNOSIS — R778 Other specified abnormalities of plasma proteins: Secondary | ICD-10-CM | POA: Diagnosis present

## 2016-03-31 DIAGNOSIS — E785 Hyperlipidemia, unspecified: Secondary | ICD-10-CM | POA: Insufficient documentation

## 2016-03-31 DIAGNOSIS — I480 Paroxysmal atrial fibrillation: Secondary | ICD-10-CM | POA: Insufficient documentation

## 2016-03-31 DIAGNOSIS — R7989 Other specified abnormal findings of blood chemistry: Secondary | ICD-10-CM | POA: Diagnosis not present

## 2016-03-31 DIAGNOSIS — K219 Gastro-esophageal reflux disease without esophagitis: Secondary | ICD-10-CM | POA: Diagnosis not present

## 2016-03-31 DIAGNOSIS — I130 Hypertensive heart and chronic kidney disease with heart failure and stage 1 through stage 4 chronic kidney disease, or unspecified chronic kidney disease: Secondary | ICD-10-CM | POA: Insufficient documentation

## 2016-03-31 DIAGNOSIS — N184 Chronic kidney disease, stage 4 (severe): Secondary | ICD-10-CM | POA: Diagnosis not present

## 2016-03-31 DIAGNOSIS — I5032 Chronic diastolic (congestive) heart failure: Secondary | ICD-10-CM | POA: Diagnosis not present

## 2016-03-31 DIAGNOSIS — R0602 Shortness of breath: Secondary | ICD-10-CM | POA: Diagnosis not present

## 2016-03-31 DIAGNOSIS — D509 Iron deficiency anemia, unspecified: Secondary | ICD-10-CM | POA: Insufficient documentation

## 2016-03-31 DIAGNOSIS — R079 Chest pain, unspecified: Secondary | ICD-10-CM

## 2016-03-31 DIAGNOSIS — I482 Chronic atrial fibrillation: Secondary | ICD-10-CM | POA: Insufficient documentation

## 2016-03-31 DIAGNOSIS — E039 Hypothyroidism, unspecified: Secondary | ICD-10-CM | POA: Insufficient documentation

## 2016-03-31 DIAGNOSIS — I2510 Atherosclerotic heart disease of native coronary artery without angina pectoris: Secondary | ICD-10-CM | POA: Insufficient documentation

## 2016-03-31 DIAGNOSIS — I10 Essential (primary) hypertension: Secondary | ICD-10-CM | POA: Diagnosis present

## 2016-03-31 DIAGNOSIS — Z79899 Other long term (current) drug therapy: Secondary | ICD-10-CM | POA: Diagnosis not present

## 2016-03-31 DIAGNOSIS — I119 Hypertensive heart disease without heart failure: Secondary | ICD-10-CM

## 2016-03-31 DIAGNOSIS — R748 Abnormal levels of other serum enzymes: Secondary | ICD-10-CM | POA: Diagnosis not present

## 2016-03-31 LAB — HEPATIC FUNCTION PANEL
ALBUMIN: 3.3 g/dL — AB (ref 3.5–5.0)
ALT: 19 U/L (ref 14–54)
AST: 19 U/L (ref 15–41)
Alkaline Phosphatase: 71 U/L (ref 38–126)
Bilirubin, Direct: 0.2 mg/dL (ref 0.1–0.5)
Indirect Bilirubin: 0.8 mg/dL (ref 0.3–0.9)
TOTAL PROTEIN: 6.7 g/dL (ref 6.5–8.1)
Total Bilirubin: 1 mg/dL (ref 0.3–1.2)

## 2016-03-31 LAB — MAGNESIUM: MAGNESIUM: 2.3 mg/dL (ref 1.7–2.4)

## 2016-03-31 LAB — I-STAT TROPONIN, ED
TROPONIN I, POC: 0.04 ng/mL (ref 0.00–0.08)
TROPONIN I, POC: 0.03 ng/mL (ref 0.00–0.08)

## 2016-03-31 LAB — BASIC METABOLIC PANEL
ANION GAP: 12 (ref 5–15)
BUN: 34 mg/dL — ABNORMAL HIGH (ref 6–20)
CALCIUM: 9.9 mg/dL (ref 8.9–10.3)
CO2: 30 mmol/L (ref 22–32)
Chloride: 100 mmol/L — ABNORMAL LOW (ref 101–111)
Creatinine, Ser: 2.3 mg/dL — ABNORMAL HIGH (ref 0.44–1.00)
GFR calc non Af Amer: 18 mL/min — ABNORMAL LOW (ref >60)
GFR, EST AFRICAN AMERICAN: 21 mL/min — AB (ref >60)
Glucose, Bld: 115 mg/dL — ABNORMAL HIGH (ref 65–99)
Potassium: 3.7 mmol/L (ref 3.5–5.1)
SODIUM: 142 mmol/L (ref 135–145)

## 2016-03-31 LAB — CBC
HEMATOCRIT: 41 % (ref 36.0–46.0)
HEMOGLOBIN: 13.2 g/dL (ref 12.0–15.0)
MCH: 30.3 pg (ref 26.0–34.0)
MCHC: 32.2 g/dL (ref 30.0–36.0)
MCV: 94.3 fL (ref 78.0–100.0)
Platelets: 137 10*3/uL — ABNORMAL LOW (ref 150–400)
RBC: 4.35 MIL/uL (ref 3.87–5.11)
RDW: 14.3 % (ref 11.5–15.5)
WBC: 5.4 10*3/uL (ref 4.0–10.5)

## 2016-03-31 LAB — TROPONIN I: TROPONIN I: 0.04 ng/mL — AB (ref <0.031)

## 2016-03-31 LAB — TSH: TSH: 1.472 u[IU]/mL (ref 0.350–4.500)

## 2016-03-31 LAB — BRAIN NATRIURETIC PEPTIDE: B NATRIURETIC PEPTIDE 5: 202.3 pg/mL — AB (ref 0.0–100.0)

## 2016-03-31 MED ORDER — SODIUM CHLORIDE 0.9% FLUSH
3.0000 mL | Freq: Two times a day (BID) | INTRAVENOUS | Status: DC
  Administered 2016-03-31 – 2016-04-01 (×2): 3 mL via INTRAVENOUS

## 2016-03-31 MED ORDER — PANTOPRAZOLE SODIUM 40 MG PO TBEC
40.0000 mg | DELAYED_RELEASE_TABLET | Freq: Two times a day (BID) | ORAL | Status: DC
  Administered 2016-03-31 – 2016-04-01 (×2): 40 mg via ORAL
  Filled 2016-03-31 (×2): qty 1

## 2016-03-31 MED ORDER — NITROGLYCERIN 0.4 MG SL SUBL: 0.4000 mg | SUBLINGUAL_TABLET | SUBLINGUAL | Status: DC | PRN

## 2016-03-31 MED ORDER — SODIUM CHLORIDE 0.9 % IV SOLN: 250.0000 mL | INTRAVENOUS | Status: DC | PRN

## 2016-03-31 MED ORDER — APIXABAN 2.5 MG PO TABS
2.5000 mg | ORAL_TABLET | Freq: Two times a day (BID) | ORAL | Status: DC
  Administered 2016-03-31 – 2016-04-01 (×2): 2.5 mg via ORAL
  Filled 2016-03-31 (×2): qty 1

## 2016-03-31 MED ORDER — ONDANSETRON HCL 4 MG/2ML IJ SOLN: 4.0000 mg | Freq: Four times a day (QID) | INTRAMUSCULAR | Status: DC | PRN

## 2016-03-31 MED ORDER — ASPIRIN 81 MG PO CHEW: 324.0000 mg | CHEWABLE_TABLET | Freq: Once | ORAL | Status: DC

## 2016-03-31 MED ORDER — MECLIZINE HCL 25 MG PO TABS: 12.5000 mg | ORAL_TABLET | Freq: Two times a day (BID) | ORAL | Status: DC | PRN

## 2016-03-31 MED ORDER — ACETAMINOPHEN 325 MG PO TABS: 650.0000 mg | ORAL_TABLET | ORAL | Status: DC | PRN

## 2016-03-31 MED ORDER — NITROGLYCERIN 0.4 MG SL SUBL
0.4000 mg | SUBLINGUAL_TABLET | SUBLINGUAL | Status: DC | PRN
  Administered 2016-04-01 (×2): 0.4 mg via SUBLINGUAL
  Filled 2016-03-31 (×2): qty 1

## 2016-03-31 MED ORDER — AMIODARONE HCL 200 MG PO TABS
200.0000 mg | ORAL_TABLET | Freq: Every day | ORAL | Status: DC
  Administered 2016-04-01: 200 mg via ORAL
  Filled 2016-03-31: qty 1

## 2016-03-31 MED ORDER — LEVOTHYROXINE SODIUM 75 MCG PO TABS
75.0000 ug | ORAL_TABLET | Freq: Every day | ORAL | Status: DC
  Administered 2016-04-01: 75 ug via ORAL
  Filled 2016-03-31: qty 1

## 2016-03-31 MED ORDER — ACETAMINOPHEN 325 MG PO TABS
650.0000 mg | ORAL_TABLET | ORAL | Status: DC | PRN
  Administered 2016-04-01: 650 mg via ORAL
  Filled 2016-03-31: qty 2

## 2016-03-31 MED ORDER — TORSEMIDE 20 MG PO TABS
40.0000 mg | ORAL_TABLET | Freq: Every day | ORAL | Status: DC
  Filled 2016-03-31: qty 2

## 2016-03-31 MED ORDER — SIMVASTATIN 10 MG PO TABS
10.0000 mg | ORAL_TABLET | Freq: Every day | ORAL | Status: DC
  Administered 2016-03-31: 10 mg via ORAL
  Filled 2016-03-31: qty 1

## 2016-03-31 MED ORDER — POTASSIUM CHLORIDE CRYS ER 20 MEQ PO TBCR
20.0000 meq | EXTENDED_RELEASE_TABLET | Freq: Every day | ORAL | Status: DC
  Administered 2016-04-01: 20 meq via ORAL
  Filled 2016-03-31: qty 1

## 2016-03-31 MED ORDER — ALPRAZOLAM 0.25 MG PO TABS: 0.2500 mg | ORAL_TABLET | Freq: Two times a day (BID) | ORAL | Status: DC | PRN

## 2016-03-31 MED ORDER — LORATADINE 10 MG PO TABS: 10.0000 mg | ORAL_TABLET | Freq: Every evening | ORAL | Status: DC | PRN

## 2016-03-31 MED ORDER — SODIUM CHLORIDE 0.9% FLUSH: 3.0000 mL | INTRAVENOUS | Status: DC | PRN

## 2016-03-31 NOTE — ED Provider Notes (Signed)
CSN: [id number]     Arrival date & time 03/31/16  1255 History   First MD Initiated Contact with Patient 03/31/16 1300     Chief Complaint  Patient presents with  . Chest Pain     (Consider location/radiation/quality/duration/timing/severity/associated sxs/prior Treatment) Patient is a 82 y.o. female presenting with chest pain.  Chest Pain Pain location:  Substernal area Pain quality: burning and pressure   Pain radiates to:  Does not radiate Pain radiates to the back: no   Pain severity:  Moderate Onset quality:  Sudden Duration:  2 days Timing:  Constant Progression:  Worsening Chronicity:  New Relieved by:  Nothing Worsened by:  Nothing tried Ineffective treatments:  None tried Associated symptoms: no dizziness, no fever, no headache, no nausea, no palpitations, no shortness of breath and not vomiting   Risk factors: no coronary artery disease    82 yo F with chest pain.  Started about 3 hours ago. Feels like her prior chest pain events. Patient has been seen 4 times in the past couple months at same. Has had a heart cath that showed 30-40% stenosis in the left circumflex but no other disease. Feels like a pressure on her chest has some burning with it. These events come and go currently is having some chest pressure. Has some shortness of breath with it. Denies diaphoresis nausea vomiting. Has some numbness to her left arm denies other radiation. Denies abdominal tenderness.  Past Medical History  Diagnosis Date  . Dyslipidemia   . Hypertension   . Palpitations     PVCs/bigeminy on event in May 2009 revealing this was relatively asymptomatic  . Chest pain     a. 2011 Neg MV;  b. 01/2014 Cath: LM 20-30, LAD nl, D1 nl, LCX nl, OM1 nl, RCA dom 30-38m, PD/PL nl, EF 65%->Med Rx; c. 03/2016 MV: no ischemia/infarct. EF 63%.  . Degenerative joint disease   . Hx of varicella   . History of skin cancer     tafeen/ non melanoma.   . Thrombocytopenia ([place])   . Anemia   . Monoclonal  gammopathies   . Right lower quadrant abdominal pain 07/19/2012    Recurrent  With nausea    This is new since she had ct for llq pain in [facility]    R/o appendiceal problem  Hernia  Get ct scan  And plan  Fu     . Diastolic CHF, acute on chronic ([place]) 01/31/2012  . Atrial fibrillation ([place])     a. Dx 01/2014->Eliquis started.  . Cancer ([place])   . Pacemaker   . Dysrhythmia   . Family history of adverse reaction to anesthesia     " multiple family members have difficulty waking "  . GERD (gastroesophageal reflux disease)   . Hypothyroidism    Past Surgical History  Procedure Laterality Date  . Cholecystectomy  1999  . Cesarean section      times 2  . Shoulder surgery  1996    Right   . Cataract extraction      Bilateral  implantt  . Cardioversion N/A 02/26/2014    Procedure: CARDIOVERSION AT BEDSIDE;  Surgeon: Pixie Casino, MD;  Location: [place];  Service: Cardiovascular;  Laterality: N/A;  . Cardioversion N/A 04/22/2014    Procedure: CARDIOVERSION (BEDSIDE);  Surgeon: [name], MD;  Location: [facility] OR;  Service: Cardiovascular;  Laterality: N/A;  . Pacemaker insertion  04/23/2014    STJ Assurity dual chamber pacemaker implanted by Dr Rayann Heman  .  Insert / replace / remove pacemaker    . Esophagogastroduodenoscopy N/A 07/06/2014    Procedure: ESOPHAGOGASTRODUODENOSCOPY (EGD);  Surgeon: Ladene Artist, MD;  Location: [facility] ENDOSCOPY;  Service: Endoscopy;  Laterality: N/A;  . Left heart catheterization with coronary angiogram N/A 01/29/2014    Procedure: LEFT HEART CATHETERIZATION WITH CORONARY ANGIOGRAM;  Surgeon: Blane Ohara, MD;  Location: [facility] CATH LAB;  Service: Cardiovascular;  Laterality: N/A;  . Permanent pacemaker insertion N/A 04/23/2014    Procedure: PERMANENT PACEMAKER INSERTION;  Surgeon: Evans Lance, MD;  Location: [facility] CATH LAB;  Service: Cardiovascular;  Laterality: N/A;  . Right heart catheterization N/A 09/22/2014    Procedure: RIGHT HEART CATH;  Surgeon: Jolaine Artist,  MD;  Location: [facility] CATH LAB;  Service: Cardiovascular;  Laterality: N/A;   Family History  Problem Relation Age of Onset  . Coronary artery disease Father   . Heart disease Father   . Lung cancer    . Alzheimer's disease Mother   . Cancer Brother 50    lung cancer  . Heart attack Father    Social History  Substance Use Topics  . Smoking status: Never Smoker   . Smokeless tobacco: Never Used  . Alcohol Use: No   OB History    No data available     Review of Systems  Constitutional: Negative for fever and chills.  HENT: Negative for congestion and rhinorrhea.   Eyes: Negative for redness and visual disturbance.  Respiratory: Negative for shortness of breath and wheezing.   Cardiovascular: Negative for chest pain and palpitations.  Gastrointestinal: Negative for nausea and vomiting.  Genitourinary: Negative for dysuria and urgency.  Musculoskeletal: Negative for myalgias and arthralgias.  Skin: Negative for pallor and wound.  Neurological: Negative for dizziness and headaches.      Allergies  Other; Tramadol; and Ibuprofen  Home Medications   Prior to Admission medications   Medication Sig Start Date End Date Taking? Authorizing Provider  acetaminophen (TYLENOL) 325 MG tablet Take 2 tablets (650 mg total) by mouth every 4 (four) hours as needed for headache or mild pain. 09/26/14  Yes Isaiah Serge, NP  amiodarone (PACERONE) 200 MG tablet TAKE ONE TABLET BY MOUTH ONCE DAILY 11/17/15  Yes Evans Lance, MD  ELIQUIS 2.5 MG TABS tablet TAKE ONE TABLET BY MOUTH TWICE DAILY 03/14/16  Yes Jolaine Artist, MD  levothyroxine (SYNTHROID, LEVOTHROID) 75 MCG tablet TAKE ONE TABLET BY MOUTH ONCE DAILY 02/29/16  Yes Burnis Medin, MD  loratadine (CLARITIN) 10 MG tablet Take 10 mg by mouth at bedtime as needed for allergies or rhinitis.    Yes Historical Provider, MD  meclizine (ANTIVERT) 12.5 MG tablet Take 1 tablet (12.5 mg total) by mouth 2 (two) times daily as needed for  dizziness. 03/09/16  Yes Jolaine Artist, MD  metolazone (ZAROXOLYN) 2.5 MG tablet Take 1 tablet (2.5 mg total) by mouth as needed (for weight 134 lb or greater). 03/09/16  Yes Jolaine Artist, MD  nitroGLYCERIN (NITROSTAT) 0.4 MG SL tablet Place 1 tablet (0.4 mg total) under the tongue every 5 (five) minutes as needed. For chest pain 12/02/13  Yes Fay Records, MD  simvastatin (ZOCOR) 10 MG tablet TAKE ONE TABLET BY MOUTH AT BEDTIME 03/30/16  Yes Jolaine Artist, MD  Ferrous Sulfate (IRON) 325 (65 Fe) MG TABS Take 1 tablet by mouth 2 (two) times daily with a meal. 04/08/16   Isaiah Serge, NP  pantoprazole (PROTONIX) 40 MG tablet  Take 1 tablet (40 mg total) by mouth 2 (two) times daily. For 1 week then go back to 1 daily. 04/01/16   Isaiah Serge, NP  potassium chloride SA (K-DUR,KLOR-CON) 20 MEQ tablet Take 3 tablets (60 mEq total) by mouth daily. Take an additional 44meq with Metolazone. 04/03/16   Isaiah Serge, NP  torsemide (DEMADEX) 20 MG tablet Take 2 tablets (40 mg total) by mouth daily. 04/03/16   Isaiah Serge, NP   BP 148/71 mmHg  Pulse 75  Temp(Src) 98 F (36.7 C) (Oral)  Resp 16  Ht 4\' 10"  (1.473 m)  Wt 129 lb 1.6 oz (58.559 kg)  BMI 26.99 kg/m2  SpO2 96% Physical Exam  Constitutional: She is oriented to person, place, and time. She appears well-developed and well-nourished. No distress.  HENT:  Head: Normocephalic and atraumatic.  Eyes: EOM are normal. Pupils are equal, round, and reactive to light.  Neck: Normal range of motion. Neck supple.  Cardiovascular: Normal rate and regular rhythm.  Exam reveals no gallop and no friction rub.   No murmur heard. Pulmonary/Chest: Effort normal. She has no wheezes. She has no rales.  Abdominal: Soft. She exhibits no distension. There is no tenderness.  Musculoskeletal: She exhibits no edema or tenderness.  Neurological: She is alert and oriented to person, place, and time.  Skin: Skin is warm and dry. She is not diaphoretic.   Psychiatric: She has a normal mood and affect. Her behavior is normal.  Nursing note and vitals reviewed.   ED Course  Procedures (including critical care time) Labs Review Labs Reviewed  CBC - Abnormal; Notable for the following:    Platelets 137 (*)    All other components within normal limits  BASIC METABOLIC PANEL - Abnormal; Notable for the following:    Chloride 100 (*)    Glucose, Bld 115 (*)    BUN 34 (*)    Creatinine, Ser 2.30 (*)    GFR calc non Af Amer 18 (*)    GFR calc Af Amer 21 (*)    All other components within normal limits  HEPATIC FUNCTION PANEL - Abnormal; Notable for the following:    Albumin 3.3 (*)    All other components within normal limits  TROPONIN I - Abnormal; Notable for the following:    Troponin I 0.04 (*)    All other components within normal limits  TROPONIN I - Abnormal; Notable for the following:    Troponin I 0.04 (*)    All other components within normal limits  BRAIN NATRIURETIC PEPTIDE - Abnormal; Notable for the following:    B Natriuretic Peptide 202.3 (*)    All other components within normal limits  CBC - Abnormal; Notable for the following:    Platelets 125 (*)    All other components within normal limits  TROPONIN I - Abnormal; Notable for the following:    Troponin I 0.04 (*)    All other components within normal limits  BASIC METABOLIC PANEL - Abnormal; Notable for the following:    Potassium 3.2 (*)    Chloride 95 (*)    Glucose, Bld 162 (*)    BUN 35 (*)    Creatinine, Ser 2.31 (*)    GFR calc non Af Amer 18 (*)    GFR calc Af Amer 20 (*)    All other components within normal limits  MAGNESIUM  TSH  I-STAT TROPOININ, ED  I-STAT TROPOININ, ED    Imaging Review Dg Chest  2 View  03/31/2016  CLINICAL DATA:  Chest pain starting this morning, left arm numbness, chest burning. History of hypertension. EXAM: CHEST  2 VIEW COMPARISON:  Chest x-rays dated 03/28/2016 and 03/16/2016. FINDINGS: Heart size is mildly  prominent, stable. Overall cardiomediastinal silhouette is stable in size and configuration. Right chest wall pacemaker appears stable in position. There is a chronic mild central pulmonary vascular congestion. Coarse interstitial markings again seen within both lungs, also not significantly changed compared to multiple prior exams, suggesting a chronic mild CHF/volume overload. No new lung findings. No evidence of pneumonia. No pleural effusion or pneumothorax seen. Degenerative changes again noted within the thoracic spine. Chronic compression deformities again appreciated at the thoracolumbar junction, better seen on previous exams. No acute- appearing osseous abnormality appreciated IMPRESSION: 1. Chronic mild central pulmonary vascular congestion and interstitial thickening/edema, suggesting a chronic mild CHF/volume overload. 2. No acute findings. Electronically Signed   By: Franki Cabot M.D.   On: 03/31/2016 14:25   Nm Myocar Multi W/spect W/wall Motion / Ef  04/01/2016  CLINICAL DATA:  Annual female with chest pain. Elevated troponin. Congestive heart failure. With EXAM: MYOCARDIAL IMAGING WITH SPECT (REST AND PHARMACOLOGIC-STRESS) GATED LEFT VENTRICULAR WALL MOTION STUDY LEFT VENTRICULAR EJECTION FRACTION TECHNIQUE: Standard myocardial SPECT imaging was performed after resting intravenous injection of 10 mCi Tc-3m tetrofosmin. Subsequently, intravenous infusion of Lexiscan was performed under the supervision of the Cardiology staff. At peak effect of the drug, 30 mCi Tc-40m tetrofosmin was injected intravenously and standard myocardial SPECT imaging was performed. Quantitative gated imaging was also performed to evaluate left ventricular wall motion, and estimate left ventricular ejection fraction. COMPARISON:  None new FINDINGS: EKG:  ST depression in leads 2, 3 and avF during the stress portion Perfusion: No decreased activity in the left ventricle on stress imaging to suggest reversible ischemia or  infarction. There is decrease counts in the anterior septal wall on stress imaging which may relate to RV insertion. The septum is truncated on stress and rest. Wall Motion: Normal left ventricular wall motion. No left ventricular dilation. Left Ventricular Ejection Fraction: 63 % End diastolic volume 88 ml End systolic volume 33 ml IMPRESSION: 1. No reversible ischemia or infarction. 2. Normal left ventricular wall motion. 3. Left ventricular ejection fraction 63% 4. Non invasive risk stratification*: Low *2012 Appropriate Use Criteria for Coronary Revascularization Focused Update: J Am Coll Cardiol. [id number]. http://content.[email or web address]?articleid=[email or web address] Electronically Signed   By: Suzy Bouchard M.D.   On: 04/01/2016 13:14   I have personally reviewed and evaluated these images and lab results as part of my medical decision-making.   EKG Interpretation   Date/Time:  Thursday Mar 31 2016 13:16:48 EDT Ventricular Rate:  70 PR Interval:  182 QRS Duration: 153 QT Interval:  479 QTC Calculation: 517 R Axis:   87 Text Interpretation:  Sinus rhythm Left bundle branch block Prolonged QT  interval Confirmed by Ashok Cordia  MD, Lennette Bihari ([id number]) on 03/31/2016 6:50:23 PM      MDM   Final diagnoses:  Chest pain at rest    82 yo F with a chief complaint chest pain. This started about 3 hours ago. Similar to her past chest pain events. Her EKG today is concerning for possible elevation in the anterior leads with decreasing of the slope. Patient does not appear to be in extremis. Will discuss the EKG with cardiology.  Dr. Aundra Dubin, feels changes likely related to large Q waves.  Will come and eval patient. Will admit.  The patients results and  plan were reviewed and discussed.   Any x-rays performed were independently reviewed by myself.   Differential diagnosis were considered with the presenting HPI.  Medications  simvastatin (ZOCOR) tablet 10 mg (10 mg Oral Given 03/31/16  2156)  apixaban (ELIQUIS) tablet 2.5 mg (2.5 mg Oral Given 04/01/16 1214)  meclizine (ANTIVERT) tablet 12.5 mg (not administered)  levothyroxine (SYNTHROID, LEVOTHROID) tablet 75 mcg (75 mcg Oral Given 04/01/16 0635)  amiodarone (PACERONE) tablet 200 mg (200 mg Oral Given 04/01/16 1215)  pantoprazole (PROTONIX) EC tablet 40 mg (40 mg Oral Given 04/01/16 1214)  acetaminophen (TYLENOL) tablet 650 mg (650 mg Oral Given 04/01/16 1223)  loratadine (CLARITIN) tablet 10 mg (not administered)  nitroGLYCERIN (NITROSTAT) SL tablet 0.4 mg (0.4 mg Sublingual Given 04/01/16 0650)  ondansetron (ZOFRAN) injection 4 mg (not administered)  sodium chloride flush (NS) 0.9 % injection 3 mL (3 mLs Intravenous Given 04/01/16 1000)  sodium chloride flush (NS) 0.9 % injection 3 mL (not administered)  0.9 %  sodium chloride infusion (not administered)  ALPRAZolam (XANAX) tablet 0.25 mg (not administered)  potassium chloride SA (K-DUR,KLOR-CON) CR tablet 20 mEq (20 mEq Oral Given 04/01/16 1215)  potassium chloride SA (K-DUR,KLOR-CON) CR tablet 20 mEq (20 mEq Oral Not Given 04/01/16 1545)  regadenoson (LEXISCAN) injection SOLN 0.4 mg (0.4 mg Intravenous Given 04/01/16 1111)  regadenoson (LEXISCAN) 0.4 MG/5ML injection SOLN (  Not Given 04/01/16 0945)  technetium tetrofosmin (TC-MYOVIEW) injection 10 milli Curie (10 milli Curies Intravenous Contrast Given 04/01/16 1010)  technetium tetrofosmin (TC-MYOVIEW) injection 30 milli Curie (30 milli Curies Intravenous Contrast Given 04/01/16 1233)    Filed Vitals:   04/01/16 1114 04/01/16 1116 04/01/16 1118 04/01/16 1215  BP: 94/55 85/53 89/52  148/71  Pulse: 78 74  75  Temp:    98 F (36.7 C)  TempSrc:    Oral  Resp:      Height:      Weight:      SpO2:    96%    Final diagnoses:  Chest pain at rest    Admission/ observation were discussed with the admitting physician, patient and/or family and they are comfortable with the plan.    Deno Etienne, DO 04/01/16 1956

## 2016-03-31 NOTE — ED Notes (Signed)
Pts name moved to another room in error. Pt has remained in [place] and is currently in [place]. NAD.

## 2016-03-31 NOTE — ED Notes (Signed)
Report attempted x2

## 2016-03-31 NOTE — ED Notes (Signed)
Report attempted 

## 2016-03-31 NOTE — ED Notes (Signed)
Per ems- Pt comes from home c/o cp since 0630, left sided is intermittent with numbness to left arm. Took 2 nitro, 324 aspirin. No relief of pain 5/10 at current. Is a x 4, BP 136/70, HR 86.

## 2016-03-31 NOTE — H&P (Signed)
[name] is an 82 y.o. female.    Primary Cardiologist:Dr. Harrington Challenger and Dr. Haroldine Laws  EP: Dr. Lovena Le PCP: [name], MD  Chief Complaint: chest pain  HPI: 82 year old female with history of nonobstructive CAD by cardiac catheterization in 01/2014, paroxsymal  atrial fibrillation, (Peviously thought to have failed amiodarone and had chronic AF. However in November was AV pacing) - on amiodarone 200 mg daily. ON Eliquis 2.5 mg twice a day diastolic CHF, HTN, HL, anemia, and CKD. Patient was admitted 04/2014 x2 with acute on chronic diastolic CHF. She underwent cardioversion with restoration of NSR. This resulted in profound bradycardia and hypotension requiring pacemaker implantation. Pacemaker implant was complicated by acute blood loss anemia requiring transfusion with PRBCs.  Echo 01/22/16 LVEF 60-65%, grade 2 DD, severe MR and TR,  baseline BNP 200.   Last seen by Dr. Haroldine Laws 03/09/16 wt then 136.  He stated she had a very narrow euvolemic window.  NYHA II-III.      07/06/14 :  EGD with only mild gastritis and small HH  Since then pt has been seen in ER 03/16/16 NTG with not much help and neg troponins and 03/28/16 for chest pain- ant chest heaviness, feeling cold in center of her chest and SOB.  Ntg may have relieved pain then with neg troponins.  Has been seen in [facility] office twice as well.  In 01/2016 has similar complaints ruled out acute coronary syndrome, esophageal dysmotility, normal esophagogram.  Today presents to ER with chest pain since 0630.  Also with lt arm intermittent numbness. And Lt side.  2 NTG without relief. Her pain is pressure initially then more of a burning pain.  Pain is worse with lying down.  Sometimes lasts 5 min other times 45 min.  No nausea or SOB, no diaphoresis.   EKG with SR LBBB may be AV pacing .   Cr. 1.9 to 2.3 a little increased 2.1 was previous pk.  Troponin 0.03--> 0.04  Currently BP stable.  Pt pain free.  She wants to get to the  bottom of her symptoms.    Past Medical History  Diagnosis Date  . Dyslipidemia   . Hypertension   . Palpitations     PVCs/bigeminy on event in May 2009 revealing this was relatively asymptomatic  . Chest pain     a. 2011 Neg MV;  b. 01/2014 Cath: LM 20-30, LAD nl, D1 nl, LCX nl, OM1 nl, RCA dom 30-31m PD/PL nl, EF 65%->Med Rx.  . Degenerative joint disease   . Hx of varicella   . History of skin cancer     tafeen/ non melanoma.   . Thrombocytopenia ([place]   . Anemia   . Monoclonal gammopathies   . Right lower quadrant abdominal pain 07/19/2012    Recurrent  With nausea    This is new since she had ct for llq pain in [facility]    R/o appendiceal problem  Hernia  Get ct scan  And plan  Fu     . Diastolic CHF, acute on chronic ([place] 01/31/2012  . Atrial fibrillation ([place]     a. Dx 01/2014->Eliquis started.  . Cancer ([place]   . Pacemaker   . Dysrhythmia   . Family history of adverse reaction to anesthesia     " multiple family members have difficulty waking "  . GERD (gastroesophageal reflux disease)   . Hypothyroidism     Past Surgical History  Procedure Laterality Date  . Cholecystectomy  1999  . Cesarean section      times 2  . Shoulder surgery  1996    Right   . Cataract extraction      Bilateral  implantt  . Cardioversion N/A 02/26/2014    Procedure: CARDIOVERSION AT BEDSIDE;  Surgeon: Pixie Casino, MD;  Location: [place];  Service: Cardiovascular;  Laterality: N/A;  . Cardioversion N/A 04/22/2014    Procedure: CARDIOVERSION (BEDSIDE);  Surgeon: [name], MD;  Location: [facility] OR;  Service: Cardiovascular;  Laterality: N/A;  . Pacemaker insertion  04/23/2014    STJ Assurity dual chamber pacemaker implanted by Dr Rayann Heman  . Insert / replace / remove pacemaker    . Esophagogastroduodenoscopy N/A 07/06/2014    Procedure: ESOPHAGOGASTRODUODENOSCOPY (EGD);  Surgeon: Ladene Artist, MD;  Location: [facility] ENDOSCOPY;  Service: Endoscopy;  Laterality: N/A;  . Left heart catheterization  with coronary angiogram N/A 01/29/2014    Procedure: LEFT HEART CATHETERIZATION WITH CORONARY ANGIOGRAM;  Surgeon: Blane Ohara, MD;  Location: [facility] CATH LAB;  Service: Cardiovascular;  Laterality: N/A;  . Permanent pacemaker insertion N/A 04/23/2014    Procedure: PERMANENT PACEMAKER INSERTION;  Surgeon: Evans Lance, MD;  Location: [facility] CATH LAB;  Service: Cardiovascular;  Laterality: N/A;  . Right heart catheterization N/A 09/22/2014    Procedure: RIGHT HEART CATH;  Surgeon: Jolaine Artist, MD;  Location[facility] C[facility] LAB;  Service: Cardiovascular;  Laterality: N/A;    Family History  Problem Relation Age of Onset  . Coronary artery disease Father   . Heart disease Father   . Lung cancer    . Alzheimer's disease Mother   . Cancer Brother 9    lung cancer  . Heart attack Father    Social History:  reports that she has never smoked. She has never used smokeless tobacco. She reports that she does not drink alcohol or use illicit drugs.  Allergies:  Allergies  Allergen Reactions  . Other Shortness Of Breath    Detergents, perfumes and other strong odor emitting compounds  . Tramadol Shortness Of Breath and Nausea Only  . Ibuprofen Other (See Comments)    Told not to take med   OUTPATIENT MEDICATIONS: No current facility-administered medications on file prior to encounter.   Current Outpatient Prescriptions on File Prior to Encounter  Medication Sig Dispense Refill  . acetaminophen (TYLENOL) 325 MG tablet Take 2 tablets (650 mg total) by mouth every 4 (four) hours as needed for headache or mild pain.    Marland Kitchen amiodarone (PACERONE) 200 MG tablet TAKE ONE TABLET BY MOUTH ONCE DAILY 30 tablet 5  . ELIQUIS 2.5 MG TABS tablet TAKE ONE TABLET BY MOUTH TWICE DAILY 60 tablet 6  . Ferrous Sulfate (IRON) 325 (65 Fe) MG TABS TAKE ONE TABLET BY MOUTH TWICE DAILY WITH A MEAL 180 each 0  . levothyroxine (SYNTHROID, LEVOTHROID) 75 MCG tablet TAKE ONE TABLET BY MOUTH ONCE DAILY 90 tablet 3  .  loratadine (CLARITIN) 10 MG tablet Take 10 mg by mouth at bedtime as needed for allergies or rhinitis.     Marland Kitchen meclizine (ANTIVERT) 12.5 MG tablet Take 1 tablet (12.5 mg total) by mouth 2 (two) times daily as needed for dizziness. 10 tablet 0  . metolazone (ZAROXOLYN) 2.5 MG tablet Take 1 tablet (2.5 mg total) by mouth as needed (for weight 134 lb or greater). 10 tablet 3  . nitroGLYCERIN (NITROSTAT) 0.4 MG SL tablet Place 1 tablet (0.4 mg  total) under the tongue every 5 (five) minutes as needed. For chest pain 25 tablet 6  . pantoprazole (PROTONIX) 40 MG tablet TAKE ONE TABLET BY MOUTH ONCE DAILY 30 tablet 5  . potassium chloride SA (K-DUR,KLOR-CON) 20 MEQ tablet Take 3 tablets (60 mEq total) by mouth daily. Take an additional 19mq with Metolazone. 90 tablet 2  . simvastatin (ZOCOR) 10 MG tablet TAKE ONE TABLET BY MOUTH AT BEDTIME 30 tablet 0  . torsemide (DEMADEX) 20 MG tablet Take 2 tablets (40 mg total) by mouth daily. 60 tablet 6     Results for orders placed or performed during the hospital encounter of 03/31/16 (from the past 48 hour(s))  CBC     Status: Abnormal   Collection Time: 03/31/16  1:26 PM  Result Value Ref Range   WBC 5.4 4.0 - 10.5 K/uL   RBC 4.35 3.87 - 5.11 MIL/uL   Hemoglobin 13.2 12.0 - 15.0 g/dL   HCT 41.0 36.0 - 46.0 %   MCV 94.3 78.0 - 100.0 fL   MCH 30.3 26.0 - 34.0 pg   MCHC 32.2 30.0 - 36.0 g/dL   RDW 14.3 11.5 - 15.5 %   Platelets 137 (L) 150 - 400 K/uL  Basic metabolic panel     Status: Abnormal   Collection Time: 03/31/16  1:26 PM  Result Value Ref Range   Sodium 142 135 - 145 mmol/L   Potassium 3.7 3.5 - 5.1 mmol/L   Chloride 100 (L) 101 - 111 mmol/L   CO2 30 22 - 32 mmol/L   Glucose, Bld 115 (H) 65 - 99 mg/dL   BUN 34 (H) 6 - 20 mg/dL   Creatinine, Ser 2.30 (H) 0.44 - 1.00 mg/dL   Calcium 9.9 8.9 - 10.3 mg/dL   GFR calc non Af Amer 18 (L) >60 mL/min   GFR calc Af Amer 21 (L) >60 mL/min    Comment: (NOTE) The eGFR has been calculated using the CKD  EPI equation. This calculation has not been validated in all clinical situations. eGFR's persistently <60 mL/min signify possible Chronic Kidney Disease.    Anion gap 12 5 - 15  [facility] troponin[facility]     Status: None   Collection Time: 03/31/16  1:32 PM  Result Value Ref Range   Troponin i, poc 0.04 0.00 - 0.08 ng/mL   Comment 3            Comment: Due to the release kinetics of cTnI, a negative result within the first hours of the onset of symptoms does not rule out myocardial infarction with certainty. If myocardial infarction is still suspected, repeat the test at appropriate intervals.    ECHO   01/2016: Study Conclusions  - Left ventricle: The cavity size was normal. Systolic function was  normal. The estimated ejection fraction was in the range of 60%  to 65%. Wall motion was normal; there were no regional wall  motion abnormalities. Features are consistent with a pseudonormal  left ventricular filling pattern, with concomitant abnormal  relaxation and increased filling pressure (grade 2 diastolic  dysfunction). Doppler parameters are consistent with elevated  ventricular end-diastolic filling pressure. - Aortic valve: Trileaflet; mildly thickened, mildly calcified  leaflets. There was mild stenosis. - Aortic root: The aortic root was normal in size. - Mitral valve: There was severe regurgitation. - Left atrium: The atrium was severely dilated. - Right ventricle: Pacer wire or catheter noted in right ventricle.  Systolic function was normal. -  Right atrium: The atrium was moderately dilated. - Tricuspid valve: There was severe regurgitation. - Pulmonic valve: There was mild regurgitation. - Pulmonary arteries: Systolic pressure was moderately increased.  PA peak pressure: 49 mm Hg (S). - Line: A venous catheter was visualized in the superior vena cava,  with its tip in the right atrium. No abnormal features noted. - Inferior vena cava: The  vessel was normal in size. - Pericardium, extracardiac: There was no pericardial effusion.   CARDIAC CATH: 01/29/14  Left mainstem: The left main is patent without obstructive disease. There is diffuse irregularity noted. The mid left main has 20-30% stenosis and there is moderate calcification noted.  Left anterior descending (LAD): The LAD is patent to the left ventricular apex. There is mild diffuse plaquing without significant stenosis. The first diagonal is patent.  Left circumflex (LCx): Left circumflex is patent. The first OM is patent without significant stenosis.  Right coronary artery (RCA): This is a large, dominant vessel. The vessel is moderately calcified. The mid vessel has 30-40% stenosis. The PDA and PLA branches are patent.  Left ventriculography: Left ventricular systolic function is vigorous, LVEF is estimated at 65%, there is no significant mitral regurgitation   Final Conclusions:  1. Diffuse nonobstructive coronary artery disease 2. Normal left ventricular systolic function  RT Heart Cath 09/22/16  Findings:  RA = 11 RV = 49/6/10 PA = 49/22 (34) PCW = 18 (v = 25) Fick cardiac output/index = 6.4/4.0 PVR = 2.5 WU FA sat = 95% PA sat = 72%,74%   Plan/Discussion:  Her PCWP looks pretty good but has significant v-waves in wedge tracing as well as elevated R-sided pressures and significant peripheral edema  ROS: General:no colds or fevers, no weight changes Skin:no rashes or ulcers HEENT:no blurred vision, no congestion CV:see HPI PUL:see HPI GI:no diarrhea constipation or melena, no indigestion GU:no hematuria, some dysuria MS:no joint pain, no claudication Neuro:no syncope, no lightheadedness Endo:no diabetes, + thyroid disease   Blood pressure 131/60, pulse 69, temperature 98.6 F (37 C), temperature source Oral, resp. rate 20, SpO2 97 %. PE: General:Pleasant affect, NAD Skin:Warm and dry, brisk capillary refill HEENT:normocephalic, sclera  clear, mucus membranes moist Neck:supple, no JVD, no bruits  Heart:S1S2 RRR with 7-6/1 systolic murmur, no gallup, rub or click Lungs:clear without rales, rhonchi, or wheezes PJK:DTOI, non tender, + BS, do not palpate liver spleen or masses Ext:no lower ext edema, 2+ pedal pulses, 2+ radial pulses Neuro:alert and oriented X 3, MAE, follows commands, + facial symmetry, grips push pull of feet are all equal.     Assessment/Plan Principal Problem:   Pain in the chest-with moderate risk for cardiac etiology continue eliquis, serial troponin, lexiscan myoview in AM.    Active Problems:   Hyperlipidemia- continue zocor   Chronic anticoagulation continue eliquis   Pacemaker- pacing   Iron deficiency anemia hold Iron ? If this is causing chest pain and increase protonix--pain is worse when she lies down   Hypertension controlled   Chronic diastolic CHF (congestive heart failure) (HCC) euvolemic though mild overload on CXR    PAF (paroxysmal atrial fibrillation) ([place]) on amiodarone in SR At one point she was DNR but per notes aggressive treatment of HF.     Cecilie Kicks Nurse Practitioner Certified [place] Pager [phone] or after 5pm or weekends call [phone] 03/31/2016, 2:14 PM  I have seen, examined and evaluated the patient this PM along with Ms. Ingold, NP-C.  After reviewing all the available  data and chart, we discussed the patients laboratory, study & physical findings as well as symptoms in detail. I agree with her findings, examination as well as impression recommendations as per our discussion.    Very pleasant elderly woman with a history of noncritical coronary disease and chronic A. fib who is presenting now with symptoms that are somewhat atypical for angina, but are concerning to her especially with the most recent episode radiating to the left arm. She is currently asymptomatic, and has ruled out for MI.  Her EKG is relatively nonspecific with  pacing. She's not having any heart failure symptoms of PND, orthopnea or edema. Neurologic stable regimen as it is stable blood pressure. Her rate is controlled from A. Fib.  We discussed potential options for evaluating her chest pain. She would not be to walk on treadmill. I think it's reasonable to assess for potential ischemic etiology knowing her previous anatomy would help preclude concerns for missing multivessel disease. Plan Lexiscan Myoview in the morning. If negative can likely consider treatment for potential GERD type symptoms or musculoskeletal chest pain.  If her stress test is intermediate to high risk, would consider cardiac catheterization after discussion with her.    Glenetta Hew, M.D., M.S. Interventional Cardiologist   Pager # [phone] Phone # [phone] [address] [place], [place] [address]

## 2016-04-01 ENCOUNTER — Telehealth: Payer: Self-pay | Admitting: Internal Medicine

## 2016-04-01 ENCOUNTER — Observation Stay (HOSPITAL_COMMUNITY): Payer: Medicare Other

## 2016-04-01 ENCOUNTER — Encounter (HOSPITAL_COMMUNITY): Payer: Self-pay | Admitting: Nurse Practitioner

## 2016-04-01 ENCOUNTER — Other Ambulatory Visit: Payer: Self-pay | Admitting: Cardiology

## 2016-04-01 DIAGNOSIS — N289 Disorder of kidney and ureter, unspecified: Secondary | ICD-10-CM

## 2016-04-01 DIAGNOSIS — I10 Essential (primary) hypertension: Secondary | ICD-10-CM | POA: Diagnosis not present

## 2016-04-01 DIAGNOSIS — I130 Hypertensive heart and chronic kidney disease with heart failure and stage 1 through stage 4 chronic kidney disease, or unspecified chronic kidney disease: Secondary | ICD-10-CM | POA: Diagnosis not present

## 2016-04-01 DIAGNOSIS — N184 Chronic kidney disease, stage 4 (severe): Secondary | ICD-10-CM | POA: Diagnosis not present

## 2016-04-01 DIAGNOSIS — I119 Hypertensive heart disease without heart failure: Secondary | ICD-10-CM

## 2016-04-01 DIAGNOSIS — R7989 Other specified abnormal findings of blood chemistry: Secondary | ICD-10-CM | POA: Diagnosis not present

## 2016-04-01 DIAGNOSIS — E785 Hyperlipidemia, unspecified: Secondary | ICD-10-CM | POA: Diagnosis not present

## 2016-04-01 DIAGNOSIS — I5032 Chronic diastolic (congestive) heart failure: Secondary | ICD-10-CM | POA: Diagnosis not present

## 2016-04-01 DIAGNOSIS — R079 Chest pain, unspecified: Secondary | ICD-10-CM | POA: Diagnosis not present

## 2016-04-01 DIAGNOSIS — E876 Hypokalemia: Secondary | ICD-10-CM

## 2016-04-01 DIAGNOSIS — R0602 Shortness of breath: Secondary | ICD-10-CM | POA: Diagnosis not present

## 2016-04-01 LAB — NM MYOCAR MULTI W/SPECT W/WALL MOTION / EF
CHL CUP MPHR: 131 {beats}/min
CHL CUP RESTING HR STRESS: 72 {beats}/min
CSEPED: 0 min
CSEPEDS: 0 s
Estimated workload: 1 METS
Peak HR: 80 {beats}/min
Percent HR: 61 %

## 2016-04-01 LAB — BASIC METABOLIC PANEL
ANION GAP: 13 (ref 5–15)
BUN: 35 mg/dL — ABNORMAL HIGH (ref 6–20)
CALCIUM: 10 mg/dL (ref 8.9–10.3)
CO2: 32 mmol/L (ref 22–32)
Chloride: 95 mmol/L — ABNORMAL LOW (ref 101–111)
Creatinine, Ser: 2.31 mg/dL — ABNORMAL HIGH (ref 0.44–1.00)
GFR, EST AFRICAN AMERICAN: 20 mL/min — AB (ref >60)
GFR, EST NON AFRICAN AMERICAN: 18 mL/min — AB (ref >60)
GLUCOSE: 162 mg/dL — AB (ref 65–99)
POTASSIUM: 3.2 mmol/L — AB (ref 3.5–5.1)
Sodium: 140 mmol/L (ref 135–145)

## 2016-04-01 LAB — CBC
HCT: 44.7 % (ref 36.0–46.0)
HEMOGLOBIN: 14.2 g/dL (ref 12.0–15.0)
MCH: 30.1 pg (ref 26.0–34.0)
MCHC: 31.8 g/dL (ref 30.0–36.0)
MCV: 94.9 fL (ref 78.0–100.0)
PLATELETS: 125 10*3/uL — AB (ref 150–400)
RBC: 4.71 MIL/uL (ref 3.87–5.11)
RDW: 14.1 % (ref 11.5–15.5)
WBC: 4.8 10*3/uL (ref 4.0–10.5)

## 2016-04-01 LAB — TROPONIN I
TROPONIN I: 0.04 ng/mL — AB (ref <0.031)
TROPONIN I: 0.04 ng/mL — AB (ref <0.031)

## 2016-04-01 MED ORDER — TECHNETIUM TC 99M TETROFOSMIN IV KIT
10.0000 | PACK | Freq: Once | INTRAVENOUS | Status: AC | PRN
  Administered 2016-04-01: 10 via INTRAVENOUS

## 2016-04-01 MED ORDER — IRON 325 (65 FE) MG PO TABS: 1.0000 | ORAL_TABLET | Freq: Two times a day (BID) | ORAL | Status: DC

## 2016-04-01 MED ORDER — POTASSIUM CHLORIDE CRYS ER 20 MEQ PO TBCR: 60.0000 meq | EXTENDED_RELEASE_TABLET | Freq: Every day | ORAL | Status: DC

## 2016-04-01 MED ORDER — REGADENOSON 0.4 MG/5ML IV SOLN
INTRAVENOUS | Status: AC
  Administered 2016-04-01: 0.4 mg via INTRAVENOUS
  Filled 2016-04-01: qty 5

## 2016-04-01 MED ORDER — PANTOPRAZOLE SODIUM 40 MG PO TBEC: 40.0000 mg | DELAYED_RELEASE_TABLET | Freq: Two times a day (BID) | ORAL | Status: DC

## 2016-04-01 MED ORDER — POTASSIUM CHLORIDE CRYS ER 20 MEQ PO TBCR: 20.0000 meq | EXTENDED_RELEASE_TABLET | Freq: Once | ORAL | Status: DC

## 2016-04-01 MED ORDER — REGADENOSON 0.4 MG/5ML IV SOLN
0.4000 mg | Freq: Once | INTRAVENOUS | Status: AC
  Administered 2016-04-01: 0.4 mg via INTRAVENOUS
  Filled 2016-04-01: qty 5

## 2016-04-01 MED ORDER — TECHNETIUM TC 99M TETROFOSMIN IV KIT
30.0000 | PACK | Freq: Once | INTRAVENOUS | Status: AC | PRN
  Administered 2016-04-01: 30 via INTRAVENOUS

## 2016-04-01 MED ORDER — TORSEMIDE 20 MG PO TABS: 40.0000 mg | ORAL_TABLET | Freq: Every day | ORAL | Status: DC

## 2016-04-01 NOTE — Discharge Summary (Signed)
Physician Discharge Summary       Patient ID: [name] MRN: [id number] DOB/AGE: [birth year] 82 y.o.  Admit date: 03/31/2016 Discharge date: 04/01/2016 Primary Cardiologist: Dr. Haroldine Laws   Discharge Diagnoses:  Principal Problem:   Chest pain with moderate risk for cardiac etiology, negative MI and Nuc study, + muscular skeletal Active Problems:   Hyperlipidemia   HYPERTENSION, BENIGN   Hypothyroidism   Chronic anticoagulation   Pacemaker   Iron deficiency anemia   Elevated troponin I level   CKD (chronic kidney disease) stage 4, GFR 15-29 ml/min (HCC)   Chronic diastolic CHF (congestive heart failure) (HCC)   PAF (paroxysmal atrial fibrillation) (HCC)   Chest pain at rest   Hypertensive heart disease   Discharged Condition: good  Procedures: none  Hospital Course:   82 year old female with history of nonobstructive CAD by cardiac catheterization in 01/2014, paroxsymal atrial fibrillation, (Peviously thought to have failed amiodarone and had chronic AF. However in November was AV pacing) - on amiodarone 200 mg daily. ON Eliquis 2.5 mg twice a day diastolic CHF, HTN, HL, anemia, and CKD. Patient was admitted 04/2014 x2 with acute on chronic diastolic CHF. She underwent cardioversion with restoration of NSR. This resulted in profound bradycardia and hypotension requiring pacemaker implantation. Pacemaker implant was complicated by acute blood loss anemia requiring transfusion with PRBCs. Echo 01/22/16 LVEF 60-65%, grade 2 DD, severe MR and TR, baseline BNP 200. Last seen by Dr. Haroldine Laws 03/09/16 wt then 136. He stated she had a very narrow euvolemic window. NYHA II-III.  07/06/14 : EGD with only mild gastritis and small HH  Since then pt has been seen in ER 03/16/16- NTG with not much help and neg troponins and 03/28/16 for chest pain- ant chest heaviness, feeling cold in center of her chest and SOB. Ntg may have relieved pain then with neg troponins. Has been seen in  [facility] office twice as well. In 01/2016 has similar complaints ruled out acute coronary syndrome, esophageal dysmotility, normal esophagogram.  03/31/16 presented to ER with chest pain since 0630. Also with lt arm intermittent numbness. And Lt side. 2 NTG without relief. Her pain is pressure initially then more of a burning pain. Pain is worse with lying down. Sometimes lasts 5 min other times 45 min. No nausea or SOB, no diaphoresis.   EKG with SR LBBB may be AV pacing . Cr. 1.9 to 2.3 a little increased 2.1 was previous pk. Troponin 0.03--> 0.04   She had another episode of chest pain at 0630 AM the next day she was given NTG and pain eventually resolved.  She had nuc study that was neg for ischemia.   She was seen and evaluated by Dr. Ellyn Hack.  And he believes the pain to be muscular skeletal.  She had been in an accident previously.  We are holding her iron for 1 week and increasing her protonix for 1 week as well.  Additionally her Cr. Was elevated so her torsemide is held for 2 days then to resume.  She has follow up appts with both her PCP and Dr. Haroldine Laws.   She will have follow up BMP on Monday at Dr. Alan Ripper office.     Consults: None  Significant Diagnostic Studies:  BMP Latest Ref Rng 04/01/2016 03/31/2016 03/28/2016  Glucose 65 - 99 mg/dL 162(H) 115(H) 128(H)  BUN 6 - 20 mg/dL 35(H) 34(H) 38(H)  Creatinine 0.44 - 1.00 mg/dL 2.31(H) 2.30(H) 1.90(H)  Sodium 135 - 145 mmol/L 140 142  142  Potassium 3.5 - 5.1 mmol/L 3.2(L) 3.7 4.3  Chloride 101 - 111 mmol/L 95(L) 100(L) 108  CO2 22 - 32 mmol/L 32 30 27  Calcium 8.9 - 10.3 mg/dL 10.0 9.9 9.7   CBC Latest Ref Rng 04/01/2016 03/31/2016 03/28/2016  WBC 4.0 - 10.5 K/uL 4.8 5.4 4.5  Hemoglobin 12.0 - 15.0 g/dL 14.2 13.2 12.3  Hematocrit 36.0 - 46.0 % 44.7 41.0 37.8  Platelets 150 - 400 K/uL 125(L) 137(L) 112(L)     Hepatic Function Latest Ref Rng 03/31/2016 03/16/2016 01/22/2016  Total Protein 6.5 - 8.1 g/dL 6.7 6.4(L) 6.0(L)  Albumin 3.5  - 5.0 g/dL 3.3(L) 3.3(L) 3.1(L)  AST 15 - 41 U/L _0 ALT 14 - 54 U/L _1 Alk Phosphatase 38 - 126 U/L 71 65 55  Total Bilirubin 0.3 - 1.2 mg/dL 1.0 0.6 0.7  Bilirubin, Direct 0.1 - 0.5 mg/dL 0.2 0.1 -    BNP  202  Troponin 0.04 to 0.03   TSH 1.472  CHEST 2 VIEW  COMPARISON: Chest x-rays dated 03/28/2016 and 03/16/2016.  FINDINGS: Heart size is mildly prominent, stable. Overall cardiomediastinal silhouette is stable in size and configuration. Right chest wall pacemaker appears stable in position.  There is a chronic mild central pulmonary vascular congestion. Coarse interstitial markings again seen within both lungs, also not significantly changed compared to multiple prior exams, suggesting a chronic mild CHF/volume overload. No new lung findings. No evidence of pneumonia. No pleural effusion or pneumothorax seen.  Degenerative changes again noted within the thoracic spine. Chronic compression deformities again appreciated at the thoracolumbar junction, better seen on previous exams. No acute- appearing osseous abnormality appreciated  IMPRESSION: 1. Chronic mild central pulmonary vascular congestion and interstitial thickening/edema, suggesting a chronic mild CHF/volume overload. 2. No acute findings.  Lexiscan myoview: IMPRESSION: 1. No reversible ischemia or infarction.  2. Normal left ventricular wall motion.  3. Left ventricular ejection fraction 63%  4. Non invasive risk stratification*: Low   Discharge Exam: Blood pressure 148/71, pulse 75, temperature 98 F (36.7 C), temperature source Oral, resp. rate 16, height _2  (1.473 m), weight 129 lb 1.6 oz (58.559 kg), SpO2 96 %.  Disposition: 01-Home or Self Care     Medication List    TAKE these medications        acetaminophen 325 MG tablet  Commonly known as:  TYLENOL  Take 2 tablets (650 mg total) by mouth every 4 (four) hours as needed for headache or mild pain.      amiodarone 200 MG tablet  Commonly known as:  PACERONE  TAKE ONE TABLET BY MOUTH ONCE DAILY     ELIQUIS 2.5 MG Tabs tablet  Generic drug:  apixaban  TAKE ONE TABLET BY MOUTH TWICE DAILY     Iron 325 (65 Fe) MG Tabs  Take 1 tablet by mouth 2 (two) times daily with a meal.  Start taking on:  04/08/2016     levothyroxine 75 MCG tablet  Commonly known as:  SYNTHROID, LEVOTHROID  TAKE ONE TABLET BY MOUTH ONCE DAILY     loratadine 10 MG tablet  Commonly known as:  CLARITIN  Take 10 mg by mouth at bedtime as needed for allergies or rhinitis.     meclizine 12.5 MG tablet  Commonly known as:  ANTIVERT  Take 1 tablet (12.5 mg total) by mouth 2 (two) times daily as needed for dizziness.     metolazone 2.5 MG tablet  Commonly known  as:  ZAROXOLYN  Take 1 tablet (2.5 mg total) by mouth as needed (for weight 134 lb or greater).     nitroGLYCERIN 0.4 MG SL tablet  Commonly known as:  NITROSTAT  Place 1 tablet (0.4 mg total) under the tongue every 5 (five) minutes as needed. For chest pain     pantoprazole 40 MG tablet  Commonly known as:  PROTONIX  Take 1 tablet (40 mg total) by mouth 2 (two) times daily. For 1 week then go back to 1 daily.     potassium chloride SA 20 MEQ tablet  Commonly known as:  K-DUR,KLOR-CON  Take 3 tablets (60 mEq total) by mouth daily. Take an additional 47mq with Metolazone.  Start taking on:  04/03/2016     simvastatin 10 MG tablet  Commonly known as:  ZOCOR  TAKE ONE TABLET BY MOUTH AT BEDTIME     torsemide 20 MG tablet  Commonly known as:  DEMADEX  Take 2 tablets (40 mg total) by mouth daily.  Start taking on:  04/03/2016       Follow-up Information    Follow up with BGlori Bickers MD On 04/27/2016.   Specialty:  Cardiology   Why:  at 2:20 pm   Contact information:   [id number]       Discharge Instructions: The pain is muscular skeletal from your accident last year.   The blood  supply to your heart is good.  Your kidney function is stressed.  Hold your demadex for today and tomorrow then resume on Sunday 04/03/16  Hold your Iron for 1 week then resume.  Heart Healthy low salt diet  Weigh daily Call heart failure clinic if weight climbs more than 3 pounds in a day or 5 pounds in a week. No salt to very little salt in your diet.  No more than 2000 mg in a day. Call if increased shortness of breath or increased swelling.  Have blood work done at Dr. RAlan Ripperoffice on Monday 04/04/16  Anytime to check your kidneys.   Signed: LCecilie KicksNurse Practitioner-Certified [facility]: [facility]5/19/2017, 3:39 PM  Time spent on discharge :30 minutes.     I saw and evaluated the patient following her stress test. The results showed no evidence of ischemia or infarction. Most likely the chest pain is musculoskeletal in nature based on description. She has been having this off and on since her accident. It seems to go along with her injured sternum. I would recommend NSAIDs/Tylenol for pain relief.  We discussed different options for evaluating her symptoms, felt most of these can be done as an outpatient. She agreed and was eager to go home.   DGlenetta Hew M.D., M.S. Interventional Cardiologist   Pager # [phone] # [phone] [place] [address]

## 2016-04-01 NOTE — Telephone Encounter (Signed)
Please advise. Thanks.  

## 2016-04-01 NOTE — Progress Notes (Signed)
Discharge instructions given. Pt verbalized understanding and all questions were answered.  

## 2016-04-01 NOTE — Telephone Encounter (Signed)
Joann needs verbal order for  PT twice a wk for 8 wks and would like a nurse assessment evaluation also.
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
## 2016-04-01 NOTE — Progress Notes (Signed)
Pt chest pain trending down. Report given to incoming RN to wait for MD call back.
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
## 2016-04-01 NOTE — Progress Notes (Signed)
Pt c/o chest pain ..8 out of 10. NTG .4mg  x2. EKG done O2 given.VS taken bp=96/52 hr=73. [facility]aged [facility]. No response yet.

## 2016-04-04 ENCOUNTER — Other Ambulatory Visit (INDEPENDENT_AMBULATORY_CARE_PROVIDER_SITE_OTHER): Payer: Medicare Other

## 2016-04-04 ENCOUNTER — Telehealth: Payer: Self-pay | Admitting: Internal Medicine

## 2016-04-04 DIAGNOSIS — E785 Hyperlipidemia, unspecified: Secondary | ICD-10-CM

## 2016-04-04 LAB — BASIC METABOLIC PANEL WITH GFR
BUN: 45 mg/dL — ABNORMAL HIGH (ref 7–25)
CALCIUM: 9.7 mg/dL (ref 8.6–10.4)
CO2: 32 mmol/L — ABNORMAL HIGH (ref 20–31)
CREATININE: 2.63 mg/dL — AB (ref 0.60–0.88)
Chloride: 96 mmol/L — ABNORMAL LOW (ref 98–110)
GFR, EST AFRICAN AMERICAN: 18 mL/min — AB (ref >=60)
GFR, Est Non African American: 16 mL/min — ABNORMAL LOW (ref >=60)
Glucose, Bld: 135 mg/dL — ABNORMAL HIGH (ref 65–99)
Potassium: 3.3 mmol/L — ABNORMAL LOW (ref 3.5–5.3)
SODIUM: 141 mmol/L (ref 135–146)

## 2016-04-04 NOTE — Telephone Encounter (Signed)
Please advise, thanks.

## 2016-04-04 NOTE — Telephone Encounter (Signed)
[name] would like an order for home health nursing to evaluate .verbal order

## 2016-04-04 NOTE — Telephone Encounter (Signed)
Left detailed message.   

## 2016-04-04 NOTE — Telephone Encounter (Signed)
Sure  Ok to do pt  Order

## 2016-04-05 NOTE — Telephone Encounter (Signed)
Notified Joann @ [place] of approval for verbal order for nursing evaluation

## 2016-04-05 NOTE — Telephone Encounter (Signed)
Ok to do this 

## 2016-04-07 ENCOUNTER — Telehealth (HOSPITAL_COMMUNITY): Payer: Self-pay | Admitting: *Deleted

## 2016-04-07 ENCOUNTER — Telehealth: Payer: Self-pay | Admitting: Internal Medicine

## 2016-04-07 NOTE — Telephone Encounter (Signed)
Pt called and stated she needs oral surgery to have some teeth removed.  The dentist wants to know if she can hold her Eliquis for how long she needs to hold it.  Will discuss w/Dr Bensimhon and call her back next week

## 2016-04-07 NOTE — Telephone Encounter (Signed)
Pt states she has a terrible tooth ache and has not been to the dentist in years. Hopes Dr Regis Bill knows one that she can see asap

## 2016-04-08 ENCOUNTER — Ambulatory Visit: Payer: Medicare Other | Admitting: Internal Medicine

## 2016-04-08 NOTE — Telephone Encounter (Signed)
I spoke with pt and she has spoke with dentist, no fever at this time.

## 2016-04-08 NOTE — Telephone Encounter (Signed)
Hold for 24 hours prior to surgery. Restart within 24 hours after surgery.   Sham Alviar,MD 4:41 PM

## 2016-04-08 NOTE — Telephone Encounter (Signed)
No I don't  But if get fever need to seek emergent care .  If infected we can   In case antibiotic might help.   At ov.

## 2016-04-09 ENCOUNTER — Other Ambulatory Visit: Payer: Self-pay | Admitting: Internal Medicine

## 2016-04-12 ENCOUNTER — Telehealth: Payer: Self-pay | Admitting: General Practice

## 2016-04-12 ENCOUNTER — Other Ambulatory Visit: Payer: Self-pay | Admitting: General Practice

## 2016-04-12 NOTE — Telephone Encounter (Signed)
Once a day Cardiology told her to increase  Short term and then once a day

## 2016-04-12 NOTE — Telephone Encounter (Signed)
DUPLICATE REQUEST.  SENT IN ON 04/01/16

## 2016-04-13 NOTE — Telephone Encounter (Signed)
Patient made aware of eliquis instructions related to teeth extraction.  [name]

## 2016-04-15 ENCOUNTER — Telehealth: Payer: Self-pay | Admitting: Internal Medicine

## 2016-04-15 NOTE — Telephone Encounter (Signed)
[name] was notified of information below.

## 2016-04-15 NOTE — Telephone Encounter (Signed)
Ok to do this  Make sure they know most of her meds are managed by the heart failure clinic.

## 2016-04-15 NOTE — Telephone Encounter (Signed)
Please advise, thanks.

## 2016-04-15 NOTE — Telephone Encounter (Signed)
[name]  would like verbal order for nursing medication management for twice a wk next and then once a wk for 2 wks

## 2016-04-19 ENCOUNTER — Other Ambulatory Visit: Payer: Self-pay | Admitting: General Practice

## 2016-04-19 MED ORDER — PANTOPRAZOLE SODIUM 40 MG PO TBEC: 40.0000 mg | DELAYED_RELEASE_TABLET | Freq: Two times a day (BID) | ORAL | Status: DC

## 2016-04-20 ENCOUNTER — Ambulatory Visit (INDEPENDENT_AMBULATORY_CARE_PROVIDER_SITE_OTHER): Payer: Medicare Other | Admitting: *Deleted

## 2016-04-20 DIAGNOSIS — I480 Paroxysmal atrial fibrillation: Secondary | ICD-10-CM

## 2016-04-20 DIAGNOSIS — Z950 Presence of cardiac pacemaker: Secondary | ICD-10-CM

## 2016-04-20 NOTE — Progress Notes (Signed)
Remote pacemaker transmission.   

## 2016-04-23 ENCOUNTER — Other Ambulatory Visit (HOSPITAL_COMMUNITY): Payer: Self-pay | Admitting: Internal Medicine

## 2016-04-25 ENCOUNTER — Encounter (HOSPITAL_COMMUNITY): Payer: Self-pay | Admitting: Emergency Medicine

## 2016-04-25 ENCOUNTER — Emergency Department (HOSPITAL_COMMUNITY)
Admission: EM | Admit: 2016-04-25 | Discharge: 2016-04-26 | Disposition: A | Discharge status: Home / Self Care | Payer: Medicare Other | Attending: Emergency Medicine | Admitting: Emergency Medicine

## 2016-04-25 DIAGNOSIS — Z7901 Long term (current) use of anticoagulants: Secondary | ICD-10-CM | POA: Diagnosis not present

## 2016-04-25 DIAGNOSIS — I5033 Acute on chronic diastolic (congestive) heart failure: Secondary | ICD-10-CM | POA: Insufficient documentation

## 2016-04-25 DIAGNOSIS — I110 Hypertensive heart disease with heart failure: Secondary | ICD-10-CM | POA: Insufficient documentation

## 2016-04-25 DIAGNOSIS — R1011 Right upper quadrant pain: Secondary | ICD-10-CM | POA: Diagnosis present

## 2016-04-25 DIAGNOSIS — Z85828 Personal history of other malignant neoplasm of skin: Secondary | ICD-10-CM | POA: Insufficient documentation

## 2016-04-25 DIAGNOSIS — Z950 Presence of cardiac pacemaker: Secondary | ICD-10-CM | POA: Diagnosis not present

## 2016-04-25 DIAGNOSIS — M6281 Muscle weakness (generalized): Secondary | ICD-10-CM | POA: Diagnosis not present

## 2016-04-25 DIAGNOSIS — Z79899 Other long term (current) drug therapy: Secondary | ICD-10-CM | POA: Insufficient documentation

## 2016-04-25 DIAGNOSIS — Z791 Long term (current) use of non-steroidal anti-inflammatories (NSAID): Secondary | ICD-10-CM | POA: Insufficient documentation

## 2016-04-25 DIAGNOSIS — I5042 Chronic combined systolic (congestive) and diastolic (congestive) heart failure: Secondary | ICD-10-CM | POA: Diagnosis not present

## 2016-04-25 DIAGNOSIS — R109 Unspecified abdominal pain: Secondary | ICD-10-CM

## 2016-04-25 DIAGNOSIS — H8113 Benign paroxysmal vertigo, bilateral: Secondary | ICD-10-CM | POA: Diagnosis not present

## 2016-04-25 DIAGNOSIS — I130 Hypertensive heart and chronic kidney disease with heart failure and stage 1 through stage 4 chronic kidney disease, or unspecified chronic kidney disease: Secondary | ICD-10-CM | POA: Diagnosis not present

## 2016-04-25 DIAGNOSIS — N184 Chronic kidney disease, stage 4 (severe): Secondary | ICD-10-CM

## 2016-04-25 DIAGNOSIS — I480 Paroxysmal atrial fibrillation: Secondary | ICD-10-CM

## 2016-04-25 DIAGNOSIS — R1031 Right lower quadrant pain: Secondary | ICD-10-CM | POA: Diagnosis not present

## 2016-04-25 LAB — CBC WITH DIFFERENTIAL/PLATELET
BASOS ABS: 0 10*3/uL (ref 0.0–0.1)
BASOS PCT: 0 %
EOS ABS: 0.1 10*3/uL (ref 0.0–0.7)
Eosinophils Relative: 2 %
HEMATOCRIT: 38.2 % (ref 36.0–46.0)
HEMOGLOBIN: 12.1 g/dL (ref 12.0–15.0)
Lymphocytes Relative: 16 %
Lymphs Abs: 0.8 10*3/uL (ref 0.7–4.0)
MCH: 30.3 pg (ref 26.0–34.0)
MCHC: 31.7 g/dL (ref 30.0–36.0)
MCV: 95.5 fL (ref 78.0–100.0)
MONO ABS: 0.4 10*3/uL (ref 0.1–1.0)
MONOS PCT: 8 %
NEUTROS ABS: 3.8 10*3/uL (ref 1.7–7.7)
NEUTROS PCT: 74 %
Platelets: 127 10*3/uL — ABNORMAL LOW (ref 150–400)
RBC: 4 MIL/uL (ref 3.87–5.11)
RDW: 14.3 % (ref 11.5–15.5)
WBC: 5.1 10*3/uL (ref 4.0–10.5)

## 2016-04-25 LAB — URINALYSIS, ROUTINE W REFLEX MICROSCOPIC
BILIRUBIN URINE: NEGATIVE
Glucose, UA: NEGATIVE mg/dL
HGB URINE DIPSTICK: NEGATIVE
Ketones, ur: NEGATIVE mg/dL
NITRITE: NEGATIVE
PROTEIN: NEGATIVE mg/dL
Specific Gravity, Urine: 1.013 (ref 1.005–1.030)
pH: 5.5 (ref 5.0–8.0)

## 2016-04-25 LAB — COMPREHENSIVE METABOLIC PANEL
ALK PHOS: 80 U/L (ref 38–126)
ALT: 18 U/L (ref 14–54)
ANION GAP: 9 (ref 5–15)
AST: 19 U/L (ref 15–41)
Albumin: 3.6 g/dL (ref 3.5–5.0)
BILIRUBIN TOTAL: 0.7 mg/dL (ref 0.3–1.2)
BUN: 34 mg/dL — ABNORMAL HIGH (ref 6–20)
CALCIUM: 9.5 mg/dL (ref 8.9–10.3)
CO2: 26 mmol/L (ref 22–32)
CREATININE: 2.16 mg/dL — AB (ref 0.44–1.00)
Chloride: 104 mmol/L (ref 101–111)
GFR calc non Af Amer: 19 mL/min — ABNORMAL LOW (ref >60)
GFR, EST AFRICAN AMERICAN: 22 mL/min — AB (ref >60)
Glucose, Bld: 117 mg/dL — ABNORMAL HIGH (ref 65–99)
Potassium: 3.9 mmol/L (ref 3.5–5.1)
SODIUM: 139 mmol/L (ref 135–145)
TOTAL PROTEIN: 6.6 g/dL (ref 6.5–8.1)

## 2016-04-25 LAB — URINE MICROSCOPIC-ADD ON: RBC / HPF: NONE SEEN RBC/hpf (ref 0–5)

## 2016-04-25 LAB — I-STAT TROPONIN, ED: TROPONIN I, POC: 0 ng/mL (ref 0.00–0.08)

## 2016-04-25 LAB — LIPASE, BLOOD: LIPASE: 36 U/L (ref 11–51)

## 2016-04-25 MED ORDER — DIATRIZOATE MEGLUMINE & SODIUM 66-10 % PO SOLN
ORAL | Status: AC
  Filled 2016-04-25: qty 30

## 2016-04-25 MED ORDER — FENTANYL CITRATE (PF) 100 MCG/2ML IJ SOLN: 12.5000 ug | Freq: Once | INTRAMUSCULAR | Status: DC

## 2016-04-25 MED ORDER — FENTANYL CITRATE (PF) 100 MCG/2ML IJ SOLN: 25.0000 ug | Freq: Once | INTRAMUSCULAR | Status: DC

## 2016-04-25 NOTE — ED Notes (Signed)
Pt arrived to ED via EMS. C/o RUQ abdominal pain. Vitals signs normal. Pain rated at 8 on scale of 0-10. Hx of AFIB, COPD, CHF and pacemaker.

## 2016-04-25 NOTE — ED Notes (Signed)
MD at bedside. 

## 2016-04-25 NOTE — ED Provider Notes (Signed)
CSN: [id number]     Arrival date & time 04/25/16  2152 History   First MD Initiated Contact with Patient 04/25/16 2153     Chief Complaint  Patient presents with  . Abdominal Pain   Patient is a 82 y.o. female presenting with abdominal pain.  Abdominal Pain Pain location:  RUQ Pain quality: aching   Pain radiates to:  Epigastric region, RUQ and RLQ Pain severity:  Moderate Onset quality:  Gradual Timing:  Constant Progression:  Partially resolved Chronicity:  New Context: previous surgery   Context: not recent illness and not suspicious food intake   Relieved by:  None tried Worsened by:  Nothing tried Ineffective treatments:  None tried Associated symptoms: no anorexia, no chest pain, no chills, no cough, no fever and no shortness of breath      Past Medical History  Diagnosis Date  . Dyslipidemia   . Hypertension   . Palpitations     PVCs/bigeminy on event in May 2009 revealing this was relatively asymptomatic  . Chest pain     a. 2011 Neg MV;  b. 01/2014 Cath: LM 20-30, LAD nl, D1 nl, LCX nl, OM1 nl, RCA dom 30-54m, PD/PL nl, EF 65%->Med Rx; c. 03/2016 MV: no ischemia/infarct. EF 63%.  . Degenerative joint disease   . Hx of varicella   . History of skin cancer     tafeen/ non melanoma.   . Thrombocytopenia ([place])   . Anemia   . Monoclonal gammopathies   . Right lower quadrant abdominal pain 07/19/2012    Recurrent  With nausea    This is new since she had ct for llq pain in [facility]    R/o appendiceal problem  Hernia  Get ct scan  And plan  Fu     . Diastolic CHF, acute on chronic ([place] ) 01/31/2012  . Atrial fibrillation ([place])     a. Dx 01/2014->Eliquis started.  . Cancer ([place])   . Pacemaker   . Dysrhythmia   . Family history of adverse reaction to anesthesia     " multiple family members have difficulty waking "  . GERD (gastroesophageal reflux disease)   . Hypothyroidism    Past Surgical History  Procedure Laterality Date  . Cholecystectomy  1999  . Cesarean section       times 2  . Shoulder surgery  1996    Right   . Cataract extraction      Bilateral  implantt  . Cardioversion N/A 02/26/2014    Procedure: CARDIOVERSION AT BEDSIDE;  Surgeon: Pixie Casino, MD;  Location: [facility];  Service: Cardiovascular;  Laterality: N/A;  . Cardioversion N/A 04/22/2014    Procedure: CARDIOVERSION (BEDSIDE);  Surgeon: [name], MD;  Location: [facility] OR;  Service: Cardiovascular;  Laterality: N/A;  . Pacemaker insertion  04/23/2014    STJ Assurity dual chamber pacemaker implanted by Dr Rayann Heman  . Insert / replace / remove pacemaker    . Esophagogastroduodenoscopy N/A 07/06/2014    Procedure: ESOPHAGOGASTRODUODENOSCOPY (EGD);  Surgeon: Ladene Artist, MD;  Location: [facility] ENDOSCOPY;  Service: Endoscopy;  Laterality: N/A;  . Left heart catheterization with coronary angiogram N/A 01/29/2014    Procedure: LEFT HEART CATHETERIZATION WITH CORONARY ANGIOGRAM;  Surgeon: Blane Ohara, MD;  Location[facility] CATH LAB;  Service: Cardiovascular;  Laterality: N/A;  . Permanent pacemaker insertion N/A 04/23/2014    Procedure: PERMANENT PACEMAKER INSERTION;  Surgeon: Evans Lance, MD;  Location: [facility] CATH LAB;  Service: Cardiovascular;  Laterality: N/A;  .  Right heart catheterization N/A 09/22/2014    Procedure: RIGHT HEART CATH;  Surgeon: Jolaine Artist, MD;  Location: [facility] CATH LAB;  Service: Cardiovascular;  Laterality: N/A;   Family History  Problem Relation Age of Onset  . Coronary artery disease Father   . Heart disease Father   . Lung cancer    . Alzheimer's disease Mother   . Cancer Brother 71    lung cancer  . Heart attack Father    Social History  Substance Use Topics  . Smoking status: Never Smoker   . Smokeless tobacco: Never Used  . Alcohol Use: No   OB History    No data available     Review of Systems  Constitutional: Negative for fever and chills.  Respiratory: Negative for cough, chest tightness and shortness of breath.   Cardiovascular: Negative for chest  pain.  Gastrointestinal: Positive for abdominal pain. Negative for anorexia.  Allergic/Immunologic: Negative for immunocompromised state.  All other systems reviewed and are negative.     Allergies  Other; Tramadol; and Ibuprofen  Home Medications   Prior to Admission medications   Medication Sig Start Date End Date Taking? Authorizing Provider  acetaminophen (TYLENOL) 325 MG tablet Take 2 tablets (650 mg total) by mouth every 4 (four) hours as needed for headache or mild pain. 09/26/14   Isaiah Serge, NP  amiodarone (PACERONE) 200 MG tablet TAKE ONE TABLET BY MOUTH ONCE DAILY 11/17/15   Evans Lance, MD  ELIQUIS 2.5 MG TABS tablet TAKE ONE TABLET BY MOUTH TWICE DAILY 03/14/16   Jolaine Artist, MD  Ferrous Sulfate (IRON) 325 (65 Fe) MG TABS Take 1 tablet by mouth 2 (two) times daily with a meal. 04/08/16   Isaiah Serge, NP  levothyroxine (SYNTHROID, LEVOTHROID) 75 MCG tablet TAKE ONE TABLET BY MOUTH ONCE DAILY 02/29/16   Burnis Medin, MD  loratadine (CLARITIN) 10 MG tablet Take 10 mg by mouth at bedtime as needed for allergies or rhinitis.     Historical Provider, MD  meclizine (ANTIVERT) 12.5 MG tablet Take 1 tablet (12.5 mg total) by mouth 2 (two) times daily as needed for dizziness. 03/09/16   Jolaine Artist, MD  metolazone (ZAROXOLYN) 2.5 MG tablet Take 1 tablet (2.5 mg total) by mouth as needed (for weight 134 lb or greater). 03/09/16   Jolaine Artist, MD  nitroGLYCERIN (NITROSTAT) 0.4 MG SL tablet Place 1 tablet (0.4 mg total) under the tongue every 5 (five) minutes as needed. For chest pain 12/02/13   Fay Records, MD  pantoprazole (PROTONIX) 40 MG tablet Take 1 tablet (40 mg total) by mouth 2 (two) times daily. For 1 week then go back to 1 daily. 04/19/16   Burnis Medin, MD  potassium chloride SA (K-DUR,KLOR-CON) 20 MEQ tablet Take 3 tablets (60 mEq total) by mouth daily. Take an additional 40meq with Metolazone. 04/03/16   Isaiah Serge, NP  simvastatin (ZOCOR) 10 MG  tablet TAKE ONE TABLET BY MOUTH AT BEDTIME 03/30/16   Jolaine Artist, MD  torsemide (DEMADEX) 20 MG tablet Take 2 tablets (40 mg total) by mouth daily. 04/03/16   Isaiah Serge, NP   BP 120/55 mmHg  Pulse 70  Temp(Src) 97.5 F (36.4 C) (Oral)  Resp 18  SpO2 98% Physical Exam  Constitutional: She is oriented to person, place, and time. She appears well-developed and well-nourished. No distress.  HENT:  Head: Normocephalic and atraumatic.  Eyes: Conjunctivae are normal. Right eye  exhibits no discharge. Left eye exhibits no discharge.  Neck: Normal range of motion. Neck supple.  Cardiovascular: Normal rate, regular rhythm and intact distal pulses.   Pulmonary/Chest: Effort normal and breath sounds normal. No respiratory distress.  Abdominal: Soft. Bowel sounds are normal. She exhibits no distension (RUQ mild tenderness, RLQ mild tenderness) and no mass. There is tenderness. There is no rebound and no guarding.  Neg cvat Neg murphys sign  Musculoskeletal: She exhibits no edema.  Neurological: She is alert and oriented to person, place, and time.  Skin: Skin is warm. No rash noted.  Psychiatric: She has a normal mood and affect.  Nursing note and vitals reviewed.   ED Course  Procedures (including critical care time) Labs Review Labs Reviewed  COMPREHENSIVE METABOLIC PANEL - Abnormal; Notable for the following:    Glucose, Bld 117 (*)    BUN 34 (*)    Creatinine, Ser 2.16 (*)    GFR calc non Af Amer 19 (*)    GFR calc Af Amer 22 (*)    All other components within normal limits  CBC WITH DIFFERENTIAL/PLATELET - Abnormal; Notable for the following:    Platelets 127 (*)    All other components within normal limits  URINALYSIS, ROUTINE W REFLEX MICROSCOPIC (NOT AT [facility]) - Abnormal; Notable for the following:    Leukocytes, UA TRACE (*)    All other components within normal limits  URINE MICROSCOPIC-ADD ON - Abnormal; Notable for the following:    Squamous Epithelial / LPF 0-5  (*)    Bacteria, UA RARE (*)    Casts HYALINE CASTS (*)    All other components within normal limits  LIPASE, BLOOD  I-STAT TROPOININ, ED    Imaging Review No results found. I have personally reviewed and evaluated these images and lab results as part of my medical decision-making.   EKG Interpretation   Date/Time:  Monday April 25 2016 22:19:06 EDT Ventricular Rate:  70 PR Interval:  156 QRS Duration: 162 QT Interval:  464 QTC Calculation: 501 R Axis:   87 Text Interpretation:  Electronic atrial pacemaker Left bundle branch block  Abnormal ECG No significant change since last tracing Confirmed by  Christy Gentles  MD, DONALD ([id number]) on 04/25/2016 11:34:53 PM      MDM   Final diagnoses:  Abdominal pain   Patient presents with diffuse epigastric, right upper quadrant, right lower quadrant abdominal pain. Exam is very benign but given her age, we'll get a CT of her abdomen. CT of her abdomen revealed questionable pneumonia versus infiltrates from edema. Patient does have CHF but states she has no shortness of breath. She also has no chest pain. CHF exacerbation. Doubt pneumonia as patient has no leukocytosis, she is satting well, afebrile, no cough. Will discharge patient and have her follow up with primary care provider tomorrow. Patient verbalized understanding to return for any fevers, shortness breath, chest pain, cough, or other concerning symptoms. Discharged in good condition.   Karma Greaser, MD 04/26/16 [id number]  Ripley Fraise, MD 04/26/16 [phone]

## 2016-04-25 NOTE — ED Notes (Addendum)
Patient with contrast, tolerating well. Per CT will need to drink for at least 90 minutes.

## 2016-04-26 ENCOUNTER — Emergency Department (HOSPITAL_COMMUNITY): Payer: Medicare Other

## 2016-04-26 NOTE — Discharge Instructions (Signed)

## 2016-04-26 NOTE — ED Notes (Signed)
Pt back from CT

## 2016-04-26 NOTE — ED Notes (Signed)
Patient transported to CT 

## 2016-04-27 ENCOUNTER — Encounter (HOSPITAL_COMMUNITY): Payer: Self-pay | Admitting: Internal Medicine

## 2016-04-27 ENCOUNTER — Telehealth (HOSPITAL_COMMUNITY): Payer: Self-pay | Admitting: Vascular Surgery

## 2016-04-27 ENCOUNTER — Ambulatory Visit (HOSPITAL_COMMUNITY)
Admission: RE | Admit: 2016-04-27 | Discharge: 2016-04-27 | Disposition: A | Discharge status: Home / Self Care | Payer: Medicare Other | Source: Ambulatory Visit | Attending: Internal Medicine | Admitting: Internal Medicine

## 2016-04-27 VITALS — BP 124/58 | HR 69 | Wt 133.2 lb

## 2016-04-27 DIAGNOSIS — Z85828 Personal history of other malignant neoplasm of skin: Secondary | ICD-10-CM | POA: Diagnosis not present

## 2016-04-27 DIAGNOSIS — K219 Gastro-esophageal reflux disease without esophagitis: Secondary | ICD-10-CM | POA: Insufficient documentation

## 2016-04-27 DIAGNOSIS — E039 Hypothyroidism, unspecified: Secondary | ICD-10-CM | POA: Insufficient documentation

## 2016-04-27 DIAGNOSIS — Z950 Presence of cardiac pacemaker: Secondary | ICD-10-CM | POA: Insufficient documentation

## 2016-04-27 DIAGNOSIS — E785 Hyperlipidemia, unspecified: Secondary | ICD-10-CM | POA: Diagnosis not present

## 2016-04-27 DIAGNOSIS — I480 Paroxysmal atrial fibrillation: Secondary | ICD-10-CM | POA: Diagnosis not present

## 2016-04-27 DIAGNOSIS — R001 Bradycardia, unspecified: Secondary | ICD-10-CM | POA: Diagnosis not present

## 2016-04-27 DIAGNOSIS — Z7901 Long term (current) use of anticoagulants: Secondary | ICD-10-CM | POA: Insufficient documentation

## 2016-04-27 DIAGNOSIS — N184 Chronic kidney disease, stage 4 (severe): Secondary | ICD-10-CM | POA: Insufficient documentation

## 2016-04-27 DIAGNOSIS — I130 Hypertensive heart and chronic kidney disease with heart failure and stage 1 through stage 4 chronic kidney disease, or unspecified chronic kidney disease: Secondary | ICD-10-CM | POA: Diagnosis not present

## 2016-04-27 DIAGNOSIS — Z79899 Other long term (current) drug therapy: Secondary | ICD-10-CM | POA: Diagnosis not present

## 2016-04-27 DIAGNOSIS — E876 Hypokalemia: Secondary | ICD-10-CM | POA: Insufficient documentation

## 2016-04-27 DIAGNOSIS — I5032 Chronic diastolic (congestive) heart failure: Secondary | ICD-10-CM | POA: Insufficient documentation

## 2016-04-27 NOTE — Telephone Encounter (Signed)
Left pt message to reschedule appt per DB

## 2016-04-27 NOTE — Progress Notes (Signed)
Patient ID: [name], female   DOB: 1925-06-18, 82 y.o.   MRN: [id number]   Advanced Heart Failure Clinic Note   PCP: Dr Netty Starring Primary Cardiologist: Dr. Harrington Challenger Primary HF: Dr. Haroldine Laws   HPI: [name] is a 82 y.o. female with the history of nonobstructive CAD by cardiac catheterization in 01/2014, persistent atrial fibrillation, diastolic CHF, HTN, HL, anemia, and CKD. Patient was admitted 04/2014 x2 with acute on chronic diastolic CHF. She underwent cardioversion with restoration of NSR. This resulted in profound bradycardia and hypotension requiring pacemaker implantation. Pacemaker implant was complicated by acute blood loss anemia requiring transfusion with PRBCs  Admitted to [facility] August 21 with increased dyspnea. Diuresed with IV lasix and transitioned to po lasix 40 mg twice a day. GI evaluated due to anemia and black stool. Had EGD with no acute findings. Colonoscopy was cancelled per daughter due to multiple medical problems. She continued on eliquis 2.5 mg twice a day.  Palliative Care consulted and the patient and family request aggressive care for heart failure. Discharge weight was 152 pounds.   Admitted to [facility] 11/9 after RHC due to elevated filling pressures. Once diuresed she was put on torsemide 20 mg twice a day. She was also restarted on amiodarone 200 mg daily and continue on eliquis 2.5 mg twice a day. Discharge weight was 141 pounds.   Admitted 12/12 to 12/17 with recurrent HF and AF. Admit weight was 136. Was diuresed to 131. Diuresis limited by low BP. Demadex increased from 20 bid to 40 bid.   Admitted 12/06/14 - 12/09/14 for ADHF. Initial weight 134 pounds. Diuresed down to 130 and felt better. Treatment limited due to hypotension and worsening renal failure with creatinine up to 2.6. Renal u/s ok. Metolazone stopped.  Admitted in 3/16 with hypotension. Started on midodrine 2.5 bid. Demadex cut back from 40 bid to 40 daily.   Admitted 5/17 - 5/18 with  recurrent CP. Myoview normal EF > 65% no ischemia.  Admitted 05/09/15 for chest pain. Demedex decreased to 30 mg daily with worsening creatinine. Troponin flat and without trend. D/c weight 133 lb  Admitted 01/21/16 with atypical chest pain. Recent cath, stress test reviewed with her and re-inforced that it is not cardiac related. Ruled out ACS, esophageal dysmotility, normal esophagogram.  Echo 01/22/16 LVEF 60-65%, grade 2 DD  Was in the ER on 4/23 for orthopnea. CXR with mild congestion. BNP up to 420 (baseline around 200). Was discharged home.   She returns today for HF follow up. Comes in for post ER f/u. Seen in ED yesterday for abdominal and chest pain. Troponin and abdominal CT normal. Question of iron infiltrate in liver (suspect it is actually amiodarone).Feels better today.  Otherwise stable. Still with mild dyspnea. Weight at home stable. Takes extra torsemide as needed. Taking all medications as directed.  No bleeding on eliquis.  Creatinine stable.    Studies:  - LHC (3/15): Mid LM 20-30%, mid RCA 30-40%, EF 65%  - Echo (4/15): Mild LVH, EF 60-65%, no RWMA, mild BAE, trivial eff  - Echo 5/15: EF 45-50% septl HK - Carotid US (5/15): 40-59% RICA stenosis. [id number] LICA stenosis.  - 5/16 Myoview EF > 65% normal perfusion - Cath 3/15:   Left mainstem: The mid left main has 20-30% stenosis and there is moderate calcification noted. Left anterior descending (LAD): There is mild diffuse plaquing without significant stenosis. The first diagonal is patent. Left circumflex (LCx): Left circumflex is patent. The first OM is  patent without significant stenosis. Right coronary artery (RCA): This is a large, dominant vessel. The vessel is moderately calcified. The mid vessel has 30-40% stenosis.   RHC 09/22/14  RA = 11 RV = 49/6/10 PA = 49/22 (34) PCW = 18 (v = 25) Fick cardiac output/index = 6.4/4.0 PVR = 2.5 WU FA sat = 95% PA sat = 72%,74%  Labs 9/15 K 3.5 Creatinine 1.44 Hgb 10.7   Labs 8/15 K 4.1 Creatinine 1.46  Hgb 9.8  Labs 8/15 TSH 2.7 Labs 10/15: K 4.0, creatinine 1.93 Labs 11/15 K 4.6 Creatinine 3.9  Labs 11/15 K 3.6 Creatinine 1.61 hgb 13.7 Labs 11/15 K 4.4 Creatinine 1.7 Hgb 14/1  Labs 12/15 K 3.9 Creatinine 1.4  Labs 2/16: K 4.2 Creatinine 2.22 Labs 5/16: K 3.5 Creatinine 1.8 Labs 6/16 K 3.1 Creatinine 2.29   ROS: All systems negative except as listed in HPI, PMH and Problem List.  Past Medical History  Diagnosis Date  . Dyslipidemia   . Hypertension   . Palpitations     PVCs/bigeminy on event in May 2009 revealing this was relatively asymptomatic  . Chest pain     a. 2011 Neg MV;  b. 01/2014 Cath: LM 20-30, LAD nl, D1 nl, LCX nl, OM1 nl, RCA dom 30-55m, PD/PL nl, EF 65%->Med Rx; c. 03/2016 MV: no ischemia/infarct. EF 63%.  . Degenerative joint disease   . Hx of varicella   . History of skin cancer     tafeen/ non melanoma.   . Thrombocytopenia ([place])   . Anemia   . Monoclonal gammopathies   . Right lower quadrant abdominal pain 07/19/2012    Recurrent  With nausea    This is new since she had ct for llq pain in [facility]    R/o appendiceal problem  Hernia  Get ct scan  And plan  Fu     . Diastolic CHF, acute on chronic ([place]) 01/31/2012  . Atrial fibrillation ([place])     a. Dx 01/2014->Eliquis started.  . Cancer ([place])   . Pacemaker   . Dysrhythmia   . Family history of adverse reaction to anesthesia     " multiple family members have difficulty waking "  . GERD (gastroesophageal reflux disease)   . Hypothyroidism     Current Outpatient Prescriptions  Medication Sig Dispense Refill  . acetaminophen (TYLENOL) 325 MG tablet Take 2 tablets (650 mg total) by mouth every 4 (four) hours as needed for headache or mild pain.    Marland Kitchen amiodarone (PACERONE) 200 MG tablet TAKE ONE TABLET BY MOUTH ONCE DAILY 30 tablet 5  . ELIQUIS 2.5 MG TABS tablet TAKE ONE TABLET BY MOUTH TWICE DAILY 60 tablet 6  . Ferrous Sulfate (IRON) 325 (65 Fe) MG TABS Take 1 tablet by  mouth 2 (two) times daily with a meal. 180 each 0  . levothyroxine (SYNTHROID, LEVOTHROID) 75 MCG tablet TAKE ONE TABLET BY MOUTH ONCE DAILY 90 tablet 3  . loratadine (CLARITIN) 10 MG tablet Take 10 mg by mouth at bedtime as needed for allergies or rhinitis.     Marland Kitchen meclizine (ANTIVERT) 12.5 MG tablet Take 1 tablet (12.5 mg total) by mouth 2 (two) times daily as needed for dizziness. 10 tablet 0  . pantoprazole (PROTONIX) 40 MG tablet Take 1 tablet (40 mg total) by mouth 2 (two) times daily. For 1 week then go back to 1 daily. 60 tablet 2  . potassium chloride SA (K-DUR,KLOR-CON) 20 MEQ tablet Take 3 tablets (60 mEq  total) by mouth daily. Take an additional 83meq with Metolazone. 90 tablet 2  . simvastatin (ZOCOR) 10 MG tablet TAKE ONE TABLET BY MOUTH AT BEDTIME 30 tablet 0  . torsemide (DEMADEX) 20 MG tablet Take 2 tablets (40 mg total) by mouth daily. 60 tablet 6  . metolazone (ZAROXOLYN) 2.5 MG tablet Take 1 tablet (2.5 mg total) by mouth as needed (for weight 134 lb or greater). (Patient not taking: Reported on 04/27/2016) 10 tablet 3  . nitroGLYCERIN (NITROSTAT) 0.4 MG SL tablet Place 1 tablet (0.4 mg total) under the tongue every 5 (five) minutes as needed. For chest pain (Patient not taking: Reported on 04/27/2016) 25 tablet 6   No current facility-administered medications for this encounter.   Filed Vitals:   04/27/16 1422  BP: 124/58  Pulse: 69  Weight: 133 lb 4 oz (60.442 kg)  SpO2: 97%    Wt Readings from Last 3 Encounters:  04/27/16 133 lb 4 oz (60.442 kg)  04/01/16 129 lb 1.6 oz (58.559 kg)  03/29/16 138 lb (62.596 kg)     PHYSICAL EXAM: General:  Elderly, frail appearing. No resp difficulty. Caregiver present. Ambulated into clinic using cane without difficulty.   HEENT: Normal Neck: supple. JVP flat. Carotids 2+ bilaterally; no bruits. No thyromegaly or nodule noted. Cor: PMI normal. RRR. No rubs, gallops. 1/6 diastolic murmur.  Lungs: CTAB, normal effort Abdomen: soft, NT,  ND, no HSM. No bruits or masses. +BS  Extremities: no cyanosis, clubbing, rash, R and LLE severe varicose veins. No edema noted. Neuro: alert & orientedx3, cranial nerves grossly intact. Moves all 4 extremities w/o difficulty. Affect pleasant.   ASSESSMENT & PLAN:  1. Chronic Diastolic Heart Failure - NYHA II-III.  - Volume status stable. Continue 40 mg torsemide daily.   - Continue to take metolazone (and KCL 20) only as needed for weight 134 or greater.  - Reinforced the need and importance of daily weights, a low sodium diet, and fluid restriction (less than 2 L a day). Instructed to call the HF clinic if weight increases more than 3 lbs overnight or 5 lbs in a week.  2.PAF - symptomatic bradycardia s/p PPM.   - Previously thought to have failed amiodarone and had chronic AF. However in November in the hospital she was in NSR so amio restarted.  - She remains in NSR by exam today - Continue amiodarone 200 mg daily. Continue Eliquis 2.5 mg twice a day. No bleeding problems.    3. CKD stage IV - Stable on recent hospital labs.  4. Deconditioning- Continue to increase activity as able.  5. Hypokalemia - Stable during recent admission. Continue kcl 6. Chest pain, atypical  - resolved. Stress test normal 5/16. Minimal CAD on cath 3/15 - had recent full GI work up as well, normal.    Follow up in 4 months.   Glori Bickers MD 3:07 PM

## 2016-04-29 ENCOUNTER — Ambulatory Visit: Payer: Medicare Other | Admitting: Internal Medicine

## 2016-04-29 LAB — CUP PACEART REMOTE DEVICE CHECK
Battery Remaining Longevity: 99 mo
Battery Remaining Percentage: 95.5 %
Battery Voltage: 2.98 V
Brady Statistic AP VP Percent: 99 %
Brady Statistic AS VS Percent: 1 %
Date Time Interrogation Session: 20170607080013
Implantable Lead Implant Date: 20150610
Implantable Lead Location: 753860
Implantable Lead Model: 5076
Lead Channel Impedance Value: 450 Ohm
Lead Channel Impedance Value: 510 Ohm
Lead Channel Pacing Threshold Amplitude: 0.5 V
Lead Channel Pacing Threshold Pulse Width: 0.4 ms
Lead Channel Sensing Intrinsic Amplitude: 5 mV
Lead Channel Setting Pacing Amplitude: 2 V
Lead Channel Setting Pacing Amplitude: 2.5 V
Lead Channel Setting Pacing Pulse Width: 0.4 ms
MDC IDC LEAD IMPLANT DT: 20150610
MDC IDC LEAD LOCATION: 753859
MDC IDC MSMT LEADCHNL RA PACING THRESHOLD PULSEWIDTH: 0.4 ms
MDC IDC MSMT LEADCHNL RV PACING THRESHOLD AMPLITUDE: 1.25 V
MDC IDC MSMT LEADCHNL RV SENSING INTR AMPL: 4.3 mV
MDC IDC PG SERIAL: 7627922
MDC IDC SET LEADCHNL RV SENSING SENSITIVITY: 2 mV
MDC IDC STAT BRADY AP VS PERCENT: 1 %
MDC IDC STAT BRADY AS VP PERCENT: 1 %
MDC IDC STAT BRADY RA PERCENT PACED: 99 %
MDC IDC STAT BRADY RV PERCENT PACED: 99 %

## 2016-05-02 ENCOUNTER — Encounter (HOSPITAL_COMMUNITY): Payer: Self-pay | Admitting: Emergency Medicine

## 2016-05-02 ENCOUNTER — Telehealth: Payer: Self-pay | Admitting: Internal Medicine

## 2016-05-02 ENCOUNTER — Emergency Department (HOSPITAL_COMMUNITY): Payer: Medicare Other

## 2016-05-02 ENCOUNTER — Emergency Department (HOSPITAL_COMMUNITY)
Admission: EM | Admit: 2016-05-02 | Discharge: 2016-05-02 | Disposition: A | Discharge status: Home / Self Care | Payer: Medicare Other | Attending: Emergency Medicine | Admitting: Emergency Medicine

## 2016-05-02 DIAGNOSIS — R300 Dysuria: Secondary | ICD-10-CM | POA: Diagnosis not present

## 2016-05-02 DIAGNOSIS — I110 Hypertensive heart disease with heart failure: Secondary | ICD-10-CM | POA: Insufficient documentation

## 2016-05-02 DIAGNOSIS — R1032 Left lower quadrant pain: Secondary | ICD-10-CM | POA: Insufficient documentation

## 2016-05-02 DIAGNOSIS — I4891 Unspecified atrial fibrillation: Secondary | ICD-10-CM | POA: Insufficient documentation

## 2016-05-02 DIAGNOSIS — E039 Hypothyroidism, unspecified: Secondary | ICD-10-CM | POA: Diagnosis not present

## 2016-05-02 DIAGNOSIS — Z950 Presence of cardiac pacemaker: Secondary | ICD-10-CM | POA: Insufficient documentation

## 2016-05-02 DIAGNOSIS — R197 Diarrhea, unspecified: Secondary | ICD-10-CM | POA: Insufficient documentation

## 2016-05-02 DIAGNOSIS — Z85828 Personal history of other malignant neoplasm of skin: Secondary | ICD-10-CM | POA: Insufficient documentation

## 2016-05-02 DIAGNOSIS — I5033 Acute on chronic diastolic (congestive) heart failure: Secondary | ICD-10-CM | POA: Insufficient documentation

## 2016-05-02 DIAGNOSIS — R109 Unspecified abdominal pain: Secondary | ICD-10-CM

## 2016-05-02 DIAGNOSIS — E785 Hyperlipidemia, unspecified: Secondary | ICD-10-CM | POA: Insufficient documentation

## 2016-05-02 DIAGNOSIS — M1990 Unspecified osteoarthritis, unspecified site: Secondary | ICD-10-CM | POA: Diagnosis not present

## 2016-05-02 DIAGNOSIS — Z79899 Other long term (current) drug therapy: Secondary | ICD-10-CM | POA: Diagnosis not present

## 2016-05-02 DIAGNOSIS — R1084 Generalized abdominal pain: Secondary | ICD-10-CM

## 2016-05-02 LAB — COMPREHENSIVE METABOLIC PANEL
ALK PHOS: 84 U/L (ref 38–126)
ALT: 16 U/L (ref 14–54)
AST: 18 U/L (ref 15–41)
Albumin: 4.2 g/dL (ref 3.5–5.0)
Anion gap: 6 (ref 5–15)
BUN: 26 mg/dL — AB (ref 6–20)
CALCIUM: 9.8 mg/dL (ref 8.9–10.3)
CO2: 28 mmol/L (ref 22–32)
CREATININE: 1.89 mg/dL — AB (ref 0.44–1.00)
Chloride: 108 mmol/L (ref 101–111)
GFR calc non Af Amer: 22 mL/min — ABNORMAL LOW (ref >60)
GFR, EST AFRICAN AMERICAN: 26 mL/min — AB (ref >60)
Glucose, Bld: 107 mg/dL — ABNORMAL HIGH (ref 65–99)
Potassium: 4.6 mmol/L (ref 3.5–5.1)
SODIUM: 142 mmol/L (ref 135–145)
Total Bilirubin: 1 mg/dL (ref 0.3–1.2)
Total Protein: 7.2 g/dL (ref 6.5–8.1)

## 2016-05-02 LAB — URINALYSIS, ROUTINE W REFLEX MICROSCOPIC
BILIRUBIN URINE: NEGATIVE
GLUCOSE, UA: NEGATIVE mg/dL
HGB URINE DIPSTICK: NEGATIVE
KETONES UR: NEGATIVE mg/dL
Leukocytes, UA: NEGATIVE
Nitrite: NEGATIVE
PROTEIN: NEGATIVE mg/dL
Specific Gravity, Urine: 1.007 (ref 1.005–1.030)
pH: 5.5 (ref 5.0–8.0)

## 2016-05-02 LAB — CBC WITH DIFFERENTIAL/PLATELET
Basophils Absolute: 0 10*3/uL (ref 0.0–0.1)
Basophils Relative: 1 %
EOS ABS: 0 10*3/uL (ref 0.0–0.7)
EOS PCT: 1 %
HCT: 42.1 % (ref 36.0–46.0)
Hemoglobin: 13.5 g/dL (ref 12.0–15.0)
LYMPHS ABS: 0.8 10*3/uL (ref 0.7–4.0)
Lymphocytes Relative: 15 %
MCH: 31 pg (ref 26.0–34.0)
MCHC: 32.1 g/dL (ref 30.0–36.0)
MCV: 96.8 fL (ref 78.0–100.0)
MONO ABS: 0.4 10*3/uL (ref 0.1–1.0)
MONOS PCT: 7 %
Neutro Abs: 4.2 10*3/uL (ref 1.7–7.7)
Neutrophils Relative %: 76 %
PLATELETS: 136 10*3/uL — AB (ref 150–400)
RBC: 4.35 MIL/uL (ref 3.87–5.11)
RDW: 14.3 % (ref 11.5–15.5)
WBC: 5.4 10*3/uL (ref 4.0–10.5)

## 2016-05-02 LAB — ABO/RH: ABO/RH(D): B POS

## 2016-05-02 LAB — LIPASE, BLOOD: Lipase: 27 U/L (ref 11–51)

## 2016-05-02 LAB — TYPE AND SCREEN
ABO/RH(D): B POS
Antibody Screen: NEGATIVE

## 2016-05-02 LAB — POC OCCULT BLOOD, ED: FECAL OCCULT BLD: NEGATIVE

## 2016-05-02 MED ORDER — DIATRIZOATE MEGLUMINE & SODIUM 66-10 % PO SOLN: 15.0000 mL | Freq: Once | ORAL | Status: DC | PRN

## 2016-05-02 MED ORDER — ACETAMINOPHEN 500 MG PO TABS
500.0000 mg | ORAL_TABLET | Freq: Once | ORAL | Status: AC
  Administered 2016-05-02: 500 mg via ORAL
  Filled 2016-05-02: qty 1

## 2016-05-02 NOTE — ED Notes (Signed)
Per EMS,  Patient complaining of abdominal pain x 4 days on & off.  She states she has had dark tarry stool for 4 days.  She rates her pain 5 ou of 10.  Denies any n/v.  Per EMS, abdomen is soft and tender at umbilical area.    BP:  118/54 HR:72 R:18  97% on room air

## 2016-05-02 NOTE — Telephone Encounter (Signed)
Patient Name: [name]  DOB: 03/04/1925    Initial Comment caller states she has diarrhea, abd pain and dizziness   Nurse Assessment  Nurse: Raphael Gibney, RN, Vanita Ingles Date/Time (Eastern Time): 05/02/2016 11:29:54 AM  Confirm and document reason for call. If symptomatic, describe symptoms. You must click the next button to save text entered. ---Caller states she has diarrhea and abd pain. Had diarrhea last week and it stopped. Diarrhea started again yesterday. Stools are black. Had severe abd pain last night and moderate pain today. She is lightheaded which she has had before. Has had 7 stools in the past 24 hrs. Has urinated.  Has the patient traveled out of the country within the last 30 days? ---Not Applicable  Does the patient have any new or worsening symptoms? ---Yes  Will a triage be completed? ---Yes  Related visit to physician within the last 2 weeks? ---No  Does the PT have any chronic conditions? (i.e. diabetes, asthma, etc.) ---Yes  List chronic conditions. ---heart condition, she is on eliquis  Is this a behavioral health or substance abuse call? ---No     Guidelines    Guideline Title Affirmed Question Affirmed Notes  Diarrhea Black or tarry bowel movements (Exception: chronic-unchanged black-grey bowel movements AND is taking iron pills or Pepto-Bismol)    Final Disposition User   Go to ED Now Raphael Gibney, RN, [facility] - ED   Disagree/Comply: Comply

## 2016-05-02 NOTE — ED Provider Notes (Signed)
CSN: [id number]     Arrival date & time 05/02/16  1313 History   First MD Initiated Contact with Patient 05/02/16 1347     Chief Complaint  Patient presents with  . Abdominal Pain     (Consider location/radiation/quality/duration/timing/severity/associated sxs/prior Treatment) HPI  82 year old female with a history of atrial fibrillation who is currently on Eliquis, as well as CHF, chronic kidney disease, and anemia presents with dark, tarry stools and abdominal pain over the last 3 days. Was seen here one week ago for abdominal pain but states this is different. Has not noticed bright red blood but every stool is black and tarry. Pain is mostly upper abdomen. No nausea or vomiting. Chronically has dizziness but states it is not worse than typical. This disease has been attributed to vertigo in the past. Patient rates her pain as a 5/10. It feels like a throbbing sensation. Has been having some burning with urination, most recently this AM. Cannot tell me for how long this has been a problem.  Past Medical History  Diagnosis Date  . Dyslipidemia   . Hypertension   . Palpitations     PVCs/bigeminy on event in May 2009 revealing this was relatively asymptomatic  . Chest pain     a. 2011 Neg MV;  b. 01/2014 Cath: LM 20-30, LAD nl, D1 nl, LCX nl, OM1 nl, RCA dom 30-8m, PD/PL nl, EF 65%->Med Rx; c. 03/2016 MV: no ischemia/infarct. EF 63%.  . Degenerative joint disease   . Hx of varicella   . History of skin cancer     tafeen/ non melanoma.   . Thrombocytopenia ([place])   . Anemia   . Monoclonal gammopathies   . Right lower quadrant abdominal pain 07/19/2012    Recurrent  With nausea    This is new since she had ct for llq pain in [facility]    R/o appendiceal problem  Hernia  Get ct scan  And plan  Fu     . Diastolic CHF, acute on chronic ([place]) 01/31/2012  . Atrial fibrillation ([place])     a. Dx 01/2014->Eliquis started.  . Cancer ([place])   . Pacemaker   . Dysrhythmia   . Family history of adverse  reaction to anesthesia     " multiple family members have difficulty waking "  . GERD (gastroesophageal reflux disease)   . Hypothyroidism    Past Surgical History  Procedure Laterality Date  . Cholecystectomy  1999  . Cesarean section      times 2  . Shoulder surgery  1996    Right   . Cataract extraction      Bilateral  implantt  . Cardioversion N/A 02/26/2014    Procedure: CARDIOVERSION AT BEDSIDE;  Surgeon: Pixie Casino, MD;  Location: [facility];  Service: Cardiovascular;  Laterality: N/A;  . Cardioversion N/A 04/22/2014    Procedure: CARDIOVERSION (BEDSIDE);  Surgeon: [name], MD;  Location: [facility] OR;  Service: Cardiovascular;  Laterality: N/A;  . Pacemaker insertion  04/23/2014    STJ Assurity dual chamber pacemaker implanted by Dr Rayann Heman  . Insert / replace / remove pacemaker    . Esophagogastroduodenoscopy N/A 07/06/2014    Procedure: ESOPHAGOGASTRODUODENOSCOPY (EGD);  Surgeon: Ladene Artist, MD;  Location: [facility] ENDOSCOPY;  Service: Endoscopy;  Laterality: N/A;  . Left heart catheterization with coronary angiogram N/A 01/29/2014    Procedure: LEFT HEART CATHETERIZATION WITH CORONARY ANGIOGRAM;  Surgeon: Blane Ohara, MD;  Location: [facility] CATH LAB;  Service: Cardiovascular;  Laterality: N/A;  . Permanent pacemaker insertion N/A 04/23/2014    Procedure: PERMANENT PACEMAKER INSERTION;  Surgeon: Evans Lance, MD;  Location: [facility] CATH LAB;  Service: Cardiovascular;  Laterality: N/A;  . Right heart catheterization N/A 09/22/2014    Procedure: RIGHT HEART CATH;  Surgeon: Jolaine Artist, MD;  Location: [facility] CATH LAB;  Service: Cardiovascular;  Laterality: N/A;   Family History  Problem Relation Age of Onset  . Coronary artery disease Father   . Heart disease Father   . Lung cancer    . Alzheimer's disease Mother   . Cancer Brother 27    lung cancer  . Heart attack Father    Social History  Substance Use Topics  . Smoking status: Never Smoker   . Smokeless tobacco: Never Used   . Alcohol Use: No   OB History    No data available     Review of Systems  Respiratory: Negative for shortness of breath.   Cardiovascular: Negative for chest pain.  Gastrointestinal: Positive for abdominal pain and diarrhea. Negative for nausea and vomiting.       Black/tarry stool  Genitourinary: Positive for dysuria.  All other systems reviewed and are negative.     Allergies  Other; Tramadol; and Ibuprofen  Home Medications   Prior to Admission medications   Medication Sig Start Date End Date Taking? Authorizing Provider  acetaminophen (TYLENOL) 325 MG tablet Take 2 tablets (650 mg total) by mouth every 4 (four) hours as needed for headache or mild pain. 09/26/14   Isaiah Serge, NP  amiodarone (PACERONE) 200 MG tablet TAKE ONE TABLET BY MOUTH ONCE DAILY 11/17/15   Evans Lance, MD  ELIQUIS 2.5 MG TABS tablet TAKE ONE TABLET BY MOUTH TWICE DAILY 03/14/16   Jolaine Artist, MD  Ferrous Sulfate (IRON) 325 (65 Fe) MG TABS Take 1 tablet by mouth 2 (two) times daily with a meal. 04/08/16   Isaiah Serge, NP  levothyroxine (SYNTHROID, LEVOTHROID) 75 MCG tablet TAKE ONE TABLET BY MOUTH ONCE DAILY 02/29/16   Burnis Medin, MD  loratadine (CLARITIN) 10 MG tablet Take 10 mg by mouth at bedtime as needed for allergies or rhinitis.     Historical Provider, MD  meclizine (ANTIVERT) 12.5 MG tablet Take 1 tablet (12.5 mg total) by mouth 2 (two) times daily as needed for dizziness. 03/09/16   Jolaine Artist, MD  metolazone (ZAROXOLYN) 2.5 MG tablet Take 1 tablet (2.5 mg total) by mouth as needed (for weight 134 lb or greater). Patient not taking: Reported on 04/27/2016 03/09/16   Jolaine Artist, MD  nitroGLYCERIN (NITROSTAT) 0.4 MG SL tablet Place 1 tablet (0.4 mg total) under the tongue every 5 (five) minutes as needed. For chest pain Patient not taking: Reported on 04/27/2016 12/02/13   Fay Records, MD  pantoprazole (PROTONIX) 40 MG tablet Take 1 tablet (40 mg total) by mouth 2 (two)  times daily. For 1 week then go back to 1 daily. 04/19/16   Burnis Medin, MD  potassium chloride SA (K-DUR,KLOR-CON) 20 MEQ tablet Take 3 tablets (60 mEq total) by mouth daily. Take an additional 6meq with Metolazone. 04/03/16   Isaiah Serge, NP  simvastatin (ZOCOR) 10 MG tablet TAKE ONE TABLET BY MOUTH AT BEDTIME 04/26/16   Jolaine Artist, MD  torsemide (DEMADEX) 20 MG tablet Take 2 tablets (40 mg total) by mouth daily. 04/03/16   Isaiah Serge, NP   BP 119/54 mmHg  Pulse  80  Temp(Src) 97.8 F (36.6 C) (Oral)  Resp 16  Ht 4\' 10"  (1.473 m)  Wt 133 lb 8 oz (60.555 kg)  BMI 27.91 kg/m2  SpO2 95% Physical Exam  Constitutional: She is oriented to person, place, and time. She appears well-developed and well-nourished. No distress.  HENT:  Head: Normocephalic and atraumatic.  Right Ear: External ear normal.  Left Ear: External ear normal.  Nose: Nose normal.  Eyes: Right eye exhibits no discharge. Left eye exhibits no discharge.  Cardiovascular: Normal rate and regular rhythm.   Murmur heard. Pulmonary/Chest: Effort normal and breath sounds normal.  Abdominal: Soft. There is tenderness (diffuse tenderness, worst in LLQ).  Genitourinary: Rectal exam shows external hemorrhoid (non-thrombosed, non-tender). Rectal exam shows no internal hemorrhoid.  Dark stool on finger on rectal exam  Neurological: She is alert and oriented to person, place, and time.  Skin: Skin is warm and dry. She is not diaphoretic.  Nursing note and vitals reviewed.   ED Course  Procedures (including critical care time) Labs Review Labs Reviewed  COMPREHENSIVE METABOLIC PANEL - Abnormal; Notable for the following:    Glucose, Bld 107 (*)    BUN 26 (*)    Creatinine, Ser 1.89 (*)    GFR calc non Af Amer 22 (*)    GFR calc Af Amer 26 (*)    All other components within normal limits  CBC WITH DIFFERENTIAL/PLATELET - Abnormal; Notable for the following:    Platelets 136 (*)    All other components within  normal limits  LIPASE, BLOOD  URINALYSIS, ROUTINE W REFLEX MICROSCOPIC (NOT AT [facility])  POC OCCULT BLOOD, ED  TYPE AND SCREEN  ABO/RH    Imaging Review Ct Abdomen Pelvis Wo Contrast  05/02/2016  CLINICAL DATA:  Abdominal pain, dark stools. EXAM: CT ABDOMEN AND PELVIS WITHOUT CONTRAST TECHNIQUE: Multidetector CT imaging of the abdomen and pelvis was performed following the standard protocol without IV contrast. COMPARISON:  04/26/2016 FINDINGS: Lower chest: Interstitial thickening and scarring noted in the lung bases bilaterally. Heart is mildly enlarged. Diffuse coronary artery calcifications in the visualize coronary vessels. Hepatobiliary: Prior cholecystectomy. Low-density lesion in the right hepatic lobe measures 13 mm, likely cyst but difficult to characterize without intravenous contrast. Pancreas: No focal abnormality or ductal dilatation. Spleen: No focal abnormality.  Normal size. Adrenals/Urinary Tract: Duplicated left renal collecting system with hydronephrosis in the lower pole moiety, stable since prior study. No hydronephrosis on the right. Upper pole cyst on the right. Adrenal glands and urinary bladder grossly unremarkable. Stomach/Bowel: Sigmoid diverticulosis. No active diverticulitis. Probable lipoma within the right colonic wall. No evidence of bowel obstruction. Vascular/Lymphatic: Diffusely calcified aorta and iliac vessels which are tortuous. No aneurysm. No adenopathy. Reproductive: No visible focal abnormality.  No adnexal masses. Other: No free fluid or free air. Musculoskeletal: No acute bony abnormality. Diffuse degenerative changes and scoliosis in the lumbar spine. IMPRESSION: Sigmoid diverticulosis.  No active diverticulitis. Diffuse aortic atherosclerosis. Interstitial prominence and probable scarring in the lung bases. Cardiomegaly, coronary artery disease. No acute findings in the abdomen or pelvis. Electronically Signed   By: Rolm Baptise M.D.   On: 05/02/2016 15:17   I  have personally reviewed and evaluated these images and lab results as part of my medical decision-making.   EKG Interpretation None      MDM   Final diagnoses:  Abdominal pain, unspecified abdominal location  Diarrhea, unspecified type    CT scan shows no obvious intra-abdominal pathology. She has dark stool  per report but no heme positive stool. All she describes as dark and tarry her hemoglobin is normal and it does not seem consistent with melena. Likely this is related to a diarrheal illness. She feels better after Tylenol and wants to go home. Given her unremarkable blood work with no significant change from before I do not think any further treatment or workup is warranted at this time. Follow-up with PCP and discussed return precautions.    Sherwood Gambler, MD 05/02/16 2240

## 2016-05-02 NOTE — ED Notes (Signed)
Bed: WA04 Expected date:  Expected time:  Means of arrival:  Comments: 

## 2016-05-03 NOTE — Telephone Encounter (Signed)
Pt seen in ED 

## 2016-05-04 ENCOUNTER — Encounter: Payer: Self-pay | Admitting: Cardiology

## 2016-05-06 ENCOUNTER — Other Ambulatory Visit: Payer: Self-pay | Admitting: Adult Health

## 2016-05-06 ENCOUNTER — Other Ambulatory Visit (HOSPITAL_COMMUNITY): Payer: Self-pay | Admitting: Pharmacist

## 2016-05-06 MED ORDER — NITROGLYCERIN 0.4 MG SL SUBL: 0.4000 mg | SUBLINGUAL_TABLET | SUBLINGUAL | Status: AC | PRN

## 2016-05-16 ENCOUNTER — Ambulatory Visit: Payer: Medicare Other | Admitting: Internal Medicine

## 2016-05-18 ENCOUNTER — Telehealth: Payer: Self-pay

## 2016-05-18 NOTE — Telephone Encounter (Signed)
Pt called stating that she had arm pain on side of device last night while showering. Pt describes pain as the same as the pain she experiences in her legs d/t varicose veins. Pt denies redness,swelling, drainage of the device site. Pt to send in remote transmission. Will call pt if remote transmission is abnormal otherwise pt encouraged to call her PCP.

## 2016-05-19 ENCOUNTER — Encounter: Payer: Self-pay | Admitting: Cardiology

## 2016-05-21 ENCOUNTER — Other Ambulatory Visit: Payer: Self-pay | Admitting: Internal Medicine

## 2016-05-30 ENCOUNTER — Other Ambulatory Visit (HOSPITAL_COMMUNITY): Payer: Self-pay | Admitting: Internal Medicine

## 2016-06-13 ENCOUNTER — Encounter: Payer: Self-pay | Admitting: Family Medicine

## 2016-06-30 ENCOUNTER — Encounter (HOSPITAL_COMMUNITY): Payer: Self-pay

## 2016-06-30 ENCOUNTER — Emergency Department (HOSPITAL_COMMUNITY): Payer: Medicare Other

## 2016-06-30 ENCOUNTER — Observation Stay (HOSPITAL_COMMUNITY)
Admission: EM | Admit: 2016-06-30 | Discharge: 2016-07-01 | Disposition: A | Discharge status: Home Health | Payer: Medicare Other | Attending: Family Medicine | Admitting: Family Medicine

## 2016-06-30 DIAGNOSIS — Z950 Presence of cardiac pacemaker: Secondary | ICD-10-CM | POA: Diagnosis not present

## 2016-06-30 DIAGNOSIS — I2510 Atherosclerotic heart disease of native coronary artery without angina pectoris: Secondary | ICD-10-CM | POA: Insufficient documentation

## 2016-06-30 DIAGNOSIS — I5042 Chronic combined systolic (congestive) and diastolic (congestive) heart failure: Secondary | ICD-10-CM | POA: Diagnosis not present

## 2016-06-30 DIAGNOSIS — I130 Hypertensive heart and chronic kidney disease with heart failure and stage 1 through stage 4 chronic kidney disease, or unspecified chronic kidney disease: Secondary | ICD-10-CM | POA: Diagnosis not present

## 2016-06-30 DIAGNOSIS — R079 Chest pain, unspecified: Secondary | ICD-10-CM

## 2016-06-30 DIAGNOSIS — I480 Paroxysmal atrial fibrillation: Secondary | ICD-10-CM | POA: Diagnosis present

## 2016-06-30 DIAGNOSIS — D631 Anemia in chronic kidney disease: Secondary | ICD-10-CM | POA: Insufficient documentation

## 2016-06-30 DIAGNOSIS — Z85828 Personal history of other malignant neoplasm of skin: Secondary | ICD-10-CM | POA: Insufficient documentation

## 2016-06-30 DIAGNOSIS — E785 Hyperlipidemia, unspecified: Secondary | ICD-10-CM | POA: Diagnosis not present

## 2016-06-30 DIAGNOSIS — K219 Gastro-esophageal reflux disease without esophagitis: Secondary | ICD-10-CM | POA: Insufficient documentation

## 2016-06-30 DIAGNOSIS — N184 Chronic kidney disease, stage 4 (severe): Secondary | ICD-10-CM | POA: Diagnosis present

## 2016-06-30 DIAGNOSIS — Z7901 Long term (current) use of anticoagulants: Secondary | ICD-10-CM | POA: Insufficient documentation

## 2016-06-30 DIAGNOSIS — Z886 Allergy status to analgesic agent status: Secondary | ICD-10-CM | POA: Insufficient documentation

## 2016-06-30 DIAGNOSIS — I10 Essential (primary) hypertension: Secondary | ICD-10-CM | POA: Diagnosis not present

## 2016-06-30 DIAGNOSIS — R0789 Other chest pain: Principal | ICD-10-CM | POA: Diagnosis present

## 2016-06-30 DIAGNOSIS — E039 Hypothyroidism, unspecified: Secondary | ICD-10-CM | POA: Diagnosis not present

## 2016-06-30 DIAGNOSIS — I5032 Chronic diastolic (congestive) heart failure: Secondary | ICD-10-CM | POA: Diagnosis present

## 2016-06-30 LAB — CBC WITH DIFFERENTIAL/PLATELET
BASOS ABS: 0 10*3/uL (ref 0.0–0.1)
BASOS PCT: 1 %
Eosinophils Absolute: 0.1 10*3/uL (ref 0.0–0.7)
Eosinophils Relative: 2 %
HEMATOCRIT: 38.8 % (ref 36.0–46.0)
Hemoglobin: 12.2 g/dL (ref 12.0–15.0)
Lymphocytes Relative: 11 %
Lymphs Abs: 0.6 10*3/uL — ABNORMAL LOW (ref 0.7–4.0)
MCH: 30.9 pg (ref 26.0–34.0)
MCHC: 31.4 g/dL (ref 30.0–36.0)
MCV: 98.2 fL (ref 78.0–100.0)
MONO ABS: 0.6 10*3/uL (ref 0.1–1.0)
MONOS PCT: 11 %
Neutro Abs: 3.9 10*3/uL (ref 1.7–7.7)
Neutrophils Relative %: 75 %
Platelets: 122 10*3/uL — ABNORMAL LOW (ref 150–400)
RBC: 3.95 MIL/uL (ref 3.87–5.11)
RDW: 14.2 % (ref 11.5–15.5)
WBC: 5.2 10*3/uL (ref 4.0–10.5)

## 2016-06-30 LAB — COMPREHENSIVE METABOLIC PANEL
ALBUMIN: 3.4 g/dL — AB (ref 3.5–5.0)
ALK PHOS: 71 U/L (ref 38–126)
ALT: 13 U/L — AB (ref 14–54)
AST: 16 U/L (ref 15–41)
Anion gap: 5 (ref 5–15)
BILIRUBIN TOTAL: 0.7 mg/dL (ref 0.3–1.2)
BUN: 32 mg/dL — AB (ref 6–20)
CALCIUM: 8.6 mg/dL — AB (ref 8.9–10.3)
CO2: 23 mmol/L (ref 22–32)
CREATININE: 2.05 mg/dL — AB (ref 0.44–1.00)
Chloride: 110 mmol/L (ref 101–111)
GFR calc Af Amer: 24 mL/min — ABNORMAL LOW (ref >60)
GFR calc non Af Amer: 20 mL/min — ABNORMAL LOW (ref >60)
GLUCOSE: 114 mg/dL — AB (ref 65–99)
Potassium: 4.4 mmol/L (ref 3.5–5.1)
SODIUM: 138 mmol/L (ref 135–145)
TOTAL PROTEIN: 6.2 g/dL — AB (ref 6.5–8.1)

## 2016-06-30 LAB — I-STAT TROPONIN, ED
TROPONIN I, POC: 0.03 ng/mL (ref 0.00–0.08)
Troponin i, poc: 0.04 ng/mL (ref 0.00–0.08)

## 2016-06-30 LAB — LIPASE, BLOOD: LIPASE: 32 U/L (ref 11–51)

## 2016-06-30 MED ORDER — SIMVASTATIN 10 MG PO TABS: 10.0000 mg | ORAL_TABLET | Freq: Every day | ORAL | Status: DC

## 2016-06-30 MED ORDER — TORSEMIDE 20 MG PO TABS
40.0000 mg | ORAL_TABLET | Freq: Every day | ORAL | Status: DC
Start: 2016-07-01 — End: 2016-07-01
  Administered 2016-07-01: 40 mg via ORAL
  Filled 2016-06-30: qty 2

## 2016-06-30 MED ORDER — GI COCKTAIL ~~LOC~~: 30.0000 mL | Freq: Four times a day (QID) | ORAL | Status: DC | PRN

## 2016-06-30 MED ORDER — FERROUS SULFATE 325 (65 FE) MG PO TABS
325.0000 mg | ORAL_TABLET | Freq: Two times a day (BID) | ORAL | Status: DC
  Administered 2016-07-01 (×2): 325 mg via ORAL
  Filled 2016-06-30 (×2): qty 1

## 2016-06-30 MED ORDER — ACETAMINOPHEN 325 MG PO TABS: 650.0000 mg | ORAL_TABLET | ORAL | Status: DC | PRN

## 2016-06-30 MED ORDER — POTASSIUM CHLORIDE CRYS ER 20 MEQ PO TBCR
60.0000 meq | EXTENDED_RELEASE_TABLET | Freq: Every day | ORAL | Status: DC
  Administered 2016-07-01: 60 meq via ORAL
  Filled 2016-06-30: qty 3

## 2016-06-30 MED ORDER — PANTOPRAZOLE SODIUM 40 MG PO TBEC
40.0000 mg | DELAYED_RELEASE_TABLET | Freq: Every day | ORAL | Status: DC
  Administered 2016-07-01: 40 mg via ORAL
  Filled 2016-06-30: qty 1

## 2016-06-30 MED ORDER — AMIODARONE HCL 200 MG PO TABS
200.0000 mg | ORAL_TABLET | Freq: Every day | ORAL | Status: DC
  Administered 2016-07-01: 200 mg via ORAL
  Filled 2016-06-30: qty 1

## 2016-06-30 MED ORDER — APIXABAN 2.5 MG PO TABS
2.5000 mg | ORAL_TABLET | Freq: Two times a day (BID) | ORAL | Status: DC
  Administered 2016-07-01 (×2): 2.5 mg via ORAL
  Filled 2016-06-30 (×2): qty 1

## 2016-06-30 MED ORDER — LEVOTHYROXINE SODIUM 75 MCG PO TABS
75.0000 ug | ORAL_TABLET | Freq: Every day | ORAL | Status: DC
  Administered 2016-07-01: 75 ug via ORAL
  Filled 2016-06-30: qty 1

## 2016-06-30 MED ORDER — ONDANSETRON HCL 4 MG/2ML IJ SOLN: 4.0000 mg | Freq: Four times a day (QID) | INTRAMUSCULAR | Status: DC | PRN

## 2016-06-30 NOTE — ED Notes (Signed)
Pt to xray

## 2016-06-30 NOTE — ED Triage Notes (Signed)
Per EMS - pt from home. Pt reports new onset substernal CP starting around 83 today. Took 1 nitro by herself, 1 nitro w/ Fire, 1 nitro w/ EMS - minimal relief. Also took 324mg  aspirin. Rates CP 8/10 at this time. C/o some nausea and mild shortness of breath w/ cp. Hx CHF, COPD, afib, defibrillator/pacemaker placed.

## 2016-06-30 NOTE — H&P (Signed)
History and Physical  Patient Name: [name]     [id number]    DOB: Dec 08, 1924    DOA: 06/30/2016 PCP: [name], MD   Patient coming from: Home     Chief Complaint: Chest pain  HPI: [name] is a 82 y.o. female with a past medical history significant for Afib with pacer, chronic diastolic CHF, CKD IV, and HTN who presents with chest pain.  The patient was in her usual state of health until this afternoon at 4PM she was reading a magazine when she had onset of sharp substernal CP.  This was severe in intensity, worse than anything she has had before, moved to the right lower ribs, then moved to the left lower ribs, and resolved spontaneously after about 20 minutes.  The pain was severe enough that she told her daughter, who called EMS.  The pain returned but then subsided again once she got to the ER.  ED course: -Afebrile, heart rate 70s, respirations 17, BP 120/60, and oxygen saturation normal -Initial ECG showed paced rhythm and troponin was negative. -Na 138, K 4.4, Cr 2.05 (baseline 1.9-2.3), WBC 5.2, Hgb 12, lipase normal and POC troponin WNL -CXR showed old atelectasis vs infiltrate in LL -Aspirin was given -EDP had low index of suspicion for thoracic aortic dissection, but asked TRH to observe for possible dissection  The patient was admitted for atypical chest pain in March of this year, had a normal echocardiogram.  She was admitted by Cardiology in May of this year, had a low risk stress test.       Review of Systems:  Pt complains of migratory chest pain, nausea, malaise. Pt denies any shortness of breath, pleuritic pain, leg swelling, palpitations. Denies fever, cough, dyspnea, sputum.  All other systems negative except as just noted or noted in the history of present illness.  Past Medical History:  Diagnosis Date  . Anemia   . Atrial fibrillation ([place])    a. Dx 01/2014->Eliquis started.  . Cancer ([place])   . Chest pain    a. 2011  Neg MV;  b. 01/2014 Cath: LM 20-30, LAD nl, D1 nl, LCX nl, OM1 nl, RCA dom 30-106m, PD/PL nl, EF 65%->Med Rx; c. 03/2016 MV: no ischemia/infarct. EF 63%.  . Degenerative joint disease   . Diastolic CHF, acute on chronic ([place]) 01/31/2012  . Dyslipidemia   . Dysrhythmia   . Family history of adverse reaction to anesthesia    " multiple family members have difficulty waking "  . GERD (gastroesophageal reflux disease)   . History of skin cancer    tafeen/ non melanoma.   Marland Kitchen Hx of varicella   . Hypertension   . Hypothyroidism   . Monoclonal gammopathies   . Pacemaker   . Palpitations    PVCs/bigeminy on event in May 2009 revealing this was relatively asymptomatic  . Right lower quadrant abdominal pain 07/19/2012   Recurrent  With nausea    This is new since she had ct for llq pain in [facility]    R/o appendiceal problem  Hernia  Get ct scan  And plan  Fu     . Thrombocytopenia ([place])     Past Surgical History:  Procedure Laterality Date  . CARDIOVERSION N/A 02/26/2014   Procedure: CARDIOVERSION AT BEDSIDE;  Surgeon: Pixie Casino, MD;  Location: [facility];  Service: Cardiovascular;  Laterality: N/A;  . CARDIOVERSION N/A 04/22/2014   Procedure: CARDIOVERSION (BEDSIDE);  Surgeon: [name], MD;  Location: [facility] OR;  Service: Cardiovascular;  Laterality: N/A;  . CATARACT EXTRACTION     Bilateral  implantt  . CESAREAN SECTION     times 2  . CHOLECYSTECTOMY  1999  . ESOPHAGOGASTRODUODENOSCOPY N/A 07/06/2014   Procedure: ESOPHAGOGASTRODUODENOSCOPY (EGD);  Surgeon: Ladene Artist, MD;  Location: [facility] ENDOSCOPY;  Service: Endoscopy;  Laterality: N/A;  . INSERT / REPLACE / REMOVE PACEMAKER    . LEFT HEART CATHETERIZATION WITH CORONARY ANGIOGRAM N/A 01/29/2014   Procedure: LEFT HEART CATHETERIZATION WITH CORONARY ANGIOGRAM;  Surgeon: Blane Ohara, MD;  Location: [facility] CATH LAB;  Service: Cardiovascular;  Laterality: N/A;  . PACEMAKER INSERTION  04/23/2014   STJ Assurity dual chamber pacemaker implanted by Dr  Rayann Heman  . PERMANENT PACEMAKER INSERTION N/A 04/23/2014   Procedure: PERMANENT PACEMAKER INSERTION;  Surgeon: Evans Lance, MD;  [facility] C[facility];  Service: Cardiovascular;  Laterality: N/A;  . RIGHT HEART CATHETERIZATION N/A 09/22/2014   Procedure: RIGHT HEART CATH;  Surgeon: Jolaine Artist, MD;  Location: [facility] CATH LAB;  Service: Cardiovascular;  Laterality: N/A;  . SHOULDER SURGERY  1996   Right     Social History: Patient is independent with all ADLs.  Patient walks unassisted.  She is not a smoker.  She does not use alcohol.  She has a daughter who checks on her regularly.  Allergies  Allergen Reactions  . Other Shortness Of Breath    Detergents, perfumes and other strong odor emitting compounds  . Tramadol Shortness Of Breath and Nausea Only  . Ibuprofen Other (See Comments)    Told not to take med    Family history: family history includes Alzheimer's disease in her mother; Cancer (age of onset: 39) in her brother; Coronary artery disease in her father; Heart attack in her father; Heart disease in her father.  Prior to Admission medications   Medication Sig Start Date End Date Taking? Authorizing Provider  acetaminophen (TYLENOL) 325 MG tablet Take 2 tablets (650 mg total) by mouth every 4 (four) hours as needed for headache or mild pain. 09/26/14  Yes Isaiah Serge, NP  amiodarone (PACERONE) 200 MG tablet TAKE ONE TABLET BY MOUTH ONCE DAILY 05/23/16  Yes Evans Lance, MD  denosumab (PROLIA) 60 MG/ML SOLN injection Inject 60 mg into the skin every 6 (six) months. Administer in upper arm, thigh, or abdomen   Yes Historical Provider, MD  ELIQUIS 2.5 MG TABS tablet TAKE ONE TABLET BY MOUTH TWICE DAILY 03/14/16  Yes Jolaine Artist, MD  Ferrous Sulfate (IRON) 325 (65 Fe) MG TABS Take 1 tablet by mouth 2 (two) times daily with a meal. 04/08/16  Yes Isaiah Serge, NP  levothyroxine (SYNTHROID, LEVOTHROID) 75 MCG tablet TAKE ONE TABLET BY MOUTH ONCE DAILY 02/29/16  Yes Burnis Medin, MD  loratadine (CLARITIN) 10 MG tablet Take 10 mg by mouth at bedtime as needed for allergies or rhinitis.    Yes Historical Provider, MD  meclizine (ANTIVERT) 12.5 MG tablet Take 1 tablet (12.5 mg total) by mouth 2 (two) times daily as needed for dizziness. 03/09/16  Yes Jolaine Artist, MD  metolazone (ZAROXOLYN) 2.5 MG tablet Take 1 tablet (2.5 mg total) by mouth as needed (for weight 134 lb or greater). 03/09/16  Yes Jolaine Artist, MD  nitroGLYCERIN (NITROSTAT) 0.4 MG SL tablet Place 1 tablet (0.4 mg total) under the tongue every 5 (five) minutes as needed. For chest pain 05/06/16  Yes Jolaine Artist, MD  oxyCODONE-acetaminophen (  PERCOCET/ROXICET) 5-325 MG tablet Take 1 tablet by mouth daily as needed for pain. 06/09/16  Yes Historical Provider, MD  pantoprazole (PROTONIX) 40 MG tablet Take 1 tablet (40 mg total) by mouth 2 (two) times daily. For 1 week then go back to 1 daily. Patient taking differently: Take 40 mg by mouth daily.  04/19/16  Yes Burnis Medin, MD  potassium chloride SA (K-DUR,KLOR-CON) 20 MEQ tablet Take 3 tablets (60 mEq total) by mouth daily. Take an additional 48meq with Metolazone. 04/03/16  Yes Isaiah Serge, NP  simvastatin (ZOCOR) 10 MG tablet TAKE ONE TABLET BY MOUTH ONCE DAILY AT BEDTIME 05/31/16  Yes Jolaine Artist, MD  torsemide (DEMADEX) 20 MG tablet Take 2 tablets (40 mg total) by mouth daily. 04/03/16  Yes Isaiah Serge, NP       Physical Exam: BP (!) 113/51   Pulse 70   Temp 98.2 F (36.8 C) (Oral)   Resp 19   SpO2 98%  General appearance: Well-developed, adult female, alert and in no acute distress.   Eyes: Anicteric, conjunctiva pink, lids and lashes normal.     ENT: No nasal deformity, discharge, or epistaxis.  OP moist without lesions.   Skin: Warm and dry.   Cardiac: RRR, nl S1-S2, no murmurs appreciated.  Capillary refill is brisk.  JVP not visible.  No LE edema.  Radial and DP pulses 2+ and symmetric.  No carotid  bruits. Respiratory: Normal respiratory rate and rhythm.  CTAB without rales or wheezes. GI: Abdomen soft without rigidity.  Mild TTP. No ascites, distension.   MSK: No deformities or effusions.   Pain not reproduced with palpation of precordium.  No pain with arm movement. Neuro: Sensorium intact and responding to questions, attention normal.  Speech is fluent.  Moves all extremities equally and with normal coordination.    Psych: Behavior appropriate.  Affect normal.  No evidence of aural or visual hallucinations or delusions.       Labs on Admission:  The metabolic panel shows chronic stable renal insufficiency. The complete blood count shows mild thrombocytopenia. The initial troponin is negative. Lipase and LFTs normal.  Radiological Exams on Admission: Personally reviewed: Dg Chest 2 View  Result Date: 06/30/2016 CLINICAL DATA:  Chest pain today EXAM: CHEST  2 VIEW COMPARISON:  03/31/2016 FINDINGS: Cardiac pacemaker. Shallow inspiration with elevation of left hemidiaphragm. Heart size and pulmonary vascularity are normal. There is a fine interstitial pattern to the lungs some with the previous study, likely representing chronic fibrosis although persistent interstitial edema is not excluded. Suggestion of superimposed infiltration in the left lung base possibly representing acute pneumonia. Peribronchial thickening suggesting airways disease. No blunting of costophrenic angles. No pneumothorax. Degenerative changes in spine. Calcification of the aorta. IMPRESSION: Chronic interstitial process, likely fibrosis but can't exclude persistent edema. Superimposed infiltration in the left lung base may represent acute pneumonia. Electronically Signed   By: Lucienne Capers M.D.   On: 06/30/2016 21:31    EKG: Independently reviewed. Paced rhythm, rate 70.    Assessment/Plan 1. Chest pain: This is atypical in character and seems to be recurrent in nature, including in June 2016 when she had  elevated troponin but flat and then March of this year around when she had had a normal catheterization.  She is followed by Dr. Haroldine Laws in the heart failure clinic.    I have extremely low clinical suspicion for dissection, and do not think this warrants further testing.  I also have low clinical  suspicion for PE.     -Serial troponins are ordered -Telemetry -Consult to Cardiology, appreciate recommendations    2. Afib:  CHADs2Vasc 5.  Pacer in place. -Continue amiodarone -Continue apixaban, renally dosed  3. Hypothyroidism:  -Continue levothyroxine  4. CHF:  EF 123456, grade 2 diastolic dysfunction.  Appears euvolemic. -Continue Demadex and K -Continue statin  5. Anemia:  Stable, chronic, normocytic. -Continue iron  6. GERD:  Stable.  -Continue PPI     DVT prophylaxis: None needed, on apixaban Diet: NPO until cleared by Cardiology Code Status: Full  Family Communication: None present  Disposition Plan: Anticipate overnight observation for arrhythmia on telemetry, serial troponins and subsequent risk stratification by Cardiology.    Admission status: Telemetry, OBS   Medical decision making: Patient seen at 8:20 PM on 06/30/2016.  The patient was discussed with Dr. Winfred Leeds. What exists of the patient's chart was reviewed in depth.  Clinical condition: stable.      Edwin Dada [facility] Pager [phone]

## 2016-06-30 NOTE — ED Provider Notes (Signed)
[facility] DEPT Provider Note   CSN: [id number] Arrival date & time: 06/30/16  1729     History   Chief Complaint Chief Complaint  Patient presents with  . Chest Pain    HPI [name] is a 82 y.o. female.HPI   complains of anterior chest pain radiating from right chest and moves to left chest and epigastrium is intermittent onset 3 hours ago. Pain lasts 5 minutes at a time. Nothing makes symptoms better or worse. She was treated with subungual nitroglycerin, without relief. Last bowel movement earlier today, yesterday, normal. Associated symptoms include mild nausea no vomiting no shortness of breath no other associated symptoms discomfort is mild at present. Brought by EMS  Past Medical History:  Diagnosis Date  . Anemia   . Atrial fibrillation ([place])    a. Dx 01/2014->Eliquis started.  . Cancer ([place])   . Chest pain    a. 2011 Neg MV;  b. 01/2014 Cath: LM 20-30, LAD nl, D1 nl, LCX nl, OM1 nl, RCA dom 30-45m, PD/PL nl, EF 65%->Med Rx; c. 03/2016 MV: no ischemia/infarct. EF 63%.  . Degenerative joint disease   . Diastolic CHF, acute on chronic ([place]) 01/31/2012  . Dyslipidemia   . Dysrhythmia   . Family history of adverse reaction to anesthesia    " multiple family members have difficulty waking "  . GERD (gastroesophageal reflux disease)   . History of skin cancer    tafeen/ non melanoma.   Marland Kitchen Hx of varicella   . Hypertension   . Hypothyroidism   . Monoclonal gammopathies   . Pacemaker   . Palpitations    PVCs/bigeminy on event in May 2009 revealing this was relatively asymptomatic  . Right lower quadrant abdominal pain 07/19/2012   Recurrent  With nausea    This is new since she had ct for llq pain in [facility]    R/o appendiceal problem  Hernia  Get ct scan  And plan  Fu     . Thrombocytopenia [facility])     Patient Active Problem List   Diagnosis Date Noted  . Hypertensive heart disease 04/01/2016  . PAF (paroxysmal atrial fibrillation) ([place]) 03/31/2016  . Chest pain  at rest 03/31/2016  . Chest pain with moderate risk for cardiac etiology, negative MI and Nuc study, + muscular skeletal   . Chronic diastolic CHF (congestive heart failure) ([place]) 01/28/2016  . Atypical chest pain 01/21/2016  . Precordial chest pain 09/22/2015  . CHF exacerbation ([place]) 07/30/2015  . Pain in the chest   . Hypertension   . CKD (chronic kidney disease) stage 4, GFR 15-29 ml/min ([facility]) 05/09/2015  . Anemia of chronic disease 05/09/2015  . Dyslipidemia 05/09/2015  . Hypokalemia 05/09/2015  . Elevated troponin I level   . Iron deficiency anemia 11/21/2014  . Chest pain 06/27/2014  . Pacemaker 04/27/2014  . Chronic anticoagulation 02/25/2014  . Chronic combined systolic and diastolic CHF (congestive heart failure) ([place]) 02/24/2014  . Atrial fibrillation ([place]) 01/29/2014  . MGUS (monoclonal gammopathy of unknown significance) 08/01/2013  . Hypothyroidism 02/19/2012  . Hyperlipidemia 12/15/2008  . HYPERTENSION, BENIGN 12/15/2008    Past Surgical History:  Procedure Laterality Date  . CARDIOVERSION N/A 02/26/2014   Procedure: CARDIOVERSION AT BEDSIDE;  Surgeon: Pixie Casino, MD;  Location: [facility];  Service: Cardiovascular;  Laterality: N/A;  . CARDIOVERSION N/A 04/22/2014   Procedure: CARDIOVERSION (BEDSIDE);  Surgeon: [name], MD;  Location: [facility];  Service: Cardiovascular;  Laterality: N/A;  . CATARACT  EXTRACTION     Bilateral  implantt  . CESAREAN SECTION     times 2  . CHOLECYSTECTOMY  1999  . ESOPHAGOGASTRODUODENOSCOPY N/A 07/06/2014   Procedure: ESOPHAGOGASTRODUODENOSCOPY (EGD);  Surgeon: Ladene Artist, MD;  Location: [facility] ENDOSCOPY;  Service: Endoscopy;  Laterality: N/A;  . INSERT / REPLACE / REMOVE PACEMAKER    . LEFT HEART CATHETERIZATION WITH CORONARY ANGIOGRAM N/A 01/29/2014   Procedure: LEFT HEART CATHETERIZATION WITH CORONARY ANGIOGRAM;  Surgeon: Blane Ohara, MD;  Location: [facility] CATH LAB;  Service: Cardiovascular;  Laterality: N/A;  . PACEMAKER  INSERTION  04/23/2014   STJ Assurity dual chamber pacemaker implanted by Dr Rayann Heman  . PERMANENT PACEMAKER INSERTION N/A 04/23/2014   Procedure: PERMANENT PACEMAKER INSERTION;  Surgeon: Evans Lance, MD;  Location: [facility] CATH LAB;  Service: Cardiovascular;  Laterality: N/A;  . RIGHT HEART CATHETERIZATION N/A 09/22/2014   Procedure: RIGHT HEART CATH;  Surgeon: Jolaine Artist, MD;  Location: [facility] CATH LAB;  Service: Cardiovascular;  Laterality: N/A;  . SHOULDER SURGERY  1996   Right     OB History    No data available       Home Medications    Prior to Admission medications   Medication Sig Start Date End Date Taking? Authorizing Provider  acetaminophen (TYLENOL) 325 MG tablet Take 2 tablets (650 mg total) by mouth every 4 (four) hours as needed for headache or mild pain. 09/26/14   Isaiah Serge, NP  amiodarone (PACERONE) 200 MG tablet TAKE ONE TABLET BY MOUTH ONCE DAILY 05/23/16   Evans Lance, MD  denosumab (PROLIA) 60 MG/ML SOLN injection Inject 60 mg into the skin every 6 (six) months. Administer in upper arm, thigh, or abdomen    Historical Provider, MD  ELIQUIS 2.5 MG TABS tablet TAKE ONE TABLET BY MOUTH TWICE DAILY 03/14/16   Jolaine Artist, MD  Ferrous Sulfate (IRON) 325 (65 Fe) MG TABS Take 1 tablet by mouth 2 (two) times daily with a meal. 04/08/16   Isaiah Serge, NP  levothyroxine (SYNTHROID, LEVOTHROID) 75 MCG tablet TAKE ONE TABLET BY MOUTH ONCE DAILY 02/29/16   Burnis Medin, MD  loratadine (CLARITIN) 10 MG tablet Take 10 mg by mouth at bedtime as needed for allergies or rhinitis.     Historical Provider, MD  meclizine (ANTIVERT) 12.5 MG tablet Take 1 tablet (12.5 mg total) by mouth 2 (two) times daily as needed for dizziness. 03/09/16   Jolaine Artist, MD  metolazone (ZAROXOLYN) 2.5 MG tablet Take 1 tablet (2.5 mg total) by mouth as needed (for weight 134 lb or greater). 03/09/16   Jolaine Artist, MD  nitroGLYCERIN (NITROSTAT) 0.4 MG SL tablet Place 1 tablet (0.4 mg  total) under the tongue every 5 (five) minutes as needed. For chest pain 05/06/16   Jolaine Artist, MD  pantoprazole (PROTONIX) 40 MG tablet Take 1 tablet (40 mg total) by mouth 2 (two) times daily. For 1 week then go back to 1 daily. Patient taking differently: Take 40 mg by mouth daily.  04/19/16   Burnis Medin, MD  potassium chloride SA (K-DUR,KLOR-CON) 20 MEQ tablet Take 3 tablets (60 mEq total) by mouth daily. Take an additional 17meq with Metolazone. 04/03/16   Isaiah Serge, NP  simvastatin (ZOCOR) 10 MG tablet TAKE ONE TABLET BY MOUTH ONCE DAILY AT BEDTIME 05/31/16   Jolaine Artist, MD  torsemide (DEMADEX) 20 MG tablet Take 2 tablets (40 mg total) by mouth daily. 04/03/16  Isaiah Serge, NP    Family History Family History  Problem Relation Age of Onset  . Coronary artery disease Father   . Heart disease Father   . Heart attack Father   . Alzheimer's disease Mother   . Cancer Brother 23    lung cancer  . Lung cancer      Social History Social History  Substance Use Topics  . Smoking status: Never Smoker  . Smokeless tobacco: Never Used  . Alcohol use No     Allergies   Other; Tramadol; and Ibuprofen   Review of Systems Review of Systems  Constitutional: Negative.   HENT: Negative.   Respiratory: Negative.   Cardiovascular: Positive for chest pain.  Gastrointestinal: Positive for abdominal pain and nausea.  Musculoskeletal: Negative.   Skin: Negative.   Neurological: Negative.   Psychiatric/Behavioral: Negative.   All other systems reviewed and are negative.    Physical Exam Updated Vital Signs BP 118/60   Pulse 70   Temp 98.2 F (36.8 C) (Oral)   Resp 17   SpO2 96%   Physical Exam  Constitutional: She appears well-developed and well-nourished. No distress.  HENT:  Head: Normocephalic and atraumatic.  Eyes: Conjunctivae are normal. Pupils are equal, round, and reactive to light.  Neck: Neck supple. No tracheal deviation present. No  thyromegaly present.  Cardiovascular: Normal rate and regular rhythm.   No murmur heard. Pulmonary/Chest: Effort normal and breath sounds normal.  Abdominal: Soft. Bowel sounds are normal. She exhibits no distension. There is no tenderness.  Musculoskeletal: Normal range of motion. She exhibits no edema or tenderness.  Neurological: She is alert. Coordination normal.  Skin: Skin is warm and dry. No rash noted.  Psychiatric: She has a normal mood and affect.  Nursing note and vitals reviewed.    ED Treatments / Results  Labs (all labs ordered are listed, but only abnormal results are displayed) Labs Reviewed  COMPREHENSIVE METABOLIC PANEL  CBC WITH DIFFERENTIAL/PLATELET  LIPASE, BLOOD  I-STAT TROPOININ, ED    EKG  EKG Interpretation  Date/Time:  Thursday June 30 2016 17:37:59 EDT Ventricular Rate:  70 PR Interval:    QRS Duration: 154 QT Interval:  475 QTC Calculation: 513 R Axis:   80 Text Interpretation:  Sinus rhythm Left bundle branch block Confirmed by Winfred Leeds  MD, Halyn Flaugher [phone]) on 06/30/2016 5:44:45 PM      Results for orders placed or performed during the hospital encounter of 06/30/16  Comprehensive metabolic panel  Result Value Ref Range   Sodium 138 135 - 145 mmol/L   Potassium 4.4 3.5 - 5.1 mmol/L   Chloride 110 101 - 111 mmol/L   CO2 23 22 - 32 mmol/L   Glucose, Bld 114 (H) 65 - 99 mg/dL   BUN 32 (H) 6 - 20 mg/dL   Creatinine, Ser 2.05 (H) 0.44 - 1.00 mg/dL   Calcium 8.6 (L) 8.9 - 10.3 mg/dL   Total Protein 6.2 (L) 6.5 - 8.1 g/dL   Albumin 3.4 (L) 3.5 - 5.0 g/dL   AST 16 15 - 41 U/L   ALT 13 (L) 14 - 54 U/L   Alkaline Phosphatase 71 38 - 126 U/L   Total Bilirubin 0.7 0.3 - 1.2 mg/dL   GFR calc non Af Amer 20 (L) >60 mL/min   GFR calc Af Amer 24 (L) >60 mL/min   Anion gap 5 5 - 15  CBC with Differential/Platelet  Result Value Ref Range   WBC 5.2 4.0 - 10.5  K/uL   RBC 3.95 3.87 - 5.11 MIL/uL   Hemoglobin 12.2 12.0 - 15.0 g/dL   HCT 38.8 36.0 -  46.0 %   MCV 98.2 78.0 - 100.0 fL   MCH 30.9 26.0 - 34.0 pg   MCHC 31.4 30.0 - 36.0 g/dL   RDW 14.2 11.5 - 15.5 %   Platelets 122 (L) 150 - 400 K/uL   Neutrophils Relative % 75 %   Neutro Abs 3.9 1.7 - 7.7 K/uL   Lymphocytes Relative 11 %   Lymphs Abs 0.6 (L) 0.7 - 4.0 K/uL   Monocytes Relative 11 %   Monocytes Absolute 0.6 0.1 - 1.0 K/uL   Eosinophils Relative 2 %   Eosinophils Absolute 0.1 0.0 - 0.7 K/uL   Basophils Relative 1 %   Basophils Absolute 0.0 0.0 - 0.1 K/uL  Lipase, blood  Result Value Ref Range   Lipase 32 11 - 51 U/L  I-stat troponin, ED  Result Value Ref Range   Troponin i, poc 0.04 0.00 - 0.08 ng/mL   Comment 3           No results found. Radiology No results found.  Procedures Procedures (including critical care time)  Medications Ordered in ED Medications - No data to display 7:20 PM patient resting comfortably. Asymptomatic. Without treatment.  Initial Impression / Assessment and Plan / ED Course  I have reviewed the triage vital signs and the nursing notes.  Pertinent labs & imaging results that were available during my care of the patient were reviewed by me and considered in my medical decision making (see chart for details).  Clinical Course  8:10 PM remains asymptomatic. Symptoms are atypical for acute coronary syndrome. Clinical suspicion is low for acute aortic dissection. I did discuss case with Dr.Cain, Materials engineer. If further workup for aortic dissection needed he suggests abdominal ultrasound and TEE. Dr. Loleta Books consulted and will evaluate patient in ED. Renal insufficiency is chronic Final Clinical Impressions(s) / ED Diagnoses   Final diagnoses:  None   Diagnosis #1chest pain at rest  #2 right renal insufficiency  New Prescriptions New Prescriptions   No medications on file     Orlie Dakin, MD 06/30/16 2019

## 2016-06-30 NOTE — ED Notes (Signed)
Admitting at bedside 

## 2016-06-30 NOTE — ED Notes (Signed)
Pt returned from xray

## 2016-07-01 DIAGNOSIS — I5042 Chronic combined systolic (congestive) and diastolic (congestive) heart failure: Secondary | ICD-10-CM | POA: Diagnosis not present

## 2016-07-01 DIAGNOSIS — I10 Essential (primary) hypertension: Secondary | ICD-10-CM

## 2016-07-01 DIAGNOSIS — R0789 Other chest pain: Secondary | ICD-10-CM | POA: Diagnosis not present

## 2016-07-01 DIAGNOSIS — N184 Chronic kidney disease, stage 4 (severe): Secondary | ICD-10-CM

## 2016-07-01 DIAGNOSIS — E039 Hypothyroidism, unspecified: Secondary | ICD-10-CM

## 2016-07-01 DIAGNOSIS — R1013 Epigastric pain: Secondary | ICD-10-CM

## 2016-07-01 LAB — TROPONIN I
TROPONIN I: 0.03 ng/mL — AB (ref <0.03)
TROPONIN I: 0.04 ng/mL — AB (ref <0.03)
TROPONIN I: 0.03 ng/mL — AB (ref <0.03)

## 2016-07-01 MED ORDER — IRON 325 (65 FE) MG PO TABS: 1.0000 | ORAL_TABLET | Freq: Every day | ORAL | 0 refills | Status: AC

## 2016-07-01 MED ORDER — PANTOPRAZOLE SODIUM 40 MG PO TBEC: 40.0000 mg | DELAYED_RELEASE_TABLET | Freq: Two times a day (BID) | ORAL | 0 refills | Status: DC

## 2016-07-01 NOTE — Care Management Obs Status (Signed)
[facility] NOTIFICATION   Patient Details  Name: [name] MRN: [id number] Date of Birth: [birth year]   Medicare Observation Status Notification Given:  Yes (Patient does not want to sign the letter at this time; Letter explained to pt iin detail, all questions answered)    Royston Bake, RN 07/01/2016, 4:00 PM

## 2016-07-01 NOTE — Discharge Summary (Addendum)
Physician Discharge Summary  ILLIANNA Dickson [id number] DOB: 31-Mar-1925 DOA: 06/30/2016  PCP: [name], MD  Admit date: 06/30/2016 Discharge date: 07/01/2016  Admitted From: Home Disposition: Home  Recommendations for Outpatient Follow-up:  1. Follow up with PCP in 1 weeks 2. Follow up with cardiologist in 1-2 weeks  Discharge Condition: FULL CODE STATUS: FULL Diet recommendation: Heart Healthy    Brief/Interim Summary: 82 yo female with PMH of Nonobstructive CAD by catheterization on 3/15, proximal A. Fib (on amiodarone 200 daily, and Eliquis 2.5 mg twice a day), s/p St. Jude PPM, diastolic CHF, hypertension, hyperlipidemia, anemia, and CKD who presented to the [facility] ED with reports of centralized chest burning/discomfort on 8/17.   Assessment & Plan:   1. Chest pain: This is atypical in character and seems to be recurrent in nature, including in June 2016 when she had elevated troponin but flat and then March of this year around when she had had a normal catheterization. She is followed by Dr. Haroldine Laws in the heart failure clinic.   I have extremely low clinical suspicion for dissection, and do not think this warrants further testing. I also have low clinical suspicion for PE.  -Serial troponins are ordered and have been flat.  -Telemetry monitoring, no acute findings.  --Abnormal CXR: do not feel patient has pneumonia with normal temp, normal WBC, no cough or other symptoms to suggest pneumonia.  She has chronic interstitial lung disease and fibrosis with superimposed chronic CHF.  She says that she feels back to her baseline now.  Close outpatient follow up arranged for reassessment.    -Consult to Cardiology, appreciate recommendations - they felt symptoms most likely GI but recommended close outpatient follow up with her cardiologist. OK to discharge home.    2. Afib: CHADs2Vasc 5. Pacer in place. -Continue amiodarone -Continue apixaban, renally  dosed  3. Hypothyroidism: -Continue levothyroxine  4. CHF: EF [id number], grade 2 diastolic dysfunction. Appears euvolemic. -Continue Demadex and K -Continue statin  5. Anemia: Stable, chronic, normocytic. -Continue iron but reduced dose to 1 tab per day  6. GERD:  -Continue PPI, increased dose to BID at discharge  DVT prophylaxis: apixaban Diet:NPO until cleared by Cardiology Code Status:Full Family Communication:None present Disposition Plan:Anticipate overnight observation for arrhythmia on telemetry, serial troponins and subsequent risk stratification by Cardiology.   Consultants:  cardiology  Procedures:  Pt refusing stress testing  Discharge Diagnoses:  Principal Problem:   Atypical chest pain Active Problems:   HYPERTENSION, BENIGN   Hypothyroidism   Chronic combined systolic and diastolic CHF (congestive heart failure) (HCC)   Pacemaker   CKD (chronic kidney disease) stage 4, GFR 15-29 ml/min (HCC)   PAF (paroxysmal atrial fibrillation) [facility])   Discharge Instructions  Discharge Instructions    Increase activity slowly    Complete by:  As directed       Medication List    TAKE these medications   acetaminophen 325 MG tablet Commonly known as:  TYLENOL Take 2 tablets (650 mg total) by mouth every 4 (four) hours as needed for headache or mild pain.   amiodarone 200 MG tablet Commonly known as:  PACERONE TAKE ONE TABLET BY MOUTH ONCE DAILY   denosumab 60 MG/ML Soln injection Commonly known as:  PROLIA Inject 60 mg into the skin every 6 (six) months. Administer in upper arm, thigh, or abdomen   ELIQUIS 2.5 MG Tabs tablet Generic drug:  apixaban TAKE ONE TABLET BY MOUTH TWICE DAILY   Iron 325 (65 Fe)  MG Tabs Take 1 tablet by mouth daily. What changed:  when to take this   levothyroxine 75 MCG tablet Commonly known as:  SYNTHROID, LEVOTHROID TAKE ONE TABLET BY MOUTH ONCE DAILY   loratadine 10 MG tablet Commonly known as:   CLARITIN Take 10 mg by mouth at bedtime as needed for allergies or rhinitis.   meclizine 12.5 MG tablet Commonly known as:  ANTIVERT Take 1 tablet (12.5 mg total) by mouth 2 (two) times daily as needed for dizziness.   metolazone 2.5 MG tablet Commonly known as:  ZAROXOLYN Take 1 tablet (2.5 mg total) by mouth as needed (for weight 134 lb or greater).   nitroGLYCERIN 0.4 MG SL tablet Commonly known as:  NITROSTAT Place 1 tablet (0.4 mg total) under the tongue every 5 (five) minutes as needed. For chest pain   oxyCODONE-acetaminophen 5-325 MG tablet Commonly known as:  PERCOCET/ROXICET Take 1 tablet by mouth daily as needed for pain.   pantoprazole 40 MG tablet Commonly known as:  PROTONIX Take 1 tablet (40 mg total) by mouth 2 (two) times daily. What changed:  additional instructions   potassium chloride SA 20 MEQ tablet Commonly known as:  K-DUR,KLOR-CON Take 3 tablets (60 mEq total) by mouth daily. Take an additional 64meq with Metolazone.   simvastatin 10 MG tablet Commonly known as:  ZOCOR TAKE ONE TABLET BY MOUTH ONCE DAILY AT BEDTIME   torsemide 20 MG tablet Commonly known as:  DEMADEX Take 2 tablets (40 mg total) by mouth daily.      Follow-up Information    [name], MD. Schedule an appointment as soon as possible for a visit in 1 week(s).   Specialties:  Internal Medicine, Pediatrics Why:  Hospital Follow Up Contact information: [place] [address] [phone]        Glori Bickers, MD. Schedule an appointment as soon as possible for a visit in 1 week(s).   Specialty:  Cardiology Why:  Hospital Follow Up Contact information: [place] [address] [phone]          Allergies  Allergen Reactions  . Other Shortness Of Breath    Detergents, perfumes and other strong odor emitting compounds  . Tramadol Shortness Of Breath and Nausea Only  . Ibuprofen Other (See Comments)     Told not to take med    Procedures/Studies: Dg Chest 2 View  Result Date: 06/30/2016 CLINICAL DATA:  Chest pain today EXAM: CHEST  2 VIEW COMPARISON:  03/31/2016 FINDINGS: Cardiac pacemaker. Shallow inspiration with elevation of left hemidiaphragm. Heart size and pulmonary vascularity are normal. There is a fine interstitial pattern to the lungs some with the previous study, likely representing chronic fibrosis although persistent interstitial edema is not excluded. Suggestion of superimposed infiltration in the left lung base possibly representing acute pneumonia. Peribronchial thickening suggesting airways disease. No blunting of costophrenic angles. No pneumothorax. Degenerative changes in spine. Calcification of the aorta. IMPRESSION: Chronic interstitial process, likely fibrosis but can't exclude persistent edema. Superimposed infiltration in the left lung base may represent acute pneumonia. Electronically Signed   By: Lucienne Capers M.D.   On: 06/30/2016 21:31   Subjective: Pt feels much better, eating well, no distress, no chest pain, no SOB.   Discharge Exam: Vitals:   07/01/16 0445 07/01/16 1400  BP: 95/60 (!) 90/45  Pulse: 78 77  Resp: 18 18  Temp: 97.9 F (36.6 C) 98.8 F (37.1 C)   Vitals:   06/30/16  2300 07/01/16 0100 07/01/16 0445 07/01/16 1400  BP: (!) 103/41 (!) 105/58 95/60 (!) 90/45  Pulse: 72 68 78 77  Resp: 18 18 18 18   Temp:  97.7 F (36.5 C) 97.9 F (36.6 C) 98.8 F (37.1 C)  TempSrc: Oral Oral Oral Oral  SpO2: 100% 95% 96% 98%  Weight: 55.7 kg (122 lb 12.8 oz)  60.2 kg (132 lb 12.8 oz)   Height: 4\' 10"  (1.473 m)      General exam: awake, alert, sitting up in chair, NAD. Respiratory system: Clear. No increased work of breathing. Cardiovascular system: S1 & S2 heard,  No JVD, murmurs, gallops, clicks or pedal edema. Gastrointestinal system: Abdomen is nondistended, soft and nontender. Normal bowel sounds heard. Central nervous system: Alert and  oriented. No focal neurological deficits. Extremities: no CCE.  The results of significant diagnostics from this hospitalization (including imaging, microbiology, ancillary and laboratory) are listed below for reference.     Microbiology: No results found for this or any previous visit (from the past 240 hour(s)).   Labs: BNP (last 3 results)  Recent Labs  03/06/16 1730 03/28/16 0730 03/31/16 2104  BNP 420.1* 289.6* [id number]   Basic Metabolic Panel:  Recent Labs Lab 06/30/16 1815  NA 138  K 4.4  CL 110  CO2 23  GLUCOSE 114*  BUN 32*  CREATININE 2.05*  CALCIUM 8.6*   Liver Function Tests:  Recent Labs Lab 06/30/16 1815  AST 16  ALT 13*  ALKPHOS 71  BILITOT 0.7  PROT 6.2*  ALBUMIN 3.4*    Recent Labs Lab 06/30/16 1815  LIPASE 32   No results for input(s): AMMONIA in the last 168 hours. CBC:  Recent Labs Lab 06/30/16 1815  WBC 5.2  NEUTROABS 3.9  HGB 12.2  HCT 38.8  MCV 98.2  PLT 122*   Cardiac Enzymes:  Recent Labs Lab 07/01/16 0120 07/01/16 0411 07/01/16 0750  TROPONINI 0.03* 0.04* 0.03*   BNP: Invalid input(s): POCBNP CBG: No results for input(s): GLUCAP in the last 168 hours. D-Dimer No results for input(s): DDIMER in the last 72 hours. Hgb A1c No results for input(s): HGBA1C in the last 72 hours. Lipid Profile No results for input(s): CHOL, HDL, LDLCALC, TRIG, CHOLHDL, LDLDIRECT in the last 72 hours. Thyroid function studies No results for input(s): TSH, T4TOTAL, T3FREE, THYROIDAB in the last 72 hours.  Invalid input(s): FREET3 Anemia work up No results for input(s): VITAMINB12, FOLATE, FERRITIN, TIBC, IRON, RETICCTPCT in the last 72 hours. Urinalysis    Component Value Date/Time   COLORURINE YELLOW 05/02/2016 1400   APPEARANCEUR CLEAR 05/02/2016 1400   LABSPEC 1.007 05/02/2016 1400   PHURINE 5.5 05/02/2016 1400   GLUCOSEU NEGATIVE 05/02/2016 1400   GLUCOSEU NEGATIVE 12/19/2011 0956   HGBUR NEGATIVE 05/02/2016 1400    BILIRUBINUR NEGATIVE 05/02/2016 1400   BILIRUBINUR - 03/28/2016 1536   KETONESUR NEGATIVE 05/02/2016 1400   PROTEINUR NEGATIVE 05/02/2016 1400   UROBILINOGEN negative 03/28/2016 1536   UROBILINOGEN 0.2 09/10/2015 1150   NITRITE NEGATIVE 05/02/2016 1400   LEUKOCYTESUR NEGATIVE 05/02/2016 1400   Sepsis Labs Invalid input(s): PROCALCITONIN,  WBC,  LACTICIDVEN Microbiology No results found for this or any previous visit (from the past 240 hour(s)).  Time coordinating discharge: 25 minutes  SIGNED:   Irwin Brakeman, MD  [facility] 07/01/2016, 5:19 PM Pager   If 7PM-7AM, please contact night-coverage [email or web address] Password [email or web address]

## 2016-07-01 NOTE — Consult Note (Signed)
Cardiology Consult    Patient ID: [name] MRN: [id number], DOB/AGE: 1925-01-10   Admit date: 06/30/2016 Date of Consult: 07/01/2016  Primary Physician: [name], MD Primary Cardiologist: Dr. Haroldine Laws Requesting Provider: Dr. Tery Sanfilippo Reason for Consultation: Chest pain  Patient Profile    82 yo female with PMH of Nonobstructive CAD by catheterization on 3/15, proximal A. Fib (on amiodarone 200 daily, and Eliquis 2.5 mg twice a day), s/p St. Jude PPM, diastolic CHF, hypertension, hyperlipidemia, anemia, and CKD who presented to the [facility] ED with reports of centralized chest burning/discomfort on 8/17.   Past Medical History   Past Medical History:  Diagnosis Date  . Anemia   . Atrial fibrillation ([place])    a. Dx 01/2014->Eliquis started.  . Cancer ([place])   . Chest pain    a. 2011 Neg MV;  b. 01/2014 Cath: LM 20-30, LAD nl, D1 nl, LCX nl, OM1 nl, RCA dom 30-86m, PD/PL nl, EF 65%->Med Rx; c. 03/2016 MV: no ischemia/infarct. EF 63%.  . Degenerative joint disease   . Diastolic CHF, acute on chronic ([place]) 01/31/2012  . Dyslipidemia   . Dysrhythmia   . Family history of adverse reaction to anesthesia    " multiple family members have difficulty waking "  . GERD (gastroesophageal reflux disease)   . History of skin cancer    tafeen/ non melanoma.   Marland Kitchen Hx of varicella   . Hypertension   . Hypothyroidism   . Monoclonal gammopathies   . Pacemaker   . Palpitations    PVCs/bigeminy on event in May 2009 revealing this was relatively asymptomatic  . Right lower quadrant abdominal pain 07/19/2012   Recurrent  With nausea    This is new since she had ct for llq pain in [place]    R/o appendiceal problem  Hernia  Get ct scan  And plan  Fu     . Thrombocytopenia ([place])     Past Surgical History:  Procedure Laterality Date  . CARDIOVERSION N/A 02/26/2014   Procedure: CARDIOVERSION AT BEDSIDE;  Surgeon: Pixie Casino, MD;  Location: [place];  Service: Cardiovascular;   Laterality: N/A;  . CARDIOVERSION N/A 04/22/2014   Procedure: CARDIOVERSION (BEDSIDE);  Surgeon: [name], MD;  Location: [facility] OR;  Service: Cardiovascular;  Laterality: N/A;  . CATARACT EXTRACTION     Bilateral  implantt  . CESAREAN SECTION     times 2  . CHOLECYSTECTOMY  1999  . ESOPHAGOGASTRODUODENOSCOPY N/A 07/06/2014   Procedure: ESOPHAGOGASTRODUODENOSCOPY (EGD);  Surgeon: Ladene Artist, MD;  Location: [facility] ENDOSCOPY;  Service: Endoscopy;  Laterality: N/A;  . INSERT / REPLACE / REMOVE PACEMAKER    . LEFT HEART CATHETERIZATION WITH CORONARY ANGIOGRAM N/A 01/29/2014   Procedure: LEFT HEART CATHETERIZATION WITH CORONARY ANGIOGRAM;  Surgeon: Blane Ohara, MD;  Location: [facility] CATH LAB;  Service: Cardiovascular;  Laterality: N/A;  . PACEMAKER INSERTION  04/23/2014   STJ Assurity dual chamber pacemaker implanted by Dr Rayann Heman  . PERMANENT PACEMAKER INSERTION N/A 04/23/2014   Procedure: PERMANENT PACEMAKER INSERTION;  Surgeon: Evans Lance, MD;  Location: [facility] CATH LAB;  Service: Cardiovascular;  Laterality: N/A;  . RIGHT HEART CATHETERIZATION N/A 09/22/2014   Procedure: RIGHT HEART CATH;  Surgeon: Jolaine Artist, MD;  Location: [facility] CATH LAB;  Service: Cardiovascular;  Laterality: N/A;  . SHOULDER SURGERY  1996   Right      Allergies  Allergies  Allergen Reactions  . Other Shortness Of Breath  Detergents, perfumes and other strong odor emitting compounds  . Tramadol Shortness Of Breath and Nausea Only  . Ibuprofen Other (See Comments)    Told not to take med    History of Present Illness    [name] is an 82 year old female with past medical history of Nonobstructive CAD by catheterization on 3/15, proximal A. Fib (on amiodarone 200 daily, and Eliquis 2.5 mg twice a day), s/p St. Jude PPM, diastolic CHF, hypertension, hyperlipidemia, anemia, and CKD. She is currently followed by Dr. Haroldine Laws in the heart failure clinic. She was initially diagnosed with PAF in 6/15 and  underwent cardioversion with restoration of normal sinus rhythm, that resulted in profound bradycardia and hypotension requiring pacemaker implantation. Pacemaker and plantation was complicated by acute blood loss anemia requiring transfusion of PRBCs. She has had multiple admissions relating to [name] heart failure throughout the past 2 years, with many alterations made to [name] diuretic.  On 01/21/16 she was admitted with atypical chest pain, and underwent stress test with normal results, and 2-D echo showed LVEF 60-65%, with grade 2 diastolic dysfunction. She was most recently seen in the office on 04/27/2016 where she presented for post ER follow-up. At that time she reported feeling well and taking all of [name] medications as directed, with no bleeding problems noted on Eliquis and stable creatinine. During the office visit [name] volume status was noted to be stable and she was continued on torsemide 40 mg daily, with prescription to take metolazone and KOC only as needed for weight gain above 134 pounds.  Reports being in [name] usual state of health up until yesterday afternoon around 4 PM when she was working on [name] checkbook at home and developed a sudden onset of centralized substernal chest burning that radiated bilaterally to [name] chest. Reports this feeling was extremely intense, but no radiation into [name] arms or neck. Reports she informed [name] daughter of [name] pain who became concerned and called EMS. States she took one sublingual nitroglycerin at home without any relief, and was given another was EMS without any significant relief. Also given 324 of aspirin. States that [name] pain eventually subsided on arrival to the ER without any significant intervention.  In the ED [name] labs showed stable electrolytes, creatinine of 2.05 which appears to be around [name] baseline, stable hemoglobin of 12.2, troponin cycled and negative, with chest x-ray showing chronic interstitial process the possibility of fibrosis, and  questionable pneumonia to the left lung base. KG shows sinus rhythm with left bundle branch block, and appears to be AV paced on review of telemetry. Currently in sinus rhythm. She was admitted to internal medicine for further workup.  Inpatient Medications    . amiodarone  200 mg Oral Daily  . apixaban  2.5 mg Oral BID  . ferrous sulfate  325 mg Oral BID WC  . levothyroxine  75 mcg Oral QAC breakfast  . pantoprazole  40 mg Oral Daily  . potassium chloride SA  60 mEq Oral Daily  . simvastatin  10 mg Oral Q breakfast  . torsemide  40 mg Oral Daily    Family History    Family History  Problem Relation Age of Onset  . Coronary artery disease Father   . Heart disease Father   . Heart attack Father   . Alzheimer's disease Mother   . Cancer Brother 59    lung cancer  . Lung cancer      Social History    Social History   Social  History  . Marital status: Widowed    Spouse name: N/A  . Number of children: 3  . Years of education: N/A   Occupational History  .  Retired    [employer], book keeping   Social History Main Topics  . Smoking status: Never Smoker  . Smokeless tobacco: Never Used  . Alcohol use No  . Drug use: No  . Sexual activity: No   Other Topics Concern  . 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 Eats with [name].      Daughter handles medications. Sees HH and PT once a week.     Review of Systems    General:  No chills, fever, night sweats or weight changes.  Cardiovascular:  See HPI Dermatological: No rash, lesions/masses Respiratory: No cough, dyspnea Urologic: No hematuria, dysuria Abdominal:   No nausea, vomiting, diarrhea, bright red blood per rectum, melena, or hematemesis Neurologic:  No visual changes,  wkns, changes in mental status. All other systems reviewed and are otherwise negative except as noted above.  Physical Exam    Blood pressure (!) 90/45, pulse 77, temperature 98.8 F (37.1 C), temperature source Oral, resp. rate 18, height 4\' 10"  (1.473 m), weight 132 lb 12.8 oz (60.2 kg), SpO2 98 %.  General: Pleasant Older well-nourished Caucasian female, NAD Psych: Normal affect. Neuro: Alert and oriented X 3. Moves all extremities spontaneously. HEENT: Normal  Neck: Supple without bruits or JVD. Lungs:  Resp regular and unlabored, CTA. Heart: RRR no s3, s4, or murmurs. Abdomen: Soft, non-tender, non-distended, BS + x 4.  Extremities: No clubbing, cyanosis or edema. DP/PT/Radials 2+ and equal bilaterally.  Labs    Troponin [facility] of Care Test)  Recent Labs  06/30/16 2120  TROPIPOC 0.03    Recent Labs  07/01/16 0120 07/01/16 0411 07/01/16 0750  TROPONINI 0.03* 0.04* 0.03*   Lab Results  Component Value Date   WBC 5.2 06/30/2016   HGB 12.2 06/30/2016   HCT 38.8 06/30/2016   MCV 98.2 06/30/2016   PLT 122 (L) 06/30/2016    Recent Labs Lab 06/30/16 1815  NA 138  K 4.4  CL 110  CO2 23  BUN 32*  CREATININE 2.05*  CALCIUM 8.6*  PROT 6.2*  BILITOT 0.7  ALKPHOS 71  ALT 13*  AST 16  GLUCOSE 114*   Lab Results  Component Value Date   CHOL 141 04/14/2014   HDL 89 04/14/2014   LDLCALC 41 04/14/2014   TRIG 56 04/14/2014   No results found for: [facility]   Radiology Studies    Dg Chest 2 View  Result Date: 06/30/2016 CLINICAL DATA:  Chest pain today EXAM: CHEST  2 VIEW COMPARISON:  03/31/2016 FINDINGS: Cardiac pacemaker. Shallow inspiration with elevation of left hemidiaphragm. Heart size and pulmonary vascularity are normal. There is a fine interstitial pattern to the lungs some with the previous study, likely representing chronic fibrosis although persistent interstitial edema is not excluded. Suggestion of superimposed infiltration in the left lung base  possibly representing acute pneumonia. Peribronchial thickening suggesting airways disease. No blunting of costophrenic angles. No pneumothorax. Degenerative changes in spine. Calcification of the aorta. IMPRESSION: Chronic  interstitial process, likely fibrosis but can't exclude persistent edema. Superimposed infiltration in the left lung base may represent acute pneumonia. Electronically Signed   By: Lucienne Capers M.D.   On: 06/30/2016 21:31    ECG & Cardiac Imaging    EKG: Sinus rhythm with left bundle branch block, telemetry shows AV paced rhythm.  Echo: 01/22/2016  Study Conclusions  - Left ventricle: The cavity size was normal. Systolic function was   normal. The estimated ejection fraction was in the range of 60%   to 65%. Wall motion was normal; there were no regional wall   motion abnormalities. Features are consistent with a pseudonormal   left ventricular filling pattern, with concomitant abnormal   relaxation and increased filling pressure (grade 2 diastolic   dysfunction). Doppler parameters are consistent with elevated   ventricular end-diastolic filling pressure. - Aortic valve: Trileaflet; mildly thickened, mildly calcified   leaflets. There was mild stenosis. - Aortic root: The aortic root was normal in size. - Mitral valve: There was severe regurgitation. - Left atrium: The atrium was severely dilated. - Right ventricle: Pacer wire or catheter noted in right ventricle.   Systolic function was normal. - Right atrium: The atrium was moderately dilated. - Tricuspid valve: There was severe regurgitation. - Pulmonic valve: There was mild regurgitation. - Pulmonary arteries: Systolic pressure was moderately increased.   PA peak pressure: 49 mm Hg (S). - Line: A venous catheter was visualized in the superior vena cava,   with its tip in the right atrium. No abnormal features noted. - Inferior vena cava: The vessel was normal in size. - Pericardium, extracardiac: There  was no pericardial effusion.  Assessment & Plan    82 yo female with PMH of Nonobstructive CAD by catheterization on 3/15, proximal A. Fib (on amiodarone 200 daily, and Eliquis 2.5 mg twice a day), s/p St. Jude PPM, diastolic CHF, hypertension, hyperlipidemia, anemia, and CKD who presented to the [facility] ED with reports of centralized chest burning/discomfort on 8/17.   1. Chest pain: Reports having a sudden onset of centralized chest burning with radiation to bilateral sides of [name] chest yesterday afternoon while working on [name] check book. Took a nitro without any relief, and proceeded to tell [name] daughter about [name] symptoms, prompting [name] to call EMS. Was given another nitro, and 324 ASA. States the burning sensation resolved on it's own after presenting to the ED. Trop cycled 0.03>>0.04>>0.03. Did report brief episode of chest discomfort last night once arriving to the unit that resolved on its own.  -- Recent myoview in may was normal, and last 2D echo in 3/17 showed normal EF with G2DD.  -- Will discuss patient with Dr. Harrington Challenger  2. PAF s/p PPM: Remains in SR on telemetry -- continue amiodarone, and Eliquis.   Barnet Pall, NP-C Pager [phone] 07/01/2016, 3:12 PM  Pt seen and examined  I agree with findings as noted by Belle familiar to me  I had seen [name] in clinic a couple years ago  Pt presented to ER yesterday with a searing sensation in epigastrum.  Not associated with activity  Lasted a few hours and eased. In May she had spell of heaviness in chest that was self limited  Work up (echo, nuclear) then was unremarkable   Today she feels fine  She was feeling good before yesterday   Blood work is negative   Exam unremarkable   I am not convinced pt's symptosm represent angina  ?  GI   I would recomm ambulating     If feels OK I Would recomm discharging home on meds as aking with close outpt follow up. Continue GI prophylaxis  Can add mylanta  Will make  sure that she has an appt.   Pt understands and agrees with plan    Dorris Carnes

## 2016-07-01 NOTE — Progress Notes (Addendum)
PROGRESS NOTE    TORILYNN SHIPPEE  [id number]  DOB: 1925/11/09  DOA: 06/30/2016 PCP: [name], MD Outpatient Specialists:   Hospital course: BETTEANN FIZER is a 82 y.o. female with a past medical history significant for Afib with pacer, chronic diastolic CHF, CKD IV, and HTN who presents with chest pain.  The patient was in her usual state of health until this afternoon at 4PM she was reading a magazine when she had onset of sharp substernal CP.  This was severe in intensity, worse than anything she has had before, moved to the right lower ribs, then moved to the left lower ribs, and resolved spontaneously after about 20 minutes.  The pain was severe enough that she told her daughter, who called EMS.  The pain returned but then subsided again once she got to the ER.   Assessment & Plan:   1. Chest pain: This is atypical in character and seems to be recurrent in nature, including in June 2016 when she had elevated troponin but flat and then March of this year around when she had had a normal catheterization.  She is followed by Dr. Haroldine Laws in the heart failure clinic.    I have extremely low clinical suspicion for dissection, and do not think this warrants further testing.  I also have low clinical suspicion for PE.     -Serial troponins are ordered and have been flat.  -Telemetry monitoring, no acute findings.  -Consult to Cardiology, appreciate recommendations - Pt wants to see cardiology before deciding about doing stress test since she had one in May.    2. Afib:  CHADs2Vasc 5.  Pacer in place. -Continue amiodarone -Continue apixaban, renally dosed  3. Hypothyroidism:  -Continue levothyroxine  4. CHF:  EF [id number], grade 2 diastolic dysfunction.  Appears euvolemic. -Continue Demadex and K -Continue statin  5. Anemia:  Stable, chronic, normocytic. -Continue iron  6. GERD:  Stable.  -Continue PPI   DVT prophylaxis:  apixaban Diet: NPO  until cleared by Cardiology Code Status: Full  Family Communication: None present  Disposition Plan: Anticipate overnight observation for arrhythmia on telemetry, serial troponins and subsequent risk stratification by Cardiology.    Consultants:  cardiology  Procedures:  Pt refusing stress testing  Subjective: Pt says that chest pain has resolved but worried it might come back, apparently it was a really frightening chest pain episode yesterday.  Pt wants to see cardiology before deciding about doing the stress test.   Objective: Vitals:   06/30/16 2200 06/30/16 2300 07/01/16 0100 07/01/16 0445  BP: 99/61 (!) 103/41 (!) 105/58 95/60  Pulse: 72 72 68 78  Resp: 16 18 18 18   Temp:   97.7 F (36.5 C) 97.9 F (36.6 C)  TempSrc:  Oral Oral Oral  SpO2: 96% 100% 95% 96%  Weight:  55.7 kg (122 lb 12.8 oz)  60.2 kg (132 lb 12.8 oz)  Height:  4\' 10"  (1.473 m)      Intake/Output Summary (Last 24 hours) at 07/01/16 1039 Last data filed at 07/01/16 0600  Gross per 24 hour  Intake                0 ml  Output                0 ml  Net                0 ml   Filed Weights   06/30/16 2300 07/01/16 0445  Weight: 55.7 kg (  122 lb 12.8 oz) 60.2 kg (132 lb 12.8 oz)    Exam:  General exam: awake, alert, sitting up in chair, NAD. Respiratory system: Clear. No increased work of breathing. Cardiovascular system: S1 & S2 heard,  No JVD, murmurs, gallops, clicks or pedal edema. Gastrointestinal system: Abdomen is nondistended, soft and nontender. Normal bowel sounds heard. Central nervous system: Alert and oriented. No focal neurological deficits. Extremities: no CCE.  Data Reviewed: Basic Metabolic Panel:  Recent Labs Lab 06/30/16 1815  NA 138  K 4.4  CL 110  CO2 23  GLUCOSE 114*  BUN 32*  CREATININE 2.05*  CALCIUM 8.6*   Liver Function Tests:  Recent Labs Lab 06/30/16 1815  AST 16  ALT 13*  ALKPHOS 71  BILITOT 0.7  PROT 6.2*  ALBUMIN 3.4*    Recent Labs Lab  06/30/16 1815  LIPASE 32   No results for input(s): AMMONIA in the last 168 hours. CBC:  Recent Labs Lab 06/30/16 1815  WBC 5.2  NEUTROABS 3.9  HGB 12.2  HCT 38.8  MCV 98.2  PLT 122*   Cardiac Enzymes:  Recent Labs Lab 07/01/16 0120 07/01/16 0411 07/01/16 0750  TROPONINI 0.03* 0.04* 0.03*   CBG (last 3)  No results for input(s): GLUCAP in the last 72 hours. No results found for this or any previous visit (from the past 240 hour(s)).   Studies: Dg Chest 2 View  Result Date: 06/30/2016 CLINICAL DATA:  Chest pain today EXAM: CHEST  2 VIEW COMPARISON:  03/31/2016 FINDINGS: Cardiac pacemaker. Shallow inspiration with elevation of left hemidiaphragm. Heart size and pulmonary vascularity are normal. There is a fine interstitial pattern to the lungs some with the previous study, likely representing chronic fibrosis although persistent interstitial edema is not excluded. Suggestion of superimposed infiltration in the left lung base possibly representing acute pneumonia. Peribronchial thickening suggesting airways disease. No blunting of costophrenic angles. No pneumothorax. Degenerative changes in spine. Calcification of the aorta. IMPRESSION: Chronic interstitial process, likely fibrosis but can't exclude persistent edema. Superimposed infiltration in the left lung base may represent acute pneumonia. Electronically Signed   By: Lucienne Capers M.D.   On: 06/30/2016 21:31     Scheduled Meds: . amiodarone  200 mg Oral Daily  . apixaban  2.5 mg Oral BID  . ferrous sulfate  325 mg Oral BID WC  . levothyroxine  75 mcg Oral QAC breakfast  . pantoprazole  40 mg Oral Daily  . potassium chloride SA  60 mEq Oral Daily  . simvastatin  10 mg Oral Q breakfast  . torsemide  40 mg Oral Daily   Continuous Infusions:   Principal Problem:   Atypical chest pain Active Problems:   HYPERTENSION, BENIGN   Hypothyroidism   Chronic combined systolic and diastolic CHF (congestive heart failure)  (HCC)   Pacemaker   CKD (chronic kidney disease) stage 4, GFR 15-29 ml/min (HCC)   PAF (paroxysmal atrial fibrillation) ([place])   Time spent:   Irwin Brakeman, MD, FAAFP [facility] Pager [phone]  If 7PM-7AM, please contact night-coverage [email or web address] Password [email or web address] 07/01/2016, 10:39 AM    LOS: 0 days

## 2016-07-01 NOTE — Care Management Note (Signed)
Case Management Note  Patient Details  Name: EBENEZER LAZARIN MRN: [id number] Date of Birth: 1925/05/13  Subjective/Objective:  Chest pain                  Action/Plan: Discharge Planning: AVS reviewed:  NCM spoke to pt at bedside. States she was Eliquis prior to admission. She has cane and Rollator at home. Offered choice for [facility]. Pt states she had [facility] in the past. She does do daily weight and she is on [employer] CHF telephonic nurse program. Dtr will assist with care as needed. States she is independent prior to admissions with ADL's. Faxed referral to [place].   PCP -Shanon Ace K MD  Will follow up with [facility] on 07/02/2016   Expected Discharge Date:  07/01/2016             Expected Discharge Plan:  [place]  In-House Referral:  NA  Discharge planning Services  CM Consult  Post Acute Care Choice:  Home Health Choice offered to:  Patient  DME Arranged:  N/A DME Agency:  NA  HH Arranged:  PT, RN [place] Agency:  [facility] (now [facility])  Status of Service:  Completed, signed off  If discussed at [employer] of Stay Meetings, dates discussed:    Additional Comments:  Erenest Rasher, RN 07/01/2016, 6:35 PM

## 2016-07-06 DIAGNOSIS — I480 Paroxysmal atrial fibrillation: Secondary | ICD-10-CM

## 2016-07-06 DIAGNOSIS — K219 Gastro-esophageal reflux disease without esophagitis: Secondary | ICD-10-CM | POA: Diagnosis not present

## 2016-07-06 DIAGNOSIS — I130 Hypertensive heart and chronic kidney disease with heart failure and stage 1 through stage 4 chronic kidney disease, or unspecified chronic kidney disease: Secondary | ICD-10-CM | POA: Diagnosis not present

## 2016-07-06 DIAGNOSIS — N184 Chronic kidney disease, stage 4 (severe): Secondary | ICD-10-CM

## 2016-07-06 DIAGNOSIS — I2510 Atherosclerotic heart disease of native coronary artery without angina pectoris: Secondary | ICD-10-CM | POA: Diagnosis not present

## 2016-07-06 DIAGNOSIS — I5042 Chronic combined systolic (congestive) and diastolic (congestive) heart failure: Secondary | ICD-10-CM | POA: Diagnosis not present

## 2016-07-11 ENCOUNTER — Telehealth: Payer: Self-pay | Admitting: Internal Medicine

## 2016-07-11 NOTE — Telephone Encounter (Signed)
Pt needs hospital follow up appt next week.  Pt instructed to follow up in a week and that was 8/21.  Please advise on where to schedule. Pt prefers to see Dr Regis Bill.  Need to call pt to schedule appt. Jackelyn Poling, [facility] is her home health nurse.

## 2016-07-11 NOTE — Telephone Encounter (Signed)
Please use 11:15 and 11:30 slots on 07/19/16

## 2016-07-11 NOTE — Telephone Encounter (Signed)
Left message on personalized voice mail of appointment time and date. Advised pt to call back and confirm.

## 2016-07-12 ENCOUNTER — Telehealth: Payer: Self-pay | Admitting: Internal Medicine

## 2016-07-12 NOTE — Telephone Encounter (Signed)
Pt states she will be her on the appointment time and date as scheduled.

## 2016-07-12 NOTE — Telephone Encounter (Signed)
° °  Danielle a PT with [facility] call to ask for verbal orders  2 times a week for 6 weeks    [phone]

## 2016-07-12 NOTE — Telephone Encounter (Signed)
Pt coming in on 07/19/16 for post hospital follow up

## 2016-07-18 NOTE — Progress Notes (Addendum)
Pre visit review using our clinic review tool, if applicable. No additional management support is needed unless otherwise documented below in the visit note.  Chief Complaint  Patient presents with  . Hospitalization Follow-up    HPI: [name] 82 y.o.  Sent in  Post ed and hops fu   For her recurrent  Chest  abd pain and  Distresses   She has  Sig card disease and chf clinic follows   She stills lives alone.....  Has had 5 ed visit in 6 months and  3 hopitalizations  But has  Helper Lucy 3 days per week for afternoons for Hw etc  No falling   Hosp aug 17 for chest pain  bubbling and burning in chest  Cards involved and not felt to have sig  ccv event  But was on Protonix  And inc to bid  And told to do this as OP.  needs refill as is out  For 2 days no recurrence now. No vomiting and weightr stable   Asks for refill eryth ointment   Eye lid gets red at times and ointment we gave helps it when flares   No pain no vision changes.  Asks about   Taking antibiotic per dr Gaynelle Arabian  Uncertain if  Can take with heart meds  ROS: See pertinent positives and negatives per HPI. No bleeding  Falling  Edema inc  No inc sob has chronic back issues and  Past Medical History:  Diagnosis Date  . Anemia   . Atrial fibrillation ([place])    a. Dx 01/2014->Eliquis started.  . Cancer ([place])   . Chest pain    a. 2011 Neg MV;  b. 01/2014 Cath: LM 20-30, LAD nl, D1 nl, LCX nl, OM1 nl, RCA dom 30-72m, PD/PL nl, EF 65%->Med Rx; c. 03/2016 MV: no ischemia/infarct. EF 63%.  . Degenerative joint disease   . Diastolic CHF, acute on chronic ([place]) 01/31/2012  . Dyslipidemia   . Dysrhythmia   . Family history of adverse reaction to anesthesia    " multiple family members have difficulty waking "  . GERD (gastroesophageal reflux disease)   . History of skin cancer    tafeen/ non melanoma.   Marland Kitchen Hx of varicella   . Hypertension   . Hypothyroidism   . Monoclonal gammopathies   . Pacemaker   . Palpitations    PVCs/bigeminy on event in May 2009 revealing this was relatively asymptomatic  . Right lower quadrant abdominal pain 07/19/2012   Recurrent  With nausea    This is new since she had ct for llq pain in [facility]    R/o appendiceal problem  Hernia  Get ct scan  And plan  Fu     . Thrombocytopenia ([place])     Family History  Problem Relation Age of Onset  . Coronary artery disease Father   . Heart disease Father   . Heart attack Father   . Alzheimer's disease Mother   . Cancer Brother 72    lung cancer  . Lung cancer      Social History   Social History  . Marital status: Widowed    Spouse name: N/A  . Number of children: 3  . Years of education: N/A   Occupational History  .  Retired    [employer], book keeping   Social History Main Topics  . Smoking status: Never Smoker  . Smokeless tobacco: Never Used  . Alcohol use No  .  Drug use: No  . Sexual activity: No   Other Topics Concern  . 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 Eats with her.      Daughterrenee handles medications. Sees HH and PT once a week.   Helper  Lorre Nick t the Friday 1-3 pm    Outpatient Medications Prior to Visit  Medication Sig Dispense Refill  . acetaminophen (TYLENOL) 325 MG tablet Take 2 tablets (650 mg total) by mouth every 4 (four) hours as needed for headache or mild pain.    Marland Kitchen amiodarone (PACERONE) 200 MG tablet TAKE ONE TABLET BY MOUTH ONCE DAILY 30 tablet 4  . denosumab (PROLIA) 60 MG/ML SOLN injection Inject 60 mg into the skin every 6 (six) months. Administer in upper arm, thigh, or abdomen    . ELIQUIS 2.5 MG TABS tablet TAKE ONE TABLET BY MOUTH TWICE DAILY 60 tablet 6  . Ferrous Sulfate (IRON) 325 (65 Fe) MG TABS Take 1 tablet by mouth daily. 180 each 0  .  levothyroxine (SYNTHROID, LEVOTHROID) 75 MCG tablet TAKE ONE TABLET BY MOUTH ONCE DAILY 90 tablet 3  . loratadine (CLARITIN) 10 MG tablet Take 10 mg by mouth at bedtime as needed for allergies or rhinitis.     Marland Kitchen meclizine (ANTIVERT) 12.5 MG tablet Take 1 tablet (12.5 mg total) by mouth 2 (two) times daily as needed for dizziness. 10 tablet 0  . nitroGLYCERIN (NITROSTAT) 0.4 MG SL tablet Place 1 tablet (0.4 mg total) under the tongue every 5 (five) minutes as needed. For chest pain 25 tablet 6  . oxyCODONE-acetaminophen (PERCOCET/ROXICET) 5-325 MG tablet Take 1 tablet by mouth daily as needed for pain.  0  . potassium chloride SA (K-DUR,KLOR-CON) 20 MEQ tablet Take 3 tablets (60 mEq total) by mouth daily. Take an additional 10meq with Metolazone. 90 tablet 2  . simvastatin (ZOCOR) 10 MG tablet TAKE ONE TABLET BY MOUTH ONCE DAILY AT BEDTIME 30 tablet 3  . torsemide (DEMADEX) 20 MG tablet Take 2 tablets (40 mg total) by mouth daily. 60 tablet 6  . pantoprazole (PROTONIX) 40 MG tablet Take 1 tablet (40 mg total) by mouth 2 (two) times daily. 60 tablet 0  . metolazone (ZAROXOLYN) 2.5 MG tablet Take 1 tablet (2.5 mg total) by mouth as needed (for weight 134 lb or greater). (Patient not taking: Reported on 07/19/2016) 10 tablet 3   No facility-administered medications prior to visit.      EXAM:  BP (!) 146/70 (BP Location: Right Arm, Patient Position: Sitting, Cuff Size: Normal)   Pulse 70   Temp 98 F (36.7 C) (Oral)   Ht 4\' 10"  (1.473 m)   Wt 135 lb 6.4 oz (61.4 kg)   SpO2 95%   BMI 28.30 kg/m   Body mass index is 28.3 kg/m.  GENERAL: vitals reviewed and listed above, alert, oriented, appears well hydrated and in no acute distress HEENT: atraumatic, conjunctiva  Right lid mild redness clear, no obvious abnormalities on inspection of external nose and ears  NECK: no obvious masses on inspection palpation  LUNGS: clear to auscultation bilaterally, no wheezes, rales or rhonchi, CV: HRRR, no  clubbing cyanosis trc  peripheral edema nl cap refill  MS: moves all extremities without noticeable focal  Abnormality  Kyphosis  Walks  With cane  PSYCH: pleasant and cooperative, no obvious depression or anxiety cognition intact  Skin no acute rashes  Lab Results  Component Value Date   WBC 5.2 06/30/2016   HGB 12.2 06/30/2016   HCT 38.8 06/30/2016   PLT 122 (L) 06/30/2016   GLUCOSE 114 (H) 06/30/2016   CHOL 141 04/14/2014   TRIG 56 04/14/2014   HDL 89 04/14/2014   LDLCALC 41 04/14/2014   ALT 13 (L) 06/30/2016   AST 16 06/30/2016   NA 138 06/30/2016   K 4.4 06/30/2016   CL 110 06/30/2016   CREATININE 2.05 (H) 06/30/2016   BUN 32 (H) 06/30/2016   CO2 23 06/30/2016   TSH 1.472 03/31/2016   INR 1.44 07/30/2015    ASSESSMENT AND PLAN:  Discussed the following assessment and plan:  Atypical chest pain - appears gi related by beval and context benefit more than risk at this time for bid ppi see [facility] discharge follow-up  Chronic renal disease, stage III - Plan: CBC with Differential/Platelet, Basic metabolic panel  Age factor  Chronic anticoagulation - Plan: CBC with Differential/Platelet  CKD (chronic kidney disease) stage 4, GFR 15-29 ml/min (HCC)  Blepharitis, unspecified laterality - recurrent  ok to refill ery oitmemt  Thrombocytopenia ([place]) - Plan: CBC with Differential/Platelet Renal disease   Last cr 2.02  Low plt  Mild no anemia and no bleeding Appears to be back to baseline appears pretty chipper today. No acute decompensation. Plan follow-up renal function with BMP and CBC and platelets in about 3 weeks. If all okay are OV in 3-4 months with me and her routine follow-up with cardiology. Reviewed notes urology advise trimethoprim suppression would have her check with pharmacy and cardiology about her concerns about drug interactions risk-benefit.  -Patient advised to return or notify health care team  if symptoms worsen ,persist or new concerns  arise.  Patient Instructions  Stay on the twice a day  protonix  Sent in to pharkacy .   Ok to use the antibiotic  ointment for lid inflammation.   Get lab appt for cbc  Bmp in about 3 weeks   I will put in orders . You get the lab time.   If all ok then ROV in 3-4 months      Admit date: 06/30/2016 Discharge date: 07/01/2016  Admitted From: Home Disposition: Home  Recommendations for Outpatient Follow-up:  1. Follow up with PCP in 1 weeks 2. Follow up with cardiologist in 1-2 weeks  Discharge Condition: FULL CODE STATUS: FULL Diet recommendation: Heart Healthy    Brief/Interim Summary: 82 yo female with PMH of Nonobstructive CAD by catheterization on 3/15, proximal A. Fib (on amiodarone 200 daily, and Eliquis 2.5 mg twice a day), s/p St. Jude PPM, diastolic CHF, hypertension, hyperlipidemia, anemia, and CKD who presented to the [facility] ED with reports of centralized chest burning/discomfort on 8/17.   Assessment & Plan:  1. Chest pain: This is atypical in character and seems to be recurrent in nature, including in June 2016 when she had elevated troponin but flat and then March of this year around when she had had a normal catheterization. She is followed by Dr. Haroldine Laws in the heart failure clinic.   I have extremely low clinical suspicion for dissection, and do not think this warrants further testing. I also have low clinical suspicion for  PE.  -Serial troponins are ordered and have been flat.  -Telemetry monitoring, no acute findings.  --Abnormal CXR: do not feel patient has pneumonia with normal temp, normal WBC, no cough or other symptoms to suggest pneumonia.  She has chronic interstitial lung disease and fibrosis with superimposed chronic CHF.  She says that she feels back to her baseline now.  Close outpatient follow up arranged for reassessment.    -Consult to Cardiology, appreciate recommendations - they felt symptoms most likely GI but recommended  close outpatient follow up with her cardiologist. OK to discharge home.    2. Afib: CHADs2Vasc 5. Pacer in place. -Continue amiodarone -Continue apixaban, renally dosed  3. Hypothyroidism: -Continue levothyroxine  4. CHF: EF 123456, grade 2 diastolic dysfunction. Appears euvolemic. -Continue Demadex and K -Continue statin  5. Anemia: Stable, chronic, normocytic. -Continue iron but reduced dose to 1 tab per day  6. GERD:  -Continue PPI, increased dose to BID at discharge  DVT prophylaxis:apixaban Diet:NPO until cleared by Cardiology Code Status:Full Family Communication:None present Disposition Plan:Anticipate overnight observation for arrhythmia on telemetry, serial troponins and subsequent risk stratification by Cardiology.   Consultants:  cardiology  Procedures:  Pt refusing stress testing  Discharge Diagnoses:  Principal Problem:   Atypical chest pain Active Problems:   HYPERTENSION, BENIGN   Hypothyroidism   Chronic combined systolic and diastolic CHF (congestive heart failure) (HCC)   Pacemaker   CKD (chronic kidney disease) stage 4, GFR 15-29 ml/min (HCC)   PAF (paroxysmal atrial fibrillation) [facility])   Discharge Instructions      Discharge Instructions    Increase activity slowly    Complete by:  As directed      Standley Brooking. Davontae Prusinski M.D.

## 2016-07-19 ENCOUNTER — Ambulatory Visit (INDEPENDENT_AMBULATORY_CARE_PROVIDER_SITE_OTHER): Payer: Medicare Other | Admitting: Internal Medicine

## 2016-07-19 ENCOUNTER — Encounter: Payer: Self-pay | Admitting: Internal Medicine

## 2016-07-19 VITALS — BP 146/70 | HR 70 | Temp 98.0°F | Ht <= 58 in | Wt 135.4 lb

## 2016-07-19 DIAGNOSIS — R0789 Other chest pain: Secondary | ICD-10-CM

## 2016-07-19 DIAGNOSIS — D696 Thrombocytopenia, unspecified: Secondary | ICD-10-CM

## 2016-07-19 DIAGNOSIS — Z09 Encounter for follow-up examination after completed treatment for conditions other than malignant neoplasm: Secondary | ICD-10-CM | POA: Diagnosis not present

## 2016-07-19 DIAGNOSIS — Z7901 Long term (current) use of anticoagulants: Secondary | ICD-10-CM

## 2016-07-19 DIAGNOSIS — H01009 Unspecified blepharitis unspecified eye, unspecified eyelid: Secondary | ICD-10-CM

## 2016-07-19 DIAGNOSIS — N183 Chronic kidney disease, stage 3 unspecified: Secondary | ICD-10-CM

## 2016-07-19 DIAGNOSIS — IMO0001 Reserved for inherently not codable concepts without codable children: Secondary | ICD-10-CM

## 2016-07-19 DIAGNOSIS — R54 Age-related physical debility: Secondary | ICD-10-CM

## 2016-07-19 DIAGNOSIS — N184 Chronic kidney disease, stage 4 (severe): Secondary | ICD-10-CM

## 2016-07-19 MED ORDER — PANTOPRAZOLE SODIUM 40 MG PO TBEC: 40.0000 mg | DELAYED_RELEASE_TABLET | Freq: Two times a day (BID) | ORAL | 4 refills | Status: DC

## 2016-07-19 MED ORDER — ERYTHROMYCIN 5 MG/GM OP OINT: 1.0000 "application " | TOPICAL_OINTMENT | Freq: Two times a day (BID) | OPHTHALMIC | 1 refills | Status: DC

## 2016-07-19 NOTE — Patient Instructions (Signed)
Stay on the twice a day  protonix  Sent in to pharkacy .   Ok to use the antibiotic  ointment for lid inflammation.   Get lab appt for cbc  Bmp in about 3 weeks   I will put in orders . You get the lab time.   If all ok then ROV in 3-4 months

## 2016-07-20 ENCOUNTER — Ambulatory Visit (INDEPENDENT_AMBULATORY_CARE_PROVIDER_SITE_OTHER): Payer: Medicare Other | Admitting: *Deleted

## 2016-07-20 DIAGNOSIS — I4891 Unspecified atrial fibrillation: Secondary | ICD-10-CM

## 2016-07-20 DIAGNOSIS — Z950 Presence of cardiac pacemaker: Secondary | ICD-10-CM | POA: Diagnosis not present

## 2016-07-20 NOTE — Progress Notes (Signed)
Remote pacemaker transmission.   

## 2016-07-22 ENCOUNTER — Encounter: Payer: Self-pay | Admitting: Cardiology

## 2016-07-29 LAB — CUP PACEART REMOTE DEVICE CHECK
Battery Voltage: 2.98 V
Brady Statistic AP VP Percent: 99 %
Brady Statistic AS VP Percent: 1 %
Brady Statistic AS VS Percent: 1 %
Brady Statistic RA Percent Paced: 99 %
Date Time Interrogation Session: 20170906080017
Implantable Lead Implant Date: 20150610
Implantable Lead Implant Date: 20150610
Implantable Lead Location: 753859
Implantable Lead Model: 5076
Lead Channel Impedance Value: 510 Ohm
Lead Channel Pacing Threshold Amplitude: 0.5 V
Lead Channel Pacing Threshold Pulse Width: 0.4 ms
Lead Channel Sensing Intrinsic Amplitude: 4.3 mV
Lead Channel Setting Pacing Amplitude: 2 V
Lead Channel Setting Pacing Amplitude: 2.5 V
Lead Channel Setting Pacing Pulse Width: 0.4 ms
MDC IDC LEAD LOCATION: 753860
MDC IDC MSMT BATTERY REMAINING LONGEVITY: 100 mo
MDC IDC MSMT BATTERY REMAINING PERCENTAGE: 95.5 %
MDC IDC MSMT LEADCHNL RA IMPEDANCE VALUE: 460 Ohm
MDC IDC MSMT LEADCHNL RA PACING THRESHOLD PULSEWIDTH: 0.4 ms
MDC IDC MSMT LEADCHNL RA SENSING INTR AMPL: 5 mV
MDC IDC MSMT LEADCHNL RV PACING THRESHOLD AMPLITUDE: 1.25 V
MDC IDC SET LEADCHNL RV SENSING SENSITIVITY: 2 mV
MDC IDC STAT BRADY AP VS PERCENT: 1 %
MDC IDC STAT BRADY RV PERCENT PACED: 99 %
Pulse Gen Model: 2240
Pulse Gen Serial Number: 7627922

## 2016-08-01 ENCOUNTER — Emergency Department (HOSPITAL_COMMUNITY): Payer: Medicare Other

## 2016-08-01 ENCOUNTER — Encounter (HOSPITAL_COMMUNITY): Payer: Self-pay | Admitting: *Deleted

## 2016-08-01 ENCOUNTER — Telehealth: Payer: Self-pay | Admitting: Internal Medicine

## 2016-08-01 DIAGNOSIS — Z7901 Long term (current) use of anticoagulants: Secondary | ICD-10-CM | POA: Diagnosis not present

## 2016-08-01 DIAGNOSIS — R079 Chest pain, unspecified: Secondary | ICD-10-CM | POA: Diagnosis present

## 2016-08-01 DIAGNOSIS — Z79899 Other long term (current) drug therapy: Secondary | ICD-10-CM | POA: Insufficient documentation

## 2016-08-01 DIAGNOSIS — I110 Hypertensive heart disease with heart failure: Secondary | ICD-10-CM | POA: Diagnosis not present

## 2016-08-01 DIAGNOSIS — I5033 Acute on chronic diastolic (congestive) heart failure: Secondary | ICD-10-CM | POA: Diagnosis not present

## 2016-08-01 DIAGNOSIS — R51 Headache: Secondary | ICD-10-CM | POA: Insufficient documentation

## 2016-08-01 DIAGNOSIS — E039 Hypothyroidism, unspecified: Secondary | ICD-10-CM | POA: Insufficient documentation

## 2016-08-01 DIAGNOSIS — I2510 Atherosclerotic heart disease of native coronary artery without angina pectoris: Secondary | ICD-10-CM | POA: Diagnosis not present

## 2016-08-01 LAB — BASIC METABOLIC PANEL
Anion gap: 8 (ref 5–15)
BUN: 36 mg/dL — ABNORMAL HIGH (ref 6–20)
CALCIUM: 9.2 mg/dL (ref 8.9–10.3)
CO2: 26 mmol/L (ref 22–32)
CREATININE: 2.07 mg/dL — AB (ref 0.44–1.00)
Chloride: 107 mmol/L (ref 101–111)
GFR calc non Af Amer: 20 mL/min — ABNORMAL LOW (ref >60)
GFR, EST AFRICAN AMERICAN: 23 mL/min — AB (ref >60)
Glucose, Bld: 128 mg/dL — ABNORMAL HIGH (ref 65–99)
Potassium: 4.1 mmol/L (ref 3.5–5.1)
SODIUM: 141 mmol/L (ref 135–145)

## 2016-08-01 LAB — CBC
HCT: 39.8 % (ref 36.0–46.0)
Hemoglobin: 12.8 g/dL (ref 12.0–15.0)
MCH: 31.1 pg (ref 26.0–34.0)
MCHC: 32.2 g/dL (ref 30.0–36.0)
MCV: 96.8 fL (ref 78.0–100.0)
PLATELETS: 125 10*3/uL — AB (ref 150–400)
RBC: 4.11 MIL/uL (ref 3.87–5.11)
RDW: 14.2 % (ref 11.5–15.5)
WBC: 4.4 10*3/uL (ref 4.0–10.5)

## 2016-08-01 LAB — I-STAT TROPONIN, ED: TROPONIN I, POC: 0.03 ng/mL (ref 0.00–0.08)

## 2016-08-01 NOTE — Telephone Encounter (Signed)
[name] went to see the pt today and her back pain 10 out 10  And her pain did improve to 8 out of 10 before [name] left . Pt bp was before session 101/60 and heart rate 70 and after session 94/49. Please advise

## 2016-08-01 NOTE — Telephone Encounter (Signed)
send this information to her cardiology  Team to have them answer this concern  About low  BP and medication  She is in the  cardiology clinic on meds that can lower BP.

## 2016-08-01 NOTE — ED Triage Notes (Signed)
Pt reports that this morning she woke up with a lump on the back of her head (denies injury) and has had headache since then. Reports having a pain in her chest this morning for about 3 hours.

## 2016-08-02 ENCOUNTER — Emergency Department (HOSPITAL_COMMUNITY): Payer: Medicare Other

## 2016-08-02 ENCOUNTER — Emergency Department (HOSPITAL_COMMUNITY)
Admission: EM | Admit: 2016-08-02 | Discharge: 2016-08-02 | Disposition: A | Discharge status: Home / Self Care | Payer: Medicare Other | Attending: Emergency Medicine | Admitting: Emergency Medicine

## 2016-08-02 ENCOUNTER — Encounter (HOSPITAL_COMMUNITY): Payer: Self-pay | Admitting: Radiology

## 2016-08-02 DIAGNOSIS — R51 Headache: Secondary | ICD-10-CM

## 2016-08-02 DIAGNOSIS — R0789 Other chest pain: Secondary | ICD-10-CM

## 2016-08-02 DIAGNOSIS — R519 Headache, unspecified: Secondary | ICD-10-CM

## 2016-08-02 LAB — URINE MICROSCOPIC-ADD ON: RBC / HPF: NONE SEEN RBC/hpf (ref 0–5)

## 2016-08-02 LAB — URINALYSIS, ROUTINE W REFLEX MICROSCOPIC
BILIRUBIN URINE: NEGATIVE
GLUCOSE, UA: NEGATIVE mg/dL
HGB URINE DIPSTICK: NEGATIVE
KETONES UR: NEGATIVE mg/dL
Nitrite: NEGATIVE
PROTEIN: NEGATIVE mg/dL
Specific Gravity, Urine: 1.015 (ref 1.005–1.030)
pH: 6.5 (ref 5.0–8.0)

## 2016-08-02 LAB — BRAIN NATRIURETIC PEPTIDE: B NATRIURETIC PEPTIDE 5: 260.5 pg/mL — AB (ref 0.0–100.0)

## 2016-08-02 LAB — I-STAT TROPONIN, ED: TROPONIN I, POC: 0.03 ng/mL (ref 0.00–0.08)

## 2016-08-02 NOTE — ED Provider Notes (Signed)
TIME SEEN: 4:35 AM  CHIEF COMPLAINT: HA, chest pain  HPI: Pt is a 82 yo female with PMH of Nonobstructive CAD by catheterization on 3/15, proximal A. Fib (on amiodarone 200 daily, and Eliquis 2.5 mg twice a day), s/p St. Jude PPM, diastolic CHF, hypertension, hyperlipidemia, anemia, and CKD who presents emergency department complaints of headache that started yesterday morning around 11 AM. Describes it as gradual onset, throbbing mostly in the back of her head. She took Tylenol home which improved her headache. States headache went away until she was in the waiting room when it came back but was milder. She currently has no headache. Denies any fall, head injury but is on request. Denies numbness, tingling or focal weakness. Also had an episode of chest pain where she describes as feeling that "something cold was there". States this lasted for around 30 minutes and 11 AM. No pressure, tightness, throbbing, burning, aching, sharp, tearing pain. Denies associated shortness of breath, nausea vomiting, diaphoresis or dizziness. Has had a mild dry cough. No fever.  ROS: See HPI Constitutional: no fever  Eyes: no drainage  ENT: no runny nose   Cardiovascular:   chest pain  Resp: no SOB  GI: no vomiting GU: no dysuria Integumentary: no rash  Allergy: no hives  Musculoskeletal: no leg swelling  Neurological: no slurred speech ROS otherwise negative  PAST MEDICAL HISTORY/PAST SURGICAL HISTORY:  Past Medical History:  Diagnosis Date  . Anemia   . Atrial fibrillation ([place])    a. Dx 01/2014->Eliquis started.  . Cancer ([place])   . Chest pain    a. 2011 Neg MV;  b. 01/2014 Cath: LM 20-30, LAD nl, D1 nl, LCX nl, OM1 nl, RCA dom 30-63m, PD/PL nl, EF 65%->Med Rx; c. 03/2016 MV: no ischemia/infarct. EF 63%.  . Degenerative joint disease   . Diastolic CHF, acute on chronic ([place]) 01/31/2012  . Dyslipidemia   . Dysrhythmia   . Family history of adverse reaction to anesthesia    " multiple family members have  difficulty waking "  . GERD (gastroesophageal reflux disease)   . History of skin cancer    tafeen/ non melanoma.   Marland Kitchen Hx of varicella   . Hypertension   . Hypothyroidism   . Monoclonal gammopathies   . Pacemaker   . Palpitations    PVCs/bigeminy on event in May 2009 revealing this was relatively asymptomatic  . Right lower quadrant abdominal pain 07/19/2012   Recurrent  With nausea    This is new since she had ct for llq pain in [facility]    R/o appendiceal problem  Hernia  Get ct scan  And plan  Fu     . Thrombocytopenia ([place])     MEDICATIONS:  Prior to Admission medications   Medication Sig Start Date End Date Taking? Authorizing Provider  acetaminophen (TYLENOL) 325 MG tablet Take 2 tablets (650 mg total) by mouth every 4 (four) hours as needed for headache or mild pain. 09/26/14   Isaiah Serge, NP  amiodarone (PACERONE) 200 MG tablet TAKE ONE TABLET BY MOUTH ONCE DAILY 05/23/16   Evans Lance, MD  denosumab (PROLIA) 60 MG/ML SOLN injection Inject 60 mg into the skin every 6 (six) months. Administer in upper arm, thigh, or abdomen    Historical Provider, MD  ELIQUIS 2.5 MG TABS tablet TAKE ONE TABLET BY MOUTH TWICE DAILY 03/14/16   Jolaine Artist, MD  erythromycin ophthalmic ointment Place 1 application into both eyes 2 (two) times  daily. 07/19/16   Burnis Medin, MD  Ferrous Sulfate (IRON) 325 (65 Fe) MG TABS Take 1 tablet by mouth daily. 07/01/16   Clanford Marisa Hua, MD  levothyroxine (SYNTHROID, LEVOTHROID) 75 MCG tablet TAKE ONE TABLET BY MOUTH ONCE DAILY 02/29/16   Burnis Medin, MD  loratadine (CLARITIN) 10 MG tablet Take 10 mg by mouth at bedtime as needed for allergies or rhinitis.     Historical Provider, MD  meclizine (ANTIVERT) 12.5 MG tablet Take 1 tablet (12.5 mg total) by mouth 2 (two) times daily as needed for dizziness. 03/09/16   Jolaine Artist, MD  metolazone (ZAROXOLYN) 2.5 MG tablet Take 1 tablet (2.5 mg total) by mouth as needed (for weight 134 lb or  greater). Patient not taking: Reported on 07/19/2016 03/09/16   Jolaine Artist, MD  nitroGLYCERIN (NITROSTAT) 0.4 MG SL tablet Place 1 tablet (0.4 mg total) under the tongue every 5 (five) minutes as needed. For chest pain 05/06/16   Jolaine Artist, MD  oxyCODONE-acetaminophen (PERCOCET/ROXICET) 5-325 MG tablet Take 1 tablet by mouth daily as needed for pain. 06/09/16   Historical Provider, MD  pantoprazole (PROTONIX) 40 MG tablet Take 1 tablet (40 mg total) by mouth 2 (two) times daily. 07/19/16   Burnis Medin, MD  potassium chloride SA (K-DUR,KLOR-CON) 20 MEQ tablet Take 3 tablets (60 mEq total) by mouth daily. Take an additional 18meq with Metolazone. 04/03/16   Isaiah Serge, NP  simvastatin (ZOCOR) 10 MG tablet TAKE ONE TABLET BY MOUTH ONCE DAILY AT BEDTIME 05/31/16   Jolaine Artist, MD  torsemide (DEMADEX) 20 MG tablet Take 2 tablets (40 mg total) by mouth daily. 04/03/16   Isaiah Serge, NP    ALLERGIES:  Allergies  Allergen Reactions  . Other Shortness Of Breath    Detergents, perfumes and other strong odor emitting compounds  . Tramadol Shortness Of Breath and Nausea Only  . Ibuprofen Other (See Comments)    Told not to take med    SOCIAL HISTORY:  Social History  Substance Use Topics  . Smoking status: Never Smoker  . Smokeless tobacco: Never Used  . Alcohol use No    FAMILY HISTORY: Family History  Problem Relation Age of Onset  . Coronary artery disease Father   . Heart disease Father   . Heart attack Father   . Alzheimer's disease Mother   . Cancer Brother 53    lung cancer  . Lung cancer      EXAM: BP 120/56 (BP Location: Right Arm)   Pulse 70   Temp 97.8 F (36.6 C)   Resp 12   Wt 135 lb (61.2 kg)   SpO2 99%   BMI 28.22 kg/m  CONSTITUTIONAL: Alert and oriented and responds appropriately to questions. Well-appearing; well-nourished, Elderly, in no distress, afebrile, nontoxic, appears well-hydrated HEAD: Normocephalic, Atraumatic EYES:  Conjunctivae clear, PERRL ENT: normal nose; no rhinorrhea; moist mucous membranes NECK: Supple, no meningismus, no LAD  CARD: RRR; S1 and S2 appreciated; no murmurs, no clicks, no rubs, no gallops RESP: Normal chest excursion without splinting or tachypnea; breath sounds clear and equal bilaterally; no wheezes, no rhonchi, no rales, no hypoxia or respiratory distress, speaking full sentences ABD/GI: Normal bowel sounds; non-distended; soft, non-tender, no rebound, no guarding, no peritoneal signs BACK:  The back appears normal and is non-tender to palpation, there is no CVA tenderness EXT: Normal ROM in all joints; non-tender to palpation; no edema; normal capillary refill; no cyanosis, no  calf tenderness or swelling    SKIN: Normal color for age and race; warm; no rash NEURO: Moves all extremities equally, sensation to light touch intact diffusely, cranial nerves II through XII intact, strength 5/5 in all 4 extremities no dysmetria to finger-to-nose testing bilaterally, no dysarthria or aphasia PSYCH: The patient's mood and manner are appropriate. Grooming and personal hygiene are appropriate.  MEDICAL DECISION MAKING: Patient here with headache. Had 2 intermittent episodes. Reports to me that she has had similar headaches. Was not sudden onset or the worst headache of her life. Doubt subarachnoid hemorrhage. She is neurologically intact. Afebrile without meningismus. Given she is elderly and on Eliquis, will obtain a head CT.   Patient also complaining of feeling something "cold" on her chest today. She denies to me that this was truly any pain. No other associated symptoms. Symptoms of chest discomfort have resolved since yesterday around noon. Will obtain second troponin. First troponin negative. EKG shows no new ischemic changes.  ED PROGRESS: Patient's second troponin is negative. BNP is near her baseline. She does not appear volume overloaded today. Head CT shows no acute abnormality. Urine  shows trace leukocytes and rare bacteria. She is not having urinary symptoms. We'll send a culture. Patient is still feeling well and hemodynamically stable. I feel she is safe to be discharged home with her family. They're comfortable with this plan. Discussed return precautions.   At this time, I do not feel there is any life-threatening condition present. I have reviewed and discussed all results (EKG, imaging, lab, urine as appropriate), exam findings with patient/family. I have reviewed nursing notes and appropriate previous records.  I feel the patient is safe to be discharged home without further emergent workup and can continue workup as an outpatient as needed. Discussed usual and customary return precautions. Patient/family verbalize understanding and are comfortable with this plan.  Outpatient follow-up has been provided. All questions have been answered.      EKG Interpretation  Date/Time:  Monday August 01 2016 20:59:43 EDT Ventricular Rate:  70 PR Interval:  184 QRS Duration: 160 QT Interval:  500 QTC Calculation: 540 R Axis:   127 Text Interpretation:  AV dual-paced rhythm Abnormal ECG No significant change since last tracing Confirmed by Goro Wenrick,  DO, Dovie Kapusta [phone]) on 08/02/2016 4:32:45 AM         [place], DO 08/02/16 [id number]

## 2016-08-03 ENCOUNTER — Encounter: Payer: Self-pay | Admitting: Internal Medicine

## 2016-08-03 ENCOUNTER — Telehealth: Payer: Self-pay | Admitting: Internal Medicine

## 2016-08-03 LAB — URINE CULTURE: CULTURE: NO GROWTH

## 2016-08-03 NOTE — Telephone Encounter (Signed)
Ok to cancel  The labs  As requested Because she indeed have them in the ed  ,   Lab Results  Component Value Date   WBC 4.4 08/01/2016   HGB 12.8 08/01/2016   HCT 39.8 08/01/2016   PLT 125 (L) 08/01/2016   GLUCOSE 128 (H) 08/01/2016   CHOL 141 04/14/2014   TRIG 56 04/14/2014   HDL 89 04/14/2014   LDLCALC 41 04/14/2014   ALT 13 (L) 06/30/2016   AST 16 06/30/2016   NA 141 08/01/2016   K 4.1 08/01/2016   CL 107 08/01/2016   CREATININE 2.07 (H) 08/01/2016   BUN 36 (H) 08/01/2016   CO2 26 08/01/2016   TSH 1.472 03/31/2016   INR 1.44 07/30/2015

## 2016-08-03 NOTE — Telephone Encounter (Signed)
Pt went to ED yesterday and had labs done.  Pt wants to know if she should still come in on 9/26 because they did these same labs.

## 2016-08-04 NOTE — Telephone Encounter (Signed)
Pt notified that lab orders and appointment have been cancelled per Dr. Regis Bill.

## 2016-08-05 ENCOUNTER — Encounter: Payer: Self-pay | Admitting: Cardiology

## 2016-08-09 ENCOUNTER — Other Ambulatory Visit: Payer: Medicare Other

## 2016-08-17 ENCOUNTER — Encounter: Payer: Self-pay | Admitting: Internal Medicine

## 2016-08-17 ENCOUNTER — Telehealth: Payer: Self-pay | Admitting: Internal Medicine

## 2016-08-17 ENCOUNTER — Ambulatory Visit (INDEPENDENT_AMBULATORY_CARE_PROVIDER_SITE_OTHER): Payer: Medicare Other | Admitting: Internal Medicine

## 2016-08-17 VITALS — BP 140/70 | Temp 98.2°F | Ht <= 58 in | Wt 136.4 lb

## 2016-08-17 DIAGNOSIS — Z23 Encounter for immunization: Secondary | ICD-10-CM

## 2016-08-17 DIAGNOSIS — R1030 Lower abdominal pain, unspecified: Secondary | ICD-10-CM

## 2016-08-17 DIAGNOSIS — N184 Chronic kidney disease, stage 4 (severe): Secondary | ICD-10-CM | POA: Diagnosis not present

## 2016-08-17 DIAGNOSIS — R51 Headache: Secondary | ICD-10-CM

## 2016-08-17 DIAGNOSIS — Z7901 Long term (current) use of anticoagulants: Secondary | ICD-10-CM

## 2016-08-17 DIAGNOSIS — R1031 Right lower quadrant pain: Secondary | ICD-10-CM

## 2016-08-17 DIAGNOSIS — R519 Headache, unspecified: Secondary | ICD-10-CM

## 2016-08-17 DIAGNOSIS — S51812A Laceration without foreign body of left forearm, initial encounter: Secondary | ICD-10-CM

## 2016-08-17 LAB — POCT URINALYSIS DIPSTICK
Bilirubin, UA: NEGATIVE
Blood, UA: NEGATIVE
Glucose, UA: NEGATIVE
KETONES UA: NEGATIVE
NITRITE UA: NEGATIVE
PH UA: 6
PROTEIN UA: NEGATIVE
Spec Grav, UA: 1.025
UROBILINOGEN UA: 1

## 2016-08-17 NOTE — Progress Notes (Signed)
Pre visit review using our clinic review tool, if applicable. No additional management support is needed unless otherwise documented below in the visit note. 

## 2016-08-17 NOTE — Progress Notes (Signed)
Chief Complaint  Patient presents with  . Rt groin pain    Pain in groin started at midnight.  Pain in head comes and goes.  More than a dull ache but not stabbing.  . Pain in back of head  . Wound on left arm    HPI: [name] 82 y.o. comes in today for acute visit sent in by home health for multiple concerns. Brought by daughter  She is an 5 year old with degenerative joint disease recurrent chest pain cardiac disease on any coagulation from A. Fib who lives alone.  No fall but bumped into   Corner furniture   And had tear on left arm noted by PT   Cleaned and  Peroxide and  Antibiotic ointment .   Awake middle of night eith sever r groin pain she has had before  No assoc sx  And after 1-2 hours took pain pill    Resolved after 2.5 hours      Some constipation   recnetly Tried  otc  No help so far  No vomiting   uti sx but urology gaver her something for urin but she didn't take it cause of concern about med interaction.  ( no fever chills cough or fall)   Head pain off and on back of head  Not persistent and no assoc sx   Has had both of these pains before   Also hallucinating  X 2  ? If after pain med  Saw dark figure at night scared her  Also  heard voice in next room .  Now better . Scared. at night  About sleep  ROS: See pertinent positives and negatives per HPI. No cp sob  New bleeding vision change   Past Medical History:  Diagnosis Date  . Anemia   . Atrial fibrillation ([place])    a. Dx 01/2014->Eliquis started.  . Cancer ([place])   . Chest pain    a. 2011 Neg MV;  b. 01/2014 Cath: LM 20-30, LAD nl, D1 nl, LCX nl, OM1 nl, RCA dom 30-9m, PD/PL nl, EF 65%->Med Rx; c. 03/2016 MV: no ischemia/infarct. EF 63%.  . Degenerative joint disease   . Diastolic CHF, acute on chronic ([place]) 01/31/2012  . Dyslipidemia   . Dysrhythmia   . Family history of adverse reaction to anesthesia    " multiple family members have difficulty waking "  . GERD (gastroesophageal reflux  disease)   . History of skin cancer    tafeen/ non melanoma.   Marland Kitchen Hx of varicella   . Hypertension   . Hypothyroidism   . Monoclonal gammopathies   . Pacemaker   . Palpitations    PVCs/bigeminy on event in May 2009 revealing this was relatively asymptomatic  . Right lower quadrant abdominal pain 07/19/2012   Recurrent  With nausea    This is new since she had ct for llq pain in [facility]    R/o appendiceal problem  Hernia  Get ct scan  And plan  Fu     . Thrombocytopenia ([place])     Family History  Problem Relation Age of Onset  . Coronary artery disease Father   . Heart disease Father   . Heart attack Father   . Alzheimer's disease Mother   . Cancer Brother 60    lung cancer  . Lung cancer      Social History   Social History  . Marital status: Widowed    Spouse name: N/A  .  Number of children: 3  . Years of education: N/A   Occupational History  .  Retired    [employer], book keeping   Social History Main Topics  . Smoking status: Never Smoker  . Smokeless tobacco: Never Used  . Alcohol use No  . Drug use: No  . Sexual activity: No   Other Topics Concern  . 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 Eats with her.      Daughterrenee handles medications. Sees HH and PT once a week.   Helper  Lorre Nick t the Friday 1-3 pm    Outpatient Medications Prior to Visit  Medication Sig Dispense Refill  . acetaminophen (TYLENOL) 325 MG tablet Take 2 tablets (650 mg total) by mouth every 4 (four) hours as needed for headache or mild pain.    Marland Kitchen amiodarone (PACERONE) 200 MG tablet TAKE ONE TABLET BY MOUTH ONCE DAILY 30 tablet 4  . denosumab (PROLIA) 60 MG/ML SOLN injection Inject 60 mg into the skin every 6 (six) months. Administer in upper arm, thigh, or  abdomen    . ELIQUIS 2.5 MG TABS tablet TAKE ONE TABLET BY MOUTH TWICE DAILY 60 tablet 6  . erythromycin ophthalmic ointment Place 1 application into both eyes 2 (two) times daily. 3.5 g 1  . Ferrous Sulfate (IRON) 325 (65 Fe) MG TABS Take 1 tablet by mouth daily. 180 each 0  . levothyroxine (SYNTHROID, LEVOTHROID) 75 MCG tablet TAKE ONE TABLET BY MOUTH ONCE DAILY 90 tablet 3  . loratadine (CLARITIN) 10 MG tablet Take 10 mg by mouth at bedtime as needed for allergies or rhinitis.     Marland Kitchen nitroGLYCERIN (NITROSTAT) 0.4 MG SL tablet Place 1 tablet (0.4 mg total) under the tongue every 5 (five) minutes as needed. For chest pain 25 tablet 6  . oxyCODONE-acetaminophen (PERCOCET/ROXICET) 5-325 MG tablet Take 1 tablet by mouth daily as needed for pain.  0  . pantoprazole (PROTONIX) 40 MG tablet Take 1 tablet (40 mg total) by mouth 2 (two) times daily. 60 tablet 4  . potassium chloride SA (K-DUR,KLOR-CON) 20 MEQ tablet Take 3 tablets (60 mEq total) by mouth daily. Take an additional 59meq with Metolazone. 90 tablet 2  . simvastatin (ZOCOR) 10 MG tablet TAKE ONE TABLET BY MOUTH ONCE DAILY AT BEDTIME 30 tablet 3  . torsemide (DEMADEX) 20 MG tablet Take 2 tablets (40 mg total) by mouth daily. 60 tablet 6  . meclizine (ANTIVERT) 12.5 MG tablet Take 1 tablet (12.5 mg total) by mouth 2 (two) times daily as needed for dizziness. (Patient not taking: Reported on 08/17/2016) 10 tablet 0  . metolazone (ZAROXOLYN) 2.5 MG tablet Take 1 tablet (2.5 mg total) by mouth as needed (for weight 134 lb or greater). (Patient not taking: Reported on 08/17/2016) 10 tablet 3   No facility-administered medications prior to visit.      EXAM:  BP 140/70 (BP Location: Right Arm, Patient Position: Sitting, Cuff Size: Normal)   Temp 98.2 F (36.8 C) (Oral)   Ht 4\' 10"  (1.473 m)   Wt 136 lb 6.4 oz (61.9 kg)   BMI 28.51 kg/m   Body mass index is  28.51 kg/m.  GENERAL: vitals reviewed and listed above, alert, oriented, appears  well hydrated and in no acute distress walks with cane with daughter   HEENT: atraumatic, conjunctiva  clear, no obvious abnormalities on inspection of external nose and ears OP : no lesion edema or exudate  NECK: no obvious masses on inspection palpation  LUNGS: clear to auscultation bilaterally, no wheezes, rales or rhonchi, good air movement kypohosis  CV: HRRR, no clubbing cyanosis or   Slight edema nl cap refill  MS: moves all extremities without noticeable focal  Abnormality  Left foreaarm with avulsion laceration about 2 cm  Clean not bleeding   No hematoma or deformity  Walks with gcane normal for her  No new pain   ( some low back pain)  PSYCH: pleasant and cooperative, no obvious depression or anxiety Lab Results  Component Value Date   WBC 4.4 08/01/2016   HGB 12.8 08/01/2016   HCT 39.8 08/01/2016   PLT 125 (L) 08/01/2016   GLUCOSE 128 (H) 08/01/2016   CHOL 141 04/14/2014   TRIG 56 04/14/2014   HDL 89 04/14/2014   LDLCALC 41 04/14/2014   ALT 13 (L) 06/30/2016   AST 16 06/30/2016   NA 141 08/01/2016   K 4.1 08/01/2016   CL 107 08/01/2016   CREATININE 2.07 (H) 08/01/2016   BUN 36 (H) 08/01/2016   CO2 26 08/01/2016   TSH 1.472 03/31/2016   INR 1.44 07/30/2015    ASSESSMENT AND PLAN:  Discussed the following assessment and plan:  Severe right groin pain - Plan: POC Urinalysis Dipstick, Culture, Urine  Skin tear of forearm without complication, left, initial encounter  CKD (chronic kidney disease) stage 4, GFR 15-29 ml/min (HCC)  Chronic anticoagulation  Nonintractable episodic headache, unspecified headache type  Need for prophylactic vaccination and inoculation against influenza - Plan: Flu vaccine HIGH DOSE PF (Fluzone High dose) Not sure waht to make of her sx complex looks baseline at this time ? If renal stone atypical uti ms pain   Episodes of seeing things  ? If related to med or not. Seems cgnitivey intact with nl speech for her and non focal exam    -Patient advised to return or notify health care team  if symptoms worsen ,persist or new concerns arise. Head CT scan in the last few weeks no acute findings had abdominal CT scan done in the last few months no acute findings but does have diverticulosis. We'll treat her constipation and see how she does and rule out UTI. She looks pretty good right now although I cannot make a firm diagnosis of her symptoms. It's possible she has a hip problem but I would not expect her to be able to walk baseline without difficulty at this time. Consideration of x-ray in future. Also doesn't know what to make of her "hallucinations". Possibly in the influence of pain medicine. She plans on remaining living alone despite all of her medications and complex issues. She is reassured me that she has not had a fall.  Patient Instructions  Not sure  Why  You had the pain   Sound like hip but usually doesn't get better . Checking for  Urine infection  . It is possible that pain meds can cause  Hallucination type siude effects.   And thus limit these   To absolutely necessary   .  And then when with attending.   Get some one to fix the edges in your home to avoid further  Injury.  [place] for flu vaccine today  Non of  Your sx seem  Cardiovascular today .   Trail of miralax  1 cap per day  Until better  to help the constipation in the interim .  Will let you know  When  Urine  Culture is back .    Standley Brooking. Cheick Suhr M.D.

## 2016-08-17 NOTE — Telephone Encounter (Signed)
Ms whitfield is calling pt was up last night  with severe right side groin pain and head pain. Pt has skin tear on her left wrist. PT has been sch for an appt today

## 2016-08-17 NOTE — Patient Instructions (Addendum)
Not sure  Why  You had the pain   Sound like hip but usually doesn't get better . Checking for  Urine infection  . It is possible that pain meds can cause  Hallucination type siude effects.   And thus limit these   To absolutely necessary   .  And then when with attending.   Get some one to fix the edges in your home to avoid further  Injury.  [place] for flu vaccine today  Non of  Your sx seem  Cardiovascular today .   Trail of miralax  1 cap per day  Until better  to help the constipation in the interim .  Will let you know  When  Urine  Culture is back .

## 2016-08-19 ENCOUNTER — Encounter (HOSPITAL_COMMUNITY): Payer: Self-pay | Admitting: Emergency Medicine

## 2016-08-19 ENCOUNTER — Emergency Department (HOSPITAL_COMMUNITY): Payer: Medicare Other

## 2016-08-19 ENCOUNTER — Telehealth: Payer: Self-pay | Admitting: Internal Medicine

## 2016-08-19 ENCOUNTER — Emergency Department (HOSPITAL_COMMUNITY)
Admission: EM | Admit: 2016-08-19 | Discharge: 2016-08-19 | Disposition: A | Discharge status: Home / Self Care | Payer: Medicare Other | Attending: Emergency Medicine | Admitting: Emergency Medicine

## 2016-08-19 DIAGNOSIS — Z950 Presence of cardiac pacemaker: Secondary | ICD-10-CM | POA: Diagnosis not present

## 2016-08-19 DIAGNOSIS — Z85828 Personal history of other malignant neoplasm of skin: Secondary | ICD-10-CM | POA: Diagnosis not present

## 2016-08-19 DIAGNOSIS — G44229 Chronic tension-type headache, not intractable: Secondary | ICD-10-CM | POA: Insufficient documentation

## 2016-08-19 DIAGNOSIS — E039 Hypothyroidism, unspecified: Secondary | ICD-10-CM | POA: Insufficient documentation

## 2016-08-19 DIAGNOSIS — R51 Headache: Secondary | ICD-10-CM | POA: Diagnosis present

## 2016-08-19 DIAGNOSIS — N184 Chronic kidney disease, stage 4 (severe): Secondary | ICD-10-CM | POA: Insufficient documentation

## 2016-08-19 DIAGNOSIS — I5042 Chronic combined systolic (congestive) and diastolic (congestive) heart failure: Secondary | ICD-10-CM | POA: Insufficient documentation

## 2016-08-19 DIAGNOSIS — I130 Hypertensive heart and chronic kidney disease with heart failure and stage 1 through stage 4 chronic kidney disease, or unspecified chronic kidney disease: Secondary | ICD-10-CM | POA: Diagnosis not present

## 2016-08-19 LAB — BASIC METABOLIC PANEL
Anion gap: 10 (ref 5–15)
BUN: 28 mg/dL — AB (ref 6–20)
CALCIUM: 9.1 mg/dL (ref 8.9–10.3)
CHLORIDE: 105 mmol/L (ref 101–111)
CO2: 26 mmol/L (ref 22–32)
CREATININE: 2.26 mg/dL — AB (ref 0.44–1.00)
GFR calc non Af Amer: 18 mL/min — ABNORMAL LOW (ref >60)
GFR, EST AFRICAN AMERICAN: 21 mL/min — AB (ref >60)
Glucose, Bld: 100 mg/dL — ABNORMAL HIGH (ref 65–99)
Potassium: 4.7 mmol/L (ref 3.5–5.1)
SODIUM: 141 mmol/L (ref 135–145)

## 2016-08-19 LAB — URINE CULTURE: ORGANISM ID, BACTERIA: NO GROWTH

## 2016-08-19 LAB — SEDIMENTATION RATE: SED RATE: 11 mm/h (ref 0–22)

## 2016-08-19 LAB — CBG MONITORING, ED: GLUCOSE-CAPILLARY: 91 mg/dL (ref 65–99)

## 2016-08-19 LAB — CBC
HCT: 41.7 % (ref 36.0–46.0)
Hemoglobin: 13.4 g/dL (ref 12.0–15.0)
MCH: 30.9 pg (ref 26.0–34.0)
MCHC: 32.1 g/dL (ref 30.0–36.0)
MCV: 96.3 fL (ref 78.0–100.0)
PLATELETS: 128 10*3/uL — AB (ref 150–400)
RBC: 4.33 MIL/uL (ref 3.87–5.11)
RDW: 14.1 % (ref 11.5–15.5)
WBC: 4.5 10*3/uL (ref 4.0–10.5)

## 2016-08-19 MED ORDER — SODIUM CHLORIDE 0.9 % IV BOLUS (SEPSIS)
500.0000 mL | Freq: Once | INTRAVENOUS | Status: AC
  Administered 2016-08-19: 500 mL via INTRAVENOUS

## 2016-08-19 MED ORDER — BUPIVACAINE HCL (PF) 0.5 % IJ SOLN
10.0000 mL | Freq: Once | INTRAMUSCULAR | Status: AC
  Administered 2016-08-19: 10 mL
  Filled 2016-08-19: qty 10

## 2016-08-19 MED ORDER — METOCLOPRAMIDE HCL 5 MG/ML IJ SOLN
5.0000 mg | Freq: Once | INTRAMUSCULAR | Status: AC
  Administered 2016-08-19: 5 mg via INTRAVENOUS
  Filled 2016-08-19: qty 2

## 2016-08-19 MED ORDER — DIPHENHYDRAMINE HCL 50 MG/ML IJ SOLN
12.5000 mg | Freq: Once | INTRAMUSCULAR | Status: AC
  Administered 2016-08-19: 12.5 mg via INTRAVENOUS
  Filled 2016-08-19: qty 1

## 2016-08-19 MED ORDER — ACETAMINOPHEN 500 MG PO TABS
1000.0000 mg | ORAL_TABLET | Freq: Once | ORAL | Status: AC
  Administered 2016-08-19: 1000 mg via ORAL
  Filled 2016-08-19: qty 2

## 2016-08-19 NOTE — Discharge Instructions (Signed)
Please read and follow all provided instructions.  Your diagnoses today include:  1. Chronic tension-type headache, not intractable    Tests performed today include: CT of your head which was normal and did not show any serious cause of your headache Vital signs. See below for your results today.   Medications:  In the Emergency Department you received: Reglan - antinausea/headache medication Benadryl - antihistamine to counteract potential side effects of reglan Toradol - NSAID medication similar to ibuprofen  Take any prescribed medications only as directed.  Additional information:  Follow any educational materials contained in this packet.  You are having a headache. No specific cause was found today for your headache. It may have been a migraine or other cause of headache. Stress, anxiety, fatigue, and depression are common triggers for headaches.   Your headache today does not appear to be life-threatening or require hospitalization, but often the exact cause of headaches is not determined in the emergency department. Therefore, follow-up with your doctor is very important to find out what may have caused your headache and whether or not you need any further diagnostic testing or treatment.   Sometimes headaches can appear benign (not harmful), but then more serious symptoms can develop which should prompt an immediate re-evaluation by your doctor or the emergency department.  BE VERY CAREFUL not to take multiple medicines containing Tylenol (also called acetaminophen). Doing so can lead to an overdose which can damage your liver and cause liver failure and possibly death.   Follow-up instructions: Please follow-up with your primary care provider in the next 3 days for further evaluation of your symptoms.   Return instructions:  Please return to the Emergency Department if you experience worsening symptoms. Return if the medications do not resolve your headache, if it recurs,  or if you have multiple episodes of vomiting or cannot keep down fluids. Return if you have a change from the usual headache. RETURN IMMEDIATELY IF you: Develop a sudden, severe headache Develop confusion or become poorly responsive or faint Develop a fever above 100.41F or problem breathing Have a change in speech, vision, swallowing, or understanding Develop new weakness, numbness, tingling, incoordination in your arms or legs Have a seizure Please return if you have any other emergent concerns.  Additional Information:  Your vital signs today were: BP (!) 114/54 (BP Location: Left Arm)    Pulse 70    Temp 98.1 F (36.7 C) (Oral)    Resp 14    Ht 4\' 10"  (1.473 m)    Wt 61.7 kg    SpO2 99%    BMI 28.42 kg/m  If your blood pressure (BP) was elevated above 135/85 this visit, please have this repeated by your doctor within one month. --------------

## 2016-08-19 NOTE — ED Notes (Signed)
PA at the bedside.

## 2016-08-19 NOTE — ED Provider Notes (Signed)
[facility] DEPT Provider Note   CSN: [id number] Arrival date & time: 08/19/16  8341  History   Chief Complaint Chief Complaint  Patient presents with  . Headache    HPI [name] is a 82 y.o. female.  HPI  82 y.o. female with a hx of Afib on Eliquis, Nonobstructive CAD, St Jude PPM, Diastolic CHF, HTN, HLD, Anemia, CKD, presents to the Emergency Department today complaining of headache since Wednesday. Hx of the same. Seen in ED in past for same. States headache was gradual in onset and located on left lateral aspect of head. Notes no visual changes. No blurred vision. No loss of vision. No N/V. No CP/SOB/ABD pain. Notes pain 8/10 and gradually getting better in ED. States that the headaches come in waves. Notes taking oxycodone this AM around 0200 with minimal relief. Told by PCP to come to ED for evaluation. No other symptoms noted.    Past Medical History:  Diagnosis Date  . Anemia   . Atrial fibrillation ([place])    a. Dx 01/2014->Eliquis started.  . Cancer ([place])   . Chest pain    a. 2011 Neg MV;  b. 01/2014 Cath: LM 20-30, LAD nl, D1 nl, LCX nl, OM1 nl, RCA dom 30-73m PD/PL nl, EF 65%->Med Rx; c. 03/2016 MV: no ischemia/infarct. EF 63%.  . Degenerative joint disease   . Diastolic CHF, acute on chronic ([place] 01/31/2012  . Dyslipidemia   . Dysrhythmia   . Family history of adverse reaction to anesthesia    " multiple family members have difficulty waking "  . GERD (gastroesophageal reflux disease)   . History of skin cancer    tafeen/ non melanoma.   .Marland KitchenHx of varicella   . Hypertension   . Hypothyroidism   . Monoclonal gammopathies   . Pacemaker   . Palpitations    PVCs/bigeminy on event in May 2009 revealing this was relatively asymptomatic  . Right lower quadrant abdominal pain 07/19/2012   Recurrent  With nausea    This is new since she had ct for llq pain in [facility]    R/o appendiceal problem  Hernia  Get ct scan  And plan  Fu     . Thrombocytopenia ([facility]      Patient Active Problem List   Diagnosis Date Noted  . Hypertensive heart disease 04/01/2016  . PAF (paroxysmal atrial fibrillation) ([place] 03/31/2016  . Chest pain with moderate risk for cardiac etiology, negative MI and Nuc study, + muscular skeletal   . Atypical chest pain 01/21/2016  . CKD (chronic kidney disease) stage 4, GFR 15-29 ml/min (HCC) 05/09/2015  . Anemia of chronic disease 05/09/2015  . Dyslipidemia 05/09/2015  . Iron deficiency anemia 11/21/2014  . Pacemaker 04/27/2014  . Chronic anticoagulation 02/25/2014  . Chronic combined systolic and diastolic CHF (congestive heart failure) ([place] 02/24/2014  . Atrial fibrillation ([place] 01/29/2014  . MGUS (monoclonal gammopathy of unknown significance) 08/01/2013  . Hypothyroidism 02/19/2012  . Hyperlipidemia 12/15/2008  . HYPERTENSION, BENIGN 12/15/2008    Past Surgical History:  Procedure Laterality Date  . CARDIOVERSION N/A 02/26/2014   Procedure: CARDIOVERSION AT BEDSIDE;  Surgeon: KPixie Casino MD;  [facility]  Service: Cardiovascular;  Laterality: N/A;  . CARDIOVERSION N/A 04/22/2014   Procedure: CARDIOVERSION (BEDSIDE);  Surgeon: TSueanne Margarita MD;  [facility];  Service: Cardiovascular;  Laterality: N/A;  . CATARACT EXTRACTION     Bilateral  implantt  . CESAREAN SECTION     times 2  .  CHOLECYSTECTOMY  1999  . ESOPHAGOGASTRODUODENOSCOPY N/A 07/06/2014   Procedure: ESOPHAGOGASTRODUODENOSCOPY (EGD);  Surgeon: Ladene Artist, MD;  Location: [facility] ENDOSCOPY;  Service: Endoscopy;  Laterality: N/A;  . INSERT / REPLACE / REMOVE PACEMAKER    . LEFT HEART CATHETERIZATION WITH CORONARY ANGIOGRAM N/A 01/29/2014   Procedure: LEFT HEART CATHETERIZATION WITH CORONARY ANGIOGRAM;  Surgeon: Blane Ohara, MD;  Location: [facility] CATH LAB;  Service: Cardiovascular;  Laterality: N/A;  . PACEMAKER INSERTION  04/23/2014   STJ Assurity dual chamber pacemaker implanted by Dr Rayann Heman  . PERMANENT PACEMAKER INSERTION N/A 04/23/2014    Procedure: PERMANENT PACEMAKER INSERTION;  Surgeon: Evans Lance, MD;  [facility] LAB;  Service: Cardiovascular;  Laterality: N/A;  . RIGHT HEART CATHETERIZATION N/A 09/22/2014   Procedure: RIGHT HEART CATH;  Surgeon: Jolaine Artist, MD;  Location: [facility] CATH LAB;  Service: Cardiovascular;  Laterality: N/A;  . SHOULDER SURGERY  1996   Right     OB History    No data available       Home Medications    Prior to Admission medications   Medication Sig Start Date End Date Taking? Authorizing Provider  acetaminophen (TYLENOL) 325 MG tablet Take 2 tablets (650 mg total) by mouth every 4 (four) hours as needed for headache or mild pain. 09/26/14   Isaiah Serge, NP  amiodarone (PACERONE) 200 MG tablet TAKE ONE TABLET BY MOUTH ONCE DAILY 05/23/16   Evans Lance, MD  denosumab (PROLIA) 60 MG/ML SOLN injection Inject 60 mg into the skin every 6 (six) months. Administer in upper arm, thigh, or abdomen    Historical Provider, MD  ELIQUIS 2.5 MG TABS tablet TAKE ONE TABLET BY MOUTH TWICE DAILY 03/14/16   Jolaine Artist, MD  erythromycin ophthalmic ointment Place 1 application into both eyes 2 (two) times daily. 07/19/16   Burnis Medin, MD  Ferrous Sulfate (IRON) 325 (65 Fe) MG TABS Take 1 tablet by mouth daily. 07/01/16   Clanford Marisa Hua, MD  levothyroxine (SYNTHROID, LEVOTHROID) 75 MCG tablet TAKE ONE TABLET BY MOUTH ONCE DAILY 02/29/16   Burnis Medin, MD  loratadine (CLARITIN) 10 MG tablet Take 10 mg by mouth at bedtime as needed for allergies or rhinitis.     Historical Provider, MD  meclizine (ANTIVERT) 12.5 MG tablet Take 1 tablet (12.5 mg total) by mouth 2 (two) times daily as needed for dizziness. Patient not taking: Reported on 08/17/2016 03/09/16   Jolaine Artist, MD  metolazone (ZAROXOLYN) 2.5 MG tablet Take 1 tablet (2.5 mg total) by mouth as needed (for weight 134 lb or greater). Patient not taking: Reported on 08/17/2016 03/09/16   Jolaine Artist, MD  nitroGLYCERIN  (NITROSTAT) 0.4 MG SL tablet Place 1 tablet (0.4 mg total) under the tongue every 5 (five) minutes as needed. For chest pain 05/06/16   Jolaine Artist, MD  oxyCODONE-acetaminophen (PERCOCET/ROXICET) 5-325 MG tablet Take 1 tablet by mouth daily as needed for pain. 06/09/16   Historical Provider, MD  pantoprazole (PROTONIX) 40 MG tablet Take 1 tablet (40 mg total) by mouth 2 (two) times daily. 07/19/16   Burnis Medin, MD  potassium chloride SA (K-DUR,KLOR-CON) 20 MEQ tablet Take 3 tablets (60 mEq total) by mouth daily. Take an additional 74mq with Metolazone. 04/03/16   LIsaiah Serge NP  simvastatin (ZOCOR) 10 MG tablet TAKE ONE TABLET BY MOUTH ONCE DAILY AT BEDTIME 05/31/16   DJolaine Artist MD  torsemide (DEMADEX) 20 MG tablet  Take 2 tablets (40 mg total) by mouth daily. 04/03/16   Isaiah Serge, NP    Family History Family History  Problem Relation Age of Onset  . Coronary artery disease Father   . Heart disease Father   . Heart attack Father   . Alzheimer's disease Mother   . Cancer Brother 59    lung cancer  . Lung cancer      Social History Social History  Substance Use Topics  . Smoking status: Never Smoker  . Smokeless tobacco: Never Used  . Alcohol use No     Allergies   Other; Tramadol; and Ibuprofen   Review of Systems Review of Systems ROS reviewed and all are negative for acute change except as noted in the HPI.  Physical Exam Updated Vital Signs Ht '4\' 10"'  (1.473 m)   Wt 61.7 kg   SpO2 96%   BMI 28.42 kg/m   Physical Exam  Constitutional: She is oriented to person, place, and time. Vital signs are normal. She appears well-developed and well-nourished.  HENT:  Head: Normocephalic.  Right Ear: Hearing normal.  Left Ear: Hearing normal.  Eyes: Conjunctivae and EOM are normal. Pupils are equal, round, and reactive to light.  Neck: Normal range of motion.  Cardiovascular: Normal rate, regular rhythm, normal heart sounds and intact distal pulses.  Exam  reveals no gallop and no friction rub.   No murmur heard. Pulmonary/Chest: Effort normal and breath sounds normal.  Abdominal: Soft.  Musculoskeletal: Normal range of motion.  Neurological: She is alert and oriented to person, place, and time. She has normal strength. No cranial nerve deficit or sensory deficit.  Cranial Nerves:  II: Pupils equal, round, reactive to light III,IV, VI: ptosis not present, extra-ocular motions intact bilaterally  V,VII: smile symmetric, facial light touch sensation equal VIII: hearing grossly normal bilaterally  IX,X: midline uvula rise  XI: bilateral shoulder shrug equal and strong XII: midline tongue extension  Skin: Skin is warm and dry.  Psychiatric: She has a normal mood and affect. Her speech is normal and behavior is normal. Thought content normal.  Nursing note and vitals reviewed.  ED Treatments / Results  Labs (all labs ordered are listed, but only abnormal results are displayed) Labs Reviewed  CBC - Abnormal; Notable for the following:       Result Value   Platelets 128 (*)    All other components within normal limits  BASIC METABOLIC PANEL - Abnormal; Notable for the following:    Glucose, Bld 100 (*)    BUN 28 (*)    Creatinine, Ser 2.26 (*)    GFR calc non Af Amer 18 (*)    GFR calc Af Amer 21 (*)    All other components within normal limits  SEDIMENTATION RATE  CBG MONITORING, ED   EKG  EKG Interpretation  Date/Time:  Friday August 19 2016 06:46:42 EDT Ventricular Rate:  70 PR Interval:    QRS Duration: 160 QT Interval:  504 QTC Calculation: 544 R Axis:   57 Text Interpretation:  AV dual-paced rhythm When compared with ECG of 08/01/2016, No significant change was found Confirmed by [facility]  MD, DAVID ([id number]) on 08/19/2016 6:51:18 AM       Radiology Ct Head Wo Contrast  Result Date: 08/19/2016 CLINICAL DATA:  Left side headache. EXAM: CT HEAD WITHOUT CONTRAST TECHNIQUE: Contiguous axial images were obtained from the base  of the skull through the vertex without intravenous contrast. COMPARISON:  08/02/2016 FINDINGS: Brain: There  is atrophy and chronic small vessel disease changes. No acute intracranial abnormality. Specifically, no hemorrhage, hydrocephalus, mass lesion, acute infarction, or significant intracranial injury. Vascular: No hyperdense vessel or unexpected calcification. Skull: No acute calvarial abnormality. Sinuses/Orbits: Visualized paranasal sinuses and mastoids clear. Orbital soft tissues unremarkable. Other: None IMPRESSION: No acute intracranial abnormality. Atrophy, chronic microvascular disease. Electronically Signed   By: Rolm Baptise M.D.   On: 08/19/2016 08:32    Procedures Procedures (including critical care time)  Medications Ordered in ED Medications - No data to display   Initial Impression / Assessment and Plan / ED Course  I have reviewed the triage vital signs and the nursing notes.  Pertinent labs & imaging results that were available during my care of the patient were reviewed by me and considered in my medical decision making (see chart for details).  Clinical Course   Final Clinical Impressions(s) / ED Diagnoses  I have reviewed and evaluated the relevant laboratory values I have reviewed and evaluated the relevant imaging studies.  I have interpreted the relevant EKG. I have reviewed the relevant previous healthcare records. I have reviewed EMS Documentation. I obtained HPI from historian. Patient discussed with supervising physician  ED Course:  Assessment: Pt is a 52yF with hx  Afib on Eliquis, Nonobstructive CAD, St Jude PPM, Diastolic CHF, HTN, HLD, Anemia, CKD, who presents with headache since Wednesday. Gradual in onset. Hx similar in past. No relief with oxycodone. Gradually improving in ED. On exam, pt in NAD. Nontoxic/nonseptic appearing. VSS. Afebrile. Lungs CTA. Heart RRR. Abdomen nontender soft. CN evaluated and unremarkable. CBC/BMP unremarkable. ESR obtained due  to possible Temporal arteritis, which was negative. CT Head unremarkable. Given migraine cocktail in ED with reglan and benadryl. Discussed with supervising physician who has seen patient. Attempted trigger point injection on neck musculature with moderate relief. Symptoms consistent with tension type headache. Plan is to DC home with follow up to PCP. At time of discharge, Patient is in no acute distress. Vital Signs are stable. Patient is able to ambulate. Patient able to tolerate PO.    Disposition/Plan:  DC Home Additional Verbal discharge instructions given and discussed with patient.  Pt Instructed to f/u with PCP in the next week for evaluation and treatment of symptoms. Return precautions given Pt acknowledges and agrees with plan  Supervising Physician Leo Grosser, MD   Final diagnoses:  Chronic tension-type headache, not intractable    New Prescriptions New Prescriptions   No medications on file     Shary Decamp, PA-C 08/19/16 [id number]    Leo Grosser, MD 08/19/16 [phone]

## 2016-08-19 NOTE — Discharge Planning (Signed)
Disposition/Plan:  DC Home Additional Verbal discharge instructions given and discussed with patient.  Pt Instructed to f/u with PCP in the next week for evaluation and treatment of symptoms. Return precautions given Pt acknowledges and agrees with plan  [facility] reviewed discharging chart for possible CM needs.  No needs identified.

## 2016-08-19 NOTE — Telephone Encounter (Signed)
PT need verbal orders for continue visits for 2 times for 2 weeks.

## 2016-08-19 NOTE — ED Triage Notes (Signed)
Pt brought to ED by [facility] 9/10 Left side HA radiating to her neck since last Wed, progressively getting worse. Pt AO x 4. No neuro deficit noticed. Pt on blood thinners for A fib, pt took Oxycodone at home as prescribe by PCP with no relief. BP 128/69, HR 70, R 19, SPO2 96% RA.

## 2016-08-19 NOTE — Telephone Encounter (Signed)
Ok to Continue  Pt

## 2016-08-22 NOTE — Telephone Encounter (Signed)
Spoke to [place] and informed her to continue PT with the pt.  Danielle did state that PT will be done in 2 weeks and will reevaluate at that time.

## 2016-09-06 IMAGING — CR DG CHEST 1V PORT
1 series · 1 of 1 positions shown · non-contrast
Comparison: Radiograph 02/10/2015

CLINICAL DATA: Chest pain, vomiting, hypertension

EXAM:
PORTABLE CHEST - 1 VIEW

[AP]
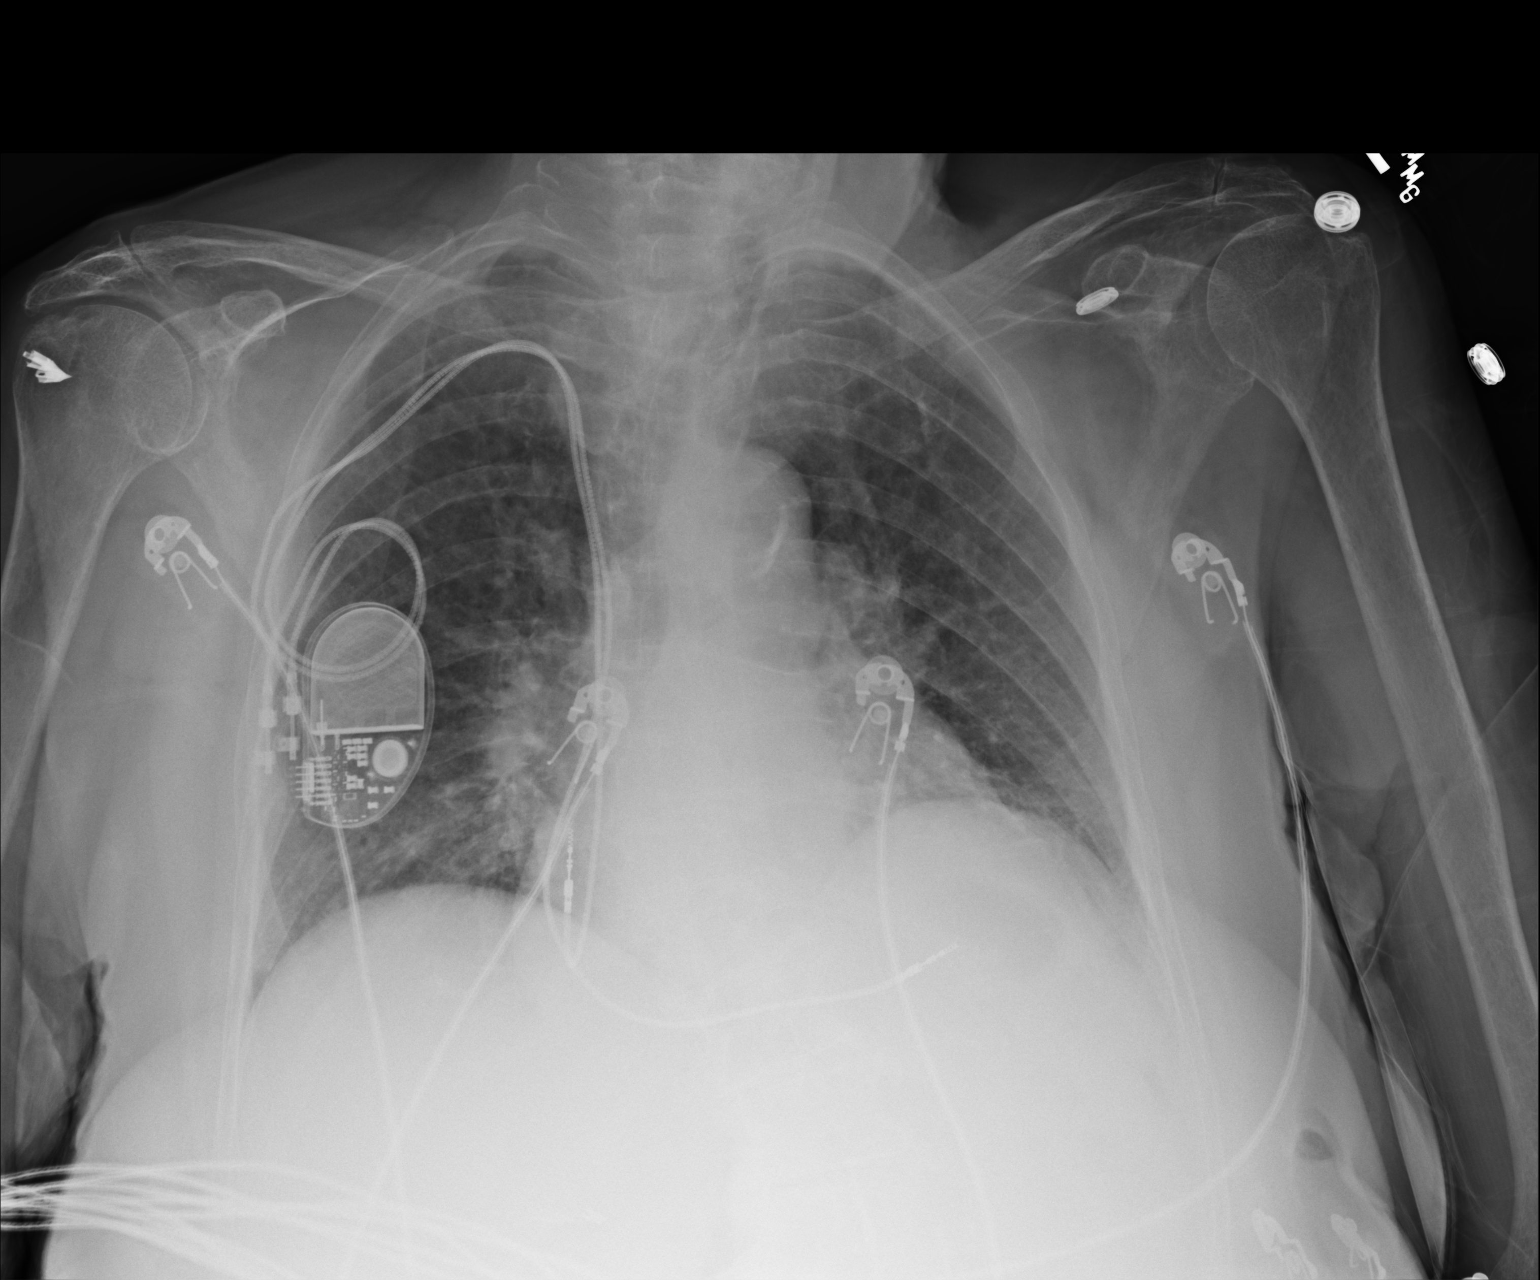

[1 of 1 positions shown; findings below may reference images not displayed]

FINDINGS: Left-sided pacemaker overlies normal cardiac silhouette. No
effusion, infiltrate, or pneumothorax. Chronic bronchitic markings
are noted.
IMPRESSION: Chronic bronchitic pulmonary markings.  No acute findings.

## 2016-09-14 ENCOUNTER — Emergency Department (HOSPITAL_COMMUNITY)
Admission: EM | Admit: 2016-09-14 | Discharge: 2016-09-14 | Disposition: A | Discharge status: Home / Self Care | Payer: Medicare Other | Attending: Emergency Medicine | Admitting: Emergency Medicine

## 2016-09-14 ENCOUNTER — Other Ambulatory Visit (HOSPITAL_COMMUNITY): Payer: Self-pay | Admitting: *Deleted

## 2016-09-14 ENCOUNTER — Encounter (HOSPITAL_COMMUNITY): Payer: Self-pay | Admitting: *Deleted

## 2016-09-14 ENCOUNTER — Telehealth: Payer: Self-pay | Admitting: Internal Medicine

## 2016-09-14 DIAGNOSIS — E039 Hypothyroidism, unspecified: Secondary | ICD-10-CM | POA: Insufficient documentation

## 2016-09-14 DIAGNOSIS — N184 Chronic kidney disease, stage 4 (severe): Secondary | ICD-10-CM | POA: Insufficient documentation

## 2016-09-14 DIAGNOSIS — I83811 Varicose veins of right lower extremities with pain: Secondary | ICD-10-CM | POA: Diagnosis not present

## 2016-09-14 DIAGNOSIS — I8390 Asymptomatic varicose veins of unspecified lower extremity: Secondary | ICD-10-CM

## 2016-09-14 DIAGNOSIS — Z79899 Other long term (current) drug therapy: Secondary | ICD-10-CM | POA: Insufficient documentation

## 2016-09-14 DIAGNOSIS — Z85828 Personal history of other malignant neoplasm of skin: Secondary | ICD-10-CM | POA: Diagnosis not present

## 2016-09-14 DIAGNOSIS — I130 Hypertensive heart and chronic kidney disease with heart failure and stage 1 through stage 4 chronic kidney disease, or unspecified chronic kidney disease: Secondary | ICD-10-CM | POA: Insufficient documentation

## 2016-09-14 DIAGNOSIS — I5042 Chronic combined systolic (congestive) and diastolic (congestive) heart failure: Secondary | ICD-10-CM | POA: Diagnosis not present

## 2016-09-14 DIAGNOSIS — M79604 Pain in right leg: Secondary | ICD-10-CM | POA: Diagnosis present

## 2016-09-14 MED ORDER — MECLIZINE HCL 12.5 MG PO TABS: 12.5000 mg | ORAL_TABLET | Freq: Two times a day (BID) | ORAL | 0 refills | Status: AC | PRN

## 2016-09-14 NOTE — ED Triage Notes (Signed)
[facility], pt complains of right leg pain since yesterday. Pt has varicose vein in right leg from top of of lower leg to her foot that has been enlarged for the past 2 days.

## 2016-09-14 NOTE — Telephone Encounter (Signed)
[name] N DOB: 1925/01/06 Initial Comment Caller states, she has some varicose veins - they are large and raised and painful on her legs. She has funny pain in her chest. Verified Nurse Assessment Nurse: Christel Mormon, RN, Levada Dy Date/Time (Eastern Time): 09/14/2016 3:53:32 PM Confirm and document reason for call. If symptomatic, describe symptoms. You must click the next button to save text entered. ---Caller states left leg has varicose veins. She states the pain started getting bad yesterday afternoon and this am "really aching". She has been raising her leg and used "part of a compression stocking" where the veins are. She states her chest started feeling funny an hour and half ago. She states "it feels like it's cold inside and an ache inside and when that happens, I cant breathe that good". Has the patient traveled out of the country within the last 30 days? ---No Does the patient have any new or worsening symptoms? ---Yes Will a triage be completed? ---Yes Related visit to physician within the last 2 weeks? ---No Does the PT have any chronic conditions? (i.e. diabetes, asthma, etc.) ---Yes List chronic conditions. ---varicose veins, CHF, A.Fib, Is this a behavioral health or substance abuse call? ---No Guidelines Guideline Title Affirmed Question Affirmed Notes Chest Pain [1] Chest pain lasts > 5 minutes AND [2] age > 57

## 2016-09-14 NOTE — Discharge Instructions (Signed)
You have varicose veins. No sign of blood clots or phlebitis.  We've placed Ace wrap on your legs is you have not tolerated using compression hose.  Wear the Ace wraps during the day. Elevate your legs whenever possible.  Continue your Eliquis as currently prescribed.

## 2016-09-14 NOTE — ED Provider Notes (Signed)
[facility] [place] DEPT Provider Note   CSN: [id number] Arrival date & time: 09/14/16  1646     History   Chief Complaint Chief Complaint  Patient presents with  . Leg Pain    HPI [name] is a 82 y.o. female.  Presents with a complaint regarding the veins in her leg.  She's had varicose veins for long time. She states that been more prominent for several weeks. She called her doctor today. She is not able to speak with his physician or provider. She states that the goal person on the phone told me to come the emergency room. She does not have pain. She does not have swelling. Her veins are prominent in her leg. She states she used to just elevate them and it would go away. Was prescribed compression hose. She states stable to get them on herself, but cannot take them off and this "makes him really anxious and claustrophobic". States she has to call her daughter to help her get them off and on  HPI  Past Medical History:  Diagnosis Date  . Anemia   . Atrial fibrillation ([place])    a. Dx 01/2014->Eliquis started.  . Cancer ([place])   . Chest pain    a. 2011 Neg MV;  b. 01/2014 Cath: LM 20-30, LAD nl, D1 nl, LCX nl, OM1 nl, RCA dom 30-45m, PD/PL nl, EF 65%->Med Rx; c. 03/2016 MV: no ischemia/infarct. EF 63%.  . Degenerative joint disease   . Diastolic CHF, acute on chronic ([place]) 01/31/2012  . Dyslipidemia   . Dysrhythmia   . Family history of adverse reaction to anesthesia    " multiple family members have difficulty waking "  . GERD (gastroesophageal reflux disease)   . History of skin cancer    tafeen/ non melanoma.   Marland Kitchen Hx of varicella   . Hypertension   . Hypothyroidism   . Monoclonal gammopathies   . Pacemaker   . Palpitations    PVCs/bigeminy on event in May 2009 revealing this was relatively asymptomatic  . Right lower quadrant abdominal pain 07/19/2012   Recurrent  With nausea    This is new since she had ct for llq pain in [facility]    R/o appendiceal problem  Hernia  Get  ct scan  And plan  Fu     . Thrombocytopenia [facility])     Patient Active Problem List   Diagnosis Date Noted  . Hypertensive heart disease 04/01/2016  . PAF (paroxysmal atrial fibrillation) ([place]) 03/31/2016  . Chest pain with moderate risk for cardiac etiology, negative MI and Nuc study, + muscular skeletal   . Atypical chest pain 01/21/2016  . CKD (chronic kidney disease) stage 4, GFR 15-29 ml/min (HCC) 05/09/2015  . Anemia of chronic disease 05/09/2015  . Dyslipidemia 05/09/2015  . Iron deficiency anemia 11/21/2014  . Pacemaker 04/27/2014  . Chronic anticoagulation 02/25/2014  . Chronic combined systolic and diastolic CHF (congestive heart failure) ([place]) 02/24/2014  . Atrial fibrillation ([place]) 01/29/2014  . MGUS (monoclonal gammopathy of unknown significance) 08/01/2013  . Hypothyroidism 02/19/2012  . Hyperlipidemia 12/15/2008  . HYPERTENSION, BENIGN 12/15/2008    Past Surgical History:  Procedure Laterality Date  . CARDIOVERSION N/A 02/26/2014   Procedure: CARDIOVERSION AT BEDSIDE;  Surgeon: Pixie Casino, MD;  Location: [place];  Service: Cardiovascular;  Laterality: N/A;  . CARDIOVERSION N/A 04/22/2014   Procedure: CARDIOVERSION (BEDSIDE);  Surgeon: [name], MD;  Location: [place];  Service: Cardiovascular;  Laterality: N/A;  .  CATARACT EXTRACTION     Bilateral  implantt  . CESAREAN SECTION     times 2  . CHOLECYSTECTOMY  1999  . ESOPHAGOGASTRODUODENOSCOPY N/A 07/06/2014   Procedure: ESOPHAGOGASTRODUODENOSCOPY (EGD);  Surgeon: Ladene Artist, MD;  Location: [facility] ENDOSCOPY;  Service: Endoscopy;  Laterality: N/A;  . INSERT / REPLACE / REMOVE PACEMAKER    . LEFT HEART CATHETERIZATION WITH CORONARY ANGIOGRAM N/A 01/29/2014   Procedure: LEFT HEART CATHETERIZATION WITH CORONARY ANGIOGRAM;  Surgeon: Blane Ohara, MD;  Location: [facility] CATH LAB;  Service: Cardiovascular;  Laterality: N/A;  . PACEMAKER INSERTION  04/23/2014   STJ Assurity dual chamber pacemaker implanted by Dr Rayann Heman   . PERMANENT PACEMAKER INSERTION N/A 04/23/2014   Procedure: PERMANENT PACEMAKER INSERTION;  Surgeon: Evans Lance, MD;  Location: [facility] CATH LAB;  Service: Cardiovascular;  Laterality: N/A;  . RIGHT HEART CATHETERIZATION N/A 09/22/2014   Procedure: RIGHT HEART CATH;  Surgeon: Jolaine Artist, MD;  Location: [facility] CATH LAB;  Service: Cardiovascular;  Laterality: N/A;  . SHOULDER SURGERY  1996   Right     OB History    No data available       Home Medications    Prior to Admission medications   Medication Sig Start Date End Date Taking? Authorizing Provider  acetaminophen (TYLENOL) 325 MG tablet Take 2 tablets (650 mg total) by mouth every 4 (four) hours as needed for headache or mild pain. 09/26/14   Isaiah Serge, NP  amiodarone (PACERONE) 200 MG tablet TAKE ONE TABLET BY MOUTH ONCE DAILY 05/23/16   Evans Lance, MD  denosumab (PROLIA) 60 MG/ML SOLN injection Inject 60 mg into the skin every 6 (six) months. Administer in upper arm, thigh, or abdomen    Historical Provider, MD  ELIQUIS 2.5 MG TABS tablet TAKE ONE TABLET BY MOUTH TWICE DAILY 03/14/16   Jolaine Artist, MD  erythromycin ophthalmic ointment Place 1 application into both eyes 2 (two) times daily. 07/19/16   Burnis Medin, MD  Ferrous Sulfate (IRON) 325 (65 Fe) MG TABS Take 1 tablet by mouth daily. 07/01/16   Clanford Marisa Hua, MD  levothyroxine (SYNTHROID, LEVOTHROID) 75 MCG tablet TAKE ONE TABLET BY MOUTH ONCE DAILY 02/29/16   Burnis Medin, MD  loratadine (CLARITIN) 10 MG tablet Take 10 mg by mouth at bedtime as needed for allergies or rhinitis.     Historical Provider, MD  meclizine (ANTIVERT) 12.5 MG tablet Take 1 tablet (12.5 mg total) by mouth 2 (two) times daily as needed for dizziness. 09/14/16   Jolaine Artist, MD  metolazone (ZAROXOLYN) 2.5 MG tablet Take 1 tablet (2.5 mg total) by mouth as needed (for weight 134 lb or greater). Patient not taking: Reported on 08/19/2016 03/09/16   Jolaine Artist, MD    nitroGLYCERIN (NITROSTAT) 0.4 MG SL tablet Place 1 tablet (0.4 mg total) under the tongue every 5 (five) minutes as needed. For chest pain 05/06/16   Jolaine Artist, MD  oxyCODONE-acetaminophen (PERCOCET/ROXICET) 5-325 MG tablet Take 1 tablet by mouth daily as needed for pain. 06/09/16   Historical Provider, MD  pantoprazole (PROTONIX) 40 MG tablet Take 1 tablet (40 mg total) by mouth 2 (two) times daily. 07/19/16   Burnis Medin, MD  potassium chloride SA (K-DUR,KLOR-CON) 20 MEQ tablet Take 3 tablets (60 mEq total) by mouth daily. Take an additional 74meq with Metolazone. 04/03/16   Isaiah Serge, NP  simvastatin (ZOCOR) 10 MG tablet TAKE ONE TABLET BY MOUTH ONCE  DAILY AT BEDTIME 05/31/16   Jolaine Artist, MD  torsemide (DEMADEX) 20 MG tablet Take 2 tablets (40 mg total) by mouth daily. 04/03/16   Isaiah Serge, NP    Family History Family History  Problem Relation Age of Onset  . Coronary artery disease Father   . Heart disease Father   . Heart attack Father   . Alzheimer's disease Mother   . Cancer Brother 80    lung cancer  . Lung cancer      Social History Social History  Substance Use Topics  . Smoking status: Never Smoker  . Smokeless tobacco: Never Used  . Alcohol use No     Allergies   Other; Tramadol; and Ibuprofen   Review of Systems Review of Systems  Constitutional: Negative for appetite change, chills, diaphoresis, fatigue and fever.  HENT: Negative for mouth sores, sore throat and trouble swallowing.   Eyes: Negative for visual disturbance.  Respiratory: Negative for cough, chest tightness, shortness of breath and wheezing.   Cardiovascular: Negative for chest pain.       Dilated leg veins  Gastrointestinal: Negative for abdominal distention, abdominal pain, diarrhea, nausea and vomiting.  Endocrine: Negative for polydipsia, polyphagia and polyuria.  Genitourinary: Negative for dysuria, frequency and hematuria.  Musculoskeletal: Negative for gait  problem.  Skin: Negative for color change, pallor and rash.  Neurological: Negative for dizziness, syncope, light-headedness and headaches.  Hematological: Does not bruise/bleed easily.  Psychiatric/Behavioral: Negative for behavioral problems and confusion.     Physical Exam Updated Vital Signs BP 115/58 (BP Location: Left Arm)   Pulse 70   Temp 97.8 F (36.6 C) (Oral)   Resp 15   SpO2 96%   Physical Exam  Constitutional: She is oriented to person, place, and time. She appears well-developed and well-nourished. No distress.  HENT:  Head: Normocephalic.  Eyes: Conjunctivae are normal. Pupils are equal, round, and reactive to light. No scleral icterus.  Neck: Normal range of motion. Neck supple. No thyromegaly present.  Cardiovascular: Normal rate and regular rhythm.  Exam reveals no gallop and no friction rub.   No murmur heard. Pulmonary/Chest: Effort normal and breath sounds normal. No respiratory distress. She has no wheezes. She has no rales.  Abdominal: Soft. Bowel sounds are normal. She exhibits no distension. There is no tenderness. There is no rebound.  Musculoskeletal: Normal range of motion.  Neurological: She is alert and oriented to person, place, and time.  Skin: Skin is warm and dry. No rash noted.  Bilateral lower extremities with marked and multiple varicose veins. Noted which are erythematous or inflamed or painful to her. No soft tissue swelling. Strong pulses and capillary refill.  Psychiatric: She has a normal mood and affect. Her behavior is normal.     ED Treatments / Results  Labs (all labs ordered are listed, but only abnormal results are displayed) Labs Reviewed - No data to display  EKG  EKG Interpretation None       Radiology No results found.  Procedures Procedures (including critical care time)  Medications Ordered in ED Medications - No data to display   Initial Impression / Assessment and Plan / ED Course  I have reviewed the  triage vital signs and the nursing notes.  Pertinent labs & imaging results that were available during my care of the patient were reviewed by me and considered in my medical decision making (see chart for details).  Clinical Course    Patient is on L plus.  Doubt DVT or SVT. No arterial findings. Normal blood supply, Refill, and pulses. Distress replaced on her legs. She was able to remove these herself. This made her muscles anxious than using compression hose. She is discharged home.  Final Clinical Impressions(s) / ED Diagnoses   Final diagnoses:  Varicose vein of leg    New Prescriptions Discharge Medication List as of 09/14/2016  6:09 PM       Tanna Furry, MD 09/14/16 1849

## 2016-09-14 NOTE — Telephone Encounter (Signed)
Pt has checked in to [facility] ED.

## 2016-09-15 ENCOUNTER — Ambulatory Visit: Payer: Medicare Other | Admitting: Internal Medicine
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
## 2016-09-22 ENCOUNTER — Ambulatory Visit: Payer: Medicare Other

## 2016-09-23 NOTE — Telephone Encounter (Signed)
See next phone note.

## 2016-09-29 ENCOUNTER — Other Ambulatory Visit (HOSPITAL_COMMUNITY): Payer: Self-pay | Admitting: Internal Medicine

## 2016-09-29 ENCOUNTER — Ambulatory Visit: Payer: Medicare Other

## 2016-10-03 ENCOUNTER — Encounter (HOSPITAL_COMMUNITY): Payer: Self-pay | Admitting: Internal Medicine

## 2016-10-03 ENCOUNTER — Ambulatory Visit (HOSPITAL_COMMUNITY)
Admission: RE | Admit: 2016-10-03 | Discharge: 2016-10-03 | Disposition: A | Discharge status: Home / Self Care | Payer: Medicare Other | Source: Ambulatory Visit | Attending: Internal Medicine | Admitting: Internal Medicine

## 2016-10-03 VITALS — BP 122/66 | HR 70 | Wt 130.4 lb

## 2016-10-03 DIAGNOSIS — N184 Chronic kidney disease, stage 4 (severe): Secondary | ICD-10-CM | POA: Diagnosis not present

## 2016-10-03 DIAGNOSIS — I480 Paroxysmal atrial fibrillation: Secondary | ICD-10-CM | POA: Diagnosis not present

## 2016-10-03 DIAGNOSIS — Z79899 Other long term (current) drug therapy: Secondary | ICD-10-CM | POA: Diagnosis not present

## 2016-10-03 DIAGNOSIS — I130 Hypertensive heart and chronic kidney disease with heart failure and stage 1 through stage 4 chronic kidney disease, or unspecified chronic kidney disease: Secondary | ICD-10-CM | POA: Diagnosis not present

## 2016-10-03 DIAGNOSIS — I481 Persistent atrial fibrillation: Secondary | ICD-10-CM | POA: Diagnosis not present

## 2016-10-03 DIAGNOSIS — Z950 Presence of cardiac pacemaker: Secondary | ICD-10-CM | POA: Diagnosis not present

## 2016-10-03 DIAGNOSIS — R0789 Other chest pain: Secondary | ICD-10-CM | POA: Diagnosis not present

## 2016-10-03 DIAGNOSIS — R001 Bradycardia, unspecified: Secondary | ICD-10-CM | POA: Insufficient documentation

## 2016-10-03 DIAGNOSIS — I5032 Chronic diastolic (congestive) heart failure: Secondary | ICD-10-CM | POA: Insufficient documentation

## 2016-10-03 DIAGNOSIS — I2510 Atherosclerotic heart disease of native coronary artery without angina pectoris: Secondary | ICD-10-CM | POA: Diagnosis not present

## 2016-10-03 DIAGNOSIS — Z7901 Long term (current) use of anticoagulants: Secondary | ICD-10-CM | POA: Diagnosis not present

## 2016-10-03 LAB — BASIC METABOLIC PANEL
Anion gap: 9 (ref 5–15)
BUN: 37 mg/dL — AB (ref 6–20)
CALCIUM: 9.6 mg/dL (ref 8.9–10.3)
CO2: 24 mmol/L (ref 22–32)
CREATININE: 2.19 mg/dL — AB (ref 0.44–1.00)
Chloride: 107 mmol/L (ref 101–111)
GFR calc non Af Amer: 19 mL/min — ABNORMAL LOW (ref >60)
GFR, EST AFRICAN AMERICAN: 22 mL/min — AB (ref >60)
Glucose, Bld: 97 mg/dL (ref 65–99)
Potassium: 4.8 mmol/L (ref 3.5–5.1)
Sodium: 140 mmol/L (ref 135–145)

## 2016-10-03 NOTE — Addendum Note (Signed)
Encounter addended by: Effie Berkshire, RN on: 10/03/2016 12:05 PM<BR>    Actions taken: Order list changed, Diagnosis association updated

## 2016-10-03 NOTE — Progress Notes (Signed)
Advanced Heart Failure Medication Review by a Pharmacist  Does the patient  feel that his/her medications are working for him/her?  yes  Has the patient been experiencing any side effects to the medications prescribed?  no  Does the patient measure his/her own blood pressure or blood glucose at home?  yes   Does the patient have any problems obtaining medications due to transportation or finances?   no  Understanding of regimen: good Understanding of indications: good Potential of compliance: good Patient understands to avoid NSAIDs. Patient understands to avoid decongestants.  Issues to address at subsequent visits: None   Pharmacist comments: Mr. Osterlund is a pleasant 82 yo F presenting with her daughter and without a medication list. She reports good compliance with her regimen and did not have any specific medication-related questions or concerns for me at this time.   Ruta Hinds. Velva Harman, PharmD, BCPS, CPP Clinical Pharmacist Pager: [phone] Phone: [phone] 10/03/2016 11:13 AM      Time with patient: 10 minutes Preparation and documentation time: 2 minutes Total time: 12 minutes

## 2016-10-03 NOTE — Patient Instructions (Signed)
Lab today  We will contact you in 4 months to schedule your next appointment.  

## 2016-10-03 NOTE — Progress Notes (Signed)
Patient ID: [name], female   DOB: [birth year], 82 y.o.   MRN: [id number]   Advanced Heart Failure Clinic Note   PCP: Dr Netty Starring Primary Cardiologist: Dr. Harrington Challenger Primary HF: Dr. Haroldine Laws   HPI: [name] is a 82 y.o. female with the history of nonobstructive CAD by cardiac catheterization in 01/2014, persistent atrial fibrillation, diastolic CHF, HTN, HL, anemia, and CKD. Patient was admitted 04/2014 x2 with acute on chronic diastolic CHF. She underwent cardioversion with restoration of NSR. This resulted in profound bradycardia and hypotension requiring pacemaker implantation. Pacemaker implant was complicated by acute blood loss anemia requiring transfusion with PRBCs  Admitted to [facility] August 21 with increased dyspnea. Diuresed with IV lasix and transitioned to po lasix 40 mg twice a day. GI evaluated due to anemia and black stool. Had EGD with no acute findings. Colonoscopy was cancelled per daughter due to multiple medical problems. She continued on eliquis 2.5 mg twice a day.  Palliative Care consulted and the patient and family request aggressive care for heart failure. Discharge weight was 152 pounds.   Admitted to [facility] 11/9 after RHC due to elevated filling pressures. Once diuresed she was put on torsemide 20 mg twice a day. She was also restarted on amiodarone 200 mg daily and continue on eliquis 2.5 mg twice a day. Discharge weight was 141 pounds.   Admitted 12/12 to 12/17 with recurrent HF and AF. Admit weight was 136. Was diuresed to 131. Diuresis limited by low BP. Demadex increased from 20 bid to 40 bid.   Admitted 12/06/14 - 12/09/14 for ADHF. Initial weight 134 pounds. Diuresed down to 130 and felt better. Treatment limited due to hypotension and worsening renal failure with creatinine up to 2.6. Renal u/s ok. Metolazone stopped.  Admitted in 3/16 with hypotension. Started on midodrine 2.5 bid. Demadex cut back from 40 bid to 40 daily.   Admitted 5/17 - 5/18 with  recurrent CP. Myoview normal EF > 65% no ischemia.  Admitted 05/09/15 for chest pain. Demedex decreased to 30 mg daily with worsening creatinine. Troponin flat and without trend. D/c weight 133 lb  Admitted 01/21/16 with atypical chest pain. Recent cath, stress test reviewed with her and re-inforced that it is not cardiac related. Ruled out ACS, esophageal dysmotility, normal esophagogram.  Echo 01/22/16 LVEF 60-65%, grade 2 DD  Was in the ER on 4/23 for orthopnea. CXR with mild congestion. BNP up to 420 (baseline around 200). Was discharged home.   She returns today for HF follow up. Overall doing well. Was seen in the ER 2 weeks ago for leg swelling related to her varicose veins. Still with mild dyspnea. Weight at home actually down a few pounds in the setting of diarrhea yesterday. Occasionally dizzy when she walks around the house. No presyncope. No orthopnea or PND. Takes extra torsemide as needed. Taking all medications as directed.  No bleeding on eliquis. Worries her pacemaker has slipped down.    Studies:  - LHC (3/15): Mid LM 20-30%, mid RCA 30-40%, EF 65%  - Echo (4/15): Mild LVH, EF 60-65%, no RWMA, mild BAE, trivial eff  - Echo 5/15: EF 45-50% septl HK - Carotid US (5/15): 40-59% RICA stenosis. [id number] LICA stenosis.  - 5/16 Myoview EF > 65% normal perfusion - Cath 3/15:   Left mainstem: The mid left main has 20-30% stenosis and there is moderate calcification noted. Left anterior descending (LAD): There is mild diffuse plaquing without significant stenosis. The first diagonal is patent. Left  circumflex (LCx): Left circumflex is patent. The first OM is patent without significant stenosis. Right coronary artery (RCA): This is a large, dominant vessel. The vessel is moderately calcified. The mid vessel has 30-40% stenosis.   RHC 09/22/14  RA = 11 RV = 49/6/10 PA = 49/22 (34) PCW = 18 (v = 25) Fick cardiac output/index = 6.4/4.0 PVR = 2.5 WU FA sat = 95% PA sat = 72%,74%  Labs  9/15 K 3.5 Creatinine 1.44 Hgb 10.7  Labs 8/15 K 4.1 Creatinine 1.46  Hgb 9.8  Labs 8/15 TSH 2.7 Labs 10/15: K 4.0, creatinine 1.93 Labs 11/15 K 4.6 Creatinine 3.9  Labs 11/15 K 3.6 Creatinine 1.61 hgb 13.7 Labs 11/15 K 4.4 Creatinine 1.7 Hgb 14/1  Labs 12/15 K 3.9 Creatinine 1.4  Labs 2/16: K 4.2 Creatinine 2.22 Labs 5/16: K 3.5 Creatinine 1.8 Labs 6/16 K 3.1 Creatinine 2.29   ROS: All systems negative except as listed in HPI, PMH and Problem List.  Past Medical History:  Diagnosis Date  . Anemia   . Atrial fibrillation ([place])    a. Dx 01/2014->Eliquis started.  . Cancer ([place])   . Chest pain    a. 2011 Neg MV;  b. 01/2014 Cath: LM 20-30, LAD nl, D1 nl, LCX nl, OM1 nl, RCA dom 30-59m, PD/PL nl, EF 65%->Med Rx; c. 03/2016 MV: no ischemia/infarct. EF 63%.  . Degenerative joint disease   . Diastolic CHF, acute on chronic ([place]) 01/31/2012  . Dyslipidemia   . Dysrhythmia   . Family history of adverse reaction to anesthesia    " multiple family members have difficulty waking "  . GERD (gastroesophageal reflux disease)   . History of skin cancer    tafeen/ non melanoma.   Marland Kitchen Hx of varicella   . Hypertension   . Hypothyroidism   . Monoclonal gammopathies   . Pacemaker   . Palpitations    PVCs/bigeminy on event in May 2009 revealing this was relatively asymptomatic  . Right lower quadrant abdominal pain 07/19/2012   Recurrent  With nausea    This is new since she had ct for llq pain in [facility]    R/o appendiceal problem  Hernia  Get ct scan  And plan  Fu     . Thrombocytopenia ([place])     Current Outpatient Prescriptions  Medication Sig Dispense Refill  . acetaminophen (TYLENOL) 325 MG tablet Take 2 tablets (650 mg total) by mouth every 4 (four) hours as needed for headache or mild pain.    Marland Kitchen amiodarone (PACERONE) 200 MG tablet TAKE ONE TABLET BY MOUTH ONCE DAILY 30 tablet 4  . denosumab (PROLIA) 60 MG/ML SOLN injection Inject 60 mg into the skin every 6 (six) months. Administer in upper  arm, thigh, or abdomen    . ELIQUIS 2.5 MG TABS tablet TAKE ONE TABLET BY MOUTH TWICE DAILY 60 tablet 6  . erythromycin ophthalmic ointment Place 1 application into both eyes 2 (two) times daily. 3.5 g 1  . Ferrous Sulfate (IRON) 325 (65 Fe) MG TABS Take 1 tablet by mouth daily. 180 each 0  . levothyroxine (SYNTHROID, LEVOTHROID) 75 MCG tablet TAKE ONE TABLET BY MOUTH ONCE DAILY 90 tablet 3  . loratadine (CLARITIN) 10 MG tablet Take 10 mg by mouth at bedtime as needed for allergies or rhinitis.     Marland Kitchen meclizine (ANTIVERT) 12.5 MG tablet Take 1 tablet (12.5 mg total) by mouth 2 (two) times daily as needed for dizziness. 10 tablet 0  .  oxyCODONE-acetaminophen (PERCOCET/ROXICET) 5-325 MG tablet Take 1 tablet by mouth daily as needed for pain.  0  . pantoprazole (PROTONIX) 40 MG tablet Take 40 mg by mouth daily.    . potassium chloride SA (K-DUR,KLOR-CON) 20 MEQ tablet Take 3 tablets (60 mEq total) by mouth daily. Take an additional 37meq with Metolazone. 90 tablet 2  . simvastatin (ZOCOR) 10 MG tablet TAKE ONE TABLET BY MOUTH ONCE DAILY AT BEDTIME 30 tablet 3  . torsemide (DEMADEX) 20 MG tablet Take 2 tablets (40 mg total) by mouth daily. 60 tablet 6  . metolazone (ZAROXOLYN) 2.5 MG tablet Take 1 tablet (2.5 mg total) by mouth as needed (for weight 134 lb or greater). (Patient not taking: Reported on 10/03/2016) 10 tablet 3  . nitroGLYCERIN (NITROSTAT) 0.4 MG SL tablet Place 1 tablet (0.4 mg total) under the tongue every 5 (five) minutes as needed. For chest pain (Patient not taking: Reported on 10/03/2016) 25 tablet 6   No current facility-administered medications for this encounter.    Vitals:   10/03/16 1101  BP: 122/66  Pulse: 70  SpO2: 94%  Weight: 130 lb 6 oz (59.1 kg)    Wt Readings from Last 3 Encounters:  10/03/16 130 lb 6 oz (59.1 kg)  08/19/16 136 lb (61.7 kg)  08/17/16 136 lb 6.4 oz (61.9 kg)     PHYSICAL EXAM: General:  Elderly, frail appearing. No resp difficulty. Caregiver  present.  HEENT: Normal Neck: supple. JVP flat. Carotids 2+ bilaterally; no bruits. No thyromegaly or nodule noted. Cor: PMI normal. RRR. No rubs, gallops. 1/6 diastolic murmur. Pacer site ok.  Lungs: CTAB, normal effort Abdomen: soft, NT, ND, no HSM. No bruits or masses. +BS  Extremities: no cyanosis, clubbing, rash, R and LLE severe varicose veins. No edema noted. Neuro: alert & orientedx3, cranial nerves grossly intact. Moves all 4 extremities w/o difficulty. Affect pleasant.   ASSESSMENT & PLAN:  1. Chronic Diastolic Heart Failure - Chronic NYHA II-III.  - Volume status stable. Continue 40 mg torsemide daily.   - Continue to take metolazone (and KCL 20) only as needed for weight 134 or greater.  - Reinforced the need and importance of daily weights, a low sodium diet, and fluid restriction (less than 2 L a day). Instructed to call the HF clinic if weight increases more than 3 lbs overnight or 5 lbs in a week.  - Check labs today 2.PAF - symptomatic bradycardia s/p PPM.   - Previously thought to have failed amiodarone and had chronic AF. However in November in the hospital she was in NSR so amio restarted.  - She remains in NSR by exam today - Continue amiodarone 200 mg daily. Continue Eliquis 2.5 mg twice a day. No bleeding problems.    - Pacer site ok 3. CKD stage IV - check labs today 4. Deconditioning- Continue to increase activity as able.  5. Chest pain, atypical  - resolved. Stress test normal 5/16. Minimal CAD on cath 3/15 - had recent full GI work up as well, normal.    Follow up in 4 months.   Glori Bickers MD 11:16 AM

## 2016-10-04 ENCOUNTER — Telehealth (HOSPITAL_COMMUNITY): Payer: Self-pay | Admitting: *Deleted

## 2016-10-04 MED ORDER — POTASSIUM CHLORIDE CRYS ER 20 MEQ PO TBCR: 40.0000 meq | EXTENDED_RELEASE_TABLET | Freq: Every day | ORAL | 2 refills | Status: DC

## 2016-10-04 NOTE — Telephone Encounter (Signed)
Notes Recorded by Harvie Junior, CMA on 10/04/2016 at 3:04 PM EST Patient aware. Medication updated in patients chart.   ------  Notes Recorded by Harvie Junior, [place] on 10/03/2016 at 4:08 PM EST Left message for patient to call back.  ------  Notes Recorded by Jolaine Artist, MD on 10/03/2016 at 3:08 PM EST Stable. Cut potassium to 40 daily.    Ref Range & Units 1d ago 74mo ago 12mo ago   Sodium 135 - 145 mmol/L 140  141  141    Potassium 3.5 - 5.1 mmol/L 4.8  4.7CM  4.1    Chloride 101 - 111 mmol/L 107  105  107    CO2 22 - 32 mmol/L 24  26  26     Glucose, Bld 65 - 99 mg/dL 97  100   128     BUN 6 - 20 mg/dL 37   28   36     Creatinine, Ser 0.44 - 1.00 mg/dL 2.19   2.26   2.07     Calcium 8.9 - 10.3 mg/dL 9.6  9.1  9.2    GFR calc non Af Amer >60 mL/min 19   18   20      GFR calc Af Amer >60 mL/min 22   21CM   23CM    Comments: (NOTE)

## 2016-10-15 ENCOUNTER — Encounter (HOSPITAL_COMMUNITY): Payer: Self-pay | Admitting: *Deleted

## 2016-10-15 ENCOUNTER — Other Ambulatory Visit (HOSPITAL_COMMUNITY): Payer: Self-pay | Admitting: Internal Medicine

## 2016-10-15 ENCOUNTER — Other Ambulatory Visit: Payer: Self-pay | Admitting: Internal Medicine

## 2016-10-15 ENCOUNTER — Emergency Department (HOSPITAL_COMMUNITY): Payer: Medicare Other

## 2016-10-15 ENCOUNTER — Emergency Department (HOSPITAL_COMMUNITY)
Admission: EM | Admit: 2016-10-15 | Discharge: 2016-10-15 | Disposition: A | Discharge status: Home / Self Care | Payer: Medicare Other | Attending: Emergency Medicine | Admitting: Emergency Medicine

## 2016-10-15 DIAGNOSIS — Z7901 Long term (current) use of anticoagulants: Secondary | ICD-10-CM | POA: Insufficient documentation

## 2016-10-15 DIAGNOSIS — I5042 Chronic combined systolic (congestive) and diastolic (congestive) heart failure: Secondary | ICD-10-CM | POA: Diagnosis not present

## 2016-10-15 DIAGNOSIS — R0789 Other chest pain: Secondary | ICD-10-CM | POA: Diagnosis not present

## 2016-10-15 DIAGNOSIS — I130 Hypertensive heart and chronic kidney disease with heart failure and stage 1 through stage 4 chronic kidney disease, or unspecified chronic kidney disease: Secondary | ICD-10-CM | POA: Insufficient documentation

## 2016-10-15 DIAGNOSIS — Z950 Presence of cardiac pacemaker: Secondary | ICD-10-CM | POA: Insufficient documentation

## 2016-10-15 DIAGNOSIS — E039 Hypothyroidism, unspecified: Secondary | ICD-10-CM | POA: Insufficient documentation

## 2016-10-15 DIAGNOSIS — N184 Chronic kidney disease, stage 4 (severe): Secondary | ICD-10-CM | POA: Diagnosis not present

## 2016-10-15 DIAGNOSIS — Z79899 Other long term (current) drug therapy: Secondary | ICD-10-CM | POA: Diagnosis not present

## 2016-10-15 DIAGNOSIS — Z859 Personal history of malignant neoplasm, unspecified: Secondary | ICD-10-CM | POA: Diagnosis not present

## 2016-10-15 DIAGNOSIS — R079 Chest pain, unspecified: Secondary | ICD-10-CM | POA: Diagnosis present

## 2016-10-15 LAB — BASIC METABOLIC PANEL
ANION GAP: 7 (ref 5–15)
BUN: 38 mg/dL — ABNORMAL HIGH (ref 6–20)
CALCIUM: 9.6 mg/dL (ref 8.9–10.3)
CO2: 27 mmol/L (ref 22–32)
Chloride: 107 mmol/L (ref 101–111)
Creatinine, Ser: 2.2 mg/dL — ABNORMAL HIGH (ref 0.44–1.00)
GFR, EST AFRICAN AMERICAN: 22 mL/min — AB (ref >60)
GFR, EST NON AFRICAN AMERICAN: 19 mL/min — AB (ref >60)
Glucose, Bld: 103 mg/dL — ABNORMAL HIGH (ref 65–99)
POTASSIUM: 4.6 mmol/L (ref 3.5–5.1)
Sodium: 141 mmol/L (ref 135–145)

## 2016-10-15 LAB — CBC WITH DIFFERENTIAL/PLATELET
BASOS ABS: 0 10*3/uL (ref 0.0–0.1)
BASOS PCT: 1 %
EOS PCT: 2 %
Eosinophils Absolute: 0.1 10*3/uL (ref 0.0–0.7)
HCT: 37.3 % (ref 36.0–46.0)
Hemoglobin: 12.1 g/dL (ref 12.0–15.0)
LYMPHS PCT: 16 %
Lymphs Abs: 0.7 10*3/uL (ref 0.7–4.0)
MCH: 30.8 pg (ref 26.0–34.0)
MCHC: 32.4 g/dL (ref 30.0–36.0)
MCV: 94.9 fL (ref 78.0–100.0)
MONO ABS: 0.5 10*3/uL (ref 0.1–1.0)
Monocytes Relative: 11 %
NEUTROS ABS: 3.1 10*3/uL (ref 1.7–7.7)
Neutrophils Relative %: 70 %
PLATELETS: 109 10*3/uL — AB (ref 150–400)
RBC: 3.93 MIL/uL (ref 3.87–5.11)
RDW: 14.3 % (ref 11.5–15.5)
WBC: 4.4 10*3/uL (ref 4.0–10.5)

## 2016-10-15 LAB — TROPONIN I
TROPONIN I: 0.03 ng/mL — AB (ref <0.03)
Troponin I: 0.04 ng/mL (ref <0.03)

## 2016-10-15 MED ORDER — TORSEMIDE 20 MG PO TABS
40.0000 mg | ORAL_TABLET | Freq: Every day | ORAL | Status: DC
  Administered 2016-10-15: 40 mg via ORAL
  Filled 2016-10-15: qty 2

## 2016-10-15 NOTE — ED Provider Notes (Addendum)
Medical screening examination/treatment/procedure(s) were conducted as a shared visit with non-physician practitioner(s) and myself.  I personally evaluated the patient during the encounter.   EKG Interpretation None      ED ECG REPORT   Date: 10/15/2016  Rate: 70  Rhythm: normal sinus rhythm  QRS Axis: normal  Intervals: normal  ST/T Wave abnormalities: nonspecific ST/T changes  Conduction Disutrbances:left bundle branch block  Narrative Interpretation:   Old EKG Reviewed: none available  I have personally reviewed the EKG tracing and agree with the computerized printout as noted.   Medical screening examination/treatment/procedure(s) were conducted as a shared visit with non-physician practitioner(s) and myself.  I personally evaluated the patient during the encounter.   EKG Interpretation None      Results for orders placed or performed during the hospital encounter of 10/15/16  CBC with Differential/Platelet  Result Value Ref Range   WBC 4.4 4.0 - 10.5 K/uL   RBC 3.93 3.87 - 5.11 MIL/uL   Hemoglobin 12.1 12.0 - 15.0 g/dL   HCT 37.3 36.0 - 46.0 %   MCV 94.9 78.0 - 100.0 fL   MCH 30.8 26.0 - 34.0 pg   MCHC 32.4 30.0 - 36.0 g/dL   RDW 14.3 11.5 - 15.5 %   Platelets 109 (L) 150 - 400 K/uL   Neutrophils Relative % 70 %   Neutro Abs 3.1 1.7 - 7.7 K/uL   Lymphocytes Relative 16 %   Lymphs Abs 0.7 0.7 - 4.0 K/uL   Monocytes Relative 11 %   Monocytes Absolute 0.5 0.1 - 1.0 K/uL   Eosinophils Relative 2 %   Eosinophils Absolute 0.1 0.0 - 0.7 K/uL   Basophils Relative 1 %   Basophils Absolute 0.0 0.0 - 0.1 K/uL  Basic metabolic panel  Result Value Ref Range   Sodium 141 135 - 145 mmol/L   Potassium 4.6 3.5 - 5.1 mmol/L   Chloride 107 101 - 111 mmol/L   CO2 27 22 - 32 mmol/L   Glucose, Bld 103 (H) 65 - 99 mg/dL   BUN 38 (H) 6 - 20 mg/dL   Creatinine, Ser 2.20 (H) 0.44 - 1.00 mg/dL   Calcium 9.6 8.9 - 10.3 mg/dL   GFR calc non Af Amer 19 (L) >60 mL/min   GFR calc  Af Amer 22 (L) >60 mL/min   Anion gap 7 5 - 15  Troponin I  Result Value Ref Range   Troponin I 0.03 (HH) <0.03 ng/mL   Dg Chest Portable 1 View  Result Date: 10/15/2016 CLINICAL DATA:  Chest pain. EXAM: PORTABLE CHEST 1 VIEW COMPARISON:  08/01/2016 FINDINGS: Stable appearance of the right dual chamber cardiac pacemaker. Increased interstitial densities in both lungs are suggestive for pulmonary edema. Heart size is within normal limits. Atherosclerotic calcifications at the aortic arch. No large pleural effusions. Negative for a pneumothorax. IMPRESSION: Evidence for interstitial pulmonary edema. Aortic atherosclerosis. Electronically Signed   By: Markus Daft M.D.   On: 10/15/2016 07:38    Patient seen by me along with physician assistant. Patient with known history of CHF. Recently seen by cardiology on November 20. Patient with onset of chest pain during the night. Took nitroglycerin with 3 in the morning without any change in the chest pain. Also associated with shortness of breath. Approximately 1 hour ago patient's symptoms resolved. No further shortness of breath no chest pain. Patient's initial troponin slightly elevated at 0.03. Patient not known to have any significant coronary artery disease. Did have cardiac cath in  2015.  Repeat troponin would be important.  Chest x-ray raises concerns for some pulmonary edema. This would be consistent with patient's past CHF problems. Patient did not have any of her morning meds. Patient given her diuretic. This was given orally.  Consult to the cardiology they will see her in consultation.  Patient's lungs are clear bilaterally. Currently no acute distress. Alert and oriented. Abdomen soft nontender.  EKG did not crossover      Fredia Sorrow, MD 10/15/16 1059    Fredia Sorrow, MD 10/15/16 1101

## 2016-10-15 NOTE — Discharge Instructions (Signed)

## 2016-10-15 NOTE — ED Provider Notes (Signed)
[place] DEPT Provider Note   CSN: [id number] Arrival date & time: 10/15/16  [id number]     History   Chief Complaint Chief Complaint  Patient presents with  . Chest Pain    HPI [name] is a 82 y.o. female with a PMH of Afib on Eliquis, Nonobstructive CAD, St Jude PPM, Diastolic CHF, HTN, HLD, Anemia, CKD. She presents w/cc of cp and cough. Patient states at 1:00 AM she awoke from sleep  with what she describes as "searing chest pain" and BL arm and hand paresthesia. She had a dry cough as well . She has a history of reflux and takes Protonix but denies any reflux symptoms. She states she tried to go back to sleep. However, she did not improve and around 3 AM she took a nitroglycerin without any relief of her symptoms. The patient arrived in the emergency department. She currently rates her pain 3 out of 10. She denies associated shortness of breath. She states she wears an oxygen and heart rate monitor at night and her O2 sat was 98% with a heart rate of 77. She states that she continues to have some numbness in her hands but none in her arms. She denies a history of carpal tunnel syndrome. She denies weight gain or ankle swelling. She states that she has a nurse who monitors her daily and a scale that reports into that nurse. If her weight is at the nurse will call her to tell her to take extra medicine. She denies exertional dyspnea. She has a history of anemia and takes daily. Iron. She is also on eloquent is. She states she has had some dark stools but is unsure if they are melanotic. Marland Kitchen She denies unilateral leg swelling, recent fever her, productive cough, symptoms of URI, myalgias.  HPI  Past Medical History:  Diagnosis Date  . Anemia   . Atrial fibrillation ([place])    a. Dx 01/2014->Eliquis started.  . Cancer ([place])   . Chest pain    a. 2011 Neg MV;  b. 01/2014 Cath: LM 20-30, LAD nl, D1 nl, LCX nl, OM1 nl, RCA dom 30-5m, PD/PL nl, EF 65%->Med Rx; c. 03/2016 MV: no  ischemia/infarct. EF 63%.  . Degenerative joint disease   . Diastolic CHF, acute on chronic ([place]) 01/31/2012  . Dyslipidemia   . Dysrhythmia   . Family history of adverse reaction to anesthesia    " multiple family members have difficulty waking "  . GERD (gastroesophageal reflux disease)   . History of skin cancer    tafeen/ non melanoma.   Marland Kitchen Hx of varicella   . Hypertension   . Hypothyroidism   . Monoclonal gammopathies   . Pacemaker   . Palpitations    PVCs/bigeminy on event in May 2009 revealing this was relatively asymptomatic  . Right lower quadrant abdominal pain 07/19/2012   Recurrent  With nausea    This is new since she had ct for llq pain in [facility]    R/o appendiceal problem  Hernia  Get ct scan  And plan  Fu     . Thrombocytopenia [facility])     Patient Active Problem List   Diagnosis Date Noted  . Hypertensive heart disease 04/01/2016  . PAF (paroxysmal atrial fibrillation) ([place]) 03/31/2016  . Chest pain with moderate risk for cardiac etiology, negative MI and Nuc study, + muscular skeletal   . Atypical chest pain 01/21/2016  . CKD (chronic kidney disease) stage 4, GFR 15-29 ml/min (HCC)  05/09/2015  . Anemia of chronic disease 05/09/2015  . Dyslipidemia 05/09/2015  . Iron deficiency anemia 11/21/2014  . Pacemaker 04/27/2014  . Chronic anticoagulation 02/25/2014  . Chronic combined systolic and diastolic CHF (congestive heart failure) ([place]) 02/24/2014  . Atrial fibrillation ([place]) 01/29/2014  . MGUS (monoclonal gammopathy of unknown significance) 08/01/2013  . Hypothyroidism 02/19/2012  . Hyperlipidemia 12/15/2008  . HYPERTENSION, BENIGN 12/15/2008    Past Surgical History:  Procedure Laterality Date  . CARDIOVERSION N/A 02/26/2014   Procedure: CARDIOVERSION AT BEDSIDE;  Surgeon: Pixie Casino, MD;  Location: [facility];  Service: Cardiovascular;  Laterality: N/A;  . CARDIOVERSION N/A 04/22/2014   Procedure: CARDIOVERSION (BEDSIDE);  Surgeon: [name], MD;  Location:  [facility] OR;  Service: Cardiovascular;  Laterality: N/A;  . CATARACT EXTRACTION     Bilateral  implantt  . CESAREAN SECTION     times 2  . CHOLECYSTECTOMY  1999  . ESOPHAGOGASTRODUODENOSCOPY N/A 07/06/2014   Procedure: ESOPHAGOGASTRODUODENOSCOPY (EGD);  Surgeon: Ladene Artist, MD;  Location: [facility] ENDOSCOPY;  Service: Endoscopy;  Laterality: N/A;  . INSERT / REPLACE / REMOVE PACEMAKER    . LEFT HEART CATHETERIZATION WITH CORONARY ANGIOGRAM N/A 01/29/2014   Procedure: LEFT HEART CATHETERIZATION WITH CORONARY ANGIOGRAM;  Surgeon: Blane Ohara, MD;  Location: [facility] CATH LAB;  Service: Cardiovascular;  Laterality: N/A;  . PACEMAKER INSERTION  04/23/2014   STJ Assurity dual chamber pacemaker implanted by Dr Rayann Heman  . PERMANENT PACEMAKER INSERTION N/A 04/23/2014   Procedure: PERMANENT PACEMAKER INSERTION;  Surgeon: Evans Lance, MD;  Location: [facility] CATH LAB;  Service: Cardiovascular;  Laterality: N/A;  . RIGHT HEART CATHETERIZATION N/A 09/22/2014   Procedure: RIGHT HEART CATH;  Surgeon: Jolaine Artist, MD;  Location: [facility] CATH LAB;  Service: Cardiovascular;  Laterality: N/A;  . SHOULDER SURGERY  1996   Right     OB History    No data available       Home Medications    Prior to Admission medications   Medication Sig Start Date End Date Taking? Authorizing Provider  acetaminophen (TYLENOL) 325 MG tablet Take 2 tablets (650 mg total) by mouth every 4 (four) hours as needed for headache or mild pain. 09/26/14   Isaiah Serge, NP  amiodarone (PACERONE) 200 MG tablet TAKE ONE TABLET BY MOUTH ONCE DAILY 05/23/16   Evans Lance, MD  denosumab (PROLIA) 60 MG/ML SOLN injection Inject 60 mg into the skin every 6 (six) months. Administer in upper arm, thigh, or abdomen    Historical Provider, MD  ELIQUIS 2.5 MG TABS tablet TAKE ONE TABLET BY MOUTH TWICE DAILY 03/14/16   Jolaine Artist, MD  erythromycin ophthalmic ointment Place 1 application into both eyes 2 (two) times daily. 07/19/16   Burnis Medin,  MD  Ferrous Sulfate (IRON) 325 (65 Fe) MG TABS Take 1 tablet by mouth daily. 07/01/16   Clanford Marisa Hua, MD  levothyroxine (SYNTHROID, LEVOTHROID) 75 MCG tablet TAKE ONE TABLET BY MOUTH ONCE DAILY 02/29/16   Burnis Medin, MD  loratadine (CLARITIN) 10 MG tablet Take 10 mg by mouth at bedtime as needed for allergies or rhinitis.     Historical Provider, MD  meclizine (ANTIVERT) 12.5 MG tablet Take 1 tablet (12.5 mg total) by mouth 2 (two) times daily as needed for dizziness. 09/14/16   Jolaine Artist, MD  metolazone (ZAROXOLYN) 2.5 MG tablet Take 1 tablet (2.5 mg total) by mouth as needed (for weight 134 lb or greater). Patient not  taking: Reported on 10/03/2016 03/09/16   Jolaine Artist, MD  nitroGLYCERIN (NITROSTAT) 0.4 MG SL tablet Place 1 tablet (0.4 mg total) under the tongue every 5 (five) minutes as needed. For chest pain Patient not taking: Reported on 10/03/2016 05/06/16   Jolaine Artist, MD  oxyCODONE-acetaminophen (PERCOCET/ROXICET) 5-325 MG tablet Take 1 tablet by mouth daily as needed for pain. 06/09/16   Historical Provider, MD  pantoprazole (PROTONIX) 40 MG tablet Take 40 mg by mouth daily.    Historical Provider, MD  potassium chloride SA (K-DUR,KLOR-CON) 20 MEQ tablet Take 2 tablets (40 mEq total) by mouth daily. Take an additional 39meq with Metolazone. 10/04/16   Jolaine Artist, MD  simvastatin (ZOCOR) 10 MG tablet TAKE ONE TABLET BY MOUTH ONCE DAILY AT BEDTIME 09/30/16   Jolaine Artist, MD  torsemide (DEMADEX) 20 MG tablet Take 2 tablets (40 mg total) by mouth daily. 04/03/16   Isaiah Serge, NP    Family History Family History  Problem Relation Age of Onset  . Coronary artery disease Father   . Heart disease Father   . Heart attack Father   . Alzheimer's disease Mother   . Cancer Brother 97    lung cancer  . Lung cancer      Social History Social History  Substance Use Topics  . Smoking status: Never Smoker  . Smokeless tobacco: Never Used  .  Alcohol use No     Allergies   Other; Tramadol; and Ibuprofen   Review of Systems Review of Systems  Ten systems reviewed and are negative for acute change, except as noted in the HPI.    Physical Exam Updated Vital Signs BP 115/57 (BP Location: Right Arm)   Pulse 70   Temp 98.2 F (36.8 C) (Oral)   Resp 15   Ht 4\' 10"  (1.473 m)   Wt 59.4 kg   SpO2 98%   BMI 27.38 kg/m   Physical Exam  Constitutional: She is oriented to person, place, and time. She appears well-developed and well-nourished. No distress.  HENT:  Head: Normocephalic and atraumatic.  Eyes: Conjunctivae are normal. No scleral icterus.  Neck: Normal range of motion.  Cardiovascular: Normal rate, regular rhythm and normal heart sounds.  Exam reveals no gallop and no friction rub.   No murmur heard. Pulmonary/Chest: Effort normal and breath sounds normal. No respiratory distress. She exhibits no tenderness.  Abdominal: Soft. Bowel sounds are normal. She exhibits no distension and no mass. There is no tenderness. There is no guarding.  Neurological: She is alert and oriented to person, place, and time.  Skin: Skin is warm and dry. She is not diaphoretic.     Previous studies: -LHC (3/15): Mid LM 20-30%, mid RCA 30-40%, EF 65%  - Echo (4/15): Mild LVH, EF 60-65%, no RWMA, mild BAE, trivial eff  - Echo 5/15: EF 45-50% septl HK - Carotid US (5/15): 40-59% RICA stenosis. [id number] LICA stenosis.  - 5/16 Myoview EF > 65% normal perfusion - Cath 3/15:   Left mainstem: The mid left main has 20-30% stenosis and there is moderate calcification noted. Left anterior descending (LAD): There is mild diffuse plaquing without significant stenosis. The first diagonal is patent. Left circumflex (LCx): Left circumflex is patent. The first OM is patent without significant stenosis. Right coronary artery (RCA): This is a large, dominant vessel. The vessel is moderately calcified. The mid vessel has 30-40% stenosis  ED  Treatments / Results  Labs (all labs ordered are listed,  but only abnormal results are displayed) Labs Reviewed  CBC WITH DIFFERENTIAL/PLATELET  BASIC METABOLIC PANEL  TROPONIN I     *MUSE IS NOT WORKING FOR EKG INPUT* EKG  EKG Interpretation None      ED ECG REPORT   Date: 10/15/2016  Rate: 70  Rhythm: normal sinus rhythm  QRS Axis: normal  Intervals: normal  ST/T Wave abnormalities: normal  Conduction Disutrbances:left bundle branch block  Narrative Interpretation:   Old EKG Reviewed: unchanged  I have personally reviewed the EKG tracing and agree with the computerized printout as noted.  Radiology Dg Chest Portable 1 View  Result Date: 10/15/2016 CLINICAL DATA:  Chest pain. EXAM: PORTABLE CHEST 1 VIEW COMPARISON:  08/01/2016 FINDINGS: Stable appearance of the right dual chamber cardiac pacemaker. Increased interstitial densities in both lungs are suggestive for pulmonary edema. Heart size is within normal limits. Atherosclerotic calcifications at the aortic arch. No large pleural effusions. Negative for a pneumothorax. IMPRESSION: Evidence for interstitial pulmonary edema. Aortic atherosclerosis. Electronically Signed   By: Markus Daft M.D.   On: 10/15/2016 07:38    Procedures Procedures (including critical care time)  Medications Ordered in ED Medications - No data to display   Initial Impression / Assessment and Plan / ED Course  I have reviewed the triage vital signs and the nursing notes.  Pertinent labs & imaging results that were available during my care of the patient were reviewed by me and considered in my medical decision making (see chart for details).  Clinical Course as of Oct 16 1515  Sat Oct 15, 2016  0907 Troponin elevated, however review of her previous troponins shows serial elevation. Troponin I: (!!) 0.03 [AH]  0910 Creatinine is at baseline Creatinine: (!) 2.20 [AH]  1040 Repeat troponin pendng and cardiology consult pending.  [AH]  1226  Patient troponin slightly increased, again still withint the patient's normal range.  Still awaiting cardiology consult She remains CP free Troponin I: (!!) 0.04 [AH]    Clinical Course User Index [AH] Margarita Mail, PA-C     Previous troponins Troponin I 1 hour ago  Troponin I-serum... 3 months ago Troponin I-serum... 3 months ago Troponin I-serum... 3 months ago   Troponin I <0.03 ng/mL Comment: CRITICAL RESULT CALLED TO, READ BACK BY AND VERIFIED WITH: RN H BOWMAN AT [phone] [id number] MARTINB " class="DataCell AbnormalDataCell">0.03  <0.03 ng/mL Comment: CRITICAL VALUE NOTED. VALUE IS CONSISTENT WITH PREVIOUSLY REPORTED AND CALLED VALUE." class="DataCell AbnormalDataCell" style="border-left-color: grey; border-left-width: 1px; border-left-style: solid;">0.03 <0.03 ng/mL Comment: CRITICAL VALUE NOTED. VALUE IS CONSISTENT WITH PREVIOUSLY REPORTED AND CALLED VALUE." class="DataCell AbnormalDataCell">0.04 <0.03 ng/mL Comment: CRITICAL RESULT CALLED TO, READ BACK BY AND VERIFIED WITH: FUTRELL M,RN 07/01/16 0223 [facility] " class="DataCell AbnormalDataCell">0.03     Patient seen in the emergency department by cardiologist, Dr. Lovena Le, who is familiar with the patient. Patient able to ambulate around the emergency room without syncope, shortness of breath, chest pain, nausea, or other anginal equivalents. She has scheduled follow-up appointment early next week with Dr. Lovena Le. I discussed return precautions. The patient appears safe for discharge at this time.  Final Clinical Impressions(s) / ED Diagnoses   Final diagnoses:  Atypical chest pain    New Prescriptions New Prescriptions   No medications on file     Margarita Mail, PA-C 10/16/16 [place], MD 10/19/16 [phone]

## 2016-10-15 NOTE — Consult Note (Signed)
Reason for Consult:non-cardiac chest pain and sob  Referring Physician: ER MD  [name] is an 82 y.o. female.   HPI: The patient is an 82 yo woman well known to me from a prior PPM placed due to bradycardia. She has a h/o chronic chest pain. She has non-obstructive CAD. She has a h/o diastolic heart failure. She was in her usual state of health when she awoke coughing and noted chest pain and sob. She came to the ER and her pain resolved within an hour. Serial enzymes have been negative for ischemia/injury. She has been in NSR with ventricular pacing. She has not felt palpitations. She wants to go home as she is now pain free. She denies fever or chills.   PMH: Past Medical History:  Diagnosis Date  . Anemia   . Atrial fibrillation ([place])    a. Dx 01/2014->Eliquis started.  . Cancer ([place])   . Chest pain    a. 2011 Neg MV;  b. 01/2014 Cath: LM 20-30, LAD nl, D1 nl, LCX nl, OM1 nl, RCA dom 30-67m, PD/PL nl, EF 65%->Med Rx; c. 03/2016 MV: no ischemia/infarct. EF 63%.  . Degenerative joint disease   . Diastolic CHF, acute on chronic ([place]) 01/31/2012  . Dyslipidemia   . Dysrhythmia   . Family history of adverse reaction to anesthesia    " multiple family members have difficulty waking "  . GERD (gastroesophageal reflux disease)   . History of skin cancer    tafeen/ non melanoma.   Marland Kitchen Hx of varicella   . Hypertension   . Hypothyroidism   . Monoclonal gammopathies   . Pacemaker   . Palpitations    PVCs/bigeminy on event in May 2009 revealing this was relatively asymptomatic  . Right lower quadrant abdominal pain 07/19/2012   Recurrent  With nausea    This is new since she had ct for llq pain in [facility]    R/o appendiceal problem  Hernia  Get ct scan  And plan  Fu     . Thrombocytopenia ([place])     PSHX: Past Surgical History:  Procedure Laterality Date  . CARDIOVERSION N/A 02/26/2014   Procedure: CARDIOVERSION AT BEDSIDE;  Surgeon: Pixie Casino, MD;  Location: [facility];  Service:  Cardiovascular;  Laterality: N/A;  . CARDIOVERSION N/A 04/22/2014   Procedure: CARDIOVERSION (BEDSIDE);  Surgeon: [name], MD;  Location: [facility] OR;  Service: Cardiovascular;  Laterality: N/A;  . CATARACT EXTRACTION     Bilateral  implantt  . CESAREAN SECTION     times 2  . CHOLECYSTECTOMY  1999  . ESOPHAGOGASTRODUODENOSCOPY N/A 07/06/2014   Procedure: ESOPHAGOGASTRODUODENOSCOPY (EGD);  Surgeon: Ladene Artist, MD;  Location: [facility] ENDOSCOPY;  Service: Endoscopy;  Laterality: N/A;  . INSERT / REPLACE / REMOVE PACEMAKER    . LEFT HEART CATHETERIZATION WITH CORONARY ANGIOGRAM N/A 01/29/2014   Procedure: LEFT HEART CATHETERIZATION WITH CORONARY ANGIOGRAM;  Surgeon: Blane Ohara, MD;  Location: [facility] CATH LAB;  Service: Cardiovascular;  Laterality: N/A;  . PACEMAKER INSERTION  04/23/2014   STJ Assurity dual chamber pacemaker implanted by Dr Rayann Heman  . PERMANENT PACEMAKER INSERTION N/A 04/23/2014   Procedure: PERMANENT PACEMAKER INSERTION;  Surgeon: Evans Lance, MD;  Location: [facility] CATH LAB;  Service: Cardiovascular;  Laterality: N/A;  . RIGHT HEART CATHETERIZATION N/A 09/22/2014   Procedure: RIGHT HEART CATH;  Surgeon: Jolaine Artist, MD;  Location[facility] CATH LAB;  Service: Cardiovascular;  Laterality: N/A;  . [place]  Right     FAMHX: Family History  Problem Relation Age of Onset  . Coronary artery disease Father   . Heart disease Father   . Heart attack Father   . Alzheimer's disease Mother   . Cancer Brother 42    lung cancer  . Lung cancer      Social History:  reports that she has never smoked. She has never used smokeless tobacco. She reports that she does not drink alcohol or use drugs.  Allergies:  Allergies  Allergen Reactions  . Other Shortness Of Breath    Detergents, perfumes and other strong odor emitting compounds  . Tramadol Shortness Of Breath and Nausea Only  . Ibuprofen Other (See Comments)    Told not to take med    Medications: I have  reviewed the patient's current medications.  Dg Chest Portable 1 View  Result Date: 10/15/2016 CLINICAL DATA:  Chest pain. EXAM: PORTABLE CHEST 1 VIEW COMPARISON:  08/01/2016 FINDINGS: Stable appearance of the right dual chamber cardiac pacemaker. Increased interstitial densities in both lungs are suggestive for pulmonary edema. Heart size is within normal limits. Atherosclerotic calcifications at the aortic arch. No large pleural effusions. Negative for a pneumothorax. IMPRESSION: Evidence for interstitial pulmonary edema. Aortic atherosclerosis. Electronically Signed   By: Markus Daft M.D.   On: 10/15/2016 07:38    ROS  As stated in the HPI and negative for all other systems.  Physical Exam  Vitals:Blood pressure 104/57, pulse 70, temperature 98.2 F (36.8 C), temperature source Oral, resp. rate 15, height 4\' 10"  (1.473 m), weight 131 lb (59.4 kg), SpO2 94 %.  Well appearing elderly woman, NAD HEENT: Unremarkable Neck:  6 cm JVD, no thyromegally Lymphatics:  No adenopathy Back:  No CVA tenderness Lungs:  Clear with no wheezes HEART:  Regular rate rhythm, no murmurs, no rubs, no clicks Abd:  Soft, non-tender,  positive bowel sounds, no organomegally, no rebound, no guarding Ext:  2 plus pulses, no edema, no cyanosis, no clubbing Skin:  No rashes no nodules Neuro:  CN II through XII intact, motor grossly intact  ECG - NSR with ventricular pacing  Labs - reviewed  CXR - reviewed  Assessment/Plan: 1. Non-cardiac chest pain - she is pain free. She is instructed to ambulate in the ER and if her pain is not reproduced, then she may be discharged to home.  2. Chronic diastolic heart failure - her symptoms are compensated. No evidence of worsening. 3. PPM - her device appears to be working normally. I have asked her to followup with me as scheduled in the next 2 weeks. No change in her meds for now.  Ha Placeres,M.D.   Carleene Overlie TaylorMD 10/15/2016, 1:27 PM

## 2016-10-15 NOTE — ED Notes (Signed)
CRITICAL VALUE ALERT  Critical value received:  Troponin 0.03  Date of notification:  10/15/16  Time of notification:  0906  Critical value read back:Yes.    Nurse who received alert:  Jake Bathe, RN  MD notified (1st page):  Margarita Mail, PA  Time of first page:  [phone]  MD notified (2nd page):  Time of second page:  Responding MD:  Margarita Mail, PA  Time MD responded:  [phone]

## 2016-10-15 NOTE — ED Notes (Addendum)
Pt ambulated to and from restroom; tolerated well; pt stated that she did not experience any chest pain while ambulating

## 2016-10-15 NOTE — ED Notes (Signed)
Xray at BS 

## 2016-10-15 NOTE — ED Triage Notes (Addendum)
reports had cough at 0130. Woke from sleep at 0300 with sudden onset substernal CP, took ntg at 0300 w/o relief, called EMS at 0600, arrives alert, NAD, calm, interactive, resps e/u, speaking in clear complete sentences, no dyspnea noted, no IV in place, ASA 324mg  given, here from house, lives alone, daughter aware and is on the way, takes amiodarone, eliquis, and demedex, pt of Dr. Missy Sabins, demand paced.

## 2016-10-15 NOTE — ED Notes (Signed)
Pt ambulated to and from restroom w/ use of cane; tolerated well

## 2016-10-15 NOTE — ED Notes (Signed)
Cardiologist at bedside.  Pt given food and fluids

## 2016-10-20 ENCOUNTER — Encounter: Payer: Self-pay | Admitting: *Deleted

## 2016-10-21 ENCOUNTER — Ambulatory Visit (INDEPENDENT_AMBULATORY_CARE_PROVIDER_SITE_OTHER): Payer: Medicare Other | Admitting: Family Medicine

## 2016-10-21 VITALS — BP 110/66 | HR 86 | Temp 97.7°F | Ht <= 58 in | Wt 133.9 lb

## 2016-10-21 DIAGNOSIS — M79642 Pain in left hand: Secondary | ICD-10-CM | POA: Diagnosis not present

## 2016-10-21 DIAGNOSIS — W57XXXA Bitten or stung by nonvenomous insect and other nonvenomous arthropods, initial encounter: Secondary | ICD-10-CM

## 2016-10-21 NOTE — Progress Notes (Signed)
Pre visit review using our clinic review tool, if applicable. No additional management support is needed unless otherwise documented below in the visit note. 

## 2016-10-21 NOTE — Patient Instructions (Signed)
You do not have signs of infection at this time Follow up for any increased redness, swelling, heat, or pain.

## 2016-10-21 NOTE — Progress Notes (Signed)
Subjective:     Patient ID: [name], female   DOB: [birth year], 82 y.o.   MRN: [id number]  HPI   Patient seen with concern for possible bug bite yesterday. She states she was walking along and grabbed a rail with her left hand. She describes some type of bug - thought it looked like a centipede and she felt a sting sensation along the mid aspect of her left palm. She had some pain afterwards but had no swelling or warmth or redness. Much less painful today. No itching.  Past Medical History:  Diagnosis Date  . Anemia   . Atrial fibrillation ([place])    a. Dx 01/2014->Eliquis started.  . Cancer ([place])   . Chest pain    a. 2011 Neg MV;  b. 01/2014 Cath: LM 20-30, LAD nl, D1 nl, LCX nl, OM1 nl, RCA dom 30-73m, PD/PL nl, EF 65%->Med Rx; c. 03/2016 MV: no ischemia/infarct. EF 63%.  . Degenerative joint disease   . Diastolic CHF, acute on chronic ([place]) 01/31/2012  . Dyslipidemia   . Dysrhythmia   . Family history of adverse reaction to anesthesia    " multiple family members have difficulty waking "  . GERD (gastroesophageal reflux disease)   . History of skin cancer    tafeen/ non melanoma.   Marland Kitchen Hx of varicella   . Hypertension   . Hypothyroidism   . Monoclonal gammopathies   . Pacemaker   . Palpitations    PVCs/bigeminy on event in May 2009 revealing this was relatively asymptomatic  . Right lower quadrant abdominal pain 07/19/2012   Recurrent  With nausea    This is new since she had ct for llq pain in [facility]    R/o appendiceal problem  Hernia  Get ct scan  And plan  Fu     . Thrombocytopenia ([place])    Past Surgical History:  Procedure Laterality Date  . CARDIOVERSION N/A 02/26/2014   Procedure: CARDIOVERSION AT BEDSIDE;  Surgeon: Pixie Casino, MD;  Location: [place];  Service: Cardiovascular;  Laterality: N/A;  . CARDIOVERSION N/A 04/22/2014   Procedure: CARDIOVERSION (BEDSIDE);  Surgeon: [name], MD;  Location: [facility] OR;  Service: Cardiovascular;  Laterality: N/A;  . CATARACT  EXTRACTION     Bilateral  implantt  . CESAREAN SECTION     times 2  . CHOLECYSTECTOMY  1999  . ESOPHAGOGASTRODUODENOSCOPY N/A 07/06/2014   Procedure: ESOPHAGOGASTRODUODENOSCOPY (EGD);  Surgeon: Ladene Artist, MD;  Location: [facility] ENDOSCOPY;  Service: Endoscopy;  Laterality: N/A;  . INSERT / REPLACE / REMOVE PACEMAKER    . LEFT HEART CATHETERIZATION WITH CORONARY ANGIOGRAM N/A 01/29/2014   Procedure: LEFT HEART CATHETERIZATION WITH CORONARY ANGIOGRAM;  Surgeon: Blane Ohara, MD;  Location: [facility] CATH LAB;  Service: Cardiovascular;  Laterality: N/A;  . PACEMAKER INSERTION  04/23/2014   STJ Assurity dual chamber pacemaker implanted by Dr Rayann Heman  . PERMANENT PACEMAKER INSERTION N/A 04/23/2014   Procedure: PERMANENT PACEMAKER INSERTION;  Surgeon: Evans Lance, MD;  Location: [facility] CATH LAB;  Service: Cardiovascular;  Laterality: N/A;  . RIGHT HEART CATHETERIZATION N/A 09/22/2014   Procedure: RIGHT HEART CATH;  Surgeon: Jolaine Artist, MD;  Location: [facility] CATH LAB;  Service: Cardiovascular;  Laterality: N/A;  . SHOULDER SURGERY  1996   Right     reports that she has never smoked. She has never used smokeless tobacco. She reports that she does not drink alcohol or use drugs. family history includes Alzheimer's disease in her  mother; Coronary artery disease in her father; Heart attack in her father; Heart disease in her father; Lung cancer (age of onset: 67) in her brother. Allergies  Allergen Reactions  . Other Shortness Of Breath    Detergents, perfumes and other strong odor emitting compounds  . Tramadol Shortness Of Breath and Nausea Only  . Ibuprofen Other (See Comments)    Told not to take med     Review of Systems  Constitutional: Negative for chills and fever.       Objective:   Physical Exam  Constitutional: She appears well-developed and well-nourished.  Cardiovascular: Normal rate and regular rhythm.   Skin:  Very small punctate area palm of left hand. There is no swelling. No  erythema. No warmth. Nontender. No pustules.       Assessment:     Possible bite left hand from insect. She has no signs of infection and no significant allergic reaction    Plan:     -Observe with reassurance given. Follow-up for any redness, swelling, pain, or other concerns.  Eulas Post MD [facility] at [facility]

## 2016-10-25 ENCOUNTER — Ambulatory Visit (INDEPENDENT_AMBULATORY_CARE_PROVIDER_SITE_OTHER): Payer: Medicare Other | Admitting: Internal Medicine

## 2016-10-25 ENCOUNTER — Encounter: Payer: Self-pay | Admitting: Internal Medicine

## 2016-10-25 DIAGNOSIS — Z950 Presence of cardiac pacemaker: Secondary | ICD-10-CM | POA: Diagnosis not present

## 2016-10-25 LAB — CUP PACEART INCLINIC DEVICE CHECK
Battery Voltage: 2.98 V
Brady Statistic RA Percent Paced: 99.72 %
Brady Statistic RV Percent Paced: 99.88 %
Implantable Lead Implant Date: 20150610
Implantable Lead Location: 753859
Implantable Lead Model: 5076
Implantable Lead Model: 5076
Lead Channel Impedance Value: 475 Ohm
Lead Channel Impedance Value: 512.5 Ohm
Lead Channel Pacing Threshold Amplitude: 1.25 V
Lead Channel Pacing Threshold Pulse Width: 0.4 ms
Lead Channel Sensing Intrinsic Amplitude: 4.2 mV
Lead Channel Setting Pacing Amplitude: 2 V
Lead Channel Setting Pacing Amplitude: 2.5 V
Lead Channel Setting Sensing Sensitivity: 2 mV
MDC IDC LEAD IMPLANT DT: 20150610
MDC IDC LEAD LOCATION: 753860
MDC IDC MSMT LEADCHNL RA PACING THRESHOLD AMPLITUDE: 0.75 V
MDC IDC MSMT LEADCHNL RA PACING THRESHOLD PULSEWIDTH: 0.4 ms
MDC IDC MSMT LEADCHNL RV SENSING INTR AMPL: 12 mV
MDC IDC PG IMPLANT DT: 20150610
MDC IDC PG SERIAL: 7627922
MDC IDC SESS DTM: 20171212134902
MDC IDC SET LEADCHNL RV PACING PULSEWIDTH: 0.4 ms

## 2016-10-25 NOTE — Progress Notes (Signed)
HPI [name] returns today for followup of her PPM. She is a pleasant 82 yo woman with a h/o symptomatic sinus node dysfunction, s/p PPM insertion who returns today for ongoing evaluation and management of her PPM. She has been found to have atrial fibrillation and has been placed on low dose Eliquis. She feels week but denies syncope. She does not know that she is in atrial fib. She has been on amiodarone and has now maintained NSR.  She has not been in the hospital since her last check.  Allergies  Allergen Reactions  . Other Shortness Of Breath    Detergents, perfumes and other strong odor emitting compounds  . Tramadol Shortness Of Breath and Nausea Only  . Ibuprofen Other (See Comments)    Told not to take med     Current Outpatient Prescriptions  Medication Sig Dispense Refill  . acetaminophen (TYLENOL) 325 MG tablet Take 2 tablets (650 mg total) by mouth every 4 (four) hours as needed for headache or mild pain.    Marland Kitchen amiodarone (PACERONE) 200 MG tablet TAKE ONE TABLET BY MOUTH ONCE DAILY 30 tablet 0  . denosumab (PROLIA) 60 MG/ML SOLN injection Inject 60 mg into the skin every 6 (six) months. Administer in upper arm, thigh, or abdomen    . diphenhydrAMINE (BENADRYL) 25 MG tablet Take 25 mg by mouth every 6 (six) hours as needed.    Marland Kitchen ELIQUIS 2.5 MG TABS tablet TAKE ONE TABLET BY MOUTH TWICE DAILY 60 tablet 6  . erythromycin ophthalmic ointment Place 1 application into both eyes 2 (two) times daily. 3.5 g 1  . Ferrous Sulfate (IRON) 325 (65 Fe) MG TABS Take 1 tablet by mouth daily. 180 each 0  . levothyroxine (SYNTHROID, LEVOTHROID) 75 MCG tablet TAKE ONE TABLET BY MOUTH ONCE DAILY 90 tablet 3  . meclizine (ANTIVERT) 12.5 MG tablet Take 1 tablet (12.5 mg total) by mouth 2 (two) times daily as needed for dizziness. 10 tablet 0  . metolazone (ZAROXOLYN) 2.5 MG tablet Take 1 tablet (2.5 mg total) by mouth as needed (for weight 134 lb or greater). 10 tablet 3  . nitroGLYCERIN  (NITROSTAT) 0.4 MG SL tablet Place 1 tablet (0.4 mg total) under the tongue every 5 (five) minutes as needed. For chest pain 25 tablet 6  . oxyCODONE-acetaminophen (PERCOCET/ROXICET) 5-325 MG tablet Take 1 tablet by mouth daily as needed for pain.  0  . pantoprazole (PROTONIX) 40 MG tablet Take 40 mg by mouth daily.    . potassium chloride SA (K-DUR,KLOR-CON) 20 MEQ tablet Take 2 tablets (40 mEq total) by mouth daily. Take an additional 54meq with Metolazone. 90 tablet 2  . simvastatin (ZOCOR) 10 MG tablet TAKE ONE TABLET BY MOUTH ONCE DAILY AT BEDTIME 30 tablet 3  . torsemide (DEMADEX) 20 MG tablet Take 2 tablets (40 mg total) by mouth daily. 60 tablet 6   No current facility-administered medications for this visit.      Past Medical History:  Diagnosis Date  . Anemia   . Atrial fibrillation ([place])    a. Dx 01/2014->Eliquis started.  . Cancer ([place])   . Chest pain    a. 2011 Neg MV;  b. 01/2014 Cath: LM 20-30, LAD nl, D1 nl, LCX nl, OM1 nl, RCA dom 30-43m, PD/PL nl, EF 65%->Med Rx; c. 03/2016 MV: no ischemia/infarct. EF 63%.  . Degenerative joint disease   . Diastolic CHF, acute on chronic ([place]) 01/31/2012  . Dyslipidemia   .  Dysrhythmia   . Family history of adverse reaction to anesthesia    " multiple family members have difficulty waking "  . GERD (gastroesophageal reflux disease)   . History of skin cancer    tafeen/ non melanoma.   Marland Kitchen Hx of varicella   . Hypertension   . Hypothyroidism   . Monoclonal gammopathies   . Pacemaker   . Palpitations    PVCs/bigeminy on event in May 2009 revealing this was relatively asymptomatic  . Right lower quadrant abdominal pain 07/19/2012   Recurrent  With nausea    This is new since she had ct for llq pain in [facility]    R/o appendiceal problem  Hernia  Get ct scan  And plan  Fu     . Thrombocytopenia ([place])     ROS:   All systems reviewed and negative except as noted in the HPI.   Past Surgical History:  Procedure Laterality Date  .  CARDIOVERSION N/A 02/26/2014   Procedure: CARDIOVERSION AT BEDSIDE;  Surgeon: Pixie Casino, MD;  Location: [place];  Service: Cardiovascular;  Laterality: N/A;  . CARDIOVERSION N/A 04/22/2014   Procedure: CARDIOVERSION (BEDSIDE);  Surgeon: [name], MD;  Location: [facility] OR;  Service: Cardiovascular;  Laterality: N/A;  . CATARACT EXTRACTION     Bilateral  implantt  . CESAREAN SECTION     times 2  . CHOLECYSTECTOMY  1999  . ESOPHAGOGASTRODUODENOSCOPY N/A 07/06/2014   Procedure: ESOPHAGOGASTRODUODENOSCOPY (EGD);  Surgeon: Ladene Artist, MD;  Location: [facility] ENDOSCOPY;  Service: Endoscopy;  Laterality: N/A;  . INSERT / REPLACE / REMOVE PACEMAKER    . LEFT HEART CATHETERIZATION WITH CORONARY ANGIOGRAM N/A 01/29/2014   Procedure: LEFT HEART CATHETERIZATION WITH CORONARY ANGIOGRAM;  Surgeon: Blane Ohara, MD;  Location: [facility] CATH LAB;  Service: Cardiovascular;  Laterality: N/A;  . PACEMAKER INSERTION  04/23/2014   STJ Assurity dual chamber pacemaker implanted by Dr Rayann Heman  . PERMANENT PACEMAKER INSERTION N/A 04/23/2014   Procedure: PERMANENT PACEMAKER INSERTION;  Surgeon: Evans Lance, MD;  Location: [facility] CATH LAB;  Service: Cardiovascular;  Laterality: N/A;  . RIGHT HEART CATHETERIZATION N/A 09/22/2014   Procedure: RIGHT HEART CATH;  Surgeon: Jolaine Artist, MD;  Location: [facility] CATH LAB;  Service: Cardiovascular;  Laterality: N/A;  . SHOULDER SURGERY  1996   Right      Family History  Problem Relation Age of Onset  . Coronary artery disease Father   . Heart disease Father   . Heart attack Father   . Alzheimer's disease Mother   . Lung cancer Brother 51  . Lung cancer       Social History   Social History  . Marital status: Widowed    Spouse name: N/A  . Number of children: 3  . Years of education: N/A   Occupational History  .  Retired    [employer], book keeping   Social History Main Topics  . Smoking status: Never Smoker  . Smokeless tobacco: Never Used  . Alcohol  use No  . Drug use: No  . Sexual activity: No   Other Topics Concern  . 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 Eats with her.      Daughterrenee handles medications. Sees HH and PT once a week.   Helper  Lu[place] t the Friday 1-3 pm     BP 120/78 (BP Location: Left Arm, Patient Position: Sitting, Cuff Size: Normal)   Pulse 70   Ht 4\' 10"  (1.473 m)   Wt 132 lb 4 oz (60 kg)   SpO2 98%   BMI 27.64 kg/m   Physical Exam:  Chronically ill appearing elderly woman, NAD HEENT: Unremarkable Neck:  No JVD, no thyromegally Back:  No CVA tenderness Lungs:  Clear with no wheezes HEART:  IRegular rate rhythm, no murmurs, no rubs, no clicks Abd:  soft, positive bowel sounds, no organomegally, no rebound, no guarding Ext:  2 plus pulses, no edema, no cyanosis, no clubbing Skin:  No rashes no nodules Neuro:  CN II through XII intact, motor grossly intact   DEVICE  Normal device function.  See PaceArt for details.   Assess/Plan: 1. PAF - she will continue amiodarone. She appears to be maintaining NSR 2. Sinus node dysfunction - her underlying HR today was 35. She is asymptomatic, s/p PPM 3. PPM - her [employer] Jude DDD PM is working normally. Will recheck in several months. 4. Coags - she is tolerating her Eliquis and has had no bleeding or falling.  Mikle Bosworth.D.

## 2016-10-25 NOTE — Patient Instructions (Signed)
Medication Instructions:  Your physician recommends that you continue on your current medications as directed. Please refer to the Current Medication list given to you today.   Labwork: None Ordered   Testing/Procedures: None Ordered   Follow-Up: Your physician wants you to follow-up in: 1 year with Dr. Lovena Le. You will receive a reminder letter in the mail two months in advance. If you don't receive a letter, please call our office to schedule the follow-up appointment.  Remote monitoring is used to monitor your Pacemaker from home. This monitoring reduces the number of office visits required to check your device to one time per year. It allows [place] to keep an eye on the functioning of your device to ensure it is working properly. You are scheduled for a device check from home on 01/24/17. You may send your transmission at any time that day. If you have a wireless device, the transmission will be sent automatically. After your physician reviews your transmission, you will receive a postcard with your next transmission date.     Any Other Special Instructions Will Be Listed Below (If Applicable).     If you need a refill on your cardiac medications before your next appointment, please call your pharmacy.

## 2016-11-02 ENCOUNTER — Emergency Department (HOSPITAL_COMMUNITY): Payer: Medicare Other

## 2016-11-02 ENCOUNTER — Emergency Department (HOSPITAL_COMMUNITY)
Admission: EM | Admit: 2016-11-02 | Discharge: 2016-11-02 | Disposition: A | Discharge status: Home / Self Care | Payer: Medicare Other | Attending: Emergency Medicine | Admitting: Emergency Medicine

## 2016-11-02 ENCOUNTER — Encounter (HOSPITAL_COMMUNITY): Payer: Self-pay | Admitting: Emergency Medicine

## 2016-11-02 DIAGNOSIS — Z85828 Personal history of other malignant neoplasm of skin: Secondary | ICD-10-CM | POA: Diagnosis not present

## 2016-11-02 DIAGNOSIS — N184 Chronic kidney disease, stage 4 (severe): Secondary | ICD-10-CM | POA: Diagnosis not present

## 2016-11-02 DIAGNOSIS — I5042 Chronic combined systolic (congestive) and diastolic (congestive) heart failure: Secondary | ICD-10-CM | POA: Diagnosis not present

## 2016-11-02 DIAGNOSIS — I130 Hypertensive heart and chronic kidney disease with heart failure and stage 1 through stage 4 chronic kidney disease, or unspecified chronic kidney disease: Secondary | ICD-10-CM | POA: Insufficient documentation

## 2016-11-02 DIAGNOSIS — Z950 Presence of cardiac pacemaker: Secondary | ICD-10-CM | POA: Diagnosis not present

## 2016-11-02 DIAGNOSIS — Z79899 Other long term (current) drug therapy: Secondary | ICD-10-CM | POA: Insufficient documentation

## 2016-11-02 DIAGNOSIS — Z7901 Long term (current) use of anticoagulants: Secondary | ICD-10-CM | POA: Diagnosis not present

## 2016-11-02 DIAGNOSIS — R079 Chest pain, unspecified: Secondary | ICD-10-CM

## 2016-11-02 DIAGNOSIS — E039 Hypothyroidism, unspecified: Secondary | ICD-10-CM | POA: Insufficient documentation

## 2016-11-02 LAB — BASIC METABOLIC PANEL
Anion gap: 8 (ref 5–15)
BUN: 35 mg/dL — ABNORMAL HIGH (ref 6–20)
CALCIUM: 9.3 mg/dL (ref 8.9–10.3)
CO2: 24 mmol/L (ref 22–32)
Chloride: 107 mmol/L (ref 101–111)
Creatinine, Ser: 2.38 mg/dL — ABNORMAL HIGH (ref 0.44–1.00)
GFR, EST AFRICAN AMERICAN: 20 mL/min — AB (ref >60)
GFR, EST NON AFRICAN AMERICAN: 17 mL/min — AB (ref >60)
Glucose, Bld: 111 mg/dL — ABNORMAL HIGH (ref 65–99)
Potassium: 4.5 mmol/L (ref 3.5–5.1)
SODIUM: 139 mmol/L (ref 135–145)

## 2016-11-02 LAB — I-STAT TROPONIN, ED
TROPONIN I, POC: 0.02 ng/mL (ref 0.00–0.08)
Troponin i, poc: 0.02 ng/mL (ref 0.00–0.08)

## 2016-11-02 LAB — CBC
HCT: 38.2 % (ref 36.0–46.0)
HEMOGLOBIN: 12.3 g/dL (ref 12.0–15.0)
MCH: 30.9 pg (ref 26.0–34.0)
MCHC: 32.2 g/dL (ref 30.0–36.0)
MCV: 96 fL (ref 78.0–100.0)
PLATELETS: 117 10*3/uL — AB (ref 150–400)
RBC: 3.98 MIL/uL (ref 3.87–5.11)
RDW: 14.6 % (ref 11.5–15.5)
WBC: 4.7 10*3/uL (ref 4.0–10.5)

## 2016-11-02 LAB — BRAIN NATRIURETIC PEPTIDE: B NATRIURETIC PEPTIDE 5: 292.6 pg/mL — AB (ref 0.0–100.0)

## 2016-11-02 LAB — PROTIME-INR
INR: 1.3
PROTHROMBIN TIME: 16.3 s — AB (ref 11.4–15.2)

## 2016-11-02 MED ORDER — FUROSEMIDE 10 MG/ML IJ SOLN
40.0000 mg | Freq: Once | INTRAMUSCULAR | Status: AC
  Administered 2016-11-02: 40 mg via INTRAVENOUS
  Filled 2016-11-02: qty 4

## 2016-11-02 MED ORDER — SODIUM CHLORIDE 0.9 % IV BOLUS (SEPSIS)
500.0000 mL | Freq: Once | INTRAVENOUS | Status: AC
  Administered 2016-11-02: 500 mL via INTRAVENOUS

## 2016-11-02 MED ORDER — ASPIRIN 81 MG PO CHEW
324.0000 mg | CHEWABLE_TABLET | Freq: Once | ORAL | Status: AC
  Administered 2016-11-02: 324 mg via ORAL
  Filled 2016-11-02: qty 4

## 2016-11-02 NOTE — ED Notes (Addendum)
Ambulated with pt on, pt's sats remained >93% on room air. No SOB exhibited

## 2016-11-02 NOTE — ED Notes (Signed)
Patient transported to X-ray 

## 2016-11-02 NOTE — ED Notes (Signed)
Called lab to add BNP

## 2016-11-02 NOTE — ED Provider Notes (Signed)
[facility] DEPT Provider Note   CSN: [id number] Arrival date & time: 11/02/16  0430     History   Chief Complaint Chief Complaint  Patient presents with  . Chest Pain    HPI [name] is a 82 y.o. female.  HPI  This is an 82 year old female with history of atrial fibrillation, diastolic CHF, hypertension who presents with chest pain. Patient states she had onset of chest pain after her dog woke her up. She states that her dog was barking. She sat up in bed. Had central pressure-like chest pain that radiated to her back. Associated shortness of breath and nausea. She took nitroglycerin and aspirin with resolution of her symptoms. She is currently pain-free. Denies recent leg swelling or changes in her medications. She is followed primarily by Dr. Haroldine Laws.  Cardiac catheterization in March 2015 with diffuse nonobstructive disease. 20-30% lesion and 30 to 40% lesion.  Past Medical History:  Diagnosis Date  . Anemia   . Atrial fibrillation ([place])    a. Dx 01/2014->Eliquis started.  . Cancer ([place])   . Chest pain    a. 2011 Neg MV;  b. 01/2014 Cath: LM 20-30, LAD nl, D1 nl, LCX nl, OM1 nl, RCA dom 30-73m, PD/PL nl, EF 65%->Med Rx; c. 03/2016 MV: no ischemia/infarct. EF 63%.  . Degenerative joint disease   . Diastolic CHF, acute on chronic ([place]) 01/31/2012  . Dyslipidemia   . Dysrhythmia   . Family history of adverse reaction to anesthesia    " multiple family members have difficulty waking "  . GERD (gastroesophageal reflux disease)   . History of skin cancer    tafeen/ non melanoma.   Marland Kitchen Hx of varicella   . Hypertension   . Hypothyroidism   . Monoclonal gammopathies   . Pacemaker   . Palpitations    PVCs/bigeminy on event in May 2009 revealing this was relatively asymptomatic  . Right lower quadrant abdominal pain 07/19/2012   Recurrent  With nausea    This is new since she had ct for llq pain in [facility]    R/o appendiceal problem  Hernia  Get ct scan  And plan  Fu      . Thrombocytopenia [facility])     Patient Active Problem List   Diagnosis Date Noted  . Hypertensive heart disease 04/01/2016  . PAF (paroxysmal atrial fibrillation) ([place]) 03/31/2016  . Chest pain with moderate risk for cardiac etiology, negative MI and Nuc study, + muscular skeletal   . Atypical chest pain 01/21/2016  . CKD (chronic kidney disease) stage 4, GFR 15-29 ml/min (HCC) 05/09/2015  . Anemia of chronic disease 05/09/2015  . Dyslipidemia 05/09/2015  . Iron deficiency anemia 11/21/2014  . Pacemaker 04/27/2014  . Chronic anticoagulation 02/25/2014  . Chronic combined systolic and diastolic CHF (congestive heart failure) ([place]) 02/24/2014  . Atrial fibrillation ([place]) 01/29/2014  . MGUS (monoclonal gammopathy of unknown significance) 08/01/2013  . Hypothyroidism 02/19/2012  . Hyperlipidemia 12/15/2008  . HYPERTENSION, BENIGN 12/15/2008    Past Surgical History:  Procedure Laterality Date  . CARDIOVERSION N/A 02/26/2014   Procedure: CARDIOVERSION AT BEDSIDE;  Surgeon: Pixie Casino, MD;  Location: [facility];  Service: Cardiovascular;  Laterality: N/A;  . CARDIOVERSION N/A 04/22/2014   Procedure: CARDIOVERSION (BEDSIDE);  Surgeon: [name], MD;  Location: [facility] OR;  Service: Cardiovascular;  Laterality: N/A;  . CATARACT EXTRACTION     Bilateral  implantt  . CESAREAN SECTION     times 2  . CHOLECYSTECTOMY  1999  . ESOPHAGOGASTRODUODENOSCOPY N/A 07/06/2014   Procedure: ESOPHAGOGASTRODUODENOSCOPY (EGD);  Surgeon: Ladene Artist, MD;  Location[facility] ENDOSCOPY;  Service: Endoscopy;  Laterality: N/A;  . INSERT / REPLACE / REMOVE PACEMAKER    . LEFT HEART CATHETERIZATION WITH CORONARY ANGIOGRAM N/A 01/29/2014   Procedure: LEFT HEART CATHETERIZATION WITH CORONARY ANGIOGRAM;  Surgeon: Blane Ohara, MD;  Location: [facility] CATH LAB;  Service: Cardiovascular;  Laterality: N/A;  . PACEMAKER INSERTION  04/23/2014   STJ Assurity dual chamber pacemaker implanted by Dr Rayann Heman  . PERMANENT  PACEMAKER INSERTION N/A 04/23/2014   Procedure: PERMANENT PACEMAKER INSERTION;  Surgeon: Evans Lance, MD;  Location: [facility] CATH LAB;  Service: Cardiovascular;  Laterality: N/A;  . RIGHT HEART CATHETERIZATION N/A 09/22/2014   Procedure: RIGHT HEART CATH;  Surgeon: Jolaine Artist, MD;  Location: [facility] CATH LAB;  Service: Cardiovascular;  Laterality: N/A;  . SHOULDER SURGERY  1996   Right     OB History    No data available       Home Medications    Prior to Admission medications   Medication Sig Start Date End Date Taking? Authorizing Provider  acetaminophen (TYLENOL) 325 MG tablet Take 2 tablets (650 mg total) by mouth every 4 (four) hours as needed for headache or mild pain. 09/26/14   Isaiah Serge, NP  amiodarone (PACERONE) 200 MG tablet TAKE ONE TABLET BY MOUTH ONCE DAILY 10/17/16   Evans Lance, MD  denosumab (PROLIA) 60 MG/ML SOLN injection Inject 60 mg into the skin every 6 (six) months. Administer in upper arm, thigh, or abdomen    Historical Provider, MD  diphenhydrAMINE (BENADRYL) 25 MG tablet Take 25 mg by mouth every 6 (six) hours as needed.    Historical Provider, MD  ELIQUIS 2.5 MG TABS tablet TAKE ONE TABLET BY MOUTH TWICE DAILY 10/18/16   Jolaine Artist, MD  erythromycin ophthalmic ointment Place 1 application into both eyes 2 (two) times daily. 07/19/16   Burnis Medin, MD  Ferrous Sulfate (IRON) 325 (65 Fe) MG TABS Take 1 tablet by mouth daily. 07/01/16   Clanford Marisa Hua, MD  levothyroxine (SYNTHROID, LEVOTHROID) 75 MCG tablet TAKE ONE TABLET BY MOUTH ONCE DAILY 02/29/16   Burnis Medin, MD  meclizine (ANTIVERT) 12.5 MG tablet Take 1 tablet (12.5 mg total) by mouth 2 (two) times daily as needed for dizziness. 09/14/16   Jolaine Artist, MD  metolazone (ZAROXOLYN) 2.5 MG tablet Take 1 tablet (2.5 mg total) by mouth as needed (for weight 134 lb or greater). 03/09/16   Jolaine Artist, MD  nitroGLYCERIN (NITROSTAT) 0.4 MG SL tablet Place 1 tablet (0.4 mg total)  under the tongue every 5 (five) minutes as needed. For chest pain 05/06/16   Jolaine Artist, MD  oxyCODONE-acetaminophen (PERCOCET/ROXICET) 5-325 MG tablet Take 1 tablet by mouth daily as needed for pain. 06/09/16   Historical Provider, MD  pantoprazole (PROTONIX) 40 MG tablet Take 40 mg by mouth daily.    Historical Provider, MD  potassium chloride SA (K-DUR,KLOR-CON) 20 MEQ tablet Take 2 tablets (40 mEq total) by mouth daily. Take an additional 67meq with Metolazone. 10/04/16   Jolaine Artist, MD  simvastatin (ZOCOR) 10 MG tablet TAKE ONE TABLET BY MOUTH ONCE DAILY AT BEDTIME 09/30/16   Jolaine Artist, MD  torsemide (DEMADEX) 20 MG tablet Take 2 tablets (40 mg total) by mouth daily. 04/03/16   Isaiah Serge, NP    Family History Family History  Problem Relation Age of Onset  . Coronary artery disease Father   . Heart disease Father   . Heart attack Father   . Alzheimer's disease Mother   . Lung cancer Brother 10  . Lung cancer      Social History Social History  Substance Use Topics  . Smoking status: Never Smoker  . Smokeless tobacco: Never Used  . Alcohol use No     Allergies   Other; Tramadol; and Ibuprofen   Review of Systems Review of Systems  Constitutional: Negative for fever.  Respiratory: Positive for shortness of breath.   Cardiovascular: Positive for chest pain. Negative for leg swelling.  Gastrointestinal: Negative for abdominal pain, nausea and vomiting.  Neurological: Positive for dizziness.  All other systems reviewed and are negative.    Physical Exam Updated Vital Signs BP 100/58 (BP Location: Right Arm)   Pulse 70   Temp 98.1 F (36.7 C) (Oral)   Resp 12   Ht 4\' 10"  (1.473 m)   Wt 125 lb (56.7 kg)   SpO2 91%   BMI 26.13 kg/m   Physical Exam  Constitutional: She is oriented to person, place, and time.  Elderly, no acute distress  HENT:  Head: Normocephalic and atraumatic.  Cardiovascular: Normal rate, regular rhythm and normal  heart sounds.   No murmur heard. Pulmonary/Chest: Effort normal and breath sounds normal. No respiratory distress. She has no wheezes.  Abdominal: Soft. Bowel sounds are normal. There is no tenderness. There is no guarding.  Musculoskeletal: She exhibits no edema.  Neurological: She is alert and oriented to person, place, and time.  Skin: Skin is warm and dry.  Psychiatric: She has a normal mood and affect.  Nursing note and vitals reviewed.    ED Treatments / Results  Labs (all labs ordered are listed, but only abnormal results are displayed) Labs Reviewed  BASIC METABOLIC PANEL - Abnormal; Notable for the following:       Result Value   Glucose, Bld 111 (*)    BUN 35 (*)    Creatinine, Ser 2.38 (*)    GFR calc non Af Amer 17 (*)    GFR calc Af Amer 20 (*)    All other components within normal limits  CBC - Abnormal; Notable for the following:    Platelets 117 (*)    All other components within normal limits  PROTIME-INR - Abnormal; Notable for the following:    Prothrombin Time 16.3 (*)    All other components within normal limits  BRAIN NATRIURETIC PEPTIDE - Abnormal; Notable for the following:    B Natriuretic Peptide 292.6 (*)    All other components within normal limits  I-STAT TROPOININ, ED    EKG  EKG Interpretation  Date/Time:  Wednesday November 02 2016 04:36:08 EST Ventricular Rate:  70 PR Interval:    QRS Duration: 159 QT Interval:  492 QTC Calculation: 531 R Axis:   88 Text Interpretation:  AV dual-paced rhythm Nonspecific intraventricular conduction delay Probable anteroseptal infarct, recent Reconfirmed by Crimson Beer  MD, Bryant Lipps ([id number]) on 11/02/2016 4:40:20 AM       Radiology Dg Chest 2 View  Result Date: 11/02/2016 CLINICAL DATA:  82 year old female with chest pain and nausea. EXAM: CHEST  2 VIEW COMPARISON:  Multiple chest radiographs dating back to 03/31/2016 FINDINGS: Mild cardiomegaly with hilar prominence and perihilar haziness compatible  with vascular congestion. No definite interstitial edema. Bibasilar, left greater right hazy airspace densities may represent atelectatic changes or developing infiltrate. There  is no pleural effusion or pneumothorax. Right pectoral dual lead pacemaker device noted. There is focal area of slight kink in the pacemaker wires in the right intercostal space. The wires however remains intact. There is atherosclerotic calcification of the aortic arch. There is osteopenia with degenerative changes of the spine. No acute fracture. IMPRESSION: Cardiomegaly with vascular congestion. Bilateral lower lung field hazy densities may represent atelectasis versus infiltrate. Clinical correlation is recommended. Electronically Signed   By: Anner Crete M.D.   On: 11/02/2016 05:21    Procedures Procedures (including critical care time)  Medications Ordered in ED Medications  sodium chloride 0.9 % bolus 500 mL (500 mLs Intravenous New Bag/Given 11/02/16 0620)  aspirin chewable tablet 324 mg (324 mg Oral Given 11/02/16 [id number])     Initial Impression / Assessment and Plan / ED Course  I have reviewed the triage vital signs and the nursing notes.  Pertinent labs & imaging results that were available during my care of the patient were reviewed by me and considered in my medical decision making (see chart for details).  Clinical Course     Patient presents with chest pain. Nontoxic on exam. Chest pain-free. Initial vital signs notable for marginal blood pressure 90s over 50s. His story with pressures 100s over 60s to 70s. EKG is nonischemic. Will interrogate pacemaker. Chest x-ray does show some vascular congestion. Will give a dose of Lasix for diuresis. O2 sats 92%. She does not appear peripherally volume overloaded. Initial troponin negative. Discussed with cardiology. Given prior catheterization with minimal diffuse disease, low suspicion for cardiac etiology. They recently consulted on her for similar symptoms.   Spoke with Dr. Gwenlyn Found.  Will repeat troponin.  Plan for repeat troponin and vital sign check including ambulation with pulse ox. Pacemaker interrogation pending as well.  Final Clinical Impressions(s) / ED Diagnoses   Final diagnoses:  None    New Prescriptions New Prescriptions   No medications on file     Merryl Hacker, MD 11/02/16 [phone]

## 2016-11-02 NOTE — ED Provider Notes (Signed)
Blood pressure 103/55, pulse 70, temperature 98.1 F (36.7 C), temperature source Oral, resp. rate 14, height 4\' 10"  (1.473 m), weight 125 lb (56.7 kg), SpO2 93 %.  Assuming care from Dr. Dina Rich.  In short, LICET EBNET is a 82 y.o. female with a chief complaint of Chest Pain .  Refer to the original H&P for additional details.  The current plan of care is to follow delta troponin, ambulate with pulse ox, and follow pacemaker interrogation.  08:15 AM Spoke with the patient regarding her normal repeat troponin. Following pacemaker report and will ambulate with pulse ox prior to deciding on disposition. Patient had a recent ED presentation for similar pain and was discharged with close Cardiology follow up.   11:30 AM Patient ambulatory with no drop in pulse ox. She is feeling well with no chest pain. Repeat troponin negative. Still waiting on pacemaker interrogation report. This was done several hours ago but the pacemaker company is having IT problems and cannot provide a reported this time. They are working to resolve the issue. Updated the patient.  Nanda Quinton, MD   Margette Fast, MD 11/02/16 2040

## 2016-11-02 NOTE — Discharge Instructions (Signed)

## 2016-11-02 NOTE — ED Triage Notes (Addendum)
BIB EMS from home. C/O CP since 0300. Describes as pressure, central chest with radiation to back. Also endorses SOB and nausea. Pt has pacemaker. Took 1NTG, 324 ASA, 4mg  zofran. VSS.

## 2016-11-02 NOTE — ED Notes (Signed)
EDP at bedside  

## 2016-11-02 NOTE — ED Notes (Signed)
This RN called [employer] to see why interrogation report had never been sent. Company said there had been problems with their programming system online, therefore no reports had been sent. Dr. Laverta Baltimore notified, this RN will try to call [employer] again.

## 2016-11-09 ENCOUNTER — Telehealth: Payer: Self-pay | Admitting: Physician Assistant

## 2016-11-09 NOTE — Telephone Encounter (Signed)
Paged by answering service, patient noted fluctuating HR from 70-100 on pulse oxymetry. Feeling fluttering sensation. Hx of PAF and PPM. Feels like afib. BP and pulse ox normal. The patient denies fever, chest pain,shortness of breath, orthopnea, PND, dizziness, syncope, cough, congestion, abdominal pain, hematochezia, melena, lower extremity edema.  Advised to take PM dose of Amio. If symptoms worsen overnight, go to ER or if persist overnight call office in AM to schedule appointment. Will need device check. Pt is aggress to plan.

## 2016-11-10 ENCOUNTER — Telehealth: Payer: Self-pay | Admitting: *Deleted

## 2016-11-10 ENCOUNTER — Other Ambulatory Visit: Payer: Self-pay | Admitting: Cardiology

## 2016-11-10 ENCOUNTER — Other Ambulatory Visit (HOSPITAL_COMMUNITY): Payer: Self-pay | Admitting: Internal Medicine

## 2016-11-10 ENCOUNTER — Other Ambulatory Visit: Payer: Self-pay | Admitting: Internal Medicine

## 2016-11-10 NOTE — Telephone Encounter (Signed)
needed clarification on amiodarone dosage. Clarified she takes one 200 mg tablet daily. Pt happy & expressed understanding.

## 2016-11-11 ENCOUNTER — Telehealth: Payer: Self-pay | Admitting: Internal Medicine

## 2016-11-11 ENCOUNTER — Emergency Department (HOSPITAL_COMMUNITY): Payer: Medicare Other

## 2016-11-11 ENCOUNTER — Encounter (HOSPITAL_COMMUNITY): Payer: Self-pay | Admitting: Neurology

## 2016-11-11 ENCOUNTER — Observation Stay (HOSPITAL_COMMUNITY)
Admission: EM | Admit: 2016-11-11 | Discharge: 2016-11-13 | Disposition: A | Discharge status: Home / Self Care | Payer: Medicare Other | Attending: Family Medicine | Admitting: Family Medicine

## 2016-11-11 DIAGNOSIS — I130 Hypertensive heart and chronic kidney disease with heart failure and stage 1 through stage 4 chronic kidney disease, or unspecified chronic kidney disease: Secondary | ICD-10-CM | POA: Insufficient documentation

## 2016-11-11 DIAGNOSIS — R1084 Generalized abdominal pain: Secondary | ICD-10-CM | POA: Diagnosis present

## 2016-11-11 DIAGNOSIS — E039 Hypothyroidism, unspecified: Secondary | ICD-10-CM | POA: Insufficient documentation

## 2016-11-11 DIAGNOSIS — N184 Chronic kidney disease, stage 4 (severe): Secondary | ICD-10-CM | POA: Diagnosis not present

## 2016-11-11 DIAGNOSIS — Z85828 Personal history of other malignant neoplasm of skin: Secondary | ICD-10-CM | POA: Insufficient documentation

## 2016-11-11 DIAGNOSIS — Z950 Presence of cardiac pacemaker: Secondary | ICD-10-CM | POA: Insufficient documentation

## 2016-11-11 DIAGNOSIS — Z79899 Other long term (current) drug therapy: Secondary | ICD-10-CM | POA: Insufficient documentation

## 2016-11-11 DIAGNOSIS — Z7983 Long term (current) use of bisphosphonates: Secondary | ICD-10-CM | POA: Diagnosis not present

## 2016-11-11 DIAGNOSIS — I5032 Chronic diastolic (congestive) heart failure: Secondary | ICD-10-CM | POA: Diagnosis not present

## 2016-11-11 DIAGNOSIS — K219 Gastro-esophageal reflux disease without esophagitis: Secondary | ICD-10-CM | POA: Diagnosis not present

## 2016-11-11 DIAGNOSIS — E785 Hyperlipidemia, unspecified: Secondary | ICD-10-CM | POA: Insufficient documentation

## 2016-11-11 DIAGNOSIS — N179 Acute kidney failure, unspecified: Secondary | ICD-10-CM | POA: Diagnosis not present

## 2016-11-11 DIAGNOSIS — K5900 Constipation, unspecified: Secondary | ICD-10-CM

## 2016-11-11 DIAGNOSIS — I4891 Unspecified atrial fibrillation: Secondary | ICD-10-CM | POA: Diagnosis present

## 2016-11-11 DIAGNOSIS — Z7901 Long term (current) use of anticoagulants: Secondary | ICD-10-CM | POA: Diagnosis not present

## 2016-11-11 DIAGNOSIS — N189 Chronic kidney disease, unspecified: Secondary | ICD-10-CM

## 2016-11-11 DIAGNOSIS — I480 Paroxysmal atrial fibrillation: Secondary | ICD-10-CM | POA: Diagnosis not present

## 2016-11-11 DIAGNOSIS — E876 Hypokalemia: Secondary | ICD-10-CM | POA: Diagnosis not present

## 2016-11-11 LAB — COMPREHENSIVE METABOLIC PANEL
ALBUMIN: 3.8 g/dL (ref 3.5–5.0)
ALK PHOS: 70 U/L (ref 38–126)
ALT: 21 U/L (ref 14–54)
AST: 27 U/L (ref 15–41)
Anion gap: 15 (ref 5–15)
BILIRUBIN TOTAL: 0.9 mg/dL (ref 0.3–1.2)
BUN: 57 mg/dL — AB (ref 6–20)
CALCIUM: 10 mg/dL (ref 8.9–10.3)
CO2: 28 mmol/L (ref 22–32)
Chloride: 93 mmol/L — ABNORMAL LOW (ref 101–111)
Creatinine, Ser: 3.11 mg/dL — ABNORMAL HIGH (ref 0.44–1.00)
GFR calc Af Amer: 14 mL/min — ABNORMAL LOW (ref >60)
GFR calc non Af Amer: 12 mL/min — ABNORMAL LOW (ref >60)
GLUCOSE: 117 mg/dL — AB (ref 65–99)
Potassium: 2.8 mmol/L — ABNORMAL LOW (ref 3.5–5.1)
Sodium: 136 mmol/L (ref 135–145)
TOTAL PROTEIN: 7.9 g/dL (ref 6.5–8.1)

## 2016-11-11 LAB — I-STAT TROPONIN, ED: TROPONIN I, POC: 0.04 ng/mL (ref 0.00–0.08)

## 2016-11-11 LAB — URINALYSIS, ROUTINE W REFLEX MICROSCOPIC
BACTERIA UA: NONE SEEN
Bilirubin Urine: NEGATIVE
GLUCOSE, UA: NEGATIVE mg/dL
HGB URINE DIPSTICK: NEGATIVE
Ketones, ur: NEGATIVE mg/dL
NITRITE: NEGATIVE
Protein, ur: NEGATIVE mg/dL
SPECIFIC GRAVITY, URINE: 1.005 (ref 1.005–1.030)
Squamous Epithelial / LPF: NONE SEEN
pH: 5 (ref 5.0–8.0)

## 2016-11-11 LAB — CBC
HCT: 44.4 % (ref 36.0–46.0)
Hemoglobin: 15 g/dL (ref 12.0–15.0)
MCH: 31.1 pg (ref 26.0–34.0)
MCHC: 33.8 g/dL (ref 30.0–36.0)
MCV: 91.9 fL (ref 78.0–100.0)
Platelets: 141 10*3/uL — ABNORMAL LOW (ref 150–400)
RBC: 4.83 MIL/uL (ref 3.87–5.11)
RDW: 13.6 % (ref 11.5–15.5)
WBC: 6.4 10*3/uL (ref 4.0–10.5)

## 2016-11-11 LAB — LIPASE, BLOOD: Lipase: 28 U/L (ref 11–51)

## 2016-11-11 MED ORDER — IOPAMIDOL (ISOVUE-300) INJECTION 61%
INTRAVENOUS | Status: AC
  Filled 2016-11-11: qty 30

## 2016-11-11 MED ORDER — POTASSIUM CHLORIDE CRYS ER 20 MEQ PO TBCR
40.0000 meq | EXTENDED_RELEASE_TABLET | Freq: Once | ORAL | Status: AC
  Administered 2016-11-11: 40 meq via ORAL
  Filled 2016-11-11: qty 2

## 2016-11-11 MED ORDER — SODIUM CHLORIDE 0.9 % IV SOLN
30.0000 meq | Freq: Once | INTRAVENOUS | Status: AC
  Administered 2016-11-11: 30 meq via INTRAVENOUS
  Filled 2016-11-11: qty 15

## 2016-11-11 NOTE — Telephone Encounter (Signed)
Pt is in [facility] ED. Nothing further needed at this time.

## 2016-11-11 NOTE — ED Notes (Signed)
Patient transported to X-ray via stretcher again.

## 2016-11-11 NOTE — H&P (Signed)
History and Physical    [name] [id number] DOB: [birth year] DOA: 11/11/2016  PCP: [name], MD  Cardiology: Dr. Sung Amabile  Patient coming from: HOME  Chief Complaint: Constipation, nausea, light-headedness  HPI: [name] is a 82 y.o. woman with a history of HTN, HLD, paroxysmal atrial fibrillation S/P PPM (CHADS-Vasc score of 5, anticoagulated with Eliquis), chronic diastolic heart failure, hypothyroidism, CKD 3 (baseline creatinine around 2.2), and MGUS who presents to the ED for evaluation of 3-4 days of constipation and nausea.  She has abdominal pain "across her entire abdomen" that she associates with straining for her bowel movements.  No vomiting.  No fever.  PO intake has been down.  She has been light-headed, but no syncope.  No vomiting.  She reports that pain from her abdomen has been radiating into her chest, but she distinguishes this pain from pain that has been cardiac in nature in the past ("This is not my heart.").  No swelling.  She actually reports that she has lost 10lbs in the past few weeks.  ED Course: Chest xray shows improved pulmonary edema compared to prior, stable cardiomegaly, and aortic atherosclerosis.  CT of the abdomen and pelvis shows that she is constipated with a large amount of stool present in the colon but there is no evidence of bowel obstruction or other acute pathology.  Labs show hypokalemia with potassium of 2.8 and AKI on CKD 3 with BUN 57 and creatinine 3.11.  Potassium replacement ordered in the ED.  Hospitalist asked to place in observation.    Review of Systems: As per HPI otherwise 10 point review of systems negative.    Past Medical History:  Diagnosis Date  . Anemia   . Atrial fibrillation ([place])    a. Dx 01/2014->Eliquis started.  . Cancer ([place])   . Chest pain    a. 2011 Neg MV;  b. 01/2014 Cath: LM 20-30, LAD nl, D1 nl, LCX nl, OM1 nl, RCA dom 30-89m, PD/PL nl, EF 65%->Med Rx; c. 03/2016 MV: no  ischemia/infarct. EF 63%.  . Degenerative joint disease   . Diastolic CHF, acute on chronic ([place]) 01/31/2012  . Dyslipidemia   . Dysrhythmia   . Family history of adverse reaction to anesthesia    " multiple family members have difficulty waking "  . GERD (gastroesophageal reflux disease)   . History of skin cancer    tafeen/ non melanoma.   Marland Kitchen Hx of varicella   . Hypertension   . Hypothyroidism   . Monoclonal gammopathies   . Pacemaker   . Palpitations    PVCs/bigeminy on event in May 2009 revealing this was relatively asymptomatic  . Right lower quadrant abdominal pain 07/19/2012   Recurrent  With nausea    This is new since she had ct for llq pain in [facility]    R/o appendiceal problem  Hernia  Get ct scan  And plan  Fu     . Thrombocytopenia ([place])     Past Surgical History:  Procedure Laterality Date  . CARDIOVERSION N/A 02/26/2014   Procedure: CARDIOVERSION AT BEDSIDE;  Surgeon: Pixie Casino, MD;  Location: [facility];  Service: Cardiovascular;  Laterality: N/A;  . CARDIOVERSION N/A 04/22/2014   Procedure: CARDIOVERSION (BEDSIDE);  Surgeon: [name], MD;  Location: [facility] OR;  Service: Cardiovascular;  Laterality: N/A;  . CATARACT EXTRACTION     Bilateral  implantt  . CESAREAN SECTION     times 2  . CHOLECYSTECTOMY  1999  .  ESOPHAGOGASTRODUODENOSCOPY N/A 07/06/2014   Procedure: ESOPHAGOGASTRODUODENOSCOPY (EGD);  Surgeon: Ladene Artist, MD;  Location[facility] ENDOSCOPY;  Service: Endoscopy;  Laterality: N/A;  . INSERT / REPLACE / REMOVE PACEMAKER    . LEFT HEART CATHETERIZATION WITH CORONARY ANGIOGRAM N/A 01/29/2014   Procedure: LEFT HEART CATHETERIZATION WITH CORONARY ANGIOGRAM;  Surgeon: Blane Ohara, MD;  Location: [facility] CATH LAB;  Service: Cardiovascular;  Laterality: N/A;  . PACEMAKER INSERTION  04/23/2014   STJ Assurity dual chamber pacemaker implanted by Dr Rayann Heman  . PERMANENT PACEMAKER INSERTION N/A 04/23/2014   Procedure: PERMANENT PACEMAKER INSERTION;  Surgeon: Evans Lance,  MD;  Location: [facility] CATH LAB;  Service: Cardiovascular;  Laterality: N/A;  . RIGHT HEART CATHETERIZATION N/A 09/22/2014   Procedure: RIGHT HEART CATH;  Surgeon: Jolaine Artist, MD;  Location: [facility] CATH LAB;  Service: Cardiovascular;  Laterality: N/A;  . SHOULDER SURGERY  1996   Right      reports that she has never smoked. She has never used smokeless tobacco. She reports that she does not drink alcohol or use drugs. She lives independently.  She ambulates with a walker at baseline.  Allergies  Allergen Reactions  . Other Shortness Of Breath    Detergents, perfumes and other strong odor emitting compounds  . Tramadol Shortness Of Breath and Nausea Only  . Ibuprofen Other (See Comments)    Told not to take med    Family History  Problem Relation Age of Onset  . Coronary artery disease Father   . Heart disease Father   . Heart attack Father   . Alzheimer's disease Mother   . Lung cancer Brother 2  . Lung cancer       Prior to Admission medications   Medication Sig Start Date End Date Taking? Authorizing Provider  amiodarone (PACERONE) 200 MG tablet TAKE ONE TABLET BY MOUTH ONCE DAILY 11/10/16  Yes Evans Lance, MD  denosumab (PROLIA) 60 MG/ML SOLN injection Inject 60 mg into the skin every 6 (six) months. Administer in upper arm, thigh, or abdomen   Yes Historical Provider, MD  diphenhydrAMINE (BENADRYL) 25 MG tablet Take 25 mg by mouth at bedtime as needed for itching or sleep.    Yes Historical Provider, MD  ELIQUIS 2.5 MG TABS tablet TAKE ONE TABLET BY MOUTH TWICE DAILY 10/18/16  Yes Jolaine Artist, MD  erythromycin ophthalmic ointment Place 1 application into both eyes daily.   Yes Historical Provider, MD  Ferrous Sulfate (IRON) 325 (65 Fe) MG TABS Take 1 tablet by mouth daily. 07/01/16  Yes Clanford Marisa Hua, MD  levothyroxine (SYNTHROID, LEVOTHROID) 75 MCG tablet TAKE ONE TABLET BY MOUTH ONCE DAILY 02/29/16  Yes Burnis Medin, MD  meclizine (ANTIVERT) 12.5 MG tablet  Take 1 tablet (12.5 mg total) by mouth 2 (two) times daily as needed for dizziness. 09/14/16  Yes Jolaine Artist, MD  metolazone (ZAROXOLYN) 2.5 MG tablet Take 1 tablet (2.5 mg total) by mouth as needed (for weight 134 lb or greater). 03/09/16  Yes Jolaine Artist, MD  nitroGLYCERIN (NITROSTAT) 0.4 MG SL tablet Place 1 tablet (0.4 mg total) under the tongue every 5 (five) minutes as needed. For chest pain 05/06/16  Yes Jolaine Artist, MD  oxyCODONE-acetaminophen (PERCOCET/ROXICET) 5-325 MG tablet Take 1 tablet by mouth daily as needed for pain. 06/09/16  Yes Historical Provider, MD  pantoprazole (PROTONIX) 40 MG tablet Take 40 mg by mouth daily.   Yes Historical Provider, MD  potassium chloride SA (K-DUR,KLOR-CON)  20 MEQ tablet Take 2 tablets (40 mEq total) by mouth daily. Take an additional 30meq with Metolazone. 10/04/16  Yes Jolaine Artist, MD  simvastatin (ZOCOR) 10 MG tablet TAKE ONE TABLET BY MOUTH ONCE DAILY AT BEDTIME 09/30/16  Yes Jolaine Artist, MD  torsemide (DEMADEX) 20 MG tablet Take 2 tablets (40 mg total) by mouth daily. 04/03/16  Yes Isaiah Serge, NP    Physical Exam: Vitals:   11/11/16 2115 11/11/16 2215 11/11/16 2245 11/11/16 2300  BP: (!) 108/46 94/57 100/61 100/80  Pulse: 73 69 75 70  Resp: 17 16 14 19   Temp:      TempSrc:      SpO2: 94% 94% 95% 97%  Weight:      Height:          Constitutional: NAD, calm, comfortable Vitals:   11/11/16 2115 11/11/16 2215 11/11/16 2245 11/11/16 2300  BP: (!) 108/46 94/57 100/61 100/80  Pulse: 73 69 75 70  Resp: 17 16 14 19   Temp:      TempSrc:      SpO2: 94% 94% 95% 97%  Weight:      Height:       Eyes: PERRL, lids and conjunctivae normal ENMT: Mucous membranes are dry. Posterior pharynx clear of any exudate or lesions. Neck: normal appearance, supple Respiratory: clear to auscultation bilaterally, no wheezing, no crackles. Normal respiratory effort. No accessory muscle use.  Cardiovascular: Normal rate,  regular rhythm, no murmurs / rubs / gallops. No extremity edema. 2+ pedal pulses. GI: abdomen is soft and compressible.  No distention.  No tenderness.  Bowel sounds are present. Musculoskeletal:  No joint deformity in upper and lower extremities. Good ROM, no contractures. Normal muscle tone.  Skin: no rashes, warm and dry Neurologic: CN 2-12 grossly intact. Strength symmetric bilaterally, 5/5  Psychiatric: Normal judgment and insight. Alert and oriented x 3. Normal mood.     Labs on Admission: I have personally reviewed following labs and imaging studies  CBC:  Recent Labs Lab 11/11/16 1756  WBC 6.4  HGB 15.0  HCT 44.4  MCV 91.9  PLT [id number]*   Basic Metabolic Panel:  Recent Labs Lab 11/11/16 1756  NA 136  K 2.8*  CL 93*  CO2 28  GLUCOSE 117*  BUN 57*  CREATININE 3.11*  CALCIUM 10.0   GFR: Estimated Creatinine Clearance: 9.1 mL/min (by C-G formula based on SCr of 3.11 mg/dL (H)). Liver Function Tests:  Recent Labs Lab 11/11/16 1756  AST 27  ALT 21  ALKPHOS 70  BILITOT 0.9  PROT 7.9  ALBUMIN 3.8    Recent Labs Lab 11/11/16 1756  LIPASE 28   Urine analysis:    Component Value Date/Time   COLORURINE STRAW (A) 11/11/2016 1911   APPEARANCEUR CLEAR 11/11/2016 1911   LABSPEC 1.005 11/11/2016 1911   PHURINE 5.0 11/11/2016 1911   GLUCOSEU NEGATIVE 11/11/2016 1911   GLUCOSEU NEGATIVE 12/19/2011 0956   HGBUR NEGATIVE 11/11/2016 1911   BILIRUBINUR NEGATIVE 11/11/2016 1911   BILIRUBINUR NEG 08/17/2016 1647   KETONESUR NEGATIVE 11/11/2016 1911   PROTEINUR NEGATIVE 11/11/2016 1911   UROBILINOGEN 1.0 08/17/2016 1647   UROBILINOGEN 0.2 09/10/2015 1150   NITRITE NEGATIVE 11/11/2016 1911   LEUKOCYTESUR SMALL (A) 11/11/2016 1911    Radiological Exams on Admission: Ct Abdomen Pelvis Wo Contrast  Result Date: 11/11/2016 CLINICAL DATA:  Abdominal pain.  Constipation.  Nausea up. EXAM: CT ABDOMEN AND PELVIS WITHOUT CONTRAST TECHNIQUE: Multidetector CT imaging of  the abdomen and  pelvis was performed following the standard protocol without IV contrast. COMPARISON:  05/02/2016 FINDINGS: Lower chest: Chronic scarring at the lung bases. No pleural or pericardial fluid. Hepatobiliary: Normal without contrast.  Previous cholecystectomy. Pancreas: Normal Spleen: Normal Adrenals/Urinary Tract: Adrenal glands are normal. Right kidney is normal except for 1 cm cyst at the lower pole. Left kidney contains multiple parapelvic cysts, the largest in the lower pole measuring 5 cm. Stomach/Bowel: Administered contrast has passed all the way through the small intestine and into the right colon, beginning to pass into the left colon. The patient does have a large amount of stool in the colon, but not extraordinarily so. No sign of true obstruction or focal lesion. Diverticulosis without evidence of diverticulitis. Vascular/Lymphatic: Aortic atherosclerosis. IVC is normal. No retroperitoneal mass or adenopathy. Reproductive: Negative Other: No ascites or free air. Musculoskeletal: Chronic curvature in degenerative change. IMPRESSION: No acute finding by CT. The patient does have a fairly large amount of stool in the colon, but not extraordinarily so. Administered contrast has passed well into the colon. No bowel obstruction. No acute organ pathology. Electronically Signed   By: Nelson Chimes M.D.   On: 11/11/2016 21:59   Dg Chest 2 View  Result Date: 11/11/2016 CLINICAL DATA:  Chest pain and shortness of breath. EXAM: CHEST  2 VIEW COMPARISON:  Radiographs 11/02/2016 FINDINGS: Right-sided pacemaker with leads overlying the right atrium and ventricle. There is stable cardiomegaly. Atherosclerosis of the thoracic aorta. Decreased pulmonary edema with possible minimal residual. No focal airspace disease or pleural fluid. Again seen elevation of left hemidiaphragm. Air-filled colon under the right hemidiaphragm. Surgical anchor in the right shoulder. IMPRESSION: Decreased pulmonary edema with  minimal residual. Stable cardiomegaly. Aortic atherosclerosis. Electronically Signed   By: Jeb Levering M.D.   On: 11/11/2016 21:06    EKG: Independently reviewed. Paced rhythm.  Assessment/Plan Principal Problem:   AKI (acute kidney injury) ([place]) Active Problems:   Hypothyroidism   Atrial fibrillation (HCC)   Chronic combined systolic and diastolic CHF (congestive heart failure) (HCC)   Chronic anticoagulation   Pacemaker   CKD (chronic kidney disease) stage 4, GFR 15-29 ml/min (HCC)   Hypokalemia      AKI --Hold diuretics --Gentle hydration with NS --Repeat BMP in the AM --Urine does not appear infected  Hypokalemia --Replacement ordered in the ED, 65mEq po KCl and 43mEq IV KCl --Repeat BMP in the AM  Constipation --Have RN check for impaction and provide tap water enema --Senna two tablets qHS --Miralax 17 grams po daily  Atrial fibrillation with PPM --Continue amiodarone --Anticoagulated with Eliquis  Chronic CHF --Compensated  HLD --Statin  Hypothyroidism --Levothyroxine   DVT prophylaxis: Anticoagulated with Eliquis Code Status: FULL Family Communication: daughter present in the ED at time of admission. Disposition Plan: To be determined. Consults called: NONE Admission status: Place in observation with telemetry monitoring.   TIME SPENT: 60 minutes   Eber Jones MD [facility] Pager [phone]  If 7PM-7AM, please contact night-coverage [email or web address] Password [facility]  11/11/2016, 11:29 PM

## 2016-11-11 NOTE — ED Provider Notes (Signed)
[facility] DEPT Provider Note   CSN: [id number] Arrival date & time: 11/11/16  1613     History   Chief Complaint Chief Complaint  Patient presents with  . Dizziness  . Nausea  . Constipation    HPI [name] is a 82 y.o. female.  [name] is a 82 y.o. female with h/o anemia, PAF on eliquis, diastolic CHF, dyslipidemia, HTN, hypothyroidism, STJ pacemaker presents to ED with complaint of abdominal pain. Patient reports on Tuesday morning she had an abnormal bowel movement with "pebble like" stool and reports straining during defecation, denies blood in stool. Later in the day on Tuesday patient developed diffuse abdominal cramping with radiation into her central chest. Patient thought she might be constipated and tried an off-brand of miralax and a suppository with no relief. She comes now to ED for further evaluation. Patient endorses associated nausea, lightheadedness, and dysuria. She also states she has had a 10lb weight gain over the last week and SOB. She denies fever, trouble swallowing, visual changes, cough, leg swelling, vomiting, hematuria, abdominal trauma, abdominal distension, rash, myalgias, arthralgias, or syncope. Patient denies any chronic abdominal conditions. She denies any prior history of constipation. She has had 2 c-sections. She is s/p cholecystectomy. Denies any recent dietary indiscretion.       Past Medical History:  Diagnosis Date  . Anemia   . Atrial fibrillation ([place])    a. Dx 01/2014->Eliquis started.  . Cancer ([place])   . Chest pain    a. 2011 Neg MV;  b. 01/2014 Cath: LM 20-30, LAD nl, D1 nl, LCX nl, OM1 nl, RCA dom 30-44m, PD/PL nl, EF 65%->Med Rx; c. 03/2016 MV: no ischemia/infarct. EF 63%.  . Degenerative joint disease   . Diastolic CHF, acute on chronic ([place]) 01/31/2012  . Dyslipidemia   . Dysrhythmia   . Family history of adverse reaction to anesthesia    " multiple family members have difficulty waking "  . GERD  (gastroesophageal reflux disease)   . History of skin cancer    tafeen/ non melanoma.   Marland Kitchen Hx of varicella   . Hypertension   . Hypothyroidism   . Monoclonal gammopathies   . Pacemaker   . Palpitations    PVCs/bigeminy on event in May 2009 revealing this was relatively asymptomatic  . Right lower quadrant abdominal pain 07/19/2012   Recurrent  With nausea    This is new since she had ct for llq pain in [facility]    R/o appendiceal problem  Hernia  Get ct scan  And plan  Fu     . Thrombocytopenia [facility])     Patient Active Problem List   Diagnosis Date Noted  . AKI (acute kidney injury) ([place]) 11/11/2016  . Hypokalemia 11/11/2016  . Hypertensive heart disease 04/01/2016  . PAF (paroxysmal atrial fibrillation) ([place]) 03/31/2016  . Chest pain with moderate risk for cardiac etiology, negative MI and Nuc study, + muscular skeletal   . Atypical chest pain 01/21/2016  . CKD (chronic kidney disease) stage 4, GFR 15-29 ml/min (HCC) 05/09/2015  . Anemia of chronic disease 05/09/2015  . Dyslipidemia 05/09/2015  . Iron deficiency anemia 11/21/2014  . Pacemaker 04/27/2014  . Chronic anticoagulation 02/25/2014  . Chronic combined systolic and diastolic CHF (congestive heart failure) ([place]) 02/24/2014  . Atrial fibrillation ([place]) 01/29/2014  . MGUS (monoclonal gammopathy of unknown significance) 08/01/2013  . Hypothyroidism 02/19/2012  . Hyperlipidemia 12/15/2008  . HYPERTENSION, BENIGN 12/15/2008    Past Surgical History:  Procedure Laterality Date  . CARDIOVERSION N/A 02/26/2014   Procedure: CARDIOVERSION AT BEDSIDE;  Surgeon: Pixie Casino, MD;  Location: [facility];  Service: Cardiovascular;  Laterality: N/A;  . CARDIOVERSION N/A 04/22/2014   Procedure: CARDIOVERSION (BEDSIDE);  Surgeon: [name], MD;  Location: [facility] OR;  Service: Cardiovascular;  Laterality: N/A;  . CATARACT EXTRACTION     Bilateral  implantt  . CESAREAN SECTION     times 2  . CHOLECYSTECTOMY  1999  .  ESOPHAGOGASTRODUODENOSCOPY N/A 07/06/2014   Procedure: ESOPHAGOGASTRODUODENOSCOPY (EGD);  Surgeon: Ladene Artist, MD;  Location: [facility] ENDOSCOPY;  Service: Endoscopy;  Laterality: N/A;  . INSERT / REPLACE / REMOVE PACEMAKER    . LEFT HEART CATHETERIZATION WITH CORONARY ANGIOGRAM N/A 01/29/2014   Procedure: LEFT HEART CATHETERIZATION WITH CORONARY ANGIOGRAM;  Surgeon: Blane Ohara, MD;  Location: [facility] CATH LAB;  Service: Cardiovascular;  Laterality: N/A;  . PACEMAKER INSERTION  04/23/2014   STJ Assurity dual chamber pacemaker implanted by Dr Rayann Heman  . PERMANENT PACEMAKER INSERTION N/A 04/23/2014   Procedure: PERMANENT PACEMAKER INSERTION;  Surgeon: Evans Lance, MD;  Location: [facility] CATH LAB;  Service: Cardiovascular;  Laterality: N/A;  . RIGHT HEART CATHETERIZATION N/A 09/22/2014   Procedure: RIGHT HEART CATH;  Surgeon: Jolaine Artist, MD;  Location: [facility] CATH LAB;  Service: Cardiovascular;  Laterality: N/A;  . SHOULDER SURGERY  1996   Right     OB History    No data available       Home Medications    Prior to Admission medications   Medication Sig Start Date End Date Taking? Authorizing Provider  amiodarone (PACERONE) 200 MG tablet TAKE ONE TABLET BY MOUTH ONCE DAILY 11/10/16  Yes Evans Lance, MD  denosumab (PROLIA) 60 MG/ML SOLN injection Inject 60 mg into the skin every 6 (six) months. Administer in upper arm, thigh, or abdomen   Yes Historical Provider, MD  diphenhydrAMINE (BENADRYL) 25 MG tablet Take 25 mg by mouth at bedtime as needed for itching or sleep.    Yes Historical Provider, MD  ELIQUIS 2.5 MG TABS tablet TAKE ONE TABLET BY MOUTH TWICE DAILY 10/18/16  Yes Jolaine Artist, MD  erythromycin ophthalmic ointment Place 1 application into both eyes daily.   Yes Historical Provider, MD  Ferrous Sulfate (IRON) 325 (65 Fe) MG TABS Take 1 tablet by mouth daily. 07/01/16  Yes Clanford Marisa Hua, MD  levothyroxine (SYNTHROID, LEVOTHROID) 75 MCG tablet TAKE ONE TABLET BY MOUTH ONCE  DAILY 02/29/16  Yes Burnis Medin, MD  meclizine (ANTIVERT) 12.5 MG tablet Take 1 tablet (12.5 mg total) by mouth 2 (two) times daily as needed for dizziness. 09/14/16  Yes Jolaine Artist, MD  metolazone (ZAROXOLYN) 2.5 MG tablet Take 1 tablet (2.5 mg total) by mouth as needed (for weight 134 lb or greater). 03/09/16  Yes Jolaine Artist, MD  nitroGLYCERIN (NITROSTAT) 0.4 MG SL tablet Place 1 tablet (0.4 mg total) under the tongue every 5 (five) minutes as needed. For chest pain 05/06/16  Yes Jolaine Artist, MD  oxyCODONE-acetaminophen (PERCOCET/ROXICET) 5-325 MG tablet Take 1 tablet by mouth daily as needed for pain. 06/09/16  Yes Historical Provider, MD  pantoprazole (PROTONIX) 40 MG tablet Take 40 mg by mouth daily.   Yes Historical Provider, MD  potassium chloride SA (K-DUR,KLOR-CON) 20 MEQ tablet Take 2 tablets (40 mEq total) by mouth daily. Take an additional 12meq with Metolazone. 10/04/16  Yes Jolaine Artist, MD  simvastatin (ZOCOR) 10  MG tablet TAKE ONE TABLET BY MOUTH ONCE DAILY AT BEDTIME 09/30/16  Yes Jolaine Artist, MD  torsemide (DEMADEX) 20 MG tablet Take 2 tablets (40 mg total) by mouth daily. 04/03/16  Yes Isaiah Serge, NP    Family History Family History  Problem Relation Age of Onset  . Coronary artery disease Father   . Heart disease Father   . Heart attack Father   . Alzheimer's disease Mother   . Lung cancer Brother 43  . Lung cancer      Social History Social History  Substance Use Topics  . Smoking status: Never Smoker  . Smokeless tobacco: Never Used  . Alcohol use No     Allergies   Other; Tramadol; and Ibuprofen   Review of Systems Review of Systems  Constitutional: Positive for unexpected weight change. Negative for chills, diaphoresis and fever.  HENT: Negative for trouble swallowing.   Eyes: Negative for visual disturbance.  Respiratory: Positive for shortness of breath. Negative for cough.   Cardiovascular: Positive for chest  pain. Negative for leg swelling.  Gastrointestinal: Positive for abdominal pain, constipation and nausea. Negative for blood in stool, diarrhea and vomiting.  Genitourinary: Positive for dysuria. Negative for hematuria.  Musculoskeletal: Negative for arthralgias and myalgias.  Skin: Negative for rash.  Neurological: Positive for light-headedness. Negative for syncope.     Physical Exam Updated Vital Signs BP 129/62 (BP Location: Left Arm)   Pulse 70   Temp 97.9 F (36.6 C) (Oral)   Resp 14   Ht 4\' 10"  (1.473 m)   Wt 56.7 kg   SpO2 97%   BMI 26.13 kg/m   Physical Exam  Constitutional: She appears well-developed and well-nourished. No distress.  HENT:  Head: Normocephalic and atraumatic.  Mouth/Throat: Oropharynx is clear and moist. Mucous membranes are dry. No oropharyngeal exudate.  Eyes: Conjunctivae and EOM are normal. Pupils are equal, round, and reactive to light. Right eye exhibits no discharge. Left eye exhibits no discharge. No scleral icterus.  Neck: Normal range of motion and phonation normal. Neck supple. No JVD present. No neck rigidity. Normal range of motion present.  No JVD.   Cardiovascular: Normal rate, regular rhythm, normal heart sounds and intact distal pulses.   No murmur heard. Pulmonary/Chest: Effort normal and breath sounds normal. No stridor. No respiratory distress. She has no wheezes. She has no rales.  Symmetric chest expansion. Respirations unlabored. No wheezing or rales. No hypoxia.   Abdominal: Soft. Bowel sounds are normal. She exhibits no distension. There is generalized tenderness. There is no rigidity, no rebound, no guarding and no CVA tenderness.  Diffuse abdominal tenderness without rebound, guarding, or rigidity. Abdomen is soft.   Musculoskeletal: Normal range of motion.  No appreciable lower extremity swelling.   Lymphadenopathy:    She has no cervical adenopathy.  Neurological: She is alert. She is not disoriented. Coordination and  gait normal. GCS eye subscore is 4. GCS verbal subscore is 5. GCS motor subscore is 6.  Skin: Skin is warm and dry. She is not diaphoretic.  Psychiatric: She has a normal mood and affect. Her behavior is normal.     ED Treatments / Results  Labs (all labs ordered are listed, but only abnormal results are displayed) Labs Reviewed  COMPREHENSIVE METABOLIC PANEL - Abnormal; Notable for the following:       Result Value   Potassium 2.8 (*)    Chloride 93 (*)    Glucose, Bld 117 (*)    BUN  57 (*)    Creatinine, Ser 3.11 (*)    GFR calc non Af Amer 12 (*)    GFR calc Af Amer 14 (*)    All other components within normal limits  CBC - Abnormal; Notable for the following:    Platelets 141 (*)    All other components within normal limits  URINALYSIS, ROUTINE W REFLEX MICROSCOPIC - Abnormal; Notable for the following:    Color, Urine STRAW (*)    Leukocytes, UA SMALL (*)    All other components within normal limits  LIPASE, BLOOD  BRAIN NATRIURETIC PEPTIDE  I-STAT TROPOININ, ED    EKG  EKG Interpretation  Date/Time:  Friday November 11 2016 17:56:53 EST Ventricular Rate:  72 PR Interval:  304 QRS Duration: 188 QT Interval:  524 QTC Calculation: 573 R Axis:   138 Text Interpretation:  Atrial-sensed ventricular-paced rhythm with prolonged AV conduction with occasional sinus complexes Abnormal ECG Confirmed by [facility] MD, PEDRO ([id number]) on 11/11/2016 6:39:19 PM       Radiology Ct Abdomen Pelvis Wo Contrast  Result Date: 11/11/2016 CLINICAL DATA:  Abdominal pain.  Constipation.  Nausea up. EXAM: CT ABDOMEN AND PELVIS WITHOUT CONTRAST TECHNIQUE: Multidetector CT imaging of the abdomen and pelvis was performed following the standard protocol without IV contrast. COMPARISON:  05/02/2016 FINDINGS: Lower chest: Chronic scarring at the lung bases. No pleural or pericardial fluid. Hepatobiliary: Normal without contrast.  Previous cholecystectomy. Pancreas: Normal Spleen: Normal  Adrenals/Urinary Tract: Adrenal glands are normal. Right kidney is normal except for 1 cm cyst at the lower pole. Left kidney contains multiple parapelvic cysts, the largest in the lower pole measuring 5 cm. Stomach/Bowel: Administered contrast has passed all the way through the small intestine and into the right colon, beginning to pass into the left colon. The patient does have a large amount of stool in the colon, but not extraordinarily so. No sign of true obstruction or focal lesion. Diverticulosis without evidence of diverticulitis. Vascular/Lymphatic: Aortic atherosclerosis. IVC is normal. No retroperitoneal mass or adenopathy. Reproductive: Negative Other: No ascites or free air. Musculoskeletal: Chronic curvature in degenerative change. IMPRESSION: No acute finding by CT. The patient does have a fairly large amount of stool in the colon, but not extraordinarily so. Administered contrast has passed well into the colon. No bowel obstruction. No acute organ pathology. Electronically Signed   By: Nelson Chimes M.D.   On: 11/11/2016 21:59   Dg Chest 2 View  Result Date: 11/11/2016 CLINICAL DATA:  Chest pain and shortness of breath. EXAM: CHEST  2 VIEW COMPARISON:  Radiographs 11/02/2016 FINDINGS: Right-sided pacemaker with leads overlying the right atrium and ventricle. There is stable cardiomegaly. Atherosclerosis of the thoracic aorta. Decreased pulmonary edema with possible minimal residual. No focal airspace disease or pleural fluid. Again seen elevation of left hemidiaphragm. Air-filled colon under the right hemidiaphragm. Surgical anchor in the right shoulder. IMPRESSION: Decreased pulmonary edema with minimal residual. Stable cardiomegaly. Aortic atherosclerosis. Electronically Signed   By: Jeb Levering M.D.   On: 11/11/2016 21:06    Procedures Procedures (including critical care time)  Medications Ordered in ED Medications  iopamidol (ISOVUE-300) 61 % injection (not administered)    apixaban (ELIQUIS) tablet 2.5 mg (2.5 mg Oral Given 11/12/16 0128)  potassium chloride SA (K-DUR,KLOR-CON) CR tablet 40 mEq (40 mEq Oral Given 11/11/16 2053)  potassium chloride 30 mEq in sodium chloride 0.9 % 265 mL (KCL MULTIRUN) IVPB (30 mEq Intravenous Given 11/11/16 2108)     Initial Impression /  Assessment and Plan / ED Course  I have reviewed the triage vital signs and the nursing notes.  Pertinent labs & imaging results that were available during my care of the patient were reviewed by me and considered in my medical decision making (see chart for details).  Clinical Course as of Nov 13 155  Fri Nov 11, 2016  2130 DG Chest 2 View [AM]  2230 CT Abdomen Pelvis Wo Contrast [AM]    Clinical Course User Index [AM] Roxanna Mew, PA-C   Patient presents to ED with complaint of abdominal pain, lightheadedness, chest pain w/ SOB and weight gain. Patient is afebrile and non-toxic appearing in NAD. VSS. Heart RRR. Lungs CTABL. No overt lower extremity swelling. No JVD. Mucous membrane appear dry. Abdomen is soft and diffusely tender on exam. Lipase nml - doubt pancreatitis. CBC grossly re-assuring. CMP remarkable for hypokalemia, hypochloremia, and AKI. Potassium repletion initiated. ?Abdominal pain secondary to ileus from hypokalemia vs. Constipation vs. New mass. Initial troponin negative. EKG shows atrial-sensed ventricular-paced rhythm with prolonged AV conduction with occasional sinus complexes. Given CP, SOB, and weight gain BNP ordered and CXR ordered. Given change in bowel habits CT abd/pelvis ordered. Will obtain orthostatic vital signs. Given hypokalemia and AKI pt likely will need admission for further management. Discussed pt with Dr. Leonette Monarch, agrees with plan.  BNP mildly elevated. Mildly orthostatic. CT abd/pelvis shows no acute findings; large amount of stool noted bu no true obstruction or lesion, diverticulosis w/o diverticulitis present - ?sxs secondary to constipation.  Consult to hospitalist placed.   Spoke with Dr. Eulas Post of [facility], greatly appreciate her time and input, agree to admit patient for further management of AKI and hypokalemia. Thank you for your continued care of this patient.       Final Clinical Impressions(s) / ED Diagnoses   Final diagnoses:  Acute kidney injury ([place])  Hypokalemia  Generalized abdominal pain    New Prescriptions New Prescriptions   No medications on file     Roxanna Mew, PA-C 11/12/16 [place], MD 11/15/16 [phone]

## 2016-11-11 NOTE — ED Notes (Signed)
Patient transported to CT 

## 2016-11-11 NOTE — Telephone Encounter (Signed)
Per chart pt has not checked in to ED yet. Called and spoke with pt. She states that her heart rate has been as high as 120's, she has pacemaker that is set for 70's. She thinks she has impacted stool, she is very dizzy and has lost weight. Advised pt that she needs to be seen and evaluated in ED as soon as possible. Pt states that she does have someone who can take her to ED, she will be going to [facility] ED.

## 2016-11-11 NOTE — ED Notes (Signed)
Patient transported to X-ray 

## 2016-11-11 NOTE — ED Triage Notes (Addendum)
Pt here for constipation and has been taking laxative OTC and making her dizzy and lightheaded, nauseated. Last BM was Tuesday. Reports she has had CP and her HR went up to 128. Reports lower abdominal pain.

## 2016-11-11 NOTE — Telephone Encounter (Signed)
[facility] Patient Name: [name] N DOB: 12-10-1924 Initial Comment Caller states has dizziness; lost 10 lbs in wk. mouth feels like straw; nauseaus; Derinda Sis; Nurse Assessment Nurse: Dimas Chyle, RN, Dellis Filbert Date/Time [name] Time): 11/11/2016 9:08:32 AM Confirm and document reason for call. If symptomatic, describe symptoms. ---Caller states has dizziness; lost 10 lbs in wk. mouth feels like straw; nauseous; Began Wednesday. Last urine was this morning. Does the patient have any new or worsening symptoms? ---Yes Will a triage be completed? ---Yes Related visit to physician within the last 2 weeks? ---No Does the PT have any chronic conditions? (i.e. diabetes, asthma, etc.) ---Yes List chronic conditions. ---HTN Is this a behavioral health or substance abuse call? ---No Guidelines Guideline Title Affirmed Question Affirmed Notes Dizziness - Vertigo [1] Dizziness (vertigo) present now AND [2] age > 34 (Exception: prior physician evaluation for this AND no different/worse than usual) Final Disposition User Go to ED Now (or PCP triage) Dimas Chyle, RN, [facility] - ED Disagree/Comply: Comply

## 2016-11-11 NOTE — ED Notes (Signed)
Patient brought back to room prior to xray being performed d/t patient c/o needing to urinate. Patient will be taken back to xray afterwards.

## 2016-11-12 ENCOUNTER — Encounter (HOSPITAL_COMMUNITY): Payer: Self-pay | Admitting: *Deleted

## 2016-11-12 DIAGNOSIS — K5900 Constipation, unspecified: Secondary | ICD-10-CM | POA: Diagnosis not present

## 2016-11-12 DIAGNOSIS — N179 Acute kidney failure, unspecified: Secondary | ICD-10-CM | POA: Diagnosis not present

## 2016-11-12 DIAGNOSIS — E876 Hypokalemia: Secondary | ICD-10-CM | POA: Diagnosis not present

## 2016-11-12 DIAGNOSIS — I480 Paroxysmal atrial fibrillation: Secondary | ICD-10-CM | POA: Diagnosis not present

## 2016-11-12 LAB — CBC
HEMATOCRIT: 41 % (ref 36.0–46.0)
Hemoglobin: 13.7 g/dL (ref 12.0–15.0)
MCH: 31.1 pg (ref 26.0–34.0)
MCHC: 33.4 g/dL (ref 30.0–36.0)
MCV: 93.2 fL (ref 78.0–100.0)
Platelets: 142 10*3/uL — ABNORMAL LOW (ref 150–400)
RBC: 4.4 MIL/uL (ref 3.87–5.11)
RDW: 13.7 % (ref 11.5–15.5)
WBC: 5 10*3/uL (ref 4.0–10.5)

## 2016-11-12 LAB — BASIC METABOLIC PANEL
Anion gap: 12 (ref 5–15)
BUN: 56 mg/dL — AB (ref 6–20)
CALCIUM: 9.2 mg/dL (ref 8.9–10.3)
CO2: 28 mmol/L (ref 22–32)
CREATININE: 2.78 mg/dL — AB (ref 0.44–1.00)
Chloride: 97 mmol/L — ABNORMAL LOW (ref 101–111)
GFR calc non Af Amer: 14 mL/min — ABNORMAL LOW (ref >60)
GFR, EST AFRICAN AMERICAN: 16 mL/min — AB (ref >60)
GLUCOSE: 98 mg/dL (ref 65–99)
Potassium: 3 mmol/L — ABNORMAL LOW (ref 3.5–5.1)
Sodium: 137 mmol/L (ref 135–145)

## 2016-11-12 LAB — BRAIN NATRIURETIC PEPTIDE: B Natriuretic Peptide: 264.3 pg/mL — ABNORMAL HIGH (ref 0.0–100.0)

## 2016-11-12 MED ORDER — SENNA 8.6 MG PO TABS
2.0000 | ORAL_TABLET | Freq: Every day | ORAL | Status: DC
  Administered 2016-11-12: 17.2 mg via ORAL
  Filled 2016-11-12 (×2): qty 2

## 2016-11-12 MED ORDER — SODIUM CHLORIDE 0.9% FLUSH
3.0000 mL | Freq: Two times a day (BID) | INTRAVENOUS | Status: DC
  Administered 2016-11-12 – 2016-11-13 (×2): 3 mL via INTRAVENOUS

## 2016-11-12 MED ORDER — ACETAMINOPHEN 650 MG RE SUPP: 650.0000 mg | Freq: Four times a day (QID) | RECTAL | Status: DC | PRN

## 2016-11-12 MED ORDER — POLYETHYLENE GLYCOL 3350 17 G PO PACK
17.0000 g | PACK | Freq: Every day | ORAL | Status: DC
  Administered 2016-11-13: 17 g via ORAL
  Filled 2016-11-12 (×2): qty 1

## 2016-11-12 MED ORDER — ERYTHROMYCIN 5 MG/GM OP OINT
1.0000 "application " | TOPICAL_OINTMENT | Freq: Every day | OPHTHALMIC | Status: DC
  Administered 2016-11-12 – 2016-11-13 (×2): 1 via OPHTHALMIC
  Filled 2016-11-12 (×2): qty 3.5

## 2016-11-12 MED ORDER — SODIUM CHLORIDE 0.9 % IV SOLN
Freq: Once | INTRAVENOUS | Status: AC
  Administered 2016-11-12: 13:00:00 via INTRAVENOUS
  Filled 2016-11-12: qty 1000

## 2016-11-12 MED ORDER — PANTOPRAZOLE SODIUM 40 MG PO TBEC
40.0000 mg | DELAYED_RELEASE_TABLET | Freq: Every day | ORAL | Status: DC
  Administered 2016-11-12 – 2016-11-13 (×2): 40 mg via ORAL
  Filled 2016-11-12 (×2): qty 1

## 2016-11-12 MED ORDER — ONDANSETRON HCL 4 MG/2ML IJ SOLN: 4.0000 mg | Freq: Four times a day (QID) | INTRAMUSCULAR | Status: DC | PRN

## 2016-11-12 MED ORDER — ACETAMINOPHEN 325 MG PO TABS
650.0000 mg | ORAL_TABLET | Freq: Four times a day (QID) | ORAL | Status: DC | PRN
Start: 2016-11-12 — End: 2016-11-13

## 2016-11-12 MED ORDER — SIMVASTATIN 10 MG PO TABS
10.0000 mg | ORAL_TABLET | Freq: Every day | ORAL | Status: DC
  Administered 2016-11-12 – 2016-11-13 (×2): 10 mg via ORAL
  Filled 2016-11-12 (×2): qty 1

## 2016-11-12 MED ORDER — APIXABAN 2.5 MG PO TABS
2.5000 mg | ORAL_TABLET | Freq: Two times a day (BID) | ORAL | Status: DC
  Administered 2016-11-12 – 2016-11-13 (×4): 2.5 mg via ORAL
  Filled 2016-11-12 (×6): qty 1

## 2016-11-12 MED ORDER — AMIODARONE HCL 200 MG PO TABS
200.0000 mg | ORAL_TABLET | Freq: Every day | ORAL | Status: DC
  Administered 2016-11-12 – 2016-11-13 (×2): 200 mg via ORAL
  Filled 2016-11-12 (×2): qty 1

## 2016-11-12 MED ORDER — APIXABAN 2.5 MG PO TABS
2.5000 mg | ORAL_TABLET | Freq: Two times a day (BID) | ORAL | Status: DC
Start: 2016-11-12 — End: 2016-11-12

## 2016-11-12 MED ORDER — LEVOTHYROXINE SODIUM 75 MCG PO TABS
75.0000 ug | ORAL_TABLET | Freq: Every day | ORAL | Status: DC
  Administered 2016-11-12 – 2016-11-13 (×2): 75 ug via ORAL
  Filled 2016-11-12 (×2): qty 1

## 2016-11-12 MED ORDER — ONDANSETRON HCL 4 MG PO TABS: 4.0000 mg | ORAL_TABLET | Freq: Four times a day (QID) | ORAL | Status: DC | PRN

## 2016-11-12 MED ORDER — SODIUM CHLORIDE 0.9 % IV SOLN
INTRAVENOUS | Status: DC
  Administered 2016-11-12: 07:00:00 via INTRAVENOUS

## 2016-11-12 MED ORDER — OXYCODONE-ACETAMINOPHEN 5-325 MG PO TABS
1.0000 | ORAL_TABLET | Freq: Every day | ORAL | Status: DC | PRN
  Administered 2016-11-13: 1 via ORAL
  Filled 2016-11-12: qty 1

## 2016-11-12 NOTE — ED Notes (Signed)
Pt insisted on ambulating to restroom. Family member states pt uses cane to ambulate at home. 2 staff ambulated w/ pt. Pt moderately unsteady while ambulating but insisted on continuing to ambulate. Refused wheelchair and bedside commode.

## 2016-11-12 NOTE — ED Notes (Signed)
Attempted report x1. RN on [facility] to callback this RN.

## 2016-11-12 NOTE — ED Notes (Signed)
Holding to give report to [facility].

## 2016-11-12 NOTE — ED Notes (Addendum)
BSC provided to patient. Patient had BM and urinated in [facility]. Reports that she feels better.

## 2016-11-12 NOTE — Progress Notes (Signed)
PROGRESS NOTE [facility]   [name]   [id number] DOB: Nov 16, 1924  DOA: 11/11/2016 PCP: [name], MD   Brief Narrative:   [name] is a 82 y.o. woman with a history of HTN, HLD, paroxysmal atrial fibrillation S/P PPM (CHADS-Vasc score of 5, anticoagulated with Eliquis), chronic diastolic heart failure, hypothyroidism, CKD 3 (baseline creatinine around 2.2), and MGUS who presents to the ED for evaluation of 3-4 days of constipation and nausea.  She has abdominal pain "across her entire abdomen" that she associates with straining for her bowel movements. Patient admitted for AKI and hypokalemia.   Subjective: Patient seen and examined, having lunch at bedside. Have no complaints. Had a bowel movement this morning. Creatinine improving with IV fluids   Assessment & Plan: Acute on chronic kidney disease stage IV - likely secondary to poor oral intake - improving with IV fluids BMP in the morning Continue gentle IV hydration. If creatinine continues to improve will discharge  Hypokalemia Replete  Constipation - have multiple bowel movements today - resolved Likely secondary to poor oral intake and poor mobility Continue senna and MiraLAX  A. Fib w/ PPM heart rate well controlled Continue amiodarone Continue Eliquis  Diastolic CHF compensated Continue home medication  No signs of fluid overload.   DVT prophylaxis: Eliquis  Code Status: Full  Family Communication: None at bedside  Disposition Plan: If creatinine continues to improve will discharge tomorrow in the morning  Consultants:  None  Procedures:   None  Antimicrobials:  None  Objective: Vitals:   11/12/16 1100 11/12/16 1245 11/12/16 1330 11/12/16 1500  BP: 108/61 109/69 104/77 124/88  Pulse: 70 70 77 77  Resp: 17 20 19 17   Temp:      TempSrc:      SpO2: 97% 97% 100% 97%  Weight:      Height:        Intake/Output Summary (Last 24 hours) at 11/12/16 1545 Last  data filed at 11/12/16 1257  Gross per 24 hour  Intake              250 ml  Output                0 ml  Net              250 ml   Filed Weights   11/11/16 1746  Weight: 56.7 kg (125 lb)    Examination:  General exam: Appears calm and comfortable  HEENT: AC/AT, PERRLA, OP moist and clear Respiratory system: Clear to auscultation. No wheezes,crackle or rhonchi Cardiovascular system: S1 & S2 heard, RRR. No JVD, murmurs, rubs or gallops Gastrointestinal system: Abdomen is nondistended, soft and nontender. No organomegaly or masses felt. Normal bowel sounds heard. Central nervous system: Alert and oriented. No focal neurological deficits. Extremities: No pedal edema. Symmetric, strength 5/5   Skin: No rashes, lesions or ulcers Psychiatry: Judgement and insight appear normal. Mood & affect appropriate.    Data Reviewed: I have personally reviewed following labs and imaging studies  CBC:  Recent Labs Lab 11/11/16 1756 11/12/16 0429  WBC 6.4 5.0  HGB 15.0 13.7  HCT 44.4 41.0  MCV 91.9 93.2  PLT 141* [id number]*   Basic Metabolic Panel:  Recent Labs Lab 11/11/16 1756 11/12/16 0429  NA 136 137  K 2.8* 3.0*  CL 93* 97*  CO2 28 28  GLUCOSE 117* 98  BUN 57* 56*  CREATININE 3.11* 2.78*  CALCIUM 10.0 9.2   GFR: Estimated Creatinine  Clearance: 10.2 mL/min (by C-G formula based on SCr of 2.78 mg/dL (H)). Liver Function Tests:  Recent Labs Lab 11/11/16 1756  AST 27  ALT 21  ALKPHOS 70  BILITOT 0.9  PROT 7.9  ALBUMIN 3.8    Recent Labs Lab 11/11/16 1756  LIPASE 28   No results for input(s): AMMONIA in the last 168 hours. Coagulation Profile: No results for input(s): INR, PROTIME in the last 168 hours. Cardiac Enzymes: No results for input(s): CKTOTAL, CKMB, CKMBINDEX, TROPONINI in the last 168 hours. BNP (last 3 results) No results for input(s): PROBNP in the last 8760 hours. HbA1C: No results for input(s): HGBA1C in the last 72 hours. CBG: No results for  input(s): GLUCAP in the last 168 hours. Lipid Profile: No results for input(s): CHOL, HDL, LDLCALC, TRIG, CHOLHDL, LDLDIRECT in the last 72 hours. Thyroid Function Tests: No results for input(s): TSH, T4TOTAL, FREET4, T3FREE, THYROIDAB in the last 72 hours. Anemia Panel: No results for input(s): VITAMINB12, FOLATE, FERRITIN, TIBC, IRON, RETICCTPCT in the last 72 hours. Sepsis Labs: No results for input(s): PROCALCITON, LATICACIDVEN in the last 168 hours.  No results found for this or any previous visit (from the past 240 hour(s)).    Radiology Studies: Ct Abdomen Pelvis Wo Contrast  Result Date: 11/11/2016 CLINICAL DATA:  Abdominal pain.  Constipation.  Nausea up. EXAM: CT ABDOMEN AND PELVIS WITHOUT CONTRAST TECHNIQUE: Multidetector CT imaging of the abdomen and pelvis was performed following the standard protocol without IV contrast. COMPARISON:  05/02/2016 FINDINGS: Lower chest: Chronic scarring at the lung bases. No pleural or pericardial fluid. Hepatobiliary: Normal without contrast.  Previous cholecystectomy. Pancreas: Normal Spleen: Normal Adrenals/Urinary Tract: Adrenal glands are normal. Right kidney is normal except for 1 cm cyst at the lower pole. Left kidney contains multiple parapelvic cysts, the largest in the lower pole measuring 5 cm. Stomach/Bowel: Administered contrast has passed all the way through the small intestine and into the right colon, beginning to pass into the left colon. The patient does have a large amount of stool in the colon, but not extraordinarily so. No sign of true obstruction or focal lesion. Diverticulosis without evidence of diverticulitis. Vascular/Lymphatic: Aortic atherosclerosis. IVC is normal. No retroperitoneal mass or adenopathy. Reproductive: Negative Other: No ascites or free air. Musculoskeletal: Chronic curvature in degenerative change. IMPRESSION: No acute finding by CT. The patient does have a fairly large amount of stool in the colon, but not  extraordinarily so. Administered contrast has passed well into the colon. No bowel obstruction. No acute organ pathology. Electronically Signed   By: Nelson Chimes M.D.   On: 11/11/2016 21:59   Dg Chest 2 View  Result Date: 11/11/2016 CLINICAL DATA:  Chest pain and shortness of breath. EXAM: CHEST  2 VIEW COMPARISON:  Radiographs 11/02/2016 FINDINGS: Right-sided pacemaker with leads overlying the right atrium and ventricle. There is stable cardiomegaly. Atherosclerosis of the thoracic aorta. Decreased pulmonary edema with possible minimal residual. No focal airspace disease or pleural fluid. Again seen elevation of left hemidiaphragm. Air-filled colon under the right hemidiaphragm. Surgical anchor in the right shoulder. IMPRESSION: Decreased pulmonary edema with minimal residual. Stable cardiomegaly. Aortic atherosclerosis. Electronically Signed   By: Jeb Levering M.D.   On: 11/11/2016 21:06     Scheduled Meds: . amiodarone  200 mg Oral Daily  . apixaban  2.5 mg Oral BID  . erythromycin  1 application Both Eyes Daily  . levothyroxine  75 mcg Oral QAC breakfast  . pantoprazole  40 mg Oral  Daily  . polyethylene glycol  17 g Oral Daily  . senna  2 tablet Oral QHS  . simvastatin  10 mg Oral Q breakfast  . sodium chloride flush  3 mL Intravenous Q12H   Continuous Infusions:   LOS: 0 days     Chipper Oman, MD [facility] Pager [phone]  If 7PM-7AM, please contact night-coverage [email or web address] Password [email or web address] 11/12/2016, 3:45 PM

## 2016-11-12 NOTE — ED Notes (Addendum)
Discussed enema order with patient. Patient reports she has been taking laxatives at home, and feels that she may have a bowel movement soon. Requests bedside commode. Patient states that she prefers to see if she has BM without enema.

## 2016-11-12 NOTE — ED Notes (Addendum)
Patient assisted to restroom by EMT. No distress noted.

## 2016-11-13 DIAGNOSIS — I480 Paroxysmal atrial fibrillation: Secondary | ICD-10-CM | POA: Diagnosis not present

## 2016-11-13 DIAGNOSIS — E876 Hypokalemia: Secondary | ICD-10-CM | POA: Diagnosis not present

## 2016-11-13 DIAGNOSIS — N179 Acute kidney failure, unspecified: Secondary | ICD-10-CM | POA: Diagnosis not present

## 2016-11-13 DIAGNOSIS — K5900 Constipation, unspecified: Secondary | ICD-10-CM | POA: Diagnosis not present

## 2016-11-13 LAB — BASIC METABOLIC PANEL
ANION GAP: 6 (ref 5–15)
BUN: 38 mg/dL — ABNORMAL HIGH (ref 6–20)
CALCIUM: 9.5 mg/dL (ref 8.9–10.3)
CO2: 29 mmol/L (ref 22–32)
CREATININE: 2.22 mg/dL — AB (ref 0.44–1.00)
Chloride: 107 mmol/L (ref 101–111)
GFR calc Af Amer: 21 mL/min — ABNORMAL LOW (ref >60)
GFR, EST NON AFRICAN AMERICAN: 18 mL/min — AB (ref >60)
GLUCOSE: 104 mg/dL — AB (ref 65–99)
Potassium: 4.6 mmol/L (ref 3.5–5.1)
Sodium: 142 mmol/L (ref 135–145)

## 2016-11-13 MED ORDER — SENNA 8.6 MG PO TABS: 2.0000 | ORAL_TABLET | Freq: Every day | ORAL | 0 refills | Status: DC

## 2016-11-13 NOTE — Discharge Instructions (Signed)

## 2016-11-13 NOTE — Discharge Summary (Signed)
[name] to be D/C'd Home per MD order. Discussed with the patient and all questions fully answered.  Allergies as of 11/13/2016      Reactions   Other Shortness Of Breath   Detergents, perfumes and other strong odor emitting compounds   Tramadol Shortness Of Breath, Nausea Only   Ibuprofen Other (See Comments)   Told not to take med      Medication List    STOP taking these medications   diphenhydrAMINE 25 MG tablet Commonly known as:  BENADRYL     TAKE these medications   amiodarone 200 MG tablet Commonly known as:  PACERONE TAKE ONE TABLET BY MOUTH ONCE DAILY   denosumab 60 MG/ML Soln injection Commonly known as:  PROLIA Inject 60 mg into the skin every 6 (six) months. Administer in upper arm, thigh, or abdomen   ELIQUIS 2.5 MG Tabs tablet Generic drug:  apixaban TAKE ONE TABLET BY MOUTH TWICE DAILY   erythromycin ophthalmic ointment Place 1 application into both eyes daily.   Iron 325 (65 Fe) MG Tabs Take 1 tablet by mouth daily.   levothyroxine 75 MCG tablet Commonly known as:  SYNTHROID, LEVOTHROID TAKE ONE TABLET BY MOUTH ONCE DAILY   meclizine 12.5 MG tablet Commonly known as:  ANTIVERT Take 1 tablet (12.5 mg total) by mouth 2 (two) times daily as needed for dizziness.   metolazone 2.5 MG tablet Commonly known as:  ZAROXOLYN Take 1 tablet (2.5 mg total) by mouth as needed (for weight 134 lb or greater).   nitroGLYCERIN 0.4 MG SL tablet Commonly known as:  NITROSTAT Place 1 tablet (0.4 mg total) under the tongue every 5 (five) minutes as needed. For chest pain   oxyCODONE-acetaminophen 5-325 MG tablet Commonly known as:  PERCOCET/ROXICET Take 1 tablet by mouth daily as needed for pain.   pantoprazole 40 MG tablet Commonly known as:  PROTONIX Take 40 mg by mouth daily.   potassium chloride SA 20 MEQ tablet Commonly known as:  K-DUR,KLOR-CON Take 2 tablets (40 mEq total) by mouth daily. Take an additional 59meq with Metolazone.   senna  8.6 MG Tabs tablet Commonly known as:  SENOKOT Take 2 tablets (17.2 mg total) by mouth at bedtime.   simvastatin 10 MG tablet Commonly known as:  ZOCOR TAKE ONE TABLET BY MOUTH ONCE DAILY AT BEDTIME   torsemide 20 MG tablet Commonly known as:  DEMADEX Take 2 tablets (40 mg total) by mouth daily.       VVS, Skin clean, dry and intact without evidence of skin break down, no evidence of skin tears noted.  IV catheter discontinued intact. Site without signs and symptoms of complications. Dressing and pressure applied.  An After Visit Summary was printed and given to the patient.  Patient escorted via [place], and D/C home via private auto.  [name]  11/13/2016 3:59 PM

## 2016-11-13 NOTE — Discharge Summary (Signed)
Physician Discharge Summary  [name]  [id number]  DOB: 1925-07-12  DOA: 11/11/2016 PCP: [name], MD  Admit date: 11/11/2016 Discharge date: 11/13/2016  Admitted From: Home  Disposition:  Home   Recommendations for Outpatient Follow-up:  1. Follow up with PCP in 1-2 weeks 2. Please obtain BMP/CBC in one week 3. Please follow up on the following pending results:  Discharge Condition: Stable  CODE STATUS: FULL  Diet recommendation: Heart Healthy   Brief/Interim Summary: Linda A McMullenis a 82 y.o.woman with a history of HTN, HLD, paroxysmal atrial fibrillation S/P PPM (CHADS-Vasc score of 5, anticoagulated with Eliquis), chronic diastolic heart failure, hypothyroidism, CKD 3 (baseline creatinine around 2.2), and MGUS who presents to the ED for evaluation of 3-4 days of constipation and nausea. She has abdominal pain "across her entire abdomen" that she associates with straining for her bowel movements. Patient admitted for AKI and hypokalemia.   Subjective: Patient seen and examined. Doing well this morning continues to have BM Cr continues to improve. K normal. Patient have no complaints. Afebrile, no acute events overnight.   Discharge Diagnoses/Hospital course:  Acute on chronic kidney disease stage IV - likely secondary to poor oral intake - improving with IV fluids Responded to gentle hydration  Cr back to baseline 2.2 Encourage oral hydration  Follow up with PCP in 1 week to get BMP   Hypokalemia - Resolved  Continue Kdur at home would increase to 60 mEq daily given patient on torsemide and metolazone   Constipation - have multiple bowel movements today - resolved Likely secondary to poor oral intake and poor mobility Treated senna and MiraLAX Continue Senna qHs for now  Encourage oral hydration   A. Fib w/ PPM heart rate well controlled Continue amiodarone Continue Eliquis  Diastolic CHF compensated Continue home medication   No signs of fluid overload Follow up with PCP   Discharge Instructions  Discharge Instructions    Call MD for:  difficulty breathing, headache or visual disturbances    Complete by:  As directed    Call MD for:  extreme fatigue    Complete by:  As directed    Call MD for:  hives    Complete by:  As directed    Call MD for:  persistant dizziness or light-headedness    Complete by:  As directed    Call MD for:  persistant nausea and vomiting    Complete by:  As directed    Call MD for:  redness, tenderness, or signs of infection (pain, swelling, redness, odor or green/yellow discharge around incision site)    Complete by:  As directed    Call MD for:  severe uncontrolled pain    Complete by:  As directed    Call MD for:  temperature >100.4    Complete by:  As directed    Diet - low sodium heart healthy    Complete by:  As directed    Increase activity slowly    Complete by:  As directed      Allergies as of 11/13/2016      Reactions   Other Shortness Of Breath   Detergents, perfumes and other strong odor emitting compounds   Tramadol Shortness Of Breath, Nausea Only   Ibuprofen Other (See Comments)   Told not to take med      Medication List    STOP taking these medications   diphenhydrAMINE 25 MG tablet Commonly known as:  BENADRYL     TAKE  these medications   amiodarone 200 MG tablet Commonly known as:  PACERONE TAKE ONE TABLET BY MOUTH ONCE DAILY   denosumab 60 MG/ML Soln injection Commonly known as:  PROLIA Inject 60 mg into the skin every 6 (six) months. Administer in upper arm, thigh, or abdomen   ELIQUIS 2.5 MG Tabs tablet Generic drug:  apixaban TAKE ONE TABLET BY MOUTH TWICE DAILY   erythromycin ophthalmic ointment Place 1 application into both eyes daily.   Iron 325 (65 Fe) MG Tabs Take 1 tablet by mouth daily.   levothyroxine 75 MCG tablet Commonly known as:  SYNTHROID, LEVOTHROID TAKE ONE TABLET BY MOUTH ONCE DAILY   meclizine 12.5 MG  tablet Commonly known as:  ANTIVERT Take 1 tablet (12.5 mg total) by mouth 2 (two) times daily as needed for dizziness.   metolazone 2.5 MG tablet Commonly known as:  ZAROXOLYN Take 1 tablet (2.5 mg total) by mouth as needed (for weight 134 lb or greater).   nitroGLYCERIN 0.4 MG SL tablet Commonly known as:  NITROSTAT Place 1 tablet (0.4 mg total) under the tongue every 5 (five) minutes as needed. For chest pain   oxyCODONE-acetaminophen 5-325 MG tablet Commonly known as:  PERCOCET/ROXICET Take 1 tablet by mouth daily as needed for pain.   pantoprazole 40 MG tablet Commonly known as:  PROTONIX Take 40 mg by mouth daily.   potassium chloride SA 20 MEQ tablet Commonly known as:  K-DUR,KLOR-CON Take 2 tablets (40 mEq total) by mouth daily. Take an additional 39meq with Metolazone.   senna 8.6 MG Tabs tablet Commonly known as:  SENOKOT Take 2 tablets (17.2 mg total) by mouth at bedtime.   simvastatin 10 MG tablet Commonly known as:  ZOCOR TAKE ONE TABLET BY MOUTH ONCE DAILY AT BEDTIME   torsemide 20 MG tablet Commonly known as:  DEMADEX Take 2 tablets (40 mg total) by mouth daily.       Allergies  Allergen Reactions  . Other Shortness Of Breath    Detergents, perfumes and other strong odor emitting compounds  . Tramadol Shortness Of Breath and Nausea Only  . Ibuprofen Other (See Comments)    Told not to take med    Consultations:  None    Procedures/Studies: Ct Abdomen Pelvis Wo Contrast  Result Date: 11/11/2016 CLINICAL DATA:  Abdominal pain.  Constipation.  Nausea up. EXAM: CT ABDOMEN AND PELVIS WITHOUT CONTRAST TECHNIQUE: Multidetector CT imaging of the abdomen and pelvis was performed following the standard protocol without IV contrast. COMPARISON:  05/02/2016 FINDINGS: Lower chest: Chronic scarring at the lung bases. No pleural or pericardial fluid. Hepatobiliary: Normal without contrast.  Previous cholecystectomy. Pancreas: Normal Spleen: Normal  Adrenals/Urinary Tract: Adrenal glands are normal. Right kidney is normal except for 1 cm cyst at the lower pole. Left kidney contains multiple parapelvic cysts, the largest in the lower pole measuring 5 cm. Stomach/Bowel: Administered contrast has passed all the way through the small intestine and into the right colon, beginning to pass into the left colon. The patient does have a large amount of stool in the colon, but not extraordinarily so. No sign of true obstruction or focal lesion. Diverticulosis without evidence of diverticulitis. Vascular/Lymphatic: Aortic atherosclerosis. IVC is normal. No retroperitoneal mass or adenopathy. Reproductive: Negative Other: No ascites or free air. Musculoskeletal: Chronic curvature in degenerative change. IMPRESSION: No acute finding by CT. The patient does have a fairly large amount of stool in the colon, but not extraordinarily so. Administered contrast has passed well into the  colon. No bowel obstruction. No acute organ pathology. Electronically Signed   By: Nelson Chimes M.D.   On: 11/11/2016 21:59   Dg Chest 2 View  Result Date: 11/11/2016 CLINICAL DATA:  Chest pain and shortness of breath. EXAM: CHEST  2 VIEW COMPARISON:  Radiographs 11/02/2016 FINDINGS: Right-sided pacemaker with leads overlying the right atrium and ventricle. There is stable cardiomegaly. Atherosclerosis of the thoracic aorta. Decreased pulmonary edema with possible minimal residual. No focal airspace disease or pleural fluid. Again seen elevation of left hemidiaphragm. Air-filled colon under the right hemidiaphragm. Surgical anchor in the right shoulder. IMPRESSION: Decreased pulmonary edema with minimal residual. Stable cardiomegaly. Aortic atherosclerosis. Electronically Signed   By: Jeb Levering M.D.   On: 11/11/2016 21:06   Dg Chest 2 View  Result Date: 11/02/2016 CLINICAL DATA:  82 year old female with chest pain and nausea. EXAM: CHEST  2 VIEW COMPARISON:  Multiple chest  radiographs dating back to 03/31/2016 FINDINGS: Mild cardiomegaly with hilar prominence and perihilar haziness compatible with vascular congestion. No definite interstitial edema. Bibasilar, left greater right hazy airspace densities may represent atelectatic changes or developing infiltrate. There is no pleural effusion or pneumothorax. Right pectoral dual lead pacemaker device noted. There is focal area of slight kink in the pacemaker wires in the right intercostal space. The wires however remains intact. There is atherosclerotic calcification of the aortic arch. There is osteopenia with degenerative changes of the spine. No acute fracture. IMPRESSION: Cardiomegaly with vascular congestion. Bilateral lower lung field hazy densities may represent atelectasis versus infiltrate. Clinical correlation is recommended. Electronically Signed   By: Anner Crete M.D.   On: 11/02/2016 05:21   Dg Chest Portable 1 View  Result Date: 10/15/2016 CLINICAL DATA:  Chest pain. EXAM: PORTABLE CHEST 1 VIEW COMPARISON:  08/01/2016 FINDINGS: Stable appearance of the right dual chamber cardiac pacemaker. Increased interstitial densities in both lungs are suggestive for pulmonary edema. Heart size is within normal limits. Atherosclerotic calcifications at the aortic arch. No large pleural effusions. Negative for a pneumothorax. IMPRESSION: Evidence for interstitial pulmonary edema. Aortic atherosclerosis. Electronically Signed   By: Markus Daft M.D.   On: 10/15/2016 07:38      Discharge Exam: Vitals:   11/12/16 2101 11/13/16 0558  BP: (!) 96/56 (!) 103/59  Pulse: 70 71  Resp: 19 20  Temp: 98.2 F (36.8 C) 98.5 F (36.9 C)   Vitals:   11/12/16 2101 11/13/16 0051 11/13/16 0356 11/13/16 0558  BP: (!) 96/56   (!) 103/59  Pulse: 70   71  Resp: 19   20  Temp: 98.2 F (36.8 C)   98.5 F (36.9 C)  TempSrc:    Oral  SpO2: 98%   99%  Weight:  60.6 kg (133 lb 9.7 oz) 60.6 kg (133 lb 9.7 oz)   Height:         General: Pt is alert, awake, not in acute distress Cardiovascular: RRR, S1/S2 +, no rubs, no gallops Respiratory: CTA bilaterally, no wheezing, no rhonchi Abdominal: Soft, NT, ND, bowel sounds + Extremities: no edema, no cyanosis    The results of significant diagnostics from this hospitalization (including imaging, microbiology, ancillary and laboratory) are listed below for reference.     Microbiology: No results found for this or any previous visit (from the past 240 hour(s)).   Labs: BNP (last 3 results)  Recent Labs  08/02/16 0531 11/02/16 0439 11/11/16 1907  BNP 260.5* 292.6* 123456*   Basic Metabolic Panel:  Recent Labs Lab 11/11/16 1756  11/12/16 0429 11/13/16 0830  NA 136 137 142  K 2.8* 3.0* 4.6  CL 93* 97* 107  CO2 28 28 29   GLUCOSE 117* 98 104*  BUN 57* 56* 38*  CREATININE 3.11* 2.78* 2.22*  CALCIUM 10.0 9.2 9.5   Liver Function Tests:  Recent Labs Lab 11/11/16 1756  AST 27  ALT 21  ALKPHOS 70  BILITOT 0.9  PROT 7.9  ALBUMIN 3.8    Recent Labs Lab 11/11/16 1756  LIPASE 28   No results for input(s): AMMONIA in the last 168 hours. CBC:  Recent Labs Lab 11/11/16 1756 11/12/16 0429  WBC 6.4 5.0  HGB 15.0 13.7  HCT 44.4 41.0  MCV 91.9 93.2  PLT 141* 142*   Cardiac Enzymes: No results for input(s): CKTOTAL, CKMB, CKMBINDEX, TROPONINI in the last 168 hours. BNP: Invalid input(s): POCBNP CBG: No results for input(s): GLUCAP in the last 168 hours. D-Dimer No results for input(s): DDIMER in the last 72 hours. Hgb A1c No results for input(s): HGBA1C in the last 72 hours. Lipid Profile No results for input(s): CHOL, HDL, LDLCALC, TRIG, CHOLHDL, LDLDIRECT in the last 72 hours. Thyroid function studies No results for input(s): TSH, T4TOTAL, T3FREE, THYROIDAB in the last 72 hours.  Invalid input(s): FREET3 Anemia work up No results for input(s): VITAMINB12, FOLATE, FERRITIN, TIBC, IRON, RETICCTPCT in the last 72  hours. Urinalysis    Component Value Date/Time   COLORURINE STRAW (A) 11/11/2016 1911   APPEARANCEUR CLEAR 11/11/2016 1911   LABSPEC 1.005 11/11/2016 1911   PHURINE 5.0 11/11/2016 1911   GLUCOSEU NEGATIVE 11/11/2016 1911   GLUCOSEU NEGATIVE 12/19/2011 0956   HGBUR NEGATIVE 11/11/2016 1911   BILIRUBINUR NEGATIVE 11/11/2016 1911   BILIRUBINUR NEG 08/17/2016 1647   KETONESUR NEGATIVE 11/11/2016 1911   PROTEINUR NEGATIVE 11/11/2016 1911   UROBILINOGEN 1.0 08/17/2016 1647   UROBILINOGEN 0.2 09/10/2015 1150   NITRITE NEGATIVE 11/11/2016 1911   LEUKOCYTESUR SMALL (A) 11/11/2016 1911   Sepsis Labs Invalid input(s): PROCALCITONIN,  WBC,  LACTICIDVEN Microbiology No results found for this or any previous visit (from the past 240 hour(s)).   Time coordinating discharge: Over 30 minutes  SIGNED:  Chipper Oman, MD  [facility] 11/13/2016, 1:07 PM Pager   If 7PM-7AM, please contact night-coverage [email or web address] Password [email or web address]

## 2016-11-17 ENCOUNTER — Inpatient Hospital Stay (HOSPITAL_COMMUNITY)
Admission: EM | Admit: 2016-11-17 | Discharge: 2016-11-20 | DRG: 690 | Disposition: A | Discharge status: Home / Self Care | Payer: Medicare Other | Attending: Internal Medicine | Admitting: Internal Medicine

## 2016-11-17 ENCOUNTER — Encounter (HOSPITAL_COMMUNITY): Payer: Self-pay | Admitting: Emergency Medicine

## 2016-11-17 DIAGNOSIS — N3000 Acute cystitis without hematuria: Principal | ICD-10-CM | POA: Diagnosis present

## 2016-11-17 DIAGNOSIS — E039 Hypothyroidism, unspecified: Secondary | ICD-10-CM | POA: Diagnosis present

## 2016-11-17 DIAGNOSIS — E785 Hyperlipidemia, unspecified: Secondary | ICD-10-CM | POA: Diagnosis present

## 2016-11-17 DIAGNOSIS — Z801 Family history of malignant neoplasm of trachea, bronchus and lung: Secondary | ICD-10-CM

## 2016-11-17 DIAGNOSIS — R112 Nausea with vomiting, unspecified: Secondary | ICD-10-CM | POA: Diagnosis present

## 2016-11-17 DIAGNOSIS — Z950 Presence of cardiac pacemaker: Secondary | ICD-10-CM

## 2016-11-17 DIAGNOSIS — N184 Chronic kidney disease, stage 4 (severe): Secondary | ICD-10-CM | POA: Diagnosis present

## 2016-11-17 DIAGNOSIS — Z7901 Long term (current) use of anticoagulants: Secondary | ICD-10-CM

## 2016-11-17 DIAGNOSIS — R531 Weakness: Secondary | ICD-10-CM

## 2016-11-17 DIAGNOSIS — K529 Noninfective gastroenteritis and colitis, unspecified: Secondary | ICD-10-CM | POA: Diagnosis present

## 2016-11-17 DIAGNOSIS — I5042 Chronic combined systolic (congestive) and diastolic (congestive) heart failure: Secondary | ICD-10-CM | POA: Diagnosis present

## 2016-11-17 DIAGNOSIS — Z820 Family history of epilepsy and other diseases of the nervous system: Secondary | ICD-10-CM

## 2016-11-17 DIAGNOSIS — E860 Dehydration: Secondary | ICD-10-CM | POA: Diagnosis present

## 2016-11-17 DIAGNOSIS — Z85828 Personal history of other malignant neoplasm of skin: Secondary | ICD-10-CM

## 2016-11-17 DIAGNOSIS — K219 Gastro-esophageal reflux disease without esophagitis: Secondary | ICD-10-CM | POA: Diagnosis present

## 2016-11-17 DIAGNOSIS — D638 Anemia in other chronic diseases classified elsewhere: Secondary | ICD-10-CM | POA: Diagnosis present

## 2016-11-17 DIAGNOSIS — I4891 Unspecified atrial fibrillation: Secondary | ICD-10-CM | POA: Diagnosis present

## 2016-11-17 DIAGNOSIS — I10 Essential (primary) hypertension: Secondary | ICD-10-CM | POA: Diagnosis present

## 2016-11-17 DIAGNOSIS — D472 Monoclonal gammopathy: Secondary | ICD-10-CM | POA: Diagnosis present

## 2016-11-17 DIAGNOSIS — I480 Paroxysmal atrial fibrillation: Secondary | ICD-10-CM | POA: Diagnosis present

## 2016-11-17 DIAGNOSIS — I5032 Chronic diastolic (congestive) heart failure: Secondary | ICD-10-CM | POA: Diagnosis present

## 2016-11-17 DIAGNOSIS — Z8249 Family history of ischemic heart disease and other diseases of the circulatory system: Secondary | ICD-10-CM

## 2016-11-17 DIAGNOSIS — I130 Hypertensive heart and chronic kidney disease with heart failure and stage 1 through stage 4 chronic kidney disease, or unspecified chronic kidney disease: Secondary | ICD-10-CM | POA: Diagnosis present

## 2016-11-17 LAB — CBC WITH DIFFERENTIAL/PLATELET
BASOS ABS: 0 10*3/uL (ref 0.0–0.1)
BASOS PCT: 0 %
Eosinophils Absolute: 0 10*3/uL (ref 0.0–0.7)
Eosinophils Relative: 0 %
HEMATOCRIT: 42.4 % (ref 36.0–46.0)
HEMOGLOBIN: 13.9 g/dL (ref 12.0–15.0)
Lymphocytes Relative: 3 %
Lymphs Abs: 0.4 10*3/uL — ABNORMAL LOW (ref 0.7–4.0)
MCH: 31.2 pg (ref 26.0–34.0)
MCHC: 32.8 g/dL (ref 30.0–36.0)
MCV: 95.3 fL (ref 78.0–100.0)
Monocytes Absolute: 0.3 10*3/uL (ref 0.1–1.0)
Monocytes Relative: 3 %
NEUTROS ABS: 11.3 10*3/uL — AB (ref 1.7–7.7)
NEUTROS PCT: 94 %
Platelets: 145 10*3/uL — ABNORMAL LOW (ref 150–400)
RBC: 4.45 MIL/uL (ref 3.87–5.11)
RDW: 14 % (ref 11.5–15.5)
WBC: 12 10*3/uL — ABNORMAL HIGH (ref 4.0–10.5)

## 2016-11-17 NOTE — ED Triage Notes (Signed)
Pt BIB EMS from home c/o RLQ abdominal pain, onset approx 2hrs prior. Pt had N/V/D about an hour after initial onset of pain. Emesis x3 PTA of EMS. Given 4mg  zofran by EMS with decr'd nausea.

## 2016-11-18 ENCOUNTER — Emergency Department (HOSPITAL_COMMUNITY): Payer: Medicare Other

## 2016-11-18 DIAGNOSIS — E860 Dehydration: Secondary | ICD-10-CM

## 2016-11-18 DIAGNOSIS — R531 Weakness: Secondary | ICD-10-CM

## 2016-11-18 DIAGNOSIS — R112 Nausea with vomiting, unspecified: Secondary | ICD-10-CM | POA: Diagnosis present

## 2016-11-18 LAB — COMPREHENSIVE METABOLIC PANEL
ALK PHOS: 64 U/L (ref 38–126)
ALT: 20 U/L (ref 14–54)
ANION GAP: 9 (ref 5–15)
AST: 23 U/L (ref 15–41)
Albumin: 3.6 g/dL (ref 3.5–5.0)
BILIRUBIN TOTAL: 0.5 mg/dL (ref 0.3–1.2)
BUN: 32 mg/dL — ABNORMAL HIGH (ref 6–20)
CALCIUM: 9.4 mg/dL (ref 8.9–10.3)
CO2: 25 mmol/L (ref 22–32)
CREATININE: 2.04 mg/dL — AB (ref 0.44–1.00)
Chloride: 106 mmol/L (ref 101–111)
GFR, EST AFRICAN AMERICAN: 24 mL/min — AB (ref >60)
GFR, EST NON AFRICAN AMERICAN: 20 mL/min — AB (ref >60)
Glucose, Bld: 130 mg/dL — ABNORMAL HIGH (ref 65–99)
Potassium: 4.1 mmol/L (ref 3.5–5.1)
Sodium: 140 mmol/L (ref 135–145)
TOTAL PROTEIN: 6.7 g/dL (ref 6.5–8.1)

## 2016-11-18 LAB — URINALYSIS, ROUTINE W REFLEX MICROSCOPIC
BILIRUBIN URINE: NEGATIVE
Glucose, UA: NEGATIVE mg/dL
Hgb urine dipstick: NEGATIVE
KETONES UR: NEGATIVE mg/dL
Nitrite: NEGATIVE
PH: 5 (ref 5.0–8.0)
Protein, ur: NEGATIVE mg/dL
SPECIFIC GRAVITY, URINE: 1.011 (ref 1.005–1.030)

## 2016-11-18 LAB — I-STAT CG4 LACTIC ACID, ED: LACTIC ACID, VENOUS: 1.26 mmol/L (ref 0.5–1.9)

## 2016-11-18 LAB — TROPONIN I: TROPONIN I: 0.03 ng/mL — AB (ref <0.03)

## 2016-11-18 LAB — LIPASE, BLOOD: Lipase: 45 U/L (ref 11–51)

## 2016-11-18 MED ORDER — OXYCODONE-ACETAMINOPHEN 5-325 MG PO TABS
1.0000 | ORAL_TABLET | Freq: Three times a day (TID) | ORAL | Status: DC | PRN
  Administered 2016-11-18 – 2016-11-20 (×4): 1 via ORAL
  Filled 2016-11-18 (×4): qty 1

## 2016-11-18 MED ORDER — SODIUM CHLORIDE 0.9 % IV SOLN: INTRAVENOUS | Status: AC

## 2016-11-18 MED ORDER — ONDANSETRON HCL 4 MG/2ML IJ SOLN: 4.0000 mg | Freq: Four times a day (QID) | INTRAMUSCULAR | Status: DC | PRN

## 2016-11-18 MED ORDER — FERROUS SULFATE 325 (65 FE) MG PO TABS
325.0000 mg | ORAL_TABLET | Freq: Every day | ORAL | Status: DC
  Administered 2016-11-18 – 2016-11-20 (×3): 325 mg via ORAL
  Filled 2016-11-18 (×3): qty 1

## 2016-11-18 MED ORDER — SODIUM CHLORIDE 0.9 % IV SOLN: INTRAVENOUS | Status: DC

## 2016-11-18 MED ORDER — LEVOTHYROXINE SODIUM 75 MCG PO TABS
75.0000 ug | ORAL_TABLET | Freq: Every day | ORAL | Status: DC
  Administered 2016-11-18 – 2016-11-20 (×3): 75 ug via ORAL
  Filled 2016-11-18 (×3): qty 1

## 2016-11-18 MED ORDER — IRON 325 (65 FE) MG PO TABS: 1.0000 | ORAL_TABLET | Freq: Every day | ORAL | Status: DC

## 2016-11-18 MED ORDER — SODIUM CHLORIDE 0.9 % IV SOLN
Freq: Once | INTRAVENOUS | Status: AC
  Administered 2016-11-18: 06:00:00 via INTRAVENOUS

## 2016-11-18 MED ORDER — BISACODYL 5 MG PO TBEC: 5.0000 mg | DELAYED_RELEASE_TABLET | Freq: Every day | ORAL | Status: DC | PRN

## 2016-11-18 MED ORDER — PANTOPRAZOLE SODIUM 40 MG PO TBEC
40.0000 mg | DELAYED_RELEASE_TABLET | Freq: Every day | ORAL | Status: DC
  Administered 2016-11-18 – 2016-11-20 (×3): 40 mg via ORAL
  Filled 2016-11-18 (×3): qty 1

## 2016-11-18 MED ORDER — POTASSIUM CHLORIDE CRYS ER 20 MEQ PO TBCR
40.0000 meq | EXTENDED_RELEASE_TABLET | Freq: Every day | ORAL | Status: DC
  Administered 2016-11-18 – 2016-11-20 (×3): 40 meq via ORAL
  Filled 2016-11-18 (×3): qty 2

## 2016-11-18 MED ORDER — ACETAMINOPHEN 325 MG PO TABS: 650.0000 mg | ORAL_TABLET | Freq: Four times a day (QID) | ORAL | Status: DC | PRN

## 2016-11-18 MED ORDER — NITROGLYCERIN 0.4 MG SL SUBL: 0.4000 mg | SUBLINGUAL_TABLET | SUBLINGUAL | Status: DC | PRN

## 2016-11-18 MED ORDER — SODIUM CHLORIDE 0.9 % IV BOLUS (SEPSIS)
1000.0000 mL | Freq: Once | INTRAVENOUS | Status: AC
  Administered 2016-11-18: 1000 mL via INTRAVENOUS

## 2016-11-18 MED ORDER — ONDANSETRON HCL 4 MG/2ML IJ SOLN
4.0000 mg | Freq: Once | INTRAMUSCULAR | Status: AC
  Administered 2016-11-18: 4 mg via INTRAVENOUS
  Filled 2016-11-18: qty 2

## 2016-11-18 MED ORDER — MECLIZINE HCL 25 MG PO TABS: 12.5000 mg | ORAL_TABLET | Freq: Two times a day (BID) | ORAL | Status: DC | PRN

## 2016-11-18 MED ORDER — APIXABAN 2.5 MG PO TABS
2.5000 mg | ORAL_TABLET | Freq: Two times a day (BID) | ORAL | Status: DC
  Administered 2016-11-18 – 2016-11-20 (×5): 2.5 mg via ORAL
  Filled 2016-11-18 (×5): qty 1

## 2016-11-18 MED ORDER — DEXTROSE 5 % IV SOLN
1.0000 g | Freq: Once | INTRAVENOUS | Status: AC
  Administered 2016-11-18: 1 g via INTRAVENOUS
  Filled 2016-11-18: qty 10

## 2016-11-18 MED ORDER — CEFTRIAXONE SODIUM 1 G IJ SOLR
1.0000 g | Freq: Once | INTRAMUSCULAR | Status: DC
Start: 2016-11-19 — End: 2016-11-18

## 2016-11-18 MED ORDER — SIMVASTATIN 10 MG PO TABS
10.0000 mg | ORAL_TABLET | Freq: Every day | ORAL | Status: DC
  Administered 2016-11-18 – 2016-11-20 (×3): 10 mg via ORAL
  Filled 2016-11-18 (×3): qty 1

## 2016-11-18 MED ORDER — ACETAMINOPHEN 650 MG RE SUPP: 650.0000 mg | Freq: Four times a day (QID) | RECTAL | Status: DC | PRN

## 2016-11-18 MED ORDER — DEXTROSE 5 % IV SOLN
1.0000 g | INTRAVENOUS | Status: DC
  Administered 2016-11-19 – 2016-11-20 (×2): 1 g via INTRAVENOUS
  Filled 2016-11-18 (×2): qty 10

## 2016-11-18 MED ORDER — ONDANSETRON HCL 4 MG PO TABS
4.0000 mg | ORAL_TABLET | Freq: Four times a day (QID) | ORAL | Status: DC | PRN
  Administered 2016-11-19: 4 mg via ORAL
  Filled 2016-11-18: qty 1

## 2016-11-18 MED ORDER — SODIUM CHLORIDE 0.9% FLUSH
3.0000 mL | Freq: Two times a day (BID) | INTRAVENOUS | Status: DC
  Administered 2016-11-19 – 2016-11-20 (×2): 3 mL via INTRAVENOUS

## 2016-11-18 MED ORDER — ERYTHROMYCIN 5 MG/GM OP OINT
1.0000 "application " | TOPICAL_OINTMENT | Freq: Two times a day (BID) | OPHTHALMIC | Status: DC
  Administered 2016-11-18 – 2016-11-20 (×4): 1 via OPHTHALMIC
  Filled 2016-11-18: qty 3.5

## 2016-11-18 MED ORDER — METOCLOPRAMIDE HCL 5 MG/ML IJ SOLN
10.0000 mg | Freq: Once | INTRAMUSCULAR | Status: AC
  Administered 2016-11-18: 10 mg via INTRAVENOUS
  Filled 2016-11-18: qty 2

## 2016-11-18 MED ORDER — AMIODARONE HCL 200 MG PO TABS
200.0000 mg | ORAL_TABLET | Freq: Every day | ORAL | Status: DC
  Administered 2016-11-18 – 2016-11-20 (×3): 200 mg via ORAL
  Filled 2016-11-18 (×3): qty 1

## 2016-11-18 NOTE — Progress Notes (Signed)
[name] is a 83 year old female with pmh HTN, HLD, Gerd, s/p cholecystectomy, hypothyroidism; who presents with complaints of intractable N/V and abd pain. No chest pain Afebrile, EKG shows paced rhythm, respirations 27, blood pressure as low as 86/40. BPs remain soft despite 2 L of IV fluids. WBC 12 , CR 2.5 near baseline, troponin chronically elevated 0.03. Acute abdominal series negative.  Patient had recent CT scan of abd/pelvis that was neg 1 week ago. Admit for management of nausea vomiting symptoms.

## 2016-11-18 NOTE — ED Notes (Signed)
Dr. Wickline at bedside at this time.  

## 2016-11-18 NOTE — ED Provider Notes (Signed)
ED ECG REPORT   Date: 11/17/2016 2330  Rate: 72  Rhythm: ventricular paced rhythm  QRS Axis: left  Intervals: QT prolonged  ST/T Wave abnormalities: nonspecific ST changes  Conduction Disutrbances:none reviewed the EKG tracing and agree with the computerized printout as noted.    Ripley Fraise, MD 11/18/16 [phone]

## 2016-11-18 NOTE — Evaluation (Signed)
Physical Therapy Evaluation Patient Details Name: [name] MRN: [id number] DOB: 01/01/1925 Today's Date: 11/18/2016   History of Present Illness  83 y.o. female with medical history significant for HTN, HLD, paroxysmal atrial fibrillation s/p PPM , on a diastolic heart failure, hypothyroidism, chronic kidney disease admitted for nausea and vomiting.  Clinical Impression  Pt admitted with above com;lications. Pt currently with functional limitations due to the deficits listed below (see PT Problem List). Generalized weakness and deconditioned. Able to ambulate 75 feet without overt loss of balance. She will benefit from short term HHPT to further progress strength, balance, and endurance once she returns home. Pt will benefit from skilled PT to increase their independence and safety with mobility to allow discharge to the venue listed below.       Follow Up Recommendations Home health PT;Supervision - Intermittent    Equipment Recommendations  None recommended by PT    Recommendations for Other Services       Precautions / Restrictions Precautions Precautions: None Restrictions Weight Bearing Restrictions: No      Mobility  Bed Mobility Overal bed mobility: Modified Independent             General bed mobility comments: extra time, use of rails  Transfers Overall transfer level: Needs assistance Equipment used: None Transfers: Sit to/from Stand Sit to Stand: Supervision         General transfer comment: supervision for safety. Sway noted upon standing, improved with implementation of RW  for support. Performed from bed and chair.  Ambulation/Gait Ambulation/Gait assistance: Supervision Ambulation Distance (Feet): 75 Feet Assistive device: Rolling walker (2 wheeled);None Gait Pattern/deviations: Step-through pattern;Decreased stride length;Shuffle;Trunk flexed Gait velocity: slow Gait velocity interpretation: <1.8 ft/sec, indicative of risk for recurrent  falls General Gait Details: Moderately flexed trunk, gait without UE supoprt very guarded, slow, shuffled. Improves with RW for support, no buckling or overt loss of balance noted. Cues for walker placement for proximity. HR in 70s throughout. Denied dizziness.  Stairs            Wheelchair Mobility    Modified Rankin (Stroke Patients Only)       Balance Overall balance assessment: Needs assistance Sitting-balance support: No upper extremity supported;Feet supported Sitting balance-Leahy Scale: Normal     Standing balance support: No upper extremity supported Standing balance-Leahy Scale: Fair                               Pertinent Vitals/Pain Pain Assessment: No/denies pain    Home Living Family/patient expects to be discharged to:: Private residence Living Arrangements: Alone Available Help at Discharge: Family;Neighbor Type of Home: House Home Access: Stairs to enter Entrance Stairs-Rails: Chemical engineer of Steps: 3 Home Layout: One level Home Equipment: Environmental consultant - 2 wheels;Walker - 4 wheels;Cane - single point;Shower seat Additional Comments: Homes Away program 3x/week    Prior Function Level of Independence: Independent with assistive device(s)         Comments: Cane for mobility inside and in community when daughter takes her out     Hand Dominance   Dominant Hand: Right    Extremity/Trunk Assessment   Upper Extremity Assessment Upper Extremity Assessment: Defer to OT evaluation    Lower Extremity Assessment Lower Extremity Assessment: Generalized weakness       Communication   Communication: HOH  Cognition Arousal/Alertness: Awake/alert Behavior During Therapy: WFL for tasks assessed/performed Overall Cognitive Status: Within Functional Limits for tasks  assessed                      General Comments General comments (skin integrity, edema, etc.): HR in 70s during evaluation. No episodes of n/v or  dizziness    Exercises General Exercises - Lower Extremity Ankle Circles/Pumps: AROM;Both;15 reps;Seated Quad Sets: Strengthening;Both;10 reps;Seated   Assessment/Plan    PT Assessment Patient needs continued PT services  PT Problem List Decreased strength;Decreased activity tolerance;Decreased balance;Decreased mobility          PT Treatment Interventions DME instruction;Gait training;Stair training;Functional mobility training;Therapeutic activities;Therapeutic exercise;Balance training;Neuromuscular re-education    PT Goals (Current goals can be found in the Care Plan section)  Acute Rehab PT Goals Patient Stated Goal: Get my strength back PT Goal Formulation: With patient Time For Goal Achievement: 12/02/16 Potential to Achieve Goals: Good    Frequency Min 5X/week   Barriers to discharge Decreased caregiver support lives alone, has family and friends to visit intermittently    Co-evaluation               End of Session Equipment Utilized During Treatment: Gait belt Activity Tolerance: Patient tolerated treatment well Patient left: in chair;with call bell/phone within reach;with chair alarm set Nurse Communication: Mobility status    Functional Assessment Tool Used: Clinical observation Functional Limitation: Mobility: Walking and moving around Mobility: Walking and Moving Around Current Status [phone]): At least 1 percent but less than 20 percent impaired, limited or restricted Mobility: Walking and Moving Around Goal Status [phone]): At least 1 percent but less than 20 percent impaired, limited or restricted    Time: 1428-1450 PT Time Calculation (min) (ACUTE ONLY): 22 min   Charges:   PT Evaluation $PT Eval Low Complexity: 1 Procedure     PT G Codes:   PT G-Codes **NOT FOR INPATIENT CLASS** Functional Assessment Tool Used: Clinical observation Functional Limitation: Mobility: Walking and moving around Mobility: Walking and Moving Around Current Status  [phone]): At least 1 percent but less than 20 percent impaired, limited or restricted Mobility: Walking and Moving Around Goal Status [phone]): At least 1 percent but less than 20 percent impaired, limited or restricted    Ellouise Newer 11/18/2016, 3:12 PM  Elayne Snare, PT

## 2016-11-18 NOTE — ED Notes (Signed)
Patient transported to X-ray 

## 2016-11-18 NOTE — ED Provider Notes (Signed)
[facility] DEPT Provider Note   CSN: [id number] Arrival date & time: 11/17/16  2313   By signing my name below, I, Delton Prairie, attest that this documentation has been prepared under the direction and in the presence of Ripley Fraise, MD  Electronically Signed: Delton Prairie, ED Scribe. 11/18/16. 12:16 AM.   History   Chief Complaint Chief Complaint  Patient presents with  . Abdominal Pain  . Nausea  . Emesis   The history is provided by the patient. No language interpreter was used.  Abdominal Pain   This is a new problem. The current episode started 3 to 5 hours ago. The problem has not changed since onset.The pain is located in the generalized abdominal region. Associated symptoms include nausea and vomiting. Pertinent negatives include fever, diarrhea and dysuria. The symptoms are aggravated by palpation. Nothing relieves the symptoms.  Emesis   This is a new problem. The current episode started 1 to 2 hours ago. The problem has been gradually improving. There has been no fever. Associated symptoms include abdominal pain. Pertinent negatives include no cough, no diarrhea and no fever.   HPI Comments:  [name] is a 83 y.o. female, with a PMHx of GERD, HTN and PSHx of cholecystectomy, who presents to the Emergency Department complaining of sudden onset, moderate, abdominal pain which began 3 hours prior to arrival. Her pain worsened after applying a heating pad and is exacerbated upon palpation. Pt also reports right sided back pain, chest discomfort, lightheadedness, nausea and 3 episodes of vomiting 1 hour prior to arrival. She has had Zofran in the EMS truck with relief. Pt denies diarrhea, hematemesis, cough, SOB, fevers, dysuria, syncope, any other associated symptoms and any other modifying factors at this time.    Past Medical History:  Diagnosis Date  . Anemia   . Atrial fibrillation ([place])    a. Dx 01/2014->Eliquis started.  . Cancer ([place])   . Chest pain      a. 2011 Neg MV;  b. 01/2014 Cath: LM 20-30, LAD nl, D1 nl, LCX nl, OM1 nl, RCA dom 30-18m, PD/PL nl, EF 65%->Med Rx; c. 03/2016 MV: no ischemia/infarct. EF 63%.  . Degenerative joint disease   . Diastolic CHF, acute on chronic ([place]) 01/31/2012  . Dyslipidemia   . Dysrhythmia   . Family history of adverse reaction to anesthesia    " multiple family members have difficulty waking "  . GERD (gastroesophageal reflux disease)   . History of skin cancer    tafeen/ non melanoma.   Marland Kitchen Hx of varicella   . Hypertension   . Hypothyroidism   . Monoclonal gammopathies   . Pacemaker   . Palpitations    PVCs/bigeminy on event in May 2009 revealing this was relatively asymptomatic  . Right lower quadrant abdominal pain 07/19/2012   Recurrent  With nausea    This is new since she had ct for llq pain in [facility]    R/o appendiceal problem  Hernia  Get ct scan  And plan  Fu     . Thrombocytopenia [facility])     Patient Active Problem List   Diagnosis Date Noted  . AKI (acute kidney injury) ([place]) 11/11/2016  . Hypokalemia 11/11/2016  . Hypertensive heart disease 04/01/2016  . PAF (paroxysmal atrial fibrillation) ([place]) 03/31/2016  . Chest pain with moderate risk for cardiac etiology, negative MI and Nuc study, + muscular skeletal   . Atypical chest pain 01/21/2016  . CKD (chronic kidney disease) stage 4, GFR  15-29 ml/min ([place]) 05/09/2015  . Anemia of chronic disease 05/09/2015  . Dyslipidemia 05/09/2015  . Iron deficiency anemia 11/21/2014  . Pacemaker 04/27/2014  . Chronic anticoagulation 02/25/2014  . Chronic combined systolic and diastolic CHF (congestive heart failure) ([place]) 02/24/2014  . Atrial fibrillation ([place]) 01/29/2014  . MGUS (monoclonal gammopathy of unknown significance) 08/01/2013  . Hypothyroidism 02/19/2012  . Hyperlipidemia 12/15/2008  . HYPERTENSION, BENIGN 12/15/2008    Past Surgical History:  Procedure Laterality Date  . CARDIOVERSION N/A 02/26/2014   Procedure: CARDIOVERSION AT  BEDSIDE;  Surgeon: Pixie Casino, MD;  Location: [facility];  Service: Cardiovascular;  Laterality: N/A;  . CARDIOVERSION N/A 04/22/2014   Procedure: CARDIOVERSION (BEDSIDE);  Surgeon: [name], MD;  Location: [facility];  Service: Cardiovascular;  Laterality: N/A;  . CATARACT EXTRACTION     Bilateral  implantt  . CESAREAN SECTION     times 2  . CHOLECYSTECTOMY  1999  . ESOPHAGOGASTRODUODENOSCOPY N/A 07/06/2014   Procedure: ESOPHAGOGASTRODUODENOSCOPY (EGD);  Surgeon: Ladene Artist, MD;  Location[facility] ENDOSCOPY;  Service: Endoscopy;  Laterality: N/A;  . INSERT / REPLACE / REMOVE PACEMAKER    . LEFT HEART CATHETERIZATION WITH CORONARY ANGIOGRAM N/A 01/29/2014   Procedure: LEFT HEART CATHETERIZATION WITH CORONARY ANGIOGRAM;  Surgeon: Blane Ohara, MD;  Location: [facility] CATH LAB;  Service: Cardiovascular;  Laterality: N/A;  . PACEMAKER INSERTION  04/23/2014   STJ Assurity dual chamber pacemaker implanted by Dr Rayann Heman  . PERMANENT PACEMAKER INSERTION N/A 04/23/2014   Procedure: PERMANENT PACEMAKER INSERTION;  Surgeon: Evans Lance, MD;  Location: [facility] CATH LAB;  Service: Cardiovascular;  Laterality: N/A;  . RIGHT HEART CATHETERIZATION N/A 09/22/2014   Procedure: RIGHT HEART CATH;  Surgeon: Jolaine Artist, MD;  Location: [facility] C[facility] LAB;  Service: Cardiovascular;  Laterality: N/A;  . SHOULDER SURGERY  1996   Right     OB History    No data available       Home Medications    Prior to Admission medications   Medication Sig Start Date End Date Taking? Authorizing Provider  amiodarone (PACERONE) 200 MG tablet TAKE ONE TABLET BY MOUTH ONCE DAILY 11/10/16   Evans Lance, MD  denosumab (PROLIA) 60 MG/ML SOLN injection Inject 60 mg into the skin every 6 (six) months. Administer in upper arm, thigh, or abdomen    Historical Provider, MD  ELIQUIS 2.5 MG TABS tablet TAKE ONE TABLET BY MOUTH TWICE DAILY 10/18/16   Jolaine Artist, MD  erythromycin ophthalmic ointment Place 1 application into both  eyes daily.    Historical Provider, MD  Ferrous Sulfate (IRON) 325 (65 Fe) MG TABS Take 1 tablet by mouth daily. 07/01/16   Clanford Marisa Hua, MD  levothyroxine (SYNTHROID, LEVOTHROID) 75 MCG tablet TAKE ONE TABLET BY MOUTH ONCE DAILY 02/29/16   Burnis Medin, MD  meclizine (ANTIVERT) 12.5 MG tablet Take 1 tablet (12.5 mg total) by mouth 2 (two) times daily as needed for dizziness. 09/14/16   Jolaine Artist, MD  meclizine (ANTIVERT) 12.5 MG tablet TAKE ONE TABLET BY MOUTH TWICE DAILY AS NEEDED FOR DIZZINESS 11/15/16   Jolaine Artist, MD  metolazone (ZAROXOLYN) 2.5 MG tablet Take 1 tablet (2.5 mg total) by mouth as needed (for weight 134 lb or greater). 03/09/16   Jolaine Artist, MD  nitroGLYCERIN (NITROSTAT) 0.4 MG SL tablet Place 1 tablet (0.4 mg total) under the tongue every 5 (five) minutes as needed. For chest pain 05/06/16   Shaune Pascal  Bensimhon, MD  oxyCODONE-acetaminophen (PERCOCET/ROXICET) 5-325 MG tablet Take 1 tablet by mouth daily as needed for pain. 06/09/16   Historical Provider, MD  pantoprazole (PROTONIX) 40 MG tablet Take 40 mg by mouth daily.    Historical Provider, MD  potassium chloride SA (K-DUR,KLOR-CON) 20 MEQ tablet Take 2 tablets (40 mEq total) by mouth daily. Take an additional 64meq with Metolazone. 10/04/16   Jolaine Artist, MD  senna (SENOKOT) 8.6 MG TABS tablet Take 2 tablets (17.2 mg total) by mouth at bedtime. 11/13/16   Doreatha Lew, MD  simvastatin (ZOCOR) 10 MG tablet TAKE ONE TABLET BY MOUTH ONCE DAILY AT BEDTIME 09/30/16   Jolaine Artist, MD  torsemide (DEMADEX) 20 MG tablet Take 2 tablets (40 mg total) by mouth daily. 04/03/16   Isaiah Serge, NP    Family History Family History  Problem Relation Age of Onset  . Coronary artery disease Father   . Heart disease Father   . Heart attack Father   . Alzheimer's disease Mother   . Lung cancer Brother 33  . Lung cancer      Social History Social History  Substance Use Topics  . Smoking  status: Never Smoker  . Smokeless tobacco: Never Used  . Alcohol use No     Allergies   Other; Tramadol; and Ibuprofen   Review of Systems Review of Systems  Constitutional: Negative for fever.  Respiratory: Negative for cough and shortness of breath.   Cardiovascular: Positive for chest pain.  Gastrointestinal: Positive for abdominal pain, nausea and vomiting. Negative for diarrhea.  Genitourinary: Negative for dysuria.  Neurological: Positive for light-headedness. Negative for syncope.  All other systems reviewed and are negative.  Physical Exam Updated Vital Signs BP (!) 94/48   Pulse 72   Temp 97.7 F (36.5 C) (Oral)   Resp 25   SpO2 93%   Physical Exam CONSTITUTIONAL: Elderly and frail  HEAD: Normocephalic/atraumatic EYES: EOMI/PERRL ENMT: Mucous membranes dry NECK: supple no meningeal signs SPINE/BACK:entire spine nontender CV: S1/S2 noted, no murmurs/rubs/gallops noted LUNGS: Lungs are clear to auscultation bilaterally, no apparent distress ABDOMEN: soft, mild diffuse tenderness, no rebound or guarding, bowel sounds noted throughout abdomen GU:no cva tenderness NEURO: Pt is awake/alert/appropriate, moves all extremitiesx4.  No facial droop.   EXTREMITIES: pulses normal/equal, full ROM SKIN: warm, color normal PSYCH: no abnormalities of mood noted, alert and oriented to situation   ED Treatments / Results  DIAGNOSTIC STUDIES:  Oxygen Saturation is 93% on RA, adequate by my interpretation.    COORDINATION OF CARE:  12:11 AM Discussed treatment plan with pt at bedside and pt agreed to plan.  Labs (all labs ordered are listed, but only abnormal results are displayed) Labs Reviewed  COMPREHENSIVE METABOLIC PANEL - Abnormal; Notable for the following:       Result Value   Glucose, Bld 130 (*)    BUN 32 (*)    Creatinine, Ser 2.04 (*)    GFR calc non Af Amer 20 (*)    GFR calc Af Amer 24 (*)    All other components within normal limits  CBC WITH  DIFFERENTIAL/PLATELET - Abnormal; Notable for the following:    WBC 12.0 (*)    Platelets 145 (*)    Neutro Abs 11.3 (*)    Lymphs Abs 0.4 (*)    All other components within normal limits  TROPONIN I - Abnormal; Notable for the following:    Troponin I 0.03 (*)    All other  components within normal limits  URINALYSIS, ROUTINE W REFLEX MICROSCOPIC - Abnormal; Notable for the following:    Leukocytes, UA MODERATE (*)    Bacteria, UA RARE (*)    Squamous Epithelial / LPF 0-5 (*)    All other components within normal limits  URINE CULTURE  LIPASE, BLOOD  I-STAT CG4 LACTIC ACID, ED    EKG  EKG Interpretation None       Radiology Dg Abd Acute W/chest  Result Date: 11/18/2016 CLINICAL DATA:  Right lower quadrant abdominal pain, onset 2 hours prior to arrival. Nausea vomiting and diarrhea. EXAM: DG ABDOMEN ACUTE W/ 1V CHEST COMPARISON:  11/11/2016 FINDINGS: The abdominal gas pattern is negative for obstruction or perforation. Stool and air is present throughout the colon. Right upper quadrant cholecystectomy clips noted. No biliary or urinary calculi are evident. The upright view of the chest is negative for acute cardiopulmonary abnormality, with stable mild cardiomegaly and unchanged left hemidiaphragm elevation. IMPRESSION: Negative abdominal radiographs.  No acute cardiopulmonary disease. Electronically Signed   By: Andreas Newport M.D.   On: 11/18/2016 00:50    Procedures Procedures (including critical care time)  Medications Ordered in ED Medications  cefTRIAXone (ROCEPHIN) 1 g in dextrose 5 % 50 mL IVPB (1 g Intravenous New Bag/Given 11/18/16 0601)  ondansetron (ZOFRAN) injection 4 mg (4 mg Intravenous Given 11/18/16 0020)  sodium chloride 0.9 % bolus 1,000 mL (0 mLs Intravenous Stopped 11/18/16 0300)  metoCLOPramide (REGLAN) injection 10 mg (10 mg Intravenous Given 11/18/16 0225)  sodium chloride 0.9 % bolus 1,000 mL (0 mLs Intravenous Canceled Entry 11/18/16 0605)     Initial  Impression / Assessment and Plan / ED Course  I have reviewed the triage vital signs and the nursing notes.  Pertinent labs & imaging results that were available during my care of the patient were reviewed by me and considered in my medical decision making (see chart for details).  Clinical Course     5:40 AM Pt monitored for several hrs in the ED Overall, labs reassuring with negative lactate ABD pain improved, no focal tenderness, she had recent CT scan at end of December that was negative and negative plain films tonight She reports abd pain is resolved No CP is reported She reports feeling continued generalized weakness/fatigue She felt lightheaded upon ambulation Her BP has been labile, with some readings in the 80s, now in the 90s She has moderate leukocyte in urine, urine culture added on and rocephin ordered Will admit for continued weakness  6:08 AM D/w dr Fuller Plan for admission Pt stable Taking PO fluids  Will slow down IV fluids to 18ml/hr  Final Clinical Impressions(s) / ED Diagnoses   Final diagnoses:  Dehydration  Intractable vomiting with nausea, unspecified vomiting type  Weakness  Acute cystitis without hematuria    New Prescriptions New Prescriptions   No medications on file  I personally performed the services described in this documentation, which was scribed in my presence. The recorded information has been reviewed and is accurate.        Ripley Fraise, MD 11/18/16 [phone]

## 2016-11-18 NOTE — Progress Notes (Signed)
[name] is a 83 y.o. female patient admitted from ED awake, alert - oriented  X 4 - no acute distress noted.  VSS - Blood pressure (!) 91/35, pulse 70, temperature 98 F (36.7 C), temperature source Oral, resp. rate 15, SpO2 100 %.    IV in place, occlusive dsg intact without redness.  Orientation to room, and floor completed with information packet given to patient/family.  Patient watched safety video.  Admission INP armband ID verified with patient/family, and in place.   SR up x 2, fall assessment complete, with patient and family able to verbalize understanding of risk associated with falls, and verbalized understanding to call nsg before up out of bed.  Call light within reach, patient able to voice, and demonstrate understanding.  Skin, clean-dry- intact without evidence of bruising, or skin tears.   No evidence of skin break down noted on exam.     Will cont to eval and treat per MD orders.  Celine Ahr, RN 11/18/2016 8:41 AM

## 2016-11-18 NOTE — Progress Notes (Signed)
Manual BP taken. 98/60. MD made aware. Will continue to monitor

## 2016-11-18 NOTE — ED Notes (Signed)
Ambulated patient in the hall way. Patient seemed to walked fine.... Patient did state that she felt a little weak and dizzy.Marland KitchenMarland Kitchen

## 2016-11-18 NOTE — H&P (Signed)
History and Physical    [name] [id number] DOB: [birth year] DOA: 11/17/2016  PCP: [name], MD Patient coming from: home  Chief Complaint: nausea and vomiting/abdominal pain/generalized weakness  HPI: [name] is a 83 y.o. female with medical history significant for HTN, HLD, paroxysmal atrial fibrillation s/p PPM , on a diastolic heart failure, hypothyroidism, chronic kidney disease baseline creatinine around 2.2 and MGUS presents to the emergency department with the chief complaint sudden onset abdominal pain nausea and vomiting. Initial evaluation reassuring for acute abdomen, reveals moderate leukocytes in urine, and generalized weakness. Nausea/ vomiting and abdominal pain resolved on admission.  Information is obtained from the patient. She reports recently being discharged from the hospital after a 3 day stay for dehydration related to diarrhea resulting from treatment for constipation. She was eating and drinking her normal amount when she developed sudden onset moderate abdominal pain just before presenting to the emergency department. She describes the pain as sharp constant located throughout her abdomen. She reports placing a heating pad on her lower abdomen which seemed to make the pain worse. Associated symptoms include lightheadedness nausea and 3 episodes of vomiting just prior to arrival. She denies any coffee ground emesis. She also reported right sided back pain lightheadedness. He denied headache fever chills cough chest pain palpitation shortness of breath. She denied dysuria hematuria frequency or urgency. She denies diarrhea constipation melena bright red blood per rectum. She denies lower extremity edema orthopnea.    ED Course: In the emergency department she is afebrile blood pressure somewhat labile on the soft side not hypoxic nontoxic appearing. She is provided with IV fluids Rocephin and Zofran. At the time of admission patient is  symptom-free requesting food but demonstrating some generalized weakness and dizziness upon ambulation  Review of Systems: As per HPI otherwise 10 point review of systems negative.   Ambulatory Status: Patient lives at home alone uses a cane for ambulation she has in-home health 3 days a week she denies any recent falls  Past Medical History:  Diagnosis Date  . Anemia   . Atrial fibrillation ([place])    a. Dx 01/2014->Eliquis started.  . Cancer ([place])   . Chest pain    a. 2011 Neg MV;  b. 01/2014 Cath: LM 20-30, LAD nl, D1 nl, LCX nl, OM1 nl, RCA dom 30-54m, PD/PL nl, EF 65%->Med Rx; c. 03/2016 MV: no ischemia/infarct. EF 63%.  . Degenerative joint disease   . Diastolic CHF, acute on chronic ([place]) 01/31/2012  . Dyslipidemia   . Dysrhythmia   . Family history of adverse reaction to anesthesia    " multiple family members have difficulty waking "  . GERD (gastroesophageal reflux disease)   . History of skin cancer    tafeen/ non melanoma.   Marland Kitchen Hx of varicella   . Hypertension   . Hypothyroidism   . Monoclonal gammopathies   . Pacemaker   . Palpitations    PVCs/bigeminy on event in May 2009 revealing this was relatively asymptomatic  . Right lower quadrant abdominal pain 07/19/2012   Recurrent  With nausea    This is new since she had ct for llq pain in [facility]    R/o appendiceal problem  Hernia  Get ct scan  And plan  Fu     . Thrombocytopenia ([place])     Past Surgical History:  Procedure Laterality Date  . CARDIOVERSION N/A 02/26/2014   Procedure: CARDIOVERSION AT BEDSIDE;  Surgeon: Pixie Casino, MD;  Location: [facility]  OR;  Service: Cardiovascular;  Laterality: N/A;  . CARDIOVERSION N/A 04/22/2014   Procedure: CARDIOVERSION (BEDSIDE);  Surgeon: [name], MD;  Location: [facility] OR;  Service: Cardiovascular;  Laterality: N/A;  . CATARACT EXTRACTION     Bilateral  implantt  . CESAREAN SECTION     times 2  . CHOLECYSTECTOMY  1999  . ESOPHAGOGASTRODUODENOSCOPY N/A 07/06/2014   Procedure:  ESOPHAGOGASTRODUODENOSCOPY (EGD);  Surgeon: Ladene Artist, MD;  Location: [facility] ENDOSCOPY;  Service: Endoscopy;  Laterality: N/A;  . INSERT / REPLACE / REMOVE PACEMAKER    . LEFT HEART CATHETERIZATION WITH CORONARY ANGIOGRAM N/A 01/29/2014   Procedure: LEFT HEART CATHETERIZATION WITH CORONARY ANGIOGRAM;  Surgeon: Blane Ohara, MD;  Location: [facility] CATH LAB;  Service: Cardiovascular;  Laterality: N/A;  . PACEMAKER INSERTION  04/23/2014   STJ Assurity dual chamber pacemaker implanted by Dr Rayann Heman  . PERMANENT PACEMAKER INSERTION N/A 04/23/2014   Procedure: PERMANENT PACEMAKER INSERTION;  Surgeon: Evans Lance, MD;  Location: [facility] CATH LAB;  Service: Cardiovascular;  Laterality: N/A;  . RIGHT HEART CATHETERIZATION N/A 09/22/2014   Procedure: RIGHT HEART CATH;  Surgeon: Jolaine Artist, MD;  Location: [facility] CATH LAB;  Service: Cardiovascular;  Laterality: N/A;  . SHOULDER SURGERY  1996   Right     Social History   Social History  . Marital status: Widowed    Spouse name: N/A  . Number of children: 3  . Years of education: N/A   Occupational History  .  Retired    [employer], book keeping   Social History Main Topics  . Smoking status: Never Smoker  . Smokeless tobacco: Never Used  . Alcohol use No  . Drug use: No  . Sexual activity: No   Other Topics Concern  . 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 of 1  Has 2 labs    Neg tad [name]    G3P3      Daughter and gets some of her food and cooks at her house . Eats with her.      Daughterrenee handles medications. Sees HH and PT once a week.   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 Other Shortness Of Breath    Detergents, perfumes and other strong odor emitting compounds  . Tramadol Shortness Of Breath and Nausea Only  . Ibuprofen Other (See Comments)    Told not to  take med    Family History  Problem Relation Age of Onset  . Coronary artery disease Father   . Heart disease Father   . Heart attack Father   . Alzheimer's disease Mother   . Lung cancer Brother 42  . Lung cancer      Prior to Admission medications   Medication Sig Start Date End Date Taking? Authorizing Provider  amiodarone (PACERONE) 200 MG tablet TAKE ONE TABLET BY MOUTH ONCE DAILY 11/10/16  Yes Evans Lance, MD  denosumab (PROLIA) 60 MG/ML SOLN injection Inject 60 mg into the skin every 6 (six) months. Administer in upper arm, thigh, or abdomen   Yes Historical Provider, MD  ELIQUIS 2.5 MG TABS tablet TAKE ONE TABLET BY MOUTH TWICE DAILY 10/18/16  Yes Jolaine Artist, MD  erythromycin ophthalmic ointment Place 1 application into both eyes 2 (two) times daily.  Yes Historical Provider, MD  Ferrous Sulfate (IRON) 325 (65 Fe) MG TABS Take 1 tablet by mouth daily. 07/01/16  Yes Clanford Marisa Hua, MD  levothyroxine (SYNTHROID, LEVOTHROID) 75 MCG tablet TAKE ONE TABLET BY MOUTH ONCE DAILY 02/29/16  Yes Burnis Medin, MD  meclizine (ANTIVERT) 12.5 MG tablet Take 1 tablet (12.5 mg total) by mouth 2 (two) times daily as needed for dizziness. 09/14/16  Yes Jolaine Artist, MD  metolazone (ZAROXOLYN) 2.5 MG tablet Take 1 tablet (2.5 mg total) by mouth as needed (for weight 134 lb or greater). 03/09/16  Yes Jolaine Artist, MD  nitroGLYCERIN (NITROSTAT) 0.4 MG SL tablet Place 1 tablet (0.4 mg total) under the tongue every 5 (five) minutes as needed. For chest pain 05/06/16  Yes Jolaine Artist, MD  oxyCODONE-acetaminophen (PERCOCET/ROXICET) 5-325 MG tablet Take 1 tablet by mouth daily as needed for pain. 06/09/16  Yes Historical Provider, MD  pantoprazole (PROTONIX) 40 MG tablet Take 40 mg by mouth daily.   Yes Historical Provider, MD  potassium chloride SA (K-DUR,KLOR-CON) 20 MEQ tablet Take 2 tablets (40 mEq total) by mouth daily. Take an additional 85meq with Metolazone. 10/04/16   Yes Jolaine Artist, MD  senna (SENOKOT) 8.6 MG TABS tablet Take 2 tablets (17.2 mg total) by mouth at bedtime. 11/13/16  Yes Doreatha Lew, MD  simvastatin (ZOCOR) 10 MG tablet TAKE ONE TABLET BY MOUTH ONCE DAILY AT BEDTIME 09/30/16  Yes Jolaine Artist, MD  torsemide (DEMADEX) 20 MG tablet Take 2 tablets (40 mg total) by mouth daily. 04/03/16  Yes Isaiah Serge, NP    Physical Exam: Vitals:   11/18/16 0545 11/18/16 0600 11/18/16 0654 11/18/16 0700  BP: 108/65 (!) 100/53 (!) 101/50 95/56  Pulse:  73 83 74  Resp: 16 21  20   Temp:      TempSrc:      SpO2:  97% 97% 100%     General:  Appears calm and comfortable, sleeping  Eyes:  PERRL, EOMI, normal lids, iris ENT:  grossly normal hearing, lips & tongue, mucous membranes of her mouth are pink slightly dry Neck:  no LAD, masses or thyromegaly Cardiovascular:  RRR, no m/r/g. No LE edema. Pulses present and palpable Respiratory:  CTA bilaterally, no w/r/r. Normal respiratory effort. Abdomen:  soft, ntnd, sluggish bowel sounds no guarding or rebounding Skin:  no rash or induration seen on limited exam Musculoskeletal:  grossly normal tone BUE/BLE, good ROM, no bony abnormality Psychiatric:  grossly normal mood and affect, speech fluent and appropriate, AOx3 Neurologic:  CN 2-12 grossly intact, moves all extremities in coordinated fashion, sensation intact  Labs on Admission: I have personally reviewed following labs and imaging studies  CBC:  Recent Labs Lab 11/11/16 1756 11/12/16 0429 11/17/16 2332  WBC 6.4 5.0 12.0*  NEUTROABS  --   --  11.3*  HGB 15.0 13.7 13.9  HCT 44.4 41.0 42.4  MCV 91.9 93.2 95.3  PLT 141* 142* [id number]*   Basic Metabolic Panel:  Recent Labs Lab 11/11/16 1756 11/12/16 0429 11/13/16 0830 11/17/16 2332  NA 136 137 142 140  K 2.8* 3.0* 4.6 4.1  CL 93* 97* 107 106  CO2 28 28 29 25   GLUCOSE 117* 98 104* 130*  BUN 57* 56* 38* 32*  CREATININE 3.11* 2.78* 2.22* 2.04*  CALCIUM 10.0 9.2 9.5  9.4   GFR: Estimated Creatinine Clearance: 14.4 mL/min (by C-G formula based on SCr of 2.04 mg/dL (H)). Liver Function Tests:  Recent  Labs Lab 11/11/16 1756 11/17/16 2332  AST 27 23  ALT 21 20  ALKPHOS 70 64  BILITOT 0.9 0.5  PROT 7.9 6.7  ALBUMIN 3.8 3.6    Recent Labs Lab 11/11/16 1756 11/17/16 2332  LIPASE 28 45   No results for input(s): AMMONIA in the last 168 hours. Coagulation Profile: No results for input(s): INR, PROTIME in the last 168 hours. Cardiac Enzymes:  Recent Labs Lab 11/17/16 2332  TROPONINI 0.03*   BNP (last 3 results) No results for input(s): PROBNP in the last 8760 hours. HbA1C: No results for input(s): HGBA1C in the last 72 hours. CBG: No results for input(s): GLUCAP in the last 168 hours. Lipid Profile: No results for input(s): CHOL, HDL, LDLCALC, TRIG, CHOLHDL, LDLDIRECT in the last 72 hours. Thyroid Function Tests: No results for input(s): TSH, T4TOTAL, FREET4, T3FREE, THYROIDAB in the last 72 hours. Anemia Panel: No results for input(s): VITAMINB12, FOLATE, FERRITIN, TIBC, IRON, RETICCTPCT in the last 72 hours. Urine analysis:    Component Value Date/Time   COLORURINE YELLOW 11/18/2016 0011   APPEARANCEUR CLEAR 11/18/2016 0011   LABSPEC 1.011 11/18/2016 0011   PHURINE 5.0 11/18/2016 0011   GLUCOSEU NEGATIVE 11/18/2016 0011   GLUCOSEU NEGATIVE 12/19/2011 0956   HGBUR NEGATIVE 11/18/2016 0011   BILIRUBINUR NEGATIVE 11/18/2016 0011   BILIRUBINUR NEG 08/17/2016 1647   KETONESUR NEGATIVE 11/18/2016 0011   PROTEINUR NEGATIVE 11/18/2016 0011   UROBILINOGEN 1.0 08/17/2016 1647   UROBILINOGEN 0.2 09/10/2015 1150   NITRITE NEGATIVE 11/18/2016 0011   LEUKOCYTESUR MODERATE (A) 11/18/2016 0011    Creatinine Clearance: Estimated Creatinine Clearance: 14.4 mL/min (by C-G formula based on SCr of 2.04 mg/dL (H)).  Sepsis Labs: @LABRCNTIP (procalcitonin:4,lacticidven:4) )No results found for this or any previous visit (from the past 240  hour(s)).   Radiological Exams on Admission: Dg Abd Acute W/chest  Result Date: 11/18/2016 CLINICAL DATA:  Right lower quadrant abdominal pain, onset 2 hours prior to arrival. Nausea vomiting and diarrhea. EXAM: DG ABDOMEN ACUTE W/ 1V CHEST COMPARISON:  11/11/2016 FINDINGS: The abdominal gas pattern is negative for obstruction or perforation. Stool and air is present throughout the colon. Right upper quadrant cholecystectomy clips noted. No biliary or urinary calculi are evident. The upright view of the chest is negative for acute cardiopulmonary abnormality, with stable mild cardiomegaly and unchanged left hemidiaphragm elevation. IMPRESSION: Negative abdominal radiographs.  No acute cardiopulmonary disease. Electronically Signed   By: Andreas Newport M.D.   On: 11/18/2016 00:50    EKG: Independently reviewed. Ventricular-paced complexes No further rhythm analysis attempted due to paced rhythm Probable LVH with secondary repol  Assessment/Plan Principal Problem:   Intractable nausea and vomiting Active Problems:   HYPERTENSION, BENIGN   MGUS (monoclonal gammopathy of unknown significance)   Atrial fibrillation (HCC)   Chronic combined systolic and diastolic CHF (congestive heart failure) (HCC)   Chronic anticoagulation   CKD (chronic kidney disease) stage 4, GFR 15-29 ml/min (HCC)   Anemia of chronic disease   Dehydration   Generalized weakness   #1. Intractable nausea and vomiting. Resolved at point of admission. Etiology unclear. Dominant x-ray negative. CT of the abdomen last week no acute finding that noted fair amount of stool. Lipase within the limits of normal urinalysis with moderate leukocytes. WBC 12. She is afebrile with a slightly soft blood pressure. Nontoxic appearing He was provided with one dose of Rocephin in the emergency department. Requesting food at time of admission -Admit for observation -Gentle IV fluids -Full liquid diet to  be advanced as tolerated -Zofran as  needed for nausea -Follow urine culture  #2. Hypertension. Blood pressure soft in the emergency department. She is provided with IV fluids. Home medications include ER and Zaroxolyn and torsemide. -We'll hold these for now -Gentle IV fluids -Daily weights -close  Monitoring -We'll resume home meds when indicated  #3. Generalized weakness. Likely related to mild dehydration in the setting of #1 and diuretics. Ambulated in hall in emergency department with one man assist complained of weakness and lightheadedness. Denied chest pain. Troponin 0.03 chart review indicates this is close to her baseline. Urinalysis with moderate leukocytes.  -We'll just had a vital signs -Gentle IV fluids -Physical therapy -Follow urine culture -See above therapies  #4. Chronic kidney disease stage III. Baseline creatinine 2.2. Creatinine on admission close to baseline. -Gentle IV fluids as noted above -Monitor urine output -Recheck in the morning  #5. Atrial fibrillation with PPM. Rate controlled. chadvasc score 5 -EKG as noted above -Home medications include amiodarone and eliquis -Continue home meds  #6. Chronic diastolic heart failure. Appears compensated on admission. Echo done March 2017 with and EF of [id number] grade 2 diastolic dysfunction. Home medications include amiodarone Demadex when necessary Zaroxolyn -Holding Demadex and Zaroxolyn for now -Very gentle IV fluids -Daily weights -Monitor intake and output -Resume Demadex as indicated -Continue amiodarone  #7. Anemia. Likely related to chronic disease. Hemoglobin 13. -Continue iron supplement  #8. Hypothyroidism -Continue home meds     DVT prophylaxis:  eliquis Code Status: full per patient Family Communication: none present  Disposition Plan: home hopefully in am  Consults called: none  Admission status: obs    Radene Gunning MD [facility]  If 7PM-7AM, please contact night-coverage [email or web address] Password  [facility]  11/18/2016, 7:44 AM

## 2016-11-18 NOTE — Progress Notes (Signed)
Pharmacy Antibiotic Note  [name] is a 83 y.o. female admitted on 11/17/2016 with abdominal pain, N/V. Pharmacy has now been consulted to start Rocephin for empiric UTI coverage   Plan: 1. Continue Rocephin 1g IV every 24 hours 2. Pharmacy will sign off as no further dose adjustments are expected.      Temp (24hrs), Avg:98 F (36.7 C), Min:97.7 F (36.5 C), Max:98.4 F (36.9 C)   Recent Labs Lab 11/11/16 1756 11/12/16 0429 11/13/16 0830 11/17/16 2332 11/18/16 0015  WBC 6.4 5.0  --  12.0*  --   CREATININE 3.11* 2.78* 2.22* 2.04*  --   LATICACIDVEN  --   --   --   --  1.26    Estimated Creatinine Clearance: 14.4 mL/min (by C-G formula based on SCr of 2.04 mg/dL (H)).    Allergies  Allergen Reactions  . Other Shortness Of Breath    Detergents, perfumes and other strong odor emitting compounds  . Tramadol Shortness Of Breath and Nausea Only  . Ibuprofen Other (See Comments)    Told not to take med    Thank you for allowing pharmacy to be a part of this patient's care.  Alycia Rossetti, PharmD, BCPS Clinical Pharmacist Pager: [phone] 11/18/2016 4:18 PM

## 2016-11-19 DIAGNOSIS — I480 Paroxysmal atrial fibrillation: Secondary | ICD-10-CM

## 2016-11-19 DIAGNOSIS — Z7901 Long term (current) use of anticoagulants: Secondary | ICD-10-CM | POA: Diagnosis not present

## 2016-11-19 DIAGNOSIS — Z820 Family history of epilepsy and other diseases of the nervous system: Secondary | ICD-10-CM | POA: Diagnosis not present

## 2016-11-19 DIAGNOSIS — N3000 Acute cystitis without hematuria: Secondary | ICD-10-CM | POA: Diagnosis present

## 2016-11-19 DIAGNOSIS — R112 Nausea with vomiting, unspecified: Secondary | ICD-10-CM | POA: Diagnosis not present

## 2016-11-19 DIAGNOSIS — I130 Hypertensive heart and chronic kidney disease with heart failure and stage 1 through stage 4 chronic kidney disease, or unspecified chronic kidney disease: Secondary | ICD-10-CM | POA: Diagnosis present

## 2016-11-19 DIAGNOSIS — E039 Hypothyroidism, unspecified: Secondary | ICD-10-CM | POA: Diagnosis present

## 2016-11-19 DIAGNOSIS — D472 Monoclonal gammopathy: Secondary | ICD-10-CM | POA: Diagnosis present

## 2016-11-19 DIAGNOSIS — E785 Hyperlipidemia, unspecified: Secondary | ICD-10-CM | POA: Diagnosis present

## 2016-11-19 DIAGNOSIS — K529 Noninfective gastroenteritis and colitis, unspecified: Secondary | ICD-10-CM | POA: Diagnosis present

## 2016-11-19 DIAGNOSIS — N184 Chronic kidney disease, stage 4 (severe): Secondary | ICD-10-CM | POA: Diagnosis present

## 2016-11-19 DIAGNOSIS — K219 Gastro-esophageal reflux disease without esophagitis: Secondary | ICD-10-CM | POA: Diagnosis present

## 2016-11-19 DIAGNOSIS — Z950 Presence of cardiac pacemaker: Secondary | ICD-10-CM | POA: Diagnosis not present

## 2016-11-19 DIAGNOSIS — Z801 Family history of malignant neoplasm of trachea, bronchus and lung: Secondary | ICD-10-CM | POA: Diagnosis not present

## 2016-11-19 DIAGNOSIS — I5042 Chronic combined systolic (congestive) and diastolic (congestive) heart failure: Secondary | ICD-10-CM | POA: Diagnosis present

## 2016-11-19 DIAGNOSIS — Z8249 Family history of ischemic heart disease and other diseases of the circulatory system: Secondary | ICD-10-CM | POA: Diagnosis not present

## 2016-11-19 DIAGNOSIS — D638 Anemia in other chronic diseases classified elsewhere: Secondary | ICD-10-CM | POA: Diagnosis present

## 2016-11-19 DIAGNOSIS — E860 Dehydration: Secondary | ICD-10-CM | POA: Diagnosis present

## 2016-11-19 DIAGNOSIS — Z85828 Personal history of other malignant neoplasm of skin: Secondary | ICD-10-CM | POA: Diagnosis not present

## 2016-11-19 LAB — BASIC METABOLIC PANEL
ANION GAP: 9 (ref 5–15)
BUN: 22 mg/dL — ABNORMAL HIGH (ref 6–20)
CALCIUM: 9 mg/dL (ref 8.9–10.3)
CHLORIDE: 104 mmol/L (ref 101–111)
CO2: 25 mmol/L (ref 22–32)
Creatinine, Ser: 1.61 mg/dL — ABNORMAL HIGH (ref 0.44–1.00)
GFR calc non Af Amer: 27 mL/min — ABNORMAL LOW (ref >60)
GFR, EST AFRICAN AMERICAN: 32 mL/min — AB (ref >60)
GLUCOSE: 89 mg/dL (ref 65–99)
POTASSIUM: 4.7 mmol/L (ref 3.5–5.1)
Sodium: 138 mmol/L (ref 135–145)

## 2016-11-19 LAB — URINE CULTURE: Culture: NO GROWTH

## 2016-11-19 LAB — CBC
HEMATOCRIT: 42 % (ref 36.0–46.0)
HEMOGLOBIN: 13.3 g/dL (ref 12.0–15.0)
MCH: 31.2 pg (ref 26.0–34.0)
MCHC: 31.7 g/dL (ref 30.0–36.0)
MCV: 98.6 fL (ref 78.0–100.0)
Platelets: 112 10*3/uL — ABNORMAL LOW (ref 150–400)
RBC: 4.26 MIL/uL (ref 3.87–5.11)
RDW: 15.1 % (ref 11.5–15.5)
WBC: 3.9 10*3/uL — ABNORMAL LOW (ref 4.0–10.5)

## 2016-11-19 LAB — TROPONIN I: Troponin I: 0.03 ng/mL (ref <0.03)

## 2016-11-19 MED ORDER — GI COCKTAIL ~~LOC~~
30.0000 mL | Freq: Once | ORAL | Status: AC
  Administered 2016-11-19: 30 mL via ORAL
  Filled 2016-11-19: qty 30

## 2016-11-19 MED ORDER — SODIUM CHLORIDE 0.9 % IV SOLN
INTRAVENOUS | Status: DC
  Administered 2016-11-19: 14:00:00 via INTRAVENOUS

## 2016-11-19 NOTE — Progress Notes (Signed)
Pt complains of burning across chest. VSS and no other symptoms. MD made aware. Waiting to hear back. Will continue to monitor.

## 2016-11-19 NOTE — Progress Notes (Signed)
PROGRESS NOTE                                                                                                                                                                                                             Patient Demographics:    [name], is a 83 y.o. female, DOB - 1925-06-04, [id number]  Admit date - 11/17/2016   Admitting Physician Norval Morton, MD  Outpatient Primary MD for the patient is [name], MD  LOS - 0   Chief Complaint  Patient presents with  . Abdominal Pain  . Nausea  . Emesis       Brief Narrative   [name] is a 83 y.o. female with a Past Medical History sig for HTN, afib, chf, hypothyroid, chronic CKD who presents with N/V. Dx of UTI.    Subjective:    [name] today has, No headache, No chest pain, No abdominal pain - Portion is feeling better, will advance to soft diet, unable to tolerate any oral intake giving severe nausea, reports epigastric pain this a.m. of .    Assessment  & Plan :    Principal Problem:   Intractable nausea and vomiting Active Problems:   HYPERTENSION, BENIGN   MGUS (monoclonal gammopathy of unknown significance)   Atrial fibrillation (HCC)   Chronic combined systolic and diastolic CHF (congestive heart failure) (HCC)   Chronic anticoagulation   CKD (chronic kidney disease) stage 4, GFR 15-29 ml/min (HCC)   Anemia of chronic disease   Dehydration   Generalized weakness   Intractable nausea and vomiting. -  Unclear etiology, CT of the abdomen last week no acute finding that noted fair amount of stool. Lipase within the limits of normal urinalysis with moderate leukocytes - Report improvement of her symptoms, but still unable to tolerate soft diet, given severe nausea, so will continue when necessary nausea medication, will start on IV fluids.  UTI - She reports polyuria, follow urine cultures, continue with  Rocephin  Hypertension -  Remains with soft blood pressure, continue to hold all meds, continue with gentle hydration   Generalized weakness.  - Likely related to mild dehydration in the setting of #1 and diuretics  Chronic kidney disease stage III. - Baseline creatinine 2.2. Creatinine on admission close to baseline. -Gentle IV fluids as noted above  Atrial  fibrillation with PPM.  - Rate controlled. chadvasc score 5 -EKG as noted above -Continue home medication amiodarone and eliquis - with an episode of chest pain, per further questioning appears to be epigastric, troponins negative, and EKG nonacute   Chronic diastolic heart failure.  -Appears compensated on admission. Echo done March 2017 with and EF of [id number] grade 2 diastolic dysfunction. -Holding Demadex and Zaroxolyn for now -Very gentle IV fluids  Hypothyroidism -Continue home meds    Code Status : Full  Family Communication  : none at bedside  Disposition Plan  : home when able to tolerate soft diet  Consults  :  None  Procedures  : None  DVT Prophylaxis  :  Eliquis  Lab Results  Component Value Date   PLT 112 (L) 11/19/2016   Antibiotics  :    Anti-infectives    Start     Dose/Rate Route Frequency Ordered Stop   11/19/16 1000  cefTRIAXone (ROCEPHIN) 1 g in dextrose 5 % 50 mL IVPB  Status:  Discontinued     1 g 100 mL/hr over 30 Minutes Intravenous  Once 11/18/16 1352 11/18/16 1621   11/19/16 0600  cefTRIAXone (ROCEPHIN) 1 g in dextrose 5 % 50 mL IVPB     1 g 100 mL/hr over 30 Minutes Intravenous Every 24 hours 11/18/16 1621     11/18/16 0545  cefTRIAXone (ROCEPHIN) 1 g in dextrose 5 % 50 mL IVPB     1 g 100 mL/hr over 30 Minutes Intravenous  Once 11/18/16 0538 11/18/16 0654        Objective:   Vitals:   11/18/16 2220 11/19/16 0500 11/19/16 0525 11/19/16 0709  BP: (!) 107/56  (!) 116/59 (!) 113/57  Pulse: 73  74 73  Resp: 16  18 17   Temp: 98.3 F (36.8 C)  98.4 F (36.9 C) 98.2 F (36.8  C)  TempSrc: Oral  Oral Oral  SpO2: 96%  93% 92%  Weight:  60.9 kg (134 lb 4.2 oz)      Wt Readings from Last 3 Encounters:  11/19/16 60.9 kg (134 lb 4.2 oz)  11/13/16 60.6 kg (133 lb 9.7 oz)  11/02/16 56.7 kg (125 lb)     Intake/Output Summary (Last 24 hours) at 11/19/16 1321 Last data filed at 11/18/16 1815  Gross per 24 hour  Intake           284.17 ml  Output                1 ml  Net           283.17 ml     Physical Exam  Awake Alert, Oriented X 3, No new F.N deficits, Normal affect [place].AT,PERRAL Supple Neck,No JVD, No cervical lymphadenopathy appriciated.  Symmetrical Chest wall movement, Good air movement bilaterally, CTAB RRR,No Gallops,Rubs or new Murmurs, No Parasternal Heave +ve B.Sounds, Abd Soft, No tenderness, No rebound - guarding or rigidity. No Cyanosis, Clubbing or edema, No new Rash or bruise     Data Review:    CBC  Recent Labs Lab 11/17/16 2332 11/19/16 0852  WBC 12.0* 3.9*  HGB 13.9 13.3  HCT 42.4 42.0  PLT 145* 112*  MCV 95.3 98.6  MCH 31.2 31.2  MCHC 32.8 31.7  RDW 14.0 15.1  LYMPHSABS 0.4*  --   MONOABS 0.3  --   EOSABS 0.0  --   BASOSABS 0.0  --     Chemistries   Recent Labs Lab 11/13/16 0830 11/17/16 2332  11/19/16 0852  NA 142 140 138  K 4.6 4.1 4.7  CL 107 106 104  CO2 29 25 25   GLUCOSE 104* 130* 89  BUN 38* 32* 22*  CREATININE 2.22* 2.04* 1.61*  CALCIUM 9.5 9.4 9.0  AST  --  23  --   ALT  --  20  --   ALKPHOS  --  64  --   BILITOT  --  0.5  --    ------------------------------------------------------------------------------------------------------------------ No results for input(s): CHOL, HDL, LDLCALC, TRIG, CHOLHDL, LDLDIRECT in the last 72 hours.  No results found for: HGBA1C ------------------------------------------------------------------------------------------------------------------ No results for input(s): TSH, T4TOTAL, T3FREE, THYROIDAB in the last 72 hours.  Invalid input(s):  FREET3 ------------------------------------------------------------------------------------------------------------------ No results for input(s): VITAMINB12, FOLATE, FERRITIN, TIBC, IRON, RETICCTPCT in the last 72 hours.  Coagulation profile No results for input(s): INR, PROTIME in the last 168 hours.  No results for input(s): DDIMER in the last 72 hours.  Cardiac Enzymes  Recent Labs Lab 11/17/16 2332 11/19/16 0852  TROPONINI 0.03* 0.03*   ------------------------------------------------------------------------------------------------------------------    Component Value Date/Time   BNP 264.3 (H) 11/11/2016 1907    Inpatient Medications  Scheduled Meds: . amiodarone  200 mg Oral Daily  . apixaban  2.5 mg Oral BID  . cefTRIAXone (ROCEPHIN) IVPB 1 gram/50 mL D5W  1 g Intravenous Q24H  . erythromycin  1 application Both Eyes BID  . ferrous sulfate  325 mg Oral Q breakfast  . levothyroxine  75 mcg Oral QAC breakfast  . pantoprazole  40 mg Oral Daily  . potassium chloride SA  40 mEq Oral Daily  . simvastatin  10 mg Oral Q breakfast  . sodium chloride flush  3 mL Intravenous Q12H   Continuous Infusions: . sodium chloride     PRN Meds:.acetaminophen **OR** acetaminophen, bisacodyl, meclizine, nitroGLYCERIN, ondansetron **OR** ondansetron (ZOFRAN) IV, oxyCODONE-acetaminophen  Micro Results Recent Results (from the past 240 hour(s))  Urine culture     Status: None   Collection Time: 11/18/16  5:39 AM  Result Value Ref Range Status   Specimen Description URINE, RANDOM  Final   Special Requests NONE  Final   Culture NO GROWTH  Final   Report Status 11/19/2016 FINAL  Final    Radiology Reports Ct Abdomen Pelvis Wo Contrast  Result Date: 11/11/2016 CLINICAL DATA:  Abdominal pain.  Constipation.  Nausea up. EXAM: CT ABDOMEN AND PELVIS WITHOUT CONTRAST TECHNIQUE: Multidetector CT imaging of the abdomen and pelvis was performed following the standard protocol without IV  contrast. COMPARISON:  05/02/2016 FINDINGS: Lower chest: Chronic scarring at the lung bases. No pleural or pericardial fluid. Hepatobiliary: Normal without contrast.  Previous cholecystectomy. Pancreas: Normal Spleen: Normal Adrenals/Urinary Tract: Adrenal glands are normal. Right kidney is normal except for 1 cm cyst at the lower pole. Left kidney contains multiple parapelvic cysts, the largest in the lower pole measuring 5 cm. Stomach/Bowel: Administered contrast has passed all the way through the small intestine and into the right colon, beginning to pass into the left colon. The patient does have a large amount of stool in the colon, but not extraordinarily so. No sign of true obstruction or focal lesion. Diverticulosis without evidence of diverticulitis. Vascular/Lymphatic: Aortic atherosclerosis. IVC is normal. No retroperitoneal mass or adenopathy. Reproductive: Negative Other: No ascites or free air. Musculoskeletal: Chronic curvature in degenerative change. IMPRESSION: No acute finding by CT. The patient does have a fairly large amount of stool in the colon, but not extraordinarily so. Administered contrast has passed well  into the colon. No bowel obstruction. No acute organ pathology. Electronically Signed   By: Nelson Chimes M.D.   On: 11/11/2016 21:59   Dg Chest 2 View  Result Date: 11/11/2016 CLINICAL DATA:  Chest pain and shortness of breath. EXAM: CHEST  2 VIEW COMPARISON:  Radiographs 11/02/2016 FINDINGS: Right-sided pacemaker with leads overlying the right atrium and ventricle. There is stable cardiomegaly. Atherosclerosis of the thoracic aorta. Decreased pulmonary edema with possible minimal residual. No focal airspace disease or pleural fluid. Again seen elevation of left hemidiaphragm. Air-filled colon under the right hemidiaphragm. Surgical anchor in the right shoulder. IMPRESSION: Decreased pulmonary edema with minimal residual. Stable cardiomegaly. Aortic atherosclerosis. Electronically  Signed   By: Jeb Levering M.D.   On: 11/11/2016 21:06   Dg Chest 2 View  Result Date: 11/02/2016 CLINICAL DATA:  83 year old female with chest pain and nausea. EXAM: CHEST  2 VIEW COMPARISON:  Multiple chest radiographs dating back to 03/31/2016 FINDINGS: Mild cardiomegaly with hilar prominence and perihilar haziness compatible with vascular congestion. No definite interstitial edema. Bibasilar, left greater right hazy airspace densities may represent atelectatic changes or developing infiltrate. There is no pleural effusion or pneumothorax. Right pectoral dual lead pacemaker device noted. There is focal area of slight kink in the pacemaker wires in the right intercostal space. The wires however remains intact. There is atherosclerotic calcification of the aortic arch. There is osteopenia with degenerative changes of the spine. No acute fracture. IMPRESSION: Cardiomegaly with vascular congestion. Bilateral lower lung field hazy densities may represent atelectasis versus infiltrate. Clinical correlation is recommended. Electronically Signed   By: Anner Crete M.D.   On: 11/02/2016 05:21   Dg Abd Acute W/chest  Result Date: 11/18/2016 CLINICAL DATA:  Right lower quadrant abdominal pain, onset 2 hours prior to arrival. Nausea vomiting and diarrhea. EXAM: DG ABDOMEN ACUTE W/ 1V CHEST COMPARISON:  11/11/2016 FINDINGS: The abdominal gas pattern is negative for obstruction or perforation. Stool and air is present throughout the colon. Right upper quadrant cholecystectomy clips noted. No biliary or urinary calculi are evident. The upright view of the chest is negative for acute cardiopulmonary abnormality, with stable mild cardiomegaly and unchanged left hemidiaphragm elevation. IMPRESSION: Negative abdominal radiographs.  No acute cardiopulmonary disease. Electronically Signed   By: Andreas Newport M.D.   On: 11/18/2016 00:50     Anan Dapolito M.D on 11/19/2016 at 1:21 PM  Between 7am to 7pm -  Pager - [phone]  After 7pm go to [email or web address] - password [email or web address]  [facility] -  Office  [phone]

## 2016-11-19 NOTE — Progress Notes (Signed)
MD gave verbal order to give a GI cocktail. Will administer and continue to monitor.

## 2016-11-20 DIAGNOSIS — I5042 Chronic combined systolic (congestive) and diastolic (congestive) heart failure: Secondary | ICD-10-CM

## 2016-11-20 MED ORDER — TORSEMIDE 20 MG PO TABS: 40.0000 mg | ORAL_TABLET | Freq: Every day | ORAL | 6 refills | Status: DC

## 2016-11-20 NOTE — Progress Notes (Signed)
[name] to be D/C'd Home per MD order.  Discussed with the patient and all questions fully answered.  VSS, Skin clean, dry and intact without evidence of skin break down, no evidence of skin tears noted. IV catheter discontinued intact. Site without signs and symptoms of complications. Dressing and pressure applied.  An After Visit Summary was printed and given to the patient. Patient received prescription.  D/c education completed with patient/family including follow up instructions, medication list, d/c activities limitations if indicated, with other d/c instructions as indicated by MD - patient able to verbalize understanding, all questions fully answered.   Patient instructed to return to ED, call 911, or call MD for any changes in condition.   Patient escorted via [place], and D/C home via private auto. CCMD notified   [name] 11/20/2016 11:30 AM

## 2016-11-20 NOTE — Discharge Summary (Signed)
[name], is a 82 y.o. female  DOB May 28, 1925  MRN [id number].  Admission date:  11/17/2016  Admitting Physician  Norval Morton, MD  Discharge Date:  11/20/2016   Primary MD  [name], MD  Recommendations for primary care physician for things to follow:  - Please check CBC, BMP during next visit   Admission Diagnosis  Dehydration [E86.0] Weakness [R53.1] Acute cystitis without hematuria [N30.00] Intractable vomiting with nausea, unspecified vomiting type [R11.2]   Discharge Diagnosis  Dehydration [E86.0] Weakness [R53.1] Acute cystitis without hematuria [N30.00] Intractable vomiting with nausea, unspecified vomiting type [R11.2]    Principal Problem:   Intractable nausea and vomiting Active Problems:   HYPERTENSION, BENIGN   MGUS (monoclonal gammopathy of unknown significance)   Atrial fibrillation (HCC)   Chronic combined systolic and diastolic CHF (congestive heart failure) (HCC)   Chronic anticoagulation   CKD (chronic kidney disease) stage 4, GFR 15-29 ml/min (HCC)   Anemia of chronic disease   Dehydration   Generalized weakness      Past Medical History:  Diagnosis Date  . Anemia   . Atrial fibrillation ([place])    a. Dx 01/2014->Eliquis started.  . Cancer ([place])   . Chest pain    a. 2011 Neg MV;  b. 01/2014 Cath: LM 20-30, LAD nl, D1 nl, LCX nl, OM1 nl, RCA dom 30-52m, PD/PL nl, EF 65%->Med Rx; c. 03/2016 MV: no ischemia/infarct. EF 63%.  . Degenerative joint disease   . Diastolic CHF, acute on chronic ([place]) 01/31/2012  . Dyslipidemia   . Dysrhythmia   . Family history of adverse reaction to anesthesia    " multiple family members have difficulty waking "  . GERD (gastroesophageal reflux disease)   . History of skin cancer    tafeen/ non melanoma.   Marland Kitchen Hx of varicella   . Hypertension   . Hypothyroidism   . Monoclonal gammopathies   . Pacemaker   .  Palpitations    PVCs/bigeminy on event in May 2009 revealing this was relatively asymptomatic  . Right lower quadrant abdominal pain 07/19/2012   Recurrent  With nausea    This is new since she had ct for llq pain in [facility]    R/o appendiceal problem  Hernia  Get ct scan  And plan  Fu     . Thrombocytopenia ([place])     Past Surgical History:  Procedure Laterality Date  . CARDIOVERSION N/A 02/26/2014   Procedure: CARDIOVERSION AT BEDSIDE;  Surgeon: Pixie Casino, MD;  Location: [facility];  Service: Cardiovascular;  Laterality: N/A;  . CARDIOVERSION N/A 04/22/2014   Procedure: CARDIOVERSION (BEDSIDE);  Surgeon: [name], MD;  Location: [facility] OR;  Service: Cardiovascular;  Laterality: N/A;  . CATARACT EXTRACTION     Bilateral  implantt  . CESAREAN SECTION     times 2  . CHOLECYSTECTOMY  1999  . ESOPHAGOGASTRODUODENOSCOPY N/A 07/06/2014   Procedure: ESOPHAGOGASTRODUODENOSCOPY (EGD);  Surgeon: Ladene Artist, MD;  Location: [facility] ENDOSCOPY;  Service: Endoscopy;  Laterality:  N/A;  . INSERT / REPLACE / REMOVE PACEMAKER    . LEFT HEART CATHETERIZATION WITH CORONARY ANGIOGRAM N/A 01/29/2014   Procedure: LEFT HEART CATHETERIZATION WITH CORONARY ANGIOGRAM;  Surgeon: Blane Ohara, MD;  Location: [facility] CATH LAB;  Service: Cardiovascular;  Laterality: N/A;  . PACEMAKER INSERTION  04/23/2014   STJ Assurity dual chamber pacemaker implanted by Dr Rayann Heman  . PERMANENT PACEMAKER INSERTION N/A 04/23/2014   Procedure: PERMANENT PACEMAKER INSERTION;  Surgeon: Evans Lance, MD;  Location: [facility] CATH LAB;  Service: Cardiovascular;  Laterality: N/A;  . RIGHT HEART CATHETERIZATION N/A 09/22/2014   Procedure: RIGHT HEART CATH;  Surgeon: Jolaine Artist, MD;  Location: [facility] CATH LAB;  Service: Cardiovascular;  Laterality: N/A;  . SHOULDER SURGERY  1996   Right        History of present illness and  Hospital Course:     Kindly see H&P for history of present illness and admission details, please review complete Labs,  Consult reports and Test reports for all details in brief  HPI  from the history and physical done on the day of admission 11/18/2016  HPI: [name] is a 83 y.o. female with medical history significant for HTN, HLD, paroxysmal atrial fibrillation s/p PPM , on a diastolic heart failure, hypothyroidism, chronic kidney disease baseline creatinine around 2.2 and MGUS presents to the emergency department with the chief complaint sudden onset abdominal pain nausea and vomiting. Initial evaluation reassuring for acute abdomen, reveals moderate leukocytes in urine, and generalized weakness. Nausea/ vomiting and abdominal pain resolved on admission.  Information is obtained from the patient. She reports recently being discharged from the hospital after a 3 day stay for dehydration related to diarrhea resulting from treatment for constipation. She was eating and drinking her normal amount when she developed sudden onset moderate abdominal pain just before presenting to the emergency department. She describes the pain as sharp constant located throughout her abdomen. She reports placing a heating pad on her lower abdomen which seemed to make the pain worse. Associated symptoms include lightheadedness nausea and 3 episodes of vomiting just prior to arrival. She denies any coffee ground emesis. She also reported right sided back pain lightheadedness. He denied headache fever chills cough chest pain palpitation shortness of breath. She denied dysuria hematuria frequency or urgency. She denies diarrhea constipation melena bright red blood per rectum. She denies lower extremity edema orthopnea.    ED Course: In the emergency department she is afebrile blood pressure somewhat labile on the soft side not hypoxic nontoxic appearing. She is provided with IV fluids Rocephin and Zofran. At the time of admission patient is symptom-free requesting food but demonstrating some generalized weakness and dizziness upon  ambulation   [name] a 83 y.o.femalewith a Past Medical History sig for HTN, afib, chf, hypothyroid, chronic CKD who presents with N/V. Dx of UTI.     Intractable nausea and vomiting. -   most likely related to gastroenteritis,  versus UTI, CT of the abdomen last week no acute finding that noted fair amount of stool. Lipase within the limits . - No recurrence of vomiting during hospital stay, nausea has significantly improved over last 24 hours, currently tolerating soft diet with good oral intake, be discharged home today with recommendation to continue soft bland diet.   UTI - She reports polyuria ,treated with Rocephin, urine cultures with no growth.  Hypertension -   blood pressure acceptable on discharge, instructed to resume torsemide  in 48 hours.   Generalized weakness.  - Likely related to mild dehydration in the setting of #1 and diuretics - Currently improved, ambulating ambulating in her room with no systems  Chronic kidney disease stage III. - Baseline creatinine 2.2. Creatinine on admission close to baseline.  Atrial fibrillation with PPM.  - Rate controlled. chadvasc score 5 -EKG as noted above -Continue home medication amiodarone and eliquis - with an episode of chest pain on admission, per further questioning appears to be epigastric, troponins negative, and EKG nonacute, no recurrence during hospital stay   Chronic diastolic heart failure.  -Appears compensated on admission. Echo done March 2017 with and EF of [id number] grade 2 diastolic dysfunction., Treated to resume Demadex in 48 hours  Hypothyroidism -Continue home meds   Discharge Condition:  Stable, discussed with patient neighbor at bedside Ms Eustaquio Maize with patient's permission, the last home to spend the night with the patient for the next couple days. - Tried to call daughter via phone, no answer   Follow UP  Follow-up Information    [name],  MD Follow up in 1 week(s).   Specialties:  Internal Medicine, Pediatrics Contact information: [place] [address] [phone]             Discharge Instructions  and  Discharge Medications     Discharge Instructions    Discharge instructions    Complete by:  As directed    Follow with Primary MD [name], MD in 7 days   Get CBC, CMP,  checked  by Primary MD next visit.    Activity: As tolerated with Full fall precautions use walker/cane & assistance as needed   Disposition Home   Diet: Heart Healthy , low fat soft , with feeding assistance and aspiration precautions.   On your next visit with your primary care physician please Get Medicines reviewed and adjusted.   Please request your Prim.MD to go over all Hospital Tests and Procedure/Radiological results at the follow up, please get all Hospital records sent to your Prim MD by signing hospital release before you go home.   If you experience worsening of your admission symptoms, develop shortness of breath, life threatening emergency, suicidal or homicidal thoughts you must seek medical attention immediately by calling 911 or calling your MD immediately  if symptoms less severe.  You Must read complete instructions/literature along with all the possible adverse reactions/side effects for all the Medicines you take and that have been prescribed to you. Take any new Medicines after you have completely understood and accpet all the possible adverse reactions/side effects.   Do not drive, operating heavy machinery, perform activities at heights, swimming or participation in water activities or provide baby sitting services if your were admitted for syncope or siezures until you have seen by Primary MD or a Neurologist and advised to do so again.  Do not drive when taking Pain medications.    Do not take more than prescribed Pain, Sleep and Anxiety Medications  Special Instructions:  If you have smoked or chewed Tobacco  in the last 2 yrs please stop smoking, stop any regular Alcohol  and or any Recreational drug use.  Wear Seat belts while driving.   Please note  You were cared for by a hospitalist during your hospital stay. If you have any questions about your discharge medications or the care you received while you were in the hospital after you are discharged, you can call the unit and asked  to speak with the hospitalist on call if the hospitalist that took care of you is not available. Once you are discharged, your primary care physician will handle any further medical issues. Please note that NO REFILLS for any discharge medications will be authorized once you are discharged, as it is imperative that you return to your primary care physician (or establish a relationship with a primary care physician if you do not have one) for your aftercare needs so that they can reassess your need for medications and monitor your lab values.   Increase activity slowly    Complete by:  As directed      Allergies as of 11/20/2016      Reactions   Other Shortness Of Breath   Detergents, perfumes and other strong odor emitting compounds   Tramadol Shortness Of Breath, Nausea Only   Ibuprofen Other (See Comments)   Told not to take med      Medication List    TAKE these medications   amiodarone 200 MG tablet Commonly known as:  PACERONE TAKE ONE TABLET BY MOUTH ONCE DAILY   denosumab 60 MG/ML Soln injection Commonly known as:  PROLIA Inject 60 mg into the skin every 6 (six) months. Administer in upper arm, thigh, or abdomen   ELIQUIS 2.5 MG Tabs tablet Generic drug:  apixaban TAKE ONE TABLET BY MOUTH TWICE DAILY   erythromycin ophthalmic ointment Place 1 application into both eyes 2 (two) times daily.   Iron 325 (65 Fe) MG Tabs Take 1 tablet by mouth daily.   levothyroxine 75 MCG tablet Commonly known as:  SYNTHROID, LEVOTHROID TAKE ONE TABLET BY MOUTH ONCE DAILY     meclizine 12.5 MG tablet Commonly known as:  ANTIVERT Take 1 tablet (12.5 mg total) by mouth 2 (two) times daily as needed for dizziness.   metolazone 2.5 MG tablet Commonly known as:  ZAROXOLYN Take 1 tablet (2.5 mg total) by mouth as needed (for weight 134 lb or greater).   nitroGLYCERIN 0.4 MG SL tablet Commonly known as:  NITROSTAT Place 1 tablet (0.4 mg total) under the tongue every 5 (five) minutes as needed. For chest pain   oxyCODONE-acetaminophen 5-325 MG tablet Commonly known as:  PERCOCET/ROXICET Take 1 tablet by mouth daily as needed for pain.   pantoprazole 40 MG tablet Commonly known as:  PROTONIX Take 40 mg by mouth daily.   potassium chloride SA 20 MEQ tablet Commonly known as:  K-DUR,KLOR-CON Take 2 tablets (40 mEq total) by mouth daily. Take an additional 44meq with Metolazone.   senna 8.6 MG Tabs tablet Commonly known as:  SENOKOT Take 2 tablets (17.2 mg total) by mouth at bedtime.   simvastatin 10 MG tablet Commonly known as:  ZOCOR TAKE ONE TABLET BY MOUTH ONCE DAILY AT BEDTIME   torsemide 20 MG tablet Commonly known as:  DEMADEX Take 2 tablets (40 mg total) by mouth daily. Please start on 11/22/2016 Start taking on:  11/22/2016 What changed:  additional instructions         Diet and Activity recommendation: See Discharge Instructions above   Consults obtained -  None   Major procedures and Radiology Reports - PLEASE review detailed and final reports for all details, in brief -      Ct Abdomen Pelvis Wo Contrast  Result Date: 11/11/2016 CLINICAL DATA:  Abdominal pain.  Constipation.  Nausea up. EXAM: CT ABDOMEN AND PELVIS WITHOUT CONTRAST TECHNIQUE: Multidetector CT imaging of the abdomen and pelvis was performed following the standard  protocol without IV contrast. COMPARISON:  05/02/2016 FINDINGS: Lower chest: Chronic scarring at the lung bases. No pleural or pericardial fluid. Hepatobiliary: Normal without contrast.  Previous  cholecystectomy. Pancreas: Normal Spleen: Normal Adrenals/Urinary Tract: Adrenal glands are normal. Right kidney is normal except for 1 cm cyst at the lower pole. Left kidney contains multiple parapelvic cysts, the largest in the lower pole measuring 5 cm. Stomach/Bowel: Administered contrast has passed all the way through the small intestine and into the right colon, beginning to pass into the left colon. The patient does have a large amount of stool in the colon, but not extraordinarily so. No sign of true obstruction or focal lesion. Diverticulosis without evidence of diverticulitis. Vascular/Lymphatic: Aortic atherosclerosis. IVC is normal. No retroperitoneal mass or adenopathy. Reproductive: Negative Other: No ascites or free air. Musculoskeletal: Chronic curvature in degenerative change. IMPRESSION: No acute finding by CT. The patient does have a fairly large amount of stool in the colon, but not extraordinarily so. Administered contrast has passed well into the colon. No bowel obstruction. No acute organ pathology. Electronically Signed   By: Nelson Chimes M.D.   On: 11/11/2016 21:59   Dg Chest 2 View  Result Date: 11/11/2016 CLINICAL DATA:  Chest pain and shortness of breath. EXAM: CHEST  2 VIEW COMPARISON:  Radiographs 11/02/2016 FINDINGS: Right-sided pacemaker with leads overlying the right atrium and ventricle. There is stable cardiomegaly. Atherosclerosis of the thoracic aorta. Decreased pulmonary edema with possible minimal residual. No focal airspace disease or pleural fluid. Again seen elevation of left hemidiaphragm. Air-filled colon under the right hemidiaphragm. Surgical anchor in the right shoulder. IMPRESSION: Decreased pulmonary edema with minimal residual. Stable cardiomegaly. Aortic atherosclerosis. Electronically Signed   By: Jeb Levering M.D.   On: 11/11/2016 21:06   Dg Chest 2 View  Result Date: 11/02/2016 CLINICAL DATA:  83 year old female with chest pain and nausea. EXAM:  CHEST  2 VIEW COMPARISON:  Multiple chest radiographs dating back to 03/31/2016 FINDINGS: Mild cardiomegaly with hilar prominence and perihilar haziness compatible with vascular congestion. No definite interstitial edema. Bibasilar, left greater right hazy airspace densities may represent atelectatic changes or developing infiltrate. There is no pleural effusion or pneumothorax. Right pectoral dual lead pacemaker device noted. There is focal area of slight kink in the pacemaker wires in the right intercostal space. The wires however remains intact. There is atherosclerotic calcification of the aortic arch. There is osteopenia with degenerative changes of the spine. No acute fracture. IMPRESSION: Cardiomegaly with vascular congestion. Bilateral lower lung field hazy densities may represent atelectasis versus infiltrate. Clinical correlation is recommended. Electronically Signed   By: Anner Crete M.D.   On: 11/02/2016 05:21   Dg Abd Acute W/chest  Result Date: 11/18/2016 CLINICAL DATA:  Right lower quadrant abdominal pain, onset 2 hours prior to arrival. Nausea vomiting and diarrhea. EXAM: DG ABDOMEN ACUTE W/ 1V CHEST COMPARISON:  11/11/2016 FINDINGS: The abdominal gas pattern is negative for obstruction or perforation. Stool and air is present throughout the colon. Right upper quadrant cholecystectomy clips noted. No biliary or urinary calculi are evident. The upright view of the chest is negative for acute cardiopulmonary abnormality, with stable mild cardiomegaly and unchanged left hemidiaphragm elevation. IMPRESSION: Negative abdominal radiographs.  No acute cardiopulmonary disease. Electronically Signed   By: Andreas Newport M.D.   On: 11/18/2016 00:50    Micro Results    Recent Results (from the past 240 hour(s))  Urine culture     Status: None   Collection Time: 11/18/16  5:39 AM  Result Value Ref Range Status   Specimen Description URINE, RANDOM  Final   Special Requests NONE  Final    Culture NO GROWTH  Final   Report Status 11/19/2016 FINAL  Final       Today   Subjective:   [name] today has no headache,no chest or abdominal pain, no nausea or vomiting, tolerating oral intake, no new weakness tingling or numbness, feels much better wants to go home today.   Objective:   Blood pressure (!) 111/51, pulse 74, temperature 98.1 F (36.7 C), temperature source Oral, resp. rate 17, weight 62.3 kg (137 lb 4.8 oz), SpO2 92 %.   Intake/Output Summary (Last 24 hours) at 11/20/16 1048 Last data filed at 11/20/16 1032  Gross per 24 hour  Intake              645 ml  Output                0 ml  Net              645 ml    Exam Awake Alert, Oriented x 3, No new F.N deficits, Normal affect [place].AT,PERRAL Supple Neck,No JVD, No cervical lymphadenopathy appriciated.  Symmetrical Chest wall movement, Good air movement bilaterally, CTAB RRR,No Gallops,Rubs or new Murmurs, No Parasternal Heave +ve B.Sounds, Abd Soft, Non tender, No organomegaly appriciated, No rebound -guarding or rigidity. No Cyanosis, Clubbing or edema, No new Rash or bruise  Data Review   CBC w Diff: Lab Results  Component Value Date   WBC 3.9 (L) 11/19/2016   HGB 13.3 11/19/2016   HGB 12.5 07/30/2013   HCT 42.0 11/19/2016   HCT 37.5 07/30/2013   PLT 112 (L) 11/19/2016   PLT 153 07/30/2013   LYMPHOPCT 3 11/17/2016   LYMPHOPCT 27.7 07/30/2013   BANDSPCT PENDING 11/01/2013   MONOPCT 3 11/17/2016   MONOPCT 7.8 07/30/2013   EOSPCT 0 11/17/2016   EOSPCT 2.9 07/30/2013   BASOPCT 0 11/17/2016   BASOPCT 1.4 07/30/2013    CMP: Lab Results  Component Value Date   NA 138 11/19/2016   NA 142 07/30/2013   K 4.7 11/19/2016   K 4.3 07/30/2013   CL 104 11/19/2016   CL 107 01/11/2013   CO2 25 11/19/2016   CO2 28 07/30/2013   BUN 22 (H) 11/19/2016   BUN 21.4 07/30/2013   CREATININE 1.61 (H) 11/19/2016   CREATININE 2.63 (H) 04/04/2016   CREATININE 1.0 07/30/2013   PROT 6.7 11/17/2016    PROT 6.6 07/30/2013   ALBUMIN 3.6 11/17/2016   ALBUMIN 3.3 (L) 07/30/2013   BILITOT 0.5 11/17/2016   BILITOT 0.58 07/30/2013   ALKPHOS 64 11/17/2016   ALKPHOS 89 07/30/2013   AST 23 11/17/2016   AST 15 07/30/2013   ALT 20 11/17/2016   ALT 12 07/30/2013  .   Total Time in preparing paper work, data evaluation and todays exam - 35 minutes  Braniyah Besse M.D on 11/20/2016 at 10:48 AM  [facility]   Office  [phone]

## 2016-11-20 NOTE — Discharge Instructions (Signed)
Follow with Primary MD [name], MD in 7 days   Get CBC, CMP,  checked  by Primary MD next visit.    Activity: As tolerated with Full fall precautions use walker/cane & assistance as needed   Disposition Home   Diet: Heart Healthy , low fat soft , with feeding assistance and aspiration precautions.   On your next visit with your primary care physician please Get Medicines reviewed and adjusted.   Please request your Prim.MD to go over all Hospital Tests and Procedure/Radiological results at the follow up, please get all Hospital records sent to your Prim MD by signing hospital release before you go home.   If you experience worsening of your admission symptoms, develop shortness of breath, life threatening emergency, suicidal or homicidal thoughts you must seek medical attention immediately by calling 911 or calling your MD immediately  if symptoms less severe.  You Must read complete instructions/literature along with all the possible adverse reactions/side effects for all the Medicines you take and that have been prescribed to you. Take any new Medicines after you have completely understood and accpet all the possible adverse reactions/side effects.   Do not drive, operating heavy machinery, perform activities at heights, swimming or participation in water activities or provide baby sitting services if your were admitted for syncope or siezures until you have seen by Primary MD or a Neurologist and advised to do so again.  Do not drive when taking Pain medications.    Do not take more than prescribed Pain, Sleep and Anxiety Medications  Special Instructions: If you have smoked or chewed Tobacco  in the last 2 yrs please stop smoking, stop any regular Alcohol  and or any Recreational drug use.  Wear Seat belts while driving.   Please note  You were cared for by a hospitalist during your hospital stay. If you have any questions about your discharge medications or the  care you received while you were in the hospital after you are discharged, you can call the unit and asked to speak with the hospitalist on call if the hospitalist that took care of you is not available. Once you are discharged, your primary care physician will handle any further medical issues. Please note that NO REFILLS for any discharge medications will be authorized once you are discharged, as it is imperative that you return to your primary care physician (or establish a relationship with a primary care physician if you do not have one) for your aftercare needs so that they can reassess your need for medications and monitor your lab values.

## 2016-11-21 ENCOUNTER — Telehealth: Payer: Self-pay

## 2016-11-21 NOTE — Telephone Encounter (Signed)
LMTCB

## 2016-11-22 ENCOUNTER — Ambulatory Visit: Payer: Medicare Other | Admitting: Internal Medicine

## 2016-11-25 ENCOUNTER — Other Ambulatory Visit (HOSPITAL_COMMUNITY): Payer: Self-pay | Admitting: *Deleted

## 2016-11-25 MED ORDER — AMIODARONE HCL 200 MG PO TABS: 200.0000 mg | ORAL_TABLET | Freq: Every day | ORAL | 3 refills | Status: AC

## 2016-11-25 NOTE — Telephone Encounter (Signed)
D/C 1/7 To: home   Spoke with pt and she states that she feels weak but is doing well. She has family and neighbors that check on her daily. Family members come about 3 days a week to help with cleaning, laundry and shopping.   Appt 11/28/16 with Dr Regis Bill. Pt aware.    Transition Care Management Follow-up Telephone Call  How have you been since you were released from the hospital? Weak but good   Do you understand why you were in the hospital? yes   Do you understand the discharge instrcutions? yes  Items Reviewed:  Medications reviewed: yes  Allergies reviewed: yes  Dietary changes reviewed: yes  Referrals reviewed: yes   Functional Questionnaire:   Activities of Daily Living (ADLs):   She states they are independent in the following: ambulation, bathing and hygiene, feeding, continence, grooming, toileting and dressing States they require assistance with the following: transportation, shopping   Any transportation issues/concerns?: no   Any patient concerns? no   Confirmed importance and date/time of follow-up visits scheduled: yes   Confirmed with patient if condition begins to worsen call PCP or go to the ER.  Patient was given the Call-a-Nurse line [phone]: yes

## 2016-11-25 NOTE — Progress Notes (Signed)
Pre visit review using our clinic review tool, if applicable. No additional management support is needed unless otherwise documented below in the visit note.  Chief Complaint  Patient presents with  . Hospitalization Follow-up    HPI:  [name] 83 y.o. with a history of multiple ED visits and recent hospitalizations 3 hospitalizations in 5 ED visits in the last 6 months. Comes in for fu hosp  She is here with her family member .   She has a history of chest pain noncardiac in cardiac diastolic CHF remote history of A. fib GERD kyphosis  And most recently 12/29  And Jan dc 1/4 acute kidney injury and one for dehydration and intractable vomiting and nausea.feltto be  A gi bug .   Her primary medication prescribers are with are  cardiology  as she is in the CHF clinic. And seems stable   Her last visit in the office with me in October 2017 for groin pain.   She was rx for uti with rocephin  And  Ur cx was NG   Bu hua had pyuria little bactyeria   Below is her last hosp admission  Note  ROS: See pertinent positives and negatives per HPI. No falling appetite is not yet back to normal. But no vomiting or diarrhea. No fever or increased swelling in her legs. No dizziness when she sits but which gets up and moves around can feel weak. No syncope. Bottom area tailbone appears to feels to be source that she might need to sit on a cushion.  She lives land but has multiple helpers in her neighborhood as well as her family just about call away.  Admission date:  11/17/2016  Admitting Physician  Norval Morton, MD  Discharge Date:  11/20/2016   Primary MD  [name], MD  Recommendations for primary care physician for things to follow:  - Please check CBC, BMP during next visit   Admission Diagnosis  Dehydration [E86.0] Weakness [R53.1] Acute cystitis without hematuria [N30.00] Intractable vomiting with nausea, unspecified vomiting type [R11.2]   Discharge  Diagnosis  Dehydration [E86.0] Weakness [R53.1] Acute cystitis without hematuria [N30.00] Intractable vomiting with nausea, unspecified vomiting type [R11.2]    Principal Problem:   Intractable nausea and vomiting Active Problems:   HYPERTENSION, BENIGN   MGUS (monoclonal gammopathy of unknown significance)   Atrial fibrillation (HCC)   Chronic combined systolic and diastolic CHF (congestive heart failure) (HCC)   Chronic anticoagulation   CKD (chronic kidney disease) stage 4, GFR 15-29 ml/min (HCC)   Anemia of chronic disease   Dehydration   Generalized weakness       Past Medical History:  Diagnosis Date  . Anemia   . Atrial fibrillation ([place])    a. Dx 01/2014->Eliquis started.  . Cancer ([place])   . Chest pain    a. 2011 Neg MV;  b. 01/2014 Cath: LM 20-30, LAD nl, D1 nl, LCX nl, OM1 nl, RCA dom 30-25m, PD/PL nl, EF 65%->Med Rx; c. 03/2016 MV: no ischemia/infarct. EF 63%.  . Degenerative joint disease   . Diastolic CHF, acute on chronic ([place]) 01/31/2012  . Dyslipidemia   . Dysrhythmia   . Family history of adverse reaction to anesthesia    " multiple family members have difficulty waking "  . GERD (gastroesophageal reflux disease)   . History of skin cancer    tafeen/ non melanoma.   Marland Kitchen Hx of varicella   . Hypertension   . Hypothyroidism   .  Monoclonal gammopathies   . Pacemaker   . Palpitations    PVCs/bigeminy on event in May 2009 revealing this was relatively asymptomatic  . Right lower quadrant abdominal pain 07/19/2012   Recurrent  With nausea    This is new since she had ct for llq pain in [facility]    R/o appendiceal problem  Hernia  Get ct scan  And plan  Fu     . Thrombocytopenia ([place])     Family History  Problem Relation Age of Onset  . Coronary artery disease Father   . Heart disease Father   . Heart attack Father   . Alzheimer's disease Mother   . Lung cancer Brother 71  . Lung cancer      Social History   Social History  . Marital status: Widowed     Spouse name: N/A  . Number of children: 3  . Years of education: N/A   Occupational History  .  Retired    [employer], book keeping   Social History Main Topics  . Smoking status: Never Smoker  . Smokeless tobacco: Never Used  . Alcohol use No  . Drug use: No  . Sexual activity: No   Other Topics Concern  . 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 Eats with her.      Daughterrenee handles medications. Sees HH and PT once a week.   Helper  Lorre Nick t the Friday 1-3 pm    Outpatient Medications Prior to Visit  Medication Sig Dispense Refill  . amiodarone (PACERONE) 200 MG tablet Take 1 tablet (200 mg total) by mouth daily. 90 tablet 3  . denosumab (PROLIA) 60 MG/ML SOLN injection Inject 60 mg into the skin every 6 (six) months. Administer in upper arm, thigh, or abdomen    . ELIQUIS 2.5 MG TABS tablet TAKE ONE TABLET BY MOUTH TWICE DAILY 60 tablet 6  . erythromycin ophthalmic ointment Place 1 application into both eyes 2 (two) times daily.     . Ferrous Sulfate (IRON) 325 (65 Fe) MG TABS Take 1 tablet by mouth daily. 180 each 0  . levothyroxine (SYNTHROID, LEVOTHROID) 75 MCG tablet TAKE ONE TABLET BY MOUTH ONCE DAILY 90 tablet 3  . meclizine (ANTIVERT) 12.5 MG tablet Take 1 tablet (12.5 mg total) by mouth 2 (two) times daily as needed for dizziness. 10 tablet 0  . metolazone (ZAROXOLYN) 2.5 MG tablet Take 1 tablet (2.5 mg total) by mouth as needed (for weight 134 lb or greater). 10 tablet 3  . nitroGLYCERIN (NITROSTAT) 0.4 MG SL tablet Place 1 tablet (0.4 mg total) under the tongue every 5 (five) minutes as needed. For chest pain 25 tablet 6  . pantoprazole (PROTONIX) 40 MG tablet Take 40 mg by mouth daily.    . potassium chloride SA (K-DUR,KLOR-CON) 20 MEQ  tablet Take 2 tablets (40 mEq total) by mouth daily. Take an additional 53meq with Metolazone. 90 tablet 2  . senna (SENOKOT) 8.6 MG TABS tablet Take 2 tablets (17.2 mg total) by mouth at bedtime. 60 each 0  . simvastatin (ZOCOR) 10 MG tablet TAKE ONE TABLET BY MOUTH ONCE DAILY AT BEDTIME 30  tablet 3  . torsemide (DEMADEX) 20 MG tablet Take 2 tablets (40 mg total) by mouth daily. Please start on 11/22/2016 60 tablet 6  . oxyCODONE-acetaminophen (PERCOCET/ROXICET) 5-325 MG tablet Take 1 tablet by mouth daily as needed for pain.  0   No facility-administered medications prior to visit.      EXAM:  BP 136/84 (BP Location: Left Arm, Patient Position: Sitting, Cuff Size: Normal)   Temp 98.2 F (36.8 C) (Oral)   Ht 4\' 10"  (1.473 m)   Wt 130 lb (59 kg)   BMI 27.17 kg/m   Body mass index is 27.17 kg/m.  GENERAL: vitals reviewed and listed above, alert, oriented, appears well hydrated and in no acute distressAppears to be cognitively intact with her major kyphosis. HEENT: atraumatic, conjunctiva  clear, no obvious abnormalities on inspection of external nose and ears   NECK: no obvious masses on inspection palpation  LUNGS: clear to auscultation bilaterally, no wheezes, rales or rhonchi,CV: HRRR, no clubbing cyanosis or  peripheral edema nl cap refill  Abdomen soft without organomegaly guarding or rebound obvious. Observation of tail bone area there is no test or ulcer or skin breakdown there may be a central pore over her coccyx. MS: moves all extremities walks independently and with a cane. PSYCH: pleasant and cooperative, no obvious depression or anxiety Lab Results  Component Value Date   WBC 5.2 11/28/2016   HGB 14.0 11/28/2016   HCT 42.2 11/28/2016   PLT 160.0 11/28/2016   GLUCOSE 91 11/28/2016   CHOL 141 04/14/2014   TRIG 56 04/14/2014   HDL 89 04/14/2014   LDLCALC 41 04/14/2014   ALT 21 11/28/2016   AST 20 11/28/2016   NA 140 11/28/2016   K 4.5 11/28/2016   CL 102  11/28/2016   CREATININE 1.93 (H) 11/28/2016   BUN 28 (H) 11/28/2016   CO2 31 11/28/2016   TSH 1.472 03/31/2016   INR 1.30 11/02/2016   Wt Readings from Last 3 Encounters:  11/28/16 130 lb (59 kg)  11/20/16 137 lb 4.8 oz (62.3 kg)  11/13/16 133 lb 9.7 oz (60.6 kg)   BP Readings from Last 3 Encounters:  11/28/16 136/84  11/20/16 (!) 111/51  11/13/16 (!) 103/59    ASSESSMENT AND PLAN:  Discussed the following assessment and plan:  Thrombocytopenia (HCC) - no bruising rehceck level  - Plan: CMP, CBC with Differential/Platelet, CANCELED: POCT Urinalysis, Dipstick  CKD (chronic kidney disease) stage 4, GFR 15-29 ml/min (HCC) - Plan: CMP, CBC with Differential/Platelet, POCT Urinalysis Dipstick (Automated), CANCELED: POCT Urinalysis, Dipstick  Chronic combined systolic and diastolic CHF (congestive heart failure) ([place]) - Plan: CMP, CBC with Differential/Platelet, CANCELED: POCT Urinalysis, Dipstick  Hospital discharge follow-up  Vitamin D deficiency - Plan: VITAMIN D 25 Hydroxy (Vit-D Deficiency, Fractures)  History of nausea and vomiting  Chronic anticoagulation - Plan: CMP, CBC with Differential/Platelet, CANCELED: POCT Urinalysis, Dipstick  Osteoporosis, unspecified osteoporosis type, unspecified pathological fracture presence  Advanced age Follow-up labs and also vitamin D level because she is on presently and to get her injection at the end of the week. I expect her to improve also in the next few weeks. Heart failure appears to be stable. Follow-up in 3 months unless having difficulties before then. Weight loss noted. -Patient advised to return or notify health care team  if symptoms worsen ,persist or new concerns arise.  Patient Instructions  Expect her appetite is slightly improved. We'll let you know results when they're available they will be all my  chart. This will include kidney function calcium and liver and blood count. We'll also get a urinalysis  today. Depending on how you were doing follow-up visit in 3 months.    Standley Brooking. Jasmeet Gehl M.D.

## 2016-11-28 ENCOUNTER — Encounter: Payer: Self-pay | Admitting: Internal Medicine

## 2016-11-28 ENCOUNTER — Ambulatory Visit (INDEPENDENT_AMBULATORY_CARE_PROVIDER_SITE_OTHER): Payer: Medicare Other | Admitting: Internal Medicine

## 2016-11-28 VITALS — BP 136/84 | Temp 98.2°F | Ht <= 58 in | Wt 130.0 lb

## 2016-11-28 DIAGNOSIS — E559 Vitamin D deficiency, unspecified: Secondary | ICD-10-CM

## 2016-11-28 DIAGNOSIS — Z87898 Personal history of other specified conditions: Secondary | ICD-10-CM

## 2016-11-28 DIAGNOSIS — Z09 Encounter for follow-up examination after completed treatment for conditions other than malignant neoplasm: Secondary | ICD-10-CM

## 2016-11-28 DIAGNOSIS — M810 Age-related osteoporosis without current pathological fracture: Secondary | ICD-10-CM | POA: Diagnosis not present

## 2016-11-28 DIAGNOSIS — I5042 Chronic combined systolic (congestive) and diastolic (congestive) heart failure: Secondary | ICD-10-CM | POA: Diagnosis not present

## 2016-11-28 DIAGNOSIS — D696 Thrombocytopenia, unspecified: Secondary | ICD-10-CM

## 2016-11-28 DIAGNOSIS — Z7901 Long term (current) use of anticoagulants: Secondary | ICD-10-CM | POA: Diagnosis not present

## 2016-11-28 DIAGNOSIS — R54 Age-related physical debility: Secondary | ICD-10-CM | POA: Diagnosis not present

## 2016-11-28 DIAGNOSIS — N184 Chronic kidney disease, stage 4 (severe): Secondary | ICD-10-CM | POA: Diagnosis not present

## 2016-11-28 LAB — COMPREHENSIVE METABOLIC PANEL
ALBUMIN: 3.7 g/dL (ref 3.5–5.2)
ALK PHOS: 74 U/L (ref 39–117)
ALT: 21 U/L (ref 0–35)
AST: 20 U/L (ref 0–37)
BILIRUBIN TOTAL: 0.7 mg/dL (ref 0.2–1.2)
BUN: 28 mg/dL — AB (ref 6–23)
CALCIUM: 9.9 mg/dL (ref 8.4–10.5)
CHLORIDE: 102 meq/L (ref 96–112)
CO2: 31 mEq/L (ref 19–32)
CREATININE: 1.93 mg/dL — AB (ref 0.40–1.20)
GFR: 25.94 mL/min — ABNORMAL LOW (ref >60.00)
Glucose, Bld: 91 mg/dL (ref 70–99)
Potassium: 4.5 mEq/L (ref 3.5–5.1)
SODIUM: 140 meq/L (ref 135–145)
TOTAL PROTEIN: 6.8 g/dL (ref 6.0–8.3)

## 2016-11-28 LAB — CBC WITH DIFFERENTIAL/PLATELET
BASOS ABS: 0 10*3/uL (ref 0.0–0.1)
Basophils Relative: 0.8 % (ref 0.0–3.0)
EOS ABS: 0.1 10*3/uL (ref 0.0–0.7)
Eosinophils Relative: 1.1 % (ref 0.0–5.0)
HEMATOCRIT: 42.2 % (ref 36.0–46.0)
HEMOGLOBIN: 14 g/dL (ref 12.0–15.0)
LYMPHS PCT: 17 % (ref 12.0–46.0)
Lymphs Abs: 0.9 10*3/uL (ref 0.7–4.0)
MCHC: 33.1 g/dL (ref 30.0–36.0)
MCV: 93.7 fl (ref 78.0–100.0)
MONO ABS: 0.5 10*3/uL (ref 0.1–1.0)
Monocytes Relative: 9 % (ref 3.0–12.0)
Neutro Abs: 3.8 10*3/uL (ref 1.4–7.7)
Neutrophils Relative %: 72.1 % (ref 43.0–77.0)
Platelets: 160 10*3/uL (ref 150.0–400.0)
RBC: 4.51 Mil/uL (ref 3.87–5.11)
RDW: 14.8 % (ref 11.5–15.5)
WBC: 5.2 10*3/uL (ref 4.0–10.5)

## 2016-11-28 LAB — POC URINALSYSI DIPSTICK (AUTOMATED)
Bilirubin, UA: NEGATIVE
Blood, UA: NEGATIVE
Glucose, UA: NEGATIVE
KETONES UA: NEGATIVE
LEUKOCYTES UA: NEGATIVE
Nitrite, UA: NEGATIVE
PH UA: 6.5
PROTEIN UA: NEGATIVE
Spec Grav, UA: 1.015
UROBILINOGEN UA: 0.2

## 2016-11-28 LAB — VITAMIN D 25 HYDROXY (VIT D DEFICIENCY, FRACTURES): VITD: 38.94 ng/mL (ref 30.00–100.00)

## 2016-11-28 NOTE — Patient Instructions (Signed)
Expect her appetite is slightly improved. We'll let you know results when they're available they will be all my chart. This will include kidney function calcium and liver and blood count. We'll also get a urinalysis today. Depending on how you were doing follow-up visit in 3 months.

## 2016-12-17 ENCOUNTER — Other Ambulatory Visit: Payer: Self-pay | Admitting: Cardiology

## 2017-01-20 ENCOUNTER — Encounter: Payer: Self-pay | Admitting: Internal Medicine

## 2017-01-20 ENCOUNTER — Ambulatory Visit (INDEPENDENT_AMBULATORY_CARE_PROVIDER_SITE_OTHER): Payer: Medicare Other | Admitting: Internal Medicine

## 2017-01-20 VITALS — BP 102/64 | HR 68 | Temp 97.8°F | Wt 123.0 lb

## 2017-01-20 DIAGNOSIS — Z7901 Long term (current) use of anticoagulants: Secondary | ICD-10-CM | POA: Diagnosis not present

## 2017-01-20 DIAGNOSIS — Z9189 Other specified personal risk factors, not elsewhere classified: Secondary | ICD-10-CM | POA: Diagnosis not present

## 2017-01-20 DIAGNOSIS — Z9181 History of falling: Secondary | ICD-10-CM

## 2017-01-20 DIAGNOSIS — I8393 Asymptomatic varicose veins of bilateral lower extremities: Secondary | ICD-10-CM

## 2017-01-20 DIAGNOSIS — R0789 Other chest pain: Secondary | ICD-10-CM | POA: Diagnosis not present

## 2017-01-20 DIAGNOSIS — R634 Abnormal weight loss: Secondary | ICD-10-CM | POA: Diagnosis not present

## 2017-01-20 NOTE — Progress Notes (Signed)
Chief Complaint  Patient presents with  . Fall    tightness on chest,right hip pain and left shoulder pain    HPI: [name] 83 y.o. comes in today  With attendant  for an acute visit because she fell 5 and half days ago at home and she was bending over reading directions trying to fix something on TV and fell forward. She called her neighbors for help had no active bleeding or headaches and yesterday saw Dr. Layne Benton who did an x-ray of her right hip which was normal. However a [employer] nurse because she has heart failure have routinely called her today and because of this told her she could be risk of getting pneumonia and should come to the office. She's had a minor cough no fever. She does report that more recently she has been a little bit foggy occasionally lightheaded when she stands is back to the roller as opposed to the cane. She is not on a pain pill? Dr. Layne Benton is given her a muscle relaxant she does not know the name and she takes it occasionally.  ROS: See pertinent positives and negatives per HPI. No sob  [name] had twing of  Cp  Has left arm shoudler lesion  Cancer to be taken off  Has left shoulder pain but okl rom . Has had some weight loss no swelling has  Varicose veins that ache sometimes left more than right no bruising bleeding  Past Medical History:  Diagnosis Date  . Anemia   . Atrial fibrillation ([name])    a. Dx 01/2014->Eliquis started.  . Cancer ([place])   . Chest pain    a. 2011 Neg MV;  b. 01/2014 Cath: LM 20-30, LAD nl, D1 nl, LCX nl, OM1 nl, RCA dom 30-73m, PD/PL nl, EF 65%->Med Rx; c. 03/2016 MV: no ischemia/infarct. EF 63%.  . Degenerative joint disease   . Diastolic CHF, acute on chronic ([place]) 01/31/2012  . Dyslipidemia   . Dysrhythmia   . Family history of adverse reaction to anesthesia    " multiple family members have difficulty waking "  . GERD (gastroesophageal reflux disease)   . History of skin cancer    tafeen/ non melanoma.   Marland Kitchen Hx of  varicella   . Hypertension   . Hypothyroidism   . Monoclonal gammopathies   . Pacemaker   . Palpitations    PVCs/bigeminy on event in May 2009 revealing this was relatively asymptomatic  . Right lower quadrant abdominal pain 07/19/2012   Recurrent  With nausea    This is new since she had ct for llq pain in [facility]    R/o appendiceal problem  Hernia  Get ct scan  And plan  Fu     . Thrombocytopenia ([place])     Family History  Problem Relation Age of Onset  . Coronary artery disease Father   . Heart disease Father   . Heart attack Father   . Alzheimer's disease Mother   . Lung cancer Brother 33  . Lung cancer      Social History   Social History  . Marital status: Widowed    Spouse name: N/A  . Number of children: 3  . Years of education: N/A   Occupational History  .  Retired    [employer], book keeping   Social History Main Topics  . Smoking status: Never Smoker  . Smokeless tobacco: Never Used  . Alcohol use No  . Drug use: No  .  Sexual activity: No   Other Topics Concern  . 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 Eats with her.      Daughterrenee handles medications. Sees HH and PT once a week.   Helper  [name] t the Friday 1-3 pm    Outpatient Medications Prior to Visit  Medication Sig Dispense Refill  . amiodarone (PACERONE) 200 MG tablet Take 1 tablet (200 mg total) by mouth daily. 90 tablet 3  . denosumab (PROLIA) 60 MG/ML SOLN injection Inject 60 mg into the skin every 6 (six) months. Administer in upper arm, thigh, or abdomen    . ELIQUIS 2.5 MG TABS tablet TAKE ONE TABLET BY MOUTH TWICE DAILY 60 tablet 6  . erythromycin ophthalmic ointment Place 1 application into both eyes 2 (two) times daily.     . Ferrous Sulfate (IRON) 325  (65 Fe) MG TABS Take 1 tablet by mouth daily. 180 each 0  . HYDROcodone-acetaminophen (NORCO/VICODIN) 5-325 MG tablet   0  . levothyroxine (SYNTHROID, LEVOTHROID) 75 MCG tablet TAKE ONE TABLET BY MOUTH ONCE DAILY 90 tablet 3  . meclizine (ANTIVERT) 12.5 MG tablet Take 1 tablet (12.5 mg total) by mouth 2 (two) times daily as needed for dizziness. 10 tablet 0  . metolazone (ZAROXOLYN) 2.5 MG tablet Take 1 tablet (2.5 mg total) by mouth as needed (for weight 134 lb or greater). 10 tablet 3  . nitroGLYCERIN (NITROSTAT) 0.4 MG SL tablet Place 1 tablet (0.4 mg total) under the tongue every 5 (five) minutes as needed. For chest pain 25 tablet 6  . pantoprazole (PROTONIX) 40 MG tablet Take 40 mg by mouth daily.    . potassium chloride SA (K-DUR,KLOR-CON) 20 MEQ tablet Take 2 tablets (40 mEq total) by mouth daily. Take an additional 61meq with Metolazone. 90 tablet 2  . senna (SENOKOT) 8.6 MG TABS tablet Take 2 tablets (17.2 mg total) by mouth at bedtime. 60 each 0  . simvastatin (ZOCOR) 10 MG tablet TAKE ONE TABLET BY MOUTH ONCE DAILY AT BEDTIME 30 tablet 3  . torsemide (DEMADEX) 20 MG tablet Take 2 tablets (40 mg total) by mouth daily. Please start on 11/22/2016 60 tablet 6  . torsemide (DEMADEX) 20 MG tablet TAKE TWO TABLETS BY MOUTH DAILY 60 tablet 6   No facility-administered medications prior to visit.      EXAM:  BP 102/64 (BP Location: Left Arm, Patient Position: Sitting, Cuff Size: Normal)   Pulse 68   Temp 97.8 F (36.6 C) (Oral)   Wt 123 lb (55.8 kg)   SpO2 97%   BMI 25.71 kg/m   Body mass index is 25.71 kg/m.  GENERAL: vitals reviewed and listed above, alert, oriented,  Using rollater  Kyphosis   A bit tired but  appears well hydrated and in no acute distress no sob dyspnea   Here with attendant ( who is with her 3 days per week) HEENT: atraumatic, conjunctiva  clear, no obvious abnormalities on inspection of external nose and ears OP : no lesion edema or exudate  NECK: no obvious  masses on inspection palpation  LUNGS: clear to auscultation bilaterally, no wheezes, rales or rhonchi, good air  movementkyphosis  CV: HRRR, no clubbing cyanosis or  peripheral edema nl cap refill   Large VV lef tmore than right but no skin changes  Or ulcer  MS: moves all extremities  Slight dec rom  Left shoulder   Skin no active bruising  Left scaly area left  Shoulder  Hands oa changes  Redness distally  PSYCH: pleasant and cooperative,  Appears a bit down bemoaning the fact that she is not feeling as well as before.  No cough at all during entire visit  Wt Readings from Last 3 Encounters:  01/20/17 123 lb (55.8 kg)  11/28/16 130 lb (59 kg)  11/20/16 137 lb 4.8 oz (62.3 kg)   BP Readings from Last 3 Encounters:  01/20/17 102/64  11/28/16 136/84  11/20/16 (!) 111/51     ASSESSMENT AND PLAN:  Discussed the following assessment and plan:  Chest pressure - Plan: DG Chest 2 View  Hx of fall - Plan: DG Chest 2 View  Chronic anticoagulation  At risk for polypharmacy  Varicose veins of both lower extremities  Weight loss - peripherally addressed today .  fu if  persistent progressive for evaluation Curious history acute appointment made for a history of a fall but [employer] nurse advise she be seen to make sure she wasn't a risk of getting pneumonia. Her symptom complex is not consistent with that although she does have some chest wall pain and issues from the fall. We discussed fall prevention and risk of medications. She is apparently on some medicines from Dr. Layne Benton that is not her med list. Not using it much now. Review of record shows and Dr. Kathrin Penner notes that she has had narcotic dependence. We have not been prescribing this and perhaps this is common from various sources including  Ortho.   Advised her to see how she does can get a chest x-ray in any time through the [place] office visit site. To see cardiology with any symptoms of lightheadedness or breathing and bring all  of her bottles with her to be sure that that her medicines are accurate and taking correctly. Warned her that some medicines can cause a person to be foggy. After that evaluation if she still not feeling right to make an appointment for follow-up with [place]. She states that she has weight loss was told to drink boost but does note she's had a nutritional evaluation. -Patient advised to return or notify health care team  if symptoms worsen ,persist or new concerns arise.  Patient Instructions  I don't see any evidence of pneumonia today nor bruising in your chest wall. Continue to take measures for fall prevention. Avoid medicines that can make her head foggy also. If you're having chest symptoms and coughing can get chest x-ray at the [facility] office [place]. I will put in the order if needed.  Go and get it in any time that the radiology department is open Monday through Friday. I'm glad that your injuries are worse. Ask cardiology about her varicose veins that are bothering you were getting ulcers you may want to specific treatment for these.  Fall Prevention in the Home Falls can cause injuries and can affect people from all age groups. There are many simple things that you can do to make your home safe and to help prevent falls. What can I do on the outside of my home?  Regularly repair the edges of walkways and driveways and fix any cracks.  Remove high doorway thresholds.  Trim  any shrubbery on the main path into your home.  Use bright outdoor lighting.  Clear walkways of debris and clutter, including tools and rocks.  Regularly check that handrails are securely fastened and in good repair. Both sides of any steps should have handrails.  Install guardrails along the edges of any raised decks or porches.  Have leaves, snow, and ice cleared regularly.  Use sand or salt on walkways during winter months.  In the garage, clean up any spills right away, including grease or oil  spills. What can I do in the bathroom?  Use night lights.  Install grab bars by the toilet and in the tub and shower. Do not use towel bars as grab bars.  Use non-skid mats or decals on the floor of the tub or shower.  If you need to sit down while you are in the shower, use a plastic, non-slip stool.  Keep the floor dry. Immediately clean up any water that spills on the floor.  Remove soap buildup in the tub or shower on a regular basis.  Attach bath mats securely with double-sided non-slip rug tape.  Remove throw rugs and other tripping hazards from the floor. What can I do in the bedroom?  Use night lights.  Make sure that a bedside light is easy to reach.  Do not use oversized bedding that drapes onto the floor.  Have a firm chair that has side arms to use for getting dressed.  Remove throw rugs and other tripping hazards from the floor. What can I do in the kitchen?  Clean up any spills right away.  Avoid walking on wet floors.  Place frequently used items in easy-to-reach places.  If you need to reach for something above you, use a sturdy step stool that has a grab bar.  Keep electrical cables out of the way.  Do not use floor polish or wax that makes floors slippery. If you have to use wax, make sure that it is non-skid floor wax.  Remove throw rugs and other tripping hazards from the floor. What can I do in the stairways?  Do not leave any items on the stairs.  Make sure that there are handrails on both sides of the stairs. Fix handrails that are broken or loose. Make sure that handrails are as long as the stairways.  Check any carpeting to make sure that it is firmly attached to the stairs. Fix any carpet that is loose or worn.  Avoid having throw rugs at the top or bottom of stairways, or secure the rugs with carpet tape to prevent them from moving.  Make sure that you have a light switch at the top of the stairs and the bottom of the stairs. If you do  not have them, have them installed. What are some other fall prevention tips?  Wear closed-toe shoes that fit well and support your feet. Wear shoes that have rubber soles or low heels.  When you use a stepladder, make sure that it is completely opened and that the sides are firmly locked. Have someone hold the ladder while you are using it. Do not climb a closed stepladder.  Add color or contrast paint or tape to grab bars and handrails in your home. Place contrasting color strips on the first and last steps.  Use mobility aids as needed, such as canes, walkers, scooters, and crutches.  Turn on lights if it is dark. Replace any light bulbs that burn out.  Set up furniture  so that there are clear paths. Keep the furniture in the same spot.  Fix any uneven floor surfaces.  Choose a carpet design that does not hide the edge of steps of a stairway.  Be aware of any and all pets.  Review your medicines with your healthcare provider. Some medicines can cause dizziness or changes in blood pressure, which increase your risk of falling. Talk with your health care provider about other ways that you can decrease your risk of falls. This may include working with a physical therapist or trainer to improve your strength, balance, and endurance. This information is not intended to replace advice given to you by your health care provider. Make sure you discuss any questions you have with your health care provider. Document Released: 10/21/2002 Document Revised: 03/29/2016 Document Reviewed: 12/05/2014 Elsevier Interactive Patient Education  2017 [place] K. Panosh M.D.  Lab Results  Component Value Date   WBC 5.2 11/28/2016   HGB 14.0 11/28/2016   HCT 42.2 11/28/2016   PLT 160.0 11/28/2016   GLUCOSE 91 11/28/2016   CHOL 141 04/14/2014   TRIG 56 04/14/2014   HDL 89 04/14/2014   LDLCALC 41 04/14/2014   ALT 21 11/28/2016   AST 20 11/28/2016   NA 140 11/28/2016   K 4.5  11/28/2016   CL 102 11/28/2016   CREATININE 1.93 (H) 11/28/2016   BUN 28 (H) 11/28/2016   CO2 31 11/28/2016   TSH 1.472 03/31/2016   INR 1.30 11/02/2016

## 2017-01-20 NOTE — Patient Instructions (Signed)
I don't see any evidence of pneumonia today nor bruising in your chest wall. Continue to take measures for fall prevention. Avoid medicines that can make her head foggy also. If you're having chest symptoms and coughing can get chest x-ray at the [facility] office [place]. I will put in the order if needed.  Go and get it in any time that the radiology department is open Monday through Friday. I'm glad that your injuries are worse. Ask cardiology about her varicose veins that are bothering you were getting ulcers you may want to specific treatment for these.  Fall Prevention in the Home Falls can cause injuries and can affect people from all age groups. There are many simple things that you can do to make your home safe and to help prevent falls. What can I do on the outside of my home?  Regularly repair the edges of walkways and driveways and fix any cracks.  Remove high doorway thresholds.  Trim any shrubbery on the main path into your home.  Use bright outdoor lighting.  Clear walkways of debris and clutter, including tools and rocks.  Regularly check that handrails are securely fastened and in good repair. Both sides of any steps should have handrails.  Install guardrails along the edges of any raised decks or porches.  Have leaves, snow, and ice cleared regularly.  Use sand or salt on walkways during winter months.  In the garage, clean up any spills right away, including grease or oil spills. What can I do in the bathroom?  Use night lights.  Install grab bars by the toilet and in the tub and shower. Do not use towel bars as grab bars.  Use non-skid mats or decals on the floor of the tub or shower.  If you need to sit down while you are in the shower, use a plastic, non-slip stool.  Keep the floor dry. Immediately clean up any water that spills on the floor.  Remove soap buildup in the tub or shower on a regular basis.  Attach bath mats securely with double-sided  non-slip rug tape.  Remove throw rugs and other tripping hazards from the floor. What can I do in the bedroom?  Use night lights.  Make sure that a bedside light is easy to reach.  Do not use oversized bedding that drapes onto the floor.  Have a firm chair that has side arms to use for getting dressed.  Remove throw rugs and other tripping hazards from the floor. What can I do in the kitchen?  Clean up any spills right away.  Avoid walking on wet floors.  Place frequently used items in easy-to-reach places.  If you need to reach for something above you, use a sturdy step stool that has a grab bar.  Keep electrical cables out of the way.  Do not use floor polish or wax that makes floors slippery. If you have to use wax, make sure that it is non-skid floor wax.  Remove throw rugs and other tripping hazards from the floor. What can I do in the stairways?  Do not leave any items on the stairs.  Make sure that there are handrails on both sides of the stairs. Fix handrails that are broken or loose. Make sure that handrails are as long as the stairways.  Check any carpeting to make sure that it is firmly attached to the stairs. Fix any carpet that is loose or worn.  Avoid having throw rugs at the top or bottom  of stairways, or secure the rugs with carpet tape to prevent them from moving.  Make sure that you have a light switch at the top of the stairs and the bottom of the stairs. If you do not have them, have them installed. What are some other fall prevention tips?  Wear closed-toe shoes that fit well and support your feet. Wear shoes that have rubber soles or low heels.  When you use a stepladder, make sure that it is completely opened and that the sides are firmly locked. Have someone hold the ladder while you are using it. Do not climb a closed stepladder.  Add color or contrast paint or tape to grab bars and handrails in your home. Place contrasting color strips on the  first and last steps.  Use mobility aids as needed, such as canes, walkers, scooters, and crutches.  Turn on lights if it is dark. Replace any light bulbs that burn out.  Set up furniture so that there are clear paths. Keep the furniture in the same spot.  Fix any uneven floor surfaces.  Choose a carpet design that does not hide the edge of steps of a stairway.  Be aware of any and all pets.  Review your medicines with your healthcare provider. Some medicines can cause dizziness or changes in blood pressure, which increase your risk of falling. Talk with your health care provider about other ways that you can decrease your risk of falls. This may include working with a physical therapist or trainer to improve your strength, balance, and endurance. This information is not intended to replace advice given to you by your health care provider. Make sure you discuss any questions you have with your health care provider. Document Released: 10/21/2002 Document Revised: 03/29/2016 Document Reviewed: 12/05/2014 Elsevier Interactive Patient Education  2017 [employer].

## 2017-01-21 ENCOUNTER — Other Ambulatory Visit: Payer: Self-pay | Admitting: Internal Medicine

## 2017-01-24 ENCOUNTER — Ambulatory Visit (INDEPENDENT_AMBULATORY_CARE_PROVIDER_SITE_OTHER): Payer: Medicare Other | Admitting: *Deleted

## 2017-01-24 DIAGNOSIS — R001 Bradycardia, unspecified: Secondary | ICD-10-CM | POA: Diagnosis not present

## 2017-01-24 NOTE — Progress Notes (Signed)
Remote pacemaker transmission.   

## 2017-01-24 NOTE — Telephone Encounter (Signed)
Pt needs a follow up a

## 2017-01-25 ENCOUNTER — Encounter: Payer: Self-pay | Admitting: Cardiology

## 2017-01-25 LAB — CUP PACEART REMOTE DEVICE CHECK
Battery Remaining Longevity: 108 mo
Battery Voltage: 2.98 V
Brady Statistic AP VS Percent: 1 %
Brady Statistic RA Percent Paced: 17 %
Date Time Interrogation Session: 20180313060015
Implantable Lead Implant Date: 20150610
Implantable Lead Location: 753860
Implantable Lead Model: 5076
Implantable Lead Model: 5076
Implantable Pulse Generator Implant Date: 20150610
Lead Channel Impedance Value: 460 Ohm
Lead Channel Impedance Value: 510 Ohm
Lead Channel Pacing Threshold Amplitude: 0.75 V
Lead Channel Pacing Threshold Pulse Width: 0.4 ms
Lead Channel Pacing Threshold Pulse Width: 0.4 ms
Lead Channel Sensing Intrinsic Amplitude: 12 mV
Lead Channel Setting Pacing Amplitude: 2 V
Lead Channel Setting Pacing Pulse Width: 0.4 ms
MDC IDC LEAD IMPLANT DT: 20150610
MDC IDC LEAD LOCATION: 753859
MDC IDC MSMT BATTERY REMAINING PERCENTAGE: 95.5 %
MDC IDC MSMT LEADCHNL RA SENSING INTR AMPL: 4.2 mV
MDC IDC MSMT LEADCHNL RV PACING THRESHOLD AMPLITUDE: 1.25 V
MDC IDC SET LEADCHNL RV PACING AMPLITUDE: 2.5 V
MDC IDC SET LEADCHNL RV SENSING SENSITIVITY: 2 mV
MDC IDC STAT BRADY AP VP PERCENT: 99 %
MDC IDC STAT BRADY AS VP PERCENT: 1 %
MDC IDC STAT BRADY AS VS PERCENT: 1 %
MDC IDC STAT BRADY RV PERCENT PACED: 94 %
Pulse Gen Model: 2240
Pulse Gen Serial Number: 7627922

## 2017-01-26 ENCOUNTER — Encounter: Payer: Self-pay | Admitting: Family Medicine

## 2017-01-26 ENCOUNTER — Telehealth: Payer: Self-pay | Admitting: Internal Medicine

## 2017-01-26 NOTE — Telephone Encounter (Signed)
error 

## 2017-01-26 NOTE — Telephone Encounter (Signed)
Left a message on home # for the pt to return my call.

## 2017-01-26 NOTE — Telephone Encounter (Signed)
Patient Name: SHELISHA GAUTIER N DOB: Aug 20, 1925 Initial Comment Caller states that she has abdomen pain on the right side and states that it moved to the middle sometimes as well. The pain makes her nauseous and she wants to know if she needs to be seen. Nurse Assessment Nurse: Chesley Noon, RN, Lattie Haw Date/Time [name] Time): 01/26/2017 9:07:00 AM Confirm and document reason for call. If symptomatic, describe symptoms. ---Caller states she has abdominal pain, last BM yesterday and was hard pebbles. She feels like she has gas but cannot pass stool. Pain started yesterday and then quit and then started around 0130 and woke her up. No fever. No vomiting. She has had nausea. Does the patient have any new or worsening symptoms? ---Yes Will a triage be completed? ---Yes Related visit to physician within the last 2 weeks? ---No Does the PT have any chronic conditions? (i.e. diabetes, asthma, etc.) ---No Is this a behavioral health or substance abuse call? ---No Guidelines Guideline Title Affirmed Question Affirmed Notes Abdominal Pain - Female [1] SEVERE pain (e.g., excruciating) AND [2] present > 1 hour Final Disposition User Go to ED Now Chesley Noon, RN, Lattie Haw Comments Pain 10/10 right lower pain Referrals [facility] - ED Disagree/Comply: Comply

## 2017-01-27 ENCOUNTER — Emergency Department (HOSPITAL_COMMUNITY): Payer: Medicare Other

## 2017-01-27 ENCOUNTER — Emergency Department (HOSPITAL_COMMUNITY)
Admission: EM | Admit: 2017-01-27 | Discharge: 2017-01-28 | Disposition: A | Discharge status: Home / Self Care | Payer: Medicare Other | Attending: Emergency Medicine | Admitting: Emergency Medicine

## 2017-01-27 ENCOUNTER — Encounter (HOSPITAL_COMMUNITY): Payer: Self-pay | Admitting: Emergency Medicine

## 2017-01-27 DIAGNOSIS — E039 Hypothyroidism, unspecified: Secondary | ICD-10-CM | POA: Diagnosis not present

## 2017-01-27 DIAGNOSIS — R410 Disorientation, unspecified: Secondary | ICD-10-CM | POA: Diagnosis present

## 2017-01-27 DIAGNOSIS — Z79899 Other long term (current) drug therapy: Secondary | ICD-10-CM | POA: Insufficient documentation

## 2017-01-27 DIAGNOSIS — I130 Hypertensive heart and chronic kidney disease with heart failure and stage 1 through stage 4 chronic kidney disease, or unspecified chronic kidney disease: Secondary | ICD-10-CM | POA: Diagnosis not present

## 2017-01-27 DIAGNOSIS — Z950 Presence of cardiac pacemaker: Secondary | ICD-10-CM | POA: Insufficient documentation

## 2017-01-27 DIAGNOSIS — Z85828 Personal history of other malignant neoplasm of skin: Secondary | ICD-10-CM | POA: Diagnosis not present

## 2017-01-27 DIAGNOSIS — N184 Chronic kidney disease, stage 4 (severe): Secondary | ICD-10-CM | POA: Insufficient documentation

## 2017-01-27 DIAGNOSIS — I5042 Chronic combined systolic (congestive) and diastolic (congestive) heart failure: Secondary | ICD-10-CM | POA: Diagnosis not present

## 2017-01-27 DIAGNOSIS — Z7901 Long term (current) use of anticoagulants: Secondary | ICD-10-CM | POA: Diagnosis not present

## 2017-01-27 LAB — URINALYSIS, ROUTINE W REFLEX MICROSCOPIC
Bilirubin Urine: NEGATIVE
Glucose, UA: NEGATIVE mg/dL
Hgb urine dipstick: NEGATIVE
Ketones, ur: NEGATIVE mg/dL
LEUKOCYTES UA: NEGATIVE
NITRITE: NEGATIVE
PH: 5 (ref 5.0–8.0)
Protein, ur: NEGATIVE mg/dL
SPECIFIC GRAVITY, URINE: 1.006 (ref 1.005–1.030)

## 2017-01-27 LAB — COMPREHENSIVE METABOLIC PANEL
ALT: 21 U/L (ref 14–54)
ANION GAP: 8 (ref 5–15)
AST: 23 U/L (ref 15–41)
Albumin: 3.6 g/dL (ref 3.5–5.0)
Alkaline Phosphatase: 56 U/L (ref 38–126)
BUN: 30 mg/dL — ABNORMAL HIGH (ref 6–20)
CHLORIDE: 108 mmol/L (ref 101–111)
CO2: 25 mmol/L (ref 22–32)
Calcium: 9 mg/dL (ref 8.9–10.3)
Creatinine, Ser: 1.95 mg/dL — ABNORMAL HIGH (ref 0.44–1.00)
GFR calc non Af Amer: 22 mL/min — ABNORMAL LOW (ref >60)
GFR, EST AFRICAN AMERICAN: 25 mL/min — AB (ref >60)
Glucose, Bld: 98 mg/dL (ref 65–99)
Potassium: 3.9 mmol/L (ref 3.5–5.1)
SODIUM: 141 mmol/L (ref 135–145)
Total Bilirubin: 0.9 mg/dL (ref 0.3–1.2)
Total Protein: 7 g/dL (ref 6.5–8.1)

## 2017-01-27 LAB — I-STAT CG4 LACTIC ACID, ED: Lactic Acid, Venous: 0.9 mmol/L (ref 0.5–1.9)

## 2017-01-27 LAB — LACTIC ACID, PLASMA: Lactic Acid, Venous: 1 mmol/L (ref 0.5–1.9)

## 2017-01-27 LAB — CBC WITH DIFFERENTIAL/PLATELET
BASOS ABS: 0 10*3/uL (ref 0.0–0.1)
Basophils Relative: 0 %
EOS ABS: 0 10*3/uL (ref 0.0–0.7)
EOS PCT: 1 %
HCT: 45 % (ref 36.0–46.0)
Hemoglobin: 14.5 g/dL (ref 12.0–15.0)
LYMPHS ABS: 0.8 10*3/uL (ref 0.7–4.0)
LYMPHS PCT: 18 %
MCH: 31.2 pg (ref 26.0–34.0)
MCHC: 32.2 g/dL (ref 30.0–36.0)
MCV: 96.8 fL (ref 78.0–100.0)
MONO ABS: 0.3 10*3/uL (ref 0.1–1.0)
Monocytes Relative: 7 %
Neutro Abs: 3.4 10*3/uL (ref 1.7–7.7)
Neutrophils Relative %: 74 %
PLATELETS: 125 10*3/uL — AB (ref 150–400)
RBC: 4.65 MIL/uL (ref 3.87–5.11)
RDW: 14.8 % (ref 11.5–15.5)
WBC: 4.6 10*3/uL (ref 4.0–10.5)

## 2017-01-27 MED ORDER — SODIUM CHLORIDE 0.9 % IV BOLUS (SEPSIS)
1000.0000 mL | Freq: Once | INTRAVENOUS | Status: AC
  Administered 2017-01-27: 1000 mL via INTRAVENOUS

## 2017-01-27 NOTE — ED Provider Notes (Signed)
[facility] DEPT Provider Note   CSN: [id number] Arrival date & time: 01/27/17  1739     History   Chief Complaint Chief Complaint  Patient presents with  . Altered Mental Status    HPI [name] is a 83 y.o. female.   Altered Mental Status   This is a new problem. The current episode started 1 to 2 hours ago. The problem has been resolved. Associated symptoms include confusion. Risk factors: none. Her past medical history does not include diabetes, liver disease or COPD.   She had an episode at home where she was walking around and she didn't recognize anything. She neither cells but it didn't look like her house that she walked out went to a neighbor's house and knocked on the door. They called EMS. Patient has no complaints this time. She knows where she is and has no history of the same. EMS states they felt like she was neglected however she lives by herself and patient does not feel neglected.  Past Medical History:  Diagnosis Date  . Anemia   . Atrial fibrillation ([place])    a. Dx 01/2014->Eliquis started.  . Cancer ([place])   . Chest pain    a. 2011 Neg MV;  b. 01/2014 Cath: LM 20-30, LAD nl, D1 nl, LCX nl, OM1 nl, RCA dom 30-58m, PD/PL nl, EF 65%->Med Rx; c. 03/2016 MV: no ischemia/infarct. EF 63%.  . Degenerative joint disease   . Diastolic CHF, acute on chronic ([place]) 01/31/2012  . Dyslipidemia   . Dysrhythmia   . Family history of adverse reaction to anesthesia    " multiple family members have difficulty waking "  . GERD (gastroesophageal reflux disease)   . History of skin cancer    tafeen/ non melanoma.   Marland Kitchen Hx of varicella   . Hypertension   . Hypothyroidism   . Monoclonal gammopathies   . Pacemaker   . Palpitations    PVCs/bigeminy on event in May 2009 revealing this was relatively asymptomatic  . Right lower quadrant abdominal pain 07/19/2012   Recurrent  With nausea    This is new since she had ct for llq pain in [facility]    R/o appendiceal problem   Hernia  Get ct scan  And plan  Fu     . Thrombocytopenia [facility])     Patient Active Problem List   Diagnosis Date Noted  . Dehydration 11/18/2016  . Intractable nausea and vomiting 11/18/2016  . Generalized weakness 11/18/2016  . AKI (acute kidney injury) ([place]) 11/11/2016  . Hypokalemia 11/11/2016  . Hypertensive heart disease 04/01/2016  . PAF (paroxysmal atrial fibrillation) ([place]) 03/31/2016  . Chest pain with moderate risk for cardiac etiology, negative MI and Nuc study, + muscular skeletal   . Atypical chest pain 01/21/2016  . CKD (chronic kidney disease) stage 4, GFR 15-29 ml/min (HCC) 05/09/2015  . Anemia of chronic disease 05/09/2015  . Dyslipidemia 05/09/2015  . Iron deficiency anemia 11/21/2014  . Pacemaker 04/27/2014  . Chronic anticoagulation 02/25/2014  . Chronic combined systolic and diastolic CHF (congestive heart failure) ([place]) 02/24/2014  . Atrial fibrillation ([place]) 01/29/2014  . MGUS (monoclonal gammopathy of unknown significance) 08/01/2013  . Hypothyroidism 02/19/2012  . Hyperlipidemia 12/15/2008  . HYPERTENSION, BENIGN 12/15/2008    Past Surgical History:  Procedure Laterality Date  . CARDIOVERSION N/A 02/26/2014   Procedure: CARDIOVERSION AT BEDSIDE;  Surgeon: Pixie Casino, MD;  Location: [facility];  Service: Cardiovascular;  Laterality: N/A;  . CARDIOVERSION N/A  04/22/2014   Procedure: CARDIOVERSION (BEDSIDE);  Surgeon: [name], MD;  Location: [facility] OR;  Service: Cardiovascular;  Laterality: N/A;  . CATARACT EXTRACTION     Bilateral  implantt  . CESAREAN SECTION     times 2  . CHOLECYSTECTOMY  1999  . ESOPHAGOGASTRODUODENOSCOPY N/A 07/06/2014   Procedure: ESOPHAGOGASTRODUODENOSCOPY (EGD);  Surgeon: Ladene Artist, MD;  Location: [facility] ENDOSCOPY;  Service: Endoscopy;  Laterality: N/A;  . INSERT / REPLACE / REMOVE PACEMAKER    . LEFT HEART CATHETERIZATION WITH CORONARY ANGIOGRAM N/A 01/29/2014   Procedure: LEFT HEART CATHETERIZATION WITH CORONARY ANGIOGRAM;   Surgeon: Blane Ohara, MD;  Location: [facility] CATH LAB;  Service: Cardiovascular;  Laterality: N/A;  . PACEMAKER INSERTION  04/23/2014   STJ Assurity dual chamber pacemaker implanted by Dr Rayann Heman  . PERMANENT PACEMAKER INSERTION N/A 04/23/2014   Procedure: PERMANENT PACEMAKER INSERTION;  Surgeon: Evans Lance, MD;  Location: [facility] CATH LAB;  Service: Cardiovascular;  Laterality: N/A;  . RIGHT HEART CATHETERIZATION N/A 09/22/2014   Procedure: RIGHT HEART CATH;  Surgeon: Jolaine Artist, MD;  Location: [facility] CATH LAB;  Service: Cardiovascular;  Laterality: N/A;  . SHOULDER SURGERY  1996   Right     OB History    No data available       Home Medications    Prior to Admission medications   Medication Sig Start Date End Date Taking? Authorizing Provider  amiodarone (PACERONE) 200 MG tablet Take 1 tablet (200 mg total) by mouth daily. 11/25/16  Yes Jolaine Artist, MD  baclofen (LIORESAL) 10 MG tablet Take 10 mg by mouth 3 (three) times daily.   Yes Historical Provider, MD  ELIQUIS 2.5 MG TABS tablet TAKE ONE TABLET BY MOUTH TWICE DAILY 10/18/16  Yes Jolaine Artist, MD  Ferrous Sulfate (IRON) 325 (65 Fe) MG TABS Take 1 tablet by mouth daily. 07/01/16  Yes Clanford Marisa Hua, MD  HYDROcodone-acetaminophen (NORCO/VICODIN) 5-325 MG tablet  11/20/16  Yes Historical Provider, MD  levothyroxine (SYNTHROID, LEVOTHROID) 75 MCG tablet TAKE ONE TABLET BY MOUTH ONCE DAILY. 01/24/17  Yes Burnis Medin, MD  meclizine (ANTIVERT) 12.5 MG tablet Take 1 tablet (12.5 mg total) by mouth 2 (two) times daily as needed for dizziness. 09/14/16  Yes Jolaine Artist, MD  metolazone (ZAROXOLYN) 2.5 MG tablet Take 1 tablet (2.5 mg total) by mouth as needed (for weight 134 lb or greater). 03/09/16  Yes Shaune Pascal Bensimhon, MD  pantoprazole (PROTONIX) 40 MG tablet Take 40 mg by mouth daily.   Yes Historical Provider, MD  potassium chloride SA (K-DUR,KLOR-CON) 20 MEQ tablet Take 2 tablets (40 mEq total) by mouth daily. Take  an additional 68meq with Metolazone. 10/04/16  Yes Jolaine Artist, MD  senna (SENOKOT) 8.6 MG TABS tablet Take 2 tablets (17.2 mg total) by mouth at bedtime. 11/13/16  Yes Doreatha Lew, MD  simvastatin (ZOCOR) 10 MG tablet TAKE ONE TABLET BY MOUTH ONCE DAILY AT BEDTIME 09/30/16  Yes Jolaine Artist, MD  torsemide (DEMADEX) 20 MG tablet Take 2 tablets (40 mg total) by mouth daily. Please start on 11/22/2016 11/22/16  Yes Albertine Patricia, MD  denosumab (PROLIA) 60 MG/ML SOLN injection Inject 60 mg into the skin every 6 (six) months. Administer in upper arm, thigh, or abdomen    Historical Provider, MD  nitroGLYCERIN (NITROSTAT) 0.4 MG SL tablet Place 1 tablet (0.4 mg total) under the tongue every 5 (five) minutes as needed. For chest pain 05/06/16  Jolaine Artist, MD    Family History Family History  Problem Relation Age of Onset  . Coronary artery disease Father   . Heart disease Father   . Heart attack Father   . Alzheimer's disease Mother   . Lung cancer Brother 49  . Lung cancer      Social History Social History  Substance Use Topics  . Smoking status: Never Smoker  . Smokeless tobacco: Never Used  . Alcohol use No     Allergies   Other; Tramadol; and Ibuprofen   Review of Systems Review of Systems  Constitutional: Negative for fever.  Gastrointestinal: Negative for abdominal pain.  Psychiatric/Behavioral: Positive for confusion.  All other systems reviewed and are negative.    Physical Exam Updated Vital Signs BP 136/69 (BP Location: Right Arm)   Pulse 74   Temp 98.1 F (36.7 C) (Oral)   Resp 19   Ht 4\' 10"  (1.473 m)   Wt 119 lb (54 kg)   SpO2 99%   BMI 24.87 kg/m   Physical Exam  Constitutional: She is oriented to person, place, and time. She appears well-developed and well-nourished.  HENT:  Head: Normocephalic and atraumatic.  Eyes: Conjunctivae and EOM are normal.  Neck: Normal range of motion.  Cardiovascular: Normal rate and  regular rhythm.   Pulmonary/Chest: Effort normal and breath sounds normal. No stridor. No respiratory distress. She has no wheezes.  Abdominal: Soft. She exhibits no distension.  Musculoskeletal: Normal range of motion. She exhibits no edema or deformity.  Neurological: She is alert and oriented to person, place, and time. No cranial nerve deficit. Coordination normal.  No altered mental status, able to give full seemingly accurate history.  Face is symmetric, EOM's intact, pupils equal and reactive, vision intact, tongue and uvula midline without deviation Upper and Lower extremity motor 5/5, intact pain perception in distal extremities, 2+ reflexes in biceps, patella and achilles tendons.   Skin: Skin is warm and dry. No pallor.  Nursing note and vitals reviewed.    ED Treatments / Results  Labs (all labs ordered are listed, but only abnormal results are displayed) Labs Reviewed  CBC WITH DIFFERENTIAL/PLATELET - Abnormal; Notable for the following:       Result Value   Platelets 125 (*)    All other components within normal limits  COMPREHENSIVE METABOLIC PANEL - Abnormal; Notable for the following:    BUN 30 (*)    Creatinine, Ser 1.95 (*)    GFR calc non Af Amer 22 (*)    GFR calc Af Amer 25 (*)    All other components within normal limits  URINALYSIS, ROUTINE W REFLEX MICROSCOPIC - Abnormal; Notable for the following:    Color, Urine STRAW (*)    All other components within normal limits  LACTIC ACID, PLASMA  LACTIC ACID, PLASMA  I-STAT CG4 LACTIC ACID, ED    EKG  EKG Interpretation  Date/Time:  Friday January 27 2017 17:52:17 EDT Ventricular Rate:  70 PR Interval:    QRS Duration: 173 QT Interval:  486 QTC Calculation: 525 R Axis:   100 Text Interpretation:  Paced rhythm Probable left atrial enlargement Consider left ventricular hypertrophy Prolonged QT interval Baseline wander in lead(s) I III aVL No significant change since last tracing Confirmed by [facility] MD, Corene Cornea  [phone]) on 01/27/2017 6:29:57 PM       Radiology Dg Chest 2 View  Result Date: 01/27/2017 CLINICAL DATA:  History of cancers CHF hypertension EXAM: CHEST  2  VIEW COMPARISON:  11/18/2016, 11/11/2016 FINDINGS: Right-sided pacing device with leads over the right atrium and right ventricle. Surgical clips in the right upper quadrant. Elevated left diaphragm. Mild atelectasis or infiltrate at the left base. Stable cardiomegaly with mild central congestion. Atherosclerosis. No pneumothorax. Postsurgical changes of the right humeral head. IMPRESSION: 1. Stable elevation of left diaphragm. Mild atelectasis or infiltrate at the left lung base. 2. Stable cardiomegaly with mild central congestion Electronically Signed   By: Donavan Foil M.D.   On: 01/27/2017 18:29   Ct Head Wo Contrast  Result Date: 01/27/2017 CLINICAL DATA:  Initial evaluation for generalized weakness. Evaluate for changes. The he EXAM: CT HEAD WITHOUT CONTRAST TECHNIQUE: Contiguous axial images were obtained from the base of the skull through the vertex without intravenous contrast. COMPARISON:  Prior CT from 08/19/2016. FINDINGS: Brain: Stable atrophy with chronic microvascular disease. No acute intracranial hemorrhage. No evidence for acute infarct. No mass lesion, midline shift or mass effect. No hydrocephalus. No extra-axial fluid collection. Vascular: No hyperdense vessel. Scattered vascular calcifications noted within the carotid siphons. Skull: Scalp soft tissues within normal limits.  Calvarium intact. Sinuses/Orbits: Globes and oval soft tissues within normal limits. Paranasal sinuses are clear. No mastoid effusion. Other: None. IMPRESSION: 1. No acute intracranial process. 2. Stable atrophy with moderate chronic microvascular ischemic disease. Electronically Signed   By: Jeannine Boga M.D.   On: 01/27/2017 19:32    Procedures Procedures (including critical care time)  Medications Ordered in ED Medications  sodium chloride  0.9 % bolus 1,000 mL (0 mLs Intravenous Stopped 01/27/17 2141)     Initial Impression / Assessment and Plan / ED Course  I have reviewed the triage vital signs and the nursing notes.  Pertinent labs & imaging results that were available during my care of the patient were reviewed by me and considered in my medical decision making (see chart for details).       Labs and imaging unremarkable. Unsure of what caused the patient to feel like she was in her home, may be med effect.  After discussion with both daughters is seems that this is not a new problem. There is no reason for admission to the hospital. Patient really wanted to be observed overnight however I discussed with her there is no indication.  I discussed with both daughters about getting help at home and possibly transition into the retirement community versus assisted living. They agreed and we'll work on that this weekend. Care management consulted and face-to-face orders as well for possible medication side effects, confusion and medication effects causing her symptoms.  Final Clinical Impressions(s) / ED Diagnoses   Final diagnoses:  Confusion     Merrily Pew, MD 01/27/17 2340

## 2017-01-27 NOTE — Telephone Encounter (Signed)
Spoke with pt's neighbor, as pt was being evaluated by EMS, and she states that pt has not been doing well. (Did not ask for any information from neighbor - all of the following was relayed to me.)  Neighbor states pt has evidently not been taking her medications correctly, if at all. She reports pt was very confused last night, right hand very shaky, almost unable to feed herself. Neighbor reports today pt was very "flushed" and disoriented. She reports pt went to neighbor's house about 8am and seemed to not know where she was. They called EMS and she was not transported at that time. She reports pt did not improve throughout the day, so they called EMS again and pt is now being taken to [facility] ED for evaluation and treatment.   Neighbor is VERY concerned that pt is not being taken care of by daughter. She reports that she has overheard conversations between pt and daughter that "are not very nice". She feels pt's daughter has not been dispensing pt's medication correctly and therefore pt has not been taking her medications as she should be. Neighbor asked for me to include this information in the message.   Dr. Regis Bill - FYI. Thanks!

## 2017-01-27 NOTE — ED Triage Notes (Signed)
Per EMS patient from home lives alone and has care from daughter and neighbors. Neighbors state that they are mainly caring for her and have called social services in the past without assistance. Patient appeared to neighbors house disoriented. Patient c/o generalized weakness and dysuria.

## 2017-01-27 NOTE — ED Notes (Signed)
Pt up to bedside commode with assistance.  Pt accidentally pulled IV out

## 2017-01-27 NOTE — ED Notes (Signed)
Patient transported to CT 

## 2017-01-27 NOTE — ED Notes (Signed)
Patient transported to X-ray 

## 2017-01-30 ENCOUNTER — Other Ambulatory Visit: Payer: Self-pay | Admitting: Internal Medicine

## 2017-01-30 ENCOUNTER — Telehealth: Payer: Self-pay | Admitting: *Deleted

## 2017-01-30 NOTE — Telephone Encounter (Signed)
EDCM called daughter to set up home health services for her mom.  No answer, left vm to return call asap.

## 2017-02-09 ENCOUNTER — Encounter: Payer: Self-pay | Admitting: Cardiology

## 2017-02-11 ENCOUNTER — Other Ambulatory Visit (HOSPITAL_COMMUNITY): Payer: Self-pay | Admitting: Internal Medicine

## 2017-02-27 ENCOUNTER — Encounter: Payer: Self-pay | Admitting: Internal Medicine

## 2017-02-27 ENCOUNTER — Ambulatory Visit (INDEPENDENT_AMBULATORY_CARE_PROVIDER_SITE_OTHER): Payer: Medicare Other | Admitting: Internal Medicine

## 2017-02-27 VITALS — BP 110/80 | HR 60 | Temp 97.5°F | Ht <= 58 in | Wt 119.0 lb

## 2017-02-27 DIAGNOSIS — Z87898 Personal history of other specified conditions: Secondary | ICD-10-CM

## 2017-02-27 DIAGNOSIS — N184 Chronic kidney disease, stage 4 (severe): Secondary | ICD-10-CM | POA: Diagnosis not present

## 2017-02-27 DIAGNOSIS — R42 Dizziness and giddiness: Secondary | ICD-10-CM

## 2017-02-27 DIAGNOSIS — R300 Dysuria: Secondary | ICD-10-CM

## 2017-02-27 DIAGNOSIS — D472 Monoclonal gammopathy: Secondary | ICD-10-CM | POA: Diagnosis not present

## 2017-02-27 DIAGNOSIS — D696 Thrombocytopenia, unspecified: Secondary | ICD-10-CM

## 2017-02-27 DIAGNOSIS — Z7901 Long term (current) use of anticoagulants: Secondary | ICD-10-CM

## 2017-02-27 DIAGNOSIS — I5042 Chronic combined systolic (congestive) and diastolic (congestive) heart failure: Secondary | ICD-10-CM | POA: Diagnosis not present

## 2017-02-27 DIAGNOSIS — Z8659 Personal history of other mental and behavioral disorders: Secondary | ICD-10-CM | POA: Diagnosis not present

## 2017-02-27 DIAGNOSIS — Z9189 Other specified personal risk factors, not elsewhere classified: Secondary | ICD-10-CM | POA: Diagnosis not present

## 2017-02-27 DIAGNOSIS — R630 Anorexia: Secondary | ICD-10-CM

## 2017-02-27 DIAGNOSIS — R634 Abnormal weight loss: Secondary | ICD-10-CM

## 2017-02-27 LAB — CBC WITH DIFFERENTIAL/PLATELET
BASOS PCT: 1.2 % (ref 0.0–3.0)
Basophils Absolute: 0.1 10*3/uL (ref 0.0–0.1)
EOS PCT: 0.6 % (ref 0.0–5.0)
Eosinophils Absolute: 0 10*3/uL (ref 0.0–0.7)
HEMATOCRIT: 47.4 % — AB (ref 36.0–46.0)
HEMOGLOBIN: 15.7 g/dL — AB (ref 12.0–15.0)
LYMPHS PCT: 11.6 % — AB (ref 12.0–46.0)
Lymphs Abs: 0.7 10*3/uL (ref 0.7–4.0)
MCHC: 33.1 g/dL (ref 30.0–36.0)
MCV: 93.7 fl (ref 78.0–100.0)
Monocytes Absolute: 0.5 10*3/uL (ref 0.1–1.0)
Monocytes Relative: 9.2 % (ref 3.0–12.0)
Neutro Abs: 4.6 10*3/uL (ref 1.4–7.7)
Neutrophils Relative %: 77.4 % — ABNORMAL HIGH (ref 43.0–77.0)
Platelets: 151 10*3/uL (ref 150.0–400.0)
RBC: 5.06 Mil/uL (ref 3.87–5.11)
RDW: 14.8 % (ref 11.5–15.5)
WBC: 5.9 10*3/uL (ref 4.0–10.5)

## 2017-02-27 LAB — POC URINALSYSI DIPSTICK (AUTOMATED)
BILIRUBIN UA: NEGATIVE
Blood, UA: NEGATIVE
GLUCOSE UA: NEGATIVE
Ketones, UA: NEGATIVE
LEUKOCYTES UA: NEGATIVE
NITRITE UA: NEGATIVE
PH UA: 6 (ref 5.0–8.0)
Protein, UA: NEGATIVE
Spec Grav, UA: 1.02 (ref 1.010–1.025)
Urobilinogen, UA: 0.2 E.U./dL

## 2017-02-27 LAB — T4, FREE: Free T4: 1.47 ng/dL (ref 0.60–1.60)

## 2017-02-27 LAB — BASIC METABOLIC PANEL
BUN: 58 mg/dL — ABNORMAL HIGH (ref 6–23)
CHLORIDE: 94 meq/L — AB (ref 96–112)
CO2: 36 mEq/L — ABNORMAL HIGH (ref 19–32)
Calcium: 10.5 mg/dL (ref 8.4–10.5)
Creatinine, Ser: 2.95 mg/dL — ABNORMAL HIGH (ref 0.40–1.20)
GFR: 15.89 mL/min — ABNORMAL LOW (ref >60.00)
Glucose, Bld: 99 mg/dL (ref 70–99)
POTASSIUM: 3.7 meq/L (ref 3.5–5.1)
Sodium: 140 mEq/L (ref 135–145)

## 2017-02-27 LAB — TSH: TSH: 3.28 u[IU]/mL (ref 0.35–4.50)

## 2017-02-27 NOTE — Patient Instructions (Addendum)
I think delineating this problem would be safer and better if you did not live alone. That way monitoring would be more accurate and effective. Nevertheless, and reversible problems that could cause dizziness confusion can be related to medications and/or nutrition.  Medications can cause side effects even if did not cause a problem previously. Sometimes accidentally taking too much or too little the medication can cause this symptom also. Advise  Careful monitoring  And   Double check on as needed medication   On your current med list baclofen which is a muscle spasm medicine, hydrocodone, and Antivert meclizine, could all cause drowsiness or mental fogginess. I suggest you not take these in weaning her hydrocodone as per the prescriber. If the muscle spasm medicine as needed double check on use by another person such as a family member to make sure you are not taking too much for you.  We are checking today for kidney function anemia thyroid and urinary tract infection. If you are eligible would like you to have a visit regarding your medicines. If one cannot come to the home would have you come back with all the bottles in your household.  Plan rov in  1-2 months depending on   Labs and how you are doing .

## 2017-02-27 NOTE — Progress Notes (Signed)
 Chief Complaint  Patient presents with  . Weight Loss    loss of appetite  . Dizziness    started three weeks ago     HPI: [name] 83 y.o. come in for  Above concern. Comes in with her daughter today. She presents her weight going down from about 126 range over the last month or 2. She had an episode of confusion at home thought she was seeing things or hearing things called 911 and was taken to the emergency room. See ED notes. She is worried about her kidney function. No new medicines Dr. Bassett however is been giving her hydrocodone which she has been told to wean because she was taking in about 3 times a day she takes baclofen as needed for muscle spasm. She's also been given Antivert at some point and takes that occasionally for "dizziness vertigo". Although she did have physical therapy visit her how to not use medicines but to turn slowly. She feels lightheaded times just not hungry but no vomiting or real abdominal pain Some occasional burning externally rectal and vaginal no discharge. No fever. She lives alone once stable at way although her family has brought up other alternatives but she wants to "keep her pet dog". She relies a lot on her neighbors and then her family to get her around. Someone checks on her daily. She does denies food insecurity. There is no vomiting. No new medicines from the cardiology CHF clinic.     ROS: See pertinent positives and negatives per HPI. No fever or chills some history of falling. No change in her hearing or vision per se Her medicines are put in a pill container but her as needed medicines or not. she notes that a dentist wanted to take 5 but didn't think that she could handle that one time sometimes tends to chew on one side of her mouth.   Past Medical History:  Diagnosis Date  . Anemia   . Atrial fibrillation (HCC)    a. Dx 01/2014->Eliquis started.  . Cancer (HCC)   . Chest pain    a. 2011 Neg MV;  b. 01/2014  Cath: LM 20-30, LAD nl, D1 nl, LCX nl, OM1 nl, RCA dom 30-40m, PD/PL nl, EF 65%->Med Rx; c. 03/2016 MV: no ischemia/infarct. EF 63%.  . Degenerative joint disease   . Diastolic CHF, acute on chronic (HCC) 01/31/2012  . Dyslipidemia   . Dysrhythmia   . Family history of adverse reaction to anesthesia    " multiple family members have difficulty waking "  . GERD (gastroesophageal reflux disease)   . History of skin cancer    tafeen/ non melanoma.   . Hx of varicella   . Hypertension   . Hypothyroidism   . Monoclonal gammopathies   . Pacemaker   . Palpitations    PVCs/bigeminy on event in May 2009 revealing this was relatively asymptomatic  . Right lower quadrant abdominal pain 07/19/2012   Recurrent  With nausea    This is new since she had ct for llq pain in [facility]    R/o appendiceal problem  Hernia  Get ct scan  And plan  Fu     . Thrombocytopenia (HCC)     Family History  Problem Relation Age of Onset  . Coronary artery disease Father   . Heart disease Father   . Heart attack Father   . Alzheimer's disease Mother   . Lung cancer Brother 64  . Lung cancer        Social History   Social History  . Marital status: Widowed    Spouse name: N/A  . Number of children: 3  . Years of education: N/A   Occupational History  .  Retired    [employer], book keeping   Social History Main Topics  . Smoking status: Never Smoker  . Smokeless tobacco: Never Used  . Alcohol use No  . Drug use: No  . Sexual activity: No   Other Topics Concern  . 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 Eats with her.      Daughterrenee handles medications. Sees HH and PT once a week.   Helper  Lorre Nick t the Friday 1-3 pm    Outpatient Medications Prior to Visit  Medication  Sig Dispense Refill  . amiodarone (PACERONE) 200 MG tablet Take 1 tablet (200 mg total) by mouth daily. 90 tablet 3  . baclofen (LIORESAL) 10 MG tablet Take 10 mg by mouth 3 (three) times daily.    Marland Kitchen denosumab (PROLIA) 60 MG/ML SOLN injection Inject 60 mg into the skin every 6 (six) months. Administer in upper arm, thigh, or abdomen    . ELIQUIS 2.5 MG TABS tablet TAKE ONE TABLET BY MOUTH TWICE DAILY 60 tablet 6  . Ferrous Sulfate (IRON) 325 (65 Fe) MG TABS Take 1 tablet by mouth daily. 180 each 0  . HYDROcodone-acetaminophen (NORCO/VICODIN) 5-325 MG tablet   0  . KLOR-CON M20 20 MEQ tablet TAKE THREE TABLETS BY MOUTH ONCE DAILY. TAKE AN ADDITIONAL 20 MEQ WITH METOLAZONE 90 tablet 2  . levothyroxine (SYNTHROID, LEVOTHROID) 75 MCG tablet TAKE ONE TABLET BY MOUTH ONCE DAILY. 90 tablet 0  . meclizine (ANTIVERT) 12.5 MG tablet Take 1 tablet (12.5 mg total) by mouth 2 (two) times daily as needed for dizziness. 10 tablet 0  . metolazone (ZAROXOLYN) 2.5 MG tablet Take 1 tablet (2.5 mg total) by mouth as needed (for weight 134 lb or greater). 10 tablet 3  . nitroGLYCERIN (NITROSTAT) 0.4 MG SL tablet Place 1 tablet (0.4 mg total) under the tongue every 5 (five) minutes as needed. For chest pain 25 tablet 6  . pantoprazole (PROTONIX) 40 MG tablet Take 40 mg by mouth daily.    . potassium chloride SA (K-DUR,KLOR-CON) 20 MEQ tablet Take 2 tablets (40 mEq total) by mouth daily. Take an additional 30mq with Metolazone. 90 tablet 2  . senna (SENOKOT) 8.6 MG TABS tablet Take 2 tablets (17.2 mg total) by mouth at bedtime. 60 each 0  . simvastatin (ZOCOR) 10 MG tablet TAKE ONE TABLET BY MOUTH AT BEDTIME 30 tablet 3  . torsemide (DEMADEX) 20 MG tablet Take 2 tablets (40 mg total) by mouth daily. Please start on 11/22/2016 60 tablet 6   No facility-administered medications prior to visit.      EXAM:  BP 110/80 (BP Location: Right Arm, Patient Position: Sitting, Cuff Size: Normal)   Pulse 60   Temp 97.5 F (36.4  C) (Oral)   Ht 4' 10" (1.473 m)   Wt 119 lb (54 kg)   BMI 24.87 kg/m   Body mass index is 24.87 kg/m.  GENERAL: vitals reviewed and listed  above, alert, oriented, appears well hydrated and in no acute distressAble to get up on the table with minimal assistance. Has obvious kyphosis walks with a cane reasonable balance HEENT: atraumatic, conjunctiva  clear, no obvious abnormalities on inspection of external nose and ears OP : no lesion edema or exudate some of her teeth are flattened missing upper of note NECK: no obvious masses on inspection palpation  LUNGS: clear to auscultation bilaterally, no wheezes, rales or rhonchi,  CV: HRRR, no clubbing cyanosis or  peripheral edema nl cap refill  No ulcers  Hands are pink  Ruddy distally  MS: moves all extremities without noticeable focal  abnormality PSYCH: pleasant and cooperative, no obvious depression   abd soft without obvious organomegaly no tenderness over the bladder area.  BP Readings from Last 3 Encounters:  02/27/17 110/80  01/27/17 136/69  01/20/17 102/64   Wt Readings from Last 3 Encounters:  02/27/17 119 lb (54 kg)  01/27/17 119 lb (54 kg)  01/20/17 123 lb (55.8 kg)    ASSESSMENT AND PLAN:  Discussed the following assessment and plan:  Weight loss - Plan: POCT Urinalysis, Dipstick, Basic metabolic panel, CBC with Differential/Platelet, TSH, T4, free, Multiple Myeloma Panel (SPEP&IFE w/QIG), CANCELED: CBC with Differential/Platelet  Thrombocytopenia ([place]) - Plan: POCT Urinalysis, Dipstick, Basic metabolic panel, CBC with Differential/Platelet, TSH, T4, free, Multiple Myeloma Panel (SPEP&IFE w/QIG), CANCELED: CBC with Differential/Platelet  Chronic anticoagulation  At risk for polypharmacy  Decreased appetite - Plan: POCT Urinalysis, Dipstick, Basic metabolic panel, CBC with Differential/Platelet, TSH, T4, free, Multiple Myeloma Panel (SPEP&IFE w/QIG), CANCELED: CBC with Differential/Platelet  MGUS (monoclonal  gammopathy of unknown significance) - Plan: POCT Urinalysis, Dipstick, Basic metabolic panel, CBC with Differential/Platelet, TSH, T4, free, Multiple Myeloma Panel (SPEP&IFE w/QIG), CANCELED: CBC with Differential/Platelet  CKD (chronic kidney disease) stage 4, GFR 15-29 ml/min (HCC) - Plan: POCT Urinalysis, Dipstick, Basic metabolic panel, CBC with Differential/Platelet, TSH, T4, free, Multiple Myeloma Panel (SPEP&IFE w/QIG), CANCELED: CBC with Differential/Platelet  Chronic combined systolic and diastolic CHF (congestive heart failure) ([place]) - Plan: POCT Urinalysis, Dipstick, Basic metabolic panel, CBC with Differential/Platelet, TSH, T4, free, Multiple Myeloma Panel (SPEP&IFE w/QIG), CANCELED: CBC with Differential/Platelet  Dysuria - r/o uti  - Plan: POCT Urinalysis, Dipstick, Basic metabolic panel, CBC with Differential/Platelet, TSH, T4, free, Multiple Myeloma Panel (SPEP&IFE w/QIG), POCT Urinalysis Dipstick (Automated), Urine culture, Urine culture, CANCELED: CBC with Differential/Platelet  Dizziness - Plan: POCT Urinalysis, Dipstick, Basic metabolic panel, CBC with Differential/Platelet, TSH, T4, free, Multiple Myeloma Panel (SPEP&IFE w/QIG), CANCELED: CBC with Differential/Platelet  History of confusion Many complaints but more specifically weight loss and "dizziness". Anorexia. Denies depression. I'm not prescribing most of her medications she is going through a hydrocodone withdrawal to decrease in pain. We discussed a good bit about medication monitoring to avoid excess dosings etc. which can cause side effects. Reviewed the 3 medicines on her list that could cause mental fogginess and confusion. We'll plan checking renal function today as well as her thyroid as she is on amiodarone. Reviewed the fact that she would probably be safer if not living alone daughter and her underwear. We'll look into medication home consult and have her collect all the bottles that she has at home and get rid  of expired avoid interactions etc. Plan follow-up in 1-2 months to see her we are doing. We'll review her record for further information. Definitely doesn't take the Antivert reviewed other medications risk-benefit. Discuss further with Dr. Layne Benton. -Patient advised to return or notify health care  team  if  new concerns arise. Total visit 40 mins > 50% spent counseling and coordinating care as indicated in above note and in instructions to patient .     Patient Instructions  I think delineating this problem would be safer and better if you did not live alone. That way monitoring would be more accurate and effective. Nevertheless, and reversible problems that could cause dizziness confusion can be related to medications and/or nutrition.  Medications can cause side effects even if did not cause a problem previously. Sometimes accidentally taking too much or too little the medication can cause this symptom also. Advise  Careful monitoring  And   Double check on as needed medication   On your current med list baclofen which is a muscle spasm medicine, hydrocodone, and Antivert meclizine, could all cause drowsiness or mental fogginess. I suggest you not take these in weaning her hydrocodone as per the prescriber. If the muscle spasm medicine as needed double check on use by another person such as a family member to make sure you are not taking too much for you.  We are checking today for kidney function anemia thyroid and urinary tract infection. If you are eligible would like you to have a visit regarding your medicines. If one cannot come to the home would have you come back with all the bottles in your household.  Plan rov in  1-2 months depending on   Labs and how you are doing .     Standley Brooking. Eulene Pekar M.D.  Lab Results  Component Value Date   WBC 5.9 02/27/2017   HGB 15.7 (H) 02/27/2017   HCT 47.4 (H) 02/27/2017   PLT 151.0 02/27/2017   GLUCOSE 99 02/27/2017   CHOL 141 04/14/2014    TRIG 56 04/14/2014   HDL 89 04/14/2014   LDLCALC 41 04/14/2014   ALT 21 01/27/2017   AST 23 01/27/2017   NA 140 02/27/2017   K 3.7 02/27/2017   CL 94 (L) 02/27/2017   CREATININE 2.95 (H) 02/27/2017   BUN 58 (H) 02/27/2017   CO2 36 (H) 02/27/2017   TSH 3.28 02/27/2017   INR 1.30 11/02/2016     Review of controlled substance [facility] registry has had extremely hydrocodone +30 hydrocodone in January and February prescribed In November December the oxycodone and 60 hydrocodone.

## 2017-02-28 LAB — URINE CULTURE: Organism ID, Bacteria: NO GROWTH

## 2017-03-02 ENCOUNTER — Other Ambulatory Visit: Payer: Self-pay | Admitting: Emergency Medicine

## 2017-03-02 DIAGNOSIS — N289 Disorder of kidney and ureter, unspecified: Secondary | ICD-10-CM

## 2017-03-06 ENCOUNTER — Other Ambulatory Visit: Payer: Medicare Other

## 2017-03-07 ENCOUNTER — Other Ambulatory Visit (INDEPENDENT_AMBULATORY_CARE_PROVIDER_SITE_OTHER): Payer: Medicare Other

## 2017-03-07 DIAGNOSIS — D696 Thrombocytopenia, unspecified: Secondary | ICD-10-CM

## 2017-03-07 DIAGNOSIS — Z7901 Long term (current) use of anticoagulants: Secondary | ICD-10-CM | POA: Diagnosis not present

## 2017-03-07 DIAGNOSIS — N183 Chronic kidney disease, stage 3 unspecified: Secondary | ICD-10-CM

## 2017-03-07 LAB — CBC WITH DIFFERENTIAL/PLATELET
BASOS ABS: 0.1 10*3/uL (ref 0.0–0.1)
BASOS PCT: 1 % (ref 0.0–3.0)
EOS ABS: 0 10*3/uL (ref 0.0–0.7)
Eosinophils Relative: 0.8 % (ref 0.0–5.0)
HCT: 43.6 % (ref 36.0–46.0)
HEMOGLOBIN: 14.4 g/dL (ref 12.0–15.0)
Lymphocytes Relative: 12.9 % (ref 12.0–46.0)
Lymphs Abs: 0.7 10*3/uL (ref 0.7–4.0)
MCHC: 33.2 g/dL (ref 30.0–36.0)
MCV: 94.7 fl (ref 78.0–100.0)
Monocytes Absolute: 0.5 10*3/uL (ref 0.1–1.0)
Monocytes Relative: 8.3 % (ref 3.0–12.0)
Neutro Abs: 4.2 10*3/uL (ref 1.4–7.7)
Neutrophils Relative %: 77 % (ref 43.0–77.0)
Platelets: 126 10*3/uL — ABNORMAL LOW (ref 150.0–400.0)
RBC: 4.6 Mil/uL (ref 3.87–5.11)
RDW: 15.3 % (ref 11.5–15.5)
WBC: 5.4 10*3/uL (ref 4.0–10.5)

## 2017-03-07 LAB — BASIC METABOLIC PANEL
BUN: 44 mg/dL — ABNORMAL HIGH (ref 6–23)
CHLORIDE: 104 meq/L (ref 96–112)
CO2: 32 mEq/L (ref 19–32)
Calcium: 9.5 mg/dL (ref 8.4–10.5)
Creatinine, Ser: 1.87 mg/dL — ABNORMAL HIGH (ref 0.40–1.20)
GFR: 26.89 mL/min — ABNORMAL LOW (ref >60.00)
GLUCOSE: 103 mg/dL — AB (ref 70–99)
POTASSIUM: 4.2 meq/L (ref 3.5–5.1)
SODIUM: 142 meq/L (ref 135–145)

## 2017-03-08 ENCOUNTER — Encounter (HOSPITAL_COMMUNITY): Payer: Self-pay | Admitting: Emergency Medicine

## 2017-03-08 ENCOUNTER — Observation Stay (HOSPITAL_COMMUNITY)
Admission: EM | Admit: 2017-03-08 | Discharge: 2017-03-10 | Disposition: A | Discharge status: Home / Self Care | Payer: Medicare Other | Attending: Internal Medicine | Admitting: Internal Medicine

## 2017-03-08 ENCOUNTER — Emergency Department (HOSPITAL_COMMUNITY): Payer: Medicare Other

## 2017-03-08 DIAGNOSIS — K219 Gastro-esophageal reflux disease without esophagitis: Secondary | ICD-10-CM | POA: Insufficient documentation

## 2017-03-08 DIAGNOSIS — Z950 Presence of cardiac pacemaker: Secondary | ICD-10-CM | POA: Insufficient documentation

## 2017-03-08 DIAGNOSIS — N179 Acute kidney failure, unspecified: Secondary | ICD-10-CM | POA: Diagnosis not present

## 2017-03-08 DIAGNOSIS — E039 Hypothyroidism, unspecified: Secondary | ICD-10-CM | POA: Insufficient documentation

## 2017-03-08 DIAGNOSIS — R079 Chest pain, unspecified: Secondary | ICD-10-CM

## 2017-03-08 DIAGNOSIS — I340 Nonrheumatic mitral (valve) insufficiency: Secondary | ICD-10-CM | POA: Diagnosis present

## 2017-03-08 DIAGNOSIS — I050 Rheumatic mitral stenosis: Secondary | ICD-10-CM | POA: Diagnosis not present

## 2017-03-08 DIAGNOSIS — I5042 Chronic combined systolic (congestive) and diastolic (congestive) heart failure: Secondary | ICD-10-CM | POA: Insufficient documentation

## 2017-03-08 DIAGNOSIS — Z8249 Family history of ischemic heart disease and other diseases of the circulatory system: Secondary | ICD-10-CM | POA: Insufficient documentation

## 2017-03-08 DIAGNOSIS — I5032 Chronic diastolic (congestive) heart failure: Secondary | ICD-10-CM | POA: Diagnosis not present

## 2017-03-08 DIAGNOSIS — I509 Heart failure, unspecified: Secondary | ICD-10-CM | POA: Diagnosis not present

## 2017-03-08 DIAGNOSIS — I480 Paroxysmal atrial fibrillation: Secondary | ICD-10-CM | POA: Diagnosis not present

## 2017-03-08 DIAGNOSIS — N189 Chronic kidney disease, unspecified: Secondary | ICD-10-CM | POA: Diagnosis not present

## 2017-03-08 DIAGNOSIS — N184 Chronic kidney disease, stage 4 (severe): Secondary | ICD-10-CM | POA: Diagnosis not present

## 2017-03-08 DIAGNOSIS — Z7901 Long term (current) use of anticoagulants: Secondary | ICD-10-CM | POA: Diagnosis not present

## 2017-03-08 DIAGNOSIS — I4891 Unspecified atrial fibrillation: Secondary | ICD-10-CM | POA: Diagnosis not present

## 2017-03-08 DIAGNOSIS — E785 Hyperlipidemia, unspecified: Secondary | ICD-10-CM | POA: Diagnosis not present

## 2017-03-08 DIAGNOSIS — Z79899 Other long term (current) drug therapy: Secondary | ICD-10-CM | POA: Insufficient documentation

## 2017-03-08 DIAGNOSIS — I130 Hypertensive heart and chronic kidney disease with heart failure and stage 1 through stage 4 chronic kidney disease, or unspecified chronic kidney disease: Principal | ICD-10-CM | POA: Insufficient documentation

## 2017-03-08 DIAGNOSIS — Z85828 Personal history of other malignant neoplasm of skin: Secondary | ICD-10-CM | POA: Diagnosis not present

## 2017-03-08 DIAGNOSIS — Z888 Allergy status to other drugs, medicaments and biological substances status: Secondary | ICD-10-CM | POA: Diagnosis not present

## 2017-03-08 DIAGNOSIS — D696 Thrombocytopenia, unspecified: Secondary | ICD-10-CM | POA: Diagnosis not present

## 2017-03-08 LAB — BASIC METABOLIC PANEL
Anion gap: 6 (ref 5–15)
BUN: 41 mg/dL — AB (ref 6–20)
CHLORIDE: 107 mmol/L (ref 101–111)
CO2: 26 mmol/L (ref 22–32)
CREATININE: 2.18 mg/dL — AB (ref 0.44–1.00)
Calcium: 8.7 mg/dL — ABNORMAL LOW (ref 8.9–10.3)
GFR calc non Af Amer: 19 mL/min — ABNORMAL LOW (ref >60)
GFR, EST AFRICAN AMERICAN: 22 mL/min — AB (ref >60)
Glucose, Bld: 107 mg/dL — ABNORMAL HIGH (ref 65–99)
Potassium: 4.3 mmol/L (ref 3.5–5.1)
Sodium: 139 mmol/L (ref 135–145)

## 2017-03-08 LAB — CBC
HCT: 41.8 % (ref 36.0–46.0)
Hemoglobin: 13.5 g/dL (ref 12.0–15.0)
MCH: 31 pg (ref 26.0–34.0)
MCHC: 32.3 g/dL (ref 30.0–36.0)
MCV: 96.1 fL (ref 78.0–100.0)
PLATELETS: 121 10*3/uL — AB (ref 150–400)
RBC: 4.35 MIL/uL (ref 3.87–5.11)
RDW: 15.1 % (ref 11.5–15.5)
WBC: 4.6 10*3/uL (ref 4.0–10.5)

## 2017-03-08 LAB — I-STAT TROPONIN, ED: Troponin i, poc: 0.02 ng/mL (ref 0.00–0.08)

## 2017-03-08 LAB — PROTIME-INR
INR: 1.23
Prothrombin Time: 15.6 seconds — ABNORMAL HIGH (ref 11.4–15.2)

## 2017-03-08 MED ORDER — SENNA 8.6 MG PO TABS
2.0000 | ORAL_TABLET | Freq: Every evening | ORAL | Status: DC | PRN
  Filled 2017-03-08: qty 2

## 2017-03-08 MED ORDER — AMIODARONE HCL 200 MG PO TABS
200.0000 mg | ORAL_TABLET | Freq: Every day | ORAL | Status: DC
  Administered 2017-03-09 – 2017-03-10 (×2): 200 mg via ORAL
  Filled 2017-03-08 (×2): qty 1

## 2017-03-08 MED ORDER — SIMVASTATIN 10 MG PO TABS
10.0000 mg | ORAL_TABLET | Freq: Every day | ORAL | Status: DC
  Administered 2017-03-09 (×2): 10 mg via ORAL
  Filled 2017-03-08 (×3): qty 1

## 2017-03-08 MED ORDER — NITROGLYCERIN 0.4 MG SL SUBL
0.4000 mg | SUBLINGUAL_TABLET | SUBLINGUAL | Status: DC | PRN
  Administered 2017-03-10: 0.4 mg via SUBLINGUAL
  Filled 2017-03-08: qty 1

## 2017-03-08 MED ORDER — SODIUM CHLORIDE 0.9 % IV SOLN
INTRAVENOUS | Status: DC
  Administered 2017-03-08 – 2017-03-09 (×2): via INTRAVENOUS

## 2017-03-08 MED ORDER — ONDANSETRON HCL 4 MG/2ML IJ SOLN: 4.0000 mg | Freq: Four times a day (QID) | INTRAMUSCULAR | Status: DC | PRN

## 2017-03-08 MED ORDER — HYDROCODONE-ACETAMINOPHEN 5-325 MG PO TABS
1.0000 | ORAL_TABLET | Freq: Four times a day (QID) | ORAL | Status: DC | PRN
  Administered 2017-03-09 (×2): 1 via ORAL
  Filled 2017-03-08 (×2): qty 1

## 2017-03-08 MED ORDER — APIXABAN 2.5 MG PO TABS
2.5000 mg | ORAL_TABLET | Freq: Two times a day (BID) | ORAL | Status: DC
  Administered 2017-03-09 – 2017-03-10 (×4): 2.5 mg via ORAL
  Filled 2017-03-08 (×5): qty 1

## 2017-03-08 MED ORDER — FERROUS SULFATE 325 (65 FE) MG PO TABS
325.0000 mg | ORAL_TABLET | Freq: Every day | ORAL | Status: DC
  Administered 2017-03-09 – 2017-03-10 (×2): 325 mg via ORAL
  Filled 2017-03-08 (×2): qty 1

## 2017-03-08 MED ORDER — SODIUM CHLORIDE 0.9 % IV BOLUS (SEPSIS)
250.0000 mL | Freq: Once | INTRAVENOUS | Status: AC
  Administered 2017-03-08: 250 mL via INTRAVENOUS

## 2017-03-08 MED ORDER — MECLIZINE HCL 12.5 MG PO TABS: 12.5000 mg | ORAL_TABLET | Freq: Two times a day (BID) | ORAL | Status: DC | PRN

## 2017-03-08 MED ORDER — ACETAMINOPHEN 325 MG PO TABS
650.0000 mg | ORAL_TABLET | ORAL | Status: DC | PRN
  Administered 2017-03-08: 650 mg via ORAL
  Filled 2017-03-08: qty 2

## 2017-03-08 MED ORDER — PANTOPRAZOLE SODIUM 40 MG PO TBEC
40.0000 mg | DELAYED_RELEASE_TABLET | Freq: Every day | ORAL | Status: DC
  Administered 2017-03-09 – 2017-03-10 (×2): 40 mg via ORAL
  Filled 2017-03-08 (×2): qty 1

## 2017-03-08 MED ORDER — BACLOFEN 10 MG PO TABS
10.0000 mg | ORAL_TABLET | Freq: Three times a day (TID) | ORAL | Status: DC | PRN
  Filled 2017-03-08: qty 1

## 2017-03-08 MED ORDER — GI COCKTAIL ~~LOC~~: 30.0000 mL | Freq: Four times a day (QID) | ORAL | Status: DC | PRN

## 2017-03-08 MED ORDER — LEVOTHYROXINE SODIUM 75 MCG PO TABS
75.0000 ug | ORAL_TABLET | Freq: Every day | ORAL | Status: DC
  Administered 2017-03-09 – 2017-03-10 (×2): 75 ug via ORAL
  Filled 2017-03-08 (×2): qty 1

## 2017-03-08 MED ORDER — POTASSIUM CHLORIDE CRYS ER 20 MEQ PO TBCR
40.0000 meq | EXTENDED_RELEASE_TABLET | Freq: Every day | ORAL | Status: DC
  Administered 2017-03-09 – 2017-03-10 (×2): 40 meq via ORAL
  Filled 2017-03-08 (×2): qty 2

## 2017-03-08 NOTE — ED Notes (Signed)
Patient transported to X-ray 

## 2017-03-08 NOTE — H&P (Signed)
History and Physical    [name] MPN:[id number] DOB: Apr 12, 1925 DOA: 03/08/2017  Referring MD/NP/PA: Dr. Ericka Pontiff PCP: [name], MD  Patient coming from: Home via EMS  Chief Complaint: Chest pain  HPI: [name] is a 83 y.o. female with medical history significant of HTN, HLD, paroxysmal atrial fibrillation s/p PPM on Eliquis, diastolic heart failure, hypothyroidism, thrombocytopenia, CKD stage IV, and MGUS; who presents with complaints of chest discomfort since 3 PM. Patient reports having discomfort substernally that feels like someone is placing dry ice on her chest. Symptoms have been constant and associated symptoms of increased lethargyand reported weight loss of 7 pounds in the last month or 2. She reports falling asleep multiple times today and symptoms persisting. Patient denies any significant shortness of breath, diaphoresis, nausea, vomiting, fever, chills, or dysuria. Previously, evaluated by her PCP for similar chest discomfort that she thinks may be related to her pacemaker, but states that. She has a follow up appointment with her cardiologist Dr. Jearld Pies in June.   ED Course: Upon admission into the emergency department patient was seen to be afebrile with blood pressure noted to be as low as 83/52.blood pressures improved after 250 mL bolus. Lab work revealed BUN 41, creatinine 2.18. EKG showed no acute ischemic changes and troponin was noted to be negative.  Review of Systems: As per HPI otherwise 10 point review of systems negative.   Past Medical History:  Diagnosis Date  . Anemia   . Atrial fibrillation ([place])    a. Dx 01/2014->Eliquis started.  . Cancer ([place])   . Chest pain    a. 2011 Neg MV;  b. 01/2014 Cath: LM 20-30, LAD nl, D1 nl, LCX nl, OM1 nl, RCA dom 30-33m, PD/PL nl, EF 65%->Med Rx; c. 03/2016 MV: no ischemia/infarct. EF 63%.  . Degenerative joint disease   . Diastolic CHF, acute on chronic ([place]) 01/31/2012  . Dyslipidemia   .  Dysrhythmia   . Family history of adverse reaction to anesthesia    " multiple family members have difficulty waking "  . GERD (gastroesophageal reflux disease)   . History of skin cancer    tafeen/ non melanoma.   Marland Kitchen Hx of varicella   . Hypertension   . Hypothyroidism   . Monoclonal gammopathies   . Pacemaker   . Palpitations    PVCs/bigeminy on event in May 2009 revealing this was relatively asymptomatic  . Right lower quadrant abdominal pain 07/19/2012   Recurrent  With nausea    This is new since she had ct for llq pain in [facility]    R/o appendiceal problem  Hernia  Get ct scan  And plan  Fu     . Thrombocytopenia ([place])     Past Surgical History:  Procedure Laterality Date  . CARDIOVERSION N/A 02/26/2014   Procedure: CARDIOVERSION AT BEDSIDE;  Surgeon: Pixie Casino, MD;  Location: [place];  Service: Cardiovascular;  Laterality: N/A;  . CARDIOVERSION N/A 04/22/2014   Procedure: CARDIOVERSION (BEDSIDE);  Surgeon: [name], MD;  Location: [facility] OR;  Service: Cardiovascular;  Laterality: N/A;  . CATARACT EXTRACTION     Bilateral  implantt  . CESAREAN SECTION     times 2  . CHOLECYSTECTOMY  1999  . ESOPHAGOGASTRODUODENOSCOPY N/A 07/06/2014   Procedure: ESOPHAGOGASTRODUODENOSCOPY (EGD);  Surgeon: Ladene Artist, MD;  Location: [facility] ENDOSCOPY;  Service: Endoscopy;  Laterality: N/A;  . INSERT / REPLACE / REMOVE PACEMAKER    . LEFT HEART CATHETERIZATION WITH CORONARY  ANGIOGRAM N/A 01/29/2014   Procedure: LEFT HEART CATHETERIZATION WITH CORONARY ANGIOGRAM;  Surgeon: Blane Ohara, MD;  Location: [facility] CATH LAB;  Service: Cardiovascular;  Laterality: N/A;  . PACEMAKER INSERTION  04/23/2014   STJ Assurity dual chamber pacemaker implanted by Dr Rayann Heman  . PERMANENT PACEMAKER INSERTION N/A 04/23/2014   Procedure: PERMANENT PACEMAKER INSERTION;  Surgeon: Evans Lance, MD;  Location: [facility] CATH LAB;  Service: Cardiovascular;  Laterality: N/A;  . RIGHT HEART CATHETERIZATION N/A 09/22/2014   Procedure:  RIGHT HEART CATH;  Surgeon: Jolaine Artist, MD;  Location: [facility] CATH LAB;  Service: Cardiovascular;  Laterality: N/A;  . SHOULDER SURGERY  1996   Right      reports that she has never smoked. She has never used smokeless tobacco. She reports that she does not drink alcohol or use drugs.  Allergies  Allergen Reactions  . Other Shortness Of Breath    Detergents, perfumes and other strong odor emitting compounds  . Tramadol Shortness Of Breath and Nausea Only  . Ibuprofen Other (See Comments)    Told not to take med    Family History  Problem Relation Age of Onset  . Coronary artery disease Father   . Heart disease Father   . Heart attack Father   . Alzheimer's disease Mother   . Lung cancer Brother 26  . Lung cancer      Prior to Admission medications   Medication Sig Start Date End Date Taking? Authorizing Provider  amiodarone (PACERONE) 200 MG tablet Take 1 tablet (200 mg total) by mouth daily. 11/25/16   Jolaine Artist, MD  baclofen (LIORESAL) 10 MG tablet Take 10 mg by mouth 3 (three) times daily.    Historical Provider, MD  denosumab (PROLIA) 60 MG/ML SOLN injection Inject 60 mg into the skin every 6 (six) months. Administer in upper arm, thigh, or abdomen    Historical Provider, MD  ELIQUIS 2.5 MG TABS tablet TAKE ONE TABLET BY MOUTH TWICE DAILY 10/18/16   Jolaine Artist, MD  Ferrous Sulfate (IRON) 325 (65 Fe) MG TABS Take 1 tablet by mouth daily. 07/01/16   Clanford Marisa Hua, MD  HYDROcodone-acetaminophen (NORCO/VICODIN) 5-325 MG tablet  11/20/16   Historical Provider, MD  KLOR-CON M20 20 MEQ tablet TAKE THREE TABLETS BY MOUTH ONCE DAILY. TAKE AN ADDITIONAL 20 MEQ WITH METOLAZONE 02/14/17   Larey Dresser, MD  levothyroxine (SYNTHROID, LEVOTHROID) 75 MCG tablet TAKE ONE TABLET BY MOUTH ONCE DAILY. 01/24/17   Burnis Medin, MD  meclizine (ANTIVERT) 12.5 MG tablet Take 1 tablet (12.5 mg total) by mouth 2 (two) times daily as needed for dizziness. 09/14/16   Jolaine Artist, MD  metolazone (ZAROXOLYN) 2.5 MG tablet Take 1 tablet (2.5 mg total) by mouth as needed (for weight 134 lb or greater). 03/09/16   Jolaine Artist, MD  nitroGLYCERIN (NITROSTAT) 0.4 MG SL tablet Place 1 tablet (0.4 mg total) under the tongue every 5 (five) minutes as needed. For chest pain 05/06/16   Jolaine Artist, MD  pantoprazole (PROTONIX) 40 MG tablet Take 40 mg by mouth daily.    Historical Provider, MD  potassium chloride SA (K-DUR,KLOR-CON) 20 MEQ tablet Take 2 tablets (40 mEq total) by mouth daily. Take an additional 35meq with Metolazone. 10/04/16   Jolaine Artist, MD  senna (SENOKOT) 8.6 MG TABS tablet Take 2 tablets (17.2 mg total) by mouth at bedtime. 11/13/16   Doreatha Lew, MD  simvastatin (ZOCOR) 10 MG tablet  TAKE ONE TABLET BY MOUTH AT BEDTIME 02/14/17   Larey Dresser, MD  torsemide (DEMADEX) 20 MG tablet Take 2 tablets (40 mg total) by mouth daily. Please start on 11/22/2016 11/22/16   Albertine Patricia, MD    Physical Exam:  Constitutional: elderly female in NAD, calm, comfortable Vitals:   03/08/17 2130 03/08/17 2145 03/08/17 2226 03/08/17 2230  BP: 124/66 (!) 99/52 120/89 106/86  Pulse: 70 73 73 71  Resp: 17 17  15   Temp:      TempSrc:      SpO2: 99% 97% 100% 99%   Eyes: PERRL, lids and conjunctivae normal ENMT: Mucous membranes are dry. Posterior pharynx clear of any exudate or lesions. Neck: normal, supple, no masses, no thyromegaly Respiratory: clear to auscultation bilaterally, no wheezing, no crackles. Normal respiratory effort. No accessory muscle use.  Cardiovascular: regular rate and rhythm, no murmurs / rubs / gallops. No extremity edema. 2+ pedal pulses. No carotid bruits.  Abdomen: no tenderness, no masses palpated. No hepatosplenomegaly. Bowel sounds positive.  Musculoskeletal: no clubbing / cyanosis. No joint deformity upper and lower extremities. Good ROM, no contractures. Normal muscle tone.  Skin: no rashes, lesions, ulcers. No  induration Neurologic: CN 2-12 grossly intact. Sensation intact, DTR normal. Strength 5/5 in all 4.  Psychiatric: Normal judgment and insight. Alert and oriented x 3. Normal mood.     Labs on Admission: I have personally reviewed following labs and imaging studies  CBC:  Recent Labs Lab 03/07/17 1404 03/08/17 1942  WBC 5.4 4.6  NEUTROABS 4.2  --   HGB 14.4 13.5  HCT 43.6 41.8  MCV 94.7 96.1  PLT 126.0* 423*   Basic Metabolic Panel:  Recent Labs Lab 03/07/17 1404 03/08/17 1942  NA 142 139  K 4.2 4.3  CL 104 107  CO2 32 26  GLUCOSE 103* 107*  BUN 44* 41*  CREATININE 1.87* 2.18*  CALCIUM 9.5 8.7*   GFR: Estimated Creatinine Clearance: 12.7 mL/min (A) (by C-G formula based on SCr of 2.18 mg/dL (H)). Liver Function Tests: No results for input(s): AST, ALT, ALKPHOS, BILITOT, PROT, ALBUMIN in the last 168 hours. No results for input(s): LIPASE, AMYLASE in the last 168 hours. No results for input(s): AMMONIA in the last 168 hours. Coagulation Profile:  Recent Labs Lab 03/08/17 1942  INR 1.23   Cardiac Enzymes: No results for input(s): CKTOTAL, CKMB, CKMBINDEX, TROPONINI in the last 168 hours. BNP (last 3 results) No results for input(s): PROBNP in the last 8760 hours. HbA1C: No results for input(s): HGBA1C in the last 72 hours. CBG: No results for input(s): GLUCAP in the last 168 hours. Lipid Profile: No results for input(s): CHOL, HDL, LDLCALC, TRIG, CHOLHDL, LDLDIRECT in the last 72 hours. Thyroid Function Tests: No results for input(s): TSH, T4TOTAL, FREET4, T3FREE, THYROIDAB in the last 72 hours. Anemia Panel: No results for input(s): VITAMINB12, FOLATE, FERRITIN, TIBC, IRON, RETICCTPCT in the last 72 hours. Urine analysis:    Component Value Date/Time   COLORURINE STRAW (A) 01/27/2017 2037   APPEARANCEUR CLEAR 01/27/2017 2037   LABSPEC 1.006 01/27/2017 2037   PHURINE 5.0 01/27/2017 2037   GLUCOSEU NEGATIVE 01/27/2017 2037   GLUCOSEU NEGATIVE  12/19/2011 0956   HGBUR NEGATIVE 01/27/2017 2037   BILIRUBINUR neg 02/27/2017 [place] 01/27/2017 2037   PROTEINUR neg 02/27/2017 1618   PROTEINUR NEGATIVE 01/27/2017 2037   UROBILINOGEN 0.2 02/27/2017 1618   UROBILINOGEN 0.2 09/10/2015 1150   NITRITE neg 02/27/2017 1618  NITRITE NEGATIVE 01/27/2017 2037   LEUKOCYTESUR Negative 02/27/2017 1618   Sepsis Labs: Recent Results (from the past 240 hour(s))  Urine culture     Status: None   Collection Time: 02/27/17  4:30 PM  Result Value Ref Range Status   Organism ID, Bacteria NO GROWTH  Final     Radiological Exams on Admission: Dg Chest 2 View  Result Date: 03/08/2017 CLINICAL DATA:  Chest pain EXAM: CHEST  2 VIEW COMPARISON:  01/27/2017 FINDINGS: Cardiac shadow is stable. Pacing device is again seen stable perihilar well-aerated bilaterally through mild atelectatic changes the left base is again identified. No sizable infiltrate is noted. Degenerative changes of the thoracic spine are seen. IMPRESSION: Stable left basilar atelectasis. Electronically Signed   By: Inez Catalina M.D.   On: 03/08/2017 19:59    EKG: Independently reviewed. Accelerated junctional rhythm at 70 bpm  Assessment/Plan Chest pain: Acute. Patient reports being followed by Dr. Jearld Pies of cardiology. Heart score = 5. -  Admit to telemetry bed - cycle CE q 3 hr x3 and repeat EKG in the am  - Nitroglycerin prn, aspirin - Risk factor stratification: will check FLP  - 2d echo - will need to consult Cardiology in a.m.   Acute kidney injury on chronic kidney disease stage IV: patient's baseline creatinine had previously been 1.87 with BUN 44 the previous day. On admission and creatinine noted to be 2.18 and BUN 41. Review of records shows patient had issues with dehydration in the past. - NS IVF at 75 ml/hr - Recheck bmp in a.m.  Atrial fibrillation on chronic anticoagulation with PPM: Chronic. Currently rate controlled. chadvasc score 5 - Continue  home medication amiodarone and eliquis  Chronic diastolic heart failure: Stable. Echo done March 2017 with and EF of 60% grade 2 diastolic - strict I&Os - held torsemide and prn metolazone, restart when medically appropriate  Hypothyroidism: TSH noted to be 3.28 on 4/16. - continue levothyroxine  Thrombocytopenia: chronic. Patient presents with a platelet count 121. No active source of bleeding noted. - continue to monitor  Dyslipidemia - Continue simvastatin  GERD - Continue Protonix  DVT prophylaxis: Eliquis Code Status: Full  Family Communication: No family present at bedside Disposition Plan: Likely discharge home   Consults called: none Admission status: Observation  Norval Morton MD [facility] Pager [phone]  If 7PM-7AM, please contact night-coverage [email or web address] Password [facility]  03/08/2017, 10:43 PM

## 2017-03-08 NOTE — ED Notes (Signed)
ED Provider at bedside. 

## 2017-03-08 NOTE — ED Notes (Addendum)
Assisted pt in ambulating to bathroom. Pt able to ambulate, using her cane, with only standby assist. Denies SOB, dizziness, lightheadedness.

## 2017-03-08 NOTE — ED Provider Notes (Signed)
[facility] DEPT Provider Note   CSN: [id number] Arrival date & time: 03/08/17  1937     History   Chief Complaint Chief Complaint  Patient presents with  . Chest Pain    HPI [name] is a 83 y.o. female.  The history is provided by the patient and medical records.  Chest Pain   This is a recurrent problem. The current episode started 3 to 5 hours ago. The problem occurs rarely. The problem has been resolved. The pain is associated with rest (Woke from a nap w/the pain). The pain is present in the substernal region. The pain is moderate. Quality: "felt like dry ice" The pain does not radiate. Episode Length: Estimates several hours. Associated symptoms include dizziness and malaise/fatigue. Pertinent negatives include no abdominal pain, no back pain, no cough, no diaphoresis, no fever, no lower extremity edema, no palpitations, no shortness of breath, no syncope, no vomiting and no weakness. She has tried rest for the symptoms. The treatment provided significant relief. Risk factors include being elderly.  Her past medical history is significant for arrhythmia, CHF and pacemaker.  Pertinent negatives for past medical history include no seizures.  Procedure history is positive for echocardiogram.    Past Medical History:  Diagnosis Date  . Anemia   . Atrial fibrillation ([place])    a. Dx 01/2014->Eliquis started.  . Cancer ([place])   . Chest pain    a. 2011 Neg MV;  b. 01/2014 Cath: LM 20-30, LAD nl, D1 nl, LCX nl, OM1 nl, RCA dom 30-66m, PD/PL nl, EF 65%->Med Rx; c. 03/2016 MV: no ischemia/infarct. EF 63%.  . Degenerative joint disease   . Diastolic CHF, acute on chronic ([place]) 01/31/2012  . Dyslipidemia   . Dysrhythmia   . Family history of adverse reaction to anesthesia    " multiple family members have difficulty waking "  . GERD (gastroesophageal reflux disease)   . History of skin cancer    tafeen/ non melanoma.   Marland Kitchen Hx of varicella   . Hypertension   .  Hypothyroidism   . Monoclonal gammopathies   . Pacemaker   . Palpitations    PVCs/bigeminy on event in May 2009 revealing this was relatively asymptomatic  . Right lower quadrant abdominal pain 07/19/2012   Recurrent  With nausea    This is new since she had ct for llq pain in [facility]    R/o appendiceal problem  Hernia  Get ct scan  And plan  Fu     . Thrombocytopenia [facility])     Patient Active Problem List   Diagnosis Date Noted  . Dehydration 11/18/2016  . Intractable nausea and vomiting 11/18/2016  . Generalized weakness 11/18/2016  . AKI (acute kidney injury) ([place]) 11/11/2016  . Hypokalemia 11/11/2016  . Hypertensive heart disease 04/01/2016  . PAF (paroxysmal atrial fibrillation) ([place]) 03/31/2016  . Chest pain with moderate risk for cardiac etiology, negative MI and Nuc study, + muscular skeletal   . Atypical chest pain 01/21/2016  . CKD (chronic kidney disease) stage 4, GFR 15-29 ml/min (HCC) 05/09/2015  . Anemia of chronic disease 05/09/2015  . Dyslipidemia 05/09/2015  . Iron deficiency anemia 11/21/2014  . Pacemaker 04/27/2014  . Chronic anticoagulation 02/25/2014  . Chronic combined systolic and diastolic CHF (congestive heart failure) ([place]) 02/24/2014  . Atrial fibrillation ([place]) 01/29/2014  . MGUS (monoclonal gammopathy of unknown significance) 08/01/2013  . Hypothyroidism 02/19/2012  . Hyperlipidemia 12/15/2008  . HYPERTENSION, BENIGN 12/15/2008    Past Surgical  History:  Procedure Laterality Date  . CARDIOVERSION N/A 02/26/2014   Procedure: CARDIOVERSION AT BEDSIDE;  Surgeon: Pixie Casino, MD;  Location: [facility];  Service: Cardiovascular;  Laterality: N/A;  . CARDIOVERSION N/A 04/22/2014   Procedure: CARDIOVERSION (BEDSIDE);  Surgeon: [name], MD;  Location: [facility] OR;  Service: Cardiovascular;  Laterality: N/A;  . CATARACT EXTRACTION     Bilateral  implantt  . CESAREAN SECTION     times 2  . CHOLECYSTECTOMY  1999  . ESOPHAGOGASTRODUODENOSCOPY N/A 07/06/2014    Procedure: ESOPHAGOGASTRODUODENOSCOPY (EGD);  Surgeon: Ladene Artist, MD;  Location: [facility] ENDOSCOPY;  Service: Endoscopy;  Laterality: N/A;  . INSERT / REPLACE / REMOVE PACEMAKER    . LEFT HEART CATHETERIZATION WITH CORONARY ANGIOGRAM N/A 01/29/2014   Procedure: LEFT HEART CATHETERIZATION WITH CORONARY ANGIOGRAM;  Surgeon: Blane Ohara, MD;  Location: [facility] CATH LAB;  Service: Cardiovascular;  Laterality: N/A;  . PACEMAKER INSERTION  04/23/2014   STJ Assurity dual chamber pacemaker implanted by Dr Rayann Heman  . PERMANENT PACEMAKER INSERTION N/A 04/23/2014   Procedure: PERMANENT PACEMAKER INSERTION;  Surgeon: Evans Lance, MD;  Location: [facility] CATH LAB;  Service: Cardiovascular;  Laterality: N/A;  . RIGHT HEART CATHETERIZATION N/A 09/22/2014   Procedure: RIGHT HEART CATH;  Surgeon: Jolaine Artist, MD;  Location[facility] CATH LAB;  Service: Cardiovascular;  Laterality: N/A;  . SHOULDER SURGERY  1996   Right     OB History    No data available       Home Medications    Prior to Admission medications   Medication Sig Start Date End Date Taking? Authorizing Provider  amiodarone (PACERONE) 200 MG tablet Take 1 tablet (200 mg total) by mouth daily. 11/25/16   Jolaine Artist, MD  baclofen (LIORESAL) 10 MG tablet Take 10 mg by mouth 3 (three) times daily.    Historical Provider, MD  denosumab (PROLIA) 60 MG/ML SOLN injection Inject 60 mg into the skin every 6 (six) months. Administer in upper arm, thigh, or abdomen    Historical Provider, MD  ELIQUIS 2.5 MG TABS tablet TAKE ONE TABLET BY MOUTH TWICE DAILY 10/18/16   Jolaine Artist, MD  Ferrous Sulfate (IRON) 325 (65 Fe) MG TABS Take 1 tablet by mouth daily. 07/01/16   [place] Marisa Hua, MD  HYDROcodone-acetaminophen (NORCO/VICODIN) 5-325 MG tablet  11/20/16   Historical Provider, MD  KLOR-CON M20 20 MEQ tablet TAKE THREE TABLETS BY MOUTH ONCE DAILY. TAKE AN ADDITIONAL 20 MEQ WITH METOLAZONE 02/14/17   Larey Dresser, MD  levothyroxine (SYNTHROID,  LEVOTHROID) 75 MCG tablet TAKE ONE TABLET BY MOUTH ONCE DAILY. 01/24/17   Burnis Medin, MD  meclizine (ANTIVERT) 12.5 MG tablet Take 1 tablet (12.5 mg total) by mouth 2 (two) times daily as needed for dizziness. 09/14/16   Jolaine Artist, MD  metolazone (ZAROXOLYN) 2.5 MG tablet Take 1 tablet (2.5 mg total) by mouth as needed (for weight 134 lb or greater). 03/09/16   Jolaine Artist, MD  nitroGLYCERIN (NITROSTAT) 0.4 MG SL tablet Place 1 tablet (0.4 mg total) under the tongue every 5 (five) minutes as needed. For chest pain 05/06/16   Jolaine Artist, MD  pantoprazole (PROTONIX) 40 MG tablet Take 40 mg by mouth daily.    Historical Provider, MD  potassium chloride SA (K-DUR,KLOR-CON) 20 MEQ tablet Take 2 tablets (40 mEq total) by mouth daily. Take an additional 25meq with Metolazone. 10/04/16   Jolaine Artist, MD  senna (SENOKOT) 8.6  MG TABS tablet Take 2 tablets (17.2 mg total) by mouth at bedtime. 11/13/16   Doreatha Lew, MD  simvastatin (ZOCOR) 10 MG tablet TAKE ONE TABLET BY MOUTH AT BEDTIME 02/14/17   Larey Dresser, MD  torsemide (DEMADEX) 20 MG tablet Take 2 tablets (40 mg total) by mouth daily. Please start on 11/22/2016 11/22/16   Albertine Patricia, MD    Family History Family History  Problem Relation Age of Onset  . Coronary artery disease Father   . Heart disease Father   . Heart attack Father   . Alzheimer's disease Mother   . Lung cancer Brother 77  . Lung cancer      Social History Social History  Substance Use Topics  . Smoking status: Never Smoker  . Smokeless tobacco: Never Used  . Alcohol use No     Allergies   Other; Tramadol; and Ibuprofen   Review of Systems Review of Systems  Constitutional: Positive for malaise/fatigue. Negative for chills, diaphoresis and fever.  HENT: Negative for ear pain and sore throat.   Eyes: Negative for pain and visual disturbance.  Respiratory: Negative for cough and shortness of breath.   Cardiovascular:  Positive for chest pain. Negative for palpitations and syncope.  Gastrointestinal: Negative for abdominal pain and vomiting.  Genitourinary: Negative for dysuria and hematuria.  Musculoskeletal: Negative for arthralgias and back pain.  Skin: Negative for color change and rash.  Neurological: Positive for dizziness. Negative for seizures, syncope and weakness.  All other systems reviewed and are negative.    Physical Exam Updated Vital Signs BP 103/66 (BP Location: Right Arm)   Pulse 70   Temp 98.6 F (37 C) (Oral)   Resp 18   SpO2 98%   Physical Exam  Constitutional: She is oriented to person, place, and time. She appears well-developed and well-nourished. No distress.  Frail-appearing elderly woman  HENT:  Head: Normocephalic and atraumatic.  Eyes: Conjunctivae are normal.  Neck: Neck supple.  Cardiovascular: Normal rate and regular rhythm.   No murmur heard. Pulmonary/Chest: Effort normal and breath sounds normal. No respiratory distress.  Abdominal: Soft. There is no tenderness.  Musculoskeletal: She exhibits no edema.  Neurological: She is alert and oriented to person, place, and time.  Skin: Skin is warm and dry. Capillary refill takes less than 2 seconds.  Psychiatric: She has a normal mood and affect.  Nursing note and vitals reviewed.    ED Treatments / Results  Labs (all labs ordered are listed, but only abnormal results are displayed) Labs Reviewed  BASIC METABOLIC PANEL - Abnormal; Notable for the following:       Result Value   Glucose, Bld 107 (*)    BUN 41 (*)    Creatinine, Ser 2.18 (*)    Calcium 8.7 (*)    GFR calc non Af Amer 19 (*)    GFR calc Af Amer 22 (*)    All other components within normal limits  CBC - Abnormal; Notable for the following:    Platelets 121 (*)    All other components within normal limits  PROTIME-INR - Abnormal; Notable for the following:    Prothrombin Time 15.6 (*)    All other components within normal limits    TROPONIN I  TROPONIN I  TROPONIN I  CBC WITH DIFFERENTIAL/PLATELET  BASIC METABOLIC PANEL  URINALYSIS, ROUTINE W REFLEX MICROSCOPIC  I-STAT TROPOININ, ED    EKG  EKG Interpretation  Date/Time:  Wednesday March 08 2017 19:36:43 EDT  Ventricular Rate:  70 PR Interval:    QRS Duration: 157 QT Interval:  477 QTC Calculation: 515 R Axis:   99 Text Interpretation:  Accelerated junctional rhythm Nonspecific intraventricular conduction delay Abnormal lateral Q waves Probable anteroseptal infarct, recent similar to prior EKG  Confirmed by LIU MD, DANA [id number]) on 03/08/2017 9:57:33 PM       Radiology Dg Chest 2 View  Result Date: 03/08/2017 CLINICAL DATA:  Chest pain EXAM: CHEST  2 VIEW COMPARISON:  01/27/2017 FINDINGS: Cardiac shadow is stable. Pacing device is again seen stable perihilar well-aerated bilaterally through mild atelectatic changes the left base is again identified. No sizable infiltrate is noted. Degenerative changes of the thoracic spine are seen. IMPRESSION: Stable left basilar atelectasis. Electronically Signed   By: Inez Catalina M.D.   On: 03/08/2017 19:59    Procedures Procedures (including critical care time)  Medications Ordered in ED Medications  acetaminophen (TYLENOL) tablet 650 mg (650 mg Oral Given 03/08/17 2342)  ondansetron (ZOFRAN) injection 4 mg (not administered)  gi cocktail (Maalox,Lidocaine,Donnatal) (not administered)  0.9 %  sodium chloride infusion ( Intravenous New Bag/Given 03/08/17 2336)  simvastatin (ZOCOR) tablet 10 mg (not administered)  baclofen (LIORESAL) tablet 10 mg (not administered)  levothyroxine (SYNTHROID, LEVOTHROID) tablet 75 mcg (not administered)  HYDROcodone-acetaminophen (NORCO/VICODIN) 5-325 MG per tablet 1 tablet (not administered)  amiodarone (PACERONE) tablet 200 mg (not administered)  senna (SENOKOT) tablet 17.2 mg (not administered)  potassium chloride SA (K-DUR,KLOR-CON) CR tablet 40 mEq (not administered)  apixaban  (ELIQUIS) tablet 2.5 mg (not administered)  pantoprazole (PROTONIX) EC tablet 40 mg (not administered)  meclizine (ANTIVERT) tablet 12.5 mg (not administered)  Iron TABS 1 tablet (not administered)  nitroGLYCERIN (NITROSTAT) SL tablet 0.4 mg (not administered)  sodium chloride 0.9 % bolus 250 mL (0 mLs Intravenous Stopped 03/08/17 2115)     Initial Impression / Assessment and Plan / ED Course  I have reviewed the triage vital signs and the nursing notes.  Pertinent labs & imaging results that were available during my care of the patient were reviewed by me and considered in my medical decision making (see chart for details).    Pt with h/o HTN, HLD, paroxysmal atrial fibrillation s/p PPM (on amiodarone & Eliquis), diastolic dysfunction (echo 3/17), hypothyroidism, CKD (baseline Crt ~2.2) presents with chest pain. Pt says that since yesterday, she has been sleepier than normal. Today she took a nap & felt a little dizzy upon waking, so went back to sleep; when she woke up the second time, she had chest discomfort that she described as feeling like "dry ice" was on her chest. She localized the pain to the left parasternal region w/o radiation, described it as moderate in intensity, said it lasted for several hours, and denies associated diaphoresis, SOB, N/V. A friend came over to check on her and called 911 to bring her here for evaluation. Denies F/C, HA, cough, SOB, N/V/D, urinary symptoms, or recent illness. Of note, the Pt was seen at her PCP recently for dizziness & weight loss which were attributed to polypharmacy.  VS & exam as above. EKG: paced rhythm @ 70bpm w/prolonged QTC (563ms) and no signs of ischemia; similar to tracing from 3/18. CXR w/stable left basilar atelectasis. Labs remarkable for BUN 41, Crt 2.18, troponin 0.02; all appear to be near her baseline.  Will admit the Pt to the Hospitalist's service for ACS rule out given her multiple cardiac risk factors.  Final Clinical  Impressions(s) / ED Diagnoses  Final diagnoses:  Chest pain, unspecified type    New Prescriptions New Prescriptions   No medications on file     Jenny Reichmann, MD 03/09/17 0002    Forde Dandy, MD 03/09/17 [phone]

## 2017-03-08 NOTE — ED Triage Notes (Signed)
Pt BIB EMS from home. Began having "chest discomfort" approx 1700 as she woke up from a nap & noticed the pain. Had similar discomfort years ago, but wasn't evaluated for it. Denies other s/sx. Took two nitro PTA of EMS, which had no effect on pain/discomfort. Pt has demand pacer. Hx of CHF, Afib.

## 2017-03-08 NOTE — ED Notes (Signed)
Contacted Petit, MD regarding hypotensive readings, both while pt was asleep and after waking. Received verbal order for 252mL NS bolus.

## 2017-03-09 ENCOUNTER — Encounter (HOSPITAL_COMMUNITY): Payer: Self-pay

## 2017-03-09 DIAGNOSIS — N179 Acute kidney failure, unspecified: Secondary | ICD-10-CM | POA: Diagnosis not present

## 2017-03-09 DIAGNOSIS — N189 Chronic kidney disease, unspecified: Secondary | ICD-10-CM | POA: Diagnosis not present

## 2017-03-09 DIAGNOSIS — I130 Hypertensive heart and chronic kidney disease with heart failure and stage 1 through stage 4 chronic kidney disease, or unspecified chronic kidney disease: Secondary | ICD-10-CM | POA: Diagnosis not present

## 2017-03-09 DIAGNOSIS — E039 Hypothyroidism, unspecified: Secondary | ICD-10-CM | POA: Diagnosis not present

## 2017-03-09 DIAGNOSIS — R079 Chest pain, unspecified: Secondary | ICD-10-CM | POA: Diagnosis not present

## 2017-03-09 DIAGNOSIS — D696 Thrombocytopenia, unspecified: Secondary | ICD-10-CM | POA: Diagnosis not present

## 2017-03-09 LAB — TROPONIN I
TROPONIN I: 0.03 ng/mL — AB (ref <0.03)
Troponin I: 0.03 ng/mL (ref <0.03)
Troponin I: 0.03 ng/mL (ref <0.03)
Troponin I: 0.04 ng/mL (ref <0.03)

## 2017-03-09 LAB — CBC WITH DIFFERENTIAL/PLATELET
BASOS ABS: 0 10*3/uL (ref 0.0–0.1)
BASOS PCT: 1 %
Eosinophils Absolute: 0.1 10*3/uL (ref 0.0–0.7)
Eosinophils Relative: 3 %
HCT: 39.9 % (ref 36.0–46.0)
Hemoglobin: 12.6 g/dL (ref 12.0–15.0)
Lymphocytes Relative: 28 %
Lymphs Abs: 1.3 10*3/uL (ref 0.7–4.0)
MCH: 30.5 pg (ref 26.0–34.0)
MCHC: 31.6 g/dL (ref 30.0–36.0)
MCV: 96.6 fL (ref 78.0–100.0)
Monocytes Absolute: 0.4 10*3/uL (ref 0.1–1.0)
Monocytes Relative: 8 %
NEUTROS ABS: 2.6 10*3/uL (ref 1.7–7.7)
Neutrophils Relative %: 60 %
PLATELETS: 104 10*3/uL — AB (ref 150–400)
RBC: 4.13 MIL/uL (ref 3.87–5.11)
RDW: 14.9 % (ref 11.5–15.5)
WBC: 4.4 10*3/uL (ref 4.0–10.5)

## 2017-03-09 LAB — URINALYSIS, ROUTINE W REFLEX MICROSCOPIC
BACTERIA UA: NONE SEEN
Bilirubin Urine: NEGATIVE
Glucose, UA: NEGATIVE mg/dL
Hgb urine dipstick: NEGATIVE
Ketones, ur: NEGATIVE mg/dL
Nitrite: NEGATIVE
Protein, ur: NEGATIVE mg/dL
SPECIFIC GRAVITY, URINE: 1.014 (ref 1.005–1.030)
SQUAMOUS EPITHELIAL / LPF: NONE SEEN
pH: 6 (ref 5.0–8.0)

## 2017-03-09 LAB — BASIC METABOLIC PANEL
ANION GAP: 5 (ref 5–15)
BUN: 38 mg/dL — ABNORMAL HIGH (ref 6–20)
CALCIUM: 8.1 mg/dL — AB (ref 8.9–10.3)
CO2: 27 mmol/L (ref 22–32)
CREATININE: 2.05 mg/dL — AB (ref 0.44–1.00)
Chloride: 108 mmol/L (ref 101–111)
GFR, EST AFRICAN AMERICAN: 24 mL/min — AB (ref >60)
GFR, EST NON AFRICAN AMERICAN: 20 mL/min — AB (ref >60)
GLUCOSE: 100 mg/dL — AB (ref 65–99)
POTASSIUM: 3.9 mmol/L (ref 3.5–5.1)
Sodium: 140 mmol/L (ref 135–145)

## 2017-03-09 LAB — LIPID PANEL
CHOL/HDL RATIO: 1.8 ratio
Cholesterol: 149 mg/dL (ref 0–200)
HDL: 84 mg/dL (ref >40)
LDL CALC: 52 mg/dL (ref 0–99)
Triglycerides: 63 mg/dL (ref <150)
VLDL: 13 mg/dL (ref 0–40)

## 2017-03-09 MED ORDER — LORATADINE 10 MG PO TABS
10.0000 mg | ORAL_TABLET | Freq: Every day | ORAL | Status: DC
  Administered 2017-03-09 – 2017-03-10 (×2): 10 mg via ORAL
  Filled 2017-03-09 (×2): qty 1

## 2017-03-09 MED ORDER — TORSEMIDE 20 MG PO TABS
20.0000 mg | ORAL_TABLET | Freq: Every day | ORAL | Status: DC
  Administered 2017-03-09 – 2017-03-10 (×2): 20 mg via ORAL
  Filled 2017-03-09 (×2): qty 1

## 2017-03-09 NOTE — Consult Note (Signed)
Cardiology Consult    Patient ID: [name] MRN: [id number], DOB/AGE: [birth year]   Admit date: 03/08/2017 Date of Consult: 03/09/2017  Primary Physician: [name], MD Primary Cardiologist: Dr. Lovena Le, Dr. Haroldine Laws Requesting Provider: Dr. Maudie Mercury  Reason for Consult: Chest pain  Patient Profile    [name] is a pleasant 83 yo female with a PMH significant symptomatic bradycardia s/p dual-chamber PPM, HTN, HLD, chest pain with nonobstructive CAD on cath (9485), and diastolic heart failure. She presented to [facility] with chest pain.  [name] is a 83 y.o. female who is being seen today for the evaluation of chest pain at the request of Dr. Maudie Mercury.   Past Medical History   Past Medical History:  Diagnosis Date  . Anemia   . Atrial fibrillation ([place])    a. Dx 01/2014->Eliquis started.  . Cancer ([place])   . Chest pain    a. 2011 Neg MV;  b. 01/2014 Cath: LM 20-30, LAD nl, D1 nl, LCX nl, OM1 nl, RCA dom 30-38m, PD/PL nl, EF 65%->Med Rx; c. 03/2016 MV: no ischemia/infarct. EF 63%.  . Degenerative joint disease   . Diastolic CHF, acute on chronic ([place]) 01/31/2012  . Dyslipidemia   . Dysrhythmia   . Family history of adverse reaction to anesthesia    " multiple family members have difficulty waking "  . GERD (gastroesophageal reflux disease)   . History of skin cancer    tafeen/ non melanoma.   Marland Kitchen Hx of varicella   . Hypertension   . Hypothyroidism   . Monoclonal gammopathies   . Pacemaker   . Palpitations    PVCs/bigeminy on event in May 2009 revealing this was relatively asymptomatic  . Right lower quadrant abdominal pain 07/19/2012   Recurrent  With nausea    This is new since she had ct for llq pain in [facility]    R/o appendiceal problem  Hernia  Get ct scan  And plan  Fu     . Thrombocytopenia ([place])     Past Surgical History:  Procedure Laterality Date  . CARDIOVERSION N/A 02/26/2014   Procedure: CARDIOVERSION AT BEDSIDE;  Surgeon: Pixie Casino, MD;   Location: [place];  Service: Cardiovascular;  Laterality: N/A;  . CARDIOVERSION N/A 04/22/2014   Procedure: CARDIOVERSION (BEDSIDE);  Surgeon: [name], MD;  [facility] OR;  Service: Cardiovascular;  Laterality: N/A;  . CATARACT EXTRACTION     Bilateral  implantt  . CESAREAN SECTION     times 2  . CHOLECYSTECTOMY  1999  . ESOPHAGOGASTRODUODENOSCOPY N/A 07/06/2014   Procedure: ESOPHAGOGASTRODUODENOSCOPY (EGD);  Surgeon: Ladene Artist, MD;  Location: [facility] ENDOSCOPY;  Service: Endoscopy;  Laterality: N/A;  . INSERT / REPLACE / REMOVE PACEMAKER    . LEFT HEART CATHETERIZATION WITH CORONARY ANGIOGRAM N/A 01/29/2014   Procedure: LEFT HEART CATHETERIZATION WITH CORONARY ANGIOGRAM;  Surgeon: Blane Ohara, MD;  Location: [facility] CATH LAB;  Service: Cardiovascular;  Laterality: N/A;  . PACEMAKER INSERTION  04/23/2014   STJ Assurity dual chamber pacemaker implanted by Dr Rayann Heman  . PERMANENT PACEMAKER INSERTION N/A 04/23/2014   Procedure: PERMANENT PACEMAKER INSERTION;  Surgeon: Evans Lance, MD;  Location: [facility] CATH LAB;  Service: Cardiovascular;  Laterality: N/A;  . RIGHT HEART CATHETERIZATION N/A 09/22/2014   Procedure: RIGHT HEART CATH;  Surgeon: Jolaine Artist, MD;  Location: [facility] C[facility] LAB;  Service: Cardiovascular;  Laterality: N/A;  . [place]   Right  Allergies  Allergies  Allergen Reactions  . Other Shortness Of Breath    Detergents, perfumes and other strong odor emitting compounds  . Tramadol Shortness Of Breath and Nausea Only  . Ibuprofen Other (See Comments)    Told not to take med    History of Present Illness    [name] is a 83 yo female with PMH significant for symptomatic sinus node dysfunction / sinus bradycardia s/p dual-chamber pacemaker (placed 04/23/14, [employer]), paroxysmal Afib on 2.5 mg eliquis BID and amiodarone. She was in sinus rhythm at her last clinic visit with Dr. Lovena Le on 10/25/16. She has a history of CAD with a negative myoview (2011,  2016, 2017), and nonobstructive disease by cath in 01/2014. She has hx of diastolic heart failure with grade 2 DD on recent echo. She also has a hx of HLD, HTN, and GERD.  [name] presented to the [facility] with complaints of substernal chest pain that was mild in intensity, did not radiate anywhere, and was relieved with positional changes. She states she has had chest pain "on and off for months and years."  When asked specifically about the current chest discomfort, she states it has been ongoing for a month and is often associated with sitting in the bed or sitting in a certain recliner to watch TV. She has a caregiver and is compliant on her medications. She does not appear to be in an acute CHF exacerbation and denies weight gain and LE edema. She reports sometimes having dyspnea on exertion when she sings while preparing breakfast. She does not think this pain is cardiac in nature and wishes to discharge home.  She has had recent ED visits for AMS, which self-resolved (01/27/17) and N/V and abdominal pain (11/18/16).    Inpatient Medications    . amiodarone  200 mg Oral Daily  . apixaban  2.5 mg Oral BID  . ferrous sulfate  325 mg Oral Daily  . levothyroxine  75 mcg Oral QAC breakfast  . loratadine  10 mg Oral Daily  . pantoprazole  40 mg Oral Daily  . potassium chloride SA  40 mEq Oral Daily  . simvastatin  10 mg Oral QHS  . torsemide  20 mg Oral Daily     Outpatient Medications    Prior to Admission medications   Medication Sig Start Date End Date Taking? Authorizing Provider  amiodarone (PACERONE) 200 MG tablet Take 1 tablet (200 mg total) by mouth daily. 11/25/16  Yes Jolaine Artist, MD  baclofen (LIORESAL) 10 MG tablet Take 10 mg by mouth 3 (three) times daily as needed for muscle spasms.    Yes Historical Provider, MD  denosumab (PROLIA) 60 MG/ML SOLN injection Inject 60 mg into the skin every 6 (six) months. Administer in upper arm, thigh, or abdomen   Yes Historical Provider,  MD  ELIQUIS 2.5 MG TABS tablet TAKE ONE TABLET BY MOUTH TWICE DAILY 10/18/16  Yes Jolaine Artist, MD  Ferrous Sulfate (IRON) 325 (65 Fe) MG TABS Take 1 tablet by mouth daily. 07/01/16  Yes Clanford Marisa Hua, MD  HYDROcodone-acetaminophen (NORCO/VICODIN) 5-325 MG tablet Take 1 tablet by mouth every 6 (six) hours as needed for moderate pain.  11/20/16  Yes Historical Provider, MD  levothyroxine (SYNTHROID, LEVOTHROID) 75 MCG tablet TAKE ONE TABLET BY MOUTH ONCE DAILY. 01/24/17  Yes Burnis Medin, MD  meclizine (ANTIVERT) 12.5 MG tablet Take 1 tablet (12.5 mg total) by mouth 2 (two) times daily as needed for  dizziness. 09/14/16  Yes Jolaine Artist, MD  metolazone (ZAROXOLYN) 2.5 MG tablet Take 1 tablet (2.5 mg total) by mouth as needed (for weight 134 lb or greater). 03/09/16  Yes Jolaine Artist, MD  nitroGLYCERIN (NITROSTAT) 0.4 MG SL tablet Place 1 tablet (0.4 mg total) under the tongue every 5 (five) minutes as needed. For chest pain 05/06/16  Yes Jolaine Artist, MD  pantoprazole (PROTONIX) 40 MG tablet Take 40 mg by mouth daily.   Yes Historical Provider, MD  potassium chloride SA (K-DUR,KLOR-CON) 20 MEQ tablet Take 2 tablets (40 mEq total) by mouth daily. Take an additional 45meq with Metolazone. 10/04/16  Yes Jolaine Artist, MD  senna (SENOKOT) 8.6 MG TABS tablet Take 2 tablets (17.2 mg total) by mouth at bedtime. Patient taking differently: Take 2 tablets by mouth at bedtime as needed for mild constipation.  11/13/16  Yes Doreatha Lew, MD  simvastatin (ZOCOR) 10 MG tablet TAKE ONE TABLET BY MOUTH AT BEDTIME 02/14/17  Yes Larey Dresser, MD  torsemide (DEMADEX) 20 MG tablet Take 2 tablets (40 mg total) by mouth daily. Please start on 11/22/2016 11/22/16  Yes Albertine Patricia, MD     Family History    Family History  Problem Relation Age of Onset  . Coronary artery disease Father   . Heart disease Father   . Heart attack Father   . Alzheimer's disease Mother   . Lung  cancer Brother 25  . Lung cancer      Social History    Social History   Social History  . Marital status: Widowed    Spouse name: N/A  . Number of children: 3  . Years of education: N/A   Occupational History  .  Retired    [employer], book keeping   Social History Main Topics  . Smoking status: Never Smoker  . Smokeless tobacco: Never Used  . Alcohol use No  . Drug use: No  . Sexual activity: No   Other Topics Concern  . 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 Eats with her.      Daughterrenee handles medications. Sees HH and PT once a week.   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 She reports recent weight loss that coincides with decreased PO intake.  Cardiovascular:  Positive chest pain, occasional dyspnea on exertion, No edema, orthopnea, palpitations, paroxysmal nocturnal dyspnea. Dermatological: No rash, lesions/masses Respiratory: No cough, dyspnea Urologic: No hematuria, dysuria Abdominal:   No nausea, vomiting, diarrhea, bright red blood per rectum, melena, or hematemesis Neurologic:  No visual changes, changes in mental status. All other systems reviewed and are otherwise negative except as noted above.  Physical Exam    Blood pressure 110/60, pulse 71, temperature 97.4 F (36.3 C), temperature source Oral, resp. rate 18, height 4\' 10"  (1.473 m), weight 125 lb 1.6 oz (56.7 kg), SpO2 97 %.  General: Pleasant, NAD Psych: Normal affect. Neuro: Alert and oriented X 3. Moves all extremities spontaneously. HEENT: Normal  Neck: Supple without bruits or  JVD. Lungs:  Resp regular and unlabored, CTA. Heart: RRR 3/6 systolic murmur Abdomen: Soft, non-tender,  non-distended, BS + x 4.  Extremities: No clubbing, cyanosis. Trace edema. DP/PT/Radials 1+ and equal bilaterally.  Labs    Troponin [facility] of Care Test)  Recent Labs  03/08/17 1958  TROPIPOC 0.02    Recent Labs  03/08/17 2332 03/09/17 0223 03/09/17 0429 03/09/17 1003  TROPONINI 0.03* 0.03* 0.04* 0.03*   Lab Results  Component Value Date   WBC 4.4 03/09/2017   HGB 12.6 03/09/2017   HCT 39.9 03/09/2017   MCV 96.6 03/09/2017   PLT 104 (L) 03/09/2017    Recent Labs Lab 03/09/17 0429  NA 140  K 3.9  CL 108  CO2 27  BUN 38*  CREATININE 2.05*  CALCIUM 8.1*  GLUCOSE 100*   Lab Results  Component Value Date   CHOL 149 03/09/2017   HDL 84 03/09/2017   LDLCALC 52 03/09/2017   TRIG 63 03/09/2017   No results found for: [facility]   Radiology Studies    Dg Chest 2 View  Result Date: 03/08/2017 CLINICAL DATA:  Chest pain EXAM: CHEST  2 VIEW COMPARISON:  01/27/2017 FINDINGS: Cardiac shadow is stable. Pacing device is again seen stable perihilar well-aerated bilaterally through mild atelectatic changes the left base is again identified. No sizable infiltrate is noted. Degenerative changes of the thoracic spine are seen. IMPRESSION: Stable left basilar atelectasis. Electronically Signed   By: Inez Catalina M.D.   On: 03/08/2017 19:59    ECG & Cardiac Imaging    EKG 03/09/17: Ventricular paced rhythm   Echocardiogram 03/09/17: pending   Myoview 04/01/16: 1. No reversible ischemia or infarction. 2. Normal left ventricular wall motion. 3. Left ventricular ejection fraction 63% 4. Non invasive risk stratification*: Low   Echocardiogram 01/22/16: Study Conclusions - Left ventricle: The cavity size was normal. Systolic function was   normal. The estimated ejection fraction was in the range of 60%   to 65%. Wall motion was normal; there were no regional wall   motion abnormalities. Features are consistent with a pseudonormal   left ventricular filling pattern, with  concomitant abnormal   relaxation and increased filling pressure (grade 2 diastolic   dysfunction). Doppler parameters are consistent with elevated   ventricular end-diastolic filling pressure. - Aortic valve: Trileaflet; mildly thickened, mildly calcified   leaflets. There was mild stenosis. - Aortic root: The aortic root was normal in size. - Mitral valve: There was severe regurgitation. - Left atrium: The atrium was severely dilated. - Right ventricle: Pacer wire or catheter noted in right ventricle.   Systolic function was normal. - Right atrium: The atrium was moderately dilated. - Tricuspid valve: There was severe regurgitation. - Pulmonic valve: There was mild regurgitation. - Pulmonary arteries: Systolic pressure was moderately increased.   PA peak pressure: 49 mm Hg (S). - Line: A venous catheter was visualized in the superior vena cava,   with its tip in the right atrium. No abnormal features noted. - Inferior vena cava: The vessel was normal in size. - Pericardium, extracardiac: There was no pericardial effusion.   Myoview 03/31/15: 1. No scintigraphic evidence of prior infarction or pharmacologically induced ischemia. 2. Normal left ventricular wall motion. 3. Left ventricular ejection fraction 73% 4. Low-risk stress test findings*.   Heart Catheterization 01/29/14: 1. Diffuse nonobstructive coronary artery disease 2. Normal left ventricular systolic function Recommendations: Medical therapy for nonobstructive CAD.   Assessment & Plan    1. Chest pain -  troponin 0.03 --> 0.03 --> 0.04 --> 0.03 - EKG without clear signs of ischemia Given her cardiac enzymes, EKG, and atypical symptoms, this chest pain is likely not cardiac in nature.  Heart Cath in 2015 with nonobstuctive disease resulting in medical management. Will discuss with attending.    2. Chronic diastolic heart failure - Weight at last ED visit when she was found to be euvolemic was 119 lbs. Weight on  admission was 125 lbs. Would recommend resuming her home diuretic regimen of 40 mg torsemide daily (only scheduled to receive 20 mg daily now). She is not severely overloaded on exam but does have trace edema.    3. Acute on CKD stage IV - sCr 2.05 (1.87 on admission), baseline appears to be 1.61 - 1.95 - per primary team, continue diuretics   4. Hx of paroxysmal Afib - in NSR, continue eliquis and amiodarone    Signed, Ledora Bottcher, PA-C 03/09/2017, 2:04 PM [phone]  History and all data above reviewed.  Patient examined.  I agree with the findings as above.  The patient presents with epigastric pain that has very atypical features.  She has had non obstructive CAD in the past.  She does have a history of tachy and brady arrhythmias and has a ventricular paced rhythm on EKG.  She has a history of severe TR and MR.   Enzymes have been borderline but flat and not diagnostic of ACS.  The patient exam reveals COR:RRR, apical holosystolic murmur, no diastolic murmurs ,  Lungs: Clear  ,  Abd: Positive bowel sounds, no rebound no guarding, Ext No edema  .  All available labs, radiology testing, previous records reviewed. Agree with documented assessment and plan. Chest pain:  This is atypical .  No objective evidence of ischemia.  No further invasive or non invasive evaluation at this point.  MR/TR:  OK to check an echo.   [name]  4:19 PM  03/09/2017

## 2017-03-09 NOTE — Progress Notes (Signed)
Patient ID: [name], female   DOB: Mar 17, 1925, 83 y.o.   MRN: [id number]                                                                PROGRESS NOTE                                                                                                                                                                                                             Patient Demographics:    [name], is a 83 y.o. female, DOB - [birth year], YQI:[id number]  Admit date - 03/08/2017   Admitting Physician Norval Morton, MD  Outpatient Primary MD for the patient is [name], MD  LOS - 0  Outpatient Specialists:  Dr. Missy Sabins  (cardiology)  Chief Complaint  Patient presents with  . Chest Pain       Brief Narrative  83 y.o. female with medical history significant of HTN, HLD, paroxysmal atrial fibrillation s/p PPM on Eliquis, diastolic heart failure, hypothyroidism, thrombocytopenia, CKD stage IV, and MGUS; who presents with complaints of chest discomfort since 3 PM. Patient reports having discomfort substernally that feels like someone is placing dry ice on her chest. Symptoms have been constant and associated symptoms of increased lethargyand reported weight loss of 7 pounds in the last month or 2. She reports falling asleep multiple times today and symptoms persisting. Patient denies any significant shortness of breath, diaphoresis, nausea, vomiting, fever, chills, or dysuria. Previously, evaluated by her PCP for similar chest discomfort that she thinks may be related to her pacemaker, but states that. She has a follow up appointment with her cardiologist Dr. Jearld Pies in June.   ED Course: Upon admission into the emergency department patient was seen to be afebrile with blood pressure noted to be as low as 83/52.blood pressures improved after 250 mL bolus. Lab work revealed BUN 41, creatinine 2.18. EKG showed no acute ischemic changes and troponin was noted to be negative.   Subjective:    [name] today states that cp started 3pm yesterday.  sscp,  " heaviness"  Pt staets that the pain lasted until came to 1 am,  No radiation of the pain . Pt denies cp presently.    No headache, No abdominal pain - No Nausea, No new weakness tingling  or numbness, No Cough - SOB.    Assessment  & Plan :    Active Problems:   Hypothyroidism   Thrombocytopenia (HCC)   Atrial fibrillation (HCC)   Chronic diastolic CHF (congestive heart failure) (HCC)   Chronic anticoagulation   Pacemaker   Acute kidney injury superimposed on chronic kidney disease ([place])   Chest pain   Chest pain: Acute. Patient reports being followed by Dr. Jearld Pies of cardiology. Heart score = 5. -  Admit to telemetry bed - cycle CE q 3 hr x3 and repeat EKG in the am  - Nitroglycerin prn, aspirin - Risk factor stratification: will check FLP  - 2d echo Cardiology consulted,  Appreciate input   Acute kidney injury on chronic kidney disease stage IV: patient's baseline creatinine had previously been 1.87 with BUN 44 the previous day. On admission and creatinine noted to be 2.18 and BUN 41. Review of records shows patient had issues with dehydration in the past. - NS IVF at 75 ml/hr, will discontinue  Atrial fibrillation on chronic anticoagulation with PPM: Chronic. Currently rate controlled. chadvasc score 5 - Continue home medication amiodarone and eliquis  Chronic diastolic heart failure: Stable. Echo done March 2017 with and EF of 53% grade 2 diastolic - strict I&Os - restart on torsemide  Hypothyroidism: TSH noted to be 3.28 on 4/16. - continue levothyroxine  Thrombocytopenia: chronic. Patient presents with a platelet count 121. No active source of bleeding noted. - continue to monitor  Dyslipidemia - Continue simvastatin  GERD - Continue Protonix  DVT prophylaxis: Eliquis Code Status: Full  Family Communication: No family present at bedside Disposition Plan: Likely  discharge home   Consults called: none Admission status: Observation  Lab Results  Component Value Date   PLT 104 (L) 03/09/2017    Antibiotics  :    Anti-infectives    None        Objective:   Vitals:   03/08/17 2300 03/09/17 0000 03/09/17 0118 03/09/17 0536  BP: (!) 111/54 113/80 107/62 (!) 102/59  Pulse: 70 72 71 70  Resp: 14 16 18 18   Temp:   97.4 F (36.3 C) 97.8 F (36.6 C)  TempSrc:   Oral Oral  SpO2: 98% 98% 100% 97%  Weight:   56.7 kg (125 lb 1.6 oz)   Height:   4\' 10"  (1.473 m)     Wt Readings from Last 3 Encounters:  03/09/17 56.7 kg (125 lb 1.6 oz)  02/27/17 54 kg (119 lb)  01/27/17 54 kg (119 lb)     Intake/Output Summary (Last 24 hours) at 03/09/17 0731 Last data filed at 03/09/17 0622  Gross per 24 hour  Intake            552.5 ml  Output              300 ml  Net            252.5 ml     Physical Exam  Awake Alert, Oriented X 3, No new F.N deficits, Normal affect [place].AT,PERRAL Supple Neck,No JVD, No cervical lymphadenopathy appriciated.  Symmetrical Chest wall movement, Good air movement bilaterally, CTAB RRR,No Gallops,Rubs or new Murmurs, No Parasternal Heave +ve B.Sounds, Abd Soft, No tenderness, No organomegaly appriciated, No rebound - guarding or rigidity. No Cyanosis, Clubbing or edema, No new Rash or bruise + varicose veins Pacer on right upper chest    Data Review:    CBC  Recent Labs Lab 03/07/17 1404 03/08/17 1942 03/09/17 0429  WBC 5.4 4.6 4.4  HGB 14.4 13.5 12.6  HCT 43.6 41.8 39.9  PLT 126.0* 121* 104*  MCV 94.7 96.1 96.6  MCH  --  31.0 30.5  MCHC 33.2 32.3 31.6  RDW 15.3 15.1 14.9  LYMPHSABS 0.7  --  1.3  MONOABS 0.5  --  0.4  EOSABS 0.0  --  0.1  BASOSABS 0.1  --  0.0    Chemistries   Recent Labs Lab 03/07/17 1404 03/08/17 1942 03/09/17 0429  NA 142 139 140  K 4.2 4.3 3.9  CL 104 107 108  CO2 32 26 27  GLUCOSE 103* 107* 100*  BUN 44* 41* 38*  CREATININE 1.87* 2.18* 2.05*  CALCIUM 9.5 8.7*  8.1*   ------------------------------------------------------------------------------------------------------------------  Recent Labs  03/09/17 0429  CHOL 149  HDL 84  LDLCALC 52  TRIG 63  CHOLHDL 1.8    No results found for: HGBA1C ------------------------------------------------------------------------------------------------------------------ No results for input(s): TSH, T4TOTAL, T3FREE, THYROIDAB in the last 72 hours.  Invalid input(s): FREET3 ------------------------------------------------------------------------------------------------------------------ No results for input(s): VITAMINB12, FOLATE, FERRITIN, TIBC, IRON, RETICCTPCT in the last 72 hours.  Coagulation profile  Recent Labs Lab 03/08/17 1942  INR 1.23    No results for input(s): DDIMER in the last 72 hours.  Cardiac Enzymes  Recent Labs Lab 03/08/17 2332 03/09/17 0223 03/09/17 0429  TROPONINI 0.03* 0.03* 0.04*   ------------------------------------------------------------------------------------------------------------------    Component Value Date/Time   BNP 264.3 (H) 11/11/2016 1907    Inpatient Medications  Scheduled Meds: . amiodarone  200 mg Oral Daily  . apixaban  2.5 mg Oral BID  . ferrous sulfate  325 mg Oral Daily  . levothyroxine  75 mcg Oral QAC breakfast  . pantoprazole  40 mg Oral Daily  . potassium chloride SA  40 mEq Oral Daily  . simvastatin  10 mg Oral QHS   Continuous Infusions: . sodium chloride 75 mL/hr at 03/09/17 0152   PRN Meds:.acetaminophen, baclofen, gi cocktail, HYDROcodone-acetaminophen, meclizine, nitroGLYCERIN, ondansetron (ZOFRAN) IV, senna  Micro Results Recent Results (from the past 240 hour(s))  Urine culture     Status: None   Collection Time: 02/27/17  4:30 PM  Result Value Ref Range Status   Organism ID, Bacteria NO GROWTH  Final    Radiology Reports Dg Chest 2 View  Result Date: 03/08/2017 CLINICAL DATA:  Chest pain EXAM: CHEST  2 VIEW  COMPARISON:  01/27/2017 FINDINGS: Cardiac shadow is stable. Pacing device is again seen stable perihilar well-aerated bilaterally through mild atelectatic changes the left base is again identified. No sizable infiltrate is noted. Degenerative changes of the thoracic spine are seen. IMPRESSION: Stable left basilar atelectasis. Electronically Signed   By: Inez Catalina M.D.   On: 03/08/2017 19:59    Time Spent in minutes  30   Jani Gravel M.D on 03/09/2017 at 7:31 AM  Between 7am to 7pm - Pager - [phone]  After 7pm go to [email or web address] - password [facility]  [facility] -  Office  [phone]

## 2017-03-09 NOTE — Care Management Obs Status (Signed)
[place] NOTIFICATION   Patient Details  Name: [name] MRN: [id number] Date of Birth: 08-29-1925   Medicare Observation Status Notification Given:  Yes    Dawayne Patricia, RN 03/09/2017, 2:13 PM

## 2017-03-10 ENCOUNTER — Other Ambulatory Visit: Payer: Self-pay

## 2017-03-10 ENCOUNTER — Observation Stay (HOSPITAL_BASED_OUTPATIENT_CLINIC_OR_DEPARTMENT_OTHER): Payer: Medicare Other

## 2017-03-10 DIAGNOSIS — I130 Hypertensive heart and chronic kidney disease with heart failure and stage 1 through stage 4 chronic kidney disease, or unspecified chronic kidney disease: Secondary | ICD-10-CM | POA: Diagnosis not present

## 2017-03-10 DIAGNOSIS — I050 Rheumatic mitral stenosis: Secondary | ICD-10-CM | POA: Diagnosis present

## 2017-03-10 DIAGNOSIS — I340 Nonrheumatic mitral (valve) insufficiency: Secondary | ICD-10-CM | POA: Diagnosis present

## 2017-03-10 DIAGNOSIS — R079 Chest pain, unspecified: Secondary | ICD-10-CM | POA: Diagnosis not present

## 2017-03-10 DIAGNOSIS — N179 Acute kidney failure, unspecified: Secondary | ICD-10-CM | POA: Diagnosis not present

## 2017-03-10 DIAGNOSIS — E039 Hypothyroidism, unspecified: Secondary | ICD-10-CM | POA: Diagnosis not present

## 2017-03-10 DIAGNOSIS — N189 Chronic kidney disease, unspecified: Secondary | ICD-10-CM | POA: Diagnosis not present

## 2017-03-10 DIAGNOSIS — D696 Thrombocytopenia, unspecified: Secondary | ICD-10-CM | POA: Diagnosis not present

## 2017-03-10 LAB — COMPREHENSIVE METABOLIC PANEL
ALBUMIN: 3.2 g/dL — AB (ref 3.5–5.0)
ALT: 19 U/L (ref 14–54)
AST: 20 U/L (ref 15–41)
Alkaline Phosphatase: 53 U/L (ref 38–126)
Anion gap: 7 (ref 5–15)
BUN: 32 mg/dL — AB (ref 6–20)
CHLORIDE: 108 mmol/L (ref 101–111)
CO2: 24 mmol/L (ref 22–32)
Calcium: 8.2 mg/dL — ABNORMAL LOW (ref 8.9–10.3)
Creatinine, Ser: 1.89 mg/dL — ABNORMAL HIGH (ref 0.44–1.00)
GFR calc Af Amer: 26 mL/min — ABNORMAL LOW (ref >60)
GFR calc non Af Amer: 22 mL/min — ABNORMAL LOW (ref >60)
GLUCOSE: 90 mg/dL (ref 65–99)
POTASSIUM: 4.3 mmol/L (ref 3.5–5.1)
Sodium: 139 mmol/L (ref 135–145)
Total Bilirubin: 0.8 mg/dL (ref 0.3–1.2)
Total Protein: 5.8 g/dL — ABNORMAL LOW (ref 6.5–8.1)

## 2017-03-10 LAB — CBC
HEMATOCRIT: 40.8 % (ref 36.0–46.0)
Hemoglobin: 13.2 g/dL (ref 12.0–15.0)
MCH: 31.2 pg (ref 26.0–34.0)
MCHC: 32.4 g/dL (ref 30.0–36.0)
MCV: 96.5 fL (ref 78.0–100.0)
Platelets: 98 10*3/uL — ABNORMAL LOW (ref 150–400)
RBC: 4.23 MIL/uL (ref 3.87–5.11)
RDW: 15.1 % (ref 11.5–15.5)
WBC: 4.6 10*3/uL (ref 4.0–10.5)

## 2017-03-10 MED ORDER — LORATADINE 10 MG PO TABS: 10.0000 mg | ORAL_TABLET | Freq: Every day | ORAL | 0 refills | Status: AC

## 2017-03-10 NOTE — Progress Notes (Signed)
Progress Note  Patient Name: [name] Date of Encounter: 03/10/2017  Primary Cardiologist:   Dr. Lovena Le, Dr. Haroldine Laws  Subjective   She did report pain again this morning.  It is difficult to assess clear details.  She says that it is a sharp pain that is left upper quadrant and sub xyphoid.   With demand ventriclar pacing and no obvious ST changes.  She is currently pain free.   Inpatient Medications    Scheduled Meds: . amiodarone  200 mg Oral Daily  . apixaban  2.5 mg Oral BID  . ferrous sulfate  325 mg Oral Daily  . levothyroxine  75 mcg Oral QAC breakfast  . loratadine  10 mg Oral Daily  . pantoprazole  40 mg Oral Daily  . potassium chloride SA  40 mEq Oral Daily  . simvastatin  10 mg Oral QHS  . torsemide  20 mg Oral Daily   Continuous Infusions:  PRN Meds: acetaminophen, baclofen, gi cocktail, HYDROcodone-acetaminophen, meclizine, nitroGLYCERIN, ondansetron (ZOFRAN) IV, senna   Vital Signs    Vitals:   03/09/17 2021 03/10/17 0327 03/10/17 0500 03/10/17 0718  BP: (!) 101/54 (!) 95/52  (!) 122/58  Pulse: 76 73  70  Resp: 18 18  18   Temp: 97.7 F (36.5 C) 97.7 F (36.5 C)  97.7 F (36.5 C)  TempSrc: Oral Oral  Oral  SpO2: 98% 97%  94%  Weight:   124 lb 14.4 oz (56.7 kg)   Height:        Intake/Output Summary (Last 24 hours) at 03/10/17 0755 Last data filed at 03/09/17 2150  Gross per 24 hour  Intake              360 ml  Output                0 ml  Net              360 ml   Filed Weights   03/09/17 0118 03/10/17 0500  Weight: 125 lb 1.6 oz (56.7 kg) 124 lb 14.4 oz (56.7 kg)    Telemetry    Irregular rhythm with demand pacing - Personally Reviewed  ECG    NSR with demand pacing.  Possible PAF as well.  No acute ST T wave changes - Personally Reviewed  Physical Exam   GEN: No acute distress.   Neck: No  JVD Cardiac: IrregularRR, 3/6 apical systolic murmur radiating at the apex, no diastolic murmurs, rubs, or gallops.  Respiratory:  Clear  to auscultation bilaterally. GI: Soft, nontender, non-distended  MS: No  edema; No deformity. Neuro:  Nonfocal.  Seems to be slightly confused Psych: Normal affect   Labs    Chemistry Recent Labs Lab 03/08/17 1942 03/09/17 0429 03/10/17 0432  NA 139 140 139  K 4.3 3.9 4.3  CL 107 108 108  CO2 26 27 24   GLUCOSE 107* 100* 90  BUN 41* 38* 32*  CREATININE 2.18* 2.05* 1.89*  CALCIUM 8.7* 8.1* 8.2*  PROT  --   --  5.8*  ALBUMIN  --   --  3.2*  AST  --   --  20  ALT  --   --  19  ALKPHOS  --   --  53  BILITOT  --   --  0.8  GFRNONAA 19* 20* 22*  GFRAA 22* 24* 26*  ANIONGAP 6 5 7      Hematology Recent Labs Lab 03/08/17 1942 03/09/17 3810 03/10/17 1751  WBC 4.6 4.4 4.6  RBC 4.35 4.13 4.23  HGB 13.5 12.6 13.2  HCT 41.8 39.9 40.8  MCV 96.1 96.6 96.5  MCH 31.0 30.5 31.2  MCHC 32.3 31.6 32.4  RDW 15.1 14.9 15.1  PLT 121* 104* 98*    Cardiac Enzymes Recent Labs Lab 03/08/17 2332 03/09/17 0223 03/09/17 0429 03/09/17 1003  TROPONINI 0.03* 0.03* 0.04* 0.03*    Recent Labs Lab 03/08/17 1958  TROPIPOC 0.02     BNPNo results for input(s): BNP, PROBNP in the last 168 hours.   DDimer No results for input(s): DDIMER in the last 168 hours.   Radiology    Dg Chest 2 View  Result Date: 03/08/2017 CLINICAL DATA:  Chest pain EXAM: CHEST  2 VIEW COMPARISON:  01/27/2017 FINDINGS: Cardiac shadow is stable. Pacing device is again seen stable perihilar well-aerated bilaterally through mild atelectatic changes the left base is again identified. No sizable infiltrate is noted. Degenerative changes of the thoracic spine are seen. IMPRESSION: Stable left basilar atelectasis. Electronically Signed   By: Inez Catalina M.D.   On: 03/08/2017 19:59    Cardiac Studies   NA  Patient Profile     83 y.o. female with a PMH significant symptomatic bradycardia s/p dual-chamber PPM, HTN, HLD, chest pain with nonobstructive CAD on cath (2536), and diastolic heart failure. She  presented to [facility] with chest pain.  Assessment & Plan    CHEST PAIN:    Troponin trend flat.  She continues to have pain but it is lower sub xyphoid and upper left quadrant sharp and shooting.  There is no objective evidence of ischemia.  She had a negative Lexiscan Myoview less than one hear ago.  She is having an echocardiogram and I will review these results.   At this point no further cardiac work up is planned.  Consider other non anginal etiologies for her continued pain.   ATRIAL FIB:    On chronic Eliquis.  No further work up.     Signed, Minus Breeding, MD  03/10/2017, 7:55 AM

## 2017-03-10 NOTE — Progress Notes (Signed)
  Echocardiogram 2D Echocardiogram has been performed.  Jary Louvier T Daril Warga 03/10/2017, 9:01 AM

## 2017-03-10 NOTE — Discharge Summary (Signed)
[name], is a 83 y.o. female  DOB [birth year]  MRN [id number].  Admission date:  03/08/2017  Admitting Physician  Norval Morton, MD  Discharge Date:  03/10/2017   Primary MD  [name], MD  Recommendations for primary care physician for things to follow:    Chest pain: Acute. Patient reports being followed by Dr. Missy Sabins ofcardiology. Heart score = 5. - trop I 0.03 x3 Cardiology consulted and thought to be atypical chest pain as she was pointing to epigastric area.  Echo =, no change Please f/u with cardiology in 1-2 weeks  Acute kidney injury on chronic kidney disease stage IV: patient's baseline creatinine had previously been 1.87 with BUN 44 the previous day. On admission and creatinine noted to be 2.18 and BUN 41. Pt was gently hydrated and this resolved.  Check bmp in am  Atrial fibrillation on chronic anticoagulation with PPM: Chronic. Currently rate controlled. chadvasc score 5 Continue home medication amiodarone and eliquis  Chronic diastolic heart failure: Stable. Echo done March 2017 with and EF of 40% grade 2 diastolic Cont  on torsemide  Hypothyroidism: TSH noted to be 3.28on 4/16. continue levothyroxine  Thrombocytopenia: chronic. Patient presents with a platelet count 121. No active source of bleeding noted. Continue to monitor  Dyslipidemia Continue simvastatin  GERD (CT scan 10/2016 unremarkable) Continue protonix,  if epigastric, discomfort recurs consider increase in protonix, and GI consultation as outpatient Please f/u with PCP regarding this matter  Admission Diagnosis  Chest pain, unspecified type [R07.9]   Discharge Diagnosis  Chest pain, unspecified type [R07.9]    Principal Problem:   Chest pain Active Problems:   Hypothyroidism   Thrombocytopenia (HCC)   Atrial fibrillation (HCC)   Chronic diastolic CHF (congestive heart  failure) ([place])   Chronic anticoagulation   Pacemaker   Acute kidney injury superimposed on chronic kidney disease ([place])   Mitral regurgitation   Mitral stenosis      Past Medical History:  Diagnosis Date  . Anemia   . Atrial fibrillation ([place])    a. Dx 01/2014->Eliquis started.  . Cancer ([place])   . Chest pain    a. 2011 Neg MV;  b. 01/2014 Cath: LM 20-30, LAD nl, D1 nl, LCX nl, OM1 nl, RCA dom 30-43m, PD/PL nl, EF 65%->Med Rx; c. 03/2016 MV: no ischemia/infarct. EF 63%.  . Degenerative joint disease   . Diastolic CHF, acute on chronic ([place]) 01/31/2012  . Dyslipidemia   . Dysrhythmia   . Family history of adverse reaction to anesthesia    " multiple family members have difficulty waking "  . GERD (gastroesophageal reflux disease)   . History of skin cancer    tafeen/ non melanoma.   Marland Kitchen Hx of varicella   . Hypertension   . Hypothyroidism   . Monoclonal gammopathies   . Pacemaker   . Palpitations    PVCs/bigeminy on event in May 2009 revealing this was relatively asymptomatic  . Right lower quadrant abdominal pain 07/19/2012  Recurrent  With nausea    This is new since she had ct for llq pain in [facility]    R/o appendiceal problem  Hernia  Get ct scan  And plan  Fu     . Thrombocytopenia ([place])     Past Surgical History:  Procedure Laterality Date  . CARDIOVERSION N/A 02/26/2014   Procedure: CARDIOVERSION AT BEDSIDE;  Surgeon: Pixie Casino, MD;  Location: [place];  Service: Cardiovascular;  Laterality: N/A;  . CARDIOVERSION N/A 04/22/2014   Procedure: CARDIOVERSION (BEDSIDE);  Surgeon: [name], MD;  Location: [facility] OR;  Service: Cardiovascular;  Laterality: N/A;  . CATARACT EXTRACTION     Bilateral  implantt  . CESAREAN SECTION     times 2  . CHOLECYSTECTOMY  1999  . ESOPHAGOGASTRODUODENOSCOPY N/A 07/06/2014   Procedure: ESOPHAGOGASTRODUODENOSCOPY (EGD);  Surgeon: Ladene Artist, MD;  Location: [facility] ENDOSCOPY;  Service: Endoscopy;  Laterality: N/A;  . INSERT / REPLACE / REMOVE  PACEMAKER    . LEFT HEART CATHETERIZATION WITH CORONARY ANGIOGRAM N/A 01/29/2014   Procedure: LEFT HEART CATHETERIZATION WITH CORONARY ANGIOGRAM;  Surgeon: Blane Ohara, MD;  Location: [facility] CATH LAB;  Service: Cardiovascular;  Laterality: N/A;  . PACEMAKER INSERTION  04/23/2014   STJ Assurity dual chamber pacemaker implanted by Dr Rayann Heman  . PERMANENT PACEMAKER INSERTION N/A 04/23/2014   Procedure: PERMANENT PACEMAKER INSERTION;  Surgeon: Evans Lance, MD;  Location: [facility] CATH LAB;  Service: Cardiovascular;  Laterality: N/A;  . RIGHT HEART CATHETERIZATION N/A 09/22/2014   Procedure: RIGHT HEART CATH;  Surgeon: Jolaine Artist, MD;  Location: [facility] CATH LAB;  Service: Cardiovascular;  Laterality: N/A;  . SHOULDER SURGERY  1996   Right        HPI  from the history and physical done on the day of admission:      83 y.o. female with medical history significant of HTN, HLD, paroxysmal atrial fibrillation s/p PPM on Eliquis, diastolic heart failure, hypothyroidism, thrombocytopenia, CKD stage IV, and MGUS; who presents with complaints of chest discomfort since 3 PM. Patient reports having discomfort substernally that feels like someone is placing dry ice on her chest. Symptoms have been constant and associated symptoms of increased lethargyand reported weight loss of 7 pounds in the last month or 2. She reports falling asleep multiple times today and symptoms persisting. Patient denies any significant shortness of breath, diaphoresis, nausea, vomiting, fever, chills, or dysuria. Previously, evaluated by her PCP for similar chest discomfort that she thinks may be related to her pacemaker, but states that. She has a follow up appointment with her cardiologist Dr. Jearld Pies in June.   ED Course: Upon admission into the emergency department patient was seen to be afebrile with blood pressure noted to be as low as 83/52.blood pressures improved after 250 mL bolus. Lab work revealed BUN 41, creatinine 2.18. EKG  showed no acute ischemic changes and troponin was noted to be negative.    Hospital Course:     Pt was admitted trop cycled and were 0.03 =>0.04=>0.03.  Cardiology consulted (Dr. Minus Breeding) and thought that the chest pain was nonischemic in nature.   Pt has had prior stress test 04/01/2016 => low risk (see below), and prior cardiac cath 01/29/2014=> nonobstructive cad (see below) .    Cardiology agreed with checking cardiac echo 4/26=> no significant change .  Due to mild renal insufficiency she was hydrated gently overnite on admission, and fluids were then discontinued.  Torsemide held on day1, and then  restarted at 20mg  po qday on day2, and will be resuming regular dose of 40mg  po qday.  Pt had some epigastric discomfort earlier today but now resolved and appears stable and will be discharged to home.    See Below prior cardiac related studies=>    EKG 03/09/17: Ventricular paced rhythm   Echocardiogram 03/09/17: pending   Myoview 04/01/16: 1. No reversible ischemia or infarction. 2. Normal left ventricular wall motion. 3. Left ventricular ejection fraction 63% 4. Non invasive risk stratification*: Low   Echocardiogram 01/22/16: Study Conclusions - Left ventricle: The cavity size was normal. Systolic function was normal. The estimated ejection fraction was in the range of 60% to 65%. Wall motion was normal; there were no regional wall motion abnormalities. Features are consistent with a pseudonormal left ventricular filling pattern, with concomitant abnormal relaxation and increased filling pressure (grade 2 diastolic dysfunction). Doppler parameters are consistent with elevated ventricular end-diastolic filling pressure. - Aortic valve: Trileaflet; mildly thickened, mildly calcified leaflets. There was mild stenosis. - Aortic root: The aortic root was normal in size. - Mitral valve: There was severe regurgitation. - Left atrium: The atrium was severely  dilated. - Right ventricle: Pacer wire or catheter noted in right ventricle. Systolic function was normal. - Right atrium: The atrium was moderately dilated. - Tricuspid valve: There was severe regurgitation. - Pulmonic valve: There was mild regurgitation. - Pulmonary arteries: Systolic pressure was moderately increased. PA peak pressure: 49 mm Hg (S). - Line: A venous catheter was visualized in the superior vena cava, with its tip in the right atrium. No abnormal features noted. - Inferior vena cava: The vessel was normal in size. - Pericardium, extracardiac: There was no pericardial effusion.   Myoview 03/31/15: 1. No scintigraphic evidence of prior infarction or pharmacologically induced ischemia. 2. Normal left ventricular wall motion. 3. Left ventricular ejection fraction 73% 4. Low-risk stress test findings*.   Heart Catheterization 01/29/14: 1. Diffuse nonobstructive coronary artery disease 2. Normal left ventricular systolic function Recommendations: Medical therapy for nonobstructive CAD.   Follow UP  Follow-up Information    [name], MD Follow up in 1 week(s).   Specialties:  Internal Medicine, Pediatrics Contact information: [place] [address] [phone]        Glori Bickers, MD Follow up in 1 week(s).   Specialty:  Cardiology Contact information: [address] [place] [address] [phone]            Consults obtained - cardiology  Discharge Condition: stable  Diet and Activity recommendation: See Discharge Instructions below  Discharge Instructions         Discharge Medications     Allergies as of 03/10/2017      Reactions   Other Shortness Of Breath   Detergents, perfumes and other strong odor emitting compounds   Tramadol Shortness Of Breath, Nausea Only   Ibuprofen Other (See Comments)   Told not to take med      Medication List    TAKE these  medications   amiodarone 200 MG tablet Commonly known as:  PACERONE Take 1 tablet (200 mg total) by mouth daily.   baclofen 10 MG tablet Commonly known as:  LIORESAL Take 10 mg by mouth 3 (three) times daily as needed for muscle spasms.   denosumab 60 MG/ML Soln injection Commonly known as:  PROLIA Inject 60 mg into the skin every 6 (six) months. Administer in upper arm, thigh, or abdomen   ELIQUIS 2.5 MG Tabs  tablet Generic drug:  apixaban TAKE ONE TABLET BY MOUTH TWICE DAILY   HYDROcodone-acetaminophen 5-325 MG tablet Commonly known as:  NORCO/VICODIN Take 1 tablet by mouth every 6 (six) hours as needed for moderate pain.   Iron 325 (65 Fe) MG Tabs Take 1 tablet by mouth daily.   levothyroxine 75 MCG tablet Commonly known as:  SYNTHROID, LEVOTHROID TAKE ONE TABLET BY MOUTH ONCE DAILY.   loratadine 10 MG tablet Commonly known as:  CLARITIN Take 1 tablet (10 mg total) by mouth daily.   meclizine 12.5 MG tablet Commonly known as:  ANTIVERT Take 1 tablet (12.5 mg total) by mouth 2 (two) times daily as needed for dizziness.   metolazone 2.5 MG tablet Commonly known as:  ZAROXOLYN Take 1 tablet (2.5 mg total) by mouth as needed (for weight 134 lb or greater).   nitroGLYCERIN 0.4 MG SL tablet Commonly known as:  NITROSTAT Place 1 tablet (0.4 mg total) under the tongue every 5 (five) minutes as needed. For chest pain   pantoprazole 40 MG tablet Commonly known as:  PROTONIX Take 40 mg by mouth daily.   potassium chloride SA 20 MEQ tablet Commonly known as:  K-DUR,KLOR-CON Take 2 tablets (40 mEq total) by mouth daily. Take an additional 33meq with Metolazone.   senna 8.6 MG Tabs tablet Commonly known as:  SENOKOT Take 2 tablets (17.2 mg total) by mouth at bedtime. What changed:  when to take this  reasons to take this   simvastatin 10 MG tablet Commonly known as:  ZOCOR TAKE ONE TABLET BY MOUTH AT BEDTIME   torsemide 20 MG tablet Commonly known as:   DEMADEX Take 2 tablets (40 mg total) by mouth daily. Please start on 11/22/2016       Major procedures and Radiology Reports - PLEASE review detailed and final reports for all details, in brief -      Dg Chest 2 View  Result Date: 03/08/2017 CLINICAL DATA:  Chest pain EXAM: CHEST  2 VIEW COMPARISON:  01/27/2017 FINDINGS: Cardiac shadow is stable. Pacing device is again seen stable perihilar well-aerated bilaterally through mild atelectatic changes the left base is again identified. No sizable infiltrate is noted. Degenerative changes of the thoracic spine are seen. IMPRESSION: Stable left basilar atelectasis. Electronically Signed   By: Inez Catalina M.D.   On: 03/08/2017 19:59    Micro Results     No results found for this or any previous visit (from the past 240 hour(s)).     Today   Subjective    [name] today at present feeling better, denies epigastric pain. Cleared by cardiology from cardiac standpoint,  Denies fever, chills, cough orthopnea, pnd.   no headache,no new weakness tingling or numbness, wants to go home today.   Objective   Blood pressure (!) 95/52, pulse 73, temperature 97.7 F (36.5 C), temperature source Oral, resp. rate 18, height 4\' 10"  (1.473 m), weight 56.7 kg (124 lb 14.4 oz), SpO2 97 %.   Intake/Output Summary (Last 24 hours) at 03/10/17 0549 Last data filed at 03/09/17 2150  Gross per 24 hour  Intake              360 ml  Output              150 ml  Net              210 ml    Exam Awake Alert, Oriented x 3, No new F.N deficits, Normal affect [place].AT,PERRAL Supple Neck,No  JVD, No cervical lymphadenopathy appriciated.  Symmetrical Chest wall movement, Good air movement bilaterally, CTAB Heart: rrr s1, s2, 2/6 sem apex +ve B.Sounds, Abd Soft, Non tender, No organomegaly appriciated, No rebound -guarding or rigidity. No Cyanosis, Clubbing or edema, No new Rash or bruise   Data Review   CBC w Diff: Lab Results  Component Value Date    WBC 4.6 03/10/2017   HGB 13.2 03/10/2017   HGB 12.5 07/30/2013   HCT 40.8 03/10/2017   HCT 37.5 07/30/2013   PLT 98 (L) 03/10/2017   PLT 153 07/30/2013   LYMPHOPCT 28 03/09/2017   LYMPHOPCT 27.7 07/30/2013   BANDSPCT PENDING 11/01/2013   MONOPCT 8 03/09/2017   MONOPCT 7.8 07/30/2013   EOSPCT 3 03/09/2017   EOSPCT 2.9 07/30/2013   BASOPCT 1 03/09/2017   BASOPCT 1.4 07/30/2013    CMP: Lab Results  Component Value Date   NA 139 03/10/2017   NA 142 07/30/2013   K 4.3 03/10/2017   K 4.3 07/30/2013   CL 108 03/10/2017   CL 107 01/11/2013   CO2 24 03/10/2017   CO2 28 07/30/2013   BUN 32 (H) 03/10/2017   BUN 21.4 07/30/2013   CREATININE 1.89 (H) 03/10/2017   CREATININE 2.63 (H) 04/04/2016   CREATININE 1.0 07/30/2013   PROT 5.8 (L) 03/10/2017   PROT 6.6 07/30/2013   ALBUMIN 3.2 (L) 03/10/2017   ALBUMIN 3.3 (L) 07/30/2013   BILITOT 0.8 03/10/2017   BILITOT 0.58 07/30/2013   ALKPHOS 53 03/10/2017   ALKPHOS 89 07/30/2013   AST 20 03/10/2017   AST 15 07/30/2013   ALT 19 03/10/2017   ALT 12 07/30/2013  .   Total Time in preparing paper work, data evaluation and todays exam - 6 minutes  Jani Gravel M.D on 03/10/2017 at 5:49 AM  [facility]   Office  [phone]

## 2017-03-10 NOTE — Progress Notes (Signed)
Patient in a stable contrition, discharge education reviewed with patient and daughter at bedside, patient verbalised understanding, patient belongings at bedside, tele dc ccmd notified, iv removed, patient to be transported by daughter.

## 2017-03-10 NOTE — Progress Notes (Signed)
Patient c/o of chest pain 8/10, vital signs obtained remained stable, EKG obtained, 1 nitro given, patient refused a second nitro stating that she feels better and would like to see the doctor, Kim MD notified, patient lying quietly in bed, echo tech at bedside, will continue to monitor.

## 2017-03-11 ENCOUNTER — Emergency Department (HOSPITAL_COMMUNITY)
Admission: EM | Admit: 2017-03-11 | Discharge: 2017-03-11 | Disposition: A | Discharge status: Home / Self Care | Payer: Medicare Other | Attending: Emergency Medicine | Admitting: Emergency Medicine

## 2017-03-11 ENCOUNTER — Encounter (HOSPITAL_COMMUNITY): Payer: Self-pay | Admitting: Emergency Medicine

## 2017-03-11 ENCOUNTER — Emergency Department (HOSPITAL_COMMUNITY): Admission: EM | Admit: 2017-03-11 | Payer: Medicare Other | Source: Home / Self Care

## 2017-03-11 DIAGNOSIS — Z79899 Other long term (current) drug therapy: Secondary | ICD-10-CM | POA: Insufficient documentation

## 2017-03-11 DIAGNOSIS — N184 Chronic kidney disease, stage 4 (severe): Secondary | ICD-10-CM | POA: Insufficient documentation

## 2017-03-11 DIAGNOSIS — I130 Hypertensive heart and chronic kidney disease with heart failure and stage 1 through stage 4 chronic kidney disease, or unspecified chronic kidney disease: Secondary | ICD-10-CM | POA: Diagnosis not present

## 2017-03-11 DIAGNOSIS — K5641 Fecal impaction: Secondary | ICD-10-CM | POA: Diagnosis not present

## 2017-03-11 DIAGNOSIS — I5032 Chronic diastolic (congestive) heart failure: Secondary | ICD-10-CM | POA: Insufficient documentation

## 2017-03-11 DIAGNOSIS — Z85828 Personal history of other malignant neoplasm of skin: Secondary | ICD-10-CM | POA: Diagnosis not present

## 2017-03-11 DIAGNOSIS — K5900 Constipation, unspecified: Secondary | ICD-10-CM | POA: Diagnosis present

## 2017-03-11 DIAGNOSIS — Z7901 Long term (current) use of anticoagulants: Secondary | ICD-10-CM | POA: Insufficient documentation

## 2017-03-11 DIAGNOSIS — Z950 Presence of cardiac pacemaker: Secondary | ICD-10-CM | POA: Insufficient documentation

## 2017-03-11 DIAGNOSIS — E039 Hypothyroidism, unspecified: Secondary | ICD-10-CM | POA: Diagnosis not present

## 2017-03-11 LAB — COMPREHENSIVE METABOLIC PANEL WITH GFR
ALT: 18 U/L (ref 14–54)
AST: 22 U/L (ref 15–41)
Albumin: 3.5 g/dL (ref 3.5–5.0)
Alkaline Phosphatase: 64 U/L (ref 38–126)
Anion gap: 7 (ref 5–15)
BUN: 27 mg/dL — ABNORMAL HIGH (ref 6–20)
CO2: 20 mmol/L — ABNORMAL LOW (ref 22–32)
Calcium: 8.2 mg/dL — ABNORMAL LOW (ref 8.9–10.3)
Chloride: 111 mmol/L (ref 101–111)
Creatinine, Ser: 1.72 mg/dL — ABNORMAL HIGH (ref 0.44–1.00)
GFR calc Af Amer: 29 mL/min — ABNORMAL LOW
GFR calc non Af Amer: 25 mL/min — ABNORMAL LOW
Glucose, Bld: 101 mg/dL — ABNORMAL HIGH (ref 65–99)
Potassium: 3.9 mmol/L (ref 3.5–5.1)
Sodium: 138 mmol/L (ref 135–145)
Total Bilirubin: 0.6 mg/dL (ref 0.3–1.2)
Total Protein: 6.2 g/dL — ABNORMAL LOW (ref 6.5–8.1)

## 2017-03-11 LAB — CBC WITH DIFFERENTIAL/PLATELET
Basophils Absolute: 0 K/uL (ref 0.0–0.1)
Basophils Relative: 0 %
Eosinophils Absolute: 0.1 K/uL (ref 0.0–0.7)
Eosinophils Relative: 1 %
HCT: 40.1 % (ref 36.0–46.0)
Hemoglobin: 13.1 g/dL (ref 12.0–15.0)
Lymphocytes Relative: 15 %
Lymphs Abs: 0.8 K/uL (ref 0.7–4.0)
MCH: 31.1 pg (ref 26.0–34.0)
MCHC: 32.7 g/dL (ref 30.0–36.0)
MCV: 95.2 fL (ref 78.0–100.0)
Monocytes Absolute: 0.3 K/uL (ref 0.1–1.0)
Monocytes Relative: 6 %
Neutro Abs: 4.3 K/uL (ref 1.7–7.7)
Neutrophils Relative %: 78 %
Platelets: 124 K/uL — ABNORMAL LOW (ref 150–400)
RBC: 4.21 MIL/uL (ref 3.87–5.11)
RDW: 15.4 % (ref 11.5–15.5)
WBC: 5.5 K/uL (ref 4.0–10.5)

## 2017-03-11 LAB — ECHOCARDIOGRAM COMPLETE
HEIGHTINCHES: 58 in
WEIGHTICAEL: 1998.4 [oz_av]

## 2017-03-11 MED ORDER — LIDOCAINE HCL 2 % EX GEL
1.0000 "application " | Freq: Once | CUTANEOUS | Status: AC
  Administered 2017-03-11: 1 via TOPICAL
  Filled 2017-03-11: qty 20

## 2017-03-11 MED ORDER — POLYETHYLENE GLYCOL 3350 17 G PO PACK: 17.0000 g | PACK | Freq: Every day | ORAL | 0 refills | Status: DC

## 2017-03-11 NOTE — ED Provider Notes (Signed)
[facility] DEPT Provider Note   CSN: [id number] Arrival date & time: 03/11/17  0720     History   Chief Complaint Chief Complaint  Patient presents with  . Constipation    HPI [name] is a 83 y.o. female.  The history is provided by the patient. No language interpreter was used.  Constipation     [name] is a 83 y.o. female who presents to the Emergency Department complaining of constipation.  She reports constipation and difficulty having a bowel movement since last night. She was recently observed in the hospital and discharged home at 5 PM yesterday. Since 7 PM yesterday she has been typing have a bowel movement and has been unable to go.  She reports rectal burning as well as dysuria. She took Senokot at home with no improvement in her symptoms. She has mild associated nausea. No vomiting. She did have some blood when she wiped home when straining to have a bowel movement. Past Medical History:  Diagnosis Date  . Anemia   . Atrial fibrillation ([place])    a. Dx 01/2014->Eliquis started.  . Cancer ([place])   . Chest pain    a. 2011 Neg MV;  b. 01/2014 Cath: LM 20-30, LAD nl, D1 nl, LCX nl, OM1 nl, RCA dom 30-38m, PD/PL nl, EF 65%->Med Rx; c. 03/2016 MV: no ischemia/infarct. EF 63%.  . Degenerative joint disease   . Diastolic CHF, acute on chronic ([place]) 01/31/2012  . Dyslipidemia   . Dysrhythmia   . Family history of adverse reaction to anesthesia    " multiple family members have difficulty waking "  . GERD (gastroesophageal reflux disease)   . History of skin cancer    tafeen/ non melanoma.   Marland Kitchen Hx of varicella   . Hypertension   . Hypothyroidism   . Monoclonal gammopathies   . Pacemaker   . Palpitations    PVCs/bigeminy on event in May 2009 revealing this was relatively asymptomatic  . Right lower quadrant abdominal pain 07/19/2012   Recurrent  With nausea    This is new since she had ct for llq pain in [facility]    R/o appendiceal problem  Hernia  Get  ct scan  And plan  Fu     . Thrombocytopenia [facility])     Patient Active Problem List   Diagnosis Date Noted  . Mitral regurgitation 03/10/2017  . Mitral stenosis 03/10/2017  . Chest pain 03/08/2017  . Dehydration 11/18/2016  . Intractable nausea and vomiting 11/18/2016  . Generalized weakness 11/18/2016  . Acute kidney injury superimposed on chronic kidney disease ([place]) 11/11/2016  . Hypokalemia 11/11/2016  . Hypertensive heart disease 04/01/2016  . PAF (paroxysmal atrial fibrillation) ([place]) 03/31/2016  . Chest pain with moderate risk for cardiac etiology, negative MI and Nuc study, + muscular skeletal   . Atypical chest pain 01/21/2016  . CKD (chronic kidney disease) stage 4, GFR 15-29 ml/min (HCC) 05/09/2015  . Anemia of chronic disease 05/09/2015  . Dyslipidemia 05/09/2015  . Iron deficiency anemia 11/21/2014  . Pacemaker 04/27/2014  . Chronic anticoagulation 02/25/2014  . Chronic diastolic CHF (congestive heart failure) ([place]) 02/24/2014  . Atrial fibrillation ([place]) 01/29/2014  . MGUS (monoclonal gammopathy of unknown significance) 08/01/2013  . Thrombocytopenia ([place])   . Hypothyroidism 02/19/2012  . Hyperlipidemia 12/15/2008  . HYPERTENSION, BENIGN 12/15/2008    Past Surgical History:  Procedure Laterality Date  . CARDIOVERSION N/A 02/26/2014   Procedure: CARDIOVERSION AT BEDSIDE;  Surgeon: Pixie Casino,  MD;  Location: [facility];  Service: Cardiovascular;  Laterality: N/A;  . CARDIOVERSION N/A 04/22/2014   Procedure: CARDIOVERSION (BEDSIDE);  Surgeon: [name], MD;  Location: [facility] OR;  Service: Cardiovascular;  Laterality: N/A;  . CATARACT EXTRACTION     Bilateral  implantt  . CESAREAN SECTION     times 2  . CHOLECYSTECTOMY  1999  . ESOPHAGOGASTRODUODENOSCOPY N/A 07/06/2014   Procedure: ESOPHAGOGASTRODUODENOSCOPY (EGD);  Surgeon: Ladene Artist, MD;  Location: [facility] ENDOSCOPY;  Service: Endoscopy;  Laterality: N/A;  . INSERT / REPLACE / REMOVE PACEMAKER    . LEFT HEART  CATHETERIZATION WITH CORONARY ANGIOGRAM N/A 01/29/2014   Procedure: LEFT HEART CATHETERIZATION WITH CORONARY ANGIOGRAM;  Surgeon: Blane Ohara, MD;  Location: [facility] CATH LAB;  Service: Cardiovascular;  Laterality: N/A;  . PACEMAKER INSERTION  04/23/2014   STJ Assurity dual chamber pacemaker implanted by Dr Rayann Heman  . PERMANENT PACEMAKER INSERTION N/A 04/23/2014   Procedure: PERMANENT PACEMAKER INSERTION;  Surgeon: Evans Lance, MD;  Location: [facility] CATH LAB;  Service: Cardiovascular;  Laterality: N/A;  . RIGHT HEART CATHETERIZATION N/A 09/22/2014   Procedure: RIGHT HEART CATH;  Surgeon: Jolaine Artist, MD;  Location: [facility] CATH LAB;  Service: Cardiovascular;  Laterality: N/A;  . SHOULDER SURGERY  1996   Right     OB History    No data available       Home Medications    Prior to Admission medications   Medication Sig Start Date End Date Taking? Authorizing Provider  amiodarone (PACERONE) 200 MG tablet Take 1 tablet (200 mg total) by mouth daily. 11/25/16   Jolaine Artist, MD  baclofen (LIORESAL) 10 MG tablet Take 10 mg by mouth 3 (three) times daily as needed for muscle spasms.     Historical Provider, MD  denosumab (PROLIA) 60 MG/ML SOLN injection Inject 60 mg into the skin every 6 (six) months. Administer in upper arm, thigh, or abdomen    Historical Provider, MD  ELIQUIS 2.5 MG TABS tablet TAKE ONE TABLET BY MOUTH TWICE DAILY 10/18/16   Jolaine Artist, MD  Ferrous Sulfate (IRON) 325 (65 Fe) MG TABS Take 1 tablet by mouth daily. 07/01/16   Clanford Marisa Hua, MD  HYDROcodone-acetaminophen (NORCO/VICODIN) 5-325 MG tablet Take 1 tablet by mouth every 6 (six) hours as needed for moderate pain.  11/20/16   Historical Provider, MD  levothyroxine (SYNTHROID, LEVOTHROID) 75 MCG tablet TAKE ONE TABLET BY MOUTH ONCE DAILY. 01/24/17   Burnis Medin, MD  loratadine (CLARITIN) 10 MG tablet Take 1 tablet (10 mg total) by mouth daily. 03/10/17   Jani Gravel, MD  meclizine (ANTIVERT) 12.5 MG tablet Take  1 tablet (12.5 mg total) by mouth 2 (two) times daily as needed for dizziness. 09/14/16   Jolaine Artist, MD  metolazone (ZAROXOLYN) 2.5 MG tablet Take 1 tablet (2.5 mg total) by mouth as needed (for weight 134 lb or greater). 03/09/16   Jolaine Artist, MD  nitroGLYCERIN (NITROSTAT) 0.4 MG SL tablet Place 1 tablet (0.4 mg total) under the tongue every 5 (five) minutes as needed. For chest pain 05/06/16   Jolaine Artist, MD  pantoprazole (PROTONIX) 40 MG tablet Take 40 mg by mouth daily.    Historical Provider, MD  polyethylene glycol (MIRALAX) packet Take 17 g by mouth daily. Mix with 8 ounces of liquid. 03/11/17   Quintella Reichert, MD  potassium chloride SA (K-DUR,KLOR-CON) 20 MEQ tablet Take 2 tablets (40 mEq total) by mouth daily. Take  an additional 37meq with Metolazone. 10/04/16   Jolaine Artist, MD  senna (SENOKOT) 8.6 MG TABS tablet Take 2 tablets (17.2 mg total) by mouth at bedtime. Patient taking differently: Take 2 tablets by mouth at bedtime as needed for mild constipation.  11/13/16   Doreatha Lew, MD  simvastatin (ZOCOR) 10 MG tablet TAKE ONE TABLET BY MOUTH AT BEDTIME 02/14/17   Larey Dresser, MD  torsemide (DEMADEX) 20 MG tablet Take 2 tablets (40 mg total) by mouth daily. Please start on 11/22/2016 11/22/16   Albertine Patricia, MD    Family History Family History  Problem Relation Age of Onset  . Coronary artery disease Father   . Heart disease Father   . Heart attack Father   . Alzheimer's disease Mother   . Lung cancer Brother 37  . Lung cancer      Social History Social History  Substance Use Topics  . Smoking status: Never Smoker  . Smokeless tobacco: Never Used  . Alcohol use No     Allergies   Other; Tramadol; and Ibuprofen   Review of Systems Review of Systems  Gastrointestinal: Positive for constipation.  All other systems reviewed and are negative.    Physical Exam Updated Vital Signs BP 116/90   Pulse 74   Temp 97.4 F (36.3 C)  (Oral)   Resp (!) 25   SpO2 96%   Physical Exam  Constitutional: She is oriented to person, place, and time. She appears well-developed and well-nourished.  HENT:  Head: Normocephalic and atraumatic.  Cardiovascular: Normal rate and regular rhythm.   No murmur heard. Pulmonary/Chest: Effort normal and breath sounds normal. No respiratory distress.  Abdominal: Soft. There is no rebound and no guarding.  Few petechiae over the lower abdominal wall. Mild lower abdominal tenderness  Genitourinary:  Genitourinary Comments: 1 nonthrombosed external hemorrhoid. No gross blood on digital rectal exam. There is firm stool in the rectal vault that is ballotable and able to disimpact on initial exam.  Musculoskeletal: She exhibits no edema or tenderness.  Neurological: She is alert and oriented to person, place, and time.  Skin: Skin is warm and dry.  Psychiatric: She has a normal mood and affect. Her behavior is normal.  Nursing note and vitals reviewed.    ED Treatments / Results  Labs (all labs ordered are listed, but only abnormal results are displayed) Labs Reviewed  COMPREHENSIVE METABOLIC PANEL - Abnormal; Notable for the following:       Result Value   CO2 20 (*)    Glucose, Bld 101 (*)    BUN 27 (*)    Creatinine, Ser 1.72 (*)    Calcium 8.2 (*)    Total Protein 6.2 (*)    GFR calc non Af Amer 25 (*)    GFR calc Af Amer 29 (*)    All other components within normal limits  CBC WITH DIFFERENTIAL/PLATELET - Abnormal; Notable for the following:    Platelets 124 (*)    All other components within normal limits    EKG  EKG Interpretation None       Radiology No results found.  Procedures Procedures (including critical care time)  Medications Ordered in ED Medications  lidocaine (XYLOCAINE) 2 % jelly 1 application (1 application Topical Given 03/11/17 0947)     Initial Impression / Assessment and Plan / ED Course  I have reviewed the triage vital signs and the  nursing notes.  Pertinent labs & imaging results that were  available during my care of the patient were reviewed by me and considered in my medical decision making (see chart for details).     Patient here for evaluation of constipation. She does have firm stool on rectal exam but unable to disimpact on initial exam. She was given an enema and able to pass a large stool ball and feels significantly improved with resolution of her impaction. There is no evidence of major bleeding on examination and her labs are at her baseline. D/w patient home care for constipation. Discussed outpatient follow-up and return precautions.  Final Clinical Impressions(s) / ED Diagnoses   Final diagnoses:  Constipation, unspecified constipation type    New Prescriptions New Prescriptions   POLYETHYLENE GLYCOL (MIRALAX) PACKET    Take 17 g by mouth daily. Mix with 8 ounces of liquid.     Quintella Reichert, MD 03/11/17 1029

## 2017-03-11 NOTE — ED Triage Notes (Addendum)
Pt from home via EMS with c/o constipation x 2 days. Pt also reported that she noticed blood in toilet and in underwear. Pt took 2 laxatives last night without relief. Pt was discharged yesterday from hospital admission that began on 03/08/17.

## 2017-03-11 NOTE — ED Triage Notes (Signed)
Patient arrives to ED via [facility] from home. Patient lives alone. EMS reports: Patient c/o constipation x 2 days. Also, reports blood noted in toilet and on panties. Patient states that she took 2 laxatives last night with no effect.  VSS. BP 120/66, Pulse 82, 20 Rest, 97% on room air.

## 2017-03-13 ENCOUNTER — Telehealth: Payer: Self-pay

## 2017-03-13 ENCOUNTER — Telehealth: Payer: Self-pay | Admitting: Cardiology

## 2017-03-13 NOTE — Telephone Encounter (Signed)
Pt called and stated that her home monitor started beeping and flashing last night. Informed pt to call tech support to see if they know why it was making a noise and flashing.

## 2017-03-13 NOTE — Telephone Encounter (Signed)
D/C 03/10/17 To: home  Spoke with pt and she states that she is doing well. She did have to return to the ED for an episode of constipation that has since resolved. She has no other complaints or concerns. She denies any further chest pain or cardiac s/s.   Appt scheduled with Dr. Regis Bill 03/24/17, pt aware.   Transition Care Management Follow-up Telephone Call  How have you been since you were released from the hospital? Good, sleeping a lot   Do you understand why you were in the hospital? yes   Do you understand the discharge instructions? yes  Items Reviewed:  Medications reviewed: yes  Allergies reviewed: yes  Dietary changes reviewed: yes  Referrals reviewed: yes   Functional Questionnaire:   Activities of Daily Living (ADLs):   She states they are independent in the following: ambulation, bathing and hygiene, feeding, continence, grooming, toileting and dressing States they require assistance with the following: driving   Any transportation issues/concerns?: no   Any patient concerns? no   Confirmed importance and date/time of follow-up visits scheduled: yes   Confirmed with patient if condition begins to worsen call PCP or go to the ER.  Patient was given the Call-a-Nurse line [phone]: yes

## 2017-03-20 ENCOUNTER — Telehealth: Payer: Self-pay | Admitting: Internal Medicine

## 2017-03-20 NOTE — Telephone Encounter (Signed)
Spoke with [name], [name] she stated that it was her home monitor that was had lights blinking on it, informed her that her home monitor was up to date on as far as connectivity was concerned and likely it was searching for cell signal. [name]. [name] voiced understanding.

## 2017-03-20 NOTE — Telephone Encounter (Signed)
°  1. Has your device fired? yes  2. Is you device beeping? yes  3. Are you experiencing draining or swelling at device site? no 4. Are you calling to see if we received your device transmission? no 5. Have you passed out?  no

## 2017-03-23 ENCOUNTER — Telehealth: Payer: Self-pay | Admitting: Internal Medicine

## 2017-03-23 NOTE — Telephone Encounter (Signed)
Pt does not ave a ride to get here tomorrow, had to cancel appt. Pt states only only time she can come in next Thursday. Is it ok to schedule?

## 2017-03-23 NOTE — Telephone Encounter (Signed)
Ok but needs 30 minutes   appt.

## 2017-03-24 ENCOUNTER — Ambulatory Visit: Payer: Medicare Other | Admitting: Internal Medicine

## 2017-03-24 NOTE — Telephone Encounter (Signed)
Pt has been scheduled.  °

## 2017-03-30 ENCOUNTER — Encounter: Payer: Self-pay | Admitting: Internal Medicine

## 2017-03-30 ENCOUNTER — Ambulatory Visit (INDEPENDENT_AMBULATORY_CARE_PROVIDER_SITE_OTHER): Payer: Medicare Other | Admitting: Internal Medicine

## 2017-03-30 VITALS — BP 112/80 | HR 88 | Temp 97.6°F | Ht <= 58 in | Wt 127.4 lb

## 2017-03-30 DIAGNOSIS — R252 Cramp and spasm: Secondary | ICD-10-CM | POA: Diagnosis not present

## 2017-03-30 DIAGNOSIS — R54 Age-related physical debility: Secondary | ICD-10-CM | POA: Diagnosis not present

## 2017-03-30 DIAGNOSIS — H6120 Impacted cerumen, unspecified ear: Secondary | ICD-10-CM

## 2017-03-30 DIAGNOSIS — Z9189 Other specified personal risk factors, not elsewhere classified: Secondary | ICD-10-CM

## 2017-03-30 DIAGNOSIS — R441 Visual hallucinations: Secondary | ICD-10-CM | POA: Diagnosis not present

## 2017-03-30 DIAGNOSIS — H9311 Tinnitus, right ear: Secondary | ICD-10-CM | POA: Diagnosis not present

## 2017-03-30 DIAGNOSIS — Z7901 Long term (current) use of anticoagulants: Secondary | ICD-10-CM

## 2017-03-30 DIAGNOSIS — Z8659 Personal history of other mental and behavioral disorders: Secondary | ICD-10-CM | POA: Diagnosis not present

## 2017-03-30 DIAGNOSIS — I5042 Chronic combined systolic (congestive) and diastolic (congestive) heart failure: Secondary | ICD-10-CM

## 2017-03-30 DIAGNOSIS — Z87898 Personal history of other specified conditions: Secondary | ICD-10-CM

## 2017-03-30 NOTE — Patient Instructions (Signed)
Yo  Not sure whey you are having jerking of the right hand and   Le at times The clicking may not be important to your health Ct scan of head was done in march and ok.  However    I advise we get neurology consults in regard to the jerkin g  caution with meclizine and pain pills that can cause drowsiness and risk of falling. You do have some wax in the right ear sometimes that can cause head symptoms but would not explain the other symptoms you have.   You wil lbe contacted about referral appt but if worse  contact us in interim .

## 2017-03-30 NOTE — Progress Notes (Signed)
Chief Complaint  Patient presents with  . Shortness of Breath    worreid about arm jerks clicking in head left and ? seeing things different?    HPI: [name] 83 y.o. come in for  Ongoing  problesm  Since last visit she had another ed visit    Still lives a lone  She is here with helper  Was seen in ed  4 28 for constipation final dx  4 25 hospt 2 days  for chest pain  Poss gi  Last bvisit we had discussed  Caution with polypharamcy and how meds are taken and making sure double check   Since then she is more concerned with his ongoing clicking in the right side of her head that is not related to anything specific not associated with hearing is been going on for a few months maybe 6 she is also most recently had episodes of her right arm suddenly jerking and sometimes her lower body suddenly jerking lifting off her chair. No specific weakness or new falling episodes The other thing she has noticed is that sometimes she sees things that other people don't see the same Example a squirrel that was a men's oral she saw look like it was eating outside when out to check at her companion said no she didn't see. No obvious auditory hallucinations. She takes an occasional dizzy pill which I believe is Antivert In the past she has had hydrocodone from her sports medicine musculoskeletal Dr. Layne Benton but only takes a pain pill occasionally. We did talk about not taking some meds that could put her at risk of falling. She thinks her stomach isn't bad at this time. Doesn't think she is losing her memory.  Below is visit ed and hosp.  Patient here for evaluation of constipation. She does have firm stool on rectal exam but unable to disimpact on initial exam. She was given an enema and able to pass a large stool ball and feels significantly improved with resolution of her impaction. There is no evidence of major bleeding on examination and her labs are at her baseline. D/w patient home care for  constipation. Discussed outpatient follow-up and return precautions.  Final Clinical Impressions(s) / ED Diagnoses   Chest pain: Acute. Patient reports being followed by Dr. Missy Sabins ofcardiology. Heart score = 5. - trop I 0.03 x3 Cardiology consulted and thought to be atypical chest pain as she was pointing to epigastric area.  Echo =, no change Please f/u with cardiology in 1-2 weeks  Acute kidney injury on chronic kidney disease stage IV: patient's baseline creatinine had previously been 1.87 with BUN 44 the previous day. On admission and creatinine noted to be 2.18 and BUN 41. Pt was gently hydrated and this resolved.  Check bmp in am  Atrial fibrillation on chronic anticoagulation with PPM: Chronic. Currently rate controlled. chadvasc score 5 Continue home medication amiodarone and eliquis  Chronic diastolic heart failure: Stable. Echo done March 2017 with and EF of 48% grade 2 diastolic Cont  on torsemide  Hypothyroidism: TSH noted to be 3.28on 4/16. continue levothyroxine  Thrombocytopenia: chronic. Patient presents with a platelet count 121. No active source of bleeding noted. Continue to monitor  Dyslipidemia Continue simvastatin  GERD (CT scan 10/2016 unremarkable) Continue protonix,  if epigastric, discomfort recurs consider increase in protonix, and GI consultation as outpatient Please f/u with PCP regarding this matter   ROS: See pertinent positives and negatives per HPI. No fever recent falling headache?  waekness ?    Past Medical History:  Diagnosis Date  . Anemia   . Atrial fibrillation ([place])    a. Dx 01/2014->Eliquis started.  . Cancer ([place])   . Chest pain    a. 2011 Neg MV;  b. 01/2014 Cath: LM 20-30, LAD nl, D1 nl, LCX nl, OM1 nl, RCA dom 30-71m, PD/PL nl, EF 65%->Med Rx; c. 03/2016 MV: no ischemia/infarct. EF 63%.  . Degenerative joint disease   . Diastolic CHF, acute on chronic ([place]) 01/31/2012  . Dyslipidemia   . Dysrhythmia   . Family  history of adverse reaction to anesthesia    " multiple family members have difficulty waking "  . GERD (gastroesophageal reflux disease)   . History of skin cancer    tafeen/ non melanoma.   Marland Kitchen Hx of varicella   . Hypertension   . Hypothyroidism   . Monoclonal gammopathies   . Pacemaker   . Palpitations    PVCs/bigeminy on event in May 2009 revealing this was relatively asymptomatic  . Right lower quadrant abdominal pain 07/19/2012   Recurrent  With nausea    This is new since she had ct for llq pain in [facility]    R/o appendiceal problem  Hernia  Get ct scan  And plan  Fu     . Thrombocytopenia ([place])     Family History  Problem Relation Age of Onset  . Coronary artery disease Father   . Heart disease Father   . Heart attack Father   . Alzheimer's disease Mother   . Lung cancer Brother 50  . Lung cancer Unknown     Social History   Social History  . Marital status: Widowed    Spouse name: N/A  . Number of children: 3  . Years of education: N/A   Occupational History  .  Retired    [employer], book keeping   Social History Main Topics  . Smoking status: Never Smoker  . Smokeless tobacco: Never Used  . Alcohol use No  . Drug use: No  . Sexual activity: No   Other Topics Concern  . 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 Eats with her.      Daughter Joseph Art handles medications. Sees HH and PT once a week.   Helper  [place]  there Friday 1-3 pm    Outpatient Medications Prior to Visit  Medication Sig Dispense Refill  . amiodarone (PACERONE) 200 MG tablet Take 1 tablet (200 mg total) by mouth daily. 90 tablet 3  . baclofen (LIORESAL) 10 MG tablet Take 10 mg by mouth 3 (three) times daily as needed for muscle spasms.     Marland Kitchen denosumab (PROLIA)  60 MG/ML SOLN injection Inject 60 mg into the skin every 6 (six) months. Administer in upper arm, thigh, or abdomen    . ELIQUIS 2.5 MG TABS tablet TAKE ONE TABLET BY MOUTH TWICE DAILY 60 tablet 6  . Ferrous Sulfate (IRON) 325 (65 Fe) MG TABS Take 1 tablet by mouth daily. 180 each 0  . levothyroxine (SYNTHROID, LEVOTHROID) 75 MCG tablet TAKE ONE TABLET BY MOUTH ONCE DAILY. 90 tablet 0  . loratadine (  CLARITIN) 10 MG tablet Take 1 tablet (10 mg total) by mouth daily. 30 tablet 0  . meclizine (ANTIVERT) 12.5 MG tablet Take 1 tablet (12.5 mg total) by mouth 2 (two) times daily as needed for dizziness. 10 tablet 0  . metolazone (ZAROXOLYN) 2.5 MG tablet Take 1 tablet (2.5 mg total) by mouth as needed (for weight 134 lb or greater). 10 tablet 3  . nitroGLYCERIN (NITROSTAT) 0.4 MG SL tablet Place 1 tablet (0.4 mg total) under the tongue every 5 (five) minutes as needed. For chest pain 25 tablet 6  . pantoprazole (PROTONIX) 40 MG tablet Take 40 mg by mouth daily.    . polyethylene glycol (MIRALAX) packet Take 17 g by mouth daily. Mix with 8 ounces of liquid. 14 each 0  . potassium chloride SA (K-DUR,KLOR-CON) 20 MEQ tablet Take 2 tablets (40 mEq total) by mouth daily. Take an additional 60meq with Metolazone. 90 tablet 2  . senna (SENOKOT) 8.6 MG TABS tablet Take 2 tablets (17.2 mg total) by mouth at bedtime. (Patient taking differently: Take 2 tablets by mouth at bedtime as needed for mild constipation. ) 60 each 0  . simvastatin (ZOCOR) 10 MG tablet TAKE ONE TABLET BY MOUTH AT BEDTIME 30 tablet 3  . torsemide (DEMADEX) 20 MG tablet Take 2 tablets (40 mg total) by mouth daily. Please start on 11/22/2016 60 tablet 6   No facility-administered medications prior to visit.      EXAM:  BP 112/80 (BP Location: Right Arm, Patient Position: Sitting, Cuff Size: Normal)   Pulse 88   Temp 97.6 F (36.4 C) (Oral)   Ht 4\' 10"  (1.473 m)   Wt 127 lb 6.4 oz (57.8 kg)   SpO2 96%   BMI 26.63 kg/m   Body mass  index is 26.63 kg/m.  GENERAL: vitals reviewed and listed above, alert, , appears well hydrated and in no acute distress croses leg and self ambulatory  Nl speech  HEENT: atraumatic, conjunctiva  clear, no obvious abnormalities on inspection of external nose and ears wax in right eac  Left clear  OP : no lesion edema or exudate  NECK: no obvious masses on inspection palpation  LUNGS: clear to auscultation bilaterally, no wheezes, rales or rhonchi, good air movementhas kkyphosis  CV: irreg HRRR, no clubbing cyanosis or  peripheral edema nl cap refill  MS: moves all extremities   Min edema  PSYCH: pleasant and cooperative,  Lab Results  Component Value Date   WBC 5.5 03/11/2017   HGB 13.1 03/11/2017   HCT 40.1 03/11/2017   PLT 124 (L) 03/11/2017   GLUCOSE 101 (H) 03/11/2017   CHOL 149 03/09/2017   TRIG 63 03/09/2017   HDL 84 03/09/2017   LDLCALC 52 03/09/2017   ALT 18 03/11/2017   AST 22 03/11/2017   NA 138 03/11/2017   K 3.9 03/11/2017   CL 111 03/11/2017   CREATININE 1.72 (H) 03/11/2017   BUN 27 (H) 03/11/2017   CO2 20 (L) 03/11/2017   TSH 3.28 02/27/2017   INR 1.23 03/08/2017   BP Readings from Last 3 Encounters:  03/30/17 112/80  03/11/17 116/90  03/11/17 125/66   Wt Readings from Last 3 Encounters:  03/30/17 127 lb 6.4 oz (57.8 kg)  03/10/17 124 lb 14.4 oz (56.7 kg)  02/27/17 119 lb (54 kg)   revewied   Last ct head 3 18   ASSESSMENT AND PLAN:  Discussed the following assessment and plan:  Jerking movements of extremities - sounds  like myoclonic  pt reorts right  and ? otehr ? if could be from med other   get neuro consutl exam reassuring today no tremor  At risk for polypharmacy  Wax in ear - removed today see if helps clocking sound  Clicking tinnitus, right ?  Visual hallucination ? - see notes  sees movement  that others dont see concern  ? se mes? other   Advanced age  Chronic combined systolic and diastolic CHF (congestive heart failure)  (HCC)  History of confusion  Chronic anticoagulation Renal funct better than last dec  cre 2.7  gfr 25  tsh in range  On ppi  Cp felt to be poss gi related    Caution is best  So many meds  Challenging to  Tease out    She has been given hydrocodone by another doctor and Antivert by a second doctor she is no longer taking the muscle relaxant. I will have her record how often she takes a pain pill hydrocodone and how often she takes meclizine. Concern if getting  side effects of these medicines. Vs other process  Plan referral to neurology for help with delineating next step or avoidance of given medicine. Still lives alone eating  Better meals on wheels  Keep June appt. -Patient advised to return or notify health care team  if  new concerns arise.  Patient Instructions  Yo  Not sure whey you are having jerking of the right hand and   Le at times The clicking may not be important to your health Ct scan of head was done in march and ok.  However    I advise we get neurology consults in regard to the jerkin g  caution with meclizine and pain pills that can cause drowsiness and risk of falling. You do have some wax in the right ear sometimes that can cause head symptoms but would not explain the other symptoms you have.   You wil lbe contacted about referral appt but if worse  contact us in interim .       Standley Brooking. Panosh M.D.

## 2017-04-04 ENCOUNTER — Telehealth: Payer: Self-pay | Admitting: Internal Medicine

## 2017-04-04 NOTE — Telephone Encounter (Signed)
°  Patient Name: [name]  Gender: Female  DOB: Nov 23, 1924   Age: 83 Y 21 D  Return Phone Number: [phone] (Primary), [phone] (Secondary)  Address: [address]   City/State/Zip: [place] [address]   Client [facility] - Client  Client Site [facility] - Day  Contact Type Call  Who Is Calling Patient / Member / Family / Caregiver  Call Type Triage / Clinical  Relationship To Patient Self  Return Phone Number [phone] (Primary)  Chief Complaint Headache  Reason for Call Symptomatic / Request for Health Information  Initial Comment caller states she is having "banging " pounding in her head .   Appointment Disposition EMR Caller Not Reached  Info pasted into Epic No  Translation No   Nurse Assessment      Guidelines      Guideline Title Affirmed Question Affirmed Notes Nurse Date/Time (Eastern Time)         Disp. Time [name] Time) Disposition Final User   04/04/2017 1:08:34 PM Attempt made - message left  Tobie Lords   04/04/2017 1:30:09 PM Send To RN Personal  Hardin Negus, RN, Amanda    04/04/2017 1:17:17 PM FINAL ATTEMPT MADE - message left Yes Hardin Negus, RN, Estill Bamberg       Comments  User: Caryl Comes Date/Time [name] Time): 04/04/2017 1:15:44 PM  Number correct per recording.  User: Eloisa Northern, RN Date/Time [name] Time): 04/04/2017 1:45:45 PM

## 2017-04-04 NOTE — Telephone Encounter (Signed)
ATC pt at both numbers in message. Primary number rings with no answer and no voicemail. Secondary number goes straight to voicemail.  LMTCB

## 2017-04-05 ENCOUNTER — Ambulatory Visit (INDEPENDENT_AMBULATORY_CARE_PROVIDER_SITE_OTHER): Payer: Medicare Other | Admitting: Internal Medicine

## 2017-04-05 ENCOUNTER — Encounter: Payer: Self-pay | Admitting: Internal Medicine

## 2017-04-05 VITALS — BP 104/60 | HR 73 | Temp 97.4°F | Ht <= 58 in | Wt 124.8 lb

## 2017-04-05 DIAGNOSIS — H9311 Tinnitus, right ear: Secondary | ICD-10-CM | POA: Diagnosis not present

## 2017-04-05 NOTE — Patient Instructions (Signed)
Am not sure the cause of your ear symptoms     I your ear exam  Looks ok to me  I will get a  referfal to ENT ear doctor to get a good exam and ? Next step.  You will be contacted about  Referral appt .

## 2017-04-05 NOTE — Progress Notes (Signed)
Chief Complaint  Patient presents with  . Acute Visit    HPI: [name] 83 y.o.  comesin today for  Acute visit  Here with caretaker   Since her last visit she still had problematic sounds in her right ear. She describes it is clicking but sometimes wakes her up at night like sound of paper  rubbing in her ear. Other times she gets what she calls a pulse 80 type of sound. No high-pitched or low pitched ringing doesn't notice a change in her hearing. Last time we flushed out wax not sure if it made any difference. No specific vertigo associated. She has a pending referral to neurology about the right arm jerking and question of seeing things drift at times visually from the peripheral vision. ROS: See pertinent positives and negatives per HPI.  Past Medical History:  Diagnosis Date  . Anemia   . Atrial fibrillation ([place])    a. Dx 01/2014->Eliquis started.  . Cancer ([place])   . Chest pain    a. 2011 Neg MV;  b. 01/2014 Cath: LM 20-30, LAD nl, D1 nl, LCX nl, OM1 nl, RCA dom 30-3m, PD/PL nl, EF 65%->Med Rx; c. 03/2016 MV: no ischemia/infarct. EF 63%.  . Degenerative joint disease   . Diastolic CHF, acute on chronic ([place]) 01/31/2012  . Dyslipidemia   . Dysrhythmia   . Family history of adverse reaction to anesthesia    " multiple family members have difficulty waking "  . GERD (gastroesophageal reflux disease)   . History of skin cancer    tafeen/ non melanoma.   Marland Kitchen Hx of varicella   . Hypertension   . Hypothyroidism   . Monoclonal gammopathies   . Pacemaker   . Palpitations    PVCs/bigeminy on event in May 2009 revealing this was relatively asymptomatic  . Right lower quadrant abdominal pain 07/19/2012   Recurrent  With nausea    This is new since she had ct for llq pain in [facility]    R/o appendiceal problem  Hernia  Get ct scan  And plan  Fu     . Thrombocytopenia ([place])     Family History  Problem Relation Age of Onset  . Coronary artery disease Father   . Heart  disease Father   . Heart attack Father   . Alzheimer's disease Mother   . Lung cancer Brother 9  . Lung cancer Unknown     Social History   Social History  . Marital status: Widowed    Spouse name: N/A  . Number of children: 3  . Years of education: N/A   Occupational History  .  Retired    [employer], book keeping   Social History Main Topics  . Smoking status: Never Smoker  . Smokeless tobacco: Never Used  . Alcohol use No  . Drug use: No  . Sexual activity: No   Other Topics Concern  . 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 Eats with her.      Daughter Joseph Art handles medications. Sees HH and PT once a week.   Helper  [place]  there Friday 1-3 pm  Outpatient Medications Prior to Visit  Medication Sig Dispense Refill  . amiodarone (PACERONE) 200 MG tablet Take 1 tablet (200 mg total) by mouth daily. 90 tablet 3  . denosumab (PROLIA) 60 MG/ML SOLN injection Inject 60 mg into the skin every 6 (six) months. Administer in upper arm, thigh, or abdomen    . ELIQUIS 2.5 MG TABS tablet TAKE ONE TABLET BY MOUTH TWICE DAILY 60 tablet 6  . Ferrous Sulfate (IRON) 325 (65 Fe) MG TABS Take 1 tablet by mouth daily. 180 each 0  . HYDROcodone-acetaminophen (NORCO/VICODIN) 5-325 MG tablet   0  . levothyroxine (SYNTHROID, LEVOTHROID) 75 MCG tablet TAKE ONE TABLET BY MOUTH ONCE DAILY. 90 tablet 0  . loratadine (CLARITIN) 10 MG tablet Take 1 tablet (10 mg total) by mouth daily. 30 tablet 0  . meclizine (ANTIVERT) 12.5 MG tablet Take 1 tablet (12.5 mg total) by mouth 2 (two) times daily as needed for dizziness. 10 tablet 0  . metolazone (ZAROXOLYN) 2.5 MG tablet Take 1 tablet (2.5 mg total) by mouth as needed (for weight 134 lb or greater). 10 tablet 3  .  nitroGLYCERIN (NITROSTAT) 0.4 MG SL tablet Place 1 tablet (0.4 mg total) under the tongue every 5 (five) minutes as needed. For chest pain 25 tablet 6  . pantoprazole (PROTONIX) 40 MG tablet Take 40 mg by mouth daily.    . polyethylene glycol (MIRALAX) packet Take 17 g by mouth daily. Mix with 8 ounces of liquid. 14 each 0  . potassium chloride SA (K-DUR,KLOR-CON) 20 MEQ tablet Take 2 tablets (40 mEq total) by mouth daily. Take an additional 74meq with Metolazone. 90 tablet 2  . senna (SENOKOT) 8.6 MG TABS tablet Take 2 tablets (17.2 mg total) by mouth at bedtime. (Patient taking differently: Take 2 tablets by mouth at bedtime as needed for mild constipation. ) 60 each 0  . simvastatin (ZOCOR) 10 MG tablet TAKE ONE TABLET BY MOUTH AT BEDTIME 30 tablet 3  . torsemide (DEMADEX) 20 MG tablet Take 2 tablets (40 mg total) by mouth daily. Please start on 11/22/2016 60 tablet 6   No facility-administered medications prior to visit.      EXAM:  BP 104/60 (BP Location: Right Arm, Patient Position: Sitting, Cuff Size: Normal)   Pulse 73   Temp 97.4 F (36.3 C) (Oral)   Ht 4\' 10"  (1.473 m)   Wt 124 lb 12.8 oz (56.6 kg)   BMI 26.08 kg/m   Body mass index is 26.08 kg/m.  GENERAL: vitals reviewed and listed above, alert, oriented, appears well hydrated and in no acute distress here with caretaker  HEENT: atraumatic, conjunctiva  clear, no obvious abnormalities on inspection of external nose and ears Right EAC appears clear I don't see a foreign body TM appears normal to me. OP tongue appears to be midline Neck I don't hear a bruit nor extra sounds in the cranium around the year. CV: HRRR, no clubbing cyanosis or  peripheral edema nl cap refill  Ambulatory   Cognition seems intact  PSYCH: pleasant and cooperative, no obvious depression or anxiety Wt Readings from Last 3 Encounters:  04/05/17 124 lb 12.8 oz (56.6 kg)  03/30/17 127 lb 6.4 oz (57.8 kg)  03/10/17 124 lb 14.4 oz (56.7 kg)   BP  Readings from Last 3 Encounters:  04/05/17 104/60  03/30/17 112/80  03/11/17 116/90     ASSESSMENT AND PLAN:  Discussed the following assessment and plan:  Clicking tinnitus, right ? - Plan: Ambulatory  referral to ENT Still recent  sounds in right ear that she states sounds like paper sometimes wake her all and other times is pulsatile without associated obvious hearing loss and not like regular tinnitus. She is going to see neurology about the right arm jerking associates. I don't hear vascular sound and we have irrigated for years and don't see a foreign body. Next step would be referral to ENT for good evaluation hearing exam and opinion -Patient advised to return or notify health care team  if symptoms worsen ,persist or new concerns arise.  Patient Instructions  Am not sure the cause of your ear symptoms     I your ear exam  Looks ok to me  I will get a  referfal to ENT ear doctor to get a good exam and ? Next step.  You will be contacted about  Referral appt .      Standley Brooking. Shardai Star M.D.

## 2017-04-05 NOTE — Telephone Encounter (Signed)
Spoke with pt and she reports constant "banging" in her head and sometimes a sound like someone "crunching paper". She states that this is not improving but is getting more severe. She is scheduled to see Dr. Regis Bill today. Advised pt if symptoms worsen to seek ED care immediately. Nothing further needed at this time.

## 2017-04-14 ENCOUNTER — Ambulatory Visit (HOSPITAL_COMMUNITY)
Admission: RE | Admit: 2017-04-14 | Discharge: 2017-04-14 | Disposition: A | Discharge status: Home / Self Care | Payer: Medicare Other | Source: Ambulatory Visit | Attending: Internal Medicine | Admitting: Internal Medicine

## 2017-04-14 VITALS — BP 110/60 | HR 71 | Wt 123.8 lb

## 2017-04-14 DIAGNOSIS — Z85828 Personal history of other malignant neoplasm of skin: Secondary | ICD-10-CM | POA: Insufficient documentation

## 2017-04-14 DIAGNOSIS — R0789 Other chest pain: Secondary | ICD-10-CM | POA: Diagnosis not present

## 2017-04-14 DIAGNOSIS — Z950 Presence of cardiac pacemaker: Secondary | ICD-10-CM | POA: Insufficient documentation

## 2017-04-14 DIAGNOSIS — I130 Hypertensive heart and chronic kidney disease with heart failure and stage 1 through stage 4 chronic kidney disease, or unspecified chronic kidney disease: Secondary | ICD-10-CM | POA: Insufficient documentation

## 2017-04-14 DIAGNOSIS — Z7902 Long term (current) use of antithrombotics/antiplatelets: Secondary | ICD-10-CM | POA: Diagnosis not present

## 2017-04-14 DIAGNOSIS — I5032 Chronic diastolic (congestive) heart failure: Secondary | ICD-10-CM

## 2017-04-14 DIAGNOSIS — I481 Persistent atrial fibrillation: Secondary | ICD-10-CM | POA: Insufficient documentation

## 2017-04-14 DIAGNOSIS — N184 Chronic kidney disease, stage 4 (severe): Secondary | ICD-10-CM | POA: Insufficient documentation

## 2017-04-14 DIAGNOSIS — D631 Anemia in chronic kidney disease: Secondary | ICD-10-CM | POA: Diagnosis not present

## 2017-04-14 DIAGNOSIS — E785 Hyperlipidemia, unspecified: Secondary | ICD-10-CM | POA: Diagnosis not present

## 2017-04-14 DIAGNOSIS — Z7901 Long term (current) use of anticoagulants: Secondary | ICD-10-CM | POA: Diagnosis not present

## 2017-04-14 DIAGNOSIS — E039 Hypothyroidism, unspecified: Secondary | ICD-10-CM | POA: Diagnosis not present

## 2017-04-14 DIAGNOSIS — Z859 Personal history of malignant neoplasm, unspecified: Secondary | ICD-10-CM | POA: Diagnosis not present

## 2017-04-14 DIAGNOSIS — I480 Paroxysmal atrial fibrillation: Secondary | ICD-10-CM | POA: Diagnosis not present

## 2017-04-14 DIAGNOSIS — I5033 Acute on chronic diastolic (congestive) heart failure: Secondary | ICD-10-CM | POA: Insufficient documentation

## 2017-04-14 DIAGNOSIS — K219 Gastro-esophageal reflux disease without esophagitis: Secondary | ICD-10-CM | POA: Insufficient documentation

## 2017-04-14 DIAGNOSIS — R001 Bradycardia, unspecified: Secondary | ICD-10-CM | POA: Diagnosis not present

## 2017-04-14 DIAGNOSIS — I2510 Atherosclerotic heart disease of native coronary artery without angina pectoris: Secondary | ICD-10-CM | POA: Diagnosis not present

## 2017-04-14 NOTE — Progress Notes (Signed)
Patient ID: [name], female   DOB: [birth year], 83 y.o.   MRN: [id number]   Advanced Heart Failure Clinic Note   PCP: Dr Netty Starring Primary Cardiologist: Dr. Harrington Challenger Primary HF: Dr. Haroldine Laws   HPI: [name] is a 83 y.o. female with the history of nonobstructive CAD by cardiac catheterization in 01/2014, persistent atrial fibrillation, diastolic CHF, HTN, HL, anemia, and CKD. Patient was admitted 04/2014 x2 with acute on chronic diastolic CHF. She underwent cardioversion with restoration of NSR. This resulted in profound bradycardia and hypotension requiring pacemaker implantation. Pacemaker implant was complicated by acute blood loss anemia requiring transfusion with PRBCs  Admitted to [facility] August 21 with increased dyspnea. Diuresed with IV lasix and transitioned to po lasix 40 mg twice a day. GI evaluated due to anemia and black stool. Had EGD with no acute findings. Colonoscopy was cancelled per daughter due to multiple medical problems. She continued on eliquis 2.5 mg twice a day.  Palliative Care consulted and the patient and family request aggressive care for heart failure. Discharge weight was 152 pounds.   Admitted to [facility] 11/9 after RHC due to elevated filling pressures. Once diuresed she was put on torsemide 20 mg twice a day. She was also restarted on amiodarone 200 mg daily and continue on eliquis 2.5 mg twice a day. Discharge weight was 141 pounds.   Admitted 12/12 to 12/17 with recurrent HF and AF. Admit weight was 136. Was diuresed to 131. Diuresis limited by low BP. Demadex increased from 20 bid to 40 bid.   Admitted 12/06/14 - 12/09/14 for ADHF. Initial weight 134 pounds. Diuresed down to 130 and felt better. Treatment limited due to hypotension and worsening renal failure with creatinine up to 2.6. Renal u/s ok. Metolazone stopped.  Admitted in 3/16 with hypotension. Started on midodrine 2.5 bid. Demadex cut back from 40 bid to 40 daily.   Admitted 5/17 - 5/18 with  recurrent CP. Myoview normal EF > 65% no ischemia.  Admitted 05/09/15 for chest pain. Demedex decreased to 30 mg daily with worsening creatinine. Troponin flat and without trend. D/c weight 133 lb  Admitted 01/21/16 with atypical chest pain. Recent cath, stress test reviewed with her and re-inforced that it is not cardiac related. Ruled out ACS, esophageal dysmotility, normal esophagogram.  Admitted 03/08/17-03/10/17 with atypical chest pain and 7 pound weight gain. She was diuresed and discharge weight was 124 pounds.   Echo 01/22/16 LVEF 60-65%, grade 2 DD   She returns today for HF follow up. She feels well. Denies SOB and orthopnea. She weighs at home, weights running 124-125 pounds. Taking all her medications, she says that her insurance company sent her a letter that they will no longer be covering torsemide. Will check into that for her. She continues to remain fairly active.   Studies:  - LHC (3/15): Mid LM 20-30%, mid RCA 30-40%, EF 65%  - Echo (4/15): Mild LVH, EF 60-65%, no RWMA, mild BAE, trivial eff  - Echo 5/15: EF 45-50% septl HK - Carotid US (5/15): 40-59% RICA stenosis. 8-34% LICA stenosis.  - 5/16 Myoview EF > 65% normal perfusion - Cath 3/15:   Left mainstem: The mid left main has 20-30% stenosis and there is moderate calcification noted. Left anterior descending (LAD): There is mild diffuse plaquing without significant stenosis. The first diagonal is patent. Left circumflex (LCx): Left circumflex is patent. The first OM is patent without significant stenosis. Right coronary artery (RCA): This is a large, dominant vessel. The  vessel is moderately calcified. The mid vessel has 30-40% stenosis.   RHC 09/22/14  RA = 11 RV = 49/6/10 PA = 49/22 (34) PCW = 18 (v = 25) Fick cardiac output/index = 6.4/4.0 PVR = 2.5 WU FA sat = 95% PA sat = 72%,74%  Labs 9/15 K 3.5 Creatinine 1.44 Hgb 10.7  Labs 8/15 K 4.1 Creatinine 1.46  Hgb 9.8  Labs 8/15 TSH 2.7 Labs 10/15: K 4.0,  creatinine 1.93 Labs 11/15 K 4.6 Creatinine 3.9  Labs 11/15 K 3.6 Creatinine 1.61 hgb 13.7 Labs 11/15 K 4.4 Creatinine 1.7 Hgb 14/1  Labs 12/15 K 3.9 Creatinine 1.4  Labs 2/16: K 4.2 Creatinine 2.22 Labs 5/16: K 3.5 Creatinine 1.8 Labs 6/16 K 3.1 Creatinine 2.29   ROS: All systems negative except as listed in HPI, PMH and Problem List.  Past Medical History:  Diagnosis Date  . Anemia   . Atrial fibrillation ([place])    a. Dx 01/2014->Eliquis started.  . Cancer ([place])   . Chest pain    a. 2011 Neg MV;  b. 01/2014 Cath: LM 20-30, LAD nl, D1 nl, LCX nl, OM1 nl, RCA dom 30-1m, PD/PL nl, EF 65%->Med Rx; c. 03/2016 MV: no ischemia/infarct. EF 63%.  . Degenerative joint disease   . Diastolic CHF, acute on chronic ([place]) 01/31/2012  . Dyslipidemia   . Dysrhythmia   . Family history of adverse reaction to anesthesia    " multiple family members have difficulty waking "  . GERD (gastroesophageal reflux disease)   . History of skin cancer    tafeen/ non melanoma.   Marland Kitchen Hx of varicella   . Hypertension   . Hypothyroidism   . Monoclonal gammopathies   . Pacemaker   . Palpitations    PVCs/bigeminy on event in May 2009 revealing this was relatively asymptomatic  . Right lower quadrant abdominal pain 07/19/2012   Recurrent  With nausea    This is new since she had ct for llq pain in [facility]    R/o appendiceal problem  Hernia  Get ct scan  And plan  Fu     . Thrombocytopenia ([place])     Current Outpatient Prescriptions  Medication Sig Dispense Refill  . ELIQUIS 2.5 MG TABS tablet TAKE ONE TABLET BY MOUTH TWICE DAILY 60 tablet 6  . Ferrous Sulfate (IRON) 325 (65 Fe) MG TABS Take 1 tablet by mouth daily. 180 each 0  . HYDROcodone-acetaminophen (NORCO/VICODIN) 5-325 MG tablet   0  . levothyroxine (SYNTHROID, LEVOTHROID) 75 MCG tablet TAKE ONE TABLET BY MOUTH ONCE DAILY. 90 tablet 0  . loratadine (CLARITIN) 10 MG tablet Take 1 tablet (10 mg total) by mouth daily. 30 tablet 0  . meclizine (ANTIVERT) 12.5  MG tablet Take 1 tablet (12.5 mg total) by mouth 2 (two) times daily as needed for dizziness. 10 tablet 0  . metolazone (ZAROXOLYN) 2.5 MG tablet Take 1 tablet (2.5 mg total) by mouth as needed (for weight 134 lb or greater). 10 tablet 3  . nitroGLYCERIN (NITROSTAT) 0.4 MG SL tablet Place 1 tablet (0.4 mg total) under the tongue every 5 (five) minutes as needed. For chest pain 25 tablet 6  . pantoprazole (PROTONIX) 40 MG tablet Take 40 mg by mouth daily.    . polyethylene glycol (MIRALAX) packet Take 17 g by mouth daily. Mix with 8 ounces of liquid. 14 each 0  . potassium chloride SA (K-DUR,KLOR-CON) 20 MEQ tablet Take 2 tablets (40 mEq total) by mouth daily. Take an  additional 39meq with Metolazone. 90 tablet 2  . senna (SENOKOT) 8.6 MG TABS tablet Take 2 tablets (17.2 mg total) by mouth at bedtime. (Patient taking differently: Take 2 tablets by mouth at bedtime as needed for mild constipation. ) 60 each 0  . simvastatin (ZOCOR) 10 MG tablet TAKE ONE TABLET BY MOUTH AT BEDTIME 30 tablet 3  . torsemide (DEMADEX) 20 MG tablet Take 2 tablets (40 mg total) by mouth daily. Please start on 11/22/2016 60 tablet 6  . amiodarone (PACERONE) 200 MG tablet Take 1 tablet (200 mg total) by mouth daily. 90 tablet 3  . denosumab (PROLIA) 60 MG/ML SOLN injection Inject 60 mg into the skin every 6 (six) months. Administer in upper arm, thigh, or abdomen     No current facility-administered medications for this encounter.    Vitals:   04/14/17 1455  BP: 110/60  Pulse: 71  SpO2: 94%  Weight: 123 lb 12.8 oz (56.2 kg)    Wt Readings from Last 3 Encounters:  04/14/17 123 lb 12.8 oz (56.2 kg)  04/05/17 124 lb 12.8 oz (56.6 kg)  03/30/17 127 lb 6.4 oz (57.8 kg)     PHYSICAL EXAM: General:  Elderly female, arrived in wheelchair. Caregiver present at visit.  HEENT: Normal Neck: supple. 5-6 cm JVP. Carotids 2+ bilaterally; no bruits. No thyromegaly or nodule noted. Cor: PMI normal. Regular rate and rhythm. Soft  diastolic murmur.  Lungs: Clear to auscultation bilaterally. Normal effort.  Abdomen: soft, NT, ND, no HSM. No bruits or masses. Bowel sounds present.  Extremities: no cyanosis, clubbing, rash, R and LLE severe varicose veins. No edema.  Neuro: alert & orientedx3, cranial nerves grossly intact. Moves all 4 extremities w/o difficulty. Affect pleasant.   ASSESSMENT & PLAN:  1. Chronic Diastolic Heart Failure - NYHA III  - Volume status stable. Continue torsemide 40mg  daily. We will contact her insurance company to make sure she gets this.  - Continue to take metolazone (and KCL 20) only as needed for weight 134 or greater.  - Reinforced the need and importance of daily weights, a low sodium diet, and fluid restriction (less than 2 L a day). Instructed to call the HF clinic if weight increases more than 3 lbs overnight or 5 lbs in a week.  - BMET today.  2.PAF - symptomatic bradycardia s/p PPM.   - Continue Amio 200mg  daily.  - Rate regular today.  - continue Eliquis for anticoagulation     3. CKD stage IV - BMET today.  4. Deconditioning- Continue to increase activity as able.  5. Chest pain, atypical  - resolved. Stress test normal 5/16. Minimal CAD on cath 3/15 - Denies chest pain today.   Follow up in 6 months.   Arbutus Leas , NP-C 3:06 PM   Patient seen and examined with Jettie Booze, NP. We discussed all aspects of the encounter. I agree with the assessment and plan as stated above.   Volume status looks good. Maintaining NSR on amio. Tolerating Eliquis without bleeding. Chest pain is quiescent. Continue current regimen.   Glori Bickers, MD  10:21 PM

## 2017-04-14 NOTE — Patient Instructions (Signed)
We will contact you in 6 months to schedule your next appointment.  

## 2017-04-17 ENCOUNTER — Telehealth (HOSPITAL_COMMUNITY): Payer: Self-pay | Admitting: *Deleted

## 2017-04-17 NOTE — Telephone Encounter (Signed)
Completed non-formulary exception request through [facility] via [place] on Fri 6/1, med was approved through 04/14/18, left VM for pt that this was approved

## 2017-04-25 ENCOUNTER — Ambulatory Visit (INDEPENDENT_AMBULATORY_CARE_PROVIDER_SITE_OTHER): Payer: Medicare Other | Admitting: *Deleted

## 2017-04-25 DIAGNOSIS — I480 Paroxysmal atrial fibrillation: Secondary | ICD-10-CM

## 2017-04-25 NOTE — Progress Notes (Signed)
Remote pacemaker transmission.   

## 2017-04-26 ENCOUNTER — Encounter: Payer: Self-pay | Admitting: Cardiology

## 2017-04-26 LAB — CUP PACEART REMOTE DEVICE CHECK
Battery Remaining Longevity: 109 mo
Battery Remaining Percentage: 95.5 %
Brady Statistic AP VS Percent: 1 %
Brady Statistic AS VP Percent: 1 %
Brady Statistic AS VS Percent: 1 %
Brady Statistic RV Percent Paced: 93 %
Date Time Interrogation Session: 20180612060013
Implantable Lead Implant Date: 20150610
Implantable Lead Location: 753859
Implantable Lead Model: 5076
Implantable Lead Model: 5076
Implantable Pulse Generator Implant Date: 20150610
Lead Channel Impedance Value: 460 Ohm
Lead Channel Pacing Threshold Amplitude: 1.25 V
Lead Channel Pacing Threshold Pulse Width: 0.4 ms
Lead Channel Sensing Intrinsic Amplitude: 12 mV
Lead Channel Sensing Intrinsic Amplitude: 4.2 mV
Lead Channel Setting Pacing Amplitude: 2 V
Lead Channel Setting Pacing Amplitude: 2.5 V
Lead Channel Setting Pacing Pulse Width: 0.4 ms
MDC IDC LEAD IMPLANT DT: 20150610
MDC IDC LEAD LOCATION: 753860
MDC IDC MSMT BATTERY VOLTAGE: 2.98 V
MDC IDC MSMT LEADCHNL RA PACING THRESHOLD AMPLITUDE: 0.75 V
MDC IDC MSMT LEADCHNL RA PACING THRESHOLD PULSEWIDTH: 0.4 ms
MDC IDC MSMT LEADCHNL RV IMPEDANCE VALUE: 510 Ohm
MDC IDC SET LEADCHNL RV SENSING SENSITIVITY: 2 mV
MDC IDC STAT BRADY AP VP PERCENT: 99 %
MDC IDC STAT BRADY RA PERCENT PACED: 8.7 %
Pulse Gen Model: 2240
Pulse Gen Serial Number: 7627922

## 2017-04-29 NOTE — Progress Notes (Deleted)
No chief complaint on file.   HPI: [name] 83 y.o. come in for Chronic disease management   Was 2 mos fu but has been seen for  jerling r arm and clickin tinnitus for then and referred for ecnt and neuro for opinoino ROS: See pertinent positives and negatives per HPI.  Past Medical History:  Diagnosis Date  . Anemia   . Atrial fibrillation ([place])    a. Dx 01/2014->Eliquis started.  . Cancer ([place])   . Chest pain    a. 2011 Neg MV;  b. 01/2014 Cath: LM 20-30, LAD nl, D1 nl, LCX nl, OM1 nl, RCA dom 30-86m, PD/PL nl, EF 65%->Med Rx; c. 03/2016 MV: no ischemia/infarct. EF 63%.  . Degenerative joint disease   . Diastolic CHF, acute on chronic ([place]) 01/31/2012  . Dyslipidemia   . Dysrhythmia   . Family history of adverse reaction to anesthesia    " multiple family members have difficulty waking "  . GERD (gastroesophageal reflux disease)   . History of skin cancer    tafeen/ non melanoma.   Marland Kitchen Hx of varicella   . Hypertension   . Hypothyroidism   . Monoclonal gammopathies   . Pacemaker   . Palpitations    PVCs/bigeminy on event in May 2009 revealing this was relatively asymptomatic  . Right lower quadrant abdominal pain 07/19/2012   Recurrent  With nausea    This is new since she had ct for llq pain in [facility]    R/o appendiceal problem  Hernia  Get ct scan  And plan  Fu     . Thrombocytopenia ([place])     Family History  Problem Relation Age of Onset  . Coronary artery disease Father   . Heart disease Father   . Heart attack Father   . Alzheimer's disease Mother   . Lung cancer Brother 8  . Lung cancer Unknown     Social History   Social History  . Marital status: Widowed    Spouse name: N/A  . Number of children: 3  . Years of education: N/A   Occupational History  .  Retired    [employer], book keeping   Social History Main Topics  . Smoking status: Never Smoker  . Smokeless tobacco: Never Used  . Alcohol use No  . Drug use: No  . Sexual activity: No    Other Topics Concern  . 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 Eats with her.      Daughter Joseph Art handles medications. Sees HH and PT once a week.   Helper  [place]  there Friday 1-3 pm    Outpatient Medications Prior to Visit  Medication Sig Dispense Refill  . amiodarone (PACERONE) 200 MG tablet Take 1 tablet (200 mg total) by mouth daily. 90 tablet 3  . denosumab (PROLIA) 60 MG/ML SOLN injection Inject 60 mg into the skin every 6 (six) months. Administer in upper arm, thigh, or abdomen    . ELIQUIS 2.5 MG TABS tablet TAKE ONE TABLET BY MOUTH TWICE DAILY 60 tablet 6  . Ferrous Sulfate (IRON) 325 (65 Fe) MG TABS Take  1 tablet by mouth daily. 180 each 0  . HYDROcodone-acetaminophen (NORCO/VICODIN) 5-325 MG tablet   0  . levothyroxine (SYNTHROID, LEVOTHROID) 75 MCG tablet TAKE ONE TABLET BY MOUTH ONCE DAILY. 90 tablet 0  . loratadine (CLARITIN) 10 MG tablet Take 1 tablet (10 mg total) by mouth daily. 30 tablet 0  . meclizine (ANTIVERT) 12.5 MG tablet Take 1 tablet (12.5 mg total) by mouth 2 (two) times daily as needed for dizziness. 10 tablet 0  . metolazone (ZAROXOLYN) 2.5 MG tablet Take 1 tablet (2.5 mg total) by mouth as needed (for weight 134 lb or greater). 10 tablet 3  . nitroGLYCERIN (NITROSTAT) 0.4 MG SL tablet Place 1 tablet (0.4 mg total) under the tongue every 5 (five) minutes as needed. For chest pain 25 tablet 6  . pantoprazole (PROTONIX) 40 MG tablet Take 40 mg by mouth daily.    . polyethylene glycol (MIRALAX) packet Take 17 g by mouth daily. Mix with 8 ounces of liquid. 14 each 0  . potassium chloride SA (K-DUR,KLOR-CON) 20 MEQ tablet Take 2 tablets (40 mEq total) by mouth daily. Take an additional 24meq with Metolazone. 90 tablet 2  . senna  (SENOKOT) 8.6 MG TABS tablet Take 2 tablets (17.2 mg total) by mouth at bedtime. (Patient taking differently: Take 2 tablets by mouth at bedtime as needed for mild constipation. ) 60 each 0  . simvastatin (ZOCOR) 10 MG tablet TAKE ONE TABLET BY MOUTH AT BEDTIME 30 tablet 3  . torsemide (DEMADEX) 20 MG tablet Take 2 tablets (40 mg total) by mouth daily. Please start on 11/22/2016 60 tablet 6   No facility-administered medications prior to visit.      EXAM:  There were no vitals taken for this visit.  There is no height or weight on file to calculate BMI.  GENERAL: vitals reviewed and listed above, alert, oriented, appears well hydrated and in no acute distress HEENT: atraumatic, conjunctiva  clear, no obvious abnormalities on inspection of external nose and ears OP : no lesion edema or exudate  NECK: no obvious masses on inspection palpation  LUNGS: clear to auscultation bilaterally, no wheezes, rales or rhonchi, good air movement CV: HRRR, no clubbing cyanosis or  peripheral edema nl cap refill  MS: moves all extremities without noticeable focal  abnormality PSYCH: pleasant and cooperative, no obvious depression or anxiety Lab Results  Component Value Date   WBC 5.5 03/11/2017   HGB 13.1 03/11/2017   HCT 40.1 03/11/2017   PLT 124 (L) 03/11/2017   GLUCOSE 101 (H) 03/11/2017   CHOL 149 03/09/2017   TRIG 63 03/09/2017   HDL 84 03/09/2017   LDLCALC 52 03/09/2017   ALT 18 03/11/2017   AST 22 03/11/2017   NA 138 03/11/2017   K 3.9 03/11/2017   CL 111 03/11/2017   CREATININE 1.72 (H) 03/11/2017   BUN 27 (H) 03/11/2017   CO2 20 (L) 03/11/2017   TSH 3.28 02/27/2017   INR 1.23 03/08/2017   BP Readings from Last 3 Encounters:  04/14/17 110/60  04/05/17 104/60  03/30/17 112/80   Wt Readings from Last 3 Encounters:  04/14/17 123 lb 12.8 oz (56.2 kg)  04/05/17 124 lb 12.8 oz (56.6 kg)  03/30/17 127 lb 6.4 oz (57.8 kg)    ASSESSMENT AND PLAN:  Discussed the following assessment  and plan:  No diagnosis found. Cr 1.7  In April  -Patient advised to return or notify health care team  if  new concerns arise.  There  are no Patient Instructions on file for this visit.   Standley Brooking. Akbar Sacra M.D.

## 2017-05-01 ENCOUNTER — Ambulatory Visit: Payer: Medicare Other | Admitting: Internal Medicine

## 2017-05-01 DIAGNOSIS — Z0289 Encounter for other administrative examinations: Secondary | ICD-10-CM

## 2017-05-02 ENCOUNTER — Telehealth: Payer: Self-pay | Admitting: Internal Medicine

## 2017-05-02 NOTE — Telephone Encounter (Signed)
New message    Pt is calling about her pacer. She said the area is raised going from the right shoulder to the middle of the breast bone. She said like a varicose vein without blood. She is concerned please call.

## 2017-05-03 NOTE — Telephone Encounter (Signed)
Late entry:  Spoke to patient about device site. Patient states that her device protrudes more than it used to and it appears to have moved more towards the center of her chest. Patient denies any ecchymosis or edema. She denies any fever or chills. She states that the device does not flip over at all. She does admit to losing a lot of weight since it was initially implanted. I told her that what she was describing sounded like normal migration of the device, but I told her if she wanted to come into the office to have it assessed then she could. Patient states that she would feel more comfortable with an appt. Appt scheduled for 6/22 @ 1100. Patient verbalized understanding.

## 2017-05-05 ENCOUNTER — Ambulatory Visit (INDEPENDENT_AMBULATORY_CARE_PROVIDER_SITE_OTHER): Payer: Self-pay | Admitting: *Deleted

## 2017-05-05 DIAGNOSIS — I480 Paroxysmal atrial fibrillation: Secondary | ICD-10-CM

## 2017-05-08 LAB — CBC AND DIFFERENTIAL
HCT: 45 (ref 36–46)
Hemoglobin: 14.7 (ref 12.0–16.0)
Neutrophils Absolute: 3597
Platelets: 127 — AB (ref 150–399)
WBC: 4.9

## 2017-05-08 LAB — BASIC METABOLIC PANEL
BUN: 45 — AB (ref 4–21)
Creatinine: 2.2 — AB (ref <=1.1)
GLUCOSE: 159
POTASSIUM: 4.8 (ref 3.4–5.3)
SODIUM: 140 (ref 137–147)

## 2017-05-08 LAB — HEPATIC FUNCTION PANEL
ALT: 17 (ref 7–35)
AST: 20 (ref 13–35)
Alkaline Phosphatase: 68 (ref 25–125)
BILIRUBIN, TOTAL: 0.9

## 2017-05-08 NOTE — Progress Notes (Signed)
Patient presents to the office for a ppm site check d/t ?protrusion of the device. Pacemaker site assessed. Site without redness or edema. Skin surrounding pacemaker is healthy and appropriate color for race. Patient educated about signs and symptoms of unhealthy ppm site. Patient encouraged to call for any changes in the site. Patient verbalized understanding. Patient to follow up as scheduled.

## 2017-05-11 ENCOUNTER — Encounter: Payer: Self-pay | Admitting: Cardiology

## 2017-05-15 ENCOUNTER — Ambulatory Visit (INDEPENDENT_AMBULATORY_CARE_PROVIDER_SITE_OTHER): Payer: Medicare Other | Admitting: Diagnostic Neuroimaging

## 2017-05-15 ENCOUNTER — Encounter: Payer: Self-pay | Admitting: Diagnostic Neuroimaging

## 2017-05-15 VITALS — BP 125/61 | HR 71 | Ht <= 58 in | Wt 124.4 lb

## 2017-05-15 DIAGNOSIS — F0390 Unspecified dementia without behavioral disturbance: Secondary | ICD-10-CM

## 2017-05-15 DIAGNOSIS — G253 Myoclonus: Secondary | ICD-10-CM | POA: Diagnosis not present

## 2017-05-15 DIAGNOSIS — F03A0 Unspecified dementia, mild, without behavioral disturbance, psychotic disturbance, mood disturbance, and anxiety: Secondary | ICD-10-CM

## 2017-05-15 DIAGNOSIS — M47892 Other spondylosis, cervical region: Secondary | ICD-10-CM

## 2017-05-15 NOTE — Progress Notes (Signed)
[facility]  PATIENT: [name] DOB: 1925-03-22  REFERRING CLINICIAN: W Panosh HISTORY FROM: patient and daughter  REASON FOR VISIT: new consult    HISTORICAL  CHIEF COMPLAINT:  Chief Complaint  Patient presents with  . NP Panosh  . Memory Loss    Had episode 2 months ago confusion), and R neck clicking, when turning neck, Has had previous CT (has Psychologist, forensic. No pain,  Daughter, Joseph Art with pt.    . Altered Mental Status    HISTORY OF PRESENT ILLNESS:   83 year old female with atrial fibrillation, heart disease, skin cancer, CHF, pacemaker, here for evaluation of memory loss, confusion, hallucinations, clicking noise in the head, abnormal jerking movements of the hand.  In April or May 2018 patient was at lunch with her daughter, when during the lunch patient was somewhat poorly responsive, closing her eyes, having intermittent jerking movements of her arms. I reviewed the video that patient's daughter recorded of this event. During the event patient is able to slightly respond to her daughter verbally but she tends to close her eyes, have subtle jerks intermittently in the left greater than right arm. Patient was apparently spilling her food and drink during lunch. She was somewhat poorly responsive the rest of the day but returned to baseline the next day. Just before lunch patient was doing well. Patient's daughter raised the possibility of whether depression and family stress could be related to these abnormal movements. Patient is estranged from another daughter, those grandchildren, and the son-in-law, related to financial and legal matters.  Patient also has had one to 2 years of gradual onset and progressive short-term memory loss confusion. Patient also has had episodes of paranoia and hallucinations. Patient lives alone but is supported by her daughter Debroah Baller) and several neighbors. Patient no longer drives a car. She has Meals [employer] Wheels delivery.  She has other assistance of caregivers that help her with some of her household needs and ADLs.  Over past 1-2 years patient has also noticed a intermittent "clicking" and "rice crispy crunching" sound in her head especially when she turns her head either to the left, the right or backwards. She only hears the sound if she turns her head. She never hears a sound at rest. Sometimes she is able to move her head without hearing the sound. She has some mild neck pain. I asked patient to reproduce the sound in the office and she was able to find certain positions of head movement where an individual clinic would occur. This does not occur on a rhythmic basis. Symptoms last at the most 30 seconds to 1 minute at a time, but only while her head is moving.    REVIEW OF SYSTEMS: Full 14 system review of systems performed and negative with exception of: Memory loss confusion headache too much sleep easily bleeding shortness of breath swelling in legs hearing loss weight loss fatigue.   ALLERGIES: Allergies  Allergen Reactions  . Other Shortness Of Breath    Detergents, perfumes and other strong odor emitting compounds  . Tramadol Shortness Of Breath and Nausea Only  . Ibuprofen Other (See Comments)    Told not to take med    HOME MEDICATIONS: Outpatient Medications Prior to Visit  Medication Sig Dispense Refill  . amiodarone (PACERONE) 200 MG tablet Take 1 tablet (200 mg total) by mouth daily. 90 tablet 3  . denosumab (PROLIA) 60 MG/ML SOLN injection Inject 60 mg into the skin every 6 (six) months. Administer  in upper arm, thigh, or abdomen    . ELIQUIS 2.5 MG TABS tablet TAKE ONE TABLET BY MOUTH TWICE DAILY 60 tablet 6  . Ferrous Sulfate (IRON) 325 (65 Fe) MG TABS Take 1 tablet by mouth daily. 180 each 0  . HYDROcodone-acetaminophen (NORCO/VICODIN) 5-325 MG tablet   0  . levothyroxine (SYNTHROID, LEVOTHROID) 75 MCG tablet TAKE ONE TABLET BY MOUTH ONCE DAILY. 90 tablet 0  . loratadine (CLARITIN) 10  MG tablet Take 1 tablet (10 mg total) by mouth daily. 30 tablet 0  . meclizine (ANTIVERT) 12.5 MG tablet Take 1 tablet (12.5 mg total) by mouth 2 (two) times daily as needed for dizziness. 10 tablet 0  . metolazone (ZAROXOLYN) 2.5 MG tablet Take 1 tablet (2.5 mg total) by mouth as needed (for weight 134 lb or greater). 10 tablet 3  . nitroGLYCERIN (NITROSTAT) 0.4 MG SL tablet Place 1 tablet (0.4 mg total) under the tongue every 5 (five) minutes as needed. For chest pain 25 tablet 6  . pantoprazole (PROTONIX) 40 MG tablet Take 40 mg by mouth daily.    . polyethylene glycol (MIRALAX) packet Take 17 g by mouth daily. Mix with 8 ounces of liquid. 14 each 0  . potassium chloride SA (K-DUR,KLOR-CON) 20 MEQ tablet Take 2 tablets (40 mEq total) by mouth daily. Take an additional 65meq with Metolazone. 90 tablet 2  . senna (SENOKOT) 8.6 MG TABS tablet Take 2 tablets (17.2 mg total) by mouth at bedtime. (Patient taking differently: Take 2 tablets by mouth at bedtime as needed for mild constipation. ) 60 each 0  . simvastatin (ZOCOR) 10 MG tablet TAKE ONE TABLET BY MOUTH AT BEDTIME 30 tablet 3  . torsemide (DEMADEX) 20 MG tablet Take 2 tablets (40 mg total) by mouth daily. Please start on 11/22/2016 60 tablet 6   No facility-administered medications prior to visit.     PAST MEDICAL HISTORY: Past Medical History:  Diagnosis Date  . Anemia   . Atrial fibrillation ([place])    a. Dx 01/2014->Eliquis started.  . Cancer ([place])   . Chest pain    a. 2011 Neg MV;  b. 01/2014 Cath: LM 20-30, LAD nl, D1 nl, LCX nl, OM1 nl, RCA dom 30-54m, PD/PL nl, EF 65%->Med Rx; c. 03/2016 MV: no ischemia/infarct. EF 63%.  . Degenerative joint disease   . Diastolic CHF, acute on chronic ([place]) 01/31/2012  . Dyslipidemia   . Dysrhythmia   . Family history of adverse reaction to anesthesia    " multiple family members have difficulty waking "  . GERD (gastroesophageal reflux disease)   . History of skin cancer    tafeen/ non melanoma.    Marland Kitchen Hx of varicella   . Hypertension   . Hypothyroidism   . Monoclonal gammopathies   . Pacemaker   . Palpitations    PVCs/bigeminy on event in May 2009 revealing this was relatively asymptomatic  . Right lower quadrant abdominal pain 07/19/2012   Recurrent  With nausea    This is new since she had ct for llq pain in [facility]    R/o appendiceal problem  Hernia  Get ct scan  And plan  Fu     . Thrombocytopenia ([place])     PAST SURGICAL HISTORY: Past Surgical History:  Procedure Laterality Date  . CARDIOVERSION N/A 02/26/2014   Procedure: CARDIOVERSION AT BEDSIDE;  Surgeon: Pixie Casino, MD;  Location: [facility] [place];  Service: Cardiovascular;  Laterality: N/A;  . CARDIOVERSION N/A 04/22/2014  Procedure: CARDIOVERSION (BEDSIDE);  Surgeon: [name], MD;  Location: [facility] OR;  Service: Cardiovascular;  Laterality: N/A;  . CATARACT EXTRACTION     Bilateral  implantt  . CESAREAN SECTION     times 2  . CHOLECYSTECTOMY  1999  . ESOPHAGOGASTRODUODENOSCOPY N/A 07/06/2014   Procedure: ESOPHAGOGASTRODUODENOSCOPY (EGD);  Surgeon: Ladene Artist, MD;  Location: [facility] ENDOSCOPY;  Service: Endoscopy;  Laterality: N/A;  . INSERT / REPLACE / REMOVE PACEMAKER    . LEFT HEART CATHETERIZATION WITH CORONARY ANGIOGRAM N/A 01/29/2014   Procedure: LEFT HEART CATHETERIZATION WITH CORONARY ANGIOGRAM;  Surgeon: Blane Ohara, MD;  Location: [facility] CATH LAB;  Service: Cardiovascular;  Laterality: N/A;  . PACEMAKER INSERTION  04/23/2014   STJ Assurity dual chamber pacemaker implanted by Dr Rayann Heman  . PERMANENT PACEMAKER INSERTION N/A 04/23/2014   Procedure: PERMANENT PACEMAKER INSERTION;  Surgeon: Evans Lance, MD;  Location: [facility] CATH LAB;  Service: Cardiovascular;  Laterality: N/A;  . RIGHT HEART CATHETERIZATION N/A 09/22/2014   Procedure: RIGHT HEART CATH;  Surgeon: Jolaine Artist, MD;  Location: [facility] CATH LAB;  Service: Cardiovascular;  Laterality: N/A;  . SHOULDER SURGERY  1996   Right     FAMILY HISTORY: Family  History  Problem Relation Age of Onset  . Coronary artery disease Father   . Heart disease Father   . Heart attack Father   . Alzheimer's disease Mother   . Lung cancer Brother 43  . Lung cancer Unknown     SOCIAL HISTORY:  Social History   Social History  . Marital status: Widowed    Spouse name: N/A  . Number of children: 3  . Years of education: N/A   Occupational History  .  Retired    [employer], book keeping   Social History Main Topics  . Smoking status: Never Smoker  . Smokeless tobacco: Never Used  . Alcohol use No  . Drug use: No  . Sexual activity: No   Other Topics Concern  . 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 Eats with her.      Daughter Joseph Art handles medications. Sees HH and PT once a week.   Helper  [place]  there Friday 1-3 pm   Pt lives alone, Joseph Art lives 30 min away.  05-15-17     PHYSICAL EXAM  GENERAL EXAM/CONSTITUTIONAL: Vitals:  Vitals:   05/15/17 1445  BP: 125/61  Pulse: 71  Weight: 124 lb 6.4 oz (56.4 kg)  Height: 4\' 10"  (1.473 m)     Body mass index is 26 kg/m.  No exam data present  Patient is in no distress; well developed, nourished and groomed; neck is supple  CARDIOVASCULAR:  Examination of carotid arteries is normal; no carotid bruits  Regular rate and rhythm, no murmurs  Examination of peripheral vascular system by observation and palpation is normal  EYES:  Ophthalmoscopic exam of optic discs and posterior segments is normal; no papilledema or hemorrhages  MUSCULOSKELETAL:  Gait, strength, tone, movements noted in Neurologic exam below  NEUROLOGIC: MENTAL STATUS:  MMSE - Mini Mental State Exam 05/15/2017  Orientation to time 4  Orientation to Place 4  Registration 3    Attention/ Calculation 4  Recall 1  Language- name 2 objects 2  Language- repeat 1  Language- follow 3 step command 3  Language- read & follow direction 1  Write a sentence 1  Copy design 0  Total score 24    awake, alert, oriented to person, place and time; INCORRECT DAY [facility])  DECR RECALL; remote memory intact  DECR attention and concentration  language fluent, comprehension intact, naming intact,   fund of knowledge appropriate  CRANIAL NERVE:   2nd - no papilledema on fundoscopic exam  2nd, 3rd, 4th, 6th - pupils equal and reactive to light, visual fields full to confrontation, extraocular muscles intact, no nystagmus  5th - facial sensation symmetric  7th - facial strength symmetric  8th - hearing intact  9th - palate elevates symmetrically, uvula midline  11th - shoulder shrug symmetric  12th - tongue protrusion midline  MOTOR:   normal bulk and tone, full strength in the BUE, BLE  SENSORY:   normal and symmetric to light touch, temperature, vibration  COORDINATION:   finger-nose-finger, fine finger movements normal  REFLEXES:   deep tendon reflexes TRACE and symmetric  GAIT/STATION:   UNSTEADY TO RISE; SLOW STOOPED POSTURE; SEVERE KYPHOSIS    DIAGNOSTIC DATA (LABS, IMAGING, TESTING) - I reviewed patient records, labs, notes, testing and imaging myself where available.  Lab Results  Component Value Date   WBC 5.5 03/11/2017   HGB 13.1 03/11/2017   HCT 40.1 03/11/2017   MCV 95.2 03/11/2017   PLT 124 (L) 03/11/2017      Component Value Date/Time   NA 138 03/11/2017 0846   NA 142 07/30/2013 1528   K 3.9 03/11/2017 0846   K 4.3 07/30/2013 1528   CL 111 03/11/2017 0846   CL 107 01/11/2013 1248   CO2 20 (L) 03/11/2017 0846   CO2 28 07/30/2013 1528   GLUCOSE 101 (H) 03/11/2017 0846   GLUCOSE 131 07/30/2013 1528   GLUCOSE 94 01/11/2013 1248   BUN 27 (H) 03/11/2017 0846   BUN 21.4 07/30/2013 1528   CREATININE 1.72 (H)  03/11/2017 0846   CREATININE 2.63 (H) 04/04/2016 0856   CREATININE 1.0 07/30/2013 1528   CALCIUM 8.2 (L) 03/11/2017 0846   CALCIUM 9.9 07/30/2013 1528   PROT 6.2 (L) 03/11/2017 0846   PROT 6.6 07/30/2013 1528   ALBUMIN 3.5 03/11/2017 0846   ALBUMIN 3.3 (L) 07/30/2013 1528   AST 22 03/11/2017 0846   AST 15 07/30/2013 1528   ALT 18 03/11/2017 0846   ALT 12 07/30/2013 1528   ALKPHOS 64 03/11/2017 0846   ALKPHOS 89 07/30/2013 1528   BILITOT 0.6 03/11/2017 0846   BILITOT 0.58 07/30/2013 1528   GFRNONAA 25 (L) 03/11/2017 0846   GFRNONAA 16 (L) 04/04/2016 0856   GFRAA 29 (L) 03/11/2017 0846   GFRAA 18 (L) 04/04/2016 0856   Lab Results  Component Value Date   CHOL 149 03/09/2017   HDL 84 03/09/2017   LDLCALC 52 03/09/2017   TRIG 63 03/09/2017   CHOLHDL 1.8 03/09/2017   No results found for: HGBA1C Lab Results  Component Value Date   VITAMINB12 386 06/27/2014   Lab Results  Component Value Date   TSH 3.28 02/27/2017    01/27/17 CT head [I reviewed images myself and agree with interpretation. -VRP]  1. No acute intracranial process. 2. Stable atrophy with moderate chronic microvascular ischemic disease.     ASSESSMENT AND PLAN  83 y.o. year old female  here with:  1. Intermittent clicking sounds with head movement, likely crepitus related to his moderate cervical degenerative spine disease and facet disease. 2. Mild memory loss and confusion with hallucinations likely related to mild neurodegenerative dementia. 3. Single episode of muscle jerks and confusion, possibly related to hypotension, hypoglycemia, fatigue, depression, conversion reaction or other cause.   Dx:  1. Other osteoarthritis of spine, cervical region   2. Mild dementia   3. Myoclonus      PLAN:  - clicking sound with head movements --> cervical spine disease and facet disease --> conservative mgmt  - memory loss and confusion --> mild dementia --> continue safety and supervision  - muscle  jerks and confusion --> I reviewed video and this looks atypical for seizure; could be related to hypotension or hypoglycemia or hypnagogic myoclonus (benign) or conversion reaction; now resolved; monitor or go to ER if any recurrence  Return in about 6 months (around 11/15/2017).    Penni Bombard, MD 0/06/222, 3:61 PM Certified in Neurology, Neurophysiology and Neuroimaging  [facility] [address] [place], [place] [address] [phone]

## 2017-05-20 ENCOUNTER — Other Ambulatory Visit: Payer: Self-pay | Admitting: Internal Medicine

## 2017-05-20 ENCOUNTER — Other Ambulatory Visit (HOSPITAL_COMMUNITY): Payer: Self-pay | Admitting: Internal Medicine

## 2017-05-27 ENCOUNTER — Inpatient Hospital Stay (HOSPITAL_COMMUNITY): Payer: Medicare Other

## 2017-05-27 ENCOUNTER — Inpatient Hospital Stay (HOSPITAL_COMMUNITY)
Admission: EM | Admit: 2017-05-27 | Discharge: 2017-06-01 | DRG: 291 | Disposition: A | Discharge status: Skilled Nursing Facility | Payer: Medicare Other | Attending: Internal Medicine | Admitting: Internal Medicine

## 2017-05-27 ENCOUNTER — Emergency Department (HOSPITAL_COMMUNITY): Payer: Medicare Other

## 2017-05-27 ENCOUNTER — Encounter (HOSPITAL_COMMUNITY): Payer: Self-pay | Admitting: Emergency Medicine

## 2017-05-27 DIAGNOSIS — I248 Other forms of acute ischemic heart disease: Secondary | ICD-10-CM | POA: Diagnosis present

## 2017-05-27 DIAGNOSIS — I4891 Unspecified atrial fibrillation: Secondary | ICD-10-CM | POA: Diagnosis present

## 2017-05-27 DIAGNOSIS — M1990 Unspecified osteoarthritis, unspecified site: Secondary | ICD-10-CM | POA: Diagnosis present

## 2017-05-27 DIAGNOSIS — F0390 Unspecified dementia without behavioral disturbance: Secondary | ICD-10-CM | POA: Diagnosis present

## 2017-05-27 DIAGNOSIS — E039 Hypothyroidism, unspecified: Secondary | ICD-10-CM | POA: Diagnosis present

## 2017-05-27 DIAGNOSIS — Z9181 History of falling: Secondary | ICD-10-CM | POA: Diagnosis not present

## 2017-05-27 DIAGNOSIS — R7989 Other specified abnormal findings of blood chemistry: Secondary | ICD-10-CM

## 2017-05-27 DIAGNOSIS — Z79891 Long term (current) use of opiate analgesic: Secondary | ICD-10-CM

## 2017-05-27 DIAGNOSIS — Z85820 Personal history of malignant melanoma of skin: Secondary | ICD-10-CM | POA: Diagnosis not present

## 2017-05-27 DIAGNOSIS — R0603 Acute respiratory distress: Secondary | ICD-10-CM | POA: Diagnosis present

## 2017-05-27 DIAGNOSIS — N184 Chronic kidney disease, stage 4 (severe): Secondary | ICD-10-CM | POA: Diagnosis present

## 2017-05-27 DIAGNOSIS — R0902 Hypoxemia: Secondary | ICD-10-CM | POA: Diagnosis present

## 2017-05-27 DIAGNOSIS — R748 Abnormal levels of other serum enzymes: Secondary | ICD-10-CM

## 2017-05-27 DIAGNOSIS — G9341 Metabolic encephalopathy: Secondary | ICD-10-CM | POA: Diagnosis present

## 2017-05-27 DIAGNOSIS — Z885 Allergy status to narcotic agent status: Secondary | ICD-10-CM

## 2017-05-27 DIAGNOSIS — D472 Monoclonal gammopathy: Secondary | ICD-10-CM | POA: Diagnosis present

## 2017-05-27 DIAGNOSIS — I080 Rheumatic disorders of both mitral and aortic valves: Secondary | ICD-10-CM | POA: Diagnosis present

## 2017-05-27 DIAGNOSIS — R5381 Other malaise: Secondary | ICD-10-CM | POA: Diagnosis present

## 2017-05-27 DIAGNOSIS — Z950 Presence of cardiac pacemaker: Secondary | ICD-10-CM

## 2017-05-27 DIAGNOSIS — I509 Heart failure, unspecified: Secondary | ICD-10-CM

## 2017-05-27 DIAGNOSIS — D696 Thrombocytopenia, unspecified: Secondary | ICD-10-CM | POA: Diagnosis present

## 2017-05-27 DIAGNOSIS — Z801 Family history of malignant neoplasm of trachea, bronchus and lung: Secondary | ICD-10-CM

## 2017-05-27 DIAGNOSIS — Z79899 Other long term (current) drug therapy: Secondary | ICD-10-CM

## 2017-05-27 DIAGNOSIS — R339 Retention of urine, unspecified: Secondary | ICD-10-CM | POA: Diagnosis present

## 2017-05-27 DIAGNOSIS — I130 Hypertensive heart and chronic kidney disease with heart failure and stage 1 through stage 4 chronic kidney disease, or unspecified chronic kidney disease: Secondary | ICD-10-CM | POA: Diagnosis present

## 2017-05-27 DIAGNOSIS — R0602 Shortness of breath: Secondary | ICD-10-CM | POA: Diagnosis present

## 2017-05-27 DIAGNOSIS — Z7401 Bed confinement status: Secondary | ICD-10-CM | POA: Diagnosis not present

## 2017-05-27 DIAGNOSIS — E785 Hyperlipidemia, unspecified: Secondary | ICD-10-CM | POA: Diagnosis present

## 2017-05-27 DIAGNOSIS — Z820 Family history of epilepsy and other diseases of the nervous system: Secondary | ICD-10-CM

## 2017-05-27 DIAGNOSIS — W19XXXA Unspecified fall, initial encounter: Secondary | ICD-10-CM | POA: Diagnosis present

## 2017-05-27 DIAGNOSIS — Z66 Do not resuscitate: Secondary | ICD-10-CM | POA: Diagnosis present

## 2017-05-27 DIAGNOSIS — Z886 Allergy status to analgesic agent status: Secondary | ICD-10-CM

## 2017-05-27 DIAGNOSIS — I480 Paroxysmal atrial fibrillation: Secondary | ICD-10-CM

## 2017-05-27 DIAGNOSIS — R778 Other specified abnormalities of plasma proteins: Secondary | ICD-10-CM | POA: Diagnosis present

## 2017-05-27 DIAGNOSIS — R338 Other retention of urine: Secondary | ICD-10-CM

## 2017-05-27 DIAGNOSIS — Z8249 Family history of ischemic heart disease and other diseases of the circulatory system: Secondary | ICD-10-CM

## 2017-05-27 DIAGNOSIS — Z9109 Other allergy status, other than to drugs and biological substances: Secondary | ICD-10-CM

## 2017-05-27 DIAGNOSIS — I5033 Acute on chronic diastolic (congestive) heart failure: Secondary | ICD-10-CM | POA: Diagnosis present

## 2017-05-27 DIAGNOSIS — K219 Gastro-esophageal reflux disease without esophagitis: Secondary | ICD-10-CM | POA: Diagnosis present

## 2017-05-27 DIAGNOSIS — Z7901 Long term (current) use of anticoagulants: Secondary | ICD-10-CM

## 2017-05-27 LAB — COMPREHENSIVE METABOLIC PANEL
ALT: 18 U/L (ref 14–54)
AST: 23 U/L (ref 15–41)
Albumin: 3.7 g/dL (ref 3.5–5.0)
Alkaline Phosphatase: 55 U/L (ref 38–126)
Anion gap: 8 (ref 5–15)
BILIRUBIN TOTAL: 1 mg/dL (ref 0.3–1.2)
BUN: 36 mg/dL — AB (ref 6–20)
CO2: 25 mmol/L (ref 22–32)
CREATININE: 1.92 mg/dL — AB (ref 0.44–1.00)
Calcium: 9.6 mg/dL (ref 8.9–10.3)
Chloride: 110 mmol/L (ref 101–111)
GFR, EST AFRICAN AMERICAN: 25 mL/min — AB (ref >60)
GFR, EST NON AFRICAN AMERICAN: 22 mL/min — AB (ref >60)
Glucose, Bld: 120 mg/dL — ABNORMAL HIGH (ref 65–99)
POTASSIUM: 3.7 mmol/L (ref 3.5–5.1)
Sodium: 143 mmol/L (ref 135–145)
TOTAL PROTEIN: 6.8 g/dL (ref 6.5–8.1)

## 2017-05-27 LAB — CBC WITH DIFFERENTIAL/PLATELET
Basophils Absolute: 0 10*3/uL (ref 0.0–0.1)
Basophils Relative: 0 %
Eosinophils Absolute: 0 10*3/uL (ref 0.0–0.7)
Eosinophils Relative: 0 %
HEMATOCRIT: 43 % (ref 36.0–46.0)
Hemoglobin: 14.5 g/dL (ref 12.0–15.0)
LYMPHS ABS: 0.5 10*3/uL — AB (ref 0.7–4.0)
LYMPHS PCT: 7 %
MCH: 32.1 pg (ref 26.0–34.0)
MCHC: 33.7 g/dL (ref 30.0–36.0)
MCV: 95.1 fL (ref 78.0–100.0)
MONO ABS: 0.6 10*3/uL (ref 0.1–1.0)
MONOS PCT: 8 %
NEUTROS ABS: 6.1 10*3/uL (ref 1.7–7.7)
Neutrophils Relative %: 85 %
Platelets: 109 10*3/uL — ABNORMAL LOW (ref 150–400)
RBC: 4.52 MIL/uL (ref 3.87–5.11)
RDW: 14.6 % (ref 11.5–15.5)
WBC: 7.2 10*3/uL (ref 4.0–10.5)

## 2017-05-27 LAB — TROPONIN I
TROPONIN I: 0.05 ng/mL — AB (ref <0.03)
Troponin I: 0.05 ng/mL (ref <0.03)

## 2017-05-27 LAB — BRAIN NATRIURETIC PEPTIDE: B NATRIURETIC PEPTIDE 5: 459.2 pg/mL — AB (ref 0.0–100.0)

## 2017-05-27 MED ORDER — LORAZEPAM 2 MG/ML IJ SOLN
0.5000 mg | Freq: Three times a day (TID) | INTRAMUSCULAR | Status: DC | PRN
  Administered 2017-05-27 – 2017-05-29 (×2): 0.5 mg via INTRAVENOUS
  Filled 2017-05-27 (×2): qty 1

## 2017-05-27 MED ORDER — POLYETHYLENE GLYCOL 3350 17 G PO PACK
17.0000 g | PACK | Freq: Every day | ORAL | Status: DC
  Administered 2017-05-30 – 2017-06-01 (×2): 17 g via ORAL
  Filled 2017-05-27 (×4): qty 1

## 2017-05-27 MED ORDER — ACETAMINOPHEN 325 MG PO TABS
650.0000 mg | ORAL_TABLET | ORAL | Status: DC | PRN
  Filled 2017-05-27: qty 2

## 2017-05-27 MED ORDER — LORATADINE 10 MG PO TABS
10.0000 mg | ORAL_TABLET | Freq: Every day | ORAL | Status: DC
  Administered 2017-05-30 – 2017-06-01 (×3): 10 mg via ORAL
  Filled 2017-05-27 (×4): qty 1

## 2017-05-27 MED ORDER — MECLIZINE HCL 25 MG PO TABS: 12.5000 mg | ORAL_TABLET | Freq: Two times a day (BID) | ORAL | Status: DC | PRN

## 2017-05-27 MED ORDER — SODIUM CHLORIDE 0.9 % IV SOLN
INTRAVENOUS | Status: DC
  Administered 2017-05-27: 20 mL via INTRAVENOUS
  Administered 2017-05-27: via INTRAVENOUS

## 2017-05-27 MED ORDER — SODIUM CHLORIDE 0.9% FLUSH
3.0000 mL | Freq: Two times a day (BID) | INTRAVENOUS | Status: DC
  Administered 2017-05-27 – 2017-06-01 (×6): 3 mL via INTRAVENOUS

## 2017-05-27 MED ORDER — SENNA 8.6 MG PO TABS: 2.0000 | ORAL_TABLET | Freq: Every evening | ORAL | Status: DC | PRN

## 2017-05-27 MED ORDER — AMIODARONE HCL 200 MG PO TABS
200.0000 mg | ORAL_TABLET | Freq: Every day | ORAL | Status: DC
  Administered 2017-05-29 – 2017-06-01 (×4): 200 mg via ORAL
  Filled 2017-05-27 (×6): qty 1

## 2017-05-27 MED ORDER — NITROGLYCERIN 0.4 MG SL SUBL: 0.4000 mg | SUBLINGUAL_TABLET | SUBLINGUAL | Status: DC | PRN

## 2017-05-27 MED ORDER — LEVOTHYROXINE SODIUM 75 MCG PO TABS
75.0000 ug | ORAL_TABLET | Freq: Every day | ORAL | Status: DC
  Administered 2017-05-30 – 2017-06-01 (×3): 75 ug via ORAL
  Filled 2017-05-27 (×3): qty 3
  Filled 2017-05-27 (×4): qty 1
  Filled 2017-05-27: qty 3
  Filled 2017-05-27: qty 1

## 2017-05-27 MED ORDER — SODIUM CHLORIDE 0.9% FLUSH: 3.0000 mL | INTRAVENOUS | Status: DC | PRN

## 2017-05-27 MED ORDER — FERROUS SULFATE 325 (65 FE) MG PO TABS
325.0000 mg | ORAL_TABLET | Freq: Every day | ORAL | Status: DC
  Administered 2017-05-30 – 2017-06-01 (×3): 325 mg via ORAL
  Filled 2017-05-27 (×5): qty 1

## 2017-05-27 MED ORDER — FUROSEMIDE 10 MG/ML IJ SOLN
60.0000 mg | Freq: Two times a day (BID) | INTRAMUSCULAR | Status: DC
  Administered 2017-05-27 – 2017-05-28 (×2): 60 mg via INTRAVENOUS
  Filled 2017-05-27 (×2): qty 6

## 2017-05-27 MED ORDER — HYDROCODONE-ACETAMINOPHEN 5-325 MG PO TABS
1.0000 | ORAL_TABLET | Freq: Four times a day (QID) | ORAL | Status: DC | PRN
  Filled 2017-05-27: qty 1

## 2017-05-27 MED ORDER — SODIUM CHLORIDE 0.9 % IV SOLN: 250.0000 mL | INTRAVENOUS | Status: DC | PRN

## 2017-05-27 MED ORDER — APIXABAN 2.5 MG PO TABS
2.5000 mg | ORAL_TABLET | Freq: Two times a day (BID) | ORAL | Status: DC
  Administered 2017-05-28 – 2017-06-01 (×8): 2.5 mg via ORAL
  Filled 2017-05-27 (×9): qty 1

## 2017-05-27 MED ORDER — SIMVASTATIN 10 MG PO TABS
10.0000 mg | ORAL_TABLET | Freq: Every day | ORAL | Status: DC
  Administered 2017-05-28 – 2017-05-31 (×4): 10 mg via ORAL
  Filled 2017-05-27 (×4): qty 1

## 2017-05-27 MED ORDER — ONDANSETRON HCL 4 MG/2ML IJ SOLN: 4.0000 mg | Freq: Four times a day (QID) | INTRAMUSCULAR | Status: DC | PRN

## 2017-05-27 MED ORDER — PANTOPRAZOLE SODIUM 40 MG PO TBEC
40.0000 mg | DELAYED_RELEASE_TABLET | Freq: Every day | ORAL | Status: DC
  Administered 2017-05-30 – 2017-06-01 (×3): 40 mg via ORAL
  Filled 2017-05-27 (×4): qty 1

## 2017-05-27 NOTE — ED Notes (Signed)
EMS was location for 4 hours-living conditions aren't suitable-patient was refused to sign EMS tablet refusing transport-[facility] are going to do an assessment Monday

## 2017-05-27 NOTE — ED Triage Notes (Signed)
Per EMS/[facility]-states she has been to ED for the same symptoms 3-4 times in past month--states she livers alone and can no longer care for herself-patient has dementia and no longer recognizes family members-states frequent falls-daughter took papers out

## 2017-05-27 NOTE — ED Notes (Signed)
Pt sedated from Ativan unable to give medication orally at this time. Family at bedside call light in reach no reaction to medication noted.  Hospital sitter with pt.

## 2017-05-27 NOTE — ED Provider Notes (Signed)
[facility] DEPT Provider Note   CSN: [id number] Arrival date & time: 05/27/17  1609     History   Chief Complaint Chief Complaint  Patient presents with  . IVC    HPI [name] is a 83 y.o. female.  83 year old female presents under IVC due to inability to take care of herself. According to the petition, patient has not been taking her medications appropriately or cane for her basic needs. Does have home health 3 times a week. The patient herself denies any complaints at this time. She is on oxygen at baseline and denies being short of breath. Denies any chest or abdominal discomfort. No urinary symptoms. When EMS arrived initially, she refused transport and her daughter had the placement IVC to be transported here. Patient has had frequent falls.      Past Medical History:  Diagnosis Date  . Anemia   . Atrial fibrillation ([place])    a. Dx 01/2014->Eliquis started.  . Cancer ([place])   . Chest pain    a. 2011 Neg MV;  b. 01/2014 Cath: LM 20-30, LAD nl, D1 nl, LCX nl, OM1 nl, RCA dom 30-48m, PD/PL nl, EF 65%->Med Rx; c. 03/2016 MV: no ischemia/infarct. EF 63%.  . Degenerative joint disease   . Diastolic CHF, acute on chronic ([place]) 01/31/2012  . Dyslipidemia   . Dysrhythmia   . Family history of adverse reaction to anesthesia    " multiple family members have difficulty waking "  . GERD (gastroesophageal reflux disease)   . History of skin cancer    tafeen/ non melanoma.   Marland Kitchen Hx of varicella   . Hypertension   . Hypothyroidism   . Monoclonal gammopathies   . Pacemaker   . Palpitations    PVCs/bigeminy on event in May 2009 revealing this was relatively asymptomatic  . Right lower quadrant abdominal pain 07/19/2012   Recurrent  With nausea    This is new since she had ct for llq pain in [facility]    R/o appendiceal problem  Hernia  Get ct scan  And plan  Fu     . Thrombocytopenia [facility])     Patient Active Problem List   Diagnosis Date Noted  . Mitral regurgitation  03/10/2017  . Mitral stenosis 03/10/2017  . Chest pain 03/08/2017  . Dehydration 11/18/2016  . Intractable nausea and vomiting 11/18/2016  . Generalized weakness 11/18/2016  . Acute kidney injury superimposed on chronic kidney disease ([place]) 11/11/2016  . Hypokalemia 11/11/2016  . Hypertensive heart disease 04/01/2016  . PAF (paroxysmal atrial fibrillation) ([place]) 03/31/2016  . Chest pain with moderate risk for cardiac etiology, negative MI and Nuc study, + muscular skeletal   . Atypical chest pain 01/21/2016  . CKD (chronic kidney disease) stage 4, GFR 15-29 ml/min ([facility]) 05/09/2015  . Anemia of chronic disease 05/09/2015  . Dyslipidemia 05/09/2015  . Iron deficiency anemia 11/21/2014  . Pacemaker 04/27/2014  . Chronic anticoagulation 02/25/2014  . Chronic diastolic CHF (congestive heart failure) ([place]) 02/24/2014  . Atrial fibrillation ([place]) 01/29/2014  . MGUS (monoclonal gammopathy of unknown significance) 08/01/2013  . Thrombocytopenia ([place])   . Hypothyroidism 02/19/2012  . Hyperlipidemia 12/15/2008  . HYPERTENSION, BENIGN 12/15/2008    Past Surgical History:  Procedure Laterality Date  . CARDIOVERSION N/A 02/26/2014   Procedure: CARDIOVERSION AT BEDSIDE;  Surgeon: Pixie Casino, MD;  Location: [facility];  Service: Cardiovascular;  Laterality: N/A;  . CARDIOVERSION N/A 04/22/2014   Procedure: CARDIOVERSION (BEDSIDE);  Surgeon: [name],  MD;  Location: [facility] OR;  Service: Cardiovascular;  Laterality: N/A;  . CATARACT EXTRACTION     Bilateral  implantt  . CESAREAN SECTION     times 2  . CHOLECYSTECTOMY  1999  . ESOPHAGOGASTRODUODENOSCOPY N/A 07/06/2014   Procedure: ESOPHAGOGASTRODUODENOSCOPY (EGD);  Surgeon: Ladene Artist, MD;  Location: [facility] ENDOSCOPY;  Service: Endoscopy;  Laterality: N/A;  . INSERT / REPLACE / REMOVE PACEMAKER    . LEFT HEART CATHETERIZATION WITH CORONARY ANGIOGRAM N/A 01/29/2014   Procedure: LEFT HEART CATHETERIZATION WITH CORONARY ANGIOGRAM;  Surgeon:  Blane Ohara, MD;  Location: [facility] CATH LAB;  Service: Cardiovascular;  Laterality: N/A;  . PACEMAKER INSERTION  04/23/2014   STJ Assurity dual chamber pacemaker implanted by Dr Rayann Heman  . PERMANENT PACEMAKER INSERTION N/A 04/23/2014   Procedure: PERMANENT PACEMAKER INSERTION;  Surgeon: Evans Lance, MD;  Location[facility] CATH LAB;  Service: Cardiovascular;  Laterality: N/A;  . RIGHT HEART CATHETERIZATION N/A 09/22/2014   Procedure: RIGHT HEART CATH;  Surgeon: Jolaine Artist, MD;  Location: [facility] CATH LAB;  Service: Cardiovascular;  Laterality: N/A;  . SHOULDER SURGERY  1996   Right     OB History    No data available       Home Medications    Prior to Admission medications   Medication Sig Start Date End Date Taking? Authorizing Provider  amiodarone (PACERONE) 200 MG tablet Take 1 tablet (200 mg total) by mouth daily. 11/25/16   Bensimhon, Shaune Pascal, MD  denosumab (PROLIA) 60 MG/ML SOLN injection Inject 60 mg into the skin every 6 (six) months. Administer in upper arm, thigh, or abdomen    [provider]  ELIQUIS 2.5 MG TABS tablet TAKE ONE TABLET BY MOUTH TWICE DAILY 05/22/17   Bensimhon, Shaune Pascal, MD  Ferrous Sulfate (IRON) 325 (65 Fe) MG TABS Take 1 tablet by mouth daily. 07/01/16   Murlean Iba, MD  HYDROcodone-acetaminophen (NORCO/VICODIN) 5-325 MG tablet  12/27/16   [provider]  levothyroxine (SYNTHROID, LEVOTHROID) 75 MCG tablet TAKE 1 TABLET BY MOUTH ONCE DAILY 05/22/17   Panosh, Standley Brooking, MD  loratadine (CLARITIN) 10 MG tablet Take 1 tablet (10 mg total) by mouth daily. 03/10/17   Jani Gravel, MD  meclizine (ANTIVERT) 12.5 MG tablet Take 1 tablet (12.5 mg total) by mouth 2 (two) times daily as needed for dizziness. 09/14/16   Bensimhon, Shaune Pascal, MD  metolazone (ZAROXOLYN) 2.5 MG tablet Take 1 tablet (2.5 mg total) by mouth as needed (for weight 134 lb or greater). 03/09/16   Bensimhon, Shaune Pascal, MD  nitroGLYCERIN (NITROSTAT) 0.4 MG SL tablet Place 1 tablet (0.4  mg total) under the tongue every 5 (five) minutes as needed. For chest pain 05/06/16   Bensimhon, Shaune Pascal, MD  pantoprazole (PROTONIX) 40 MG tablet Take 40 mg by mouth daily.    [provider]  polyethylene glycol (MIRALAX) packet Take 17 g by mouth daily. Mix with 8 ounces of liquid. 03/11/17   Quintella Reichert, MD  potassium chloride SA (K-DUR,KLOR-CON) 20 MEQ tablet Take 2 tablets (40 mEq total) by mouth daily. Take an additional 51meq with Metolazone. 10/04/16   Bensimhon, Shaune Pascal, MD  senna (SENOKOT) 8.6 MG TABS tablet Take 2 tablets (17.2 mg total) by mouth at bedtime. Patient taking differently: Take 2 tablets by mouth at bedtime as needed for mild constipation.  11/13/16   Patrecia Pour, Christean Grief, MD  simvastatin (ZOCOR) 10 MG tablet TAKE ONE TABLET BY MOUTH AT BEDTIME 02/14/17  Larey Dresser, MD  torsemide (DEMADEX) 20 MG tablet Take 2 tablets (40 mg total) by mouth daily. Please start on 11/22/2016 11/22/16   Elgergawy, Silver Huguenin, MD    Family History Family History  Problem Relation Age of Onset  . Coronary artery disease Father   . Heart disease Father   . Heart attack Father   . Alzheimer's disease Mother   . Lung cancer Brother 22  . Lung cancer Unknown     Social History Social History  Substance Use Topics  . Smoking status: Never Smoker  . Smokeless tobacco: Never Used  . Alcohol use No     Allergies   Other; Tramadol; and Ibuprofen   Review of Systems Review of Systems  All other systems reviewed and are negative.    Physical Exam Updated Vital Signs There were no vitals taken for this visit.  Physical Exam  Constitutional: She is oriented to person, place, and time. She appears well-developed and well-nourished.  Non-toxic appearance. No distress.  HENT:  Head: Normocephalic and atraumatic.  Eyes: Pupils are equal, round, and reactive to light. Conjunctivae, EOM and lids are normal.  Neck: Normal range of motion. Neck supple. No tracheal deviation  present. No thyroid mass present.  Cardiovascular: Normal rate, regular rhythm and normal heart sounds.  Exam reveals no gallop.   No murmur heard. Pulmonary/Chest: Effort normal. No stridor. No respiratory distress. She has decreased breath sounds. She has no wheezes. She has no rhonchi. She has no rales.  Abdominal: Soft. Normal appearance and bowel sounds are normal. She exhibits no distension. There is no tenderness. There is no rebound and no CVA tenderness.  Musculoskeletal: Normal range of motion. She exhibits no edema or tenderness.  Neurological: She is alert and oriented to person, place, and time. She displays atrophy. No cranial nerve deficit or sensory deficit. GCS eye subscore is 4. GCS verbal subscore is 5. GCS motor subscore is 6.  Skin: Skin is warm and dry. No abrasion and no rash noted.  Psychiatric: Her speech is normal. Her affect is blunt. She is slowed. Thought content is not delusional. She expresses no suicidal plans and no homicidal plans. She is attentive.  Nursing note and vitals reviewed.    ED Treatments / Results  Labs (all labs ordered are listed, but only abnormal results are displayed) Labs Reviewed  URINE CULTURE  CBC WITH DIFFERENTIAL/PLATELET  COMPREHENSIVE METABOLIC PANEL  URINALYSIS, ROUTINE W REFLEX MICROSCOPIC  BRAIN NATRIURETIC PEPTIDE  TROPONIN I    EKG  EKG Interpretation None       Radiology No results found.  Procedures Procedures (including critical care time)  Medications Ordered in ED Medications  0.9 %  sodium chloride infusion (not administered)     Initial Impression / Assessment and Plan / ED Course  I have reviewed the triage vital signs and the nursing notes.  Pertinent labs & imaging results that were available during my care of the patient were reviewed by me and considered in my medical decision making (see chart for details).     Patient here with findings of CHF. She is mentating appropriately at this time.  Is alert and oriented 4. Has been seen by behavior health who will have the patient seen by the psychiatrist in the morning. Discussed with hospitalist and she'll be admitted  Final Clinical Impressions(s) / ED Diagnoses   Final diagnoses:  SOB (shortness of breath)    New Prescriptions New Prescriptions   No medications on  file     Lacretia Leigh, MD 05/27/17 [phone]

## 2017-05-27 NOTE — ED Notes (Signed)
Off floor for testing 

## 2017-05-27 NOTE — H&P (Signed)
History and Physical    [name] YQI:[id number] DOB: 08-12-1925 DOA: 05/27/2017  Referring MD/NP/PA:   PCP: Burnis Medin, MD   Patient coming from:  The patient is coming from home.  At baseline, pt is dependent for most of ADL.   Chief Complaint: SOB, AMS and fall  HPI: [name] is a 83 y.o. female with medical history significant of hyperlipidemia, GERD, hypothyroidism, thrombocytopenia, MGUS, pacemaker placement, dCHF, atrial fibrillation on Eliquis, anemia, dementia, CKD-4, who presents with SOB, AMS, frequent fall.  Pt has dementia, it is difficult to get accurate history from patient herself. Per report, pt presents under IVC by family due to inability to take care of herself. According to the petition, patient becomes confused in the past two days and has not been taking her medications appropriately or care of her basic needs. She lives alone at home. Pt has had fall twice at home, not sure if she has any injury. She seems to have little guarding to her left hip on examination.The patient herself denies any complaints at this time, but she was noted to have respiratory distress. She denies chest pain. No active cough, nausea, vomiting, diarrhea noted. Patient moves all extremities. No facial droop or slurred speech noted. No fever or chills. She has home health 3 times a week per report. Per report, when EMS arrived initially, she refused transport and her daughter had the placement IVC to be transported here.  ED Course: pt was found to have BNP 459, troponin 0.05, stable renal function, oxygen saturation 100% on room air, chest x-ray showed a mild vascular congestion. Pending UA. CT head is negative for acute intracranial abnormalities. Patient is admitted to telemetry bed as inpatient.  Review of Systems: Could not be reviewed accurately due to altered mental status.  Allergy:  Allergies  Allergen Reactions  . Other Shortness Of Breath    Detergents,  perfumes and other strong odor emitting compounds  . Tramadol Shortness Of Breath and Nausea Only  . Ibuprofen Other (See Comments)    Told not to take med    Past Medical History:  Diagnosis Date  . Anemia   . Atrial fibrillation ([place])    a. Dx 01/2014->Eliquis started.  . Cancer ([place])   . Chest pain    a. 2011 Neg MV;  b. 01/2014 Cath: LM 20-30, LAD nl, D1 nl, LCX nl, OM1 nl, RCA dom 30-15m, PD/PL nl, EF 65%->Med Rx; c. 03/2016 MV: no ischemia/infarct. EF 63%.  . Degenerative joint disease   . Diastolic CHF, acute on chronic ([place]) 01/31/2012  . Dyslipidemia   . Dysrhythmia   . Family history of adverse reaction to anesthesia    " multiple family members have difficulty waking "  . GERD (gastroesophageal reflux disease)   . History of skin cancer    tafeen/ non melanoma.   [place] Kitchen Hx of varicella   . Hypertension   . Hypothyroidism   . Monoclonal gammopathies   . Pacemaker   . Palpitations    PVCs/bigeminy on event in May 2009 revealing this was relatively asymptomatic  . Right lower quadrant abdominal pain 07/19/2012   Recurrent  With nausea    This is new since she had ct for llq pain in [facility]    R/o appendiceal problem  Hernia  Get ct scan  And plan  Fu     . Thrombocytopenia ([place])     Past Surgical History:  Procedure Laterality Date  . CARDIOVERSION N/A 02/26/2014  Procedure: CARDIOVERSION AT BEDSIDE;  Surgeon: Pixie Casino, MD;  Location: [facility];  Service: Cardiovascular;  Laterality: N/A;  . CARDIOVERSION N/A 04/22/2014   Procedure: CARDIOVERSION (BEDSIDE);  Surgeon: [name], MD;  Location: [facility] OR;  Service: Cardiovascular;  Laterality: N/A;  . CATARACT EXTRACTION     Bilateral  implantt  . CESAREAN SECTION     times 2  . CHOLECYSTECTOMY  1999  . ESOPHAGOGASTRODUODENOSCOPY N/A 07/06/2014   Procedure: ESOPHAGOGASTRODUODENOSCOPY (EGD);  Surgeon: Ladene Artist, MD;  Location: [facility] ENDOSCOPY;  Service: Endoscopy;  Laterality: N/A;  . INSERT / REPLACE / REMOVE PACEMAKER     . LEFT HEART CATHETERIZATION WITH CORONARY ANGIOGRAM N/A 01/29/2014   Procedure: LEFT HEART CATHETERIZATION WITH CORONARY ANGIOGRAM;  Surgeon: Blane Ohara, MD;  Location: [facility] CATH LAB;  Service: Cardiovascular;  Laterality: N/A;  . PACEMAKER INSERTION  04/23/2014   STJ Assurity dual chamber pacemaker implanted by Dr Rayann Heman  . PERMANENT PACEMAKER INSERTION N/A 04/23/2014   Procedure: PERMANENT PACEMAKER INSERTION;  Surgeon: Evans Lance, MD;  Location: [facility] CATH LAB;  Service: Cardiovascular;  Laterality: N/A;  . RIGHT HEART CATHETERIZATION N/A 09/22/2014   Procedure: RIGHT HEART CATH;  Surgeon: Jolaine Artist, MD;  Location: [facility] CATH LAB;  Service: Cardiovascular;  Laterality: N/A;  . SHOULDER SURGERY  1996   Right     Social History:  reports that she has never smoked. She has never used smokeless tobacco. She reports that she does not drink alcohol or use drugs.  Family History:  Family History  Problem Relation Age of Onset  . Coronary artery disease Father   . Heart disease Father   . Heart attack Father   . Alzheimer's disease Mother   . Lung cancer Brother 37  . Lung cancer Unknown      Prior to Admission medications   Medication Sig Start Date End Date Taking? Authorizing Provider  amiodarone (PACERONE) 200 MG tablet Take 1 tablet (200 mg total) by mouth daily. 11/25/16   Bensimhon, Shaune Pascal, MD  denosumab (PROLIA) 60 MG/ML SOLN injection Inject 60 mg into the skin every 6 (six) months. Administer in upper arm, thigh, or abdomen    [provider]  ELIQUIS 2.5 MG TABS tablet TAKE ONE TABLET BY MOUTH TWICE DAILY 05/22/17   Bensimhon, Shaune Pascal, MD  Ferrous Sulfate (IRON) 325 (65 Fe) MG TABS Take 1 tablet by mouth daily. 07/01/16   Murlean Iba, MD  HYDROcodone-acetaminophen (NORCO/VICODIN) 5-325 MG tablet  12/27/16   [provider]  levothyroxine (SYNTHROID, LEVOTHROID) 75 MCG tablet TAKE 1 TABLET BY MOUTH ONCE DAILY 05/22/17   Panosh, Standley Brooking, MD    loratadine (CLARITIN) 10 MG tablet Take 1 tablet (10 mg total) by mouth daily. 03/10/17   Jani Gravel, MD  meclizine (ANTIVERT) 12.5 MG tablet Take 1 tablet (12.5 mg total) by mouth 2 (two) times daily as needed for dizziness. 09/14/16   Bensimhon, Shaune Pascal, MD  metolazone (ZAROXOLYN) 2.5 MG tablet Take 1 tablet (2.5 mg total) by mouth as needed (for weight 134 lb or greater). 03/09/16   Bensimhon, Shaune Pascal, MD  nitroGLYCERIN (NITROSTAT) 0.4 MG SL tablet Place 1 tablet (0.4 mg total) under the tongue every 5 (five) minutes as needed. For chest pain 05/06/16   Bensimhon, Shaune Pascal, MD  pantoprazole (PROTONIX) 40 MG tablet Take 40 mg by mouth daily.    [provider]  polyethylene glycol (MIRALAX) packet Take 17 g by mouth daily. Mix  with 8 ounces of liquid. 03/11/17   Quintella Reichert, MD  potassium chloride SA (K-DUR,KLOR-CON) 20 MEQ tablet Take 2 tablets (40 mEq total) by mouth daily. Take an additional 91meq with Metolazone. 10/04/16   Bensimhon, Shaune Pascal, MD  senna (SENOKOT) 8.6 MG TABS tablet Take 2 tablets (17.2 mg total) by mouth at bedtime. Patient taking differently: Take 2 tablets by mouth at bedtime as needed for mild constipation.  11/13/16   Patrecia Pour, Christean Grief, MD  simvastatin (ZOCOR) 10 MG tablet TAKE ONE TABLET BY MOUTH AT BEDTIME 02/14/17   Larey Dresser, MD  torsemide (DEMADEX) 20 MG tablet Take 2 tablets (40 mg total) by mouth daily. Please start on 11/22/2016 11/22/16   Elgergawy, Silver Huguenin, MD    Physical Exam: Vitals:   05/27/17 1900 05/27/17 1913 05/27/17 2000 05/27/17 2043  BP: (!) 142/70  139/76 130/80  Pulse:   89 89  Resp: 17   17  Temp:  (!) 97 F (36.1 C)    TempSrc:  Rectal    SpO2:   93% 95%   General: Not in acute distress HEENT:       Eyes: PERRL, EOMI, no scleral icterus.       ENT: No discharge from the ears and nose, no pharynx injection, no tonsillar enlargement.        Neck: No JVD, no bruit, no mass felt. Heme: No neck lymph node  enlargement. Cardiac: S1/S2, RRR, No murmurs, No gallops or rubs. Respiratory:  No rales, wheezing, rhonchi or rubs. GI: Soft, nondistended, nontender, no rebound pain, no organomegaly, BS present. GU: No hematuria Ext: No pitting leg edema bilaterally. 2+DP/PT pulse bilaterally. Musculoskeletal: No joint deformities, No joint redness or warmth, no limitation of ROM in spin. She seems to have little guarding to her left hip on examination. Skin: No rashes. Has bruises in arms. Neuro: confused and not oriented X3, cranial nerves II-XII grossly intact, moves all extremities normally. Psych: Patient is not psychotic, no suicidal or hemocidal ideation.  Labs on Admission: I have personally reviewed following labs and imaging studies  CBC:  Recent Labs Lab 05/27/17 1745  WBC 7.2  NEUTROABS 6.1  HGB 14.5  HCT 43.0  MCV 95.1  PLT 160*   Basic Metabolic Panel:  Recent Labs Lab 05/27/17 1745  NA 143  K 3.7  CL 110  CO2 25  GLUCOSE 120*  BUN 36*  CREATININE 1.92*  CALCIUM 9.6   GFR: Estimated Creatinine Clearance: 14.5 mL/min (A) (by C-G formula based on SCr of 1.92 mg/dL (H)). Liver Function Tests:  Recent Labs Lab 05/27/17 1745  AST 23  ALT 18  ALKPHOS 55  BILITOT 1.0  PROT 6.8  ALBUMIN 3.7   No results for input(s): LIPASE, AMYLASE in the last 168 hours. No results for input(s): AMMONIA in the last 168 hours. Coagulation Profile: No results for input(s): INR, PROTIME in the last 168 hours. Cardiac Enzymes:  Recent Labs Lab 05/27/17 1745  TROPONINI 0.05*   BNP (last 3 results) No results for input(s): PROBNP in the last 8760 hours. HbA1C: No results for input(s): HGBA1C in the last 72 hours. CBG: No results for input(s): GLUCAP in the last 168 hours. Lipid Profile: No results for input(s): CHOL, HDL, LDLCALC, TRIG, CHOLHDL, LDLDIRECT in the last 72 hours. Thyroid Function Tests: No results for input(s): TSH, T4TOTAL, FREET4, T3FREE, THYROIDAB in the  last 72 hours. Anemia Panel: No results for input(s): VITAMINB12, FOLATE, FERRITIN, TIBC, IRON, RETICCTPCT in the last  72 hours. Urine analysis:    Component Value Date/Time   COLORURINE YELLOW 03/08/2017 2320   APPEARANCEUR CLEAR 03/08/2017 2320   LABSPEC 1.014 03/08/2017 2320   PHURINE 6.0 03/08/2017 2320   GLUCOSEU NEGATIVE 03/08/2017 2320   GLUCOSEU NEGATIVE 12/19/2011 0956   HGBUR NEGATIVE 03/08/2017 2320   BILIRUBINUR NEGATIVE 03/08/2017 2320   BILIRUBINUR neg 02/27/2017 1618   KETONESUR NEGATIVE 03/08/2017 2320   PROTEINUR NEGATIVE 03/08/2017 2320   UROBILINOGEN 0.2 02/27/2017 1618   UROBILINOGEN 0.2 09/10/2015 1150   NITRITE NEGATIVE 03/08/2017 2320   LEUKOCYTESUR TRACE (A) 03/08/2017 2320   Sepsis Labs: @LABRCNTIP (procalcitonin:4,lacticidven:4) )No results found for this or any previous visit (from the past 240 hour(s)).   Radiological Exams on Admission: Dg Chest 2 View  Result Date: 05/27/2017 CLINICAL DATA:  Thinks she fell today but is unsure, history dementia, atrial fibrillation, hypertension, pacemaker EXAM: CHEST  2 VIEW COMPARISON:  03/08/2017 FINDINGS: RIGHT subclavian transvenous pacemaker with leads projecting over a performed and RIGHT ventricle unchanged. Enlargement of cardiac silhouette with suspect mild pulmonary vascular congestion. Atherosclerotic calcification aorta. Rotated to the LEFT. Mediastinal contours otherwise normal. BILATERAL infiltrates greater on LEFT, question asymmetric edema versus infection. No definite pleural effusion or pneumothorax. Diffuse osseous demineralization. IMPRESSION: Enlargement of cardiac silhouette post pacemaker with suspect mild pulmonary vascular congestion. Asymmetric infiltrates greater on LEFT, question pneumonia versus asymmetric edema. Electronically Signed   By: Lavonia Dana M.D.   On: 05/27/2017 17:10   Ct Head Wo Contrast  Result Date: 05/27/2017 CLINICAL DATA:  Altered mental status EXAM: CT HEAD WITHOUT  CONTRAST TECHNIQUE: Contiguous axial images were obtained from the base of the skull through the vertex without intravenous contrast. COMPARISON:  01/27/2017 FINDINGS: Brain: No acute intracranial abnormality. Specifically, no hemorrhage, hydrocephalus, mass lesion, acute infarction, or significant intracranial injury. Vascular: No hyperdense vessel or unexpected calcification. Skull: No acute calvarial abnormality. Sinuses/Orbits: Visualized paranasal sinuses and mastoids clear. Orbital soft tissues unremarkable. Other: None IMPRESSION: No acute intracranial abnormality. Electronically Signed   By: Rolm Baptise M.D.   On: 05/27/2017 17:44     EKG: Independently reviewed.  Paced rhythm, QTC 500   Assessment/Plan Principal Problem:   Acute on chronic diastolic (congestive) heart failure (HCC) Active Problems:   Hypothyroidism   Thrombocytopenia (HCC)   MGUS (monoclonal gammopathy of unknown significance)   Atrial fibrillation (HCC)   Pacemaker   Elevated troponin   CKD (chronic kidney disease) stage 4, GFR 15-29 ml/min (HCC)   Dyslipidemia   Fall   Acute metabolic encephalopathy   Mild acute on chronic diastolic (congestive) heart failure [facility]): Patient has SOB, elevated BNP 459, and mild vascular congestion on chest x-ray, suggesting mild CHF exacerbation. -will admit to tele bed as inpt. -Lasix 60 mg bid by IV -trop x 3 -2d echo -Daily weights -strict I/O's -Low salt diet  Elevated troponin: Troponin 0.05. Patient denies chest pain. Most likely due to demand ischemia 2/2 to CHF exacerbation -trop x 3 -prn NTG -continue zocor -f/y 2d Echo  Atrial Fibrillation: CHA2DS2-VASc Score is 4, needs oral anticoagulation, patient is on Eliquis at home.  Heart rate is well controlled. -Continue Eliquis and amiodarone  CKD-IV: stable. Baseline creatinine 1.7-2.0. Her creatinine is 1.92. -Follow-up renal function by bmap  Hypothyroidism: Last TSH was 3.28 on 02/27/17 -Continue home  Synthroid  Thrombocytopenia and MGUS: platelet 109 and hgb 14.5.  -f/u by CBC  Dyslipidemia:  -continue zocor  Fall: Etiology is not clear clear. CT scans negative for acute intracranial  abnormalities. No focal neurological findings on physical examination, less likely to have stroke. In addition, patient has pacemaker, cannot to MRI of the brain. -PT/OT  Acute metabolic encephalopathy: Etiology is not clear. Likely due to multifactorial etiology, including CHF exacerbation, possible dementia. Per her daughter, patient was recently seen by her neurologist, who thought that patient may have early stage of dementia. -Frequent neuro check -Treat underlying issues as above -f/u UA to r/o UTI -consult CM and SW for possibel rehab placement   DVT ppx: on Eliquis Code Status: DNR (I discussed with patient's daughter who is the POA, explained the meaning of [place]. Per daughter, patient would want to be DNR) Family Communication: None at bed side.   Disposition Plan:  Anticipate discharge previous rehab facility Consults called:  none Admission status: Inpatient/tele     Date of Service 05/27/2017    Ivor Costa [facility] Pager [phone]  If 7PM-7AM, please contact night-coverage [email or web address] Password [facility] 05/27/2017, 9:35 PM

## 2017-05-27 NOTE — ED Notes (Signed)
Bed: WA17 Expected date:  Expected time:  Means of arrival:  Comments: IVC

## 2017-05-27 NOTE — ED Notes (Addendum)
No respiratory or acute distress noted pt confused trying to get out of bed yelling I'm dying don't you understand call light in reach.

## 2017-05-27 NOTE — ED Notes (Signed)
Gave pt ativan to help pt relax to get xray of hip pulse ox on 2L 100%

## 2017-05-27 NOTE — ED Notes (Addendum)
Pt. Blood draw was unsuccessful x 2. Nurse aware. Main lab (Phlebotomy) was called to draw pt. Labs. Nurse aware. Spoke with [employer]. He will relate the message to night shift phlebotomist. (Shift change)

## 2017-05-27 NOTE — ED Notes (Signed)
Pt is confused and unwilling to take medication or drink at this time pt just states I'm dying don't let me die.

## 2017-05-28 ENCOUNTER — Inpatient Hospital Stay (HOSPITAL_COMMUNITY): Payer: Medicare Other

## 2017-05-28 DIAGNOSIS — R0602 Shortness of breath: Secondary | ICD-10-CM

## 2017-05-28 LAB — LIPID PANEL
Cholesterol: 178 mg/dL (ref 0–200)
HDL: 97 mg/dL (ref >40)
LDL Cholesterol: 73 mg/dL (ref 0–99)
Total CHOL/HDL Ratio: 1.8 RATIO
Triglycerides: 42 mg/dL (ref <150)
VLDL: 8 mg/dL (ref 0–40)

## 2017-05-28 LAB — ECHOCARDIOGRAM COMPLETE
AVPHT: 418 ms
CHL CUP DOP CALC LVOT VTI: 15.7 cm
CHL CUP MV DEC (S): 141
CHL CUP STROKE VOLUME: 32 mL
EERAT: 20.48
EWDT: 141 ms
FS: 28 % (ref 28–44)
IV/PV OW: 0.95
LA diam index: 3.36 cm/m2
LA vol: 100 mL
LASIZE: 52 mm
LAVOLA4C: 88.5 mL
LAVOLIN: 64.7 mL/m2
LEFT ATRIUM END SYS DIAM: 52 mm
LV PW d: 14 mm — AB (ref 0.6–1.1)
LV SIMPSON'S DISK: 56
LV TDI E'LATERAL: 6.64
LV TDI E'MEDIAL: 5.55
LV dias vol index: 36 mL/m2
LV dias vol: 56 mL (ref 46–106)
LV e' LATERAL: 6.64 cm/s
LV sys vol index: 16 mL/m2
LV sys vol: 25 mL (ref 14–42)
LVEEAVG: 20.48
LVEEMED: 20.48
LVOT SV: 32 mL
LVOT area: 2.01 cm2
LVOT diameter: 16 mm
LVOT peak vel: 80.5 cm/s
Lateral S' vel: 9.9 cm/s
MRPISAEROA: 0.18 cm2
MVPG: 7 mmHg
MVPKAVEL: 43.7 m/s
MVPKEVEL: 136 m/s
Reg peak vel: 261 cm/s
TAPSE: 18.2 mm
TRMAXVEL: 261 cm/s
VTI: 231 cm
WEIGHTICAEL: 2014.12 [oz_av]

## 2017-05-28 LAB — CBC
HCT: 43.6 % (ref 36.0–46.0)
HEMOGLOBIN: 14.2 g/dL (ref 12.0–15.0)
MCH: 31.6 pg (ref 26.0–34.0)
MCHC: 32.6 g/dL (ref 30.0–36.0)
MCV: 96.9 fL (ref 78.0–100.0)
PLATELETS: 108 10*3/uL — AB (ref 150–400)
RBC: 4.5 MIL/uL (ref 3.87–5.11)
RDW: 14.8 % (ref 11.5–15.5)
WBC: 7.6 10*3/uL (ref 4.0–10.5)

## 2017-05-28 LAB — BASIC METABOLIC PANEL
ANION GAP: 9 (ref 5–15)
BUN: 35 mg/dL — ABNORMAL HIGH (ref 6–20)
CO2: 26 mmol/L (ref 22–32)
Calcium: 9.6 mg/dL (ref 8.9–10.3)
Chloride: 112 mmol/L — ABNORMAL HIGH (ref 101–111)
Creatinine, Ser: 1.76 mg/dL — ABNORMAL HIGH (ref 0.44–1.00)
GFR calc Af Amer: 28 mL/min — ABNORMAL LOW (ref >60)
GFR, EST NON AFRICAN AMERICAN: 24 mL/min — AB (ref >60)
GLUCOSE: 123 mg/dL — AB (ref 65–99)
POTASSIUM: 3.8 mmol/L (ref 3.5–5.1)
Sodium: 147 mmol/L — ABNORMAL HIGH (ref 135–145)

## 2017-05-28 LAB — URINALYSIS, ROUTINE W REFLEX MICROSCOPIC
Bilirubin Urine: NEGATIVE
Glucose, UA: NEGATIVE mg/dL
HGB URINE DIPSTICK: NEGATIVE
Ketones, ur: NEGATIVE mg/dL
Leukocytes, UA: NEGATIVE
NITRITE: NEGATIVE
PROTEIN: NEGATIVE mg/dL
Specific Gravity, Urine: 1.006 (ref 1.005–1.030)
pH: 7 (ref 5.0–8.0)

## 2017-05-28 LAB — TROPONIN I: TROPONIN I: 0.05 ng/mL — AB (ref <0.03)

## 2017-05-28 MED ORDER — ORAL CARE MOUTH RINSE
15.0000 mL | Freq: Two times a day (BID) | OROMUCOSAL | Status: DC
  Administered 2017-05-28 – 2017-06-01 (×3): 15 mL via OROMUCOSAL

## 2017-05-28 MED ORDER — POTASSIUM CHLORIDE CRYS ER 20 MEQ PO TBCR
40.0000 meq | EXTENDED_RELEASE_TABLET | Freq: Two times a day (BID) | ORAL | Status: DC
  Administered 2017-05-28 – 2017-06-01 (×6): 40 meq via ORAL
  Filled 2017-05-28 (×7): qty 2

## 2017-05-28 MED ORDER — FUROSEMIDE 10 MG/ML IJ SOLN
40.0000 mg | Freq: Two times a day (BID) | INTRAMUSCULAR | Status: DC
  Administered 2017-05-28 – 2017-05-29 (×2): 40 mg via INTRAVENOUS
  Filled 2017-05-28 (×2): qty 4

## 2017-05-28 MED ORDER — MORPHINE SULFATE (PF) 4 MG/ML IV SOLN
2.0000 mg | Freq: Once | INTRAVENOUS | Status: AC
  Administered 2017-05-28: 2 mg via INTRAVENOUS
  Filled 2017-05-28: qty 1

## 2017-05-28 NOTE — Progress Notes (Signed)
Expand All Collapse All   [] Hide copied text  PROGRESS NOTE    [name]             MBW:[id number] DOB: [birth year] DOA: 05/27/2017 PCP: Burnis Medin, MD  Brief Narrative:Linda A McMullenis a 83 y.o.femalewith medical history significant of hyperlipidemia, GERD, hypothyroidism, thrombocytopenia, MGUS, pacemaker placement, dCHF, atrial fibrillation on Eliquis,anemia, dementia, CKD-4, who presents with SOB, AMS, frequent fall.   Assessment & Plan:  Mild acute on chronic diastolic (congestive) heart failure (HCC) -admitted with mild dyspnea, hypoxia, BNP 459, and mild vascular congestion on chest x-ray, suggesting mild CHF exacerbation. -continue IV lasix today, cut down dose -monitor I/O, weights -FU Repeat ECHO  Elevated troponin -mild due to above/demand, flat trend no ACS, no chest pain -FU ECHo, stop further labs  Atrial Fibrillation:  -CHA2DS2-VASc Scoreis 4,  -rate controlled, continue ELiquis -continue amiodarone  CKD-3-4 -stable at baseline, monitor with diuresis  Hypothyroidism: Last TSH was 3.28 on 02/27/17 -Continue home Synthroid  Thrombocytopenia and MGUS:platelet 109 and hgb 14.5.  -stable, monitor  Dyslipidemia:  -continue zocor  Mild Dementia -per Chart review -Pt/Ot/CSW consults -may need ALF  DVT ppx: on Eliquis Code Status:DNR Family Communication: None at bed side.  Disposition Plan: will likely need Rehab  Subjective:feels ok, no significant dyspnea   Objective:       Vitals:   05/27/17 2043 05/27/17 2100 05/27/17 2241 05/28/17 0521  BP: 130/80 138/80 (!) 155/91 (!) 163/90  Pulse: 89 (!) 44 71 89  Resp: 17   15  Temp:   97.7 F (36.5 C) (!) 97.5 F (36.4 C)  TempSrc:   Oral Axillary  SpO2: 95% 95% 100% 99%  Weight:   59.7 kg (131 lb 9.8 oz) 57.1 kg (125 lb 14.1 oz)    Intake/Output Summary (Last 24 hours) at 05/28/17 1110 Last data filed at 05/28/17 0600  Gross per 24 hour  Intake               196 ml  Output              775 ml  Net             -579 ml       Filed Weights   05/27/17 2241 05/28/17 0521  Weight: 59.7 kg (131 lb 9.8 oz) 57.1 kg (125 lb 14.1 oz)    Examination:  General exam: Appears calm and comfortable, alert, awake, oriented to self, place and time Respiratory system: fine basilar crackles Cardiovascular system: S1 & S2 heard, RRR. No JVD, murmurs Gastrointestinal system: Abdomen is nondistended, soft and nontender.Normal bowel sounds heard. Central nervous system: Alert and oriented. No focal neurological deficits. Extremities: Symmetric 5 x 5 power. Skin: No rashes, lesions or ulcers Psychiatry: Judgement and insight appear normal. Mood & affect appropriate.     Data Reviewed:   CBC:  Last Labs    Recent Labs Lab 05/27/17 1745 05/28/17 0048  WBC 7.2 7.6  NEUTROABS 6.1  --   HGB 14.5 14.2  HCT 43.0 43.6  MCV 95.1 96.9  PLT 109* 108*     Basic Metabolic Panel:  Last Labs    Recent Labs Lab 05/27/17 1745 05/28/17 0048  NA 143 147*  K 3.7 3.8  CL 110 112*  CO2 25 26  GLUCOSE 120* 123*  BUN 36* 35*  CREATININE 1.92* 1.76*  CALCIUM 9.6 9.6     GFR: Estimated Creatinine Clearance: 15.9 mL/min (A) (by C-G formula based on  SCr of 1.76 mg/dL (H)). Liver Function Tests:  Last Labs    Recent Labs Lab 05/27/17 1745  AST 23  ALT 18  ALKPHOS 55  BILITOT 1.0  PROT 6.8  ALBUMIN 3.7     Last Labs   No results for input(s): LIPASE, AMYLASE in the last 168 hours.   Last Labs   No results for input(s): AMMONIA in the last 168 hours.   Coagulation Profile: Last Labs   No results for input(s): INR, PROTIME in the last 168 hours.   Cardiac Enzymes:  Last Labs    Recent Labs Lab 05/27/17 1745 05/27/17 2309 05/28/17 0048  TROPONINI 0.05* 0.05* 0.05*     BNP (last 3 results) Recent Labs (within last 365 days)  No results for input(s): PROBNP in the last 8760 hours.   HbA1C: Recent  Labs (last 2 labs)   No results for input(s): HGBA1C in the last 72 hours.   CBG: Last Labs   No results for input(s): GLUCAP in the last 168 hours.   Lipid Profile:  Recent Labs (last 2 labs)    Recent Labs  05/28/17 0048  CHOL 178  HDL 97  LDLCALC 73  TRIG 42  CHOLHDL 1.8     Thyroid Function Tests: Recent Labs (last 2 labs)   No results for input(s): TSH, T4TOTAL, FREET4, T3FREE, THYROIDAB in the last 72 hours.   Anemia Panel: Recent Labs (last 2 labs)   No results for input(s): VITAMINB12, FOLATE, FERRITIN, TIBC, IRON, RETICCTPCT in the last 72 hours.   Urine analysis: Labs (Brief)          Component Value Date/Time   COLORURINE STRAW (A) 05/28/2017 0445   APPEARANCEUR CLEAR 05/28/2017 0445   LABSPEC 1.006 05/28/2017 0445   PHURINE 7.0 05/28/2017 0445   GLUCOSEU NEGATIVE 05/28/2017 0445   GLUCOSEU NEGATIVE 12/19/2011 0956   HGBUR NEGATIVE 05/28/2017 0445   BILIRUBINUR NEGATIVE 05/28/2017 0445   BILIRUBINUR neg 02/27/2017 1618   KETONESUR NEGATIVE 05/28/2017 0445   PROTEINUR NEGATIVE 05/28/2017 0445   UROBILINOGEN 0.2 02/27/2017 1618   UROBILINOGEN 0.2 09/10/2015 1150   NITRITE NEGATIVE 05/28/2017 0445   LEUKOCYTESUR NEGATIVE 05/28/2017 0445     Sepsis Labs: @LABRCNTIP (procalcitonin:4,lacticidven:4)  )No results found for this or any previous visit (from the past 240 hour(s)).       Radiology Studies:  Imaging Results (Last 48 hours)  Dg Chest 2 View  Result Date: 05/27/2017 CLINICAL DATA:  Thinks she fell today but is unsure, history dementia, atrial fibrillation, hypertension, pacemaker EXAM: CHEST  2 VIEW COMPARISON:  03/08/2017 FINDINGS: RIGHT subclavian transvenous pacemaker with leads projecting over a performed and RIGHT ventricle unchanged. Enlargement of cardiac silhouette with suspect mild pulmonary vascular congestion. Atherosclerotic calcification aorta. Rotated to the LEFT. Mediastinal contours otherwise  normal. BILATERAL infiltrates greater on LEFT, question asymmetric edema versus infection. No definite pleural effusion or pneumothorax. Diffuse osseous demineralization. IMPRESSION: Enlargement of cardiac silhouette post pacemaker with suspect mild pulmonary vascular congestion. Asymmetric infiltrates greater on LEFT, question pneumonia versus asymmetric edema. Electronically Signed   By: Lavonia Dana M.D.   On: 05/27/2017 17:10   Ct Head Wo Contrast  Result Date: 05/27/2017 CLINICAL DATA:  Altered mental status EXAM: CT HEAD WITHOUT CONTRAST TECHNIQUE: Contiguous axial images were obtained from the base of the skull through the vertex without intravenous contrast. COMPARISON:  01/27/2017 FINDINGS: Brain: No acute intracranial abnormality. Specifically, no hemorrhage, hydrocephalus, mass lesion, acute infarction, or significant intracranial injury. Vascular: No hyperdense vessel  or unexpected calcification. Skull: No acute calvarial abnormality. Sinuses/Orbits: Visualized paranasal sinuses and mastoids clear. Orbital soft tissues unremarkable. Other: None IMPRESSION: No acute intracranial abnormality. Electronically Signed   By: Rolm Baptise M.D.   On: 05/27/2017 17:44   Dg Hip Unilat With Pelvis 1v Left  Result Date: 05/27/2017 CLINICAL DATA:  Fall EXAM: DG HIP (WITH OR WITHOUT PELVIS) 1V*L* COMPARISON:  None. FINDINGS: SI joints are symmetric. Pubic symphysis is intact. Both femoral heads project in joint. Moderate arthritis of both hips. No definite acute displaced fracture is seen. IMPRESSION: Moderate arthritis.  No definite acute osseous abnormality. Electronically Signed   By: Donavan Foil M.D.   On: 05/27/2017 21:54         Scheduled Meds: . amiodarone  200 mg Oral Daily  . apixaban  2.5 mg Oral BID  . ferrous sulfate  325 mg Oral Daily  . furosemide  60 mg Intravenous BID  . levothyroxine  75 mcg Oral QAC breakfast  . loratadine  10 mg Oral Daily  . mouth rinse  15 mL Mouth  Rinse BID  . pantoprazole  40 mg Oral Daily  . polyethylene glycol  17 g Oral Daily  . simvastatin  10 mg Oral QHS  . sodium chloride flush  3 mL Intravenous Q12H   Continuous Infusions: . sodium chloride       LOS: 1 day    Time spent: 62min    Domenic Polite, MD [facility] Pager [phone]  If 7PM-7AM, please contact night-coverage [email or web address] Password [email or web address] 05/28/2017, 11:10 AM

## 2017-05-28 NOTE — Discharge Summary (Deleted)
PROGRESS NOTE    [name]  RUE:[id number] DOB: 02-01-1925 DOA: 05/27/2017 PCP: Burnis Medin, MD  Brief Narrative:[name] is a 83 y.o. female with medical history significant of hyperlipidemia, GERD, hypothyroidism, thrombocytopenia, MGUS, pacemaker placement, dCHF, atrial fibrillation on Eliquis, anemia, dementia, CKD-4, who presents with SOB, AMS, frequent fall.   Assessment & Plan:  Mild acute on chronic diastolic (congestive) heart failure (HCC) -admitted with mild dyspnea, hypoxia, BNP 459, and mild vascular congestion on chest x-ray, suggesting mild CHF exacerbation. 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  -stable, monitor  Dyslipidemia:  -continue zocor  Mild Dementia -per Chart review -Pt/Ot/CSW consults -may need ALF  DVT ppx: on Eliquis Code Status:DNR Family Communication: None at bed side.   Disposition Plan:  will likely need Rehab  Subjective:feels ok, no significant dyspnea   Objective: Vitals:   05/27/17 2043 05/27/17 2100 05/27/17 2241 05/28/17 0521  BP: 130/80 138/80 (!) 155/91 (!) 163/90  Pulse: 89 (!) 44 71 89  Resp: 17   15  Temp:   97.7 F (36.5 C) (!) 97.5 F (36.4 C)  TempSrc:   Oral Axillary  SpO2: 95% 95% 100% 99%  Weight:   59.7 kg (131 lb 9.8 oz) 57.1 kg (125 lb 14.1 oz)    Intake/Output Summary (Last 24 hours) at 05/28/17 1110 Last data filed at 05/28/17 0600  Gross per 24 hour  Intake              196 ml  Output              775 ml  Net             -579 ml   Filed  Weights   05/27/17 2241 05/28/17 0521  Weight: 59.7 kg (131 lb 9.8 oz) 57.1 kg (125 lb 14.1 oz)    Examination:  General exam: Appears calm and comfortable, alert, awake, oriented to self, place and time Respiratory system: fine basilar crackles Cardiovascular system: S1 & S2 heard, RRR. No JVD, murmurs Gastrointestinal system: Abdomen is nondistended, soft and nontender.Normal bowel sounds heard. Central nervous system: Alert and oriented. No focal neurological deficits. Extremities: Symmetric 5 x 5 power. Skin: No rashes, lesions or ulcers Psychiatry: Judgement and insight appear normal. Mood & affect appropriate.     Data Reviewed:   CBC:  Recent Labs Lab 05/27/17 1745 05/28/17 0048  WBC 7.2 7.6  NEUTROABS 6.1  --   HGB 14.5 14.2  HCT 43.0 43.6  MCV 95.1 96.9  PLT 109* 147*   Basic Metabolic Panel:  Recent Labs Lab 05/27/17 1745 05/28/17 0048  NA 143 147*  K 3.7 3.8  CL 110 112*  CO2 25 26  GLUCOSE 120* 123*  BUN 36* 35*  CREATININE 1.92* 1.76*  CALCIUM 9.6 9.6   GFR: Estimated Creatinine Clearance: 15.9 mL/min (A) (by C-G formula based on SCr of 1.76 mg/dL (H)). Liver Function Tests:  Recent Labs Lab 05/27/17 1745  AST 23  ALT 18  ALKPHOS 55  BILITOT 1.0  PROT 6.8  ALBUMIN 3.7   No results for  input(s): LIPASE, AMYLASE in the last 168 hours. No results for input(s): AMMONIA in the last 168 hours. Coagulation Profile: No results for input(s): INR, PROTIME in the last 168 hours. Cardiac Enzymes:  Recent Labs Lab 05/27/17 1745 05/27/17 2309 05/28/17 0048  TROPONINI 0.05* 0.05* 0.05*   BNP (last 3 results) No results for input(s): PROBNP in the last 8760 hours. HbA1C: No results for input(s): HGBA1C in the last 72 hours. CBG: No results for input(s): GLUCAP in the last 168 hours. Lipid Profile:  Recent Labs  05/28/17 0048  CHOL 178  HDL 97  LDLCALC 73  TRIG 42  CHOLHDL 1.8   Thyroid Function Tests: No results for input(s):  TSH, T4TOTAL, FREET4, T3FREE, THYROIDAB in the last 72 hours. Anemia Panel: No results for input(s): VITAMINB12, FOLATE, FERRITIN, TIBC, IRON, RETICCTPCT in the last 72 hours. Urine analysis:    Component Value Date/Time   COLORURINE STRAW (A) 05/28/2017 0445   APPEARANCEUR CLEAR 05/28/2017 0445   LABSPEC 1.006 05/28/2017 0445   PHURINE 7.0 05/28/2017 0445   GLUCOSEU NEGATIVE 05/28/2017 0445   GLUCOSEU NEGATIVE 12/19/2011 0956   HGBUR NEGATIVE 05/28/2017 0445   BILIRUBINUR NEGATIVE 05/28/2017 0445   BILIRUBINUR neg 02/27/2017 1618   KETONESUR NEGATIVE 05/28/2017 0445   PROTEINUR NEGATIVE 05/28/2017 0445   UROBILINOGEN 0.2 02/27/2017 1618   UROBILINOGEN 0.2 09/10/2015 1150   NITRITE NEGATIVE 05/28/2017 0445   LEUKOCYTESUR NEGATIVE 05/28/2017 0445   Sepsis Labs: @LABRCNTIP (procalcitonin:4,lacticidven:4)  )No results found for this or any previous visit (from the past 240 hour(s)).       Radiology Studies: Dg Chest 2 View  Result Date: 05/27/2017 CLINICAL DATA:  Thinks she fell today but is unsure, history dementia, atrial fibrillation, hypertension, pacemaker EXAM: CHEST  2 VIEW COMPARISON:  03/08/2017 FINDINGS: RIGHT subclavian transvenous pacemaker with leads projecting over a performed and RIGHT ventricle unchanged. Enlargement of cardiac silhouette with suspect mild pulmonary vascular congestion. Atherosclerotic calcification aorta. Rotated to the LEFT. Mediastinal contours otherwise normal. BILATERAL infiltrates greater on LEFT, question asymmetric edema versus infection. No definite pleural effusion or pneumothorax. Diffuse osseous demineralization. IMPRESSION: Enlargement of cardiac silhouette post pacemaker with suspect mild pulmonary vascular congestion. Asymmetric infiltrates greater on LEFT, question pneumonia versus asymmetric edema. Electronically Signed   By: Lavonia Dana M.D.   On: 05/27/2017 17:10   Ct Head Wo Contrast  Result Date: 05/27/2017 CLINICAL DATA:   Altered mental status EXAM: CT HEAD WITHOUT CONTRAST TECHNIQUE: Contiguous axial images were obtained from the base of the skull through the vertex without intravenous contrast. COMPARISON:  01/27/2017 FINDINGS: Brain: No acute intracranial abnormality. Specifically, no hemorrhage, hydrocephalus, mass lesion, acute infarction, or significant intracranial injury. Vascular: No hyperdense vessel or unexpected calcification. Skull: No acute calvarial abnormality. Sinuses/Orbits: Visualized paranasal sinuses and mastoids clear. Orbital soft tissues unremarkable. Other: None IMPRESSION: No acute intracranial abnormality. Electronically Signed   By: Rolm Baptise M.D.   On: 05/27/2017 17:44   Dg Hip Unilat With Pelvis 1v Left  Result Date: 05/27/2017 CLINICAL DATA:  Fall EXAM: DG HIP (WITH OR WITHOUT PELVIS) 1V*L* COMPARISON:  None. FINDINGS: SI joints are symmetric. Pubic symphysis is intact. Both femoral heads project in joint. Moderate arthritis of both hips. No definite acute displaced fracture is seen. IMPRESSION: Moderate arthritis.  No definite acute osseous abnormality. Electronically Signed   By: Donavan Foil M.D.   On: 05/27/2017 21:54        Scheduled Meds: . amiodarone  200 mg Oral Daily  . apixaban  2.5 mg Oral BID  . ferrous sulfate  325 mg Oral Daily  . furosemide  60 mg Intravenous BID  . levothyroxine  75 mcg Oral QAC breakfast  . loratadine  10 mg Oral Daily  . mouth rinse  15 mL Mouth Rinse BID  . pantoprazole  40 mg Oral Daily  . polyethylene glycol  17 g Oral Daily  . simvastatin  10 mg Oral QHS  . sodium chloride flush  3 mL Intravenous Q12H   Continuous Infusions: . sodium chloride       LOS: 1 day    Time spent: 22min    Domenic Polite, MD [facility] Pager [phone]  If 7PM-7AM, please contact night-coverage [email or web address] Password [email or web address] 05/28/2017, 11:10 AM

## 2017-05-28 NOTE — Evaluation (Signed)
Physical Therapy Evaluation Patient Details Name: [name] MRN: [id number] DOB: February 26, 1925 Today's Date: 05/28/2017   History of Present Illness  :[name] is a 83 y.o. female with medical history significant of hyperlipidemia, GERD, hypothyroidism, thrombocytopenia, pacemaker placement, dCHF, atrial fib, anemia, dementia, CKD-4, who presents with SOB, AMS, frequent fall.  Clinical Impression  Pt admitted with above diagnosis. Pt currently with functional limitations due to the deficits listed below (see PT Problem List).  Pt will benefit from skilled PT to increase their independence and safety with mobility to allow discharge to the venue listed below.  Pt cognition limiting eval today; recommend SNF at this time, will continue to follow     Follow Up Recommendations SNF (will depend greatly on cognition)    Equipment Recommendations  None recommended by PT    Recommendations for Other Services       Precautions / Restrictions        Mobility  Bed Mobility Overal bed mobility: Needs Assistance Bed Mobility: Supine to Sit;Sit to Supine     Supine to sit: Min assist;Mod assist Sit to supine: Supervision   General bed mobility comments: attempted to assist pt to EOB, pt pulled herself immediately back into supine  Transfers                 General transfer comment: unable d/t cognition  Ambulation/Gait                Stairs            Wheelchair Mobility    Modified Rankin (Stroke Patients Only)       Balance                                             Pertinent Vitals/Pain Pain Assessment: Faces Faces Pain Scale: Hurts little more Pain Descriptors / Indicators: Grimacing;Squeezing Pain Intervention(s): Monitored during session    Home Living Family/patient expects to be discharged to:: Unsure Living Arrangements: Alone Available Help at Discharge: Family;Neighbor Type of Home: House Home  Access: Stairs to enter   [employer] of Steps: Hoople: Environmental consultant - 2 wheels;Walker - 4 wheels;Cane - single point;Shower seat Additional Comments: info regarding home situation adn PLOF taken from previous notes--pt is unable to answer any of these questions and no family present    Prior Function Level of Independence: Independent with assistive device(s)         Comments: Kasandra Knudsen for mobility inside and in community when daughter takes her out     Hand Dominance        Extremity/Trunk Assessment   Upper Extremity Assessment Upper Extremity Assessment: Overall WFL for tasks assessed    Lower Extremity Assessment Lower Extremity Assessment: Overall WFL for tasks assessed (appears from functional obs)       Communication      Cognition Arousal/Alertness: Awake/alert Behavior During Therapy:  (recently biting per sitter) Overall Cognitive Status: History of cognitive impairments - at baseline                                 General Comments: pt repeatedly states "no" to most questions or commands; pt states we are trying to kill her and "my death will be on your soul";  General Comments      Exercises     Assessment/Plan    PT Assessment Patient needs continued PT services  PT Problem List Decreased activity tolerance;Decreased mobility;Decreased cognition       PT Treatment Interventions DME instruction;Functional mobility training;Therapeutic activities;Therapeutic exercise;Patient/family education;Gait training    PT Goals (Current goals can be found in the Care Plan section)  Acute Rehab PT Goals Patient Stated Goal: unable to state PT Goal Formulation: Patient unable to participate in goal setting Time For Goal Achievement: 06/11/17 Potential to Achieve Goals: Fair    Frequency Min 2X/week   Barriers to discharge        Co-evaluation               AM-PAC PT "6 Clicks" Daily Activity  Outcome Measure  Difficulty turning over in bed (including adjusting bedclothes, sheets and blankets)?: Total Difficulty moving from lying on back to sitting on the side of the bed? : Total Difficulty sitting down on and standing up from a chair with arms (e.g., wheelchair, bedside commode, etc,.)?: Total Help needed moving to and from a bed to chair (including a wheelchair)?: Total Help needed walking in hospital room?: Total Help needed climbing 3-5 steps with a railing? : Total 6 Click Score: 6    End of Session   Activity Tolerance: Treatment limited secondary to agitation;Other (comment) (ltd by paranoia, decr insight) Patient left: in bed;with call bell/phone within reach;with bed alarm set;with nursing/sitter in room   PT Visit Diagnosis: History of falling (Z91.81)    Time: 4270-6237 PT Time Calculation (min) (ACUTE ONLY): 12 min   Charges:   PT Evaluation $PT Eval Moderate Complexity: 1 Procedure     PT G CodesKenyon Ana, PT Pager: [phone] 05/28/2017   [facility] 05/28/2017, 1:28 PM

## 2017-05-28 NOTE — Progress Notes (Signed)
  Echocardiogram 2D Echocardiogram has been performed.  [name] 05/28/2017, 10:37 AM

## 2017-05-28 NOTE — Progress Notes (Signed)
OT Cancellation Note  Patient Details Name: WESLEIGH MARKOVIC MRN: [id number] DOB: [birth year]   Cancelled Treatment:    Reason Eval/Treat Not Completed: Other (comment)   Spoke with PT regarding session with pt.  Will attempt OT eval next day or as pt willing/able  Kari Baars, OT [phone]  Payton Mccallum D 05/28/2017, 12:06 PM

## 2017-05-29 LAB — BASIC METABOLIC PANEL
ANION GAP: 10 (ref 5–15)
BUN: 48 mg/dL — ABNORMAL HIGH (ref 6–20)
CALCIUM: 9.7 mg/dL (ref 8.9–10.3)
CO2: 25 mmol/L (ref 22–32)
Chloride: 109 mmol/L (ref 101–111)
Creatinine, Ser: 2.02 mg/dL — ABNORMAL HIGH (ref 0.44–1.00)
GFR, EST AFRICAN AMERICAN: 24 mL/min — AB (ref >60)
GFR, EST NON AFRICAN AMERICAN: 21 mL/min — AB (ref >60)
Glucose, Bld: 172 mg/dL — ABNORMAL HIGH (ref 65–99)
POTASSIUM: 3.8 mmol/L (ref 3.5–5.1)
SODIUM: 144 mmol/L (ref 135–145)

## 2017-05-29 LAB — CBC
HCT: 43.9 % (ref 36.0–46.0)
Hemoglobin: 14.2 g/dL (ref 12.0–15.0)
MCH: 31.6 pg (ref 26.0–34.0)
MCHC: 32.3 g/dL (ref 30.0–36.0)
MCV: 97.8 fL (ref 78.0–100.0)
Platelets: 113 10*3/uL — ABNORMAL LOW (ref 150–400)
RBC: 4.49 MIL/uL (ref 3.87–5.11)
RDW: 14.8 % (ref 11.5–15.5)
WBC: 8.1 10*3/uL (ref 4.0–10.5)

## 2017-05-29 LAB — URINE CULTURE

## 2017-05-29 NOTE — Progress Notes (Signed)
Expand All Collapse All   [] Hide copied text  PROGRESS NOTE    [name]             XNT:[id number] DOB: [birth year] DOA: 05/27/2017 PCP: Burnis Medin, MD  Brief Narrative:Linda A McMullenis a 83 y.o.femalewith medical history significant of hyperlipidemia, GERD, hypothyroidism, thrombocytopenia, MGUS, pacemaker placement, dCHF, atrial fibrillation on Eliquis,anemia, dementia, CKD-4, who presents with SOB, AMS, frequent fall. Confused in the ER, found to have acute on chronic diastolic CHF  Assessment & Plan:  Mild acute on chronic diastolic (congestive) heart failure ([place]) -admitted with mild dyspnea, hypoxia, BNP 459, and mild vascular congestion on chest x-ray, suggesting mild CHF exacerbation. -diuresed with IV lasix, improving, O2 weaned off -bump in creatinine to 2.0, which is still her baseline of 1.8-2, hold lasix today -ECHo with EF of 45% and grade 3 DD  Elevated troponin -mild due to above/demand, flat trend no ACS, no chest pain -ECHO with EF 45%  Atrial Fibrillation:  -CHA2DS2-VASc Scoreis 4,  -rate controlled, continue ELiquis -continue amiodarone  CKD-3-4 -mild bump but still stable at baseline of 1.7-2 range -hold lasix today, Bmet in am  Hypothyroidism: Last TSH was 3.28 on 02/27/17 -Continue home Synthroid  Thrombocytopenia and MGUS:platelet 109 and hgb 14.5.  -stable, monitor  Dyslipidemia:  -continue zocor  Mild Dementia -per Chart review -will need ST Rehab and then long Term ALF  DVT ppx: on Eliquis Code Status:DNR Family Communication: None at bed side, called and d/w dtr Renee  Disposition Plan: needs Rehab  Subjective:feels ok, suspicious abt people/meds etc   Objective:       Vitals:   05/27/17 2043 05/27/17 2100 05/27/17 2241 05/28/17 0521  BP: 130/80 138/80 (!) 155/91 (!) 163/90  Pulse: 89 (!) 44 71 89  Resp: 17   15  Temp:   97.7 F (36.5 C) (!) 97.5 F (36.4 C)  TempSrc:   Oral  Axillary  SpO2: 95% 95% 100% 99%  Weight:   59.7 kg (131 lb 9.8 oz) 57.1 kg (125 lb 14.1 oz)    Intake/Output Summary (Last 24 hours) at 05/28/17 1110 Last data filed at 05/28/17 0600  Gross per 24 hour  Intake              196 ml  Output              775 ml  Net             -579 ml       Filed Weights   05/27/17 2241 05/28/17 0521  Weight: 59.7 kg (131 lb 9.8 oz) 57.1 kg (125 lb 14.1 oz)    Examination:  Gen: somnolent, arousable, no distress, got ativan overnight HEENT: PERRLA, Neck supple, no JVD Lungs: decreased BS at bases CVS: RRR,No Gallops,Rubs or new Murmurs Abd: soft, Non tender, non distended, BS present Extremities: trace edema Skin: no new rashes    Data Reviewed:   CBC:  Last Labs    Recent Labs Lab 05/27/17 1745 05/28/17 0048  WBC 7.2 7.6  NEUTROABS 6.1  --   HGB 14.5 14.2  HCT 43.0 43.6  MCV 95.1 96.9  PLT 109* 108*     Basic Metabolic Panel:  Last Labs    Recent Labs Lab 05/27/17 1745 05/28/17 0048  NA 143 147*  K 3.7 3.8  CL 110 112*  CO2 25 26  GLUCOSE 120* 123*  BUN 36* 35*  CREATININE 1.92* 1.76*  CALCIUM 9.6 9.6  GFR: Estimated Creatinine Clearance: 15.9 mL/min (A) (by C-G formula based on SCr of 1.76 mg/dL (H)). Liver Function Tests:  Last Labs    Recent Labs Lab 05/27/17 1745  AST 23  ALT 18  ALKPHOS 55  BILITOT 1.0  PROT 6.8  ALBUMIN 3.7     Last Labs   No results for input(s): LIPASE, AMYLASE in the last 168 hours.   Last Labs   No results for input(s): AMMONIA in the last 168 hours.   Coagulation Profile: Last Labs   No results for input(s): INR, PROTIME in the last 168 hours.   Cardiac Enzymes:  Last Labs    Recent Labs Lab 05/27/17 1745 05/27/17 2309 05/28/17 0048  TROPONINI 0.05* 0.05* 0.05*     BNP (last 3 results) Recent Labs (within last 365 days)  No results for input(s): PROBNP in the last 8760 hours.   HbA1C: Recent Labs (last 2 labs)   No results  for input(s): HGBA1C in the last 72 hours.   CBG: Last Labs   No results for input(s): GLUCAP in the last 168 hours.   Lipid Profile:  Recent Labs (last 2 labs)    Recent Labs  05/28/17 0048  CHOL 178  HDL 97  LDLCALC 73  TRIG 42  CHOLHDL 1.8     Thyroid Function Tests: Recent Labs (last 2 labs)   No results for input(s): TSH, T4TOTAL, FREET4, T3FREE, THYROIDAB in the last 72 hours.   Anemia Panel: Recent Labs (last 2 labs)   No results for input(s): VITAMINB12, FOLATE, FERRITIN, TIBC, IRON, RETICCTPCT in the last 72 hours.   Urine analysis: Labs (Brief)          Component Value Date/Time   COLORURINE STRAW (A) 05/28/2017 0445   APPEARANCEUR CLEAR 05/28/2017 0445   LABSPEC 1.006 05/28/2017 0445   PHURINE 7.0 05/28/2017 0445   GLUCOSEU NEGATIVE 05/28/2017 0445   GLUCOSEU NEGATIVE 12/19/2011 0956   HGBUR NEGATIVE 05/28/2017 0445   BILIRUBINUR NEGATIVE 05/28/2017 0445   BILIRUBINUR neg 02/27/2017 1618   KETONESUR NEGATIVE 05/28/2017 0445   PROTEINUR NEGATIVE 05/28/2017 0445   UROBILINOGEN 0.2 02/27/2017 1618   UROBILINOGEN 0.2 09/10/2015 1150   NITRITE NEGATIVE 05/28/2017 0445   LEUKOCYTESUR NEGATIVE 05/28/2017 0445     Sepsis Labs: @LABRCNTIP (procalcitonin:4,lacticidven:4)  )No results found for this or any previous visit (from the past 240 hour(s)).       Radiology Studies:  Imaging Results (Last 48 hours)  Dg Chest 2 View  Result Date: 05/27/2017 CLINICAL DATA:  Thinks she fell today but is unsure, history dementia, atrial fibrillation, hypertension, pacemaker EXAM: CHEST  2 VIEW COMPARISON:  03/08/2017 FINDINGS: RIGHT subclavian transvenous pacemaker with leads projecting over a performed and RIGHT ventricle unchanged. Enlargement of cardiac silhouette with suspect mild pulmonary vascular congestion. Atherosclerotic calcification aorta. Rotated to the LEFT. Mediastinal contours otherwise normal. BILATERAL infiltrates greater  on LEFT, question asymmetric edema versus infection. No definite pleural effusion or pneumothorax. Diffuse osseous demineralization. IMPRESSION: Enlargement of cardiac silhouette post pacemaker with suspect mild pulmonary vascular congestion. Asymmetric infiltrates greater on LEFT, question pneumonia versus asymmetric edema. Electronically Signed   By: Lavonia Dana M.D.   On: 05/27/2017 17:10   Ct Head Wo Contrast  Result Date: 05/27/2017 CLINICAL DATA:  Altered mental status EXAM: CT HEAD WITHOUT CONTRAST TECHNIQUE: Contiguous axial images were obtained from the base of the skull through the vertex without intravenous contrast. COMPARISON:  01/27/2017 FINDINGS: Brain: No acute intracranial abnormality. Specifically, no hemorrhage, hydrocephalus,  mass lesion, acute infarction, or significant intracranial injury. Vascular: No hyperdense vessel or unexpected calcification. Skull: No acute calvarial abnormality. Sinuses/Orbits: Visualized paranasal sinuses and mastoids clear. Orbital soft tissues unremarkable. Other: None IMPRESSION: No acute intracranial abnormality. Electronically Signed   By: Rolm Baptise M.D.   On: 05/27/2017 17:44   Dg Hip Unilat With Pelvis 1v Left  Result Date: 05/27/2017 CLINICAL DATA:  Fall EXAM: DG HIP (WITH OR WITHOUT PELVIS) 1V*L* COMPARISON:  None. FINDINGS: SI joints are symmetric. Pubic symphysis is intact. Both femoral heads project in joint. Moderate arthritis of both hips. No definite acute displaced fracture is seen. IMPRESSION: Moderate arthritis.  No definite acute osseous abnormality. Electronically Signed   By: Donavan Foil M.D.   On: 05/27/2017 21:54         Scheduled Meds: . amiodarone  200 mg Oral Daily  . apixaban  2.5 mg Oral BID  . ferrous sulfate  325 mg Oral Daily  . furosemide  60 mg Intravenous BID  . levothyroxine  75 mcg Oral QAC breakfast  . loratadine  10 mg Oral Daily  . mouth rinse  15 mL Mouth Rinse BID  . pantoprazole  40 mg Oral  Daily  . polyethylene glycol  17 g Oral Daily  . simvastatin  10 mg Oral QHS  . sodium chloride flush  3 mL Intravenous Q12H   Continuous Infusions: . sodium chloride       LOS: 1 day    Time spent: 27min    Domenic Polite, MD [facility] Pager [phone]  If 7PM-7AM, please contact night-coverage [email or web address] Password [email or web address] 05/28/2017, 11:10 AM

## 2017-05-29 NOTE — Evaluation (Signed)
Occupational Therapy Evaluation Patient Details Name: [name] MRN: [id number] DOB: 10/06/1925 Today's Date: 05/29/2017    History of Present Illness :[name] is a 83 y.o. female with medical history significant of hyperlipidemia, GERD, hypothyroidism, thrombocytopenia, pacemaker placement, dCHF, atrial fib, anemia, dementia, CKD-4, who presents with SOB, AMS, frequent fall.   Clinical Impression   Pt admitted with AMS. Pt currently with functional limitations due to the deficits listed below (see OT Problem List).  Pt will benefit from skilled OT to increase their safety and independence with ADL and functional mobility for ADL to facilitate discharge to venue listed below.     Follow Up Recommendations  SNF    Equipment Recommendations  None recommended by OT       Precautions / Restrictions Precautions Precautions: Fall      Mobility Bed Mobility Overal bed mobility: Needs Assistance Bed Mobility: Rolling Rolling: Max assist   Supine to sit: Max assist        Transfers                 General transfer comment: did not perform        ADL either performed or assessed with clinical judgement   ADL Overall ADL's : Needs assistance/impaired Eating/Feeding: Moderate assistance Eating/Feeding Details (indicate cue type and reason): pt held cup with both hands with encouragement.  Pt did resist full ROM but OT did observe pt grab covers and hold cup   Grooming Details (indicate cue type and reason): refused                               General ADL Comments: limited eval as pt states no other than drinking     Vision Patient Visual Report: No change from baseline              Pertinent Vitals/Pain Faces Pain Scale: Hurts a little bit Pain Descriptors / Indicators: Grimacing;Squeezing Pain Intervention(s): Limited activity within patient's tolerance        Extremity/Trunk Assessment Upper Extremity  Assessment Upper Extremity Assessment: Generalized weakness           Communication     Cognition Arousal/Alertness: Awake/alert Behavior During Therapy: Agitated Overall Cognitive Status: History of cognitive impairments - at baseline                                     General Comments    cognition limiting pt.  Sitter present . Pt did hold cup and reposition straw in order to drink which was a big improvement           Home Living Family/patient expects to be discharged to:: Skilled nursing facility                                                 OT Problem List: Decreased strength;Decreased activity tolerance;Impaired balance (sitting and/or standing);Decreased cognition;Decreased knowledge of use of DME or AE;Decreased safety awareness      OT Treatment/Interventions: Self-care/ADL training;Patient/family education;DME and/or AE instruction    OT Goals(Current goals can be found in the care plan section) Acute Rehab OT Goals Patient Stated Goal: unable to state OT Goal Formulation: With patient Time For Goal Achievement: 06/12/17  Potential to Achieve Goals: Good ADL Goals Pt Will Perform Eating: with set-up;sitting Pt Will Perform Grooming: with set-up;sitting  OT Frequency: Min 2X/week   Barriers to D/C:               AM-PAC PT "6 Clicks" Daily Activity     Outcome Measure Help from another person eating meals?: A Lot Help from another person taking care of personal grooming?: Total Help from another person toileting, which includes using toliet, bedpan, or urinal?: Total Help from another person bathing (including washing, rinsing, drying)?: Total Help from another person to put on and taking off regular upper body clothing?: Total Help from another person to put on and taking off regular lower body clothing?: Total 6 Click Score: 7   End of Session Nurse Communication: Mobility status  Activity Tolerance: Patient  tolerated treatment well Patient left: in bed;with chair alarm set;with nursing/sitter in room  OT Visit Diagnosis: Unsteadiness on feet (R26.81);Muscle weakness (generalized) (M62.81)                Charges:  OT General Charges $OT Visit: 1 Procedure OT Evaluation $OT Eval Moderate Complexity: 1 Procedure G-Codes:     Kari Baars, OT [phone]  Payton Mccallum D 05/29/2017, 12:53 PM

## 2017-05-29 NOTE — Care Management Note (Signed)
Case Management Note  Patient Details  Name: [name] MRN: [id number] Date of Birth: 1925-08-14  Subjective/Objective:  83 y/o f admitted w/Acute on chronic CHF. From home. PT/OT-SNF. CSW already following.                  Action/Plan:d/c SNF.   Expected Discharge Date:                Expected Discharge Plan:  Skilled Nursing Facility  In-House Referral:  Clinical Social Work  Discharge planning Services  CM Consult  Post Acute Care Choice:    Choice offered to:     DME Arranged:    DME Agency:     HH Arranged:    [place] Agency:     Status of Service:  In process, will continue to follow  If discussed at Long Length of Stay Meetings, dates discussed:    Additional Comments:  Dessa Phi, RN 05/29/2017, 3:51 PM

## 2017-05-30 LAB — BASIC METABOLIC PANEL
Anion gap: 8 (ref 5–15)
BUN: 48 mg/dL — AB (ref 6–20)
CO2: 30 mmol/L (ref 22–32)
CREATININE: 1.77 mg/dL — AB (ref 0.44–1.00)
Calcium: 9.7 mg/dL (ref 8.9–10.3)
Chloride: 104 mmol/L (ref 101–111)
GFR calc Af Amer: 28 mL/min — ABNORMAL LOW (ref >60)
GFR, EST NON AFRICAN AMERICAN: 24 mL/min — AB (ref >60)
Glucose, Bld: 129 mg/dL — ABNORMAL HIGH (ref 65–99)
Potassium: 4.2 mmol/L (ref 3.5–5.1)
SODIUM: 142 mmol/L (ref 135–145)

## 2017-05-30 LAB — HEMOGLOBIN A1C
HEMOGLOBIN A1C: 5.7 % — AB (ref 4.8–5.6)
MEAN PLASMA GLUCOSE: 117 mg/dL

## 2017-05-30 MED ORDER — TAMSULOSIN HCL 0.4 MG PO CAPS
0.4000 mg | ORAL_CAPSULE | Freq: Once | ORAL | Status: AC
Start: 2017-05-30 — End: 2017-05-30
  Administered 2017-05-30: 0.4 mg via ORAL
  Filled 2017-05-30: qty 1

## 2017-05-30 MED ORDER — FUROSEMIDE 40 MG PO TABS
40.0000 mg | ORAL_TABLET | Freq: Every day | ORAL | Status: DC
  Administered 2017-05-30 – 2017-06-01 (×3): 40 mg via ORAL
  Filled 2017-05-30 (×3): qty 1

## 2017-05-30 NOTE — Progress Notes (Addendum)
Expand All Collapse All   [] Hide copied text  PROGRESS NOTE    [name]             HER:[id number] DOB: 1925-03-20 DOA: 05/27/2017 PCP: Burnis Medin, MD  Brief Narrative:Linda A McMullenis a 83 y.o.femalewith medical history significant of hyperlipidemia, GERD, hypothyroidism, thrombocytopenia, MGUS, pacemaker placement, dCHF, atrial fibrillation on Eliquis,anemia, dementia, CKD-4, who was sent to ER by daughter due to SOB, AMS, frequent fall. Confused in the ER, found to have acute on chronic diastolic CHF. Dispo challenging  Assessment & Plan:  Mild acute on chronic diastolic (congestive) heart failure (HCC) -admitted with mild dyspnea, hypoxia, BNP 459, and mild vascular congestion on chest x-ray, suggesting mild CHF exacerbation. -diuresed with IV lasix, improving, O2 weaned off -bump in creatinine to 2.0 yesterday, which is still her baseline of 1.8-2, held lasix yesterday, resume PO now -ECHo with EF of 45% and grade 3 DD -negative 2.5L, appears euvolemic now  Elevated troponin -mild due to above/demand, flat trend no ACS, no chest pain -ECHO with EF 45%  Atrial Fibrillation:  -CHA2DS2-VASc Scoreis 4,  -rate controlled, continue ELiquis -continue amiodarone  CKD-3-4 -mild bump but still stable at baseline of 1.7-2 range -creatinine stable now, resume Po lasix  Hypothyroidism: Last TSH was 3.28 on 02/27/17 -Continue home Synthroid  Thrombocytopenia and MGUS:platelet 109 and hgb 14.5.  -stable, monitor  Dyslipidemia:  -continue zocor  Mild Dementia -and requiring significant assistance in hospital, also having issues with urinary retention while shes bed bound, recommended ST SNF to patient and daughter yesterday -pt thinks she can manage alone at home, in my opinion this is very unsafe unless 24x7 caregivers can be aranged -CM and CSW consulted  Urinary retention -likely due to overall debility/bed bound status, asked staff to get  her up to bedside commode today -given occasional low BPs, would be poor long term candidate for alpha blockers like Tamsulosin, give x1 dose today  DVT ppx: on Eliquis Code Status:DNR Family Communication: None at bed side, called and d/w dtr [name]  Disposition Plan: needs Rehab  Subjective:feels better, difficulty urinating, wants to go home, breathing better   Objective:       Vitals:   05/27/17 2043 05/27/17 2100 05/27/17 2241 05/28/17 0521  BP: 130/80 138/80 (!) 155/91 (!) 163/90  Pulse: 89 (!) 44 71 89  Resp: 17   15  Temp:   97.7 F (36.5 C) (!) 97.5 F (36.4 C)  TempSrc:   Oral Axillary  SpO2: 95% 95% 100% 99%  Weight:   59.7 kg (131 lb 9.8 oz) 57.1 kg (125 lb 14.1 oz)    Intake/Output Summary (Last 24 hours) at 05/28/17 1110 Last data filed at 05/28/17 0600  Gross per 24 hour  Intake              196 ml  Output              775 ml  Net             -579 ml       Filed Weights   05/27/17 2241 05/28/17 0521  Weight: 59.7 kg (131 lb 9.8 oz) 57.1 kg (125 lb 14.1 oz)    Examination: Gen: Awake, more alert, oriented to self and place HEENT: PERRLA, Neck supple, no JVD Lungs: decreased BS at bases CVS: S1S2/RRR Abd: soft, Non tender, non distended, BS present Extremities: No Cyanosis, Clubbing or edema Skin: no new rashes    Data Reviewed:  CBC:  Last Labs    Recent Labs Lab 05/27/17 1745 05/28/17 0048  WBC 7.2 7.6  NEUTROABS 6.1  --   HGB 14.5 14.2  HCT 43.0 43.6  MCV 95.1 96.9  PLT 109* 108*     Basic Metabolic Panel:  Last Labs    Recent Labs Lab 05/27/17 1745 05/28/17 0048  NA 143 147*  K 3.7 3.8  CL 110 112*  CO2 25 26  GLUCOSE 120* 123*  BUN 36* 35*  CREATININE 1.92* 1.76*  CALCIUM 9.6 9.6     GFR: Estimated Creatinine Clearance: 15.9 mL/min (A) (by C-G formula based on SCr of 1.76 mg/dL (H)). Liver Function Tests:  Last Labs    Recent Labs Lab 05/27/17 1745  AST 23  ALT 18  ALKPHOS  55  BILITOT 1.0  PROT 6.8  ALBUMIN 3.7     Last Labs   No results for input(s): LIPASE, AMYLASE in the last 168 hours.   Last Labs   No results for input(s): AMMONIA in the last 168 hours.   Coagulation Profile: Last Labs   No results for input(s): INR, PROTIME in the last 168 hours.   Cardiac Enzymes:  Last Labs    Recent Labs Lab 05/27/17 1745 05/27/17 2309 05/28/17 0048  TROPONINI 0.05* 0.05* 0.05*     BNP (last 3 results) Recent Labs (within last 365 days)  No results for input(s): PROBNP in the last 8760 hours.   HbA1C: Recent Labs (last 2 labs)   No results for input(s): HGBA1C in the last 72 hours.   CBG: Last Labs   No results for input(s): GLUCAP in the last 168 hours.   Lipid Profile:  Recent Labs (last 2 labs)    Recent Labs  05/28/17 0048  CHOL 178  HDL 97  LDLCALC 73  TRIG 42  CHOLHDL 1.8     Thyroid Function Tests: Recent Labs (last 2 labs)   No results for input(s): TSH, T4TOTAL, FREET4, T3FREE, THYROIDAB in the last 72 hours.   Anemia Panel: Recent Labs (last 2 labs)   No results for input(s): VITAMINB12, FOLATE, FERRITIN, TIBC, IRON, RETICCTPCT in the last 72 hours.   Urine analysis: Labs (Brief)          Component Value Date/Time   COLORURINE STRAW (A) 05/28/2017 0445   APPEARANCEUR CLEAR 05/28/2017 0445   LABSPEC 1.006 05/28/2017 0445   PHURINE 7.0 05/28/2017 0445   GLUCOSEU NEGATIVE 05/28/2017 0445   GLUCOSEU NEGATIVE 12/19/2011 0956   HGBUR NEGATIVE 05/28/2017 0445   BILIRUBINUR NEGATIVE 05/28/2017 0445   BILIRUBINUR neg 02/27/2017 1618   KETONESUR NEGATIVE 05/28/2017 0445   PROTEINUR NEGATIVE 05/28/2017 0445   UROBILINOGEN 0.2 02/27/2017 1618   UROBILINOGEN 0.2 09/10/2015 1150   NITRITE NEGATIVE 05/28/2017 0445   LEUKOCYTESUR NEGATIVE 05/28/2017 0445     Sepsis Labs: @LABRCNTIP (procalcitonin:4,lacticidven:4)  )No results found for this or any previous visit (from the past 240 hour(s)).         Radiology Studies:  Imaging Results (Last 48 hours)  Dg Chest 2 View  Result Date: 05/27/2017 CLINICAL DATA:  Thinks she fell today but is unsure, history dementia, atrial fibrillation, hypertension, pacemaker EXAM: CHEST  2 VIEW COMPARISON:  03/08/2017 FINDINGS: RIGHT subclavian transvenous pacemaker with leads projecting over a performed and RIGHT ventricle unchanged. Enlargement of cardiac silhouette with suspect mild pulmonary vascular congestion. Atherosclerotic calcification aorta. Rotated to the LEFT. Mediastinal contours otherwise normal. BILATERAL infiltrates greater on LEFT, question asymmetric edema versus infection. No  definite pleural effusion or pneumothorax. Diffuse osseous demineralization. IMPRESSION: Enlargement of cardiac silhouette post pacemaker with suspect mild pulmonary vascular congestion. Asymmetric infiltrates greater on LEFT, question pneumonia versus asymmetric edema. Electronically Signed   By: Lavonia Dana M.D.   On: 05/27/2017 17:10   Ct Head Wo Contrast  Result Date: 05/27/2017 CLINICAL DATA:  Altered mental status EXAM: CT HEAD WITHOUT CONTRAST TECHNIQUE: Contiguous axial images were obtained from the base of the skull through the vertex without intravenous contrast. COMPARISON:  01/27/2017 FINDINGS: Brain: No acute intracranial abnormality. Specifically, no hemorrhage, hydrocephalus, mass lesion, acute infarction, or significant intracranial injury. Vascular: No hyperdense vessel or unexpected calcification. Skull: No acute calvarial abnormality. Sinuses/Orbits: Visualized paranasal sinuses and mastoids clear. Orbital soft tissues unremarkable. Other: None IMPRESSION: No acute intracranial abnormality. Electronically Signed   By: Rolm Baptise M.D.   On: 05/27/2017 17:44   Dg Hip Unilat With Pelvis 1v Left  Result Date: 05/27/2017 CLINICAL DATA:  Fall EXAM: DG HIP (WITH OR WITHOUT PELVIS) 1V*L* COMPARISON:  None. FINDINGS: SI joints are symmetric.  Pubic symphysis is intact. Both femoral heads project in joint. Moderate arthritis of both hips. No definite acute displaced fracture is seen. IMPRESSION: Moderate arthritis.  No definite acute osseous abnormality. Electronically Signed   By: Donavan Foil M.D.   On: 05/27/2017 21:54         Scheduled Meds: . amiodarone  200 mg Oral Daily  . apixaban  2.5 mg Oral BID  . ferrous sulfate  325 mg Oral Daily  . furosemide  60 mg Intravenous BID  . levothyroxine  75 mcg Oral QAC breakfast  . loratadine  10 mg Oral Daily  . mouth rinse  15 mL Mouth Rinse BID  . pantoprazole  40 mg Oral Daily  . polyethylene glycol  17 g Oral Daily  . simvastatin  10 mg Oral QHS  . sodium chloride flush  3 mL Intravenous Q12H   Continuous Infusions: . sodium chloride       LOS: 1 day    Time spent: 72min    Domenic Polite, MD [facility] Pager [phone]  If 7PM-7AM, please contact night-coverage [email or web address] Password [email or web address] 05/28/2017, 11:10 AM

## 2017-05-31 DIAGNOSIS — I5033 Acute on chronic diastolic (congestive) heart failure: Secondary | ICD-10-CM

## 2017-05-31 DIAGNOSIS — G9341 Metabolic encephalopathy: Secondary | ICD-10-CM

## 2017-05-31 LAB — BASIC METABOLIC PANEL
Anion gap: 8 (ref 5–15)
BUN: 41 mg/dL — AB (ref 6–20)
CALCIUM: 9.8 mg/dL (ref 8.9–10.3)
CO2: 28 mmol/L (ref 22–32)
CREATININE: 1.57 mg/dL — AB (ref 0.44–1.00)
Chloride: 107 mmol/L (ref 101–111)
GFR calc non Af Amer: 28 mL/min — ABNORMAL LOW (ref >60)
GFR, EST AFRICAN AMERICAN: 32 mL/min — AB (ref >60)
Glucose, Bld: 115 mg/dL — ABNORMAL HIGH (ref 65–99)
Potassium: 4.5 mmol/L (ref 3.5–5.1)
SODIUM: 143 mmol/L (ref 135–145)

## 2017-05-31 NOTE — Care Management Important Message (Signed)
Important Message  Patient Details  Name: DENYCE HARR MRN: [id number] Date of Birth: 08/20/1925   Medicare Important Message Given:  Yes    Kerin Salen 05/31/2017, 11:25 [place] Message  Patient Details  Name: CHRYS LANDGREBE MRN: [id number] Date of Birth: 1925-09-15   Medicare Important Message Given:  Yes    Kerin Salen 05/31/2017, 11:25 AM

## 2017-05-31 NOTE — Clinical Social Work Note (Signed)
Clinical Social Work Assessment  Patient Details  Name: [name] MRN: [id number] Date of Birth: 1925-08-05  Date of referral:  05/31/17               Reason for consult:  Facility Placement                Permission sought to share information with:  Chartered certified accountant granted to share information::  Yes, Verbal Permission Granted  Name::        Agency::     Relationship::     Contact Information:     Housing/Transportation Living arrangements for the past 2 months:  Single Family Home Source of Information:  Adult Children (Daughter Tax inspector) Patient Interpreter Needed:  None Criminal Activity/Legal Involvement Pertinent to Current Situation/Hospitalization:  No - Comment as needed Significant Relationships:  Adult Children Lives with:  Self Do you feel safe going back to the place where you live?  No Need for family participation in patient care:  Yes (Comment)  Care giving concerns:  Patient resides alone and daughter reports patient is unable to care for herself at this time. PT recommended ST rehab at SNF.   Social Worker assessment / plan:  CSW spoke with patient's daughter [name] [phone]) and discussed PT recommendation for ST rehab at SNF. Patient's daughter agreeable and reports that patient has been to [facility] in 2015. Patient's daughter reported that she prefers [facility] if possible. Patient's daughter reported that patient has some assistance in the home including, 3 days/week cleaning and neighbors that are supportive. Patient's daughter reported that she goes to the patient's home at night and takes her to dinner. CSW will complete FL2 and start insurance authorization. CSW will continue to follow and assist with discharge planning.   Employment status:  Retired Nurse, adult PT Recommendations:  [place] / Referral to community resources:  [place]  Patient/Family's Response to care:  Patient's daughter agreeable to [place] [facility] at [facility].   Patient/Family's Understanding of and Emotional Response to Diagnosis, Current Treatment, and Prognosis:  Patient's daughter verbalized understanding of plan to discharge to SNF for ST rehab. Patient's daughter reported that she would like the patient to return to her home after ST rehab, if possible.   Emotional Assessment Appearance:    Attitude/Demeanor/Rapport:  Unable to Assess Affect (typically observed):  Unable to Assess Orientation:  Oriented to Self, Oriented to Place Alcohol / Substance use:  Not Applicable Psych involvement (Current and /or in the community):  No (Comment)  Discharge Needs  Concerns to be addressed:  No discharge needs identified Readmission within the last 30 days:  No Current discharge risk:  None Barriers to Discharge:  Other (Sitter discontinued waiting 24 hours to be able to discharge to facility)   Burnis Medin, LCSW 05/31/2017, 2:46 PM

## 2017-05-31 NOTE — Discharge Summary (Addendum)
Physician Discharge Summary  [name] KKX:[id number] DOB: 02/02/1925 DOA: 05/27/2017  PCP: Burnis Medin, MD  Admit date: 05/27/2017 Discharge date: 06/01/2017  Admitted From: Home Disposition:  SNF  Recommendations for Outpatient Follow-up:  1. Follow up with PCP in 1 week 2. Follow up with Dr. Garen Grams, Neurology, in 1-2 weeks for dementia  3. Follow up with Dr. Haroldine Laws, Cardiology, in 1 week for CHF  4. Please obtain BMP/CBC in 1 week   Discharge Condition: Stable CODE STATUS: DNR  Diet recommendation: Heart healthy   Brief/Interim Summary: HPI per Dr. Blaine Hamper: [name] is a 83 y.o. female with medical history significant of hyperlipidemia, GERD, hypothyroidism, thrombocytopenia, MGUS, pacemaker placement, dCHF, atrial fibrillation on Eliquis, anemia, dementia, CKD-4, who presents with SOB, AMS, frequent fall. Pt has dementia, it is difficult to get accurate history from patient herself. Per report, pt presents under IVC by family due to inability to take care of herself. According to the petition, patient becomes confused in the past two days and has not been taking her medications appropriately or care of her basic needs. She lives alone at home. Pt has had fall twice at home, not sure if she has any injury. She seems to have little guarding to her left hip on examination.The patient herself denies any complaints at this time, but she was noted to have respiratory distress. She denies chest pain. No active cough, nausea, vomiting, diarrhea noted. Patient moves all extremities. No facial droop or slurred speech noted. No fever or chills. She has home health 3 times a week per report. Per report, when EMS arrived initially, she refused transport and her daughter had the placement IVC to be transported here.  ED Course: Pt was found to have BNP 459, troponin 0.05, stable renal function, oxygen saturation 100% on room air, chest x-ray showed a mild vascular congestion.  Pending UA. CT head is negative for acute intracranial abnormalities. Patient is admitted to telemetry bed as inpatient.  Mild acute on chronic diastolic congestive heart failure  -Admitted with mild dyspnea, hypoxia, BNP 459, and mild vascular congestion on chest x-ray, suggesting mild CHF exacerbation. -Diuresed with IV lasix, improving, O2 weaned off -Echo with EF of 45% and grade 3 DD -Has had > 3.5L urine output this admission  -Resume home demadex daily and prn zaroxolyn. Follow up with Dr. Haroldine Laws.   Elevated troponin -Mild due to above/demand, flat trend no ACS, no chest pain  Atrial Fibrillation:  -CHA2DS2-VASc Scoreis 4 -Rate controlled, continue Eliquis, amiodarone  CKD stage 3-4 -Cr aseline of 1.7-2 range -Stable   Hypothyroidism: Last TSH was 3.28 on 02/27/17 -Continue home Synthroid  Thrombocytopenia and MGUS -Stable, monitor  Dyslipidemia -Continue zocor  Mild Dementia -Follows with Neurology as outpatient  Urinary retention -Likely due to overall debility/bed bound status -Given occasional low BPs, would be poor long term candidate for alpha blockers like Tamsulosin -Continue prn bladder scan, in and out cath as needed for postvoid residual > 331mL  Discharge Instructions  Discharge Instructions    (HEART FAILURE PATIENTS) Call MD:  Anytime you have any of the following symptoms: 1) 3 pound weight gain in 24 hours or 5 pounds in 1 week 2) shortness of breath, with or without a dry hacking cough 3) swelling in the hands, feet or stomach 4) if you have to sleep on extra pillows at night in order to breathe.    Complete by:  As directed    Call MD for:  difficulty  breathing, headache or visual disturbances    Complete by:  As directed    Call MD for:  extreme fatigue    Complete by:  As directed    Call MD for:  hives    Complete by:  As directed    Call MD for:  persistant dizziness or light-headedness    Complete by:  As directed    Call MD  for:  persistant nausea and vomiting    Complete by:  As directed    Call MD for:  severe uncontrolled pain    Complete by:  As directed    Call MD for:  temperature >100.4    Complete by:  As directed    Diet - low sodium heart healthy    Complete by:  As directed    Discharge instructions    Complete by:  As directed    You were cared for by a hospitalist during your hospital stay. If you have any questions about your discharge medications or the care you received while you were in the hospital after you are discharged, you can call the unit and asked to speak with the hospitalist on call if the hospitalist that took care of you is not available. Once you are discharged, your primary care physician will handle any further medical issues. Please note that NO REFILLS for any discharge medications will be authorized once you are discharged, as it is imperative that you return to your primary care physician (or establish a relationship with a primary care physician if you do not have one) for your aftercare needs so that they can reassess your need for medications and monitor your lab values.   Increase activity slowly    Complete by:  As directed      Allergies as of 05/31/2017      Reactions   Other Shortness Of Breath   Detergents, perfumes and other strong odor emitting compounds   Tramadol Shortness Of Breath, Nausea Only   Ibuprofen Other (See Comments)   Told not to take med      Medication List    STOP taking these medications   HYDROcodone-acetaminophen 5-325 MG tablet Commonly known as:  NORCO/VICODIN     TAKE these medications   amiodarone 200 MG tablet Commonly known as:  PACERONE Take 1 tablet (200 mg total) by mouth daily.   baclofen 10 MG tablet Commonly known as:  LIORESAL Take 10 mg by mouth 2 (two) times daily as needed for muscle spasms.   denosumab 60 MG/ML Soln injection Commonly known as:  PROLIA Inject 60 mg into the skin every 6 (six) months. Administer  in upper arm, thigh, or abdomen   ELIQUIS 2.5 MG Tabs tablet Generic drug:  apixaban TAKE ONE TABLET BY MOUTH TWICE DAILY   Iron 325 (65 Fe) MG Tabs Take 1 tablet by mouth daily.   levothyroxine 75 MCG tablet Commonly known as:  SYNTHROID, LEVOTHROID TAKE 1 TABLET BY MOUTH ONCE DAILY   loratadine 10 MG tablet Commonly known as:  CLARITIN Take 1 tablet (10 mg total) by mouth daily. What changed:  when to take this  reasons to take this   meclizine 12.5 MG tablet Commonly known as:  ANTIVERT Take 1 tablet (12.5 mg total) by mouth 2 (two) times daily as needed for dizziness.   metolazone 2.5 MG tablet Commonly known as:  ZAROXOLYN Take 1 tablet (2.5 mg total) by mouth as needed (for weight 134 lb or greater).   nitroGLYCERIN  0.4 MG SL tablet Commonly known as:  NITROSTAT Place 1 tablet (0.4 mg total) under the tongue every 5 (five) minutes as needed. For chest pain   pantoprazole 40 MG tablet Commonly known as:  PROTONIX Take 40 mg by mouth daily.   polyethylene glycol packet Commonly known as:  MIRALAX Take 17 g by mouth daily. Mix with 8 ounces of liquid. What changed:  when to take this  reasons to take this  additional instructions   potassium chloride SA 20 MEQ tablet Commonly known as:  K-DUR,KLOR-CON Take 2 tablets (40 mEq total) by mouth daily. Take an additional 48meq with Metolazone.   senna 8.6 MG Tabs tablet Commonly known as:  SENOKOT Take 2 tablets (17.2 mg total) by mouth at bedtime. What changed:  when to take this  reasons to take this   simvastatin 10 MG tablet Commonly known as:  ZOCOR TAKE ONE TABLET BY MOUTH AT BEDTIME   torsemide 20 MG tablet Commonly known as:  DEMADEX Take 2 tablets (40 mg total) by mouth daily. Please start on 11/22/2016      Follow-up Information    Panosh, Standley Brooking, MD. Schedule an appointment as soon as possible for a visit in 1 week(s).   Specialties:  Internal Medicine, Pediatrics Contact  information: [place] [address] [phone]        Penni Bombard, MD. Schedule an appointment as soon as possible for a visit in 1 week(s).   Specialties:  Neurology, Radiology Why:  Follow up mild dementia  Contact information: [address] [place] [address] [phone]        Bensimhon, Shaune Pascal, MD. Schedule an appointment as soon as possible for a visit in 1 week(s).   Specialty:  Cardiology Why:  Follow up CHF  Contact information: [place] [address] [phone]          Allergies  Allergen Reactions  . Other Shortness Of Breath    Detergents, perfumes and other strong odor emitting compounds  . Tramadol Shortness Of Breath and Nausea Only  . Ibuprofen Other (See Comments)    Told not to take med    Consultations:  None   Procedures/Studies: Dg Chest 2 View  Result Date: 05/27/2017 CLINICAL DATA:  Thinks she fell today but is unsure, history dementia, atrial fibrillation, hypertension, pacemaker EXAM: CHEST  2 VIEW COMPARISON:  03/08/2017 FINDINGS: RIGHT subclavian transvenous pacemaker with leads projecting over a performed and RIGHT ventricle unchanged. Enlargement of cardiac silhouette with suspect mild pulmonary vascular congestion. Atherosclerotic calcification aorta. Rotated to the LEFT. Mediastinal contours otherwise normal. BILATERAL infiltrates greater on LEFT, question asymmetric edema versus infection. No definite pleural effusion or pneumothorax. Diffuse osseous demineralization. IMPRESSION: Enlargement of cardiac silhouette post pacemaker with suspect mild pulmonary vascular congestion. Asymmetric infiltrates greater on LEFT, question pneumonia versus asymmetric edema. Electronically Signed   By: Lavonia Dana M.D.   On: 05/27/2017 17:10   Ct Head Wo Contrast  Result Date: 05/27/2017 CLINICAL DATA:  Altered mental status EXAM: CT HEAD WITHOUT CONTRAST TECHNIQUE:  Contiguous axial images were obtained from the base of the skull through the vertex without intravenous contrast. COMPARISON:  01/27/2017 FINDINGS: Brain: No acute intracranial abnormality. Specifically, no hemorrhage, hydrocephalus, mass lesion, acute infarction, or significant intracranial injury. Vascular: No hyperdense vessel or unexpected calcification. Skull: No acute calvarial abnormality. Sinuses/Orbits: Visualized paranasal sinuses and mastoids clear. Orbital soft tissues unremarkable. Other: None IMPRESSION: No acute  intracranial abnormality. Electronically Signed   By: Rolm Baptise M.D.   On: 05/27/2017 17:44   Dg Hip Unilat With Pelvis 1v Left  Result Date: 05/27/2017 CLINICAL DATA:  Fall EXAM: DG HIP (WITH OR WITHOUT PELVIS) 1V*L* COMPARISON:  None. FINDINGS: SI joints are symmetric. Pubic symphysis is intact. Both femoral heads project in joint. Moderate arthritis of both hips. No definite acute displaced fracture is seen. IMPRESSION: Moderate arthritis.  No definite acute osseous abnormality. Electronically Signed   By: Donavan Foil M.D.   On: 05/27/2017 21:54    Echo 05/28/2017 Study Conclusions  - Left ventricle: The cavity size was normal. Wall thickness was   increased in a pattern of mild LVH. Systolic function was mildly   reduced. The estimated ejection fraction was in the range of 45%   to 50%. There is akinesis of the mid-apicalinferoseptal   myocardium. Doppler parameters are consistent with a reversible   restrictive pattern, indicative of decreased left ventricular   diastolic compliance and/or increased left atrial pressure (grade   3 diastolic dysfunction). - Aortic valve: Valve mobility was restricted. There was mild   regurgitation. - Mitral valve: Calcified annulus. Mildly thickened leaflets .   There was mild regurgitation. - Left atrium: The atrium was moderately dilated. - Right ventricle: Pacer wire or catheter noted in right ventricle. - Pulmonary  arteries: Systolic pressure was mildly increased. PA   peak pressure: 35 mm Hg (S).  Impressions:  - Mitral regurgitation does not appear as severe as previous   echocardiogram demonstrates (personally viewed).   Discharge Exam: Vitals:   05/30/17 2055 05/31/17 0503  BP: 114/63 118/90  Pulse: 77 71  Resp: 20 18  Temp: 97.6 F (36.4 C) 97.6 F (36.4 C)   Vitals:   05/30/17 1615 05/30/17 2055 05/31/17 0503 05/31/17 0520  BP: (!) 109/53 114/63 118/90   Pulse: 69 77 71   Resp: 19 20 18    Temp: 97.9 F (36.6 C) 97.6 F (36.4 C) 97.6 F (36.4 C)   TempSrc: Oral Oral Oral   SpO2: 95% 98% 98%   Weight:    59.3 kg (130 lb 11.7 oz)    General: Pt is alert, awake, not in acute distress Cardiovascular: S1/S2 +, no rubs, no gallops Respiratory: CTA bilaterally, no wheezing, no rhonchi Abdominal: Soft, NT, ND, bowel sounds + Extremities: no edema, no cyanosis, +erythema bilateral hands, states this is chronic and doctor calls her "strawberry hands"  Neuro: mild confusion, speech fluent     The results of significant diagnostics from this hospitalization (including imaging, microbiology, ancillary and laboratory) are listed below for reference.     Microbiology: Recent Results (from the past 240 hour(s))  Urine Culture     Status: Abnormal   Collection Time: 05/28/17  4:45 AM  Result Value Ref Range Status   Specimen Description URINE, RANDOM  Final   Special Requests NONE  Final   Culture (A)  Final    <10,000 COLONIES/mL INSIGNIFICANT GROWTH Performed at [facility], [address] [place], [place] [address]    Report Status 05/29/2017 FINAL  Final     Labs: BNP (last 3 results)  Recent Labs  11/02/16 0439 11/11/16 1907 05/27/17 1745  BNP 292.6* 264.3* 149.7*   Basic Metabolic Panel:  Recent Labs Lab 05/27/17 1745 05/28/17 0048 05/29/17 0415 05/30/17 0428 05/31/17 0407  NA 143 147* 144 142 143  K 3.7 3.8 3.8 4.2 4.5  CL 110 112* 109 104 107  CO2 25 26 25 30 28   GLUCOSE 120* 123* 172* 129* 115*  BUN 36* 35* 48* 48* 41*  CREATININE 1.92* 1.76* 2.02* 1.77* 1.57*  CALCIUM 9.6 9.6 9.7 9.7 9.8   Liver Function Tests:  Recent Labs Lab 05/27/17 1745  AST 23  ALT 18  ALKPHOS 55  BILITOT 1.0  PROT 6.8  ALBUMIN 3.7   No results for input(s): LIPASE, AMYLASE in the last 168 hours. No results for input(s): AMMONIA in the last 168 hours. CBC:  Recent Labs Lab 05/27/17 1745 05/28/17 0048 05/29/17 0415  WBC 7.2 7.6 8.1  NEUTROABS 6.1  --   --   HGB 14.5 14.2 14.2  HCT 43.0 43.6 43.9  MCV 95.1 96.9 97.8  PLT 109* 108* 113*   Cardiac Enzymes:  Recent Labs Lab 05/27/17 1745 05/27/17 2309 05/28/17 0048  TROPONINI 0.05* 0.05* 0.05*   BNP: Invalid input(s): POCBNP CBG: No results for input(s): GLUCAP in the last 168 hours. D-Dimer No results for input(s): DDIMER in the last 72 hours. Hgb A1c No results for input(s): HGBA1C in the last 72 hours. Lipid Profile No results for input(s): CHOL, HDL, LDLCALC, TRIG, CHOLHDL, LDLDIRECT in the last 72 hours. Thyroid function studies No results for input(s): TSH, T4TOTAL, T3FREE, THYROIDAB in the last 72 hours.  Invalid input(s): FREET3 Anemia work up No results for input(s): VITAMINB12, FOLATE, FERRITIN, TIBC, IRON, RETICCTPCT in the last 72 hours. Urinalysis    Component Value Date/Time   COLORURINE STRAW (A) 05/28/2017 0445   APPEARANCEUR CLEAR 05/28/2017 0445   LABSPEC 1.006 05/28/2017 0445   PHURINE 7.0 05/28/2017 0445   GLUCOSEU NEGATIVE 05/28/2017 0445   GLUCOSEU NEGATIVE 12/19/2011 0956   HGBUR NEGATIVE 05/28/2017 0445   BILIRUBINUR NEGATIVE 05/28/2017 0445   BILIRUBINUR neg 02/27/2017 1618   KETONESUR NEGATIVE 05/28/2017 0445   PROTEINUR NEGATIVE 05/28/2017 0445   UROBILINOGEN 0.2 02/27/2017 1618   UROBILINOGEN 0.2 09/10/2015 1150   NITRITE NEGATIVE 05/28/2017 0445   LEUKOCYTESUR NEGATIVE 05/28/2017 0445   Sepsis Labs Invalid input(s):  PROCALCITONIN,  WBC,  LACTICIDVEN Microbiology Recent Results (from the past 240 hour(s))  Urine Culture     Status: Abnormal   Collection Time: 05/28/17  4:45 AM  Result Value Ref Range Status   Specimen Description URINE, RANDOM  Final   Special Requests NONE  Final   Culture (A)  Final    <10,000 COLONIES/mL INSIGNIFICANT GROWTH Performed at [facility], [place] [address], [place], [place] [address]    Report Status 05/29/2017 FINAL  Final     Time coordinating discharge: 45 minutes  SIGNED:  Dessa Phi, DO [facility] Pager [phone]  If 7PM-7AM, please contact night-coverage [email or web address] Password [facility] 05/31/2017, 11:43 AM

## 2017-05-31 NOTE — Progress Notes (Signed)
   As patient admitted under involuntary commitment, she will need to have sitter discontinued for 24 hours prior to discharge to SNF. Discontinue sitter now, 12:24pm.    Dessa Phi, DO [facility] [email or web address] Password [facility] 05/31/2017, 12:23 PM

## 2017-05-31 NOTE — Progress Notes (Signed)
CSW contacted by [facility], patient received insurance authorization for 06/01/2017 next review date is 06/03/2017. Authorization # [id number] [place] [facility].   Abundio Miu, [place] Social Worker [facility] Cell#: [phone]

## 2017-05-31 NOTE — NC FL2 (Signed)
[place] LEVEL OF CARE SCREENING TOOL     IDENTIFICATION  Patient Name: [name] Birthdate: 1925-08-10 Sex: female Admission Date (Current Location): 05/27/2017  [facility] and [place] Number:  Herbalist and Address:  [facility],  [place], [place]      Provider Number: [id number]  Attending Physician Name and Address:  Dessa Phi Chahn-Yan*  Relative Name and Phone Number:       Current Level of Care: Hospital Recommended Level of Care: [place] Prior Approval Number:    Date Approved/Denied:   PASRR Number: [id number]  Discharge Plan: SNF    Current Diagnoses: Patient Active Problem List   Diagnosis Date Noted  . Acute on chronic diastolic (congestive) heart failure ([place]) 05/27/2017  . Fall 05/27/2017  . Acute metabolic encephalopathy 62/83/1517  . Mitral regurgitation 03/10/2017  . Mitral stenosis 03/10/2017  . Chest pain 03/08/2017  . Dehydration 11/18/2016  . Intractable nausea and vomiting 11/18/2016  . Generalized weakness 11/18/2016  . Acute kidney injury superimposed on chronic kidney disease ([place]) 11/11/2016  . Hypokalemia 11/11/2016  . Hypertensive heart disease 04/01/2016  . PAF (paroxysmal atrial fibrillation) ([place]) 03/31/2016  . Chest pain with moderate risk for cardiac etiology, negative MI and Nuc study, + muscular skeletal   . Atypical chest pain 01/21/2016  . CKD (chronic kidney disease) stage 4, GFR 15-29 ml/min (HCC) 05/09/2015  . Anemia of chronic disease 05/09/2015  . Dyslipidemia 05/09/2015  . Elevated troponin   . Iron deficiency anemia 11/21/2014  . Pacemaker 04/27/2014  . Chronic anticoagulation 02/25/2014  . Chronic diastolic CHF (congestive heart failure) ([place]) 02/24/2014  . Atrial fibrillation ([place]) 01/29/2014  . MGUS (monoclonal gammopathy of unknown significance) 08/01/2013  . Thrombocytopenia ([place])   . Hypothyroidism 02/19/2012  .  Hyperlipidemia 12/15/2008  . HYPERTENSION, BENIGN 12/15/2008    Orientation RESPIRATION BLADDER Height & Weight     Self, Place  Normal Incontinent Weight: 130 lb 11.7 oz (59.3 kg) Height:     BEHAVIORAL SYMPTOMS/MOOD NEUROLOGICAL BOWEL NUTRITION STATUS        Diet (Heart)  AMBULATORY STATUS COMMUNICATION OF NEEDS Skin   Extensive Assist Verbally Other (Comment) (Non pressure wound right arm; Foam Dressing PRN)                       Personal Care Assistance Level of Assistance  Bathing, Feeding, Dressing Bathing Assistance: Limited assistance Feeding assistance: Independent Dressing Assistance: Limited assistance     Functional Limitations Info             SPECIAL CARE FACTORS FREQUENCY  PT (By licensed PT), OT (By licensed OT)     PT Frequency: 5x/week OT Frequency: 5x/week            Contractures Contractures Info: Not present    Additional Factors Info  Code Status, Allergies Code Status Info: DNR Allergies Info: Tramadol,Ibuprofen,Detergents, perfumes and other strong odor emitting compounds           Current Medications (05/31/2017):  This is the current hospital active medication list Current Facility-Administered Medications  Medication Dose Route Frequency Provider Last Rate Last Dose  . 0.9 %  sodium chloride infusion  250 mL Intravenous PRN Ivor Costa, MD      . acetaminophen (TYLENOL) tablet 650 mg  650 mg Oral Q4H PRN Ivor Costa, MD      . amiodarone (PACERONE) tablet 200 mg  200 mg Oral Daily Niu,  Soledad Gerlach, MD   200 mg at 05/31/17 0958  . apixaban (ELIQUIS) tablet 2.5 mg  2.5 mg Oral BID Ivor Costa, MD   2.5 mg at 05/31/17 0958  . ferrous sulfate tablet 325 mg  325 mg Oral Daily Ivor Costa, MD   325 mg at 05/31/17 0370  . furosemide (LASIX) tablet 40 mg  40 mg Oral Daily Domenic Polite, MD   40 mg at 05/31/17 0958  . HYDROcodone-acetaminophen (NORCO/VICODIN) 5-325 MG per tablet 1 tablet  1 tablet Oral Q6H PRN Ivor Costa, MD      .  levothyroxine (SYNTHROID, LEVOTHROID) tablet 75 mcg  75 mcg Oral QAC breakfast Ivor Costa, MD   75 mcg at 05/31/17 [phone]  . loratadine (CLARITIN) tablet 10 mg  10 mg Oral Daily Ivor Costa, MD   10 mg at 05/31/17 0958  . MEDLINE mouth rinse  15 mL Mouth Rinse BID Ivor Costa, MD   15 mL at 05/31/17 1006  . nitroGLYCERIN (NITROSTAT) SL tablet 0.4 mg  0.4 mg Sublingual Q5 min PRN Ivor Costa, MD      . ondansetron [facility]) injection 4 mg  4 mg Intravenous Q6H PRN Ivor Costa, MD      . pantoprazole (PROTONIX) EC tablet 40 mg  40 mg Oral Daily Ivor Costa, MD   40 mg at 05/31/17 0958  . polyethylene glycol (MIRALAX / GLYCOLAX) packet 17 g  17 g Oral Daily Ivor Costa, MD   17 g at 05/30/17 1027  . potassium chloride SA (K-DUR,KLOR-CON) CR tablet 40 mEq  40 mEq Oral BID Domenic Polite, MD   40 mEq at 05/31/17 0958  . senna (SENOKOT) tablet 17.2 mg  2 tablet Oral QHS PRN Ivor Costa, MD      . simvastatin (ZOCOR) tablet 10 mg  10 mg Oral QHS Ivor Costa, MD   10 mg at 05/30/17 2132  . sodium chloride flush (NS) 0.9 % injection 3 mL  3 mL Intravenous Q12H Ivor Costa, MD   3 mL at 05/31/17 1000  . sodium chloride flush (NS) 0.9 % injection 3 mL  3 mL Intravenous PRN Ivor Costa, MD         Discharge Medications: Please see discharge summary for a list of discharge medications.  Relevant Imaging Results:  Relevant Lab Results:   Additional Information SSN [id number]  Burnis Medin, LCSW

## 2017-05-31 NOTE — Progress Notes (Addendum)
Patient voided 334ml; bladder scan showed 499 left in bladder. I/O cath patient - additional 357ml output.

## 2017-05-31 NOTE — Progress Notes (Signed)
Physical Therapy Treatment Patient Details Name: [name] MRN: [id number] DOB: 08/27/1925 Today's Date: 05/31/2017    History of Present Illness :[name] is a 83 y.o. female with medical history significant of hyperlipidemia, GERD, hypothyroidism, thrombocytopenia, pacemaker placement, dCHF, atrial fib, anemia, dementia, CKD-4, who presents with SOB, AMS, frequent fall.    PT Comments    Progressing with mobility. Pt followed 1 step commands well. She also participated well. She demonstrates general weakness, decreased activity tolerance, and impaired gait and balance. She remains at high risk for falls. She is not safe to d/c home at this time. Recommend ST rehab at SNF. Will continue to follow and progress activity as able.     Follow Up Recommendations  SNF     Equipment Recommendations  None recommended by PT    Recommendations for Other Services       Precautions / Restrictions Precautions Precautions: Fall Restrictions Weight Bearing Restrictions: No    Mobility  Bed Mobility Overal bed mobility: Needs Assistance Bed Mobility: Supine to Sit;Sit to Supine     Supine to sit: Min assist;HOB elevated Sit to supine: Min assist;HOB elevated   General bed mobility comments: Assist for trunk and LEs. Increased time. Pt followed commands well.   Transfers Overall transfer level: Needs assistance Equipment used: Rolling walker (2 wheeled) Transfers: Sit to/from [facility] Sit to Stand: Mod assist Stand pivot transfers: Mod assist       General transfer comment: Assist to rise, stabilize, control descent. Multimodal cueing required. Stand pivot, bed to bsc, with RW. Increased time. Very unsteady. High fall risk.   Ambulation/Gait Ambulation/Gait assistance: Mod assist Ambulation Distance (Feet): 5 Feet Assistive device: Rolling walker (2 wheeled) Gait Pattern/deviations: Step-through pattern;Trunk flexed;Decreased stride  length     General Gait Details: Very unsteady. High fall risk. Assist to stabilize pt and maneuver safely with RW.    Stairs            Wheelchair Mobility    Modified Rankin (Stroke Patients Only)       Balance Overall balance assessment: Needs assistance;History of Falls           Standing balance-Leahy Scale: Poor                              Cognition Arousal/Alertness: Awake/alert Behavior During Therapy: WFL for tasks assessed/performed Overall Cognitive Status: History of cognitive impairments - at baseline                                        Exercises      General Comments        Pertinent Vitals/Pain Pain Assessment: Faces Faces Pain Scale: Hurts little more Pain Location: back Pain Descriptors / Indicators: Aching;Sore Pain Intervention(s): Limited activity within patient's tolerance;Repositioned    Home Living                      Prior Function            PT Goals (current goals can now be found in the care plan section) Progress towards PT goals: Progressing toward goals    Frequency    Min 2X/week      PT Plan Current plan remains appropriate    Co-evaluation  AM-PAC PT "6 Clicks" Daily Activity  Outcome Measure  Difficulty turning over in bed (including adjusting bedclothes, sheets and blankets)?: Total Difficulty moving from lying on back to sitting on the side of the bed? : Total Difficulty sitting down on and standing up from a chair with arms (e.g., wheelchair, bedside commode, etc,.)?: Total Help needed moving to and from a bed to chair (including a wheelchair)?: A Lot Help needed walking in hospital room?: A Lot Help needed climbing 3-5 steps with a railing? : A Lot 6 Click Score: 9    End of Session Equipment Utilized During Treatment: Gait belt Activity Tolerance: Patient tolerated treatment well Patient left: in bed;with call bell/phone within  reach;with bed alarm set;with nursing/sitter in room   PT Visit Diagnosis: Muscle weakness (generalized) (M62.81);Difficulty in walking, not elsewhere classified (R26.2)     Time: 2202-5427 PT Time Calculation (min) (ACUTE ONLY): 11 min  Charges:  $Gait Training: 8-22 mins                    G Codes:          Weston Anna, MPT Pager: [phone]

## 2017-05-31 NOTE — Progress Notes (Signed)
Patient voided 364ml; bladder scanner showed 275 ml left in bladder.  Orders to I/O cath if PVR is >350 placed by Dr. Maylene Roes.  Will continue to monitor patient.

## 2017-06-01 DIAGNOSIS — R338 Other retention of urine: Secondary | ICD-10-CM

## 2017-06-01 NOTE — Progress Notes (Signed)
Report called to Maudie Mercury at [facility]. Waiting for EMS to pick up pt. SRP, RN

## 2017-06-01 NOTE — Clinical Social Work Placement (Signed)
   CLINICAL SOCIAL WORK PLACEMENT  NOTE  Date:  06/01/2017  Patient Details  Name: [name] MRN: [id number] Date of Birth: 1924-12-03  Clinical Social Work is seeking post-discharge placement for this patient at the [place] level of care (*CSW will initial, date and re-position this form in  chart as items are completed):  Yes   Patient/family provided with [facility] Work Department's list of facilities offering this level of care within the geographic area requested by the patient (or if unable, by the patient's family).  Yes   Patient/family informed of their freedom to choose among providers that offer the needed level of care, that participate in Medicare, Medicaid or managed care program needed by the patient, have an available bed and are willing to accept the patient.  Yes   Patient/family informed of [facility] [place]'s ownership interest in [facility] and [facility], as well as of the fact that they are under no obligation to receive care at these facilities.  PASRR submitted to EDS on 06/01/17     PASRR number received on 06/01/17     Existing PASRR number confirmed on       FL2 transmitted to all facilities in geographic area requested by pt/family on 06/01/17     FL2 transmitted to all facilities within larger geographic area on       Patient informed that his/her managed care company has contracts with or will negotiate with certain facilities, including the following:        Yes   Patient/family informed of bed offers received.  Patient chooses bed at [facility]     Physician recommends and patient chooses bed at [facility]    Patient to be transferred to [facility] on 06/01/17.  Patient to be transferred to facility by EMS     Patient family notified on 06/01/17 of transfer.  Name of family member notified:  Patient, alerting family. None at bedside     PHYSICIAN Please sign FL2, Please sign DNR      Additional Comment:    _______________________________________________ Lilly [place], LCSW 06/01/2017, 9:32 AM

## 2017-06-01 NOTE — Progress Notes (Addendum)
LCSW following for disposition of needs. Plan is for discharge today to SNF.  Admission will need to be later this afternoon as bed is being cleaned for patient.  LCSW has spoken with Cyril Mourning at [facility] and left message for [facility] regarding insurance authorization.  Patient will be going to [place] at [facility]:  [phone]  All documentation has been sent through [facility].  Awaiting call from facility with regards to time patient can admit.  Completed:  Patient can admit after 3pm.    Will update RN and patient once calls returned. Completed: RN Jillyn Ledger was contacted and made aware of plans.   Patient planning to DC by EMS per report, but LCSW will confirm. LCSW discussed plans with daughter:  [name]  [phone] and updated on discharge plan and transport. Daughter confirms plan, EMS is requested and daughter will follow up at the facility.  Lane Hacker, MSW Clinical Social Work: [employer] Coverage for :  [phone]

## 2017-06-01 NOTE — Progress Notes (Signed)
  PROGRESS NOTE  Patient seen and examined. No complaints of shortness of breath, minimal cough, no chest pain, abdominal pain, nausea, vomiting. Stable for discharge to SNF when she has been sitter free for > 24 hours, which should be this afternoon. Patient in agreement for rehab.   Dessa Phi, DO [facility] [email or web address] Password [email or web address] 06/01/2017, 9:12 AM

## 2017-06-01 NOTE — Progress Notes (Signed)
Patient voided 100 ml; bladder scan showed 268. I/O cath not done at this time d/t order being to I/O cath if bladder scan showed 350 ml or more.

## 2017-06-09 ENCOUNTER — Emergency Department (HOSPITAL_COMMUNITY)
Admission: EM | Admit: 2017-06-09 | Discharge: 2017-06-09 | Disposition: A | Discharge status: Home / Self Care | Payer: Medicare Other | Attending: Emergency Medicine | Admitting: Emergency Medicine

## 2017-06-09 ENCOUNTER — Encounter: Payer: Self-pay | Admitting: Internal Medicine

## 2017-06-09 ENCOUNTER — Emergency Department (HOSPITAL_COMMUNITY): Payer: Medicare Other

## 2017-06-09 DIAGNOSIS — Z79899 Other long term (current) drug therapy: Secondary | ICD-10-CM | POA: Insufficient documentation

## 2017-06-09 DIAGNOSIS — I130 Hypertensive heart and chronic kidney disease with heart failure and stage 1 through stage 4 chronic kidney disease, or unspecified chronic kidney disease: Secondary | ICD-10-CM | POA: Diagnosis not present

## 2017-06-09 DIAGNOSIS — R079 Chest pain, unspecified: Secondary | ICD-10-CM | POA: Insufficient documentation

## 2017-06-09 DIAGNOSIS — Z85828 Personal history of other malignant neoplasm of skin: Secondary | ICD-10-CM | POA: Insufficient documentation

## 2017-06-09 DIAGNOSIS — N184 Chronic kidney disease, stage 4 (severe): Secondary | ICD-10-CM | POA: Insufficient documentation

## 2017-06-09 DIAGNOSIS — Z7901 Long term (current) use of anticoagulants: Secondary | ICD-10-CM | POA: Insufficient documentation

## 2017-06-09 DIAGNOSIS — I5033 Acute on chronic diastolic (congestive) heart failure: Secondary | ICD-10-CM | POA: Diagnosis not present

## 2017-06-09 DIAGNOSIS — Z950 Presence of cardiac pacemaker: Secondary | ICD-10-CM | POA: Insufficient documentation

## 2017-06-09 DIAGNOSIS — E039 Hypothyroidism, unspecified: Secondary | ICD-10-CM | POA: Diagnosis not present

## 2017-06-09 LAB — HEPATIC FUNCTION PANEL
ALK PHOS: 74 U/L (ref 38–126)
ALT: 16 U/L (ref 14–54)
AST: 21 U/L (ref 15–41)
Albumin: 3 g/dL — ABNORMAL LOW (ref 3.5–5.0)
BILIRUBIN DIRECT: 0.2 mg/dL (ref 0.1–0.5)
BILIRUBIN TOTAL: 0.7 mg/dL (ref 0.3–1.2)
Indirect Bilirubin: 0.5 mg/dL (ref 0.3–0.9)
Total Protein: 6.2 g/dL — ABNORMAL LOW (ref 6.5–8.1)

## 2017-06-09 LAB — CBC
HCT: 43.4 % (ref 36.0–46.0)
HEMOGLOBIN: 14.1 g/dL (ref 12.0–15.0)
MCH: 30.7 pg (ref 26.0–34.0)
MCHC: 32.5 g/dL (ref 30.0–36.0)
MCV: 94.3 fL (ref 78.0–100.0)
Platelets: 138 10*3/uL — ABNORMAL LOW (ref 150–400)
RBC: 4.6 MIL/uL (ref 3.87–5.11)
RDW: 14.1 % (ref 11.5–15.5)
WBC: 6.1 10*3/uL (ref 4.0–10.5)

## 2017-06-09 LAB — LIPASE, BLOOD: LIPASE: 39 U/L (ref 11–51)

## 2017-06-09 LAB — BASIC METABOLIC PANEL
ANION GAP: 9 (ref 5–15)
BUN: 35 mg/dL — ABNORMAL HIGH (ref 6–20)
CALCIUM: 9.6 mg/dL (ref 8.9–10.3)
CO2: 24 mmol/L (ref 22–32)
Chloride: 104 mmol/L (ref 101–111)
Creatinine, Ser: 2.24 mg/dL — ABNORMAL HIGH (ref 0.44–1.00)
GFR, EST AFRICAN AMERICAN: 21 mL/min — AB (ref >60)
GFR, EST NON AFRICAN AMERICAN: 18 mL/min — AB (ref >60)
Glucose, Bld: 108 mg/dL — ABNORMAL HIGH (ref 65–99)
Potassium: 3.6 mmol/L (ref 3.5–5.1)
Sodium: 137 mmol/L (ref 135–145)

## 2017-06-09 LAB — I-STAT TROPONIN, ED
TROPONIN I, POC: 0 ng/mL (ref 0.00–0.08)
TROPONIN I, POC: 0.03 ng/mL (ref 0.00–0.08)

## 2017-06-09 MED ORDER — POTASSIUM CHLORIDE CRYS ER 20 MEQ PO TBCR
20.0000 meq | EXTENDED_RELEASE_TABLET | Freq: Every day | ORAL | 2 refills | Status: AC
Start: 2017-06-09 — End: ?

## 2017-06-09 MED ORDER — TORSEMIDE 20 MG PO TABS: 20.0000 mg | ORAL_TABLET | Freq: Every day | ORAL | 6 refills | Status: AC

## 2017-06-09 NOTE — Discharge Instructions (Addendum)
Decrease torsemide to 20 mg per day.  Decrease potassium to 20 meq per day.  Return if worse at any time. Follow up with repeat bmet and in chf clinic next week.

## 2017-06-09 NOTE — ED Provider Notes (Signed)
[facility] DEPT Provider Note   CSN: [id number] Arrival date & time: 06/09/17  9937     History   Chief Complaint Chief Complaint  Patient presents with  . Chest Pain    HPI [name] is a 83 y.o. female. Level V caveat secondary to dementia. HPI  83 year old female history of anemia, atrial fibrillation, chest pain, dementia presents today complaining of chest pain last night. She describes being awake during the night and then noting some pain in the epigastrium to the right anterior chest. She denies any dyspnea. She thinks it occurred approximately 20 minutes. She is unable tell me if it was similar to any previous symptoms or not. She denies any current pain, dyspnea, fever, chills, or cough.  Past Medical History:  Diagnosis Date  . Anemia   . Atrial fibrillation ([place])    a. Dx 01/2014->Eliquis started.  . Cancer ([place])   . Chest pain    a. 2011 Neg MV;  b. 01/2014 Cath: LM 20-30, LAD nl, D1 nl, LCX nl, OM1 nl, RCA dom 30-50m, PD/PL nl, EF 65%->Med Rx; c. 03/2016 MV: no ischemia/infarct. EF 63%.  . Degenerative joint disease   . Diastolic CHF, acute on chronic ([place]) 01/31/2012  . Dyslipidemia   . Dysrhythmia   . Family history of adverse reaction to anesthesia    " multiple family members have difficulty waking "  . GERD (gastroesophageal reflux disease)   . History of skin cancer    tafeen/ non melanoma.   Marland Kitchen Hx of varicella   . Hypertension   . Hypothyroidism   . Monoclonal gammopathies   . Pacemaker   . Palpitations    PVCs/bigeminy on event in May 2009 revealing this was relatively asymptomatic  . Right lower quadrant abdominal pain 07/19/2012   Recurrent  With nausea    This is new since she had ct for llq pain in [facility]    R/o appendiceal problem  Hernia  Get ct scan  And plan  Fu     . Thrombocytopenia [facility])     Patient Active Problem List   Diagnosis Date Noted  . Acute urinary retention 06/01/2017  . Acute on chronic diastolic (congestive)  heart failure ([place]) 05/27/2017  . Fall 05/27/2017  . Acute metabolic encephalopathy 16/96/7893  . Mitral regurgitation 03/10/2017  . Mitral stenosis 03/10/2017  . Chest pain 03/08/2017  . Dehydration 11/18/2016  . Intractable nausea and vomiting 11/18/2016  . Generalized weakness 11/18/2016  . Acute kidney injury superimposed on chronic kidney disease ([place]) 11/11/2016  . Hypokalemia 11/11/2016  . Hypertensive heart disease 04/01/2016  . PAF (paroxysmal atrial fibrillation) ([place]) 03/31/2016  . Chest pain with moderate risk for cardiac etiology, negative MI and Nuc study, + muscular skeletal   . Atypical chest pain 01/21/2016  . CKD (chronic kidney disease) stage 4, GFR 15-29 ml/min (H[facility]) 05/09/2015  . Anemia of chronic disease 05/09/2015  . Dyslipidemia 05/09/2015  . Elevated troponin   . Iron deficiency anemia 11/21/2014  . Pacemaker 04/27/2014  . Chronic anticoagulation 02/25/2014  . Chronic diastolic CHF (congestive heart failure) ([place]) 02/24/2014  . Atrial fibrillation ([place]) 01/29/2014  . MGUS (monoclonal gammopathy of unknown significance) 08/01/2013  . Thrombocytopenia ([place])   . Hypothyroidism 02/19/2012  . Hyperlipidemia 12/15/2008  . HYPERTENSION, BENIGN 12/15/2008    Past Surgical History:  Procedure Laterality Date  . CARDIOVERSION N/A 02/26/2014   Procedure: CARDIOVERSION AT BEDSIDE;  Surgeon: Pixie Casino, MD;  Location: [facility];  Service: Cardiovascular;  Laterality: N/A;  . CARDIOVERSION N/A 04/22/2014   Procedure: CARDIOVERSION (BEDSIDE);  Surgeon: [name], MD;  Location: [facility] OR;  Service: Cardiovascular;  Laterality: N/A;  . CATARACT EXTRACTION     Bilateral  implantt  . CESAREAN SECTION     times 2  . CHOLECYSTECTOMY  1999  . ESOPHAGOGASTRODUODENOSCOPY N/A 07/06/2014   Procedure: ESOPHAGOGASTRODUODENOSCOPY (EGD);  Surgeon: Ladene Artist, MD;  Location: [facility] ENDOSCOPY;  Service: Endoscopy;  Laterality: N/A;  . INSERT / REPLACE / REMOVE PACEMAKER    .  LEFT HEART CATHETERIZATION WITH CORONARY ANGIOGRAM N/A 01/29/2014   Procedure: LEFT HEART CATHETERIZATION WITH CORONARY ANGIOGRAM;  Surgeon: Blane Ohara, MD;  Location: [facility] CATH LAB;  Service: Cardiovascular;  Laterality: N/A;  . PACEMAKER INSERTION  04/23/2014   STJ Assurity dual chamber pacemaker implanted by Dr Rayann Heman  . PERMANENT PACEMAKER INSERTION N/A 04/23/2014   Procedure: PERMANENT PACEMAKER INSERTION;  Surgeon: Evans Lance, MD;  Location: [facility] CATH LAB;  Service: Cardiovascular;  Laterality: N/A;  . RIGHT HEART CATHETERIZATION N/A 09/22/2014   Procedure: RIGHT HEART CATH;  Surgeon: Jolaine Artist, MD;  Location: [facility] CATH LAB;  Service: Cardiovascular;  Laterality: N/A;  . SHOULDER SURGERY  1996   Right     OB History    No data available       Home Medications    Prior to Admission medications   Medication Sig Start Date End Date Taking? Authorizing Provider  amiodarone (PACERONE) 200 MG tablet Take 1 tablet (200 mg total) by mouth daily. 11/25/16   Bensimhon, Shaune Pascal, MD  baclofen (LIORESAL) 10 MG tablet Take 10 mg by mouth 2 (two) times daily as needed for muscle spasms.    [provider]  denosumab (PROLIA) 60 MG/ML SOLN injection Inject 60 mg into the skin every 6 (six) months. Administer in upper arm, thigh, or abdomen    [provider]  ELIQUIS 2.5 MG TABS tablet TAKE ONE TABLET BY MOUTH TWICE DAILY 05/22/17   Bensimhon, Shaune Pascal, MD  Ferrous Sulfate (IRON) 325 (65 Fe) MG TABS Take 1 tablet by mouth daily. 07/01/16   Johnson, Clanford L, MD  levothyroxine (SYNTHROID, LEVOTHROID) 75 MCG tablet TAKE 1 TABLET BY MOUTH ONCE DAILY 05/22/17   Panosh, Standley Brooking, MD  loratadine (CLARITIN) 10 MG tablet Take 1 tablet (10 mg total) by mouth daily. Patient taking differently: Take 10 mg by mouth daily as needed for allergies or rhinitis.  03/10/17   Jani Gravel, MD  meclizine (ANTIVERT) 12.5 MG tablet Take 1 tablet (12.5 mg total) by mouth 2 (two) times daily as  needed for dizziness. 09/14/16   Bensimhon, Shaune Pascal, MD  metolazone (ZAROXOLYN) 2.5 MG tablet Take 1 tablet (2.5 mg total) by mouth as needed (for weight 134 lb or greater). 03/09/16   Bensimhon, Shaune Pascal, MD  nitroGLYCERIN (NITROSTAT) 0.4 MG SL tablet Place 1 tablet (0.4 mg total) under the tongue every 5 (five) minutes as needed. For chest pain 05/06/16   Bensimhon, Shaune Pascal, MD  pantoprazole (PROTONIX) 40 MG tablet Take 40 mg by mouth daily.    [provider]  polyethylene glycol (MIRALAX) packet Take 17 g by mouth daily. Mix with 8 ounces of liquid. Patient taking differently: Take 17 g by mouth daily as needed for mild constipation. Mix with 8 ounces of liquid. 03/11/17   Quintella Reichert, MD  potassium chloride SA (K-DUR,KLOR-CON) 20 MEQ tablet Take 2 tablets (40 mEq total) by mouth daily. Take an  additional 92meq with Metolazone. 10/04/16   Bensimhon, Shaune Pascal, MD  senna (SENOKOT) 8.6 MG TABS tablet Take 2 tablets (17.2 mg total) by mouth at bedtime. Patient taking differently: Take 2 tablets by mouth at bedtime as needed for mild constipation.  11/13/16   Patrecia Pour, Christean Grief, MD  simvastatin (ZOCOR) 10 MG tablet TAKE ONE TABLET BY MOUTH AT BEDTIME 02/14/17   Larey Dresser, MD  torsemide (DEMADEX) 20 MG tablet Take 2 tablets (40 mg total) by mouth daily. Please start on 11/22/2016 11/22/16   Elgergawy, Silver Huguenin, MD    Family History Family History  Problem Relation Age of Onset  . Coronary artery disease Father   . Heart disease Father   . Heart attack Father   . Alzheimer's disease Mother   . Lung cancer Brother 85  . Lung cancer Unknown     Social History Social History  Substance Use Topics  . Smoking status: Never Smoker  . Smokeless tobacco: Never Used  . Alcohol use No     Allergies   Other; Tramadol; and Ibuprofen   Review of Systems Review of Systems  All other systems reviewed and are negative.    Physical Exam Updated Vital Signs BP 110/72   Pulse 75    Temp (!) 97.3 F (36.3 C) (Oral)   Resp 14   Ht 1.473 m (4\' 10" )   Wt 54.9 kg (121 lb)   SpO2 97%   BMI 25.29 kg/m   Physical Exam  Constitutional: She is oriented to person, place, and time. She appears well-developed and well-nourished. No distress.  HENT:  Head: Normocephalic and atraumatic.  Right Ear: External ear normal.  Left Ear: External ear normal.  Nose: Nose normal.  Eyes: Pupils are equal, round, and reactive to light. Conjunctivae and EOM are normal.  Neck: Normal range of motion. Neck supple.  Cardiovascular: Normal rate, regular rhythm and normal heart sounds.   Pulmonary/Chest: Effort normal.  Abdominal: Soft. Bowel sounds are normal.  Musculoskeletal: Normal range of motion.  Neurological: She is alert and oriented to person, place, and time. She exhibits normal muscle tone. Coordination normal.  Skin: Skin is warm and dry.  Psychiatric: She has a normal mood and affect. Her behavior is normal. Thought content normal.  Nursing note and vitals reviewed.    ED Treatments / Results  Labs (all labs ordered are listed, but only abnormal results are displayed) Labs Reviewed  CBC - Abnormal; Notable for the following:       Result Value   Platelets 138 (*)    All other components within normal limits  BASIC METABOLIC PANEL  HEPATIC FUNCTION PANEL  LIPASE, BLOOD  I-STAT TROPONIN, ED    EKG  EKG Interpretation  Date/Time:  Friday June 09 2017 06:26:53 EDT Ventricular Rate:  73 PR Interval:    QRS Duration: 159 QT Interval:  485 QTC Calculation: 535 R Axis:   111 Text Interpretation:  Afib/flutter and ventricular-paced rhythm No further analysis attempted due to paced rhythm Confirmed by Pattricia Boss [phone]) on 06/09/2017 7:29:10 AM       Radiology Dg Chest 2 View  Result Date: 06/09/2017 CLINICAL DATA:  Chest pain this morning.  Nausea and weakness. EXAM: CHEST  2 VIEW COMPARISON:  05/27/2017 FINDINGS: Grossly intact dual-lumen transvenous  cardiac leads. Normal heart size. The lungs are clear except for mild chronic interstitial coarsening. Hilar and mediastinal contours are unremarkable and unchanged. No pleural effusions. IMPRESSION: Chronic interstitial coarsening.  No consolidation  or effusion. Electronically Signed   By: Andreas Newport M.D.   On: 06/09/2017 06:55    Procedures Procedures (including critical care time)  Medications Ordered in ED Medications - No data to display   Initial Impression / Assessment and Plan / ED Course  I have reviewed the triage vital signs and the nursing notes.  Pertinent labs & imaging results that were available during my care of the patient were reviewed by me and considered in my medical decision making (see chart for details).    83 year old female with dementia presents today with complaints that she had chest pain during the night. EKG is paced rhythm. Initial troponin is normal. Plan repeat troponin and will discharge if continues normal. Pacemaker has been interrogated and awaiting these results.  1 chest pain patient with pacemaker rhythm in place. Troponin is normal. Ulcer troponin is pending. Plan consult cardiology regarding whether or not she needs further evaluation- will d/c after d.w. Cardiology if repeat trop normal 2 elevated creatinine patient's creatinine is elevated 2.4. Her baseline has been 1.62. She was discharged from the hospital 2 days ago and at that time was to resume her home Demadex and when necessary Zaroxolyn. Patient could have overdiuresis she has no signs of congestive heart failure today.Plan decrease demadex and potassium by half (to 20 mg and 20 meq)  Final Clinical Impressions(s) / ED Diagnoses   Final diagnoses:  Chest pain, unspecified type    New Prescriptions New Prescriptions   No medications on file     Pattricia Boss, MD 06/09/17 1529

## 2017-06-09 NOTE — ED Triage Notes (Signed)
Pt BIB EMS from [facility]. Approx 0230 began having chest tightness mid-sternal under L breast to her back. Given 3x nitro and 324mg  ASA en route. Denies additional s/sx. V/S WNL.

## 2017-07-19 ENCOUNTER — Emergency Department (HOSPITAL_COMMUNITY): Payer: Medicare Other

## 2017-07-19 ENCOUNTER — Emergency Department (HOSPITAL_COMMUNITY)
Admission: EM | Admit: 2017-07-19 | Discharge: 2017-07-19 | Disposition: A | Discharge status: Home / Self Care | Payer: Medicare Other | Attending: Emergency Medicine | Admitting: Emergency Medicine

## 2017-07-19 ENCOUNTER — Encounter (HOSPITAL_COMMUNITY): Payer: Self-pay | Admitting: Emergency Medicine

## 2017-07-19 DIAGNOSIS — Z7901 Long term (current) use of anticoagulants: Secondary | ICD-10-CM | POA: Insufficient documentation

## 2017-07-19 DIAGNOSIS — E039 Hypothyroidism, unspecified: Secondary | ICD-10-CM | POA: Diagnosis not present

## 2017-07-19 DIAGNOSIS — I5033 Acute on chronic diastolic (congestive) heart failure: Secondary | ICD-10-CM | POA: Diagnosis not present

## 2017-07-19 DIAGNOSIS — R079 Chest pain, unspecified: Secondary | ICD-10-CM | POA: Diagnosis not present

## 2017-07-19 DIAGNOSIS — N390 Urinary tract infection, site not specified: Secondary | ICD-10-CM

## 2017-07-19 DIAGNOSIS — I480 Paroxysmal atrial fibrillation: Secondary | ICD-10-CM | POA: Insufficient documentation

## 2017-07-19 DIAGNOSIS — N184 Chronic kidney disease, stage 4 (severe): Secondary | ICD-10-CM | POA: Insufficient documentation

## 2017-07-19 DIAGNOSIS — I110 Hypertensive heart disease with heart failure: Secondary | ICD-10-CM | POA: Diagnosis not present

## 2017-07-19 DIAGNOSIS — Z950 Presence of cardiac pacemaker: Secondary | ICD-10-CM | POA: Insufficient documentation

## 2017-07-19 DIAGNOSIS — I130 Hypertensive heart and chronic kidney disease with heart failure and stage 1 through stage 4 chronic kidney disease, or unspecified chronic kidney disease: Secondary | ICD-10-CM | POA: Insufficient documentation

## 2017-07-19 DIAGNOSIS — Z79899 Other long term (current) drug therapy: Secondary | ICD-10-CM | POA: Insufficient documentation

## 2017-07-19 DIAGNOSIS — E785 Hyperlipidemia, unspecified: Secondary | ICD-10-CM | POA: Insufficient documentation

## 2017-07-19 LAB — URINALYSIS, ROUTINE W REFLEX MICROSCOPIC
Bilirubin Urine: NEGATIVE
GLUCOSE, UA: NEGATIVE mg/dL
Hgb urine dipstick: NEGATIVE
Ketones, ur: NEGATIVE mg/dL
NITRITE: NEGATIVE
PH: 5 (ref 5.0–8.0)
Protein, ur: NEGATIVE mg/dL
Specific Gravity, Urine: 1.013 (ref 1.005–1.030)
Squamous Epithelial / LPF: NONE SEEN

## 2017-07-19 LAB — BASIC METABOLIC PANEL
ANION GAP: 8 (ref 5–15)
BUN: 24 mg/dL — ABNORMAL HIGH (ref 6–20)
CALCIUM: 9.1 mg/dL (ref 8.9–10.3)
CO2: 27 mmol/L (ref 22–32)
Chloride: 105 mmol/L (ref 101–111)
Creatinine, Ser: 1.76 mg/dL — ABNORMAL HIGH (ref 0.44–1.00)
GFR, EST AFRICAN AMERICAN: 28 mL/min — AB (ref >60)
GFR, EST NON AFRICAN AMERICAN: 24 mL/min — AB (ref >60)
GLUCOSE: 103 mg/dL — AB (ref 65–99)
POTASSIUM: 3.6 mmol/L (ref 3.5–5.1)
SODIUM: 140 mmol/L (ref 135–145)

## 2017-07-19 LAB — I-STAT TROPONIN, ED
TROPONIN I, POC: 0 ng/mL (ref 0.00–0.08)
Troponin i, poc: 0.03 ng/mL (ref 0.00–0.08)

## 2017-07-19 LAB — CBC
HEMATOCRIT: 39.1 % (ref 36.0–46.0)
HEMOGLOBIN: 12.4 g/dL (ref 12.0–15.0)
MCH: 30.8 pg (ref 26.0–34.0)
MCHC: 31.7 g/dL (ref 30.0–36.0)
MCV: 97.3 fL (ref 78.0–100.0)
Platelets: 141 10*3/uL — ABNORMAL LOW (ref 150–400)
RBC: 4.02 MIL/uL (ref 3.87–5.11)
RDW: 15 % (ref 11.5–15.5)
WBC: 7.3 10*3/uL (ref 4.0–10.5)

## 2017-07-19 MED ORDER — CEPHALEXIN 500 MG PO CAPS: 500.0000 mg | ORAL_CAPSULE | Freq: Three times a day (TID) | ORAL | 0 refills | Status: DC

## 2017-07-19 MED ORDER — CEPHALEXIN 250 MG PO CAPS
500.0000 mg | ORAL_CAPSULE | Freq: Once | ORAL | Status: AC
  Administered 2017-07-19: 500 mg via ORAL
  Filled 2017-07-19: qty 2

## 2017-07-19 NOTE — ED Provider Notes (Signed)
[facility] DEPT Provider Note   CSN: [id number] Arrival date & time: 07/19/17  0128     History   Chief Complaint Chief Complaint  Patient presents with  . Chest Pain    HPI [name] is a 83 y.o. female.  HPI    83 yo female with a history of atrial fibrillation, hypertension, hyperlipidemia, monoclonalal gammopathy, presents with concern for chest pain. Reports after dinner around 6PM began to develop chest pain.  Had left sided chest tingling, right sided chest "thumping" radiating to the right shoulder and back. No associated dyspnea, no nausea, no diaphoresis. No abdominal pain.  Received nitro with improvement. Reports [name] used to take nitro for CP with improvement but did not have any to take.  Denies exertional symptoms.  No chest pain at this time.   Also reports days of dysuria and frequency. Past Medical History:  Diagnosis Date  . Anemia   . Atrial fibrillation ([place])    a. Dx 01/2014->Eliquis started.  . Cancer ([place])   . Chest pain    a. 2011 Neg MV;  b. 01/2014 Cath: LM 20-30, LAD nl, D1 nl, LCX nl, OM1 nl, RCA dom 30-110m, PD/PL nl, EF 65%->Med Rx; c. 03/2016 MV: no ischemia/infarct. EF 63%.  . Degenerative joint disease   . Diastolic CHF, acute on chronic ([place]) 01/31/2012  . Dyslipidemia   . Dysrhythmia   . Family history of adverse reaction to anesthesia    " multiple family members have difficulty waking "  . GERD (gastroesophageal reflux disease)   . History of skin cancer    tafeen/ non melanoma.   Marland Kitchen Hx of varicella   . Hypertension   . Hypothyroidism   . Monoclonal gammopathies   . Pacemaker   . Palpitations    PVCs/bigeminy on event in May 2009 revealing this was relatively asymptomatic  . Right lower quadrant abdominal pain 07/19/2012   Recurrent  With nausea    This is new since [name] had ct for llq pain in [facility]    R/o appendiceal problem  Hernia  Get ct scan  And plan  Fu     . Thrombocytopenia [facility])     Patient Active Problem List   Diagnosis Date Noted  . Acute urinary retention 06/01/2017  . Acute on chronic diastolic (congestive) heart failure ([place]) 05/27/2017  . Fall 05/27/2017  . Acute metabolic encephalopathy 35/36/1443  . Mitral regurgitation 03/10/2017  . Mitral stenosis 03/10/2017  . Chest pain 03/08/2017  . Dehydration 11/18/2016  . Intractable nausea and vomiting 11/18/2016  . Generalized weakness 11/18/2016  . Acute kidney injury superimposed on chronic kidney disease ([place]) 11/11/2016  . Hypokalemia 11/11/2016  . Hypertensive heart disease 04/01/2016  . PAF (paroxysmal atrial fibrillation) ([place]) 03/31/2016  . Chest pain with moderate risk for cardiac etiology, negative MI and Nuc study, + muscular skeletal   . Atypical chest pain 01/21/2016  . CKD (chronic kidney disease) stage 4, GFR 15-29 ml/min (H[facility]) 05/09/2015  . Anemia of chronic disease 05/09/2015  . Dyslipidemia 05/09/2015  . Elevated troponin   . Iron deficiency anemia 11/21/2014  . Pacemaker 04/27/2014  . Chronic anticoagulation 02/25/2014  . Chronic diastolic CHF (congestive heart failure) ([place]) 02/24/2014  . Atrial fibrillation ([place]) 01/29/2014  . MGUS (monoclonal gammopathy of unknown significance) 08/01/2013  . Thrombocytopenia ([place])   . Hypothyroidism 02/19/2012  . Hyperlipidemia 12/15/2008  . HYPERTENSION, BENIGN 12/15/2008    Past Surgical History:  Procedure Laterality Date  . CARDIOVERSION N/A 02/26/2014  Procedure: CARDIOVERSION AT BEDSIDE;  Surgeon: Pixie Casino, MD;  Location: [facility];  Service: Cardiovascular;  Laterality: N/A;  . CARDIOVERSION N/A 04/22/2014   Procedure: CARDIOVERSION (BEDSIDE);  Surgeon: [name], MD;  Location: [facility] OR;  Service: Cardiovascular;  Laterality: N/A;  . CATARACT EXTRACTION     Bilateral  implantt  . CESAREAN SECTION     times 2  . CHOLECYSTECTOMY  1999  . ESOPHAGOGASTRODUODENOSCOPY N/A 07/06/2014   Procedure: ESOPHAGOGASTRODUODENOSCOPY (EGD);  Surgeon: Ladene Artist, MD;   Location[facility] ENDOSCOPY;  Service: Endoscopy;  Laterality: N/A;  . INSERT / REPLACE / REMOVE PACEMAKER    . LEFT HEART CATHETERIZATION WITH CORONARY ANGIOGRAM N/A 01/29/2014   Procedure: LEFT HEART CATHETERIZATION WITH CORONARY ANGIOGRAM;  Surgeon: Blane Ohara, MD;  Location: [facility] CATH LAB;  Service: Cardiovascular;  Laterality: N/A;  . PACEMAKER INSERTION  04/23/2014   STJ Assurity dual chamber pacemaker implanted by Dr Rayann Heman  . PERMANENT PACEMAKER INSERTION N/A 04/23/2014   Procedure: PERMANENT PACEMAKER INSERTION;  Surgeon: Evans Lance, MD;  Location: [facility] CATH LAB;  Service: Cardiovascular;  Laterality: N/A;  . RIGHT HEART CATHETERIZATION N/A 09/22/2014   Procedure: RIGHT HEART CATH;  Surgeon: Jolaine Artist, MD;  Location: [facility] CATH LAB;  Service: Cardiovascular;  Laterality: N/A;  . SHOULDER SURGERY  1996   Right     OB History    No data available       Home Medications    Prior to Admission medications   Medication Sig Start Date End Date Taking? Authorizing Provider  acetaminophen (TYLENOL) 325 MG tablet Take 650 mg by mouth every 4 (four) hours as needed for moderate pain.    [provider]  amiodarone (PACERONE) 200 MG tablet Take 1 tablet (200 mg total) by mouth daily. 11/25/16   Bensimhon, Shaune Pascal, MD  baclofen (LIORESAL) 10 MG tablet Take 10 mg by mouth 2 (two) times daily as needed for muscle spasms.    [provider]  cephALEXin (KEFLEX) 500 MG capsule Take 1 capsule (500 mg total) by mouth 3 (three) times daily. 07/19/17 07/26/17  Gareth Morgan, MD  citalopram (CELEXA) 10 MG tablet Take 10 mg by mouth daily.    [provider]  denosumab (PROLIA) 60 MG/ML SOLN injection Inject 60 mg into the skin every 6 (six) months. Administer in upper arm, thigh, or abdomen    [provider]  ELIQUIS 2.5 MG TABS tablet TAKE ONE TABLET BY MOUTH TWICE DAILY 05/22/17   Bensimhon, Shaune Pascal, MD  Ferrous Sulfate (IRON) 325 (65 Fe) MG TABS Take 1  tablet by mouth daily. 07/01/16   Johnson, Clanford L, MD  levothyroxine (SYNTHROID, LEVOTHROID) 75 MCG tablet TAKE 1 TABLET BY MOUTH ONCE DAILY 05/22/17   Panosh, Standley Brooking, MD  loratadine (CLARITIN) 10 MG tablet Take 1 tablet (10 mg total) by mouth daily. Patient taking differently: Take 10 mg by mouth daily as needed for allergies or rhinitis.  03/10/17   Jani Gravel, MD  meclizine (ANTIVERT) 12.5 MG tablet Take 1 tablet (12.5 mg total) by mouth 2 (two) times daily as needed for dizziness. 09/14/16   Bensimhon, Shaune Pascal, MD  metolazone (ZAROXOLYN) 2.5 MG tablet Take 1 tablet (2.5 mg total) by mouth as needed (for weight 134 lb or greater). 03/09/16   Bensimhon, Shaune Pascal, MD  nitroGLYCERIN (NITROSTAT) 0.4 MG SL tablet Place 1 tablet (0.4 mg total) under the tongue every 5 (five) minutes as needed. For chest pain 05/06/16  Bensimhon, Shaune Pascal, MD  pantoprazole (PROTONIX) 40 MG tablet Take 40 mg by mouth daily.    [provider]  polyethylene glycol (MIRALAX) packet Take 17 g by mouth daily. Mix with 8 ounces of liquid. Patient taking differently: Take 17 g by mouth daily as needed for mild constipation. Mix with 8 ounces of liquid. 03/11/17   Quintella Reichert, MD  potassium chloride SA (K-DUR,KLOR-CON) 20 MEQ tablet Take 1 tablet (20 mEq total) by mouth daily. Take an additional 72meq with Metolazone. 06/09/17   Pattricia Boss, MD  senna (SENOKOT) 8.6 MG TABS tablet Take 2 tablets (17.2 mg total) by mouth at bedtime. Patient taking differently: Take 2 tablets by mouth at bedtime as needed for mild constipation.  11/13/16   Patrecia Pour, Christean Grief, MD  simvastatin (ZOCOR) 10 MG tablet TAKE ONE TABLET BY MOUTH AT BEDTIME 02/14/17   Larey Dresser, MD  torsemide (DEMADEX) 20 MG tablet Take 1 tablet (20 mg total) by mouth daily. Please start on 11/22/2016 06/09/17   Pattricia Boss, MD    Family History Family History  Problem Relation Age of Onset  . Coronary artery disease Father   . Heart disease Father     . Heart attack Father   . Alzheimer's disease Mother   . Lung cancer Brother 59  . Lung cancer Unknown     Social History Social History  Substance Use Topics  . Smoking status: Never Smoker  . Smokeless tobacco: Never Used  . Alcohol use No     Allergies   Other; Tramadol; and Ibuprofen   Review of Systems Review of Systems  Constitutional: Negative for fever.  HENT: Negative for sore throat.   Eyes: Negative for visual disturbance.  Respiratory: Negative for cough and shortness of breath.   Cardiovascular: Positive for chest pain.  Gastrointestinal: Negative for abdominal pain.  Genitourinary: Positive for dysuria and frequency. Negative for difficulty urinating.  Musculoskeletal: Negative for back pain and neck pain.  Skin: Negative for rash.  Neurological: Negative for syncope and headaches.     Physical Exam Updated Vital Signs BP 119/64   Pulse 74   Temp 98.9 F (37.2 C) (Oral)   Resp (!) 23   SpO2 99%   Physical Exam  Constitutional: [name] is oriented to person, place, and time. [name] appears well-developed and well-nourished. No distress.  HENT:  Head: Normocephalic and atraumatic.  Eyes: Conjunctivae and EOM are normal.  Neck: Normal range of motion.  Cardiovascular: Normal rate, regular rhythm, normal heart sounds and intact distal pulses.  Exam reveals no gallop and no friction rub.   No murmur heard. Pulmonary/Chest: Effort normal and breath sounds normal. No respiratory distress. [name] has no wheezes. [name] has no rales.  Abdominal: Soft. [name] exhibits no distension. There is no tenderness. There is no guarding.  Musculoskeletal: [name] exhibits no edema or tenderness.  Neurological: [name] is alert and oriented to person, place, and time.  Skin: Skin is warm and dry. No rash noted. [name] is not diaphoretic. No erythema.  Nursing note and vitals reviewed.    ED Treatments / Results  Labs (all labs ordered are listed, but only abnormal results are  displayed) Labs Reviewed  BASIC METABOLIC PANEL - Abnormal; Notable for the following:       Result Value   Glucose, Bld 103 (*)    BUN 24 (*)    Creatinine, Ser 1.76 (*)    GFR calc non Af Amer 24 (*)    GFR calc  Af Amer 28 (*)    All other components within normal limits  CBC - Abnormal; Notable for the following:    Platelets 141 (*)    All other components within normal limits  URINALYSIS, ROUTINE W REFLEX MICROSCOPIC - Abnormal; Notable for the following:    Leukocytes, UA TRACE (*)    Bacteria, UA RARE (*)    All other components within normal limits  I-STAT TROPONIN, ED  I-STAT TROPONIN, ED    EKG  EKG Interpretation  Date/Time:  Wednesday July 19 2017 01:38:51 EDT Ventricular Rate:  74 PR Interval:    QRS Duration: 166 QT Interval:  462 QTC Calculation: 513 R Axis:   63 Text Interpretation:  VENTRICULAR PACED RHYTHM No significant change since last tracing Confirmed by Gareth Morgan [phone]) on 07/19/2017 5:50:49 PM       Radiology Dg Chest 2 View  Result Date: 07/19/2017 CLINICAL DATA:  Mid chest pain tonight. EXAM: CHEST  2 VIEW COMPARISON:  Most recent radiograph 06/09/2017, additional priors reviewed FINDINGS: Right-sided pacemaker remains in place. Cardiomegaly is unchanged with atherosclerosis of the thoracic aorta. Chronic interstitial coarsening accentuated by lower lung volumes. Mild left basilar atelectasis or scarring. No confluent consolidation, large pleural effusion or pneumothorax. Soft tissue attenuation projects over the left mid lung. Stable osseous structures. IMPRESSION: 1. Stable cardiomegaly with thoracic aortic atherosclerosis. 2. Chronic interstitial coarsening. Left basilar atelectasis or scarring. Electronically Signed   By: Jeb Levering M.D.   On: 07/19/2017 02:24    Procedures Procedures (including critical care time)  Medications Ordered in ED Medications  cephALEXin (KEFLEX) capsule 500 mg (500 mg Oral Given 07/19/17 0446)      Initial Impression / Assessment and Plan / ED Course  I have reviewed the triage vital signs and the nursing notes.  Pertinent labs & imaging results that were available during my care of the patient were reviewed by me and considered in my medical decision making (see chart for details).     83 yo female with a history of atrial fibrillation, hypertension, hyperlipidemia, monoclonalal gammopathy, presents with concern for chest pain.  EKG without acute ischemic findings.  No dyspnea, doubt PE.  Good pulses bilaterally, CP free at this time, XR without acute findings, doubt dissection, pneumonia.  Troponin negative x2.  Patient alert and oriented, discussed possibility of observation versus discharge.  [name] had been seen 7/27 and discharged from ED after discussion with Cardiology. [name] prefers outpatient management and feel this is reasonable. [name] has remained chest pain free in the ED. [name] also reports urinary symptoms, UA shows possible UTI, given rx for keflex.  Patient discharged in stable condition with understanding of reasons to return.   Final Clinical Impressions(s) / ED Diagnoses   Final diagnoses:  Chest pain, unspecified type  Urinary tract infection without hematuria, site unspecified    New Prescriptions Discharge Medication List as of 07/19/2017  5:22 AM    START taking these medications   Details  cephALEXin (KEFLEX) 500 MG capsule Take 1 capsule (500 mg total) by mouth 3 (three) times daily., Starting Wed 07/19/2017, Until Wed 07/26/2017, Print         Gareth Morgan, MD 07/19/17 [phone]

## 2017-07-19 NOTE — ED Triage Notes (Signed)
Patient arrived with EMS from [facility] facility reports right side chest pain radiating to right shoulder and upper back this evening , she received 2 NTG sl prior to arrival , rates pain 6/10 scale , respirations unlabored.

## 2017-07-19 NOTE — ED Notes (Signed)
Report given to [facility] RN at [facility] , [facility]AR notified to transport pt.

## 2017-07-24 ENCOUNTER — Encounter (HOSPITAL_COMMUNITY): Payer: Self-pay | Admitting: *Deleted

## 2017-07-24 ENCOUNTER — Observation Stay (HOSPITAL_COMMUNITY)
Admission: EM | Admit: 2017-07-24 | Discharge: 2017-07-26 | Disposition: A | Discharge status: Home / Self Care | Payer: Medicare Other | Attending: Internal Medicine | Admitting: Internal Medicine

## 2017-07-24 ENCOUNTER — Emergency Department (HOSPITAL_COMMUNITY): Payer: Medicare Other

## 2017-07-24 DIAGNOSIS — I5042 Chronic combined systolic (congestive) and diastolic (congestive) heart failure: Secondary | ICD-10-CM | POA: Diagnosis not present

## 2017-07-24 DIAGNOSIS — I480 Paroxysmal atrial fibrillation: Secondary | ICD-10-CM | POA: Diagnosis not present

## 2017-07-24 DIAGNOSIS — I130 Hypertensive heart and chronic kidney disease with heart failure and stage 1 through stage 4 chronic kidney disease, or unspecified chronic kidney disease: Secondary | ICD-10-CM | POA: Insufficient documentation

## 2017-07-24 DIAGNOSIS — I2510 Atherosclerotic heart disease of native coronary artery without angina pectoris: Secondary | ICD-10-CM | POA: Diagnosis not present

## 2017-07-24 DIAGNOSIS — I950 Idiopathic hypotension: Secondary | ICD-10-CM

## 2017-07-24 DIAGNOSIS — R079 Chest pain, unspecified: Principal | ICD-10-CM | POA: Insufficient documentation

## 2017-07-24 DIAGNOSIS — I340 Nonrheumatic mitral (valve) insufficiency: Secondary | ICD-10-CM | POA: Diagnosis present

## 2017-07-24 DIAGNOSIS — I482 Chronic atrial fibrillation, unspecified: Secondary | ICD-10-CM | POA: Diagnosis present

## 2017-07-24 DIAGNOSIS — N184 Chronic kidney disease, stage 4 (severe): Secondary | ICD-10-CM | POA: Diagnosis not present

## 2017-07-24 DIAGNOSIS — Z79899 Other long term (current) drug therapy: Secondary | ICD-10-CM | POA: Insufficient documentation

## 2017-07-24 DIAGNOSIS — Z950 Presence of cardiac pacemaker: Secondary | ICD-10-CM | POA: Diagnosis not present

## 2017-07-24 DIAGNOSIS — E039 Hypothyroidism, unspecified: Secondary | ICD-10-CM | POA: Insufficient documentation

## 2017-07-24 DIAGNOSIS — I272 Pulmonary hypertension, unspecified: Secondary | ICD-10-CM | POA: Diagnosis not present

## 2017-07-24 DIAGNOSIS — E785 Hyperlipidemia, unspecified: Secondary | ICD-10-CM | POA: Insufficient documentation

## 2017-07-24 DIAGNOSIS — D472 Monoclonal gammopathy: Secondary | ICD-10-CM | POA: Diagnosis present

## 2017-07-24 DIAGNOSIS — D693 Immune thrombocytopenic purpura: Secondary | ICD-10-CM | POA: Diagnosis present

## 2017-07-24 DIAGNOSIS — F0390 Unspecified dementia without behavioral disturbance: Secondary | ICD-10-CM | POA: Diagnosis present

## 2017-07-24 LAB — CBC WITH DIFFERENTIAL/PLATELET
BASOS PCT: 1 %
Basophils Absolute: 0 10*3/uL (ref 0.0–0.1)
Eosinophils Absolute: 0.1 10*3/uL (ref 0.0–0.7)
Eosinophils Relative: 1 %
HEMATOCRIT: 39 % (ref 36.0–46.0)
HEMOGLOBIN: 12.2 g/dL (ref 12.0–15.0)
LYMPHS ABS: 0.7 10*3/uL (ref 0.7–4.0)
LYMPHS PCT: 11 %
MCH: 30.7 pg (ref 26.0–34.0)
MCHC: 31.3 g/dL (ref 30.0–36.0)
MCV: 98 fL (ref 78.0–100.0)
MONOS PCT: 10 %
Monocytes Absolute: 0.6 10*3/uL (ref 0.1–1.0)
NEUTROS ABS: 4.4 10*3/uL (ref 1.7–7.7)
NEUTROS PCT: 77 %
Platelets: 125 10*3/uL — ABNORMAL LOW (ref 150–400)
RBC: 3.98 MIL/uL (ref 3.87–5.11)
RDW: 14.9 % (ref 11.5–15.5)
WBC: 5.7 10*3/uL (ref 4.0–10.5)

## 2017-07-24 LAB — TROPONIN I
TROPONIN I: 0.03 ng/mL — AB (ref <0.03)
Troponin I: 0.03 ng/mL (ref <0.03)

## 2017-07-24 LAB — URINALYSIS, ROUTINE W REFLEX MICROSCOPIC
Bilirubin Urine: NEGATIVE
Glucose, UA: NEGATIVE mg/dL
Hgb urine dipstick: NEGATIVE
Ketones, ur: NEGATIVE mg/dL
LEUKOCYTES UA: NEGATIVE
NITRITE: NEGATIVE
PROTEIN: NEGATIVE mg/dL
SPECIFIC GRAVITY, URINE: 1.006 (ref 1.005–1.030)
pH: 6 (ref 5.0–8.0)

## 2017-07-24 LAB — COMPREHENSIVE METABOLIC PANEL
ALBUMIN: 3.1 g/dL — AB (ref 3.5–5.0)
ALK PHOS: 76 U/L (ref 38–126)
ALT: 16 U/L (ref 14–54)
ANION GAP: 7 (ref 5–15)
AST: 23 U/L (ref 15–41)
BILIRUBIN TOTAL: 0.8 mg/dL (ref 0.3–1.2)
BUN: 21 mg/dL — AB (ref 6–20)
CALCIUM: 9.2 mg/dL (ref 8.9–10.3)
CO2: 29 mmol/L (ref 22–32)
Chloride: 105 mmol/L (ref 101–111)
Creatinine, Ser: 1.81 mg/dL — ABNORMAL HIGH (ref 0.44–1.00)
GFR calc Af Amer: 27 mL/min — ABNORMAL LOW (ref >60)
GFR calc non Af Amer: 23 mL/min — ABNORMAL LOW (ref >60)
GLUCOSE: 115 mg/dL — AB (ref 65–99)
Potassium: 4.2 mmol/L (ref 3.5–5.1)
Sodium: 141 mmol/L (ref 135–145)
TOTAL PROTEIN: 6.2 g/dL — AB (ref 6.5–8.1)

## 2017-07-24 LAB — I-STAT TROPONIN, ED: Troponin i, poc: 0.01 ng/mL (ref 0.00–0.08)

## 2017-07-24 LAB — LIPASE, BLOOD: LIPASE: 33 U/L (ref 11–51)

## 2017-07-24 MED ORDER — SODIUM CHLORIDE 0.9 % IV SOLN
INTRAVENOUS | Status: DC
  Administered 2017-07-24 – 2017-07-25 (×3): via INTRAVENOUS

## 2017-07-24 MED ORDER — BACLOFEN 10 MG PO TABS: 10.0000 mg | ORAL_TABLET | Freq: Two times a day (BID) | ORAL | Status: DC | PRN

## 2017-07-24 MED ORDER — ACETAMINOPHEN 325 MG PO TABS
650.0000 mg | ORAL_TABLET | ORAL | Status: DC | PRN
  Administered 2017-07-24 – 2017-07-26 (×4): 650 mg via ORAL
  Filled 2017-07-24 (×4): qty 2

## 2017-07-24 MED ORDER — TORSEMIDE 20 MG PO TABS: 20.0000 mg | ORAL_TABLET | Freq: Every day | ORAL | Status: DC

## 2017-07-24 MED ORDER — AMIODARONE HCL 200 MG PO TABS
200.0000 mg | ORAL_TABLET | Freq: Every day | ORAL | Status: DC
  Administered 2017-07-24 – 2017-07-26 (×3): 200 mg via ORAL
  Filled 2017-07-24 (×3): qty 1

## 2017-07-24 MED ORDER — APIXABAN 2.5 MG PO TABS
2.5000 mg | ORAL_TABLET | Freq: Two times a day (BID) | ORAL | Status: DC
  Administered 2017-07-24 – 2017-07-26 (×5): 2.5 mg via ORAL
  Filled 2017-07-24 (×5): qty 1

## 2017-07-24 MED ORDER — GI COCKTAIL ~~LOC~~
30.0000 mL | Freq: Once | ORAL | Status: AC
  Administered 2017-07-24: 30 mL via ORAL
  Filled 2017-07-24: qty 30

## 2017-07-24 MED ORDER — NITROGLYCERIN 0.4 MG SL SUBL: 0.4000 mg | SUBLINGUAL_TABLET | SUBLINGUAL | Status: DC | PRN

## 2017-07-24 MED ORDER — ONDANSETRON HCL 4 MG/2ML IJ SOLN
4.0000 mg | Freq: Four times a day (QID) | INTRAMUSCULAR | Status: DC | PRN
  Administered 2017-07-24 – 2017-07-26 (×2): 4 mg via INTRAVENOUS
  Filled 2017-07-24 (×2): qty 2

## 2017-07-24 MED ORDER — PANTOPRAZOLE SODIUM 40 MG PO TBEC
40.0000 mg | DELAYED_RELEASE_TABLET | Freq: Every day | ORAL | Status: DC
  Administered 2017-07-25: 40 mg via ORAL
  Filled 2017-07-24: qty 1

## 2017-07-24 MED ORDER — SODIUM CHLORIDE 0.9 % IV BOLUS (SEPSIS)
500.0000 mL | Freq: Once | INTRAVENOUS | Status: AC
  Administered 2017-07-24: 500 mL via INTRAVENOUS

## 2017-07-24 MED ORDER — CITALOPRAM HYDROBROMIDE 10 MG PO TABS
10.0000 mg | ORAL_TABLET | Freq: Every day | ORAL | Status: DC
  Administered 2017-07-25 – 2017-07-26 (×2): 10 mg via ORAL
  Filled 2017-07-24 (×2): qty 1

## 2017-07-24 MED ORDER — LEVOTHYROXINE SODIUM 75 MCG PO TABS
75.0000 ug | ORAL_TABLET | Freq: Every day | ORAL | Status: DC
  Administered 2017-07-25 – 2017-07-26 (×2): 75 ug via ORAL
  Filled 2017-07-24 (×3): qty 1

## 2017-07-24 MED ORDER — SIMVASTATIN 10 MG PO TABS
10.0000 mg | ORAL_TABLET | Freq: Every day | ORAL | Status: DC
  Administered 2017-07-24 – 2017-07-26 (×3): 10 mg via ORAL
  Filled 2017-07-24 (×3): qty 1

## 2017-07-24 NOTE — ED Notes (Signed)
Hospitalist  paged in reference to pt blood pressures

## 2017-07-24 NOTE — ED Notes (Signed)
Pt up to bedside commode. Complaints of increased "prickly" feeling in center of chest when getting up

## 2017-07-24 NOTE — ED Notes (Signed)
Patient transported to X-ray 

## 2017-07-24 NOTE — H&P (Signed)
History and Physical    [name] BLT:[id number] DOB: 07/28/1925 DOA: 07/24/2017   PCP: Burnis Medin, MD   Attending physician: Marily Memos  Patient coming [name]   Chief Complaint: Chest pain  HPI: [name] is a 83 y.o. female with medical history significant for atrial fibrillation on chronic anticoagulation, chronic combined systolic and diastolic heart failure with pulmonary hypertension, MGUS and known thrombocytopenia, diffuse nonobstructive CAD per cath 2015 on medical management, dementia, stage IV chronic kidney disease who presents to the ER with atypical chest discomfort. Patient reports several months of nocturnal chest pain without symptoms of reflux that occasionally associated with nausea. She states she walks frequently with her rolling walker and at a rapid pace without any exertional chest pain or shortness of breath. She reports that if she lays on her left elbow the chest pain seemed to worsen She is also reporting black stools but takes iron. She denies postprandial nausea or abdominal pain. In the ER initial troponin negative, blood pressure somewhat suboptimal for patient with systolics in the mid 00T to low 100s. EKG demonstrated ventricular paced rhythm. Chest x-ray without evidence of heart failure. Of note patient has a loud systolic murmur over the mitral area.  ED Course:  Vital Signs: BP (!) 101/45   Pulse 70   Temp 98.4 F (36.9 C) (Oral)   Resp 19   SpO2 97%  2 view CXR: Neg Lab data: Sodium 141, potassium 4.2, chloride 105, CO2 29, glucose 1:15, BUN 21, creatinine 1.81, anion gap 7, albumin 3.1, poc troponin 0.01, white count 5700 with hemoglobin 12.2, platelets 125,000, normal differential, urinalysis unremarkable. Medications and treatments: None  Review of Systems:  In addition to the HPI above,  No Fever-chills, myalgias or other constitutional symptoms No Headache, changes with Vision or hearing, new  weakness, tingling, numbness in any extremity, dizziness, dysarthria or word finding difficulty, gait disturbance or imbalance, tremors or seizure activity No problems swallowing food or Liquids, indigestion/reflux, choking or coughing while eating, abdominal pain with or after eating No Cough or Shortness of Breath, palpitations, orthopnea or DOE No Abdominal pain, N/V, melena,hematochezia, dark tarry stools, constipation No dysuria, malodorous urine, hematuria or flank pain No new skin rashes, lesions, masses or bruises, No new joint pains, aches, swelling or redness No recent unintentional weight gain or loss No polyuria, polydypsia or polyphagia   Past Medical History:  Diagnosis Date  . Anemia   . Atrial fibrillation ([place])    a. Dx 01/2014->Eliquis started.  . Cancer ([place])   . Chest pain    a. 2011 Neg MV;  b. 01/2014 Cath: LM 20-30, LAD nl, D1 nl, LCX nl, OM1 nl, RCA dom 30-7m, PD/PL nl, EF 65%->Med Rx; c. 03/2016 MV: no ischemia/infarct. EF 63%.  . Degenerative joint disease   . Diastolic CHF, acute on chronic ([place]) 01/31/2012  . Dyslipidemia   . Dysrhythmia   . Family history of adverse reaction to anesthesia    " multiple family members have difficulty waking "  . GERD (gastroesophageal reflux disease)   . History of skin cancer    tafeen/ non melanoma.   Marland Kitchen Hx of varicella   . Hypertension   . Hypothyroidism   . Monoclonal gammopathies   . Pacemaker   . Palpitations    PVCs/bigeminy on event in May 2009 revealing this was relatively asymptomatic  . Right lower quadrant abdominal pain 07/19/2012   Recurrent  With nausea    This  is new since she had ct for llq pain in [facility]    R/o appendiceal problem  Hernia  Get ct scan  And plan  Fu     . Thrombocytopenia ([place])     Past Surgical History:  Procedure Laterality Date  . CARDIOVERSION N/A 02/26/2014   Procedure: CARDIOVERSION AT BEDSIDE;  Surgeon: Pixie Casino, MD;  Location: [place];  Service: Cardiovascular;  Laterality:  N/A;  . CARDIOVERSION N/A 04/22/2014   Procedure: CARDIOVERSION (BEDSIDE);  Surgeon: [name], MD;  Location: [facility] OR;  Service: Cardiovascular;  Laterality: N/A;  . CATARACT EXTRACTION     Bilateral  implantt  . CESAREAN SECTION     times 2  . CHOLECYSTECTOMY  1999  . ESOPHAGOGASTRODUODENOSCOPY N/A 07/06/2014   Procedure: ESOPHAGOGASTRODUODENOSCOPY (EGD);  Surgeon: Ladene Artist, MD;  Location: [facility] ENDOSCOPY;  Service: Endoscopy;  Laterality: N/A;  . INSERT / REPLACE / REMOVE PACEMAKER    . LEFT HEART CATHETERIZATION WITH CORONARY ANGIOGRAM N/A 01/29/2014   Procedure: LEFT HEART CATHETERIZATION WITH CORONARY ANGIOGRAM;  Surgeon: Blane Ohara, MD;  Location: [facility] CATH LAB;  Service: Cardiovascular;  Laterality: N/A;  . PACEMAKER INSERTION  04/23/2014   STJ Assurity dual chamber pacemaker implanted by Dr Rayann Heman  . PERMANENT PACEMAKER INSERTION N/A 04/23/2014   Procedure: PERMANENT PACEMAKER INSERTION;  Surgeon: Evans Lance, MD;  Location: [facility] CATH LAB;  Service: Cardiovascular;  Laterality: N/A;  . RIGHT HEART CATHETERIZATION N/A 09/22/2014   Procedure: RIGHT HEART CATH;  Surgeon: Jolaine Artist, MD;  Location: [facility] CATH LAB;  Service: Cardiovascular;  Laterality: N/A;  . SHOULDER SURGERY  1996   Right     Social History   Social History  . Marital status: Widowed    Spouse name: N/A  . Number of children: 3  . Years of education: N/A   Occupational History  .  Retired    [employer], book keeping   Social History Main Topics  . Smoking status: Never Smoker  . Smokeless tobacco: Never Used  . Alcohol use No  . Drug use: No  . Sexual activity: No   Other Topics Concern  . 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 Eats with her.       Daughter Joseph Art handles medications. Sees HH and PT once a week.   Helper  [place]  there Friday 1-3 pm   Pt lives alone, Joseph Art lives 30 min away.  05-15-17    Mobility: Rolling walker Work history: Not obtained   Allergies  Allergen Reactions  . Other Shortness Of Breath    Detergents, perfumes and other strong odor emitting compounds  . Tramadol Shortness Of Breath and Nausea Only  . Ibuprofen Other (See Comments)    Told not to take med    Family History  Problem Relation Age of Onset  . Coronary artery disease Father   . Heart disease Father   . Heart attack Father   . Alzheimer's disease Mother   . Lung cancer Brother 51  . Lung cancer Unknown       Prior to Admission medications   Medication Sig Start Date  End Date Taking? Authorizing Provider  acetaminophen (TYLENOL) 325 MG tablet Take 650 mg by mouth every 4 (four) hours as needed for moderate pain.    [provider]  amiodarone (PACERONE) 200 MG tablet Take 1 tablet (200 mg total) by mouth daily. 11/25/16   Bensimhon, Shaune Pascal, MD  baclofen (LIORESAL) 10 MG tablet Take 10 mg by mouth 2 (two) times daily as needed for muscle spasms.    [provider]  cephALEXin (KEFLEX) 500 MG capsule Take 1 capsule (500 mg total) by mouth 3 (three) times daily. 07/19/17 07/26/17  Gareth Morgan, MD  citalopram (CELEXA) 10 MG tablet Take 10 mg by mouth daily.    [provider]  denosumab (PROLIA) 60 MG/ML SOLN injection Inject 60 mg into the skin every 6 (six) months. Administer in upper arm, thigh, or abdomen    [provider]  ELIQUIS 2.5 MG TABS tablet TAKE ONE TABLET BY MOUTH TWICE DAILY 05/22/17   Bensimhon, Shaune Pascal, MD  Ferrous Sulfate (IRON) 325 (65 Fe) MG TABS Take 1 tablet by mouth daily. 07/01/16   Johnson, Clanford L, MD  levothyroxine (SYNTHROID, LEVOTHROID) 75 MCG tablet TAKE 1 TABLET BY MOUTH ONCE DAILY 05/22/17   Panosh, Standley Brooking, MD  loratadine (CLARITIN) 10 MG tablet Take 1 tablet (10 mg  total) by mouth daily. Patient taking differently: Take 10 mg by mouth daily as needed for allergies or rhinitis.  03/10/17   Jani Gravel, MD  meclizine (ANTIVERT) 12.5 MG tablet Take 1 tablet (12.5 mg total) by mouth 2 (two) times daily as needed for dizziness. 09/14/16   Bensimhon, Shaune Pascal, MD  metolazone (ZAROXOLYN) 2.5 MG tablet Take 1 tablet (2.5 mg total) by mouth as needed (for weight 134 lb or greater). 03/09/16   Bensimhon, Shaune Pascal, MD  nitroGLYCERIN (NITROSTAT) 0.4 MG SL tablet Place 1 tablet (0.4 mg total) under the tongue every 5 (five) minutes as needed. For chest pain 05/06/16   Bensimhon, Shaune Pascal, MD  pantoprazole (PROTONIX) 40 MG tablet Take 40 mg by mouth daily.    [provider]  polyethylene glycol (MIRALAX) packet Take 17 g by mouth daily. Mix with 8 ounces of liquid. Patient taking differently: Take 17 g by mouth daily as needed for mild constipation. Mix with 8 ounces of liquid. 03/11/17   Quintella Reichert, MD  potassium chloride SA (K-DUR,KLOR-CON) 20 MEQ tablet Take 1 tablet (20 mEq total) by mouth daily. Take an additional 17meq with Metolazone. 06/09/17   Pattricia Boss, MD  senna (SENOKOT) 8.6 MG TABS tablet Take 2 tablets (17.2 mg total) by mouth at bedtime. Patient taking differently: Take 2 tablets by mouth at bedtime as needed for mild constipation.  11/13/16   Patrecia Pour, Christean Grief, MD  simvastatin (ZOCOR) 10 MG tablet TAKE ONE TABLET BY MOUTH AT BEDTIME 02/14/17   Larey Dresser, MD  torsemide (DEMADEX) 20 MG tablet Take 1 tablet (20 mg total) by mouth daily. Please start on 11/22/2016 06/09/17   Pattricia Boss, MD    Physical Exam: Vitals:   07/24/17 1330 07/24/17 1345 07/24/17 1400 07/24/17 1445  BP: (!) 116/91 111/84 100/60 (!) 101/45  Pulse: 76 78 70 70  Resp: 15 17 20 19   Temp:      TempSrc:      SpO2: 97% 97% 97% 97%      Constitutional: NAD, calm, comfortable Eyes: PERRL, lids and conjunctivae normal ENMT: Mucous membranes are moist. Posterior  pharynx clear of any exudate or lesions.Normal  dentition.  Neck: normal, supple, no masses, no thyromegaly Respiratory: clear to auscultation bilaterally, no wheezing, no crackles. Normal respiratory effort. No accessory muscle use.  Cardiovascular: Regular rate and rhythm, Grade 4/6 tight sounding systolic murmur fourth intercostal space, left sternal border consistent with mitral valve murmur; no rubs / gallops. No extremity edema. 2+ pedal pulses. No carotid bruits.  Abdomen: no tenderness, no masses palpated. No hepatosplenomegaly. Bowel sounds positive.  Musculoskeletal: no clubbing / cyanosis. No joint deformity upper and lower extremities. Good ROM, no contractures. Normal muscle tone.  Skin: no rashes, lesions, ulcers. No induration Neurologic: CN 2-12 grossly intact. Sensation intact, DTR normal. Strength 5/5 x all 4 extremities.  Psychiatric: Alert and oriented x 3. Normal mood.    Labs on Admission: I have personally reviewed following labs and imaging studies  CBC:  Recent Labs Lab 07/19/17 0225 07/24/17 1132  WBC 7.3 5.7  NEUTROABS  --  4.4  HGB 12.4 12.2  HCT 39.1 39.0  MCV 97.3 98.0  PLT 141* 322*   Basic Metabolic Panel:  Recent Labs Lab 07/19/17 0225 07/24/17 1132  NA 140 141  K 3.6 4.2  CL 105 105  CO2 27 29  GLUCOSE 103* 115*  BUN 24* 21*  CREATININE 1.76* 1.81*  CALCIUM 9.1 9.2   GFR: CrCl cannot be calculated (Unknown ideal weight.). Liver Function Tests:  Recent Labs Lab 07/24/17 1132  AST 23  ALT 16  ALKPHOS 76  BILITOT 0.8  PROT 6.2*  ALBUMIN 3.1*    Recent Labs Lab 07/24/17 1132  LIPASE 33   No results for input(s): AMMONIA in the last 168 hours. Coagulation Profile: No results for input(s): INR, PROTIME in the last 168 hours. Cardiac Enzymes: No results for input(s): CKTOTAL, CKMB, CKMBINDEX, TROPONINI in the last 168 hours. BNP (last 3 results) No results for input(s): PROBNP in the last 8760 hours. HbA1C: No results for  input(s): HGBA1C in the last 72 hours. CBG: No results for input(s): GLUCAP in the last 168 hours. Lipid Profile: No results for input(s): CHOL, HDL, LDLCALC, TRIG, CHOLHDL, LDLDIRECT in the last 72 hours. Thyroid Function Tests: No results for input(s): TSH, T4TOTAL, FREET4, T3FREE, THYROIDAB in the last 72 hours. Anemia Panel: No results for input(s): VITAMINB12, FOLATE, FERRITIN, TIBC, IRON, RETICCTPCT in the last 72 hours. Urine analysis:    Component Value Date/Time   COLORURINE STRAW (A) 07/24/2017 1351   APPEARANCEUR CLEAR 07/24/2017 1351   LABSPEC 1.006 07/24/2017 1351   PHURINE 6.0 07/24/2017 1351   GLUCOSEU NEGATIVE 07/24/2017 1351   GLUCOSEU NEGATIVE 12/19/2011 0956   HGBUR NEGATIVE 07/24/2017 1351   BILIRUBINUR NEGATIVE 07/24/2017 1351   BILIRUBINUR neg 02/27/2017 1618   KETONESUR NEGATIVE 07/24/2017 1351   PROTEINUR NEGATIVE 07/24/2017 1351   UROBILINOGEN 0.2 02/27/2017 1618   UROBILINOGEN 0.2 09/10/2015 1150   NITRITE NEGATIVE 07/24/2017 1351   LEUKOCYTESUR NEGATIVE 07/24/2017 1351   Sepsis Labs: @LABRCNTIP (procalcitonin:4,lacticidven:4) )No results found for this or any previous visit (from the past 240 hour(s)).   Radiological Exams on Admission: Dg Chest 2 View  Result Date: 07/24/2017 CLINICAL DATA:  Midsternal chest pain extending into the right breast for 1 day. History of congestive heart failure and hypertension. EXAM: CHEST  2 VIEW COMPARISON:  07/19/2017 and 06/09/2017 radiographs. FINDINGS: Overall improved pulmonary aeration. Right subclavian pacemaker leads appear unchanged within the right atrium and right ventricle. The heart size is normal. There is aortic atherosclerosis. There is interval improved aeration of the lung bases. Focal density projecting  over the left perihilar region on the frontal examination has no definite correlative finding on the lateral view. This could be related to a rib fracture or parenchymal inflammation and appears similar  to the most recent study, although is new from 6 weeks ago. No definite acute osseous findings. Postsurgical changes noted in the right humeral head. There is a thoracolumbar scoliosis. IMPRESSION: No definite acute findings. Left perihilar density is only seen on the frontal examination and may be related to healing rib fracture or underlying parenchymal inflammation/contusion. Electronically Signed   By: Richardean Sale M.D.   On: 07/24/2017 12:10    EKG: (Independently reviewed) 100% ventricular paced rhythm with a ventricular rate of 70 bpm, QTC 529 ms in setting of underlying left bundle branch block  Assessment/Plan Principal Problem:   Chest pain/known diffuse nonobstructive CAD -Presents from skilled nursing facility with reports of central chest pain worse at night and when supine without any exertional component. -Occasionally associated with nausea but no other associated symptoms. -Pain is not reproduced with palpation over chest wall and overall sounds more consistent with GI etiology -GI cocktail 1 -Continue PPI -Not on beta blocker prior to admission -Echocardiogram -Cycle troponin -SL NTG prn CP -In 2015 patient underwent R&L heart cath that revealed increased right side pressures with diffuse nonobstructed CAD with medical therapy recommended by cardiology  Active Problems:   Chronic combined systolic and diastolic CHF, NYHA class 2 /pulmonary hypertension/mild mitral regurgitation -Appears euvolemic and actually appears somewhat dry noting suboptimal blood pressure readings -Hold diuretic and Zaroxolyn -OVS -Echocardiogram July 2018: EF 45-50% with akinesis of the mid apical inferior septal myocardium, mild LVH, grade 3 diastolic dysfunction, pulmonary hypertension 35 mmHg, mild mitral regurgitation -Now has loud systolic murmur and in setting of chest pain w/ lower blood pressure readings will obtain echocardiogram as above  **SBP drifting down into the 80s- will increase  IVFs to 100/hr and give 500 cc bolus x 1    CKD (chronic kidney disease), stage IV  -Renal function stable and at baseline -Follow labs -Given severity of kidney disease not a candidate for nonemergent cardiac catheterization    Dementia -Resides at nursing facility and daughter is power of attorney    Atrial fibrillation, chronic  -Currently rate controlled on amiodarone with 1% ventricular pacing -Continue eliquis -CHADVASC=6    Monoclonal gammopathy of unknown significance (MGUS)/Thrombocytopenia, idiopathic  -Anemia stable and at baseline with hemoglobin 12.2 -Patient reporting black stools but also on iron but for completeness of exam obtained FOB      DVT prophylaxis: Eliquis Code Status: DO NOT RESUSCITATE  Family Communication: No family at bedside  Disposition Plan: Return to SNF Consults called: None     ELLIS,ALLISON L. ANP-BC [facility] Pager [phone]   If 7PM-7AM, please contact night-coverage [email or web address] Password [email or web address]  07/24/2017, 3:02 PM

## 2017-07-24 NOTE — ED Notes (Signed)
Patient's pacemaker interrogated 

## 2017-07-24 NOTE — ED Triage Notes (Signed)
Pt reported epigastric  - CP . Pt has pacemaker . Meds given per ED admission. 324mg  ASA ,3 NGT SL . CBG 146 and BP 98/53.

## 2017-07-24 NOTE — ED Notes (Signed)
Patient given bag meal. 

## 2017-07-24 NOTE — ED Provider Notes (Signed)
[facility] DEPT Provider Note   CSN: [id number] Arrival date & time: 07/24/17  1039     History   Chief Complaint Chief Complaint  Patient presents with  . Abdominal Pain    HPI [name] is a 83 y.o. female.  HPI   Chest pain started yesterday at lunch time. Acute onset lower, middle sternum chest pain, felt like an aching pain. Had some epigastric pain as well.  IT resolved last night, then at   130AM her chest pain returned. They gave her nitro SL, first one helped some but the second and third nitro did not help.  Reports it is worse laying down and better sitting up. No recent falls or illnesses.  No associated nausea or dyspnea. Was seen last week and returned home after negative troponins. Reports concerned that CP has returned frequently over last 24hr. Received 324mg  ASA.  Past Medical History:  Diagnosis Date  . Anemia   . Atrial fibrillation ([place])    a. Dx 01/2014->Eliquis started.  . Cancer ([place])   . Chest pain    a. 2011 Neg MV;  b. 01/2014 Cath: LM 20-30, LAD nl, D1 nl, LCX nl, OM1 nl, RCA dom 30-19m, PD/PL nl, EF 65%->Med Rx; c. 03/2016 MV: no ischemia/infarct. EF 63%.  . Degenerative joint disease   . Diastolic CHF, acute on chronic ([place]) 01/31/2012  . Dyslipidemia   . Dysrhythmia   . Family history of adverse reaction to anesthesia    " multiple family members have difficulty waking "  . GERD (gastroesophageal reflux disease)   . History of skin cancer    tafeen/ non melanoma.   Marland Kitchen Hx of varicella   . Hypertension   . Hypothyroidism   . Monoclonal gammopathies   . Pacemaker   . Palpitations    PVCs/bigeminy on event in May 2009 revealing this was relatively asymptomatic  . Right lower quadrant abdominal pain 07/19/2012   Recurrent  With nausea    This is new since she had ct for llq pain in [facility]    R/o appendiceal problem  Hernia  Get ct scan  And plan  Fu     . Thrombocytopenia [facility])     Patient Active Problem List   Diagnosis Date Noted    . CAD (coronary artery disease) 07/24/2017  . Chronic combined systolic and diastolic CHF, NYHA class 2 ([place]) 07/24/2017  . CKD (chronic kidney disease), stage IV ([place]) 07/24/2017  . Dementia 07/24/2017  . Atrial fibrillation, chronic ([place]) 07/24/2017  . Monoclonal gammopathy of unknown significance (MGUS) 07/24/2017  . Thrombocytopenia, idiopathic ([place]) 07/24/2017  . Pulmonary HTN ([place]) 07/24/2017  . Mild mitral regurgitation 07/24/2017  . Idiopathic hypotension   . Acute urinary retention 06/01/2017  . Acute on chronic diastolic (congestive) heart failure ([place]) 05/27/2017  . Fall 05/27/2017  . Acute metabolic encephalopathy 67/67/2094  . Mitral regurgitation 03/10/2017  . Mitral stenosis 03/10/2017  . Chest pain 03/08/2017  . Dehydration 11/18/2016  . Intractable nausea and vomiting 11/18/2016  . Generalized weakness 11/18/2016  . Acute kidney injury superimposed on chronic kidney disease ([place]) 11/11/2016  . Hypokalemia 11/11/2016  . Hypertensive heart disease 04/01/2016  . PAF (paroxysmal atrial fibrillation) ([place]) 03/31/2016  . Chest pain with moderate risk for cardiac etiology, negative MI and Nuc study, + muscular skeletal   . Atypical chest pain 01/21/2016  . CKD (chronic kidney disease) stage 4, GFR 15-29 ml/min (HCC) 05/09/2015  . Anemia of chronic disease 05/09/2015  . Dyslipidemia  05/09/2015  . Elevated troponin   . Iron deficiency anemia 11/21/2014  . Pacemaker 04/27/2014  . Chronic anticoagulation 02/25/2014  . Chronic diastolic CHF (congestive heart failure) ([place]) 02/24/2014  . Atrial fibrillation ([place]) 01/29/2014  . MGUS (monoclonal gammopathy of unknown significance) 08/01/2013  . Thrombocytopenia ([place])   . Hypothyroidism 02/19/2012  . Hyperlipidemia 12/15/2008  . HYPERTENSION, BENIGN 12/15/2008    Past Surgical History:  Procedure Laterality Date  . CARDIOVERSION N/A 02/26/2014   Procedure: CARDIOVERSION AT BEDSIDE;  Surgeon: Pixie Casino, MD;   Location: [facility];  Service: Cardiovascular;  Laterality: N/A;  . CARDIOVERSION N/A 04/22/2014   Procedure: CARDIOVERSION (BEDSIDE);  Surgeon: [name], MD;  Location: [facility] OR;  Service: Cardiovascular;  Laterality: N/A;  . CATARACT EXTRACTION     Bilateral  implantt  . CESAREAN SECTION     times 2  . CHOLECYSTECTOMY  1999  . ESOPHAGOGASTRODUODENOSCOPY N/A 07/06/2014   Procedure: ESOPHAGOGASTRODUODENOSCOPY (EGD);  Surgeon: Ladene Artist, MD;  Location: [facility] ENDOSCOPY;  Service: Endoscopy;  Laterality: N/A;  . INSERT / REPLACE / REMOVE PACEMAKER    . LEFT HEART CATHETERIZATION WITH CORONARY ANGIOGRAM N/A 01/29/2014   Procedure: LEFT HEART CATHETERIZATION WITH CORONARY ANGIOGRAM;  Surgeon: Blane Ohara, MD;  Location: [facility] CATH LAB;  Service: Cardiovascular;  Laterality: N/A;  . PACEMAKER INSERTION  04/23/2014   STJ Assurity dual chamber pacemaker implanted by Dr Rayann Heman  . PERMANENT PACEMAKER INSERTION N/A 04/23/2014   Procedure: PERMANENT PACEMAKER INSERTION;  Surgeon: Evans Lance, MD;  Location: [facility] CATH LAB;  Service: Cardiovascular;  Laterality: N/A;  . RIGHT HEART CATHETERIZATION N/A 09/22/2014   Procedure: RIGHT HEART CATH;  Surgeon: Jolaine Artist, MD;  Location: [facility] CATH LAB;  Service: Cardiovascular;  Laterality: N/A;  . SHOULDER SURGERY  1996   Right     OB History    No data available       Home Medications    Prior to Admission medications   Medication Sig Start Date End Date Taking? Authorizing Provider  acetaminophen (TYLENOL) 325 MG tablet Take 650 mg by mouth every 4 (four) hours as needed (for pain).    Yes [provider]  amiodarone (PACERONE) 200 MG tablet Take 1 tablet (200 mg total) by mouth daily. 11/25/16  Yes Bensimhon, Shaune Pascal, MD  baclofen (LIORESAL) 10 MG tablet Take 10 mg by mouth every 12 (twelve) hours as needed for muscle spasms.    Yes [provider]  cephALEXin (KEFLEX) 500 MG capsule Take 1 capsule (500 mg total) by mouth 3  (three) times daily. 07/19/17 07/26/17 Yes Gareth Morgan, MD  citalopram (CELEXA) 10 MG tablet Take 10 mg by mouth daily.   Yes [provider]  ELIQUIS 2.5 MG TABS tablet TAKE ONE TABLET BY MOUTH TWICE DAILY Patient taking differently: Take 2.5 mg by mouth two times a day 05/22/17  Yes Bensimhon, Shaune Pascal, MD  Ferrous Sulfate (IRON) 325 (65 Fe) MG TABS Take 1 tablet by mouth daily. Patient taking differently: Take 325 mg by mouth daily.  07/01/16  Yes Johnson, Clanford L, MD  levothyroxine (SYNTHROID, LEVOTHROID) 75 MCG tablet TAKE 1 TABLET BY MOUTH ONCE DAILY Patient taking differently: Take 75 mcg by mouth once a day 05/22/17  Yes Panosh, Standley Brooking, MD  loratadine (CLARITIN) 10 MG tablet Take 1 tablet (10 mg total) by mouth daily. 03/10/17  Yes Jani Gravel, MD  magnesium hydroxide (MILK OF MAGNESIA) 400 MG/5ML suspension Take 30 mLs by mouth daily  as needed for mild constipation.   Yes [provider]  meclizine (ANTIVERT) 12.5 MG tablet Take 1 tablet (12.5 mg total) by mouth 2 (two) times daily as needed for dizziness. 09/14/16  Yes Bensimhon, Shaune Pascal, MD  metolazone (ZAROXOLYN) 2.5 MG tablet Take 1 tablet (2.5 mg total) by mouth as needed (for weight 134 lb or greater). Patient taking differently: Take 2.5 mg by mouth daily as needed (if weight is 134 pounds or greater).  03/09/16  Yes Bensimhon, Shaune Pascal, MD  nitroGLYCERIN (NITROSTAT) 0.4 MG SL tablet Place 1 tablet (0.4 mg total) under the tongue every 5 (five) minutes as needed. For chest pain Patient taking differently: Place 0.4 mg under the tongue every 5 (five) minutes as needed for chest pain.  05/06/16  Yes Bensimhon, Shaune Pascal, MD  pantoprazole (PROTONIX) 40 MG tablet Take 40 mg by mouth daily.   Yes [provider]  polyethylene glycol (MIRALAX) packet Take 17 g by mouth daily. Mix with 8 ounces of liquid. 03/11/17  Yes Quintella Reichert, MD  potassium chloride SA (K-DUR,KLOR-CON) 20 MEQ tablet Take 1 tablet (20 mEq  total) by mouth daily. Take an additional 35meq with Metolazone. Patient taking differently: Take 20 mEq by mouth daily.  06/09/17  Yes Pattricia Boss, MD  senna (SENOKOT) 8.6 MG TABS tablet Take 2 tablets (17.2 mg total) by mouth at bedtime. 11/13/16  Yes Patrecia Pour, Christean Grief, MD  simvastatin (ZOCOR) 10 MG tablet TAKE ONE TABLET BY MOUTH AT BEDTIME Patient taking differently: Take 10 mg by mouth at bedtime 02/14/17  Yes Larey Dresser, MD  torsemide (DEMADEX) 20 MG tablet Take 1 tablet (20 mg total) by mouth daily. Please start on 11/22/2016 Patient taking differently: Take 20 mg by mouth daily.  06/09/17  Yes Pattricia Boss, MD  Zinc Oxide 6 % CREA Apply 1 application topically daily. APPLY TO BUTTICKS   Yes [provider]  denosumab (PROLIA) 60 MG/ML SOLN injection Inject 60 mg into the skin every 6 (six) months. Administer in upper arm, thigh, or abdomen    [provider]    Family History Family History  Problem Relation Age of Onset  . Coronary artery disease Father   . Heart disease Father   . Heart attack Father   . Alzheimer's disease Mother   . Lung cancer Brother 61  . Lung cancer Unknown     Social History Social History  Substance Use Topics  . Smoking status: Never Smoker  . Smokeless tobacco: Never Used  . Alcohol use No     Allergies   Other; Tramadol; and Ibuprofen   Review of Systems Review of Systems  Constitutional: Negative for fever.  HENT: Negative for sore throat.   Eyes: Negative for visual disturbance.  Respiratory: Negative for cough and shortness of breath.   Cardiovascular: Positive for chest pain.  Gastrointestinal: Negative for abdominal pain, nausea and vomiting.  Genitourinary: Positive for dysuria and frequency. Negative for difficulty urinating.  Musculoskeletal: Negative for back pain and neck pain.  Skin: Negative for rash.  Neurological: Negative for syncope and headaches.     Physical Exam Updated Vital Signs BP  (!) 102/54 (BP Location: Right Arm)   Pulse 70   Temp 97.9 F (36.6 C) (Oral)   Resp 20   Ht 4\' 10"  (1.473 m)   Wt 55.2 kg (121 lb 12.8 oz)   SpO2 99%   BMI 25.46 kg/m   Physical Exam  Constitutional: She is oriented to person,  place, and time. She appears well-developed and well-nourished. No distress.  HENT:  Head: Normocephalic and atraumatic.  Eyes: Conjunctivae and EOM are normal.  Neck: Normal range of motion.  Cardiovascular: Normal rate, regular rhythm, normal heart sounds and intact distal pulses.  Exam reveals no gallop and no friction rub.   No murmur heard. Pulmonary/Chest: Effort normal and breath sounds normal. No respiratory distress. She has no wheezes. She has no rales. She exhibits no tenderness.  Abdominal: Soft. She exhibits no distension. There is no tenderness. There is no guarding.  Musculoskeletal: She exhibits no edema or tenderness.  Neurological: She is alert and oriented to person, place, and time.  Skin: Skin is warm and dry. No rash noted. She is not diaphoretic. No erythema.  Nursing note and vitals reviewed.    ED Treatments / Results  Labs (all labs ordered are listed, but only abnormal results are displayed) Labs Reviewed  CBC WITH DIFFERENTIAL/PLATELET - Abnormal; Notable for the following:       Result Value   Platelets 125 (*)    All other components within normal limits  COMPREHENSIVE METABOLIC PANEL - Abnormal; Notable for the following:    Glucose, Bld 115 (*)    BUN 21 (*)    Creatinine, Ser 1.81 (*)    Total Protein 6.2 (*)    Albumin 3.1 (*)    GFR calc non Af Amer 23 (*)    GFR calc Af Amer 27 (*)    All other components within normal limits  URINALYSIS, ROUTINE W REFLEX MICROSCOPIC - Abnormal; Notable for the following:    Color, Urine STRAW (*)    All other components within normal limits  TROPONIN I - Abnormal; Notable for the following:    Troponin I 0.03 (*)    All other components within normal limits  TROPONIN I -  Abnormal; Notable for the following:    Troponin I 0.03 (*)    All other components within normal limits  URINE CULTURE  LIPASE, BLOOD  TROPONIN I  I-STAT TROPONIN, ED    EKG  EKG Interpretation  Date/Time:  Monday July 24 2017 10:47:22 EDT Ventricular Rate:  70 PR Interval:    QRS Duration: 150 QT Interval:  490 QTC Calculation: 529 R Axis:   164 Text Interpretation:  Ventricular-paced rhythm Abnormal ECG Since prior ECG, rhythm is consistently paced Confirmed by Gareth Morgan [phone]) on 07/24/2017 11:21:51 AM Also confirmed by Gareth Morgan [phone])  on 07/24/2017 2:11:34 PM       Radiology Dg Chest 2 View  Result Date: 07/24/2017 CLINICAL DATA:  Midsternal chest pain extending into the right breast for 1 day. History of congestive heart failure and hypertension. EXAM: CHEST  2 VIEW COMPARISON:  07/19/2017 and 06/09/2017 radiographs. FINDINGS: Overall improved pulmonary aeration. Right subclavian pacemaker leads appear unchanged within the right atrium and right ventricle. The heart size is normal. There is aortic atherosclerosis. There is interval improved aeration of the lung bases. Focal density projecting over the left perihilar region on the frontal examination has no definite correlative finding on the lateral view. This could be related to a rib fracture or parenchymal inflammation and appears similar to the most recent study, although is new from 6 weeks ago. No definite acute osseous findings. Postsurgical changes noted in the right humeral head. There is a thoracolumbar scoliosis. IMPRESSION: No definite acute findings. Left perihilar density is only seen on the frontal examination and may be related to healing rib fracture or underlying  parenchymal inflammation/contusion. Electronically Signed   By: Richardean Sale M.D.   On: 07/24/2017 12:10    Procedures Procedures (including critical care time)  Medications Ordered in ED Medications  0.9 %  sodium chloride  infusion ( Intravenous New Bag/Given 07/24/17 2125)  amiodarone (PACERONE) tablet 200 mg (200 mg Oral Given 07/24/17 1513)  apixaban (ELIQUIS) tablet 2.5 mg (2.5 mg Oral Given 07/24/17 2122)  levothyroxine (SYNTHROID, LEVOTHROID) tablet 75 mcg (not administered)  simvastatin (ZOCOR) tablet 10 mg (10 mg Oral Given 07/24/17 2122)  citalopram (CELEXA) tablet 10 mg (not administered)  baclofen (LIORESAL) tablet 10 mg (not administered)  pantoprazole (PROTONIX) EC tablet 40 mg (not administered)  acetaminophen (TYLENOL) tablet 650 mg (650 mg Oral Given 07/24/17 2122)  ondansetron (ZOFRAN) injection 4 mg (not administered)  nitroGLYCERIN (NITROSTAT) SL tablet 0.4 mg (not administered)  gi cocktail (Maalox,Lidocaine,Donnatal) (30 mLs Oral Given 07/24/17 1512)  sodium chloride 0.9 % bolus 500 mL (0 mLs Intravenous Stopped 07/24/17 1750)     Initial Impression / Assessment and Plan / ED Course  I have reviewed the triage vital signs and the nursing notes.  Pertinent labs & imaging results that were available during my care of the patient were reviewed by me and considered in my medical decision making (see chart for details).    83 yo female with a history of atrial fibrillation, hypertension, hyperlipidemia, monoclonalal gammopathy, presents with concern for chest pain. EKG without acute ischemic findings.  No dyspnea, doubt PE.  Good pulses bilaterally, CP free at this time, XR without acute findings, doubt dissection, pneumonia.  Troponin negative. Received ASA.  Patient was seen 7/27 and 9/5 with chest pain and discharged after negative troponins, however given presenting again with escalating chest pain and chest pain present intermittently in the ED will admit for continued care.   Final Clinical Impressions(s) / ED Diagnoses   Final diagnoses:  Chest pain, unspecified type    New Prescriptions Current Discharge Medication List       Gareth Morgan, MD 07/24/17 2209

## 2017-07-24 NOTE — ED Notes (Signed)
Dr. Marily Memos aware of pt BP

## 2017-07-25 ENCOUNTER — Telehealth: Payer: Self-pay | Admitting: Cardiology

## 2017-07-25 ENCOUNTER — Other Ambulatory Visit (HOSPITAL_COMMUNITY): Payer: Medicare Other

## 2017-07-25 ENCOUNTER — Encounter: Payer: Medicare Other | Admitting: *Deleted

## 2017-07-25 ENCOUNTER — Observation Stay (HOSPITAL_COMMUNITY): Payer: Medicare Other

## 2017-07-25 DIAGNOSIS — I071 Rheumatic tricuspid insufficiency: Secondary | ICD-10-CM

## 2017-07-25 DIAGNOSIS — R079 Chest pain, unspecified: Secondary | ICD-10-CM | POA: Diagnosis not present

## 2017-07-25 DIAGNOSIS — I361 Nonrheumatic tricuspid (valve) insufficiency: Secondary | ICD-10-CM | POA: Diagnosis not present

## 2017-07-25 DIAGNOSIS — E039 Hypothyroidism, unspecified: Secondary | ICD-10-CM | POA: Diagnosis not present

## 2017-07-25 DIAGNOSIS — Z950 Presence of cardiac pacemaker: Secondary | ICD-10-CM

## 2017-07-25 DIAGNOSIS — I482 Chronic atrial fibrillation: Secondary | ICD-10-CM | POA: Diagnosis not present

## 2017-07-25 DIAGNOSIS — N184 Chronic kidney disease, stage 4 (severe): Secondary | ICD-10-CM | POA: Diagnosis not present

## 2017-07-25 DIAGNOSIS — I340 Nonrheumatic mitral (valve) insufficiency: Secondary | ICD-10-CM | POA: Diagnosis not present

## 2017-07-25 DIAGNOSIS — I5042 Chronic combined systolic (congestive) and diastolic (congestive) heart failure: Secondary | ICD-10-CM | POA: Diagnosis not present

## 2017-07-25 DIAGNOSIS — R1013 Epigastric pain: Secondary | ICD-10-CM | POA: Diagnosis not present

## 2017-07-25 DIAGNOSIS — I2510 Atherosclerotic heart disease of native coronary artery without angina pectoris: Secondary | ICD-10-CM

## 2017-07-25 DIAGNOSIS — E785 Hyperlipidemia, unspecified: Secondary | ICD-10-CM | POA: Diagnosis not present

## 2017-07-25 DIAGNOSIS — F0390 Unspecified dementia without behavioral disturbance: Secondary | ICD-10-CM | POA: Diagnosis not present

## 2017-07-25 DIAGNOSIS — I272 Pulmonary hypertension, unspecified: Secondary | ICD-10-CM | POA: Diagnosis not present

## 2017-07-25 LAB — TSH: TSH: 1.831 u[IU]/mL (ref 0.350–4.500)

## 2017-07-25 LAB — TROPONIN I: TROPONIN I: 0.03 ng/mL — AB (ref <0.03)

## 2017-07-25 MED ORDER — MECLIZINE HCL 25 MG PO TABS
12.5000 mg | ORAL_TABLET | Freq: Two times a day (BID) | ORAL | Status: DC | PRN
Start: 2017-07-25 — End: 2017-07-27

## 2017-07-25 MED ORDER — GI COCKTAIL ~~LOC~~
30.0000 mL | Freq: Two times a day (BID) | ORAL | Status: DC | PRN
  Administered 2017-07-25: 30 mL via ORAL
  Filled 2017-07-25: qty 30

## 2017-07-25 MED ORDER — PANTOPRAZOLE SODIUM 40 MG PO TBEC
40.0000 mg | DELAYED_RELEASE_TABLET | Freq: Two times a day (BID) | ORAL | Status: DC
  Administered 2017-07-25 – 2017-07-26 (×3): 40 mg via ORAL
  Filled 2017-07-25 (×3): qty 1

## 2017-07-25 MED ORDER — BACID PO TABS
2.0000 | ORAL_TABLET | Freq: Three times a day (TID) | ORAL | Status: DC
  Administered 2017-07-25 – 2017-07-26 (×4): 2 via ORAL
  Filled 2017-07-25 (×5): qty 2

## 2017-07-25 NOTE — Care Management Obs Status (Signed)
[facility] NOTIFICATION   Patient Details  Name: [name] MRN: [id number] Date of Birth: [birth year]   Medicare Observation Status Notification Given:  Yes    Bethena Roys, RN 07/25/2017, 11:57 AM

## 2017-07-25 NOTE — Telephone Encounter (Signed)
Attempted to confirm remote transmission with pt. No answer and was unable to leave a message. Automated message stating that the number is not a working number.

## 2017-07-25 NOTE — Progress Notes (Signed)
Patient's BP= 84/48 manually.  Patient is asymptomatic.  Dr Eliseo Squires notified via page.

## 2017-07-25 NOTE — Care Management Note (Signed)
Case Management Note  Patient Details  Name: [name] MRN: [id number] Date of Birth: 1925/08/23  Subjective/Objective: Pt presented for Chest Pain. Pt is from [place]- plan to return once stable. CSW is aware and working with the patient.                    Action/Plan: CM will continue to monitor.   Expected Discharge Date:                  Expected Discharge Plan:  Assisted Living / Rest Home  In-House Referral:  Clinical Social Work  Discharge planning Services  CM Consult  Post Acute Care Choice:  NA Choice offered to:  NA  DME Arranged:  N/A DME Agency:  NA  HH Arranged:  NA HH Agency:  NA  Status of Service:  Completed, signed off  If discussed at [place] of Stay Meetings, dates discussed:    Additional Comments:  Bethena Roys, RN 07/25/2017, 1:47 PM

## 2017-07-25 NOTE — Consult Note (Signed)
Cardiology Consultation:   Patient ID: [name]; [id number]; Aug 26, 1925   Admit date: 07/24/2017 Date of Consult: 07/25/2017  Primary Care Provider: Burnis Medin, MD Primary Cardiologist: Dr. Haroldine Dickson Primary Electrophysiologist:  Dr. Lovena Dickson   Patient Profile:   [name] is a 83 y.o. female with a hx of diffuse nonobstructive CAD per cath 2015, atrial fibrillation on amiodarone and eliquis 2.5 mg BID, chronic systolic and diastolic heart failure with pulmonary hypertension, s/p St Jude PPM, HTN, HLD, MGUS, thrombocytopenia, and CKD stage IV who is being seen today for the evaluation of chest pain at the request of Dr. Eliseo Dickson.  History of Present Illness:   [name] had a heart catheterization in 01/2014 that showed nonobstructive CAD. She was admitted twice in 04/2014 with heart failure exacerbation. She also underwent DCCV 04/2014 with conversion to sinus but with profound bradycardia resulting in a PPM implantation. This was complicated by anemia requiring blood transfusion. Right heart cath in 09/2014 and was admitted for IV diuresis. NM stress test 03/31/15 was low risk without reversible ischemia and normal LVEF.   She frequently has chest pain and extensive evaluation has not really identified the reason. She has had multiple admissions within the last 2-3 years, often with atypical chest pain, most recently in April 2018. The chest discomfort seemed to resolve with diuresis.  Seen April 14, 2017 in CHF clinic and was doing well. Optimal wight around 125 lb. her weight is actually less than that on admission (121 on admission), now almost 123 pounds after some hydration for "elevated creatinine".  Her last pacemaker download shows that she has been in uninterrupted persistent atrial fibrillation since December 2017 with 92% ventricular paced rhythm. High ventricular rate is not a problem. It appears that amiodarone is being used for rate control, since she has a  tendency to hypotension and is actually receiving Midodrine.  She describes her chest discomfort as being in the lower retrosternal area and actually mostly points to her epigastrium. She is immediately tender to palpation in this area.   Past Medical History:  Diagnosis Date  . Anemia   . Atrial fibrillation ([place])    a. Dx 01/2014->Eliquis started.  . Cancer ([place])   . Chest pain    a. 2011 Neg MV;  b. 01/2014 Cath: LM 20-30, LAD nl, D1 nl, LCX nl, OM1 nl, RCA dom 30-43m, PD/PL nl, EF 65%->Med Rx; c. 03/2016 MV: no ischemia/infarct. EF 63%.  . Degenerative joint disease   . Diastolic CHF, acute on chronic ([place]) 01/31/2012  . Dyslipidemia   . Dysrhythmia   . Family history of adverse reaction to anesthesia    " multiple family members have difficulty waking "  . GERD (gastroesophageal reflux disease)   . History of skin cancer    tafeen/ non melanoma.   Marland Kitchen Hx of varicella   . Hypertension   . Hypothyroidism   . Monoclonal gammopathies   . Pacemaker   . Palpitations    PVCs/bigeminy on event in May 2009 revealing this was relatively asymptomatic  . Right lower quadrant abdominal pain 07/19/2012   Recurrent  With nausea    This is new since she had ct for llq pain in [facility]    R/o appendiceal problem  Hernia  Get ct scan  And plan  Fu     . Thrombocytopenia ([place])     Past Surgical History:  Procedure Laterality Date  . CARDIOVERSION N/A 02/26/2014   Procedure: CARDIOVERSION AT BEDSIDE;  Surgeon: Pixie Casino, MD;  Location: [place];  Service: Cardiovascular;  Laterality: N/A;  . CARDIOVERSION N/A 04/22/2014   Procedure: CARDIOVERSION (BEDSIDE);  Surgeon: [name], MD;  Location: [facility] OR;  Service: Cardiovascular;  Laterality: N/A;  . CATARACT EXTRACTION     Bilateral  implantt  . CESAREAN SECTION     times 2  . CHOLECYSTECTOMY  1999  . ESOPHAGOGASTRODUODENOSCOPY N/A 07/06/2014   Procedure: ESOPHAGOGASTRODUODENOSCOPY (EGD);  Surgeon: Ladene Artist, MD;  Location[facility] ENDOSCOPY;   Service: Endoscopy;  Laterality: N/A;  . INSERT / REPLACE / REMOVE PACEMAKER    . LEFT HEART CATHETERIZATION WITH CORONARY ANGIOGRAM N/A 01/29/2014   Procedure: LEFT HEART CATHETERIZATION WITH CORONARY ANGIOGRAM;  Surgeon: Blane Ohara, MD;  Location: [facility] CATH LAB;  Service: Cardiovascular;  Laterality: N/A;  . PACEMAKER INSERTION  04/23/2014   STJ Assurity dual chamber pacemaker implanted by Dr Rayann Heman  . PERMANENT PACEMAKER INSERTION N/A 04/23/2014   Procedure: PERMANENT PACEMAKER INSERTION;  Surgeon: Evans Lance, MD;  Location: [facility] CATH LAB;  Service: Cardiovascular;  Laterality: N/A;  . RIGHT HEART CATHETERIZATION N/A 09/22/2014   Procedure: RIGHT HEART CATH;  Surgeon: Jolaine Artist, MD;  Location: [facility] CATH LAB;  Service: Cardiovascular;  Laterality: N/A;  . SHOULDER SURGERY  1996   Right      Home Medications:  Prior to Admission medications   Medication Sig Start Date End Date Taking? Authorizing Provider  acetaminophen (TYLENOL) 325 MG tablet Take 650 mg by mouth every 4 (four) hours as needed (for pain).    Yes [provider]  amiodarone (PACERONE) 200 MG tablet Take 1 tablet (200 mg total) by mouth daily. 11/25/16  Yes Bensimhon, Shaune Pascal, MD  baclofen (LIORESAL) 10 MG tablet Take 10 mg by mouth every 12 (twelve) hours as needed for muscle spasms.    Yes [provider]  cephALEXin (KEFLEX) 500 MG capsule Take 1 capsule (500 mg total) by mouth 3 (three) times daily. 07/19/17 07/26/17 Yes Gareth Morgan, MD  citalopram (CELEXA) 10 MG tablet Take 10 mg by mouth daily.   Yes [provider]  ELIQUIS 2.5 MG TABS tablet TAKE ONE TABLET BY MOUTH TWICE DAILY Patient taking differently: Take 2.5 mg by mouth two times a day 05/22/17  Yes Bensimhon, Shaune Pascal, MD  Ferrous Sulfate (IRON) 325 (65 Fe) MG TABS Take 1 tablet by mouth daily. Patient taking differently: Take 325 mg by mouth daily.  07/01/16  Yes Johnson, Clanford L, MD  levothyroxine (SYNTHROID,  LEVOTHROID) 75 MCG tablet TAKE 1 TABLET BY MOUTH ONCE DAILY Patient taking differently: Take 75 mcg by mouth once a day 05/22/17  Yes Panosh, Standley Brooking, MD  loratadine (CLARITIN) 10 MG tablet Take 1 tablet (10 mg total) by mouth daily. 03/10/17  Yes Jani Gravel, MD  magnesium hydroxide (MILK OF MAGNESIA) 400 MG/5ML suspension Take 30 mLs by mouth daily as needed for mild constipation.   Yes [provider]  meclizine (ANTIVERT) 12.5 MG tablet Take 1 tablet (12.5 mg total) by mouth 2 (two) times daily as needed for dizziness. 09/14/16  Yes Bensimhon, Shaune Pascal, MD  metolazone (ZAROXOLYN) 2.5 MG tablet Take 1 tablet (2.5 mg total) by mouth as needed (for weight 134 lb or greater). Patient taking differently: Take 2.5 mg by mouth daily as needed (if weight is 134 pounds or greater).  03/09/16  Yes Bensimhon, Shaune Pascal, MD  nitroGLYCERIN (NITROSTAT) 0.4 MG SL tablet Place 1 tablet (0.4 mg total)  under the tongue every 5 (five) minutes as needed. For chest pain Patient taking differently: Place 0.4 mg under the tongue every 5 (five) minutes as needed for chest pain.  05/06/16  Yes Bensimhon, Shaune Pascal, MD  pantoprazole (PROTONIX) 40 MG tablet Take 40 mg by mouth daily.   Yes [provider]  polyethylene glycol (MIRALAX) packet Take 17 g by mouth daily. Mix with 8 ounces of liquid. 03/11/17  Yes Quintella Reichert, MD  potassium chloride SA (K-DUR,KLOR-CON) 20 MEQ tablet Take 1 tablet (20 mEq total) by mouth daily. Take an additional 51meq with Metolazone. Patient taking differently: Take 20 mEq by mouth daily.  06/09/17  Yes Pattricia Boss, MD  senna (SENOKOT) 8.6 MG TABS tablet Take 2 tablets (17.2 mg total) by mouth at bedtime. 11/13/16  Yes Patrecia Pour, Christean Grief, MD  simvastatin (ZOCOR) 10 MG tablet TAKE ONE TABLET BY MOUTH AT BEDTIME Patient taking differently: Take 10 mg by mouth at bedtime 02/14/17  Yes Larey Dresser, MD  torsemide (DEMADEX) 20 MG tablet Take 1 tablet (20 mg total) by mouth daily.  Please start on 11/22/2016 Patient taking differently: Take 20 mg by mouth daily.  06/09/17  Yes Pattricia Boss, MD  Zinc Oxide 6 % CREA Apply 1 application topically daily. APPLY TO BUTTICKS   Yes [provider]  denosumab (PROLIA) 60 MG/ML SOLN injection Inject 60 mg into the skin every 6 (six) months. Administer in upper arm, thigh, or abdomen    [provider]    Inpatient Medications: Scheduled Meds: . amiodarone  200 mg Oral Daily  . apixaban  2.5 mg Oral BID  . citalopram  10 mg Oral Daily  . levothyroxine  75 mcg Oral QAC breakfast  . pantoprazole  40 mg Oral Daily  . simvastatin  10 mg Oral QHS   Continuous Infusions: . sodium chloride 100 mL/hr at 07/25/17 0634   PRN Meds: acetaminophen, baclofen, nitroGLYCERIN, ondansetron (ZOFRAN) IV  Allergies:    Allergies  Allergen Reactions  . Other Shortness Of Breath    Detergents, perfumes and other strong, odor-emitting compounds  . Tramadol Shortness Of Breath and Nausea Only  . Ibuprofen Other (See Comments)    Listed as an allergy on MAR    Social History:   Social History   Social History  . Marital status: Widowed    Spouse name: N/A  . Number of children: 3  . Years of education: N/A   Occupational History  .  Retired    [employer], book keeping   Social History Main Topics  . Smoking status: Never Smoker  . Smokeless tobacco: Never Used  . Alcohol use No  . Drug use: No  . Sexual activity: No   Other Topics Concern  . 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 Eats with her.      Daughter Joseph Art handles medications. Sees HH and PT once a week.   Helper  [place]  there Friday 1-3 pm   Pt lives alone, Joseph Art lives 30 min away.  05-15-17    Family History:     Family History  Problem Relation Age of Onset  .  Coronary artery disease Father   . Heart disease Father   . Heart attack Father   . Alzheimer's disease Mother   . Lung cancer Brother 86  . Lung cancer Unknown      ROS:  Please see the history of present illness.  ROS  All other ROS reviewed and negative.     Physical Exam/Data:   Vitals:   07/24/17 1923 07/24/17 1930 07/24/17 2010 07/25/17 0521  BP: (!) 93/57 (!) 94/58 (!) 102/54 101/64  Pulse: 72 70 70 71  Resp: 20 20  18   Temp:   97.9 F (36.6 C) (!) 97.5 F (36.4 C)  TempSrc:   Oral Axillary  SpO2: 97% 97% 99% 93%  Weight:   121 lb 12.8 oz (55.2 kg) 122 lb 14.4 oz (55.7 kg)  Height:   4\' 10"  (1.473 m)     Intake/Output Summary (Last 24 hours) at 07/25/17 0900 Last data filed at 07/25/17 0700  Gross per 24 hour  Intake          1950.33 ml  Output              850 ml  Net          1100.33 ml   Filed Weights   07/24/17 2010 07/25/17 0521  Weight: 121 lb 12.8 oz (55.2 kg) 122 lb 14.4 oz (55.7 kg)   Body mass index is 25.69 kg/m.  General:  Slender, well developed, in no acute distress HEENT: normal Lymph: no adenopathy Neck:prominent V waves to the angle of the jaw, especially visible in the left jugular, average JVP roughly 8-9 centimeters above sternal angle  Endocrine:  No thryomegaly Vascular: No carotid bruits; FA pulses 2+ bilaterally without bruits  Cardiac:  normal S1, S2; RRR;  3/6 holosystolic murmur heard up and down both sides of the sternal border, no diastolic murmur , right subclavian pacemaker site appears healthy, with some collateral vein formation suggesting possible venous occlusion. Lungs:  clear to auscultation bilaterally, no wheezing, rhonchi or rales  Abd: soft, moderately tender to palpation in the epigastrium , no hepatomegaly  Ext: no edema Musculoskeletal:  No deformities, BUE and BLE strength normal and equal Skin: warm and dry  Neuro:  CNs 2-12 intact, no focal abnormalities  noted Psych:  Normal affect   EKG:  The EKG was personally reviewed and demonstrates:   Background atrial fibrillation with 100% ventricular paced rhythm Telemetry:  Telemetry was personally reviewed, shows ventricular paced rhythm  Relevant CV Studies:  Echocardiogram 05/28/17: Study Conclusions - Left ventricle: The cavity size was normal. Wall thickness was   increased in a pattern of mild LVH. Systolic function was mildly   reduced. The estimated ejection fraction was in the range of 45%   to 50%. There is akinesis of the mid-apicalinferoseptal   myocardium. Doppler parameters are consistent with a reversible   restrictive pattern, indicative of decreased left ventricular   diastolic compliance and/or increased left atrial pressure (grade   3 diastolic dysfunction). - Aortic valve: Valve mobility was restricted. There was mild   regurgitation. - Mitral valve: Calcified annulus. Mildly thickened leaflets .   There was mild regurgitation. - Left atrium: The atrium was moderately dilated. - Right ventricle: Pacer wire or catheter noted in right ventricle. - Pulmonary arteries: Systolic pressure was mildly increased. PA   peak pressure: 35 mm Hg (S).  Impressions: - Mitral regurgitation does not appear as severe as previous   echocardiogram demonstrates (personally viewed).  Echocardiogram 03/10/17: Study Conclusions - Left ventricle: The cavity size was normal. Wall thickness was   normal. Systolic function was normal. The estimated ejection   fraction was in the range of 55% to 60%. Doppler parameters are   consistent with a reversible restrictive pattern, indicative of   decreased left ventricular diastolic compliance and/or increased   left atrial pressure (grade 3 diastolic dysfunction). - Aortic valve: Mildly calcified annulus. Moderately thickened,   moderately calcified leaflets. There was mild regurgitation.   Valve area (Vmax): 1.29 cm^2. - Mitral valve: There was  severe regurgitation. - Left atrium: The atrium was massively dilated. - Right atrium: The atrium was moderately dilated. - Tricuspid valve: There was severe regurgitation. - Pulmonary arteries: Systolic pressure was mildly increased. PA   peak pressure: 37 mm Hg (S).  NM stress 03/31/15: IMPRESSION: 1. No scintigraphic evidence of prior infarction or pharmacologically induced ischemia. 2. Normal left ventricular wall motion. 3. Left ventricular ejection fraction 73% 4. Low-risk stress test findings*.   Right heart cath 09/22/14: Findings: RA = 11 RV = 49/6/10 PA = 49/22 (34) PCW = 18 (v = 25) Fick cardiac output/index = 6.4/4.0 PVR = 2.5 WU FA sat = 95% PA sat = 72%,74%  Plan/Discussion: Her PCWP looks pretty good but has significant v-waves in wedge tracing as well as elevated R-sided pressures and significant peripheral edema. Will admit for brief stay and attempt IV diuresis as tolerated. Follow CVPs.   Left heart cath 01/29/14: Final Conclusions:   1. Diffuse nonobstructive coronary artery disease 2. Normal left ventricular systolic function  Recommendations: Medical therapy for nonobstructive CAD.     Laboratory Data:  Chemistry Recent Labs Lab 07/19/17 0225 07/24/17 1132  NA 140 141  K 3.6 4.2  CL 105 105  CO2 27 29  GLUCOSE 103* 115*  BUN 24* 21*  CREATININE 1.76* 1.81*  CALCIUM 9.1 9.2  GFRNONAA 24* 23*  GFRAA 28* 27*  ANIONGAP 8 7     Recent Labs Lab 07/24/17 1132  PROT 6.2*  ALBUMIN 3.1*  AST 23  ALT 16  ALKPHOS 76  BILITOT 0.8   Hematology Recent Labs Lab 07/19/17 0225 07/24/17 1132  WBC 7.3 5.7  RBC 4.02 3.98  HGB 12.4 12.2  HCT 39.1 39.0  MCV 97.3 98.0  MCH 30.8 30.7  MCHC 31.7 31.3  RDW 15.0 14.9  PLT 141* 125*   Cardiac Enzymes Recent Labs Lab 07/24/17 1453 07/24/17 2029 07/25/17 0213  TROPONINI 0.03* 0.03* 0.03*    Recent Labs Lab 07/19/17 0230 07/19/17 0403 07/24/17 1137  TROPIPOC 0.00 0.03 0.01      BNPNo results for input(s): BNP, PROBNP in the last 168 hours.  DDimer No results for input(s): DDIMER in the last 168 hours.  Radiology/Studies:  Dg Chest 2 View  Result Date: 07/24/2017 CLINICAL DATA:  Midsternal chest pain extending into the right breast for 1 day. History of congestive heart failure and hypertension. EXAM: CHEST  2 VIEW COMPARISON:  07/19/2017 and 06/09/2017 radiographs. FINDINGS: Overall improved pulmonary aeration. Right subclavian pacemaker leads appear unchanged within the right atrium and right ventricle. The heart size is normal. There is aortic atherosclerosis. There is interval improved aeration of the lung bases. Focal density projecting over the left perihilar region on the frontal examination has no definite correlative finding on the lateral view. This could be related to a rib fracture or parenchymal inflammation and appears similar to the most recent study, although is new from 6 weeks ago. No  definite acute osseous findings. Postsurgical changes noted in the right humeral head. There is a thoracolumbar scoliosis. IMPRESSION: No definite acute findings. Left perihilar density is only seen on the frontal examination and may be related to healing rib fracture or underlying parenchymal inflammation/contusion. Electronically Signed   By: Richardean Sale M.D.   On: 07/24/2017 12:10    Assessment and Plan:   1. Atypical chest pain: This does not have features suggestive of coronary insufficiency. In fact she mostly seems to have epigastric tenderness. Consider hepatic distention due to right heart failure, although she does not have other overt signs of hypervolemia and she is actually under her usual "dry weight" by a couple of pounds. In the past diuresis has successful lead to alleviation of her complaints, possibly via reduction in the degree of pulmonary hypertension. Consider right upper quadrant ultrasound to evaluate liver size or other sources of epigastric  discomfort 2. Right heart failure: Her murmur is due to tricuspid insufficiency which has been documented as severe in the past. It is not a new finding. She does not appear to be overtly hypervolemic and in the past diuresis has been limited by hypotension. Repeat an echocardiogram to reevaluate pulmonary artery pressure. 3. CAD: This has been relatively mild and recent evaluation has shown no evidence of coronary insufficiency. Her last nuclear stress test was performed roughly one year ago. It showed low risk findings. I don't think she is a great candidate for percutaneous revascularization in view of her age and renal dysfunction, some not sure that repeating the study would serve her.  4. AFib: She has excellent rate control, possibly excessive (92% ventricular pacing), consider reducing the dose of amiodarone. I doubt that repeat cardioversion would lead to lasting success. It would make sense to reprogram her pacemaker VVIR, but will discuss this with Dr. Lovena Dickson. We'll recheck her pacemaker during this admission, since it is just about due. 5. CKD: Creatinine appears to be at baseline. In my evaluation, her baseline creatinine is around 2.0-2.2. I don't think she is "dry", but rather that she has lost some true weight and we need to reevaluate her optimal fluid status. Stop IV fluids.   For questions or updates, please contact [place] Please consult [email or web address] for contact info under Cardiology/STEMI.   Signed, Tami Lin Duke, PA  07/25/2017 9:00 AM

## 2017-07-25 NOTE — Progress Notes (Signed)
Patient was in the room alone. Patient very receptive and appreciative for my visitation. Patient had a lot to share about her three daughters and her life in general. [name] provided reflective listening and compassionate presence.  Chaplain Leen Tworek.

## 2017-07-25 NOTE — Progress Notes (Signed)
PROGRESS NOTE    [name]  KPT:[id number] DOB: [birth year] DOA: 07/24/2017 PCP: Burnis Medin, MD   Outpatient Specialists:     Brief Narrative:  [name] is a 83 y.o. female who presents with hypotension and atypical CP   Assessment & Plan:   Principal Problem:   Chest pain Active Problems:   CAD (coronary artery disease)   Chronic combined systolic and diastolic CHF, NYHA class 2 (HCC)   CKD (chronic kidney disease), stage IV (HCC)   Dementia   Atrial fibrillation, chronic (HCC)   Monoclonal gammopathy of unknown significance (MGUS)   Thrombocytopenia, idiopathic (HCC)   Pulmonary HTN (HCC)   Mild mitral regurgitation   Epigastric pain -started after keflex -PPI GI cocktail lactobacillus -CE stable -seen by cards -In 2015 patient underwent R&L heart cath that revealed increased right side pressures with diffuse nonobstructed CAD with medical therapy recommended by cardiology -echo pending    Chronic combined systolic and diastolic CHF, NYHA class 2 /pulmonary hypertension/mild mitral regurgitation -stable    CKD (chronic kidney disease), stage IV  -Renal function stable and at baseline    Dementia -Resides at nursing facility and daughter is power of attorney    Atrial fibrillation, chronic  -Currently rate controlled on amiodarone with 1% ventricular pacing -Continue eliquis -CHADVASC=6    Monoclonal gammopathy of unknown significance (MGUS)/Thrombocytopenia, idiopathic  -Anemia stable and at baseline with hemoglobin 12.2 -occult blood stool pending       DVT prophylaxis:  Fully anticoagulated   Code Status: DNR   Family Communication: Daughter on phone  Disposition Plan:  Back to ALF once able to eat and pain controlled   Consultants:   cards    Subjective: Epigastric discomfort  Objective: Vitals:   07/24/17 1923 07/24/17 1930 07/24/17 2010 07/25/17 0521  BP: (!) 93/57 (!) 94/58 (!) 102/54 101/64    Pulse: 72 70 70 71  Resp: 20 20  18   Temp:   97.9 F (36.6 C) (!) 97.5 F (36.4 C)  TempSrc:   Oral Axillary  SpO2: 97% 97% 99% 93%  Weight:   55.2 kg (121 lb 12.8 oz) 55.7 kg (122 lb 14.4 oz)  Height:   4\' 10"  (1.473 m)     Intake/Output Summary (Last 24 hours) at 07/25/17 1243 Last data filed at 07/25/17 1100  Gross per 24 hour  Intake          2130.33 ml  Output             1650 ml  Net           480.33 ml   Filed Weights   07/24/17 2010 07/25/17 0521  Weight: 55.2 kg (121 lb 12.8 oz) 55.7 kg (122 lb 14.4 oz)    Examination:  General exam: Appears calm and comfortable  Respiratory system: Clear to auscultation. Respiratory effort normal. Cardiovascular system: S1 & S2 heard, RRR. No JVD, murmurs, rubs, gallops or clicks. No pedal edema. Gastrointestinal system: Abdomen is nondistended, soft and nontender. No organomegaly or masses felt. Normal bowel sounds heard. Central nervous system: Alert Extremities: Symmetric 5 x 5 power. Skin: No rashes, lesions or ulcers Psychiatry: Judgement and insight appear normal. Mood & affect appropriate.     Data Reviewed: I have personally reviewed following labs and imaging studies  CBC:  Recent Labs Lab 07/19/17 0225 07/24/17 1132  WBC 7.3 5.7  NEUTROABS  --  4.4  HGB 12.4 12.2  HCT 39.1 39.0  MCV 97.3 98.0  PLT 141* 258*   Basic Metabolic Panel:  Recent Labs Lab 07/19/17 0225 07/24/17 1132  NA 140 141  K 3.6 4.2  CL 105 105  CO2 27 29  GLUCOSE 103* 115*  BUN 24* 21*  CREATININE 1.76* 1.81*  CALCIUM 9.1 9.2   GFR: Estimated Creatinine Clearance: 15.3 mL/min (A) (by C-G formula based on SCr of 1.81 mg/dL (H)). Liver Function Tests:  Recent Labs Lab 07/24/17 1132  AST 23  ALT 16  ALKPHOS 76  BILITOT 0.8  PROT 6.2*  ALBUMIN 3.1*    Recent Labs Lab 07/24/17 1132  LIPASE 33   No results for input(s): AMMONIA in the last 168 hours. Coagulation Profile: No results for input(s): INR, PROTIME in  the last 168 hours. Cardiac Enzymes:  Recent Labs Lab 07/24/17 1453 07/24/17 2029 07/25/17 0213  TROPONINI 0.03* 0.03* 0.03*   BNP (last 3 results) No results for input(s): PROBNP in the last 8760 hours. HbA1C: No results for input(s): HGBA1C in the last 72 hours. CBG: No results for input(s): GLUCAP in the last 168 hours. Lipid Profile: No results for input(s): CHOL, HDL, LDLCALC, TRIG, CHOLHDL, LDLDIRECT in the last 72 hours. Thyroid Function Tests: No results for input(s): TSH, T4TOTAL, FREET4, T3FREE, THYROIDAB in the last 72 hours. Anemia Panel: No results for input(s): VITAMINB12, FOLATE, FERRITIN, TIBC, IRON, RETICCTPCT in the last 72 hours. Urine analysis:    Component Value Date/Time   COLORURINE STRAW (A) 07/24/2017 1351   APPEARANCEUR CLEAR 07/24/2017 1351   LABSPEC 1.006 07/24/2017 1351   PHURINE 6.0 07/24/2017 1351   GLUCOSEU NEGATIVE 07/24/2017 1351   GLUCOSEU NEGATIVE 12/19/2011 0956   HGBUR NEGATIVE 07/24/2017 1351   BILIRUBINUR NEGATIVE 07/24/2017 1351   BILIRUBINUR neg 02/27/2017 1618   KETONESUR NEGATIVE 07/24/2017 1351   PROTEINUR NEGATIVE 07/24/2017 1351   UROBILINOGEN 0.2 02/27/2017 1618   UROBILINOGEN 0.2 09/10/2015 1150   NITRITE NEGATIVE 07/24/2017 1351   LEUKOCYTESUR NEGATIVE 07/24/2017 1351    ) Recent Results (from the past 240 hour(s))  Urine culture     Status: None (Preliminary result)   Collection Time: 07/24/17  1:51 PM  Result Value Ref Range Status   Specimen Description URINE, CLEAN CATCH  Final   Special Requests NONE  Final   Culture CULTURE REINCUBATED FOR BETTER GROWTH  Final   Report Status PENDING  Incomplete      Anti-infectives    None       Radiology Studies: Dg Chest 2 View  Result Date: 07/24/2017 CLINICAL DATA:  Midsternal chest pain extending into the right breast for 1 day. History of congestive heart failure and hypertension. EXAM: CHEST  2 VIEW COMPARISON:  07/19/2017 and 06/09/2017 radiographs.  FINDINGS: Overall improved pulmonary aeration. Right subclavian pacemaker leads appear unchanged within the right atrium and right ventricle. The heart size is normal. There is aortic atherosclerosis. There is interval improved aeration of the lung bases. Focal density projecting over the left perihilar region on the frontal examination has no definite correlative finding on the lateral view. This could be related to a rib fracture or parenchymal inflammation and appears similar to the most recent study, although is new from 6 weeks ago. No definite acute osseous findings. Postsurgical changes noted in the right humeral head. There is a thoracolumbar scoliosis. IMPRESSION: No definite acute findings. Left perihilar density is only seen on the frontal examination and may be related to healing rib fracture or underlying parenchymal inflammation/contusion. Electronically Signed   By: Richardean Sale  M.D.   On: 07/24/2017 12:10        Scheduled Meds: . amiodarone  200 mg Oral Daily  . apixaban  2.5 mg Oral BID  . citalopram  10 mg Oral Daily  . lactobacillus acidophilus  2 tablet Oral TID  . levothyroxine  75 mcg Oral QAC breakfast  . pantoprazole  40 mg Oral BID  . simvastatin  10 mg Oral QHS   Continuous Infusions:   LOS: 0 days    Time spent: 35 min    [place], DO [facility] Pager [phone]  If 7PM-7AM, please contact night-coverage [email or web address] Password [facility] 07/25/2017, 12:43 PM

## 2017-07-25 NOTE — Clinical Social Work Note (Signed)
Clinical Social Work Assessment  Patient Details  Name: [name] MRN: [id number] Date of Birth: 1925-09-25  Date of referral:  07/25/17               Reason for consult:  Facility Placement (from [facility])                Permission sought to share information with:  Family Supports, Chartered certified accountant granted to share information::  Yes, Verbal Permission Granted  Name::     [place]::  [facility]  Relationship::  daughter  Contact Information:  [phone]  Housing/Transportation Living arrangements for the past 2 months:  [place] of Information:  Patient Patient Interpreter Needed:  None Criminal Activity/Legal Involvement Pertinent to Current Situation/Hospitalization:  No - Comment as needed Significant Relationships:  Adult Children Lives with:  Facility Resident Do you feel safe going back to the place where you live?  Yes Need for family participation in patient care:  No (Coment)  Care giving concerns: Patient from [facility]. Patient in observation status and likely discharge today back to ALF.   Social Worker assessment / plan: CSW met with patient at bedside and spoke to patient's daughter, Joseph Art, via phone. Patient reported she has only been living at [place] [facility] a short time, and before that was in a SNF for rehab. Patient endorses having some falls at home before entering ALF, and indicated otherwise she was mostly independent, with some help from a home health aide. Patient agreeable to return to ALF. Daughter would like patient to return to ALF as well. Patient not assessed by PT at this time. CSW will support with discharge back to ALF.  Employment status:  Retired Copy) PT Recommendations:  Not assessed at this time Information / Referral to community resources:  Other (Comment  Required) (back to ALF)  Patient/Family's Response to care: Patient appreciative of care.  Patient/Family's Understanding of and Emotional Response to Diagnosis, Current Treatment, and Prognosis:  Patient articulated good understanding of her medical conditions and is hopeful for return to ALF.  Emotional Assessment Appearance:  Appears stated age Attitude/Demeanor/Rapport:  Other (appropriate) Affect (typically observed):  Calm, Pleasant Orientation:  Oriented to Self, Oriented to Place, Oriented to  Time, Oriented to Situation Alcohol / Substance use:  Not Applicable Psych involvement (Current and /or in the community):  No (Comment)  Discharge Needs  Concerns to be addressed:  Discharge Planning Concerns Readmission within the last 30 days:  No Current discharge risk:  Physical Impairment Barriers to Discharge:  Continued Medical Work up   Estanislado Emms, LCSW 07/25/2017, 2:23 PM

## 2017-07-25 NOTE — Progress Notes (Signed)
Patient complaining of chest pain 9/10.  4 L O2 [place] applied BP= 105/56.  EKG being obtained.

## 2017-07-26 ENCOUNTER — Observation Stay (HOSPITAL_BASED_OUTPATIENT_CLINIC_OR_DEPARTMENT_OTHER): Payer: Medicare Other

## 2017-07-26 ENCOUNTER — Encounter (HOSPITAL_COMMUNITY): Payer: Self-pay | Admitting: General Practice

## 2017-07-26 DIAGNOSIS — N184 Chronic kidney disease, stage 4 (severe): Secondary | ICD-10-CM | POA: Diagnosis not present

## 2017-07-26 DIAGNOSIS — I2510 Atherosclerotic heart disease of native coronary artery without angina pectoris: Secondary | ICD-10-CM | POA: Diagnosis not present

## 2017-07-26 DIAGNOSIS — I482 Chronic atrial fibrillation: Secondary | ICD-10-CM | POA: Diagnosis not present

## 2017-07-26 DIAGNOSIS — I351 Nonrheumatic aortic (valve) insufficiency: Secondary | ICD-10-CM | POA: Diagnosis not present

## 2017-07-26 DIAGNOSIS — I340 Nonrheumatic mitral (valve) insufficiency: Secondary | ICD-10-CM

## 2017-07-26 DIAGNOSIS — F0390 Unspecified dementia without behavioral disturbance: Secondary | ICD-10-CM | POA: Diagnosis not present

## 2017-07-26 DIAGNOSIS — I5042 Chronic combined systolic (congestive) and diastolic (congestive) heart failure: Secondary | ICD-10-CM | POA: Diagnosis not present

## 2017-07-26 DIAGNOSIS — R079 Chest pain, unspecified: Secondary | ICD-10-CM | POA: Diagnosis not present

## 2017-07-26 DIAGNOSIS — I361 Nonrheumatic tricuspid (valve) insufficiency: Secondary | ICD-10-CM

## 2017-07-26 LAB — CBC
HCT: 39.9 % (ref 36.0–46.0)
Hemoglobin: 12.4 g/dL (ref 12.0–15.0)
MCH: 30.7 pg (ref 26.0–34.0)
MCHC: 31.1 g/dL (ref 30.0–36.0)
MCV: 98.8 fL (ref 78.0–100.0)
Platelets: 106 K/uL — ABNORMAL LOW (ref 150–400)
RBC: 4.04 MIL/uL (ref 3.87–5.11)
RDW: 15.3 % (ref 11.5–15.5)
WBC: 5.6 K/uL (ref 4.0–10.5)

## 2017-07-26 LAB — URINE CULTURE: Culture: 20000 — AB

## 2017-07-26 LAB — BASIC METABOLIC PANEL
ANION GAP: 4 — AB (ref 5–15)
BUN: 18 mg/dL (ref 6–20)
CALCIUM: 9.4 mg/dL (ref 8.9–10.3)
CO2: 27 mmol/L (ref 22–32)
Chloride: 108 mmol/L (ref 101–111)
Creatinine, Ser: 1.69 mg/dL — ABNORMAL HIGH (ref 0.44–1.00)
GFR, EST AFRICAN AMERICAN: 30 mL/min — AB (ref >60)
GFR, EST NON AFRICAN AMERICAN: 26 mL/min — AB (ref >60)
GLUCOSE: 90 mg/dL (ref 65–99)
POTASSIUM: 4.5 mmol/L (ref 3.5–5.1)
SODIUM: 139 mmol/L (ref 135–145)

## 2017-07-26 LAB — ECHOCARDIOGRAM COMPLETE
Height: 58 in
Weight: 1940.8 oz

## 2017-07-26 MED ORDER — BISACODYL 10 MG RE SUPP: 10.0000 mg | Freq: Every day | RECTAL | 0 refills | Status: DC | PRN

## 2017-07-26 MED ORDER — BACID PO TABS: 2.0000 | ORAL_TABLET | Freq: Three times a day (TID) | ORAL | 0 refills | Status: DC

## 2017-07-26 MED ORDER — LIDOCAINE 5 % EX PTCH: 2.0000 | MEDICATED_PATCH | CUTANEOUS | 0 refills | Status: DC

## 2017-07-26 MED ORDER — LIDOCAINE 5 % EX PTCH
2.0000 | MEDICATED_PATCH | CUTANEOUS | Status: DC
  Administered 2017-07-26: 2 via TRANSDERMAL
  Filled 2017-07-26: qty 2

## 2017-07-26 MED ORDER — FUROSEMIDE 10 MG/ML IJ SOLN
20.0000 mg | Freq: Once | INTRAMUSCULAR | Status: AC
  Administered 2017-07-26: 20 mg via INTRAVENOUS
  Filled 2017-07-26: qty 2

## 2017-07-26 MED ORDER — PANTOPRAZOLE SODIUM 40 MG PO TBEC: 40.0000 mg | DELAYED_RELEASE_TABLET | Freq: Two times a day (BID) | ORAL | 0 refills | Status: DC

## 2017-07-26 MED ORDER — SUCRALFATE 1 GM/10ML PO SUSP
1.0000 g | Freq: Three times a day (TID) | ORAL | Status: DC
  Administered 2017-07-26 (×2): 1 g via ORAL
  Filled 2017-07-26 (×2): qty 10

## 2017-07-26 MED ORDER — BISACODYL 10 MG RE SUPP
10.0000 mg | Freq: Every day | RECTAL | Status: DC | PRN
  Filled 2017-07-26: qty 1

## 2017-07-26 MED ORDER — POLYETHYLENE GLYCOL 3350 17 G PO PACK
17.0000 g | PACK | Freq: Every day | ORAL | Status: DC
  Administered 2017-07-26: 17 g via ORAL
  Filled 2017-07-26: qty 1

## 2017-07-26 NOTE — Progress Notes (Signed)
[facility] called for report and spoke with [name] .

## 2017-07-26 NOTE — Progress Notes (Signed)
Patient will discharge back to [facility]. Anticipated discharge date: 07/26/17 Family notified: Oren Binet, daughter Transportation by: PTAR  Nurse to call report to [phone].   CSW signing off.  Estanislado Emms, [place]  Clinical Social Worker

## 2017-07-26 NOTE — Progress Notes (Signed)
Progress Note  Patient Name: [name] Date of Encounter: 07/26/2017  Primary Cardiologist: Dr Haroldine Laws Electrophysiologist: Dr. Lovena Le  Subjective   The patient is having no chest pain, shortness of breath or orthopnea. She is having right shoulder pain mostly last night when trying to go to sleep. No reproducible. Also having right lower abominal pain and constipation.   Inpatient Medications    Scheduled Meds: . amiodarone  200 mg Oral Daily  . apixaban  2.5 mg Oral BID  . citalopram  10 mg Oral Daily  . lactobacillus acidophilus  2 tablet Oral TID  . levothyroxine  75 mcg Oral QAC breakfast  . lidocaine  2 patch Transdermal Q24H  . pantoprazole  40 mg Oral BID  . polyethylene glycol  17 g Oral Daily  . simvastatin  10 mg Oral QHS  . sucralfate  1 g Oral TID WC & HS   Continuous Infusions:  PRN Meds: acetaminophen, baclofen, bisacodyl, gi cocktail, meclizine, ondansetron (ZOFRAN) IV   Vital Signs    Vitals:   07/25/17 1545 07/25/17 1554 07/25/17 2021 07/26/17 0500  BP: (!) 95/52 (!) 84/48 102/74 100/60  Pulse:   73 68  Resp: 18  18 18   Temp: 97.6 F (36.4 C)  98 F (36.7 C) 97.9 F (36.6 C)  TempSrc: Oral  Oral Oral  SpO2: 91%  92% 93%  Weight:    121 lb 4.8 oz (55 kg)  Height:        Intake/Output Summary (Last 24 hours) at 07/26/17 1115 Last data filed at 07/25/17 2022  Gross per 24 hour  Intake              240 ml  Output              900 ml  Net             -660 ml   Filed Weights   07/24/17 2010 07/25/17 0521 07/26/17 0500  Weight: 121 lb 12.8 oz (55.2 kg) 122 lb 14.4 oz (55.7 kg) 121 lb 4.8 oz (55 kg)    Telemetry    Atrial fibrillation with frequent V pacing in the 60's-70's - Personally Reviewed  ECG    No new tracing - Personally Reviewed  Physical Exam   GEN: Elderly female, No acute distress.   Neck: No JVD Cardiac: Irregularly irregular rhythm, 2/6 systolic murmur LUSB Respiratory: Clear to auscultation bilaterally  except few crackles in right base.  GI: Soft, nontender, non-distended  MS: No edema; No deformity. Neuro:  Nonfocal  Psych: Normal affect   Labs    Chemistry Recent Labs Lab 07/24/17 1132 07/26/17 0404  NA 141 139  K 4.2 4.5  CL 105 108  CO2 29 27  GLUCOSE 115* 90  BUN 21* 18  CREATININE 1.81* 1.69*  CALCIUM 9.2 9.4  PROT 6.2*  --   ALBUMIN 3.1*  --   AST 23  --   ALT 16  --   ALKPHOS 76  --   BILITOT 0.8  --   GFRNONAA 23* 26*  GFRAA 27* 30*  ANIONGAP 7 4*     Hematology Recent Labs Lab 07/24/17 1132 07/26/17 0404  WBC 5.7 5.6  RBC 3.98 4.04  HGB 12.2 12.4  HCT 39.0 39.9  MCV 98.0 98.8  MCH 30.7 30.7  MCHC 31.3 31.1  RDW 14.9 15.3  PLT 125* 106*    Cardiac Enzymes Recent Labs Lab 07/24/17 1453 07/24/17 2029 07/25/17 0213  TROPONINI  0.03* 0.03* 0.03*    Recent Labs Lab 07/24/17 1137  TROPIPOC 0.01     BNPNo results for input(s): BNP, PROBNP in the last 168 hours.   DDimer No results for input(s): DDIMER in the last 168 hours.   Radiology    Dg Chest 2 View  Result Date: 07/24/2017 CLINICAL DATA:  Midsternal chest pain extending into the right breast for 1 day. History of congestive heart failure and hypertension. EXAM: CHEST  2 VIEW COMPARISON:  07/19/2017 and 06/09/2017 radiographs. FINDINGS: Overall improved pulmonary aeration. Right subclavian pacemaker leads appear unchanged within the right atrium and right ventricle. The heart size is normal. There is aortic atherosclerosis. There is interval improved aeration of the lung bases. Focal density projecting over the left perihilar region on the frontal examination has no definite correlative finding on the lateral view. This could be related to a rib fracture or parenchymal inflammation and appears similar to the most recent study, although is new from 6 weeks ago. No definite acute osseous findings. Postsurgical changes noted in the right humeral head. There is a thoracolumbar scoliosis.  IMPRESSION: No definite acute findings. Left perihilar density is only seen on the frontal examination and may be related to healing rib fracture or underlying parenchymal inflammation/contusion. Electronically Signed   By: Richardean Sale M.D.   On: 07/24/2017 12:10    Cardiac Studies   Echocardiogram 05/28/17: Study Conclusions - Left ventricle: The cavity size was normal. Wall thickness was increased in a pattern of mild LVH. Systolic function was mildly reduced. The estimated ejection fraction was in the range of 45% to 50%. There is akinesis of the mid-apicalinferoseptal myocardium. Doppler parameters are consistent with a reversible restrictive pattern, indicative of decreased left ventricular diastolic compliance and/or increased left atrial pressure (grade 3 diastolic dysfunction). - Aortic valve: Valve mobility was restricted. There was mild regurgitation. - Mitral valve: Calcified annulus. Mildly thickened leaflets . There was mild regurgitation. - Left atrium: The atrium was moderately dilated. - Right ventricle: Pacer wire or catheter noted in right ventricle. - Pulmonary arteries: Systolic pressure was mildly increased. PA peak pressure: 35 mm Hg (S).  Impressions: - Mitral regurgitation does not appear as severe as previous echocardiogram demonstrates (personally viewed).   Echocardiogram 03/10/17: Study Conclusions - Left ventricle: The cavity size was normal. Wall thickness was normal. Systolic function was normal. The estimated ejection fraction was in the range of 55% to 60%. Doppler parameters are consistent with a reversible restrictive pattern, indicative of decreased left ventricular diastolic compliance and/or increased left atrial pressure (grade 3 diastolic dysfunction). - Aortic valve: Mildly calcified annulus. Moderately thickened, moderately calcified leaflets. There was mild regurgitation. Valve area (Vmax): 1.29  cm^2. - Mitral valve: There was severe regurgitation. - Left atrium: The atrium was massively dilated. - Right atrium: The atrium was moderately dilated. - Tricuspid valve: There was severe regurgitation. - Pulmonary arteries: Systolic pressure was mildly increased. PA peak pressure: 37 mm Hg (S).  NM stress 03/31/15: IMPRESSION: 1. No scintigraphic evidence of prior infarction or pharmacologically induced ischemia. 2. Normal left ventricular wall motion. 3. Left ventricular ejection fraction 73% 4. Low-risk stress test findings*.   Right heart cath 09/22/14: Findings: RA = 11 RV = 49/6/10 PA = 49/22 (34) PCW = 18 (v = 25) Fick cardiac output/index = 6.4/4.0 PVR = 2.5 WU FA sat = 95% PA sat = 72%,74%  Plan/Discussion: Her PCWP looks pretty good but has significant v-waves in wedge tracing as well as elevated R-sided  pressures and significant peripheral edema. Will admit for brief stay and attempt IV diuresis as tolerated. Follow CVPs.   Left heart cath 01/29/14: Final Conclusions:  1. Diffuse nonobstructive coronary artery disease 2. Normal left ventricular systolic function  Recommendations: Medical therapy for nonobstructive CAD.    Patient Profile     83 y.o. female with a hx of diffuse nonobstructive CAD per cath 2015, atrial fibrillation on amiodarone and eliquis 2.5 mg BID, chronic systolic and diastolic heart failure with pulmonary hypertension, s/p St Jude PPM, HTN, HLD, MGUS, thrombocytopenia, and CKD stage IV who is being seen for the evaluation of chest pain.  Assessment & Plan    1. Atypical chest pain -Features not suggestive of coronary insufficiency. Mostly has epigastric tenderness and started after Keflex. PPI has been started. Consider hepatic distention due to right heart failure, although she does not have other overt signs of hypervolemia. -Has had frequent chest pain and extensive evaluation has not really identify the reason. The chest  pain usually improves with diuresis. She is followed in the CHF clinic.   2. Right heart failure:  -Her murmur is due to tricuspid insufficiency which has been documented as severe in the past.  -In the past diuresis has been limited by hypotension. Home meds include metolazone 2.5 mg as needed for Wt > 134 lbs. -Admission wt 121 lbs, stable. This is lower than previous wts but do not think this is fluid deficit. Likely has lost true wt and may need to reevaluate her dry wt. Pt states that she has been 121 lbs for at least the last 2 months.  -She did not appear to be significantly volume overloaded. Was receiving IV fluids for renal insufficiency which were stopped in order to avoid fluid overload. Her renal function is actually good for her.  -Echo has been ordered to reevaluate pulmonary artery pressure.    3. CAD -This has been relatively mild and recent evaluation has shown no evidence of coronary insufficiency. Her last nuclear stress test was performed roughly one year ago. It showed low risk findings. I don't think she is a great candidate for percutaneous revascularization in view of her age and renal dysfunction, some not sure that repeating the study would serve her.   4. AFib -Excellent rate control -PPM was interrogated and showed uninterrupted afib since December. Underlying rates in the 60's. She was 93% pacing. Adjusted settings to VVIR at 60.   5. CKD:  -Creatinine appears to be at baseline 2.0-2.2. 1.69 today.  For questions or updates, please contact [place] Please consult [email or web address] for contact info under Cardiology/STEMI.      Signed, Daune Perch, NP  07/26/2017, 11:15 AM    I have seen and examined the patient along with Daune Perch, NP .  I have reviewed the chart, notes and new data.  I agree with NP's note.  Key new complaints: Not complaining of chest pain today. Key examination changes: Clear lungs, minimal ankle edema, some abdominal  distention Key new findings / data: Echo pending. Pacemaker check shows uninterrupted atrial fibrillation since December 2017. Underlying rhythm is with a ventricular response of around 60 bpm. Device programming change to VVIR at 60. Hopefully this will allow for less ventricular pacing which might help with her heart failure problems. Creatinine 1.69 is substantially better than her average creatinine over the last 12 months, with a baseline that I would appreciate to be a 2.0-2.2  PLAN: Will review echo, but at this point do  not plan any additional cardiac testing or change in therapeutic plan.  Sanda Klein, MD, [place] [phone] 07/26/2017, 12:48 PM

## 2017-07-26 NOTE — Progress Notes (Signed)
  Echocardiogram 2D Echocardiogram has been performed.  Darlina Sicilian M 07/26/2017, 9:12 AM

## 2017-07-26 NOTE — Progress Notes (Signed)
Received pt from dayshift RN. Pt is discharged, awaiting for PTAR. Denies any pain. VSS. Heart monitor and IV access removed. Discharged education and report called to [place] [facility] by Mare Ferrari, RN. Will monitor.

## 2017-07-26 NOTE — Discharge Summary (Signed)
Physician Discharge Summary  [name]:[id number] DOB: [birth year] DOA: 07/24/2017  PCP: Burnis Medin, MD  Admit date: 07/24/2017 Discharge date: 07/26/2017   Recommendations for Outpatient Follow-Up:   1. Outpatient cardiology follow up   Discharge Diagnosis:   Principal Problem:   Chest pain Active Problems:   CAD (coronary artery disease)   Chronic combined systolic and diastolic CHF, NYHA class 2 (HCC)   CKD (chronic kidney disease), stage IV (HCC)   Dementia   Atrial fibrillation, chronic (HCC)   Monoclonal gammopathy of unknown significance (MGUS)   Thrombocytopenia, idiopathic (HCC)   Pulmonary HTN (HCC)   Mild mitral regurgitation   Discharge disposition:  ALF  Discharge Condition: Improved.  Diet recommendation: Low sodium, heart healthy.  Wound care: None.   History of Present Illness:   [name] is a 83 y.o. female who presents with hypotension and atypical CP warranting 24hr observation   Hospital Course by Problem:   Atypical chest pain -epigastric tenderness and started after Keflex -increased PPI -given lactobacillus -eating well  Arthritis pain -lidocaine patch to should and hip -outpatient follow up  Chronic Right heart failure:  -Her murmur is due to tricuspid insufficiency which has been documented as severe in the past.  -In the past diuresis has been limited by hypotension. Home meds include metolazone 2.5 mg as needed for Wt > 134 lbs. -echo done but per cards will not change management -outpatient follow up   CAD -mild and recent evaluation has shown no evidence of coronary insufficiency -nuclear stress test was performed roughly one year ago: low risk  AFib -rate control -PPM was interrogated and showed uninterrupted afib since December. Underlying rates in the 60's. She was 93% pacing. Adjusted settings to VVIR at 60.   CKD:  -improved from baseline    Medical Consultants:     cards   Discharge Exam:   Vitals:   07/25/17 2021 07/26/17 0500  BP: 102/74 100/60  Pulse: 73 68  Resp: 18 18  Temp: 98 F (36.7 C) 97.9 F (36.6 C)  SpO2: 92% 93%   Vitals:   07/25/17 1545 07/25/17 1554 07/25/17 2021 07/26/17 0500  BP: (!) 95/52 (!) 84/48 102/74 100/60  Pulse:   73 68  Resp: 18  18 18   Temp: 97.6 F (36.4 C)  98 F (36.7 C) 97.9 F (36.6 C)  TempSrc: Oral  Oral Oral  SpO2: 91%  92% 93%  Weight:    55 kg (121 lb 4.8 oz)  Height:        Gen:  NAD    The results of significant diagnostics from this hospitalization (including imaging, microbiology, ancillary and laboratory) are listed below for reference.     Procedures and Diagnostic Studies:   Dg Chest 2 View  Result Date: 07/24/2017 CLINICAL DATA:  Midsternal chest pain extending into the right breast for 1 day. History of congestive heart failure and hypertension. EXAM: CHEST  2 VIEW COMPARISON:  07/19/2017 and 06/09/2017 radiographs. FINDINGS: Overall improved pulmonary aeration. Right subclavian pacemaker leads appear unchanged within the right atrium and right ventricle. The heart size is normal. There is aortic atherosclerosis. There is interval improved aeration of the lung bases. Focal density projecting over the left perihilar region on the frontal examination has no definite correlative finding on the lateral view. This could be related to a rib fracture or parenchymal inflammation and appears similar to the most recent study, although is new from 6 weeks ago. No definite  acute osseous findings. Postsurgical changes noted in the right humeral head. There is a thoracolumbar scoliosis. IMPRESSION: No definite acute findings. Left perihilar density is only seen on the frontal examination and may be related to healing rib fracture or underlying parenchymal inflammation/contusion. Electronically Signed   By: Richardean Sale M.D.   On: 07/24/2017 12:10     Labs:   Basic Metabolic  Panel:  Recent Labs Lab 07/24/17 1132 07/26/17 0404  NA 141 139  K 4.2 4.5  CL 105 108  CO2 29 27  GLUCOSE 115* 90  BUN 21* 18  CREATININE 1.81* 1.69*  CALCIUM 9.2 9.4   GFR Estimated Creatinine Clearance: 16.2 mL/min (A) (by C-G formula based on SCr of 1.69 mg/dL (H)). Liver Function Tests:  Recent Labs Lab 07/24/17 1132  AST 23  ALT 16  ALKPHOS 76  BILITOT 0.8  PROT 6.2*  ALBUMIN 3.1*    Recent Labs Lab 07/24/17 1132  LIPASE 33   No results for input(s): AMMONIA in the last 168 hours. Coagulation profile No results for input(s): INR, PROTIME in the last 168 hours.  CBC:  Recent Labs Lab 07/24/17 1132 07/26/17 0404  WBC 5.7 5.6  NEUTROABS 4.4  --   HGB 12.2 12.4  HCT 39.0 39.9  MCV 98.0 98.8  PLT 125* 106*   Cardiac Enzymes:  Recent Labs Lab 07/24/17 1453 07/24/17 2029 07/25/17 0213  TROPONINI 0.03* 0.03* 0.03*   BNP: Invalid input(s): POCBNP CBG: No results for input(s): GLUCAP in the last 168 hours. D-Dimer No results for input(s): DDIMER in the last 72 hours. Hgb A1c No results for input(s): HGBA1C in the last 72 hours. Lipid Profile No results for input(s): CHOL, HDL, LDLCALC, TRIG, CHOLHDL, LDLDIRECT in the last 72 hours. Thyroid function studies  Recent Labs  07/25/17 1637  TSH 1.831   Anemia work up No results for input(s): VITAMINB12, FOLATE, FERRITIN, TIBC, IRON, RETICCTPCT in the last 72 hours. Microbiology Recent Results (from the past 240 hour(s))  Urine culture     Status: Abnormal   Collection Time: 07/24/17  1:51 PM  Result Value Ref Range Status   Specimen Description URINE, CLEAN CATCH  Final   Special Requests NONE  Final   Culture 20,000 COLONIES/mL ENTEROCOCCUS FAECIUM (A)  Final   Report Status 07/26/2017 FINAL  Final   Organism ID, Bacteria ENTEROCOCCUS FAECIUM (A)  Final      Susceptibility   Enterococcus faecium - MIC*    AMPICILLIN >=32 RESISTANT Resistant     LEVOFLOXACIN >=8 RESISTANT Resistant      NITROFURANTOIN 64 INTERMEDIATE Intermediate     VANCOMYCIN <=0.5 SENSITIVE Sensitive     * 20,000 COLONIES/mL ENTEROCOCCUS FAECIUM     Discharge Instructions:   Discharge Instructions    Diet - low sodium heart healthy    Complete by:  As directed    Increase activity slowly    Complete by:  As directed      Allergies as of 07/26/2017      Reactions   Other Shortness Of Breath   Detergents, perfumes and other strong, odor-emitting compounds   Tramadol Shortness Of Breath, Nausea Only   Ibuprofen Other (See Comments)   Listed as an allergy on MAR      Medication List    STOP taking these medications   cephALEXin 500 MG capsule Commonly known as:  KEFLEX   denosumab 60 MG/ML Soln injection Commonly known as:  PROLIA   Zinc Oxide 6 % Crea  TAKE these medications   acetaminophen 325 MG tablet Commonly known as:  TYLENOL Take 650 mg by mouth every 4 (four) hours as needed (for pain).   amiodarone 200 MG tablet Commonly known as:  PACERONE Take 1 tablet (200 mg total) by mouth daily.   baclofen 10 MG tablet Commonly known as:  LIORESAL Take 10 mg by mouth every 12 (twelve) hours as needed for muscle spasms.   bisacodyl 10 MG suppository Commonly known as:  DULCOLAX Place 1 suppository (10 mg total) rectally daily as needed for moderate constipation.   citalopram 10 MG tablet Commonly known as:  CELEXA Take 10 mg by mouth daily.   ELIQUIS 2.5 MG Tabs tablet Generic drug:  apixaban TAKE ONE TABLET BY MOUTH TWICE DAILY What changed:  See the new instructions.   Iron 325 (65 Fe) MG Tabs Take 1 tablet by mouth daily.   lactobacillus acidophilus Tabs tablet Take 2 tablets by mouth 3 (three) times daily.   levothyroxine 75 MCG tablet Commonly known as:  SYNTHROID, LEVOTHROID TAKE 1 TABLET BY MOUTH ONCE DAILY What changed:  See the new instructions.   lidocaine 5 % Commonly known as:  LIDODERM Place 2 patches onto the skin daily. Remove & Discard  patch within 12 hours or as directed by MD   loratadine 10 MG tablet Commonly known as:  CLARITIN Take 1 tablet (10 mg total) by mouth daily.   meclizine 12.5 MG tablet Commonly known as:  ANTIVERT Take 1 tablet (12.5 mg total) by mouth 2 (two) times daily as needed for dizziness.   metolazone 2.5 MG tablet Commonly known as:  ZAROXOLYN Take 1 tablet (2.5 mg total) by mouth as needed (for weight 134 lb or greater). What changed:  when to take this  reasons to take this   MILK OF MAGNESIA 400 MG/5ML suspension Generic drug:  magnesium hydroxide Take 30 mLs by mouth daily as needed for mild constipation.   nitroGLYCERIN 0.4 MG SL tablet Commonly known as:  NITROSTAT Place 1 tablet (0.4 mg total) under the tongue every 5 (five) minutes as needed. For chest pain What changed:  reasons to take this  additional instructions   pantoprazole 40 MG tablet Commonly known as:  PROTONIX Take 1 tablet (40 mg total) by mouth 2 (two) times daily. What changed:  when to take this   polyethylene glycol packet Commonly known as:  MIRALAX Take 17 g by mouth daily. Mix with 8 ounces of liquid.   potassium chloride SA 20 MEQ tablet Commonly known as:  K-DUR,KLOR-CON Take 1 tablet (20 mEq total) by mouth daily. Take an additional 79meq with Metolazone. What changed:  additional instructions   senna 8.6 MG Tabs tablet Commonly known as:  SENOKOT Take 2 tablets (17.2 mg total) by mouth at bedtime.   simvastatin 10 MG tablet Commonly known as:  ZOCOR TAKE ONE TABLET BY MOUTH AT BEDTIME What changed:  See the new instructions.   torsemide 20 MG tablet Commonly known as:  DEMADEX Take 1 tablet (20 mg total) by mouth daily. Please start on 11/22/2016 What changed:  additional instructions            Discharge Care Instructions        Start     Ordered   07/27/17 0000  lidocaine (LIDODERM) 5 %  Every 24 hours     07/26/17 1304   07/26/17 0000  bisacodyl (DULCOLAX) 10 MG  suppository  Daily PRN     07/26/17 1304  07/26/17 0000  lactobacillus acidophilus (BACID) TABS tablet  3 times daily     07/26/17 1304   07/26/17 0000  pantoprazole (PROTONIX) 40 MG tablet  2 times daily     07/26/17 1304   07/26/17 0000  Increase activity slowly     07/26/17 1458   07/26/17 0000  Diet - low sodium heart healthy     07/26/17 1458      Contact information for follow-up providers    Panosh, Standley Brooking, MD Follow up in 1 week(s).   Specialties:  Internal Medicine, Pediatrics Contact information: [place]  [phone]            Contact information for after-discharge care    Destination    [facility] Follow up.   Specialty:  [place] Contact information: [place] [phone]                   Time coordinating discharge: 35 min  Signed:  Geradine Girt   [facility] 07/26/2017, 2:59 PM

## 2017-07-28 ENCOUNTER — Encounter: Payer: Self-pay | Admitting: Cardiology

## 2017-07-31 ENCOUNTER — Encounter: Payer: Self-pay | Admitting: Internal Medicine

## 2017-07-31 ENCOUNTER — Ambulatory Visit (INDEPENDENT_AMBULATORY_CARE_PROVIDER_SITE_OTHER): Payer: Medicare Other | Admitting: Internal Medicine

## 2017-07-31 VITALS — BP 136/74 | HR 93 | Temp 98.0°F | Wt 123.8 lb

## 2017-07-31 DIAGNOSIS — Z23 Encounter for immunization: Secondary | ICD-10-CM

## 2017-07-31 DIAGNOSIS — L989 Disorder of the skin and subcutaneous tissue, unspecified: Secondary | ICD-10-CM

## 2017-07-31 DIAGNOSIS — I5042 Chronic combined systolic (congestive) and diastolic (congestive) heart failure: Secondary | ICD-10-CM | POA: Diagnosis not present

## 2017-07-31 DIAGNOSIS — R54 Age-related physical debility: Secondary | ICD-10-CM | POA: Diagnosis not present

## 2017-07-31 NOTE — Progress Notes (Signed)
Chief Complaint  Patient presents with  . Acute Visit    Blister-like spot on right thumb, pain and swelling in thumb and pointer finger. Another spot forming on right pointer finger     HPI: [name] 83 y.o.  SDA   jsut hospitalized for chest paina nd fu cardiology   She has noticed a bump on her right thumb that the hospital was noted also that she states my been there month but family member thinks it's more recent like weeks.She doesn't remember trauma no fever minimally possible tender when you touch it question was that a blister.  She just finished antibiotic Keflex for UTI from when she was in the hospital She also had a little bump on her right index finger palmar surface not very significant  Past history skin cancer removed from the forehead.  She is now living at [facility] when social services got involved in her living situation. She states it is a place where "people are they're waiting to die". However there are some people she can socialize with.  ROS: See pertinent positives and negatives per HPI.  Past Medical History:  Diagnosis Date  . Anemia   . Atrial fibrillation ([place])    a. Dx 01/2014->Eliquis started.  . Cancer ([place])   . Chest pain    a. 2011 Neg MV;  b. 01/2014 Cath: LM 20-30, LAD nl, D1 nl, LCX nl, OM1 nl, RCA dom 30-15m, PD/PL nl, EF 65%->Med Rx; c. 03/2016 MV: no ischemia/infarct. EF 63%.  . Degenerative joint disease   . Diastolic CHF, acute on chronic ([place]) 01/31/2012  . Dyslipidemia   . Dysrhythmia   . Family history of adverse reaction to anesthesia    " multiple family members have difficulty waking "  . GERD (gastroesophageal reflux disease)   . History of skin cancer    tafeen/ non melanoma.   Marland Kitchen Hx of varicella   . Hypertension   . Hypothyroidism   . Monoclonal gammopathies   . Pacemaker   . Palpitations    PVCs/bigeminy on event in May 2009 revealing this was relatively asymptomatic  . Right lower quadrant abdominal pain  07/19/2012   Recurrent  With nausea    This is new since she had ct for llq pain in [facility]    R/o appendiceal problem  Hernia  Get ct scan  And plan  Fu     . Thrombocytopenia ([place])     Family History  Problem Relation Age of Onset  . Coronary artery disease Father   . Heart disease Father   . Heart attack Father   . Alzheimer's disease Mother   . Lung cancer Brother 52  . Lung cancer Unknown     Social History   Social History  . Marital status: Widowed    Spouse name: N/A  . Number of children: 3  . Years of education: N/A   Occupational History  .  Retired    [employer], book keeping   Social History Main Topics  . Smoking status: Never Smoker  . Smokeless tobacco: Never Used  . Alcohol use No  . Drug use: No  . Sexual activity: No   Other Topics Concern  . 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 of 1  Has 2 labs    Neg tad FA  G3P3      Daughter and gets some of her food and cooks at her house . Eats with her.      Daughter Joseph Art handles medications. Sees HH and PT once a week.   Helper  [place]  there Friday 1-3 pm   Pt lives alone, Joseph Art lives 30 min away.  05-15-17    Outpatient Medications Prior to Visit  Medication Sig Dispense Refill  . acetaminophen (TYLENOL) 325 MG tablet Take 650 mg by mouth every 4 (four) hours as needed (for pain).     Marland Kitchen amiodarone (PACERONE) 200 MG tablet Take 1 tablet (200 mg total) by mouth daily. 90 tablet 3  . baclofen (LIORESAL) 10 MG tablet Take 10 mg by mouth every 12 (twelve) hours as needed for muscle spasms.     . bisacodyl (DULCOLAX) 10 MG suppository Place 1 suppository (10 mg total) rectally daily as needed for moderate constipation. 12 suppository 0  . citalopram (CELEXA) 10 MG tablet Take 10 mg by mouth daily.    Marland Kitchen ELIQUIS 2.5 MG TABS tablet TAKE ONE TABLET BY MOUTH TWICE DAILY (Patient taking  differently: Take 2.5 mg by mouth two times a day) 60 tablet 6  . Ferrous Sulfate (IRON) 325 (65 Fe) MG TABS Take 1 tablet by mouth daily. (Patient taking differently: Take 325 mg by mouth daily. ) 180 each 0  . lactobacillus acidophilus (BACID) TABS tablet Take 2 tablets by mouth 3 (three) times daily. 120 tablet 0  . levothyroxine (SYNTHROID, LEVOTHROID) 75 MCG tablet TAKE 1 TABLET BY MOUTH ONCE DAILY (Patient taking differently: Take 75 mcg by mouth once a day) 90 tablet 0  . lidocaine (LIDODERM) 5 % Place 2 patches onto the skin daily. Remove & Discard patch within 12 hours or as directed by MD 30 patch 0  . loratadine (CLARITIN) 10 MG tablet Take 1 tablet (10 mg total) by mouth daily. 30 tablet 0  . magnesium hydroxide (MILK OF MAGNESIA) 400 MG/5ML suspension Take 30 mLs by mouth daily as needed for mild constipation.    . meclizine (ANTIVERT) 12.5 MG tablet Take 1 tablet (12.5 mg total) by mouth 2 (two) times daily as needed for dizziness. 10 tablet 0  . metolazone (ZAROXOLYN) 2.5 MG tablet Take 1 tablet (2.5 mg total) by mouth as needed (for weight 134 lb or greater). (Patient taking differently: Take 2.5 mg by mouth daily as needed (if weight is 134 pounds or greater). ) 10 tablet 3  . nitroGLYCERIN (NITROSTAT) 0.4 MG SL tablet Place 1 tablet (0.4 mg total) under the tongue every 5 (five) minutes as needed. For chest pain (Patient taking differently: Place 0.4 mg under the tongue every 5 (five) minutes as needed for chest pain. ) 25 tablet 6  . pantoprazole (PROTONIX) 40 MG tablet Take 1 tablet (40 mg total) by mouth 2 (two) times daily. 60 tablet 0  . polyethylene glycol (MIRALAX) packet Take 17 g by mouth daily. Mix with 8 ounces of liquid. 14 each 0  . potassium chloride SA (K-DUR,KLOR-CON) 20 MEQ tablet Take 1 tablet (20 mEq total) by mouth daily. Take an additional 53meq with Metolazone. (Patient taking differently: Take 20 mEq by mouth daily. ) 90 tablet 2  . senna (SENOKOT) 8.6 MG TABS  tablet Take 2 tablets (17.2 mg total) by mouth at bedtime. 60 each 0  . simvastatin (ZOCOR) 10 MG tablet TAKE ONE TABLET BY MOUTH AT BEDTIME (Patient taking differently: Take 10 mg by mouth at bedtime) 30  tablet 3  . torsemide (DEMADEX) 20 MG tablet Take 1 tablet (20 mg total) by mouth daily. Please start on 11/22/2016 (Patient taking differently: Take 20 mg by mouth daily. ) 60 tablet 6   No facility-administered medications prior to visit.      EXAM:  BP 136/74 (BP Location: Right Arm, Patient Position: Sitting, Cuff Size: Normal)   Pulse 93   Temp 98 F (36.7 C) (Oral)   Wt 123 lb 12.8 oz (56.2 kg)   BMI 25.87 kg/m   Body mass index is 25.87 kg/m.  GENERAL: vitals reviewed and listed above, alert, oriented, appears well hydrated and in no acute distress HEENT: atraumatic, conjunctiva  clear, no obvious abnormalities on inspection of external nose and ears  MS:  oa changes  but no acute joint swelling warmth    3-4 mm firm yellow  Nodule on right s thumb with erythematous base. Min tender   ROM nl     Index finger flat small ? Corn type lesion non tender   PSYCH: pleasant and cooperative, memory not tested      Lab Results  Component Value Date   WBC 5.6 07/26/2017   HGB 12.4 07/26/2017   HCT 39.9 07/26/2017   PLT 106 (L) 07/26/2017   GLUCOSE 90 07/26/2017   CHOL 178 05/28/2017   TRIG 42 05/28/2017   HDL 97 05/28/2017   LDLCALC 73 05/28/2017   ALT 16 07/24/2017   AST 23 07/24/2017   NA 139 07/26/2017   K 4.5 07/26/2017   CL 108 07/26/2017   CREATININE 1.69 (H) 07/26/2017   BUN 18 07/26/2017   CO2 27 07/26/2017   TSH 1.831 07/25/2017   INR 1.23 03/08/2017   HGBA1C 5.7 (H) 05/28/2017   Wt Readings from Last 3 Encounters:  07/31/17 123 lb 12.8 oz (56.2 kg)  07/26/17 121 lb 4.8 oz (55 kg)  06/09/17 121 lb (54.9 kg)     ASSESSMENT AND PLAN:  Discussed the following assessment and plan:  Finger lesion - recent hosp dosent seen related not gout appearing ?  cause cystic feeling plan derm to see   Advanced age  Need for influenza vaccination - Plan: Flu vaccine HIGH DOSE PF (Fluzone High dose)  Chronic combined systolic and diastolic CHF (congestive heart failure) ([place]) I thinkt the other area looks like a callous  Minor corn like  keratotic lesion not related to  Thumb lesion.  Discussed her living arrangement safety issues. Influenza vaccine today. Make sure they contact cardiology be because her contact numbers are different to make sure follow-up post hospital. she states she had blood tests at [facility] this am  -Patient advised to return or notify health care team  if symptoms worsen ,persist or new concerns arise.  Patient Instructions  Not sure  What this bump is     Perhaps a cyst   Or some sort .  Call your dermatologist for appt . To evaluate.      Let us know if we need to do referral.           Standley Brooking. Panosh M.D.

## 2017-07-31 NOTE — Patient Instructions (Signed)
Not sure  What this bump is     Perhaps a cyst   Or some sort .  Call your dermatologist for appt . To evaluate.      Let us know if we need to do referral.

## 2017-08-04 ENCOUNTER — Encounter: Payer: Self-pay | Admitting: Internal Medicine

## 2017-08-13 ENCOUNTER — Emergency Department (HOSPITAL_COMMUNITY)
Admission: EM | Admit: 2017-08-13 | Discharge: 2017-08-13 | Disposition: A | Discharge status: Home / Self Care | Payer: Medicare Other | Attending: Emergency Medicine | Admitting: Emergency Medicine

## 2017-08-13 ENCOUNTER — Encounter (HOSPITAL_COMMUNITY): Payer: Self-pay | Admitting: Emergency Medicine

## 2017-08-13 ENCOUNTER — Emergency Department (HOSPITAL_COMMUNITY): Payer: Medicare Other

## 2017-08-13 DIAGNOSIS — Z79899 Other long term (current) drug therapy: Secondary | ICD-10-CM | POA: Insufficient documentation

## 2017-08-13 DIAGNOSIS — I5042 Chronic combined systolic (congestive) and diastolic (congestive) heart failure: Secondary | ICD-10-CM | POA: Insufficient documentation

## 2017-08-13 DIAGNOSIS — N184 Chronic kidney disease, stage 4 (severe): Secondary | ICD-10-CM | POA: Diagnosis not present

## 2017-08-13 DIAGNOSIS — Z7901 Long term (current) use of anticoagulants: Secondary | ICD-10-CM | POA: Insufficient documentation

## 2017-08-13 DIAGNOSIS — Z950 Presence of cardiac pacemaker: Secondary | ICD-10-CM | POA: Diagnosis not present

## 2017-08-13 DIAGNOSIS — R1011 Right upper quadrant pain: Secondary | ICD-10-CM | POA: Insufficient documentation

## 2017-08-13 DIAGNOSIS — I130 Hypertensive heart and chronic kidney disease with heart failure and stage 1 through stage 4 chronic kidney disease, or unspecified chronic kidney disease: Secondary | ICD-10-CM | POA: Diagnosis not present

## 2017-08-13 DIAGNOSIS — K5900 Constipation, unspecified: Secondary | ICD-10-CM | POA: Insufficient documentation

## 2017-08-13 DIAGNOSIS — E039 Hypothyroidism, unspecified: Secondary | ICD-10-CM | POA: Diagnosis not present

## 2017-08-13 DIAGNOSIS — I2510 Atherosclerotic heart disease of native coronary artery without angina pectoris: Secondary | ICD-10-CM | POA: Insufficient documentation

## 2017-08-13 DIAGNOSIS — J189 Pneumonia, unspecified organism: Secondary | ICD-10-CM | POA: Insufficient documentation

## 2017-08-13 LAB — CBC WITH DIFFERENTIAL/PLATELET
BASOS PCT: 0 %
Basophils Absolute: 0 10*3/uL (ref 0.0–0.1)
EOS ABS: 0 10*3/uL (ref 0.0–0.7)
EOS PCT: 1 %
HCT: 40.4 % (ref 36.0–46.0)
Hemoglobin: 12.9 g/dL (ref 12.0–15.0)
LYMPHS ABS: 0.8 10*3/uL (ref 0.7–4.0)
Lymphocytes Relative: 13 %
MCH: 31.4 pg (ref 26.0–34.0)
MCHC: 31.9 g/dL (ref 30.0–36.0)
MCV: 98.3 fL (ref 78.0–100.0)
MONOS PCT: 5 %
Monocytes Absolute: 0.3 10*3/uL (ref 0.1–1.0)
Neutro Abs: 4.6 10*3/uL (ref 1.7–7.7)
Neutrophils Relative %: 81 %
PLATELETS: 144 10*3/uL — AB (ref 150–400)
RBC: 4.11 MIL/uL (ref 3.87–5.11)
RDW: 15.5 % (ref 11.5–15.5)
WBC: 5.7 10*3/uL (ref 4.0–10.5)

## 2017-08-13 LAB — URINALYSIS, ROUTINE W REFLEX MICROSCOPIC
BILIRUBIN URINE: NEGATIVE
Glucose, UA: NEGATIVE mg/dL
HGB URINE DIPSTICK: NEGATIVE
Ketones, ur: NEGATIVE mg/dL
Leukocytes, UA: NEGATIVE
Nitrite: NEGATIVE
PROTEIN: NEGATIVE mg/dL
Specific Gravity, Urine: 1.006 (ref 1.005–1.030)
pH: 5 (ref 5.0–8.0)

## 2017-08-13 LAB — COMPREHENSIVE METABOLIC PANEL
ALK PHOS: 86 U/L (ref 38–126)
ALT: 16 U/L (ref 14–54)
ANION GAP: 7 (ref 5–15)
AST: 22 U/L (ref 15–41)
Albumin: 3.5 g/dL (ref 3.5–5.0)
BUN: 26 mg/dL — ABNORMAL HIGH (ref 6–20)
CALCIUM: 9.7 mg/dL (ref 8.9–10.3)
CHLORIDE: 107 mmol/L (ref 101–111)
CO2: 25 mmol/L (ref 22–32)
CREATININE: 1.79 mg/dL — AB (ref 0.44–1.00)
GFR, EST AFRICAN AMERICAN: 28 mL/min — AB (ref >60)
GFR, EST NON AFRICAN AMERICAN: 24 mL/min — AB (ref >60)
Glucose, Bld: 104 mg/dL — ABNORMAL HIGH (ref 65–99)
Potassium: 4.4 mmol/L (ref 3.5–5.1)
SODIUM: 139 mmol/L (ref 135–145)
Total Bilirubin: 1.2 mg/dL (ref 0.3–1.2)
Total Protein: 6.7 g/dL (ref 6.5–8.1)

## 2017-08-13 LAB — LIPASE, BLOOD: LIPASE: 51 U/L (ref 11–51)

## 2017-08-13 MED ORDER — DOXYCYCLINE HYCLATE 100 MG PO CAPS: 100.0000 mg | ORAL_CAPSULE | Freq: Two times a day (BID) | ORAL | 0 refills | Status: DC

## 2017-08-13 MED ORDER — POLYETHYLENE GLYCOL 3350 17 G PO PACK: 17.0000 g | PACK | Freq: Every day | ORAL | 0 refills | Status: AC

## 2017-08-13 MED ORDER — SODIUM CHLORIDE 0.9 % IV BOLUS (SEPSIS)
500.0000 mL | Freq: Once | INTRAVENOUS | Status: AC
  Administered 2017-08-13: 500 mL via INTRAVENOUS

## 2017-08-13 NOTE — ED Provider Notes (Signed)
[facility] DEPT Provider Note   CSN: [id number] Arrival date & time: 08/13/17  1104     History   Chief Complaint Chief Complaint  Patient presents with  . Abdominal Pain    HPI CINNAMON MORENCY is a 83 y.o. female.  Patient is a 83 year old female who presents with abdominal pain. She states that last night she started having pain in her upper abdomen. She denies any associated nausea or vomiting. She states it's been constant and worsening since yesterday. She also states that she is urinating frequently and having some burning on urination. She denies any chest pain or shortness of breath. She did not have a bowel movement yesterday or today but otherwise has been having normal bowel movements. She denies any history of similar pain in the past. She has been evaluated frequently for epigastric and chest pain but states that this pain is different than she has had in the past. She has not taken anything today for the pain. She denies any for any pain medication currently.      Past Medical History:  Diagnosis Date  . Anemia   . Atrial fibrillation ([place])    a. Dx 01/2014->Eliquis started.  . Cancer ([place])   . Chest pain    a. 2011 Neg MV;  b. 01/2014 Cath: LM 20-30, LAD nl, D1 nl, LCX nl, OM1 nl, RCA dom 30-30m, PD/PL nl, EF 65%->Med Rx; c. 03/2016 MV: no ischemia/infarct. EF 63%.  . Degenerative joint disease   . Diastolic CHF, acute on chronic ([place]) 01/31/2012  . Dyslipidemia   . Dysrhythmia   . Family history of adverse reaction to anesthesia    " multiple family members have difficulty waking "  . GERD (gastroesophageal reflux disease)   . History of skin cancer    tafeen/ non melanoma.   Marland Kitchen Hx of varicella   . Hypertension   . Hypothyroidism   . Monoclonal gammopathies   . Pacemaker   . Palpitations    PVCs/bigeminy on event in May 2009 revealing this was relatively asymptomatic  . Right lower quadrant abdominal pain 07/19/2012   Recurrent  With nausea    This is  new since she had ct for llq pain in [facility]    R/o appendiceal problem  Hernia  Get ct scan  And plan  Fu     . Thrombocytopenia [facility])     Patient Active Problem List   Diagnosis Date Noted  . CAD (coronary artery disease) 07/24/2017  . Chronic combined systolic and diastolic CHF, NYHA class 2 ([place]) 07/24/2017  . CKD (chronic kidney disease), stage IV ([place]) 07/24/2017  . Dementia 07/24/2017  . Atrial fibrillation, chronic ([place]) 07/24/2017  . Monoclonal gammopathy of unknown significance (MGUS) 07/24/2017  . Thrombocytopenia, idiopathic ([place]) 07/24/2017  . Pulmonary HTN ([place]) 07/24/2017  . Mild mitral regurgitation 07/24/2017  . Idiopathic hypotension   . Acute urinary retention 06/01/2017  . Acute on chronic diastolic (congestive) heart failure ([place]) 05/27/2017  . Fall 05/27/2017  . Acute metabolic encephalopathy 17/61/6073  . Mitral regurgitation 03/10/2017  . Mitral stenosis 03/10/2017  . Chest pain 03/08/2017  . Dehydration 11/18/2016  . Intractable nausea and vomiting 11/18/2016  . Generalized weakness 11/18/2016  . Acute kidney injury superimposed on chronic kidney disease ([place]) 11/11/2016  . Hypokalemia 11/11/2016  . Hypertensive heart disease 04/01/2016  . PAF (paroxysmal atrial fibrillation) ([place]) 03/31/2016  . Chest pain with moderate risk for cardiac etiology, negative MI and Nuc study, + muscular skeletal   .  Atypical chest pain 01/21/2016  . CKD (chronic kidney disease) stage 4, GFR 15-29 ml/min (HCC) 05/09/2015  . Anemia of chronic disease 05/09/2015  . Dyslipidemia 05/09/2015  . Elevated troponin   . Iron deficiency anemia 11/21/2014  . Pacemaker 04/27/2014  . Chronic anticoagulation 02/25/2014  . Chronic diastolic CHF (congestive heart failure) ([place]) 02/24/2014  . Atrial fibrillation ([place]) 01/29/2014  . MGUS (monoclonal gammopathy of unknown significance) 08/01/2013  . Thrombocytopenia ([place])   . Hypothyroidism 02/19/2012  . Hyperlipidemia 12/15/2008  .  HYPERTENSION, BENIGN 12/15/2008    Past Surgical History:  Procedure Laterality Date  . CARDIOVERSION N/A 02/26/2014   Procedure: CARDIOVERSION AT BEDSIDE;  Surgeon: Pixie Casino, MD;  Location: [facility];  Service: Cardiovascular;  Laterality: N/A;  . CARDIOVERSION N/A 04/22/2014   Procedure: CARDIOVERSION (BEDSIDE);  Surgeon: [name], MD;  Location: [facility] OR;  Service: Cardiovascular;  Laterality: N/A;  . CATARACT EXTRACTION     Bilateral  implantt  . CESAREAN SECTION     times 2  . CHOLECYSTECTOMY  1999  . ESOPHAGOGASTRODUODENOSCOPY N/A 07/06/2014   Procedure: ESOPHAGOGASTRODUODENOSCOPY (EGD);  Surgeon: Ladene Artist, MD;  Location[facility] ENDOSCOPY;  Service: Endoscopy;  Laterality: N/A;  . INSERT / REPLACE / REMOVE PACEMAKER    . LEFT HEART CATHETERIZATION WITH CORONARY ANGIOGRAM N/A 01/29/2014   Procedure: LEFT HEART CATHETERIZATION WITH CORONARY ANGIOGRAM;  Surgeon: Blane Ohara, MD;  Location: [facility] CATH LAB;  Service: Cardiovascular;  Laterality: N/A;  . PACEMAKER INSERTION  04/23/2014   STJ Assurity dual chamber pacemaker implanted by Dr Rayann Heman  . PERMANENT PACEMAKER INSERTION N/A 04/23/2014   Procedure: PERMANENT PACEMAKER INSERTION;  Surgeon: Evans Lance, MD;  Location: [facility] CATH LAB;  Service: Cardiovascular;  Laterality: N/A;  . RIGHT HEART CATHETERIZATION N/A 09/22/2014   Procedure: RIGHT HEART CATH;  Surgeon: Jolaine Artist, MD;  Location: [facility] CATH LAB;  Service: Cardiovascular;  Laterality: N/A;  . SHOULDER SURGERY  1996   Right     OB History    No data available       Home Medications    Prior to Admission medications   Medication Sig Start Date End Date Taking? Authorizing Provider  acetaminophen (TYLENOL) 325 MG tablet Take 650 mg by mouth every 4 (four) hours as needed (for pain).    Yes [provider]  amiodarone (PACERONE) 200 MG tablet Take 1 tablet (200 mg total) by mouth daily. 11/25/16  Yes Bensimhon, Shaune Pascal, MD  baclofen (LIORESAL) 10 MG  tablet Take 10 mg by mouth every 12 (twelve) hours as needed for muscle spasms.    Yes [provider]  bisacodyl (DULCOLAX) 10 MG suppository Place 1 suppository (10 mg total) rectally daily as needed for moderate constipation. 07/26/17  Yes Vann, Jessica U, DO  citalopram (CELEXA) 10 MG tablet Take 10 mg by mouth daily.   Yes [provider]  ELIQUIS 2.5 MG TABS tablet TAKE ONE TABLET BY MOUTH TWICE DAILY Patient taking differently: Take 2.5 mg by mouth two times a day 05/22/17  Yes Bensimhon, Shaune Pascal, MD  Ferrous Sulfate (IRON) 325 (65 Fe) MG TABS Take 1 tablet by mouth daily. Patient taking differently: Take 325 mg by mouth daily.  07/01/16  Yes Johnson, Clanford L, MD  lactobacillus acidophilus (BACID) TABS tablet Take 2 tablets by mouth 3 (three) times daily. 07/26/17  Yes Vann, Jessica U, DO  levothyroxine (SYNTHROID, LEVOTHROID) 75 MCG tablet TAKE 1 TABLET BY MOUTH ONCE DAILY Patient taking differently:  Take 75 mcg by mouth once a day 05/22/17  Yes Panosh, Standley Brooking, MD  lidocaine (LIDODERM) 5 % Place 2 patches onto the skin daily. Remove & Discard patch within 12 hours or as directed by MD 07/27/17  Yes Eliseo Squires, Tomi Bamberger, DO  loratadine (CLARITIN) 10 MG tablet Take 1 tablet (10 mg total) by mouth daily. 03/10/17  Yes Jani Gravel, MD  LORazepam (ATIVAN) 0.5 MG tablet Take 0.5 mg by mouth every 12 (twelve) hours as needed for anxiety.   Yes [provider]  magnesium hydroxide (MILK OF MAGNESIA) 400 MG/5ML suspension Take 30 mLs by mouth daily as needed for mild constipation.   Yes [provider]  meclizine (ANTIVERT) 12.5 MG tablet Take 1 tablet (12.5 mg total) by mouth 2 (two) times daily as needed for dizziness. 09/14/16  Yes Bensimhon, Shaune Pascal, MD  metolazone (ZAROXOLYN) 2.5 MG tablet Take 1 tablet (2.5 mg total) by mouth as needed (for weight 134 lb or greater). Patient taking differently: Take 2.5 mg by mouth daily as needed (if weight is 134 pounds or greater).   03/09/16  Yes Bensimhon, Shaune Pascal, MD  nitroGLYCERIN (NITROSTAT) 0.4 MG SL tablet Place 1 tablet (0.4 mg total) under the tongue every 5 (five) minutes as needed. For chest pain Patient taking differently: Place 0.4 mg under the tongue every 5 (five) minutes as needed for chest pain.  05/06/16  Yes Bensimhon, Shaune Pascal, MD  pantoprazole (PROTONIX) 40 MG tablet Take 1 tablet (40 mg total) by mouth 2 (two) times daily. 07/26/17  Yes Eulogio Bear U, DO  potassium chloride SA (K-DUR,KLOR-CON) 20 MEQ tablet Take 1 tablet (20 mEq total) by mouth daily. Take an additional 31meq with Metolazone. Patient taking differently: Take 20 mEq by mouth daily.  06/09/17  Yes Pattricia Boss, MD  senna (SENOKOT) 8.6 MG TABS tablet Take 2 tablets (17.2 mg total) by mouth at bedtime. 11/13/16  Yes Patrecia Pour, Christean Grief, MD  simvastatin (ZOCOR) 10 MG tablet TAKE ONE TABLET BY MOUTH AT BEDTIME Patient taking differently: Take 10 mg by mouth at bedtime 02/14/17  Yes Larey Dresser, MD  torsemide (DEMADEX) 20 MG tablet Take 1 tablet (20 mg total) by mouth daily. Please start on 11/22/2016 Patient taking differently: Take 20 mg by mouth daily.  06/09/17  Yes Pattricia Boss, MD  doxycycline (VIBRAMYCIN) 100 MG capsule Take 1 capsule (100 mg total) by mouth 2 (two) times daily. One po bid x 7 days 08/13/17   Malvin Johns, MD  polyethylene glycol [facility]) packet Take 17 g by mouth daily. Mix with 8 ounces of liquid. 08/13/17   Malvin Johns, MD    Family History Family History  Problem Relation Age of Onset  . Coronary artery disease Father   . Heart disease Father   . Heart attack Father   . Alzheimer's disease Mother   . Lung cancer Brother 59  . Lung cancer Unknown     Social History Social History  Substance Use Topics  . Smoking status: Never Smoker  . Smokeless tobacco: Never Used  . Alcohol use No     Allergies   Other; Tramadol; and Ibuprofen   Review of Systems Review of Systems  Constitutional: Negative  for chills, diaphoresis, fatigue and fever.  HENT: Negative for congestion, rhinorrhea and sneezing.   Eyes: Negative.   Respiratory: Negative for cough, chest tightness and shortness of breath.   Cardiovascular: Negative for chest pain and leg swelling.  Gastrointestinal: Positive for abdominal pain.  Negative for blood in stool, diarrhea, nausea and vomiting.  Genitourinary: Positive for frequency. Negative for difficulty urinating, flank pain and hematuria.  Musculoskeletal: Negative for arthralgias and back pain.  Skin: Negative for rash.  Neurological: Negative for dizziness, speech difficulty, weakness, numbness and headaches.     Physical Exam Updated Vital Signs BP (!) 149/68   Pulse (!) 50   Temp 98.2 F (36.8 C) (Oral)   Resp (!) 21   Ht 4\' 10"  (1.473 m)   Wt 55.8 kg (123 lb)   SpO2 100%   BMI 25.71 kg/m   Physical Exam  Constitutional: She is oriented to person, place, and time. She appears well-developed and well-nourished.  HENT:  Head: Normocephalic and atraumatic.  Eyes: Pupils are equal, round, and reactive to light.  Neck: Normal range of motion. Neck supple.  Cardiovascular: Normal rate, regular rhythm and normal heart sounds.   Pulmonary/Chest: Effort normal and breath sounds normal. No respiratory distress. She has no wheezes. She has no rales. She exhibits no tenderness.  Abdominal: Soft. Bowel sounds are normal. There is tenderness (moderate diffuse tenderness, the pain seems to be more located in the right upper and lower abdomen). There is no rebound and no guarding.  Musculoskeletal: Normal range of motion. She exhibits no edema.  Lymphadenopathy:    She has no cervical adenopathy.  Neurological: She is alert and oriented to person, place, and time.  Skin: Skin is warm and dry. No rash noted.  Psychiatric: She has a normal mood and affect.     ED Treatments / Results  Labs (all labs ordered are listed, but only abnormal results are  displayed) Labs Reviewed  COMPREHENSIVE METABOLIC PANEL - Abnormal; Notable for the following:       Result Value   Glucose, Bld 104 (*)    BUN 26 (*)    Creatinine, Ser 1.79 (*)    GFR calc non Af Amer 24 (*)    GFR calc Af Amer 28 (*)    All other components within normal limits  CBC WITH DIFFERENTIAL/PLATELET - Abnormal; Notable for the following:    Platelets 144 (*)    All other components within normal limits  URINALYSIS, ROUTINE W REFLEX MICROSCOPIC - Abnormal; Notable for the following:    Color, Urine STRAW (*)    All other components within normal limits  LIPASE, BLOOD    EKG  EKG Interpretation  Date/Time:  Sunday August 13 2017 15:02:07 EDT Ventricular Rate:  86 PR Interval:    QRS Duration: 107 QT Interval:  451 QTC Calculation: 540 R Axis:   58 Text Interpretation:  Ventricular-paced complexes No further rhythm analysis attempted due to paced rhythm Probable anterior infarct, age indeterminate Prolonged QT interval Baseline wander in lead(s) I III aVL since last tracing no significant change Confirmed by Malvin Johns [phone]) on 08/13/2017 3:31:29 PM       Radiology Ct Abdomen Pelvis Wo Contrast  Result Date: 08/13/2017 CLINICAL DATA:  Ct a/p wo, complaining of RUQ/epigastric pain, ( no oral or iv per MD) EXAM: CT ABDOMEN AND PELVIS WITHOUT CONTRAST TECHNIQUE: Multidetector CT imaging of the abdomen and pelvis was performed following the standard protocol without IV contrast. COMPARISON:  CT of the abdomen and pelvis 11/11/2016 and contrast-enhanced CT on 07/20/2012 FINDINGS: Lower chest: Patchy opacities are identified the lung bases bilaterally, some of which appear chronic. More focal airspace filling in the left lower lobe could represent early infectious infiltrate. The heart is enlarged. Pacing leads are  in place to the right atrium and right ventricle. There is extensive coronary artery disease. No pericardial effusion. Hepatobiliary: The liver is  diffusely high attenuation without focal mass. Status post cholecystectomy. Pancreas: Unremarkable. No pancreatic ductal dilatation or surrounding inflammatory changes. Spleen: Normal in size without focal abnormality. Adrenals/Urinary Tract: Adrenal glands are normal in appearance. Right kidney and ureter are unremarkable. There is stable appearance to left kidney over multiple prior exams. 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 There is mild dilatation of the upper pole moiety, also stable. No evidence for obstructing calculus. Stomach/Bowel: The stomach and small bowel loops are normal in appearance. There are a large number of colonic diverticula. No acute diverticulitis. There is a large amount of stool throughout loops of colon. The appendix is well seen and has a normal appearance. Vascular/Lymphatic: There is dense atherosclerotic calcification of the abdominal aorta not associated with aneurysm. No retroperitoneal or mesenteric adenopathy. Reproductive: The uterus is present. No adnexal mass. No free pelvic fluid. Other: Anterior abdominal wall is unremarkable. Musculoskeletal: Moderate mid lumbar spondylosis and scoliosis. No suspicious lytic or blastic lesions. IMPRESSION: 1. No acute abnormality of the abdomen or pelvis. 2. Airspace filling opacities in the left lower lobe could represent acute infection superimposed on chronic fibrosis. 3. Colonic diverticulosis without acute diverticulitis. 4. Moderate stool burden. 5. Cardiomegaly.  Extensive coronary artery disease. 6.  Aortic atherosclerosis.  (ICD10-I70.0) 7. High attenuation liver compatible with amiodarone therapy. 8. Status post cholecystectomy. 9. Stable appearance of left kidney. Electronically Signed   By: Nolon Nations M.D.   On: 08/13/2017 15:01    Procedures Procedures (including critical care time)  Medications Ordered in ED Medications  sodium chloride 0.9 %  bolus 500 mL (0 mLs Intravenous Stopped 08/13/17 1511)     Initial Impression / Assessment and Plan / ED Course  I have reviewed the triage vital signs and the nursing notes.  Pertinent labs & imaging results that were available during my care of the patient were reviewed by me and considered in my medical decision making (see chart for details).     Patient is a 83 year old female who presents with abdominal pain. CT scan doesn't show any acute abnormality. There is a question of pneumonia. Patient doesn't have any strong symptoms although she does report a slight worsening in her cough. She is not febrile. However given this I will go ahead and start doxycycline.She otherwise is well appearing. There is no signs of UTI. She is tolerating oral fluids. She was discharged home in good condition. She will make a follow-up appointment with her primary care physician within the next few days. Return precautions were given.  Final Clinical Impressions(s) / ED Diagnoses   Final diagnoses:  Right upper quadrant abdominal pain  Constipation, unspecified constipation type  Community acquired pneumonia, unspecified laterality    New Prescriptions New Prescriptions   DOXYCYCLINE (VIBRAMYCIN) 100 MG CAPSULE    Take 1 capsule (100 mg total) by mouth 2 (two) times daily. One po bid x 7 days     Malvin Johns, MD 08/13/17 [phone]

## 2017-08-13 NOTE — ED Notes (Signed)
Attempted report to facility.  Sent to voicemail.  Left voicemail.

## 2017-08-13 NOTE — ED Notes (Signed)
Patient able to ambulate independently  

## 2017-08-13 NOTE — ED Triage Notes (Signed)
Per EMS, patient from [facility] complaining of RUQ/epigastric pain.  States pain feels crampy and comes and goes.  Denies constipation or diarrhea.  Hx of CHF. Takes fluid pill.  EKG shows Afib.

## 2017-08-13 NOTE — ED Notes (Signed)
[facility] at bedside to transport patient 

## 2017-08-13 NOTE — ED Notes (Signed)
Pt provided with [place] sandwich bag and ginger ale, okay'd by MD.  MD at bedside updating patient and discussing follow-up care.

## 2017-08-16 NOTE — Progress Notes (Signed)
Chief Complaint  Patient presents with  . ER follow up    Pt does has a slight cough and some body chills, had some dyspnea    HPI: [name] 83 y.o. come in for  Follow-up ED visit for abdominal pain else be constipation.x-ray showed possibly early airspace disease in the lower lungs and was placed on antibiotics.  a follow-up was recommended in the office. Tabd pain  Off and on . She is now broke daily on regular medicines. On further questioning it appears she is getting MiraLAX 1 capful a day for constipation and then Senokot at night. In the recent past she had some diarrhea and her daughter gave her some Imodium. She is not taking it now. Her CT scan showed some increased stool burden and patient relates having lots of stooling after the exam. There is no vomiting does complain about some of the food [place]. She also is concerned because they're giving her 6 pills at a time to take this bothers her GI tract. Would rather take them intermittently. There is no new chest pain shortness of breath or swelling.  ROS: See pertinent positives and negatives per HPI. his is her for emergency room visits in the last 6 months and 3 hospital admissions. These are for various things such as chest pain abdominal pain. Fortunately no acute cardiac decompensation during these evaluations. recnetly  Past Medical History:  Diagnosis Date  . Anemia   . Atrial fibrillation ([place])    a. Dx 01/2014->Eliquis started.  . Cancer ([place])   . Chest pain    a. 2011 Neg MV;  b. 01/2014 Cath: LM 20-30, LAD nl, D1 nl, LCX nl, OM1 nl, RCA dom 30-55m, PD/PL nl, EF 65%->Med Rx; c. 03/2016 MV: no ischemia/infarct. EF 63%.  . Degenerative joint disease   . Diastolic CHF, acute on chronic ([place]) 01/31/2012  . Dyslipidemia   . Dysrhythmia   . Family history of adverse reaction to anesthesia    " multiple family members have difficulty waking "  . GERD (gastroesophageal reflux disease)   . History of skin  cancer    tafeen/ non melanoma.   Marland Kitchen Hx of varicella   . Hypertension   . Hypothyroidism   . Monoclonal gammopathies   . Pacemaker   . Palpitations    PVCs/bigeminy on event in May 2009 revealing this was relatively asymptomatic  . Right lower quadrant abdominal pain 07/19/2012   Recurrent  With nausea    This is new since she had ct for llq pain in [facility]    R/o appendiceal problem  Hernia  Get ct scan  And plan  Fu     . Thrombocytopenia ([place])     Family History  Problem Relation Age of Onset  . Coronary artery disease Father   . Heart disease Father   . Heart attack Father   . Alzheimer's disease Mother   . Lung cancer Brother 38  . Lung cancer Unknown     Social History   Social History  . Marital status: Widowed    Spouse name: N/A  . Number of children: 3  . Years of education: N/A   Occupational History  .  Retired    [employer], book keeping   Social History Main Topics  . Smoking status: Never Smoker  . Smokeless tobacco: Never Used  . Alcohol use No  . Drug use: No  . Sexual activity: No   Other Topics Concern  .  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 Eats with her.      Daughter Joseph Art handles medications. Sees HH and PT once a week.   Helper  Rolette  there Friday 1-3 pm   Pt lives alone, Joseph Art lives 30 min away.  05-15-17    Outpatient Medications Prior to Visit  Medication Sig Dispense Refill  . acetaminophen (TYLENOL) 325 MG tablet Take 650 mg by mouth every 4 (four) hours as needed (for pain).     Marland Kitchen amiodarone (PACERONE) 200 MG tablet Take 1 tablet (200 mg total) by mouth daily. 90 tablet 3  . baclofen (LIORESAL) 10 MG tablet Take 10 mg by mouth every 12 (twelve) hours as needed for muscle spasms.     . citalopram  (CELEXA) 10 MG tablet Take 10 mg by mouth daily.    Marland Kitchen doxycycline (VIBRAMYCIN) 100 MG capsule Take 1 capsule (100 mg total) by mouth 2 (two) times daily. One po bid x 7 days 14 capsule 0  . ELIQUIS 2.5 MG TABS tablet TAKE ONE TABLET BY MOUTH TWICE DAILY (Patient taking differently: Take 2.5 mg by mouth two times a day) 60 tablet 6  . Ferrous Sulfate (IRON) 325 (65 Fe) MG TABS Take 1 tablet by mouth daily. (Patient taking differently: Take 325 mg by mouth daily. ) 180 each 0  . lactobacillus acidophilus (BACID) TABS tablet Take 2 tablets by mouth 3 (three) times daily. 120 tablet 0  . levothyroxine (SYNTHROID, LEVOTHROID) 75 MCG tablet TAKE 1 TABLET BY MOUTH ONCE DAILY (Patient taking differently: Take 75 mcg by mouth once a day) 90 tablet 0  . lidocaine (LIDODERM) 5 % Place 2 patches onto the skin daily. Remove & Discard patch within 12 hours or as directed by MD 30 patch 0  . loratadine (CLARITIN) 10 MG tablet Take 1 tablet (10 mg total) by mouth daily. 30 tablet 0  . LORazepam (ATIVAN) 0.5 MG tablet Take 0.5 mg by mouth every 12 (twelve) hours as needed for anxiety.    . magnesium hydroxide (MILK OF MAGNESIA) 400 MG/5ML suspension Take 30 mLs by mouth daily as needed for mild constipation.    . meclizine (ANTIVERT) 12.5 MG tablet Take 1 tablet (12.5 mg total) by mouth 2 (two) times daily as needed for dizziness. 10 tablet 0  . metolazone (ZAROXOLYN) 2.5 MG tablet Take 1 tablet (2.5 mg total) by mouth as needed (for weight 134 lb or greater). (Patient taking differently: Take 2.5 mg by mouth daily as needed (if weight is 134 pounds or greater). ) 10 tablet 3  . nitroGLYCERIN (NITROSTAT) 0.4 MG SL tablet Place 1 tablet (0.4 mg total) under the tongue every 5 (five) minutes as needed. For chest pain (Patient taking differently: Place 0.4 mg under the tongue every 5 (five) minutes as needed for chest pain. ) 25 tablet 6  . pantoprazole (PROTONIX) 40 MG tablet Take 1 tablet (40 mg total) by mouth 2 (two)  times daily. 60 tablet 0  . polyethylene glycol (MIRALAX) packet Take 17 g by mouth daily. Mix with 8 ounces of liquid. 14 each 0  . potassium chloride SA (K-DUR,KLOR-CON) 20 MEQ tablet Take  1 tablet (20 mEq total) by mouth daily. Take an additional 60meq with Metolazone. (Patient taking differently: Take 20 mEq by mouth daily. ) 90 tablet 2  . senna (SENOKOT) 8.6 MG TABS tablet Take 2 tablets (17.2 mg total) by mouth at bedtime. 60 each 0  . simvastatin (ZOCOR) 10 MG tablet TAKE ONE TABLET BY MOUTH AT BEDTIME (Patient taking differently: Take 10 mg by mouth at bedtime) 30 tablet 3  . torsemide (DEMADEX) 20 MG tablet Take 1 tablet (20 mg total) by mouth daily. Please start on 11/22/2016 (Patient taking differently: Take 20 mg by mouth daily. ) 60 tablet 6  . bisacodyl (DULCOLAX) 10 MG suppository Place 1 suppository (10 mg total) rectally daily as needed for moderate constipation. (Patient not taking: Reported on 08/17/2017) 12 suppository 0   No facility-administered medications prior to visit.      EXAM:  BP 128/74 (BP Location: Right Arm, Patient Position: Sitting, Cuff Size: Normal)   Pulse 78   Temp 97.8 F (36.6 C) (Oral)   Ht 4\' 10"  (1.473 m)   Wt 121 lb 9.6 oz (55.2 kg)   SpO2 96%   BMI 25.41 kg/m   Body mass index is 25.41 kg/m.  GENERAL: vitals reviewed and listed above, alert, oriented, appears well hydrated and in no acute distressshe appears to be at baseline. Here with her daughter. HEENT: atraumatic, conjunctiva  clear, no obvious abnormalities on inspection of external nose and ears  NECK: no obvious masses on inspection palpation  LUNGS: clear to auscultation bilaterally, no wheezes, rales or rhonchi, she has kyphosis CV: HRRR, no clubbing cyanosis  nl cap refill  Abdomen no obvious masses guarding or rebound. MS: moves all extremities without noticeable focal  Abnormality djd no acute findings  PSYCH: pleasant and cooperative, nl conversation  Lab Results  Component  Value Date   WBC 5.7 08/13/2017   HGB 12.9 08/13/2017   HCT 40.4 08/13/2017   PLT 144 (L) 08/13/2017   GLUCOSE 104 (H) 08/13/2017   CHOL 178 05/28/2017   TRIG 42 05/28/2017   HDL 97 05/28/2017   LDLCALC 73 05/28/2017   ALT 16 08/13/2017   AST 22 08/13/2017   NA 139 08/13/2017   K 4.4 08/13/2017   CL 107 08/13/2017   CREATININE 1.79 (H) 08/13/2017   BUN 26 (H) 08/13/2017   CO2 25 08/13/2017   TSH 1.831 07/25/2017   INR 1.23 03/08/2017   HGBA1C 5.7 (H) 05/28/2017   BP Readings from Last 3 Encounters:  08/17/17 128/74  08/13/17 (!) 137/96  07/31/17 136/74   Wt Readings from Last 3 Encounters:  08/17/17 121 lb 9.6 oz (55.2 kg)  08/13/17 123 lb (55.8 kg)  07/31/17 123 lb 12.8 oz (56.2 kg)   Data reviewed as well as mar  ASSESSMENT AND PLAN:  Discussed the following assessment and plan:  Abdominal pain, unspecified abdominal location  Lung infection possible - lll pna  Abnormal computed tomography of lung  on abdominal ct   At risk for polypharmacy  Medication management  Chronic combined systolic and diastolic CHF (congestive heart failure) ([place])  Advanced age We'll change her MiraLAX to 3 days a week and then daily as needed. She's been getting MiraLAX every day which could be contributing to her intermittent diarrhea for which she then took Imodium. I suspect when she went to the emergency room she was more in the constipation mode. Discussed titration these medicines. Treatment for early pneumonia air space findings on her chest abdomen  CT scan. With no acute clinical findings. I suppose abdominal pain could be caused by an early pneumonia. Complete her antibiotic as planned. Unless she is getting increasing shortness of breath or fever no need for repeat chest x-ray at this time. Encouraged to use my chart if there are questions about medicines that we can help make better. Total visit 71mins > 50% spent counseling and coordinating care as indicated in above  note and in instructions to patient .  -Patient advised to return or notify health care team  if  new concerns arise.  Patient Instructions  Take the antibiotic as directed for possible early pneumonia. This medicine can make you nauseous but most people tolerate it well.  We'll change the order of MiraLAX to 3 days a week and daily as needed for constipation.  If you have concerns about your medicines contact your eyes so we can perhaps change the orders as appropriate Your cardiology team should do your cardiology medications however.   I am suspecting you're having alternating constipation diarrhea from different medicines. Hopefully we can get a regimen of medicine that evens things out. Do not use Imodium unless you discuss it with our team. Sometimes this causes secondary constipation.    Standley Brooking. Panosh M.D.

## 2017-08-17 ENCOUNTER — Ambulatory Visit (INDEPENDENT_AMBULATORY_CARE_PROVIDER_SITE_OTHER): Payer: Medicare Other | Admitting: Internal Medicine

## 2017-08-17 ENCOUNTER — Encounter: Payer: Self-pay | Admitting: Internal Medicine

## 2017-08-17 VITALS — BP 128/74 | HR 78 | Temp 97.8°F | Ht <= 58 in | Wt 121.6 lb

## 2017-08-17 DIAGNOSIS — R54 Age-related physical debility: Secondary | ICD-10-CM | POA: Diagnosis not present

## 2017-08-17 DIAGNOSIS — Z79899 Other long term (current) drug therapy: Secondary | ICD-10-CM | POA: Diagnosis not present

## 2017-08-17 DIAGNOSIS — Z9189 Other specified personal risk factors, not elsewhere classified: Secondary | ICD-10-CM

## 2017-08-17 DIAGNOSIS — R109 Unspecified abdominal pain: Secondary | ICD-10-CM

## 2017-08-17 DIAGNOSIS — R918 Other nonspecific abnormal finding of lung field: Secondary | ICD-10-CM | POA: Diagnosis not present

## 2017-08-17 DIAGNOSIS — I5042 Chronic combined systolic (congestive) and diastolic (congestive) heart failure: Secondary | ICD-10-CM

## 2017-08-17 DIAGNOSIS — J189 Pneumonia, unspecified organism: Secondary | ICD-10-CM | POA: Diagnosis not present

## 2017-08-17 NOTE — Patient Instructions (Signed)
Take the antibiotic as directed for possible early pneumonia. This medicine can make you nauseous but most people tolerate it well.  We'll change the order of MiraLAX to 3 days a week and daily as needed for constipation.  If you have concerns about your medicines contact your eyes so we can perhaps change the orders as appropriate Your cardiology team should do your cardiology medications however.   I am suspecting you're having alternating constipation diarrhea from different medicines. Hopefully we can get a regimen of medicine that evens things out. Do not use Imodium unless you discuss it with our team. Sometimes this causes secondary constipation.

## 2017-08-18 ENCOUNTER — Telehealth: Payer: Self-pay | Admitting: Physician Assistant

## 2017-08-18 NOTE — Telephone Encounter (Signed)
Transmission received.  Pacemaker is programmed VVIR 60 (reprogrammed on 07/25/17 per scanned report), sensing and impedance trends stable.  Battery longevity estimate is 10.7-11.0 years.  No episodes or abnormalities noted, Vp 55%.  No alerts delivered per device.  Spoke with [name] and advised her of findings.  She verbalizes understanding and denies questions or concerns at this time.

## 2017-08-18 NOTE — Telephone Encounter (Signed)
Received call to answering service one minute before office opened that patient's pacemaker is making noise. Spoke with device clinic who will contact patient to further discuss.  Jleigh Striplin PA-C

## 2017-08-18 NOTE — Telephone Encounter (Signed)
Attempted to reach patient.  Home number is disconnected (removed from chart).  No answer at daughter (POA)'s number, no VM set up.

## 2017-08-18 NOTE — Telephone Encounter (Signed)
Able to reach [facility]  No alerts received automatically via [place] monitor, last updated 08/16/17.  Attempted to assist with sending a manual transmission but was unsuccessful.  Zebedee Iba the phone number for [employer] support and requested that she contact them for further assistance.  Monitor was apparently unplugged this AM but has remained plugged in 24/7 otherwise.  Advised to keep monitor plugged in and [place] understanding.  Advised I will call back around noon if transmission is still not received and she verbalizes understanding.

## 2017-08-19 ENCOUNTER — Emergency Department (HOSPITAL_COMMUNITY): Payer: Medicare Other

## 2017-08-19 ENCOUNTER — Emergency Department (HOSPITAL_COMMUNITY)
Admission: EM | Admit: 2017-08-19 | Discharge: 2017-08-19 | Disposition: A | Discharge status: Home / Self Care | Payer: Medicare Other | Attending: Emergency Medicine | Admitting: Emergency Medicine

## 2017-08-19 ENCOUNTER — Encounter (HOSPITAL_COMMUNITY): Payer: Self-pay

## 2017-08-19 DIAGNOSIS — Z7901 Long term (current) use of anticoagulants: Secondary | ICD-10-CM | POA: Diagnosis not present

## 2017-08-19 DIAGNOSIS — N184 Chronic kidney disease, stage 4 (severe): Secondary | ICD-10-CM | POA: Diagnosis not present

## 2017-08-19 DIAGNOSIS — E039 Hypothyroidism, unspecified: Secondary | ICD-10-CM | POA: Diagnosis not present

## 2017-08-19 DIAGNOSIS — I130 Hypertensive heart and chronic kidney disease with heart failure and stage 1 through stage 4 chronic kidney disease, or unspecified chronic kidney disease: Secondary | ICD-10-CM | POA: Diagnosis not present

## 2017-08-19 DIAGNOSIS — I5032 Chronic diastolic (congestive) heart failure: Secondary | ICD-10-CM | POA: Diagnosis not present

## 2017-08-19 DIAGNOSIS — R079 Chest pain, unspecified: Secondary | ICD-10-CM | POA: Diagnosis not present

## 2017-08-19 DIAGNOSIS — Z859 Personal history of malignant neoplasm, unspecified: Secondary | ICD-10-CM | POA: Diagnosis not present

## 2017-08-19 DIAGNOSIS — Z85828 Personal history of other malignant neoplasm of skin: Secondary | ICD-10-CM | POA: Insufficient documentation

## 2017-08-19 DIAGNOSIS — R05 Cough: Secondary | ICD-10-CM | POA: Diagnosis not present

## 2017-08-19 DIAGNOSIS — R0789 Other chest pain: Secondary | ICD-10-CM

## 2017-08-19 DIAGNOSIS — Z79899 Other long term (current) drug therapy: Secondary | ICD-10-CM | POA: Insufficient documentation

## 2017-08-19 LAB — LIPASE, BLOOD: LIPASE: 32 U/L (ref 11–51)

## 2017-08-19 LAB — CBC
HCT: 40.5 % (ref 36.0–46.0)
Hemoglobin: 12.8 g/dL (ref 12.0–15.0)
MCH: 31.3 pg (ref 26.0–34.0)
MCHC: 31.6 g/dL (ref 30.0–36.0)
MCV: 99 fL (ref 78.0–100.0)
PLATELETS: 139 10*3/uL — AB (ref 150–400)
RBC: 4.09 MIL/uL (ref 3.87–5.11)
RDW: 15.7 % — ABNORMAL HIGH (ref 11.5–15.5)
WBC: 5 10*3/uL (ref 4.0–10.5)

## 2017-08-19 LAB — BASIC METABOLIC PANEL
Anion gap: 9 (ref 5–15)
BUN: 27 mg/dL — ABNORMAL HIGH (ref 6–20)
CALCIUM: 9.8 mg/dL (ref 8.9–10.3)
CO2: 28 mmol/L (ref 22–32)
CREATININE: 2.11 mg/dL — AB (ref 0.44–1.00)
Chloride: 105 mmol/L (ref 101–111)
GFR calc non Af Amer: 20 mL/min — ABNORMAL LOW (ref >60)
GFR, EST AFRICAN AMERICAN: 23 mL/min — AB (ref >60)
Glucose, Bld: 109 mg/dL — ABNORMAL HIGH (ref 65–99)
Potassium: 4 mmol/L (ref 3.5–5.1)
SODIUM: 142 mmol/L (ref 135–145)

## 2017-08-19 LAB — HEPATIC FUNCTION PANEL
ALBUMIN: 3.3 g/dL — AB (ref 3.5–5.0)
ALT: 17 U/L (ref 14–54)
AST: 22 U/L (ref 15–41)
Alkaline Phosphatase: 95 U/L (ref 38–126)
BILIRUBIN TOTAL: 1 mg/dL (ref 0.3–1.2)
Bilirubin, Direct: 0.2 mg/dL (ref 0.1–0.5)
Indirect Bilirubin: 0.8 mg/dL (ref 0.3–0.9)
TOTAL PROTEIN: 6.6 g/dL (ref 6.5–8.1)

## 2017-08-19 LAB — I-STAT TROPONIN, ED
TROPONIN I, POC: 0 ng/mL (ref 0.00–0.08)
Troponin i, poc: 0.01 ng/mL (ref 0.00–0.08)

## 2017-08-19 LAB — MAGNESIUM: MAGNESIUM: 2.1 mg/dL (ref 1.7–2.4)

## 2017-08-19 MED ORDER — GI COCKTAIL ~~LOC~~
30.0000 mL | Freq: Once | ORAL | Status: AC
  Administered 2017-08-19: 30 mL via ORAL
  Filled 2017-08-19: qty 30

## 2017-08-19 MED ORDER — SODIUM CHLORIDE 0.9 % IV BOLUS (SEPSIS): 500.0000 mL | Freq: Once | INTRAVENOUS | Status: DC

## 2017-08-19 MED ORDER — SODIUM CHLORIDE 0.9 % IV BOLUS (SEPSIS)
250.0000 mL | Freq: Once | INTRAVENOUS | Status: AC
  Administered 2017-08-19: 250 mL via INTRAVENOUS

## 2017-08-19 MED ORDER — ACETAMINOPHEN 500 MG PO TABS
1000.0000 mg | ORAL_TABLET | Freq: Once | ORAL | Status: AC
  Administered 2017-08-19: 1000 mg via ORAL
  Filled 2017-08-19: qty 2

## 2017-08-19 MED ORDER — PANTOPRAZOLE SODIUM 40 MG PO TBEC: 40.0000 mg | DELAYED_RELEASE_TABLET | Freq: Every day | ORAL | 0 refills | Status: AC

## 2017-08-19 NOTE — ED Triage Notes (Signed)
Pt arrives to ED from [facility] with complaints of central chest pain since yesterday that is worse with palpation. Pt endorses cough. Pt took 1 NTG with no relief. She states she was dx with pneumonia 9/30.

## 2017-08-19 NOTE — ED Provider Notes (Signed)
[place] DEPT Provider Note   CSN: [id number] Arrival date & time: 08/19/17  1037     History   Chief Complaint Chief Complaint  Patient presents with  . Chest Pain    HPI [name] is a 83 y.o. female.  HPI   82 yo F with PMHx of AFib, HTN, HLD, MGUS, recurrent CP here with chest pain. Pt reports that she has been coughing for the last week after recent dx of PNA. She has finished her course of ABX. She reports associated 1 day of sharp, left sided chest pain. Pain is worse with palpation. No worsening SOB and her cough is improving. No fevers, no chills. No sputum production. No hemoptysis or leg swelling. She also reports intermittent mild nausea, and notes that her recurrent CP has started since she was started on "more pills" by her facility. She has been on protonix w/o significant improvement however. Sx do seem to worsen mildly when lying flat. She has not been eating/drinking much due to poor appetite. No urinary sx.  Past Medical History:  Diagnosis Date  . Anemia   . Atrial fibrillation ([place])    a. Dx 01/2014->Eliquis started.  . Cancer ([place])   . Chest pain    a. 2011 Neg MV;  b. 01/2014 Cath: LM 20-30, LAD nl, D1 nl, LCX nl, OM1 nl, RCA dom 30-33m, PD/PL nl, EF 65%->Med Rx; c. 03/2016 MV: no ischemia/infarct. EF 63%.  . Degenerative joint disease   . Diastolic CHF, acute on chronic ([place]) 01/31/2012  . Dyslipidemia   . Dysrhythmia   . Family history of adverse reaction to anesthesia    " multiple family members have difficulty waking "  . GERD (gastroesophageal reflux disease)   . History of skin cancer    tafeen/ non melanoma.   Marland Kitchen Hx of varicella   . Hypertension   . Hypothyroidism   . Monoclonal gammopathies   . Pacemaker   . Palpitations    PVCs/bigeminy on event in May 2009 revealing this was relatively asymptomatic  . Right lower quadrant abdominal pain 07/19/2012   Recurrent  With nausea    This is new since she had ct for llq pain in [facility]     R/o appendiceal problem  Hernia  Get ct scan  And plan  Fu     . Thrombocytopenia [facility])     Patient Active Problem List   Diagnosis Date Noted  . CAD (coronary artery disease) 07/24/2017  . Chronic combined systolic and diastolic CHF, NYHA class 2 ([place]) 07/24/2017  . CKD (chronic kidney disease), stage IV ([place]) 07/24/2017  . Dementia 07/24/2017  . Atrial fibrillation, chronic ([place]) 07/24/2017  . Monoclonal gammopathy of unknown significance (MGUS) 07/24/2017  . Thrombocytopenia, idiopathic ([place]) 07/24/2017  . Pulmonary HTN ([place]) 07/24/2017  . Mild mitral regurgitation 07/24/2017  . Idiopathic hypotension   . Acute urinary retention 06/01/2017  . Acute on chronic diastolic (congestive) heart failure ([place]) 05/27/2017  . Fall 05/27/2017  . Acute metabolic encephalopathy 16/11/930  . Mitral regurgitation 03/10/2017  . Mitral stenosis 03/10/2017  . Chest pain 03/08/2017  . Dehydration 11/18/2016  . Intractable nausea and vomiting 11/18/2016  . Generalized weakness 11/18/2016  . Acute kidney injury superimposed on chronic kidney disease ([place]) 11/11/2016  . Hypokalemia 11/11/2016  . Hypertensive heart disease 04/01/2016  . PAF (paroxysmal atrial fibrillation) ([place]) 03/31/2016  . Chest pain with moderate risk for cardiac etiology, negative MI and Nuc study, + muscular skeletal   .  Atypical chest pain 01/21/2016  . CKD (chronic kidney disease) stage 4, GFR 15-29 ml/min (HCC) 05/09/2015  . Anemia of chronic disease 05/09/2015  . Dyslipidemia 05/09/2015  . Elevated troponin   . Iron deficiency anemia 11/21/2014  . Pacemaker 04/27/2014  . Chronic anticoagulation 02/25/2014  . Chronic diastolic CHF (congestive heart failure) ([place]) 02/24/2014  . Atrial fibrillation ([place]) 01/29/2014  . MGUS (monoclonal gammopathy of unknown significance) 08/01/2013  . Thrombocytopenia ([place])   . Hypothyroidism 02/19/2012  . Hyperlipidemia 12/15/2008  . HYPERTENSION, BENIGN 12/15/2008    Past Surgical  History:  Procedure Laterality Date  . CARDIOVERSION N/A 02/26/2014   Procedure: CARDIOVERSION AT BEDSIDE;  Surgeon: Pixie Casino, MD;  Location: [place];  Service: Cardiovascular;  Laterality: N/A;  . CARDIOVERSION N/A 04/22/2014   Procedure: CARDIOVERSION (BEDSIDE);  Surgeon: [name], MD;  Location: [facility] OR;  Service: Cardiovascular;  Laterality: N/A;  . CATARACT EXTRACTION     Bilateral  implantt  . CESAREAN SECTION     times 2  . CHOLECYSTECTOMY  1999  . ESOPHAGOGASTRODUODENOSCOPY N/A 07/06/2014   Procedure: ESOPHAGOGASTRODUODENOSCOPY (EGD);  Surgeon: Ladene Artist, MD;  Location: [facility] ENDOSCOPY;  Service: Endoscopy;  Laterality: N/A;  . INSERT / REPLACE / REMOVE PACEMAKER    . LEFT HEART CATHETERIZATION WITH CORONARY ANGIOGRAM N/A 01/29/2014   Procedure: LEFT HEART CATHETERIZATION WITH CORONARY ANGIOGRAM;  Surgeon: Blane Ohara, MD;  Location[facility] CATH LAB;  Service: Cardiovascular;  Laterality: N/A;  . PACEMAKER INSERTION  04/23/2014   STJ Assurity dual chamber pacemaker implanted by Dr Rayann Heman  . PERMANENT PACEMAKER INSERTION N/A 04/23/2014   Procedure: PERMANENT PACEMAKER INSERTION;  Surgeon: Evans Lance, MD;  Location: [facility] CATH LAB;  Service: Cardiovascular;  Laterality: N/A;  . RIGHT HEART CATHETERIZATION N/A 09/22/2014   Procedure: RIGHT HEART CATH;  Surgeon: Jolaine Artist, MD;  Location: [facility] CATH LAB;  Service: Cardiovascular;  Laterality: N/A;  . SHOULDER SURGERY  1996   Right     OB History    No data available       Home Medications    Prior to Admission medications   Medication Sig Start Date End Date Taking? Authorizing Provider  acetaminophen (TYLENOL) 325 MG tablet Take 650 mg by mouth every 4 (four) hours as needed (for pain).    Yes [provider]  amiodarone (PACERONE) 200 MG tablet Take 1 tablet (200 mg total) by mouth daily. 11/25/16  Yes Bensimhon, Shaune Pascal, MD  baclofen (LIORESAL) 10 MG tablet Take 10 mg by mouth every 12 (twelve) hours  as needed for muscle spasms.    Yes [provider]  citalopram (CELEXA) 10 MG tablet Take 10 mg by mouth daily.   Yes [provider]  ELIQUIS 2.5 MG TABS tablet TAKE ONE TABLET BY MOUTH TWICE DAILY Patient taking differently: Take 2.5 mg by mouth two times a day 05/22/17  Yes Bensimhon, Shaune Pascal, MD  Ferrous Sulfate (IRON) 325 (65 Fe) MG TABS Take 1 tablet by mouth daily. Patient taking differently: Take 325 mg by mouth daily.  07/01/16  Yes Johnson, Clanford L, MD  levothyroxine (SYNTHROID, LEVOTHROID) 75 MCG tablet TAKE 1 TABLET BY MOUTH ONCE DAILY Patient taking differently: Take 75 mcg by mouth once a day 05/22/17  Yes Panosh, Standley Brooking, MD  lidocaine (LIDODERM) 5 % Place 2 patches onto the skin daily. Remove & Discard patch within 12 hours or as directed by MD 07/27/17  Yes Geradine Girt, DO  loratadine (  CLARITIN) 10 MG tablet Take 1 tablet (10 mg total) by mouth daily. 03/10/17  Yes Jani Gravel, MD  LORazepam (ATIVAN) 0.5 MG tablet Take 0.5 mg by mouth every 12 (twelve) hours as needed for anxiety.   Yes [provider]  magnesium hydroxide (MILK OF MAGNESIA) 400 MG/5ML suspension Take 30 mLs by mouth daily as needed for mild constipation.   Yes [provider]  meclizine (ANTIVERT) 12.5 MG tablet Take 1 tablet (12.5 mg total) by mouth 2 (two) times daily as needed for dizziness. 09/14/16  Yes Bensimhon, Shaune Pascal, MD  metolazone (ZAROXOLYN) 2.5 MG tablet Take 1 tablet (2.5 mg total) by mouth as needed (for weight 134 lb or greater). Patient taking differently: Take 2.5 mg by mouth daily as needed (if weight is 134 pounds or greater).  03/09/16  Yes Bensimhon, Shaune Pascal, MD  neomycin-bacitracin-polymyxin (NEOSPORIN) OINT Apply 1 application topically 2 (two) times daily. TO LEFT GREAT TOE FOR 7 DAYS 08/18/17 08/24/17 Yes [provider]  nitroGLYCERIN (NITROSTAT) 0.4 MG SL tablet Place 1 tablet (0.4 mg total) under the tongue every 5 (five) minutes as needed.  For chest pain Patient taking differently: Place 0.4 mg under the tongue every 5 (five) minutes as needed for chest pain.  05/06/16  Yes Bensimhon, Shaune Pascal, MD  polyethylene glycol [facility]) packet Take 17 g by mouth daily. Mix with 8 ounces of liquid. Patient taking differently: Take 17 g by mouth 3 (three) times a week. Monday, Wednesday, Friday.  Mix with 8 ounces of liquid. 08/13/17  Yes Malvin Johns, MD  potassium chloride SA (K-DUR,KLOR-CON) 20 MEQ tablet Take 1 tablet (20 mEq total) by mouth daily. Take an additional 29meq with Metolazone. Patient taking differently: Take 20 mEq by mouth daily.  06/09/17  Yes Pattricia Boss, MD  simvastatin (ZOCOR) 10 MG tablet TAKE ONE TABLET BY MOUTH AT BEDTIME Patient taking differently: Take 10 mg by mouth at bedtime 02/14/17  Yes Larey Dresser, MD  torsemide (DEMADEX) 20 MG tablet Take 1 tablet (20 mg total) by mouth daily. Please start on 11/22/2016 Patient taking differently: Take 20 mg by mouth daily.  06/09/17  Yes Pattricia Boss, MD  doxycycline (VIBRAMYCIN) 100 MG capsule Take 1 capsule (100 mg total) by mouth 2 (two) times daily. One po bid x 7 days 08/13/17   Malvin Johns, MD  pantoprazole (PROTONIX) 40 MG tablet Take 1 tablet (40 mg total) by mouth daily. 08/19/17   Duffy Bruce, MD    Family History Family History  Problem Relation Age of Onset  . Coronary artery disease Father   . Heart disease Father   . Heart attack Father   . Alzheimer's disease Mother   . Lung cancer Brother 55  . Lung cancer Unknown     Social History Social History  Substance Use Topics  . Smoking status: Never Smoker  . Smokeless tobacco: Never Used  . Alcohol use No     Allergies   Other; Tramadol; and Ibuprofen   Review of Systems Review of Systems  Constitutional: Positive for fatigue.  Cardiovascular: Positive for chest pain.  Gastrointestinal: Positive for nausea.  All other systems reviewed and are negative.    Physical Exam Updated  Vital Signs BP (!) 142/61   Pulse 80   Temp 97.6 F (36.4 C) (Oral)   Resp 18   Wt 54.9 kg (121 lb)   SpO2 97%   BMI 25.29 kg/m   Physical Exam  Constitutional: She is oriented to  person, place, and time. She appears well-developed and well-nourished. No distress.  HENT:  Head: Normocephalic and atraumatic.  Eyes: Conjunctivae are normal.  Neck: Neck supple.  Cardiovascular: Normal rate, regular rhythm and normal heart sounds.  Exam reveals no friction rub.   No murmur heard. Pulmonary/Chest: Effort normal and breath sounds normal. No respiratory distress. She has no wheezes. She has no rales. She exhibits tenderness (pinpoint TTP over left anterior intercostal areas).  Abdominal: She exhibits no distension.  Musculoskeletal: She exhibits no edema.  Neurological: She is alert and oriented to person, place, and time. She exhibits normal muscle tone.  Skin: Skin is warm. Capillary refill takes less than 2 seconds.  Psychiatric: She has a normal mood and affect.  Nursing note and vitals reviewed.    ED Treatments / Results  Labs (all labs ordered are listed, but only abnormal results are displayed) Labs Reviewed  BASIC METABOLIC PANEL - Abnormal; Notable for the following:       Result Value   Glucose, Bld 109 (*)    BUN 27 (*)    Creatinine, Ser 2.11 (*)    GFR calc non Af Amer 20 (*)    GFR calc Af Amer 23 (*)    All other components within normal limits  CBC - Abnormal; Notable for the following:    RDW 15.7 (*)    Platelets 139 (*)    All other components within normal limits  HEPATIC FUNCTION PANEL - Abnormal; Notable for the following:    Albumin 3.3 (*)    All other components within normal limits  LIPASE, BLOOD  MAGNESIUM  I-STAT TROPONIN, ED  I-STAT TROPONIN, ED    EKG  EKG Interpretation  Date/Time:  Saturday August 19 2017 10:37:50 EDT Ventricular Rate:  74 PR Interval:    QRS Duration: 105 QT Interval:  469 QTC Calculation: 503 R  Axis:   8 Text Interpretation:  Ventricular-paced complexes No further rhythm analysis attempted due to paced rhythm Prolonged PR interval Probable left atrial enlargement Nonspecific repol abnormality, diffuse leads Prolonged QT interval No significant change since last tracing Confirmed by Duffy Bruce [phone]) on 08/19/2017 10:43:23 AM       Radiology Dg Chest 2 View  Result Date: 08/19/2017 CLINICAL DATA:  Chest pain. EXAM: CHEST  2 VIEW COMPARISON:  Chest x-ray dated July 24, 2017. FINDINGS: Right chest wall AICD in unchanged position. The cardiomediastinal silhouette is at the upper limits of normal in size. Normal pulmonary vascularity. Atherosclerotic calcification of the aortic arch. Peripheral density in the left mid lung is without correlate on the lateral view and likely represents overlapping soft tissue. The right lung is clear. No consolidation, pneumothorax or pleural effusion. No acute osseous abnormality. IMPRESSION: No active cardiopulmonary disease. Electronically Signed   By: Titus Dubin M.D.   On: 08/19/2017 12:38    Procedures Procedures (including critical care time)  Medications Ordered in ED Medications  gi cocktail (Maalox,Lidocaine,Donnatal) (30 mLs Oral Given 08/19/17 1240)  acetaminophen (TYLENOL) tablet 1,000 mg (1,000 mg Oral Given 08/19/17 1351)  sodium chloride 0.9 % bolus 250 mL (0 mLs Intravenous Stopped 08/19/17 1413)     Initial Impression / Assessment and Plan / ED Course  I have reviewed the triage vital signs and the nursing notes.  Pertinent labs & imaging results that were available during my care of the patient were reviewed by me and considered in my medical decision making (see chart for details).     83 yo F  with PMHx as above here with ongoing chest pain. History of multiple ED visits for same. Sx worsen after taking pills and seemed to have worsened after taking her recent abx for CAP. Pain also positional and reproducible on  exam.  Suspect non-cardiac etiology of CP. She has had extensive cardiac w/u with serial neg trops, neg stress test just over a year ago, and no known significant CAD. I suspect that today's pain is 2/2 MSK pain from coughing. CXR shows no residual PNA and pt not hypoxic, well appearing, and doubt worsening PNA. She has no LE edema, no tachycardia, no hypoxia, no s/s PE. Pain may also be 2/2 esophagitis/gatritis for which pt is on appropriate tx. Serial trops negative here. Pain is not c/w dissection. Discussion d/c versus obs with rule out with pt and based on shared decision making, pt would prefer to continue to manage at home. Declines admission at this time.  Otherwise, pt does have a mild increase in Cr. She is dry clinically. Pt given 250 cc fluid here. I discussed the case with Dr. Acie Fredrickson of Cardiology, as pt sees Dr. Sung Amabile and is on aggressive diuretic regimen. We reviewed case, labs. He agrees that ACS/ischemia unlikely and agrees with plan to hold metolazone, encourage PO fluids and f/u with Cards this Monday.   I had a long discussion with pt. She is very concerned about the # of pills she is on and feels this is causing her pain. Given that there may be a component of esophagitis, this may be the case. Will have her consolidate her bowel regimen, adjust her PPI usage to reduce frequency of pills.   Final Clinical Impressions(s) / ED Diagnoses   Final diagnoses:  Atypical chest pain    New Prescriptions Discharge Medication List as of 08/19/2017  3:30 PM       Duffy Bruce, MD 08/20/17 [phone]

## 2017-08-19 NOTE — Discharge Instructions (Signed)
To help reduce the number of pills [name] is taking, I advise the following:  - Start taking protonix only ONCE DAILY in the morning, instead of twice a day - Stop the Senna twice daily, and replace this with COLACE once daily in the morning - I would recommend STOPPING the Baclofen twice a day - You can also stop the Lactobacillus if the patient is no longer having loose stools  Give TYLENOL 650 MG every 4-6 hours as needed for chest pain  [name]'s lab work today showed a mild increase in her CREATININE. She was given a small amount of fluids. I recommend calling her Cardiologist on Monday to discuss possible medication changes.  Have repeat labs in the next 1 week.

## 2017-08-19 NOTE — ED Notes (Signed)
PT[facility] contacted for tx back to [facility] @ 1554

## 2017-08-19 NOTE — ED Notes (Signed)
Patient transported to X-ray 

## 2017-08-19 NOTE — ED Notes (Signed)
Pt verbalized understanding of d/c instructions and has no further questions. Report was attempted twice to [facility] but no one answered. Pt is alert and oriented x 4 and understands instructions. Pt d/c via PTAR.

## 2017-08-21 ENCOUNTER — Telehealth: Payer: Self-pay

## 2017-08-21 NOTE — Telephone Encounter (Signed)
Call the podiatrist  Team first  For advice   First    And see if you can get details of the problem for the foot doctor  Or  Staff to  Look and talk with you .

## 2017-08-21 NOTE — Telephone Encounter (Signed)
Pt's daughter called to report that pt had her nails clipped by a podiatrist. She now reports that one of the nail beds seems to be infected. The daughter does not know which foor/toe. She states that the pt has been complaining she does not feel well. They would like to know what you recommend.   Dr. Regis Bill - Please advise. Thanks!

## 2017-08-21 NOTE — Telephone Encounter (Signed)
Called [facility] and left message for [place] to call back with details.

## 2017-08-23 ENCOUNTER — Ambulatory Visit (INDEPENDENT_AMBULATORY_CARE_PROVIDER_SITE_OTHER): Payer: Medicare Other | Admitting: *Deleted

## 2017-08-23 DIAGNOSIS — I480 Paroxysmal atrial fibrillation: Secondary | ICD-10-CM | POA: Diagnosis not present

## 2017-08-23 NOTE — Progress Notes (Signed)
Remote pacemaker transmission.   

## 2017-08-25 ENCOUNTER — Encounter: Payer: Self-pay | Admitting: Cardiology

## 2017-08-29 ENCOUNTER — Inpatient Hospital Stay (HOSPITAL_COMMUNITY): Payer: Medicare Other

## 2017-08-29 ENCOUNTER — Encounter (HOSPITAL_COMMUNITY): Payer: Self-pay | Admitting: *Deleted

## 2017-08-29 ENCOUNTER — Inpatient Hospital Stay (HOSPITAL_COMMUNITY)
Admission: AD | Admit: 2017-08-29 | Discharge: 2017-08-29 | Disposition: A | Discharge status: Skilled Nursing Facility | Payer: Medicare Other | Source: Ambulatory Visit | Attending: Family Medicine | Admitting: Family Medicine

## 2017-08-29 DIAGNOSIS — E039 Hypothyroidism, unspecified: Secondary | ICD-10-CM | POA: Insufficient documentation

## 2017-08-29 DIAGNOSIS — D696 Thrombocytopenia, unspecified: Secondary | ICD-10-CM | POA: Diagnosis not present

## 2017-08-29 DIAGNOSIS — N920 Excessive and frequent menstruation with regular cycle: Secondary | ICD-10-CM | POA: Insufficient documentation

## 2017-08-29 DIAGNOSIS — R58 Hemorrhage, not elsewhere classified: Secondary | ICD-10-CM | POA: Diagnosis not present

## 2017-08-29 DIAGNOSIS — I4891 Unspecified atrial fibrillation: Secondary | ICD-10-CM | POA: Diagnosis not present

## 2017-08-29 HISTORY — DX: Unspecified hemorrhoids: K64.9

## 2017-08-29 LAB — CBC
HEMATOCRIT: 39.3 % (ref 36.0–46.0)
HEMOGLOBIN: 12.6 g/dL (ref 12.0–15.0)
MCH: 31.7 pg (ref 26.0–34.0)
MCHC: 32.1 g/dL (ref 30.0–36.0)
MCV: 99 fL (ref 78.0–100.0)
Platelets: 120 10*3/uL — ABNORMAL LOW (ref 150–400)
RBC: 3.97 MIL/uL (ref 3.87–5.11)
RDW: 15.2 % (ref 11.5–15.5)
WBC: 4.7 10*3/uL (ref 4.0–10.5)

## 2017-08-29 NOTE — Progress Notes (Signed)
Hansel Feinstein CNM in to discuss test results and d/c plan with pt. Written and verbal d/c instructions given and understanding voiced.

## 2017-08-29 NOTE — Discharge Instructions (Signed)
Rectal Bleeding Rectal bleeding is when blood passes out of the anus. People with rectal bleeding may notice bright red blood in their underwear or in the toilet after having a bowel movement. They may also have dark red or black stools. Rectal bleeding is usually a sign that something is wrong. Many things can cause rectal bleeding, including:  Hemorrhoids. These are blood vessels in the anus or rectum that are larger than normal.  Fistulas. These are abnormal passages in the rectum and anus.  Anal fissures. This is a tear in the anus.  Diverticulosis. This is a condition in which pockets or sacs project from the bowel.  Proctitis and colitis. These are conditions in which the rectum, colon, or anus become inflamed.  Polyps. These are growths that can be cancerous (malignant) or non-cancerous (benign).  Part of the rectum sticking out from the anus (rectal prolapse).  Certain medicines.  Intestinal infections.  Follow these instructions at home: Pay attention to any changes in your symptoms. Take these actions to help lessen bleeding and discomfort:  Eat a diet that is high in fiber. This will keep your stool soft, making it easier to pass stools without straining. Ask your health care provider what foods and drinks are high in fiber.  Drink enough fluid to keep your urine clear or pale yellow. This also helps to keep your stool soft.  Try taking a warm bath. This may help soothe any pain in your rectum.  Keep all follow-up visits as told by your health care provider. This is important.  Get help right away if:  You have new or increased rectal bleeding.  You have black or dark red stools.  You vomit blood or something that looks like coffee grounds.  You have pain or tenderness in your abdomen.  You have a fever.  You feel weak.  You feel nauseous.  You faint.  You have severe pain in your rectum.  You cannot have a bowel movement. This information is not  intended to replace advice given to you by your health care provider. Make sure you discuss any questions you have with your health care provider. Document Released: 04/22/2002 Document Revised: 04/07/2016 Document Reviewed: 12/27/2015 Elsevier Interactive Patient Education  2018 [employer]  

## 2017-08-29 NOTE — Progress Notes (Signed)
Marie Williams CNM in to discuss test results and d/c plan. Written and verbal d/c instructions given and understanding voiced.  

## 2017-08-29 NOTE — MAU Provider Note (Signed)
Chief Complaint:  Vaginal Bleeding   First Provider Initiated Contact with Patient 08/29/17 1643     HPI: [name] is a 83 y.o. G3P3000 who presents to maternity admissions via EMS who reportedly spontaneously started bleeding.  Got up and felt wetness, thought it was urine, but it was blood   Filled two pads.  No pain anywhere. No history of GYN problems, except menorrhagia when she was still menstruating. NO postmenopausal bleeding.  No recent diarrhea or contstipation , states "they have me regulated".  Is on Eliquis for Afib.  Is a DNR. Has CHF also.   . She reports vaginal bleeding, but no vaginal itching/burning, urinary symptoms, h/a, dizziness, n/v, or fever/chills.    Patient is an excellent historian.  Vaginal Bleeding  The patient's primary symptoms include vaginal bleeding (possibly rectal). The patient's pertinent negatives include no genital itching, genital lesions, genital odor or pelvic pain. This is a new problem. The current episode started today. The problem occurs intermittently. The problem has been resolved. The patient is experiencing no pain. She is not pregnant. Pertinent negatives include no abdominal pain, back pain, constipation, diarrhea, dysuria, fever, headaches, nausea or vomiting. The vaginal discharge was bloody. There has been no bleeding. She has not been passing clots. She has not been passing tissue. She is not sexually active. She is postmenopausal.    RN note: Pt came EMS from [facility] where she is a resident. About an hour ago got up and felt something run down her legs. Went to [facility] and noticed she was having vag bleeding. NO pain.  Past Medical History: Past Medical History:  Diagnosis Date  . Anemia   . Atrial fibrillation ([place])    a. Dx 01/2014->Eliquis started.  . Cancer ([place])   . Chest pain    a. 2011 Neg MV;  b. 01/2014 Cath: LM 20-30, LAD nl, D1 nl, LCX nl, OM1 nl, RCA dom 30-40m, PD/PL nl, EF 65%->Med Rx; c. 03/2016 MV: no  ischemia/infarct. EF 63%.  . Degenerative joint disease   . Diastolic CHF, acute on chronic ([place]) 01/31/2012  . Dyslipidemia   . Dysrhythmia   . Family history of adverse reaction to anesthesia    " multiple family members have difficulty waking "  . GERD (gastroesophageal reflux disease)   . Hemorrhoid   . History of skin cancer    tafeen/ non melanoma.   Marland Kitchen Hx of varicella   . Hypertension   . Hypothyroidism   . Monoclonal gammopathies   . Pacemaker   . Palpitations    PVCs/bigeminy on event in May 2009 revealing this was relatively asymptomatic  . Right lower quadrant abdominal pain 07/19/2012   Recurrent  With nausea    This is new since she had ct for llq pain in [facility]    R/o appendiceal problem  Hernia  Get ct scan  And plan  Fu     . Thrombocytopenia ([place])     Past obstetric history: OB History  Gravida Para Term Preterm AB Living  3 3 3         SAB TAB Ectopic Multiple Live Births               # Outcome Date GA Lbr Len/2nd Weight Sex Delivery Anes PTL Lv  3 Term 11/09/61    F Vag-Spont     2 Term 01/24/59     Vag-Spont     1 Term 08/03/56    F CS-LTranv  Past Surgical History: Past Surgical History:  Procedure Laterality Date  . CARDIOVERSION N/A 02/26/2014   Procedure: CARDIOVERSION AT BEDSIDE;  Surgeon: Pixie Casino, MD;  Location: [place];  Service: Cardiovascular;  Laterality: N/A;  . CARDIOVERSION N/A 04/22/2014   Procedure: CARDIOVERSION (BEDSIDE);  Surgeon: [name], MD;  Location: [facility] OR;  Service: Cardiovascular;  Laterality: N/A;  . CATARACT EXTRACTION     Bilateral  implantt  . CESAREAN SECTION     times 2  . CHOLECYSTECTOMY  1999  . ESOPHAGOGASTRODUODENOSCOPY N/A 07/06/2014   Procedure: ESOPHAGOGASTRODUODENOSCOPY (EGD);  Surgeon: Ladene Artist, MD;  Location: [facility] ENDOSCOPY;  Service: Endoscopy;  Laterality: N/A;  . INSERT / REPLACE / REMOVE PACEMAKER    . LEFT HEART CATHETERIZATION WITH CORONARY ANGIOGRAM N/A 01/29/2014   Procedure: LEFT  HEART CATHETERIZATION WITH CORONARY ANGIOGRAM;  Surgeon: Blane Ohara, MD;  Location: [facility] CATH LAB;  Service: Cardiovascular;  Laterality: N/A;  . PACEMAKER INSERTION  04/23/2014   STJ Assurity dual chamber pacemaker implanted by Dr Rayann Heman  . PERMANENT PACEMAKER INSERTION N/A 04/23/2014   Procedure: PERMANENT PACEMAKER INSERTION;  Surgeon: Evans Lance, MD;  Location: [facility] CATH LAB;  Service: Cardiovascular;  Laterality: N/A;  . RIGHT HEART CATHETERIZATION N/A 09/22/2014   Procedure: RIGHT HEART CATH;  Surgeon: Jolaine Artist, MD;  Location: [facility] CATH LAB;  Service: Cardiovascular;  Laterality: N/A;  . SHOULDER SURGERY  1996   Right     Family History: Family History  Problem Relation Age of Onset  . Coronary artery disease Father   . Heart disease Father   . Heart attack Father   . Alzheimer's disease Mother   . Lung cancer Brother 54  . Lung cancer Unknown     Social History: Social History  Substance Use Topics  . Smoking status: Never Smoker  . Smokeless tobacco: Never Used  . Alcohol use No    Allergies:  Allergies  Allergen Reactions  . Other Shortness Of Breath    Detergents, perfumes and other strong, odor-emitting compounds  . Tramadol Shortness Of Breath and Nausea Only  . Ibuprofen Other (See Comments)    Listed as an allergy on MAR    Meds:  Prescriptions Prior to Admission  Medication Sig Dispense Refill Last Dose  . acetaminophen (TYLENOL) 325 MG tablet Take 650 mg by mouth every 4 (four) hours as needed (for pain).    Past Week at Unknown time  . amiodarone (PACERONE) 200 MG tablet Take 1 tablet (200 mg total) by mouth daily. 90 tablet 3 08/29/2017 at Unknown time  . citalopram (CELEXA) 10 MG tablet Take 10 mg by mouth daily.   08/29/2017 at Unknown time  . docusate sodium (COLACE) 50 MG capsule Take 50 mg by mouth daily.   08/29/2017 at Unknown time  . ELIQUIS 2.5 MG TABS tablet TAKE ONE TABLET BY MOUTH TWICE DAILY (Patient taking differently: Take 2.5  mg by mouth two times a day) 60 tablet 6 08/29/2017 at 0800  . Ferrous Sulfate (IRON) 325 (65 Fe) MG TABS Take 1 tablet by mouth daily. 180 each 0 08/29/2017 at Unknown time  . levothyroxine (SYNTHROID, LEVOTHROID) 75 MCG tablet TAKE 1 TABLET BY MOUTH ONCE DAILY 90 tablet 0 08/29/2017 at Unknown time  . loratadine (CLARITIN) 10 MG tablet Take 1 tablet (10 mg total) by mouth daily. 30 tablet 0 08/29/2017 at Unknown time  . LORazepam (ATIVAN) 0.5 MG tablet Take 0.5 mg by mouth every 12 (twelve) hours as  needed for anxiety.   08/29/2017 at Unknown time  . magnesium hydroxide (MILK OF MAGNESIA) 400 MG/5ML suspension Take 30 mLs by mouth daily as needed for mild constipation.    at unknown  . meclizine (ANTIVERT) 12.5 MG tablet Take 1 tablet (12.5 mg total) by mouth 2 (two) times daily as needed for dizziness. 10 tablet 0 Past Month at Unknown time  . metolazone (ZAROXOLYN) 2.5 MG tablet Take 1 tablet (2.5 mg total) by mouth as needed (for weight 134 lb or greater). (Patient taking differently: Take 2.5 mg by mouth daily as needed (if weight is 134 pounds or greater). ) 10 tablet 3  at unknown  . nitroGLYCERIN (NITROSTAT) 0.4 MG SL tablet Place 1 tablet (0.4 mg total) under the tongue every 5 (five) minutes as needed. For chest pain (Patient taking differently: Place 0.4 mg under the tongue every 5 (five) minutes as needed for chest pain. ) 25 tablet 6 08/19/2017 at Unknown time  . pantoprazole (PROTONIX) 40 MG tablet Take 1 tablet (40 mg total) by mouth daily. 30 tablet 0 08/29/2017 at Unknown time  . polyethylene glycol (MIRALAX) packet Take 17 g by mouth daily. Mix with 8 ounces of liquid. (Patient taking differently: Take 17 g by mouth 3 (three) times a week. Monday, Wednesday, Friday.  Mix with 8 ounces of liquid.) 14 each 0 08/28/2017 at Unknown time  . potassium chloride SA (K-DUR,KLOR-CON) 20 MEQ tablet Take 1 tablet (20 mEq total) by mouth daily. Take an additional 2meq with Metolazone. 90 tablet 2  08/29/2017 at Unknown time  . simvastatin (ZOCOR) 10 MG tablet TAKE ONE TABLET BY MOUTH AT BEDTIME 30 tablet 3 08/28/2017 at Unknown time  . torsemide (DEMADEX) 20 MG tablet Take 1 tablet (20 mg total) by mouth daily. Please start on 11/22/2016 60 tablet 6 08/29/2017 at Unknown time  . doxycycline (VIBRAMYCIN) 100 MG capsule Take 1 capsule (100 mg total) by mouth 2 (two) times daily. One po bid x 7 days (Patient not taking: Reported on 08/29/2017) 14 capsule 0 Not Taking at Unknown time  . lidocaine (LIDODERM) 5 % Place 2 patches onto the skin daily. Remove & Discard patch within 12 hours or as directed by MD (Patient not taking: Reported on 08/29/2017) 30 patch 0 Not Taking at Unknown time    I have reviewed patient's Past Medical Hx, Surgical Hx, Family Hx, Social Hx, medications and allergies.  ROS:  Review of Systems  Constitutional: Negative for fever.  Gastrointestinal: Negative for abdominal pain, constipation, diarrhea, nausea and vomiting.  Genitourinary: Positive for vaginal bleeding. Negative for dysuria and pelvic pain.  Musculoskeletal: Negative for back pain.  Neurological: Negative for headaches.   Other systems negative     Physical Exam  Patient Vitals for the past 24 hrs:  BP Temp Pulse Resp SpO2  08/29/17 1620 (!) 126/56 97.7 F (36.5 C) 65 18 96 %   Constitutional: Well-developed, well-nourished female in no acute distress.  Cardiovascular: normal rate and rhythm, no ectopy audible, S1 & S2 heard, no murmur Respiratory: normal effort, no distress. Lungs CTAB with no wheezes or crackles GI: Abd soft, non-tender.  Nondistended.  No rebound, No guarding.  Bowel Sounds audible  There is a 4cm mass palpated RLQ, nontender MS: Extremities nontender, no edema, normal ROM Neurologic: Alert and oriented x 4.   Grossly nonfocal. GU: Neg CVAT. Skin:  Warm and Dry Psych:  Affect appropriate.  PELVIC EXAM: Cervix pink, visually closed, without lesion, scant white creamy  discharge, vaginal walls and external genitalia normal Bimanual exam: Cervix firm, anterior, neg CMT, uterus nontender, nonenlarged, adnexa without tenderness, enlargement, or mass  Rectal exam:   Brown stool on glove. There are no palpable masses.                            There are several small external hemorrhoids.  No blood seen on any of them. One does have a small 31mm hole in it.  ? Source of bleeding   Was unable to express blood from it.     Labs: Results for orders placed or performed during the hospital encounter of 08/29/17 (from the past 24 hour(s))  CBC     Status: Abnormal   Collection Time: 08/29/17  4:29 PM  Result Value Ref Range   WBC 4.7 4.0 - 10.5 K/uL   RBC 3.97 3.87 - 5.11 MIL/uL   Hemoglobin 12.6 12.0 - 15.0 g/dL   HCT 39.3 36.0 - 46.0 %   MCV 99.0 78.0 - 100.0 fL   MCH 31.7 26.0 - 34.0 pg   MCHC 32.1 30.0 - 36.0 g/dL   RDW 15.2 11.5 - 15.5 %   Platelets 120 (L) 150 - 400 K/uL      Imaging:  Ct Abdomen Pelvis Wo Contrast  Result Date: 08/13/2017 CLINICAL DATA:  Ct a/p wo, complaining of RUQ/epigastric pain, ( no oral or iv per MD) EXAM: CT ABDOMEN AND PELVIS WITHOUT CONTRAST TECHNIQUE: Multidetector CT imaging of the abdomen and pelvis was performed following the standard protocol without IV contrast. COMPARISON:  CT of the abdomen and pelvis 11/11/2016 and contrast-enhanced CT on 07/20/2012 FINDINGS: Lower chest: Patchy opacities are identified the lung bases bilaterally, some of which appear chronic. More focal airspace filling in the left lower lobe could represent early infectious infiltrate. The heart is enlarged. Pacing leads are in place to the right atrium and right ventricle. There is extensive coronary artery disease. No pericardial effusion. Hepatobiliary: The liver is diffusely high attenuation without focal mass. Status post cholecystectomy. Pancreas: Unremarkable. No pancreatic ductal dilatation or surrounding inflammatory changes. Spleen: Normal in  size without focal abnormality. Adrenals/Urinary Tract: Adrenal glands are normal in appearance. Right kidney and ureter are unremarkable. There is stable appearance to left kidney over multiple prior exams. 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 There is mild dilatation of the upper pole moiety, also stable. No evidence for obstructing calculus. Stomach/Bowel: The stomach and small bowel loops are normal in appearance. There are a large number of colonic diverticula. No acute diverticulitis. There is a large amount of stool throughout loops of colon. The appendix is well seen and has a normal appearance. Vascular/Lymphatic: There is dense atherosclerotic calcification of the abdominal aorta not associated with aneurysm. No retroperitoneal or mesenteric adenopathy. Reproductive: The uterus is present. No adnexal mass. No free pelvic fluid. Other: Anterior abdominal wall is unremarkable. Musculoskeletal: Moderate mid lumbar spondylosis and scoliosis. No suspicious lytic or blastic lesions. IMPRESSION: 1. No acute abnormality of the abdomen or pelvis. 2. Airspace filling opacities in the left lower lobe could represent acute infection superimposed on chronic fibrosis. 3. Colonic diverticulosis without acute diverticulitis. 4. Moderate stool burden. 5. Cardiomegaly.  Extensive coronary artery disease. 6.  Aortic atherosclerosis.  (ICD10-I70.0) 7. High attenuation liver compatible with amiodarone therapy. 8. Status post cholecystectomy. 9. Stable appearance of left kidney. Electronically Signed   By: Nolon Nations M.D.  On: 08/13/2017 15:01   Dg Chest 2 View  Result Date: 08/19/2017 CLINICAL DATA:  Chest pain. EXAM: CHEST  2 VIEW COMPARISON:  Chest x-ray dated July 24, 2017. FINDINGS: Right chest wall AICD in unchanged position. The cardiomediastinal silhouette is at the upper limits of normal in size. Normal pulmonary vascularity.  Atherosclerotic calcification of the aortic arch. Peripheral density in the left mid lung is without correlate on the lateral view and likely represents overlapping soft tissue. The right lung is clear. No consolidation, pneumothorax or pleural effusion. No acute osseous abnormality. IMPRESSION: No active cardiopulmonary disease. Electronically Signed   By: Titus Dubin M.D.   On: 08/19/2017 12:38   US Pelvis (transabdominal Only)  Result Date: 08/29/2017 CLINICAL DATA:  Vaginal bleeding EXAM: TRANSABDOMINAL ULTRASOUND OF PELVIS TECHNIQUE: Transabdominal ultrasound examination of the pelvis was performed including evaluation of the uterus, ovaries, adnexal regions, and pelvic cul-de-sac. COMPARISON:  08/13/2017 FINDINGS: Uterus Poorly visualized due to bowel Endometrium Poorly visualized due to bowel gas. Right ovary Not visualized Left ovary Not visualized Other findings:  No abnormal free fluid. IMPRESSION: Very limited study due to bowel gas and shadowing. Uterus, endometrium and ovaries are not discretely visible. Electronically Signed   By: Donavan Foil M.D.   On: 08/29/2017 18:13    MAU Course/MDM: I have ordered labs as follows:  See above.  Good hemoglobin.  Slightly low platelets Imaging ordered: [place] which was negative. Previous CT showed diverticulosis, ?source Results reviewed.   Consult Dr Nehemiah Settle with presentation, exam findings, and results..   Treatments in MAU included none.   Pt stable at time of discharge.  Assessment: 1. Bleeding   2.    Unclear source of bleeding, negative vaginal and rectal exams 3.    Thrombocytopenia 4.     On Eliquis  Plan: Discharge home Recommend Follow up with her doctor.  Return if bleeding returns  Encouraged to return here or to other Urgent Care/ED if she develops worsening of symptoms, increase in pain, fever, or other concerning symptoms.   Hansel Feinstein CNM, MSN Certified Nurse-Midwife 08/29/2017 6:20 PM

## 2017-08-29 NOTE — Telephone Encounter (Signed)
Spoke with pt's daughter, Joseph Art. She states that foot is doing well and has no further s/s of infection. Nothing further needed at this time.

## 2017-08-29 NOTE — MAU Provider Note (Signed)
Chief Complaint:  Vaginal Bleeding   First Provider Initiated Contact with Patient 08/29/17 1643     HPI: [name] is a 83 y.o. G3P3000 who presents to maternity admissions via EMS who reportedly spontaneously started bleeding.  Got up and felt wetness, thought it was urine, but it was blood   Filled two pads.  No pain anywhere. No history of GYN problems, except menorrhagia when she was still menstruating. NO postmenopausal bleeding.  No recent diarrhea or contstipation , states "they have me regulated".  Is on Eliquis for Afib.  Is a DNR. Has CHF also.   . She reports vaginal bleeding, but no vaginal itching/burning, urinary symptoms, h/a, dizziness, n/v, or fever/chills.    Patient is an excellent historian.  Vaginal Bleeding  The patient's primary symptoms include vaginal bleeding (possibly rectal). The patient's pertinent negatives include no genital itching, genital lesions, genital odor or pelvic pain. This is a new problem. The current episode started today. The problem occurs intermittently. The problem has been resolved. The patient is experiencing no pain. She is not pregnant. Pertinent negatives include no abdominal pain, back pain, constipation, diarrhea, dysuria, fever, headaches, nausea or vomiting. The vaginal discharge was bloody. There has been no bleeding. She has not been passing clots. She has not been passing tissue. She is not sexually active. She is postmenopausal.    RN note: Pt came EMS from [facility] where she is a resident. About an hour ago got up and felt something run down her legs. Went to [facility] and noticed she was having vag bleeding. NO pain.  Past Medical History: Past Medical History:  Diagnosis Date  . Anemia   . Atrial fibrillation ([place])    a. Dx 01/2014->Eliquis started.  . Cancer ([place])   . Chest pain    a. 2011 Neg MV;  b. 01/2014 Cath: LM 20-30, LAD nl, D1 nl, LCX nl, OM1 nl, RCA dom 30-51m, PD/PL nl, EF 65%->Med Rx; c. 03/2016 MV: no  ischemia/infarct. EF 63%.  . Degenerative joint disease   . Diastolic CHF, acute on chronic ([place]) 01/31/2012  . Dyslipidemia   . Dysrhythmia   . Family history of adverse reaction to anesthesia    " multiple family members have difficulty waking "  . GERD (gastroesophageal reflux disease)   . Hemorrhoid   . History of skin cancer    tafeen/ non melanoma.   Marland Kitchen Hx of varicella   . Hypertension   . Hypothyroidism   . Monoclonal gammopathies   . Pacemaker   . Palpitations    PVCs/bigeminy on event in May 2009 revealing this was relatively asymptomatic  . Right lower quadrant abdominal pain 07/19/2012   Recurrent  With nausea    This is new since she had ct for llq pain in [facility]    R/o appendiceal problem  Hernia  Get ct scan  And plan  Fu     . Thrombocytopenia ([place])     Past obstetric history: OB History  Gravida Para Term Preterm AB Living  3 3 3         SAB TAB Ectopic Multiple Live Births               # Outcome Date GA Lbr Len/2nd Weight Sex Delivery Anes PTL Lv  3 Term 11/09/61    F Vag-Spont     2 Term 01/24/59     Vag-Spont     1 Term 08/03/56    F CS-LTranv  Past Surgical History: Past Surgical History:  Procedure Laterality Date  . CARDIOVERSION N/A 02/26/2014   Procedure: CARDIOVERSION AT BEDSIDE;  Surgeon: Pixie Casino, MD;  Location: [facility];  Service: Cardiovascular;  Laterality: N/A;  . CARDIOVERSION N/A 04/22/2014   Procedure: CARDIOVERSION (BEDSIDE);  Surgeon: [name], MD;  Location: [facility] OR;  Service: Cardiovascular;  Laterality: N/A;  . CATARACT EXTRACTION     Bilateral  implantt  . CESAREAN SECTION     times 2  . CHOLECYSTECTOMY  1999  . ESOPHAGOGASTRODUODENOSCOPY N/A 07/06/2014   Procedure: ESOPHAGOGASTRODUODENOSCOPY (EGD);  Surgeon: Ladene Artist, MD;  Location: [facility] ENDOSCOPY;  Service: Endoscopy;  Laterality: N/A;  . INSERT / REPLACE / REMOVE PACEMAKER    . LEFT HEART CATHETERIZATION WITH CORONARY ANGIOGRAM N/A 01/29/2014   Procedure: LEFT  HEART CATHETERIZATION WITH CORONARY ANGIOGRAM;  Surgeon: Blane Ohara, MD;  Location: [facility] CATH LAB;  Service: Cardiovascular;  Laterality: N/A;  . PACEMAKER INSERTION  04/23/2014   STJ Assurity dual chamber pacemaker implanted by Dr Rayann Heman  . PERMANENT PACEMAKER INSERTION N/A 04/23/2014   Procedure: PERMANENT PACEMAKER INSERTION;  Surgeon: Evans Lance, MD;  Location: [facility] CATH LAB;  Service: Cardiovascular;  Laterality: N/A;  . RIGHT HEART CATHETERIZATION N/A 09/22/2014   Procedure: RIGHT HEART CATH;  Surgeon: Jolaine Artist, MD;  Location: [facility] CATH LAB;  Service: Cardiovascular;  Laterality: N/A;  . SHOULDER SURGERY  1996   Right     Family History: Family History  Problem Relation Age of Onset  . Coronary artery disease Father   . Heart disease Father   . Heart attack Father   . Alzheimer's disease Mother   . Lung cancer Brother 6  . Lung cancer Unknown     Social History: Social History  Substance Use Topics  . Smoking status: Never Smoker  . Smokeless tobacco: Never Used  . Alcohol use No    Allergies:  Allergies  Allergen Reactions  . Other Shortness Of Breath    Detergents, perfumes and other strong, odor-emitting compounds  . Tramadol Shortness Of Breath and Nausea Only  . Ibuprofen Other (See Comments)    Listed as an allergy on MAR    Meds:  Prescriptions Prior to Admission  Medication Sig Dispense Refill Last Dose  . acetaminophen (TYLENOL) 325 MG tablet Take 650 mg by mouth every 4 (four) hours as needed (for pain).    Past Week at Unknown time  . amiodarone (PACERONE) 200 MG tablet Take 1 tablet (200 mg total) by mouth daily. 90 tablet 3 08/29/2017 at Unknown time  . citalopram (CELEXA) 10 MG tablet Take 10 mg by mouth daily.   08/29/2017 at Unknown time  . docusate sodium (COLACE) 50 MG capsule Take 50 mg by mouth daily.   08/29/2017 at Unknown time  . ELIQUIS 2.5 MG TABS tablet TAKE ONE TABLET BY MOUTH TWICE DAILY (Patient taking differently: Take 2.5  mg by mouth two times a day) 60 tablet 6 08/29/2017 at 0800  . Ferrous Sulfate (IRON) 325 (65 Fe) MG TABS Take 1 tablet by mouth daily. 180 each 0 08/29/2017 at Unknown time  . levothyroxine (SYNTHROID, LEVOTHROID) 75 MCG tablet TAKE 1 TABLET BY MOUTH ONCE DAILY 90 tablet 0 08/29/2017 at Unknown time  . loratadine (CLARITIN) 10 MG tablet Take 1 tablet (10 mg total) by mouth daily. 30 tablet 0 08/29/2017 at Unknown time  . LORazepam (ATIVAN) 0.5 MG tablet Take 0.5 mg by mouth every 12 (twelve) hours as  needed for anxiety.   08/29/2017 at Unknown time  . magnesium hydroxide (MILK OF MAGNESIA) 400 MG/5ML suspension Take 30 mLs by mouth daily as needed for mild constipation.    at unknown  . meclizine (ANTIVERT) 12.5 MG tablet Take 1 tablet (12.5 mg total) by mouth 2 (two) times daily as needed for dizziness. 10 tablet 0 Past Month at Unknown time  . metolazone (ZAROXOLYN) 2.5 MG tablet Take 1 tablet (2.5 mg total) by mouth as needed (for weight 134 lb or greater). (Patient taking differently: Take 2.5 mg by mouth daily as needed (if weight is 134 pounds or greater). ) 10 tablet 3  at unknown  . nitroGLYCERIN (NITROSTAT) 0.4 MG SL tablet Place 1 tablet (0.4 mg total) under the tongue every 5 (five) minutes as needed. For chest pain (Patient taking differently: Place 0.4 mg under the tongue every 5 (five) minutes as needed for chest pain. ) 25 tablet 6 08/19/2017 at Unknown time  . pantoprazole (PROTONIX) 40 MG tablet Take 1 tablet (40 mg total) by mouth daily. 30 tablet 0 08/29/2017 at Unknown time  . polyethylene glycol (MIRALAX) packet Take 17 g by mouth daily. Mix with 8 ounces of liquid. (Patient taking differently: Take 17 g by mouth 3 (three) times a week. Monday, Wednesday, Friday.  Mix with 8 ounces of liquid.) 14 each 0 08/28/2017 at Unknown time  . potassium chloride SA (K-DUR,KLOR-CON) 20 MEQ tablet Take 1 tablet (20 mEq total) by mouth daily. Take an additional 1meq with Metolazone. 90 tablet 2  08/29/2017 at Unknown time  . simvastatin (ZOCOR) 10 MG tablet TAKE ONE TABLET BY MOUTH AT BEDTIME 30 tablet 3 08/28/2017 at Unknown time  . torsemide (DEMADEX) 20 MG tablet Take 1 tablet (20 mg total) by mouth daily. Please start on 11/22/2016 60 tablet 6 08/29/2017 at Unknown time  . doxycycline (VIBRAMYCIN) 100 MG capsule Take 1 capsule (100 mg total) by mouth 2 (two) times daily. One po bid x 7 days (Patient not taking: Reported on 08/29/2017) 14 capsule 0 Not Taking at Unknown time  . lidocaine (LIDODERM) 5 % Place 2 patches onto the skin daily. Remove & Discard patch within 12 hours or as directed by MD (Patient not taking: Reported on 08/29/2017) 30 patch 0 Not Taking at Unknown time    I have reviewed patient's Past Medical Hx, Surgical Hx, Family Hx, Social Hx, medications and allergies.  ROS:  Review of Systems  Constitutional: Negative for fever.  Gastrointestinal: Negative for abdominal pain, constipation, diarrhea, nausea and vomiting.  Genitourinary: Positive for vaginal bleeding. Negative for dysuria and pelvic pain.  Musculoskeletal: Negative for back pain.  Neurological: Negative for headaches.   Other systems negative     Physical Exam   Patient Vitals for the past 24 hrs:  BP Temp Pulse Resp SpO2  08/29/17 1848 (!) 108/55 - 71 18 96 %  08/29/17 1620 (!) 126/56 97.7 F (36.5 C) 65 18 96 %   Constitutional: Well-developed, well-nourished female in no acute distress.  Cardiovascular: normal rate and rhythm, no ectopy audible, S1 & S2 heard, no murmur Respiratory: normal effort, no distress. Lungs CTAB with no wheezes or crackles GI: Abd soft, non-tender.  Nondistended.  No rebound, No guarding.  Bowel Sounds audible  There is a 4cm mass palpated RLQ, nontender MS: Extremities nontender, no edema, normal ROM Neurologic: Alert and oriented x 4.   Grossly nonfocal. GU: Neg CVAT. Skin:  Warm and Dry Psych:  Affect appropriate.  PELVIC  EXAM: Cervix pink, visually  closed, without lesion, scant white creamy discharge, vaginal walls and external genitalia normal Bimanual exam: Cervix firm, anterior, neg CMT, uterus nontender, nonenlarged, adnexa without tenderness, enlargement, or mass  Rectal exam:   Brown stool on glove. There are no palpable masses.                            There are several small external hemorrhoids.  No blood seen on any of them. One does have a small 52mm hole in it.  ? Source of bleeding   Was unable to express blood from it.     Labs: Results for orders placed or performed during the hospital encounter of 08/29/17 (from the past 24 hour(s))  CBC     Status: Abnormal   Collection Time: 08/29/17  4:29 PM  Result Value Ref Range   WBC 4.7 4.0 - 10.5 K/uL   RBC 3.97 3.87 - 5.11 MIL/uL   Hemoglobin 12.6 12.0 - 15.0 g/dL   HCT 39.3 36.0 - 46.0 %   MCV 99.0 78.0 - 100.0 fL   MCH 31.7 26.0 - 34.0 pg   MCHC 32.1 30.0 - 36.0 g/dL   RDW 15.2 11.5 - 15.5 %   Platelets 120 (L) 150 - 400 K/uL      Imaging:  Ct Abdomen Pelvis Wo Contrast  Result Date: 08/13/2017 CLINICAL DATA:  Ct a/p wo, complaining of RUQ/epigastric pain, ( no oral or iv per MD) EXAM: CT ABDOMEN AND PELVIS WITHOUT CONTRAST TECHNIQUE: Multidetector CT imaging of the abdomen and pelvis was performed following the standard protocol without IV contrast. COMPARISON:  CT of the abdomen and pelvis 11/11/2016 and contrast-enhanced CT on 07/20/2012 FINDINGS: Lower chest: Patchy opacities are identified the lung bases bilaterally, some of which appear chronic. More focal airspace filling in the left lower lobe could represent early infectious infiltrate. The heart is enlarged. Pacing leads are in place to the right atrium and right ventricle. There is extensive coronary artery disease. No pericardial effusion. Hepatobiliary: The liver is diffusely high attenuation without focal mass. Status post cholecystectomy. Pancreas: Unremarkable. No pancreatic ductal dilatation or  surrounding inflammatory changes. Spleen: Normal in size without focal abnormality. Adrenals/Urinary Tract: Adrenal glands are normal in appearance. Right kidney and ureter are unremarkable. There is stable appearance to left kidney over multiple prior exams. 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 There is mild dilatation of the upper pole moiety, also stable. No evidence for obstructing calculus. Stomach/Bowel: The stomach and small bowel loops are normal in appearance. There are a large number of colonic diverticula. No acute diverticulitis. There is a large amount of stool throughout loops of colon. The appendix is well seen and has a normal appearance. Vascular/Lymphatic: There is dense atherosclerotic calcification of the abdominal aorta not associated with aneurysm. No retroperitoneal or mesenteric adenopathy. Reproductive: The uterus is present. No adnexal mass. No free pelvic fluid. Other: Anterior abdominal wall is unremarkable. Musculoskeletal: Moderate mid lumbar spondylosis and scoliosis. No suspicious lytic or blastic lesions. IMPRESSION: 1. No acute abnormality of the abdomen or pelvis. 2. Airspace filling opacities in the left lower lobe could represent acute infection superimposed on chronic fibrosis. 3. Colonic diverticulosis without acute diverticulitis. 4. Moderate stool burden. 5. Cardiomegaly.  Extensive coronary artery disease. 6.  Aortic atherosclerosis.  (ICD10-I70.0) 7. High attenuation liver compatible with amiodarone therapy. 8. Status post cholecystectomy. 9. Stable appearance of  left kidney. Electronically Signed   By: Nolon Nations M.D.   On: 08/13/2017 15:01   Dg Chest 2 View  Result Date: 08/19/2017 CLINICAL DATA:  Chest pain. EXAM: CHEST  2 VIEW COMPARISON:  Chest x-ray dated July 24, 2017. FINDINGS: Right chest wall AICD in unchanged position. The cardiomediastinal silhouette is at the upper limits of  normal in size. Normal pulmonary vascularity. Atherosclerotic calcification of the aortic arch. Peripheral density in the left mid lung is without correlate on the lateral view and likely represents overlapping soft tissue. The right lung is clear. No consolidation, pneumothorax or pleural effusion. No acute osseous abnormality. IMPRESSION: No active cardiopulmonary disease. Electronically Signed   By: Titus Dubin M.D.   On: 08/19/2017 12:38   US Pelvis (transabdominal Only)  Result Date: 08/29/2017 CLINICAL DATA:  Vaginal bleeding EXAM: TRANSABDOMINAL ULTRASOUND OF PELVIS TECHNIQUE: Transabdominal ultrasound examination of the pelvis was performed including evaluation of the uterus, ovaries, adnexal regions, and pelvic cul-de-sac. COMPARISON:  08/13/2017 FINDINGS: Uterus Poorly visualized due to bowel Endometrium Poorly visualized due to bowel gas. Right ovary Not visualized Left ovary Not visualized Other findings:  No abnormal free fluid. IMPRESSION: Very limited study due to bowel gas and shadowing. Uterus, endometrium and ovaries are not discretely visible. Electronically Signed   By: Donavan Foil M.D.   On: 08/29/2017 18:13    MAU Course/MDM: I have ordered labs as follows:  See above.  Good hemoglobin.  Slightly low platelets Imaging ordered: [place] which was negative. Previous CT showed diverticulosis, ?source Results reviewed.   Consult Dr Nehemiah Settle with presentation, exam findings, and results..   Treatments in MAU included none.   Pt stable at time of discharge.  Assessment: 1. Bleeding   2.    Unclear source of bleeding, negative vaginal and rectal exams 3.    Thrombocytopenia 4.     On Eliquis  Plan: Discharge home Recommend Follow up with her doctor.  Return if bleeding returns Follow-up Information    Your doctor. Go to.           Encouraged to return here or to other Urgent Care/ED if she develops worsening of symptoms, increase in pain, fever, or other concerning  symptoms.   Hansel Feinstein CNM, MSN Certified Nurse-Midwife 08/29/2017 7:04 PM

## 2017-08-29 NOTE — MAU Note (Signed)
Pt came EMS from [facility] where she is a resident. About an hour ago got up and felt something run down her legs. Went to [facility] and noticed she was having vag bleeding. NO pain.

## 2017-08-31 ENCOUNTER — Ambulatory Visit (HOSPITAL_COMMUNITY)
Admission: RE | Admit: 2017-08-31 | Discharge: 2017-08-31 | Disposition: A | Discharge status: Home / Self Care | Payer: Medicare Other | Source: Ambulatory Visit | Attending: Cardiology | Admitting: Cardiology

## 2017-08-31 VITALS — BP 122/60 | HR 69 | Wt 124.0 lb

## 2017-08-31 DIAGNOSIS — Z9889 Other specified postprocedural states: Secondary | ICD-10-CM | POA: Insufficient documentation

## 2017-08-31 DIAGNOSIS — E039 Hypothyroidism, unspecified: Secondary | ICD-10-CM | POA: Diagnosis not present

## 2017-08-31 DIAGNOSIS — I481 Persistent atrial fibrillation: Secondary | ICD-10-CM | POA: Insufficient documentation

## 2017-08-31 DIAGNOSIS — K219 Gastro-esophageal reflux disease without esophagitis: Secondary | ICD-10-CM | POA: Diagnosis not present

## 2017-08-31 DIAGNOSIS — I480 Paroxysmal atrial fibrillation: Secondary | ICD-10-CM | POA: Diagnosis not present

## 2017-08-31 DIAGNOSIS — I5033 Acute on chronic diastolic (congestive) heart failure: Secondary | ICD-10-CM | POA: Insufficient documentation

## 2017-08-31 DIAGNOSIS — Z7902 Long term (current) use of antithrombotics/antiplatelets: Secondary | ICD-10-CM | POA: Insufficient documentation

## 2017-08-31 DIAGNOSIS — R0789 Other chest pain: Secondary | ICD-10-CM | POA: Insufficient documentation

## 2017-08-31 DIAGNOSIS — N184 Chronic kidney disease, stage 4 (severe): Secondary | ICD-10-CM

## 2017-08-31 DIAGNOSIS — E785 Hyperlipidemia, unspecified: Secondary | ICD-10-CM | POA: Insufficient documentation

## 2017-08-31 DIAGNOSIS — Z950 Presence of cardiac pacemaker: Secondary | ICD-10-CM | POA: Diagnosis not present

## 2017-08-31 DIAGNOSIS — I2510 Atherosclerotic heart disease of native coronary artery without angina pectoris: Secondary | ICD-10-CM

## 2017-08-31 DIAGNOSIS — R001 Bradycardia, unspecified: Secondary | ICD-10-CM | POA: Insufficient documentation

## 2017-08-31 DIAGNOSIS — Z79899 Other long term (current) drug therapy: Secondary | ICD-10-CM | POA: Insufficient documentation

## 2017-08-31 DIAGNOSIS — R531 Weakness: Secondary | ICD-10-CM | POA: Diagnosis not present

## 2017-08-31 DIAGNOSIS — Z85820 Personal history of malignant melanoma of skin: Secondary | ICD-10-CM | POA: Diagnosis not present

## 2017-08-31 DIAGNOSIS — I5032 Chronic diastolic (congestive) heart failure: Secondary | ICD-10-CM | POA: Diagnosis not present

## 2017-08-31 DIAGNOSIS — R079 Chest pain, unspecified: Secondary | ICD-10-CM

## 2017-08-31 DIAGNOSIS — I130 Hypertensive heart and chronic kidney disease with heart failure and stage 1 through stage 4 chronic kidney disease, or unspecified chronic kidney disease: Secondary | ICD-10-CM | POA: Insufficient documentation

## 2017-08-31 NOTE — Progress Notes (Signed)
Patient ID: [name], female   DOB: June 17, 1925, 83 y.o.   MRN: [id number]   Advanced Heart Failure Clinic Note   PCP: Dr Netty Starring Primary Cardiologist: Dr. Harrington Challenger Primary HF: Dr. Haroldine Laws   HPI: [name] is a 83 y.o. female with the history of nonobstructive CAD by cardiac catheterization in 01/2014, persistent atrial fibrillation, diastolic CHF, HTN, HL, anemia, and CKD. Patient was admitted 04/2014 x2 with acute on chronic diastolic CHF. She underwent cardioversion with restoration of NSR. This resulted in profound bradycardia and hypotension requiring pacemaker implantation. Pacemaker implant was complicated by acute blood loss anemia requiring transfusion with PRBCs  Admitted to [facility] August 21 with increased dyspnea. Diuresed with IV lasix and transitioned to po lasix 40 mg twice a day. GI evaluated due to anemia and black stool. Had EGD with no acute findings. Colonoscopy was cancelled per daughter due to multiple medical problems. She continued on eliquis 2.5 mg twice a day.  Palliative Care consulted and the patient and family request aggressive care for heart failure. Discharge weight was 152 pounds.   Admitted to [facility] 11/9 after RHC due to elevated filling pressures. Once diuresed she was put on torsemide 20 mg twice a day. She was also restarted on amiodarone 200 mg daily and continue on eliquis 2.5 mg twice a day. Discharge weight was 141 pounds.   Admitted 12/12 to 12/17 with recurrent HF and AF. Admit weight was 136. Was diuresed to 131. Diuresis limited by low BP. Demadex increased from 20 bid to 40 bid.   Admitted 12/06/14 - 12/09/14 for ADHF. Initial weight 134 pounds. Diuresed down to 130 and felt better. Treatment limited due to hypotension and worsening renal failure with creatinine up to 2.6. Renal u/s ok. Metolazone stopped.  Admitted in 3/16 with hypotension. Started on midodrine 2.5 bid. Demadex cut back from 40 bid to 40 daily.   Admitted 5/17 - 5/18 with  recurrent CP. Myoview normal EF > 65% no ischemia.  Admitted 05/09/15 for chest pain. Demedex decreased to 30 mg daily with worsening creatinine. Troponin flat and without trend. D/c weight 133 lb  Admitted 01/21/16 with atypical chest pain. Recent cath, stress test reviewed with her and re-inforced that it is not cardiac related. Ruled out ACS, esophageal dysmotility, normal esophagogram.  Admitted 03/08/17-03/10/17 with atypical chest pain and 7 pound weight gain. She was diuresed and discharge weight was 124 pounds.   Echo 07/26/2017 LVEF 60-65%   Admitted 9/10-9/12/18 for chest pain. Was noted to have started after beginning a course of Keflex. PPI increased. Work up otherwise unremarkable.   Pt presents today for post hospital follow up. Admitted last month as above.  "chest pain" quickly resolved with increase of PPI, and has been quiescent since.  Pt denies SOB with ADLs or walking on flat ground at pace.  She does have mild SOB up stairs, inclines, or in a hurry.  She stays at [facility], and her medications are provided.  Denies orthopnea. Weight stable. No medication list was sent with pt today, we have called for this and it is to be faxed.  Recently seen for "vaginal bleeding" but thought to be possibly from ruptures hemorrhoid.   Studies:  - LHC (3/15): Mid LM 20-30%, mid RCA 30-40%, EF 65%  - Echo (4/15): Mild LVH, EF 60-65%, no RWMA, mild BAE, trivial eff  - Echo 5/15: EF 45-50% septl HK - Carotid US (5/15): 40-59% RICA stenosis. 7-41% LICA stenosis.  - 5/16 Myoview  EF > 65% normal perfusion - Cath 3/15:   Left mainstem: The mid left main has 20-30% stenosis and there is moderate calcification noted. Left anterior descending (LAD): There is mild diffuse plaquing without significant stenosis. The first diagonal is patent. Left circumflex (LCx): Left circumflex is patent. The first OM is patent without significant stenosis. Right coronary artery (RCA): This is a large,  dominant vessel. The vessel is moderately calcified. The mid vessel has 30-40% stenosis.   [place] 09/22/14  RA = 11 RV = 49/6/10 PA = 49/22 (34) PCW = 18 (v = 25) Fick cardiac output/index = 6.4/4.0 PVR = 2.5 WU FA sat = 95% PA sat = 72%,74%  Review of systems complete and found to be negative unless listed in HPI.    Past Medical History:  Diagnosis Date  . Anemia   . Atrial fibrillation ([place])    a. Dx 01/2014->Eliquis started.  . Cancer ([place])   . Chest pain    a. 2011 Neg MV;  b. 01/2014 Cath: LM 20-30, LAD nl, D1 nl, LCX nl, OM1 nl, RCA dom 30-66m, PD/PL nl, EF 65%->Med Rx; c. 03/2016 MV: no ischemia/infarct. EF 63%.  . Degenerative joint disease   . Diastolic CHF, acute on chronic ([place]) 01/31/2012  . Dyslipidemia   . Dysrhythmia   . Family history of adverse reaction to anesthesia    " multiple family members have difficulty waking "  . GERD (gastroesophageal reflux disease)   . Hemorrhoid   . History of skin cancer    tafeen/ non melanoma.   Marland Kitchen Hx of varicella   . Hypertension   . Hypothyroidism   . Monoclonal gammopathies   . Pacemaker   . Palpitations    PVCs/bigeminy on event in May 2009 revealing this was relatively asymptomatic  . Right lower quadrant abdominal pain 07/19/2012   Recurrent  With nausea    This is new since she had ct for llq pain in [facility]    R/o appendiceal problem  Hernia  Get ct scan  And plan  Fu     . Thrombocytopenia ([place])     Current Outpatient Prescriptions  Medication Sig Dispense Refill  . acetaminophen (TYLENOL) 325 MG tablet Take 650 mg by mouth every 4 (four) hours as needed (for pain).     Marland Kitchen amiodarone (PACERONE) 200 MG tablet Take 1 tablet (200 mg total) by mouth daily. 90 tablet 3  . citalopram (CELEXA) 10 MG tablet Take 10 mg by mouth daily.    Marland Kitchen docusate sodium (COLACE) 50 MG capsule Take 50 mg by mouth daily.    Marland Kitchen doxycycline (VIBRAMYCIN) 100 MG capsule Take 1 capsule (100 mg total) by mouth 2 (two) times daily. One po bid x 7 days  (Patient not taking: Reported on 08/29/2017) 14 capsule 0  . ELIQUIS 2.5 MG TABS tablet TAKE ONE TABLET BY MOUTH TWICE DAILY (Patient taking differently: Take 2.5 mg by mouth two times a day) 60 tablet 6  . Ferrous Sulfate (IRON) 325 (65 Fe) MG TABS Take 1 tablet by mouth daily. 180 each 0  . levothyroxine (SYNTHROID, LEVOTHROID) 75 MCG tablet TAKE 1 TABLET BY MOUTH ONCE DAILY 90 tablet 0  . lidocaine (LIDODERM) 5 % Place 2 patches onto the skin daily. Remove & Discard patch within 12 hours or as directed by MD (Patient not taking: Reported on 08/29/2017) 30 patch 0  . loratadine (CLARITIN) 10 MG tablet Take 1 tablet (10 mg total) by mouth daily. 30 tablet 0  .  LORazepam (ATIVAN) 0.5 MG tablet Take 0.5 mg by mouth every 12 (twelve) hours as needed for anxiety.    . magnesium hydroxide (MILK OF MAGNESIA) 400 MG/5ML suspension Take 30 mLs by mouth daily as needed for mild constipation.    . meclizine (ANTIVERT) 12.5 MG tablet Take 1 tablet (12.5 mg total) by mouth 2 (two) times daily as needed for dizziness. 10 tablet 0  . metolazone (ZAROXOLYN) 2.5 MG tablet Take 1 tablet (2.5 mg total) by mouth as needed (for weight 134 lb or greater). (Patient taking differently: Take 2.5 mg by mouth daily as needed (if weight is 134 pounds or greater). ) 10 tablet 3  . nitroGLYCERIN (NITROSTAT) 0.4 MG SL tablet Place 1 tablet (0.4 mg total) under the tongue every 5 (five) minutes as needed. For chest pain (Patient taking differently: Place 0.4 mg under the tongue every 5 (five) minutes as needed for chest pain. ) 25 tablet 6  . pantoprazole (PROTONIX) 40 MG tablet Take 1 tablet (40 mg total) by mouth daily. 30 tablet 0  . polyethylene glycol (MIRALAX) packet Take 17 g by mouth daily. Mix with 8 ounces of liquid. (Patient taking differently: Take 17 g by mouth 3 (three) times a week. Monday, Wednesday, Friday.  Mix with 8 ounces of liquid.) 14 each 0  . potassium chloride SA (K-DUR,KLOR-CON) 20 MEQ tablet Take 1 tablet  (20 mEq total) by mouth daily. Take an additional 23meq with Metolazone. 90 tablet 2  . simvastatin (ZOCOR) 10 MG tablet TAKE ONE TABLET BY MOUTH AT BEDTIME 30 tablet 3  . torsemide (DEMADEX) 20 MG tablet Take 1 tablet (20 mg total) by mouth daily. Please start on 11/22/2016 60 tablet 6   No current facility-administered medications for this encounter.    Vitals:   08/31/17 1431  BP: 122/60  Pulse: 69  SpO2: 98%  Weight: 124 lb (56.2 kg)     Wt Readings from Last 3 Encounters:  08/31/17 124 lb (56.2 kg)  08/19/17 121 lb (54.9 kg)  08/17/17 121 lb 9.6 oz (55.2 kg)    PHYSICAL EXAM: General: Elderly female, Rollator ambulated into clinic. Caregiver present.  HEENT: Normal Neck: Supple. JVP 5-6. Carotids 2+ bilat; no bruits. No thyromegaly or nodule noted. Cor: PMI nondisplaced. Irregular, Soft diastolic murmur. Lungs: CTAB, normal effort. Abdomen: Soft, non-tender, non-distended, no HSM. No bruits or masses. +BS  Extremities: No cyanosis, clubbing, or rash. BLE severe varicose veins. No edema.  Neuro: Alert & orientedx3, cranial nerves grossly intact. moves all 4 extremities w/o difficulty. Affect pleasant   ASSESSMENT & PLAN:  1. Chronic Diastolic Heart Failure - NYHA III - Volume status stable - Continue current torsemide regimen. We have her listed as 20 mg daily, but are awaiting confirmation from SNF.  - Continue to take metolazone (and KCL 20) only as needed. Should have facility contact HF clinic for direction.  - Reinforced fluid restriction to < 2 L daily, sodium restriction to less than 2000 mg daily, and the importance of daily weights.   2.PAF - symptomatic bradycardia s/p PPM.   - Continue Amio 200mg  daily.  - Chronic by recent device interrogation. Rate controlled.  - Continue Eliquis for anticoagulation     3. CKD stage IV - BMET stable earlier this month. .  4. Deconditioning - Now in facility. Continue PT.  5. Chest pain, atypical  - Stress test normal  5/16. Minimal CAD on cath 3/15 - Quiescent since admission last month. That admission thought to  be 2/2 to pill esophagitis on keflex. Resolved with increase of PPI.   Doing well overall. Will write prescription for BOOST so that she can have in facility (was drinking 1-2 at home but facility refused to let her have without a prescription). Will also ask if pt can keep 1-2 tablets of NTG on her person in case of emergencies (She is very anxious leaving them locked up in the medicine cabinet).   Recent labs stable. RTC 6 months. Sooner with symptoms.   Shirley Friar, PA-C 2:32 PM  Greater than 50% of the 25 minute visit was spent in counseling/coordination of care regarding disease state education, salt/fluid restriction, medication reconciliation and sliding scale diuretics.

## 2017-08-31 NOTE — Patient Instructions (Signed)
No changes to medication at this time.  Follow up 6 months with Dr. Haroldine Laws. We will call you closer to this time, or you may call our office to schedule 1 month before you are due to be seen. Take all medication as prescribed the day of your appointment. Bring all medications with you to your appointment.  Do the following things EVERYDAY: 1) Weigh yourself in the morning before breakfast. Write it down and keep it in a log. 2) Take your medicines as prescribed 3) Eat low salt foods-Limit salt (sodium) to 2000 mg per day.  4) Stay as active as you can everyday 5) Limit all fluids for the day to less than 2 liters

## 2017-09-02 ENCOUNTER — Encounter (HOSPITAL_COMMUNITY): Payer: Self-pay

## 2017-09-02 ENCOUNTER — Emergency Department (HOSPITAL_COMMUNITY)
Admission: EM | Admit: 2017-09-02 | Discharge: 2017-09-02 | Disposition: A | Discharge status: Home / Self Care | Payer: Medicare Other | Attending: Emergency Medicine | Admitting: Emergency Medicine

## 2017-09-02 ENCOUNTER — Emergency Department (HOSPITAL_COMMUNITY): Payer: Medicare Other

## 2017-09-02 DIAGNOSIS — R0789 Other chest pain: Secondary | ICD-10-CM

## 2017-09-02 DIAGNOSIS — N184 Chronic kidney disease, stage 4 (severe): Secondary | ICD-10-CM | POA: Diagnosis not present

## 2017-09-02 DIAGNOSIS — I130 Hypertensive heart and chronic kidney disease with heart failure and stage 1 through stage 4 chronic kidney disease, or unspecified chronic kidney disease: Secondary | ICD-10-CM | POA: Insufficient documentation

## 2017-09-02 DIAGNOSIS — I5033 Acute on chronic diastolic (congestive) heart failure: Secondary | ICD-10-CM | POA: Diagnosis not present

## 2017-09-02 DIAGNOSIS — Z79899 Other long term (current) drug therapy: Secondary | ICD-10-CM | POA: Diagnosis not present

## 2017-09-02 DIAGNOSIS — Z7901 Long term (current) use of anticoagulants: Secondary | ICD-10-CM | POA: Diagnosis not present

## 2017-09-02 DIAGNOSIS — D649 Anemia, unspecified: Secondary | ICD-10-CM

## 2017-09-02 DIAGNOSIS — I2510 Atherosclerotic heart disease of native coronary artery without angina pectoris: Secondary | ICD-10-CM | POA: Diagnosis not present

## 2017-09-02 DIAGNOSIS — D696 Thrombocytopenia, unspecified: Secondary | ICD-10-CM | POA: Diagnosis not present

## 2017-09-02 DIAGNOSIS — N289 Disorder of kidney and ureter, unspecified: Secondary | ICD-10-CM | POA: Insufficient documentation

## 2017-09-02 DIAGNOSIS — R079 Chest pain, unspecified: Secondary | ICD-10-CM | POA: Diagnosis present

## 2017-09-02 LAB — CBC WITH DIFFERENTIAL/PLATELET
Basophils Absolute: 0 10*3/uL (ref 0.0–0.1)
Basophils Relative: 0 %
EOS ABS: 0.2 10*3/uL (ref 0.0–0.7)
Eosinophils Relative: 3 %
HEMATOCRIT: 34.3 % — AB (ref 36.0–46.0)
HEMOGLOBIN: 11 g/dL — AB (ref 12.0–15.0)
LYMPHS ABS: 1 10*3/uL (ref 0.7–4.0)
Lymphocytes Relative: 21 %
MCH: 31.6 pg (ref 26.0–34.0)
MCHC: 32.1 g/dL (ref 30.0–36.0)
MCV: 98.6 fL (ref 78.0–100.0)
Monocytes Absolute: 0.4 10*3/uL (ref 0.1–1.0)
Monocytes Relative: 9 %
NEUTROS ABS: 3.2 10*3/uL (ref 1.7–7.7)
NEUTROS PCT: 66 %
Platelets: 110 10*3/uL — ABNORMAL LOW (ref 150–400)
RBC: 3.48 MIL/uL — ABNORMAL LOW (ref 3.87–5.11)
RDW: 15.1 % (ref 11.5–15.5)
WBC: 4.8 10*3/uL (ref 4.0–10.5)

## 2017-09-02 LAB — BASIC METABOLIC PANEL
ANION GAP: 7 (ref 5–15)
BUN: 24 mg/dL — ABNORMAL HIGH (ref 6–20)
CHLORIDE: 106 mmol/L (ref 101–111)
CO2: 24 mmol/L (ref 22–32)
CREATININE: 1.84 mg/dL — AB (ref 0.44–1.00)
Calcium: 9 mg/dL (ref 8.9–10.3)
GFR calc non Af Amer: 23 mL/min — ABNORMAL LOW (ref >60)
GFR, EST AFRICAN AMERICAN: 27 mL/min — AB (ref >60)
Glucose, Bld: 96 mg/dL (ref 65–99)
POTASSIUM: 4.1 mmol/L (ref 3.5–5.1)
Sodium: 137 mmol/L (ref 135–145)

## 2017-09-02 LAB — POC OCCULT BLOOD, ED: Fecal Occult Bld: NEGATIVE

## 2017-09-02 LAB — I-STAT TROPONIN, ED: TROPONIN I, POC: 0 ng/mL (ref 0.00–0.08)

## 2017-09-02 NOTE — ED Triage Notes (Signed)
Pt arrived via [facility] from [facility] c/o central chest pain starting 2 days ago.  Pt took 3 SL Nitro without relief.  EMS gave 1 SL Nitro, and 324mg  ASA.  On 2L O2 [place].

## 2017-09-02 NOTE — ED Notes (Signed)
Spoke with patients daughter Joseph Art and she ask that we call [facility] for transport back to [place].

## 2017-09-02 NOTE — ED Provider Notes (Signed)
[facility] EMERGENCY DEPARTMENT Provider Note   CSN: [id number] Arrival date & time: 09/02/17  0256     History   Chief Complaint Chief Complaint  Patient presents with  . Chest Pain    HPI MOSELLE RISTER is a 83 y.o. female.  The history is provided by the patient.  She complains of intermittent left-sided chest pain over the last 36 hours.  Pain is sharp and sticking and she rates it at 5/10.  Nothing makes it better, nothing makes it worse.  Pain comes and goes with no particular pattern.  She denies dyspnea, nausea, diaphoresis.  EMS gave her aspirin and nitroglycerin without any change.  Of note, she does have history of atrial fibrillation and is anticoagulated on apixaban.   Past Medical History:  Diagnosis Date  . Anemia   . Atrial fibrillation ([place])    a. Dx 01/2014->Eliquis started.  . Cancer ([place])   . Chest pain    a. 2011 Neg MV;  b. 01/2014 Cath: LM 20-30, LAD nl, D1 nl, LCX nl, OM1 nl, RCA dom 30-50m, PD/PL nl, EF 65%->Med Rx; c. 03/2016 MV: no ischemia/infarct. EF 63%.  . Degenerative joint disease   . Diastolic CHF, acute on chronic ([place]) 01/31/2012  . Dyslipidemia   . Dysrhythmia   . Family history of adverse reaction to anesthesia    " multiple family members have difficulty waking "  . GERD (gastroesophageal reflux disease)   . Hemorrhoid   . History of skin cancer    tafeen/ non melanoma.   Marland Kitchen Hx of varicella   . Hypertension   . Hypothyroidism   . Monoclonal gammopathies   . Pacemaker   . Palpitations    PVCs/bigeminy on event in May 2009 revealing this was relatively asymptomatic  . Right lower quadrant abdominal pain 07/19/2012   Recurrent  With nausea    This is new since she had ct for llq pain in [facility]    R/o appendiceal problem  Hernia  Get ct scan  And plan  Fu     . Thrombocytopenia [facility])     Patient Active Problem List   Diagnosis Date Noted  . CAD (coronary artery disease) 07/24/2017  . CKD (chronic kidney  disease), stage IV ([place]) 07/24/2017  . Dementia 07/24/2017  . Atrial fibrillation, chronic ([place]) 07/24/2017  . Monoclonal gammopathy of unknown significance (MGUS) 07/24/2017  . Thrombocytopenia, idiopathic ([place]) 07/24/2017  . Pulmonary HTN ([place]) 07/24/2017  . Mild mitral regurgitation 07/24/2017  . Idiopathic hypotension   . Fall 05/27/2017  . Mitral regurgitation 03/10/2017  . Mitral stenosis 03/10/2017  . Chest pain 03/08/2017  . Dehydration 11/18/2016  . Intractable nausea and vomiting 11/18/2016  . Generalized weakness 11/18/2016  . Hypokalemia 11/11/2016  . Hypertensive heart disease 04/01/2016  . PAF (paroxysmal atrial fibrillation) ([place]) 03/31/2016  . Chest pain with moderate risk for cardiac etiology, negative MI and Nuc study, + muscular skeletal   . Atypical chest pain 01/21/2016  . CKD (chronic kidney disease) stage 4, GFR 15-29 ml/min (HCC) 05/09/2015  . Anemia of chronic disease 05/09/2015  . Dyslipidemia 05/09/2015  . Elevated troponin   . Iron deficiency anemia 11/21/2014  . Pacemaker 04/27/2014  . Chronic anticoagulation 02/25/2014  . Chronic diastolic CHF (congestive heart failure) ([place]) 02/24/2014  . Atrial fibrillation ([place]) 01/29/2014  . MGUS (monoclonal gammopathy of unknown significance) 08/01/2013  . Thrombocytopenia ([place])   . Hypothyroidism 02/19/2012  . Hyperlipidemia 12/15/2008  . HYPERTENSION, BENIGN 12/15/2008  Past Surgical History:  Procedure Laterality Date  . CARDIOVERSION N/A 02/26/2014   Procedure: CARDIOVERSION AT BEDSIDE;  Surgeon: Pixie Casino, MD;  Location: [facility];  Service: Cardiovascular;  Laterality: N/A;  . CARDIOVERSION N/A 04/22/2014   Procedure: CARDIOVERSION (BEDSIDE);  Surgeon: [name], MD;  Location[facility];  Service: Cardiovascular;  Laterality: N/A;  . CATARACT EXTRACTION     Bilateral  implantt  . CESAREAN SECTION     times 2  . CHOLECYSTECTOMY  1999  . ESOPHAGOGASTRODUODENOSCOPY N/A 07/06/2014   Procedure:  ESOPHAGOGASTRODUODENOSCOPY (EGD);  Surgeon: Ladene Artist, MD;  Location[facility] ENDOSCOPY;  Service: Endoscopy;  Laterality: N/A;  . INSERT / REPLACE / REMOVE PACEMAKER    . LEFT HEART CATHETERIZATION WITH CORONARY ANGIOGRAM N/A 01/29/2014   Procedure: LEFT HEART CATHETERIZATION WITH CORONARY ANGIOGRAM;  Surgeon: Blane Ohara, MD;  Location: [facility] CATH LAB;  Service: Cardiovascular;  Laterality: N/A;  . PACEMAKER INSERTION  04/23/2014   STJ Assurity dual chamber pacemaker implanted by Dr Rayann Heman  . PERMANENT PACEMAKER INSERTION N/A 04/23/2014   Procedure: PERMANENT PACEMAKER INSERTION;  Surgeon: Evans Lance, MD;  Location: [facility] CATH LAB;  Service: Cardiovascular;  Laterality: N/A;  . RIGHT HEART CATHETERIZATION N/A 09/22/2014   Procedure: RIGHT HEART CATH;  Surgeon: Jolaine Artist, MD;  Location: [facility] CATH LAB;  Service: Cardiovascular;  Laterality: N/A;  . SHOULDER SURGERY  1996   Right     OB History    Gravida Para Term Preterm AB Living   3 3 3          SAB TAB Ectopic Multiple Live Births                   Home Medications    Prior to Admission medications   Medication Sig Start Date End Date Taking? Authorizing Provider  acetaminophen (TYLENOL) 325 MG tablet Take 650 mg by mouth every 4 (four) hours as needed (for pain).    Yes [provider]  amiodarone (PACERONE) 200 MG tablet Take 1 tablet (200 mg total) by mouth daily. 11/25/16  Yes Bensimhon, Shaune Pascal, MD  bisacodyl (DULCOLAX) 10 MG suppository Place 10 mg rectally as needed for moderate constipation.   Yes [provider]  citalopram (CELEXA) 10 MG tablet Take 10 mg by mouth daily.   Yes [provider]  docusate sodium (COLACE) 50 MG capsule Take 50 mg by mouth daily.   Yes [provider]  ELIQUIS 2.5 MG TABS tablet TAKE ONE TABLET BY MOUTH TWICE DAILY Patient taking differently: Take 2.5 mg by mouth two times a day 05/22/17  Yes Bensimhon, Shaune Pascal, MD  Ferrous Sulfate (IRON) 325 (65 Fe)  MG TABS Take 1 tablet by mouth daily. 07/01/16  Yes Johnson, Clanford L, MD  lactose free nutrition (BOOST) LIQD Take 237 mLs by mouth 2 (two) times daily between meals.   Yes [provider]  levothyroxine (SYNTHROID, LEVOTHROID) 75 MCG tablet TAKE 1 TABLET BY MOUTH ONCE DAILY 05/22/17  Yes Panosh, Standley Brooking, MD  loratadine (CLARITIN) 10 MG tablet Take 1 tablet (10 mg total) by mouth daily. 03/10/17  Yes Jani Gravel, MD  LORazepam (ATIVAN) 0.5 MG tablet Take 0.5 mg by mouth every 12 (twelve) hours as needed for anxiety.   Yes [provider]  magnesium hydroxide (MILK OF MAGNESIA) 400 MG/5ML suspension Take 30 mLs by mouth daily as needed for mild constipation.   Yes [provider]  meclizine (ANTIVERT) 12.5 MG tablet  Take 1 tablet (12.5 mg total) by mouth 2 (two) times daily as needed for dizziness. 09/14/16  Yes Bensimhon, Shaune Pascal, MD  metolazone (ZAROXOLYN) 2.5 MG tablet Take 1 tablet (2.5 mg total) by mouth as needed (for weight 134 lb or greater). Patient taking differently: Take 2.5 mg by mouth daily as needed (if weight is 134 pounds or greater).  03/09/16  Yes Bensimhon, Shaune Pascal, MD  nitroGLYCERIN (NITROSTAT) 0.4 MG SL tablet Place 1 tablet (0.4 mg total) under the tongue every 5 (five) minutes as needed. For chest pain Patient taking differently: Place 0.4 mg under the tongue every 5 (five) minutes as needed for chest pain.  05/06/16  Yes Bensimhon, Shaune Pascal, MD  pantoprazole (PROTONIX) 40 MG tablet Take 1 tablet (40 mg total) by mouth daily. 08/19/17  Yes Duffy Bruce, MD  polyethylene glycol [facility]) packet Take 17 g by mouth daily. Mix with 8 ounces of liquid. Patient taking differently: Take 17 g by mouth every Monday, Wednesday, and Friday. Mix with 8 ounces of liquid. 08/13/17  Yes Malvin Johns, MD  potassium chloride SA (K-DUR,KLOR-CON) 20 MEQ tablet Take 1 tablet (20 mEq total) by mouth daily. Take an additional 59meq with Metolazone. 06/09/17  Yes Pattricia Boss, MD  simvastatin (ZOCOR) 10 MG tablet TAKE ONE TABLET BY MOUTH AT BEDTIME 02/14/17  Yes Larey Dresser, MD  torsemide (DEMADEX) 20 MG tablet Take 1 tablet (20 mg total) by mouth daily. Please start on 11/22/2016 06/09/17  Yes Pattricia Boss, MD  doxycycline (VIBRAMYCIN) 100 MG capsule Take 1 capsule (100 mg total) by mouth 2 (two) times daily. One po bid x 7 days Patient not taking: Reported on 09/02/2017 08/13/17   Malvin Johns, MD  lidocaine (LIDODERM) 5 % Place 2 patches onto the skin daily. Remove & Discard patch within 12 hours or as directed by MD Patient not taking: Reported on 09/02/2017 07/27/17   Geradine Girt, DO    Family History Family History  Problem Relation Age of Onset  . Coronary artery disease Father   . Heart disease Father   . Heart attack Father   . Alzheimer's disease Mother   . Lung cancer Brother 37  . Lung cancer Unknown     Social History Social History  Substance Use Topics  . Smoking status: Never Smoker  . Smokeless tobacco: Never Used  . Alcohol use No     Allergies   Other; Tramadol; and Ibuprofen   Review of Systems Review of Systems  All other systems reviewed and are negative.    Physical Exam Updated Vital Signs BP (!) 113/54   Pulse 67   Temp 98.1 F (36.7 C) (Oral)   Resp 16   Ht 4\' 10"  (1.473 m)   Wt 56.2 kg (124 lb)   SpO2 94%   BMI 25.92 kg/m   Physical Exam  Nursing note and vitals reviewed.  83 year old female, resting comfortably and in no acute distress. Vital signs are normal. Oxygen saturation is 94%, which is normal. Head is normocephalic and atraumatic. PERRLA, EOMI. Oropharynx is clear. Neck is nontender and supple without adenopathy or JVD. Back is nontender and there is no CVA tenderness. Lungs are clear without rales, wheezes, or rhonchi. Chest has point tenderness in the left parasternal area. Heart has regular rate and rhythm without murmur. Abdomen is soft, flat, nontender without masses or  hepatosplenomegaly and peristalsis is normoactive. Extremities have no cyanosis or edema, full range of motion is present.  Skin is warm and dry without rash. Neurologic: Mental status is normal, cranial nerves are intact, there are no motor or sensory deficits.  ED Treatments / Results  Labs (all labs ordered are listed, but only abnormal results are displayed) Labs Reviewed  BASIC METABOLIC PANEL - Abnormal; Notable for the following:       Result Value   BUN 24 (*)    Creatinine, Ser 1.84 (*)    GFR calc non Af Amer 23 (*)    GFR calc Af Amer 27 (*)    All other components within normal limits  CBC WITH DIFFERENTIAL/PLATELET - Abnormal; Notable for the following:    RBC 3.48 (*)    Hemoglobin 11.0 (*)    HCT 34.3 (*)    Platelets 110 (*)    All other components within normal limits  I-STAT TROPONIN, ED  POC OCCULT BLOOD, ED    EKG  EKG Interpretation  Date/Time:  Saturday September 02 2017 02:58:31 EDT Ventricular Rate:  73 PR Interval:    QRS Duration: 99 QT Interval:  449 QTC Calculation: 518 R Axis:   -19 Text Interpretation:  Atrial fibrillation Borderline left axis deviation Low voltage, precordial leads Abnormal T, consider ischemia, lateral leads Prolonged QT interval When compared with ECG of 08/19/2017, Axis has shiftted rightward Confirmed by Delora Fuel ([id number]) on 09/02/2017 3:12:06 AM       Radiology Dg Chest 2 View  Result Date: 09/02/2017 CLINICAL DATA:  83 y/o  F; central chest pain. EXAM: CHEST  2 VIEW COMPARISON:  08/19/2017 chest radiograph. FINDINGS: Stable normal cardiac silhouette. Two lead pacemaker noted. Right upper quadrant cholecystectomy clips. Aortic atherosclerosis with calcification. Clear lungs. No pleural effusion or pneumothorax. No acute osseous abnormality is evident. IMPRESSION: No acute pulmonary process identified. Aortic atherosclerosis. Stable cardiac silhouette. Electronically Signed   By: Kristine Garbe M.D.   On:  09/02/2017 04:36    Procedures Procedures (including critical care time)  Medications Ordered in ED Medications - No data to display   Initial Impression / Assessment and Plan / ED Course  I have reviewed the triage vital signs and the nursing notes.  Pertinent labs & imaging results that were available during my care of the patient were reviewed by me and considered in my medical decision making right coronary artery) see chart for details).  Chest pain which seems somewhat atypical.  ECG shows no acute changes.  Old records are reviewed, and she has several prior ED visits for chest pain and also a recent hospitalization for chest pain.  Nuclear medicine stress test in May 2017 was low risk.  Cardiac catheterization in March 2015 showed no stenosis greater than 30-40% (right coronary artery).  We will check screening labs and chest x-ray.  Laboratory workup shows renal insufficiency and thrombocytopenia which are unchanged from baseline.  Hemoglobin is low and this is a new finding.  Stool Hemoccult was checked and is negative.  Orthostatic vital signs showed no significant change in pulse or blood pressure.  In spite of the drop of hemoglobin, she is felt to be stable for discharge.  She is to follow-up with her PCP in 2-3 days.  Return to the ED if symptoms worsen.  Final Clinical Impressions(s) / ED Diagnoses   Final diagnoses:  Atypical chest pain  Renal insufficiency  Normochromic normocytic anemia  Thrombocytopenia (HCC)    New Prescriptions New Prescriptions   No medications on file     Delora Fuel, MD 29/52/84 [phone]

## 2017-09-02 NOTE — ED Notes (Signed)
Informed Physiological scientist and Patients daughter Joseph Art that she is being transferred back to [place] following CP work-up. Patient alert and transferred back by [facility]

## 2017-09-02 NOTE — ED Notes (Signed)
[facility] here to transport  

## 2017-09-08 ENCOUNTER — Encounter: Payer: Self-pay | Admitting: Cardiology

## 2017-09-12 LAB — CUP PACEART REMOTE DEVICE CHECK
Battery Remaining Longevity: 128 mo
Battery Remaining Percentage: 95.5 %
Battery Voltage: 2.99 V
Brady Statistic RV Percent Paced: 59 %
Implantable Lead Implant Date: 20150610
Implantable Lead Location: 753859
Implantable Lead Model: 5076
Lead Channel Pacing Threshold Amplitude: 1 V
Lead Channel Setting Pacing Amplitude: 2.5 V
Lead Channel Setting Pacing Pulse Width: 0.4 ms
Lead Channel Setting Sensing Sensitivity: 2 mV
MDC IDC LEAD IMPLANT DT: 20150610
MDC IDC LEAD LOCATION: 753860
MDC IDC MSMT LEADCHNL RV IMPEDANCE VALUE: 480 Ohm
MDC IDC MSMT LEADCHNL RV PACING THRESHOLD PULSEWIDTH: 0.4 ms
MDC IDC MSMT LEADCHNL RV SENSING INTR AMPL: 12 mV
MDC IDC PG IMPLANT DT: 20150610
MDC IDC PG SERIAL: 7627922
MDC IDC SESS DTM: 20181010105319
Pulse Gen Model: 2240

## 2017-10-14 ENCOUNTER — Emergency Department (HOSPITAL_COMMUNITY): Payer: Medicare Other

## 2017-10-14 ENCOUNTER — Encounter (HOSPITAL_COMMUNITY): Payer: Self-pay | Admitting: Emergency Medicine

## 2017-10-14 ENCOUNTER — Inpatient Hospital Stay (HOSPITAL_COMMUNITY)
Admission: EM | Admit: 2017-10-14 | Discharge: 2017-10-16 | DRG: 291 | Disposition: A | Discharge status: Skilled Nursing Facility | Payer: Medicare Other | Attending: Internal Medicine | Admitting: Internal Medicine

## 2017-10-14 DIAGNOSIS — K219 Gastro-esophageal reflux disease without esophagitis: Secondary | ICD-10-CM | POA: Diagnosis present

## 2017-10-14 DIAGNOSIS — N184 Chronic kidney disease, stage 4 (severe): Secondary | ICD-10-CM | POA: Diagnosis present

## 2017-10-14 DIAGNOSIS — I509 Heart failure, unspecified: Secondary | ICD-10-CM

## 2017-10-14 DIAGNOSIS — R0789 Other chest pain: Secondary | ICD-10-CM

## 2017-10-14 DIAGNOSIS — E039 Hypothyroidism, unspecified: Secondary | ICD-10-CM | POA: Diagnosis present

## 2017-10-14 DIAGNOSIS — D638 Anemia in other chronic diseases classified elsewhere: Secondary | ICD-10-CM | POA: Diagnosis present

## 2017-10-14 DIAGNOSIS — E785 Hyperlipidemia, unspecified: Secondary | ICD-10-CM | POA: Diagnosis present

## 2017-10-14 DIAGNOSIS — F329 Major depressive disorder, single episode, unspecified: Secondary | ICD-10-CM | POA: Diagnosis present

## 2017-10-14 DIAGNOSIS — D696 Thrombocytopenia, unspecified: Secondary | ICD-10-CM | POA: Diagnosis present

## 2017-10-14 DIAGNOSIS — I480 Paroxysmal atrial fibrillation: Secondary | ICD-10-CM | POA: Diagnosis present

## 2017-10-14 DIAGNOSIS — I2510 Atherosclerotic heart disease of native coronary artery without angina pectoris: Secondary | ICD-10-CM | POA: Diagnosis present

## 2017-10-14 DIAGNOSIS — Z950 Presence of cardiac pacemaker: Secondary | ICD-10-CM

## 2017-10-14 DIAGNOSIS — Z66 Do not resuscitate: Secondary | ICD-10-CM | POA: Diagnosis present

## 2017-10-14 DIAGNOSIS — D472 Monoclonal gammopathy: Secondary | ICD-10-CM | POA: Diagnosis present

## 2017-10-14 DIAGNOSIS — Z885 Allergy status to narcotic agent status: Secondary | ICD-10-CM

## 2017-10-14 DIAGNOSIS — D631 Anemia in chronic kidney disease: Secondary | ICD-10-CM | POA: Diagnosis present

## 2017-10-14 DIAGNOSIS — R079 Chest pain, unspecified: Secondary | ICD-10-CM | POA: Diagnosis present

## 2017-10-14 DIAGNOSIS — I130 Hypertensive heart and chronic kidney disease with heart failure and stage 1 through stage 4 chronic kidney disease, or unspecified chronic kidney disease: Secondary | ICD-10-CM | POA: Diagnosis not present

## 2017-10-14 DIAGNOSIS — I10 Essential (primary) hypertension: Secondary | ICD-10-CM | POA: Diagnosis present

## 2017-10-14 DIAGNOSIS — R4182 Altered mental status, unspecified: Secondary | ICD-10-CM | POA: Diagnosis present

## 2017-10-14 DIAGNOSIS — D509 Iron deficiency anemia, unspecified: Secondary | ICD-10-CM | POA: Diagnosis present

## 2017-10-14 DIAGNOSIS — Z79890 Hormone replacement therapy (postmenopausal): Secondary | ICD-10-CM

## 2017-10-14 DIAGNOSIS — I5033 Acute on chronic diastolic (congestive) heart failure: Secondary | ICD-10-CM | POA: Diagnosis present

## 2017-10-14 DIAGNOSIS — Z7901 Long term (current) use of anticoagulants: Secondary | ICD-10-CM

## 2017-10-14 DIAGNOSIS — Z886 Allergy status to analgesic agent status: Secondary | ICD-10-CM

## 2017-10-14 DIAGNOSIS — I4891 Unspecified atrial fibrillation: Secondary | ICD-10-CM | POA: Diagnosis present

## 2017-10-14 DIAGNOSIS — I5032 Chronic diastolic (congestive) heart failure: Secondary | ICD-10-CM | POA: Diagnosis present

## 2017-10-14 LAB — CBC
HEMATOCRIT: 38.2 % (ref 36.0–46.0)
Hemoglobin: 12.2 g/dL (ref 12.0–15.0)
MCH: 31.7 pg (ref 26.0–34.0)
MCHC: 31.9 g/dL (ref 30.0–36.0)
MCV: 99.2 fL (ref 78.0–100.0)
Platelets: 122 10*3/uL — ABNORMAL LOW (ref 150–400)
RBC: 3.85 MIL/uL — ABNORMAL LOW (ref 3.87–5.11)
RDW: 14.5 % (ref 11.5–15.5)
WBC: 5 10*3/uL (ref 4.0–10.5)

## 2017-10-14 LAB — COMPREHENSIVE METABOLIC PANEL
ALT: 20 U/L (ref 14–54)
ANION GAP: 8 (ref 5–15)
AST: 31 U/L (ref 15–41)
Albumin: 3 g/dL — ABNORMAL LOW (ref 3.5–5.0)
Alkaline Phosphatase: 88 U/L (ref 38–126)
BUN: 33 mg/dL — ABNORMAL HIGH (ref 6–20)
CHLORIDE: 106 mmol/L (ref 101–111)
CO2: 23 mmol/L (ref 22–32)
Calcium: 9.5 mg/dL (ref 8.9–10.3)
Creatinine, Ser: 1.73 mg/dL — ABNORMAL HIGH (ref 0.44–1.00)
GFR, EST AFRICAN AMERICAN: 29 mL/min — AB (ref >60)
GFR, EST NON AFRICAN AMERICAN: 25 mL/min — AB (ref >60)
Glucose, Bld: 106 mg/dL — ABNORMAL HIGH (ref 65–99)
POTASSIUM: 4.5 mmol/L (ref 3.5–5.1)
Sodium: 137 mmol/L (ref 135–145)
TOTAL PROTEIN: 6.1 g/dL — AB (ref 6.5–8.1)
Total Bilirubin: 0.8 mg/dL (ref 0.3–1.2)

## 2017-10-14 LAB — I-STAT TROPONIN, ED
TROPONIN I, POC: 0.07 ng/mL (ref 0.00–0.08)
Troponin i, poc: 0.06 ng/mL (ref 0.00–0.08)

## 2017-10-14 LAB — MRSA PCR SCREENING: MRSA by PCR: NEGATIVE

## 2017-10-14 LAB — BRAIN NATRIURETIC PEPTIDE: B NATRIURETIC PEPTIDE 5: 553.2 pg/mL — AB (ref 0.0–100.0)

## 2017-10-14 LAB — LIPASE, BLOOD: LIPASE: 29 U/L (ref 11–51)

## 2017-10-14 MED ORDER — APIXABAN 2.5 MG PO TABS
2.5000 mg | ORAL_TABLET | Freq: Two times a day (BID) | ORAL | Status: DC
  Administered 2017-10-14 – 2017-10-16 (×4): 2.5 mg via ORAL
  Filled 2017-10-14 (×4): qty 1

## 2017-10-14 MED ORDER — FUROSEMIDE 10 MG/ML IJ SOLN: 40.0000 mg | Freq: Two times a day (BID) | INTRAMUSCULAR | Status: DC

## 2017-10-14 MED ORDER — SODIUM CHLORIDE 0.9 % IV SOLN: 250.0000 mL | INTRAVENOUS | Status: DC | PRN

## 2017-10-14 MED ORDER — SODIUM CHLORIDE 0.9% FLUSH
3.0000 mL | Freq: Two times a day (BID) | INTRAVENOUS | Status: DC
  Administered 2017-10-14 – 2017-10-16 (×3): 3 mL via INTRAVENOUS

## 2017-10-14 MED ORDER — DOCUSATE SODIUM 50 MG PO CAPS: 50.0000 mg | ORAL_CAPSULE | Freq: Every day | ORAL | Status: DC

## 2017-10-14 MED ORDER — AMIODARONE HCL 200 MG PO TABS
200.0000 mg | ORAL_TABLET | Freq: Every day | ORAL | Status: DC
  Administered 2017-10-14 – 2017-10-16 (×3): 200 mg via ORAL
  Filled 2017-10-14 (×3): qty 1

## 2017-10-14 MED ORDER — GUAIFENESIN-DM 100-10 MG/5ML PO SYRP
5.0000 mL | ORAL_SOLUTION | Freq: Four times a day (QID) | ORAL | Status: DC | PRN
  Administered 2017-10-14: 5 mL via ORAL
  Filled 2017-10-14: qty 5

## 2017-10-14 MED ORDER — FUROSEMIDE 10 MG/ML IJ SOLN
40.0000 mg | Freq: Every day | INTRAMUSCULAR | Status: DC
  Administered 2017-10-15 – 2017-10-16 (×2): 40 mg via INTRAVENOUS
  Filled 2017-10-14 (×2): qty 4

## 2017-10-14 MED ORDER — ACETAMINOPHEN 325 MG PO TABS
650.0000 mg | ORAL_TABLET | ORAL | Status: DC | PRN
  Administered 2017-10-14: 650 mg via ORAL
  Filled 2017-10-14: qty 2

## 2017-10-14 MED ORDER — SIMVASTATIN 10 MG PO TABS
10.0000 mg | ORAL_TABLET | Freq: Every day | ORAL | Status: DC
  Administered 2017-10-14 – 2017-10-15 (×2): 10 mg via ORAL
  Filled 2017-10-14 (×2): qty 1

## 2017-10-14 MED ORDER — FERROUS SULFATE 325 (65 FE) MG PO TABS
325.0000 mg | ORAL_TABLET | Freq: Every day | ORAL | Status: DC
  Administered 2017-10-14 – 2017-10-16 (×3): 325 mg via ORAL
  Filled 2017-10-14 (×3): qty 1

## 2017-10-14 MED ORDER — BOOST PO LIQD
237.0000 mL | Freq: Two times a day (BID) | ORAL | Status: DC
  Administered 2017-10-15 – 2017-10-16 (×4): 237 mL via ORAL
  Filled 2017-10-14 (×6): qty 237

## 2017-10-14 MED ORDER — NITROGLYCERIN 0.4 MG SL SUBL: 0.4000 mg | SUBLINGUAL_TABLET | SUBLINGUAL | Status: DC | PRN

## 2017-10-14 MED ORDER — CITALOPRAM HYDROBROMIDE 20 MG PO TABS
20.0000 mg | ORAL_TABLET | Freq: Every day | ORAL | Status: DC
  Administered 2017-10-14 – 2017-10-16 (×3): 20 mg via ORAL
  Filled 2017-10-14 (×3): qty 1

## 2017-10-14 MED ORDER — PANTOPRAZOLE SODIUM 40 MG PO TBEC
40.0000 mg | DELAYED_RELEASE_TABLET | Freq: Every day | ORAL | Status: DC
  Administered 2017-10-14 – 2017-10-16 (×3): 40 mg via ORAL
  Filled 2017-10-14 (×3): qty 1

## 2017-10-14 MED ORDER — ACETAMINOPHEN 500 MG PO TABS
1000.0000 mg | ORAL_TABLET | Freq: Once | ORAL | Status: AC
  Administered 2017-10-14: 1000 mg via ORAL
  Filled 2017-10-14: qty 2

## 2017-10-14 MED ORDER — LORAZEPAM 0.5 MG PO TABS
0.5000 mg | ORAL_TABLET | Freq: Every morning | ORAL | Status: DC
  Administered 2017-10-15 – 2017-10-16 (×2): 0.5 mg via ORAL
  Filled 2017-10-14 (×2): qty 1

## 2017-10-14 MED ORDER — POTASSIUM CHLORIDE CRYS ER 20 MEQ PO TBCR
20.0000 meq | EXTENDED_RELEASE_TABLET | ORAL | Status: DC
  Administered 2017-10-14 – 2017-10-16 (×2): 20 meq via ORAL
  Filled 2017-10-14 (×2): qty 1

## 2017-10-14 MED ORDER — SODIUM CHLORIDE 0.9% FLUSH: 3.0000 mL | INTRAVENOUS | Status: DC | PRN

## 2017-10-14 MED ORDER — MAGNESIUM HYDROXIDE 400 MG/5ML PO SUSP: 30.0000 mL | Freq: Every day | ORAL | Status: DC | PRN

## 2017-10-14 MED ORDER — FUROSEMIDE 10 MG/ML IJ SOLN
40.0000 mg | INTRAMUSCULAR | Status: AC
  Administered 2017-10-14: 40 mg via INTRAVENOUS
  Filled 2017-10-14: qty 4

## 2017-10-14 MED ORDER — LORATADINE 10 MG PO TABS
10.0000 mg | ORAL_TABLET | Freq: Every day | ORAL | Status: DC
  Administered 2017-10-14 – 2017-10-16 (×3): 10 mg via ORAL
  Filled 2017-10-14 (×3): qty 1

## 2017-10-14 MED ORDER — LEVOTHYROXINE SODIUM 75 MCG PO TABS
75.0000 ug | ORAL_TABLET | Freq: Every day | ORAL | Status: DC
  Administered 2017-10-15 – 2017-10-16 (×2): 75 ug via ORAL
  Filled 2017-10-14 (×2): qty 1

## 2017-10-14 MED ORDER — ONDANSETRON HCL 4 MG/2ML IJ SOLN: 4.0000 mg | Freq: Four times a day (QID) | INTRAMUSCULAR | Status: DC | PRN

## 2017-10-14 NOTE — ED Notes (Signed)
ED Provider at bedside. 

## 2017-10-14 NOTE — ED Notes (Signed)
Patient transported to X-ray 

## 2017-10-14 NOTE — ED Notes (Signed)
purewick catheter placed on patient 

## 2017-10-14 NOTE — Progress Notes (Signed)
New pt admission from ED. Pt brought to the floor in stable condition. Vitals taken. Initial Assessment done. All immediate pertinent needs to patient addressed. Patient Guide given to patient. Important safety instructions relating to hospitalization reviewed with patient. Patient verbalized understanding. Will continue to monitor pt. 

## 2017-10-14 NOTE — ED Notes (Signed)
Admitting MD at bedside.

## 2017-10-14 NOTE — Progress Notes (Signed)
MRSA PCR sent 

## 2017-10-14 NOTE — ED Notes (Signed)
Report called to 3E and rec'd by [facility].

## 2017-10-14 NOTE — ED Notes (Signed)
Pt given orange juice per request.

## 2017-10-14 NOTE — ED Provider Notes (Signed)
[name] Provider Note   CSN: [id number] Arrival date & time: 10/14/17  7741     History   Chief Complaint Chief Complaint  Patient presents with  . Chest Pain    HPI [name] is a 83 y.o. female.  83 year old female with past medical history including atrial fibrillation on Eliquis, CHF, hypertension, hyperlipidemia, GERD who presents with chest pain.  Patient states that she was awakened at 5 AM with central chest pain that she describes as a pressure/tightness that occasionally radiates to her back.  No ripping or tearing chest pain.  She reports mild associated shortness of breath and nausea.  No abdominal pain or vomiting.  She took 2 nitroglycerin with no improvement.  She denies any fevers, cough, lower extremity edema, or recent illness.  She eventually called EMS at her nursing facility.  Right now her pain is very mild.  She states that the pain fluctuates.   The history is provided by the patient.    Past Medical History:  Diagnosis Date  . Anemia   . Atrial fibrillation ([place])    a. Dx 01/2014->Eliquis started.  . Cancer ([place])   . Chest pain    a. 2011 Neg MV;  b. 01/2014 Cath: LM 20-30, LAD nl, D1 nl, LCX nl, OM1 nl, RCA dom 30-37m, PD/PL nl, EF 65%->Med Rx; c. 03/2016 MV: no ischemia/infarct. EF 63%.  . Degenerative joint disease   . Diastolic CHF, acute on chronic ([place]) 01/31/2012  . Dyslipidemia   . Dysrhythmia   . Family history of adverse reaction to anesthesia    " multiple family members have difficulty waking "  . GERD (gastroesophageal reflux disease)   . Hemorrhoid   . History of skin cancer    tafeen/ non melanoma.   Marland Kitchen Hx of varicella   . Hypertension   . Hypothyroidism   . Monoclonal gammopathies   . Pacemaker   . Palpitations    PVCs/bigeminy on event in May 2009 revealing this was relatively asymptomatic  . Right lower quadrant abdominal pain 07/19/2012   Recurrent  With nausea    This is new  since she had ct for llq pain in [facility]    R/o appendiceal problem  Hernia  Get ct scan  And plan  Fu     . Thrombocytopenia [facility])     Patient Active Problem List   Diagnosis Date Noted  . CAD (coronary artery disease) 07/24/2017  . CKD (chronic kidney disease), stage IV ([place]) 07/24/2017  . Dementia 07/24/2017  . Atrial fibrillation, chronic ([place]) 07/24/2017  . Monoclonal gammopathy of unknown significance (MGUS) 07/24/2017  . Thrombocytopenia, idiopathic ([place]) 07/24/2017  . Pulmonary HTN ([place]) 07/24/2017  . Mild mitral regurgitation 07/24/2017  . Idiopathic hypotension   . Fall 05/27/2017  . Mitral regurgitation 03/10/2017  . Mitral stenosis 03/10/2017  . Chest pain 03/08/2017  . Dehydration 11/18/2016  . Intractable nausea and vomiting 11/18/2016  . Generalized weakness 11/18/2016  . Hypokalemia 11/11/2016  . Hypertensive heart disease 04/01/2016  . PAF (paroxysmal atrial fibrillation) ([place]) 03/31/2016  . Chest pain with moderate risk for cardiac etiology, negative MI and Nuc study, + muscular skeletal   . Atypical chest pain 01/21/2016  . CKD (chronic kidney disease) stage 4, GFR 15-29 ml/min (HCC) 05/09/2015  . Anemia of chronic disease 05/09/2015  . Dyslipidemia 05/09/2015  . Elevated troponin   . Iron deficiency anemia 11/21/2014  . Pacemaker 04/27/2014  . Chronic anticoagulation 02/25/2014  .  Chronic diastolic CHF (congestive heart failure) ([place]) 02/24/2014  . Atrial fibrillation ([place]) 01/29/2014  . MGUS (monoclonal gammopathy of unknown significance) 08/01/2013  . Thrombocytopenia ([place])   . Hypothyroidism 02/19/2012  . Hyperlipidemia 12/15/2008  . HYPERTENSION, BENIGN 12/15/2008    Past Surgical History:  Procedure Laterality Date  . CARDIOVERSION N/A 02/26/2014   Procedure: CARDIOVERSION AT BEDSIDE;  Surgeon: Pixie Casino, MD;  Location: [place];  Service: Cardiovascular;  Laterality: N/A;  . CARDIOVERSION N/A 04/22/2014   Procedure: CARDIOVERSION (BEDSIDE);   Surgeon: [name], MD;  Location: [facility] OR;  Service: Cardiovascular;  Laterality: N/A;  . CATARACT EXTRACTION     Bilateral  implantt  . CESAREAN SECTION     times 2  . CHOLECYSTECTOMY  1999  . ESOPHAGOGASTRODUODENOSCOPY N/A 07/06/2014   Procedure: ESOPHAGOGASTRODUODENOSCOPY (EGD);  Surgeon: Ladene Artist, MD;  Location: [facility] ENDOSCOPY;  Service: Endoscopy;  Laterality: N/A;  . INSERT / REPLACE / REMOVE PACEMAKER    . LEFT HEART CATHETERIZATION WITH CORONARY ANGIOGRAM N/A 01/29/2014   Procedure: LEFT HEART CATHETERIZATION WITH CORONARY ANGIOGRAM;  Surgeon: Blane Ohara, MD;  Location[facility] CATH LAB;  Service: Cardiovascular;  Laterality: N/A;  . PACEMAKER INSERTION  04/23/2014   STJ Assurity dual chamber pacemaker implanted by Dr Rayann Heman  . PERMANENT PACEMAKER INSERTION N/A 04/23/2014   Procedure: PERMANENT PACEMAKER INSERTION;  Surgeon: Evans Lance, MD;  Location: [facility] CATH LAB;  Service: Cardiovascular;  Laterality: N/A;  . RIGHT HEART CATHETERIZATION N/A 09/22/2014   Procedure: RIGHT HEART CATH;  Surgeon: Jolaine Artist, MD;  Location: [facility] CATH LAB;  Service: Cardiovascular;  Laterality: N/A;  . SHOULDER SURGERY  1996   Right     OB History    Gravida Para Term Preterm AB Living   3 3 3          SAB TAB Ectopic Multiple Live Births                   Home Medications    Prior to Admission medications   Medication Sig Start Date End Date Taking? Authorizing Provider  amiodarone (PACERONE) 200 MG tablet Take 1 tablet (200 mg total) by mouth daily. 11/25/16  Yes Bensimhon, Shaune Pascal, MD  citalopram (CELEXA) 20 MG tablet Take 20 mg by mouth daily.   Yes [provider]  ELIQUIS 2.5 MG TABS tablet TAKE ONE TABLET BY MOUTH TWICE DAILY Patient taking differently: Take 2.5 mg by mouth two times a day 05/22/17  Yes Bensimhon, Shaune Pascal, MD  Ferrous Sulfate (IRON) 325 (65 Fe) MG TABS Take 1 tablet by mouth daily. 07/01/16  Yes Johnson, Clanford L, MD  lactose free nutrition  (BOOST) LIQD Take 237 mLs by mouth 2 (two) times daily between meals.   Yes [provider]  levothyroxine (SYNTHROID, LEVOTHROID) 75 MCG tablet TAKE 1 TABLET BY MOUTH ONCE DAILY 05/22/17  Yes Panosh, Standley Brooking, MD  loratadine (CLARITIN) 10 MG tablet Take 1 tablet (10 mg total) by mouth daily. 03/10/17  Yes Jani Gravel, MD  LORazepam (ATIVAN) 0.5 MG tablet Take 0.5 mg by mouth every morning.    Yes [provider]  pantoprazole (PROTONIX) 40 MG tablet Take 1 tablet (40 mg total) by mouth daily. 08/19/17  Yes Duffy Bruce, MD  polyethylene glycol [facility]) packet Take 17 g by mouth daily. Mix with 8 ounces of liquid. Patient taking differently: Take 17 g by mouth every Monday, Wednesday, and Friday. Mix with 8 ounces of liquid. 08/13/17  Yes Malvin Johns, MD  potassium chloride SA (K-DUR,KLOR-CON) 20 MEQ tablet Take 1 tablet (20 mEq total) by mouth daily. Take an additional 5meq with Metolazone. Patient taking differently: Take 20 mEq by mouth every other day.  06/09/17  Yes Pattricia Boss, MD  simvastatin (ZOCOR) 10 MG tablet TAKE ONE TABLET BY MOUTH AT BEDTIME 02/14/17  Yes Larey Dresser, MD  torsemide (DEMADEX) 20 MG tablet Take 1 tablet (20 mg total) by mouth daily. Please start on 11/22/2016 Patient taking differently: Take 20 mg by mouth every other day.  06/09/17  Yes Pattricia Boss, MD  acetaminophen (TYLENOL) 325 MG tablet Take 650 mg by mouth every 4 (four) hours as needed (for pain).     [provider]  bisacodyl (DULCOLAX) 10 MG suppository Place 10 mg rectally as needed for moderate constipation.    [provider]  citalopram (CELEXA) 10 MG tablet Take 10 mg by mouth daily.    [provider]  docusate sodium (COLACE) 50 MG capsule Take 50 mg by mouth daily.    [provider]  magnesium hydroxide (MILK OF MAGNESIA) 400 MG/5ML suspension Take 30 mLs by mouth daily as needed for mild constipation.    [provider]  meclizine  (ANTIVERT) 12.5 MG tablet Take 1 tablet (12.5 mg total) by mouth 2 (two) times daily as needed for dizziness. 09/14/16   Bensimhon, Shaune Pascal, MD  metolazone (ZAROXOLYN) 2.5 MG tablet Take 1 tablet (2.5 mg total) by mouth as needed (for weight 134 lb or greater). Patient taking differently: Take 2.5 mg by mouth daily as needed (if weight is 134 pounds or greater).  03/09/16   Bensimhon, Shaune Pascal, MD  nitroGLYCERIN (NITROSTAT) 0.4 MG SL tablet Place 1 tablet (0.4 mg total) under the tongue every 5 (five) minutes as needed. For chest pain Patient not taking: Reported on 10/14/2017 05/06/16   Bensimhon, Shaune Pascal, MD    Family History Family History  Problem Relation Age of Onset  . Coronary artery disease Father   . Heart disease Father   . Heart attack Father   . Alzheimer's disease Mother   . Lung cancer Brother 70  . Lung cancer Unknown     Social History Social History   Tobacco Use  . Smoking status: Never Smoker  . Smokeless tobacco: Never Used  Substance Use Topics  . Alcohol use: No  . Drug use: No     Allergies   Other; Tramadol; and Ibuprofen   Review of Systems Review of Systems All other systems reviewed and are negative except that which was mentioned in HPI   Physical Exam Updated Vital Signs BP 119/74   Pulse 70   Temp 98.3 F (36.8 C) (Oral)   Resp 18   SpO2 94%   Physical Exam  Constitutional: She is oriented to person, place, and time. She appears well-developed and well-nourished. No distress.  HENT:  Head: Normocephalic and atraumatic.  Moist mucous membranes  Eyes: Conjunctivae are normal.  R pupil slightly smaller than left, both round and reactive to light  Neck: Neck supple.  Cardiovascular: Normal rate and normal heart sounds. An irregularly irregular rhythm present.  No murmur heard. Pulmonary/Chest: Effort normal and breath sounds normal.  Abdominal: Soft. Bowel sounds are normal. She exhibits no distension. There is tenderness (mild  epigastric). There is no rebound and no guarding.  Musculoskeletal: She exhibits no edema.  Neurological: She is alert and oriented to person, place, and time.  Fluent speech  Skin: Skin is warm and dry.  Psychiatric: She has a normal mood and affect. Judgment normal.  Nursing note and vitals reviewed.    ED Treatments / Results  Labs (all labs ordered are listed, but only abnormal results are displayed) Labs Reviewed  COMPREHENSIVE METABOLIC PANEL - Abnormal; Notable for the following components:      Result Value   Glucose, Bld 106 (*)    BUN 33 (*)    Creatinine, Ser 1.73 (*)    Total Protein 6.1 (*)    Albumin 3.0 (*)    GFR calc non Af Amer 25 (*)    GFR calc Af Amer 29 (*)    All other components within normal limits  CBC - Abnormal; Notable for the following components:   RBC 3.85 (*)    Platelets 122 (*)    All other components within normal limits  BRAIN NATRIURETIC PEPTIDE - Abnormal; Notable for the following components:   B Natriuretic Peptide 553.2 (*)    All other components within normal limits  LIPASE, BLOOD  CBC  CREATININE, SERUM  I-STAT TROPONIN, ED  I-STAT TROPONIN, ED    EKG  EKG Interpretation  Date/Time:  Saturday October 14 2017 08:45:50 EST Ventricular Rate:  84 PR Interval:    QRS Duration: 93 QT Interval:  437 QTC Calculation: 517 R Axis:   11 Text Interpretation:  Ventricular-paced complexes No further rhythm analysis attempted due to paced rhythm Consider right atrial enlargement Low voltage, precordial leads Nonspecific T abnormalities, lateral leads Borderline ST elevation, lateral leads Prolonged QT interval Baseline wander in lead(s) I III aVL Artifact occasional PVC Confirmed by Theotis Burrow [phone]) on 10/14/2017 10:27:43 AM       Radiology Dg Chest 2 View  Result Date: 10/14/2017 CLINICAL DATA:  Chest pain, shortness of Breath EXAM: CHEST  2 VIEW COMPARISON:  09/02/2017 FINDINGS: Right pacer remains in place, unchanged.  Cardiomegaly with vascular congestion. Interstitial prominence likely reflects early interstitial edema. No effusions. No acute bony abnormality. IMPRESSION: Cardiomegaly.  Suspect mild interstitial edema/CHF. Electronically Signed   By: Rolm Baptise M.D.   On: 10/14/2017 09:58    Procedures Procedures (including critical care time)  Medications Ordered in ED Medications  furosemide (LASIX) injection 40 mg (40 mg Intravenous Given 10/14/17 1159)  acetaminophen (TYLENOL) tablet 1,000 mg (1,000 mg Oral Given 10/14/17 1322)     Initial Impression / Assessment and Plan / ED Course  I have reviewed the triage vital signs and the nursing notes.  Pertinent labs & imaging results that were available during my care of the patient were reviewed by me and considered in my medical decision making (see chart for details).    PT w/ CHF p/w chest pain and shortness of breath that woke her from sleep this morning.  She was well-appearing on exam with normal vital signs.  EKG without acute ischemia.  Initial troponin was normal.  Labs show creatinine similar to baseline, BNP elevated at 553.  I also suggest CHF exacerbation with mild interstitial edema.  Her oxygen level is been in the 90s on room air here.  I gave a dose of IV Lasix.  Because she has continued to have intermittent chest pain, I discussed her case with cardiology, Dr. Wynonia Lawman.  He recommended medicine admission for serial troponins and diuresis.  Discussed admission with hospitalist Dr. Evangeline Gula and patient admitted for further care.  Final Clinical Impressions(s) / ED Diagnoses   Final diagnoses:  Acute on chronic congestive heart  failure, unspecified heart failure type [facility])    ED Discharge Orders    None       Breslin Hemann, Wenda Overland, MD 10/14/17 1627

## 2017-10-14 NOTE — H&P (Signed)
History and Physical    [name]:[id number] DOB: 26-Jun-1925 DOA: 10/14/2017   PCP: Burnis Medin, MD   Patient coming from:  Home    Chief Complaint: Chest pain   HPI: [name] is a 83 y.o. female with medical history significant for congestive heart failure with preserved ejection fraction , history of atrial fibrillation on Eliquis, hypertension, hyperlipidemia, GERD, presenting with chest pain waking her up around 5 in the morning, central, which she described as pressure, tightness occasionally radiating to the back.  She denies any ripping or tearing chest pain. She also reported mild associated shortness of breath, with nausea but no vomiting.  She denies any abdominal pain.  Patient took 2 nitroglycerin with no improvement.  She denies any fever cough lower extremity edema or recent illnesses.  No recent hospitalizations.  To the symptoms, EMS brought her to the ED from her nursing facility for further evaluation.  At the time of evaluation, her pain is very mild, and fluctuates.   ED Course:  BP 119/73   Pulse 73   Temp 98.3 F (36.8 C) (Oral)   Resp (!) 21   SpO2 92%    BNP 553.2 Glucose 106 Creatinine 1.73 with GFR 25 Platelets 122  EKG Ventricular-paced complexes No further rhythm analysis attempted due to paced rhythm  Chest x-ray showed cardiomegaly, with mild interstitial edema-CHF noted. Received Lasix 40 mg IV x1   Review of Systems: As per HPI otherwise 10 point review of systems negative.   Past Medical History:  Diagnosis Date  . Anemia   . Atrial fibrillation ([place])    a. Dx 01/2014->Eliquis started.  . Cancer ([place])   . Chest pain    a. 2011 Neg MV;  b. 01/2014 Cath: LM 20-30, LAD nl, D1 nl, LCX nl, OM1 nl, RCA dom 30-64m, PD/PL nl, EF 65%->Med Rx; c. 03/2016 MV: no ischemia/infarct. EF 63%.  . Degenerative joint disease   . Diastolic CHF, acute on chronic ([place]) 01/31/2012  . Dyslipidemia   . Dysrhythmia   . Family history of adverse  reaction to anesthesia    " multiple family members have difficulty waking "  . GERD (gastroesophageal reflux disease)   . Hemorrhoid   . History of skin cancer    tafeen/ non melanoma.   Marland Kitchen Hx of varicella   . Hypertension   . Hypothyroidism   . Monoclonal gammopathies   . Pacemaker   . Palpitations    PVCs/bigeminy on event in May 2009 revealing this was relatively asymptomatic  . Right lower quadrant abdominal pain 07/19/2012   Recurrent  With nausea    This is new since she had ct for llq pain in [facility]    R/o appendiceal problem  Hernia  Get ct scan  And plan  Fu     . Thrombocytopenia ([place])     Past Surgical History:  Procedure Laterality Date  . CARDIOVERSION N/A 02/26/2014   Procedure: CARDIOVERSION AT BEDSIDE;  Surgeon: Pixie Casino, MD;  Location: [place];  Service: Cardiovascular;  Laterality: N/A;  . CARDIOVERSION N/A 04/22/2014   Procedure: CARDIOVERSION (BEDSIDE);  Surgeon: [name], MD;  Location: [facility] OR;  Service: Cardiovascular;  Laterality: N/A;  . CATARACT EXTRACTION     Bilateral  implantt  . CESAREAN SECTION     times 2  . CHOLECYSTECTOMY  1999  . ESOPHAGOGASTRODUODENOSCOPY N/A 07/06/2014   Procedure: ESOPHAGOGASTRODUODENOSCOPY (EGD);  Surgeon: Ladene Artist, MD;  Location: [facility] ENDOSCOPY;  Service: Endoscopy;  Laterality: N/A;  . INSERT / REPLACE / REMOVE PACEMAKER    . LEFT HEART CATHETERIZATION WITH CORONARY ANGIOGRAM N/A 01/29/2014   Procedure: LEFT HEART CATHETERIZATION WITH CORONARY ANGIOGRAM;  Surgeon: Blane Ohara, MD;  Location[facility] CATH LAB;  Service: Cardiovascular;  Laterality: N/A;  . PACEMAKER INSERTION  04/23/2014   STJ Assurity dual chamber pacemaker implanted by Dr Rayann Heman  . PERMANENT PACEMAKER INSERTION N/A 04/23/2014   Procedure: PERMANENT PACEMAKER INSERTION;  Surgeon: Evans Lance, MD;  Location: [facility] CATH LAB;  Service: Cardiovascular;  Laterality: N/A;  . RIGHT HEART CATHETERIZATION N/A 09/22/2014   Procedure: RIGHT HEART CATH;   Surgeon: Jolaine Artist, MD;  Location: [facility] CATH LAB;  Service: Cardiovascular;  Laterality: N/A;  . SHOULDER SURGERY  1996   Right     Social History Social History   Socioeconomic History  . Marital status: Widowed    Spouse name: Not on file  . Number of children: 3  . Years of education: Not on file  . Highest education level: Not on file  Social Needs  . Financial resource strain: Not on file  . Food insecurity - worry: Not on file  . Food insecurity - inability: Not on file  . Transportation needs - medical: Not on file  . Transportation needs - non-medical: Not on file  Occupational History    Employer: RETIRED    Comment: [employer], book keeping  Tobacco Use  . Smoking status: Never Smoker  . Smokeless tobacco: Never Used  Substance and Sexual Activity  . Alcohol use: No  . Drug use: No  . Sexual activity: No  Other Topics Concern  . 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 of 1  Has 2 labs    Neg tad [place]    G3P3      Daughter and gets some of her food and cooks at her house . Eats with her.      Daughter Joseph Art handles medications. Sees HH and PT once a week.   Helper  [place]  there Friday 1-3 pm   Pt lives alone, Joseph Art lives 30 min away.  05-15-17     Allergies  Allergen Reactions  . Other Shortness Of Breath    Detergents, perfumes and other strong, odor-emitting compounds  . Tramadol Shortness Of Breath and Nausea Only  . Ibuprofen Other (See Comments)    Listed as an allergy on MAR    Family History  Problem Relation Age of Onset  . Coronary artery disease Father   . Heart disease Father   . Heart attack Father   . Alzheimer's disease Mother   . Lung cancer Brother 19  . Lung cancer Unknown       Prior to Admission medications   Medication Sig Start Date End Date Taking? Authorizing Provider  amiodarone  (PACERONE) 200 MG tablet Take 1 tablet (200 mg total) by mouth daily. 11/25/16  Yes Bensimhon, Shaune Pascal, MD  citalopram (CELEXA) 20 MG tablet Take 20 mg by mouth daily.   Yes [provider]  ELIQUIS 2.5 MG TABS tablet TAKE ONE TABLET BY MOUTH TWICE DAILY Patient taking differently: Take 2.5 mg by mouth two times a day 05/22/17  Yes Bensimhon, Shaune Pascal, MD  Ferrous Sulfate (IRON) 325 (65 Fe) MG TABS Take 1 tablet by mouth daily.  07/01/16  Yes Johnson, Clanford L, MD  lactose free nutrition (BOOST) LIQD Take 237 mLs by mouth 2 (two) times daily between meals.   Yes [provider]  levothyroxine (SYNTHROID, LEVOTHROID) 75 MCG tablet TAKE 1 TABLET BY MOUTH ONCE DAILY 05/22/17  Yes Panosh, Standley Brooking, MD  loratadine (CLARITIN) 10 MG tablet Take 1 tablet (10 mg total) by mouth daily. 03/10/17  Yes Jani Gravel, MD  LORazepam (ATIVAN) 0.5 MG tablet Take 0.5 mg by mouth every morning.    Yes [provider]  pantoprazole (PROTONIX) 40 MG tablet Take 1 tablet (40 mg total) by mouth daily. 08/19/17  Yes Duffy Bruce, MD  polyethylene glycol [facility]) packet Take 17 g by mouth daily. Mix with 8 ounces of liquid. Patient taking differently: Take 17 g by mouth every Monday, Wednesday, and Friday. Mix with 8 ounces of liquid. 08/13/17  Yes Malvin Johns, MD  potassium chloride SA (K-DUR,KLOR-CON) 20 MEQ tablet Take 1 tablet (20 mEq total) by mouth daily. Take an additional 77meq with Metolazone. Patient taking differently: Take 20 mEq by mouth every other day.  06/09/17  Yes Pattricia Boss, MD  simvastatin (ZOCOR) 10 MG tablet TAKE ONE TABLET BY MOUTH AT BEDTIME 02/14/17  Yes Larey Dresser, MD  torsemide (DEMADEX) 20 MG tablet Take 1 tablet (20 mg total) by mouth daily. Please start on 11/22/2016 Patient taking differently: Take 20 mg by mouth every other day.  06/09/17  Yes Pattricia Boss, MD  acetaminophen (TYLENOL) 325 MG tablet Take 650 mg by mouth every 4 (four) hours as needed (for pain).      [provider]  bisacodyl (DULCOLAX) 10 MG suppository Place 10 mg rectally as needed for moderate constipation.    [provider]  citalopram (CELEXA) 10 MG tablet Take 10 mg by mouth daily.    [provider]  docusate sodium (COLACE) 50 MG capsule Take 50 mg by mouth daily.    [provider]  magnesium hydroxide (MILK OF MAGNESIA) 400 MG/5ML suspension Take 30 mLs by mouth daily as needed for mild constipation.    [provider]  meclizine (ANTIVERT) 12.5 MG tablet Take 1 tablet (12.5 mg total) by mouth 2 (two) times daily as needed for dizziness. 09/14/16   Bensimhon, Shaune Pascal, MD  metolazone (ZAROXOLYN) 2.5 MG tablet Take 1 tablet (2.5 mg total) by mouth as needed (for weight 134 lb or greater). Patient taking differently: Take 2.5 mg by mouth daily as needed (if weight is 134 pounds or greater).  03/09/16   Bensimhon, Shaune Pascal, MD  nitroGLYCERIN (NITROSTAT) 0.4 MG SL tablet Place 1 tablet (0.4 mg total) under the tongue every 5 (five) minutes as needed. For chest pain Patient not taking: Reported on 10/14/2017 05/06/16   Bensimhon, Shaune Pascal, MD    Physical Exam:  Vitals:   10/14/17 1400 10/14/17 1415 10/14/17 1430 10/14/17 1445  BP: 117/74 119/79 119/74 119/73  Pulse: 83 80 70 73  Resp: 19 17 18  (!) 21  Temp:      TempSrc:      SpO2: 94% 94%  92%   Constitutional: NAD, calm, comfortable  Eyes: PERRL, lids and conjunctivae normal ENMT: Mucous membranes are moist, without exudate or lesions  Neck: normal, supple, no masses, no thyromegaly Respiratory: clear to auscultation bilaterally, no wheezing, bibasilar crackles. Normal respiratory effort  Cardiovascular: Irregularly irregular  rate and rhythm, no murmurs, rubs or gallops. No extremity edema. 2+ pedal pulses. No carotid bruits.  Abdomen: Soft,  non tender, No hepatosplenomegaly. Bowel sounds positive.  Musculoskeletal: no clubbing / cyanosis. Moves all extremities Skin: no jaundice,  No lesions.  Neurologic: Sensation intact  Strength equal in all extremities Psychiatric:   Alert and oriented x 3. Normal mood.     Labs on Admission: I have personally reviewed following labs and imaging studies  CBC: Recent Labs  Lab 10/14/17 0930  WBC 5.0  HGB 12.2  HCT 38.2  MCV 99.2  PLT 122*    Basic Metabolic Panel: Recent Labs  Lab 10/14/17 0930  NA 137  K 4.5  CL 106  CO2 23  GLUCOSE 106*  BUN 33*  CREATININE 1.73*  CALCIUM 9.5    GFR: CrCl cannot be calculated (Unknown ideal weight.).  Liver Function Tests: Recent Labs  Lab 10/14/17 0930  AST 31  ALT 20  ALKPHOS 88  BILITOT 0.8  PROT 6.1*  ALBUMIN 3.0*   Recent Labs  Lab 10/14/17 0930  LIPASE 29   No results for input(s): AMMONIA in the last 168 hours.  Coagulation Profile: No results for input(s): INR, PROTIME in the last 168 hours.  Cardiac Enzymes: No results for input(s): CKTOTAL, CKMB, CKMBINDEX, TROPONINI in the last 168 hours.  BNP (last 3 results) No results for input(s): PROBNP in the last 8760 hours.  HbA1C: No results for input(s): HGBA1C in the last 72 hours.  CBG: No results for input(s): GLUCAP in the last 168 hours.  Lipid Profile: No results for input(s): CHOL, HDL, LDLCALC, TRIG, CHOLHDL, LDLDIRECT in the last 72 hours.  Thyroid Function Tests: No results for input(s): TSH, T4TOTAL, FREET4, T3FREE, THYROIDAB in the last 72 hours.  Anemia Panel: No results for input(s): VITAMINB12, FOLATE, FERRITIN, TIBC, IRON, RETICCTPCT in the last 72 hours.  Urine analysis:    Component Value Date/Time   COLORURINE STRAW (A) 08/13/2017 1248   APPEARANCEUR CLEAR 08/13/2017 1248   LABSPEC 1.006 08/13/2017 1248   PHURINE 5.0 08/13/2017 1248   GLUCOSEU NEGATIVE 08/13/2017 1248   GLUCOSEU NEGATIVE 12/19/2011 0956   HGBUR NEGATIVE 08/13/2017 1248   BILIRUBINUR NEGATIVE 08/13/2017 1248   BILIRUBINUR neg 02/27/2017 1618   KETONESUR NEGATIVE 08/13/2017 1248   PROTEINUR  NEGATIVE 08/13/2017 1248   UROBILINOGEN 0.2 02/27/2017 1618   UROBILINOGEN 0.2 09/10/2015 1150   NITRITE NEGATIVE 08/13/2017 1248   LEUKOCYTESUR NEGATIVE 08/13/2017 1248    Sepsis Labs: @LABRCNTIP (procalcitonin:4,lacticidven:4) )No results found for this or any previous visit (from the past 240 hour(s)).   Radiological Exams on Admission: Dg Chest 2 View  Result Date: 10/14/2017 CLINICAL DATA:  Chest pain, shortness of Breath EXAM: CHEST  2 VIEW COMPARISON:  09/02/2017 FINDINGS: Right pacer remains in place, unchanged. Cardiomegaly with vascular congestion. Interstitial prominence likely reflects early interstitial edema. No effusions. No acute bony abnormality. IMPRESSION: Cardiomegaly.  Suspect mild interstitial edema/CHF. Electronically Signed   By: Rolm Baptise M.D.   On: 10/14/2017 09:58    EKG: Independently reviewed.  Assessment/Plan Active Problems:   Hyperlipidemia   HYPERTENSION, BENIGN   Hypothyroidism   MGUS (monoclonal gammopathy of unknown significance)   Atrial fibrillation (HCC)   Chronic diastolic CHF (congestive heart failure) (HCC)   Chronic anticoagulation   Iron deficiency anemia   CKD (chronic kidney disease) stage 4, GFR 15-29 ml/min (HCC)   Anemia of chronic disease   CHF exacerbation (HCC)   PAF (paroxysmal atrial fibrillation) (HCC)   CAD (coronary artery disease)  Diastolic CHF exacerbation.  Chest x-ray with cardiomegaly, mild interstitial changes consistent with CHF.  Last 2D echo performed in September 2018 shows EF of 68-11%, normal systolic.  BNP 553.  Patient received Lasix 40 mg IV x1.  She is overall stable, symptoms have improved.  Telemetry CHForder set  Cycle troponins   Daily weights and strict I/O Continue Lasix IV twice daily.  Chest pain syndrome, in the setting of diastolic CHF exacerbation as above.  Initial troponin was 0.07, with repeat 0.06   EKG without any significant changes.  Rest of the workup is negative.  Took  nitroglycerin without any relief.   Cycle troponins EKG in am continue ASA, O2 and NTG as needed Give lasix now in ED and again in am If symptoms worsen, the patient troponins continue to increase, will request cardiology evaluation.   Hypertension BP 119/73   Pulse 73    Controlled Continue home anti-hypertensive medications   Hyperlipidemia Continue home statins   Depression Continue Celexa   GERD, no acute symptoms Continue PPI  Anemia of chronic disease Hemoglobin on admission 12.2 stable  . Continue Iron supplements   Chronic kidney disease stage  4 current creatinine is 1.73, baseline is between 1.84 and 2.11.   Lab Results  Component Value Date   CREATININE 1.73 (H) 10/14/2017   CREATININE 1.84 (H) 09/02/2017   CREATININE 2.11 (H) 08/19/2017  Continue to monitor, labs in a.m.  Hypothyroidism: Continue home Synthroid  Atrial Fibrillation  on anticoagulation with Eliquis EKG without changes Continue meds, including Eliquis     DVT prophylaxis: Eliquis  Code Status:   Full  Family Communication:  Discussed with patient Disposition Plan: Expect patient to be discharged to home after condition improves Consults called:    None  Admission status:Tel  Obs    Lady Deutscher, PA-C [facility]   10/14/2017, 4:09 PM

## 2017-10-14 NOTE — ED Notes (Signed)
Dr. Rex Kras made aware of patient's request for some for her chronic back pain.

## 2017-10-14 NOTE — ED Triage Notes (Signed)
Pt arrives via [facility] from [facility], pt c/o cp since 0500 with no radiation. Pt received a total of 4 sl nitro, 324 mg asa with no pain relief. A/ox4 resp e/u, nad.

## 2017-10-15 ENCOUNTER — Other Ambulatory Visit: Payer: Self-pay

## 2017-10-15 DIAGNOSIS — D509 Iron deficiency anemia, unspecified: Secondary | ICD-10-CM | POA: Diagnosis present

## 2017-10-15 DIAGNOSIS — F329 Major depressive disorder, single episode, unspecified: Secondary | ICD-10-CM | POA: Diagnosis present

## 2017-10-15 DIAGNOSIS — D696 Thrombocytopenia, unspecified: Secondary | ICD-10-CM | POA: Diagnosis present

## 2017-10-15 DIAGNOSIS — I509 Heart failure, unspecified: Secondary | ICD-10-CM

## 2017-10-15 DIAGNOSIS — Z886 Allergy status to analgesic agent status: Secondary | ICD-10-CM | POA: Diagnosis not present

## 2017-10-15 DIAGNOSIS — E039 Hypothyroidism, unspecified: Secondary | ICD-10-CM

## 2017-10-15 DIAGNOSIS — Z885 Allergy status to narcotic agent status: Secondary | ICD-10-CM | POA: Diagnosis not present

## 2017-10-15 DIAGNOSIS — I248 Other forms of acute ischemic heart disease: Secondary | ICD-10-CM | POA: Diagnosis not present

## 2017-10-15 DIAGNOSIS — Z950 Presence of cardiac pacemaker: Secondary | ICD-10-CM | POA: Diagnosis not present

## 2017-10-15 DIAGNOSIS — I480 Paroxysmal atrial fibrillation: Secondary | ICD-10-CM | POA: Diagnosis present

## 2017-10-15 DIAGNOSIS — D472 Monoclonal gammopathy: Secondary | ICD-10-CM

## 2017-10-15 DIAGNOSIS — I5032 Chronic diastolic (congestive) heart failure: Secondary | ICD-10-CM

## 2017-10-15 DIAGNOSIS — R0789 Other chest pain: Secondary | ICD-10-CM | POA: Diagnosis not present

## 2017-10-15 DIAGNOSIS — E785 Hyperlipidemia, unspecified: Secondary | ICD-10-CM | POA: Diagnosis present

## 2017-10-15 DIAGNOSIS — K219 Gastro-esophageal reflux disease without esophagitis: Secondary | ICD-10-CM | POA: Diagnosis present

## 2017-10-15 DIAGNOSIS — I130 Hypertensive heart and chronic kidney disease with heart failure and stage 1 through stage 4 chronic kidney disease, or unspecified chronic kidney disease: Secondary | ICD-10-CM | POA: Diagnosis present

## 2017-10-15 DIAGNOSIS — D638 Anemia in other chronic diseases classified elsewhere: Secondary | ICD-10-CM

## 2017-10-15 DIAGNOSIS — I10 Essential (primary) hypertension: Secondary | ICD-10-CM | POA: Diagnosis not present

## 2017-10-15 DIAGNOSIS — R079 Chest pain, unspecified: Secondary | ICD-10-CM | POA: Diagnosis present

## 2017-10-15 DIAGNOSIS — N184 Chronic kidney disease, stage 4 (severe): Secondary | ICD-10-CM | POA: Diagnosis present

## 2017-10-15 DIAGNOSIS — I5033 Acute on chronic diastolic (congestive) heart failure: Secondary | ICD-10-CM | POA: Diagnosis present

## 2017-10-15 DIAGNOSIS — R4182 Altered mental status, unspecified: Secondary | ICD-10-CM | POA: Diagnosis present

## 2017-10-15 DIAGNOSIS — I482 Chronic atrial fibrillation: Secondary | ICD-10-CM

## 2017-10-15 DIAGNOSIS — Z79890 Hormone replacement therapy (postmenopausal): Secondary | ICD-10-CM | POA: Diagnosis not present

## 2017-10-15 DIAGNOSIS — Z7901 Long term (current) use of anticoagulants: Secondary | ICD-10-CM

## 2017-10-15 DIAGNOSIS — Z66 Do not resuscitate: Secondary | ICD-10-CM | POA: Diagnosis present

## 2017-10-15 DIAGNOSIS — D631 Anemia in chronic kidney disease: Secondary | ICD-10-CM | POA: Diagnosis present

## 2017-10-15 DIAGNOSIS — I2510 Atherosclerotic heart disease of native coronary artery without angina pectoris: Secondary | ICD-10-CM | POA: Diagnosis present

## 2017-10-15 LAB — URINALYSIS, ROUTINE W REFLEX MICROSCOPIC
BILIRUBIN URINE: NEGATIVE
Bacteria, UA: NONE SEEN
Glucose, UA: NEGATIVE mg/dL
Hgb urine dipstick: NEGATIVE
Ketones, ur: NEGATIVE mg/dL
Nitrite: NEGATIVE
PH: 5 (ref 5.0–8.0)
Protein, ur: NEGATIVE mg/dL
SPECIFIC GRAVITY, URINE: 1.012 (ref 1.005–1.030)

## 2017-10-15 LAB — BASIC METABOLIC PANEL
Anion gap: 7 (ref 5–15)
BUN: 31 mg/dL — AB (ref 6–20)
CHLORIDE: 107 mmol/L (ref 101–111)
CO2: 26 mmol/L (ref 22–32)
CREATININE: 1.73 mg/dL — AB (ref 0.44–1.00)
Calcium: 9.4 mg/dL (ref 8.9–10.3)
GFR calc Af Amer: 29 mL/min — ABNORMAL LOW (ref >60)
GFR calc non Af Amer: 25 mL/min — ABNORMAL LOW (ref >60)
Glucose, Bld: 81 mg/dL (ref 65–99)
POTASSIUM: 4.2 mmol/L (ref 3.5–5.1)
Sodium: 140 mmol/L (ref 135–145)

## 2017-10-15 LAB — TROPONIN I: Troponin I: 0.05 ng/mL (ref <0.03)

## 2017-10-15 NOTE — Consult Note (Signed)
Cardiology Consultation:   Patient ID: [name]; [id number]; Oct 16, 1925   Admit date: 10/14/2017 Date of Consult: 10/15/2017  Primary Care Provider: Burnis Medin, MD Primary Cardiologist: Dr. Haroldine Dickson Primary Electrophysiologist:  Dr. Lovena Dickson   Patient Profile:   [name] is a 83 y.o. female with a hx of diffuse nonobstructive CAD by cath 2015, atrial fibrillation on amio and eliquis 2.5 mg BID, chronic systolic and diastolic heart failure, pulmonary hypertension, s/p St Jude PPM, HTN, HLD, MGUS, thrombocytopenia, and CKD stage IV who is being seen today for the evaluation of chest pain at the request of Dr. Eliseo Dickson.  History of Present Illness:   [name] had a heart cath 01/2014 with nonobstructive CAD. She was admitted twice in 04/2014 with CHF exacerbation. She had DCCV 04/2014 with conversion to NSR, but was complicated by profound bradycardia requiring PPM implantation. This was further complicated by anemia requiring blood transfusion. Right heart cath in 09/2014; was admitted for IV diuresis. NM stress est 03/31/15 was low risk without reversible ischemia and normal LVEF.   She has had multiple admissions over the past 2-3 years for chest pain that is often atypical. She was seen in April 2018 - chest pain resolved with IV diuresis. She was recently seen by this service on consult during an admission on 07/25/17 for chest pain. No intervention at that time.   Device recently interrogated 08/23/17 with normal function.  On my interview, the patient states that she felt nauseous at her SNF and pulled the "help me" string. However, no staff member came to help her so she called EMS. On arrival, she reported transient chest pain. However, patient now denies ever feeling chest pain. She is no longer nauseous and wishes to discharge home. I discussed he recent stress test and heart cath as well as her kidney function. I discussed managing her conservatively without further  ischemic evaluation and she is in agreement. She does not want any further testing and wishes to discharge back to SNF.   Of note, she states that sometimes she gets confused. She is oriented to person, place, and situation today. She did not remember the date.  Past Medical History:  Diagnosis Date  . Anemia   . Atrial fibrillation ([place])    a. Dx 01/2014->Eliquis started.  . Cancer ([place])   . Chest pain    a. 2011 Neg MV;  b. 01/2014 Cath: LM 20-30, LAD nl, D1 nl, LCX nl, OM1 nl, RCA dom 30-45m, PD/PL nl, EF 65%->Med Rx; c. 03/2016 MV: no ischemia/infarct. EF 63%.  . Degenerative joint disease   . Diastolic CHF, acute on chronic ([place]) 01/31/2012  . Dyslipidemia   . Dysrhythmia   . Family history of adverse reaction to anesthesia    " multiple family members have difficulty waking "  . GERD (gastroesophageal reflux disease)   . Hemorrhoid   . History of skin cancer    tafeen/ non melanoma.   Marland Kitchen Hx of varicella   . Hypertension   . Hypothyroidism   . Monoclonal gammopathies   . Pacemaker   . Palpitations    PVCs/bigeminy on event in May 2009 revealing this was relatively asymptomatic  . Right lower quadrant abdominal pain 07/19/2012   Recurrent  With nausea    This is new since she had ct for llq pain in [facility]    R/o appendiceal problem  Hernia  Get ct scan  And plan  Fu     . Thrombocytopenia ([place])  Past Surgical History:  Procedure Laterality Date  . CARDIOVERSION N/A 02/26/2014   Procedure: CARDIOVERSION AT BEDSIDE;  Surgeon: Pixie Casino, MD;  Location: [place];  Service: Cardiovascular;  Laterality: N/A;  . CARDIOVERSION N/A 04/22/2014   Procedure: CARDIOVERSION (BEDSIDE);  Surgeon: [name], MD;  Location: [facility] OR;  Service: Cardiovascular;  Laterality: N/A;  . CATARACT EXTRACTION     Bilateral  implantt  . CESAREAN SECTION     times 2  . CHOLECYSTECTOMY  1999  . ESOPHAGOGASTRODUODENOSCOPY N/A 07/06/2014   Procedure: ESOPHAGOGASTRODUODENOSCOPY (EGD);  Surgeon: Ladene Artist, MD;  Location[facility] ENDOSCOPY;  Service: Endoscopy;  Laterality: N/A;  . INSERT / REPLACE / REMOVE PACEMAKER    . LEFT HEART CATHETERIZATION WITH CORONARY ANGIOGRAM N/A 01/29/2014   Procedure: LEFT HEART CATHETERIZATION WITH CORONARY ANGIOGRAM;  Surgeon: Blane Ohara, MD;  Location: [facility] CATH LAB;  Service: Cardiovascular;  Laterality: N/A;  . PACEMAKER INSERTION  04/23/2014   STJ Assurity dual chamber pacemaker implanted by Dr Rayann Heman  . PERMANENT PACEMAKER INSERTION N/A 04/23/2014   Procedure: PERMANENT PACEMAKER INSERTION;  Surgeon: Evans Lance, MD;  Location: [facility] CATH LAB;  Service: Cardiovascular;  Laterality: N/A;  . RIGHT HEART CATHETERIZATION N/A 09/22/2014   Procedure: RIGHT HEART CATH;  Surgeon: Jolaine Artist, MD;  Location: [facility] CATH LAB;  Service: Cardiovascular;  Laterality: N/A;  . SHOULDER SURGERY  1996   Right      Home Medications:  Prior to Admission medications   Medication Sig Start Date End Date Taking? Authorizing Provider  amiodarone (PACERONE) 200 MG tablet Take 1 tablet (200 mg total) by mouth daily. 11/25/16  Yes Bensimhon, Shaune Pascal, MD  citalopram (CELEXA) 20 MG tablet Take 20 mg by mouth daily.   Yes [provider]  ELIQUIS 2.5 MG TABS tablet TAKE ONE TABLET BY MOUTH TWICE DAILY Patient taking differently: Take 2.5 mg by mouth two times a day 05/22/17  Yes Bensimhon, Shaune Pascal, MD  Ferrous Sulfate (IRON) 325 (65 Fe) MG TABS Take 1 tablet by mouth daily. 07/01/16  Yes Johnson, Clanford L, MD  lactose free nutrition (BOOST) LIQD Take 237 mLs by mouth 2 (two) times daily between meals.   Yes [provider]  levothyroxine (SYNTHROID, LEVOTHROID) 75 MCG tablet TAKE 1 TABLET BY MOUTH ONCE DAILY 05/22/17  Yes Panosh, Standley Brooking, MD  loratadine (CLARITIN) 10 MG tablet Take 1 tablet (10 mg total) by mouth daily. 03/10/17  Yes Jani Gravel, MD  LORazepam (ATIVAN) 0.5 MG tablet Take 0.5 mg by mouth every morning.    Yes [provider]  pantoprazole  (PROTONIX) 40 MG tablet Take 1 tablet (40 mg total) by mouth daily. 08/19/17  Yes Duffy Bruce, MD  polyethylene glycol [facility]) packet Take 17 g by mouth daily. Mix with 8 ounces of liquid. Patient taking differently: Take 17 g by mouth every Monday, Wednesday, and Friday. Mix with 8 ounces of liquid. 08/13/17  Yes Malvin Johns, MD  potassium chloride SA (K-DUR,KLOR-CON) 20 MEQ tablet Take 1 tablet (20 mEq total) by mouth daily. Take an additional 5meq with Metolazone. Patient taking differently: Take 20 mEq by mouth every other day.  06/09/17  Yes Pattricia Boss, MD  simvastatin (ZOCOR) 10 MG tablet TAKE ONE TABLET BY MOUTH AT BEDTIME 02/14/17  Yes Larey Dresser, MD  torsemide (DEMADEX) 20 MG tablet Take 1 tablet (20 mg total) by mouth daily. Please start on 11/22/2016 Patient taking differently: Take 20 mg by mouth every  other day.  06/09/17  Yes Pattricia Boss, MD  acetaminophen (TYLENOL) 325 MG tablet Take 650 mg by mouth every 4 (four) hours as needed (for pain).     [provider]  bisacodyl (DULCOLAX) 10 MG suppository Place 10 mg rectally as needed for moderate constipation.    [provider]  docusate sodium (COLACE) 50 MG capsule Take 50 mg by mouth daily.    [provider]  magnesium hydroxide (MILK OF MAGNESIA) 400 MG/5ML suspension Take 30 mLs by mouth daily as needed for mild constipation.    [provider]  meclizine (ANTIVERT) 12.5 MG tablet Take 1 tablet (12.5 mg total) by mouth 2 (two) times daily as needed for dizziness. 09/14/16   Bensimhon, Shaune Pascal, MD  metolazone (ZAROXOLYN) 2.5 MG tablet Take 1 tablet (2.5 mg total) by mouth as needed (for weight 134 lb or greater). Patient taking differently: Take 2.5 mg by mouth daily as needed (if weight is 134 pounds or greater).  03/09/16   Bensimhon, Shaune Pascal, MD  nitroGLYCERIN (NITROSTAT) 0.4 MG SL tablet Place 1 tablet (0.4 mg total) under the tongue every 5 (five) minutes as needed. For chest  pain Patient not taking: Reported on 10/14/2017 05/06/16   Bensimhon, Shaune Pascal, MD    Inpatient Medications: Scheduled Meds: . amiodarone  200 mg Oral Daily  . apixaban  2.5 mg Oral BID  . citalopram  20 mg Oral Daily  . ferrous sulfate  325 mg Oral Daily  . furosemide  40 mg Intravenous Daily  . lactose free nutrition  237 mL Oral BID BM  . levothyroxine  75 mcg Oral QAC breakfast  . loratadine  10 mg Oral Daily  . LORazepam  0.5 mg Oral q morning - 10a  . pantoprazole  40 mg Oral Daily  . potassium chloride SA  20 mEq Oral QODAY  . simvastatin  10 mg Oral QHS  . sodium chloride flush  3 mL Intravenous Q12H   Continuous Infusions: . sodium chloride     PRN Meds: sodium chloride, acetaminophen, guaiFENesin-dextromethorphan, magnesium hydroxide, nitroGLYCERIN, ondansetron (ZOFRAN) IV, sodium chloride flush  Allergies:    Allergies  Allergen Reactions  . Other Shortness Of Breath    Detergents, perfumes and other strong, odor-emitting compounds  . Tramadol Shortness Of Breath and Nausea Only  . Ibuprofen Other (See Comments)    Listed as an allergy on MAR    Social History:   Social History   Socioeconomic History  . Marital status: Widowed    Spouse name: Not on file  . Number of children: 3  . Years of education: Not on file  . Highest education level: Not on file  Social Needs  . Financial resource strain: Not on file  . Food insecurity - worry: Not on file  . Food insecurity - inability: Not on file  . Transportation needs - medical: Not on file  . Transportation needs - non-medical: Not on file  Occupational History    Employer: RETIRED    Comment: [employer], book keeping  Tobacco Use  . Smoking status: Never Smoker  . Smokeless tobacco: Never Used  Substance and Sexual Activity  . Alcohol use: No  . Drug use: No  . Sexual activity: No  Other Topics Concern  . 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 Eats with her.      Daughter Joseph Art handles medications. Sees HH and PT once a week.   Helper  Malin  there Friday 1-3 pm   Pt lives alone, Joseph Art lives 30 min away.  05-15-17    Family History:    Family History  Problem Relation Age of Onset  . Coronary artery disease Father   . Heart disease Father   . Heart attack Father   . Alzheimer's disease Mother   . Lung cancer Brother 63  . Lung cancer Unknown      ROS:  Please see the history of present illness.  Review of Systems  All other systems reviewed and are negative.   All other ROS reviewed and negative.     Physical Exam/Data:   Vitals:   10/14/17 1715 10/14/17 2023 10/15/17 0016 10/15/17 0237  BP: (!) 110/59 (!) 113/50 (!) 104/52   Pulse: 66 64 63   Resp: 16     Temp: 98 F (36.7 C) 97.6 F (36.4 C) 98.1 F (36.7 C)   TempSrc: Oral Oral Oral   SpO2: 98% 96% 97%   Weight: 123 lb 0.3 oz (55.8 kg)   121 lb 1.6 oz (54.9 kg)  Height: 4\' 10"  (1.473 m)       Intake/Output Summary (Last 24 hours) at 10/15/2017 1040 Last data filed at 10/14/2017 2300 Gross per 24 hour  Intake 480 ml  Output 1350 ml  Net -870 ml   Filed Weights   10/14/17 1715 10/15/17 0237  Weight: 123 lb 0.3 oz (55.8 kg) 121 lb 1.6 oz (54.9 kg)   Body mass index is 25.31 kg/m.  General:  Well nourished, well developed, in no acute distress HEENT: normal Lymph: no adenopathy Neck: no JVD Endocrine:  No thryomegaly Vascular: No carotid bruits Cardiac:  normal S1, S2; RRR; no murmur Lungs:  clear to auscultation bilaterally, no wheezing, rhonchi or rales  Abd: soft, nontender, no hepatomegaly  Ext: no edema Musculoskeletal:  No deformities, BUE and BLE strength normal and equal Skin: warm and dry  Neuro:  CNs 2-12 intact, no focal abnormalities noted Psych:   Normal affect   EKG:  The EKG was personally reviewed and demonstrates:  V-paced rhythm Telemetry:  Telemetry was personally reviewed and demonstrates:  V-paced rhythm  Relevant CV Studies:  Echo 07/26/17: Study Conclusions - Left ventricle: The cavity size was normal. Wall thickness was   increased in a pattern of mild LVH. Systolic function was normal.   The estimated ejection fraction was in the range of 60% to 65%.   Wall motion was normal; there were no regional wall motion   abnormalities. Doppler parameters are consistent with high   ventricular filling pressure. - Aortic valve: Trileaflet; moderately thickened, moderately   calcified leaflets. Valve mobility was restricted. There was mild   regurgitation. - Mitral valve: Calcified annulus. Moderately thickened leaflets .   There was moderate regurgitation directed posteriorly. - Left atrium: The atrium was severely dilated. Volume/bsa, ES,   (1-plane Simpson&'s, A2C): 81.8 ml/m^2. - Right ventricle: Pacer wire or catheter noted in right ventricle. - Right atrium: The atrium was severely dilated. - Tricuspid valve: There was severe regurgitation. - Pulmonary arteries: Systolic pressure was moderately increased.   PA peak pressure: 60 mm Hg (S).  Echocardiogram 05/28/17: Study Conclusions - Left ventricle: The cavity size was normal. Wall thickness was increased in a pattern of mild LVH. Systolic function was mildly reduced. The estimated ejection fraction was in the range of 45% to 50%. There is akinesis of the mid-apicalinferoseptal myocardium. Doppler parameters are consistent with a reversible restrictive pattern, indicative of decreased left ventricular diastolic compliance and/or increased left atrial pressure (grade 3 diastolic dysfunction). - Aortic valve: Valve mobility was restricted. There was mild regurgitation. - Mitral valve: Calcified annulus. Mildly thickened leaflets . There was mild  regurgitation. - Left atrium: The atrium was moderately dilated. - Right ventricle: Pacer wire or catheter noted in right ventricle. - Pulmonary arteries: Systolic pressure was mildly increased. PA peak pressure: 35 mm Hg (S).  Impressions: - Mitral regurgitation does not appear as severe as previous echocardiogram demonstrates (personally viewed).   Echocardiogram 03/10/17: Study Conclusions - Left ventricle: The cavity size was normal. Wall thickness was normal. Systolic function was normal. The estimated ejection fraction was in the range of 55% to 60%. Doppler parameters are consistent with a reversible restrictive pattern, indicative of decreased left ventricular diastolic compliance and/or increased left atrial pressure (grade 3 diastolic dysfunction). - Aortic valve: Mildly calcified annulus. Moderately thickened, moderately calcified leaflets. There was mild regurgitation. Valve area (Vmax): 1.29 cm^2. - Mitral valve: There was severe regurgitation. - Left atrium: The atrium was massively dilated. - Right atrium: The atrium was moderately dilated. - Tricuspid valve: There was severe regurgitation. - Pulmonary arteries: Systolic pressure was mildly increased. PA peak pressure: 37 mm Hg (S).  NM stress 03/31/15: IMPRESSION: 1. No scintigraphic evidence of prior infarction or pharmacologically induced ischemia. 2. Normal left ventricular wall motion. 3. Left ventricular ejection fraction 73% 4. Low-risk stress test findings*.   Right heart cath 09/22/14: Findings: RA = 11 RV = 49/6/10 PA = 49/22 (34) PCW = 18 (v = 25) Fick cardiac output/index = 6.4/4.0 PVR = 2.5 WU FA sat = 95% PA sat = 72%,74%  Plan/Discussion: Her PCWP looks pretty good but has significant v-waves in wedge tracing as well as elevated R-sided pressures and significant peripheral edema. Will admit for brief stay and attempt IV diuresis as tolerated. Follow  CVPs.   Left heart cath 01/29/14: Final Conclusions:  1. Diffuse nonobstructive coronary artery disease 2. Normal left ventricular systolic function  Recommendations: Medical therapy for nonobstructive CAD.    Laboratory Data:  Chemistry Recent Labs  Lab 10/14/17 0930 10/15/17 0338  NA 137 140  K 4.5 4.2  CL 106 107  CO2 23 26  GLUCOSE 106* 81  BUN 33* 31*  CREATININE 1.73* 1.73*  CALCIUM 9.5 9.4  GFRNONAA 25* 25*  GFRAA 29* 29*  ANIONGAP 8 7    Recent Labs  Lab 10/14/17 0930  PROT 6.1*  ALBUMIN 3.0*  AST 31  ALT 20  ALKPHOS 88  BILITOT 0.8   Hematology Recent Labs  Lab 10/14/17 0930  WBC 5.0  RBC 3.85*  HGB 12.2  HCT 38.2  MCV 99.2  MCH 31.7  MCHC 31.9  RDW 14.5  PLT 122*   Cardiac EnzymesNo results for input(s): TROPONINI in the last 168 hours.  Recent Labs  Lab 10/14/17 1032 10/14/17 1448  TROPIPOC 0.07 0.06    BNP Recent Labs  Lab 10/14/17 0930  BNP 553.2*    DDimer No results for input(s): DDIMER in the last 168 hours.  Radiology/Studies:  Dg Chest 2 View  Result Date: 10/14/2017 CLINICAL DATA:  Chest pain,  shortness of Breath EXAM: CHEST  2 VIEW COMPARISON:  09/02/2017 FINDINGS: Right pacer remains in place, unchanged. Cardiomegaly with vascular congestion. Interstitial prominence likely reflects early interstitial edema. No effusions. No acute bony abnormality. IMPRESSION: Cardiomegaly.  Suspect mild interstitial edema/CHF. Electronically Signed   By: Rolm Baptise M.D.   On: 10/14/2017 09:58    Assessment and Plan:   1. Chest pain - troponin POC 0.07 --> 0.06 -- negative values under 0.08 - EKG without acute ischemic changes  This patient has had multiple evaluations for chest pain that has been largely atypical in nature. Cath in 2015 and NM stress test in 2016 without obstructive disease or reversible ischemia. The patient has CKD stage IV. Given that her chest pain continues to be atypical this admission, would recommend  against heart catheterization. Given her relatively recent studies, I do not think she would benefit from a repeat stress test.  I discussed this with Ms Speas and she agrees. She does not want any testing and would like to discharge back to her SNF. She generally denies chest pain and states she felt congested a couple of days ago and had an upset stomach that is now resolved.  No medication changes.     For questions or updates, please contact [place] Please consult [email or web address] for contact info under Cardiology/STEMI.   Signed, Ledora Bottcher, PA  10/15/2017 10:40 AM  Personally seen and examined. Agree with above.  83 year old female with multiple prior evaluations for chest pain including catheterization in 2015 showing nonobstructive CAD and nuclear stress test in 2016 without any reversible ischemia.  Stage IV chronic kidney disease noted.  She denied any chest discomfort currently.  By review of history, the pains continues to be quite atypical.  She does not wish to have any further testing.  I agree with Fabian Sharp, PA and that further stress testing would not likely change any management at this point.  Perhaps she had a gastroenteritis, she states that her stomach was upset for the past few days.  At this time, let us continue with our conservative management.  Of course given her advanced age and minimally elevated flat troponin, she is at increased risk for cardiovascular morbidity and mortality.  Flat elevated low level troponin is consistent with demand ischemia.  Exam reveals a pleasant elderly female, regular rate and rhythm, soft abdomen, lungs are clear.  EKG with underlying possible atrial fibrillation with occasional ventricular pacing spikes.  Note, she has had prior cardioversion which led to severe bradycardia which then led to pacemaker implantation.  Overall, I think we should grant her wish and allow her to go back to her facility.  Candee Furbish,  MD

## 2017-10-15 NOTE — Progress Notes (Addendum)
Pt stable throughout morning period, denies chest pain and SOB, getting up and sitting in a recliner, walking to the bathroom, UA and Urine culture been sent, will continue to monitor Curent troponin is 0.05, but trending down, pt denies any CP and vitals stable, MD aware Pt is confused at times and forgetful too, she is asking going home a lot.will continue to monitor  [name], RN

## 2017-10-15 NOTE — Progress Notes (Addendum)
PROGRESS NOTE  [name] VPX:[id number] DOB: 05/22/1925 DOA: 10/14/2017 PCP: Burnis Medin, MD  HPI/Recap of past 24 hours: [name] is a 83 y.o. female with medical history significant for congestive heart failure with preserved ejection fraction , history of atrial fibrillation on Eliquis, hypertension, hyperlipidemia, GERD, presenting with chest pain waking her up around 5 in the morning, central, which she described as pressure, tightness occasionally radiating to the back.   Pt seen and examined at her bedside this morning she is alert but confused and does not answer questions appropriately. Unable to obtain ROS. Getting U/a and urine culture. Cardiology following for initial complaint of chest pain. 2 sets troponin negative.   Assessment/Plan: Active Problems:   Hyperlipidemia   HYPERTENSION, BENIGN   Hypothyroidism   MGUS (monoclonal gammopathy of unknown significance)   Atrial fibrillation (HCC)   Chronic diastolic CHF (congestive heart failure) (HCC)   Chronic anticoagulation   Iron deficiency anemia   CKD (chronic kidney disease) stage 4, GFR 15-29 ml/min (HCC)   Anemia of chronic disease   CHF exacerbation (HCC)   PAF (paroxysmal atrial fibrillation) (HCC)   CAD (coronary artery disease)  AMS -unclear if baseline dementia -fall precaution -reorient as needed -U/A urine cx ordered -cxr mild increase in pulm vascularity  Chest pain r/o ACS -pt unable to quantify/provide hx regarding her chest pain -cardiology following -troponin negative x 2 -1 set troponin ordered -nitro, amiodarone, zocor  Non obstructive CAD -zocor -cardiology following. We appreciate recommendations  CKD 4 -stable -1.8 GFR 27 (1 month ago) -cr 1.73 -avoid nephrotoxic meds -avoid hypotension and dehydration -Bmp am  GERD -protonix po  Chronic thrombocytopenia -stable -plt 122 from 110 -no sign of bleeding  Hypothyroidism -levothyroxine   Code Status:  DNR  Family Communication: No family members at bedside.  Disposition Plan: Will stay another midnight to continue current management.   Consultants:  Cardiology  Procedures:  None  Antimicrobials:  None  DVT prophylaxis:  eliquis for a-fib   Objective: Vitals:   10/14/17 2023 10/15/17 0016 10/15/17 0237 10/15/17 1200  BP: (!) 113/50 (!) 104/52  (!) 121/54  Pulse: 64 63  74  Resp:    18  Temp: 97.6 F (36.4 C) 98.1 F (36.7 C)  (!) 97.5 F (36.4 C)  TempSrc: Oral Oral  Oral  SpO2: 96% 97%  100%  Weight:   54.9 kg (121 lb 1.6 oz)   Height:        Intake/Output Summary (Last 24 hours) at 10/15/2017 1506 Last data filed at 10/15/2017 0900 Gross per 24 hour  Intake 540 ml  Output 450 ml  Net 90 ml   Filed Weights   10/14/17 1715 10/15/17 0237  Weight: 55.8 kg (123 lb 0.3 oz) 54.9 kg (121 lb 1.6 oz)    Exam:   General:  83 yo CF thin built, alert but confused. Does not answer questions appropriately.  Cardiovascular: IRRR no rubs or gallops  Respiratory: CTA no wheezes or rales  Abdomen: soft NT ND NBS x4  Musculoskeletal: Moves limbs. No LE edema  Skin: No noted rashes  Psychiatry: unable to assess due to altered mental status with unclear baseline   Data Reviewed: CBC: Recent Labs  Lab 10/14/17 0930  WBC 5.0  HGB 12.2  HCT 38.2  MCV 99.2  PLT 462*   Basic Metabolic Panel: Recent Labs  Lab 10/14/17 0930 10/15/17 0338  NA 137 140  K 4.5 4.2  CL 106 107  CO2 23 26  GLUCOSE 106* 81  BUN 33* 31*  CREATININE 1.73* 1.73*  CALCIUM 9.5 9.4   GFR: Estimated Creatinine Clearance: 15.9 mL/min (A) (by C-G formula based on SCr of 1.73 mg/dL (H)). Liver Function Tests: Recent Labs  Lab 10/14/17 0930  AST 31  ALT 20  ALKPHOS 88  BILITOT 0.8  PROT 6.1*  ALBUMIN 3.0*   Recent Labs  Lab 10/14/17 0930  LIPASE 29   No results for input(s): AMMONIA in the last 168 hours. Coagulation Profile: No results for input(s): INR, PROTIME  in the last 168 hours. Cardiac Enzymes: No results for input(s): CKTOTAL, CKMB, CKMBINDEX, TROPONINI in the last 168 hours. BNP (last 3 results) No results for input(s): PROBNP in the last 8760 hours. HbA1C: No results for input(s): HGBA1C in the last 72 hours. CBG: No results for input(s): GLUCAP in the last 168 hours. Lipid Profile: No results for input(s): CHOL, HDL, LDLCALC, TRIG, CHOLHDL, LDLDIRECT in the last 72 hours. Thyroid Function Tests: No results for input(s): TSH, T4TOTAL, FREET4, T3FREE, THYROIDAB in the last 72 hours. Anemia Panel: No results for input(s): VITAMINB12, FOLATE, FERRITIN, TIBC, IRON, RETICCTPCT in the last 72 hours. Urine analysis:    Component Value Date/Time   COLORURINE STRAW (A) 08/13/2017 1248   APPEARANCEUR CLEAR 08/13/2017 1248   LABSPEC 1.006 08/13/2017 1248   PHURINE 5.0 08/13/2017 1248   GLUCOSEU NEGATIVE 08/13/2017 1248   GLUCOSEU NEGATIVE 12/19/2011 0956   HGBUR NEGATIVE 08/13/2017 1248   BILIRUBINUR NEGATIVE 08/13/2017 1248   BILIRUBINUR neg 02/27/2017 1618   KETONESUR NEGATIVE 08/13/2017 1248   PROTEINUR NEGATIVE 08/13/2017 1248   UROBILINOGEN 0.2 02/27/2017 1618   UROBILINOGEN 0.2 09/10/2015 1150   NITRITE NEGATIVE 08/13/2017 1248   LEUKOCYTESUR NEGATIVE 08/13/2017 1248   Sepsis Labs: @LABRCNTIP (procalcitonin:4,lacticidven:4)  ) Recent Results (from the past 240 hour(s))  MRSA PCR Screening     Status: None   Collection Time: 10/14/17  6:16 PM  Result Value Ref Range Status   MRSA by PCR NEGATIVE NEGATIVE Final    Comment:        The GeneXpert MRSA Assay (FDA approved for NASAL specimens only), is one component of a comprehensive MRSA colonization surveillance program. It is not intended to diagnose MRSA infection nor to guide or monitor treatment for MRSA infections.       Studies: No results found.  Scheduled Meds: . amiodarone  200 mg Oral Daily  . apixaban  2.5 mg Oral BID  . citalopram  20 mg Oral Daily    . ferrous sulfate  325 mg Oral Daily  . furosemide  40 mg Intravenous Daily  . lactose free nutrition  237 mL Oral BID BM  . levothyroxine  75 mcg Oral QAC breakfast  . loratadine  10 mg Oral Daily  . LORazepam  0.5 mg Oral q morning - 10a  . pantoprazole  40 mg Oral Daily  . potassium chloride SA  20 mEq Oral QODAY  . simvastatin  10 mg Oral QHS  . sodium chloride flush  3 mL Intravenous Q12H    Continuous Infusions: . sodium chloride       LOS: 0 days     Kayleen Memos, MD [facility] Pager [phone]  If 7PM-7AM, please contact night-coverage [email or web address] Password [email or web address] 10/15/2017, 3:06 PM

## 2017-10-15 NOTE — Discharge Instructions (Signed)

## 2017-10-15 NOTE — Plan of Care (Signed)
  Progressing Elimination: Will not experience complications related to bowel motility 10/15/2017 0016 - Progressing by [place] Pugh, RN Will not experience complications related to urinary retention 10/15/2017 0016 - Progressing by [place] Pugh, RN Pain Managment: General experience of comfort will improve 10/15/2017 0016 - Progressing by West Pugh, RN Safety: Ability to remain free from injury will improve 10/15/2017 0016 - Progressing by West Pugh, RN Skin Integrity: Risk for impaired skin integrity will decrease 10/15/2017 0016 - Progressing by West Pugh, RN

## 2017-10-16 DIAGNOSIS — I2510 Atherosclerotic heart disease of native coronary artery without angina pectoris: Secondary | ICD-10-CM

## 2017-10-16 DIAGNOSIS — R0789 Other chest pain: Secondary | ICD-10-CM

## 2017-10-16 LAB — CBC
HEMATOCRIT: 42.3 % (ref 36.0–46.0)
HEMOGLOBIN: 13.6 g/dL (ref 12.0–15.0)
MCH: 31.6 pg (ref 26.0–34.0)
MCHC: 32.2 g/dL (ref 30.0–36.0)
MCV: 98.1 fL (ref 78.0–100.0)
Platelets: 130 10*3/uL — ABNORMAL LOW (ref 150–400)
RBC: 4.31 MIL/uL (ref 3.87–5.11)
RDW: 14.3 % (ref 11.5–15.5)
WBC: 5 10*3/uL (ref 4.0–10.5)

## 2017-10-16 LAB — BASIC METABOLIC PANEL
ANION GAP: 7 (ref 5–15)
BUN: 31 mg/dL — ABNORMAL HIGH (ref 6–20)
CO2: 25 mmol/L (ref 22–32)
Calcium: 9.8 mg/dL (ref 8.9–10.3)
Chloride: 108 mmol/L (ref 101–111)
Creatinine, Ser: 1.71 mg/dL — ABNORMAL HIGH (ref 0.44–1.00)
GFR, EST AFRICAN AMERICAN: 29 mL/min — AB (ref >60)
GFR, EST NON AFRICAN AMERICAN: 25 mL/min — AB (ref >60)
Glucose, Bld: 109 mg/dL — ABNORMAL HIGH (ref 65–99)
POTASSIUM: 4 mmol/L (ref 3.5–5.1)
SODIUM: 140 mmol/L (ref 135–145)

## 2017-10-16 NOTE — Care Management Note (Addendum)
Case Management Note  Patient Details  Name: IRIDIAN READER MRN: [id number] Date of Birth: 09/21/1925  Subjective/Objective:      Chest Pain            Action/Plan: Patient is from [facility]; SW to follow for transitioning back to facility;  Expected Discharge Date:    possibility 10/20/2017              Expected Discharge Plan:  [place]  Discharge planning Services  CM Consult  Status of Service:  In process, will continue to follow  Sherrilyn Rist [phone] 10/16/2017, 11:32 AM

## 2017-10-16 NOTE — Plan of Care (Signed)
  Health Behavior/Discharge Planning: Ability to manage health-related needs will improve 10/16/2017 0406 - Progressing by Rolm Baptise, RN   Nutrition: Adequate nutrition will be maintained 10/16/2017 0406 - Progressing by Rolm Baptise, RN   Coping: Level of anxiety will decrease 10/16/2017 0406 - Progressing by Rolm Baptise, RN   Safety: Ability to remain free from injury will improve 10/16/2017 0406 - Progressing by Rolm Baptise, RN

## 2017-10-16 NOTE — Clinical Social Work Note (Signed)
Clinical Social Work Assessment  Patient Details  Name: [name] MRN: [id number] Date of Birth: 06/11/1925  Date of referral:  10/16/17               Reason for consult:  Discharge Planning                Permission sought to share information with:  Chartered certified accountant granted to share information::  Yes, Verbal Permission Granted  Name::        Agency::  [facility]  Relationship::     Contact Information:     Housing/Transportation Living arrangements for the past 2 months:  [place] of Information:  Patient, Medical Team Patient Interpreter Needed:  None Criminal Activity/Legal Involvement Pertinent to Current Situation/Hospitalization:  No - Comment as needed Significant Relationships:  Adult Children Lives with:  Facility Resident Do you feel safe going back to the place where you live?  Yes Need for family participation in patient care:  Yes (Comment)  Care giving concerns: Patient is from [place].   Social Worker assessment / plan:  CSW met with patient. No supports at bedside. CSW introduced role and explained that discharge planning would be discussed. Patient confirmed she is from [facility] and plans to return once stable for discharge. Patient stated she was supposed to discharge yesterday. Family will transport her back to the facility. No further concerns. CSW encouraged patient to contact CSW as needed. CSW will continue to follow patient for support and facilitate discharge to ALF once medically stable.  Employment status:  Retired Nurse, adult PT Recommendations:  Not assessed at this time Information / Referral to community resources:  Other (Comment Required)(Plan is to return to ALF)  Patient/Family's Response to care:  Patient agreeable to return to ALF. Patient's daughter supportive and involved in patient care. Patient appreciated social work  intervention.  Patient/Family's Understanding of and Emotional Response to Diagnosis, Current Treatment, and Prognosis:  Patient has a good understanding of reason for admission and plan to return to ALF once stable for discharge. Patient appears happy with hospital care.  Emotional Assessment Appearance:  Appears stated age Attitude/Demeanor/Rapport:  Other(Pleasant) Affect (typically observed):  Accepting, Appropriate, Calm, Pleasant Orientation:  Oriented to Self, Oriented to Place, Oriented to  Time, Oriented to Situation Alcohol / Substance use:  Never Used Psych involvement (Current and /or in the community):  No (Comment)  Discharge Needs  Concerns to be addressed:  Care Coordination Readmission within the last 30 days:  Yes Current discharge risk:  None Barriers to Discharge:  Continued Medical Work up   Candie Chroman, LCSW 10/16/2017, 1:03 PM

## 2017-10-16 NOTE — Clinical Social Work Note (Signed)
CSW spoke with [place] at [facility]. She will review discharge summary and call CSW back when it is okay to send patient back to the facility. They do not need an FL2 since patient was only here two days. Patient told RN that she will need ambulance transport and gave permission for CSW to call her daughter to let her know.  Dayton Scrape, [place]

## 2017-10-16 NOTE — Progress Notes (Signed)
Reviewed discharge instructions with patient. Answered her questions. Pt will be going to [place]. Waiting for transportation to be set up for pt.  Called report to [place] and talked to [place].

## 2017-10-16 NOTE — NC FL2 (Signed)
[place] MEDICAID FL2 LEVEL OF CARE SCREENING TOOL     IDENTIFICATION  Patient Name: [name] Birthdate: Mar 03, 1925 Sex: female Admission Date (Current Location): 10/14/2017  [facility] and [place] Number:  Herbalist and Address:  [facility] [place], [place] [address], [place], [place] [address]      Provider Number: [id number]  Attending Physician Name and Address:  Kayleen Memos, DO  Relative Name and Phone Number:       Current Level of Care: Hospital Recommended Level of Care: [place] Prior Approval Number:    Date Approved/Denied:   PASRR Number:    Discharge Plan: Other (Comment)(ALF)    Current Diagnoses: Patient Active Problem List   Diagnosis Date Noted  . Chest pain of uncertain etiology 95/07/3266  . CAD (coronary artery disease) 07/24/2017  . CKD (chronic kidney disease), stage IV ([place]) 07/24/2017  . Dementia 07/24/2017  . Atrial fibrillation, chronic ([place]) 07/24/2017  . Monoclonal gammopathy of unknown significance (MGUS) 07/24/2017  . Thrombocytopenia, idiopathic ([place]) 07/24/2017  . Pulmonary HTN ([place]) 07/24/2017  . Mild mitral regurgitation 07/24/2017  . Idiopathic hypotension   . Fall 05/27/2017  . Mitral regurgitation 03/10/2017  . Mitral stenosis 03/10/2017  . Chest pain 03/08/2017  . Dehydration 11/18/2016  . Intractable nausea and vomiting 11/18/2016  . Generalized weakness 11/18/2016  . Hypokalemia 11/11/2016  . Hypertensive heart disease 04/01/2016  . PAF (paroxysmal atrial fibrillation) ([place]) 03/31/2016  . Chest pain with moderate risk for cardiac etiology, negative MI and Nuc study, + muscular skeletal   . Atypical chest pain 01/21/2016  . CHF exacerbation ([place]) 07/30/2015  . CKD (chronic kidney disease) stage 4, GFR 15-29 ml/min (HCC) 05/09/2015  . Anemia of chronic disease 05/09/2015  . Dyslipidemia 05/09/2015  . Elevated troponin   . Iron deficiency anemia 11/21/2014  . Pacemaker  04/27/2014  . Chronic anticoagulation 02/25/2014  . Chronic diastolic CHF (congestive heart failure) ([place]) 02/24/2014  . Atrial fibrillation ([place]) 01/29/2014  . MGUS (monoclonal gammopathy of unknown significance) 08/01/2013  . Thrombocytopenia ([place])   . Hypothyroidism 02/19/2012  . Hyperlipidemia 12/15/2008  . HYPERTENSION, BENIGN 12/15/2008    Orientation RESPIRATION BLADDER Height & Weight     Self, Time, Situation, Place  Normal Continent Weight: 119 lb 4.8 oz (54.1 kg)(c scale) Height:  4\' 10"  (147.3 cm)  BEHAVIORAL SYMPTOMS/MOOD NEUROLOGICAL BOWEL NUTRITION STATUS  (None) (None) Continent Diet(According to discharge summary, "resume previous diet.")  AMBULATORY STATUS COMMUNICATION OF NEEDS Skin     Verbally Bruising                       Personal Care Assistance Level of Assistance              Functional Limitations Info  Sight, Hearing, Speech Sight Info: Adequate Hearing Info: Adequate Speech Info: Adequate    SPECIAL CARE FACTORS FREQUENCY  Blood pressure                    Contractures Contractures Info: Not present    Additional Factors Info  Code Status, Allergies Code Status Info: DNR Allergies Info: Other, Tramadol, Ibuprofen.           Current Medications (10/16/2017):  This is the current hospital active medication list Current Facility-Administered Medications  Medication Dose Route Frequency Provider Last Rate Last Dose  . 0.9 %  sodium chloride infusion  250 mL Intravenous PRN Lady Deutscher, MD      .  acetaminophen (TYLENOL) tablet 650 mg  650 mg Oral Q4H PRN Lady Deutscher, MD   650 mg at 10/14/17 2135  . amiodarone (PACERONE) tablet 200 mg  200 mg Oral Daily Lady Deutscher, MD   200 mg at 10/16/17 0815  . apixaban (ELIQUIS) tablet 2.5 mg  2.5 mg Oral BID Lady Deutscher, MD   2.5 mg at 10/16/17 0815  . citalopram (CELEXA) tablet 20 mg  20 mg Oral Daily Lady Deutscher, MD   20 mg at 10/16/17 0815  . ferrous  sulfate tablet 325 mg  325 mg Oral Daily Lady Deutscher, MD   325 mg at 10/16/17 0815  . furosemide (LASIX) injection 40 mg  40 mg Intravenous Daily Lady Deutscher, MD   40 mg at 10/16/17 0816  . guaiFENesin-dextromethorphan (ROBITUSSIN DM) 100-10 MG/5ML syrup 5 mL  5 mL Oral Q6H PRN Lady Deutscher, MD   5 mL at 10/14/17 2136  . lactose free nutrition (Boost) liquid 237 mL  237 mL Oral BID BM Lady Deutscher, MD   237 mL at 10/16/17 1314  . levothyroxine (SYNTHROID, LEVOTHROID) tablet 75 mcg  75 mcg Oral QAC breakfast Lady Deutscher, MD   75 mcg at 10/16/17 3664  . loratadine (CLARITIN) tablet 10 mg  10 mg Oral Daily Lady Deutscher, MD   10 mg at 10/16/17 0815  . LORazepam (ATIVAN) tablet 0.5 mg  0.5 mg Oral q morning - 10a Lady Deutscher, MD   0.5 mg at 10/16/17 0815  . magnesium hydroxide (MILK OF MAGNESIA) suspension 30 mL  30 mL Oral Daily PRN Lady Deutscher, MD      . nitroGLYCERIN (NITROSTAT) SL tablet 0.4 mg  0.4 mg Sublingual Q5 min PRN Lady Deutscher, MD      . ondansetron [facility]) injection 4 mg  4 mg Intravenous Q6H PRN Lady Deutscher, MD      . pantoprazole (PROTONIX) EC tablet 40 mg  40 mg Oral Daily Lady Deutscher, MD   40 mg at 10/16/17 4034  . potassium chloride SA (K-DUR,KLOR-CON) CR tablet 20 mEq  20 mEq Oral Cathren Laine, MD   20 mEq at 10/16/17 0815  . simvastatin (ZOCOR) tablet 10 mg  10 mg Oral QHS Lady Deutscher, MD   10 mg at 10/15/17 2303  . sodium chloride flush (NS) 0.9 % injection 3 mL  3 mL Intravenous Q12H Lady Deutscher, MD   3 mL at 10/16/17 0816  . sodium chloride flush (NS) 0.9 % injection 3 mL  3 mL Intravenous PRN Lady Deutscher, MD         Discharge Medications: TAKE these medications   acetaminophen 325 MG tablet Commonly known as:  TYLENOL Take 650 mg by mouth every 4 (four) hours as needed (for pain).   amiodarone 200 MG tablet Commonly known as:  PACERONE Take 1 tablet (200 mg total)  by mouth daily.   bisacodyl 10 MG suppository Commonly known as:  DULCOLAX Place 10 mg rectally as needed for moderate constipation.   citalopram 20 MG tablet Commonly known as:  CELEXA Take 20 mg by mouth daily.   docusate sodium 50 MG capsule Commonly known as:  COLACE Take 50 mg by mouth daily.   ELIQUIS 2.5 MG Tabs tablet Generic drug:  apixaban TAKE ONE TABLET BY MOUTH TWICE DAILY What changed:    how much to take  how to take this  when  to take this   Iron 325 (65 Fe) MG Tabs Take 1 tablet by mouth daily.   lactose free nutrition Liqd Take 237 mLs by mouth 2 (two) times daily between meals.   levothyroxine 75 MCG tablet Commonly known as:  SYNTHROID, LEVOTHROID TAKE 1 TABLET BY MOUTH ONCE DAILY   loratadine 10 MG tablet Commonly known as:  CLARITIN Take 1 tablet (10 mg total) by mouth daily.   LORazepam 0.5 MG tablet Commonly known as:  ATIVAN Take 0.5 mg by mouth every morning.   meclizine 12.5 MG tablet Commonly known as:  ANTIVERT Take 1 tablet (12.5 mg total) by mouth 2 (two) times daily as needed for dizziness.   metolazone 2.5 MG tablet Commonly known as:  ZAROXOLYN Take 1 tablet (2.5 mg total) by mouth as needed (for weight 134 lb or greater). What changed:    when to take this  reasons to take this   MILK OF MAGNESIA 400 MG/5ML suspension Generic drug:  magnesium hydroxide Take 30 mLs by mouth daily as needed for mild constipation.   nitroGLYCERIN 0.4 MG SL tablet Commonly known as:  NITROSTAT Place 1 tablet (0.4 mg total) under the tongue every 5 (five) minutes as needed. For chest pain   pantoprazole 40 MG tablet Commonly known as:  PROTONIX Take 1 tablet (40 mg total) by mouth daily.   polyethylene glycol packet Commonly known as:  MIRALAX Take 17 g by mouth daily. Mix with 8 ounces of liquid. What changed:    when to take this  additional instructions   potassium chloride SA 20 MEQ tablet Commonly known  as:  K-DUR,KLOR-CON Take 1 tablet (20 mEq total) by mouth daily. Take an additional 62meq with Metolazone. What changed:    when to take this  additional instructions   simvastatin 10 MG tablet Commonly known as:  ZOCOR TAKE ONE TABLET BY MOUTH AT BEDTIME   torsemide 20 MG tablet Commonly known as:  DEMADEX Take 1 tablet (20 mg total) by mouth daily. Please start on 11/22/2016 What changed:    when to take this  additional instructions    TAKE these medications   acetaminophen 325 MG tablet Commonly known as:  TYLENOL Take 650 mg by mouth every 4 (four) hours as needed (for pain).   amiodarone 200 MG tablet Commonly known as:  PACERONE Take 1 tablet (200 mg total) by mouth daily.   bisacodyl 10 MG suppository Commonly known as:  DULCOLAX Place 10 mg rectally as needed for moderate constipation.   citalopram 20 MG tablet Commonly known as:  CELEXA Take 20 mg by mouth daily.   docusate sodium 50 MG capsule Commonly known as:  COLACE Take 50 mg by mouth daily.   ELIQUIS 2.5 MG Tabs tablet Generic drug:  apixaban TAKE ONE TABLET BY MOUTH TWICE DAILY What changed:    how much to take  how to take this  when to take this   Iron 325 (65 Fe) MG Tabs Take 1 tablet by mouth daily.   lactose free nutrition Liqd Take 237 mLs by mouth 2 (two) times daily between meals.   levothyroxine 75 MCG tablet Commonly known as:  SYNTHROID, LEVOTHROID TAKE 1 TABLET BY MOUTH ONCE DAILY   loratadine 10 MG tablet Commonly known as:  CLARITIN Take 1 tablet (10 mg total) by mouth daily.   LORazepam 0.5 MG tablet Commonly known as:  ATIVAN Take 0.5 mg by mouth every morning.   meclizine 12.5 MG tablet Commonly known  as:  ANTIVERT Take 1 tablet (12.5 mg total) by mouth 2 (two) times daily as needed for dizziness.   metolazone 2.5 MG tablet Commonly known as:  ZAROXOLYN Take 1 tablet (2.5 mg total) by mouth as needed (for weight 134 lb or greater). What  changed:    when to take this  reasons to take this   MILK OF MAGNESIA 400 MG/5ML suspension Generic drug:  magnesium hydroxide Take 30 mLs by mouth daily as needed for mild constipation.   nitroGLYCERIN 0.4 MG SL tablet Commonly known as:  NITROSTAT Place 1 tablet (0.4 mg total) under the tongue every 5 (five) minutes as needed. For chest pain   pantoprazole 40 MG tablet Commonly known as:  PROTONIX Take 1 tablet (40 mg total) by mouth daily.   polyethylene glycol packet Commonly known as:  MIRALAX Take 17 g by mouth daily. Mix with 8 ounces of liquid. What changed:    when to take this  additional instructions   potassium chloride SA 20 MEQ tablet Commonly known as:  K-DUR,KLOR-CON Take 1 tablet (20 mEq total) by mouth daily. Take an additional 10meq with Metolazone. What changed:    when to take this  additional instructions   simvastatin 10 MG tablet Commonly known as:  ZOCOR TAKE ONE TABLET BY MOUTH AT BEDTIME   torsemide 20 MG tablet Commonly known as:  DEMADEX Take 1 tablet (20 mg total) by mouth daily. Please start on 11/22/2016 What changed:    when to take this  additional instructions      Relevant Imaging Results:  Relevant Lab Results:   Additional Information SS#: [id number]  Candie Chroman, LCSW

## 2017-10-16 NOTE — Discharge Summary (Signed)
Discharge Summary  ODYSSEY VASBINDER XBM:[id number] DOB: 1925/10/15  PCP: Burnis Medin, MD  Admit date: 10/14/2017 Discharge date: 10/16/2017  Time spent: 25 MINUTES  Recommendations for Outpatient Follow-up:  1. Follow up with PCP within a week 2. Follow up with cardiology within a week 3. Take your medications as prescribed 4. Fall precaution  Discharge Diagnoses:  Active Hospital Problems   Diagnosis Date Noted  . Chest pain of uncertain etiology 02/72/5366  . CAD (coronary artery disease) 07/24/2017  . PAF (paroxysmal atrial fibrillation) ([place]) 03/31/2016  . CHF exacerbation ([place]) 07/30/2015  . CKD (chronic kidney disease) stage 4, GFR 15-29 ml/min (H[facility]) 05/09/2015  . Anemia of chronic disease 05/09/2015  . Iron deficiency anemia 11/21/2014  . Chronic anticoagulation 02/25/2014  . Chronic diastolic CHF (congestive heart failure) ([place]) 02/24/2014  . Atrial fibrillation ([place]) 01/29/2014  . MGUS (monoclonal gammopathy of unknown significance) 08/01/2013  . Hypothyroidism 02/19/2012  . Hyperlipidemia 12/15/2008  . HYPERTENSION, BENIGN 12/15/2008    Resolved Hospital Problems  No resolved problems to display.    Discharge Condition: Stable  Diet recommendation: resume previous diet  Vitals:   10/16/17 0812 10/16/17 1206  BP: 123/61 (!) 112/49  Pulse: 70 69  Resp:  18  Temp:  98.2 F (36.8 C)  SpO2: 98% 97%    History of present illness:  Linda A McMullenis a 83 y.o.femalewith medical history significant forcongestiveheart failure with preserved ejection fraction,history of atrial fibrillation on Eliquis, hypertension, hyperlipidemia, GERD, presenting with chest pain waking her up around 5 in the morning, central, which she described as pressure, tightness occasionally radiating to the back.   Hospital course. All 3 sets troponin negative. Pt seen and examined at her bedside this morning she is alert but confused and does not answer questions  appropriately. Unable to obtain ROS. Getting U/a and urine culture. Cardiology following for initial complaint of chest pain. 3 sets troponin negative. EKG with no ST T elevation. Cardiology recommended continuing with current cardiac medications. Chest pain had resolved.  No sign of infective process. U/A negative. CXR no lobular iinfitrates.  On the day of discharge the patient was hemodynamically stable. She was alert and oriented x 3 and eager to return to SNF.   Hospital Course:  Active Problems:   Hyperlipidemia   HYPERTENSION, BENIGN   Hypothyroidism   MGUS (monoclonal gammopathy of unknown significance)   Atrial fibrillation (HCC)   Chronic diastolic CHF (congestive heart failure) (HCC)   Chronic anticoagulation   Iron deficiency anemia   CKD (chronic kidney disease) stage 4, GFR 15-29 ml/min (HCC)   Anemia of chronic disease   CHF exacerbation (HCC)   PAF (paroxysmal atrial fibrillation) (HCC)   CAD (coronary artery disease)   Chest pain of uncertain etiology   Chest pain r/o ACS -ACS ruled out -chest pain free -follow up with cardiology in the outpatient setting -troponin negative x 3; EKG unremarkable for ST T elevation -nitro, amiodarone, zocor  AMS, resolved -A&O x 3 -fall precaution -reorient as needed -U/A negative -cxr no lobular infiltrates  Non obstructive CAD -zocor  CKD 4 -stable -1.8 GFR 27 (1 month ago) -cr 1.71 from 1.73 -avoid nephrotoxic meds -avoid hypotension and dehydration  GERD -protonix po  Chronic thrombocytopenia -stable -plt 130 from 122 from 110 -no sign of bleeding  Hypothyroidism -levothyroxine    Procedures:  None  Consultations:  cardiology  Discharge Exam: BP (!) 112/49 (BP Location: Left Arm)   Pulse 69   Temp 98.2 F (  36.8 C) (Oral)   Resp 18   Ht 4\' 10"  (1.473 m)   Wt 54.1 kg (119 lb 4.8 oz) Comment: c scale  SpO2 97%   BMI 24.93 kg/m    General:  83 yo CF thin built, alert and  oriented x 3  Cardiovascular: IRRR no rubs or gallops  Respiratory: CTA no wheezes or rales  Abdomen: soft NT ND NBS x4  Musculoskeletal: Moves limbs. No LE edema  Skin: No noted rashes  Psychiatry: mood appropriate for condition and setting   Discharge Instructions You were cared for by a hospitalist during your hospital stay. If you have any questions about your discharge medications or the care you received while you were in the hospital after you are discharged, you can call the unit and asked to speak with the hospitalist on call if the hospitalist that took care of you is not available. Once you are discharged, your primary care physician will handle any further medical issues. Please note that NO REFILLS for any discharge medications will be authorized once you are discharged, as it is imperative that you return to your primary care physician (or establish a relationship with a primary care physician if you do not have one) for your aftercare needs so that they can reassess your need for medications and monitor your lab values.   Allergies as of 10/16/2017      Reactions   Other Shortness Of Breath   Detergents, perfumes and other strong, odor-emitting compounds   Tramadol Shortness Of Breath, Nausea Only   Ibuprofen Other (See Comments)   Listed as an allergy on MAR      Medication List    TAKE these medications   acetaminophen 325 MG tablet Commonly known as:  TYLENOL Take 650 mg by mouth every 4 (four) hours as needed (for pain).   amiodarone 200 MG tablet Commonly known as:  PACERONE Take 1 tablet (200 mg total) by mouth daily.   bisacodyl 10 MG suppository Commonly known as:  DULCOLAX Place 10 mg rectally as needed for moderate constipation.   citalopram 20 MG tablet Commonly known as:  CELEXA Take 20 mg by mouth daily.   docusate sodium 50 MG capsule Commonly known as:  COLACE Take 50 mg by mouth daily.   ELIQUIS 2.5 MG Tabs tablet Generic drug:   apixaban TAKE ONE TABLET BY MOUTH TWICE DAILY What changed:    how much to take  how to take this  when to take this   Iron 325 (65 Fe) MG Tabs Take 1 tablet by mouth daily.   lactose free nutrition Liqd Take 237 mLs by mouth 2 (two) times daily between meals.   levothyroxine 75 MCG tablet Commonly known as:  SYNTHROID, LEVOTHROID TAKE 1 TABLET BY MOUTH ONCE DAILY   loratadine 10 MG tablet Commonly known as:  CLARITIN Take 1 tablet (10 mg total) by mouth daily.   LORazepam 0.5 MG tablet Commonly known as:  ATIVAN Take 0.5 mg by mouth every morning.   meclizine 12.5 MG tablet Commonly known as:  ANTIVERT Take 1 tablet (12.5 mg total) by mouth 2 (two) times daily as needed for dizziness.   metolazone 2.5 MG tablet Commonly known as:  ZAROXOLYN Take 1 tablet (2.5 mg total) by mouth as needed (for weight 134 lb or greater). What changed:    when to take this  reasons to take this   MILK OF MAGNESIA 400 MG/5ML suspension Generic drug:  magnesium hydroxide Take 30  mLs by mouth daily as needed for mild constipation.   nitroGLYCERIN 0.4 MG SL tablet Commonly known as:  NITROSTAT Place 1 tablet (0.4 mg total) under the tongue every 5 (five) minutes as needed. For chest pain   pantoprazole 40 MG tablet Commonly known as:  PROTONIX Take 1 tablet (40 mg total) by mouth daily.   polyethylene glycol packet Commonly known as:  MIRALAX Take 17 g by mouth daily. Mix with 8 ounces of liquid. What changed:    when to take this  additional instructions   potassium chloride SA 20 MEQ tablet Commonly known as:  K-DUR,KLOR-CON Take 1 tablet (20 mEq total) by mouth daily. Take an additional 52meq with Metolazone. What changed:    when to take this  additional instructions   simvastatin 10 MG tablet Commonly known as:  ZOCOR TAKE ONE TABLET BY MOUTH AT BEDTIME   torsemide 20 MG tablet Commonly known as:  DEMADEX Take 1 tablet (20 mg total) by mouth daily. Please  start on 11/22/2016 What changed:    when to take this  additional instructions      Allergies  Allergen Reactions  . Other Shortness Of Breath    Detergents, perfumes and other strong, odor-emitting compounds  . Tramadol Shortness Of Breath and Nausea Only  . Ibuprofen Other (See Comments)    Listed as an allergy on MAR      The results of significant diagnostics from this hospitalization (including imaging, microbiology, ancillary and laboratory) are listed below for reference.    Significant Diagnostic Studies: Dg Chest 2 View  Result Date: 10/14/2017 CLINICAL DATA:  Chest pain, shortness of Breath EXAM: CHEST  2 VIEW COMPARISON:  09/02/2017 FINDINGS: Right pacer remains in place, unchanged. Cardiomegaly with vascular congestion. Interstitial prominence likely reflects early interstitial edema. No effusions. No acute bony abnormality. IMPRESSION: Cardiomegaly.  Suspect mild interstitial edema/CHF. Electronically Signed   By: Rolm Baptise M.D.   On: 10/14/2017 09:58    Microbiology: Recent Results (from the past 240 hour(s))  MRSA PCR Screening     Status: None   Collection Time: 10/14/17  6:16 PM  Result Value Ref Range Status   MRSA by PCR NEGATIVE NEGATIVE Final    Comment:        The GeneXpert MRSA Assay (FDA approved for NASAL specimens only), is one component of a comprehensive MRSA colonization surveillance program. It is not intended to diagnose MRSA infection nor to guide or monitor treatment for MRSA infections.      Labs: Basic Metabolic Panel: Recent Labs  Lab 10/14/17 0930 10/15/17 0338 10/16/17 0615  NA 137 140 140  K 4.5 4.2 4.0  CL 106 107 108  CO2 23 26 25   GLUCOSE 106* 81 109*  BUN 33* 31* 31*  CREATININE 1.73* 1.73* 1.71*  CALCIUM 9.5 9.4 9.8   Liver Function Tests: Recent Labs  Lab 10/14/17 0930  AST 31  ALT 20  ALKPHOS 88  BILITOT 0.8  PROT 6.1*  ALBUMIN 3.0*   Recent Labs  Lab 10/14/17 0930  LIPASE 29   No results  for input(s): AMMONIA in the last 168 hours. CBC: Recent Labs  Lab 10/14/17 0930 10/16/17 0615  WBC 5.0 5.0  HGB 12.2 13.6  HCT 38.2 42.3  MCV 99.2 98.1  PLT 122* 130*   Cardiac Enzymes: Recent Labs  Lab 10/15/17 1536  TROPONINI 0.05*   BNP: BNP (last 3 results) Recent Labs    11/11/16 1907 05/27/17 1745 10/14/17 0930  BNP 264.3* 459.2* 553.2*    ProBNP (last 3 results) No results for input(s): PROBNP in the last 8760 hours.  CBG: No results for input(s): GLUCAP in the last 168 hours.     Signed:  Kayleen Memos, MD [facility] 10/16/2017, 1:32 PM

## 2017-10-16 NOTE — Clinical Social Work Note (Signed)
CSW facilitated patient discharge including contacting patient family (daughter) and facility to confirm patient discharge plans. Clinical information faxed to facility and family agreeable with plan. CSW arranged ambulance transport via [facility] to [place]. They will be here between 3:00 and 3:30. RN has already called report.  CSW will sign off for now as social work intervention is no longer needed. Please consult [place] again if new needs arise.  Dayton Scrape, [place]

## 2017-10-18 ENCOUNTER — Telehealth: Payer: Self-pay | Admitting: Family Medicine

## 2017-10-18 NOTE — Telephone Encounter (Signed)
TCM left a voice message for pt to return my call.

## 2017-10-19 ENCOUNTER — Emergency Department (HOSPITAL_COMMUNITY)
Admission: EM | Admit: 2017-10-19 | Discharge: 2017-10-20 | Disposition: A | Discharge status: Home / Self Care | Payer: Medicare Other | Attending: Emergency Medicine | Admitting: Emergency Medicine

## 2017-10-19 ENCOUNTER — Emergency Department (HOSPITAL_COMMUNITY): Payer: Medicare Other

## 2017-10-19 ENCOUNTER — Other Ambulatory Visit: Payer: Self-pay

## 2017-10-19 DIAGNOSIS — Z950 Presence of cardiac pacemaker: Secondary | ICD-10-CM | POA: Diagnosis not present

## 2017-10-19 DIAGNOSIS — N184 Chronic kidney disease, stage 4 (severe): Secondary | ICD-10-CM | POA: Diagnosis not present

## 2017-10-19 DIAGNOSIS — R079 Chest pain, unspecified: Secondary | ICD-10-CM | POA: Insufficient documentation

## 2017-10-19 DIAGNOSIS — I130 Hypertensive heart and chronic kidney disease with heart failure and stage 1 through stage 4 chronic kidney disease, or unspecified chronic kidney disease: Secondary | ICD-10-CM | POA: Insufficient documentation

## 2017-10-19 DIAGNOSIS — Z7901 Long term (current) use of anticoagulants: Secondary | ICD-10-CM | POA: Diagnosis not present

## 2017-10-19 DIAGNOSIS — E039 Hypothyroidism, unspecified: Secondary | ICD-10-CM | POA: Insufficient documentation

## 2017-10-19 DIAGNOSIS — Z79899 Other long term (current) drug therapy: Secondary | ICD-10-CM | POA: Insufficient documentation

## 2017-10-19 DIAGNOSIS — I5033 Acute on chronic diastolic (congestive) heart failure: Secondary | ICD-10-CM | POA: Diagnosis not present

## 2017-10-19 DIAGNOSIS — I2510 Atherosclerotic heart disease of native coronary artery without angina pectoris: Secondary | ICD-10-CM | POA: Insufficient documentation

## 2017-10-19 LAB — BASIC METABOLIC PANEL
Anion gap: 7 (ref 5–15)
BUN: 40 mg/dL — AB (ref 6–20)
CHLORIDE: 104 mmol/L (ref 101–111)
CO2: 27 mmol/L (ref 22–32)
CREATININE: 1.68 mg/dL — AB (ref 0.44–1.00)
Calcium: 9.7 mg/dL (ref 8.9–10.3)
GFR calc Af Amer: 30 mL/min — ABNORMAL LOW (ref >60)
GFR, EST NON AFRICAN AMERICAN: 26 mL/min — AB (ref >60)
GLUCOSE: 105 mg/dL — AB (ref 65–99)
Potassium: 4.7 mmol/L (ref 3.5–5.1)
SODIUM: 138 mmol/L (ref 135–145)

## 2017-10-19 LAB — CBC
HCT: 40.3 % (ref 36.0–46.0)
Hemoglobin: 13 g/dL (ref 12.0–15.0)
MCH: 31.9 pg (ref 26.0–34.0)
MCHC: 32.3 g/dL (ref 30.0–36.0)
MCV: 98.8 fL (ref 78.0–100.0)
PLATELETS: 134 10*3/uL — AB (ref 150–400)
RBC: 4.08 MIL/uL (ref 3.87–5.11)
RDW: 14.4 % (ref 11.5–15.5)
WBC: 4.9 10*3/uL (ref 4.0–10.5)

## 2017-10-19 LAB — I-STAT TROPONIN, ED
TROPONIN I, POC: 0.06 ng/mL (ref 0.00–0.08)
TROPONIN I, POC: 0.06 ng/mL (ref 0.00–0.08)

## 2017-10-19 LAB — BRAIN NATRIURETIC PEPTIDE: B Natriuretic Peptide: 439.7 pg/mL — ABNORMAL HIGH (ref 0.0–100.0)

## 2017-10-19 NOTE — Discharge Instructions (Signed)
Please read and follow all provided instructions.  Your diagnoses today include:  1. Chest pain, unspecified type     Tests performed today include: An EKG of your heart A chest x-ray Cardiac enzymes - a blood test for heart muscle damage Blood counts and electrolytes Vital signs. See below for your results today.   Medications prescribed:   Take any prescribed medications only as directed.  Follow-up instructions: Please follow-up with your primary care provider as soon as you can for further evaluation of your symptoms.   Return instructions:  SEEK IMMEDIATE MEDICAL ATTENTION IF: You have severe chest pain, especially if the pain is crushing or pressure-like and spreads to the arms, back, neck, or jaw, or if you have sweating, nausea (feeling sick to your stomach), or shortness of breath. THIS IS AN EMERGENCY. Don't wait to see if the pain will go away. Get medical help at once. Call 911 or [phone] (operator). DO NOT drive yourself to the hospital.  Your chest pain gets worse and does not go away with rest.  You have an attack of chest pain lasting longer than usual, despite rest and treatment with the medications your caregiver has prescribed.  You wake from sleep with chest pain or shortness of breath. You feel dizzy or faint. You have chest pain not typical of your usual pain for which you originally saw your caregiver.  You have any other emergent concerns regarding your health.  Additional Information: Chest pain comes from many different causes. Your caregiver has diagnosed you as having chest pain that is not specific for one problem, but does not require admission.  You are at low risk for an acute heart condition or other serious illness.   Your vital signs today were: BP (!) 108/56 (BP Location: Right Arm)    Pulse 70    Temp 98.2 F (36.8 C) (Oral)    Resp 16    SpO2 92%  If your blood pressure (BP) was elevated above 135/85 this visit, please have this repeated by your  doctor within one month. --------------

## 2017-10-19 NOTE — ED Notes (Signed)
Report given to [facility] [employer].  Lunch tray ordered

## 2017-10-19 NOTE — ED Provider Notes (Signed)
[name] EMERGENCY DEPARTMENT Provider Note   CSN: [id number] Arrival date & time: 10/19/17  0747     History   Chief Complaint No chief complaint on file.   HPI [name] is a 83 y.o. female.  HPI  83 y.o. female with a hx of diffuse nonobstructive CAD by cath 2015, atrial fibrillation on amio and eliquis 2.5 mg BID, chronic systolic and diastolic heart failure, pulmonary hypertension, s/p St Jude PPM, HTN, HLD, MGUS, thrombocytopenia, and CKD stage IV, presents to the Emergency Department today due to chest pain. This occurred around 0530. Pt called out for help and states no one arrived. Went back to sleep. Woke up to EMS in room. States pain lasted x 1 hour. Central/epigatric area. Described as dull ache. Rates 7/10. Given NTG by EMS x 3 as well as 324 ASA. Denies chest pain currently. No SOB. No N/V. No diaphoresis. No numbness/tingling. No cough/congestion. No fevers. Recent DC from hospital on 10-16-17 for CP rule out. Seen by Cardiology during admission. Thought likely related to GI. No other symptoms noted.    Echo 07/26/17: Study Conclusions - Left ventricle: The cavity size was normal. Wall thickness was increased in a pattern of mild LVH. Systolic function was normal. The estimated ejection fraction was in the range of 60% to 65%. Wall motion was normal; there were no regional wall motion abnormalities. Doppler parameters are consistent with high ventricular filling pressure. - Aortic valve: Trileaflet; moderately thickened, moderately calcified leaflets. Valve mobility was restricted. There was mild regurgitation. - Mitral valve: Calcified annulus. Moderately thickened leaflets . There was moderate regurgitation directed posteriorly. - Left atrium: The atrium was severely dilated. Volume/bsa, ES, (1-plane Simpson&'s, A2C): 81.8 ml/m^2. - Right ventricle: Pacer wire or catheter noted in right ventricle. - Right atrium: The  atrium was severely dilated. - Tricuspid valve: There was severe regurgitation. - Pulmonary arteries: Systolic pressure was moderately increased. PA peak pressure: 60 mm Hg (S).   Past Medical History:  Diagnosis Date  . Anemia   . Atrial fibrillation ([place])    a. Dx 01/2014->Eliquis started.  . Cancer ([place])   . Chest pain    a. 2011 Neg MV;  b. 01/2014 Cath: LM 20-30, LAD nl, D1 nl, LCX nl, OM1 nl, RCA dom 30-43m, PD/PL nl, EF 65%->Med Rx; c. 03/2016 MV: no ischemia/infarct. EF 63%.  . Degenerative joint disease   . Diastolic CHF, acute on chronic ([place]) 01/31/2012  . Dyslipidemia   . Dysrhythmia   . Family history of adverse reaction to anesthesia    " multiple family members have difficulty waking "  . GERD (gastroesophageal reflux disease)   . Hemorrhoid   . History of skin cancer    tafeen/ non melanoma.   Marland Kitchen Hx of varicella   . Hypertension   . Hypothyroidism   . Monoclonal gammopathies   . Pacemaker   . Palpitations    PVCs/bigeminy on event in May 2009 revealing this was relatively asymptomatic  . Right lower quadrant abdominal pain 07/19/2012   Recurrent  With nausea    This is new since she had ct for llq pain in [facility]    R/o appendiceal problem  Hernia  Get ct scan  And plan  Fu     . Thrombocytopenia [facility])     Patient Active Problem List   Diagnosis Date Noted  . Chest pain of uncertain etiology 76/19/5093  . CAD (coronary artery disease) 07/24/2017  . CKD (chronic kidney  disease), stage IV ([place]) 07/24/2017  . Dementia 07/24/2017  . Atrial fibrillation, chronic ([place]) 07/24/2017  . Monoclonal gammopathy of unknown significance (MGUS) 07/24/2017  . Thrombocytopenia, idiopathic ([place]) 07/24/2017  . Pulmonary HTN ([place]) 07/24/2017  . Mild mitral regurgitation 07/24/2017  . Idiopathic hypotension   . Fall 05/27/2017  . Mitral regurgitation 03/10/2017  . Mitral stenosis 03/10/2017  . Chest pain 03/08/2017  . Dehydration 11/18/2016  . Intractable nausea and vomiting  11/18/2016  . Generalized weakness 11/18/2016  . Hypokalemia 11/11/2016  . Hypertensive heart disease 04/01/2016  . PAF (paroxysmal atrial fibrillation) ([place]) 03/31/2016  . Chest pain with moderate risk for cardiac etiology, negative MI and Nuc study, + muscular skeletal   . Atypical chest pain 01/21/2016  . CHF exacerbation ([place]) 07/30/2015  . CKD (chronic kidney disease) stage 4, GFR 15-29 ml/min (H[facility]) 05/09/2015  . Anemia of chronic disease 05/09/2015  . Dyslipidemia 05/09/2015  . Elevated troponin   . Iron deficiency anemia 11/21/2014  . Pacemaker 04/27/2014  . Chronic anticoagulation 02/25/2014  . Chronic diastolic CHF (congestive heart failure) ([place]) 02/24/2014  . Atrial fibrillation ([place]) 01/29/2014  . MGUS (monoclonal gammopathy of unknown significance) 08/01/2013  . Thrombocytopenia ([place])   . Hypothyroidism 02/19/2012  . Hyperlipidemia 12/15/2008  . HYPERTENSION, BENIGN 12/15/2008    Past Surgical History:  Procedure Laterality Date  . CARDIOVERSION N/A 02/26/2014   Procedure: CARDIOVERSION AT BEDSIDE;  Surgeon: Pixie Casino, MD;  Location: [facility];  Service: Cardiovascular;  Laterality: N/A;  . CARDIOVERSION N/A 04/22/2014   Procedure: CARDIOVERSION (BEDSIDE);  Surgeon: [name], MD;  Location: [facility] OR;  Service: Cardiovascular;  Laterality: N/A;  . CATARACT EXTRACTION     Bilateral  implantt  . CESAREAN SECTION     times 2  . CHOLECYSTECTOMY  1999  . ESOPHAGOGASTRODUODENOSCOPY N/A 07/06/2014   Procedure: ESOPHAGOGASTRODUODENOSCOPY (EGD);  Surgeon: Ladene Artist, MD;  Location: [facility] ENDOSCOPY;  Service: Endoscopy;  Laterality: N/A;  . INSERT / REPLACE / REMOVE PACEMAKER    . LEFT HEART CATHETERIZATION WITH CORONARY ANGIOGRAM N/A 01/29/2014   Procedure: LEFT HEART CATHETERIZATION WITH CORONARY ANGIOGRAM;  Surgeon: Blane Ohara, MD;  Location: [facility] CATH LAB;  Service: Cardiovascular;  Laterality: N/A;  . PACEMAKER INSERTION  04/23/2014   STJ Assurity dual chamber  pacemaker implanted by Dr Rayann Heman  . PERMANENT PACEMAKER INSERTION N/A 04/23/2014   Procedure: PERMANENT PACEMAKER INSERTION;  Surgeon: Evans Lance, MD;  Location: [facility] CATH LAB;  Service: Cardiovascular;  Laterality: N/A;  . RIGHT HEART CATHETERIZATION N/A 09/22/2014   Procedure: RIGHT HEART CATH;  Surgeon: Jolaine Artist, MD;  Location: [facility] CATH LAB;  Service: Cardiovascular;  Laterality: N/A;  . SHOULDER SURGERY  1996   Right     OB History    Gravida Para Term Preterm AB Living   3 3 3          SAB TAB Ectopic Multiple Live Births                   Home Medications    Prior to Admission medications   Medication Sig Start Date End Date Taking? Authorizing Provider  acetaminophen (TYLENOL) 325 MG tablet Take 650 mg by mouth every 4 (four) hours as needed (for pain).     [provider]  amiodarone (PACERONE) 200 MG tablet Take 1 tablet (200 mg total) by mouth daily. 11/25/16   Bensimhon, Shaune Pascal, MD  bisacodyl (DULCOLAX) 10 MG suppository Place 10 mg rectally as needed  for moderate constipation.    [provider]  citalopram (CELEXA) 20 MG tablet Take 20 mg by mouth daily.    [provider]  docusate sodium (COLACE) 50 MG capsule Take 50 mg by mouth daily.    [provider]  ELIQUIS 2.5 MG TABS tablet TAKE ONE TABLET BY MOUTH TWICE DAILY Patient taking differently: Take 2.5 mg by mouth two times a day 05/22/17   Bensimhon, Shaune Pascal, MD  Ferrous Sulfate (IRON) 325 (65 Fe) MG TABS Take 1 tablet by mouth daily. 07/01/16   Johnson, Clanford L, MD  lactose free nutrition (BOOST) LIQD Take 237 mLs by mouth 2 (two) times daily between meals.    [provider]  levothyroxine (SYNTHROID, LEVOTHROID) 75 MCG tablet TAKE 1 TABLET BY MOUTH ONCE DAILY 05/22/17   Panosh, Standley Brooking, MD  loratadine (CLARITIN) 10 MG tablet Take 1 tablet (10 mg total) by mouth daily. 03/10/17   Jani Gravel, MD  LORazepam (ATIVAN) 0.5 MG tablet Take 0.5 mg by mouth every  morning.     [provider]  magnesium hydroxide (MILK OF MAGNESIA) 400 MG/5ML suspension Take 30 mLs by mouth daily as needed for mild constipation.    [provider]  meclizine (ANTIVERT) 12.5 MG tablet Take 1 tablet (12.5 mg total) by mouth 2 (two) times daily as needed for dizziness. 09/14/16   Bensimhon, Shaune Pascal, MD  metolazone (ZAROXOLYN) 2.5 MG tablet Take 1 tablet (2.5 mg total) by mouth as needed (for weight 134 lb or greater). Patient taking differently: Take 2.5 mg by mouth daily as needed (if weight is 134 pounds or greater).  03/09/16   Bensimhon, Shaune Pascal, MD  nitroGLYCERIN (NITROSTAT) 0.4 MG SL tablet Place 1 tablet (0.4 mg total) under the tongue every 5 (five) minutes as needed. For chest pain Patient not taking: Reported on 10/14/2017 05/06/16   Bensimhon, Shaune Pascal, MD  pantoprazole (PROTONIX) 40 MG tablet Take 1 tablet (40 mg total) by mouth daily. 08/19/17   Duffy Bruce, MD  polyethylene glycol [facility]) packet Take 17 g by mouth daily. Mix with 8 ounces of liquid. Patient taking differently: Take 17 g by mouth every Monday, Wednesday, and Friday. Mix with 8 ounces of liquid. 08/13/17   Malvin Johns, MD  potassium chloride SA (K-DUR,KLOR-CON) 20 MEQ tablet Take 1 tablet (20 mEq total) by mouth daily. Take an additional 11meq with Metolazone. Patient taking differently: Take 20 mEq by mouth every other day.  06/09/17   Pattricia Boss, MD  simvastatin (ZOCOR) 10 MG tablet TAKE ONE TABLET BY MOUTH AT BEDTIME 02/14/17   Larey Dresser, MD  torsemide (DEMADEX) 20 MG tablet Take 1 tablet (20 mg total) by mouth daily. Please start on 11/22/2016 Patient taking differently: Take 20 mg by mouth every other day.  06/09/17   Pattricia Boss, MD    Family History Family History  Problem Relation Age of Onset  . Coronary artery disease Father   . Heart disease Father   . Heart attack Father   . Alzheimer's disease Mother   . Lung cancer Brother 19  . Lung cancer Unknown       Social History Social History   Tobacco Use  . Smoking status: Never Smoker  . Smokeless tobacco: Never Used  Substance Use Topics  . Alcohol use: No  . Drug use: No     Allergies   Other; Tramadol; and Ibuprofen   Review of Systems Review of Systems ROS reviewed and all  are negative for acute change except as noted in the HPI.  Physical Exam Updated Vital Signs BP (!) 108/56 (BP Location: Right Arm)   Pulse 70   Temp 98.2 F (36.8 C) (Oral)   Resp 16   SpO2 92%   Physical Exam  Constitutional: She is oriented to person, place, and time. She appears well-developed and well-nourished. No distress.  HENT:  Head: Normocephalic and atraumatic.  Right Ear: Tympanic membrane, external ear and ear canal normal.  Left Ear: Tympanic membrane, external ear and ear canal normal.  Nose: Nose normal.  Mouth/Throat: Uvula is midline, oropharynx is clear and moist and mucous membranes are normal. No trismus in the jaw. No oropharyngeal exudate, posterior oropharyngeal erythema or tonsillar abscesses.  Eyes: EOM are normal. Pupils are equal, round, and reactive to light.  Neck: Normal range of motion. Neck supple. No tracheal deviation present.  Cardiovascular: Normal rate, regular rhythm, S1 normal, S2 normal, normal heart sounds, intact distal pulses and normal pulses.  Pulmonary/Chest: Effort normal and breath sounds normal. No respiratory distress. She has no decreased breath sounds. She has no wheezes. She has no rhonchi. She has no rales.  Abdominal: Normal appearance and bowel sounds are normal. There is no tenderness.  Musculoskeletal: Normal range of motion.  Neurological: She is alert and oriented to person, place, and time.  Skin: Skin is warm and dry.  Psychiatric: She has a normal mood and affect. Her speech is normal and behavior is normal. Thought content normal.  Nursing note and vitals reviewed.  ED Treatments / Results  Labs (all labs ordered are listed, but  only abnormal results are displayed) Labs Reviewed  CBC - Abnormal; Notable for the following components:      Result Value   Platelets 134 (*)    All other components within normal limits  BASIC METABOLIC PANEL - Abnormal; Notable for the following components:   Glucose, Bld 105 (*)    BUN 40 (*)    Creatinine, Ser 1.68 (*)    GFR calc non Af Amer 26 (*)    GFR calc Af Amer 30 (*)    All other components within normal limits  BRAIN NATRIURETIC PEPTIDE - Abnormal; Notable for the following components:   B Natriuretic Peptide 439.7 (*)    All other components within normal limits  I-STAT TROPONIN, ED  I-STAT TROPONIN, ED    EKG  EKG Interpretation None       Radiology Dg Chest 2 View  Result Date: 10/19/2017 CLINICAL DATA:  Mid to right sided chest pain today, slight shortness of breath EXAM: CHEST  2 VIEW COMPARISON:  Chest x-ray of 10/14/2017 FINDINGS: The lungs are not as well aerated and there is slight increase in markings at the lung bases most consistent with atelectasis. No definite pneumonia is seen and no effusion is noted. Mild cardiomegaly stable and permanent pacemaker remains. No bony abnormality is seen. IMPRESSION: 1. Diminished aeration with slight increase in bibasilar atelectasis. 2. No definite pneumonia or effusion. Electronically Signed   By: Ivar Drape M.D.   On: 10/19/2017 08:36    Procedures Procedures (including critical care time)  Medications Ordered in ED Medications - No data to display   Initial Impression / Assessment and Plan / ED Course  I have reviewed the triage vital signs and the nursing notes.  Pertinent labs & imaging results that were available during my care of the patient were reviewed by me and considered in my medical decision making (  see chart for details).  Final Clinical Impressions(s) / ED Diagnoses  {I have reviewed and evaluated the relevant laboratory values. {I have reviewed and evaluated the relevant imaging studies.  {I have interpreted the relevant EKG. {I have reviewed the relevant previous healthcare records.  {I obtained HPI from historian. {Patient discussed with supervising physician.  ED Course:  Assessment: Pt is a 83 y.o. female with a hx of diffuse nonobstructive CAD by cath 2015, atrial fibrillation on amio and eliquis 2.5 mg BID, chronic systolic and diastolic heart failure, pulmonary hypertension, s/p St Jude PPM, HTN, HLD, MGUS, thrombocytopenia, and CKD stage IV, presents to the Emergency Department today due to chest pain. This occurred around 0530. Pt called out for help and states no one arrived. Went back to sleep. Woke up to EMS in room. States pain lasted x 1 hour. Central/epigatric area. Described as dull ache. Rates 7/10. Given NTG by EMS x 2. Denies chest pain currently. No SOB. No N/V. No diaphoresis. No numbness/tingling. No cough/congestion. No fevers. Recent DC from hospital on 10-16-17 for CP rule out. Seen by Cardiology during admission. Thought likely related to GI. Patient is to be discharged with recommendation to follow up with PCP in regards to today's hospital visit. Chest pain is not likely of cardiac or pulmonary etiology d/t presentation, no tracheal deviation, no JVD or new murmur, RRR, breath sounds equal bilaterally, EKG without acute abnormalities, negative troponin x2, and negative CXR. Pt has been advised start a PPI and return to the ED is CP becomes exertional, associated with diaphoresis or nausea, radiates to left jaw/arm, worsens or becomes concerning in any way. Pt appears reliable for follow up and is agreeable to discharge. Patient is in no acute distress. Vital Signs are stable. Patient is able to ambulate. Patient able to tolerate PO.   Disposition/Plan:  DC Home Additional Verbal discharge instructions given and discussed with patient.  Pt Instructed to f/u with PCP in the next week for evaluation and treatment of symptoms. Return precautions given Pt acknowledges  and agrees with plan  Supervising Physician Carmin Muskrat, MD  Final diagnoses:  Chest pain, unspecified type    ED Discharge Orders    None       Shary Decamp, PA-C 10/19/17 1233    Carmin Muskrat, MD 10/19/17 1513

## 2017-10-19 NOTE — Telephone Encounter (Signed)
I spoke with daughter and patient went back to ER today.

## 2017-10-19 NOTE — ED Triage Notes (Signed)
To ED via [facility] from [facility] on [address]-- with c/o chest pain -- was recently in hospital for same -- states called 911 from cell phone after telling staff that she was having chest pain.  Received NTG x 3, ASA 324mg , enroute. Pain 7/10 initially, after NTG 4/10.  Pt A/O x 4.

## 2017-10-27 ENCOUNTER — Inpatient Hospital Stay: Payer: Medicare Other | Admitting: Family Medicine

## 2017-11-01 ENCOUNTER — Emergency Department (HOSPITAL_COMMUNITY): Payer: Medicare Other

## 2017-11-01 ENCOUNTER — Emergency Department (HOSPITAL_COMMUNITY)
Admission: EM | Admit: 2017-11-01 | Discharge: 2017-11-02 | Disposition: A | Discharge status: Home / Self Care | Payer: Medicare Other | Attending: Emergency Medicine | Admitting: Emergency Medicine

## 2017-11-01 ENCOUNTER — Encounter (HOSPITAL_COMMUNITY): Payer: Self-pay | Admitting: Emergency Medicine

## 2017-11-01 DIAGNOSIS — S0990XA Unspecified injury of head, initial encounter: Secondary | ICD-10-CM | POA: Diagnosis present

## 2017-11-01 DIAGNOSIS — Y9389 Activity, other specified: Secondary | ICD-10-CM | POA: Diagnosis not present

## 2017-11-01 DIAGNOSIS — W19XXXA Unspecified fall, initial encounter: Secondary | ICD-10-CM

## 2017-11-01 DIAGNOSIS — Z7901 Long term (current) use of anticoagulants: Secondary | ICD-10-CM | POA: Insufficient documentation

## 2017-11-01 DIAGNOSIS — I130 Hypertensive heart and chronic kidney disease with heart failure and stage 1 through stage 4 chronic kidney disease, or unspecified chronic kidney disease: Secondary | ICD-10-CM | POA: Diagnosis not present

## 2017-11-01 DIAGNOSIS — Z950 Presence of cardiac pacemaker: Secondary | ICD-10-CM | POA: Diagnosis not present

## 2017-11-01 DIAGNOSIS — S0003XA Contusion of scalp, initial encounter: Secondary | ICD-10-CM | POA: Diagnosis not present

## 2017-11-01 DIAGNOSIS — W050XXA Fall from non-moving wheelchair, initial encounter: Secondary | ICD-10-CM | POA: Insufficient documentation

## 2017-11-01 DIAGNOSIS — E785 Hyperlipidemia, unspecified: Secondary | ICD-10-CM | POA: Insufficient documentation

## 2017-11-01 DIAGNOSIS — Y998 Other external cause status: Secondary | ICD-10-CM | POA: Diagnosis not present

## 2017-11-01 DIAGNOSIS — N184 Chronic kidney disease, stage 4 (severe): Secondary | ICD-10-CM | POA: Insufficient documentation

## 2017-11-01 DIAGNOSIS — Z79899 Other long term (current) drug therapy: Secondary | ICD-10-CM | POA: Diagnosis not present

## 2017-11-01 DIAGNOSIS — I5032 Chronic diastolic (congestive) heart failure: Secondary | ICD-10-CM | POA: Diagnosis not present

## 2017-11-01 DIAGNOSIS — E039 Hypothyroidism, unspecified: Secondary | ICD-10-CM | POA: Insufficient documentation

## 2017-11-01 DIAGNOSIS — I480 Paroxysmal atrial fibrillation: Secondary | ICD-10-CM | POA: Diagnosis not present

## 2017-11-01 DIAGNOSIS — Y9289 Other specified places as the place of occurrence of the external cause: Secondary | ICD-10-CM | POA: Insufficient documentation

## 2017-11-01 MED ORDER — LORAZEPAM 2 MG/ML IJ SOLN
0.5000 mg | Freq: Once | INTRAMUSCULAR | Status: AC
  Administered 2017-11-01: 0.5 mg via INTRAVENOUS
  Filled 2017-11-01: qty 1

## 2017-11-01 NOTE — Discharge Instructions (Addendum)
We saw you in the ER after you had a fall. All the imaging results are normal, no fractures seen. No evidence of brain bleed. Please be very careful with walking, and do everything possible to prevent falls.  Given that patient is on Eliquis, patient has potential for rebleed.  There is any acute change in her neurologic status or patient complains of severe headaches please send her to the ER.

## 2017-11-01 NOTE — ED Notes (Addendum)
On way to CT and XR 

## 2017-11-01 NOTE — ED Triage Notes (Signed)
BIB EMS from [facility] after mechanical fall while transferring from chair. Pt reports hitting head, does take Eliquis. No lac, hematoma noted. VSS, A/OX4

## 2017-11-01 NOTE — ED Notes (Signed)
Pt refuses to ambulate, very argumentative, refused to let Dr. Kathrynn Humble assess. EDP is aware and will DC.

## 2017-11-01 NOTE — ED Notes (Signed)
Pt still highly agitated and combative. Pt attempting to swing at staff. Per Dr. Kathrynn Humble OK to be DC he believes she is [place].

## 2017-11-02 NOTE — ED Notes (Signed)
Pt taken without being able to check BP on pt

## 2017-11-02 NOTE — ED Provider Notes (Signed)
[facility] EMERGENCY DEPARTMENT Provider Note   CSN: [id number] Arrival date & time: 11/01/17  2007     History   Chief Complaint Chief Complaint  Patient presents with  . Fall    HPI VIOLET SEABURY is a 83 y.o. female.  HPI Patient comes in with chief complaint of fall. Patient has history of A. fib, on blood thinners, patient also has history of CHF, anemia, thrombocytopenia. Patient reports that she was being transferred from the wheelchair at the sniff and had a fall.  Patient is having headache and she did have trauma to her head.  Patient denies any numbness, tingling, weakness, vision change, dizziness.  Patient is having some pain on the right hip as well.   Past Medical History:  Diagnosis Date  . Anemia   . Atrial fibrillation ([place])    a. Dx 01/2014->Eliquis started.  . Cancer ([place])   . Chest pain    a. 2011 Neg MV;  b. 01/2014 Cath: LM 20-30, LAD nl, D1 nl, LCX nl, OM1 nl, RCA dom 30-49m, PD/PL nl, EF 65%->Med Rx; c. 03/2016 MV: no ischemia/infarct. EF 63%.  . Degenerative joint disease   . Diastolic CHF, acute on chronic ([place]) 01/31/2012  . Dyslipidemia   . Dysrhythmia   . Family history of adverse reaction to anesthesia    " multiple family members have difficulty waking "  . GERD (gastroesophageal reflux disease)   . Hemorrhoid   . History of skin cancer    tafeen/ non melanoma.   Marland Kitchen Hx of varicella   . Hypertension   . Hypothyroidism   . Monoclonal gammopathies   . Pacemaker   . Palpitations    PVCs/bigeminy on event in May 2009 revealing this was relatively asymptomatic  . Right lower quadrant abdominal pain 07/19/2012   Recurrent  With nausea    This is new since she had ct for llq pain in [facility]    R/o appendiceal problem  Hernia  Get ct scan  And plan  Fu     . Thrombocytopenia [facility])     Patient Active Problem List   Diagnosis Date Noted  . Chest pain of uncertain etiology 28/41/3244  . CAD (coronary artery disease)  07/24/2017  . CKD (chronic kidney disease), stage IV ([place]) 07/24/2017  . Dementia 07/24/2017  . Atrial fibrillation, chronic ([place]) 07/24/2017  . Monoclonal gammopathy of unknown significance (MGUS) 07/24/2017  . Thrombocytopenia, idiopathic ([place]) 07/24/2017  . Pulmonary HTN ([place]) 07/24/2017  . Mild mitral regurgitation 07/24/2017  . Idiopathic hypotension   . Fall 05/27/2017  . Mitral regurgitation 03/10/2017  . Mitral stenosis 03/10/2017  . Chest pain 03/08/2017  . Dehydration 11/18/2016  . Intractable nausea and vomiting 11/18/2016  . Generalized weakness 11/18/2016  . Hypokalemia 11/11/2016  . Hypertensive heart disease 04/01/2016  . PAF (paroxysmal atrial fibrillation) ([place]) 03/31/2016  . Chest pain with moderate risk for cardiac etiology, negative MI and Nuc study, + muscular skeletal   . Atypical chest pain 01/21/2016  . CHF exacerbation ([place]) 07/30/2015  . CKD (chronic kidney disease) stage 4, GFR 15-29 ml/min (HCC) 05/09/2015  . Anemia of chronic disease 05/09/2015  . Dyslipidemia 05/09/2015  . Elevated troponin   . Iron deficiency anemia 11/21/2014  . Pacemaker 04/27/2014  . Chronic anticoagulation 02/25/2014  . Chronic diastolic CHF (congestive heart failure) ([place]) 02/24/2014  . Atrial fibrillation ([place]) 01/29/2014  . MGUS (monoclonal gammopathy of unknown significance) 08/01/2013  . Thrombocytopenia ([place])   . Hypothyroidism  02/19/2012  . Hyperlipidemia 12/15/2008  . HYPERTENSION, BENIGN 12/15/2008    Past Surgical History:  Procedure Laterality Date  . CARDIOVERSION N/A 02/26/2014   Procedure: CARDIOVERSION AT BEDSIDE;  Surgeon: Pixie Casino, MD;  Location: [facility];  Service: Cardiovascular;  Laterality: N/A;  . CARDIOVERSION N/A 04/22/2014   Procedure: CARDIOVERSION (BEDSIDE);  Surgeon: [name], MD;  [facility];  Service: Cardiovascular;  Laterality: N/A;  . CATARACT EXTRACTION     Bilateral  implantt  . CESAREAN SECTION     times 2  .  CHOLECYSTECTOMY  1999  . ESOPHAGOGASTRODUODENOSCOPY N/A 07/06/2014   Procedure: ESOPHAGOGASTRODUODENOSCOPY (EGD);  Surgeon: Ladene Artist, MD;  Location: [facility] ENDOSCOPY;  Service: Endoscopy;  Laterality: N/A;  . INSERT / REPLACE / REMOVE PACEMAKER    . LEFT HEART CATHETERIZATION WITH CORONARY ANGIOGRAM N/A 01/29/2014   Procedure: LEFT HEART CATHETERIZATION WITH CORONARY ANGIOGRAM;  Surgeon: Blane Ohara, MD;  Location: [facility] CATH LAB;  Service: Cardiovascular;  Laterality: N/A;  . PACEMAKER INSERTION  04/23/2014   STJ Assurity dual chamber pacemaker implanted by Dr Rayann Heman  . PERMANENT PACEMAKER INSERTION N/A 04/23/2014   Procedure: PERMANENT PACEMAKER INSERTION;  Surgeon: Evans Lance, MD;  Location: [facility] CATH LAB;  Service: Cardiovascular;  Laterality: N/A;  . RIGHT HEART CATHETERIZATION N/A 09/22/2014   Procedure: RIGHT HEART CATH;  Surgeon: Jolaine Artist, MD;  Location: [facility] C[facility] LAB;  Service: Cardiovascular;  Laterality: N/A;  . SHOULDER SURGERY  1996   Right     OB History    Gravida Para Term Preterm AB Living   3 3 3          SAB TAB Ectopic Multiple Live Births                   Home Medications    Prior to Admission medications   Medication Sig Start Date End Date Taking? Authorizing Provider  acetaminophen (TYLENOL) 325 MG tablet Take 650 mg by mouth every 4 (four) hours as needed (for pain).    Yes [provider]  amiodarone (PACERONE) 200 MG tablet Take 1 tablet (200 mg total) by mouth daily. 11/25/16  Yes Bensimhon, Shaune Pascal, MD  ARIPiprazole (ABILIFY) 5 MG tablet Take 5 mg by mouth daily.   Yes [provider]  bisacodyl (DULCOLAX) 10 MG suppository Place 10 mg rectally as needed for moderate constipation.   Yes [provider]  citalopram (CELEXA) 20 MG tablet Take 20 mg by mouth daily.   Yes [provider]  docusate sodium (COLACE) 50 MG capsule Take 50 mg by mouth daily.   Yes [provider]  ELIQUIS 2.5 MG TABS tablet  TAKE ONE TABLET BY MOUTH TWICE DAILY Patient taking differently: Take 2.5 mg by mouth two times a day 05/22/17  Yes Bensimhon, Shaune Pascal, MD  Ferrous Sulfate (IRON) 325 (65 Fe) MG TABS Take 1 tablet by mouth daily. Patient taking differently: Take 325 mg by mouth daily.  07/01/16  Yes Johnson, Clanford L, MD  lactose free nutrition (BOOST) LIQD Take 237 mLs by mouth 2 (two) times daily between meals.   Yes [provider]  levothyroxine (SYNTHROID, LEVOTHROID) 75 MCG tablet TAKE 1 TABLET BY MOUTH ONCE DAILY Patient taking differently: TAKE 75MCG BY MOUTH ONCE DAILY 05/22/17  Yes Panosh, Standley Brooking, MD  loratadine (CLARITIN) 10 MG tablet Take 1 tablet (10 mg total) by mouth daily. 03/10/17  Yes Jani Gravel, MD  LORazepam (ATIVAN) 0.5 MG tablet Take  0.5 mg by mouth every 12 (twelve) hours as needed for anxiety.    Yes [provider]  magnesium hydroxide (MILK OF MAGNESIA) 400 MG/5ML suspension Take 30 mLs by mouth daily as needed for mild constipation.   Yes [provider]  meclizine (ANTIVERT) 12.5 MG tablet Take 1 tablet (12.5 mg total) by mouth 2 (two) times daily as needed for dizziness. 09/14/16  Yes Bensimhon, Shaune Pascal, MD  metolazone (ZAROXOLYN) 2.5 MG tablet Take 1 tablet (2.5 mg total) by mouth as needed (for weight 134 lb or greater). Patient taking differently: Take 2.5 mg by mouth as needed (for weight 134 lb or greater). Take with potassium 20 meq of weight is gain greater than 134 lbs 03/09/16  Yes Bensimhon, Shaune Pascal, MD  nitroGLYCERIN (NITROSTAT) 0.4 MG SL tablet Place 1 tablet (0.4 mg total) under the tongue every 5 (five) minutes as needed. For chest pain 05/06/16  Yes Bensimhon, Shaune Pascal, MD  pantoprazole (PROTONIX) 40 MG tablet Take 1 tablet (40 mg total) by mouth daily. 08/19/17  Yes Duffy Bruce, MD  polyethylene glycol [facility]) packet Take 17 g by mouth daily. Mix with 8 ounces of liquid. 08/13/17  Yes Malvin Johns, MD  potassium chloride SA (K-DUR,KLOR-CON) 20  MEQ tablet Take 1 tablet (20 mEq total) by mouth daily. Take an additional 23meq with Metolazone. Patient taking differently: Take 20 mEq by mouth daily as needed. Take with Metolazone 2.5 if weight is greater than 134 lbs 06/09/17  Yes Ray, Andee Poles, MD  simvastatin (ZOCOR) 10 MG tablet TAKE ONE TABLET BY MOUTH AT BEDTIME Patient taking differently: TAKE 10MG  BY MOUTH AT BEDTIME 02/14/17  Yes Larey Dresser, MD  torsemide (DEMADEX) 20 MG tablet Take 1 tablet (20 mg total) by mouth daily. Please start on 11/22/2016 Patient taking differently: Take 20 mg by mouth every other day.  06/09/17  Yes Pattricia Boss, MD    Family History Family History  Problem Relation Age of Onset  . Coronary artery disease Father   . Heart disease Father   . Heart attack Father   . Alzheimer's disease Mother   . Lung cancer Brother 57  . Lung cancer Unknown     Social History Social History   Tobacco Use  . Smoking status: Never Smoker  . Smokeless tobacco: Never Used  Substance Use Topics  . Alcohol use: No  . Drug use: No     Allergies   Other; Tramadol; and Ibuprofen   Review of Systems Review of Systems  All other systems reviewed and are negative.    Physical Exam Updated Vital Signs BP (!) 103/45   Pulse 87   Temp 97.7 F (36.5 C) (Oral)   Resp 14   Ht 4\' 10"  (1.473 m)   Wt 51.7 kg (114 lb)   SpO2 100%   BMI 23.83 kg/m   Physical Exam  Constitutional: She is oriented to person, place, and time. She appears well-developed.  HENT:  Head: Normocephalic and atraumatic.  Eyes: EOM are normal.  Neck: Normal range of motion. Neck supple.  Cardiovascular: Normal rate.  Pulmonary/Chest: Effort normal.  Abdominal: Bowel sounds are normal.  Musculoskeletal:  Head to toe evaluation shows no hematoma, bleeding of the scalp, no facial abrasions, no spine step offs, crepitus of the chest or neck, no tenderness to palpation of the bilateral upper and lower extremities, no gross  deformities, no chest tenderness, no pelvic pain.   Neurological: She is alert and oriented to person, place,  and time.  Skin: Skin is warm and dry.  Nursing note and vitals reviewed.    ED Treatments / Results  Labs (all labs ordered are listed, but only abnormal results are displayed) Labs Reviewed - No data to display  EKG  EKG Interpretation None       Radiology Dg Pelvis 1-2 Views  Result Date: 11/01/2017 CLINICAL DATA:  Fall transferring to wheelchair today. Right hip pain. EXAM: PELVIS - 1-2 VIEW COMPARISON:  None. FINDINGS: The cortical margins of the bony pelvis are intact. No fracture. Pubic symphysis and sacroiliac joints are congruent. Both femoral heads are well-seated in the respective acetabula. Age related degenerative change of both hips. Scoliotic curvature in the included lower lumbar spine. IMPRESSION: No pelvic fracture. Electronically Signed   By: Jeb Levering M.D.   On: 11/01/2017 21:14   Ct Head Wo Contrast  Result Date: 11/01/2017 CLINICAL DATA:  83 y/o  F; status post fall with head injury. EXAM: CT HEAD WITHOUT CONTRAST CT CERVICAL SPINE WITHOUT CONTRAST TECHNIQUE: Multidetector CT imaging of the head and cervical spine was performed following the standard protocol without intravenous contrast. Multiplanar CT image reconstructions of the cervical spine were also generated. COMPARISON:  05/27/2017 CT head.  09/10/2015 CT cervical spine. FINDINGS: CT HEAD FINDINGS Brain: No evidence of acute infarction, hemorrhage, hydrocephalus, extra-axial collection or mass lesion/mass effect. Stable chronic microvascular ischemic changes and parenchymal volume loss of the brain. Vascular: Extensive calcific atherosclerosis of carotid siphons. Skull: Normal. Negative for fracture or focal lesion. Sinuses/Orbits: No acute finding. Other: Bilateral intra-ocular lens replacement. CT CERVICAL SPINE FINDINGS Alignment: Mild cervical dextrocurvature with apex at C7.  Straightening of cervical lordosis. Stable grade 1 C7-T1 anterolisthesis. Skull base and vertebrae: No acute fracture. No primary bone lesion or focal pathologic process. Soft tissues and spinal canal: No prevertebral fluid or swelling. No visible canal hematoma. Disc levels: Moderate cervical spondylosis with prominent upper cervical facet arthrosis and discogenic degenerative changes greatest at the C5-C7 levels. No high-grade bony canal stenosis. Upper chest: Smooth interlobular septal thickening likely representing interstitial pulmonary edema. Other: Multinodular thyroid goiter with enlargement predominantly of the right lobe of the thyroid. Calcific atherosclerosis of the aortic arch and severe calcifications of bilateral carotid bifurcations. IMPRESSION: CT head: 1. No acute intracranial abnormality or calvarial fracture. 2. Stable chronic microvascular ischemic changes and parenchymal volume loss of the brain. CT cervical spine: 1. No acute fracture or dislocation. 2. Stable cervical spondylosis with prominent discogenic degenerative changes greatest at the C5-C7 levels. No high-grade bony canal stenosis. 3. Interstitial pulmonary edema. 4. Multinodular thyroid goiter. Electronically Signed   By: Kristine Garbe M.D.   On: 11/01/2017 21:13   Ct Cervical Spine Wo Contrast  Result Date: 11/01/2017 CLINICAL DATA:  83 y/o  F; status post fall with head injury. EXAM: CT HEAD WITHOUT CONTRAST CT CERVICAL SPINE WITHOUT CONTRAST TECHNIQUE: Multidetector CT imaging of the head and cervical spine was performed following the standard protocol without intravenous contrast. Multiplanar CT image reconstructions of the cervical spine were also generated. COMPARISON:  05/27/2017 CT head.  09/10/2015 CT cervical spine. FINDINGS: CT HEAD FINDINGS Brain: No evidence of acute infarction, hemorrhage, hydrocephalus, extra-axial collection or mass lesion/mass effect. Stable chronic microvascular ischemic changes and  parenchymal volume loss of the brain. Vascular: Extensive calcific atherosclerosis of carotid siphons. Skull: Normal. Negative for fracture or focal lesion. Sinuses/Orbits: No acute finding. Other: Bilateral intra-ocular lens replacement. CT CERVICAL SPINE FINDINGS Alignment: Mild cervical dextrocurvature with apex at C7. Straightening  of cervical lordosis. Stable grade 1 C7-T1 anterolisthesis. Skull base and vertebrae: No acute fracture. No primary bone lesion or focal pathologic process. Soft tissues and spinal canal: No prevertebral fluid or swelling. No visible canal hematoma. Disc levels: Moderate cervical spondylosis with prominent upper cervical facet arthrosis and discogenic degenerative changes greatest at the C5-C7 levels. No high-grade bony canal stenosis. Upper chest: Smooth interlobular septal thickening likely representing interstitial pulmonary edema. Other: Multinodular thyroid goiter with enlargement predominantly of the right lobe of the thyroid. Calcific atherosclerosis of the aortic arch and severe calcifications of bilateral carotid bifurcations. IMPRESSION: CT head: 1. No acute intracranial abnormality or calvarial fracture. 2. Stable chronic microvascular ischemic changes and parenchymal volume loss of the brain. CT cervical spine: 1. No acute fracture or dislocation. 2. Stable cervical spondylosis with prominent discogenic degenerative changes greatest at the C5-C7 levels. No high-grade bony canal stenosis. 3. Interstitial pulmonary edema. 4. Multinodular thyroid goiter. Electronically Signed   By: Kristine Garbe M.D.   On: 11/01/2017 21:13    Procedures Procedures (including critical care time)  Medications Ordered in ED Medications  LORazepam (ATIVAN) injection 0.5 mg (0.5 mg Intravenous Given 11/01/17 2250)     Initial Impression / Assessment and Plan / ED Course  I have reviewed the triage vital signs and the nursing notes.  Pertinent labs & imaging results that  were available during my care of the patient were reviewed by me and considered in my medical decision making (see chart for details).  Clinical Course as of Nov 02 56  Wed Nov 01, 2017  2241 CT scan of the patient and her x-rays are normal. Patient is moving all 4 extremity. Patient refuses to stand up for [place].  It is noted that patient clearly is able to move her legs without significant difficulty, therefore we will not pursue having her stand up.  I suspect that patient is sundowning - concussion also possible. The pupils are 2 mm  and equal, doubt re-bleed.  [AN]    Clinical Course User Index [AN] Varney Biles, MD    DDx includes: - Mechanical falls - ICH - Fractures - Contusions - Soft tissue injury  Patient comes in after a mechanical fall.  Patient has history of A. fib and she is on Eliquis.  Patient is complaining of headache and she also has lower head pain, CT head and C-spine ordered.  Pelvic x-ray also ordered given that patient is having right-sided pelvic pain.  Final Clinical Impressions(s) / ED Diagnoses   Final diagnoses:  Injury of head, initial encounter  Contusion of scalp, initial encounter  Fall, initial encounter    ED Discharge Orders    None       Varney Biles, MD 11/02/17 [phone]

## 2017-11-15 ENCOUNTER — Emergency Department (HOSPITAL_COMMUNITY): Payer: Medicare Other

## 2017-11-15 ENCOUNTER — Encounter (HOSPITAL_COMMUNITY): Payer: Self-pay

## 2017-11-15 ENCOUNTER — Other Ambulatory Visit: Payer: Self-pay

## 2017-11-15 ENCOUNTER — Emergency Department (HOSPITAL_COMMUNITY)
Admission: EM | Admit: 2017-11-15 | Discharge: 2017-11-15 | Disposition: A | Discharge status: Home / Self Care | Payer: Medicare Other | Attending: Emergency Medicine | Admitting: Emergency Medicine

## 2017-11-15 DIAGNOSIS — N184 Chronic kidney disease, stage 4 (severe): Secondary | ICD-10-CM | POA: Diagnosis not present

## 2017-11-15 DIAGNOSIS — Y939 Activity, unspecified: Secondary | ICD-10-CM | POA: Insufficient documentation

## 2017-11-15 DIAGNOSIS — F0390 Unspecified dementia without behavioral disturbance: Secondary | ICD-10-CM | POA: Diagnosis not present

## 2017-11-15 DIAGNOSIS — Y92129 Unspecified place in nursing home as the place of occurrence of the external cause: Secondary | ICD-10-CM | POA: Insufficient documentation

## 2017-11-15 DIAGNOSIS — R51 Headache: Secondary | ICD-10-CM | POA: Diagnosis present

## 2017-11-15 DIAGNOSIS — Z950 Presence of cardiac pacemaker: Secondary | ICD-10-CM | POA: Diagnosis not present

## 2017-11-15 DIAGNOSIS — I2510 Atherosclerotic heart disease of native coronary artery without angina pectoris: Secondary | ICD-10-CM | POA: Diagnosis not present

## 2017-11-15 DIAGNOSIS — Y999 Unspecified external cause status: Secondary | ICD-10-CM | POA: Insufficient documentation

## 2017-11-15 DIAGNOSIS — E039 Hypothyroidism, unspecified: Secondary | ICD-10-CM | POA: Diagnosis not present

## 2017-11-15 DIAGNOSIS — Z79899 Other long term (current) drug therapy: Secondary | ICD-10-CM | POA: Diagnosis not present

## 2017-11-15 DIAGNOSIS — I5033 Acute on chronic diastolic (congestive) heart failure: Secondary | ICD-10-CM | POA: Diagnosis not present

## 2017-11-15 DIAGNOSIS — W19XXXA Unspecified fall, initial encounter: Secondary | ICD-10-CM | POA: Diagnosis not present

## 2017-11-15 DIAGNOSIS — Z85828 Personal history of other malignant neoplasm of skin: Secondary | ICD-10-CM | POA: Diagnosis not present

## 2017-11-15 DIAGNOSIS — I130 Hypertensive heart and chronic kidney disease with heart failure and stage 1 through stage 4 chronic kidney disease, or unspecified chronic kidney disease: Secondary | ICD-10-CM | POA: Diagnosis not present

## 2017-11-15 LAB — CBC WITH DIFFERENTIAL/PLATELET
BASOS ABS: 0 10*3/uL (ref 0.0–0.1)
BASOS PCT: 0 %
Eosinophils Absolute: 0 10*3/uL (ref 0.0–0.7)
Eosinophils Relative: 1 %
HEMATOCRIT: 44.3 % (ref 36.0–46.0)
HEMOGLOBIN: 14.2 g/dL (ref 12.0–15.0)
LYMPHS PCT: 12 %
Lymphs Abs: 0.6 10*3/uL — ABNORMAL LOW (ref 0.7–4.0)
MCH: 31.6 pg (ref 26.0–34.0)
MCHC: 32.1 g/dL (ref 30.0–36.0)
MCV: 98.7 fL (ref 78.0–100.0)
MONO ABS: 0.3 10*3/uL (ref 0.1–1.0)
MONOS PCT: 5 %
NEUTROS ABS: 4.4 10*3/uL (ref 1.7–7.7)
NEUTROS PCT: 82 %
Platelets: 129 10*3/uL — ABNORMAL LOW (ref 150–400)
RBC: 4.49 MIL/uL (ref 3.87–5.11)
RDW: 14.2 % (ref 11.5–15.5)
WBC: 5.3 10*3/uL (ref 4.0–10.5)

## 2017-11-15 LAB — URINALYSIS, ROUTINE W REFLEX MICROSCOPIC
Bilirubin Urine: NEGATIVE
GLUCOSE, UA: NEGATIVE mg/dL
Hgb urine dipstick: NEGATIVE
Ketones, ur: NEGATIVE mg/dL
LEUKOCYTES UA: NEGATIVE
NITRITE: NEGATIVE
PH: 5 (ref 5.0–8.0)
Protein, ur: NEGATIVE mg/dL
SPECIFIC GRAVITY, URINE: 1.009 (ref 1.005–1.030)

## 2017-11-15 LAB — BASIC METABOLIC PANEL
ANION GAP: 11 (ref 5–15)
BUN: 31 mg/dL — ABNORMAL HIGH (ref 6–20)
CALCIUM: 9.8 mg/dL (ref 8.9–10.3)
CHLORIDE: 103 mmol/L (ref 101–111)
CO2: 28 mmol/L (ref 22–32)
Creatinine, Ser: 1.9 mg/dL — ABNORMAL HIGH (ref 0.44–1.00)
GFR calc non Af Amer: 22 mL/min — ABNORMAL LOW (ref >60)
GFR, EST AFRICAN AMERICAN: 26 mL/min — AB (ref >60)
GLUCOSE: 95 mg/dL (ref 65–99)
Potassium: 3.5 mmol/L (ref 3.5–5.1)
Sodium: 142 mmol/L (ref 135–145)

## 2017-11-15 NOTE — ED Notes (Addendum)
Per facility pt has been refusing care, lethargic, and combative for 2 days ago, pt knows her name only at facility.

## 2017-11-15 NOTE — ED Notes (Signed)
Pt stable and states understanding d/c instructions

## 2017-11-15 NOTE — ED Triage Notes (Signed)
Pt arrives EMS from NH where pt had u nwitnesswed fall on elequis. Pt has dementia and is at baseline. Arrives with owel roll to neck.

## 2017-11-15 NOTE — ED Provider Notes (Signed)
[facility] EMERGENCY DEPARTMENT Provider Note  CSN: [id number] Arrival date & time: 11/15/17 1704  Chief Complaint(s) Fall  HPI [name] is a 84 y.o. female with a history of atrial fibrillation on anticoagulation, dementia who presents to the emergency department after an unwitnessed fall at the skilled nursing facility.  Skilled nursing facility reported to EMS that the patient is typically oriented only to self.  They reported that the patient has been more agitated over the past several days.  They deny any infectious symptoms.  Remainder of history, ROS, and physical exam limited due to patient's condition (dementia). Additional information was obtained from EMS and sNF.   Level V Caveat.  For me the patient is oriented x4.  She is aware that she is here because she fell.  She states that she cannot remember the fall but remembers feeling unsteady on her feet and tripping over her feet just prior to the fall.  She is unsure of head trauma.  Currently denies any physical complaints other than needing to void.  HPI  Past Medical History Past Medical History:  Diagnosis Date  . Anemia   . Atrial fibrillation ([place])    a. Dx 01/2014->Eliquis started.  . Cancer ([place])   . Chest pain    a. 2011 Neg MV;  b. 01/2014 Cath: LM 20-30, LAD nl, D1 nl, LCX nl, OM1 nl, RCA dom 30-45m, PD/PL nl, EF 65%->Med Rx; c. 03/2016 MV: no ischemia/infarct. EF 63%.  . Degenerative joint disease   . Diastolic CHF, acute on chronic ([place]) 01/31/2012  . Dyslipidemia   . Dysrhythmia   . Family history of adverse reaction to anesthesia    " multiple family members have difficulty waking "  . GERD (gastroesophageal reflux disease)   . Hemorrhoid   . History of skin cancer    tafeen/ non melanoma.   Marland Kitchen Hx of varicella   . Hypertension   . Hypothyroidism   . Monoclonal gammopathies   . Pacemaker   . Palpitations    PVCs/bigeminy on event in May 2009 revealing this was relatively  asymptomatic  . Right lower quadrant abdominal pain 07/19/2012   Recurrent  With nausea    This is new since she had ct for llq pain in [facility]    R/o appendiceal problem  Hernia  Get ct scan  And plan  Fu     . Thrombocytopenia [facility])    Patient Active Problem List   Diagnosis Date Noted  . Chest pain of uncertain etiology 56/31/4970  . CAD (coronary artery disease) 07/24/2017  . CKD (chronic kidney disease), stage IV ([place]) 07/24/2017  . Dementia 07/24/2017  . Atrial fibrillation, chronic ([place]) 07/24/2017  . Monoclonal gammopathy of unknown significance (MGUS) 07/24/2017  . Thrombocytopenia, idiopathic ([place]) 07/24/2017  . Pulmonary HTN ([place]) 07/24/2017  . Mild mitral regurgitation 07/24/2017  . Idiopathic hypotension   . Fall 05/27/2017  . Mitral regurgitation 03/10/2017  . Mitral stenosis 03/10/2017  . Chest pain 03/08/2017  . Dehydration 11/18/2016  . Intractable nausea and vomiting 11/18/2016  . Generalized weakness 11/18/2016  . Hypokalemia 11/11/2016  . Hypertensive heart disease 04/01/2016  . PAF (paroxysmal atrial fibrillation) ([place]) 03/31/2016  . Chest pain with moderate risk for cardiac etiology, negative MI and Nuc study, + muscular skeletal   . Atypical chest pain 01/21/2016  . CHF exacerbation ([place]) 07/30/2015  . CKD (chronic kidney disease) stage 4, GFR 15-29 ml/min (HCC) 05/09/2015  . Anemia of chronic disease 05/09/2015  .  Dyslipidemia 05/09/2015  . Elevated troponin   . Iron deficiency anemia 11/21/2014  . Pacemaker 04/27/2014  . Chronic anticoagulation 02/25/2014  . Chronic diastolic CHF (congestive heart failure) ([place]) 02/24/2014  . Atrial fibrillation ([place]) 01/29/2014  . MGUS (monoclonal gammopathy of unknown significance) 08/01/2013  . Thrombocytopenia ([place])   . Hypothyroidism 02/19/2012  . Hyperlipidemia 12/15/2008  . HYPERTENSION, BENIGN 12/15/2008   Home Medication(s) Prior to Admission medications   Medication Sig Start Date End Date Taking?  Authorizing Provider  acetaminophen (TYLENOL) 325 MG tablet Take 650 mg by mouth every 4 (four) hours as needed (for pain).     [provider]  amiodarone (PACERONE) 200 MG tablet Take 1 tablet (200 mg total) by mouth daily. 11/25/16   Bensimhon, Shaune Pascal, MD  ARIPiprazole (ABILIFY) 5 MG tablet Take 5 mg by mouth daily.    [provider]  bisacodyl (DULCOLAX) 10 MG suppository Place 10 mg rectally as needed for moderate constipation.    [provider]  citalopram (CELEXA) 20 MG tablet Take 20 mg by mouth daily.    [provider]  docusate sodium (COLACE) 50 MG capsule Take 50 mg by mouth daily.    [provider]  ELIQUIS 2.5 MG TABS tablet TAKE ONE TABLET BY MOUTH TWICE DAILY Patient taking differently: Take 2.5 mg by mouth two times a day 05/22/17   Bensimhon, Shaune Pascal, MD  Ferrous Sulfate (IRON) 325 (65 Fe) MG TABS Take 1 tablet by mouth daily. Patient taking differently: Take 325 mg by mouth daily.  07/01/16   Johnson, Clanford L, MD  lactose free nutrition (BOOST) LIQD Take 237 mLs by mouth 2 (two) times daily between meals.    [provider]  levothyroxine (SYNTHROID, LEVOTHROID) 75 MCG tablet TAKE 1 TABLET BY MOUTH ONCE DAILY Patient taking differently: TAKE 75MCG BY MOUTH ONCE DAILY 05/22/17   Panosh, Standley Brooking, MD  loratadine (CLARITIN) 10 MG tablet Take 1 tablet (10 mg total) by mouth daily. 03/10/17   Jani Gravel, MD  LORazepam (ATIVAN) 0.5 MG tablet Take 0.5 mg by mouth every 12 (twelve) hours as needed for anxiety.     [provider]  magnesium hydroxide (MILK OF MAGNESIA) 400 MG/5ML suspension Take 30 mLs by mouth daily as needed for mild constipation.    [provider]  meclizine (ANTIVERT) 12.5 MG tablet Take 1 tablet (12.5 mg total) by mouth 2 (two) times daily as needed for dizziness. 09/14/16   Bensimhon, Shaune Pascal, MD  metolazone (ZAROXOLYN) 2.5 MG tablet Take 1 tablet (2.5 mg total) by mouth as needed (for  weight 134 lb or greater). Patient taking differently: Take 2.5 mg by mouth as needed (for weight 134 lb or greater). Take with potassium 20 meq of weight is gain greater than 134 lbs 03/09/16   Bensimhon, Shaune Pascal, MD  nitroGLYCERIN (NITROSTAT) 0.4 MG SL tablet Place 1 tablet (0.4 mg total) under the tongue every 5 (five) minutes as needed. For chest pain 05/06/16   Bensimhon, Shaune Pascal, MD  pantoprazole (PROTONIX) 40 MG tablet Take 1 tablet (40 mg total) by mouth daily. 08/19/17   Duffy Bruce, MD  polyethylene glycol [facility]) packet Take 17 g by mouth daily. Mix with 8 ounces of liquid. 08/13/17   Malvin Johns, MD  potassium chloride SA (K-DUR,KLOR-CON) 20 MEQ tablet Take 1 tablet (20 mEq total) by mouth daily. Take an additional 62meq with Metolazone. Patient taking differently: Take 20 mEq by mouth daily as needed. Take with Metolazone  2.5 if weight is greater than 134 lbs 06/09/17   Pattricia Boss, MD  simvastatin (ZOCOR) 10 MG tablet TAKE ONE TABLET BY MOUTH AT BEDTIME Patient taking differently: TAKE 10MG  BY MOUTH AT BEDTIME 02/14/17   Larey Dresser, MD  torsemide (DEMADEX) 20 MG tablet Take 1 tablet (20 mg total) by mouth daily. Please start on 11/22/2016 Patient taking differently: Take 20 mg by mouth every other day.  06/09/17   Pattricia Boss, MD                                                                                                                                    Past Surgical History Past Surgical History:  Procedure Laterality Date  . CARDIOVERSION N/A 02/26/2014   Procedure: CARDIOVERSION AT BEDSIDE;  Surgeon: Pixie Casino, MD;  Location: [facility];  Service: Cardiovascular;  Laterality: N/A;  . CARDIOVERSION N/A 04/22/2014   Procedure: CARDIOVERSION (BEDSIDE);  Surgeon: [name], MD;  Location: [facility] OR;  Service: Cardiovascular;  Laterality: N/A;  . CATARACT EXTRACTION     Bilateral  implantt  . CESAREAN SECTION     times 2  . CHOLECYSTECTOMY  1999  .  ESOPHAGOGASTRODUODENOSCOPY N/A 07/06/2014   Procedure: ESOPHAGOGASTRODUODENOSCOPY (EGD);  Surgeon: Ladene Artist, MD;  Location: [facility] ENDOSCOPY;  Service: Endoscopy;  Laterality: N/A;  . INSERT / REPLACE / REMOVE PACEMAKER    . LEFT HEART CATHETERIZATION WITH CORONARY ANGIOGRAM N/A 01/29/2014   Procedure: LEFT HEART CATHETERIZATION WITH CORONARY ANGIOGRAM;  Surgeon: Blane Ohara, MD;  Location: [facility] C[facility] LAB;  Service: Cardiovascular;  Laterality: N/A;  . PACEMAKER INSERTION  04/23/2014   STJ Assurity dual chamber pacemaker implanted by Dr Rayann Heman  . PERMANENT PACEMAKER INSERTION N/A 04/23/2014   Procedure: PERMANENT PACEMAKER INSERTION;  Surgeon: Evans Lance, MD;  Location: [facility] CATH LAB;  Service: Cardiovascular;  Laterality: N/A;  . RIGHT HEART CATHETERIZATION N/A 09/22/2014   Procedure: RIGHT HEART CATH;  Surgeon: Jolaine Artist, MD;  Location: [facility] CATH LAB;  Service: Cardiovascular;  Laterality: N/A;  . SHOULDER SURGERY  1996   Right    Family History Family History  Problem Relation Age of Onset  . Coronary artery disease Father   . Heart disease Father   . Heart attack Father   . Alzheimer's disease Mother   . Lung cancer Brother 51  . Lung cancer Unknown     Social History Social History   Tobacco Use  . Smoking status: Never Smoker  . Smokeless tobacco: Never Used  Substance Use Topics  . Alcohol use: No  . Drug use: No   Allergies Other; Tramadol; and Ibuprofen  Review of Systems Review of Systems All other systems are reviewed and are negative for acute change except as noted in the HPI  Physical Exam Vital Signs  I have reviewed the triage vital signs BP (!) 146/88   Pulse 63   Temp  98.7 F (37.1 C) (Oral)   Resp 19   Ht 5' (1.524 m)   Wt 52.2 kg (115 lb)   SpO2 100%   BMI 22.46 kg/m   Physical Exam  Constitutional: She is oriented to person, place, and time. She appears well-developed and well-nourished. No distress.  HENT:  Head: Normocephalic  and atraumatic.  Right Ear: External ear normal.  Left Ear: External ear normal.  Nose: Nose normal.  Eyes: Conjunctivae and EOM are normal. Pupils are equal, round, and reactive to light. Right eye exhibits no discharge. Left eye exhibits no discharge. No scleral icterus.  Neck: Normal range of motion. Neck supple. No spinous process tenderness and no muscular tenderness present.  Cardiovascular: Normal rate, regular rhythm and normal heart sounds. Exam reveals no gallop and no friction rub.  No murmur heard. Pulses:      Radial pulses are 2+ on the right side, and 2+ on the left side.       Dorsalis pedis pulses are 2+ on the right side, and 2+ on the left side.  Pulmonary/Chest: Effort normal and breath sounds normal. No stridor. No respiratory distress. She has no wheezes. She has no rales.  Abdominal: Soft. She exhibits no distension. There is no tenderness.  Musculoskeletal: She exhibits no edema or tenderness.       Cervical back: She exhibits no bony tenderness.       Thoracic back: She exhibits no bony tenderness.       Lumbar back: She exhibits no bony tenderness.  Clavicles stable. Chest stable to AP/Lat compression. Pelvis stable to Lat compression. No obvious extremity deformity. No chest or abdominal wall contusion.  Neurological: She is alert and oriented to person, place, and time.  Follows commands. moving all extremities with 5/5 strength and symmetric throughout.  Skin: Skin is warm and dry. No rash noted. She is not diaphoretic. No erythema.  Psychiatric: She has a normal mood and affect.  Vitals reviewed.   ED Results and Treatments Labs (all labs ordered are listed, but only abnormal results are displayed) Labs Reviewed  CBC WITH DIFFERENTIAL/PLATELET - Abnormal; Notable for the following components:      Result Value   Platelets 129 (*)    Lymphs Abs 0.6 (*)    All other components within normal limits  BASIC METABOLIC PANEL - Abnormal; Notable for the  following components:   BUN 31 (*)    Creatinine, Ser 1.90 (*)    GFR calc non Af Amer 22 (*)    GFR calc Af Amer 26 (*)    All other components within normal limits  URINALYSIS, ROUTINE W REFLEX MICROSCOPIC                                                                                                                         EKG  EKG Interpretation  Date/Time:    Ventricular Rate:    PR Interval:    QRS Duration:   QT Interval:  QTC Calculation:   R Axis:     Text Interpretation:        Radiology Ct Head Wo Contrast  Result Date: 11/15/2017 CLINICAL DATA:  Pain following fall.  Underlying dementia EXAM: CT HEAD WITHOUT CONTRAST TECHNIQUE: Contiguous axial images were obtained from the base of the skull through the vertex without intravenous contrast. COMPARISON:  November 01, 2017 FINDINGS: Brain: There is mild diffuse atrophy. There is no intracranial mass, hemorrhage, extra-axial fluid collection, or midline shift. There is patchy small vessel disease in the centra semiovale bilaterally. Elsewhere gray-white compartments appear normal. No acute infarct evident. Vascular: No hyperdense vessel. There is calcification in each carotid siphon region. There is also calcification in both distal vertebral arteries. Skull: Bony calvarium appears intact. Sinuses/Orbits: There is mucosal thickening in several ethmoid air cells. Other paranasal sinuses are clear. Orbits appear symmetric bilaterally. Other: Mastoid air cells are clear. IMPRESSION: Mild atrophy with patchy periventricular small vessel disease, stable. No acute infarct evident. No mass or hemorrhage. There are foci of arterial vascular calcification. There is mucosal thickening in several ethmoid air cells. Electronically Signed   By: Lowella Grip III M.D.   On: 11/15/2017 18:00   Pertinent labs & imaging results that were available during my care of the patient were reviewed by me and considered in my medical decision making  (see chart for details).  Medications Ordered in ED Medications - No data to display                                                                                                                                  Procedures Procedures  (including critical care time)  Medical Decision Making / ED Course I have reviewed the nursing notes for this encounter and the patient's prior records (if available in EHR or on provided paperwork).    CT head without ICH.  Labs close to patient's baseline and grossly reassuring.  UA without evidence of infection.  No other signs of trauma noted on exam requiring additional workup.  The patient appears reasonably screened and/or stabilized for discharge and I doubt any other medical condition or other [facility] requiring further screening, evaluation, or treatment in the ED at this time prior to discharge.  The patient is safe for discharge with strict return precautions.   Final Clinical Impression(s) / ED Diagnoses Final diagnoses:  Fall, initial encounter    Disposition: Discharge  Condition: Good   ED Discharge Orders    None       Follow Up: Burnis Medin, MD [facility] [place] [id number] [phone]  Schedule an appointment as soon as possible for a visit  As needed     This chart was dictated using voice recognition software.  Despite best efforts to proofread,  errors can occur which can change the documentation meaning.   Fatima Blank, MD 11/15/17 2115

## 2017-11-15 NOTE — ED Notes (Signed)
Pt from [facility]

## 2017-11-15 NOTE — ED Notes (Signed)
Attempted to complete triage questions and pt will rouse but does not answer questions. States she has lost her voice.

## 2017-11-21 ENCOUNTER — Ambulatory Visit: Payer: Medicare Other | Admitting: Diagnostic Neuroimaging

## 2017-11-22 ENCOUNTER — Ambulatory Visit (INDEPENDENT_AMBULATORY_CARE_PROVIDER_SITE_OTHER): Payer: Medicare Other | Admitting: *Deleted

## 2017-11-22 DIAGNOSIS — I480 Paroxysmal atrial fibrillation: Secondary | ICD-10-CM

## 2017-11-22 NOTE — Progress Notes (Signed)
Remote pacemaker transmission.   

## 2017-11-23 ENCOUNTER — Encounter: Payer: Self-pay | Admitting: Cardiology

## 2017-11-29 LAB — CUP PACEART REMOTE DEVICE CHECK
Battery Remaining Longevity: 129 mo
Brady Statistic RV Percent Paced: 58 %
Implantable Lead Implant Date: 20150610
Implantable Lead Location: 753859
Implantable Lead Location: 753860
Implantable Lead Model: 5076
Implantable Pulse Generator Implant Date: 20150610
Lead Channel Impedance Value: 490 Ohm
Lead Channel Pacing Threshold Amplitude: 1 V
Lead Channel Pacing Threshold Pulse Width: 0.4 ms
Lead Channel Sensing Intrinsic Amplitude: 12 mV
Lead Channel Setting Pacing Amplitude: 2.5 V
MDC IDC LEAD IMPLANT DT: 20150610
MDC IDC MSMT BATTERY REMAINING PERCENTAGE: 95.5 %
MDC IDC MSMT BATTERY VOLTAGE: 2.99 V
MDC IDC SESS DTM: 20190109090015
MDC IDC SET LEADCHNL RV PACING PULSEWIDTH: 0.4 ms
MDC IDC SET LEADCHNL RV SENSING SENSITIVITY: 2 mV
Pulse Gen Serial Number: 7627922

## 2017-12-02 ENCOUNTER — Emergency Department (HOSPITAL_COMMUNITY): Payer: Medicare Other

## 2017-12-02 ENCOUNTER — Emergency Department (HOSPITAL_COMMUNITY)
Admission: EM | Admit: 2017-12-02 | Discharge: 2017-12-02 | Disposition: A | Discharge status: Home / Self Care | Payer: Medicare Other | Attending: Emergency Medicine | Admitting: Emergency Medicine

## 2017-12-02 ENCOUNTER — Encounter (HOSPITAL_COMMUNITY): Payer: Self-pay

## 2017-12-02 DIAGNOSIS — I5032 Chronic diastolic (congestive) heart failure: Secondary | ICD-10-CM | POA: Insufficient documentation

## 2017-12-02 DIAGNOSIS — N390 Urinary tract infection, site not specified: Secondary | ICD-10-CM | POA: Diagnosis not present

## 2017-12-02 DIAGNOSIS — Z7901 Long term (current) use of anticoagulants: Secondary | ICD-10-CM | POA: Insufficient documentation

## 2017-12-02 DIAGNOSIS — F0390 Unspecified dementia without behavioral disturbance: Secondary | ICD-10-CM | POA: Diagnosis not present

## 2017-12-02 DIAGNOSIS — I130 Hypertensive heart and chronic kidney disease with heart failure and stage 1 through stage 4 chronic kidney disease, or unspecified chronic kidney disease: Secondary | ICD-10-CM | POA: Insufficient documentation

## 2017-12-02 DIAGNOSIS — E039 Hypothyroidism, unspecified: Secondary | ICD-10-CM | POA: Diagnosis not present

## 2017-12-02 DIAGNOSIS — R42 Dizziness and giddiness: Secondary | ICD-10-CM | POA: Insufficient documentation

## 2017-12-02 DIAGNOSIS — Z950 Presence of cardiac pacemaker: Secondary | ICD-10-CM | POA: Insufficient documentation

## 2017-12-02 DIAGNOSIS — N184 Chronic kidney disease, stage 4 (severe): Secondary | ICD-10-CM | POA: Insufficient documentation

## 2017-12-02 DIAGNOSIS — Z85828 Personal history of other malignant neoplasm of skin: Secondary | ICD-10-CM | POA: Insufficient documentation

## 2017-12-02 DIAGNOSIS — Z79899 Other long term (current) drug therapy: Secondary | ICD-10-CM | POA: Diagnosis not present

## 2017-12-02 DIAGNOSIS — W19XXXA Unspecified fall, initial encounter: Secondary | ICD-10-CM | POA: Diagnosis not present

## 2017-12-02 LAB — CBC WITH DIFFERENTIAL/PLATELET
BASOS ABS: 0 10*3/uL (ref 0.0–0.1)
Basophils Relative: 0 %
Eosinophils Absolute: 0 10*3/uL (ref 0.0–0.7)
Eosinophils Relative: 0 %
HEMATOCRIT: 43.2 % (ref 36.0–46.0)
HEMOGLOBIN: 14.1 g/dL (ref 12.0–15.0)
LYMPHS ABS: 0.5 10*3/uL — AB (ref 0.7–4.0)
LYMPHS PCT: 8 %
MCH: 31.5 pg (ref 26.0–34.0)
MCHC: 32.6 g/dL (ref 30.0–36.0)
MCV: 96.4 fL (ref 78.0–100.0)
Monocytes Absolute: 0.6 10*3/uL (ref 0.1–1.0)
Monocytes Relative: 10 %
NEUTROS ABS: 4.9 10*3/uL (ref 1.7–7.7)
NEUTROS PCT: 82 %
Platelets: 116 10*3/uL — ABNORMAL LOW (ref 150–400)
RBC: 4.48 MIL/uL (ref 3.87–5.11)
RDW: 13.9 % (ref 11.5–15.5)
WBC: 6 10*3/uL (ref 4.0–10.5)

## 2017-12-02 LAB — HEPATIC FUNCTION PANEL
ALBUMIN: 3 g/dL — AB (ref 3.5–5.0)
ALK PHOS: 130 U/L — AB (ref 38–126)
ALT: 37 U/L (ref 14–54)
AST: 42 U/L — ABNORMAL HIGH (ref 15–41)
BILIRUBIN INDIRECT: 1.1 mg/dL — AB (ref 0.3–0.9)
Bilirubin, Direct: 0.3 mg/dL (ref 0.1–0.5)
Total Bilirubin: 1.4 mg/dL — ABNORMAL HIGH (ref 0.3–1.2)
Total Protein: 6.2 g/dL — ABNORMAL LOW (ref 6.5–8.1)

## 2017-12-02 LAB — URINALYSIS, ROUTINE W REFLEX MICROSCOPIC
Bilirubin Urine: NEGATIVE
GLUCOSE, UA: NEGATIVE mg/dL
HGB URINE DIPSTICK: NEGATIVE
Ketones, ur: 5 mg/dL — AB
LEUKOCYTES UA: NEGATIVE
Nitrite: NEGATIVE
PROTEIN: NEGATIVE mg/dL
SPECIFIC GRAVITY, URINE: 1.017 (ref 1.005–1.030)
pH: 5 (ref 5.0–8.0)

## 2017-12-02 LAB — BASIC METABOLIC PANEL
ANION GAP: 13 (ref 5–15)
BUN: 29 mg/dL — AB (ref 6–20)
CHLORIDE: 100 mmol/L — AB (ref 101–111)
CO2: 24 mmol/L (ref 22–32)
Calcium: 10 mg/dL (ref 8.9–10.3)
Creatinine, Ser: 1.95 mg/dL — ABNORMAL HIGH (ref 0.44–1.00)
GFR calc Af Amer: 25 mL/min — ABNORMAL LOW (ref >60)
GFR calc non Af Amer: 21 mL/min — ABNORMAL LOW (ref >60)
GLUCOSE: 90 mg/dL (ref 65–99)
POTASSIUM: 4.3 mmol/L (ref 3.5–5.1)
Sodium: 137 mmol/L (ref 135–145)

## 2017-12-02 LAB — I-STAT TROPONIN, ED: TROPONIN I, POC: 0.04 ng/mL (ref 0.00–0.08)

## 2017-12-02 LAB — I-STAT CG4 LACTIC ACID, ED: LACTIC ACID, VENOUS: 1.28 mmol/L (ref 0.5–1.9)

## 2017-12-02 MED ORDER — SODIUM CHLORIDE 0.9 % IV BOLUS (SEPSIS)
500.0000 mL | Freq: Once | INTRAVENOUS | Status: AC
  Administered 2017-12-02: 500 mL via INTRAVENOUS

## 2017-12-02 NOTE — ED Notes (Signed)
[facility] called for transport.  

## 2017-12-02 NOTE — ED Notes (Addendum)
Patient picked up by [facility]

## 2017-12-02 NOTE — Discharge Instructions (Signed)
Please follow up with family doctor as needed. Return if worsening symptoms.

## 2017-12-02 NOTE — ED Triage Notes (Signed)
Pt presents for evaluation of fall. Unwitnessed fall from wheelchair. Pt reports she felt dizzy then fell forward onto floor. No LOC. Patient denies pain. Is on elliquis. Recently started taking levaquin for UTI x 3 days. Dementia at baseline but patient is oriented to normal.

## 2017-12-02 NOTE — ED Notes (Signed)
[facility], gave report to [place], med tech.

## 2017-12-02 NOTE — ED Provider Notes (Signed)
[facility] EMERGENCY DEPARTMENT Provider Note   CSN: [id number] Arrival date & time: 12/02/17  1411     History   Chief Complaint Chief Complaint  Patient presents with  . Fall    HPI [name] is a 84 y.o. female.  HPI [name] is a 84 y.o. female with hx of CAD,  Afib, CHF, HTN, presents to ED after a fall. Pt with frequent falls in the past, had a fall while at her SNF today. It was not witnessed by staff but witnessed by other residents who reported pt was sitting in her wheelchair and fell forward, hitting head on the floor. There was no LOC. According to SNF and EMS pt's mental status at baseline, however upon arrival to ED, pt appears more solmnolent than usual. Pt has no complaints, but very drowsy and not a reliable historian. She denies pain. No tx prior to coming in other than towel around pt's neck for c spine precautions.   Past Medical History:  Diagnosis Date  . Anemia   . Atrial fibrillation ([place])    a. Dx 01/2014->Eliquis started.  . Cancer ([place])   . Chest pain    a. 2011 Neg MV;  b. 01/2014 Cath: LM 20-30, LAD nl, D1 nl, LCX nl, OM1 nl, RCA dom 30-60m, PD/PL nl, EF 65%->Med Rx; c. 03/2016 MV: no ischemia/infarct. EF 63%.  . Degenerative joint disease   . Diastolic CHF, acute on chronic ([place]) 01/31/2012  . Dyslipidemia   . Dysrhythmia   . Family history of adverse reaction to anesthesia    " multiple family members have difficulty waking "  . GERD (gastroesophageal reflux disease)   . Hemorrhoid   . History of skin cancer    tafeen/ non melanoma.   Marland Kitchen Hx of varicella   . Hypertension   . Hypothyroidism   . Monoclonal gammopathies   . Pacemaker   . Palpitations    PVCs/bigeminy on event in May 2009 revealing this was relatively asymptomatic  . Right lower quadrant abdominal pain 07/19/2012   Recurrent  With nausea    This is new since she had ct for llq pain in [facility]    R/o appendiceal problem  Hernia  Get ct scan  And  plan  Fu     . Thrombocytopenia [facility])     Patient Active Problem List   Diagnosis Date Noted  . Chest pain of uncertain etiology 26/94/8546  . CAD (coronary artery disease) 07/24/2017  . CKD (chronic kidney disease), stage IV ([place]) 07/24/2017  . Dementia 07/24/2017  . Atrial fibrillation, chronic ([place]) 07/24/2017  . Monoclonal gammopathy of unknown significance (MGUS) 07/24/2017  . Thrombocytopenia, idiopathic ([place]) 07/24/2017  . Pulmonary HTN ([place]) 07/24/2017  . Mild mitral regurgitation 07/24/2017  . Idiopathic hypotension   . Fall 05/27/2017  . Mitral regurgitation 03/10/2017  . Mitral stenosis 03/10/2017  . Chest pain 03/08/2017  . Dehydration 11/18/2016  . Intractable nausea and vomiting 11/18/2016  . Generalized weakness 11/18/2016  . Hypokalemia 11/11/2016  . Hypertensive heart disease 04/01/2016  . PAF (paroxysmal atrial fibrillation) ([place]) 03/31/2016  . Chest pain with moderate risk for cardiac etiology, negative MI and Nuc study, + muscular skeletal   . Atypical chest pain 01/21/2016  . CHF exacerbation ([place]) 07/30/2015  . CKD (chronic kidney disease) stage 4, GFR 15-29 ml/min ([facility]) 05/09/2015  . Anemia of chronic disease 05/09/2015  . Dyslipidemia 05/09/2015  . Elevated troponin   . Iron deficiency anemia  11/21/2014  . Pacemaker 04/27/2014  . Chronic anticoagulation 02/25/2014  . Chronic diastolic CHF (congestive heart failure) ([place]) 02/24/2014  . Atrial fibrillation ([place]) 01/29/2014  . MGUS (monoclonal gammopathy of unknown significance) 08/01/2013  . Thrombocytopenia ([place])   . Hypothyroidism 02/19/2012  . Hyperlipidemia 12/15/2008  . HYPERTENSION, BENIGN 12/15/2008    Past Surgical History:  Procedure Laterality Date  . CARDIOVERSION N/A 02/26/2014   Procedure: CARDIOVERSION AT BEDSIDE;  Surgeon: Pixie Casino, MD;  Location: [facility];  Service: Cardiovascular;  Laterality: N/A;  . CARDIOVERSION N/A 04/22/2014   Procedure: CARDIOVERSION (BEDSIDE);  Surgeon:  [name], MD;  Location: [facility] OR;  Service: Cardiovascular;  Laterality: N/A;  . CATARACT EXTRACTION     Bilateral  implantt  . CESAREAN SECTION     times 2  . CHOLECYSTECTOMY  1999  . ESOPHAGOGASTRODUODENOSCOPY N/A 07/06/2014   Procedure: ESOPHAGOGASTRODUODENOSCOPY (EGD);  Surgeon: Ladene Artist, MD;  Location: [facility] ENDOSCOPY;  Service: Endoscopy;  Laterality: N/A;  . INSERT / REPLACE / REMOVE PACEMAKER    . LEFT HEART CATHETERIZATION WITH CORONARY ANGIOGRAM N/A 01/29/2014   Procedure: LEFT HEART CATHETERIZATION WITH CORONARY ANGIOGRAM;  Surgeon: Blane Ohara, MD;  Location[facility] C[facility] LAB;  Service: Cardiovascular;  Laterality: N/A;  . PACEMAKER INSERTION  04/23/2014   STJ Assurity dual chamber pacemaker implanted by Dr Rayann Heman  . PERMANENT PACEMAKER INSERTION N/A 04/23/2014   Procedure: PERMANENT PACEMAKER INSERTION;  Surgeon: Evans Lance, MD;  Location: [facility] CATH LAB;  Service: Cardiovascular;  Laterality: N/A;  . RIGHT HEART CATHETERIZATION N/A 09/22/2014   Procedure: RIGHT HEART CATH;  Surgeon: Jolaine Artist, MD;  Location: [facility] CATH LAB;  Service: Cardiovascular;  Laterality: N/A;  . SHOULDER SURGERY  1996   Right     OB History    Gravida Para Term Preterm AB Living   3 3 3          SAB TAB Ectopic Multiple Live Births                   Home Medications    Prior to Admission medications   Medication Sig Start Date End Date Taking? Authorizing Provider  acetaminophen (TYLENOL) 325 MG tablet Take 650 mg by mouth every 4 (four) hours as needed (for pain).     [provider]  amiodarone (PACERONE) 200 MG tablet Take 1 tablet (200 mg total) by mouth daily. 11/25/16   Bensimhon, Shaune Pascal, MD  ARIPiprazole (ABILIFY) 5 MG tablet Take 5 mg by mouth daily.    [provider]  bisacodyl (DULCOLAX) 10 MG suppository Place 10 mg rectally as needed for moderate constipation.    [provider]  citalopram (CELEXA) 20 MG tablet Take 20 mg by mouth daily.     [provider]  docusate sodium (COLACE) 50 MG capsule Take 50 mg by mouth daily.    [provider]  ELIQUIS 2.5 MG TABS tablet TAKE ONE TABLET BY MOUTH TWICE DAILY Patient taking differently: Take 2.5 mg by mouth two times a day 05/22/17   Bensimhon, Shaune Pascal, MD  Ferrous Sulfate (IRON) 325 (65 Fe) MG TABS Take 1 tablet by mouth daily. Patient taking differently: Take 325 mg by mouth daily.  07/01/16   Johnson, Clanford L, MD  lactose free nutrition (BOOST) LIQD Take 237 mLs by mouth 2 (two) times daily between meals.    [provider]  levothyroxine (SYNTHROID, LEVOTHROID) 75 MCG tablet TAKE 1 TABLET BY MOUTH ONCE DAILY Patient  taking differently: TAKE 75MCG BY MOUTH ONCE DAILY 05/22/17   Panosh, Standley Brooking, MD  loratadine (CLARITIN) 10 MG tablet Take 1 tablet (10 mg total) by mouth daily. 03/10/17   Jani Gravel, MD  LORazepam (ATIVAN) 0.5 MG tablet Take 0.5 mg by mouth every 12 (twelve) hours as needed for anxiety.     [provider]  magnesium hydroxide (MILK OF MAGNESIA) 400 MG/5ML suspension Take 30 mLs by mouth daily as needed for mild constipation.    [provider]  meclizine (ANTIVERT) 12.5 MG tablet Take 1 tablet (12.5 mg total) by mouth 2 (two) times daily as needed for dizziness. 09/14/16   Bensimhon, Shaune Pascal, MD  metolazone (ZAROXOLYN) 2.5 MG tablet Take 1 tablet (2.5 mg total) by mouth as needed (for weight 134 lb or greater). Patient taking differently: Take 2.5 mg by mouth as needed (for weight 134 lb or greater). Take with potassium 20 meq of weight is gain greater than 134 lbs 03/09/16   Bensimhon, Shaune Pascal, MD  nitroGLYCERIN (NITROSTAT) 0.4 MG SL tablet Place 1 tablet (0.4 mg total) under the tongue every 5 (five) minutes as needed. For chest pain 05/06/16   Bensimhon, Shaune Pascal, MD  pantoprazole (PROTONIX) 40 MG tablet Take 1 tablet (40 mg total) by mouth daily. 08/19/17   Duffy Bruce, MD  polyethylene glycol [facility]) packet Take 17 g by  mouth daily. Mix with 8 ounces of liquid. 08/13/17   Malvin Johns, MD  potassium chloride SA (K-DUR,KLOR-CON) 20 MEQ tablet Take 1 tablet (20 mEq total) by mouth daily. Take an additional 34meq with Metolazone. Patient taking differently: Take 20 mEq by mouth daily as needed. Take with Metolazone 2.5 if weight is greater than 134 lbs 06/09/17   Pattricia Boss, MD  simvastatin (ZOCOR) 10 MG tablet TAKE ONE TABLET BY MOUTH AT BEDTIME Patient taking differently: TAKE 10MG  BY MOUTH AT BEDTIME 02/14/17   Larey Dresser, MD  torsemide (DEMADEX) 20 MG tablet Take 1 tablet (20 mg total) by mouth daily. Please start on 11/22/2016 Patient taking differently: Take 20 mg by mouth every other day.  06/09/17   Pattricia Boss, MD    Family History Family History  Problem Relation Age of Onset  . Coronary artery disease Father   . Heart disease Father   . Heart attack Father   . Alzheimer's disease Mother   . Lung cancer Brother 31  . Lung cancer Unknown     Social History Social History   Tobacco Use  . Smoking status: Never Smoker  . Smokeless tobacco: Never Used  Substance Use Topics  . Alcohol use: No  . Drug use: No     Allergies   Other; Tramadol; and Ibuprofen   Review of Systems Review of Systems  Unable to perform ROS: Mental status change     Physical Exam Updated Vital Signs BP (!) 94/45 (BP Location: Right Arm)   Pulse 82   Temp 98.2 F (36.8 C) (Oral)   Resp 16   SpO2 100%   Physical Exam  Constitutional: She appears well-developed and well-nourished. No distress.  Solmonlent, difficult to awake. Wakes up to sternal rub   HENT:  Head: Normocephalic.  Eyes: Conjunctivae and EOM are normal. Pupils are equal, round, and reactive to light.  Neck: Neck supple.  Cardiovascular: Normal rate, regular rhythm and normal heart sounds.  Pulmonary/Chest: Effort normal and breath sounds normal. No respiratory distress. She has no wheezes. She has no rales.  Abdominal: Soft.  Bowel sounds are normal. She exhibits no distension. There is no tenderness. There is no rebound.  Musculoskeletal: She exhibits no edema.  Neurological: She is alert.  Follows simple commands. Able grip strength 5/5 equal bilaterally. No pronator drift. Moving bilateral LE.   Skin: Skin is warm and dry.  Psychiatric: She has a normal mood and affect. Her behavior is normal.  Nursing note and vitals reviewed.    ED Treatments / Results  Labs (all labs ordered are listed, but only abnormal results are displayed) Labs Reviewed  CBC WITH DIFFERENTIAL/PLATELET - Abnormal; Notable for the following components:      Result Value   Platelets 116 (*)    Lymphs Abs 0.5 (*)    All other components within normal limits  BASIC METABOLIC PANEL - Abnormal; Notable for the following components:   Chloride 100 (*)    BUN 29 (*)    Creatinine, Ser 1.95 (*)    GFR calc non Af Amer 21 (*)    GFR calc Af Amer 25 (*)    All other components within normal limits  URINALYSIS, ROUTINE W REFLEX MICROSCOPIC - Abnormal; Notable for the following components:   Ketones, ur 5 (*)    All other components within normal limits  HEPATIC FUNCTION PANEL - Abnormal; Notable for the following components:   Total Protein 6.2 (*)    Albumin 3.0 (*)    AST 42 (*)    Alkaline Phosphatase 130 (*)    Total Bilirubin 1.4 (*)    Indirect Bilirubin 1.1 (*)    All other components within normal limits  CULTURE, BLOOD (ROUTINE X 2)  CULTURE, BLOOD (ROUTINE X 2)  URINE CULTURE  I-STAT TROPONIN, ED  I-STAT CG4 LACTIC ACID, ED    EKG  EKG Interpretation  Date/Time:  Saturday December 02 2017 15:15:34 EST Ventricular Rate:  85 PR Interval:    QRS Duration: 136 QT Interval:  503 QTC Calculation: 570 R Axis:   82 Text Interpretation:  Ventricular-paced complexes No further rhythm analysis attempted due to paced rhythm Nonspecific intraventricular conduction delay Abnormal T, consider ischemia, lateral leads Confirmed  by Nat Christen [id number]) on 12/02/2017 5:40:26 PM       Radiology Dg Chest 2 View  Result Date: 12/02/2017 CLINICAL DATA:  84 year old female with history of altered mental status. EXAM: CHEST  2 VIEW COMPARISON:  Chest x-ray 10/19/2017. FINDINGS: Lung volumes are low. Diffuse interstitial prominence, similar to prior studies. Widespread peribronchial cuffing also unchanged. Elevation of left hemidiaphragm is stable. No definite consolidative airspace disease. No pleural effusions. No evidence of pulmonary edema. Heart size is mildly enlarged (unchanged). Upper mediastinal contours are within normal limits. Aortic atherosclerosis. Right-sided pacemaker device in place with lead tips projecting over the expected location of the right atrium and right ventricular apex. Soft tissue anchor in the right proximal humerus incidentally noted, presumably from prior rotator cuff repair. IMPRESSION: 1. Low lung volumes with chronic changes suggestive of underlying interstitial lung disease, similar to prior examinations, as above. No radiographic evidence of acute cardiopulmonary disease. 2. Mild cardiomegaly. 3. Aortic atherosclerosis. Electronically Signed   By: Vinnie Langton M.D.   On: 12/02/2017 18:00   Ct Head Wo Contrast  Result Date: 12/02/2017 CLINICAL DATA:  84 year old female with history of unwitnessed fall from a wheelchair. Dizziness. No loss of consciousness. EXAM: CT HEAD WITHOUT CONTRAST CT CERVICAL SPINE WITHOUT CONTRAST TECHNIQUE: Multidetector CT imaging of the head and cervical spine was performed following the standard protocol without  intravenous contrast. Multiplanar CT image reconstructions of the cervical spine were also generated. COMPARISON:  Head CT 11/15/2017.  Cervical spine CT 11/01/2017. FINDINGS: CT HEAD FINDINGS Brain: Mild cerebral atrophy. Patchy and confluent areas of decreased attenuation are noted throughout the deep and periventricular white matter of the cerebral hemispheres  bilaterally, compatible with chronic microvascular ischemic disease. No evidence of acute infarction, hemorrhage, hydrocephalus, extra-axial collection or mass lesion/mass effect. Vascular: No hyperdense vessel or unexpected calcification. Skull: Normal. Negative for fracture or focal lesion. Sinuses/Orbits: No acute finding. Other: None. CT CERVICAL SPINE FINDINGS Alignment: Normal. Skull base and vertebrae: No acute fracture. No primary bone lesion or focal pathologic process. Soft tissues and spinal canal: No prevertebral fluid or swelling. No visible canal hematoma. Disc levels: Multilevel degenerative disc disease, most severe at C5-C6 and C6-C7. Severe multilevel facet arthropathy. Upper chest: Unremarkable. Other: None. IMPRESSION: 1. No evidence of significant acute traumatic injury to the skull, brain or cervical spine. 2. Mild cerebral atrophy with chronic microvascular ischemic changes in the cerebral white matter, as above. 3. Multilevel degenerative disc disease and cervical spondylosis, as above. Electronically Signed   By: Vinnie Langton M.D.   On: 12/02/2017 17:05   Ct Cervical Spine Wo Contrast  Result Date: 12/02/2017 CLINICAL DATA:  84 year old female with history of unwitnessed fall from a wheelchair. Dizziness. No loss of consciousness. EXAM: CT HEAD WITHOUT CONTRAST CT CERVICAL SPINE WITHOUT CONTRAST TECHNIQUE: Multidetector CT imaging of the head and cervical spine was performed following the standard protocol without intravenous contrast. Multiplanar CT image reconstructions of the cervical spine were also generated. COMPARISON:  Head CT 11/15/2017.  Cervical spine CT 11/01/2017. FINDINGS: CT HEAD FINDINGS Brain: Mild cerebral atrophy. Patchy and confluent areas of decreased attenuation are noted throughout the deep and periventricular white matter of the cerebral hemispheres bilaterally, compatible with chronic microvascular ischemic disease. No evidence of acute infarction,  hemorrhage, hydrocephalus, extra-axial collection or mass lesion/mass effect. Vascular: No hyperdense vessel or unexpected calcification. Skull: Normal. Negative for fracture or focal lesion. Sinuses/Orbits: No acute finding. Other: None. CT CERVICAL SPINE FINDINGS Alignment: Normal. Skull base and vertebrae: No acute fracture. No primary bone lesion or focal pathologic process. Soft tissues and spinal canal: No prevertebral fluid or swelling. No visible canal hematoma. Disc levels: Multilevel degenerative disc disease, most severe at C5-C6 and C6-C7. Severe multilevel facet arthropathy. Upper chest: Unremarkable. Other: None. IMPRESSION: 1. No evidence of significant acute traumatic injury to the skull, brain or cervical spine. 2. Mild cerebral atrophy with chronic microvascular ischemic changes in the cerebral white matter, as above. 3. Multilevel degenerative disc disease and cervical spondylosis, as above. Electronically Signed   By: Vinnie Langton M.D.   On: 12/02/2017 17:05    Procedures Procedures (including critical care time)  Medications Ordered in ED Medications  sodium chloride 0.9 % bolus 500 mL (not administered)     Initial Impression / Assessment and Plan / ED Course  I have reviewed the triage vital signs and the nursing notes.  Pertinent labs & imaging results that were available during my care of the patient were reviewed by me and considered in my medical decision making (see chart for details).     Pt in ED after a fall out of her wheelchair. Pt in Eliquis. Hx of frequent falls in the past. Will get CT head and cervical spine. Upon arrival to ED, pt very solmnolent, only wakes up to painful stimuli but appears to be neurovascularly intact otherwise. BP dropped to  34K systolic. Question sepsis? BP rechecked few times and is as high as 876 systolic. Will give fluid bolus, labs including blood cultures and lactic acid ordered. Will get UA, apparently currently being treated for  UTI on levaquin.   7:21 PM Labs all unremarkable, Normal white blood cell count, negative troponin, negative lactic acid.  Chest x-ray with no significant findings.  Urine with no signs of infection.  CT head and cervical spine negative.  Blood cultures were sent due to patient being hypotensive earlier.  Patient's blood pressure has been normal over the last several hours.  She is alert and oriented.  She is moving all extremities.  She does not ambulate.  Plan to discharge home back to the facility.  Follow-up as needed.  Vitals:   12/02/17 1535 12/02/17 1630 12/02/17 1730 12/02/17 1900  BP:  110/81 (!) 103/54 98/75  Pulse: 70 72  62  Resp: (!) 22 17 19 15   Temp: (!) 96.2 F (35.7 C)     TempSrc: Rectal     SpO2: 100% 100%  95%     Final Clinical Impressions(s) / ED Diagnoses   Final diagnoses:  Fall, initial encounter    ED Discharge Orders    None       Janee Morn 12/02/17 2018    Tanna Furry, MD 12/06/17 1719

## 2017-12-03 LAB — URINE CULTURE: CULTURE: NO GROWTH

## 2017-12-07 LAB — CULTURE, BLOOD (ROUTINE X 2)
CULTURE: NO GROWTH
Culture: NO GROWTH
Special Requests: ADEQUATE

## 2018-01-12 DEATH — deceased

## 2018-01-22 IMAGING — DX DG CHEST 2V
2 series · 2 of 2 positions shown · non-contrast
Comparison: Chest radiograph performed 06/30/2016

CLINICAL DATA: Acute onset of headache and generalized chest pain.
Status post pacemaker placement. Initial encounter.

EXAM:
CHEST  2 VIEW

[chest pa]
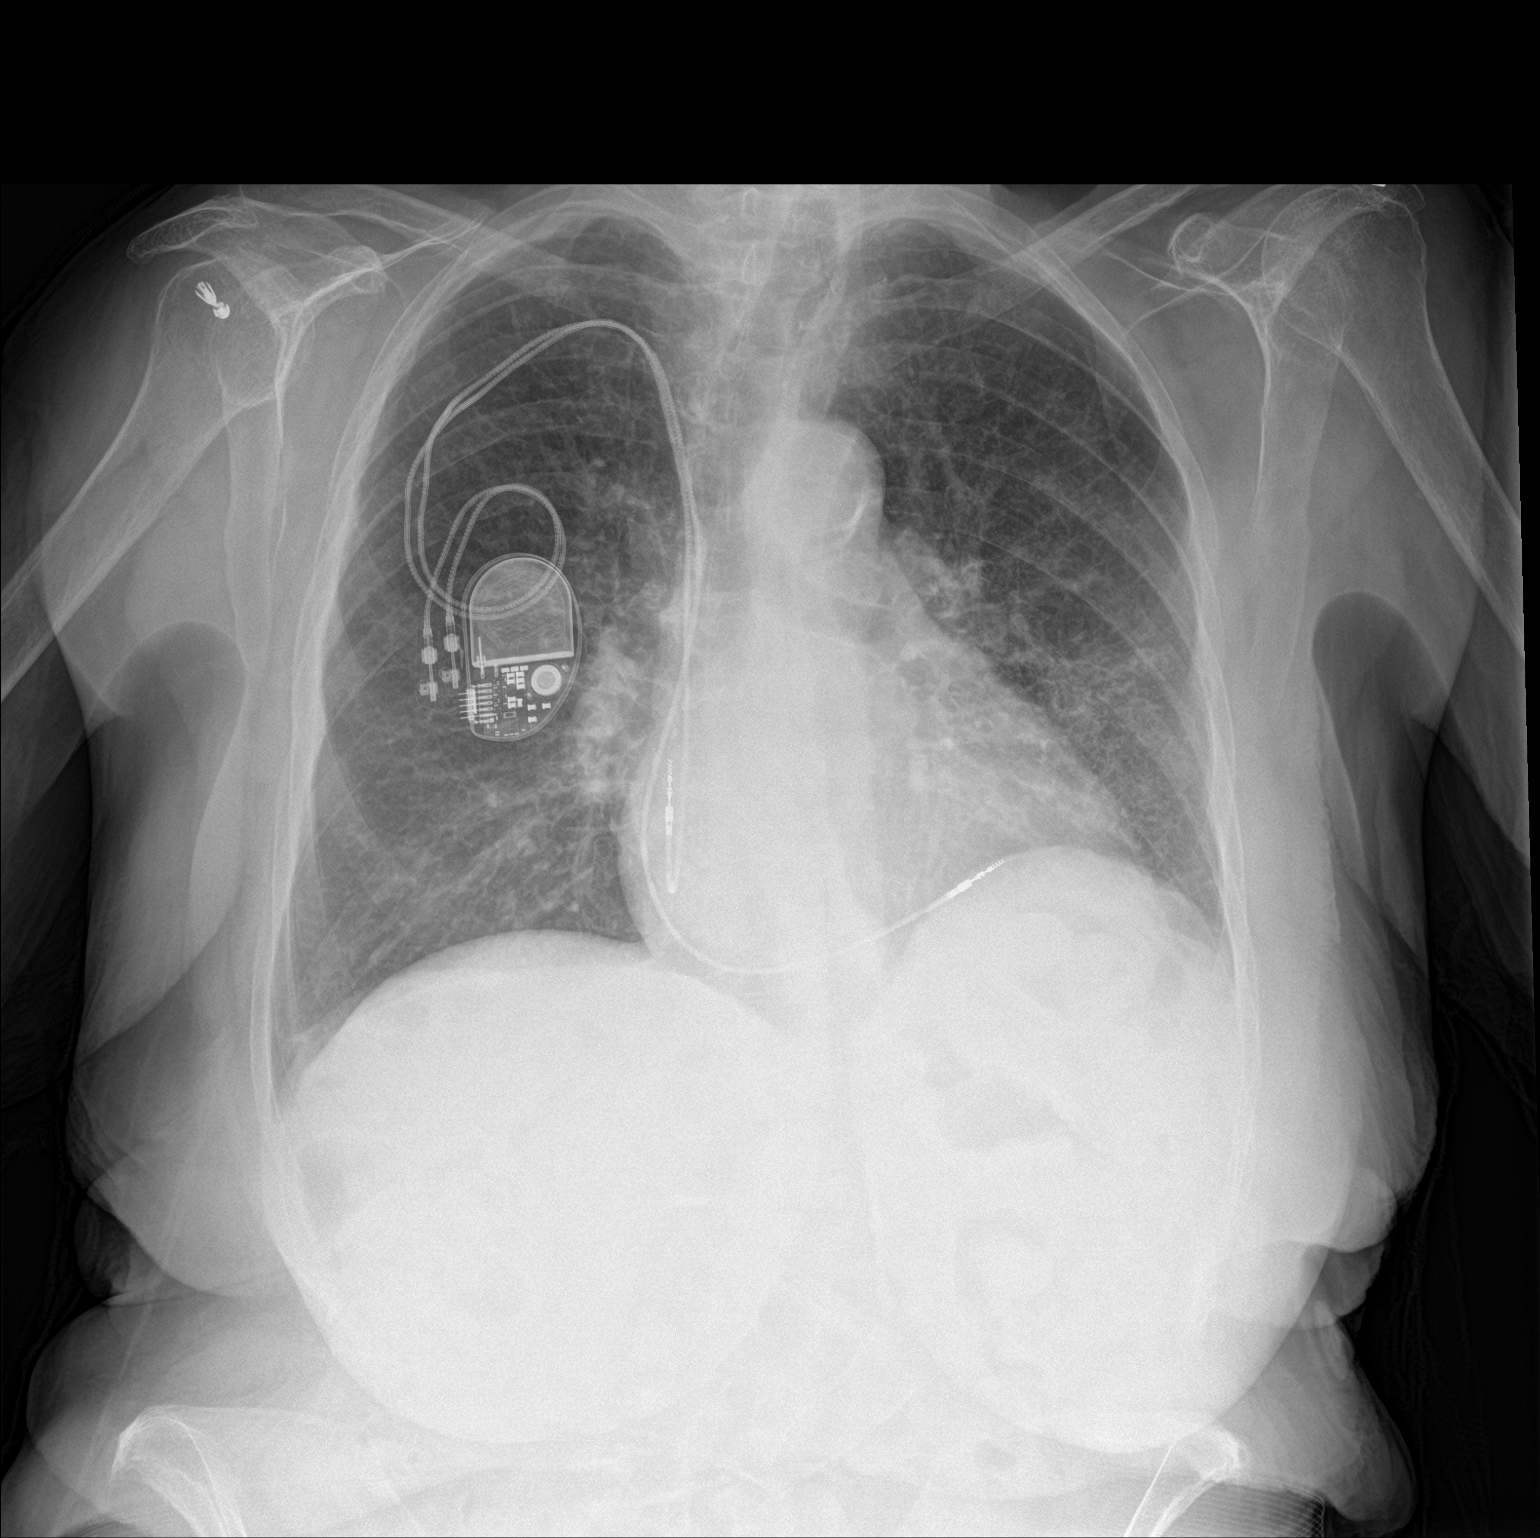

[chest lat]
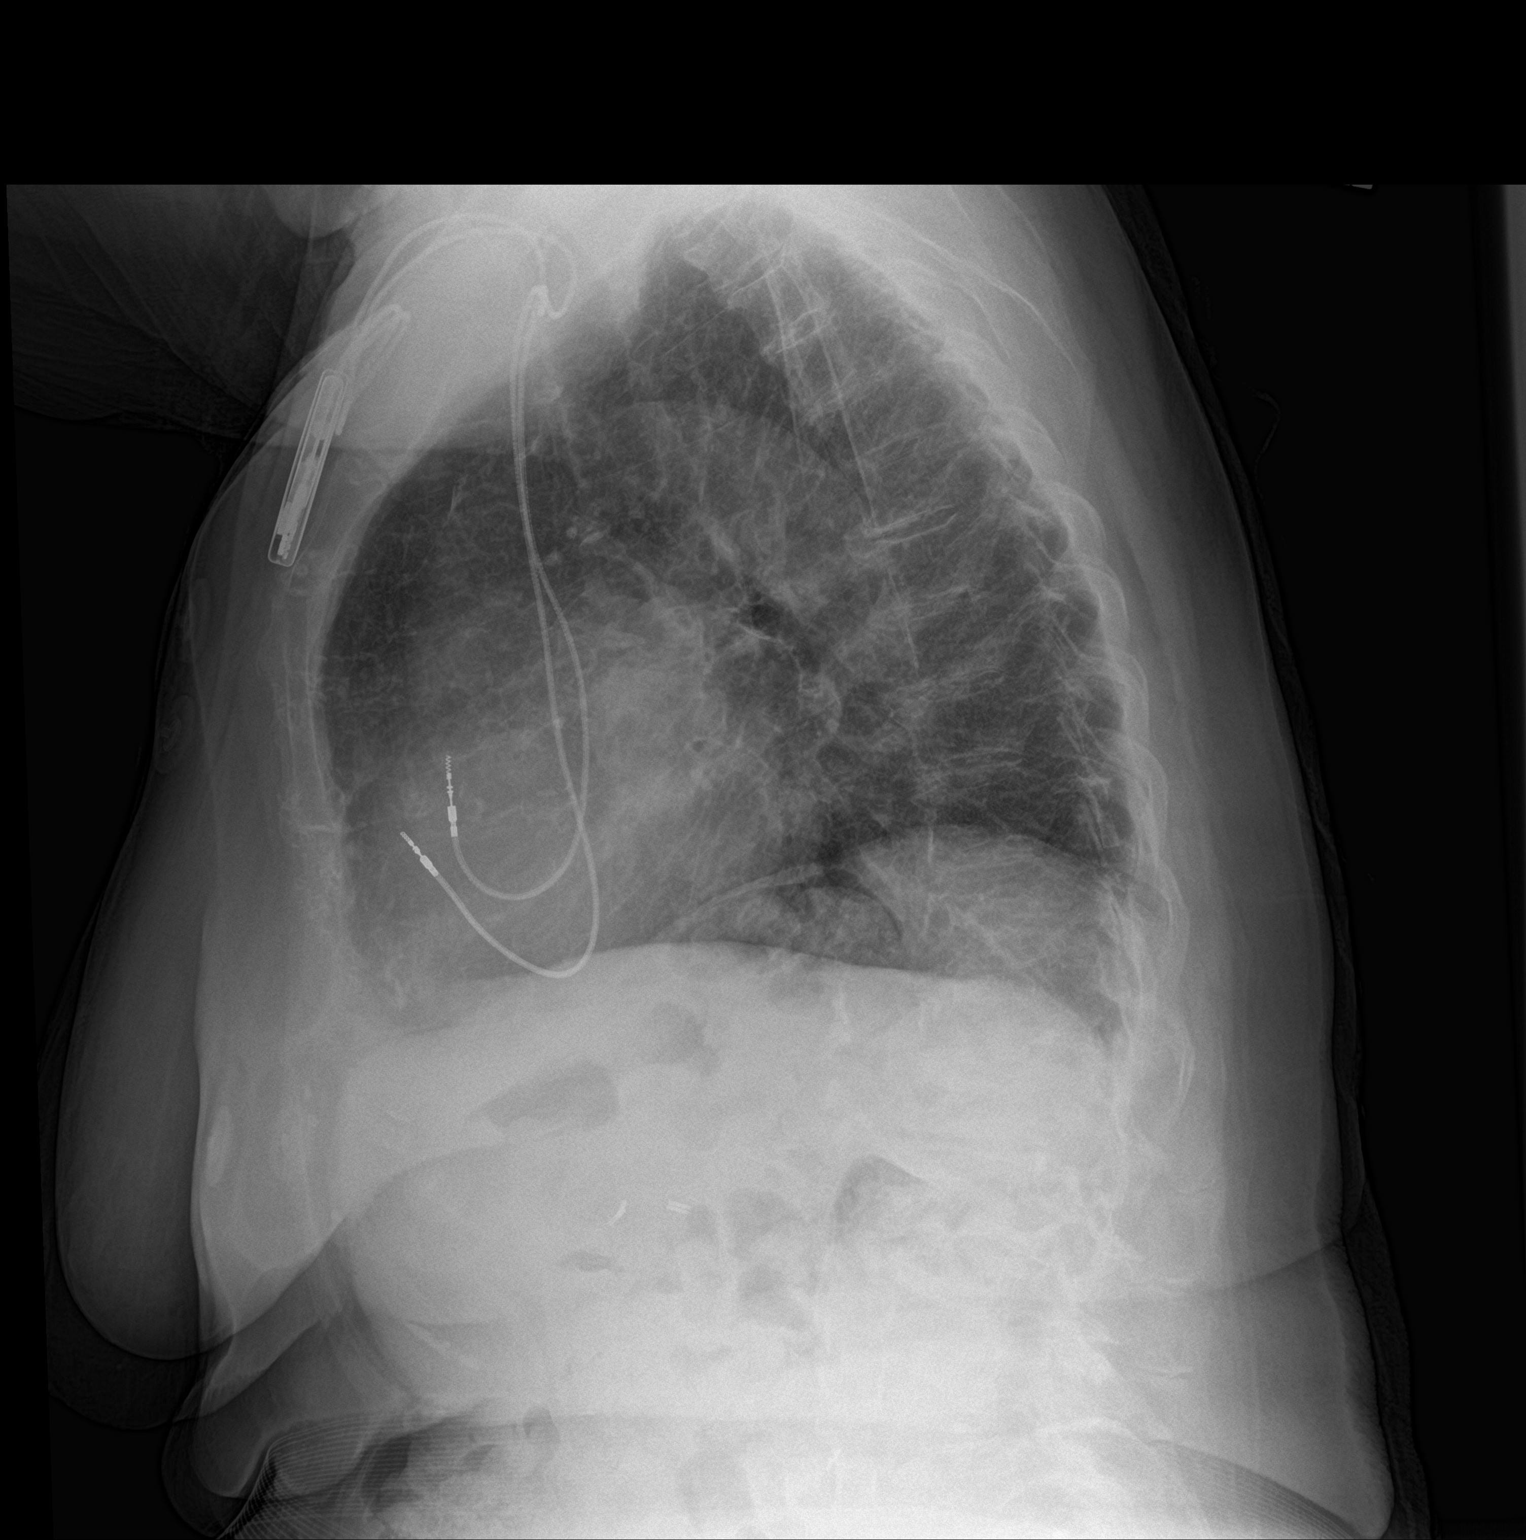

[2 of 2 positions shown; findings below may reference images not displayed]

FINDINGS: The lungs are well-aerated. Peribronchial thickening is noted.
Chronic interstitial changes are again noted, similar in appearance
to the prior study. No pleural effusion or pneumothorax is seen.

The heart is borderline normal in size. A pacemaker is noted at the
right chest wall, with leads ending at the right atrium and right
ventricle. No acute osseous abnormalities are seen. The patient is
status post right-sided rotator cuff repair.
IMPRESSION: 1. No acute cardiopulmonary process seen.
2. Chronic interstitial changes again noted, concerning for
interstitial lung disease or fibrosis.

## 2018-02-21 ENCOUNTER — Encounter: Payer: Self-pay | Admitting: *Deleted

## 2018-02-21 ENCOUNTER — Telehealth: Payer: Self-pay | Admitting: Cardiology

## 2018-02-21 NOTE — Telephone Encounter (Signed)
Confirmed remote transmission w/ pt daughter. Pt daughter informed me that pt passed away on 2018-01-05

## 2018-02-22 ENCOUNTER — Encounter: Payer: Self-pay | Admitting: Cardiology

## 2018-04-25 IMAGING — CR DG CHEST 2V
2 series · 2 of 2 positions shown · non-contrast
Comparison: Multiple chest radiographs dating back to 03/31/2016

CLINICAL DATA: 89-year-old female with chest pain and nausea.

EXAM:
CHEST  2 VIEW

[chest lat]
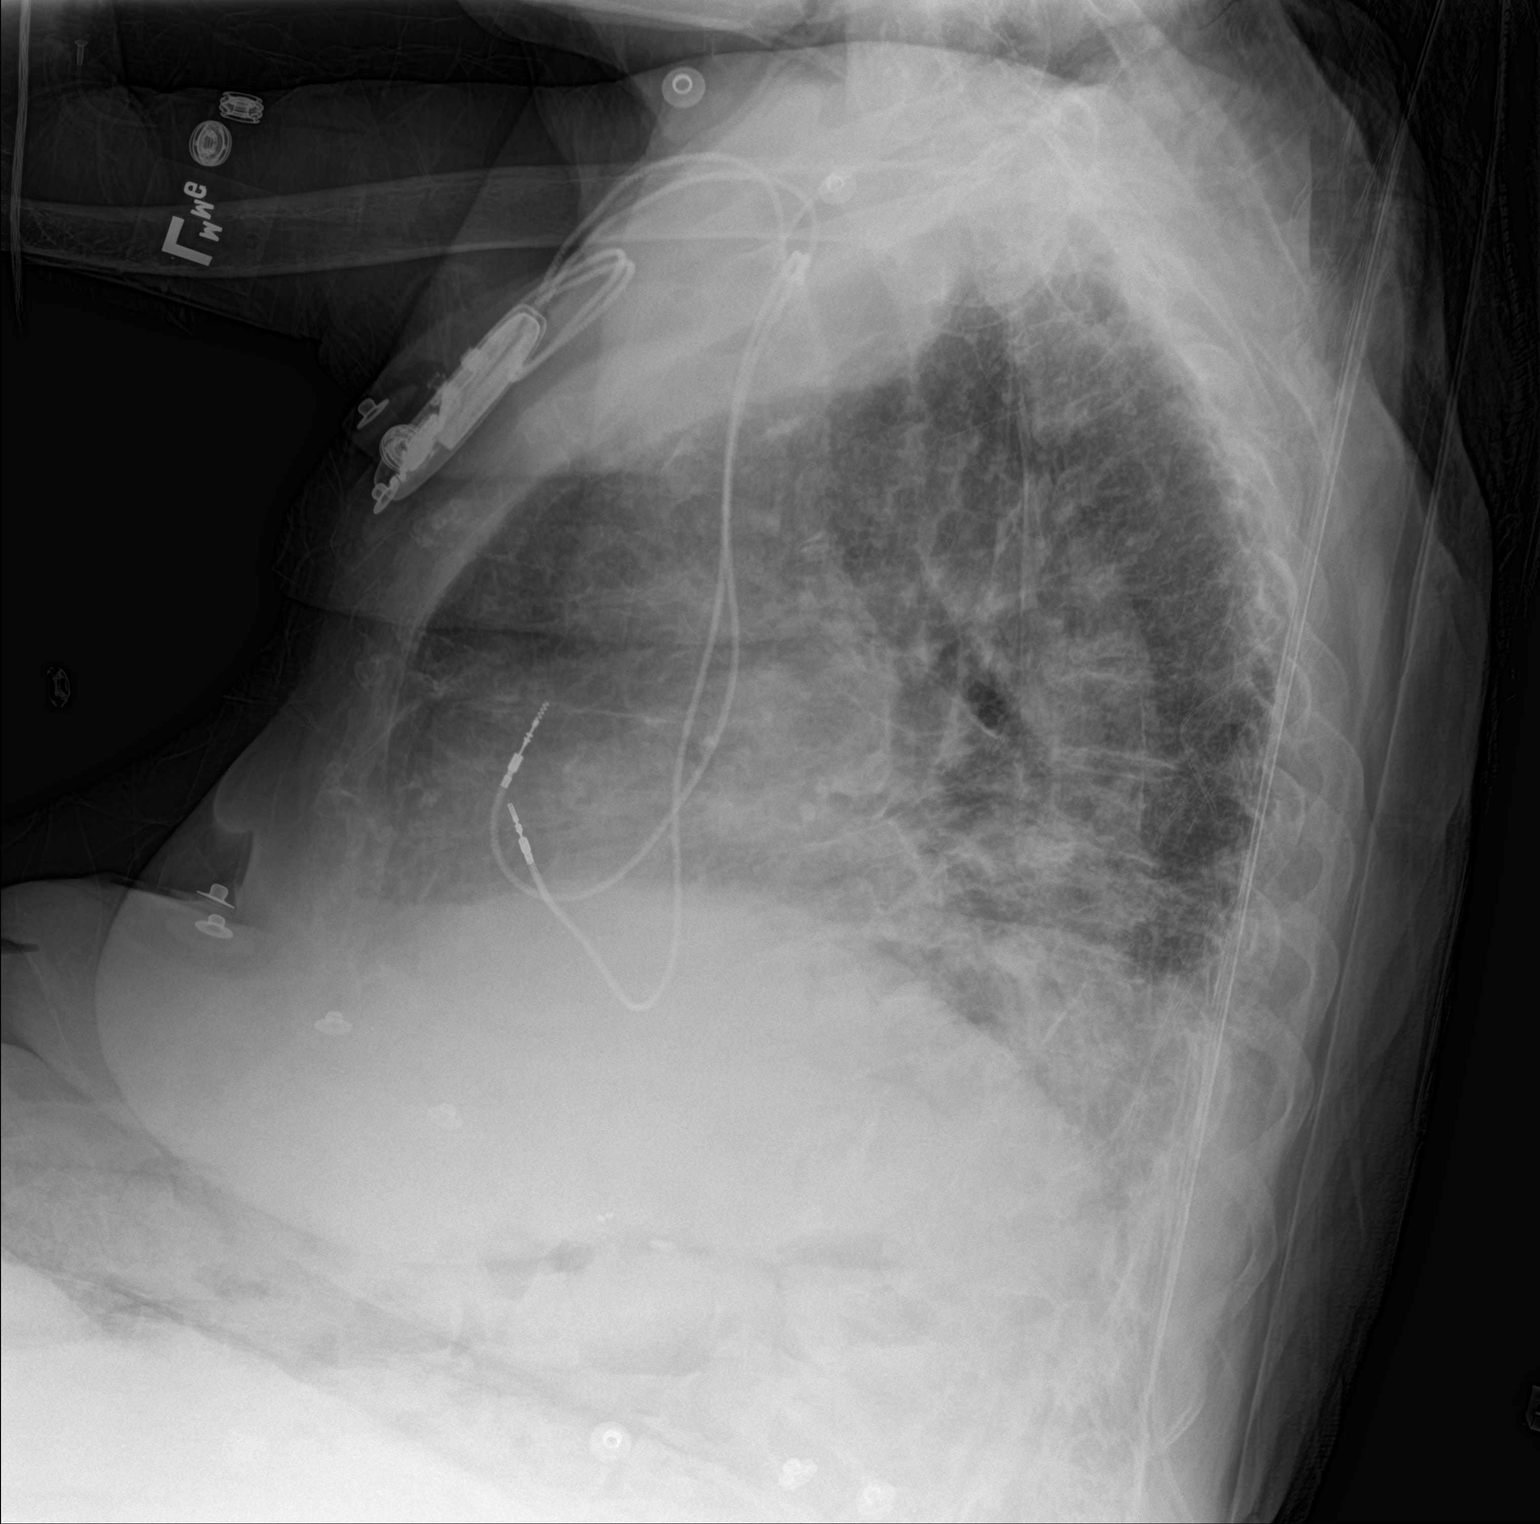

[chest ap]
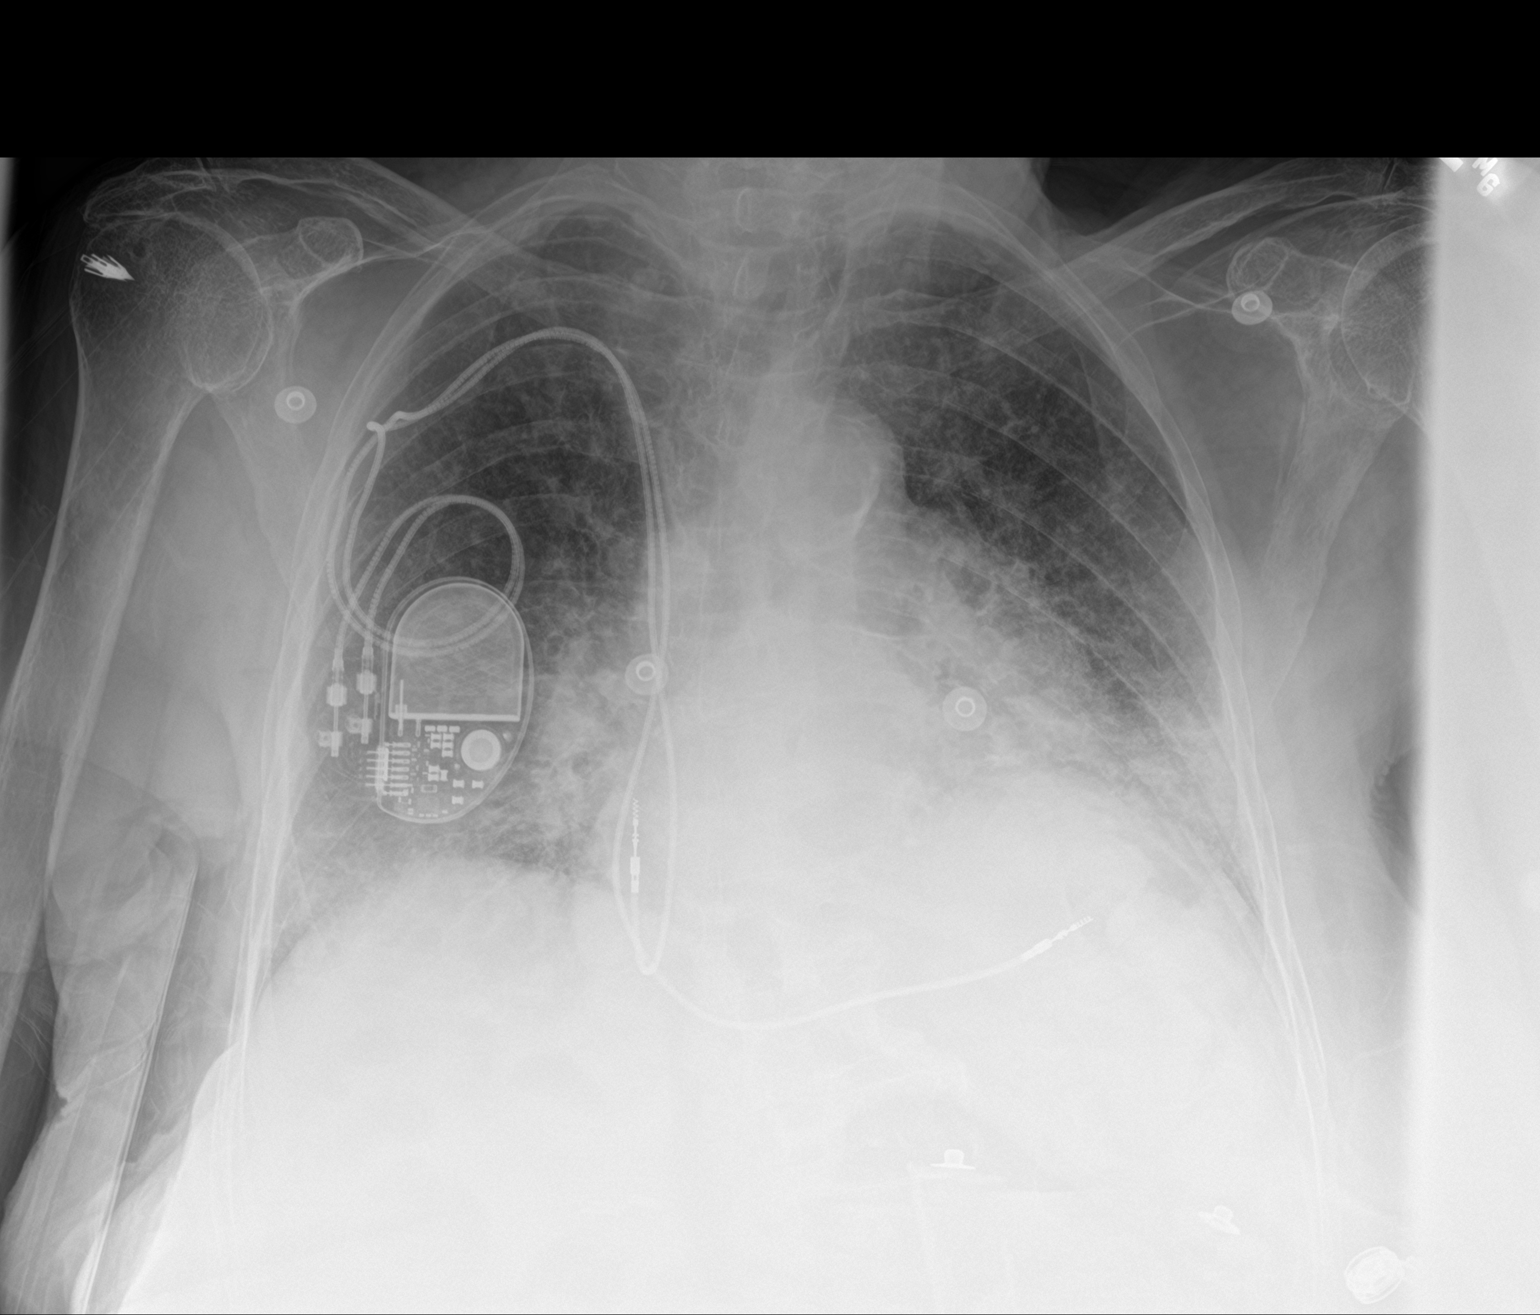

[2 of 2 positions shown; findings below may reference images not displayed]

FINDINGS: Mild cardiomegaly with hilar prominence and perihilar haziness
compatible with vascular congestion. No definite interstitial edema.
Bibasilar, left greater right hazy airspace densities may represent
atelectatic changes or developing infiltrate. There is no pleural
effusion or pneumothorax. Right pectoral dual lead pacemaker device
noted. There is focal area of slight kink in the pacemaker wires in
the right intercostal space. The wires however remains intact. There
is atherosclerotic calcification of the aortic arch. There is
osteopenia with degenerative changes of the spine. No acute
fracture.
IMPRESSION: Cardiomegaly with vascular congestion. Bilateral lower lung field
hazy densities may represent atelectasis versus infiltrate. Clinical
correlation is recommended.
